# Patient Record
Sex: Female | Born: 1945
Health system: Southern US, Community
[De-identification: ages and names within clinical notes are randomized; demographics above are authoritative.]

## PROBLEM LIST (undated history)

## (undated) DIAGNOSIS — R1310 Dysphagia, unspecified: Secondary | ICD-10-CM

## (undated) DIAGNOSIS — F32A Depression, unspecified: Secondary | ICD-10-CM

## (undated) DIAGNOSIS — F0390 Unspecified dementia without behavioral disturbance: Secondary | ICD-10-CM

## (undated) DIAGNOSIS — E119 Type 2 diabetes mellitus without complications: Secondary | ICD-10-CM

## (undated) DIAGNOSIS — I639 Cerebral infarction, unspecified: Secondary | ICD-10-CM

## (undated) DIAGNOSIS — G2401 Drug induced subacute dyskinesia: Secondary | ICD-10-CM

## (undated) DIAGNOSIS — D649 Anemia, unspecified: Secondary | ICD-10-CM

## (undated) DIAGNOSIS — F419 Anxiety disorder, unspecified: Secondary | ICD-10-CM

## (undated) DIAGNOSIS — R0989 Other specified symptoms and signs involving the circulatory and respiratory systems: Secondary | ICD-10-CM

## (undated) DIAGNOSIS — G912 (Idiopathic) normal pressure hydrocephalus: Secondary | ICD-10-CM

## (undated) DIAGNOSIS — F329 Major depressive disorder, single episode, unspecified: Secondary | ICD-10-CM

## (undated) DIAGNOSIS — W19XXXA Unspecified fall, initial encounter: Secondary | ICD-10-CM

## (undated) DIAGNOSIS — R413 Other amnesia: Secondary | ICD-10-CM

## (undated) DIAGNOSIS — I8290 Acute embolism and thrombosis of unspecified vein: Secondary | ICD-10-CM

## (undated) DIAGNOSIS — R4702 Dysphasia: Secondary | ICD-10-CM

## (undated) DIAGNOSIS — H3320 Serous retinal detachment, unspecified eye: Secondary | ICD-10-CM

## (undated) DIAGNOSIS — R296 Repeated falls: Secondary | ICD-10-CM

## (undated) DIAGNOSIS — I10 Essential (primary) hypertension: Secondary | ICD-10-CM

## (undated) DIAGNOSIS — K219 Gastro-esophageal reflux disease without esophagitis: Secondary | ICD-10-CM

## (undated) DIAGNOSIS — E785 Hyperlipidemia, unspecified: Secondary | ICD-10-CM

## (undated) DIAGNOSIS — Z85118 Personal history of other malignant neoplasm of bronchus and lung: Secondary | ICD-10-CM

## (undated) DIAGNOSIS — J449 Chronic obstructive pulmonary disease, unspecified: Secondary | ICD-10-CM

## (undated) HISTORY — PX: VENTRICULOPERITONEAL SHUNT: SHX204

## (undated) HISTORY — DX: Acute embolism and thrombosis of unspecified vein: I82.90

## (undated) HISTORY — PX: LUNG REMOVAL, PARTIAL: SHX233

## (undated) HISTORY — DX: Depression, unspecified: F32.A

## (undated) HISTORY — DX: Anxiety disorder, unspecified: F41.9

## (undated) HISTORY — DX: Type 2 diabetes mellitus without complications: E11.9

## (undated) HISTORY — DX: Serous retinal detachment, unspecified eye: H33.20

## (undated) HISTORY — DX: Major depressive disorder, single episode, unspecified: F32.9

## (undated) HISTORY — DX: Other amnesia: R41.3

## (undated) HISTORY — DX: Unspecified dementia, unspecified severity, without behavioral disturbance, psychotic disturbance, mood disturbance, and anxiety: F03.90

## (undated) HISTORY — DX: Personal history of other malignant neoplasm of bronchus and lung: Z85.118

## (undated) HISTORY — DX: Cerebral infarction, unspecified: I63.9

## (undated) HISTORY — DX: Dysphasia: R47.02

## (undated) HISTORY — DX: Hyperlipidemia, unspecified: E78.5

## (undated) HISTORY — DX: Chronic obstructive pulmonary disease, unspecified: J44.9

## (undated) HISTORY — DX: Other specified symptoms and signs involving the circulatory and respiratory systems: R09.89

## (undated) HISTORY — DX: (Idiopathic) normal pressure hydrocephalus: G91.2

---

## 2012-12-01 LAB — HM PAP SMEAR: HM Pap smear: NEGATIVE

## 2016-12-01 DIAGNOSIS — G912 (Idiopathic) normal pressure hydrocephalus: Secondary | ICD-10-CM

## 2016-12-01 HISTORY — DX: (Idiopathic) normal pressure hydrocephalus: G91.2

## 2016-12-01 LAB — HM MAMMOGRAPHY: HM MAMMO: NORMAL (ref 0–4)

## 2016-12-01 LAB — HM DIABETES EYE EXAM

## 2016-12-09 HISTORY — PX: VENTRICULOPERITONEAL SHUNT: SHX204

## 2016-12-10 DIAGNOSIS — F17200 Nicotine dependence, unspecified, uncomplicated: Secondary | ICD-10-CM | POA: Insufficient documentation

## 2017-06-01 LAB — HM COLONOSCOPY

## 2017-09-07 ENCOUNTER — Telehealth: Payer: Self-pay | Admitting: Primary Care

## 2017-09-07 NOTE — Telephone Encounter (Signed)
Pt daughter called about wanting to speak about pt dementia and go over somethings and concerns. If it's spoken about in front of pt things might not go well.   Laporsche Hoeger is daughter @ [phone]. Thank you!

## 2017-09-07 NOTE — Telephone Encounter (Signed)
Please notify patient's daughter [name] that I am very happy to speak with her and will be in touch with her before the end of this week.

## 2017-09-07 NOTE — Telephone Encounter (Signed)
Patient's new patient appointment on 09/14/2017

## 2017-09-08 NOTE — Telephone Encounter (Signed)
Spoken and notified patient's daughter of [name]'s comments. Patient's daughter verbalized understanding.

## 2017-09-08 NOTE — Telephone Encounter (Signed)
Tried to call patient's daughter but went to voicemail. No DPR yet so could not leave message.

## 2017-09-10 ENCOUNTER — Telehealth: Payer: Self-pay | Admitting: Primary Care

## 2017-09-10 ENCOUNTER — Ambulatory Visit: Payer: Self-pay | Admitting: Primary Care

## 2017-09-10 NOTE — Telephone Encounter (Signed)
Called patient's daughter and spoke with her regarding her concerns.

## 2017-09-14 ENCOUNTER — Encounter: Payer: Self-pay | Admitting: Primary Care

## 2017-09-14 ENCOUNTER — Ambulatory Visit (INDEPENDENT_AMBULATORY_CARE_PROVIDER_SITE_OTHER): Payer: Medicare Other | Admitting: Primary Care

## 2017-09-14 ENCOUNTER — Encounter (INDEPENDENT_AMBULATORY_CARE_PROVIDER_SITE_OTHER): Payer: Self-pay

## 2017-09-14 VITALS — BP 120/66 | HR 80 | Temp 98.8°F | Ht 61.0 in | Wt 107.1 lb

## 2017-09-14 DIAGNOSIS — G912 (Idiopathic) normal pressure hydrocephalus: Secondary | ICD-10-CM | POA: Diagnosis not present

## 2017-09-14 DIAGNOSIS — J449 Chronic obstructive pulmonary disease, unspecified: Secondary | ICD-10-CM

## 2017-09-14 DIAGNOSIS — Z8673 Personal history of transient ischemic attack (TIA), and cerebral infarction without residual deficits: Secondary | ICD-10-CM | POA: Insufficient documentation

## 2017-09-14 DIAGNOSIS — F329 Major depressive disorder, single episode, unspecified: Secondary | ICD-10-CM | POA: Diagnosis not present

## 2017-09-14 DIAGNOSIS — E785 Hyperlipidemia, unspecified: Secondary | ICD-10-CM | POA: Insufficient documentation

## 2017-09-14 DIAGNOSIS — R1310 Dysphagia, unspecified: Secondary | ICD-10-CM | POA: Insufficient documentation

## 2017-09-14 DIAGNOSIS — F0390 Unspecified dementia without behavioral disturbance: Secondary | ICD-10-CM

## 2017-09-14 DIAGNOSIS — I639 Cerebral infarction, unspecified: Secondary | ICD-10-CM | POA: Diagnosis not present

## 2017-09-14 DIAGNOSIS — I10 Essential (primary) hypertension: Secondary | ICD-10-CM | POA: Insufficient documentation

## 2017-09-14 DIAGNOSIS — F419 Anxiety disorder, unspecified: Secondary | ICD-10-CM | POA: Diagnosis not present

## 2017-09-14 NOTE — Assessment & Plan Note (Signed)
Surgical intervention in January 2018. Referral placed to neurology for maintenance and evaluation. Continue BP control.

## 2017-09-14 NOTE — Assessment & Plan Note (Signed)
Underwent endoscopy in May which showed dilation and otherwise normal exam. Did not get to discuss this today given numerous other medical conditions. Not currently on PPI, patient had no complaints of dysphagia today.

## 2017-09-14 NOTE — Patient Instructions (Addendum)
Stop by the front desk and speak with either Rosaria Ferries or [name] regarding your referral to Pulmonology and Neurology.  Continue losartan and amlodipine for high blood pressure.  Continue simvastatin 40 mg for cholesterol for now. I'll be in touch with you once I receive your lab results.   Wean off of the Trintillex. Start by taking 1 tablet every other day for 2 weeks, then 1 tablet every 3 days for 2 weeks then stop.  Continue mirtazapine and buspirone for now.     Schedule a lab only appointment to return fasting for cholesterol.   Schedule a follow up visit in 2 months for re-evaluation.  It was a pleasure to meet you today! Please don't hesitate to call me with any questions. Welcome to [facility]!

## 2017-09-14 NOTE — Assessment & Plan Note (Signed)
CVA in 2000. Managed on Plavix for ?arterial vs venous thrombosis to lower extremity.  Continue lipid, BP control and Plavix.

## 2017-09-14 NOTE — Assessment & Plan Note (Signed)
Lipid panel pending today. Will consider switching from Simvastatin to Crestor given history of myalgias and potential medication interactions.

## 2017-09-14 NOTE — Assessment & Plan Note (Signed)
Alert and oriented x 3 today, does seem mixed up regarding her medical conditions. She does have numerous medical conditions so this isn't too alarming. Referral placed to neurology for further evaluation.

## 2017-09-14 NOTE — Assessment & Plan Note (Signed)
Stable in the office today, continue amlodipine and losartan. CMP pending.

## 2017-09-14 NOTE — Assessment & Plan Note (Signed)
Managed on home oxygen for which she uses PRN. Room air saturation of 94% at rest. Patient and daughter seem confused on when to wear oxygen. Failed numerous inhalers for COPD treatment, using albuterol inhaler 3-5 times weekly. Referral placed to pulmonology for further management and evaluation.

## 2017-09-14 NOTE — Assessment & Plan Note (Signed)
Seems more anxious than depressed. Do agree that she is over medicated with antidepressant/antianxiety treatment. Will slowly wean off of Trintellix as she's not noticed improvement. Continue Buspar and Remeron for now.

## 2017-09-14 NOTE — Progress Notes (Signed)
Subjective:    Patient ID: Amanda Porter, female    DOB: Apr 05, 1946, 71 y.o.   MRN: [id number]  HPI  Amanda Porter is a 71 year old female who presents today to establish care and discuss the problems mentioned below. Will obtain old records.  1) Hyperlipidemia: Currently managed on Simvastatin 40 mg. Amanda last cholesterol was check was "a little high". Not sure what this was. She's compliant to Simvastatin but doesn't like taking due to muscle aches to Amanda lower extremities.   2) Anxiety and Depression: Currently managed on mirtazapine 7.5 mg, Trintellix 10 mg, and buspirone 7.5 mg BID. She was originally prescribed buspirone four times daily but has weaned down to twice daily dosing. Initiated on Trintellix three months ago.   She has no problem falling asleep but will wake back up most every night, sometimes has trouble falling back asleep. She does watch TV before bed and will typically keep the TV on. Over the last three weeks Amanda TV has been on throughout the evening. She was previously managed on Valium for which she's not had since Summer 2018.  Amanda Porter is worried that Amanda mother is over medicated as she's seen cognitive  changes over the last year. They've already weaned down Amanda buspirone from QID to BID and are interested in removing other antidepressants. She hasn't noticed any difference in stress and anxiety since starting Trintellix 3 months ago.   3) Essential Hypertension: Currently managed on losartan 100 mg and amlodipine 5 mg. Amanda BP in the office today is 120/66. She's checking Amanda BP at home and is getting readings of 118-120's/60's. She does experience intermittent dizziness with positional changes, also doesn't wear Amanda home oxygen much.   4) CVA/Thrombosis: Also with history of TIA's and what seems to be an arterial blood clot. Stroke was in 2000. Currently managed on Plavix and Simvastatin. She endorses muscle aches and stiffness to Amanda lower extremities for  years. She denies residual weakness, paraesthesias. Amanda Porter has noted a decrease in short term memory and inability to manage Amanda own health care.   5) COPD: Currently prescribed oxygen for which she started in January 2018. She wears Amanda oxygen intermittently when "going out" to visit family. She very seldom wears this oxygen at home. Patient denies shortness of breath, but Amanda Porter notices wheezing and shortness of breath when she's not wearing Amanda oxygen. She was following with pulmonology annually in [place], [place]. Previously managed on Advair without improvement. Also prescribed other inhalers for which either she could not afford or did not help.   6) Dementia: Evaluated in May 2017 per Neurology. History of hydrocephalus with shunt placement in January 2018. She was once managed on Aricept but this caused Amanda to feel dizzy and nauseated. She took this for less than three months.   7) Normal Pressure Hydrocephalus: Symptoms of altered mental status urinary incontinence, gait trouble. MRI reveled normal pressure hydrocephalus. History of VP shunt placement on 12/09/16 and has a Medtronic Sophysa Valve set to 70 mmHg. Last follow up visit was in March 2018 with Amanda neurosurgeon in [place], [place]. She has not since followed up with Neurology as recommended.   Review of Systems  HENT: Positive for trouble swallowing.   Eyes: Negative for visual disturbance.  Respiratory: Negative for shortness of breath.   Cardiovascular: Negative for chest pain.  Gastrointestinal: Negative for abdominal pain.       Dysphagia  Genitourinary:       Incontinence  Musculoskeletal: Positive for myalgias.  Skin: Negative for color change.  Neurological: Positive for dizziness.  Psychiatric/Behavioral:       See HPI       Past Medical History:  Diagnosis Date  . Anxiety and depression   . COPD (chronic obstructive pulmonary disease) ([place])   . CVA (cerebral vascular accident) ([place])   . Dementia   . Dysphasia    . Hyperlipidemia   . Normal pressure hydrocephalus 2018  . Thrombosis    Arterial to lower extremity?     Social History   Social History  . Marital status: Divorced    Spouse name: N/A  . Number of children: N/A  . Years of education: N/A   Occupational History  . Not on file.   Social History Main Topics  . Smoking status: Not on file  . Smokeless tobacco: Not on file  . Alcohol use Not on file  . Drug use: Unknown  . Sexual activity: Not on file   Other Topics Concern  . Not on file   Social History Narrative   Moved from [place].   Lives with Porter.       Past Surgical History:  Procedure Laterality Date  . LUNG REMOVAL, PARTIAL     left upper lobe  . VENTRICULOPERITONEAL SHUNT  12/09/2016    No family history on file.  Allergies  Allergen Reactions  . Neosporin  [Neomycin-Polymyxin-Gramicidin] Rash    (Neosporin)    No current outpatient prescriptions on file prior to visit.   No current facility-administered medications on file prior to visit.     BP 120/66   Pulse 80   Temp 98.8 F (37.1 C) (Oral)   Ht 5\' 1"  (1.549 m)   Wt 107 lb 1.9 oz (48.6 kg)   SpO2 98%   BMI 20.24 kg/m    Objective:   Physical Exam  Constitutional: She is oriented to person, place, and time. She appears well-nourished.  Neck: Neck supple.  Cardiovascular: Normal rate and regular rhythm.   Pulmonary/Chest: Effort normal and breath sounds normal.  Abdominal: Soft. Bowel sounds are normal.  Neurological: She is alert and oriented to person, place, and time.  Skin: Skin is warm and dry.  Psychiatric: She has a normal mood and affect.          Assessment & Plan:

## 2017-09-15 ENCOUNTER — Other Ambulatory Visit: Payer: Self-pay | Admitting: Primary Care

## 2017-09-15 ENCOUNTER — Other Ambulatory Visit (INDEPENDENT_AMBULATORY_CARE_PROVIDER_SITE_OTHER): Payer: Medicare Other

## 2017-09-15 DIAGNOSIS — E785 Hyperlipidemia, unspecified: Secondary | ICD-10-CM

## 2017-09-15 LAB — COMPREHENSIVE METABOLIC PANEL
ALBUMIN: 4 g/dL (ref 3.5–5.2)
ALK PHOS: 61 U/L (ref 39–117)
ALT: 12 U/L (ref 0–35)
AST: 15 U/L (ref 0–37)
BUN: 18 mg/dL (ref 6–23)
CALCIUM: 9.6 mg/dL (ref 8.4–10.5)
CHLORIDE: 105 meq/L (ref 96–112)
CO2: 29 mEq/L (ref 19–32)
Creatinine, Ser: 0.79 mg/dL (ref 0.40–1.20)
GFR: 91.86 mL/min (ref >60.00)
Glucose, Bld: 151 mg/dL — ABNORMAL HIGH (ref 70–99)
POTASSIUM: 3.6 meq/L (ref 3.5–5.1)
SODIUM: 143 meq/L (ref 135–145)
TOTAL PROTEIN: 7.3 g/dL (ref 6.0–8.3)
Total Bilirubin: 0.2 mg/dL (ref 0.2–1.2)

## 2017-09-15 LAB — LIPID PANEL
CHOLESTEROL: 157 mg/dL (ref 0–200)
HDL: 58.8 mg/dL (ref >39.00)
LDL CALC: 77 mg/dL (ref 0–99)
NonHDL: 97.97
Total CHOL/HDL Ratio: 3
Triglycerides: 105 mg/dL (ref 0.0–149.0)
VLDL: 21 mg/dL (ref 0.0–40.0)

## 2017-09-15 MED ORDER — ROSUVASTATIN CALCIUM 5 MG PO TABS: 5.0000 mg | ORAL_TABLET | Freq: Every evening | ORAL | 2 refills | Status: DC

## 2017-09-17 ENCOUNTER — Encounter: Payer: Self-pay | Admitting: Primary Care

## 2017-09-17 ENCOUNTER — Telehealth: Payer: Self-pay

## 2017-09-17 DIAGNOSIS — J449 Chronic obstructive pulmonary disease, unspecified: Secondary | ICD-10-CM

## 2017-09-17 MED ORDER — BUDESONIDE-FORMOTEROL FUMARATE 160-4.5 MCG/ACT IN AERO: 2.0000 | INHALATION_SPRAY | Freq: Two times a day (BID) | RESPIRATORY_TRACT | 1 refills | Status: DC

## 2017-09-17 NOTE — Telephone Encounter (Signed)
[name] called back and pt was seen 07/15/17; pt is waiting on pulmonology referral; pt breathing is worse and [name] said discussed with Anda Kraft about starting Advair or similar med or long acting inhaler until can see pulmonologist. Even after using the rescue inhaler pt has wheezing lt lower lobe. [name] request cb. [facility].

## 2017-09-17 NOTE — Telephone Encounter (Signed)
Spoken to patient's daughter. Patient have tried Advair before they realize patient have dimetria that she was using as a rescue inhaler.  Patient's daughter stated that they have not tried any other inhalers. Would like to try another inhaler for patient. If Anda Kraft can send Symbicort or something to the pharmacy.

## 2017-09-17 NOTE — Telephone Encounter (Signed)
Noted, Rx for Symbicort sent to pharmacy. Inhale 2 puffs BID everyday. Make sure she rinses her mouth after each use.

## 2017-09-17 NOTE — Telephone Encounter (Signed)
Message left for patient to return my call.  

## 2017-09-17 NOTE — Telephone Encounter (Signed)
I have in my notes from her visit that she's not done well on Advair and several other inhalers.  Has she tried any other inhalers in the past? Symbicort? Dulera? Breo?

## 2017-09-17 NOTE — Telephone Encounter (Signed)
Spoken and notified patient's daughter of [name]'s comments. Patient's daughter verbalized understanding.

## 2017-09-17 NOTE — Telephone Encounter (Signed)
Amanda Porter pts daughter (DPR signed) left v/m requesting cb about med question and also question about pts health. Left v/m requesting cb.

## 2017-09-23 ENCOUNTER — Encounter: Payer: Self-pay | Admitting: Primary Care

## 2017-09-23 NOTE — Progress Notes (Deleted)
Patient is a 71 year old female with history including hypertension, TIA, COPD, normal pressure hydrocephalus.  The patient currently wears oxygen trouble falling and staying asleep.  She was previously seen by a pulmonologist in [place], and maintained on [place].

## 2017-09-25 ENCOUNTER — Institutional Professional Consult (permissible substitution): Payer: Medicare Other | Admitting: Internal Medicine

## 2017-09-30 ENCOUNTER — Ambulatory Visit
Admission: RE | Admit: 2017-09-30 | Discharge: 2017-09-30 | Disposition: A | Discharge status: Home / Self Care | Payer: Medicare Other | Source: Ambulatory Visit | Attending: Internal Medicine | Admitting: Internal Medicine

## 2017-09-30 ENCOUNTER — Ambulatory Visit (INDEPENDENT_AMBULATORY_CARE_PROVIDER_SITE_OTHER): Payer: Medicare Other | Admitting: Internal Medicine

## 2017-09-30 ENCOUNTER — Encounter: Payer: Self-pay | Admitting: Internal Medicine

## 2017-09-30 VITALS — BP 112/56 | HR 104 | Resp 16 | Ht 61.0 in | Wt 110.0 lb

## 2017-09-30 DIAGNOSIS — J9811 Atelectasis: Secondary | ICD-10-CM | POA: Insufficient documentation

## 2017-09-30 DIAGNOSIS — J441 Chronic obstructive pulmonary disease with (acute) exacerbation: Secondary | ICD-10-CM

## 2017-09-30 DIAGNOSIS — I700 Atherosclerosis of aorta: Secondary | ICD-10-CM | POA: Diagnosis not present

## 2017-09-30 MED ORDER — FLUTICASONE-UMECLIDIN-VILANT 100-62.5-25 MCG/INH IN AEPB: 1.0000 | INHALATION_SPRAY | Freq: Every day | RESPIRATORY_TRACT | 5 refills | Status: DC

## 2017-09-30 MED ORDER — FLUTICASONE-UMECLIDIN-VILANT 100-62.5-25 MCG/INH IN AEPB: 1.0000 | INHALATION_SPRAY | Freq: Every day | RESPIRATORY_TRACT | 0 refills | Status: AC

## 2017-09-30 MED ORDER — AZITHROMYCIN 250 MG PO TABS: ORAL_TABLET | ORAL | 0 refills | Status: DC

## 2017-09-30 NOTE — Patient Instructions (Addendum)
--  Continue walking 20 minutes every day, SLOW DOWN your pace so that you can walk further! --Take antibiotics as prescribed.  --I prescribed Trelegy inhaler to be used once daily, if it is too expensive go back to using Symbicort 2 puffs twice daily. --Use ventolin as needed.  --Call back if not feeling better in 2-3 weeks.   --Will need to obtain records and PFT from your previous pulmonary doctor.

## 2017-09-30 NOTE — Progress Notes (Addendum)
[facility] Consultation      Assessment and Plan:  71 year old female with history of lung cancer and lobectomy approximately 20 years ago, now with emphysema and progressive dyspnea on exertion.  COPD with acute bronchitis and exacerbation. -Severe oxygen dependent emphysema. -We will try and Trilogy inhaler, if not affordable then continue Symbicort. - Prescribed a course of azithromycin for acute bronchitis/COPD exacerbation. -We will check a baseline chest x-ray today. -We will obtain records from her previous pulmonologist including previous pulmonary function testing.  Dyspnea on exertion with deconditioning. -Dyspnea on exertion is likely multifactorial from severe COPD, deconditioning, previous lobectomy. -Discussed the importance of increasing her activity level and keeping her energy level up. -Asked her to do 20 minutes of walking at a slow pace daily.  Nicotine abuse. - Discussed the importance of smoking cessation, spent greater than 3 minutes in discussion.  Dementia. -She has some difficulty remembering inhalers and and her inhaler regimen.  She also has some difficulty in coordinating with HFA inhalers. -Spent time and training the patient to use Symbicort inhaler.  I did advise her to switch to Trilogy inhaler which I think would be easier for her to use, but it may not be affordable.  A prescription was sent for Trilogy inhaler if it is too expensive I asked her to switch back to Symbicort and use Ventolin as needed.  Orders Placed This Encounter  Procedures  . DG Chest 2 View   Meds ordered this encounter  Medications  . azithromycin (ZITHROMAX) 250 MG tablet    Sig: Take 2 tabs first day, then once daily until gone.    Dispense:  6 each    Refill:  0  . Fluticasone-Umeclidin-Vilant (TRELEGY ELLIPTA) 100-62.5-25 MCG/INH AEPB    Sig: Inhale 1 Inhaler into the lungs daily.    Dispense:  1 each    Refill:  5  .  Fluticasone-Umeclidin-Vilant (TRELEGY ELLIPTA) 100-62.5-25 MCG/INH AEPB    Sig: Inhale 1 puff into the lungs daily.    Dispense:  1 each    Refill:  0    Order Specific Question:   Lot Number?    Answer:   [id number]    Order Specific Question:   Manufacturer?    Answer:   [employer] [[id number]]    Return in about 3 months (around 12/31/2017).   Date: 09/30/2017  MRN# [id number] Amanda Porter [birth year]    Amanda Porter is a 70 y.o. old female seen in consultation for chief complaint of:    Chief Complaint  Patient presents with  . Advice Only    Referred by Amanda Porter for COPD eval:  . Cough    with green-yellow sputum  . Shortness of Breath    pt on 2L 02 thru APS  . Wheezing    several weeks    HPI:   [name] is a 71 y.o. year old female with  history of dysphagia with solid & liqud. She reports two separate choking episodes. She has a history of normal pressure hydrocephalus s/p shunt placement on 12/09/16. She also has history of multiple TIAs. She moved here recently from [place] to be with her daughter due to a diagnosis of dementia and decline in her function over the past year.   She is here today with her daughter who provides much of the history. SHe would like to establish with a local pulmonary doc. She is on 2L at all times at home, but she does wear it all  the time. She does not currently drive currently, she does ok in a grocery store but takes her time, her daughter notes that she gets winded when wakling around the house. She rarely the house, and has been having a steady decline over the past year.  She has been diagnosed with COPD, she was just started on symbicort but is using it as needed because she gets confused. She is using ventolin rarely.  She notes that she gets out of breath with walking over the past 3 months.  No occupational exposures. She smokes intermittently, last about 1 month ago.  Today she is here because she has been coughing  up green sputum and feeling more congested. She has flareups of coughing about twice per year.   She had part of her left lung in 2000, for lung cancer, no chemo and had no recurrence.   Desat walk 09/30/17; baseline sat was 95% and HR 83. After walking 360 feet sat was 86% and HR 120, added oxygen at 2L and sat increased to 92%  Addendum 10/27/17; Outside records reviewed Spirometry 01/12/17 FVC equals 65% of predicted, 10% improvement with bronchodilator.  FEV1 equals 45% of predicted, 6% improvement with bronchodilator.  FEV FVC ratio is 53%.  Overall consistent with severe emphysema without significant improvement with bronchodilator. Office visit 01/12/17; notes made of previous lobectomy June of the year 2000.  Left femoral embolectomy 1998 noted that PFTs have been stable since approximately 2011 when her FEV1 was 50%.  She was given a trial of Trilogy inhaler with thought given to possible de-escalation in the future.  PMHX:   Past Medical History:  Diagnosis Date  . Anxiety and depression   . COPD (chronic obstructive pulmonary disease) ([place])   . CVA (cerebral vascular accident) ([place])   . Dementia   . Dysphasia   . History of lung cancer   . Hyperlipidemia   . Normal pressure hydrocephalus 2018  . Retinal detachment    Right  . Thrombosis    Arterial to lower extremity?   Surgical Hx:  Past Surgical History:  Procedure Laterality Date  . LUNG REMOVAL, PARTIAL     left upper lobe  . VENTRICULOPERITONEAL SHUNT  12/09/2016   Family Hx:  History reviewed. No pertinent family history. Social Hx:   Social History  Substance Use Topics  . Smoking status: Former Smoker    Packs/day: 0.50    Quit date: 55  . Smokeless tobacco: Never Used  . Alcohol use No   Medication:    Current Outpatient Prescriptions:  .  albuterol (VENTOLIN HFA) 108 (90 Base) MCG/ACT inhaler, Inhale 2 puffs into the lungs every 4 (four) hours as needed for wheezing or shortness of breath. , Disp:  , Rfl:  .  amLODipine (NORVASC) 5 MG tablet, Take 5 mg by mouth daily. , Disp: , Rfl:  .  budesonide-formoterol (SYMBICORT) 160-4.5 MCG/ACT inhaler, Inhale 2 puffs into the lungs 2 (two) times daily., Disp: 1 Inhaler, Rfl: 1 .  busPIRone (BUSPAR) 7.5 MG tablet, TAKE 1 ORAL TABLET TWICE DAILY FOR ANXIETY., Disp: , Rfl:  .  clopidogrel (PLAVIX) 75 MG tablet, Take 75 mg by mouth., Disp: , Rfl:  .  losartan (COZAAR) 100 MG tablet, Take 100 mg by mouth daily. , Disp: , Rfl:  .  Melatonin 5 MG TABS, Take 5 mg by mouth at bedtime. , Disp: , Rfl:  .  mirtazapine (REMERON) 7.5 MG tablet, Take 1 tablet 30 mintues before daily at bedtime,  Disp: , Rfl:  .  Multiple Vitamin (MULTIVITAMIN) capsule, Take 1 capsule by mouth daily., Disp: , Rfl:  .  Omega-3 Fatty Acids (FISH OIL PO), Take 520 mg by mouth., Disp: , Rfl:  .  OXYGEN, 2 L continuous. , Disp: , Rfl:  .  rosuvastatin (CRESTOR) 5 MG tablet, Take 1 tablet (5 mg total) by mouth every evening., Disp: 90 tablet, Rfl: 2   Allergies:  Lexapro [escitalopram oxalate] and Neosporin  [neomycin-polymyxin-gramicidin]  Review of Systems: Gen:  Denies  fever, sweats, chills HEENT: Denies blurred vision, double vision. bleeds, sore throat Cvc:  No dizziness, chest pain. Resp:   Denies cough or sputum production, shortness of breath Gi: Denies swallowing difficulty, stomach pain. Gu:  Denies bladder incontinence, burning urine Ext:   No Joint pain, stiffness. Skin: No skin rash,  hives  Endoc:  No polyuria, polydipsia. Psych: No depression, insomnia. Other:  All other systems were reviewed with the patient and were negative other that what is mentioned in the HPI.   Physical Examination:   VS: BP (!) 112/56 (BP Location: Left Arm, Cuff Size: Normal)   Pulse (!) 104   Resp 16   Ht 5\' 1"  (1.549 m)   Wt 110 lb (49.9 kg)   SpO2 93%   BMI 20.78 kg/m   General Appearance: No distress  Neuro:without focal findings,  speech normal,  HEENT: PERRLA, EOM  intact.   Pulmonary: normal breath sounds, No wheezing.  CardiovascularNormal S1,S2.  No m/r/g.   Abdomen: Benign, Soft, non-tender. Renal:  No costovertebral tenderness  GU:  No performed at this time. Endoc: No evident thyromegaly, no signs of acromegaly. Skin:   warm, no rashes, no ecchymosis  Extremities: normal, no cyanosis, clubbing.  Other findings:    LABORATORY PANEL:   CBC No results for input(s): WBC, HGB, HCT, PLT in the last 168 hours. ------------------------------------------------------------------------------------------------------------------  Chemistries  No results for input(s): NA, K, CL, CO2, GLUCOSE, BUN, CREATININE, CALCIUM, MG, AST, ALT, ALKPHOS, BILITOT in the last 168 hours.  Invalid input(s): GFRCGP ------------------------------------------------------------------------------------------------------------------  Cardiac Enzymes No results for input(s): TROPONINI in the last 168 hours. ------------------------------------------------------------  RADIOLOGY:  No results found.     Thank  you for the consultation and for allowing [facility] to assist in the care of your patient. Our recommendations are noted above.  Please contact us if we can be of further service.   Marda Stalker, MD.  Board Certified in Internal Medicine, Pulmonary Medicine, [place], and Sleep Medicine.  [facility] Office Number: [phone]  Patricia Pesa, M.D.  Merton Border, M.D  09/30/2017

## 2017-10-04 ENCOUNTER — Encounter: Payer: Self-pay | Admitting: Primary Care

## 2017-10-05 ENCOUNTER — Encounter: Payer: Self-pay | Admitting: Primary Care

## 2017-10-05 DIAGNOSIS — F419 Anxiety disorder, unspecified: Principal | ICD-10-CM

## 2017-10-05 DIAGNOSIS — F329 Major depressive disorder, single episode, unspecified: Secondary | ICD-10-CM

## 2017-10-05 NOTE — Telephone Encounter (Addendum)
Ok to refill? Medication have not been prescribe by Amanda Porter. Patient was first seen on 09/14/2017

## 2017-10-06 MED ORDER — MIRTAZAPINE 7.5 MG PO TABS: 7.5000 mg | ORAL_TABLET | Freq: Every day | ORAL | 2 refills | Status: DC

## 2017-11-11 ENCOUNTER — Telehealth: Payer: Self-pay | Admitting: Primary Care

## 2017-11-11 NOTE — Telephone Encounter (Signed)
Copied from [place]. Topic: Quick Communication - See Telephone Encounter >> Nov 11, 2017  4:57 PM Boyd Kerbs wrote: CRM for notification. See Telephone encounter for:  Patient is needing prescription [place] Amanda Porter)  Pharmacy [facility], [place] is trying to get authorization with no response. Patient is out and this is needed today. 11/11/17.

## 2017-11-12 ENCOUNTER — Other Ambulatory Visit: Payer: Self-pay | Admitting: *Deleted

## 2017-11-12 ENCOUNTER — Telehealth: Payer: Self-pay | Admitting: Primary Care

## 2017-11-12 MED ORDER — LOSARTAN POTASSIUM 100 MG PO TABS: 100.0000 mg | ORAL_TABLET | Freq: Every day | ORAL | 0 refills | Status: DC

## 2017-11-12 NOTE — Telephone Encounter (Signed)
Called pt to reschedule her 12/17 appointment to 12/27.

## 2017-11-12 NOTE — Progress Notes (Signed)
Pt appt for 11/16/17 with [name]; cozaar 100 mg by mouth daily refill sent

## 2017-11-12 NOTE — Telephone Encounter (Signed)
[facility], and can be picked up at [place] across from [employer]; left message on voice mail [phone].

## 2017-11-12 NOTE — Telephone Encounter (Signed)
Pt of Amanda Bossier, NP; sent refill x 1 for losarton; pt has appt next week; see CRM [phone]

## 2017-11-16 ENCOUNTER — Ambulatory Visit: Payer: Medicare Other | Admitting: Primary Care

## 2017-11-20 ENCOUNTER — Ambulatory Visit: Payer: Medicare Other | Admitting: Neurology

## 2017-11-20 ENCOUNTER — Encounter: Payer: Self-pay | Admitting: Neurology

## 2017-11-20 VITALS — BP 142/60 | HR 89 | Ht 62.0 in | Wt 114.0 lb

## 2017-11-20 DIAGNOSIS — R0989 Other specified symptoms and signs involving the circulatory and respiratory systems: Secondary | ICD-10-CM | POA: Diagnosis not present

## 2017-11-20 DIAGNOSIS — G912 (Idiopathic) normal pressure hydrocephalus: Secondary | ICD-10-CM | POA: Diagnosis not present

## 2017-11-20 DIAGNOSIS — R413 Other amnesia: Secondary | ICD-10-CM | POA: Insufficient documentation

## 2017-11-20 DIAGNOSIS — G919 Hydrocephalus, unspecified: Secondary | ICD-10-CM

## 2017-11-20 HISTORY — DX: Other amnesia: R41.3

## 2017-11-20 HISTORY — DX: Other specified symptoms and signs involving the circulatory and respiratory systems: R09.89

## 2017-11-20 NOTE — Patient Instructions (Signed)
   We will get CT of the brain.

## 2017-11-20 NOTE — Progress Notes (Signed)
Reason for visit: NPH, memory disturbance  Referring physician: Dr. Moishe Spice is a 71 y.o. female  History of present illness:  Ms. Peto is a 71 year old right-handed black female with a history of a memory problem that developed around May 2017.  The patient was initially tried on Aricept, but she could not tolerate that medication.  Eventually she was found to have normal pressure hydrocephalus although her gait instability was never significant.  The patient did have some difficulty with urinary urgency and incontinence.  The patient began wearing adult diapers.  The patient had a VP shunt placement in January 2018 by Dr. Alycia Patten at [facility].  The patient appears to have gained some benefit with bladder control, she continues to have some memory problems, her daughter who accompanies her today believes that there may have been some stabilization of the memory since the VP shunt was placed.  The patient has minimal gait instability.  The patient does not use a cane for ambulation.  She does have a history of a stroke in 2000, she has some left-sided weakness as a residual from this, she has some tightness and stiffness involving the left leg at times.  The patient also reports some problems with chronic insomnia, she will wake up in the middle the night and cannot get back to sleep.  She has a prior history of lung cancer, status post resection, and a history of DVT in the past.  The patient denies any headache, visual field changes, she may have some occasional dizziness.  She has had some problems with choking at times and has been evaluated for this.  She reports some short-term memory issues, difficulty with memory names for people.  She currently does not operate a motor vehicle.  She needs some assistance with keeping up with medications, she can keep up with her own appointments and her finances.  She has moved to this area recently, she comes in for  establishment with a neurologist.  Past Medical History:  Diagnosis Date  . Anxiety   . Anxiety and depression   . COPD (chronic obstructive pulmonary disease) ([place])   . CVA (cerebral vascular accident) ([place])   . Dementia   . Dysphasia   . History of lung cancer   . Hyperlipidemia   . Normal pressure hydrocephalus 2018  . Retinal detachment    Right  . Thrombosis    Arterial to lower extremity?    Past Surgical History:  Procedure Laterality Date  . LUNG REMOVAL, PARTIAL     left upper lobe  . VENTRICULOPERITONEAL SHUNT  12/09/2016    Family History  Problem Relation Age of Onset  . Pneumonia Mother   . Cancer Father     Social history:  reports that she quit smoking about 64 years ago. She smoked 0.50 packs per day. she has never used smokeless tobacco. She reports that she does not drink alcohol or use drugs.  Medications:  Prior to Admission medications   Medication Sig Start Date End Date Taking? Authorizing Provider  albuterol (VENTOLIN HFA) 108 (90 Base) MCG/ACT inhaler Inhale 2 puffs into the lungs every 4 (four) hours as needed for wheezing or shortness of breath.  10/30/16  Yes [provider]  amLODipine (NORVASC) 5 MG tablet Take 5 mg by mouth daily.  10/14/16  Yes [provider]  busPIRone (BUSPAR) 7.5 MG tablet TAKE 1 ORAL TABLET TWICE DAILY FOR ANXIETY. 04/06/17  Yes [provider]  clopidogrel (  PLAVIX) 75 MG tablet Take 75 mg by mouth. 01/27/17  Yes [provider]  Fluticasone-Umeclidin-Vilant (TRELEGY ELLIPTA) 100-62.5-25 MCG/INH AEPB Inhale 1 Inhaler into the lungs daily. 09/30/17  Yes Laverle Hobby, MD  losartan (COZAAR) 100 MG tablet Take 1 tablet (100 mg total) by mouth daily. 11/12/17  Yes Pleas Koch, NP  Melatonin 5 MG TABS Take 5 mg by mouth at bedtime.    Yes [provider]  mirtazapine (REMERON) 7.5 MG tablet Take 1 tablet (7.5 mg total) at bedtime by mouth. Take 1 tablet 30 mintues  before daily at bedtime 10/06/17  Yes Baity, Coralie Keens, NP  Multiple Vitamin (MULTIVITAMIN) capsule Take 1 capsule by mouth daily.   Yes [provider]  Omega-3 Fatty Acids (FISH OIL PO) Take 520 mg by mouth.   Yes [provider]  OXYGEN 2 L continuous.    Yes [provider]  rosuvastatin (CRESTOR) 5 MG tablet Take 1 tablet (5 mg total) by mouth every evening. 09/15/17  Yes Pleas Koch, NP      Allergies  Allergen Reactions  . Lexapro [Escitalopram Oxalate] Other (See Comments)    Dizziness, nausea, fatigue  . Neosporin  [Neomycin-Polymyxin-Gramicidin] Rash    (Neosporin)    ROS:  Out of a complete 14 system review of symptoms, the patient complains only of the following symptoms, and all other reviewed systems are negative.  Fatigue Shortness of breath, wheezing Joint pain, joint swelling, muscle cramps, aching muscles Memory loss, numbness, weakness, dizziness Depression, anxiety, none of sleep, decreased energy, disinterest in activities Insomnia  Blood pressure (!) 142/60, pulse 89, height 5\' 2"  (1.575 m), weight 114 lb (51.7 kg), SpO2 93 %.  Physical Exam  General: The patient is alert and cooperative at the time of the examination.  Eyes: Pupils are equal, round, and reactive to light. Discs are flat bilaterally.  Neck: The neck is supple, a right carotid bruit is noted.  Respiratory: The respiratory examination is clear.  Cardiovascular: The cardiovascular examination reveals a regular rate and rhythm, no obvious murmurs or rubs are noted.  Skin: Extremities are without significant edema.  Neurologic Exam  Mental status: The patient is alert and oriented x 3 at the time of the examination. The patient has apparent normal recent and remote memory, with an apparently normal attention span and concentration ability.  The Mini-Mental status examination done today shows a total score 27/30.  Cranial nerves: Facial symmetry is  present. There is good sensation of the face to pinprick and soft touch bilaterally. The strength of the facial muscles and the muscles to head turning and shoulder shrug are normal bilaterally. Speech is well enunciated, no aphasia or dysarthria is noted. Extraocular movements are full. Visual fields are full. The tongue is midline, and the patient has symmetric elevation of the soft palate. No obvious hearing deficits are noted.  Motor: The motor testing reveals 5 over 5 strength of all 4 extremities, with exception of slight weakness with hip flexion on the left. Good symmetric motor tone is noted throughout.  Sensory: Sensory testing is intact to pinprick, soft touch, vibration sensation, and position sense on all 4 extremities. No evidence of extinction is noted.  Coordination: Cerebellar testing reveals good finger-nose-finger and heel-to-shin bilaterally.  Gait and station: Gait is normal. Tandem gait is very minimally unsteady. Romberg is negative. No drift is seen.  Reflexes: Deep tendon reflexes are symmetric and normal bilaterally, with exception of depression of the ankle jerk reflexes bilaterally.  Toes are downgoing bilaterally.   MRI brain 06/18/16:  IMPRESSION:   1. No acute intracranial pathology. 2. Moderate volume loss and moderate chronic small vessel ischemic disease. 3. There appears to be disproportionate atrophy of the ventricular system relative to the degree of sulcal effacement, suggesting possible NPH. Please correlate for clinical symptoms of NPH. 4. On the CSF flow study, there is biphasic flow in the cerebral aqueduct. However, this is indeterminate for NPH.   Assessment/Plan:  1.  History of memory disturbance  2.  Normal pressure hydrocephalus, status post VP shunt on right  3.  Right carotid bruit  The patient will undergo a CT scan of the brain as a baseline, she will have a carotid Doppler study to evaluate the right carotid bruit.  The patient does  have a prior history of a right brain stroke.  The patient has minimal gait instability, it appears that the symptom complex with normal pressure hydrocephalus seems to be out of sequence, usually the memory disturbance is a late finding with this.  The patient likely has a memory disturbance that is independent of the normal pressure hydrocephalus, she does have some moderate small vessel disease by prior MRI.  The memory issues will be followed over time, Namenda will be added in the future if the memory continues to progress.  Jill Alexanders MD 11/20/2017 9:45 AM  [facility] [address] [place], [place] [address]  Phone [phone] Fax [phone]

## 2017-11-26 ENCOUNTER — Encounter: Payer: Self-pay | Admitting: Primary Care

## 2017-11-26 ENCOUNTER — Ambulatory Visit: Payer: Medicare Other | Admitting: Primary Care

## 2017-11-26 VITALS — BP 118/58 | HR 82 | Temp 97.8°F | Ht 62.0 in | Wt 116.0 lb

## 2017-11-26 DIAGNOSIS — M79605 Pain in left leg: Secondary | ICD-10-CM | POA: Diagnosis not present

## 2017-11-26 DIAGNOSIS — J449 Chronic obstructive pulmonary disease, unspecified: Secondary | ICD-10-CM | POA: Diagnosis not present

## 2017-11-26 DIAGNOSIS — F419 Anxiety disorder, unspecified: Secondary | ICD-10-CM | POA: Diagnosis not present

## 2017-11-26 DIAGNOSIS — R739 Hyperglycemia, unspecified: Secondary | ICD-10-CM | POA: Diagnosis not present

## 2017-11-26 DIAGNOSIS — F329 Major depressive disorder, single episode, unspecified: Secondary | ICD-10-CM | POA: Diagnosis not present

## 2017-11-26 DIAGNOSIS — R29898 Other symptoms and signs involving the musculoskeletal system: Secondary | ICD-10-CM | POA: Diagnosis not present

## 2017-11-26 DIAGNOSIS — E785 Hyperlipidemia, unspecified: Secondary | ICD-10-CM | POA: Diagnosis not present

## 2017-11-26 DIAGNOSIS — E119 Type 2 diabetes mellitus without complications: Secondary | ICD-10-CM

## 2017-11-26 LAB — HEMOGLOBIN A1C: Hgb A1c MFr Bld: 7 % — ABNORMAL HIGH (ref 4.6–6.5)

## 2017-11-26 MED ORDER — ALBUTEROL SULFATE HFA 108 (90 BASE) MCG/ACT IN AERS: 2.0000 | INHALATION_SPRAY | RESPIRATORY_TRACT | 1 refills | Status: DC | PRN

## 2017-11-26 MED ORDER — CITALOPRAM HYDROBROMIDE 10 MG PO TABS: 10.0000 mg | ORAL_TABLET | Freq: Every day | ORAL | 0 refills | Status: DC

## 2017-11-26 NOTE — Patient Instructions (Signed)
Wean off of mirtazapine. Start by taking 7.5 mg every other day for 10 days then stop.  Start citalopram once daily after you wean off of the mirtazapine. We will call you for an update in 4 weeks.  You will be contacted regarding your referral to physical therapy.  Please let us know if you have not been contacted within one week.   Stop by the lab prior to leaving today. I will notify you of your results once received.   It was a pleasure to see you today!

## 2017-11-26 NOTE — Assessment & Plan Note (Signed)
Continue Crestor. Lipids stable.

## 2017-11-26 NOTE — Assessment & Plan Note (Addendum)
Since stroke in 2000. Crestor could very well be contributing, but patient is a poor historian and symptoms are unilateral (same side that was affected by CVA).   Referral placed for PT. Also recommended she get up from her chair and start walking. Continue Crestor for now, consider switching to Pravastatin if symptoms persist despite PT.

## 2017-11-26 NOTE — Assessment & Plan Note (Signed)
Will wean off Remeron as this causes drowsiness. Do believe anxiety is uncontrolled. Will try low dose Citalopram, continue Buspar. Will call for an update in 4 weeks.

## 2017-11-26 NOTE — Progress Notes (Signed)
Subjective:    Patient ID: Amanda Porter, female    DOB: Aug 31, 1946, 71 y.o.   MRN: [id number]  HPI  [name] is a 71 year old female who presents today for follow up of anxiety and depression. During her last visit her daughter was concerned that the patient was over medicated with antidepressants as she was taking Trintillex, mirtazapine, Valium,  and buspirone. The decision was made last time to remain off of Valuim and to slowly wean off Trintillex.   Since her last visit she still believes she's still over medicated and would like to come off of the mirtazapine. She's not noticed any change in symptoms since removal of Trintillex. She endorses that her appetite has returned and has put on some weight. GAD 7 score of 15 today. She's tried Zoloft and Lexapro in the past and didn't do well as it caused drowsiness. She is willing to try something else as she continues to feel that her anxiety is not well managed.   2) Hyperlipidemia/Left Lower Extremity Pain: Currently managed on Crestor 5 mg which was switched from Simvastatin 40 mg. She was switched from Simvastatin last visit due to myalgias of the left lower extremity. She continues to notice left upper thigh pain, she admits that she's not getting up and moving much as it's cold outside. She did feel better when exercising and moving. Her daughter isn't convinced that the medication is causing her left lower extremity pain as this has been problematic since her stroke. Her family would like for her to try physical therapy as her left side was affected from her CVA.    Review of Systems  Respiratory: Negative for shortness of breath.   Cardiovascular: Negative for chest pain.  Musculoskeletal: Positive for arthralgias and myalgias.  Neurological: Negative for numbness.  Psychiatric/Behavioral:       See HPI       Past Medical History:  Diagnosis Date  . Anxiety   . Anxiety and depression   . COPD (chronic obstructive  pulmonary disease) ([place])   . CVA (cerebral vascular accident) ([place])   . Dementia   . Dysphasia   . History of lung cancer   . Hyperlipidemia   . Memory disturbance 11/20/2017  . Normal pressure hydrocephalus 2018  . Retinal detachment    Right  . Right carotid bruit 11/20/2017  . Thrombosis    Arterial to lower extremity?     Social History   Socioeconomic History  . Marital status: Divorced    Spouse name: Not on file  . Number of children: 1  . Years of education: Not on file  . Highest education level: Not on file  Social Needs  . Financial resource strain: Not on file  . Food insecurity - worry: Not on file  . Food insecurity - inability: Not on file  . Transportation needs - medical: Not on file  . Transportation needs - non-medical: Not on file  Occupational History  . Occupation: Retired  Tobacco Use  . Smoking status: Former Smoker    Packs/day: 0.50    Last attempt to quit: 1955    Years since quitting: 64.0  . Smokeless tobacco: Never Used  Substance and Sexual Activity  . Alcohol use: No  . Drug use: No  . Sexual activity: Not on file  Other Topics Concern  . Not on file  Social History Narrative   Moved from [place].   Lives with daughter.   Right handed  Past Surgical History:  Procedure Laterality Date  . LUNG REMOVAL, PARTIAL     left upper lobe  . VENTRICULOPERITONEAL SHUNT  12/09/2016    Family History  Problem Relation Age of Onset  . Pneumonia Mother   . Cancer Father     Allergies  Allergen Reactions  . Lexapro [Escitalopram Oxalate] Other (See Comments)    Dizziness, nausea, fatigue  . Neosporin  [Neomycin-Polymyxin-Gramicidin] Rash    (Neosporin)    Current Outpatient Medications on File Prior to Visit  Medication Sig Dispense Refill  . amLODipine (NORVASC) 5 MG tablet Take 5 mg by mouth daily.     . busPIRone (BUSPAR) 7.5 MG tablet TAKE 1 ORAL TABLET TWICE DAILY FOR ANXIETY.    . clopidogrel (PLAVIX) 75 MG tablet Take 75  mg by mouth.    . Fluticasone-Umeclidin-Vilant (TRELEGY ELLIPTA) 100-62.5-25 MCG/INH AEPB Inhale 1 Inhaler into the lungs daily. 1 each 5  . losartan (COZAAR) 100 MG tablet Take 1 tablet (100 mg total) by mouth daily. 30 tablet 0  . Melatonin 5 MG TABS Take 5 mg by mouth at bedtime.     . Multiple Vitamin (MULTIVITAMIN) capsule Take 1 capsule by mouth daily.    . Omega-3 Fatty Acids (FISH OIL PO) Take 520 mg by mouth.    . OXYGEN 2 L continuous.     . rosuvastatin (CRESTOR) 5 MG tablet Take 1 tablet (5 mg total) by mouth every evening. 90 tablet 2   No current facility-administered medications on file prior to visit.     BP (!) 118/58   Pulse 82   Temp 97.8 F (36.6 C) (Oral)   Ht 5\' 2"  (1.575 m)   Wt 116 lb (52.6 kg)   SpO2 94%   BMI 21.22 kg/m    Objective:   Physical Exam  Constitutional: She appears well-nourished.  Neck: Neck supple.  Cardiovascular: Normal rate and regular rhythm.  Pulmonary/Chest: Effort normal and breath sounds normal.  Skin: Skin is warm and dry.  Psychiatric: She has a normal mood and affect.          Assessment & Plan:

## 2017-11-27 MED ORDER — ONETOUCH LANCETS MISC: 1.0000 | Freq: Two times a day (BID) | 1 refills | Status: DC

## 2017-11-27 MED ORDER — GLUCOSE BLOOD VI STRP: 1.0000 | ORAL_STRIP | Freq: Two times a day (BID) | 5 refills | Status: DC

## 2017-11-27 MED ORDER — METFORMIN HCL 500 MG PO TABS: 500.0000 mg | ORAL_TABLET | Freq: Two times a day (BID) | ORAL | 1 refills | Status: DC

## 2017-12-02 MED ORDER — GLIPIZIDE ER 2.5 MG PO TB24: 2.5000 mg | ORAL_TABLET | Freq: Every day | ORAL | 0 refills | Status: DC

## 2017-12-03 ENCOUNTER — Other Ambulatory Visit: Payer: Self-pay | Admitting: Primary Care

## 2017-12-03 ENCOUNTER — Encounter: Payer: Self-pay | Admitting: Primary Care

## 2017-12-03 DIAGNOSIS — J449 Chronic obstructive pulmonary disease, unspecified: Secondary | ICD-10-CM

## 2017-12-03 DIAGNOSIS — I10 Essential (primary) hypertension: Secondary | ICD-10-CM

## 2017-12-03 MED ORDER — ALBUTEROL SULFATE HFA 108 (90 BASE) MCG/ACT IN AERS: 2.0000 | INHALATION_SPRAY | RESPIRATORY_TRACT | 0 refills | Status: DC | PRN

## 2017-12-03 MED ORDER — LOSARTAN POTASSIUM 100 MG PO TABS: 100.0000 mg | ORAL_TABLET | Freq: Every day | ORAL | 3 refills | Status: DC

## 2017-12-04 ENCOUNTER — Telehealth: Payer: Self-pay | Admitting: Primary Care

## 2017-12-04 NOTE — Telephone Encounter (Signed)
Pt brought FMLA forms for [name] to complete. Please call pt [phone] when ready for pick up.

## 2017-12-07 ENCOUNTER — Other Ambulatory Visit: Payer: Self-pay | Admitting: Primary Care

## 2017-12-07 ENCOUNTER — Telehealth: Payer: Self-pay | Admitting: Primary Care

## 2017-12-07 DIAGNOSIS — I639 Cerebral infarction, unspecified: Secondary | ICD-10-CM

## 2017-12-07 NOTE — Telephone Encounter (Signed)
Completed form and placed on Robin's desk.

## 2017-12-07 NOTE — Telephone Encounter (Signed)
fmla paperwork In kate[employer] in box for review and signature

## 2017-12-08 NOTE — Telephone Encounter (Signed)
Med on Hx list. Last OV 10/2017

## 2017-12-08 NOTE — Telephone Encounter (Signed)
Refill sent to pharmacy.   

## 2017-12-09 ENCOUNTER — Ambulatory Visit: Payer: Medicare Other | Admitting: Psychology

## 2017-12-09 DIAGNOSIS — F411 Generalized anxiety disorder: Secondary | ICD-10-CM | POA: Diagnosis not present

## 2017-12-09 NOTE — Telephone Encounter (Signed)
Paperwork faxed °Pt aware °Copy for file  °Copy for scan °Copy for pt °

## 2017-12-14 ENCOUNTER — Encounter: Payer: Self-pay | Admitting: Primary Care

## 2017-12-17 ENCOUNTER — Telehealth: Payer: Self-pay | Admitting: Primary Care

## 2017-12-17 DIAGNOSIS — K219 Gastro-esophageal reflux disease without esophagitis: Secondary | ICD-10-CM

## 2017-12-17 DIAGNOSIS — F329 Major depressive disorder, single episode, unspecified: Secondary | ICD-10-CM

## 2017-12-17 DIAGNOSIS — F419 Anxiety disorder, unspecified: Secondary | ICD-10-CM

## 2017-12-17 MED ORDER — OMEPRAZOLE 20 MG PO CPDR: 20.0000 mg | DELAYED_RELEASE_CAPSULE | Freq: Every day | ORAL | 0 refills | Status: DC

## 2017-12-17 MED ORDER — HYDROXYZINE HCL 10 MG PO TABS: 10.0000 mg | ORAL_TABLET | Freq: Two times a day (BID) | ORAL | 0 refills | Status: DC | PRN

## 2017-12-17 NOTE — Telephone Encounter (Signed)
Spoke with patient's daughter. Patient started therapy which is going well.  Will restart Citalopram once daily. Also start low dose hydroxyzine 10 mg to use BID PRN for acute anxiety, drowsiness precautions provided. Continue buspar BID. Start omeprazole 20 mg for GI upset as Zantac isn't working well. Daughter will update in a few weeks. Continue therapy.

## 2017-12-21 ENCOUNTER — Encounter (HOSPITAL_COMMUNITY): Payer: Self-pay

## 2017-12-22 ENCOUNTER — Ambulatory Visit
Admission: RE | Admit: 2017-12-22 | Discharge: 2017-12-22 | Disposition: A | Discharge status: Home / Self Care | Payer: Medicare Other | Source: Ambulatory Visit | Attending: Neurology | Admitting: Neurology

## 2017-12-22 ENCOUNTER — Ambulatory Visit (HOSPITAL_COMMUNITY): Payer: Medicare Other

## 2017-12-22 ENCOUNTER — Ambulatory Visit (HOSPITAL_COMMUNITY)
Admission: RE | Admit: 2017-12-22 | Discharge: 2017-12-22 | Disposition: A | Discharge status: Home / Self Care | Payer: Medicare Other | Source: Ambulatory Visit | Attending: Neurology | Admitting: Neurology

## 2017-12-22 ENCOUNTER — Telehealth: Payer: Self-pay | Admitting: Neurology

## 2017-12-22 DIAGNOSIS — R0989 Other specified symptoms and signs involving the circulatory and respiratory systems: Secondary | ICD-10-CM

## 2017-12-22 DIAGNOSIS — G919 Hydrocephalus, unspecified: Secondary | ICD-10-CM | POA: Diagnosis not present

## 2017-12-22 DIAGNOSIS — I6523 Occlusion and stenosis of bilateral carotid arteries: Secondary | ICD-10-CM | POA: Diagnosis not present

## 2017-12-22 NOTE — Telephone Encounter (Signed)
  I called the patient.  The patient had a carotid Doppler study for a right carotid bruit, no evidence of carotid stenosis.   Carotid doppler 12/22/17:  Final Interpretation: Right Carotid: Velocities in the right ICA are consistent with a 1-39% stenosis.  Left Carotid: Velocities in the left ICA are consistent with a 1-39% stenosis.  Vertebrals: Both vertebral arteries were patent with antegrade flow.

## 2017-12-22 NOTE — Progress Notes (Signed)
Carotid artery duplex has been completed. 1-39% ICA stenosis bilaterally.  12/22/17 1:26 PM Carlos Levering RVT

## 2017-12-23 ENCOUNTER — Telehealth: Payer: Self-pay | Admitting: Neurology

## 2017-12-23 NOTE — Telephone Encounter (Signed)
  CT of the head was done as a baseline study.  The patient does appear to have at least a moderate level of small vessel ischemic changes, this could potentially mimic symptoms of normal pressure hydrocephalus.  If the patient has a change in her clinical condition, this study can be used as a comparison.  CT brain 12/23/17:  IMPRESSION:   Abnormal CT head (without) demonstrating: 1. Intraventricular shunt catheter with right frontal approach, with tip of catheter near septum pellucidum. Mild ventriculomegaly.  2. Chronic right thalamic ischemic infarction (46mm). 3. Moderate periventricular and subcortical chronic small vessel ischemic disease. 4. No acute findings.

## 2017-12-24 ENCOUNTER — Ambulatory Visit: Payer: Medicare Other | Admitting: Psychology

## 2017-12-24 DIAGNOSIS — F411 Generalized anxiety disorder: Secondary | ICD-10-CM

## 2017-12-29 NOTE — Progress Notes (Signed)
[facility] Consultation      Assessment and Plan:  72 year old female with history of lung cancer and lobectomy approximately 20 years ago, now with emphysema and progressive dyspnea on exertion.  COPD with acute bronchitis and exacerbation. -Severe oxygen dependent emphysema. -Trilogy inhaler. -We will obtain records from her previous pulmonologist including previous pulmonary function testing. --History or LUL lobectomy for lung ca in 2000.   Dyspnea on exertion with deconditioning. -Dyspnea on exertion is likely multifactorial from severe COPD, deconditioning, previous lobectomy. -Discussed the importance of increasing her activity level and keeping her energy level up.  -Referred to pulm rehab.   Nicotine abuse. -She has recently stopped smoking and is encouraged to continue.   Dementia. -She has some difficulty remembering inhalers and and her inhaler regimen.  She also has some difficulty in coordinating with HFA inhalers. -Spent time and training the patient to use Symbicort inhaler.  I did advise her to switch to Trilogy inhaler which I think would be easier for her to use, but it may not be affordable.  A prescription was sent for Trilogy inhaler if it is too expensive I asked her to switch back to Symbicort and use Ventolin as needed.  Orders Placed This Encounter  Procedures  . AMB REFERRAL FOR DME  . AMB referral to pulmonary rehabilitation     Return in about 6 months (around 06/29/2018).   Date: 12/29/2017  MRN# [id number] Sallyanne Birkhead [birth year]    Albana Saperstein is a 72 y.o. old female seen in consultation for chief complaint of:    Chief Complaint  Patient presents with  . COPD    Pt states breathing worse.    HPI:   Ms.Kasiya A Granados is a 72 y.o. year old female with  history of dysphagia with solid & liqud, history of previous lobectomy,  normal pressure hydrocephalus s/p shunt placement on 12/09/16. She also has history of  multiple TIAs and choking episodes when eating. She moved here recently from [place] to be with her daughter due to a diagnosis of dementia and decline in her function over the past year.  At last visit she was switched to Trelegy inhaler as she was forgetting to use the second dose of symbicort, and recommended smoking cessation, increased physical activity. She was also given a course and azithro for acute bronchitis.  Since the weather got cold, her breathing is worse, she is doing less and her daughter notes that she is getting dyspnea with walking from one room to the nex. She is using Trelegy inhaler every morning.  She is using her rescue inhaler once daily. She does not use a nebulizer.  She is on 2L oxygen all times. She stopped smoking 08/2017.   Imaging personally reviewed chest x-ray 09/30/17; bibasilar atelectasis, possible pleural thickening and changes of chronic bronchitis.  Desat walk 09/30/17; baseline sat was 95% and HR 83. After walking 360 feet sat was 86% and HR 120, added oxygen at 2L and sat increased to 92%  Addendum 10/27/17; Outside records reviewed Spirometry 01/12/17 FVC equals 65% of predicted, 10% improvement with bronchodilator.  FEV1 equals 45% of predicted, 6% improvement with bronchodilator.  FEV FVC ratio is 53%.  Overall consistent with severe emphysema without significant improvement with bronchodilator. Office visit 01/12/17; notes made of previous lobectomy June of the year 2000.  Left femoral embolectomy 1998 noted that PFTs have been stable since approximately 2011 when her FEV1 was 50%.  She was given a trial of Trilogy  inhaler with thought given to possible de-escalation in the future.  Medication:    Current Outpatient Medications:  .  albuterol (PROVENTIL HFA;VENTOLIN HFA) 108 (90 Base) MCG/ACT inhaler, Inhale 2 puffs into the lungs every 4 (four) hours as needed., Disp: 1 Inhaler, Rfl: 0 .  amLODipine (NORVASC) 5 MG tablet, Take 5 mg by mouth daily. ,  Disp: , Rfl:  .  busPIRone (BUSPAR) 7.5 MG tablet, TAKE 1 ORAL TABLET TWICE DAILY FOR ANXIETY., Disp: , Rfl:  .  citalopram (CELEXA) 10 MG tablet, Take 1 tablet (10 mg total) by mouth daily., Disp: 90 tablet, Rfl: 0 .  clopidogrel (PLAVIX) 75 MG tablet, TAKE 1 TABLET BY MOUTH EVERY DAY, Disp: 90 tablet, Rfl: 2 .  Fluticasone-Umeclidin-Vilant (TRELEGY ELLIPTA) 100-62.5-25 MCG/INH AEPB, Inhale 1 Inhaler into the lungs daily., Disp: 1 each, Rfl: 5 .  glipiZIDE (GLIPIZIDE XL) 2.5 MG 24 hr tablet, Take 1 tablet (2.5 mg total) by mouth daily with breakfast. For diabetes, Disp: 90 tablet, Rfl: 0 .  glucose blood test strip, 1 each by Other route 2 (two) times daily. Use as instructed, Disp: 100 each, Rfl: 5 .  hydrOXYzine (ATARAX/VISTARIL) 10 MG tablet, Take 1 tablet (10 mg total) by mouth 2 (two) times daily as needed for anxiety., Disp: 30 tablet, Rfl: 0 .  losartan (COZAAR) 100 MG tablet, Take 1 tablet (100 mg total) by mouth daily., Disp: 90 tablet, Rfl: 3 .  Melatonin 5 MG TABS, Take 5 mg by mouth at bedtime. , Disp: , Rfl:  .  Multiple Vitamin (MULTIVITAMIN) capsule, Take 1 capsule by mouth daily., Disp: , Rfl:  .  Omega-3 Fatty Acids (FISH OIL PO), Take 520 mg by mouth., Disp: , Rfl:  .  omeprazole (PRILOSEC) 20 MG capsule, Take 1 capsule (20 mg total) by mouth daily., Disp: 90 capsule, Rfl: 0 .  ONE TOUCH LANCETS MISC, 1 each by Other route 2 (two) times daily., Disp: 200 each, Rfl: 1 .  OXYGEN, 2 L continuous. , Disp: , Rfl:  .  rosuvastatin (CRESTOR) 5 MG tablet, Take 1 tablet (5 mg total) by mouth every evening., Disp: 90 tablet, Rfl: 2   Allergies:  Lexapro [escitalopram oxalate] and Neosporin  [neomycin-polymyxin-gramicidin]  Review of Systems: Gen:  Denies  fever, sweats, chills HEENT: Denies blurred vision, double vision. bleeds, sore throat Cvc:  No dizziness, chest pain. Resp:   Denies cough or sputum production, shortness of breath Gi: Denies swallowing difficulty, stomach  pain. Gu:  Denies bladder incontinence, burning urine Ext:   No Joint pain, stiffness. Skin: No skin rash,  hives  Endoc:  No polyuria, polydipsia. Psych: No depression, insomnia. Other:  All other systems were reviewed with the patient and were negative other that what is mentioned in the HPI.   Physical Examination:   VS: BP 130/66 (BP Location: Left Arm, Cuff Size: Normal)   Pulse (!) 107   Resp 16   Ht 5\' 2"  (1.575 m)   Wt 115 lb (52.2 kg)   SpO2 94%   BMI 21.03 kg/m   General Appearance: No distress  Neuro:without focal findings,  speech normal,  HEENT: PERRLA, EOM intact.   Pulmonary: normal breath sounds, No wheezing.  CardiovascularNormal S1,S2.  No m/r/g.   Abdomen: Benign, Soft, non-tender. Renal:  No costovertebral tenderness  GU:  No performed at this time. Endoc: No evident thyromegaly, no signs of acromegaly. Skin:   warm, no rashes, no ecchymosis  Extremities: normal, no cyanosis, clubbing.  Other findings:  LABORATORY PANEL:   CBC No results for input(s): WBC, HGB, HCT, PLT in the last 168 hours. ------------------------------------------------------------------------------------------------------------------  Chemistries  No results for input(s): NA, K, CL, CO2, GLUCOSE, BUN, CREATININE, CALCIUM, MG, AST, ALT, ALKPHOS, BILITOT in the last 168 hours.  Invalid input(s): GFRCGP ------------------------------------------------------------------------------------------------------------------  Cardiac Enzymes No results for input(s): TROPONINI in the last 168 hours. ------------------------------------------------------------  RADIOLOGY:  No results found.     Thank  you for the consultation and for allowing [facility] to assist in the care of your patient. Our recommendations are noted above.  Please contact us if we can be of further service.   Marda Stalker, MD.  Board Certified in Internal Medicine, Pulmonary  Medicine, [place], and Sleep Medicine.  [facility] Office Number: [phone]  Patricia Pesa, M.D.  Merton Border, M.D  12/29/2017

## 2017-12-30 ENCOUNTER — Encounter: Payer: Self-pay | Admitting: Internal Medicine

## 2017-12-30 ENCOUNTER — Ambulatory Visit (INDEPENDENT_AMBULATORY_CARE_PROVIDER_SITE_OTHER): Payer: Medicare Other | Admitting: Internal Medicine

## 2017-12-30 VITALS — BP 130/66 | HR 107 | Resp 16 | Ht 62.0 in | Wt 115.0 lb

## 2017-12-30 DIAGNOSIS — J449 Chronic obstructive pulmonary disease, unspecified: Secondary | ICD-10-CM | POA: Diagnosis not present

## 2017-12-30 MED ORDER — IPRATROPIUM-ALBUTEROL 0.5-2.5 (3) MG/3ML IN SOLN: 3.0000 mL | Freq: Four times a day (QID) | RESPIRATORY_TRACT | 5 refills | Status: DC | PRN

## 2017-12-30 NOTE — Patient Instructions (Signed)
--  Would recommend you go to pulmonary rehab.   --Will start duonebs in a nebulizer 2 to 3 times per day.

## 2017-12-31 ENCOUNTER — Other Ambulatory Visit: Payer: Self-pay | Admitting: Primary Care

## 2017-12-31 DIAGNOSIS — J449 Chronic obstructive pulmonary disease, unspecified: Secondary | ICD-10-CM

## 2018-01-01 ENCOUNTER — Other Ambulatory Visit: Payer: Self-pay | Admitting: *Deleted

## 2018-01-01 ENCOUNTER — Ambulatory Visit: Payer: Medicare Other | Admitting: Psychology

## 2018-01-01 MED ORDER — IPRATROPIUM-ALBUTEROL 0.5-2.5 (3) MG/3ML IN SOLN: RESPIRATORY_TRACT | 5 refills | Status: DC

## 2018-01-07 ENCOUNTER — Ambulatory Visit (INDEPENDENT_AMBULATORY_CARE_PROVIDER_SITE_OTHER): Payer: Medicare Other | Admitting: Psychology

## 2018-01-07 DIAGNOSIS — F411 Generalized anxiety disorder: Secondary | ICD-10-CM

## 2018-01-21 ENCOUNTER — Ambulatory Visit: Payer: Medicare Other | Admitting: Psychology

## 2018-01-25 ENCOUNTER — Encounter: Payer: Self-pay | Admitting: Internal Medicine

## 2018-01-25 ENCOUNTER — Encounter (INDEPENDENT_AMBULATORY_CARE_PROVIDER_SITE_OTHER): Payer: Self-pay

## 2018-01-25 ENCOUNTER — Ambulatory Visit
Admission: RE | Admit: 2018-01-25 | Discharge: 2018-01-25 | Disposition: A | Discharge status: Home / Self Care | Payer: Medicare Other | Source: Ambulatory Visit | Attending: Internal Medicine | Admitting: Internal Medicine

## 2018-01-25 ENCOUNTER — Ambulatory Visit: Payer: Medicare Other | Admitting: Internal Medicine

## 2018-01-25 VITALS — BP 122/66 | HR 97 | Resp 16 | Ht 62.0 in | Wt 116.0 lb

## 2018-01-25 DIAGNOSIS — J441 Chronic obstructive pulmonary disease with (acute) exacerbation: Secondary | ICD-10-CM

## 2018-01-25 DIAGNOSIS — R0602 Shortness of breath: Secondary | ICD-10-CM | POA: Insufficient documentation

## 2018-01-25 DIAGNOSIS — J449 Chronic obstructive pulmonary disease, unspecified: Secondary | ICD-10-CM | POA: Diagnosis not present

## 2018-01-25 DIAGNOSIS — R918 Other nonspecific abnormal finding of lung field: Secondary | ICD-10-CM | POA: Insufficient documentation

## 2018-01-25 MED ORDER — PREDNISONE 10 MG (21) PO TBPK: ORAL_TABLET | ORAL | 0 refills | Status: DC

## 2018-01-25 MED ORDER — AMOXICILLIN-POT CLAVULANATE 875-125 MG PO TABS: 1.0000 | ORAL_TABLET | Freq: Two times a day (BID) | ORAL | 0 refills | Status: AC

## 2018-01-25 NOTE — Progress Notes (Signed)
[facility] Consultation      Assessment and Plan:  72 year old female with history of lung cancer and lobectomy approximately 20 years ago, now with emphysema and progressive dyspnea on exertion.  COPD with acute bronchitis and exacerbation versus pneumonia. -Sent in prescription for 6-day prednisone taper, 1 week course of Augmentin, will check chest x-ray to look for evidence of pneumonia. -Severe oxygen dependent emphysema, FEV1 equals 45% -Trilogy inhaler. --History or LUL lobectomy for lung ca in 2000.   Dyspnea on exertion with deconditioning. -Dyspnea on exertion is likely multifactorial from severe COPD, deconditioning, previous lobectomy. -Discussed the importance of increasing her activity level and keeping her energy level up.  -Referreded to pulm rehab at previous visit, but she has not yet started. Asked to start once she is through this acute problem.   Nicotine abuse. -She has recently stopped smoking and is encouraged to continue.   Dementia. -Continue Trilogy inhaler and use Ventolin as needed.  Orders Placed This Encounter  Procedures  . DG Chest 2 View   Meds ordered this encounter  Medications  . predniSONE (STERAPRED UNI-PAK 21 TAB) 10 MG (21) TBPK tablet    Sig: Take as directed.    Dispense:  21 tablet    Refill:  0  . amoxicillin-clavulanate (AUGMENTIN) 875-125 MG tablet    Sig: Take 1 tablet by mouth 2 (two) times daily for 7 days.    Dispense:  14 tablet    Refill:  0     Return in about 3 months (around 04/24/2018).   Date: 01/25/2018  MRN# [id number] Amanda Porter 1946/04/29    Amanda Porter is a 72 y.o. old female seen in consultation for chief complaint of:    Chief Complaint  Patient presents with  . COPD    pt has had  increased sob for 2 weeks and now has increased fatigue/non productive cough.     Synopsis: Amanda Porter is a 72 y.o. year old female with  history of dysphagia with solid & liqud,  history of previous lobectomy,  normal pressure hydrocephalus s/p shunt placement on 12/09/16. She also has history of multiple TIAs and choking episodes when eating. She moved here recently from [place] to be with her daughter due to a diagnosis of dementia and decline in her function over the past year.  Patient has severe emphysema with FEV1 of 45%.  Subjective: At last visit she was continued on Trelegy inhaler every morning.  She is using her rescue inhaler once daily. She does not use a nebulizer.  She is on 2L oxygen all times. She stopped smoking 08/2017.  Over the past 2-3 weeks she has been having more weakness, trouble breathing, headache, body aches. She has trouble sleeping laying flat, trouble bringing up anything.   **chest x-ray 09/30/17; bibasilar atelectasis, possible pleural thickening and changes of chronic bronchitis. **Desat walk 09/30/17; baseline sat was 95% and HR 83. After walking 360 feet sat was 86% and HR 120, added oxygen at 2L and sat increased to 92%  Addendum 10/27/17; Outside records reviewed Spirometry 01/12/17 FVC equals 65% of predicted, 10% improvement with bronchodilator.  FEV1 equals 45% of predicted, 6% improvement with bronchodilator.  FEV FVC ratio is 53%.  Overall consistent with severe emphysema without significant improvement with bronchodilator. Office visit 01/12/17; notes made of previous lobectomy June of the year 2000.  Left femoral embolectomy 1998 noted that PFTs have been stable since approximately 2011 when her FEV1 was 50%.  She was  given a trial of Trilogy inhaler with thought given to possible de-escalation in the future.  Medication:    Current Outpatient Medications:  .  albuterol (PROVENTIL HFA;VENTOLIN HFA) 108 (90 Base) MCG/ACT inhaler, TAKE 2 PUFFS BY MOUTH EVERY 4 HOURS AS NEEDED, Disp: 8.5 Inhaler, Rfl: 2 .  amLODipine (NORVASC) 5 MG tablet, Take 5 mg by mouth daily. , Disp: , Rfl:  .  busPIRone (BUSPAR) 7.5 MG tablet, TAKE 1 ORAL TABLET  TWICE DAILY FOR ANXIETY., Disp: , Rfl:  .  clopidogrel (PLAVIX) 75 MG tablet, TAKE 1 TABLET BY MOUTH EVERY DAY, Disp: 90 tablet, Rfl: 2 .  Fluticasone-Umeclidin-Vilant (TRELEGY ELLIPTA) 100-62.5-25 MCG/INH AEPB, Inhale 1 Inhaler into the lungs daily., Disp: 1 each, Rfl: 5 .  glipiZIDE (GLIPIZIDE XL) 2.5 MG 24 hr tablet, Take 1 tablet (2.5 mg total) by mouth daily with breakfast. For diabetes, Disp: 90 tablet, Rfl: 0 .  glucose blood test strip, 1 each by Other route 2 (two) times daily. Use as instructed, Disp: 100 each, Rfl: 5 .  hydrOXYzine (ATARAX/VISTARIL) 10 MG tablet, Take 1 tablet (10 mg total) by mouth 2 (two) times daily as needed for anxiety., Disp: 30 tablet, Rfl: 0 .  ipratropium-albuterol (DUONEB) 0.5-2.5 (3) MG/3ML SOLN, Take 37mLs by nebulization every 6 (six) hours and prn. Dx:J44.9, Disp: 360 mL, Rfl: 5 .  losartan (COZAAR) 100 MG tablet, Take 1 tablet (100 mg total) by mouth daily., Disp: 90 tablet, Rfl: 3 .  Melatonin 5 MG TABS, Take 5 mg by mouth at bedtime. , Disp: , Rfl:  .  Multiple Vitamin (MULTIVITAMIN) capsule, Take 1 capsule by mouth daily., Disp: , Rfl:  .  Omega-3 Fatty Acids (FISH OIL PO), Take 520 mg by mouth., Disp: , Rfl:  .  omeprazole (PRILOSEC) 20 MG capsule, Take 1 capsule (20 mg total) by mouth daily., Disp: 90 capsule, Rfl: 0 .  ONE TOUCH LANCETS MISC, 1 each by Other route 2 (two) times daily., Disp: 200 each, Rfl: 1 .  OXYGEN, 2 L continuous. , Disp: , Rfl:  .  rosuvastatin (CRESTOR) 5 MG tablet, Take 1 tablet (5 mg total) by mouth every evening., Disp: 90 tablet, Rfl: 2   Allergies:  Lexapro [escitalopram oxalate] and Neosporin  [neomycin-polymyxin-gramicidin]  Review of Systems: Gen:  Denies  fever, sweats, chills HEENT: Denies blurred vision, double vision. bleeds, sore throat Cvc:  No dizziness, chest pain. Resp:   Denies cough or sputum production, shortness of breath Gi: Denies swallowing difficulty, stomach pain. Gu:  Denies bladder  incontinence, burning urine Ext:   No Joint pain, stiffness. Skin: No skin rash,  hives  Endoc:  No polyuria, polydipsia. Psych: No depression, insomnia. Other:  All other systems were reviewed with the patient and were negative other that what is mentioned in the HPI.   Physical Examination:   VS: BP 122/66 (BP Location: Left Arm, Cuff Size: Normal)   Pulse 97   Resp 16   Ht 5\' 2"  (1.575 m)   Wt 116 lb (52.6 kg)   SpO2 93%   BMI 21.22 kg/m   General Appearance: No distress  Neuro:without focal findings,  speech normal,  HEENT: PERRLA, EOM intact.   Pulmonary: normal breath sounds, No wheezing.  Decreased air entry bilaterally.  Basal crackles. CardiovascularNormal S1,S2.  No m/r/g.   Abdomen: Benign, Soft, non-tender. Renal:  No costovertebral tenderness  GU:  No performed at this time. Endoc: No evident thyromegaly, no signs of acromegaly. Skin:   warm, no  rashes, no ecchymosis  Extremities: normal, no cyanosis, clubbing.  Other findings:    LABORATORY PANEL:   CBC No results for input(s): WBC, HGB, HCT, PLT in the last 168 hours. ------------------------------------------------------------------------------------------------------------------  Chemistries  No results for input(s): NA, K, CL, CO2, GLUCOSE, BUN, CREATININE, CALCIUM, MG, AST, ALT, ALKPHOS, BILITOT in the last 168 hours.  Invalid input(s): GFRCGP ------------------------------------------------------------------------------------------------------------------  Cardiac Enzymes No results for input(s): TROPONINI in the last 168 hours. ------------------------------------------------------------  RADIOLOGY:  No results found.     Thank  you for the consultation and for allowing [facility] to assist in the care of your patient. Our recommendations are noted above.  Please contact us if we can be of further service.   Marda Stalker, MD.  Board Certified in Internal  Medicine, Pulmonary Medicine, [place], and Sleep Medicine.  [facility] Office Number: [phone]  Patricia Pesa, M.D.  Merton Border, M.D  01/25/2018

## 2018-01-25 NOTE — Patient Instructions (Signed)
We will prescribe a prednisone taper, and a one week course of antibiotics.  We will also check a chest x-ray.  Sleep see improvement with the next 1-2 days, if not we may need to refer you to the emergency room.

## 2018-01-28 ENCOUNTER — Ambulatory Visit: Payer: Medicare Other | Admitting: Psychology

## 2018-01-28 ENCOUNTER — Telehealth: Payer: Self-pay | Admitting: Internal Medicine

## 2018-01-28 NOTE — Telephone Encounter (Signed)
Patient's daughter advised as stated below.

## 2018-01-28 NOTE — Telephone Encounter (Signed)
Patient daughter calling for results of cxr. She states there has not been any change in her mother's condition since Monday's visit. She is having GI upset on abx given (Augmentin). If she has to go to hospital can she be direct admit and not have to go thru emergency dept due to possible sick contacts.  Please advise.  01/25/18 office visit discharge summary:  We will prescribe a prednisone taper, and a one week course of antibiotics.  We will also check a chest x-ray. Sleep see improvement with the next 1-2 days, if not we may need to refer you to the emergency room.

## 2018-01-28 NOTE — Telephone Encounter (Signed)
I do not see anything new on her chest xray, but her previous surgery makes it hard to see. If she is not better by tomorrow, I would take her to the hospital. Unfortunately we do not do direct admissions.

## 2018-01-28 NOTE — Telephone Encounter (Signed)
Please call POA , Saory Carriero, states she has some questions, she would not give any details.

## 2018-01-29 ENCOUNTER — Ambulatory Visit: Payer: Medicare Other | Admitting: Psychology

## 2018-02-03 ENCOUNTER — Encounter: Payer: Self-pay | Admitting: Primary Care

## 2018-02-07 ENCOUNTER — Other Ambulatory Visit: Payer: Self-pay | Admitting: Primary Care

## 2018-02-07 DIAGNOSIS — E119 Type 2 diabetes mellitus without complications: Secondary | ICD-10-CM

## 2018-02-16 ENCOUNTER — Encounter: Payer: Self-pay | Admitting: Primary Care

## 2018-02-16 DIAGNOSIS — J449 Chronic obstructive pulmonary disease, unspecified: Secondary | ICD-10-CM

## 2018-02-18 ENCOUNTER — Ambulatory Visit: Payer: Medicare Other | Admitting: Primary Care

## 2018-02-18 ENCOUNTER — Encounter: Payer: Self-pay | Admitting: Primary Care

## 2018-02-18 VITALS — BP 116/58 | HR 101 | Temp 97.9°F | Ht 62.0 in | Wt 114.5 lb

## 2018-02-18 DIAGNOSIS — F329 Major depressive disorder, single episode, unspecified: Secondary | ICD-10-CM

## 2018-02-18 DIAGNOSIS — F419 Anxiety disorder, unspecified: Secondary | ICD-10-CM

## 2018-02-18 DIAGNOSIS — G4700 Insomnia, unspecified: Secondary | ICD-10-CM | POA: Diagnosis not present

## 2018-02-18 DIAGNOSIS — E1165 Type 2 diabetes mellitus with hyperglycemia: Secondary | ICD-10-CM | POA: Insufficient documentation

## 2018-02-18 DIAGNOSIS — E119 Type 2 diabetes mellitus without complications: Secondary | ICD-10-CM

## 2018-02-18 DIAGNOSIS — F32A Depression, unspecified: Secondary | ICD-10-CM

## 2018-02-18 LAB — HEMOGLOBIN A1C: HEMOGLOBIN A1C: 7.5 % — AB (ref 4.6–6.5)

## 2018-02-18 MED ORDER — TRAZODONE HCL 50 MG PO TABS: ORAL_TABLET | ORAL | 0 refills | Status: DC

## 2018-02-18 MED ORDER — ALBUTEROL SULFATE HFA 108 (90 BASE) MCG/ACT IN AERS
INHALATION_SPRAY | RESPIRATORY_TRACT | 0 refills | Status: DC
Start: 2018-02-18 — End: 2018-03-23

## 2018-02-18 NOTE — Progress Notes (Signed)
Subjective:    Patient ID: Amanda Porter, female    DOB: [birth year], 72 y.o.   MRN: [id number]  HPI  Amanda Porter is a 72 year old female who presents today for follow up.  1) Type 2 Diabetes:  Current medications include: Glipize XL 2.5 mg. She was once on Metformin in the past which caused GI upset.   She is checking her blood glucose several times daily at random times and is getting readings of:  180-low 200's.    Last A1C: 7.0 in December 2018 Pneumonia Vaccination: Completed Prevnar in 2014, Pneumovax in 2009 ACE/ARB: ARB Statin: Crestor  Diet currently consists of:  Breakfast: Cereal, Boost, Applesauce Lunch: Sandwich (peanut butter and jelly, ham), biscuits Dinner: Meat, vegetable, beans, starch Snacks: Boost, crackers, chips, graham crackers, raisin cakes Desserts: Ice cream, raisin cakes, graham crackers; daily Beverages: Milk, regular soda, water  2) Anxiety and Insomnia: Currently managed on Buspar 7.5 mg twice daily. She was previoulsy managed on mirtazapine and Trintillex when she first established. She endorsed feeling over medicated and no improvement with anxiety so she requested to wean off of those medications.   Her main concern today is insomnia. She will doze in and out on the couch around 7 pm, goes to bed around 9 pm. She watches TV in bed until she feels tired which is around 11 pm. She'll wake back up around 2:30 am and stay up. She will sometimes doze off in the morning. She is taking Melatonin 10 mg just prior to bedtime.   GAD 7 score of 15 today. She's seen her therapist twice since referred.      Review of Systems  Cardiovascular: Negative for chest pain.  Neurological: Negative for dizziness and headaches.  Psychiatric/Behavioral: Positive for sleep disturbance. The patient is nervous/anxious.        See HPI       Past Medical History:  Diagnosis Date  . Anxiety   . Anxiety and depression   . COPD (chronic obstructive  pulmonary disease) ([place])   . CVA (cerebral vascular accident) ([place])   . Dementia   . Dysphasia   . History of lung cancer   . Hyperlipidemia   . Memory disturbance 11/20/2017  . Normal pressure hydrocephalus 2018  . Retinal detachment    Right  . Right carotid bruit 11/20/2017  . Thrombosis    Arterial to lower extremity?     Social History   Socioeconomic History  . Marital status: Divorced    Spouse name: Not on file  . Number of children: 1  . Years of education: Not on file  . Highest education level: Not on file  Occupational History  . Occupation: Retired  Scientific laboratory technician  . Financial resource strain: Not on file  . Food insecurity:    Worry: Not on file    Inability: Not on file  . Transportation needs:    Medical: Not on file    Non-medical: Not on file  Tobacco Use  . Smoking status: Former Smoker    Packs/day: 0.50    Last attempt to quit: 1955    Years since quitting: 64.2  . Smokeless tobacco: Never Used  Substance and Sexual Activity  . Alcohol use: No  . Drug use: No  . Sexual activity: Not on file  Lifestyle  . Physical activity:    Days per week: Not on file    Minutes per session: Not on file  . Stress: Not on file  Relationships  .  Social connections:    Talks on phone: Not on file    Gets together: Not on file    Attends religious service: Not on file    Active member of club or organization: Not on file    Attends meetings of clubs or organizations: Not on file    Relationship status: Not on file  . Intimate partner violence:    Fear of current or ex partner: Not on file    Emotionally abused: Not on file    Physically abused: Not on file    Forced sexual activity: Not on file  Other Topics Concern  . Not on file  Social History Narrative   Moved from [place].   Lives with daughter.   Right handed    Past Surgical History:  Procedure Laterality Date  . LUNG REMOVAL, PARTIAL     left upper lobe  . VENTRICULOPERITONEAL SHUNT   12/09/2016    Family History  Problem Relation Age of Onset  . Pneumonia Mother   . Cancer Father     Allergies  Allergen Reactions  . Lexapro [Escitalopram Oxalate] Other (See Comments)    Dizziness, nausea, fatigue  . Neosporin  [Neomycin-Polymyxin-Gramicidin] Rash    (Neosporin)    Current Outpatient Medications on File Prior to Visit  Medication Sig Dispense Refill  . albuterol (PROVENTIL HFA;VENTOLIN HFA) 108 (90 Base) MCG/ACT inhaler TAKE 2 PUFFS BY MOUTH EVERY 4 HOURS AS NEEDED 8.5 Inhaler 2  . amLODipine (NORVASC) 5 MG tablet Take 5 mg by mouth daily.     . busPIRone (BUSPAR) 7.5 MG tablet TAKE 1 ORAL TABLET TWICE DAILY FOR ANXIETY.    . clopidogrel (PLAVIX) 75 MG tablet TAKE 1 TABLET BY MOUTH EVERY DAY 90 tablet 2  . Fluticasone-Umeclidin-Vilant (TRELEGY ELLIPTA) 100-62.5-25 MCG/INH AEPB Inhale 1 Inhaler into the lungs daily. 1 each 5  . glipiZIDE (GLIPIZIDE XL) 2.5 MG 24 hr tablet Take 1 tablet (2.5 mg total) by mouth daily with breakfast. For diabetes 90 tablet 0  . ipratropium-albuterol (DUONEB) 0.5-2.5 (3) MG/3ML SOLN Take 21mLs by nebulization every 6 (six) hours and prn. Dx:J44.9 360 mL 5  . losartan (COZAAR) 100 MG tablet Take 1 tablet (100 mg total) by mouth daily. 90 tablet 3  . Melatonin 5 MG TABS Take 5 mg by mouth at bedtime.     . Multiple Vitamin (MULTIVITAMIN) capsule Take 1 capsule by mouth daily.    . Omega-3 Fatty Acids (FISH OIL PO) Take 520 mg by mouth.    Marland Kitchen omeprazole (PRILOSEC) 20 MG capsule Take 1 capsule (20 mg total) by mouth daily. 90 capsule 0  . ONE TOUCH LANCETS MISC 1 each by Other route 2 (two) times daily. 200 each 1  . ONE TOUCH ULTRA TEST test strip USE 1 TWICE A DAY AS INSTRUCTED 100 each 5  . OXYGEN 2 L continuous.     . rosuvastatin (CRESTOR) 5 MG tablet Take 1 tablet (5 mg total) by mouth every evening. 90 tablet 2  . hydrOXYzine (ATARAX/VISTARIL) 10 MG tablet Take 1 tablet (10 mg total) by mouth 2 (two) times daily as needed for  anxiety. (Patient not taking: Reported on 02/18/2018) 30 tablet 0   No current facility-administered medications on file prior to visit.     BP (!) 116/58   Pulse (!) 101   Temp 97.9 F (36.6 C) (Oral)   Ht 5\' 2"  (1.575 m)   Wt 114 lb 8 oz (51.9 kg)   SpO2 97%  BMI 20.94 kg/m    Objective:   Physical Exam  Constitutional: She appears well-nourished.  Neck: Neck supple.  Cardiovascular: Normal rate and regular rhythm.  Pulmonary/Chest: Effort normal and breath sounds normal.  Skin: Skin is warm and dry.  Psychiatric: She has a normal mood and affect.          Assessment & Plan:

## 2018-02-18 NOTE — Patient Instructions (Addendum)
Stop by the lab prior to leaving today. I will notify you of your results once received.   Stop by the front desk and speak with either Rosaria Ferries or Helyn App regarding your referral to the nutritionist.  Start trazodone 50 mg tablets for difficulty sleeping. Take 1/2 to 1 tablet at bedtime. Please update me in a few days, we can titrate the dose up if needed.  Please schedule a follow up appointment in 6 months.   It was a pleasure to see you today!

## 2018-02-18 NOTE — Assessment & Plan Note (Signed)
Continues to experience anxiety and insomnia, seems more bothered by insomnia. Will trial low dose Trazodone HS. She and her daughter will update in a few days. Continue Buspar BID. Continue therapy appointments.

## 2018-02-18 NOTE — Assessment & Plan Note (Signed)
Glucose readings above goal, repeat A1C today. Nutrition referral placed today. Consider increasing Glipizide as she cannot tolerate Metformin.

## 2018-02-19 ENCOUNTER — Other Ambulatory Visit: Payer: Self-pay | Admitting: Primary Care

## 2018-02-19 DIAGNOSIS — E119 Type 2 diabetes mellitus without complications: Secondary | ICD-10-CM

## 2018-02-19 MED ORDER — GLIPIZIDE ER 5 MG PO TB24: ORAL_TABLET | ORAL | 1 refills | Status: DC

## 2018-02-22 ENCOUNTER — Encounter: Payer: Self-pay | Admitting: Primary Care

## 2018-02-22 ENCOUNTER — Other Ambulatory Visit: Payer: Self-pay | Admitting: Primary Care

## 2018-02-22 DIAGNOSIS — F329 Major depressive disorder, single episode, unspecified: Secondary | ICD-10-CM

## 2018-02-22 DIAGNOSIS — F32A Depression, unspecified: Secondary | ICD-10-CM

## 2018-02-22 DIAGNOSIS — F419 Anxiety disorder, unspecified: Principal | ICD-10-CM

## 2018-02-23 MED ORDER — BUSPIRONE HCL 7.5 MG PO TABS
ORAL_TABLET | ORAL | 3 refills | Status: DC
Start: 2018-02-23 — End: 2018-08-11

## 2018-02-27 ENCOUNTER — Other Ambulatory Visit: Payer: Self-pay | Admitting: Primary Care

## 2018-02-27 DIAGNOSIS — E119 Type 2 diabetes mellitus without complications: Secondary | ICD-10-CM

## 2018-03-02 ENCOUNTER — Encounter: Payer: Medicare Other | Attending: Internal Medicine

## 2018-03-02 ENCOUNTER — Other Ambulatory Visit: Payer: Self-pay

## 2018-03-02 VITALS — Ht 62.5 in | Wt 115.4 lb

## 2018-03-02 DIAGNOSIS — Z85118 Personal history of other malignant neoplasm of bronchus and lung: Secondary | ICD-10-CM | POA: Insufficient documentation

## 2018-03-02 DIAGNOSIS — Z8673 Personal history of transient ischemic attack (TIA), and cerebral infarction without residual deficits: Secondary | ICD-10-CM | POA: Insufficient documentation

## 2018-03-02 DIAGNOSIS — Z87891 Personal history of nicotine dependence: Secondary | ICD-10-CM | POA: Diagnosis not present

## 2018-03-02 DIAGNOSIS — J449 Chronic obstructive pulmonary disease, unspecified: Secondary | ICD-10-CM | POA: Diagnosis not present

## 2018-03-02 DIAGNOSIS — Z7902 Long term (current) use of antithrombotics/antiplatelets: Secondary | ICD-10-CM | POA: Insufficient documentation

## 2018-03-02 DIAGNOSIS — F418 Other specified anxiety disorders: Secondary | ICD-10-CM | POA: Insufficient documentation

## 2018-03-02 DIAGNOSIS — Z79899 Other long term (current) drug therapy: Secondary | ICD-10-CM | POA: Diagnosis not present

## 2018-03-02 DIAGNOSIS — Z7984 Long term (current) use of oral hypoglycemic drugs: Secondary | ICD-10-CM | POA: Diagnosis not present

## 2018-03-02 NOTE — Progress Notes (Signed)
Pulmonary Individual Treatment Plan  Patient Details  Name: Amanda Porter MRN: [id number] Date of Birth: 1946-01-19 Referring Provider:     Pulmonary Rehab from 03/02/2018 in [facility] Cardiac and Pulmonary Rehab  Referring Provider  Ramachandran      Initial Encounter Date:    Pulmonary Rehab from 03/02/2018 in [facility] Cardiac and Pulmonary Rehab  Date  03/02/18  Referring Provider  Ashby Dawes      Visit Diagnosis: COPD with chronic bronchitis and emphysema ([facility] M. Cintron)  Patient's Home Medications on Admission:  Current Outpatient Medications:  .  albuterol (PROVENTIL HFA;VENTOLIN HFA) 108 (90 Base) MCG/ACT inhaler, TAKE 2 PUFFS BY MOUTH EVERY 4 HOURS AS NEEDED, Disp: 8.5 Inhaler, Rfl: 0 .  amLODipine (NORVASC) 5 MG tablet, Take 5 mg by mouth daily. , Disp: , Rfl:  .  busPIRone (BUSPAR) 7.5 MG tablet, Take 1 tablet by mouth twice daily for anxiety., Disp: 180 tablet, Rfl: 3 .  clopidogrel (PLAVIX) 75 MG tablet, TAKE 1 TABLET BY MOUTH EVERY DAY, Disp: 90 tablet, Rfl: 2 .  Fluticasone-Umeclidin-Vilant (TRELEGY ELLIPTA) 100-62.5-25 MCG/INH AEPB, Inhale 1 Inhaler into the lungs daily., Disp: 1 each, Rfl: 5 .  glipiZIDE (GLIPIZIDE XL) 5 MG 24 hr tablet, Take 1 tablet by mouth once daily with breakfast for diabetes., Disp: 90 tablet, Rfl: 1 .  ipratropium-albuterol (DUONEB) 0.5-2.5 (3) MG/3ML SOLN, Take 77ms by nebulization every 6 (six) hours and prn. Dx:J44.9, Disp: 360 mL, Rfl: 5 .  losartan (COZAAR) 100 MG tablet, Take 1 tablet (100 mg total) by mouth daily., Disp: 90 tablet, Rfl: 3 .  Melatonin 5 MG TABS, Take 5 mg by mouth at bedtime. , Disp: , Rfl:  .  Multiple Vitamin (MULTIVITAMIN) capsule, Take 1 capsule by mouth daily., Disp: , Rfl:  .  Omega-3 Fatty Acids (FISH OIL PO), Take 520 mg by mouth., Disp: , Rfl:  .  omeprazole (PRILOSEC) 20 MG capsule, Take 1 capsule (20 mg total) by mouth daily., Disp: 90 capsule, Rfl: 0 .  ONE TOUCH LANCETS MISC, 1 each by Other route 2 (two) times daily.,  Disp: 200 each, Rfl: 1 .  ONE TOUCH ULTRA TEST test strip, USE 1 TWICE A DAY AS INSTRUCTED, Disp: 100 each, Rfl: 5 .  OXYGEN, 2 L continuous. , Disp: , Rfl:  .  rosuvastatin (CRESTOR) 5 MG tablet, Take 1 tablet (5 mg total) by mouth every evening., Disp: 90 tablet, Rfl: 2 .  traZODone (DESYREL) 50 MG tablet, Take 1/2 to 1 tablet at bedtime for sleep., Disp: 30 tablet, Rfl: 0 .  hydrOXYzine (ATARAX/VISTARIL) 10 MG tablet, Take 1 tablet (10 mg total) by mouth 2 (two) times daily as needed for anxiety. (Patient not taking: Reported on 02/18/2018), Disp: 30 tablet, Rfl: 0  Past Medical History: Past Medical History:  Diagnosis Date  . Anxiety   . Anxiety and depression   . COPD (chronic obstructive pulmonary disease) ([place]   . CV[place] (cerebral vascular [place]) ([place]   . Dementia   . Dysphasia   . History of lung cancer   . Hyperlipidemia   . Memory disturbance 11/20/2017  . Normal pressure hydrocephalus 2018  . Retinal detachment    Right  . Right carotid bruit 11/20/2017  . Thrombosis    Arterial to lower extremity?    Tobacco Use: Social History   Tobacco Use  Smoking Status Former Smoker  . Packs/day: 0.50  . Years: 50.00  . Pack years: 25.00  . Last attempt to quit: 08/01/2017  . Years since  quitting: 0.5  Smokeless Tobacco Never Used    Labs: Recent Review Flowsheet Data    Labs for ITP Cardiac and Pulmonary Rehab Latest Ref Rng & Units 09/15/2017 11/26/2017 02/18/2018   Cholestrol 0 - 200 mg/dL 157 - -   LDLCALC 0 - 99 mg/dL 77 - -   HDL >39.00 mg/dL 58.80 - -   Trlycerides 0.0 - 149.0 mg/dL 105.0 - -   Hemoglobin A1c 4.6 - 6.5 % - 7.0(H) 7.5(H)       Pulmonary Assessment Scores: Pulmonary Assessment Scores    Row Name 03/02/18 1125         ADL UCSD   ADL Phase  Entry     SOB Score total  99     Rest  3     Walk  5     Stairs  5     Bath  3     Dress  4     Shop  4       CAT Score   CAT Score  34       mMRC Score   mMRC Score  1         Pulmonary Function Assessment: Pulmonary Function Assessment - 03/02/18 1150      Initial Spirometry Results   FVC%  62 %    FEV1%  56 %    FEV1/FVC Ratio  69.04    Comments  good patient effort      Post Bronchodilator Spirometry Results   FVC%  60.76 %    FEV1%  53.2 %    FEV1/FVC Ratio  66.65    Comments  good patient effort      Breath   Bilateral Breath Sounds  Clear    Shortness of Breath  Yes;Limiting activity;Panic with Shortness of Breath;Fear of Shortness of Breath       Exercise Target Goals: Date: 03/02/18  Exercise Program Goal: Individual exercise prescription set using results from initial 6 min walk test and THRR while considering  patient's activity barriers and safety.    Exercise Prescription Goal: Initial exercise prescription builds to 30-45 minutes a day of aerobic activity, 2-3 days per week.  Home exercise guidelines will be given to patient during program as part of exercise prescription that the participant will acknowledge.  Activity Barriers & Risk Stratification:   6 Minute Walk: 6 Minute Walk    Row Name 03/02/18 1256         6 Minute Walk   Distance  667 feet     Walk Time  5.75 minutes     # of Rest Breaks  2     MPH  1.31     METS  2.33     RPE  14     Perceived Dyspnea   2.5     VO2 Peak  8.16     Resting HR  102 bpm     Resting BP  132/64     Resting Oxygen Saturation   93 %     Exercise Oxygen Saturation  during 6 min walk  88 %     Max Ex. HR  133 bpm     Max Ex. BP  144/50     2 Minute Post BP  130/58       Interval HR   1 Minute HR  129     3 Minute HR  130     4 Minute HR  131  5 Minute HR  133     6 Minute HR  132     2 Minute Post HR  105     Interval Heart Rate?  Yes       Interval Oxygen   Interval Oxygen?  Yes     Baseline Oxygen Saturation %  93 %     1 Minute Oxygen Saturation %  89 %     1 Minute Liters of Oxygen  2 L     2 Minute Liters of Oxygen  2 L     3 Minute Oxygen Saturation %  90  %     3 Minute Liters of Oxygen  2 L     4 Minute Oxygen Saturation %  89 %     4 Minute Liters of Oxygen  2 L     5 Minute Oxygen Saturation %  88 %     5 Minute Liters of Oxygen  2 L     6 Minute Oxygen Saturation %  91 %     6 Minute Liters of Oxygen  2 L     2 Minute Post Oxygen Saturation %  99 %     2 Minute Post Liters of Oxygen  2 L       Oxygen Initial Assessment: Oxygen Initial Assessment - 03/02/18 1151      Home Oxygen   Home Oxygen Device  Portable Concentrator;Home Concentrator;E-Tanks    Sleep Oxygen Prescription  Continuous    Liters per minute  2    Home Exercise Oxygen Prescription  Continuous    Liters per minute  2    Home at Rest Exercise Oxygen Prescription  Continuous    Liters per minute  2    Compliance with Home Oxygen Use  Yes      Initial 6 min Walk   Oxygen Used  Continuous    Liters per minute  2      Program Oxygen Prescription   Program Oxygen Prescription  Continuous    Liters per minute  2      Intervention   Short Term Goals  To learn and exhibit compliance with exercise, home and travel O2 prescription;To learn and understand importance of maintaining oxygen saturations>88%;To learn and demonstrate proper use of respiratory medications;To learn and demonstrate proper pursed lip breathing techniques or other breathing techniques.;To learn and understand importance of monitoring SPO2 with pulse oximeter and demonstrate accurate use of the pulse oximeter.    Long  Term Goals  Exhibits compliance with exercise, home and travel O2 prescription;Verbalizes importance of monitoring SPO2 with pulse oximeter and return demonstration;Maintenance of O2 saturations>88%;Exhibits proper breathing techniques, such as pursed lip breathing or other method taught during program session;Compliance with respiratory medication;Demonstrates proper use of MDI's       Oxygen Re-Evaluation:   Oxygen Discharge (Final Oxygen Re-Evaluation):   Initial Exercise  Prescription: Initial Exercise Prescription - 03/02/18 1200      Date of Initial Exercise RX and Referring Provider   Date  03/02/18    Referring Provider  Ashby Dawes      Oxygen   Oxygen  Continuous    Liters  2      Treadmill   MPH  1    Grade  0.5    Minutes  15      NuStep   Level  1    SPM  80    Minutes  15    METs  2  Biostep-RELP   Level  2    SPM  50    Minutes  15    METs  2      Prescription Details   Frequency (times per week)  3    Duration  Progress to 45 minutes of aerobic exercise without signs/symptoms of physical distress      Intensity   THRR 40-80% of Max Heartrate  120-138    Ratings of Perceived Exertion  11-15    Perceived Dyspnea  0-4      Resistance Training   Training Prescription  Yes    Weight  3 lb    Reps  10-15       Perform Capillary Blood Glucose checks as needed.  Exercise Prescription Changes:   Exercise Comments:   Exercise Goals and Review: Exercise Goals    Row Name 03/02/18 1255             Exercise Goals   Increase Physical Activity  Yes       Intervention  Provide advice, education, support and counseling about physical activity/exercise needs.;Develop an individualized exercise prescription for aerobic and resistive training based on initial evaluation findings, risk stratification, comorbidities and participant's personal goals.       Expected Outcomes  Short Term: Attend rehab on a regular basis to increase amount of physical activity.;Long Term: Add in home exercise to make exercise part of routine and to increase amount of physical activity.;Long Term: Exercising regularly at least 3-5 days a week.       Increase Strength and Stamina  Yes       Intervention  Provide advice, education, support and counseling about physical activity/exercise needs.;Develop an individualized exercise prescription for aerobic and resistive training based on initial evaluation findings, risk stratification, comorbidities  and participant's personal goals.       Expected Outcomes  Short Term: Increase workloads from initial exercise prescription for resistance, speed, and METs.;Short Term: Perform resistance training exercises routinely during rehab and add in resistance training at home;Long Term: Improve cardiorespiratory fitness, muscular endurance and strength as measured by increased METs and functional capacity (6MWT)       Able to understand and use rate of perceived exertion (RPE) scale  Yes       Intervention  Provide education and explanation on how to use RPE scale       Expected Outcomes  Short Term: Able to use RPE daily in rehab to express subjective intensity level;Long Term:  Able to use RPE to guide intensity level when exercising independently       Able to understand and use Dyspnea scale  Yes       Intervention  Provide education and explanation on how to use Dyspnea scale       Expected Outcomes  Short Term: Able to use Dyspnea scale daily in rehab to express subjective sense of shortness of breath during exertion;Long Term: Able to use Dyspnea scale to guide intensity level when exercising independently       Knowledge and understanding of Target Heart Rate Range (THRR)  Yes       Intervention  Provide education and explanation of THRR including how the numbers were predicted and where they are located for reference       Expected Outcomes  Short Term: Able to state/look up THRR;Long Term: Able to use THRR to govern intensity when exercising independently;Short Term: Able to use daily as guideline for intensity in rehab  Able to check pulse independently  Yes       Intervention  Provide education and demonstration on how to check pulse in carotid and radial arteries.;Review the importance of being able to check your own pulse for safety during independent exercise       Expected Outcomes  Short Term: Able to explain why pulse checking is important during independent exercise;Long Term: Able to  check pulse independently and accurately       Understanding of Exercise Prescription  Yes       Intervention  Provide education, explanation, and written materials on patient's individual exercise prescription       Expected Outcomes  Short Term: Able to explain program exercise prescription;Long Term: Able to explain home exercise prescription to exercise independently          Exercise Goals Re-Evaluation :   Discharge Exercise Prescription (Final Exercise Prescription Changes):   Nutrition:  Target Goals: Understanding of nutrition guidelines, daily intake of sodium <1543m, cholesterol <2021m calories 30% from fat and 7% or less from saturated fats, daily to have 5 or more servings of fruits and vegetables.  Biometrics: Pre Biometrics - 03/02/18 1254      Pre Biometrics   Height  5' 2.5" (1.588 m)    Weight  115 lb 6.4 oz (52.3 kg)    Waist Circumference  31.5 inches    Hip Circumference  34.5 inches    Waist to Hip Ratio  0.91 %    BMI (Calculated)  20.76        Nutrition Therapy Plan and Nutrition Goals: Nutrition Therapy & Goals - 03/02/18 1152      Intervention Plan   Intervention  Prescribe, educate and counsel regarding individualized specific dietary modifications aiming towards targeted core components such as weight, hypertension, lipid management, diabetes, heart failure and other comorbidities.    Expected Outcomes  Short Term Goal: Understand basic principles of dietary content, such as calories, fat, sodium, cholesterol and nutrients.;Short Term Goal: A plan has been developed with personal nutrition goals set during dietitian appointment.;Long Term Goal: Adherence to prescribed nutrition plan.       Nutrition Assessments: Nutrition Assessments - 03/02/18 1142      MEDFICTS Scores   Pre Score  34       Nutrition Goals Re-Evaluation:   Nutrition Goals Discharge (Final Nutrition Goals Re-Evaluation):   Psychosocial: Target Goals: Acknowledge  presence or absence of significant depression and/or stress, maximize coping skills, provide positive support system. Participant is able to verbalize types and ability to use techniques and skills needed for reducing stress and depression.   Initial Review & Psychosocial Screening: Initial Psych Review & Screening - 03/02/18 1154      Initial Review   Current issues with  Current Depression;Current Anxiety/Panic;Current Psychotropic Meds;Current Sleep Concerns;Current Stress Concerns    Source of Stress Concerns  Chronic Illness;Unable to perform yard/household activities;Unable to participate in former interests or hobbies    Comments  Wants to be able to move and do what she wants without her shortness of breath      [place] Lives with Daughter As[place]aryl [place] family support too      Barriers   Psychosocial barriers to participate in program  There are no identifiable barriers or psychosocial needs.;The patient should benefit from training in stress management and relaxation.      Screening Interventions   Interventions  Encouraged to exercise;Program counselor consult;To provide support and  resources with identified psychosocial needs;Provide feedback about the scores to participant    Expected Outcomes  Short Term goal: Utilizing psychosocial counselor, staff and physician to assist with identification of specific Stressors or current issues interfering with healing process. Setting desired goal for each stressor or current issue identified.;Long Term Goal: Stressors or current issues are controlled or eliminated.;Short Term goal: Identification and review with participant of any Quality of Life or Depression concerns found by scoring the questionnaire.;Long Term goal: The participant improves quality of Life and PHQ9 Scores as seen by post scores and/or verbalization of changes       Quality of Life Scores:  Scores of 19 and below usually indicate a  poorer quality of life in these areas.  A difference of  2-3 points is a clinically meaningful difference.  A difference of 2-3 points in the total score of the Quality of Life Index has been associated with significant improvement in overall quality of life, self-image, physical symptoms, and general health in studies assessing change in quality of life.  PHQ-9: Recent Review Flowsheet Data    Depression screen [facility] 2/9 03/02/2018   Decreased Interest 3   Down, Depressed, Hopeless 3   PHQ - 2 Score 6   Altered sleeping 3   Tired, decreased energy 3   Change in appetite 3   Feeling bad or failure about yourself  1   Trouble concentrating 1   Moving slowly or fidgety/restless 1   Suicidal thoughts 0   PHQ-9 Score 18   Difficult doing work/chores Extremely dIfficult     Interpretation of Total Score  Total Score Depression Severity:  1-4 = Minimal depression, 5-9 = Mild depression, 10-14 = Moderate depression, 15-19 = Moderately severe depression, 20-27 = Severe depression   Psychosocial Evaluation and Intervention:   Psychosocial Re-Evaluation:   Psychosocial Discharge (Final Psychosocial Re-Evaluation):   Education: Education Goals: Education classes will be provided on a weekly basis, covering required topics. Participant will state understanding/return demonstration of topics presented.  Learning Barriers/Preferences: Learning Barriers/Preferences - 03/02/18 1127      Learning Barriers/Preferences   Learning Barriers  Hearing bad hearing out of left ear    Learning Preferences  None;Verbal Instruction       Education Topics:  Initial Evaluation Education: - Verbal, written and demonstration of respiratory meds, oximetry and breathing techniques. Instruction on use of nebulizers and MDIs and importance of monitoring MDI activations.   Pulmonary Rehab from 03/02/2018 in [facility] Cardiac and Pulmonary Rehab  Date  03/02/18  Educator  [facility]  Instruction Review Code  1- Verbalizes  Understanding      General Nutrition Guidelines/Fats and Fiber: -Group instruction provided by verbal, written material, models and posters to present the general guidelines for heart healthy nutrition. Gives an explanation and review of dietary fats and fiber.   Controlling Sodium/Reading Food Labels: -Group verbal and written material supporting the discussion of sodium use in heart healthy nutrition. Review and explanation with models, verbal and written materials for utilization of the food label.   Exercise Physiology & General Exercise Guidelines: - Group verbal and written instruction with models to review the exercise physiology of the cardiovascular system and associated critical values. Provides general exercise guidelines with specific guidelines to those with heart or lung disease.    Aerobic Exercise & Resistance Training: - Gives group verbal and written instruction on the various components of exercise. Focuses on aerobic and resistive training programs and the benefits of this training and how  to safely progress through these programs.   Flexibility, Balance, Mind/Body Relaxation: Provides group verbal/written instruction on the benefits of flexibility and balance training, including mind/body exercise modes such as yoga, pilates and tai chi.  Demonstration and skill practice provided.   Stress and Anxiety: - Provides group verbal and written instruction about the health risks of elevated stress and causes of high stress.  Discuss the correlation between heart/lung disease and anxiety and treatment options. Review healthy ways to manage with stress and anxiety.   Depression: - Provides group verbal and written instruction on the correlation between heart/lung disease and depressed mood, treatment options, and the stigmas associated with seeking treatment.   Exercise & Equipment Safety: - Individual verbal instruction and demonstration of equipment use and safety with  use of the equipment.   Pulmonary Rehab from 03/02/2018 in [facility] Cardiac and Pulmonary Rehab  Date  03/02/18  Educator  [facility]  Instruction Review Code  1- Verbalizes Understanding      Infection Prevention: - Provides verbal and written material to individual with discussion of infection control including proper hand washing and proper equipment cleaning during exercise session.   Pulmonary Rehab from 03/02/2018 in [facility] Cardiac and Pulmonary Rehab  Date  03/02/18  Educator  [facility]  Instruction Review Code  1- Verbalizes Understanding      Falls Prevention: - Provides verbal and written material to individual with discussion of falls prevention and safety.   Pulmonary Rehab from 03/02/2018 in [facility] Cardiac and Pulmonary Rehab  Date  03/02/18  Educator  [facility]  Instruction Review Code  1- Verbalizes Understanding      Diabetes: - Individual verbal and written instruction to review signs/symptoms of diabetes, desired ranges of glucose level fasting, after meals and with exercise. Advice that pre and post exercise glucose checks will be done for 3 sessions at entry of program.   Pulmonary Rehab from 03/02/2018 in [facility] Cardiac and Pulmonary Rehab  Date  03/02/18  Educator  [facility]  Instruction Review Code  1- Verbalizes Understanding      Chronic Lung Diseases: - Group verbal and written instruction to review updates, respiratory medications, advancements in procedures and treatments. Discuss use of supplemental oxygen including available portable oxygen systems, continuous and intermittent flow rates, concentrators, personal use and safety guidelines. Review proper use of inhaler and spacers. Provide informative websites for self-education.    Energy Conservation: - Provide group verbal and written instruction for methods to conserve energy, plan and organize activities. Instruct on pacing techniques, use of adaptive equipment and posture/positioning to relieve shortness of breath.   Triggers and  Exacerbations: - Group verbal and written instruction to review types of environmental triggers and ways to prevent exacerbations. Discuss weather changes, air quality and the benefits of nasal washing. Review warning signs and symptoms to help prevent infections. Discuss techniques for effective airway clearance, coughing, and vibrations.   AED/CPR: - Group verbal and written instruction with the use of models to demonstrate the basic use of the AED with the basic ABC's of resuscitation.   Anatomy and Physiology of the Lungs: - Group verbal and written instruction with the use of models to provide basic lung anatomy and physiology related to function, structure and complications of lung disease.   Anatomy & Physiology of the Heart: - Group verbal and written instruction and models provide basic cardiac anatomy and physiology, with the coronary electrical and arterial systems. Review of Valvular disease and Heart Failure   Cardiac Medications: - Group verbal and  written instruction to review commonly prescribed medications for heart disease. Reviews the medication, class of the drug, and side effects.   Know Your Numbers and Risk Factors: -Group verbal and written instruction about important numbers in your health.  Discussion of what are risk factors and how they play a role in the disease process.  Review of Cholesterol, Blood Pressure, Diabetes, and BMI and the role they play in your overall health.   Sleep Hygiene: -Provides group verbal and written instruction about how sleep can affect your health.  Define sleep hygiene, discuss sleep cycles and impact of sleep habits. Review good sleep hygiene tips.    Other: -Provides group and verbal instruction on various topics (see comments)    Knowledge Questionnaire Score: Knowledge Questionnaire Score - 03/02/18 1130      Knowledge Questionnaire Score   Pre Score  16/18 reviewed with patient        Core Components/Risk  Factors/Patient Goals at Admission: Personal Goals and Risk Factors at Admission - 03/02/18 1153      Core Components/Risk Factors/Patient Goals on Admission    Weight Management  Yes;Weight Maintenance    Intervention  Weight Management: Develop a combined nutrition and exercise program designed to reach desired caloric intake, while maintaining appropriate intake of nutrient and fiber, sodium and fats, and appropriate energy expenditure required for the weight goal.;Weight Management: Provide education and appropriate resources to help participant work on and attain dietary goals.;Weight Management/Obesity: Establish reasonable short term and long term weight goals.    Admit Weight  115 lb 6.4 oz (52.3 kg)    Goal Weight: Short Term  115 lb (52.2 kg)    Goal Weight: Long Term  115 lb (52.2 kg)    Expected Outcomes  Short Term: Continue to assess and modify interventions until short term weight is achieved;Weight Maintenance: Understanding of the daily nutrition guidelines, which includes 25-35% calories from fat, 7% or less cal from saturated fats, less than 267m cholesterol, less than 1.5gm of sodium, & 5 or more servings of fruits and vegetables daily;Understanding recommendations for meals to include 15-35% energy as protein, 25-35% energy from fat, 35-60% energy from carbohydrates, less than 2023mof dietary cholesterol, 20-35 gm of total fiber daily;Understanding of distribution of calorie intake throughout the day with the consumption of 4-5 meals/snacks    Tobacco Cessation  Yes    Number of packs per day  0 Quit Sept 2018  History pack a day 50 years    Expected Outcomes  Short Term: Will quit all tobacco product use, adhering to prevention of relapse plan.;Long Term: Complete abstinence from all tobacco products for at least 12 months from quit date.    Improve shortness of breath with ADL's  Yes    Intervention  Provide education, individualized exercise plan and daily activity instruction  to help decrease symptoms of SOB with activities of daily living.    Expected Outcomes  Short Term: Improve cardiorespiratory fitness to achieve a reduction of symptoms when performing ADLs;Long Term: Be able to perform more ADLs without symptoms or delay the onset of symptoms    Diabetes  Yes    Intervention  Provide education about signs/symptoms and action to take for hypo/hyperglycemia.;Provide education about proper nutrition, including hydration, and aerobic/resistive exercise prescription along with prescribed medications to achieve blood glucose in normal ranges: Fasting glucose 65-99 mg/dL    Expected Outcomes  Short Term: Participant verbalizes understanding of the signs/symptoms and immediate care of hyper/hypoglycemia, proper foot care and  importance of medication, aerobic/resistive exercise and nutrition plan for blood glucose control.;Long Term: Attainment of HbA1C < 7%.    Lipids  Yes    Intervention  Provide education and support for participant on nutrition & aerobic/resistive exercise along with prescribed medications to achieve LDL '70mg'$ , HDL >'40mg'$ .    Expected Outcomes  Short Term: Participant states understanding of desired cholesterol values and is compliant with medications prescribed. Participant is following exercise prescription and nutrition guidelines.;Long Term: Cholesterol controlled with medications as prescribed, with individualized exercise RX and with personalized nutrition plan. Value goals: LDL < '70mg'$ , HDL > 40 mg.    Stress  Yes Stress dealing with inability to move as fast as she wants,being limited by shortness of breath    Intervention  Offer individual and/or small group education and counseling on adjustment to heart disease, stress management and health-related lifestyle change. Teach and support self-help strategies.;Refer participants experiencing significant psychosocial distress to appropriate mental health specialists for further evaluation and treatment. When  possible, include family members and significant others in education/counseling sessions.    Expected Outcomes  Short Term: Participant demonstrates changes in health-related behavior, relaxation and other stress management skills, ability to obtain effective social support, and compliance with psychotropic medications if prescribed.;Long Term: Emotional wellbeing is indicated by absence of clinically significant psychosocial distress or social isolation.       Core Components/Risk Factors/Patient Goals Review:    Core Components/Risk Factors/Patient Goals at Discharge (Final Review):    ITP Comments: ITP Comments    Row Name 03/02/18 1123           ITP Comments  Medical Evaluation completed. Chart sent for review and changes to Dr. Emily Filbert Director of [facility]. Diagnosis can be found in [facility] encounter 12/30/17          Comments: Initial ITP

## 2018-03-02 NOTE — Patient Instructions (Signed)
Patient Instructions  Patient Details  Name: Amanda Porter MRN: [id number] Date of Birth: 07-28-1946 Referring Provider:  Laverle Hobby, *  Below are your personal goals for exercise, nutrition, and risk factors. Our goal is to help you stay on track towards obtaining and maintaining these goals. We will be discussing your progress on these goals with you throughout the program.  Initial Exercise Prescription: Initial Exercise Prescription - 03/02/18 1200      Date of Initial Exercise RX and Referring Provider   Date  03/02/18    Referring Provider  Ashby Dawes      Oxygen   Oxygen  Continuous    Liters  2      Treadmill   MPH  1    Grade  0.5    Minutes  15      NuStep   Level  1    SPM  80    Minutes  15    METs  2      Biostep-RELP   Level  2    SPM  50    Minutes  15    METs  2      Prescription Details   Frequency (times per week)  3    Duration  Progress to 45 minutes of aerobic exercise without signs/symptoms of physical distress      Intensity   THRR 40-80% of Max Heartrate  120-138    Ratings of Perceived Exertion  11-15    Perceived Dyspnea  0-4      Resistance Training   Training Prescription  Yes    Weight  3 lb    Reps  10-15       Exercise Goals: Frequency: Be able to perform aerobic exercise two to three times per week in program working toward 2-5 days per week of home exercise.  Intensity: Work with a perceived exertion of 11 (fairly light) - 15 (hard) while following your exercise prescription.  We will make changes to your prescription with you as you progress through the program.   Duration: Be able to do 30 to 45 minutes of continuous aerobic exercise in addition to a 5 minute warm-up and a 5 minute cool-down routine.   Nutrition Goals: Your personal nutrition goals will be established when you do your nutrition analysis with the dietician.  The following are general nutrition guidelines to follow: Cholesterol <  200mg /day Sodium < 1500mg /day Fiber: Women over 50 yrs - 21 grams per day  Personal Goals: Personal Goals and Risk Factors at Admission - 03/02/18 1153      Core Components/Risk Factors/Patient Goals on Admission    Weight Management  Yes;Weight Maintenance    Intervention  Weight Management: Develop a combined nutrition and exercise program designed to reach desired caloric intake, while maintaining appropriate intake of nutrient and fiber, sodium and fats, and appropriate energy expenditure required for the weight goal.;Weight Management: Provide education and appropriate resources to help participant work on and attain dietary goals.;Weight Management/Obesity: Establish reasonable short term and long term weight goals.    Admit Weight  115 lb 6.4 oz (52.3 kg)    Goal Weight: Short Term  115 lb (52.2 kg)    Goal Weight: Long Term  115 lb (52.2 kg)    Expected Outcomes  Short Term: Continue to assess and modify interventions until short term weight is achieved;Weight Maintenance: Understanding of the daily nutrition guidelines, which includes 25-35% calories from fat, 7% or less cal from saturated fats, less than  200mg  cholesterol, less than 1.5gm of sodium, & 5 or more servings of fruits and vegetables daily;Understanding recommendations for meals to include 15-35% energy as protein, 25-35% energy from fat, 35-60% energy from carbohydrates, less than 200mg  of dietary cholesterol, 20-35 gm of total fiber daily;Understanding of distribution of calorie intake throughout the day with the consumption of 4-5 meals/snacks    Tobacco Cessation  Yes    Number of packs per day  0 Quit Sept 2018  History pack a day 50 years    Expected Outcomes  Short Term: Will quit all tobacco product use, adhering to prevention of relapse plan.;Long Term: Complete abstinence from all tobacco products for at least 12 months from quit date.    Improve shortness of breath with ADL's  Yes    Intervention  Provide education,  individualized exercise plan and daily activity instruction to help decrease symptoms of SOB with activities of daily living.    Expected Outcomes  Short Term: Improve cardiorespiratory fitness to achieve a reduction of symptoms when performing ADLs;Long Term: Be able to perform more ADLs without symptoms or delay the onset of symptoms    Diabetes  Yes    Intervention  Provide education about signs/symptoms and action to take for hypo/hyperglycemia.;Provide education about proper nutrition, including hydration, and aerobic/resistive exercise prescription along with prescribed medications to achieve blood glucose in normal ranges: Fasting glucose 65-99 mg/dL    Expected Outcomes  Short Term: Participant verbalizes understanding of the signs/symptoms and immediate care of hyper/hypoglycemia, proper foot care and importance of medication, aerobic/resistive exercise and nutrition plan for blood glucose control.;Long Term: Attainment of HbA1C < 7%.    Lipids  Yes    Intervention  Provide education and support for participant on nutrition & aerobic/resistive exercise along with prescribed medications to achieve LDL 70mg , HDL >40mg .    Expected Outcomes  Short Term: Participant states understanding of desired cholesterol values and is compliant with medications prescribed. Participant is following exercise prescription and nutrition guidelines.;Long Term: Cholesterol controlled with medications as prescribed, with individualized exercise RX and with personalized nutrition plan. Value goals: LDL < 70mg , HDL > 40 mg.    Stress  Yes Stress dealing with inability to move as fast as she wants,being limited by shortness of breath    Intervention  Offer individual and/or small group education and counseling on adjustment to heart disease, stress management and health-related lifestyle change. Teach and support self-help strategies.;Refer participants experiencing significant psychosocial distress to appropriate mental  health specialists for further evaluation and treatment. When possible, include family members and significant others in education/counseling sessions.    Expected Outcomes  Short Term: Participant demonstrates changes in health-related behavior, relaxation and other stress management skills, ability to obtain effective social support, and compliance with psychotropic medications if prescribed.;Long Term: Emotional wellbeing is indicated by absence of clinically significant psychosocial distress or social isolation.       Tobacco Use Initial Evaluation: Social History   Tobacco Use  Smoking Status Former Smoker  . Packs/day: 0.50  . Years: 50.00  . Pack years: 25.00  . Last attempt to quit: 08/01/2017  . Years since quitting: 0.5  Smokeless Tobacco Never Used    Exercise Goals and Review: Exercise Goals    Row Name 03/02/18 1255             Exercise Goals   Increase Physical Activity  Yes       Intervention  Provide advice, education, support and counseling about physical activity/exercise needs.;Develop  an individualized exercise prescription for aerobic and resistive training based on initial evaluation findings, risk stratification, comorbidities and participant's personal goals.       Expected Outcomes  Short Term: Attend rehab on a regular basis to increase amount of physical activity.;Long Term: Add in home exercise to make exercise part of routine and to increase amount of physical activity.;Long Term: Exercising regularly at least 3-5 days a week.       Increase Strength and Stamina  Yes       Intervention  Provide advice, education, support and counseling about physical activity/exercise needs.;Develop an individualized exercise prescription for aerobic and resistive training based on initial evaluation findings, risk stratification, comorbidities and participant's personal goals.       Expected Outcomes  Short Term: Increase workloads from initial exercise prescription for  resistance, speed, and METs.;Short Term: Perform resistance training exercises routinely during rehab and add in resistance training at home;Long Term: Improve cardiorespiratory fitness, muscular endurance and strength as measured by increased METs and functional capacity (6MWT)       Able to understand and use rate of perceived exertion (RPE) scale  Yes       Intervention  Provide education and explanation on how to use RPE scale       Expected Outcomes  Short Term: Able to use RPE daily in rehab to express subjective intensity level;Long Term:  Able to use RPE to guide intensity level when exercising independently       Able to understand and use Dyspnea scale  Yes       Intervention  Provide education and explanation on how to use Dyspnea scale       Expected Outcomes  Short Term: Able to use Dyspnea scale daily in rehab to express subjective sense of shortness of breath during exertion;Long Term: Able to use Dyspnea scale to guide intensity level when exercising independently       Knowledge and understanding of Target Heart Rate Range (THRR)  Yes       Intervention  Provide education and explanation of THRR including how the numbers were predicted and where they are located for reference       Expected Outcomes  Short Term: Able to state/look up THRR;Long Term: Able to use THRR to govern intensity when exercising independently;Short Term: Able to use daily as guideline for intensity in rehab       Able to check pulse independently  Yes       Intervention  Provide education and demonstration on how to check pulse in carotid and radial arteries.;Review the importance of being able to check your own pulse for safety during independent exercise       Expected Outcomes  Short Term: Able to explain why pulse checking is important during independent exercise;Long Term: Able to check pulse independently and accurately       Understanding of Exercise Prescription  Yes       Intervention  Provide education,  explanation, and written materials on patient's individual exercise prescription       Expected Outcomes  Short Term: Able to explain program exercise prescription;Long Term: Able to explain home exercise prescription to exercise independently          Copy of goals given to participant.

## 2018-03-08 DIAGNOSIS — J449 Chronic obstructive pulmonary disease, unspecified: Secondary | ICD-10-CM

## 2018-03-08 NOTE — Progress Notes (Signed)
Pulmonary Individual Treatment Plan  Patient Details  Name: Amanda Porter MRN: [id number] Date of Birth: Feb 17, 1946 Referring Provider:     Pulmonary Rehab from 03/02/2018 in [facility] Cardiac and Pulmonary Rehab  Referring Provider  Ramachandran      Initial Encounter Date:    Pulmonary Rehab from 03/02/2018 in [facility] Cardiac and Pulmonary Rehab  Date  03/02/18  Referring Provider  Ashby Dawes      Visit Diagnosis: COPD with chronic bronchitis and emphysema ([place])  Patient's Home Medications on Admission:  Current Outpatient Medications:  .  albuterol (PROVENTIL HFA;VENTOLIN HFA) 108 (90 Base) MCG/ACT inhaler, TAKE 2 PUFFS BY MOUTH EVERY 4 HOURS AS NEEDED, Disp: 8.5 Inhaler, Rfl: 0 .  amLODipine (NORVASC) 5 MG tablet, Take 5 mg by mouth daily. , Disp: , Rfl:  .  busPIRone (BUSPAR) 7.5 MG tablet, Take 1 tablet by mouth twice daily for anxiety., Disp: 180 tablet, Rfl: 3 .  clopidogrel (PLAVIX) 75 MG tablet, TAKE 1 TABLET BY MOUTH EVERY DAY, Disp: 90 tablet, Rfl: 2 .  Fluticasone-Umeclidin-Vilant (TRELEGY ELLIPTA) 100-62.5-25 MCG/INH AEPB, Inhale 1 Inhaler into the lungs daily., Disp: 1 each, Rfl: 5 .  glipiZIDE (GLIPIZIDE XL) 5 MG 24 hr tablet, Take 1 tablet by mouth once daily with breakfast for diabetes., Disp: 90 tablet, Rfl: 1 .  hydrOXYzine (ATARAX/VISTARIL) 10 MG tablet, Take 1 tablet (10 mg total) by mouth 2 (two) times daily as needed for anxiety. (Patient not taking: Reported on 02/18/2018), Disp: 30 tablet, Rfl: 0 .  ipratropium-albuterol (DUONEB) 0.5-2.5 (3) MG/3ML SOLN, Take 61ms by nebulization every 6 (six) hours and prn. Dx:J44.9, Disp: 360 mL, Rfl: 5 .  losartan (COZAAR) 100 MG tablet, Take 1 tablet (100 mg total) by mouth daily., Disp: 90 tablet, Rfl: 3 .  Melatonin 5 MG TABS, Take 5 mg by mouth at bedtime. , Disp: , Rfl:  .  Multiple Vitamin (MULTIVITAMIN) capsule, Take 1 capsule by mouth daily., Disp: , Rfl:  .  Omega-3 Fatty Acids (FISH OIL PO), Take 520 mg by mouth.,  Disp: , Rfl:  .  omeprazole (PRILOSEC) 20 MG capsule, Take 1 capsule (20 mg total) by mouth daily., Disp: 90 capsule, Rfl: 0 .  ONE TOUCH LANCETS MISC, 1 each by Other route 2 (two) times daily., Disp: 200 each, Rfl: 1 .  ONE TOUCH ULTRA TEST test strip, USE 1 TWICE A DAY AS INSTRUCTED, Disp: 100 each, Rfl: 5 .  OXYGEN, 2 L continuous. , Disp: , Rfl:  .  rosuvastatin (CRESTOR) 5 MG tablet, Take 1 tablet (5 mg total) by mouth every evening., Disp: 90 tablet, Rfl: 2 .  traZODone (DESYREL) 50 MG tablet, Take 1/2 to 1 tablet at bedtime for sleep., Disp: 30 tablet, Rfl: 0  Past Medical History: Past Medical History:  Diagnosis Date  . Anxiety   . Anxiety and depression   . COPD (chronic obstructive pulmonary disease) ([place]   . CVA (cerebral vascular accident) ([place]   . Dementia   . Dysphasia   . History of lung cancer   . Hyperlipidemia   . Memory disturbance 11/20/2017  . Normal pressure hydrocephalus 2018  . Retinal detachment    Right  . Right carotid bruit 11/20/2017  . Thrombosis    Arterial to lower extremity?    Tobacco Use: Social History   Tobacco Use  Smoking Status Former Smoker  . Packs/day: 0.50  . Years: 50.00  . Pack years: 25.00  . Last attempt to quit: 08/01/2017  . Years since  quitting: 0.6  Smokeless Tobacco Never Used    Labs: Recent Review Flowsheet Data    Labs for ITP Cardiac and Pulmonary Rehab Latest Ref Rng & Units 09/15/2017 11/26/2017 02/18/2018   Cholestrol 0 - 200 mg/dL 157 - -   LDLCALC 0 - 99 mg/dL 77 - -   HDL >39.00 mg/dL 58.80 - -   Trlycerides 0.0 - 149.0 mg/dL 105.0 - -   Hemoglobin A1c 4.6 - 6.5 % - 7.0(H) 7.5(H)       Pulmonary Assessment Scores: Pulmonary Assessment Scores    Row Name 03/02/18 1125         ADL UCSD   ADL Phase  Entry     SOB Score total  99     Rest  3     Walk  5     Stairs  5     Bath  3     Dress  4     Shop  4       CAT Score   CAT Score  34       mMRC Score   mMRC Score  1         Pulmonary Function Assessment: Pulmonary Function Assessment - 03/02/18 1150      Initial Spirometry Results   FVC%  62 %    FEV1%  56 %    FEV1/FVC Ratio  69.04    Comments  good patient effort      Post Bronchodilator Spirometry Results   FVC%  60.76 %    FEV1%  53.2 %    FEV1/FVC Ratio  66.65    Comments  good patient effort      Breath   Bilateral Breath Sounds  Clear    Shortness of Breath  Yes;Limiting activity;Panic with Shortness of Breath;Fear of Shortness of Breath       Exercise Target Goals:    Exercise Program Goal: Individual exercise prescription set using results from initial 6 min walk test and THRR while considering  patient's activity barriers and safety.    Exercise Prescription Goal: Initial exercise prescription builds to 30-45 minutes a day of aerobic activity, 2-3 days per week.  Home exercise guidelines will be given to patient during program as part of exercise prescription that the participant will acknowledge.  Activity Barriers & Risk Stratification:   6 Minute Walk: 6 Minute Walk    Row Name 03/02/18 1256         6 Minute Walk   Distance  667 feet     Walk Time  5.75 minutes     # of Rest Breaks  2     MPH  1.31     METS  2.33     RPE  14     Perceived Dyspnea   2.5     VO2 Peak  8.16     Resting HR  102 bpm     Resting BP  132/64     Resting Oxygen Saturation   93 %     Exercise Oxygen Saturation  during 6 min walk  88 %     Max Ex. HR  133 bpm     Max Ex. BP  144/50     2 Minute Post BP  130/58       Interval HR   1 Minute HR  129     3 Minute HR  130     4 Minute HR  131  5 Minute HR  133     6 Minute HR  132     2 Minute Post HR  105     Interval Heart Rate?  Yes       Interval Oxygen   Interval Oxygen?  Yes     Baseline Oxygen Saturation %  93 %     1 Minute Oxygen Saturation %  89 %     1 Minute Liters of Oxygen  2 L     2 Minute Liters of Oxygen  2 L     3 Minute Oxygen Saturation %  90 %     3  Minute Liters of Oxygen  2 L     4 Minute Oxygen Saturation %  89 %     4 Minute Liters of Oxygen  2 L     5 Minute Oxygen Saturation %  88 %     5 Minute Liters of Oxygen  2 L     6 Minute Oxygen Saturation %  91 %     6 Minute Liters of Oxygen  2 L     2 Minute Post Oxygen Saturation %  99 %     2 Minute Post Liters of Oxygen  2 L       Oxygen Initial Assessment: Oxygen Initial Assessment - 03/02/18 1151      Home Oxygen   Home Oxygen Device  Portable Concentrator;Home Concentrator;E-Tanks    Sleep Oxygen Prescription  Continuous    Liters per minute  2    Home Exercise Oxygen Prescription  Continuous    Liters per minute  2    Home at Rest Exercise Oxygen Prescription  Continuous    Liters per minute  2    Compliance with Home Oxygen Use  Yes      Initial 6 min Walk   Oxygen Used  Continuous    Liters per minute  2      Program Oxygen Prescription   Program Oxygen Prescription  Continuous    Liters per minute  2      Intervention   Short Term Goals  To learn and exhibit compliance with exercise, home and travel O2 prescription;To learn and understand importance of maintaining oxygen saturations>88%;To learn and demonstrate proper use of respiratory medications;To learn and demonstrate proper pursed lip breathing techniques or other breathing techniques.;To learn and understand importance of monitoring SPO2 with pulse oximeter and demonstrate accurate use of the pulse oximeter.    Long  Term Goals  Exhibits compliance with exercise, home and travel O2 prescription;Verbalizes importance of monitoring SPO2 with pulse oximeter and return demonstration;Maintenance of O2 saturations>88%;Exhibits proper breathing techniques, such as pursed lip breathing or other method taught during program session;Compliance with respiratory medication;Demonstrates proper use of MDI's       Oxygen Re-Evaluation:   Oxygen Discharge (Final Oxygen Re-Evaluation):   Initial Exercise  Prescription: Initial Exercise Prescription - 03/02/18 1200      Date of Initial Exercise RX and Referring Provider   Date  03/02/18    Referring Provider  Ashby Dawes      Oxygen   Oxygen  Continuous    Liters  2      Treadmill   MPH  1    Grade  0.5    Minutes  15      NuStep   Level  1    SPM  80    Minutes  15    METs  2  Biostep-RELP   Level  2    SPM  50    Minutes  15    METs  2      Prescription Details   Frequency (times per week)  3    Duration  Progress to 45 minutes of aerobic exercise without signs/symptoms of physical distress      Intensity   THRR 40-80% of Max Heartrate  120-138    Ratings of Perceived Exertion  11-15    Perceived Dyspnea  0-4      Resistance Training   Training Prescription  Yes    Weight  3 lb    Reps  10-15       Perform Capillary Blood Glucose checks as needed.  Exercise Prescription Changes:   Exercise Comments:   Exercise Goals and Review: Exercise Goals    Row Name 03/02/18 1255             Exercise Goals   Increase Physical Activity  Yes       Intervention  Provide advice, education, support and counseling about physical activity/exercise needs.;Develop an individualized exercise prescription for aerobic and resistive training based on initial evaluation findings, risk stratification, comorbidities and participant's personal goals.       Expected Outcomes  Short Term: Attend rehab on a regular basis to increase amount of physical activity.;Long Term: Add in home exercise to make exercise part of routine and to increase amount of physical activity.;Long Term: Exercising regularly at least 3-5 days a week.       Increase Strength and Stamina  Yes       Intervention  Provide advice, education, support and counseling about physical activity/exercise needs.;Develop an individualized exercise prescription for aerobic and resistive training based on initial evaluation findings, risk stratification, comorbidities  and participant's personal goals.       Expected Outcomes  Short Term: Increase workloads from initial exercise prescription for resistance, speed, and METs.;Short Term: Perform resistance training exercises routinely during rehab and add in resistance training at home;Long Term: Improve cardiorespiratory fitness, muscular endurance and strength as measured by increased METs and functional capacity (6MWT)       Able to understand and use rate of perceived exertion (RPE) scale  Yes       Intervention  Provide education and explanation on how to use RPE scale       Expected Outcomes  Short Term: Able to use RPE daily in rehab to express subjective intensity level;Long Term:  Able to use RPE to guide intensity level when exercising independently       Able to understand and use Dyspnea scale  Yes       Intervention  Provide education and explanation on how to use Dyspnea scale       Expected Outcomes  Short Term: Able to use Dyspnea scale daily in rehab to express subjective sense of shortness of breath during exertion;Long Term: Able to use Dyspnea scale to guide intensity level when exercising independently       Knowledge and understanding of Target Heart Rate Range (THRR)  Yes       Intervention  Provide education and explanation of THRR including how the numbers were predicted and where they are located for reference       Expected Outcomes  Short Term: Able to state/look up THRR;Long Term: Able to use THRR to govern intensity when exercising independently;Short Term: Able to use daily as guideline for intensity in rehab  Able to check pulse independently  Yes       Intervention  Provide education and demonstration on how to check pulse in carotid and radial arteries.;Review the importance of being able to check your own pulse for safety during independent exercise       Expected Outcomes  Short Term: Able to explain why pulse checking is important during independent exercise;Long Term: Able to  check pulse independently and accurately       Understanding of Exercise Prescription  Yes       Intervention  Provide education, explanation, and written materials on patient's individual exercise prescription       Expected Outcomes  Short Term: Able to explain program exercise prescription;Long Term: Able to explain home exercise prescription to exercise independently          Exercise Goals Re-Evaluation :   Discharge Exercise Prescription (Final Exercise Prescription Changes):   Nutrition:  Target Goals: Understanding of nutrition guidelines, daily intake of sodium <1543m, cholesterol <2021m calories 30% from fat and 7% or less from saturated fats, daily to have 5 or more servings of fruits and vegetables.  Biometrics: Pre Biometrics - 03/02/18 1254      Pre Biometrics   Height  5' 2.5" (1.588 m)    Weight  115 lb 6.4 oz (52.3 kg)    Waist Circumference  31.5 inches    Hip Circumference  34.5 inches    Waist to Hip Ratio  0.91 %    BMI (Calculated)  20.76        Nutrition Therapy Plan and Nutrition Goals: Nutrition Therapy & Goals - 03/02/18 1152      Intervention Plan   Intervention  Prescribe, educate and counsel regarding individualized specific dietary modifications aiming towards targeted core components such as weight, hypertension, lipid management, diabetes, heart failure and other comorbidities.    Expected Outcomes  Short Term Goal: Understand basic principles of dietary content, such as calories, fat, sodium, cholesterol and nutrients.;Short Term Goal: A plan has been developed with personal nutrition goals set during dietitian appointment.;Long Term Goal: Adherence to prescribed nutrition plan.       Nutrition Assessments: Nutrition Assessments - 03/02/18 1142      MEDFICTS Scores   Pre Score  34       Nutrition Goals Re-Evaluation:   Nutrition Goals Discharge (Final Nutrition Goals Re-Evaluation):   Psychosocial: Target Goals: Acknowledge  presence or absence of significant depression and/or stress, maximize coping skills, provide positive support system. Participant is able to verbalize types and ability to use techniques and skills needed for reducing stress and depression.   Initial Review & Psychosocial Screening: Initial Psych Review & Screening - 03/02/18 1154      Initial Review   Current issues with  Current Depression;Current Anxiety/Panic;Current Psychotropic Meds;Current Sleep Concerns;Current Stress Concerns    Source of Stress Concerns  Chronic Illness;Unable to perform yard/household activities;Unable to participate in former interests or hobbies    Comments  Wants to be able to move and do what she wants without her shortness of breath      [place] Lives with Daughter As[place]aryl [place] family support too      Barriers   Psychosocial barriers to participate in program  There are no identifiable barriers or psychosocial needs.;The patient should benefit from training in stress management and relaxation.      Screening Interventions   Interventions  Encouraged to exercise;Program counselor consult;To provide support and  resources with identified psychosocial needs;Provide feedback about the scores to participant    Expected Outcomes  Short Term goal: Utilizing psychosocial counselor, staff and physician to assist with identification of specific Stressors or current issues interfering with healing process. Setting desired goal for each stressor or current issue identified.;Long Term Goal: Stressors or current issues are controlled or eliminated.;Short Term goal: Identification and review with participant of any Quality of Life or Depression concerns found by scoring the questionnaire.;Long Term goal: The participant improves quality of Life and PHQ9 Scores as seen by post scores and/or verbalization of changes       Quality of Life Scores:  Scores of 19 and below usually indicate a  poorer quality of life in these areas.  A difference of  2-3 points is a clinically meaningful difference.  A difference of 2-3 points in the total score of the Quality of Life Index has been associated with significant improvement in overall quality of life, self-image, physical symptoms, and general health in studies assessing change in quality of life.  PHQ-9: Recent Review Flowsheet Data    Depression screen [facility] 2/9 03/02/2018   Decreased Interest 3   Down, Depressed, Hopeless 3   PHQ - 2 Score 6   Altered sleeping 3   Tired, decreased energy 3   Change in appetite 3   Feeling bad or failure about yourself  1   Trouble concentrating 1   Moving slowly or fidgety/restless 1   Suicidal thoughts 0   PHQ-9 Score 18   Difficult doing work/chores Extremely dIfficult     Interpretation of Total Score  Total Score Depression Severity:  1-4 = Minimal depression, 5-9 = Mild depression, 10-14 = Moderate depression, 15-19 = Moderately severe depression, 20-27 = Severe depression   Psychosocial Evaluation and Intervention:   Psychosocial Re-Evaluation:   Psychosocial Discharge (Final Psychosocial Re-Evaluation):   Education: Education Goals: Education classes will be provided on a weekly basis, covering required topics. Participant will state understanding/return demonstration of topics presented.  Learning Barriers/Preferences: Learning Barriers/Preferences - 03/02/18 1127      Learning Barriers/Preferences   Learning Barriers  Hearing bad hearing out of left ear    Learning Preferences  None;Verbal Instruction       Education Topics:  Initial Evaluation Education: - Verbal, written and demonstration of respiratory meds, oximetry and breathing techniques. Instruction on use of nebulizers and MDIs and importance of monitoring MDI activations.   Pulmonary Rehab from 03/02/2018 in [facility] Cardiac and Pulmonary Rehab  Date  03/02/18  Educator  [facility]  Instruction Review Code  1- Verbalizes  Understanding      General Nutrition Guidelines/Fats and Fiber: -Group instruction provided by verbal, written material, models and posters to present the general guidelines for heart healthy nutrition. Gives an explanation and review of dietary fats and fiber.   Controlling Sodium/Reading Food Labels: -Group verbal and written material supporting the discussion of sodium use in heart healthy nutrition. Review and explanation with models, verbal and written materials for utilization of the food label.   Exercise Physiology & General Exercise Guidelines: - Group verbal and written instruction with models to review the exercise physiology of the cardiovascular system and associated critical values. Provides general exercise guidelines with specific guidelines to those with heart or lung disease.    Aerobic Exercise & Resistance Training: - Gives group verbal and written instruction on the various components of exercise. Focuses on aerobic and resistive training programs and the benefits of this training and how  to safely progress through these programs.   Flexibility, Balance, Mind/Body Relaxation: Provides group verbal/written instruction on the benefits of flexibility and balance training, including mind/body exercise modes such as yoga, pilates and tai chi.  Demonstration and skill practice provided.   Stress and Anxiety: - Provides group verbal and written instruction about the health risks of elevated stress and causes of high stress.  Discuss the correlation between heart/lung disease and anxiety and treatment options. Review healthy ways to manage with stress and anxiety.   Depression: - Provides group verbal and written instruction on the correlation between heart/lung disease and depressed mood, treatment options, and the stigmas associated with seeking treatment.   Exercise & Equipment Safety: - Individual verbal instruction and demonstration of equipment use and safety with  use of the equipment.   Pulmonary Rehab from 03/02/2018 in [facility] Cardiac and Pulmonary Rehab  Date  03/02/18  Educator  [facility]  Instruction Review Code  1- Verbalizes Understanding      Infection Prevention: - Provides verbal and written material to individual with discussion of infection control including proper hand washing and proper equipment cleaning during exercise session.   Pulmonary Rehab from 03/02/2018 in [facility] Cardiac and Pulmonary Rehab  Date  03/02/18  Educator  [facility]  Instruction Review Code  1- Verbalizes Understanding      Falls Prevention: - Provides verbal and written material to individual with discussion of falls prevention and safety.   Pulmonary Rehab from 03/02/2018 in [facility] Cardiac and Pulmonary Rehab  Date  03/02/18  Educator  [facility]  Instruction Review Code  1- Verbalizes Understanding      Diabetes: - Individual verbal and written instruction to review signs/symptoms of diabetes, desired ranges of glucose level fasting, after meals and with exercise. Advice that pre and post exercise glucose checks will be done for 3 sessions at entry of program.   Pulmonary Rehab from 03/02/2018 in [facility] Cardiac and Pulmonary Rehab  Date  03/02/18  Educator  [facility]  Instruction Review Code  1- Verbalizes Understanding      Chronic Lung Diseases: - Group verbal and written instruction to review updates, respiratory medications, advancements in procedures and treatments. Discuss use of supplemental oxygen including available portable oxygen systems, continuous and intermittent flow rates, concentrators, personal use and safety guidelines. Review proper use of inhaler and spacers. Provide informative websites for self-education.    Energy Conservation: - Provide group verbal and written instruction for methods to conserve energy, plan and organize activities. Instruct on pacing techniques, use of adaptive equipment and posture/positioning to relieve shortness of breath.   Triggers and  Exacerbations: - Group verbal and written instruction to review types of environmental triggers and ways to prevent exacerbations. Discuss weather changes, air quality and the benefits of nasal washing. Review warning signs and symptoms to help prevent infections. Discuss techniques for effective airway clearance, coughing, and vibrations.   AED/CPR: - Group verbal and written instruction with the use of models to demonstrate the basic use of the AED with the basic ABC's of resuscitation.   Anatomy and Physiology of the Lungs: - Group verbal and written instruction with the use of models to provide basic lung anatomy and physiology related to function, structure and complications of lung disease.   Anatomy & Physiology of the Heart: - Group verbal and written instruction and models provide basic cardiac anatomy and physiology, with the coronary electrical and arterial systems. Review of Valvular disease and Heart Failure   Cardiac Medications: - Group verbal and  written instruction to review commonly prescribed medications for heart disease. Reviews the medication, class of the drug, and side effects.   Know Your Numbers and Risk Factors: -Group verbal and written instruction about important numbers in your health.  Discussion of what are risk factors and how they play a role in the disease process.  Review of Cholesterol, Blood Pressure, Diabetes, and BMI and the role they play in your overall health.   Sleep Hygiene: -Provides group verbal and written instruction about how sleep can affect your health.  Define sleep hygiene, discuss sleep cycles and impact of sleep habits. Review good sleep hygiene tips.    Other: -Provides group and verbal instruction on various topics (see comments)    Knowledge Questionnaire Score: Knowledge Questionnaire Score - 03/02/18 1130      Knowledge Questionnaire Score   Pre Score  16/18 reviewed with patient        Core Components/Risk  Factors/Patient Goals at Admission: Personal Goals and Risk Factors at Admission - 03/02/18 1153      Core Components/Risk Factors/Patient Goals on Admission    Weight Management  Yes;Weight Maintenance    Intervention  Weight Management: Develop a combined nutrition and exercise program designed to reach desired caloric intake, while maintaining appropriate intake of nutrient and fiber, sodium and fats, and appropriate energy expenditure required for the weight goal.;Weight Management: Provide education and appropriate resources to help participant work on and attain dietary goals.;Weight Management/Obesity: Establish reasonable short term and long term weight goals.    Admit Weight  115 lb 6.4 oz (52.3 kg)    Goal Weight: Short Term  115 lb (52.2 kg)    Goal Weight: Long Term  115 lb (52.2 kg)    Expected Outcomes  Short Term: Continue to assess and modify interventions until short term weight is achieved;Weight Maintenance: Understanding of the daily nutrition guidelines, which includes 25-35% calories from fat, 7% or less cal from saturated fats, less than 267m cholesterol, less than 1.5gm of sodium, & 5 or more servings of fruits and vegetables daily;Understanding recommendations for meals to include 15-35% energy as protein, 25-35% energy from fat, 35-60% energy from carbohydrates, less than 2023mof dietary cholesterol, 20-35 gm of total fiber daily;Understanding of distribution of calorie intake throughout the day with the consumption of 4-5 meals/snacks    Tobacco Cessation  Yes    Number of packs per day  0 Quit Sept 2018  History pack a day 50 years    Expected Outcomes  Short Term: Will quit all tobacco product use, adhering to prevention of relapse plan.;Long Term: Complete abstinence from all tobacco products for at least 12 months from quit date.    Improve shortness of breath with ADL's  Yes    Intervention  Provide education, individualized exercise plan and daily activity instruction  to help decrease symptoms of SOB with activities of daily living.    Expected Outcomes  Short Term: Improve cardiorespiratory fitness to achieve a reduction of symptoms when performing ADLs;Long Term: Be able to perform more ADLs without symptoms or delay the onset of symptoms    Diabetes  Yes    Intervention  Provide education about signs/symptoms and action to take for hypo/hyperglycemia.;Provide education about proper nutrition, including hydration, and aerobic/resistive exercise prescription along with prescribed medications to achieve blood glucose in normal ranges: Fasting glucose 65-99 mg/dL    Expected Outcomes  Short Term: Participant verbalizes understanding of the signs/symptoms and immediate care of hyper/hypoglycemia, proper foot care and  importance of medication, aerobic/resistive exercise and nutrition plan for blood glucose control.;Long Term: Attainment of HbA1C < 7%.    Lipids  Yes    Intervention  Provide education and support for participant on nutrition & aerobic/resistive exercise along with prescribed medications to achieve LDL <29m, HDL >429m    Expected Outcomes  Short Term: Participant states understanding of desired cholesterol values and is compliant with medications prescribed. Participant is following exercise prescription and nutrition guidelines.;Long Term: Cholesterol controlled with medications as prescribed, with individualized exercise RX and with personalized nutrition plan. Value goals: LDL < 7020mHDL > 40 mg.    Stress  Yes Stress dealing with inability to move as fast as she wants,being limited by shortness of breath    Intervention  Offer individual and/or small group education and counseling on adjustment to heart disease, stress management and health-related lifestyle change. Teach and support self-help strategies.;Refer participants experiencing significant psychosocial distress to appropriate mental health specialists for further evaluation and treatment. When  possible, include family members and significant others in education/counseling sessions.    Expected Outcomes  Short Term: Participant demonstrates changes in health-related behavior, relaxation and other stress management skills, ability to obtain effective social support, and compliance with psychotropic medications if prescribed.;Long Term: Emotional wellbeing is indicated by absence of clinically significant psychosocial distress or social isolation.       Core Components/Risk Factors/Patient Goals Review:    Core Components/Risk Factors/Patient Goals at Discharge (Final Review):    ITP Comments: ITP Comments    Row Name 03/02/18 1123 03/08/18 0817         ITP Comments  Medical Evaluation completed. Chart sent for review and changes to Dr. [place]ily [place]ctor [place] be found in [facility] encounter 12/30/17   30 day review completed. ITP sent to Dr. MarEmily Filbertrector of [place] with ITP unless changes are made by physician         Comments: 30 day review

## 2018-03-10 DIAGNOSIS — J449 Chronic obstructive pulmonary disease, unspecified: Secondary | ICD-10-CM | POA: Diagnosis not present

## 2018-03-10 LAB — GLUCOSE, CAPILLARY
GLUCOSE-CAPILLARY: 113 mg/dL — AB (ref 65–99)
Glucose-Capillary: 130 mg/dL — ABNORMAL HIGH (ref 65–99)

## 2018-03-10 NOTE — Progress Notes (Signed)
Daily Session Note  Patient Details  Name: Amanda Porter MRN: [id number] Date of Birth: [birth year] Referring Provider:     Pulmonary Rehab from 03/02/2018 in [facility] Cardiac and Pulmonary Rehab  Referring Provider  Ramachandran      Encounter Date: 03/10/2018  Check In: Session Check In - 03/10/18 1025      Check-In   Location  [facility]-Cardiac & Pulmonary Rehab    Staff Present  Alberteen Sam, MA, RCEP, CCRP, Exercise Physiologist;Josiephine Simao Oletta Darter, [place], ACSM CEP, Exercise Physiologist;Joseph Flavia Shipper    Supervising physician immediately available to respond to emergencies  [facility] immediately available ER MD    Physician(s)  Jimmye Norman and Clearnce Hasten    Medication changes reported      No    Fall or balance concerns reported     No    Warm-up and Cool-down  Performed as group-led Higher education careers adviser Performed  Yes    VAD Patient?  No      Pain Assessment   Currently in Pain?  No/denies    Multiple Pain Sites  No          Social History   Tobacco Use  Smoking Status Former Smoker  . Packs/day: 0.50  . Years: 50.00  . Pack years: 25.00  . Last attempt to quit: 08/01/2017  . Years since quitting: 0.6  Smokeless Tobacco Never Used    Goals Met:  Proper associated with RPD/PD & O2 Sat Exercise tolerated well Personal goals reviewed Strength training completed today  Goals Unmet:  Not Applicable  Comments: First full day of exercise!  Patient was oriented to gym and equipment including functions, settings, policies, and procedures.  Patient's individual exercise prescription and treatment plan were reviewed.  All starting workloads were established based on the results of the 6 minute walk test done at initial orientation visit.  The plan for exercise progression was also introduced and progression will be customized based on patient's performance and goals.    Dr. Emily Filbert is Medical Director for [place] and [facility]  Pulmonary Rehabilitation.

## 2018-03-15 ENCOUNTER — Telehealth: Payer: Self-pay

## 2018-03-15 NOTE — Telephone Encounter (Signed)
Amanda Porter called out of [facility] today due to being sick.

## 2018-03-17 ENCOUNTER — Other Ambulatory Visit: Payer: Self-pay | Admitting: Primary Care

## 2018-03-17 DIAGNOSIS — G4700 Insomnia, unspecified: Secondary | ICD-10-CM

## 2018-03-17 DIAGNOSIS — J449 Chronic obstructive pulmonary disease, unspecified: Secondary | ICD-10-CM

## 2018-03-17 LAB — GLUCOSE, CAPILLARY
GLUCOSE-CAPILLARY: 117 mg/dL — AB (ref 65–99)
Glucose-Capillary: 107 mg/dL — ABNORMAL HIGH (ref 65–99)

## 2018-03-17 NOTE — Telephone Encounter (Signed)
Ok to refill? Electronically refill request for traZODone (DESYREL) 50 MG tablet  Last prescribed on 02/18/2018 and last seen on 02/19/2018

## 2018-03-17 NOTE — Progress Notes (Signed)
Daily Session Note  Patient Details  Name: Amanda Porter MRN: [id number] Date of Birth: Mar 26, 1946 Referring Provider:     Pulmonary Rehab from 03/02/2018 in [facility] Cardiac and Pulmonary Rehab  Referring Provider  Ramachandran      Encounter Date: 03/17/2018  Check In: Session Check In - 03/17/18 0959      Check-In   Location  [facility]-Cardiac & Pulmonary Rehab    Staff Present  Justin Mend RCP,RRT,BSRT;Amanda Oletta Darter, BA, ACSM CEP, Exercise Physiologist;Jessica Luan Pull[place], MA, RCEP, CCRP, Exercise Physiologist    Supervising physician immediately available to respond to emergencies  [facility] immediately available ER MD    Physician(s)  Dr. Owens Shark and Jimmye Norman    Medication changes reported      No    Fall or balance concerns reported     No    Tobacco Cessation  No Change    Warm-up and Cool-down  Performed as group-led instruction    Resistance Training Performed  Yes    VAD Patient?  No      Pain Assessment   Currently in Pain?  No/denies          Social History   Tobacco Use  Smoking Status Former Smoker  . Packs/day: 0.50  . Years: 50.00  . Pack years: 25.00  . Last attempt to quit: 08/01/2017  . Years since quitting: 0.6  Smokeless Tobacco Never Used    Goals Met:  Independence with exercise equipment Exercise tolerated well No report of cardiac concerns or symptoms Strength training completed today  Goals Unmet:  Not Applicable  Comments: Pt able to follow exercise prescription today without complaint.  Will continue to monitor for progression.   Dr. Emily Filbert is Medical Director for [place] and [facility] Pulmonary Rehabilitation.

## 2018-03-18 ENCOUNTER — Encounter (INDEPENDENT_AMBULATORY_CARE_PROVIDER_SITE_OTHER): Payer: Self-pay

## 2018-03-18 NOTE — Telephone Encounter (Signed)
Will send patient my chart message to see how she's doing on Trazodone.

## 2018-03-19 ENCOUNTER — Telehealth: Payer: Self-pay | Admitting: *Deleted

## 2018-03-19 ENCOUNTER — Ambulatory Visit: Payer: Self-pay | Admitting: *Deleted

## 2018-03-19 NOTE — Telephone Encounter (Signed)
Amanda Porter called in to let us know that she would be out today due to the weather.

## 2018-03-20 ENCOUNTER — Other Ambulatory Visit: Payer: Self-pay | Admitting: Primary Care

## 2018-03-20 DIAGNOSIS — E119 Type 2 diabetes mellitus without complications: Secondary | ICD-10-CM

## 2018-03-22 DIAGNOSIS — J449 Chronic obstructive pulmonary disease, unspecified: Secondary | ICD-10-CM | POA: Diagnosis not present

## 2018-03-22 LAB — GLUCOSE, CAPILLARY
GLUCOSE-CAPILLARY: 185 mg/dL — AB (ref 65–99)
GLUCOSE-CAPILLARY: 139 mg/dL — AB (ref 65–99)

## 2018-03-22 NOTE — Progress Notes (Signed)
Daily Session Note  Patient Details  Name: Amanda Porter MRN: [id number] Date of Birth: 24-Nov-1946 Referring Provider:     Pulmonary Rehab from 03/02/2018 in [facility] Cardiac and Pulmonary Rehab  Referring Provider  Ramachandran      Encounter Date: 03/22/2018  Check In: Session Check In - 03/22/18 1010      Check-In   Location  [facility]-Cardiac & Pulmonary Rehab    Staff Present  Earlean Shawl, BS, ACSM CEP, Exercise Physiologist;Mandi [place], BS, PEC;Mylea Roarty [employer] physician immediately available to respond to emergencies  [facility] immediately available ER MD    Physician(s)  Dr. Reita Cliche and Cinda Quest    Medication changes reported      No    Fall or balance concerns reported     No    Tobacco Cessation  No Change    Warm-up and Cool-down  Performed as group-led instruction    Resistance Training Performed  Yes    VAD Patient?  No      Pain Assessment   Currently in Pain?  No/denies          Social History   Tobacco Use  Smoking Status Former Smoker  . Packs/day: 0.50  . Years: 50.00  . Pack years: 25.00  . Last attempt to quit: 08/01/2017  . Years since quitting: 0.6  Smokeless Tobacco Never Used    Goals Met:  Independence with exercise equipment Exercise tolerated well No report of cardiac concerns or symptoms Strength training completed today  Goals Unmet:  Not Applicable  Comments: Pt able to follow exercise prescription today without complaint.  Will continue to monitor for progression.   Dr. Emily Filbert is Medical Director for [place] and [facility] Pulmonary Rehabilitation.

## 2018-03-23 ENCOUNTER — Other Ambulatory Visit: Payer: Self-pay | Admitting: Primary Care

## 2018-03-23 DIAGNOSIS — J449 Chronic obstructive pulmonary disease, unspecified: Secondary | ICD-10-CM

## 2018-03-23 MED ORDER — ALBUTEROL SULFATE HFA 108 (90 BASE) MCG/ACT IN AERS: INHALATION_SPRAY | RESPIRATORY_TRACT | 2 refills | Status: DC

## 2018-03-24 ENCOUNTER — Encounter: Payer: Self-pay | Admitting: *Deleted

## 2018-03-24 ENCOUNTER — Emergency Department: Payer: Medicare Other

## 2018-03-24 ENCOUNTER — Other Ambulatory Visit: Payer: Self-pay

## 2018-03-24 ENCOUNTER — Emergency Department
Admission: EM | Admit: 2018-03-24 | Discharge: 2018-03-24 | Disposition: A | Discharge status: Home / Self Care | Payer: Medicare Other | Attending: Emergency Medicine | Admitting: Emergency Medicine

## 2018-03-24 ENCOUNTER — Telehealth: Payer: Self-pay | Admitting: *Deleted

## 2018-03-24 DIAGNOSIS — E119 Type 2 diabetes mellitus without complications: Secondary | ICD-10-CM | POA: Insufficient documentation

## 2018-03-24 DIAGNOSIS — I10 Essential (primary) hypertension: Secondary | ICD-10-CM | POA: Diagnosis not present

## 2018-03-24 DIAGNOSIS — Z85118 Personal history of other malignant neoplasm of bronchus and lung: Secondary | ICD-10-CM | POA: Insufficient documentation

## 2018-03-24 DIAGNOSIS — Z7984 Long term (current) use of oral hypoglycemic drugs: Secondary | ICD-10-CM | POA: Diagnosis not present

## 2018-03-24 DIAGNOSIS — F0390 Unspecified dementia without behavioral disturbance: Secondary | ICD-10-CM | POA: Diagnosis not present

## 2018-03-24 DIAGNOSIS — J441 Chronic obstructive pulmonary disease with (acute) exacerbation: Secondary | ICD-10-CM | POA: Insufficient documentation

## 2018-03-24 DIAGNOSIS — R0602 Shortness of breath: Secondary | ICD-10-CM

## 2018-03-24 DIAGNOSIS — Z902 Acquired absence of lung [part of]: Secondary | ICD-10-CM | POA: Diagnosis not present

## 2018-03-24 DIAGNOSIS — E860 Dehydration: Secondary | ICD-10-CM | POA: Diagnosis not present

## 2018-03-24 DIAGNOSIS — Z7902 Long term (current) use of antithrombotics/antiplatelets: Secondary | ICD-10-CM | POA: Diagnosis not present

## 2018-03-24 DIAGNOSIS — Z79899 Other long term (current) drug therapy: Secondary | ICD-10-CM | POA: Insufficient documentation

## 2018-03-24 DIAGNOSIS — J449 Chronic obstructive pulmonary disease, unspecified: Secondary | ICD-10-CM

## 2018-03-24 DIAGNOSIS — Z87891 Personal history of nicotine dependence: Secondary | ICD-10-CM | POA: Diagnosis not present

## 2018-03-24 LAB — CBC
HCT: 28.9 % — ABNORMAL LOW (ref 35.0–47.0)
HEMOGLOBIN: 9.8 g/dL — AB (ref 12.0–16.0)
MCH: 29.9 pg (ref 26.0–34.0)
MCHC: 33.8 g/dL (ref 32.0–36.0)
MCV: 88.4 fL (ref 80.0–100.0)
Platelets: 392 10*3/uL (ref 150–440)
RBC: 3.27 MIL/uL — AB (ref 3.80–5.20)
RDW: 14.3 % (ref 11.5–14.5)
WBC: 9.6 10*3/uL (ref 3.6–11.0)

## 2018-03-24 LAB — BASIC METABOLIC PANEL
ANION GAP: 8 (ref 5–15)
BUN: 21 mg/dL — ABNORMAL HIGH (ref 6–20)
CHLORIDE: 104 mmol/L (ref 101–111)
CO2: 27 mmol/L (ref 22–32)
Calcium: 9.3 mg/dL (ref 8.9–10.3)
Creatinine, Ser: 0.77 mg/dL (ref 0.44–1.00)
GFR calc non Af Amer: >60 mL/min (ref >60)
Glucose, Bld: 150 mg/dL — ABNORMAL HIGH (ref 65–99)
Potassium: 4 mmol/L (ref 3.5–5.1)
Sodium: 139 mmol/L (ref 135–145)

## 2018-03-24 MED ORDER — AMOXICILLIN-POT CLAVULANATE 875-125 MG PO TABS: 1.0000 | ORAL_TABLET | Freq: Two times a day (BID) | ORAL | 0 refills | Status: DC

## 2018-03-24 MED ORDER — PREDNISONE 20 MG PO TABS: 40.0000 mg | ORAL_TABLET | Freq: Every day | ORAL | 0 refills | Status: DC

## 2018-03-24 MED ORDER — PREDNISONE 20 MG PO TABS
40.0000 mg | ORAL_TABLET | ORAL | Status: AC
  Administered 2018-03-24: 40 mg via ORAL
  Filled 2018-03-24: qty 2

## 2018-03-24 MED ORDER — SODIUM CHLORIDE 0.9 % IV BOLUS
500.0000 mL | Freq: Once | INTRAVENOUS | Status: AC
  Administered 2018-03-24: 500 mL via INTRAVENOUS

## 2018-03-24 NOTE — ED Triage Notes (Signed)
Pt c/o increased SOB with generalized weakness for the past week. Pt is on continuous O2 at home 2L [place]. Pt is a/ox4 on arrival , in NAD on arrival..

## 2018-03-24 NOTE — Telephone Encounter (Signed)
Rieley's daughter called to let us know that her mom would be out today as they were on their way to the emergency room.

## 2018-03-24 NOTE — ED Notes (Signed)
Patient states she is unable to void at this  Time.

## 2018-03-24 NOTE — ED Notes (Signed)
Pt states that she has COPD and for the last week she has become increasingly weak - c/o "dizzy spells" but denies any falls - reports shortness of breath with minor activity such as walking across the room - at this time on 2L O2 and appears in no acute distress - able to speak in complete sentences and denies pain

## 2018-03-24 NOTE — ED Provider Notes (Signed)
[facility] Emergency Department Provider Note  ____________________________________________  Time seen: Approximately 11:22 AM  I have reviewed the triage vital signs and the nursing notes.   HISTORY  Chief Complaint Shortness of Breath and Weakness    HPI Amanda Porter is a 72 y.o. female with a history of COPD, dementia, who complains of feeling generalized weakness and lightheaded on standing over the past week. Over the past 3 days she's also had gradually worsening shortness of breath that feels like her usual COPD. Denies any chest pain. Denies cough fevers chills or sweats. Shortness of breath is constant, worse with walking, better with rest. Mild to moderate severity. Denies recent travel trauma hospitalization or surgery. she used her nebulizer at home as well as an inhaled corticosteroid, and called her outpatient doctors to be seen today but since they couldn't work her in, she came to the ED for evaluation.     Past Medical History:  Diagnosis Date  . Anxiety   . Anxiety and depression   . COPD (chronic obstructive pulmonary disease) ([place])   . CVA (cerebral vascular accident) ([place])   . Dementia   . Dysphasia   . History of lung cancer   . Hyperlipidemia   . Memory disturbance 11/20/2017  . Normal pressure hydrocephalus 2018  . Retinal detachment    Right  . Right carotid bruit 11/20/2017  . Thrombosis    Arterial to lower extremity?     Patient Active Problem List   Diagnosis Date Noted  . Type 2 diabetes mellitus without complication, without long-term current use of insulin ([place]) 02/18/2018  . Pain of left lower extremity 11/26/2017  . Memory disturbance 11/20/2017  . Right carotid bruit 11/20/2017  . Hyperlipidemia 09/14/2017  . Chronic obstructive pulmonary disease ([place]) 09/14/2017  . Dementia without behavioral disturbance 09/14/2017  . Normal pressure hydrocephalus 09/14/2017  . Dysphagia 09/14/2017  . Essential  hypertension 09/14/2017  . CVA (cerebral vascular accident) ([place]) 09/14/2017  . Anxiety and depression 09/14/2017  . Tobacco use disorder 12/10/2016     Past Surgical History:  Procedure Laterality Date  . LUNG REMOVAL, PARTIAL     left upper lobe  . VENTRICULOPERITONEAL SHUNT  12/09/2016     Prior to Admission medications   Medication Sig Start Date End Date Taking? Authorizing Provider  albuterol (PROVENTIL HFA;VENTOLIN HFA) 108 (90 Base) MCG/ACT inhaler TAKE 2 PUFFS BY MOUTH EVERY 4 HOURS AS NEEDED 03/23/18   Pleas Koch, NP  amLODipine (NORVASC) 5 MG tablet Take 5 mg by mouth daily.  10/14/16   [provider]  amoxicillin-clavulanate (AUGMENTIN) 875-125 MG tablet Take 1 tablet by mouth 2 (two) times daily. 03/24/18   Carrie Mew, MD  busPIRone (BUSPAR) 7.5 MG tablet Take 1 tablet by mouth twice daily for anxiety. 02/23/18   Pleas Koch, NP  clopidogrel (PLAVIX) 75 MG tablet TAKE 1 TABLET BY MOUTH EVERY DAY 12/08/17   Pleas Koch, NP  Fluticasone-Umeclidin-Vilant (TRELEGY ELLIPTA) 100-62.5-25 MCG/INH AEPB Inhale 1 Inhaler into the lungs daily. 09/30/17   Laverle Hobby, MD  glipiZIDE (GLIPIZIDE XL) 5 MG 24 hr tablet Take 1 tablet by mouth once daily with breakfast for diabetes. 02/19/18   Pleas Koch, NP  hydrOXYzine (ATARAX/VISTARIL) 10 MG tablet Take 1 tablet (10 mg total) by mouth 2 (two) times daily as needed for anxiety. Patient not taking: Reported on 02/18/2018 12/17/17   Pleas Koch, NP  ipratropium-albuterol (DUONEB) 0.5-2.5 (3) MG/3ML SOLN Take 7mLs by nebulization  every 6 (six) hours and prn. Dx:J44.9 01/01/18   Laverle Hobby, MD  losartan (COZAAR) 100 MG tablet Take 1 tablet (100 mg total) by mouth daily. 12/03/17   Pleas Koch, NP  Melatonin 5 MG TABS Take 5 mg by mouth at bedtime.     [provider]  Multiple Vitamin (MULTIVITAMIN) capsule Take 1 capsule by mouth daily.    [provider]   Omega-3 Fatty Acids (FISH OIL PO) Take 520 mg by mouth.    [provider]  omeprazole (PRILOSEC) 20 MG capsule Take 1 capsule (20 mg total) by mouth daily. 12/17/17   Pleas Koch, NP  ONE TOUCH LANCETS MISC 1 each by Other route 2 (two) times daily. 11/27/17   Pleas Koch, NP  ONE TOUCH ULTRA TEST test strip USE 1 TWICE A DAY AS INSTRUCTED 02/08/18   Pleas Koch, NP  OXYGEN 2 L continuous.     [provider]  predniSONE (DELTASONE) 20 MG tablet Take 2 tablets (40 mg total) by mouth daily. 03/24/18   Carrie Mew, MD  rosuvastatin (CRESTOR) 5 MG tablet Take 1 tablet (5 mg total) by mouth every evening. 09/15/17   Pleas Koch, NP  traZODone (DESYREL) 50 MG tablet Take 1/2 to 1 tablet at bedtime for sleep. 02/18/18   Pleas Koch, NP     Allergies Lexapro [escitalopram oxalate] and Neosporin  [neomycin-polymyxin-gramicidin]   Family History  Problem Relation Age of Onset  . Pneumonia Mother   . Cancer Father     Social History Social History   Tobacco Use  . Smoking status: Former Smoker    Packs/day: 0.50    Years: 50.00    Pack years: 25.00    Last attempt to quit: 08/01/2017    Years since quitting: 0.6  . Smokeless tobacco: Never Used  Substance Use Topics  . Alcohol use: No  . Drug use: No    Review of Systems  Constitutional:   No fever or chills.  ENT:   No sore throat. No rhinorrhea. Cardiovascular:   No chest pain or syncope. Respiratory:   positive shortness of breath as above without cough. Gastrointestinal:   Negative for abdominal pain, vomiting and diarrhea.  Musculoskeletal:   Negative for focal pain or swelling All other systems reviewed and are negative except as documented above in ROS and HPI.  ____________________________________________   PHYSICAL EXAM:  VITAL SIGNS: ED Triage Vitals  Enc Vitals Group     BP 03/24/18 0933 (!) 101/50     Pulse Rate 03/24/18 0931 (!) 102     Resp 03/24/18  0931 18     Temp 03/24/18 0931 98.1 F (36.7 C)     Temp Source 03/24/18 0931 Oral     SpO2 03/24/18 0931 98 %     Weight 03/24/18 0933 115 lb (52.2 kg)     Height 03/24/18 0933 5\' 2"  (1.575 m)     Head Circumference --      Peak Flow --      Pain Score 03/24/18 0933 0     Pain Loc --      Pain Edu? --      Excl. in [place]? --     Vital signs reviewed, nursing assessments reviewed.   Constitutional:   Alert and oriented. Well appearing and in no distress. Eyes:   Conjunctivae are normal. EOMI. PERRL. ENT      Head:   Normocephalic and atraumatic.  Nose:   No congestion/rhinnorhea.       Mouth/Throat:   MMM, no pharyngeal erythema. No peritonsillar mass.       Neck:   No meningismus. Full ROM. Hematological/Lymphatic/Immunilogical:   No cervical lymphadenopathy. Cardiovascular:   RRR. Symmetric bilateral radial and DP pulses.  No murmurs.  Respiratory:   Normal respiratory effort without tachypnea/retractions. Breath sounds are clear and equal bilaterally. No wheezes/rales/rhonchi.FEV1 maneuver induces end-expiratory wheezing. Gastrointestinal:   Soft and nontender. Non distended. There is no CVA tenderness.  No rebound, rigidity, or guarding. Genitourinary:   deferred Musculoskeletal:   Normal range of motion in all extremities. No joint effusions.  No lower extremity tenderness.  No edema. Neurologic:   Normal speech and language.  Motor grossly intact. No acute focal neurologic deficits are appreciated.  Skin:    Skin is warm, dry and intact. No rash noted.  No petechiae, purpura, or bullae.  ____________________________________________    LABS (pertinent positives/negatives) (all labs ordered are listed, but only abnormal results are displayed) Labs Reviewed  BASIC METABOLIC PANEL - Abnormal; Notable for the following components:      Result Value   Glucose, Bld 150 (*)    BUN 21 (*)    All other components within normal limits  CBC - Abnormal; Notable for the  following components:   RBC 3.27 (*)    Hemoglobin 9.8 (*)    HCT 28.9 (*)    All other components within normal limits  URINALYSIS, COMPLETE (UACMP) WITH MICROSCOPIC  CBG MONITORING, ED   ____________________________________________   EKG  interpreted by me  Date: 03/24/2018  Rate: 90  Rhythm: normal sinus rhythm  QRS Axis: normal  Intervals: normal  ST/T Wave abnormalities: normal  Conduction Disutrbances: none  Narrative Interpretation: unremarkable      ____________________________________________    RADIOLOGY  Dg Chest 2 View  Result Date: 03/24/2018 CLINICAL DATA:  Shortness of breath and dizziness EXAM: CHEST - 2 VIEW COMPARISON:  January 25, 2018. FINDINGS: There is postoperative change on the left with stable scarring and volume loss on the left. There is no edema or consolidation. The heart size is normal. Pulmonary vascularity on the right is within normal limits. There is distortion of pulmonary vascularity on the left due to postoperative scarring. No adenopathy. There is aortic atherosclerosis. No bone lesions. IMPRESSION: Postoperative change on the left with scarring and volume loss. No edema or consolidation. Stable cardiac silhouette. There is aortic atherosclerosis. Aortic Atherosclerosis (ICD10-I70.0). Electronically Signed   By: Lowella Grip III M.D.   On: 03/24/2018 10:15    ____________________________________________   PROCEDURES Procedures  ____________________________________________    CLINICAL IMPRESSION / ASSESSMENT AND PLAN / ED COURSE  Pertinent labs & imaging results that were available during my care of the patient were reviewed by me and considered in my medical decision making (see chart for details).      Clinical Course as of Mar 24 1121  Wed Mar 24, 2018  1026 Well appearing, nad. Minimal wheeze on fev1 maneuver. Much improved. Prednisone, abx. Small ivf bolus for mild dehydration. Suitable for dc home and f/u with  pcp / pul m.   [PS]    Clinical Course User Index [PS] Carrie Mew, MD     ----------------------------------------- 11:27 AM on 03/24/2018 ----------------------------------------- Still well appearing and comfortable. Low suspicion of ACS PE dissection myocarditis, occult pneumonia or pneumothorax   ____________________________________________   FINAL CLINICAL IMPRESSION(S) / ED DIAGNOSES    Final diagnoses:  COPD exacerbation (  [place])  Shortness of breath  Dehydration     ED Discharge Orders        Ordered    amoxicillin-clavulanate (AUGMENTIN) 875-125 MG tablet  2 times daily     03/24/18 1122    predniSONE (DELTASONE) 20 MG tablet  Daily     03/24/18 1122      Portions of this note were generated with dragon dictation software. Dictation errors may occur despite best attempts at proofreading.    Carrie Mew, MD 03/24/18 1128

## 2018-03-26 ENCOUNTER — Encounter: Payer: Self-pay | Admitting: *Deleted

## 2018-03-26 ENCOUNTER — Encounter: Payer: Medicare Other | Admitting: *Deleted

## 2018-03-26 VITALS — BP 110/60 | Ht 62.0 in | Wt 116.8 lb

## 2018-03-26 DIAGNOSIS — J449 Chronic obstructive pulmonary disease, unspecified: Secondary | ICD-10-CM | POA: Diagnosis not present

## 2018-03-26 DIAGNOSIS — E119 Type 2 diabetes mellitus without complications: Secondary | ICD-10-CM

## 2018-03-26 NOTE — Patient Instructions (Addendum)
Check blood sugars 2 x day before breakfast and 2 hrs after one meal 3-4 x week Bring blood sugar records to the next appointment  Exercise: Continue pulmonary rehab  for 30 minutes 3 days a week  Eat 3 meals day, 1-2 snacks a day Space meals 4-6 hours apart Allow 2-3 hours between meals and snacks Limit desserts/sweets Continue to avoid sugar sweetened drinks (soda) Complete 3 Day Food Record and bring to next appt  Return for appointment on:  Friday Apr 30, 2018 at 2:00 pm with [facility] (dietitian)

## 2018-03-26 NOTE — Progress Notes (Signed)
Daily Session Note  Patient Details  Name: Amanda Porter MRN: [id number] Date of Birth: December 22, 1945 Referring Provider:     Pulmonary Rehab from 03/02/2018 in [facility] Cardiac and Pulmonary Rehab  Referring Provider  Ramachandran      Encounter Date: 03/26/2018  Check In: Session Check In - 03/26/18 1010      Check-In   Location  [facility]-Cardiac & Pulmonary Rehab    Staff Present  Renita Papa, RN Vickki Hearing, BA, ACSM CEP, Exercise Physiologist;Brandin Stetzer Flavia Shipper    Supervising physician immediately available to respond to emergencies  [facility] immediately available ER MD    Physician(s)  Dr. Jimmye Norman and Corky Downs    Medication changes reported      No    Fall or balance concerns reported     No    Tobacco Cessation  No Change    Warm-up and Cool-down  Performed as group-led instruction    Resistance Training Performed  Yes    VAD Patient?  No      Pain Assessment   Currently in Pain?  No/denies          Social History   Tobacco Use  Smoking Status Former Smoker  . Packs/day: 0.50  . Years: 50.00  . Pack years: 25.00  . Last attempt to quit: 08/01/2017  . Years since quitting: 0.6  Smokeless Tobacco Never Used    Goals Met:  Proper associated with RPD/PD & O2 Sat Independence with exercise equipment Using PLB without cueing & demonstrates good technique Exercise tolerated well No report of cardiac concerns or symptoms Strength training completed today  Goals Unmet:  Not Applicable  Comments: Pt able to follow exercise prescription today without complaint.  Will continue to monitor for progression.    Dr. Emily Filbert is Medical Director for [place] and [facility].

## 2018-03-26 NOTE — Progress Notes (Signed)
Diabetes Self-Management Education  Visit Type: First/Initial  Appt. Start Time: 1320 Appt. End Time: 1430  03/26/2018  Amanda Porter, identified by name and date of birth, is a 72 y.o. female with a diagnosis of Diabetes: Type 2.   ASSESSMENT  Blood pressure 110/60, height 5\' 2"  (1.575 m), weight 116 lb 12.8 oz (53 kg). Body mass index is 21.36 kg/m.  Diabetes Self-Management Education - 03/26/18 1456      Visit Information   Visit Type  First/Initial      Initial Visit   Diabetes Type  Type 2    Are you currently following a meal plan?  No    Are you taking your medications as prescribed?  Yes    Date Diagnosed  6 months      Health Coping   How would you rate your overall health?  Poor      Psychosocial Assessment   Patient Belief/Attitude about Diabetes  Other (comment) "depressed"    Self-care barriers  Debilitated state due to current medical condition    Self-management support  Doctor's office;Family    Other persons present  Family Member Daughter    Patient Concerns  Nutrition/Meal planning;Medication;Glycemic Control;Healthy Lifestyle    Special Needs  None    Preferred Learning Style  Auditory;Visual;Hands on;No preference indicated    Learning Readiness  Contemplating    How often do you need to have someone help you when you read instructions, pamphlets, or other written materials from your doctor or pharmacy?  4 - Often    What is the last grade level you completed in school?  Pt's daughter fills out paper work due to fatigue from pulmonary diagnosis - but patient is educated.       Pre-Education Assessment   Patient understands the diabetes disease and treatment process.  Needs Instruction    Patient understands incorporating nutritional management into lifestyle.  Needs Instruction    Patient undertands incorporating physical activity into lifestyle.  Needs Review    Patient understands using medications safely.  Needs Instruction    Patient  understands monitoring blood glucose, interpreting and using results  Needs Review    Patient understands prevention, detection, and treatment of acute complications.  Needs Instruction    Patient understands prevention, detection, and treatment of chronic complications.  Needs Instruction    Patient understands how to develop strategies to address psychosocial issues.  Needs Instruction    Patient understands how to develop strategies to promote health/change behavior.  Needs Instruction      Complications   Last HgB A1C per patient/outside source  7.5 % 02/18/18    How often do you check your blood sugar?  3-4 times / week Daughter requested blood sugar check during appointment as patient was feeling "shaky". BG was 282 mg/dL at 2:20 pm - 2 hrs pp. Pt is also taking steroids for recent COPD exacerbation.      Fasting Blood glucose range (mg/dL)  70-129 Pt reports fasting blood sugars 95-111 mg/dL.     Have you had a dilated eye exam in the past 12 months?  Yes    Have you had a dental exam in the past 12 months?  No dentures    Are you checking your feet?  Yes    How many days per week are you checking your feet?  7      Dietary Intake   Breakfast  cereal, sausage, toast, applesauce    Snack (morning)  4:00 am -  crackers    Lunch  fruit, bologna sandwich or peanut butter and jelly, left overs    Snack (afternoon)  peanut butter crackers    Dinner  chicken, beef, pork, occasional potatoes, peals, beans, corn, green beans, greens    Snack (evening)  chips, cookie    Beverage(s)  water, diet soda, recently stopped regular sodas      Exercise   Exercise Type  Light (walking / raking leaves)    How many days per week to you exercise?  3    How many minutes per day do you exercise?  30    Total minutes per week of exercise  90      Patient Education   Previous Diabetes Education  No    Disease state   Definition of diabetes, type 1 and 2, and the diagnosis of diabetes    Nutrition  management   Role of diet in the treatment of diabetes and the relationship between the three main macronutrients and blood glucose level;Reviewed blood glucose goals for pre and post meals and how to evaluate the patients' food intake on their blood glucose level.;Meal timing in regards to the patients' current diabetes medication.    Physical activity and exercise   Role of exercise on diabetes management, blood pressure control and cardiac health.    Medications  Reviewed patients medication for diabetes, action, purpose, timing of dose and side effects.    Monitoring  Purpose and frequency of SMBG.;Taught/discussed recording of test results and interpretation of SMBG.;Identified appropriate SMBG and/or A1C goals.    Chronic complications  Relationship between chronic complications and blood glucose control    Psychosocial adjustment  Role of stress on diabetes;Identified and addressed patients feelings and concerns about diabetes Pt in process of moving to bigger place with her daughter.       Individualized Goals (developed by patient)   Reducing Risk Improve blood sugars Decrease medications Lead a healthier lifestyle     Outcomes   Expected Outcomes  Demonstrated interest in learning. Expect positive outcomes    Future DMSE  4-6 wks       Individualized Plan for Diabetes Self-Management Training:   Learning Objective:  Patient will have a greater understanding of diabetes self-management. Patient education plan is to attend individual and/or group sessions per assessed needs and concerns.   Plan:   Patient Instructions  Check blood sugars 2 x day before breakfast and 2 hrs after one meal 3-4 x week Bring blood sugar records to the next appointment Exercise: Continue pulmonary rehab  for 30 minutes 3 days a week Eat 3 meals day, 1-2 snacks a day Space meals 4-6 hours apart Allow 2-3 hours between meals and snacks Limit desserts/sweets Continue to avoid sugar sweetened drinks  (soda) Complete 3 Day Food Record and bring to next appt Return for appointment on:  Friday Apr 30, 2018 at 2:00 pm with Pam (dietitian)   Expected Outcomes:  Demonstrated interest in learning. Expect positive outcomes  Education material provided:  General Meal Planning Guidelines Simple Meal Plan 3 Day Food Record  If problems or questions, patient to contact team via: Johny Drilling, RN, CCM, CDE [phone]  Future DSME appointment: 4-6 wks  Pt is not able to sit for Diabetes classes. She will attend the 2 Hour Refresher Program. Her last appointment is scheduled for Apr 30, 2018 with the dietitian.

## 2018-03-29 DIAGNOSIS — J449 Chronic obstructive pulmonary disease, unspecified: Secondary | ICD-10-CM | POA: Diagnosis not present

## 2018-03-29 NOTE — Progress Notes (Signed)
Daily Session Note  Patient Details  Name: Amanda Porter MRN: [id number] Date of Birth: 1946-06-18 Referring Provider:     Pulmonary Rehab from 03/02/2018 in [facility] Cardiac and Pulmonary Rehab  Referring Provider  Ramachandran      Encounter Date: 03/29/2018  Check In: Session Check In - 03/29/18 0942      Check-In   Location  [facility]-Cardiac & Pulmonary Rehab    Staff Present  Nada Maclachlan, BA, ACSM CEP, Exercise Physiologist;Kelly Amedeo Plenty, BS, ACSM CEP, Exercise Physiologist;Shonnie Poudrier Flavia Shipper    Supervising physician immediately available to respond to emergencies  [facility] immediately available ER MD    Physician(s)  Dr. Alfred Levins and Corky Downs    Medication changes reported      No    Fall or balance concerns reported     No    Tobacco Cessation  No Change    Warm-up and Cool-down  Performed as group-led instruction    Resistance Training Performed  Yes    VAD Patient?  No      Pain Assessment   Currently in Pain?  No/denies          Social History   Tobacco Use  Smoking Status Former Smoker  . Packs/day: 0.50  . Years: 50.00  . Pack years: 25.00  . Last attempt to quit: 08/01/2017  . Years since quitting: 0.6  Smokeless Tobacco Never Used    Goals Met:  Independence with exercise equipment Exercise tolerated well No report of cardiac concerns or symptoms Strength training completed today  Personal goals reviewed  Goals Unmet:  Not Applicable  Comments: Pt able to follow exercise prescription today without complaint.  Will continue to monitor for progression.   Dr. Emily Filbert is Medical Director for [facility] and [facility].

## 2018-03-31 ENCOUNTER — Encounter: Payer: Medicare Other | Attending: Internal Medicine

## 2018-03-31 DIAGNOSIS — F418 Other specified anxiety disorders: Secondary | ICD-10-CM | POA: Diagnosis not present

## 2018-03-31 DIAGNOSIS — Z85118 Personal history of other malignant neoplasm of bronchus and lung: Secondary | ICD-10-CM | POA: Diagnosis not present

## 2018-03-31 DIAGNOSIS — Z7902 Long term (current) use of antithrombotics/antiplatelets: Secondary | ICD-10-CM | POA: Insufficient documentation

## 2018-03-31 DIAGNOSIS — Z79899 Other long term (current) drug therapy: Secondary | ICD-10-CM | POA: Insufficient documentation

## 2018-03-31 DIAGNOSIS — Z87891 Personal history of nicotine dependence: Secondary | ICD-10-CM | POA: Insufficient documentation

## 2018-03-31 DIAGNOSIS — Z8673 Personal history of transient ischemic attack (TIA), and cerebral infarction without residual deficits: Secondary | ICD-10-CM | POA: Insufficient documentation

## 2018-03-31 DIAGNOSIS — J449 Chronic obstructive pulmonary disease, unspecified: Secondary | ICD-10-CM | POA: Diagnosis present

## 2018-03-31 DIAGNOSIS — Z7984 Long term (current) use of oral hypoglycemic drugs: Secondary | ICD-10-CM | POA: Diagnosis not present

## 2018-03-31 NOTE — Progress Notes (Signed)
Daily Session Note  Patient Details  Name: Amanda Porter MRN: [id number] Date of Birth: Jan 26, 1946 Referring Provider:     Pulmonary Rehab from 03/02/2018 in [facility] Card[facility] and Pulmonary Rehab  Referring Provider  Ramachandran      Encounter Date: 03/31/2018  Check In: Session Check In - 03/31/18 0950      Check-In   Location  [facility]-Cardiac & Pulmonary Rehab    Staff Present  Nada Maclachlan, BA, ACSM CEP, Exercise Physiologist;Mikeila Burgen Darrin Nipper, [place], RCEP, [place], Exercise Physiologist    Supervising physician immediately available to respond to emergencies  [facility] immediately available ER MD    Physician(s)  Dr. Alfred Levins and Burlene Arnt    Medication changes reported      No    Fall or balance concerns reported     No    Tobacco Cessation  No Change    Warm-up and Cool-down  Performed as group-led instruction    Resistance Training Performed  Yes    VAD Patient?  No      Pain Assessment   Currently in Pain?  No/denies          Social History   Tobacco Use  Smoking Status Former Smoker  . Packs/day: 0.50  . Years: 50.00  . Pack years: 25.00  . Last attempt to quit: 08/01/2017  . Years since quitting: 0.6  Smokeless Tobacco Never Used    Goals Met:  Independence with exercise equipment Exercise tolerated well No report of cardiac concerns or symptoms Strength training completed today  Goals Unmet:  Not Applicable  Comments: Pt able to follow exercise prescription today without complaint.  Will continue to monitor for progression.   Dr. Emily Filbert is Medical Director for [facility] and [facility].

## 2018-04-01 ENCOUNTER — Other Ambulatory Visit: Payer: Self-pay | Admitting: Primary Care

## 2018-04-01 ENCOUNTER — Telehealth: Payer: Self-pay

## 2018-04-01 DIAGNOSIS — G4700 Insomnia, unspecified: Secondary | ICD-10-CM

## 2018-04-01 DIAGNOSIS — J449 Chronic obstructive pulmonary disease, unspecified: Secondary | ICD-10-CM

## 2018-04-01 NOTE — Telephone Encounter (Signed)
Received faxed refill request for traZODone (DESYREL) 50 MG tablet  Last prescribed on seen on 02/18/2018.

## 2018-04-01 NOTE — Progress Notes (Signed)
Discharge Progress Report  Patient Details  Name: Amanda Porter MRN: [id number] Date of Birth: 11/03/1946 Referring Provider:     Pulmonary Rehab from 03/02/2018 in [facility] Cardiac and Pulmonary Rehab  Referring Provider  Ramachandran       Number of Visits: 7/36  Reason for Discharge:  Early Exit:  Personal  Smoking History:  Social History   Tobacco Use  Smoking Status Former Smoker  . Packs/day: 0.50  . Years: 50.00  . Pack years: 25.00  . Last attempt to quit: 08/01/2017  . Years since quitting: 0.6  Smokeless Tobacco Never Used    Diagnosis:  COPD with chronic bronchitis and emphysema ([place])  ADL [facility]SD:   Initial Exercise Prescription:   Discharge Exercise Prescription (Final Exercise Prescription Changes): Exercise Prescription Changes - 03/29/18 1100      Response to Exercise   Blood Pressure (Admit)  --    Blood Pressure (Exercise)  --    Blood Pressure (Exit)  --    Heart Rate (Admit)  --    Heart Rate (Exercise)  --    Heart Rate (Exit)  --    Oxygen Saturation (Admit)  --    Oxygen Saturation (Exercise)  --    Oxygen Saturation (Exit)  --    Rating of Perceived Exertion (Exercise)  --    Perceived Dyspnea (Exercise)  --    Symptoms  --    Duration  Continue with 45 min of aerobic exercise without signs/symptoms of physical distress.    Intensity  THRR unchanged      Progression   Progression  Continue to progress workloads to maintain intensity without signs/symptoms of physical distress.    Average METs  2.1      Resistance Training   Training Prescription  Yes    Weight  3 lb    Reps  10-15      Oxygen   Oxygen  Continuous    Liters  2      NuStep   Level  1    SPM  69    Minutes  15    METs  2.5      Biostep-RELP   Level  2    SPM  30    Minutes  15    METs  2      Track   Laps  22    Minutes  15    METs  2      Home Exercise Plan   Plans to continue exercise at  Home (comment) Plans to walk and do hand weight exercises      Frequency  Add 2 additional days to program exercise sessions.    Initial Home Exercises Provided  03/29/18       Functional Capacity:   Psychological, QOL, Others - Outcomes: PHQ 2/9: Depression screen [facility] 2/9 03/26/2018 03/02/2018  Decreased Interest 3 3  Down, Depressed, Hopeless 3 3  PHQ - 2 Score 6 6  Altered sleeping 3 3  Tired, decreased energy 3 3  Change in appetite 3 3  Feeling bad or failure about yourself  1 1  Trouble concentrating 0 1  Moving slowly or fidgety/restless 3 1  Suicidal thoughts 0 0  PHQ-9 Score 19 18  Difficult doing work/chores Extremely dIfficult Extremely dIfficult    Quality of Life:   Personal Goals: Goals established at orientation with interventions provided to work toward goal.    Personal Goals Discharge:   Exercise Goals and Review:  Nutrition & Weight - Outcomes:    Nutrition:   Nutrition Discharge:   Education Questionnaire Score:   Goals reviewed with patient; copy given to patient.

## 2018-04-01 NOTE — Progress Notes (Signed)
Pulmonary Individual Treatment Plan  Patient Details  Name: Amanda Porter MRN: [id number] Date of Birth: [birth year] Referring Provider:     Pulmonary Rehab from 03/02/2018 in [facility] Cardiac and Pulmonary Rehab  Referring Provider  Ramachandran      Initial Encounter Date:    Pulmonary Rehab from 03/02/2018 in [facility] Cardiac and Pulmonary Rehab  Date  03/02/18  Referring Provider  Ashby Dawes      Visit Diagnosis: COPD with chronic bronchitis and emphysema ([place])  Patient's Home Medications on Admission:  Current Outpatient Medications:  .  albuterol (PROVENTIL HFA;VENTOLIN HFA) 108 (90 Base) MCG/ACT inhaler, TAKE 2 PUFFS BY MOUTH EVERY 4 HOURS AS NEEDED, Disp: 8.5 Inhaler, Rfl: 2 .  amLODipine (NORVASC) 5 MG tablet, Take 5 mg by mouth daily. , Disp: , Rfl:  .  amoxicillin-clavulanate (AUGMENTIN) 875-125 MG tablet, Take 1 tablet by mouth 2 (two) times daily., Disp: 20 tablet, Rfl: 0 .  busPIRone (BUSPAR) 7.5 MG tablet, Take 1 tablet by mouth twice daily for anxiety., Disp: 180 tablet, Rfl: 3 .  clopidogrel (PLAVIX) 75 MG tablet, TAKE 1 TABLET BY MOUTH EVERY DAY, Disp: 90 tablet, Rfl: 2 .  Fluticasone-Umeclidin-Vilant (TRELEGY ELLIPTA) 100-62.5-25 MCG/INH AEPB, Inhale 1 Inhaler into the lungs daily., Disp: 1 each, Rfl: 5 .  glipiZIDE (GLIPIZIDE XL) 5 MG 24 hr tablet, Take 1 tablet by mouth once daily with breakfast for diabetes., Disp: 90 tablet, Rfl: 1 .  ipratropium-albuterol (DUONEB) 0.5-2.5 (3) MG/3ML SOLN, Take 33ms by nebulization every 6 (six) hours and prn. Dx:J44.9, Disp: 360 mL, Rfl: 5 .  losartan (COZAAR) 100 MG tablet, Take 1 tablet (100 mg total) by mouth daily., Disp: 90 tablet, Rfl: 3 .  Melatonin 5 MG TABS, Take 5 mg by mouth at bedtime. , Disp: , Rfl:  .  Multiple Vitamin (MULTIVITAMIN) capsule, Take 1 capsule by mouth daily., Disp: , Rfl:  .  Omega-3 Fatty Acids (FISH OIL PO), Take 520 mg by mouth daily. , Disp: , Rfl:  .  omeprazole (PRILOSEC) 20 MG capsule, Take 1  capsule (20 mg total) by mouth daily., Disp: 90 capsule, Rfl: 0 .  ONE TOUCH LANCETS MISC, 1 each by Other route 2 (two) times daily., Disp: 200 each, Rfl: 1 .  ONE TOUCH ULTRA TEST test strip, USE 1 TWICE A DAY AS INSTRUCTED, Disp: 100 each, Rfl: 5 .  OXYGEN, 2 L continuous. , Disp: , Rfl:  .  predniSONE (DELTASONE) 20 MG tablet, Take 2 tablets (40 mg total) by mouth daily., Disp: 8 tablet, Rfl: 0 .  rosuvastatin (CRESTOR) 5 MG tablet, Take 1 tablet (5 mg total) by mouth every evening., Disp: 90 tablet, Rfl: 2 .  traZODone (DESYREL) 50 MG tablet, Take 1/2 to 1 tablet at bedtime for sleep., Disp: 30 tablet, Rfl: 0  Past Medical History: Past Medical History:  Diagnosis Date  . Anxiety   . Anxiety and depression   . COPD (chronic obstructive pulmonary disease) ([place]   . [place]cerebral vascular [place]) [place]   . Dementia   . Depression   . Diabetes mellitus without complication ([place]   . Dysphasia   . History of lung cancer   . Hyperlipidemia   . Memory disturbance 11/20/2017  . Normal pressure hydrocephalus 2018  . Retinal detachment    Right  . Right carotid bruit 11/20/2017  . Thrombosis    Arterial to lower extremity?    Tobacco Use: Social History   Tobacco Use  Smoking Status Former Smoker  .  Packs/day: 0.50  . Years: 50.00  . Pack years: 25.00  . Last attempt to quit: 08/01/2017  . Years since quitting: 0.6  Smokeless Tobacco Never Used    Labs: Recent Review Scientist, physiological    Labs for ITP Cardiac and Pulmonary Rehab Latest Ref Rng & Units 09/15/2017 11/26/2017 02/18/2018   Cholestrol 0 - 200 mg/dL 157 - -   LDLCALC 0 - 99 mg/dL 77 - -   HDL >39.00 mg/dL 58.80 - -   Trlycerides 0.0 - 149.0 mg/dL 105.0 - -   Hemoglobin A1c 4.6 - 6.5 % - 7.0(H) 7.5(H)       Pulmonary Assessment Scores:   Pulmonary Function Assessment:   Exercise Target Goals:    Exercise Program Goal: Individual exercise prescription set using results from initial 6 min walk test and  THRR while considering  patient's activity barriers and safety.    Exercise Prescription Goal: Initial exercise prescription builds to 30-45 minutes a day of aerobic activity, 2-3 days per week.  Home exercise guidelines will be given to patient during program as part of exercise prescription that the participant will acknowledge.  Activity Barriers & Risk Stratification:   6 Minute Walk:  Oxygen Initial Assessment:   Oxygen Re-Evaluation:   Oxygen Discharge (Final Oxygen Re-Evaluation):   Initial Exercise Prescription:   Perform Capillary Blood Glucose checks as needed.  Exercise Prescription Changes: Exercise Prescription Changes    Row Name 03/24/18 1100 03/29/18 1100           Response to Exercise   Blood Pressure (Admit)  112/60  -      Blood Pressure (Exercise)  122/70  -      Blood Pressure (Exit)  108/56  -      Heart Rate (Admit)  118 bpm  -      Heart Rate (Exercise)  127 bpm  -      Heart Rate (Exit)  102 bpm  -      Oxygen Saturation (Admit)  96 %  -      Oxygen Saturation (Exercise)  94 %  -      Oxygen Saturation (Exit)  99 %  -      Rating of Perceived Exertion (Exercise)  13  -      Perceived Dyspnea (Exercise)  2  -      Symptoms  none  -      Duration  Continue with 45 min of aerobic exercise without signs/symptoms of physical distress.  Continue with 45 min of aerobic exercise without signs/symptoms of physical distress.      Intensity  THRR unchanged  THRR unchanged        Progression   Progression  Continue to progress workloads to maintain intensity without signs/symptoms of physical distress.  Continue to progress workloads to maintain intensity without signs/symptoms of physical distress.      Average METs  2.1  2.1        Resistance Training   Training Prescription  Yes  Yes      Weight  3 lb  3 lb      Reps  10-15  10-15        Oxygen   Oxygen  Continuous  Continuous      Liters  2  2        NuStep   Level  1  1      SPM  69  69       Minutes  15  15      METs  2.5  2.5        Biostep-RELP   Level  2  2      SPM  30  30      Minutes  15  15      METs  2  2        Track   Laps  22  22      Minutes  15  15      METs  2  2        Home Exercise Plan   Plans to continue exercise at  -  Home (comment) Plans to walk and do hand weight exercises       Frequency  -  Add 2 additional days to program exercise sessions.      Initial Home Exercises Provided  -  03/29/18         Exercise Comments:   Exercise Goals and Review:   Exercise Goals Re-Evaluation : Exercise Goals Re-Evaluation    Row Name 03/24/18 1142 03/29/18 1146           Exercise Goal Re-Evaluation   Exercise Goals Review  Increase Physical Activity;Able to understand and use rate of perceived exertion (RPE) scale;Increase Strength and Stamina  Increase Physical Activity;Increase Strength and Stamina;Able to understand and use rate of perceived exertion (RPE) scale;Able to check pulse independently;Knowledge and understanding of Target Heart Rate Range (THRR);Able to understand and use Dyspnea scale;Understanding of Exercise Prescription      Comments  Avayah tolerated exercise well.  Staff will continue to monitor  Home exercise guidelines were reviewed with patient today. She demonstrated understanding of these guidelines and plans to add 1-2 days of walking and hand weight exercises at home once she finishes moving in the next 2-3 weeks.       Expected Outcomes  Short - Arddean will continue to attend class Long - Pt will increase fitness level  Short - Navaya will add 1-2 days of walking and hand weight exercises at home on off days of class. Long - Pt will increase fitness level and develop independent exercise routine.          Discharge Exercise Prescription (Final Exercise Prescription Changes): Exercise Prescription Changes - 03/29/18 1100      Response to Exercise   Blood Pressure (Admit)  --    Blood Pressure (Exercise)  --    Blood  Pressure (Exit)  --    Heart Rate (Admit)  --    Heart Rate (Exercise)  --    Heart Rate (Exit)  --    Oxygen Saturation (Admit)  --    Oxygen Saturation (Exercise)  --    Oxygen Saturation (Exit)  --    Rating of Perceived Exertion (Exercise)  --    Perceived Dyspnea (Exercise)  --    Symptoms  --    Duration  Continue with 45 min of aerobic exercise without signs/symptoms of physical distress.    Intensity  THRR unchanged      Progression   Progression  Continue to progress workloads to maintain intensity without signs/symptoms of physical distress.    Average METs  2.1      Resistance Training   Training Prescription  Yes    Weight  3 lb    Reps  10-15      Oxygen   Oxygen  Continuous    Liters  2  NuStep   Level  1    SPM  69    Minutes  15    METs  2.5      Biostep-RELP   Level  2    SPM  30    Minutes  15    METs  2      Track   Laps  22    Minutes  15    METs  2      Home Exercise Plan   Plans to continue exercise at  Home (comment) Plans to walk and do hand weight exercises     Frequency  Add 2 additional days to program exercise sessions.    Initial Home Exercises Provided  03/29/18       Nutrition:  Target Goals: Understanding of nutrition guidelines, daily intake of sodium <1568m, cholesterol <2052m calories 30% from fat and 7% or less from saturated fats, daily to have 5 or more servings of fruits and vegetables.  Biometrics:    Nutrition Therapy Plan and Nutrition Goals:   Nutrition Assessments:   Nutrition Goals Re-Evaluation:   Nutrition Goals Discharge (Final Nutrition Goals Re-Evaluation):   Psychosocial: Target Goals: Acknowledge presence or absence of significant depression and/or stress, maximize coping skills, provide positive support system. Participant is able to verbalize types and ability to use techniques and skills needed for reducing stress and depression.   Initial Review & Psychosocial Screening:   Quality  of Life Scores:  Scores of 19 and below usually indicate a poorer quality of life in these areas.  A difference of  2-3 points is a clinically meaningful difference.  A difference of 2-3 points in the total score of the Quality of Life Index has been associated with significant improvement in overall quality of life, self-image, physical symptoms, and general health in studies assessing change in quality of life.  PHQ-9: Recent Review Flowsheet Data    [facility] 03/26/2018 03/02/2018   Decreased Interest 3 3   Down, Depressed, Hopeless 3 3   PHQ - 2 Score 6 6   Altered sleeping 3 3   Tired, decreased energy 3 3   Change in appetite 3 3   Feeling bad or failure about yourself  1 1   Trouble concentrating 0 1   Moving slowly or fidgety/restless 3 1   Suicidal thoughts 0 0   PHQ-9 Score 19 18   Difficult doing work/chores Extremely dIfficult Extremely dIfficult     Interpretation of Total Score  Total Score Depression Severity:  1-4 = Minimal depression, 5-9 = Mild depression, 10-14 = Moderate depression, 15-19 = Moderately severe depression, 20-27 = Severe depression   Psychosocial Evaluation and Intervention: Psychosocial Evaluation - 03/22/18 1055      Psychosocial Evaluation & Interventions   Interventions  Stress management education;Relaxation education;Encouraged to exercise with the program and follow exercise prescription    Comments  Counselor met with Ms. Alston-Brashier (AJosefina Dotoday for initial psychosocial evaluation.  She is a [age]ear old who has COPD.  She has a strong support system as she just moved in with her daughter recently and has some friends and a church community locally.  Nakenya had a stroke in 2002 limiting her ability to walk - especially on her left leg.  She was also recently diagnosed with dementia; but this counselor saw no signs of that during this evaluation.  Phelan reports not sleeping well with waking up around 2:30 in the morning after  maybe 5  hours of sleep and then she has difficulty getting back to sleep.  She reports some use of a natural sleep aid and counselor suggested speaking with her Dr. or pharmacist about a controlled release OTC sleep aid - since Rayla is reluctant to take "more medication."  She states her appetite is "bad" currently with no taste to the foods she eats and wonders if that is medication related.  She reports a history of anxiety and is currently on a low dose of medication to help with this.  Laurna states the stress in her life has increased recently with the move in September to her daughter's and they are moving again into a new townhouse in a few weeks.  Also her health is stressful for her at this time.  She has goals to walk better and be able to live more independently again.  Naesha's PHQ-9 scores were "18" indicating moderately severe for symptoms of depression.  She suggested this is due to the stress of moving this past September and then again in a few weeks.  Also, her lack of sleep impacts this as well.  Counselor encouraged exercise and speaking with her Dr. about sleep to help cope better during this transition.  Counselor will follow.     Expected Outcomes  Short - Ellianna will speak with her Dr/pharmacist about her sleep concerns.   Long -   Niomi will exercise for stress and her health overall.     Continue Psychosocial Services   Follow up required by counselor       Psychosocial Re-Evaluation:   Psychosocial Discharge (Final Psychosocial Re-Evaluation):   Education: Education Goals: Education classes will be provided on a weekly basis, covering required topics. Participant will state understanding/return demonstration of topics presented.  Learning Barriers/Preferences:   Education Topics:  Initial Evaluation Education: - Verbal, written and demonstration of respiratory meds, oximetry and breathing techniques. Instruction on use of nebulizers and MDIs and importance of  monitoring MDI activations.   Pulmonary Rehab from 03/31/2018 in [facility] Cardiac and Pulmonary Rehab  Date  03/02/18  Educator  [facility]  Instruction Review Code  1- Verbalizes Understanding      General Nutrition Guidelines/Fats and Fiber: -Group instruction provided by verbal, written material, models and posters to present the general guidelines for heart healthy nutrition. Gives an explanation and review of dietary fats and fiber.   Controlling Sodium/Reading Food Labels: -Group verbal and written material supporting the discussion of sodium use in heart healthy nutrition. Review and explanation with models, verbal and written materials for utilization of the food label.   Exercise Physiology & General Exercise Guidelines: - Group verbal and written instruction with models to review the exercise physiology of the cardiovascular system and associated critical values. Provides general exercise guidelines with specific guidelines to those with heart or lung disease.    Pulmonary Rehab from 03/31/2018 in [facility] Cardiac and Pulmonary Rehab  Date  03/10/18  Educator  jh      Aerobic Exercise & Resistance Training: - Gives group verbal and written instruction on the various components of exercise. Focuses on aerobic and resistive training programs and the benefits of this training and how to safely progress through these programs.   Pulmonary Rehab from 03/31/2018 in [facility] Cardiac and Pulmonary Rehab  Date  03/26/18  Educator  [facility]  Instruction Review Code  1- Verbalizes Understanding      Flexibility, Balance, Mind/Body Relaxation: Provides group verbal/written instruction on the benefits of flexibility and balance training, including  mind/body exercise modes such as yoga, pilates and tai chi.  Demonstration and skill practice provided.   Stress and Anxiety: - Provides group verbal and written instruction about the health risks of elevated stress and causes of high stress.  Discuss the correlation  between heart/lung disease and anxiety and treatment options. Review healthy ways to manage with stress and anxiety.   Depression: - Provides group verbal and written instruction on the correlation between heart/lung disease and depressed mood, treatment options, and the stigmas associated with seeking treatment.   Exercise & Equipment Safety: - Individual verbal instruction and demonstration of equipment use and safety with use of the equipment.   Pulmonary Rehab from 03/31/2018 in [facility] Cardiac and Pulmonary Rehab  Date  03/02/18  Educator  [facility]  Instruction Review Code  1- Verbalizes Understanding      Infection Prevention: - Provides verbal and written material to individual with discussion of infection control including proper hand washing and proper equipment cleaning during exercise session.   Pulmonary Rehab from 03/31/2018 in [facility] Cardiac and Pulmonary Rehab  Date  03/02/18  Educator  [facility]  Instruction Review Code  1- Verbalizes Understanding      Falls Prevention: - Provides verbal and written material to individual with discussion of falls prevention and safety.   Pulmonary Rehab from 03/31/2018 in [facility] Cardiac and Pulmonary Rehab  Date  03/02/18  Educator  [facility]  Instruction Review Code  1- Verbalizes Understanding      Diabetes: - Individual verbal and written instruction to review signs/symptoms of diabetes, desired ranges of glucose level fasting, after meals and with exercise. Advice that pre and post exercise glucose checks will be done for 3 sessions at entry of program.   Pulmonary Rehab from 03/31/2018 in [facility] Cardiac and Pulmonary Rehab  Date  03/02/18  Educator  [facility]  Instruction Review Code  1- Verbalizes Understanding      Chronic Lung Diseases: - Group verbal and written instruction to review updates, respiratory medications, advancements in procedures and treatments. Discuss use of supplemental oxygen including available portable oxygen systems, continuous and  intermittent flow rates, concentrators, personal use and safety guidelines. Review proper use of inhaler and spacers. Provide informative websites for self-education.    Energy Conservation: - Provide group verbal and written instruction for methods to conserve energy, plan and organize activities. Instruct on pacing techniques, use of adaptive equipment and posture/positioning to relieve shortness of breath.   Triggers and Exacerbations: - Group verbal and written instruction to review types of environmental triggers and ways to prevent exacerbations. Discuss weather changes, air quality and the benefits of nasal washing. Review warning signs and symptoms to help prevent infections. Discuss techniques for effective airway clearance, coughing, and vibrations.   AED/CPR: - Group verbal and written instruction with the use of models to demonstrate the basic use of the AED with the basic ABC's of resuscitation.   Anatomy and Physiology of the Lungs: - Group verbal and written instruction with the use of models to provide basic lung anatomy and physiology related to function, structure and complications of lung disease.   Anatomy & Physiology of the Heart: - Group verbal and written instruction and models provide basic cardiac anatomy and physiology, with the coronary electrical and arterial systems. Review of Valvular disease and Heart Failure   Pulmonary Rehab from 03/31/2018 in [facility] Cardiac and Pulmonary Rehab  Date  03/31/18  Educator  [facility]  Instruction Review Code  1- Verbalizes Understanding  Cardiac Medications: - Group verbal and written instruction to review commonly prescribed medications for heart disease. Reviews the medication, class of the drug, and side effects.   Know Your Numbers and Risk Factors: -Group verbal and written instruction about important numbers in your health.  Discussion of what are risk factors and how they play a role in the disease process.  Review of  Cholesterol, Blood Pressure, Diabetes, and BMI and the role they play in your overall health.   Sleep Hygiene: -Provides group verbal and written instruction about how sleep can affect your health.  Define sleep hygiene, discuss sleep cycles and impact of sleep habits. Review good sleep hygiene tips.    Pulmonary Rehab from 03/31/2018 in [facility] Cardiac and Pulmonary Rehab  Date  03/17/18  Educator  [facility]  Instruction Review Code  1- Verbalizes Understanding      Other: -Provides group and verbal instruction on various topics (see comments)    Knowledge Questionnaire Score:    Core Components/Risk Factors/Patient Goals at Admission:   Core Components/Risk Factors/Patient Goals Review:    Core Components/Risk Factors/Patient Goals at Discharge (Final Review):    ITP Comments: ITP Comments    Row Name 03/24/18 0956 04/01/18 1548 04/01/18 1551       ITP Comments  Brad's daughter called to let us know that her mom would be out today as they were on their way to the emergency room.   Patients daughter called to say that they are moving and Ryna is not feeling up to attending [place]. Informed patients daughter that when she is ready to return she will need a new referral from her physician. Patients daughter verbalizes understanding. Patient discharged.  Discharge ITP sent and signed by Dr. Sabra Heck.  Discharge Summary routed to PCP and pulmonologist        Comments: Discharge ITP

## 2018-04-01 NOTE — Telephone Encounter (Signed)
Patients daughter called to say that they are moving and Amanda Porter is not feeling up to attending [place]. Informed patients daughter that when she is ready to return she will need a new referral from her physician. Patients daughter verbalizes understanding. Patient discharged.

## 2018-04-02 ENCOUNTER — Encounter: Payer: Self-pay | Admitting: Primary Care

## 2018-04-02 ENCOUNTER — Telehealth: Payer: Self-pay | Admitting: Primary Care

## 2018-04-02 ENCOUNTER — Ambulatory Visit: Payer: Medicare Other | Admitting: Primary Care

## 2018-04-02 VITALS — BP 110/54 | HR 93 | Temp 98.0°F | Ht 62.0 in | Wt 115.8 lb

## 2018-04-02 DIAGNOSIS — F329 Major depressive disorder, single episode, unspecified: Secondary | ICD-10-CM | POA: Diagnosis not present

## 2018-04-02 DIAGNOSIS — G4700 Insomnia, unspecified: Secondary | ICD-10-CM

## 2018-04-02 DIAGNOSIS — F419 Anxiety disorder, unspecified: Secondary | ICD-10-CM | POA: Diagnosis not present

## 2018-04-02 DIAGNOSIS — D649 Anemia, unspecified: Secondary | ICD-10-CM | POA: Diagnosis not present

## 2018-04-02 DIAGNOSIS — J449 Chronic obstructive pulmonary disease, unspecified: Secondary | ICD-10-CM | POA: Diagnosis not present

## 2018-04-02 DIAGNOSIS — F32A Depression, unspecified: Secondary | ICD-10-CM

## 2018-04-02 LAB — IBC PANEL
Iron: 49 ug/dL (ref 42–145)
Saturation Ratios: 9.8 % — ABNORMAL LOW (ref 20.0–50.0)
Transferrin: 357 mg/dL (ref 212.0–360.0)

## 2018-04-02 LAB — BASIC METABOLIC PANEL
BUN: 17 mg/dL (ref 6–23)
CALCIUM: 9.6 mg/dL (ref 8.4–10.5)
CO2: 27 mEq/L (ref 19–32)
CREATININE: 0.78 mg/dL (ref 0.40–1.20)
Chloride: 105 mEq/L (ref 96–112)
GFR: 93.08 mL/min (ref >60.00)
GLUCOSE: 153 mg/dL — AB (ref 70–99)
Potassium: 4.3 mEq/L (ref 3.5–5.1)
Sodium: 141 mEq/L (ref 135–145)

## 2018-04-02 LAB — CBC
HEMATOCRIT: 28.1 % — AB (ref 36.0–46.0)
Hemoglobin: 9.1 g/dL — ABNORMAL LOW (ref 12.0–15.0)
MCHC: 32.4 g/dL (ref 30.0–36.0)
MCV: 88 fl (ref 78.0–100.0)
PLATELETS: 368 10*3/uL (ref 150.0–400.0)
RBC: 3.19 Mil/uL — AB (ref 3.87–5.11)
RDW: 14.1 % (ref 11.5–15.5)
WBC: 11.1 10*3/uL — ABNORMAL HIGH (ref 4.0–10.5)

## 2018-04-02 LAB — VITAMIN B12: Vitamin B-12: 1184 pg/mL — ABNORMAL HIGH (ref 211–911)

## 2018-04-02 MED ORDER — TRAZODONE HCL 50 MG PO TABS: ORAL_TABLET | ORAL | 0 refills | Status: DC

## 2018-04-02 MED ORDER — VENLAFAXINE HCL ER 37.5 MG PO CP24: ORAL_CAPSULE | ORAL | 1 refills | Status: DC

## 2018-04-02 NOTE — Assessment & Plan Note (Signed)
Recommended to resume pulmonary rehabilitation.  Continue continuous home oxygen and inhaler regimen. Repeat CBC today, add on B 12 given continued fatigue and weakness.   All hospital labs, notes reviewed.

## 2018-04-02 NOTE — Telephone Encounter (Signed)
Copied from [place] [phone]. Topic: General - Other >> Apr 02, 2018  2:47 PM Yvette Rack wrote: Reason for CRM: patient daughter [name] calling for lab results she can't see any messages in [facility] and results please call her at [phone] pt need answer to results before provider leave today

## 2018-04-02 NOTE — Progress Notes (Signed)
Subjective:    Patient ID: Amanda Porter, female    DOB: 07/09/1946, 72 y.o.   MRN: [id number]  HPI  Amanda Porter is a 73 year old female who presents today for emergency department follow up.  She presented to [facility] ED on 03/24/18 with a three day history of progressing shortness of breath and weakness. She denied cough, fevers, chest pain. Symptoms improved with rest. She's been using her nebulizer and ICS medications at home.  During her stay in the ED she was treated with prednisone, antibiotics, IV fluids with improvement in symptoms. Labs without leukocytosis and mild dehydration. She was released later that day given improvement in symptoms.  Since her visit in the ED she's continued to feel shortness of breath upon exertion such as walking, cleaning her home, etc. She continues to feel weak. She is scheduled to see her pulmonologist in late May. She is wearing her oxygen continuously. She participated in pulmonary rehabilitation for several sessions and requested to stop as she will be moving into a new house.   Her daughter thinks she has anxiety about going to pulmonary rehabilitation. She feels nervous, anxious, feels "jumpy" most every day, throughout the day. She is compliant to Buspar twice daily as prescribed. She didn't like the way she felt on Celexa, Lexapro, Zoloft.   Review of Systems  Respiratory: Positive for shortness of breath.   Cardiovascular: Negative for chest pain.  Neurological: Positive for weakness. Negative for dizziness, light-headedness and headaches.  Psychiatric/Behavioral: The patient is nervous/anxious.        Past Medical History:  Diagnosis Date  . Anxiety   . Anxiety and depression   . COPD (chronic obstructive pulmonary disease) ([place])   . CVA (cerebral vascular accident) ([place])   . Dementia   . Depression   . Diabetes mellitus without complication ([place])   . Dysphasia   . History of lung cancer   . Hyperlipidemia   . Memory  disturbance 11/20/2017  . Normal pressure hydrocephalus 2018  . Retinal detachment    Right  . Right carotid bruit 11/20/2017  . Thrombosis    Arterial to lower extremity?     Social History   Socioeconomic History  . Marital status: Divorced    Spouse name: Not on file  . Number of children: 1  . Years of education: Not on file  . Highest education level: Not on file  Occupational History  . Occupation: Retired  Scientific laboratory technician  . Financial resource strain: Not on file  . Food insecurity:    Worry: Not on file    Inability: Not on file  . Transportation needs:    Medical: Not on file    Non-medical: Not on file  Tobacco Use  . Smoking status: Former Smoker    Packs/day: 0.50    Years: 50.00    Pack years: 25.00    Last attempt to quit: 08/01/2017    Years since quitting: 0.6  . Smokeless tobacco: Never Used  Substance and Sexual Activity  . Alcohol use: No  . Drug use: No  . Sexual activity: Not on file  Lifestyle  . Physical activity:    Days per week: Not on file    Minutes per session: Not on file  . Stress: Not on file  Relationships  . Social connections:    Talks on phone: Not on file    Gets together: Not on file    Attends religious service: Not on file  Active member of club or organization: Not on file    Attends meetings of clubs or organizations: Not on file    Relationship status: Not on file  . Intimate partner violence:    Fear of current or ex partner: Not on file    Emotionally abused: Not on file    Physically abused: Not on file    Forced sexual activity: Not on file  Other Topics Concern  . Not on file  Social History Narrative   Moved from [place].   Lives with daughter.   Right handed    Past Surgical History:  Procedure Laterality Date  . LUNG REMOVAL, PARTIAL     left upper lobe  . VENTRICULOPERITONEAL SHUNT  12/09/2016    Family History  Problem Relation Age of Onset  . Pneumonia Mother   . Cancer Father   . Diabetes  Sister   . Diabetes Sister     Allergies  Allergen Reactions  . Lexapro [Escitalopram Oxalate] Other (See Comments)    Dizziness, nausea, fatigue  . Neosporin  [Neomycin-Polymyxin-Gramicidin] Rash    (Neosporin)    Current Outpatient Medications on File Prior to Visit  Medication Sig Dispense Refill  . albuterol (PROVENTIL HFA;VENTOLIN HFA) 108 (90 Base) MCG/ACT inhaler TAKE 2 PUFFS BY MOUTH EVERY 4 HOURS AS NEEDED 8.5 Inhaler 2  . amLODipine (NORVASC) 5 MG tablet Take 5 mg by mouth daily.     Marland Kitchen amoxicillin-clavulanate (AUGMENTIN) 875-125 MG tablet Take 1 tablet by mouth 2 (two) times daily. 20 tablet 0  . busPIRone (BUSPAR) 7.5 MG tablet Take 1 tablet by mouth twice daily for anxiety. 180 tablet 3  . clopidogrel (PLAVIX) 75 MG tablet TAKE 1 TABLET BY MOUTH EVERY DAY 90 tablet 2  . Fluticasone-Umeclidin-Vilant (TRELEGY ELLIPTA) 100-62.5-25 MCG/INH AEPB Inhale 1 Inhaler into the lungs daily. 1 each 5  . glipiZIDE (GLIPIZIDE XL) 5 MG 24 hr tablet Take 1 tablet by mouth once daily with breakfast for diabetes. 90 tablet 1  . ipratropium-albuterol (DUONEB) 0.5-2.5 (3) MG/3ML SOLN Take 18mLs by nebulization every 6 (six) hours and prn. Dx:J44.9 360 mL 5  . losartan (COZAAR) 100 MG tablet Take 1 tablet (100 mg total) by mouth daily. 90 tablet 3  . Melatonin 5 MG TABS Take 5 mg by mouth at bedtime.     . Multiple Vitamin (MULTIVITAMIN) capsule Take 1 capsule by mouth daily.    . Omega-3 Fatty Acids (FISH OIL PO) Take 520 mg by mouth daily.     Marland Kitchen omeprazole (PRILOSEC) 20 MG capsule Take 1 capsule (20 mg total) by mouth daily. 90 capsule 0  . ONE TOUCH LANCETS MISC 1 each by Other route 2 (two) times daily. 200 each 1  . ONE TOUCH ULTRA TEST test strip USE 1 TWICE A DAY AS INSTRUCTED 100 each 5  . OXYGEN 2 L continuous.     . rosuvastatin (CRESTOR) 5 MG tablet Take 1 tablet (5 mg total) by mouth every evening. 90 tablet 2  . traZODone (DESYREL) 50 MG tablet Take 1/2 to 1 tablet at bedtime for  sleep. 30 tablet 0   No current facility-administered medications on file prior to visit.     BP (!) 110/54   Pulse 93   Temp 98 F (36.7 C) (Oral)   Ht 5\' 2"  (1.575 m)   Wt 115 lb 12 oz (52.5 kg)   SpO2 96%   BMI 21.17 kg/m    Objective:   Physical Exam  Constitutional: She  is oriented to person, place, and time. She appears well-nourished.  Cardiovascular: Normal rate and regular rhythm.  Pulmonary/Chest: Effort normal and breath sounds normal. No respiratory distress. She has no wheezes.  Neurological: She is alert and oriented to person, place, and time.  Psychiatric: She has a normal mood and affect.          Assessment & Plan:

## 2018-04-02 NOTE — Patient Instructions (Addendum)
Start venlafaxine ER (Effexor) 37.5 mg capsules for anxiety. Take 1 capsule by mouth every morning.   Resume pulmonary rehabilitation as discussed.  Stop by the lab prior to leaving today. I will notify you of your results once received.   Schedule a follow up visit in 6 weeks for re-evaluation.  It was a pleasure to see you today!

## 2018-04-02 NOTE — Assessment & Plan Note (Addendum)
No improvement in anxiety with Buspar, couldn't tolerate Zoloft, Celexa, Lexapro. Discussed various options and will trial low dose Effexor.  Discussed for her to trial the medication for at least 4-6 weeks before stopping. Will see her at that time for follow up.  Refilled Trazodone.

## 2018-04-05 NOTE — Telephone Encounter (Signed)
[name] is aware of this on 04/02/2018.

## 2018-04-12 ENCOUNTER — Encounter: Payer: Self-pay | Admitting: Primary Care

## 2018-04-12 ENCOUNTER — Ambulatory Visit: Payer: Medicare Other | Admitting: Family Medicine

## 2018-04-12 ENCOUNTER — Ambulatory Visit: Payer: Medicare Other | Admitting: Primary Care

## 2018-04-12 VITALS — BP 112/50 | HR 90 | Temp 98.5°F | Ht 62.0 in | Wt 115.2 lb

## 2018-04-12 DIAGNOSIS — D649 Anemia, unspecified: Secondary | ICD-10-CM

## 2018-04-12 DIAGNOSIS — K1379 Other lesions of oral mucosa: Secondary | ICD-10-CM | POA: Diagnosis not present

## 2018-04-12 MED ORDER — MAGIC MOUTHWASH W/LIDOCAINE: 5.0000 mL | Freq: Four times a day (QID) | ORAL | 0 refills | Status: DC | PRN

## 2018-04-12 NOTE — Progress Notes (Signed)
Subjective:    Patient ID: Amanda Porter, female    DOB: February 28, 1946, 72 y.o.   MRN: [id number]  HPI  Ms. Zalenski is a 72 year old female with a history of anxiety, dementia, type 2 diabetes, COPD, hypertension who presents today with multiple complaints.  1) Oral Bleeding: Underwent endoscopy on 06/01/2017, normal esophagus and duodenum, non erosive gastritis in antrum. It was recommended she continue her PPI indefinitely.   She wears dentures, has noticed bright red bleeding to the roof of her mouth after removing her dentures. She's noticed this for a few weeks when brushing her teeth. This is intermittent and only occurs when removing her dentures and rinsing her mouth, does not continue throughout the day. She has noticed irritation to her bottom dentures. Her daughter thinks she saw irritation to the roof her her mouth this morning.   She doesn't feel as though food gets caught in her mouth in her upper dentures. She does not remove her dentures at night, wears them 24 hours daily, every day of the week.  2) Dizziness: Continues since before her last hospital stay. During her last visit her CBC showed hemoglobin of 9.8 so she was initiated on oral iron. She's been compliant to her iron daily without constipation or GI upset. She's checking her BP at home which is running 110/50's-60's. She's managed on losartan 100 mg. She denies chest pain, changes in dyspnea, changes in vision.   BP Readings from Last 3 Encounters:  04/12/18 (!) 112/50  04/02/18 (!) 110/54  03/26/18 110/60     Review of Systems  HENT:       Oral bleeding  Eyes: Negative for visual disturbance.  Cardiovascular: Negative for chest pain.  Neurological: Positive for dizziness.       Past Medical History:  Diagnosis Date  . Anxiety   . Anxiety and depression   . COPD (chronic obstructive pulmonary disease) ([place])   . CVA (cerebral vascular accident) ([place])   . Dementia   . Depression   . Diabetes  mellitus without complication ([place])   . Dysphasia   . History of lung cancer   . Hyperlipidemia   . Memory disturbance 11/20/2017  . Normal pressure hydrocephalus 2018  . Retinal detachment    Right  . Right carotid bruit 11/20/2017  . Thrombosis    Arterial to lower extremity?     Social History   Socioeconomic History  . Marital status: Divorced    Spouse name: Not on file  . Number of children: 1  . Years of education: Not on file  . Highest education level: Not on file  Occupational History  . Occupation: Retired  Scientific laboratory technician  . Financial resource strain: Not on file  . Food insecurity:    Worry: Not on file    Inability: Not on file  . Transportation needs:    Medical: Not on file    Non-medical: Not on file  Tobacco Use  . Smoking status: Former Smoker    Packs/day: 0.50    Years: 50.00    Pack years: 25.00    Last attempt to quit: 08/01/2017    Years since quitting: 0.6  . Smokeless tobacco: Never Used  Substance and Sexual Activity  . Alcohol use: No  . Drug use: No  . Sexual activity: Not on file  Lifestyle  . Physical activity:    Days per week: Not on file    Minutes per session: Not on file  . Stress:  Not on file  Relationships  . Social connections:    Talks on phone: Not on file    Gets together: Not on file    Attends religious service: Not on file    Active member of club or organization: Not on file    Attends meetings of clubs or organizations: Not on file    Relationship status: Not on file  . Intimate partner violence:    Fear of current or ex partner: Not on file    Emotionally abused: Not on file    Physically abused: Not on file    Forced sexual activity: Not on file  Other Topics Concern  . Not on file  Social History Narrative   Moved from [place].   Lives with daughter.   Right handed    Past Surgical History:  Procedure Laterality Date  . LUNG REMOVAL, PARTIAL     left upper lobe  . VENTRICULOPERITONEAL SHUNT  12/09/2016      Family History  Problem Relation Age of Onset  . Pneumonia Mother   . Cancer Father   . Diabetes Sister   . Diabetes Sister     Allergies  Allergen Reactions  . Lexapro [Escitalopram Oxalate] Other (See Comments)    Dizziness, nausea, fatigue  . Neosporin  [Neomycin-Polymyxin-Gramicidin] Rash    (Neosporin)    Current Outpatient Medications on File Prior to Visit  Medication Sig Dispense Refill  . albuterol (PROVENTIL HFA;VENTOLIN HFA) 108 (90 Base) MCG/ACT inhaler TAKE 2 PUFFS BY MOUTH EVERY 4 HOURS AS NEEDED 8.5 Inhaler 2  . amLODipine (NORVASC) 5 MG tablet Take 5 mg by mouth daily.     Marland Kitchen amoxicillin-clavulanate (AUGMENTIN) 875-125 MG tablet Take 1 tablet by mouth 2 (two) times daily. 20 tablet 0  . busPIRone (BUSPAR) 7.5 MG tablet Take 1 tablet by mouth twice daily for anxiety. 180 tablet 3  . clopidogrel (PLAVIX) 75 MG tablet TAKE 1 TABLET BY MOUTH EVERY DAY 90 tablet 2  . Fluticasone-Umeclidin-Vilant (TRELEGY ELLIPTA) 100-62.5-25 MCG/INH AEPB Inhale 1 Inhaler into the lungs daily. 1 each 5  . glipiZIDE (GLIPIZIDE XL) 5 MG 24 hr tablet Take 1 tablet by mouth once daily with breakfast for diabetes. 90 tablet 1  . ipratropium-albuterol (DUONEB) 0.5-2.5 (3) MG/3ML SOLN Take 27mLs by nebulization every 6 (six) hours and prn. Dx:J44.9 360 mL 5  . losartan (COZAAR) 100 MG tablet Take 1 tablet (100 mg total) by mouth daily. 90 tablet 3  . Melatonin 5 MG TABS Take 5 mg by mouth at bedtime.     . Multiple Vitamin (MULTIVITAMIN) capsule Take 1 capsule by mouth daily.    . Omega-3 Fatty Acids (FISH OIL PO) Take 520 mg by mouth daily.     Marland Kitchen omeprazole (PRILOSEC) 20 MG capsule Take 1 capsule (20 mg total) by mouth daily. 90 capsule 0  . ONE TOUCH LANCETS MISC 1 each by Other route 2 (two) times daily. 200 each 1  . ONE TOUCH ULTRA TEST test strip USE 1 TWICE A DAY AS INSTRUCTED 100 each 5  . OXYGEN 2 L continuous.     . rosuvastatin (CRESTOR) 5 MG tablet Take 1 tablet (5 mg total) by  mouth every evening. 90 tablet 2  . traZODone (DESYREL) 50 MG tablet Take 1/2 to 1 tablet at bedtime for sleep. 90 tablet 0  . venlafaxine XR (EFFEXOR XR) 37.5 MG 24 hr capsule Take 1 capsule by mouth once daily with breakfast for anxiety. 30 capsule 1  No current facility-administered medications on file prior to visit.     BP (!) 112/50   Pulse 90   Temp 98.5 F (36.9 C) (Oral)   Ht 5\' 2"  (1.575 m)   Wt 115 lb 4 oz (52.3 kg)   SpO2 98%   BMI 21.08 kg/m    Objective:   Physical Exam  Constitutional: She is oriented to person, place, and time. She appears well-nourished. No distress.  HENT:  Slight bleeding to upper soft palate of oral cavity once dentures were removed. Bleeding subsided within a few minutes.   Cardiovascular: Normal rate.  Pulmonary/Chest: Effort normal.  Neurological: She is alert and oriented to person, place, and time.          Assessment & Plan:  Oral Bleeding:  To oral cavity for several days. Exam today with obvious irritation to oral cavity secondary to dentures.  Discussed to start removing dentures at night, every night. Rx for magic mouth wash provided to use PRN.  Discussed to establish with dentist for denture evaluation if symptoms persisted.   Pleas Koch, NP

## 2018-04-12 NOTE — Patient Instructions (Addendum)
We've reduced your losartan to 50 mg, please pick up the new prescription at the pharmacy.  Start taking out your dentures nightly as this is causing irritation to your mouth.   Continue taking your oral iron as discussed.   It was a pleasure to see you today!

## 2018-04-12 NOTE — Assessment & Plan Note (Signed)
Recent hemoglobin of 9.8. Compliant to oral iron daily, continue same. Repeat CBC at next office visit.

## 2018-04-13 ENCOUNTER — Ambulatory Visit: Payer: Medicare Other | Admitting: Internal Medicine

## 2018-04-15 ENCOUNTER — Encounter: Payer: Self-pay | Admitting: Primary Care

## 2018-04-19 ENCOUNTER — Encounter: Payer: Self-pay | Admitting: Primary Care

## 2018-04-24 ENCOUNTER — Other Ambulatory Visit: Payer: Self-pay | Admitting: Primary Care

## 2018-04-24 DIAGNOSIS — F32A Depression, unspecified: Secondary | ICD-10-CM

## 2018-04-24 DIAGNOSIS — F419 Anxiety disorder, unspecified: Principal | ICD-10-CM

## 2018-04-24 DIAGNOSIS — F329 Major depressive disorder, single episode, unspecified: Secondary | ICD-10-CM

## 2018-04-27 ENCOUNTER — Ambulatory Visit: Payer: Self-pay | Admitting: Internal Medicine

## 2018-04-28 ENCOUNTER — Encounter: Payer: Self-pay | Admitting: Internal Medicine

## 2018-04-28 ENCOUNTER — Ambulatory Visit: Payer: Medicare Other | Admitting: Internal Medicine

## 2018-04-28 VITALS — BP 118/48 | HR 90 | Resp 16 | Ht 62.0 in | Wt 114.0 lb

## 2018-04-28 DIAGNOSIS — J449 Chronic obstructive pulmonary disease, unspecified: Secondary | ICD-10-CM | POA: Diagnosis not present

## 2018-04-28 DIAGNOSIS — G4719 Other hypersomnia: Secondary | ICD-10-CM | POA: Diagnosis not present

## 2018-04-28 NOTE — Patient Instructions (Signed)
Will send for sleep study.  Restart pulmonary rehab.

## 2018-04-28 NOTE — Progress Notes (Signed)
[facility] Consultation     Assessment and Plan:  72 year old female with history of lung cancer and lobectomy approximately 20 years ago, now with emphysema and progressive dyspnea on exertion.  COPD with chronic bronchitis. -Severe oxygen dependent emphysema, FEV1 equals 45% -Trilogy inhaler. --History or LUL lobectomy for lung ca in 2000.   Dyspnea on exertion with deconditioning. -Dyspnea on exertion is likely multifactorial from severe COPD, deconditioning, previous lobectomy. -Discussed the importance of increasing her activity level and keeping her energy level up.  -Referreded to pulm rehab at previous visit, she started but then stopped.  She is considering restarting, and encouraged her to do so.  Nicotine abuse. -She has recently stopped smoking and is encouraged to continue smoking cessation.  Dementia. -Continue Trilogy inhaler and use Ventolin as needed.  Orders Placed This Encounter  Procedures  . AMB referral to pulmonary rehabilitation  . Split night study    Return in about 3 months (around 07/29/2018).   Date: 04/28/2018  MRN# [id number] Amanda Porter 1946/03/08    Amanda Porter is a 72 y.o. old female seen in consultation for chief complaint of:    Chief Complaint  Patient presents with  . COPD    sob unchanged pt is on Trelegy and duo neb 2-3 times daily as well as Ventolin prn.  . sleep disturbance    pts daughter thinks she needs to be evaluated for sleep apnea. She has heard her wake up  struggling to breath.    Synopsis: [name] is a 72 y.o. year old female with  history of dysphagia with solid & liqud, history of previous lobectomy,  normal pressure hydrocephalus s/p shunt placement on 12/09/16. She also has history of multiple TIAs and choking episodes when eating. She moved here recently from [place] to be with her daughter due to a diagnosis of dementia and decline in her function over the past year.  Patient  has severe emphysema with FEV1 of 45%.  Subjective: At last visit she was noted to be having an exacerbation, she was given a prescription for antibiotic and prednisone.  She was continued on Trelegy inhaler every morning she does duoneb about twice per day, and albuterol MDI 3 times per day. Breathing has been rough since the weather got hot. Her daughter is present and notes that her oxygen sats are low and she gasps for breath while sleeping.   She is on 2.5 L oxygen all times. She stopped smoking 08/2017. She went to pulm rehab for a little bit, stopped 2 months ago as she was getting read for a move. She is now moved and is considering going back to restart.    **chest x-ray 09/30/17; bibasilar atelectasis, possible pleural thickening and changes of chronic bronchitis. **Desat walk 09/30/17; baseline sat was 95% and HR 83. After walking 360 feet sat was 86% and HR 120, added oxygen at 2L and sat increased to 92%  Addendum 10/27/17; Outside records reviewed Spirometry 01/12/17 FVC equals 65% of predicted, 10% improvement with bronchodilator.  FEV1 equals 45% of predicted, 6% improvement with bronchodilator.  FEV FVC ratio is 53%.  Overall consistent with severe emphysema without significant improvement with bronchodilator. Office visit 01/12/17; notes made of previous lobectomy June of the year 2000.  Left femoral embolectomy 1998 noted that PFTs have been stable since approximately 2011 when her FEV1 was 50%.  She was given a trial of Trilogy inhaler with thought given to possible de-escalation in the future.  Medication:  Current Outpatient Medications:  .  albuterol (PROVENTIL HFA;VENTOLIN HFA) 108 (90 Base) MCG/ACT inhaler, TAKE 2 PUFFS BY MOUTH EVERY 4 HOURS AS NEEDED, Disp: 8.5 Inhaler, Rfl: 2 .  amLODipine (NORVASC) 5 MG tablet, Take 5 mg by mouth daily. , Disp: , Rfl:  .  amoxicillin-clavulanate (AUGMENTIN) 875-125 MG tablet, Take 1 tablet by mouth 2 (two) times daily., Disp: 20  tablet, Rfl: 0 .  busPIRone (BUSPAR) 7.5 MG tablet, Take 1 tablet by mouth twice daily for anxiety., Disp: 180 tablet, Rfl: 3 .  clopidogrel (PLAVIX) 75 MG tablet, TAKE 1 TABLET BY MOUTH EVERY DAY, Disp: 90 tablet, Rfl: 2 .  Fluticasone-Umeclidin-Vilant (TRELEGY ELLIPTA) 100-62.5-25 MCG/INH AEPB, Inhale 1 Inhaler into the lungs daily., Disp: 1 each, Rfl: 5 .  glipiZIDE (GLIPIZIDE XL) 5 MG 24 hr tablet, Take 1 tablet by mouth once daily with breakfast for diabetes., Disp: 90 tablet, Rfl: 1 .  ipratropium-albuterol (DUONEB) 0.5-2.5 (3) MG/3ML SOLN, Take 31mLs by nebulization every 6 (six) hours and prn. Dx:J44.9, Disp: 360 mL, Rfl: 5 .  losartan (COZAAR) 100 MG tablet, Take 1 tablet (100 mg total) by mouth daily., Disp: 90 tablet, Rfl: 3 .  magic mouthwash w/lidocaine SOLN, Take 5 mLs by mouth 4 (four) times daily as needed for mouth pain., Disp: 240 mL, Rfl: 0 .  Melatonin 5 MG TABS, Take 5 mg by mouth at bedtime. , Disp: , Rfl:  .  Multiple Vitamin (MULTIVITAMIN) capsule, Take 1 capsule by mouth daily., Disp: , Rfl:  .  Omega-3 Fatty Acids (FISH OIL PO), Take 520 mg by mouth daily. , Disp: , Rfl:  .  omeprazole (PRILOSEC) 20 MG capsule, Take 1 capsule (20 mg total) by mouth daily., Disp: 90 capsule, Rfl: 0 .  ONE TOUCH LANCETS MISC, 1 each by Other route 2 (two) times daily., Disp: 200 each, Rfl: 1 .  ONE TOUCH ULTRA TEST test strip, USE 1 TWICE A DAY AS INSTRUCTED, Disp: 100 each, Rfl: 5 .  OXYGEN, 2 L continuous. , Disp: , Rfl:  .  rosuvastatin (CRESTOR) 5 MG tablet, Take 1 tablet (5 mg total) by mouth every evening., Disp: 90 tablet, Rfl: 2 .  traZODone (DESYREL) 50 MG tablet, Take 1/2 to 1 tablet at bedtime for sleep., Disp: 90 tablet, Rfl: 0 .  venlafaxine XR (EFFEXOR-XR) 37.5 MG 24 hr capsule, TAKE 1 CAPSULE BY MOUTH ONCE DAILY WITH BREAKFAST FOR ANXIETY., Disp: 90 capsule, Rfl: 0   Allergies:  Lexapro [escitalopram oxalate] and Neosporin  [neomycin-polymyxin-gramicidin]  Review of  Systems: Gen:  Denies  fever, sweats, chills HEENT: Denies blurred vision, double vision. bleeds, sore throat Cvc:  No dizziness, chest pain. Resp:   Denies cough or sputum production, shortness of breath Gi: Denies swallowing difficulty, stomach pain. Gu:  Denies bladder incontinence, burning urine Ext:   No Joint pain, stiffness. Skin: No skin rash,  hives  Endoc:  No polyuria, polydipsia. Psych: No depression, insomnia. Other:  All other systems were reviewed with the patient and were negative other that what is mentioned in the HPI.   Physical Examination:   VS: BP (!) 118/48 (BP Location: Left Arm, Cuff Size: Normal)   Pulse 90   Resp 16   Ht 5\' 2"  (1.575 m)   Wt 114 lb (51.7 kg)   SpO2 94%   BMI 20.85 kg/m   General Appearance: No distress  Neuro:without focal findings,  speech normal,  HEENT: PERRLA, EOM intact.   Pulmonary: normal breath sounds, No wheezing.  Decreased air entry bilaterally.  CardiovascularNormal S1,S2.  No m/r/g.   Abdomen: Benign, Soft, non-tender. Renal:  No costovertebral tenderness  GU:  No performed at this time. Endoc: No evident thyromegaly, no signs of acromegaly. Skin:   warm, no rashes, no ecchymosis  Extremities: normal, no cyanosis, clubbing.  Other findings:    LABORATORY PANEL:   CBC No results for input(s): WBC, HGB, HCT, PLT in the last 168 hours. ------------------------------------------------------------------------------------------------------------------  Chemistries  No results for input(s): NA, K, CL, CO2, GLUCOSE, BUN, CREATININE, CALCIUM, MG, AST, ALT, ALKPHOS, BILITOT in the last 168 hours.  Invalid input(s): GFRCGP ------------------------------------------------------------------------------------------------------------------  Cardiac Enzymes No results for input(s): TROPONINI in the last 168 hours. ------------------------------------------------------------  RADIOLOGY:  No results found.     Thank   you for the consultation and for allowing [facility] to assist in the care of your patient. Our recommendations are noted above.  Please contact us if we can be of further service.   Marda Stalker, MD.  Board Certified in Internal Medicine, Pulmonary Medicine, [place], and Sleep Medicine.  [facility] Office Number: [phone]  Patricia Pesa, M.D.  Merton Border, M.D  04/28/2018

## 2018-04-30 ENCOUNTER — Encounter: Payer: Self-pay | Admitting: Dietician

## 2018-04-30 ENCOUNTER — Ambulatory Visit: Payer: Self-pay | Admitting: Dietician

## 2018-04-30 NOTE — Progress Notes (Signed)
Patient rescheduled her RD appointment from 04/30/18 to 05/21/18.

## 2018-05-10 ENCOUNTER — Telehealth: Payer: Self-pay | Admitting: Neurology

## 2018-05-10 NOTE — Telephone Encounter (Signed)
Patient's daughter has requested for Dr. Jannifer [place]' nurse to give her a call to discuss if her mother needs to return to see him yearly or not for a check-up. She states her mother has no new symptoms and doesn't need to follow-up with [facility]iv[facility]. She  would like to discuss maybe a yearly follow-up with Dr. Jannifer [facility] if her mother continues to stay the same.

## 2018-05-10 NOTE — Telephone Encounter (Signed)
If the patient is stable, okay for annual visit.

## 2018-05-11 NOTE — Telephone Encounter (Signed)
Called and LVM for daughter letting her know Dr. Jannifer [place] is okay with her mother f/u yearly if she is doing ok. Gave [facility] phone number for her to call back and make appt.   Can offer appt 11/19/18 if she calls

## 2018-05-12 ENCOUNTER — Ambulatory Visit: Payer: Medicare Other | Admitting: Adult Health

## 2018-05-12 NOTE — Telephone Encounter (Signed)
I called daughter again. She received my message but had not called back yet. Made appt for 11/19/18 at 12pm with Dr. Jannifer Franklin for her yearly f/u.

## 2018-05-13 ENCOUNTER — Telehealth: Payer: Self-pay | Admitting: Internal Medicine

## 2018-05-13 NOTE — Telephone Encounter (Signed)
[facility] is requesting notes from 10/18 to present to be able to recert pt oxygen. Fax [phone]

## 2018-05-13 NOTE — Telephone Encounter (Signed)
Documentation has been faxed.

## 2018-05-14 ENCOUNTER — Encounter: Payer: Self-pay | Admitting: Primary Care

## 2018-05-14 ENCOUNTER — Ambulatory Visit: Payer: Medicare Other | Admitting: Primary Care

## 2018-05-14 ENCOUNTER — Other Ambulatory Visit: Payer: Self-pay | Admitting: Primary Care

## 2018-05-14 VITALS — BP 128/66 | HR 88 | Temp 98.0°F | Ht 62.0 in | Wt 114.2 lb

## 2018-05-14 DIAGNOSIS — D649 Anemia, unspecified: Secondary | ICD-10-CM

## 2018-05-14 DIAGNOSIS — J449 Chronic obstructive pulmonary disease, unspecified: Secondary | ICD-10-CM

## 2018-05-14 DIAGNOSIS — F329 Major depressive disorder, single episode, unspecified: Secondary | ICD-10-CM

## 2018-05-14 DIAGNOSIS — F419 Anxiety disorder, unspecified: Secondary | ICD-10-CM

## 2018-05-14 MED ORDER — FLUTICASONE-UMECLIDIN-VILANT 100-62.5-25 MCG/INH IN AEPB: 1.0000 | INHALATION_SPRAY | Freq: Every day | RESPIRATORY_TRACT | 5 refills | Status: DC

## 2018-05-14 MED ORDER — ALBUTEROL SULFATE HFA 108 (90 BASE) MCG/ACT IN AERS: INHALATION_SPRAY | RESPIRATORY_TRACT | 2 refills | Status: DC

## 2018-05-14 NOTE — Assessment & Plan Note (Signed)
Repeat iron, ferritin, CBC today.

## 2018-05-14 NOTE — Patient Instructions (Addendum)
Increase your Trazodone to 1 full tablet at bedtime.  Follow up with pulmonology regarding the sleep study as scheduled.   Continue venlafaxine (Effexor) XR 37.5 mg capsules daily as discussed.   Call [phone] for an appointment with Bambi in our office.   It was a pleasure to see you today!

## 2018-05-14 NOTE — Progress Notes (Signed)
Subjective:    Patient ID: Amanda Porter, female    DOB: November 18, 1946, 72 y.o.   MRN: [id number]  HPI  Amanda Porter is a 72 year old female who presents today for follow up of anxiety.   She was last evaluated on 04/02/18 with reports of continued anxiety and depression despite treatment with Buspar. She couldn't tolerate numerous other medications that were offered in the past so she was initiated on low dose Effexor.   Since her last visit she's not noticed any improvement in her "nerves". Her daughter is getting her set up for sleep study as she's witnessed periods of apnea with her mother. She thinks this may be contributing to her morning anxiety and inability to sleep during the night.   Her daughter has noticed slight improvement in anxiety as her mother hasn't "complained about her nerves". Her daughter has noticed some increase in anxiety over the last week as she's having some financial difficulties, but overall thinks she's doing better. She was meeting with a therapist but her therapist moved to another location. She is sleeping better during the night with trazodone.  Review of Systems  Constitutional: Negative for fatigue.  Skin: Negative for color change.  Psychiatric/Behavioral: The patient is nervous/anxious.        See HPI       Past Medical History:  Diagnosis Date  . Anxiety   . Anxiety and depression   . COPD (chronic obstructive pulmonary disease) ([place])   . CVA (cerebral vascular accident) ([place])   . Dementia   . Depression   . Diabetes mellitus without complication ([place])   . Dysphasia   . History of lung cancer   . Hyperlipidemia   . Memory disturbance 11/20/2017  . Normal pressure hydrocephalus 2018  . Retinal detachment    Right  . Right carotid bruit 11/20/2017  . Thrombosis    Arterial to lower extremity?     Social History   Socioeconomic History  . Marital status: Divorced    Spouse name: Not on file  . Number of children: 1  . Years  of education: Not on file  . Highest education level: Not on file  Occupational History  . Occupation: Retired  Scientific laboratory technician  . Financial resource strain: Not on file  . Food insecurity:    Worry: Not on file    Inability: Not on file  . Transportation needs:    Medical: Not on file    Non-medical: Not on file  Tobacco Use  . Smoking status: Former Smoker    Packs/day: 0.50    Years: 50.00    Pack years: 25.00    Last attempt to quit: 08/01/2017    Years since quitting: 0.7  . Smokeless tobacco: Never Used  Substance and Sexual Activity  . Alcohol use: No  . Drug use: No  . Sexual activity: Not on file  Lifestyle  . Physical activity:    Days per week: Not on file    Minutes per session: Not on file  . Stress: Not on file  Relationships  . Social connections:    Talks on phone: Not on file    Gets together: Not on file    Attends religious service: Not on file    Active member of club or organization: Not on file    Attends meetings of clubs or organizations: Not on file    Relationship status: Not on file  . Intimate partner violence:    Fear of  current or ex partner: Not on file    Emotionally abused: Not on file    Physically abused: Not on file    Forced sexual activity: Not on file  Other Topics Concern  . Not on file  Social History Narrative   Moved from [place].   Lives with daughter.   Right handed    Past Surgical History:  Procedure Laterality Date  . LUNG REMOVAL, PARTIAL     left upper lobe  . VENTRICULOPERITONEAL SHUNT  12/09/2016    Family History  Problem Relation Age of Onset  . Pneumonia Mother   . Cancer Father   . Diabetes Sister   . Diabetes Sister     Allergies  Allergen Reactions  . Lexapro [Escitalopram Oxalate] Other (See Comments)    Dizziness, nausea, fatigue  . Neosporin  [Neomycin-Polymyxin-Gramicidin] Rash    (Neosporin)    Current Outpatient Medications on File Prior to Visit  Medication Sig Dispense Refill  .  busPIRone (BUSPAR) 7.5 MG tablet Take 1 tablet by mouth twice daily for anxiety. 180 tablet 3  . clopidogrel (PLAVIX) 75 MG tablet TAKE 1 TABLET BY MOUTH EVERY DAY 90 tablet 2  . Ferrous Sulfate (IRON) 325 (65 Fe) MG TABS Take 325 mg by mouth daily.    Marland Kitchen glipiZIDE (GLIPIZIDE XL) 5 MG 24 hr tablet Take 1 tablet by mouth once daily with breakfast for diabetes. 90 tablet 1  . ipratropium-albuterol (DUONEB) 0.5-2.5 (3) MG/3ML SOLN Take 60mLs by nebulization every 6 (six) hours and prn. Dx:J44.9 360 mL 5  . losartan (COZAAR) 100 MG tablet Take 1 tablet (100 mg total) by mouth daily. 90 tablet 3  . magic mouthwash w/lidocaine SOLN Take 5 mLs by mouth 4 (four) times daily as needed for mouth pain. 240 mL 0  . Melatonin 5 MG TABS Take 5 mg by mouth at bedtime.     . Multiple Vitamin (MULTIVITAMIN) capsule Take 1 capsule by mouth daily.    . ONE TOUCH LANCETS MISC 1 each by Other route 2 (two) times daily. 200 each 1  . ONE TOUCH ULTRA TEST test strip USE 1 TWICE A DAY AS INSTRUCTED 100 each 5  . OXYGEN 2 L continuous.     . rosuvastatin (CRESTOR) 5 MG tablet Take 1 tablet (5 mg total) by mouth every evening. 90 tablet 2  . traZODone (DESYREL) 50 MG tablet Take 1/2 to 1 tablet at bedtime for sleep. 90 tablet 0  . venlafaxine XR (EFFEXOR-XR) 37.5 MG 24 hr capsule TAKE 1 CAPSULE BY MOUTH ONCE DAILY WITH BREAKFAST FOR ANXIETY. 90 capsule 0   No current facility-administered medications on file prior to visit.     BP 128/66   Pulse 88   Temp 98 F (36.7 C) (Oral)   Ht 5\' 2"  (1.575 m)   Wt 114 lb 4 oz (51.8 kg)   SpO2 97%   BMI 20.90 kg/m    Objective:   Physical Exam  Constitutional: She appears well-nourished.  Cardiovascular: Normal rate and regular rhythm.  Respiratory: Effort normal and breath sounds normal. She has no wheezes.  Skin: Skin is warm and dry.  Psychiatric: She has a normal mood and affect.           Assessment & Plan:

## 2018-05-14 NOTE — Telephone Encounter (Signed)
Patient needed a refill albuterol inhaler and Trelegy Ellipta. Amanda Porter recommend this request to be sent to Pulmonology.

## 2018-05-14 NOTE — Assessment & Plan Note (Signed)
Seems slightly improved with Effexor. Agree for sleep study as this could be contributing to night time insomnia. Continue Trazodone as needed for difficulty falling back asleep.  Continue Effexor at current dose as she does experience some dizziness in the morning. Discussed that her therapist is still in our office on Tuesdays, she will call to schedule another appointment.   Her daughter will update [place] on the status of her sleep study. Continue current regimen for now.

## 2018-05-15 LAB — CBC
HCT: 32.2 % — ABNORMAL LOW (ref 35.0–45.0)
Hemoglobin: 10.9 g/dL — ABNORMAL LOW (ref 11.7–15.5)
MCH: 29.5 pg (ref 27.0–33.0)
MCHC: 33.9 g/dL (ref 32.0–36.0)
MCV: 87.3 fL (ref 80.0–100.0)
MPV: 10.1 fL (ref 7.5–12.5)
PLATELETS: 328 10*3/uL (ref 140–400)
RBC: 3.69 10*6/uL — AB (ref 3.80–5.10)
RDW: 15.6 % — ABNORMAL HIGH (ref 11.0–15.0)
WBC: 7.3 10*3/uL (ref 3.8–10.8)

## 2018-05-15 LAB — FERRITIN: FERRITIN: 24 ng/mL (ref 20–288)

## 2018-05-15 LAB — IRON: Iron: 27 ug/dL — ABNORMAL LOW (ref 45–160)

## 2018-05-16 ENCOUNTER — Encounter: Payer: Self-pay | Admitting: Primary Care

## 2018-05-16 DIAGNOSIS — D649 Anemia, unspecified: Secondary | ICD-10-CM

## 2018-05-21 ENCOUNTER — Encounter: Payer: Medicare Other | Attending: Primary Care | Admitting: Dietician

## 2018-05-21 ENCOUNTER — Encounter: Payer: Self-pay | Admitting: Dietician

## 2018-05-21 VITALS — BP 118/58 | Ht 62.0 in | Wt 111.8 lb

## 2018-05-21 DIAGNOSIS — J449 Chronic obstructive pulmonary disease, unspecified: Secondary | ICD-10-CM | POA: Insufficient documentation

## 2018-05-21 DIAGNOSIS — E119 Type 2 diabetes mellitus without complications: Secondary | ICD-10-CM

## 2018-05-21 DIAGNOSIS — Z79899 Other long term (current) drug therapy: Secondary | ICD-10-CM | POA: Diagnosis not present

## 2018-05-21 DIAGNOSIS — F418 Other specified anxiety disorders: Secondary | ICD-10-CM | POA: Insufficient documentation

## 2018-05-21 DIAGNOSIS — Z87891 Personal history of nicotine dependence: Secondary | ICD-10-CM | POA: Insufficient documentation

## 2018-05-21 DIAGNOSIS — Z8673 Personal history of transient ischemic attack (TIA), and cerebral infarction without residual deficits: Secondary | ICD-10-CM | POA: Insufficient documentation

## 2018-05-21 DIAGNOSIS — Z7902 Long term (current) use of antithrombotics/antiplatelets: Secondary | ICD-10-CM | POA: Diagnosis not present

## 2018-05-21 DIAGNOSIS — Z7984 Long term (current) use of oral hypoglycemic drugs: Secondary | ICD-10-CM | POA: Insufficient documentation

## 2018-05-21 DIAGNOSIS — Z85118 Personal history of other malignant neoplasm of bronchus and lung: Secondary | ICD-10-CM | POA: Insufficient documentation

## 2018-05-21 NOTE — Patient Instructions (Signed)
   Eat some protein along with carbs with each meal. Eat as much as able, then wait 2-3 hours before eating a snack.   For a bedtime snack include some carb such as fruit along with protein such as yogurt, or Atkins shake.   Include good sources of iron such as sirloin or round steak, other meats, blackstrap molasses, or others on the provided list.

## 2018-05-21 NOTE — Progress Notes (Signed)
Diabetes Self-Management Education  Visit Type:  Follow-up  Appt. Start Time: 0900 Appt. End Time: 1000  05/21/2018  [name], identified by name and date of birth, is a 72 y.o. female with a diagnosis of Diabetes:  .   ASSESSMENT  Blood pressure (!) 118/58, height 5\' 2"  (1.575 m), weight 111 lb 12.8 oz (50.7 kg). Body mass index is 20.45 kg/m.   Diabetes Self-Management Education - 70/35/00 9381      Complications   How often do you check your blood sugar?  1-2 times/day    Fasting Blood glucose range (mg/dL)  70-129 110-130    Postprandial Blood glucose range (mg/dL)  70-129;130-179 110-141    Have you had a dilated eye exam in the past 12 months?  Yes    Have you had a dental exam in the past 12 months?  Yes dentures    Are you checking your feet?  Yes    How many days per week are you checking your feet?  7      Dietary Intake   Breakfast  3 small meals and 2 snacks daily    Beverage(s)  water, stopped sodas, some Atkins and high protein Ensure drinks (for snacks)      Exercise   Exercise Type  ADL's will resume pulmonary rehab 05/2018      Patient Education   Nutrition management   Role of diet in the treatment of diabetes and the relationship between the three main macronutrients and blood glucose level;Meal timing in regards to the patients' current diabetes medication.;Meal options for control of blood glucose level and chronic complications.;Other (comment) basic meal planning using menus, per patient request    Monitoring  Taught/discussed recording of test results and interpretation of SMBG.      Post-Education Assessment   Patient understands the diabetes disease and treatment process.  Demonstrates understanding / competency    Patient understands incorporating nutritional management into lifestyle.  Demonstrates understanding / competency    Patient undertands incorporating physical activity into lifestyle.  Demonstrates understanding / competency    Patient understands using medications safely.  Demonstrates understanding / competency    Patient understands monitoring blood glucose, interpreting and using results  Demonstrates understanding / competency    Patient understands prevention, detection, and treatment of acute complications.  Needs Review    Patient understands prevention, detection, and treatment of chronic complications.  Demonstrates understanding / competency    Patient understands how to develop strategies to address psychosocial issues.  Needs Review    Patient understands how to develop strategies to promote health/change behavior.  Demonstrates understanding / competency      Outcomes   Program Status  Completed       Learning Objective:  Patient will have a greater understanding of diabetes self-management. Patient education plan is to attend individual and/or group sessions per assessed needs and concerns.  Patient has lost 5lbs since 03/26/18; she and her daughter feel it is due to avoiding sodas, and grazing pattern of eating rather than structured meal and snack times. Instructed on basic meal planning based on patient's food preferences. Also discussed sources of iron as daughter stated that patient's hemoglobin is low.    Plan:   Patient Instructions   Eat some protein along with carbs with each meal. Eat as much as able, then wait 2-3 hours before eating a snack.   For a bedtime snack include some carb such as fruit along with protein such as yogurt, or  Atkins shake.   Include good sources of iron such as sirloin or round steak, other meats, blackstrap molasses, or others on the provided list.     Expected Outcomes:  Demonstrated interest in learning. Expect positive outcomes  Education material provided: Quick and Easy Meal Ideas; Iron Deficiency (AND); personalized menus  If problems or questions, patient to contact team via:  Phone and Email  Future DSME appointment: -  prn

## 2018-05-24 ENCOUNTER — Other Ambulatory Visit: Payer: Self-pay | Admitting: Primary Care

## 2018-05-24 DIAGNOSIS — E785 Hyperlipidemia, unspecified: Secondary | ICD-10-CM

## 2018-05-25 ENCOUNTER — Ambulatory Visit: Payer: Medicare Other | Admitting: Adult Health

## 2018-05-26 ENCOUNTER — Ambulatory Visit: Payer: Medicare Other | Attending: Internal Medicine

## 2018-05-26 DIAGNOSIS — J439 Emphysema, unspecified: Secondary | ICD-10-CM | POA: Diagnosis present

## 2018-05-26 DIAGNOSIS — G4710 Hypersomnia, unspecified: Secondary | ICD-10-CM | POA: Diagnosis not present

## 2018-05-26 DIAGNOSIS — G4730 Sleep apnea, unspecified: Secondary | ICD-10-CM | POA: Diagnosis not present

## 2018-05-28 DIAGNOSIS — G4733 Obstructive sleep apnea (adult) (pediatric): Secondary | ICD-10-CM | POA: Diagnosis not present

## 2018-05-31 ENCOUNTER — Encounter: Payer: Self-pay | Admitting: Primary Care

## 2018-06-01 ENCOUNTER — Telehealth: Payer: Self-pay | Admitting: *Deleted

## 2018-06-01 DIAGNOSIS — G4719 Other hypersomnia: Secondary | ICD-10-CM

## 2018-06-01 NOTE — Telephone Encounter (Signed)
Could consider an anti-snoring device such as (Zyppah or PureSleep), should also avoid sleeping in the supine position.

## 2018-06-01 NOTE — Telephone Encounter (Signed)
Called patient's daughter and went over results of sleep study. osa negative Mild postional sleep apnea seen in supine positon AHI 6  Pt daughter wants to know what the next step is?

## 2018-06-02 ENCOUNTER — Encounter: Payer: Self-pay | Admitting: *Deleted

## 2018-06-02 NOTE — Telephone Encounter (Signed)
Ok to do HST if covered by insurance.

## 2018-06-02 NOTE — Telephone Encounter (Signed)
Called patient's daughter and made aware. Inquired re: Pulm Rehab in which was scheduled for orientation that was cancelled.  Pts is anemic at time and working with pcp to get iron back up. She is being treated for anxiety by pcp and started on Effexor a month ago. Pts daughter was wanting to know if patient could do a HST?

## 2018-06-02 NOTE — Addendum Note (Signed)
Addended by: Stephanie Coup on: 06/02/2018 02:17 PM   Modules accepted: Orders

## 2018-06-21 ENCOUNTER — Other Ambulatory Visit: Payer: Self-pay | Admitting: *Deleted

## 2018-06-21 ENCOUNTER — Ambulatory Visit: Payer: Self-pay

## 2018-06-21 DIAGNOSIS — J449 Chronic obstructive pulmonary disease, unspecified: Secondary | ICD-10-CM

## 2018-06-22 ENCOUNTER — Encounter: Payer: Self-pay | Admitting: Primary Care

## 2018-06-22 ENCOUNTER — Encounter: Payer: Self-pay | Admitting: Internal Medicine

## 2018-06-22 ENCOUNTER — Other Ambulatory Visit (INDEPENDENT_AMBULATORY_CARE_PROVIDER_SITE_OTHER): Payer: Medicare Other

## 2018-06-22 DIAGNOSIS — D649 Anemia, unspecified: Secondary | ICD-10-CM | POA: Diagnosis not present

## 2018-06-22 DIAGNOSIS — G4733 Obstructive sleep apnea (adult) (pediatric): Secondary | ICD-10-CM

## 2018-06-22 DIAGNOSIS — D509 Iron deficiency anemia, unspecified: Secondary | ICD-10-CM

## 2018-06-22 DIAGNOSIS — G4719 Other hypersomnia: Secondary | ICD-10-CM

## 2018-06-22 LAB — CBC
HCT: 34 % — ABNORMAL LOW (ref 36.0–46.0)
Hemoglobin: 11 g/dL — ABNORMAL LOW (ref 12.0–15.0)
MCHC: 32.2 g/dL (ref 30.0–36.0)
MCV: 91.8 fl (ref 78.0–100.0)
Platelets: 311 10*3/uL (ref 150.0–400.0)
RBC: 3.71 Mil/uL — ABNORMAL LOW (ref 3.87–5.11)
RDW: 17 % — AB (ref 11.5–15.5)
WBC: 8.1 10*3/uL (ref 4.0–10.5)

## 2018-06-22 LAB — IBC PANEL
Iron: 19 ug/dL — ABNORMAL LOW (ref 42–145)
SATURATION RATIOS: 4.8 % — AB (ref 20.0–50.0)
Transferrin: 283 mg/dL (ref 212.0–360.0)

## 2018-06-23 ENCOUNTER — Encounter: Payer: Self-pay | Admitting: Primary Care

## 2018-06-23 DIAGNOSIS — D649 Anemia, unspecified: Secondary | ICD-10-CM

## 2018-06-24 ENCOUNTER — Telehealth: Payer: Self-pay | Admitting: *Deleted

## 2018-06-24 DIAGNOSIS — J449 Chronic obstructive pulmonary disease, unspecified: Secondary | ICD-10-CM

## 2018-06-24 NOTE — Telephone Encounter (Signed)
-----   Message from Laverle Hobby, MD sent at 06/23/2018  9:03 AM EDT ----- Regarding: RE: Pulmonary Rehab Contact: [phone] [name], can we send this patient for a PFT in order to qualify her for pulm rehab? ----- Message ----- From: Wyatt Portela Sent: 06/22/2018   2:19 PM To: Rowe Pavy, RN, Stephanie Coup, CMA, # Subject: Pulmonary Rehab                                Good Afternoon,  There is no PFT attached in patients chart. We cannot proceed with scheduling with Dx being COPD at this time. Is there a different Dx you can use to place a new order? If you have any questions, please let me know.   Thank you, Wyatt Portela [facility] Support Rep II

## 2018-06-25 ENCOUNTER — Telehealth: Payer: Self-pay | Admitting: *Deleted

## 2018-06-25 DIAGNOSIS — G478 Other sleep disorders: Secondary | ICD-10-CM

## 2018-06-25 NOTE — Telephone Encounter (Signed)
PFT ordered patient's daughter aware

## 2018-06-25 NOTE — Telephone Encounter (Signed)
Daughter informed pt was negative for sleep apnea but that she does need to continue her oxygen at 2.5L QHS. Nothing further needed.

## 2018-06-25 NOTE — Telephone Encounter (Signed)
Amanda Porter, will you take a look? Thanks!

## 2018-06-28 ENCOUNTER — Telehealth: Payer: Self-pay | Admitting: Oncology

## 2018-06-28 NOTE — Telephone Encounter (Signed)
New hematology referral received from [place] for anemia. Pt has been scheduled to see Dr. Alen Blew on 8/2 at 11am. Appt date and time has been given to the pt's daughter. Aware to arrive 30 minutes early.

## 2018-07-02 ENCOUNTER — Telehealth: Payer: Self-pay | Admitting: Oncology

## 2018-07-02 ENCOUNTER — Telehealth: Payer: Medicare Other | Admitting: Family

## 2018-07-02 ENCOUNTER — Encounter: Payer: Self-pay | Admitting: Primary Care

## 2018-07-02 ENCOUNTER — Inpatient Hospital Stay: Payer: Medicare Other | Attending: Oncology | Admitting: Oncology

## 2018-07-02 VITALS — BP 141/56 | HR 96 | Temp 98.4°F | Resp 17 | Ht 62.0 in | Wt 113.3 lb

## 2018-07-02 DIAGNOSIS — R710 Precipitous drop in hematocrit: Secondary | ICD-10-CM | POA: Insufficient documentation

## 2018-07-02 DIAGNOSIS — R5383 Other fatigue: Secondary | ICD-10-CM

## 2018-07-02 DIAGNOSIS — R42 Dizziness and giddiness: Secondary | ICD-10-CM | POA: Insufficient documentation

## 2018-07-02 DIAGNOSIS — D509 Iron deficiency anemia, unspecified: Secondary | ICD-10-CM | POA: Diagnosis not present

## 2018-07-02 DIAGNOSIS — J029 Acute pharyngitis, unspecified: Secondary | ICD-10-CM

## 2018-07-02 MED ORDER — PREDNISONE 5 MG PO TABS: 5.0000 mg | ORAL_TABLET | ORAL | 0 refills | Status: DC

## 2018-07-02 MED ORDER — BENZONATATE 100 MG PO CAPS: 100.0000 mg | ORAL_CAPSULE | Freq: Three times a day (TID) | ORAL | 0 refills | Status: DC | PRN

## 2018-07-02 NOTE — Telephone Encounter (Signed)
Appointments scheduled AVS/Calendar printed per 8/2 los

## 2018-07-02 NOTE — Progress Notes (Signed)
Thank you for the details you included in the comment boxes. Those details are very helpful in determining the best course of treatment for you and help [place] to provide the best care.  We are sorry that you are not feeling well.  Here is how we plan to help!  Based on your presentation I believe you most likely have A cough due to a virus.  This is called viral bronchitis and is best treated by rest, plenty of fluids and control of the cough.  You may use Ibuprofen or Tylenol as directed to help your symptoms.     In addition you may use A non-prescription cough medication called Mucinex DM: take 2 tablets every 12 hours. and A prescription cough medication called Tessalon Perles 100mg . You may take 1-2 capsules every 8 hours as needed for your cough.  Prednisone 5 mg daily for 6 days (see taper instructions below)  Directions for 6 day taper: Day 1: 2 tablets before breakfast, 1 after both lunch & dinner and 2 at bedtime Day 2: 1 tab before breakfast, 1 after both lunch & dinner and 2 at bedtime Day 3: 1 tab at each meal & 1 at bedtime Day 4: 1 tab at breakfast, 1 at lunch, 1 at bedtime Day 5: 1 tab at breakfast & 1 tab at bedtime Day 6: 1 tab at breakfast   From your responses in the eVisit questionnaire you describe inflammation in the upper respiratory tract which is causing a significant cough.  This is commonly called Bronchitis and has four common causes:    Allergies  Viral Infections  Acid Reflux  Bacterial Infection Allergies, viruses and acid reflux are treated by controlling symptoms or eliminating the cause. An example might be a cough caused by taking certain blood pressure medications. You stop the cough by changing the medication. Another example might be a cough caused by acid reflux. Controlling the reflux helps control the cough.  USE OF BRONCHODILATOR ("RESCUE") INHALERS: There is a risk from using your bronchodilator too frequently.  The risk is that over-reliance on  a medication which only relaxes the muscles surrounding the breathing tubes can reduce the effectiveness of medications prescribed to reduce swelling and congestion of the tubes themselves.  Although you feel brief relief from the bronchodilator inhaler, your asthma may actually be worsening with the tubes becoming more swollen and filled with mucus.  This can delay other crucial treatments, such as oral steroid medications. If you need to use a bronchodilator inhaler daily, several times per day, you should discuss this with your provider.  There are probably better treatments that could be used to keep your asthma under control.     HOME CARE . Only take medications as instructed by your medical team. . Complete the entire course of an antibiotic. . Drink plenty of fluids and get plenty of rest. . Avoid close contacts especially the very young and the elderly . Cover your mouth if you cough or cough into your sleeve. . Always remember to wash your hands . A steam or ultrasonic humidifier can help congestion.   GET HELP RIGHT AWAY IF: . You develop worsening fever. . You become short of breath . You cough up blood. . Your symptoms persist after you have completed your treatment plan MAKE SURE YOU   Understand these instructions.  Will watch your condition.  Will get help right away if you are not doing well or get worse.  Your e-visit answers were reviewed  by a board certified advanced clinical practitioner to complete your personal care plan.  Depending on the condition, your plan could have included both over the counter or prescription medications. If there is a problem please reply  once you have received a response from your provider. Your safety is important to [place].  If you have drug allergies check your prescription carefully.    You can use MyChart to ask questions about today's visit, request a non-urgent call back, or ask for a work or school excuse for 24 hours related to this  e-Visit. If it has been greater than 24 hours you will need to follow up with your provider, or enter a new e-Visit to address those concerns. You will get an e-mail in the next two days asking about your experience.  I hope that your e-visit has been valuable and will speed your recovery. Thank you for using e-visits.

## 2018-07-02 NOTE — Progress Notes (Signed)
Reason for the request:    Anemia  HPI: I was asked by Loma Boston, NP  to evaluate Amanda Porter for iron deficiency anemia.  She is a 72 year old woman with history of diabetes, hyperlipidemia as well as dementia with recent diagnosis of anemia.  April 2019 her hemoglobin was 9.8 and it was down to 9.1 in May 2019.  Her MCV was normal at 88 with a white cell count of 11 and platelet count of 368.  Iron studies in May 2019 showed showed a total iron level of 49, saturation of 9.8.  Repeat iron studies in June 2019 showed iron declined to 27 and ferritin of 24.  On June 22, 2018 her iron level was 19 with saturation of 4.8%.  She has been on oral iron replacement prescribed by her primary care provider during that time.  Her last colonoscopy was done in 2018 and was unremarkable.   Clinically, she does report fatigue and dizziness.  She denies any hematochezia or melena.  She does report dyspnea on exertion and she is oxygen dependent at this time.  She has tolerated oral iron although she does report constipation and occasional dyspepsia.  Her iron studies have drifted lower despite being on oral iron supplements in the last month.  She does not report any headaches, blurry vision, syncope or seizures. Does not report any fevers, chills or sweats.  Does not report any cough, wheezing or hemoptysis.  Does not report any chest pain, palpitation, orthopnea or leg edema.  Does not report any nausea, vomiting or abdominal pain.  Does not report any constipation or diarrhea.  Does not report any skeletal complaints.    Does not report frequency, urgency or hematuria.  Does not report any skin rashes or lesions. Does not report any heat or cold intolerance.  Does not report any lymphadenopathy or petechiae.  Does not report any anxiety or depression.  Remaining review of systems is negative.    Past Medical History:  Diagnosis Date  . Anxiety   . Anxiety and depression   . COPD (chronic obstructive  pulmonary disease) ([place])   . CVA (cerebral vascular accident) ([place])   . Dementia   . Depression   . Diabetes mellitus without complication ([place])   . Dysphasia   . History of lung cancer   . Hyperlipidemia   . Memory disturbance 11/20/2017  . Normal pressure hydrocephalus 2018  . Retinal detachment    Right  . Right carotid bruit 11/20/2017  . Thrombosis    Arterial to lower extremity?  :  Past Surgical History:  Procedure Laterality Date  . LUNG REMOVAL, PARTIAL     left upper lobe  . VENTRICULOPERITONEAL SHUNT  12/09/2016  :   Current Outpatient Medications:  .  albuterol (PROVENTIL HFA;VENTOLIN HFA) 108 (90 Base) MCG/ACT inhaler, TAKE 2 PUFFS BY MOUTH EVERY 4 HOURS AS NEEDED, Disp: 8.5 Inhaler, Rfl: 2 .  busPIRone (BUSPAR) 7.5 MG tablet, Take 1 tablet by mouth twice daily for anxiety., Disp: 180 tablet, Rfl: 3 .  clopidogrel (PLAVIX) 75 MG tablet, TAKE 1 TABLET BY MOUTH EVERY DAY, Disp: 90 tablet, Rfl: 2 .  Ferrous Sulfate (IRON) 325 (65 Fe) MG TABS, Take 325 mg by mouth daily., Disp: , Rfl:  .  Fluticasone-Umeclidin-Vilant (TRELEGY ELLIPTA) 100-62.5-25 MCG/INH AEPB, Inhale 1 Inhaler into the lungs daily., Disp: 1 each, Rfl: 5 .  glipiZIDE (GLIPIZIDE XL) 5 MG 24 hr tablet, Take 1 tablet by mouth once daily with breakfast for diabetes.,  Disp: 90 tablet, Rfl: 1 .  ipratropium-albuterol (DUONEB) 0.5-2.5 (3) MG/3ML SOLN, Take 29mLs by nebulization every 6 (six) hours and prn. Dx:J44.9, Disp: 360 mL, Rfl: 5 .  losartan (COZAAR) 100 MG tablet, Take 1 tablet (100 mg total) by mouth daily., Disp: 90 tablet, Rfl: 3 .  magic mouthwash w/lidocaine SOLN, Take 5 mLs by mouth 4 (four) times daily as needed for mouth pain., Disp: 240 mL, Rfl: 0 .  Melatonin 5 MG TABS, Take 5 mg by mouth at bedtime. , Disp: , Rfl:  .  Multiple Vitamin (MULTIVITAMIN) capsule, Take 1 capsule by mouth daily., Disp: , Rfl:  .  ONE TOUCH LANCETS MISC, 1 each by Other route 2 (two) times daily., Disp: 200 each, Rfl:  1 .  ONE TOUCH ULTRA TEST test strip, USE 1 TWICE A DAY AS INSTRUCTED, Disp: 100 each, Rfl: 5 .  OXYGEN, 2 L continuous. , Disp: , Rfl:  .  rosuvastatin (CRESTOR) 5 MG tablet, TAKE 1 TABLET BY MOUTH EVERY DAY IN THE EVENING, Disp: 90 tablet, Rfl: 1 .  traZODone (DESYREL) 50 MG tablet, Take 1/2 to 1 tablet at bedtime for sleep., Disp: 90 tablet, Rfl: 0 .  venlafaxine XR (EFFEXOR-XR) 37.5 MG 24 hr capsule, TAKE 1 CAPSULE BY MOUTH ONCE DAILY WITH BREAKFAST FOR ANXIETY., Disp: 90 capsule, Rfl: 0:  Allergies  Allergen Reactions  . Lexapro [Escitalopram Oxalate] Other (See Comments)    Dizziness, nausea, fatigue  . Neosporin  [Neomycin-Polymyxin-Gramicidin] Rash    (Neosporin)  :  Family History  Problem Relation Age of Onset  . Pneumonia Mother   . Cancer Father   . Diabetes Sister   . Diabetes Sister   :  Social History   Socioeconomic History  . Marital status: Divorced    Spouse name: Not on file  . Number of children: 1  . Years of education: Not on file  . Highest education level: Not on file  Occupational History  . Occupation: Retired  Scientific laboratory technician  . Financial resource strain: Not on file  . Food insecurity:    Worry: Not on file    Inability: Not on file  . Transportation needs:    Medical: Not on file    Non-medical: Not on file  Tobacco Use  . Smoking status: Former Smoker    Packs/day: 0.50    Years: 50.00    Pack years: 25.00    Last attempt to quit: 08/01/2017    Years since quitting: 0.9  . Smokeless tobacco: Never Used  Substance and Sexual Activity  . Alcohol use: No  . Drug use: No  . Sexual activity: Not on file  Lifestyle  . Physical activity:    Days per week: Not on file    Minutes per session: Not on file  . Stress: Not on file  Relationships  . Social connections:    Talks on phone: Not on file    Gets together: Not on file    Attends religious service: Not on file    Active member of club or organization: Not on file    Attends  meetings of clubs or organizations: Not on file    Relationship status: Not on file  . Intimate partner violence:    Fear of current or ex partner: Not on file    Emotionally abused: Not on file    Physically abused: Not on file    Forced sexual activity: Not on file  Other Topics Concern  . Not  on file  Social History Narrative   Moved from [place].   Lives with daughter.   Right handed  :  Pertinent items are noted in HPI.  Exam: Blood pressure (!) 141/56, pulse 96, temperature 98.4 F (36.9 C), temperature source Oral, resp. rate 17, height 5\' 2"  (1.575 m), weight 113 lb 4.8 oz (51.4 kg), SpO2 93 %.   ECOG 1 General appearance: alert and cooperative appeared without distress. Head: atraumatic without any abnormalities. Eyes: conjunctivae/corneas clear. PERRL.  Sclera anicteric. Throat: lips, mucosa, and tongue normal; without oral thrush or ulcers. Resp: clear to auscultation bilaterally without rhonchi, wheezes or dullness to percussion. Cardio: regular rate and rhythm, S1, S2 normal, no murmur, click, rub or gallop GI: soft, non-tender; bowel sounds normal; no masses,  no organomegaly Skin: Skin color, texture, turgor normal. No rashes or lesions Lymph nodes: Cervical, supraclavicular, and axillary nodes normal. Neurologic: Grossly normal without any motor, sensory or deep tendon reflexes. Musculoskeletal: No joint deformity or effusion.  CBC    Component Value Date/Time   WBC 8.1 06/22/2018 1459   RBC 3.71 (L) 06/22/2018 1459   HGB 11.0 (L) 06/22/2018 1459   HCT 34.0 (L) 06/22/2018 1459   PLT 311.0 06/22/2018 1459   MCV 91.8 06/22/2018 1459   MCH 29.5 05/14/2018 1607   MCHC 32.2 06/22/2018 1459   RDW 17.0 (H) 06/22/2018 1459     Chemistry      Component Value Date/Time   NA 141 04/02/2018 0928   K 4.3 04/02/2018 0928   CL 105 04/02/2018 0928   CO2 27 04/02/2018 0928   BUN 17 04/02/2018 0928   CREATININE 0.78 04/02/2018 0928      Component Value Date/Time    CALCIUM 9.6 04/02/2018 0928   ALKPHOS 61 09/15/2017 0815   AST 15 09/15/2017 0815   ALT 12 09/15/2017 0815   BILITOT 0.2 09/15/2017 0815         Assessment and Plan:   72 year old woman with the following:  1.  Iron deficiency anemia documented in May 2019.  At that time her hemoglobin was 9.1 with iron saturation of 9.8.  And over the last 2 months her iron drifted down to 19 with a ferritin of 24.  Her hemoglobin did improve up to 11.0 with iron supplements orally.  The differential diagnosis and the natural course of these findings were reviewed today with the patient.  Etiology of her anemia is predominantly related to iron deficiency is likely related to poor absorption of oral iron.  Her vitamin B12 levels are within normal range is her electrolytes and kidney function.  Other causes of anemia considered less likely.  From a management standpoint, options of therapy were reviewed.  These would include continuing oral iron therapy at this time and periodic monitoring of her iron studies moving forward.  Alternatively, intravenous iron replacement would be another option to adequately replace her iron without encountering absorptive issues of her oral iron.  Complication associated with Feraheme infusion were reviewed today.  These include arthralgias, myalgias and rarely infusion related reaction anaphylaxis.  After discussion today, she elected to proceed with intravenous iron.  She will receive 510 mg intravenously next week and will be repeated 2 weeks after that.  The schedule was determined by her daughter's work schedule and she prefers to be with her during those times.  We will repeat iron studies in 4 months to ensure adequacy of deficiency resolution.   2.  Age-appropriate cancer screening: She is up-to-date  and her last colonoscopy was within the last year without any issues to suggest malignancy.   3.  Dizziness and fatigue: Anemia is certainly contributing to these  complaints but I believe they are mostly multifactorial.  Correcting her anemia might improve the symptoms but I do not anticipate complete resolution.  4.  Follow-up: We will be in 4 months to follow her progress.  30  minutes was spent with the patient face-to-face today.  More than 50% of time was dedicated to discussing the natural course of the disease and counseling her about treatment options and complication associated with treatment.   Thank you for the referral.   A copy of this consult has been forwarded to the requesting physician.

## 2018-07-05 ENCOUNTER — Inpatient Hospital Stay: Payer: Medicare Other

## 2018-07-05 VITALS — BP 117/76 | HR 78 | Temp 98.1°F | Resp 18

## 2018-07-05 DIAGNOSIS — D509 Iron deficiency anemia, unspecified: Secondary | ICD-10-CM | POA: Diagnosis not present

## 2018-07-05 MED ORDER — FERUMOXYTOL INJECTION 510 MG/17 ML
510.0000 mg | Freq: Once | INTRAVENOUS | Status: AC
  Administered 2018-07-05: 510 mg via INTRAVENOUS
  Filled 2018-07-05: qty 17

## 2018-07-05 MED ORDER — SODIUM CHLORIDE 0.9 % IV SOLN
Freq: Once | INTRAVENOUS | Status: AC
  Administered 2018-07-05: 08:00:00 via INTRAVENOUS
  Filled 2018-07-05: qty 250

## 2018-07-05 NOTE — Patient Instructions (Signed)

## 2018-07-15 ENCOUNTER — Other Ambulatory Visit: Payer: Self-pay | Admitting: Primary Care

## 2018-07-15 DIAGNOSIS — G4700 Insomnia, unspecified: Secondary | ICD-10-CM

## 2018-07-21 ENCOUNTER — Other Ambulatory Visit: Payer: Self-pay | Admitting: Primary Care

## 2018-07-21 DIAGNOSIS — F329 Major depressive disorder, single episode, unspecified: Secondary | ICD-10-CM

## 2018-07-21 DIAGNOSIS — F419 Anxiety disorder, unspecified: Principal | ICD-10-CM

## 2018-07-22 ENCOUNTER — Inpatient Hospital Stay: Payer: Medicare Other

## 2018-07-22 VITALS — BP 139/64 | HR 85 | Temp 97.8°F | Resp 18

## 2018-07-22 DIAGNOSIS — D509 Iron deficiency anemia, unspecified: Secondary | ICD-10-CM

## 2018-07-22 MED ORDER — SODIUM CHLORIDE 0.9 % IV SOLN
510.0000 mg | Freq: Once | INTRAVENOUS | Status: AC
  Administered 2018-07-22: 510 mg via INTRAVENOUS
  Filled 2018-07-22: qty 17

## 2018-07-22 MED ORDER — SODIUM CHLORIDE 0.9 % IV SOLN
Freq: Once | INTRAVENOUS | Status: AC
  Administered 2018-07-22: 09:00:00 via INTRAVENOUS
  Filled 2018-07-22: qty 250

## 2018-07-22 NOTE — Patient Instructions (Signed)

## 2018-07-23 ENCOUNTER — Ambulatory Visit: Payer: Self-pay | Admitting: Internal Medicine

## 2018-07-26 ENCOUNTER — Telehealth: Payer: Self-pay | Admitting: Neurology

## 2018-07-26 NOTE — Telephone Encounter (Signed)
I called the daughter.  The patient has had headaches over the last month or 2.  It seemed to correlate with when she started the Effexor.  The Effexor has helped her anxiety issues, however.  I will get a visit to reevaluate her, we may need to check another CT scan of the brain.  The patient has been on iron infusions for a mild anemia.  I have recommended stopping the Effexor for 2 or 3 weeks to see if the headache goes away.

## 2018-07-26 NOTE — Telephone Encounter (Signed)
[name] returned Dr Viona Gilmore call she said

## 2018-07-26 NOTE — Telephone Encounter (Signed)
Patient's daughter [name] (on [place]) states patient has had a consistent headache for a month. She wonders if shunt is working. Please call and discuss.

## 2018-07-27 NOTE — Telephone Encounter (Signed)
Called, LVM for pt daughter, [name] to call and schedule appt for her mother to see Dr. Jannifer Franklin.  Ok to offer 07/29/18 at 9am. Appt on hold

## 2018-07-27 NOTE — Telephone Encounter (Signed)
Daughter called and spoke with phone staff. 07/29/18 appt would not work. She accepted appt on 8/30 at 12pm, check in 1130am with Dr. Jannifer Franklin. I scheduled her.

## 2018-07-30 ENCOUNTER — Encounter: Payer: Self-pay | Admitting: Neurology

## 2018-07-30 ENCOUNTER — Ambulatory Visit: Payer: Medicare Other | Admitting: Neurology

## 2018-07-30 VITALS — BP 120/50 | HR 81 | Ht 62.0 in | Wt 114.5 lb

## 2018-07-30 DIAGNOSIS — G912 (Idiopathic) normal pressure hydrocephalus: Secondary | ICD-10-CM

## 2018-07-30 DIAGNOSIS — F329 Major depressive disorder, single episode, unspecified: Secondary | ICD-10-CM

## 2018-07-30 DIAGNOSIS — R413 Other amnesia: Secondary | ICD-10-CM | POA: Diagnosis not present

## 2018-07-30 DIAGNOSIS — G4489 Other headache syndrome: Secondary | ICD-10-CM | POA: Diagnosis not present

## 2018-07-30 DIAGNOSIS — F419 Anxiety disorder, unspecified: Principal | ICD-10-CM

## 2018-07-30 NOTE — Patient Instructions (Signed)
Stop the Effexor.  We will get CT of the brain.

## 2018-07-30 NOTE — Progress Notes (Signed)
Reason for visit: Memory disturbance, normal pressure hydrocephalus  Amanda Porter is an 72 y.o. female  History of present illness:  Amanda Porter is a 72 year old right-handed black female with a history of normal pressure hydrocephalus, status post VP shunt placement.  The patient has a pre-existing memory problem that has gradually progressed slightly since last seen, the patient remains on Aricept.  The patient began having a headache 2 or 3 months ago, shortly after starting low-dose Effexor taking 37.5 mg daily.  The patient has not had any falls associated with head trauma.  Her balance has been stable.  The patient denies any difficulty controlling the bladder at this point.  The patient has daily headaches, they are worse in the morning, sometimes associated with dizziness.  The patient is on oxygen throughout the day and night.  She is not on CPAP.  She comes to this office for an evaluation.  Past Medical History:  Diagnosis Date  . Anxiety   . Anxiety and depression   . COPD (chronic obstructive pulmonary disease) ([place])   . CVA (cerebral vascular accident) ([place])   . Dementia   . Depression   . Diabetes mellitus without complication ([place])   . Dysphasia   . History of lung cancer   . Hyperlipidemia   . Memory disturbance 11/20/2017  . Normal pressure hydrocephalus 2018  . Retinal detachment    Right  . Right carotid bruit 11/20/2017  . Thrombosis    Arterial to lower extremity?    Past Surgical History:  Procedure Laterality Date  . LUNG REMOVAL, PARTIAL     left upper lobe  . VENTRICULOPERITONEAL SHUNT  12/09/2016    Family History  Problem Relation Age of Onset  . Pneumonia Mother   . Cancer Father   . Diabetes Sister   . Diabetes Sister     Social history:  reports that she quit smoking about a year ago. She has a 25.00 pack-year smoking history. She has never used smokeless tobacco. She reports that she does not drink alcohol or use drugs.      Allergies  Allergen Reactions  . Lexapro [Escitalopram Oxalate] Other (See Comments)    Dizziness, nausea, fatigue  . Neosporin  [Neomycin-Polymyxin-Gramicidin] Rash    (Neosporin)    Medications:  Prior to Admission medications   Medication Sig Start Date End Date Taking? Authorizing Provider  albuterol (PROVENTIL HFA;VENTOLIN HFA) 108 (90 Base) MCG/ACT inhaler TAKE 2 PUFFS BY MOUTH EVERY 4 HOURS AS NEEDED 05/14/18  Yes Laverle Hobby, MD  busPIRone (BUSPAR) 7.5 MG tablet Take 1 tablet by mouth twice daily for anxiety. 02/23/18  Yes Pleas Koch, NP  clopidogrel (PLAVIX) 75 MG tablet TAKE 1 TABLET BY MOUTH EVERY DAY 12/08/17  Yes Pleas Koch, NP  Fluticasone-Umeclidin-Vilant (TRELEGY ELLIPTA) 100-62.5-25 MCG/INH AEPB Inhale 1 Inhaler into the lungs daily. 05/14/18  Yes Laverle Hobby, MD  glipiZIDE (GLIPIZIDE XL) 5 MG 24 hr tablet Take 1 tablet by mouth once daily with breakfast for diabetes. 02/19/18  Yes Pleas Koch, NP  ipratropium-albuterol (DUONEB) 0.5-2.5 (3) MG/3ML SOLN Take 41mLs by nebulization every 6 (six) hours and prn. Dx:J44.9 01/01/18  Yes Laverle Hobby, MD  losartan (COZAAR) 100 MG tablet Take 1 tablet (100 mg total) by mouth daily. 12/03/17  Yes Pleas Koch, NP  Melatonin 5 MG TABS Take 5 mg by mouth at bedtime.    Yes [provider]  Multiple Vitamin (MULTIVITAMIN) capsule Take 1 capsule by mouth daily.  Yes [provider]  ONE TOUCH LANCETS MISC 1 each by Other route 2 (two) times daily. 11/27/17  Yes Pleas Koch, NP  ONE TOUCH ULTRA TEST test strip USE 1 TWICE A DAY AS INSTRUCTED 02/08/18  Yes Pleas Koch, NP  OXYGEN 2 L continuous.    Yes [provider]  rosuvastatin (CRESTOR) 5 MG tablet TAKE 1 TABLET BY MOUTH EVERY DAY IN THE EVENING 05/25/18  Yes Pleas Koch, NP  traZODone (DESYREL) 50 MG tablet TAKE 1/2 TO 1 TABLET AT BEDTIME FOR SLEEP. 07/15/18  Yes Pleas Koch, NP   venlafaxine XR (EFFEXOR-XR) 37.5 MG 24 hr capsule TAKE 1 CAPSULE BY MOUTH ONCE DAILY WITH BREAKFAST FOR ANXIETY. 07/22/18  Yes Pleas Koch, NP    ROS:  Out of a complete 14 system review of symptoms, the patient complains only of the following symptoms, and all other reviewed systems are negative.  Decreased activity, fatigue Cough Frequent waking Anemia Headache  Blood pressure (!) 120/50, pulse 81, height 5\' 2"  (1.575 m), weight 114 lb 8 oz (51.9 kg), SpO2 95 %.  Physical Exam  General: The patient is alert and cooperative at the time of the examination.  Skin: No significant peripheral edema is noted.   Neurologic Exam  Mental status: The patient is alert and oriented x 3 at the time of the examination. The Mini-Mental status examination done today shows a total score 25/30.   Cranial nerves: Facial symmetry is present. Speech is normal, no aphasia or dysarthria is noted. Extraocular movements are full. Visual fields are full.  Motor: The patient has good strength in all 4 extremities.  Sensory examination: Soft touch sensation is symmetric on the face, arms, and legs.  Coordination: The patient has good finger-nose-finger and heel-to-shin bilaterally.  Gait and station: The patient has a normal gait. Tandem gait is unsteady. Romberg is negative. No drift is seen.  Reflexes: Deep tendon reflexes are symmetric.   Assessment/Plan:  1.  Memory disturbance  2.  Normal pressure hydrocephalus  3.  New onset headache  The patient will go off of Effexor for the next 2 weeks to see if this improves the headache.  The patient will undergo CT scan of the brain.  She will have blood work done today to include a sedimentation rate and C-reactive protein.  She will follow-up for her next scheduled revisit in December 2019.  The headaches are worse early in the morning, it is possible they could potentially be associated with some issues with CO2 retention.   Jill Alexanders MD 07/30/2018 12:34 PM  [facility] [address] [place], [place] [address]  Phone [phone] Fax [phone]

## 2018-07-31 LAB — SEDIMENTATION RATE: SED RATE: 26 mm/h (ref 0–40)

## 2018-07-31 LAB — C-REACTIVE PROTEIN: CRP: 4 mg/L (ref 0–10)

## 2018-08-03 ENCOUNTER — Ambulatory Visit: Payer: Medicare Other

## 2018-08-03 ENCOUNTER — Telehealth: Payer: Self-pay | Admitting: Neurology

## 2018-08-03 ENCOUNTER — Ambulatory Visit: Payer: Self-pay | Admitting: Primary Care

## 2018-08-03 MED ORDER — VENLAFAXINE HCL ER 37.5 MG PO CP24: ORAL_CAPSULE | ORAL | 0 refills | Status: DC

## 2018-08-03 NOTE — Telephone Encounter (Signed)
[employer] Medicare order sent to GI. No auth they will reach out to the pt to schedule.  °

## 2018-08-03 NOTE — Telephone Encounter (Signed)
Patient is experiencing dizziness- for 2-3 months- not improved. Daughter is calling to report.  Daughter thinks that her mother has vertigo- they have tried different things and nothing has helped.mother has had symptoms for some times and they thought her symptoms were related to medications. They are stopping the Effexor- today is the last pill. They wanted to know if [name] could send Rx for meclizine- but I told them that may interfer with testing- will send message and she can decide. Appointment is scheduled for 9/11 for evaluation. Mother has dementia so information is hard to retrieve at times. Reason for Disposition . [1] MILD dizziness (e.g., vertigo; walking normally) AND [2] has NOT been evaluated by physician for this  Answer Assessment - Initial Assessment Questions 1. DESCRIPTION: "Describe your dizziness."     She describes it has dizzy and tight- patient states she has headache she has dementia 2. VERTIGO: "Do you feel like either you or the room is spinning or tilting?"      Some spinning 3. LIGHTHEADED: "Do you feel lightheaded?" (e.g., somewhat faint, woozy, weak upon standing)     Feels like she is going to fall if she does not sit down 4. SEVERITY: "How bad is it?"  "Can you walk?"   - MILD - Feels unsteady but walking normally.   - MODERATE - Feels very unsteady when walking, but not falling; interferes with normal activities (e.g., school, work) .   - SEVERE - Unable to walk without falling (requires assistance).     mild 5. ONSET:  "When did the dizziness begin?"     2-3 months 6. AGGRAVATING FACTORS: "Does anything make it worse?" (e.g., standing, change in head position)     Changing positions, moving head up/down 7. CAUSE: "What do you think is causing the dizziness?"     Medication- Effexor-made it worse- they have stopped it  8. RECURRENT SYMPTOM: "Have you had dizziness before?" If so, ask: "When was the last time?" "What happened that time?"     no 9.  OTHER SYMPTOMS: "Do you have any other symptoms?" (e.g., headache, weakness, numbness, vomiting, earache)     Headache, nausea- 2-3 days 10. PREGNANCY: "Is there any chance you are pregnant?" "When was your last menstrual period?"       n/a  Protocols used: DIZZINESS - VERTIGO-A-AH

## 2018-08-03 NOTE — Telephone Encounter (Signed)
Noted, will respond to daughter via My Chart.

## 2018-08-05 ENCOUNTER — Ambulatory Visit: Payer: Medicare Other

## 2018-08-10 ENCOUNTER — Ambulatory Visit: Payer: Medicare Other | Admitting: Internal Medicine

## 2018-08-11 ENCOUNTER — Ambulatory Visit: Payer: Medicare Other | Admitting: Primary Care

## 2018-08-11 ENCOUNTER — Encounter: Payer: Self-pay | Admitting: Primary Care

## 2018-08-11 VITALS — BP 132/64 | HR 66 | Temp 98.3°F | Resp 14 | Ht 62.0 in | Wt 113.0 lb

## 2018-08-11 DIAGNOSIS — F419 Anxiety disorder, unspecified: Secondary | ICD-10-CM | POA: Diagnosis not present

## 2018-08-11 DIAGNOSIS — Z1239 Encounter for other screening for malignant neoplasm of breast: Secondary | ICD-10-CM

## 2018-08-11 DIAGNOSIS — E2839 Other primary ovarian failure: Secondary | ICD-10-CM

## 2018-08-11 DIAGNOSIS — R05 Cough: Secondary | ICD-10-CM

## 2018-08-11 DIAGNOSIS — R059 Cough, unspecified: Secondary | ICD-10-CM

## 2018-08-11 DIAGNOSIS — Z1231 Encounter for screening mammogram for malignant neoplasm of breast: Secondary | ICD-10-CM

## 2018-08-11 DIAGNOSIS — R42 Dizziness and giddiness: Secondary | ICD-10-CM | POA: Insufficient documentation

## 2018-08-11 DIAGNOSIS — F329 Major depressive disorder, single episode, unspecified: Secondary | ICD-10-CM

## 2018-08-11 MED ORDER — VORTIOXETINE HBR 5 MG PO TABS: 5.0000 mg | ORAL_TABLET | Freq: Every day | ORAL | 0 refills | Status: DC

## 2018-08-11 MED ORDER — BENZONATATE 200 MG PO CAPS: 200.0000 mg | ORAL_CAPSULE | Freq: Three times a day (TID) | ORAL | 0 refills | Status: DC | PRN

## 2018-08-11 NOTE — Patient Instructions (Addendum)
Stop venlafaxine ER 37.5 mg tablets (Effexor) for anxiety.  Stop buspirone (Buspar) for anxiety.  Start the Trintillex in 2 weeks as dicussed. Take 1 tablet by mouth once daily.  Proceed with the CT scan as scheduled.  You can take the benzonatate capsules three times daily as needed for cough. Please update me if the cough persists.   We will see you in a few weeks for follow up.  It was a pleasure to see you today!

## 2018-08-11 NOTE — Assessment & Plan Note (Signed)
Improved on Effexor but Effexor caused dizziness that is now improving since weaning off. Will stop Effexor today. Stop Buspar as this hasn't helped with symptoms and to reduce medication load.   Will have her start Trintellix in 2 weeks at next visit if symptoms of dizziness have resolved. She was successful on this in the past. Discussed that her anxiety may increase over the next two weeks and to be mindful.

## 2018-08-11 NOTE — Assessment & Plan Note (Signed)
Agree that Effexor may be the culprit.  Will have her stop now and follow up in 2 weeks for re-evaluation. She will undergo CT scan as scheduled. Continue neurology follow up.  Neuro exam today normal. No signs of acute CVA.

## 2018-08-11 NOTE — Progress Notes (Signed)
Subjective:    Patient ID: Amanda Porter, female    DOB: 07-07-1946, 72 y.o.   MRN: [id number]  HPI  Ms. Heidt is a 72 year old female with a history of CVA, Type 2 Diabetes, Dementia, normal pressure hydrocephalus, anxiety and depression who presents today with multiple complaints.  1) Dizziness: Chronic since after her hospital stay in April 2019. Her dizziness increased after starting low dose Effexor in early May for uncontrolled anxiety. She saw her neurologist in late August 2019 who recommended she abruptly stop her Effexor. We decided to wean her off slowly over two weeks. She has noticed the dizziness has improved on days she's not taking Effexor. She is now down to on capsule every 2 days.   She experiences dizziness in the mornings when waking, with position changes, also when doing activities at home such as household chores. She had an episode of what her daughter believes to be vertigo 2-3 months ago which lasted 2-3 days with nausea and some vomiting.   She will undergo CT scan of her head next week.   2) Anxiety and Depression: Currently managed on Buspar BID for anxiety, weaning off of Effexor XR 37.5 mg now. She doesn't feel the Buspar is helping. She started noticing an increase in anxiety since weaning off of her Effexor. She's failed treatment on Zoloft, Celexa, Lexapro. Her daughter confirms positive results with Effexor. She was once on Trintellix in the past and believes it was helpful.  3) Cough: Diagnosed and treated for viral URI in early August this year. Overall feeling better but has lingering cough with clear sputum. She is compliant to her COPD regimen. She denies fevers.   Review of Systems  Eyes: Negative for visual disturbance.  Respiratory: Positive for cough. Negative for shortness of breath.   Cardiovascular: Negative for chest pain.  Neurological: Positive for dizziness. Negative for headaches.  Psychiatric/Behavioral:       See HPI         Past Medical History:  Diagnosis Date  . Anxiety   . Anxiety and depression   . COPD (chronic obstructive pulmonary disease) ([place])   . CVA (cerebral vascular accident) ([place])   . Dementia   . Depression   . Diabetes mellitus without complication ([place])   . Dysphasia   . History of lung cancer   . Hyperlipidemia   . Memory disturbance 11/20/2017  . Normal pressure hydrocephalus 2018  . Retinal detachment    Right  . Right carotid bruit 11/20/2017  . Thrombosis    Arterial to lower extremity?     Social History   Socioeconomic History  . Marital status: Divorced    Spouse name: Not on file  . Number of children: 1  . Years of education: Not on file  . Highest education level: Not on file  Occupational History  . Occupation: Retired  Scientific laboratory technician  . Financial resource strain: Not on file  . Food insecurity:    Worry: Not on file    Inability: Not on file  . Transportation needs:    Medical: Not on file    Non-medical: Not on file  Tobacco Use  . Smoking status: Former Smoker    Packs/day: 0.50    Years: 50.00    Pack years: 25.00    Last attempt to quit: 08/01/2017    Years since quitting: 1.0  . Smokeless tobacco: Never Used  Substance and Sexual Activity  . Alcohol use: No  . Drug use:  No  . Sexual activity: Not on file  Lifestyle  . Physical activity:    Days per week: Not on file    Minutes per session: Not on file  . Stress: Not on file  Relationships  . Social connections:    Talks on phone: Not on file    Gets together: Not on file    Attends religious service: Not on file    Active member of club or organization: Not on file    Attends meetings of clubs or organizations: Not on file    Relationship status: Not on file  . Intimate partner violence:    Fear of current or ex partner: Not on file    Emotionally abused: Not on file    Physically abused: Not on file    Forced sexual activity: Not on file  Other Topics Concern  . Not on file  Social  History Narrative   Moved from [place].   Lives with daughter.   Right handed    Past Surgical History:  Procedure Laterality Date  . LUNG REMOVAL, PARTIAL     left upper lobe  . VENTRICULOPERITONEAL SHUNT  12/09/2016    Family History  Problem Relation Age of Onset  . Pneumonia Mother   . Cancer Father   . Diabetes Sister   . Diabetes Sister     Allergies  Allergen Reactions  . Lexapro [Escitalopram Oxalate] Other (See Comments)    Dizziness, nausea, fatigue  . Neosporin  [Neomycin-Polymyxin-Gramicidin] Rash    (Neosporin)    Current Outpatient Medications on File Prior to Visit  Medication Sig Dispense Refill  . albuterol (PROVENTIL HFA;VENTOLIN HFA) 108 (90 Base) MCG/ACT inhaler TAKE 2 PUFFS BY MOUTH EVERY 4 HOURS AS NEEDED 8.5 Inhaler 2  . clopidogrel (PLAVIX) 75 MG tablet TAKE 1 TABLET BY MOUTH EVERY DAY 90 tablet 2  . Fluticasone-Umeclidin-Vilant (TRELEGY ELLIPTA) 100-62.5-25 MCG/INH AEPB Inhale 1 Inhaler into the lungs daily. 1 each 5  . glipiZIDE (GLIPIZIDE XL) 5 MG 24 hr tablet Take 1 tablet by mouth once daily with breakfast for diabetes. 90 tablet 1  . ipratropium-albuterol (DUONEB) 0.5-2.5 (3) MG/3ML SOLN Take 83mLs by nebulization every 6 (six) hours and prn. Dx:J44.9 360 mL 5  . losartan (COZAAR) 100 MG tablet Take 1 tablet (100 mg total) by mouth daily. 90 tablet 3  . Melatonin 5 MG TABS Take 5 mg by mouth at bedtime.     . Multiple Vitamin (MULTIVITAMIN) capsule Take 1 capsule by mouth daily.    . ONE TOUCH LANCETS MISC 1 each by Other route 2 (two) times daily. 200 each 1  . ONE TOUCH ULTRA TEST test strip USE 1 TWICE A DAY AS INSTRUCTED 100 each 5  . OXYGEN 2 L continuous.     . rosuvastatin (CRESTOR) 5 MG tablet TAKE 1 TABLET BY MOUTH EVERY DAY IN THE EVENING 90 tablet 1  . traZODone (DESYREL) 50 MG tablet TAKE 1/2 TO 1 TABLET AT BEDTIME FOR SLEEP. 90 tablet 0   No current facility-administered medications on file prior to visit.     BP 132/64 (BP  Location: Right Arm, Patient Position: Sitting, Cuff Size: Normal)   Pulse 66   Temp 98.3 F (36.8 C) (Oral)   Resp 14   Ht 5\' 2"  (1.575 m)   Wt 113 lb 0.1 oz (51.3 kg)   SpO2 98% Comment: on 2 liters of O2  BMI 20.67 kg/m    Objective:   Physical Exam  Constitutional:  She is oriented to person, place, and time. She appears well-nourished.  HENT:  Mouth/Throat: Oropharynx is clear and moist.  Eyes: EOM are normal.  Neck: Neck supple.  Cardiovascular: Normal rate and regular rhythm.  Respiratory: Effort normal and breath sounds normal. She has no wheezes.  Neurological: She is alert and oriented to person, place, and time. No cranial nerve deficit. Coordination normal.  Skin: Skin is warm and dry.  Psychiatric: She has a normal mood and affect.           Assessment & Plan:  Post Infectious Cough:  Lingering but improved cough since viral URI in early August. Lungs clear on exam today. She doesn't appear sickly/ill. Suspect post infectious cough and will treat with a round of tessalon perles. She will update if symptoms persist.   Pleas Koch, NP

## 2018-08-13 ENCOUNTER — Other Ambulatory Visit: Payer: Self-pay | Admitting: Primary Care

## 2018-08-13 DIAGNOSIS — E119 Type 2 diabetes mellitus without complications: Secondary | ICD-10-CM

## 2018-08-16 ENCOUNTER — Other Ambulatory Visit: Payer: Medicare Other

## 2018-08-16 ENCOUNTER — Ambulatory Visit
Admission: RE | Admit: 2018-08-16 | Discharge: 2018-08-16 | Disposition: A | Discharge status: Home / Self Care | Payer: Medicare Other | Source: Ambulatory Visit | Attending: Neurology | Admitting: Neurology

## 2018-08-16 DIAGNOSIS — G4489 Other headache syndrome: Secondary | ICD-10-CM

## 2018-08-17 ENCOUNTER — Telehealth: Payer: Self-pay | Admitting: Neurology

## 2018-08-17 DIAGNOSIS — R42 Dizziness and giddiness: Secondary | ICD-10-CM

## 2018-08-17 MED ORDER — GABAPENTIN 100 MG PO CAPS: 100.0000 mg | ORAL_CAPSULE | Freq: Three times a day (TID) | ORAL | 3 refills | Status: DC

## 2018-08-17 NOTE — Telephone Encounter (Signed)
Rosaria Ferries, I put in a referral to ENT for this patient and forgot to mention Dr. Thornell Mule as preferred ENT. Thanks!

## 2018-08-17 NOTE — Telephone Encounter (Signed)
  I called the patient, talk with the daughter.  The patient has had CT scan of the brain that is stable from January 2019.  The patient still having either headaches or dizziness or both, it is hard to get a history from her.  The patient is off of Effexor at this point, we may try low-dose gabapentin to see if this helps.  I will send in a prescription.   CT brain 08/17/18:  IMPRESSION: This CT scan of the head without contrast shows the following: 1.     Intraventricular shunt entering the right frontal lobe with the tip near the septum pellucidum.  There is mild ventriculomegaly. 2.     Chronic lacunar infarction of the right thalamus 3.     Moderate chronic microvascular ischemic changes. 4.     There are no acute findings.  Compared to the CT scan dated 12/22/2017, there is no significant change.

## 2018-08-18 ENCOUNTER — Other Ambulatory Visit: Payer: Self-pay | Admitting: Primary Care

## 2018-08-18 ENCOUNTER — Encounter: Payer: Self-pay | Admitting: Primary Care

## 2018-08-18 DIAGNOSIS — Z1231 Encounter for screening mammogram for malignant neoplasm of breast: Secondary | ICD-10-CM

## 2018-08-22 ENCOUNTER — Other Ambulatory Visit: Payer: Self-pay | Admitting: Primary Care

## 2018-08-22 DIAGNOSIS — I639 Cerebral infarction, unspecified: Secondary | ICD-10-CM

## 2018-08-27 ENCOUNTER — Encounter: Payer: Self-pay | Admitting: Primary Care

## 2018-08-27 ENCOUNTER — Ambulatory Visit: Payer: Medicare Other | Admitting: Primary Care

## 2018-08-27 ENCOUNTER — Ambulatory Visit: Payer: Self-pay | Admitting: Primary Care

## 2018-08-27 VITALS — BP 134/64 | HR 82 | Temp 98.1°F | Ht 62.0 in | Wt 112.8 lb

## 2018-08-27 DIAGNOSIS — F329 Major depressive disorder, single episode, unspecified: Secondary | ICD-10-CM

## 2018-08-27 DIAGNOSIS — D649 Anemia, unspecified: Secondary | ICD-10-CM | POA: Diagnosis not present

## 2018-08-27 DIAGNOSIS — I10 Essential (primary) hypertension: Secondary | ICD-10-CM

## 2018-08-27 DIAGNOSIS — E119 Type 2 diabetes mellitus without complications: Secondary | ICD-10-CM | POA: Diagnosis not present

## 2018-08-27 DIAGNOSIS — G912 (Idiopathic) normal pressure hydrocephalus: Secondary | ICD-10-CM | POA: Diagnosis not present

## 2018-08-27 DIAGNOSIS — R42 Dizziness and giddiness: Secondary | ICD-10-CM

## 2018-08-27 DIAGNOSIS — D509 Iron deficiency anemia, unspecified: Secondary | ICD-10-CM

## 2018-08-27 DIAGNOSIS — F419 Anxiety disorder, unspecified: Secondary | ICD-10-CM

## 2018-08-27 DIAGNOSIS — F32A Depression, unspecified: Secondary | ICD-10-CM

## 2018-08-27 LAB — IBC PANEL
IRON: 55 ug/dL (ref 42–145)
Saturation Ratios: 17.2 % — ABNORMAL LOW (ref 20.0–50.0)
Transferrin: 228 mg/dL (ref 212.0–360.0)

## 2018-08-27 LAB — CBC
HCT: 36 % (ref 36.0–46.0)
Hemoglobin: 12.1 g/dL (ref 12.0–15.0)
MCHC: 33.7 g/dL (ref 30.0–36.0)
MCV: 95.4 fl (ref 78.0–100.0)
Platelets: 320 10*3/uL (ref 150.0–400.0)
RBC: 3.78 Mil/uL — ABNORMAL LOW (ref 3.87–5.11)
RDW: 16.2 % — ABNORMAL HIGH (ref 11.5–15.5)
WBC: 7.4 10*3/uL (ref 4.0–10.5)

## 2018-08-27 LAB — BASIC METABOLIC PANEL
BUN: 19 mg/dL (ref 6–23)
CO2: 31 meq/L (ref 19–32)
CREATININE: 0.77 mg/dL (ref 0.40–1.20)
Calcium: 10 mg/dL (ref 8.4–10.5)
Chloride: 105 mEq/L (ref 96–112)
GFR: 94.37 mL/min (ref >60.00)
GLUCOSE: 104 mg/dL — AB (ref 70–99)
Potassium: 3.9 mEq/L (ref 3.5–5.1)
Sodium: 143 mEq/L (ref 135–145)

## 2018-08-27 LAB — HEMOGLOBIN A1C: Hgb A1c MFr Bld: 6.5 % (ref 4.6–6.5)

## 2018-08-27 NOTE — Assessment & Plan Note (Signed)
Stable in the office today, continue current regimen. 

## 2018-08-27 NOTE — Assessment & Plan Note (Signed)
Increased anxiety since off all anxiety medications. Start Trintellix 5 mg daily. Consider adding Remeron if needed but will need to exercise caution. Her daughter will update in 4 weeks.

## 2018-08-27 NOTE — Patient Instructions (Addendum)
Stop by the lab prior to leaving today. I will notify you of your results once received.   Start the Trintellex once daily. Update me in 4 weeks. We can always increase the dose at that point.   Follow up with the ENT and neurosurgeon as scheduled.  Please schedule a physical with me and Medicare Wellness Visit with our nurse in 6 months. You may also schedule a lab only appointment 3-4 days prior. We will discuss your lab results in detail during your physical.   It was a pleasure to see you today!

## 2018-08-27 NOTE — Assessment & Plan Note (Signed)
Repeat IBC and CBC pending.  Compliant to oral iron.

## 2018-08-27 NOTE — Assessment & Plan Note (Signed)
Improved since off of Effexor but no resolve.  She will be seeing her neurosurgeon soon. Recent CT head reviewed and is without acute changes. Consider changing glipizide if dizziness persists.  She will update.

## 2018-08-27 NOTE — Progress Notes (Signed)
Subjective:    Patient ID: Amanda Porter, female    DOB: 1946-06-04, 72 y.o.   MRN: [id number]  HPI  Ms. Nesler is a 72 year old female who presents today for follow up.  1) Dizziness: Chronic since hospital stay in April 2019, worse after initiation of Effexor in early May for GAD. She underwent CT head last 11 days ago which was without acute findings and no significant change since January 2019. She is following with neurology.  Since her last visit her dizziness has improved but is without resolve. She has an appointment with her neurosurgeon soon for follow up of her VP shunt.   2) GAD: Chronic and uncontrolled. She did well on Effexor in terms of anxiety but this caused side effects of dizziness so she was weaned off. She didn't believe buspar was helping so this was discontinued. She has tried and failed numerous medications for anxiety as she doesn't like the way they make her feel. She was once on managed Trintellix and thinks she did well. This was discontinued during her initial visit in Fall 2018.  Since her last visit her anxiety has increased, she has not taken either Effexor or Buspar. She has not started the Trintellix.   3) Essential Hypertension: Currently managed on losartan 100 mg. She denies chest pain.   BP Readings from Last 3 Encounters:  08/27/18 134/64  08/11/18 132/64  07/30/18 (!) 120/50   4) Type 2 Diabetes:  Current medications include: Glipizide ER 5 mg.  She is checking her blood glucose one time daily and is getting readings of: Before breakfast: 80-120  Last A1C: 7.5 in March 2019 Last Eye Exam: Due this year. Last Foot Exam: Due this year Pneumonia Vaccination: completed in 2014 ACE/ARB: Losartan  Statin: Crestor   Review of Systems  Eyes: Negative for visual disturbance.  Respiratory: Negative for shortness of breath.   Cardiovascular: Negative for chest pain.  Neurological: Positive for dizziness.  Psychiatric/Behavioral:     See HPI       Past Medical History:  Diagnosis Date  . Anxiety   . Anxiety and depression   . COPD (chronic obstructive pulmonary disease) ([place])   . CVA (cerebral vascular accident) ([place])   . Dementia   . Depression   . Diabetes mellitus without complication ([place])   . Dysphasia   . History of lung cancer   . Hyperlipidemia   . Memory disturbance 11/20/2017  . Normal pressure hydrocephalus 2018  . Retinal detachment    Right  . Right carotid bruit 11/20/2017  . Thrombosis    Arterial to lower extremity?     Social History   Socioeconomic History  . Marital status: Divorced    Spouse name: Not on file  . Number of children: 1  . Years of education: Not on file  . Highest education level: Not on file  Occupational History  . Occupation: Retired  Scientific laboratory technician  . Financial resource strain: Not on file  . Food insecurity:    Worry: Not on file    Inability: Not on file  . Transportation needs:    Medical: Not on file    Non-medical: Not on file  Tobacco Use  . Smoking status: Former Smoker    Packs/day: 0.50    Years: 50.00    Pack years: 25.00    Last attempt to quit: 08/01/2017    Years since quitting: 1.0  . Smokeless tobacco: Never Used  Substance and Sexual Activity  .  Alcohol use: No  . Drug use: No  . Sexual activity: Not on file  Lifestyle  . Physical activity:    Days per week: Not on file    Minutes per session: Not on file  . Stress: Not on file  Relationships  . Social connections:    Talks on phone: Not on file    Gets together: Not on file    Attends religious service: Not on file    Active member of club or organization: Not on file    Attends meetings of clubs or organizations: Not on file    Relationship status: Not on file  . Intimate partner violence:    Fear of current or ex partner: Not on file    Emotionally abused: Not on file    Physically abused: Not on file    Forced sexual activity: Not on file  Other Topics Concern  . Not  on file  Social History Narrative   Moved from [place].   Lives with daughter.   Right handed    Past Surgical History:  Procedure Laterality Date  . LUNG REMOVAL, PARTIAL     left upper lobe  . VENTRICULOPERITONEAL SHUNT  12/09/2016    Family History  Problem Relation Age of Onset  . Pneumonia Mother   . Cancer Father   . Diabetes Sister   . Diabetes Sister     Allergies  Allergen Reactions  . Lexapro [Escitalopram Oxalate] Other (See Comments)    Dizziness, nausea, fatigue  . Neosporin  [Neomycin-Polymyxin-Gramicidin] Rash    (Neosporin)    Current Outpatient Medications on File Prior to Visit  Medication Sig Dispense Refill  . albuterol (PROVENTIL HFA;VENTOLIN HFA) 108 (90 Base) MCG/ACT inhaler TAKE 2 PUFFS BY MOUTH EVERY 4 HOURS AS NEEDED 8.5 Inhaler 2  . clopidogrel (PLAVIX) 75 MG tablet TAKE 1 TABLET BY MOUTH EVERY DAY 90 tablet 1  . Fluticasone-Umeclidin-Vilant (TRELEGY ELLIPTA) 100-62.5-25 MCG/INH AEPB Inhale 1 Inhaler into the lungs daily. 1 each 5  . glipiZIDE (GLUCOTROL XL) 5 MG 24 hr tablet TAKE 1 TABLET BY MOUTH ONCE DAILY WITH BREAKFAST FOR DIABETES. 90 tablet 1  . ipratropium-albuterol (DUONEB) 0.5-2.5 (3) MG/3ML SOLN Take 74mLs by nebulization every 6 (six) hours and prn. Dx:J44.9 360 mL 5  . losartan (COZAAR) 100 MG tablet Take 1 tablet (100 mg total) by mouth daily. 90 tablet 3  . Melatonin 5 MG TABS Take 5 mg by mouth at bedtime.     . Multiple Vitamin (MULTIVITAMIN) capsule Take 1 capsule by mouth daily.    . ONE TOUCH LANCETS MISC 1 each by Other route 2 (two) times daily. 200 each 1  . ONE TOUCH ULTRA TEST test strip USE 1 TWICE A DAY AS INSTRUCTED 100 each 5  . OXYGEN 2 L continuous.     . rosuvastatin (CRESTOR) 5 MG tablet TAKE 1 TABLET BY MOUTH EVERY DAY IN THE EVENING 90 tablet 1  . traZODone (DESYREL) 50 MG tablet TAKE 1/2 TO 1 TABLET AT BEDTIME FOR SLEEP. 90 tablet 0  . gabapentin (NEURONTIN) 100 MG capsule Take 1 capsule (100 mg total) by mouth 3  (three) times daily. (Patient not taking: Reported on 08/27/2018) 90 capsule 3  . vortioxetine HBr (TRINTELLIX) 5 MG TABS tablet Take 1 tablet (5 mg total) by mouth daily. (Patient not taking: Reported on 08/27/2018) 90 tablet 0   No current facility-administered medications on file prior to visit.     BP 134/64   Pulse 82  Temp 98.1 F (36.7 C) (Oral)   Ht 5\' 2"  (1.575 m)   Wt 112 lb 12 oz (51.1 kg)   SpO2 95%   BMI 20.62 kg/m    Objective:   Physical Exam  Constitutional: She appears well-nourished.  Neck: Neck supple.  Cardiovascular: Normal rate and regular rhythm.  Respiratory: Effort normal and breath sounds normal.  Skin: Skin is warm and dry.  Psychiatric: She has a normal mood and affect.           Assessment & Plan:

## 2018-08-27 NOTE — Assessment & Plan Note (Addendum)
A1C of 7.5 in March 2019, repeat A1C pending. Pneumonia vaccination UTD. Managed on statin and ARB. Foot exam today. She will schedule an eye exam this year. Continue Glipizide ER 5 mg, consider changing if dizziness persists.   Follow up in 6 months.

## 2018-08-27 NOTE — Assessment & Plan Note (Signed)
Due for follow up with neurosurgeon soon. Recent CT head without acute changes.

## 2018-09-03 ENCOUNTER — Other Ambulatory Visit: Payer: Self-pay

## 2018-09-03 ENCOUNTER — Other Ambulatory Visit: Payer: Self-pay | Admitting: Internal Medicine

## 2018-09-03 DIAGNOSIS — J449 Chronic obstructive pulmonary disease, unspecified: Secondary | ICD-10-CM

## 2018-09-03 MED ORDER — ALBUTEROL SULFATE HFA 108 (90 BASE) MCG/ACT IN AERS: INHALATION_SPRAY | RESPIRATORY_TRACT | 2 refills | Status: DC

## 2018-09-06 ENCOUNTER — Telehealth: Payer: Self-pay | Admitting: Primary Care

## 2018-09-06 DIAGNOSIS — R110 Nausea: Secondary | ICD-10-CM

## 2018-09-06 MED ORDER — ONDANSETRON 4 MG PO TBDP: 4.0000 mg | ORAL_TABLET | Freq: Three times a day (TID) | ORAL | 0 refills | Status: DC | PRN

## 2018-09-06 NOTE — Telephone Encounter (Signed)
Patient's daughter [name] on [place]) notified as instructed by telephone and verbalized understanding.

## 2018-09-06 NOTE — Telephone Encounter (Signed)
Copied from [place] [phone]. Topic: Quick Communication - See Telephone Encounter >> Sep 06, 2018  9:51 AM Blase Mess A wrote: CRM for notification. See Telephone encounter for: 09/06/18.  Patient is experiencing [place] on vortioxetine HBr (TRINTELLIX) 5 MG TABS tablet [[id number]]  pat  has been on zofran in the past.  She is requesting a new script for zofran.  Patients daughter [name] is requesting a call back [phone]

## 2018-09-06 NOTE — Telephone Encounter (Signed)
Noted, will send a small amount of ondansetron (Zofran) to her pharmacy as she adjusts to the Trintellix. If her nausea persists then we will need to discuss different treatment for anxiety.

## 2018-09-09 DIAGNOSIS — R42 Dizziness and giddiness: Secondary | ICD-10-CM

## 2018-09-10 NOTE — Telephone Encounter (Signed)
Amanda Porter, how do I put in a referral for balance therapy? Is this through ENT? PT? It's for dizziness.

## 2018-09-10 NOTE — Telephone Encounter (Signed)
Referral placed. Thank you

## 2018-09-11 ENCOUNTER — Other Ambulatory Visit: Payer: Self-pay | Admitting: Neurology

## 2018-09-12 ENCOUNTER — Other Ambulatory Visit: Payer: Self-pay | Admitting: Primary Care

## 2018-09-12 DIAGNOSIS — I10 Essential (primary) hypertension: Secondary | ICD-10-CM

## 2018-09-26 DIAGNOSIS — F329 Major depressive disorder, single episode, unspecified: Secondary | ICD-10-CM

## 2018-09-26 DIAGNOSIS — F419 Anxiety disorder, unspecified: Principal | ICD-10-CM

## 2018-09-27 MED ORDER — BUSPIRONE HCL 7.5 MG PO TABS: 7.5000 mg | ORAL_TABLET | Freq: Two times a day (BID) | ORAL | 1 refills | Status: DC

## 2018-09-27 MED ORDER — VORTIOXETINE HBR 5 MG PO TABS: ORAL_TABLET | ORAL | 0 refills | Status: DC

## 2018-10-07 ENCOUNTER — Telehealth: Payer: Self-pay | Admitting: Primary Care

## 2018-10-07 NOTE — Telephone Encounter (Signed)
Completed and placed on [place] desk. 

## 2018-10-07 NOTE — Telephone Encounter (Signed)
[name], see FMLA paperwork attached for Amanda Porter, Amanda Porter's daughter.

## 2018-10-07 NOTE — Telephone Encounter (Signed)
FMLA paperwork in Amanda Porter's in box For review and signature

## 2018-10-07 NOTE — Telephone Encounter (Signed)
Paperwork faxed Copy for pt Copy for scan  Sent my chart message letting pt know paperwork has been faxed

## 2018-10-10 ENCOUNTER — Other Ambulatory Visit: Payer: Self-pay | Admitting: Primary Care

## 2018-10-10 DIAGNOSIS — G4700 Insomnia, unspecified: Secondary | ICD-10-CM

## 2018-10-11 ENCOUNTER — Ambulatory Visit
Admission: RE | Admit: 2018-10-11 | Discharge: 2018-10-11 | Disposition: A | Discharge status: Home / Self Care | Payer: Medicare Other | Source: Ambulatory Visit | Attending: Primary Care | Admitting: Primary Care

## 2018-10-11 DIAGNOSIS — E2839 Other primary ovarian failure: Secondary | ICD-10-CM

## 2018-10-11 DIAGNOSIS — Z1231 Encounter for screening mammogram for malignant neoplasm of breast: Secondary | ICD-10-CM

## 2018-10-11 DIAGNOSIS — M8580 Other specified disorders of bone density and structure, unspecified site: Secondary | ICD-10-CM

## 2018-10-12 ENCOUNTER — Other Ambulatory Visit: Payer: Self-pay | Admitting: Primary Care

## 2018-10-12 DIAGNOSIS — R928 Other abnormal and inconclusive findings on diagnostic imaging of breast: Secondary | ICD-10-CM

## 2018-10-12 MED ORDER — ALENDRONATE SODIUM 70 MG PO TABS: ORAL_TABLET | ORAL | 3 refills | Status: DC

## 2018-10-13 NOTE — Telephone Encounter (Signed)
Completed and placed on [place] desk. 

## 2018-10-13 NOTE — Telephone Encounter (Signed)
FMLA paperwork in Amanda Porter's in box  Please initial and date changes

## 2018-10-13 NOTE — Telephone Encounter (Signed)
[name], can you fix?

## 2018-10-14 NOTE — Telephone Encounter (Signed)
Paperwork faxed Sent my chart message letting pt know  Copy for scan Copy for pt

## 2018-10-18 ENCOUNTER — Other Ambulatory Visit: Payer: Self-pay

## 2018-10-25 ENCOUNTER — Ambulatory Visit: Payer: Self-pay

## 2018-10-25 ENCOUNTER — Ambulatory Visit
Admission: RE | Admit: 2018-10-25 | Discharge: 2018-10-25 | Disposition: A | Discharge status: Home / Self Care | Payer: Medicare Other | Source: Ambulatory Visit | Attending: Primary Care | Admitting: Primary Care

## 2018-10-25 DIAGNOSIS — R928 Other abnormal and inconclusive findings on diagnostic imaging of breast: Secondary | ICD-10-CM

## 2018-10-26 ENCOUNTER — Telehealth: Payer: Self-pay | Admitting: Oncology

## 2018-10-26 NOTE — Telephone Encounter (Signed)
Patients daughter called to cancel

## 2018-10-31 HISTORY — PX: VENTRICULOPERITONEAL SHUNT: SHX204

## 2018-11-05 ENCOUNTER — Other Ambulatory Visit: Payer: Medicare Other

## 2018-11-05 ENCOUNTER — Telehealth: Payer: Self-pay

## 2018-11-05 ENCOUNTER — Ambulatory Visit: Payer: Medicare Other | Admitting: Oncology

## 2018-11-05 MED ORDER — ATORVASTATIN CALCIUM 10 MG PO TABS
10.00 | ORAL_TABLET | ORAL | Status: DC
Start: 2018-11-05 — End: 2018-11-05

## 2018-11-05 MED ORDER — TRAZODONE HCL 50 MG PO TABS
50.00 | ORAL_TABLET | ORAL | Status: DC
Start: ? — End: 2018-11-05

## 2018-11-05 MED ORDER — LOSARTAN POTASSIUM 100 MG PO TABS
100.00 | ORAL_TABLET | ORAL | Status: DC
Start: 2018-11-06 — End: 2018-11-05

## 2018-11-05 MED ORDER — MORPHINE SULFATE 4 MG/ML IJ SOLN
2.00 | INTRAMUSCULAR | Status: DC
Start: ? — End: 2018-11-05

## 2018-11-05 MED ORDER — IPRATROPIUM-ALBUTEROL 0.5-2.5 (3) MG/3ML IN SOLN
3.00 | RESPIRATORY_TRACT | Status: DC
Start: ? — End: 2018-11-05

## 2018-11-05 MED ORDER — ONDANSETRON HCL 4 MG/2ML IJ SOLN
4.00 | INTRAMUSCULAR | Status: DC
Start: ? — End: 2018-11-05

## 2018-11-05 MED ORDER — MELATONIN 3 MG PO TABS
3.00 | ORAL_TABLET | ORAL | Status: DC
Start: ? — End: 2018-11-05

## 2018-11-05 MED ORDER — ACETAMINOPHEN 325 MG PO TABS
650.00 | ORAL_TABLET | ORAL | Status: DC
Start: ? — End: 2018-11-05

## 2018-11-05 MED ORDER — DOCUSATE SODIUM 100 MG PO CAPS
100.00 | ORAL_CAPSULE | ORAL | Status: DC
Start: 2018-11-05 — End: 2018-11-05

## 2018-11-05 MED ORDER — PANTOPRAZOLE SODIUM 20 MG PO TBEC
20.00 | DELAYED_RELEASE_TABLET | ORAL | Status: DC
Start: 2018-11-06 — End: 2018-11-05

## 2018-11-05 MED ORDER — BUSPIRONE HCL 7.5 MG PO TABS
7.50 | ORAL_TABLET | ORAL | Status: DC
Start: 2018-11-05 — End: 2018-11-05

## 2018-11-05 MED ORDER — PREDNISONE 20 MG PO TABS
40.00 | ORAL_TABLET | ORAL | Status: DC
Start: 2018-11-06 — End: 2018-11-05

## 2018-11-05 MED ORDER — ALBUTEROL SULFATE HFA 108 (90 BASE) MCG/ACT IN AERS
2.00 | INHALATION_SPRAY | RESPIRATORY_TRACT | Status: DC
Start: ? — End: 2018-11-05

## 2018-11-05 MED ORDER — OXYCODONE-ACETAMINOPHEN 5-325 MG PO TABS
1.00 | ORAL_TABLET | ORAL | Status: DC
Start: ? — End: 2018-11-05

## 2018-11-05 NOTE — Telephone Encounter (Signed)
[facility] said pt is presently in hospital at [facility] with hydrocephalus. Pt will be d/c on PT and angela wants to know if pt has Home health needs with PCP take care of that. Pt will be followed at [facility] and pt will be staying in [facility] with family during her recovery. Gentry Fitz NP said yes she would take care of [place] orders. Levada Dy said [facility] will be sending a care plan for approval. FYI to Gentry Fitz NP.

## 2018-11-05 NOTE — Telephone Encounter (Signed)
Noted  

## 2018-11-06 DIAGNOSIS — F419 Anxiety disorder, unspecified: Principal | ICD-10-CM

## 2018-11-06 DIAGNOSIS — F32A Depression, unspecified: Secondary | ICD-10-CM

## 2018-11-06 DIAGNOSIS — F329 Major depressive disorder, single episode, unspecified: Secondary | ICD-10-CM

## 2018-11-19 ENCOUNTER — Ambulatory Visit: Payer: Self-pay | Admitting: Neurology

## 2018-11-22 DIAGNOSIS — G912 (Idiopathic) normal pressure hydrocephalus: Secondary | ICD-10-CM

## 2018-11-29 NOTE — Telephone Encounter (Signed)
Amanda Ferries, do you mind providing either me or the patient with the phone number for [facility]? I placed a referral and I'm sure they have been backed up with the holidays.

## 2018-11-30 ENCOUNTER — Telehealth: Payer: Self-pay

## 2018-11-30 DIAGNOSIS — J449 Chronic obstructive pulmonary disease, unspecified: Secondary | ICD-10-CM

## 2018-11-30 NOTE — Telephone Encounter (Signed)
Mickel Baas with [facility] request verbal orders for [facility] PT 2 x a wk for 4 wks.

## 2018-12-01 NOTE — Telephone Encounter (Signed)
Approved.  

## 2018-12-02 NOTE — Telephone Encounter (Signed)
Gave the approval for the verbal orders 

## 2018-12-09 ENCOUNTER — Other Ambulatory Visit: Payer: Self-pay | Admitting: Primary Care

## 2018-12-09 DIAGNOSIS — E785 Hyperlipidemia, unspecified: Secondary | ICD-10-CM

## 2018-12-13 ENCOUNTER — Telehealth: Payer: Self-pay | Admitting: Primary Care

## 2018-12-13 ENCOUNTER — Ambulatory Visit: Payer: Self-pay | Admitting: Internal Medicine

## 2018-12-13 NOTE — Telephone Encounter (Signed)
[name] called back to schedule appt for pt for Tues 11/14 @2pm . She is unable to come so she is requesting a call to discuss pt's medications. Another family member will bring pt to appt.

## 2018-12-13 NOTE — Telephone Encounter (Signed)
Pt was scheduled for 15 min appt for severe dizziness with [facility]. I called pt's daughter to r/s but I was unable to find a time that worked for her since she has a procedure scheduled this afternoon. I tried other [place] locations but I was unable to find anything that worked for her. I advised her to go to urgent care but she asked if you could order labs for pt so she can have labs to evaluate since this is ongoing. [name] did say pt's condition has got worse. I did offer appts tomorrow but [name] said she is working.

## 2018-12-14 ENCOUNTER — Ambulatory Visit (INDEPENDENT_AMBULATORY_CARE_PROVIDER_SITE_OTHER): Payer: Medicare Other | Admitting: Primary Care

## 2018-12-14 ENCOUNTER — Encounter: Payer: Self-pay | Admitting: Primary Care

## 2018-12-14 VITALS — BP 134/62 | HR 102 | Temp 97.9°F | Wt 116.0 lb

## 2018-12-14 DIAGNOSIS — G912 (Idiopathic) normal pressure hydrocephalus: Secondary | ICD-10-CM | POA: Diagnosis not present

## 2018-12-14 DIAGNOSIS — R42 Dizziness and giddiness: Secondary | ICD-10-CM

## 2018-12-14 DIAGNOSIS — H814 Vertigo of central origin: Secondary | ICD-10-CM

## 2018-12-14 DIAGNOSIS — F329 Major depressive disorder, single episode, unspecified: Secondary | ICD-10-CM

## 2018-12-14 DIAGNOSIS — F419 Anxiety disorder, unspecified: Principal | ICD-10-CM

## 2018-12-14 LAB — CBC WITH DIFFERENTIAL/PLATELET
Basophils Absolute: 0.1 10*3/uL (ref 0.0–0.1)
Basophils Relative: 0.7 % (ref 0.0–3.0)
Eosinophils Absolute: 0.1 10*3/uL (ref 0.0–0.7)
Eosinophils Relative: 1.3 % (ref 0.0–5.0)
HCT: 34.6 % — ABNORMAL LOW (ref 36.0–46.0)
Hemoglobin: 11.6 g/dL — ABNORMAL LOW (ref 12.0–15.0)
Lymphocytes Relative: 30.1 % (ref 12.0–46.0)
Lymphs Abs: 2.5 10*3/uL (ref 0.7–4.0)
MCHC: 33.6 g/dL (ref 30.0–36.0)
MCV: 98.3 fl (ref 78.0–100.0)
Monocytes Absolute: 0.8 10*3/uL (ref 0.1–1.0)
Monocytes Relative: 10.4 % (ref 3.0–12.0)
NEUTROS ABS: 4.7 10*3/uL (ref 1.4–7.7)
Neutrophils Relative %: 57.5 % (ref 43.0–77.0)
Platelets: 294 10*3/uL (ref 150.0–400.0)
RBC: 3.52 Mil/uL — ABNORMAL LOW (ref 3.87–5.11)
RDW: 13 % (ref 11.5–15.5)
WBC: 8.2 10*3/uL (ref 4.0–10.5)

## 2018-12-14 LAB — BASIC METABOLIC PANEL
BUN: 19 mg/dL (ref 6–23)
CO2: 31 mEq/L (ref 19–32)
Calcium: 10.2 mg/dL (ref 8.4–10.5)
Chloride: 103 mEq/L (ref 96–112)
Creatinine, Ser: 0.59 mg/dL (ref 0.40–1.20)
GFR: 128.22 mL/min (ref >60.00)
Glucose, Bld: 113 mg/dL — ABNORMAL HIGH (ref 70–99)
Potassium: 3.9 mEq/L (ref 3.5–5.1)
SODIUM: 141 meq/L (ref 135–145)

## 2018-12-14 LAB — HEMOGLOBIN A1C: HEMOGLOBIN A1C: 6.9 % — AB (ref 4.6–6.5)

## 2018-12-14 MED ORDER — BUSPIRONE HCL 15 MG PO TABS: 15.0000 mg | ORAL_TABLET | Freq: Two times a day (BID) | ORAL | 0 refills | Status: DC

## 2018-12-14 NOTE — Telephone Encounter (Signed)
Spoke with patient's daughter during her visit today. She will be taking patient to [facility] for further evaluation of her shunt.

## 2018-12-14 NOTE — Progress Notes (Signed)
Subjective:    Patient ID: Amanda Porter, female    DOB: 09/04/1946, 73 y.o.   MRN: [id number]  HPI  Amanda Porter is a 73 year old female with a history of normal pressure hydrocephalus with distal malfunction (vp shunt revision for distal occlusion on 11/04/18), chronic dizziness, uncontrolled anxiety, CVA, hypertension who presents today with a chief complaint of dizziness and anxiety.  She was last evaluated by her surgeon on 11/17/2018, endorsed that dizziness and headaches had improved. Her daughter endorsed that memory had improved.  About three-four days ago she got out of bed and felt "drunk as a fish". Her dizziness as been present since. She is visited by home health physical therapy who is working on overall strength and balance. She feels as though the room is moving around her, also feels off balance. She will sit on the side of the bed before rising, then will have to hold onto objects when walking in the home. This feels slightly different than her prior dizziness. She doesn't use a walker or cane.  She denies falls, numbness/tingling, changes in left sided unilateral weakness, chest pain, increased shortness of breath, changes in speech, changes in vision. She does have some parietal lobe headache, intermittent.   She's checking her glucose twice daily: AM fasting: 103-110, more recently high 100's-low 200's.  Before dinner: 103-110, more recently in the high 100's-low 200's.   She's been managed on numerous medications for chronic anxiety including buspirone, mirtazapine, venlafaxine, Trintellix, Zoloft, Lexapro, Celexa. She did note improvement on venlafaxine but thought this caused dizziness. Given numerous attempts to control her anxiety without success she was referred to psychiatry.   She continues to struggle with chronic anxiety. She is compliant to her buspirone twice daily and Trazodone.   BP Readings from Last 3 Encounters:  12/14/18 134/62  08/27/18 134/64    08/11/18 132/64     Review of Systems  Constitutional: Negative for fever.  Eyes: Negative for visual disturbance.  Respiratory: Negative for shortness of breath.   Cardiovascular: Negative for chest pain.  Neurological: Positive for dizziness and headaches.       Past Medical History:  Diagnosis Date  . Anxiety   . Anxiety and depression   . COPD (chronic obstructive pulmonary disease) ([place])   . CVA (cerebral vascular accident) ([place])   . Dementia ([place])   . Depression   . Diabetes mellitus without complication ([place])   . Dysphasia   . History of lung cancer   . Hyperlipidemia   . Memory disturbance 11/20/2017  . Normal pressure hydrocephalus ([place]) 2018  . Retinal detachment    Right  . Right carotid bruit 11/20/2017  . Thrombosis    Arterial to lower extremity?     Social History   Socioeconomic History  . Marital status: Divorced    Spouse name: Not on file  . Number of children: 1  . Years of education: Not on file  . Highest education level: Not on file  Occupational History  . Occupation: Retired  Scientific laboratory technician  . Financial resource strain: Not on file  . Food insecurity:    Worry: Not on file    Inability: Not on file  . Transportation needs:    Medical: Not on file    Non-medical: Not on file  Tobacco Use  . Smoking status: Former Smoker    Packs/day: 0.50    Years: 50.00    Pack years: 25.00    Last attempt to quit: 08/01/2017  Years since quitting: 1.3  . Smokeless tobacco: Never Used  Substance and Sexual Activity  . Alcohol use: No  . Drug use: No  . Sexual activity: Not on file  Lifestyle  . Physical activity:    Days per week: Not on file    Minutes per session: Not on file  . Stress: Not on file  Relationships  . Social connections:    Talks on phone: Not on file    Gets together: Not on file    Attends religious service: Not on file    Active member of club or organization: Not on file    Attends meetings of clubs or  organizations: Not on file    Relationship status: Not on file  . Intimate partner violence:    Fear of current or ex partner: Not on file    Emotionally abused: Not on file    Physically abused: Not on file    Forced sexual activity: Not on file  Other Topics Concern  . Not on file  Social History Narrative   Moved from [place].   Lives with daughter.   Right handed    Past Surgical History:  Procedure Laterality Date  . LUNG REMOVAL, PARTIAL     left upper lobe  . VENTRICULOPERITONEAL SHUNT  12/09/2016    Family History  Problem Relation Age of Onset  . Pneumonia Mother   . Cancer Father   . Diabetes Sister   . Diabetes Sister   . Breast cancer Neg Hx     Allergies  Allergen Reactions  . Lexapro [Escitalopram Oxalate] Other (See Comments)    Dizziness, nausea, fatigue  . Neosporin  [Neomycin-Polymyxin-Gramicidin] Rash    (Neosporin)    Current Outpatient Medications on File Prior to Visit  Medication Sig Dispense Refill  . albuterol (PROVENTIL HFA;VENTOLIN HFA) 108 (90 Base) MCG/ACT inhaler INHALE 2 PUFFS BY MOUTH EVERY 4 HOURS AS NEEDED DX: J44.9 8.5 Inhaler 2  . alendronate (FOSAMAX) 70 MG tablet Take 1 tablet by mouth once weekly on an empty stomach with water only. Do not lay flat for one hour. 12 tablet 3  . busPIRone (BUSPAR) 7.5 MG tablet Take 1 tablet (7.5 mg total) by mouth 2 (two) times daily. For anxiety. 180 tablet 1  . clopidogrel (PLAVIX) 75 MG tablet TAKE 1 TABLET BY MOUTH EVERY DAY 90 tablet 1  . Fluticasone-Umeclidin-Vilant (TRELEGY ELLIPTA) 100-62.5-25 MCG/INH AEPB Inhale 1 Inhaler into the lungs daily. 1 each 5  . gabapentin (NEURONTIN) 100 MG capsule Take 1 capsule (100 mg total) by mouth 3 (three) times daily. 90 capsule 3  . glipiZIDE (GLUCOTROL XL) 5 MG 24 hr tablet TAKE 1 TABLET BY MOUTH ONCE DAILY WITH BREAKFAST FOR DIABETES. 90 tablet 1  . ipratropium-albuterol (DUONEB) 0.5-2.5 (3) MG/3ML SOLN Take 7mLs by nebulization every 6 (six) hours and  prn. Dx:J44.9 360 mL 5  . losartan (COZAAR) 100 MG tablet TAKE 1 TABLET BY MOUTH EVERY DAY 90 tablet 1  . Melatonin 5 MG TABS Take 5 mg by mouth at bedtime.     . Multiple Vitamin (MULTIVITAMIN) capsule Take 1 capsule by mouth daily.    . ondansetron (ZOFRAN ODT) 4 MG disintegrating tablet Take 1 tablet (4 mg total) by mouth every 8 (eight) hours as needed for nausea or vomiting. 20 tablet 0  . ONE TOUCH LANCETS MISC 1 each by Other route 2 (two) times daily. 200 each 1  . ONE TOUCH ULTRA TEST test strip USE 1 TWICE  A DAY AS INSTRUCTED 100 each 5  . OXYGEN 2 L continuous.     . rosuvastatin (CRESTOR) 5 MG tablet TAKE 1 TABLET BY MOUTH EVERY DAY IN THE EVENING 90 tablet 0  . traZODone (DESYREL) 50 MG tablet TAKE 1/2 TO 1 TABLET BY MOUTH AT BEDTIME FOR SLEEP. 90 tablet 0   No current facility-administered medications on file prior to visit.     BP 134/62 (BP Location: Right Arm, Patient Position: Sitting, Cuff Size: Normal)   Pulse (!) 102   Temp 97.9 F (36.6 C) (Oral)   Wt 116 lb (52.6 kg)   SpO2 91% Comment: with O2  BMI 21.22 kg/m    Objective:   Physical Exam  Constitutional: She is oriented to person, place, and time. She appears well-nourished.  Neck: Neck supple.  Cardiovascular: Normal rate and regular rhythm.  Respiratory: Effort normal and breath sounds normal.  Neurological: She is alert and oriented to person, place, and time. No cranial nerve deficit.  Residual right upper extremity weakness from prior stroke. Clear speech. Positive Romberg. Unsteady gait when rising from seated position, ambulatory down hallway without difficulty mostly.   Skin: Skin is warm and dry.           Assessment & Plan:

## 2018-12-14 NOTE — Patient Instructions (Signed)
Stop by the front desk and speak with either Rosaria Ferries or Anastasiya regarding your CT scan.   Please go to the hospital if your symptoms progress.   It was a pleasure to see you today!

## 2018-12-14 NOTE — Assessment & Plan Note (Addendum)
Chronic. Improved initially after surgery to VP shunt in early December 2019, returned 3-4 days ago. Exam today is concerning given recent surgical intervention and history of stroke. Neurosurgery called, they are unable to take our call, we left a message with requests to call us back with recommendations. She needs imaging of her head, stat CT head ordered. She does appear stable for outpatient treatment.  Check labs today including A1C, BMP, CBC. Strict ED precautions provided.

## 2018-12-24 ENCOUNTER — Telehealth: Payer: Self-pay

## 2018-12-24 NOTE — Telephone Encounter (Signed)
Donalynn Furlong PT with [facility] request verbal orders for [facility] PT 2 x a wk for 4 wks.

## 2018-12-24 NOTE — Telephone Encounter (Signed)
Approved.  

## 2018-12-25 IMAGING — MG DIGITAL SCREENING BILATERAL MAMMOGRAM WITH CAD
4 series · 4 of 4 positions shown · non-contrast
Comparison: None.

Addendum:
CLINICAL DATA: Screening.

EXAM:
DIGITAL SCREENING BILATERAL MAMMOGRAM WITH CAD

[L CC]
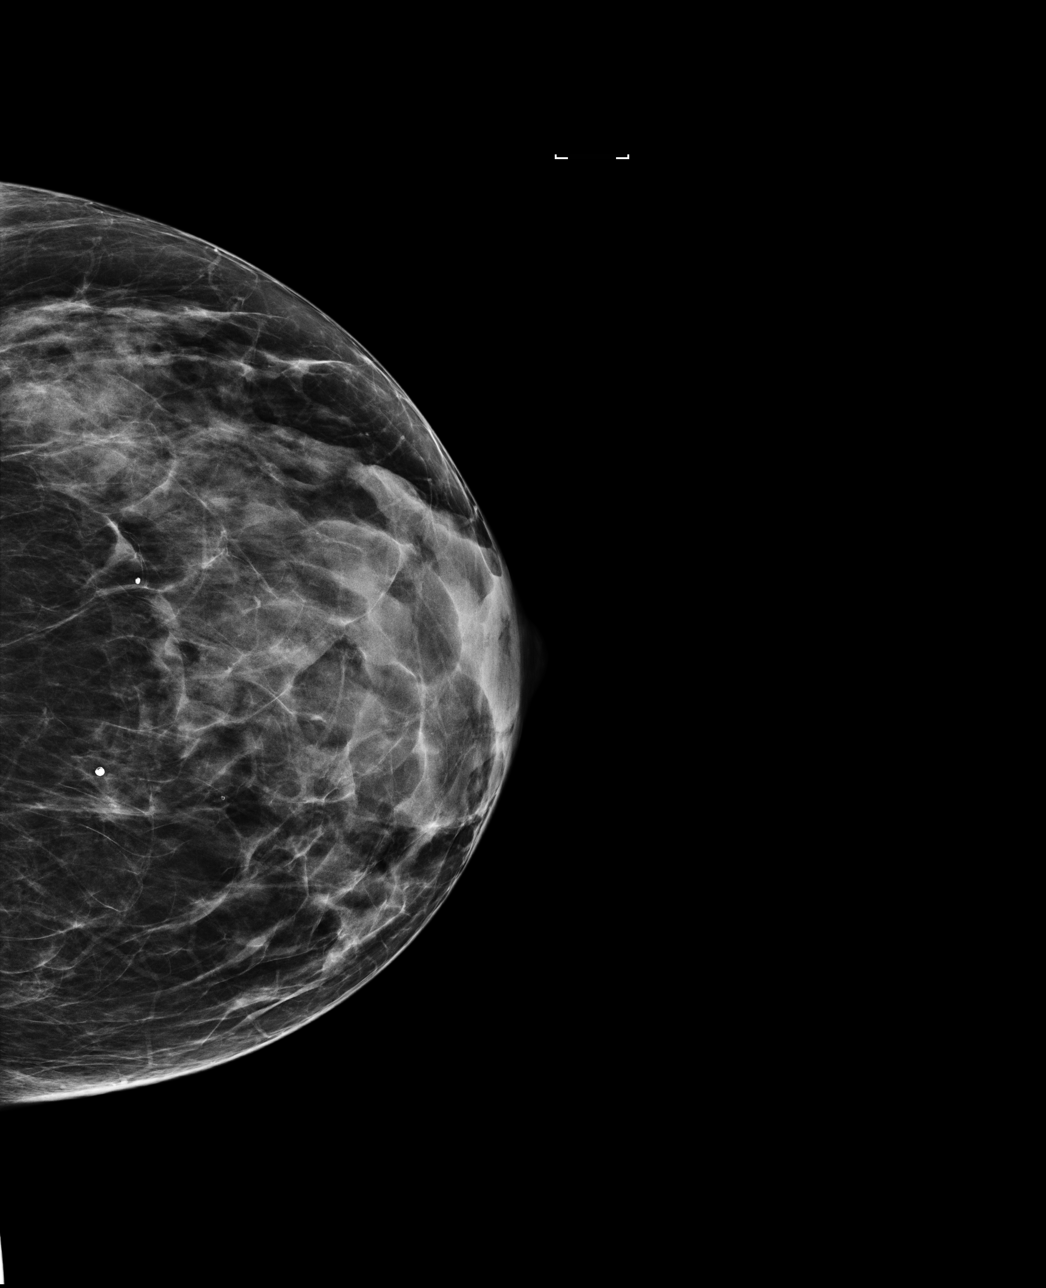

[L MLO]
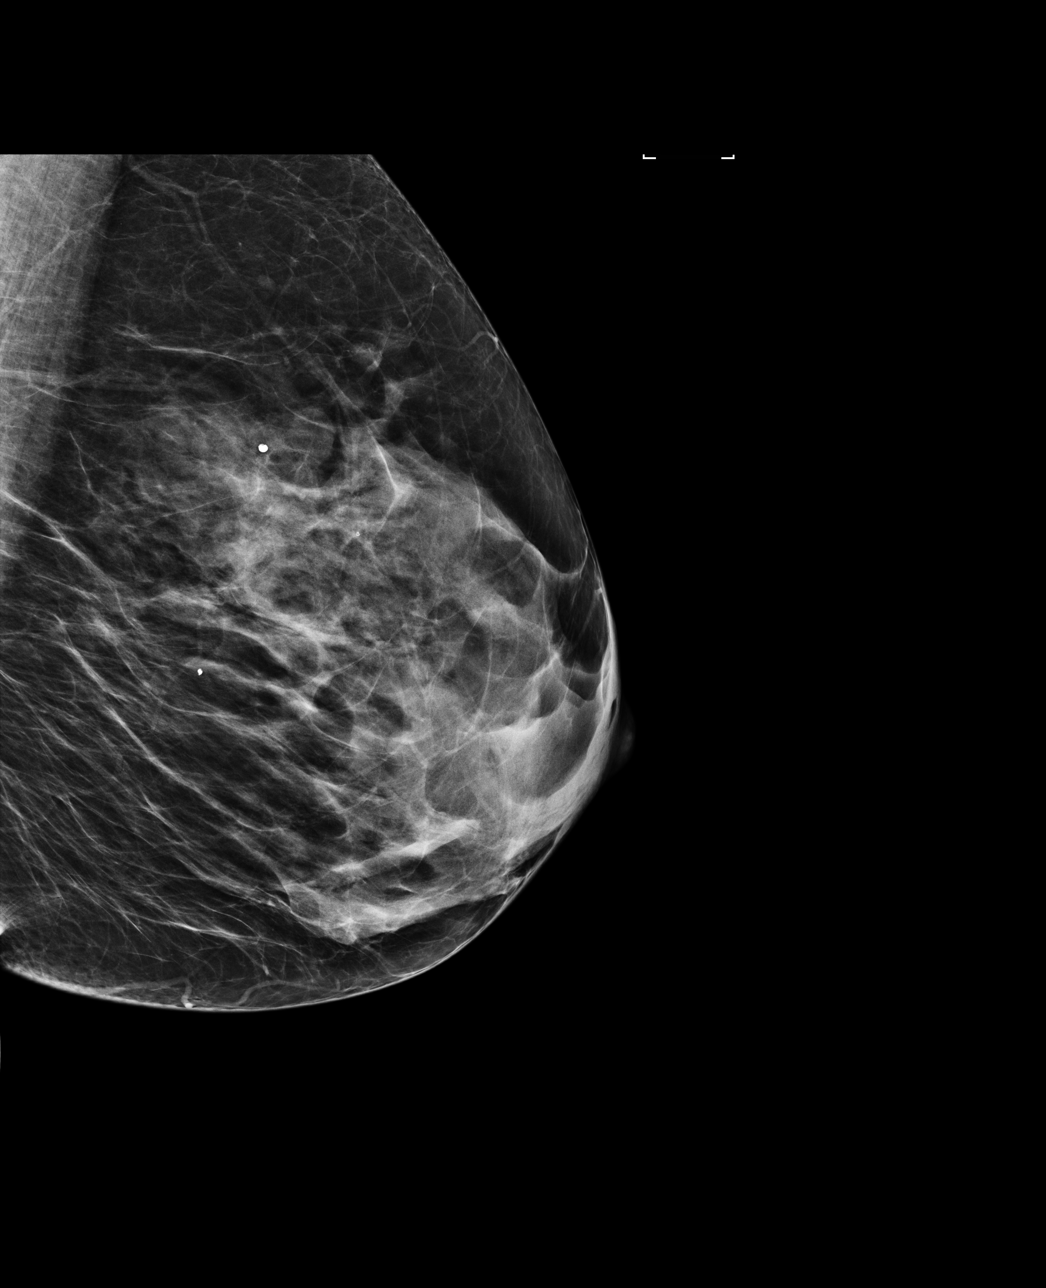

[R CC]
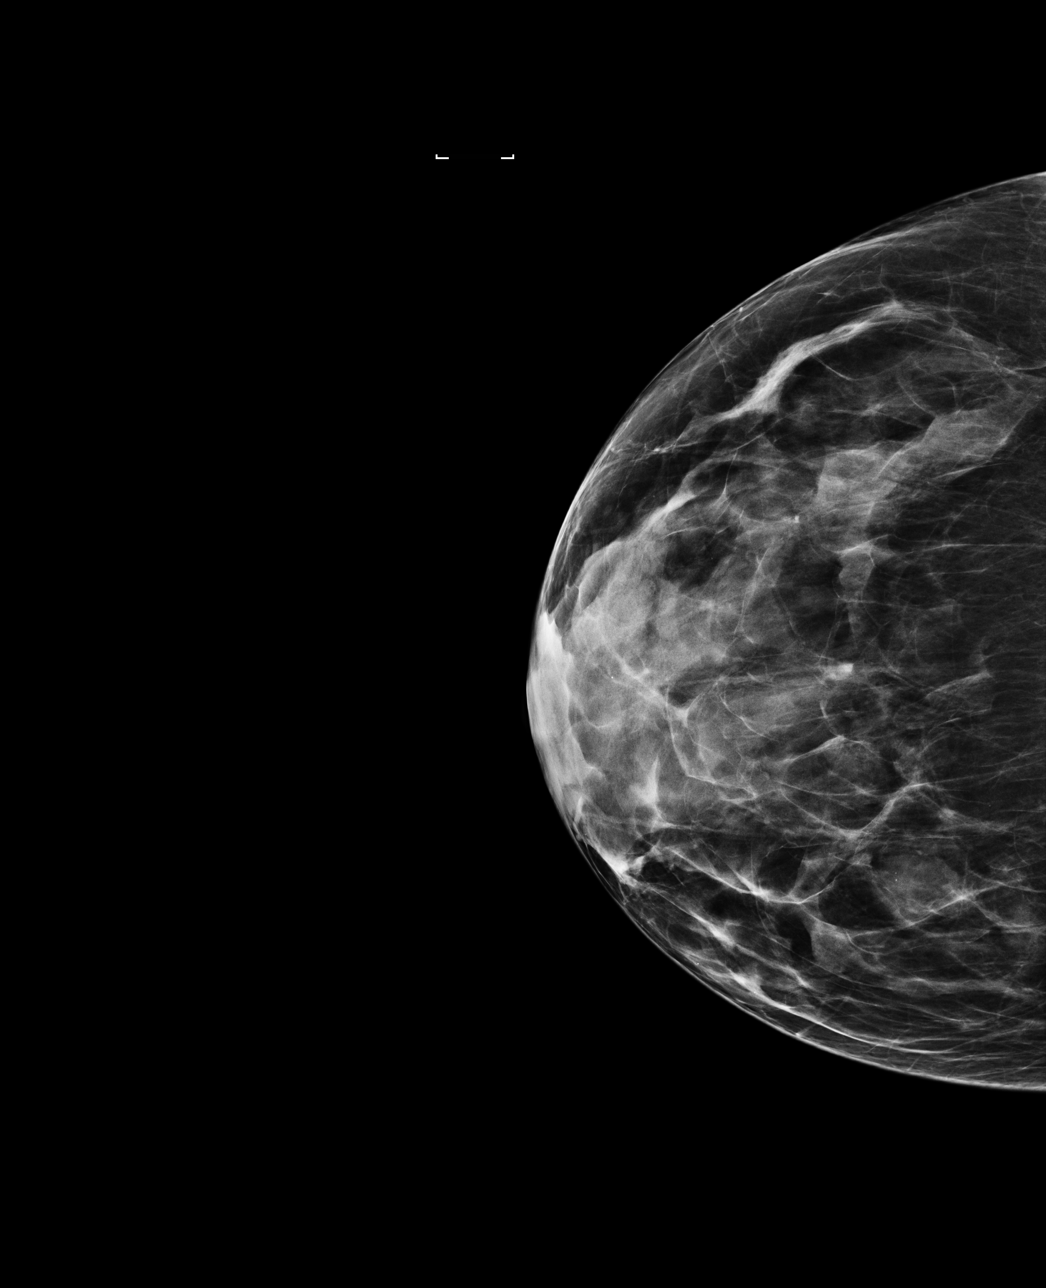

[R MLO]
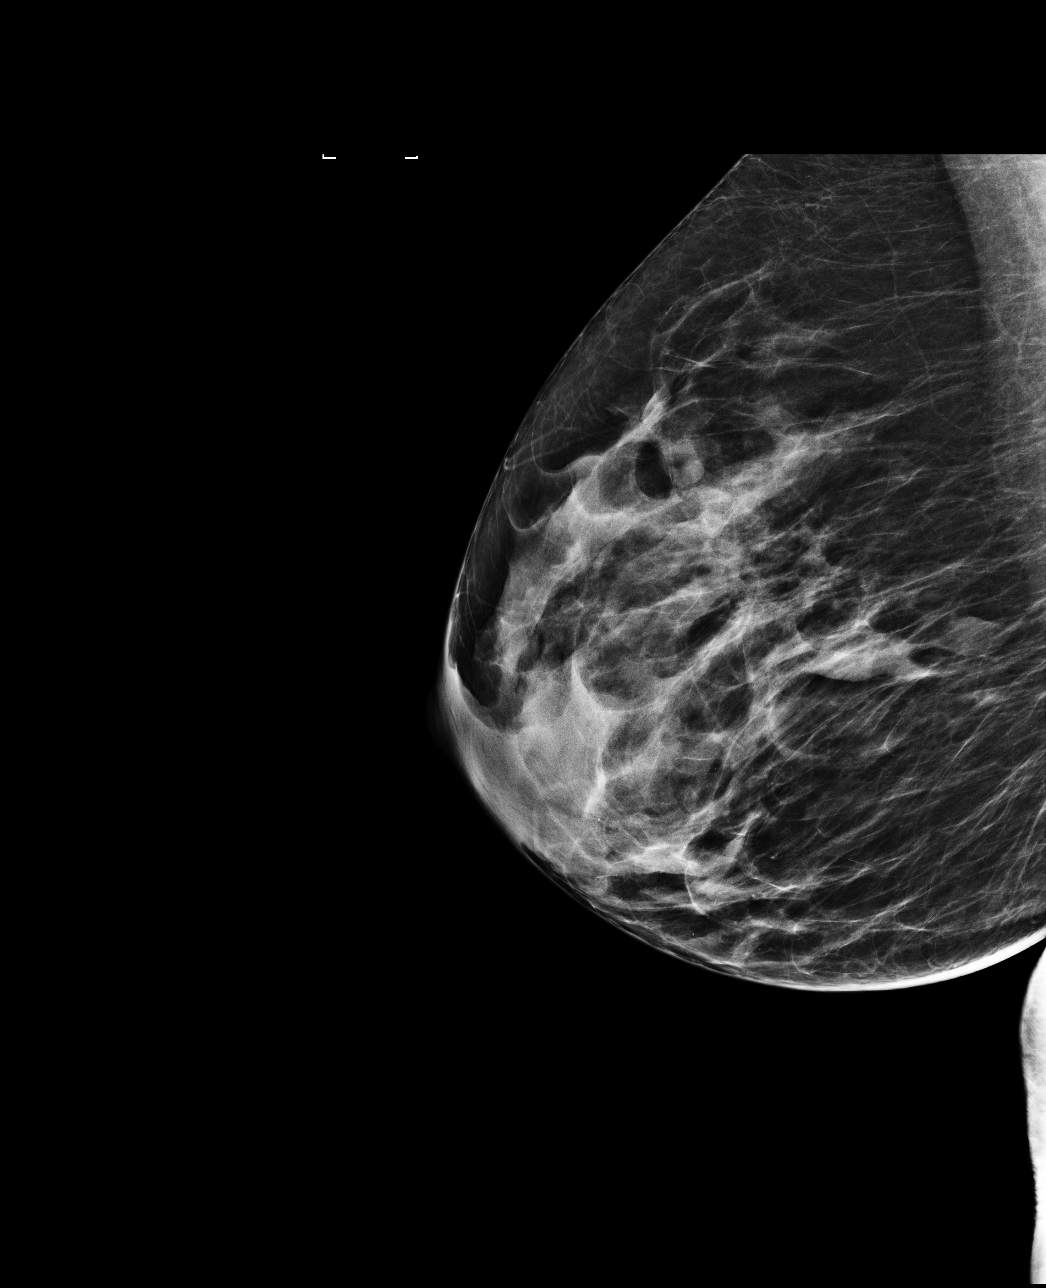

[4 of 4 positions shown; findings below may reference images not displayed]

ACR Breast Density Category c: The breast tissue is heterogeneously
dense, which may obscure small masses.
FINDINGS: In the right breast possible asymmetry requires further evaluation.

In the left breast possible asymmetry requires further evaluation.

Images were processed with CAD.
IMPRESSION: Further evaluation is suggested for possible asymmetry in the right
breast.

Further evaluation is suggested for possible asymmetry in the left
breast.

RECOMMENDATION:
Diagnostic mammogram and possibly ultrasound of both breasts.
(Code:[id number])

The patient will be contacted regarding the findings, and additional
imaging will be scheduled.

BI-RADS CATEGORY  0: Incomplete. Need additional imaging evaluation
and/or prior mammograms for comparison.

ADDENDUM:
Prior films dated 03/23/2017, 12/13/2015 and 12/06/2014 have become
available for comparison. Further evaluation for possible
asymmetries in both breast is recommended.

Recommendation: Diagnostic mammogram and possible ultrasound of both
breast is recommended.

BI-RADS category: 0: Incomplete. Need additional imaging evaluation
and/or prior mammograms for comparison.

*** End of Addendum ***

## 2018-12-27 ENCOUNTER — Telehealth: Payer: Self-pay | Admitting: Oncology

## 2018-12-27 NOTE — Telephone Encounter (Signed)
Rescheduled 12/06 appointment per patient. Spoke with patients daughter. Rescheduled for 03/03

## 2018-12-27 NOTE — Telephone Encounter (Signed)
Opened in error

## 2018-12-27 NOTE — Telephone Encounter (Signed)
Gave the approval for the verbal orders 

## 2019-01-20 ENCOUNTER — Telehealth: Payer: Self-pay

## 2019-01-20 NOTE — Telephone Encounter (Signed)
Noted and approved

## 2019-01-20 NOTE — Telephone Encounter (Signed)
Donalynn Furlong PT with [facility] request verbal orders to continue PT 2 x a wk for 1 wk and then plans to discharge pt next wk.

## 2019-01-21 NOTE — Telephone Encounter (Signed)
Gave the approval for the veberal orders.

## 2019-01-26 DIAGNOSIS — J449 Chronic obstructive pulmonary disease, unspecified: Secondary | ICD-10-CM

## 2019-01-26 MED ORDER — TIOTROPIUM BROMIDE MONOHYDRATE 18 MCG IN CAPS: 18.0000 ug | ORAL_CAPSULE | Freq: Every day | RESPIRATORY_TRACT | 0 refills | Status: DC

## 2019-02-01 ENCOUNTER — Inpatient Hospital Stay: Payer: Medicare Other

## 2019-02-01 ENCOUNTER — Telehealth: Payer: Self-pay | Admitting: Oncology

## 2019-02-01 ENCOUNTER — Inpatient Hospital Stay: Payer: Medicare Other | Attending: Oncology | Admitting: Oncology

## 2019-02-01 VITALS — BP 142/68 | HR 66 | Temp 98.4°F | Resp 17 | Ht 62.0 in | Wt 117.7 lb

## 2019-02-01 DIAGNOSIS — D509 Iron deficiency anemia, unspecified: Secondary | ICD-10-CM | POA: Diagnosis not present

## 2019-02-01 LAB — CBC WITH DIFFERENTIAL (CANCER CENTER ONLY)
Abs Immature Granulocytes: 0.03 10*3/uL (ref 0.00–0.07)
Basophils Absolute: 0.1 10*3/uL (ref 0.0–0.1)
Basophils Relative: 1 %
Eosinophils Absolute: 0.2 10*3/uL (ref 0.0–0.5)
Eosinophils Relative: 3 %
HCT: 36.2 % (ref 36.0–46.0)
Hemoglobin: 11.5 g/dL — ABNORMAL LOW (ref 12.0–15.0)
Immature Granulocytes: 0 %
Lymphocytes Relative: 37 %
Lymphs Abs: 3.2 10*3/uL (ref 0.7–4.0)
MCH: 32 pg (ref 26.0–34.0)
MCHC: 31.8 g/dL (ref 30.0–36.0)
MCV: 100.8 fL — ABNORMAL HIGH (ref 80.0–100.0)
Monocytes Absolute: 0.9 10*3/uL (ref 0.1–1.0)
Monocytes Relative: 10 %
Neutro Abs: 4.2 10*3/uL (ref 1.7–7.7)
Neutrophils Relative %: 49 %
PLATELETS: 309 10*3/uL (ref 150–400)
RBC: 3.59 MIL/uL — AB (ref 3.87–5.11)
RDW: 12.2 % (ref 11.5–15.5)
WBC: 8.6 10*3/uL (ref 4.0–10.5)
nRBC: 0 % (ref 0.0–0.2)

## 2019-02-01 LAB — IRON AND TIBC
Iron: 56 ug/dL (ref 41–142)
Saturation Ratios: 19 % — ABNORMAL LOW (ref 21–57)
TIBC: 293 ug/dL (ref 236–444)
UIBC: 237 ug/dL (ref 120–384)

## 2019-02-01 NOTE — Progress Notes (Signed)
Hematology and Oncology Follow Up Visit  Amanda Porter [id number] 1946/05/24 73 y.o. 02/01/2019 12:14 PM Amanda Porter, Amanda Penna, NP   Principle Diagnosis: 73 year old woman with iron deficiency anemia diagnosed in May 2019.  Presented with a hemoglobin of 9 and iron of 19.  Iron deficiency is related to chronic blood losses with GI work-up did not reveal any colon sources.   Prior Therapy: Status post Feraheme infusion given in August 2019.  She received a total of 1000 mg at that time.  Current therapy: Oral iron replacement on a daily basis.  Interim History: Amanda Porter returns today for repeat evaluation.  She is a pleasant woman I saw in consultation last year for iron deficiency anemia.  She received intravenous iron at the time with improvement in her hemoglobin up to 12.  She underwent shunt revision surgery and missed her last appointment because of it.  She has reported some mild fatigue as of late and was started on oral iron therapy which he takes orally on a regular basis.  She denies any hematochezia, melena or epistaxis.  Her performance status quality of life remains unchanged.  She is oxygen dependent which has affected her exercise tolerance.  Patient denied any alteration mental status, neuropathy, confusion or dizziness.  Denies any headaches or lethargy.  Denies any night sweats, weight loss or changes in appetite.  Denied orthopnea, dyspnea on exertion or chest discomfort.  Denies shortness of breath, difficulty breathing hemoptysis or cough.  Denies any abdominal distention, nausea, early satiety or dyspepsia.  Denies any hematuria, frequency, dysuria or nocturia.  Denies any skin irritation, dryness or rash.  Denies any ecchymosis or petechiae.  Denies any lymphadenopathy or clotting.  Denies any heat or cold intolerance.  Denies any anxiety or depression.  Remaining review of system is negative.    Medications: I have reviewed the patient's current  medications.  Current Outpatient Medications  Medication Sig Dispense Refill  . albuterol (PROVENTIL HFA;VENTOLIN HFA) 108 (90 Base) MCG/ACT inhaler INHALE 2 PUFFS BY MOUTH EVERY 4 HOURS AS NEEDED DX: J44.9 8.5 Inhaler 2  . alendronate (FOSAMAX) 70 MG tablet Take 1 tablet by mouth once weekly on an empty stomach with water only. Do not lay flat for one hour. 12 tablet 3  . busPIRone (BUSPAR) 15 MG tablet Take 1 tablet (15 mg total) by mouth 2 (two) times daily. 180 tablet 0  . clopidogrel (PLAVIX) 75 MG tablet TAKE 1 TABLET BY MOUTH EVERY DAY 90 tablet 1  . gabapentin (NEURONTIN) 100 MG capsule Take 1 capsule (100 mg total) by mouth 3 (three) times daily. 90 capsule 3  . glipiZIDE (GLUCOTROL XL) 5 MG 24 hr tablet TAKE 1 TABLET BY MOUTH ONCE DAILY WITH BREAKFAST FOR DIABETES. 90 tablet 1  . ipratropium-albuterol (DUONEB) 0.5-2.5 (3) MG/3ML SOLN Take 81mLs by nebulization every 6 (six) hours and prn. Dx:J44.9 360 mL 5  . losartan (COZAAR) 100 MG tablet TAKE 1 TABLET BY MOUTH EVERY DAY 90 tablet 1  . Melatonin 5 MG TABS Take 5 mg by mouth at bedtime.     . Multiple Vitamin (MULTIVITAMIN) capsule Take 1 capsule by mouth daily.    . ondansetron (ZOFRAN ODT) 4 MG disintegrating tablet Take 1 tablet (4 mg total) by mouth every 8 (eight) hours as needed for nausea or vomiting. 20 tablet 0  . ONE TOUCH LANCETS MISC 1 each by Other route 2 (two) times daily. 200 each 1  . ONE TOUCH ULTRA TEST test  strip USE 1 TWICE A DAY AS INSTRUCTED 100 each 5  . OXYGEN 2 L continuous.     . rosuvastatin (CRESTOR) 5 MG tablet TAKE 1 TABLET BY MOUTH EVERY DAY IN THE EVENING 90 tablet 0  . tiotropium (SPIRIVA HANDIHALER) 18 MCG inhalation capsule Place 1 capsule (18 mcg total) into inhaler and inhale daily. 30 capsule 0  . traZODone (DESYREL) 50 MG tablet TAKE 1/2 TO 1 TABLET BY MOUTH AT BEDTIME FOR SLEEP. 90 tablet 0   No current facility-administered medications for this visit.      Allergies:  Allergies  Allergen  Reactions  . Lexapro [Escitalopram Oxalate] Other (See Comments)    Dizziness, nausea, fatigue  . Neosporin  [Neomycin-Polymyxin-Gramicidin] Rash    (Neosporin)    Past Medical History, Surgical history, Social history, and Family History were reviewed and updated.   Physical Exam: Blood pressure (!) 142/68, pulse 66, temperature 98.4 F (36.9 C), temperature source Oral, resp. rate 17, height 5\' 2"  (1.575 m), weight 117 lb 11.2 oz (53.4 kg), SpO2 95 %. ECOG: 1 General appearance: alert and cooperative appeared without distress. Head: Normocephalic, without obvious abnormality Oropharynx: No oral thrush or ulcers. Eyes: No scleral icterus.  Pupils are equal and round reactive to light. Lymph nodes: Cervical, supraclavicular, and axillary nodes normal. Heart:regular rate and rhythm, S1, S2 normal, no murmur, click, rub or gallop Lung:chest clear, no wheezing, rales, normal symmetric air entry Abdomin: soft, non-tender, without masses or organomegaly. Neurological: No motor, sensory deficits.  Intact deep tendon reflexes. Skin: No rashes or lesions.  No ecchymosis or petechiae. Musculoskeletal: No joint deformity or effusion. Psychiatric: Mood and affect are appropriate.    Lab Results: Lab Results  Component Value Date   WBC 8.6 02/01/2019   HGB 11.5 (L) 02/01/2019   HCT 36.2 02/01/2019   MCV 100.8 (H) 02/01/2019   PLT 309 02/01/2019     Chemistry      Component Value Date/Time   NA 141 12/14/2018 1455   K 3.9 12/14/2018 1455   CL 103 12/14/2018 1455   CO2 31 12/14/2018 1455   BUN 19 12/14/2018 1455   CREATININE 0.59 12/14/2018 1455      Component Value Date/Time   CALCIUM 10.2 12/14/2018 1455   ALKPHOS 61 09/15/2017 0815   AST 15 09/15/2017 0815   ALT 12 09/15/2017 0815   BILITOT 0.2 09/15/2017 0815          Impression and Plan:  73 year old woman with:  1.  Anemia diagnosed in May 2019 after presenting with Iron deficiency and a hemoglobin of 9 and  iron level of 19 spite oral iron therapy.  She is status post intravenous iron completed in August 2019 with improvement in her iron stores with iron level of 55 and normalization of her hemoglobin.  Her CBC was reviewed today and showed a hemoglobin of 11.5 and iron studies are currently pending.  Risks and benefits of repeat IV iron was discussed today.  These complications occluding fusion related complications including arthralgias or myalgias were reiterated.  She had no issues with the last iron infusion and she would be willing to receive it again if her iron levels are drifting.  For the time being I recommended continuing oral iron replacement and use IV iron if this maneuver is not successful.   2.  Age-appropriate cancer screening: She is up-to-date on her colonoscopy.  No signs symptoms to suggest any malignancy.   3.  Follow-up: We will be in 04 June 2019 for repeat evaluation.  15 minutes was spent with the patient face-to-face today.  More than 50% of time was spent on reviewing laboratory data, answering questions regarding plan of care and reviewing treatment options.     Zola Button, MD 3/3/202012:14 PM

## 2019-02-01 NOTE — Telephone Encounter (Signed)
Gave avs and calendar ° °

## 2019-02-01 NOTE — Addendum Note (Signed)
Addended by: Scot Dock on: 02/01/2019 12:40 PM   Modules accepted: Orders

## 2019-02-02 ENCOUNTER — Encounter (HOSPITAL_COMMUNITY): Payer: Self-pay | Admitting: Psychiatry

## 2019-02-02 ENCOUNTER — Encounter

## 2019-02-02 ENCOUNTER — Ambulatory Visit (INDEPENDENT_AMBULATORY_CARE_PROVIDER_SITE_OTHER): Payer: Medicare Other | Admitting: Psychiatry

## 2019-02-02 VITALS — BP 126/64 | HR 98 | Ht 61.75 in | Wt 117.8 lb

## 2019-02-02 DIAGNOSIS — G912 (Idiopathic) normal pressure hydrocephalus: Secondary | ICD-10-CM

## 2019-02-02 DIAGNOSIS — J449 Chronic obstructive pulmonary disease, unspecified: Secondary | ICD-10-CM | POA: Diagnosis not present

## 2019-02-02 DIAGNOSIS — Z9119 Patient's noncompliance with other medical treatment and regimen: Secondary | ICD-10-CM

## 2019-02-02 DIAGNOSIS — Z9981 Dependence on supplemental oxygen: Secondary | ICD-10-CM | POA: Diagnosis not present

## 2019-02-02 DIAGNOSIS — F4323 Adjustment disorder with mixed anxiety and depressed mood: Secondary | ICD-10-CM

## 2019-02-02 MED ORDER — HYDROXYZINE PAMOATE 25 MG PO CAPS: ORAL_CAPSULE | ORAL | 3 refills | Status: DC

## 2019-02-02 NOTE — Progress Notes (Signed)
Psychiatric Initial Adult Assessment   Patient Identification: Amanda Porter MRN:  [id number] Date of Evaluation:  02/02/2019 Referral Source: [facility] primary care Chief Complaint:   Visit Diagnosis: Adjustment disorder with anxiety  History of Present Illness:   This patient is a 73 year old African-American female who is seen today with her daughter [name].  The patient is diagnosed with normal pressure hydrocephalus and in December had her shunt replaced.  The initial shunt was placed in 2018.  At that time she moved from [place] to [place].  She said since she says since 2018 she has felt depressed every day.  It is no better no worse.  She has interrupted sleep and that she wakes early in the morning.  She does not take naps she is not sleepy.  Therefore she shows no evidence of dysfunction from her sleep problem which is been chronic.  She is eating fairly well has good energy and has no problems thinking and concentrating.  She is not suicidal now and never has been.  She denies the use of alcohol or drugs.  She has never had psychosis.  There is no evidence of clear episode of major depression despite the fact that she is tried over half a dozen antidepressants without any success.  The patient has no evidence of mania.  In close evaluation she denies symptoms consistent with generalized anxiety disorder panic disorder or obsessive-compulsive disorder.  Over the years she has been on Xanax and only in the last year or so has her primary care doctor gotten her off of it.  Apparently she tried multiple different antidepressants but never really took the Neurontin that was offered to her.  The patient lives with her daughter [name].  Patient is been divorced since 43.  The patient unfortunately had multiple deaths in the last 10 years.  She recently had the death of her nephew.  The patient has been retired since 2010.  She apparently had neuropsych testing that demonstrated dementia.  This is  in fact probably what are the symptoms of normal pressure hydrocephalus.  The patient denies ever really having incontinence.  She is never really had trouble with balance.  Her significant medical illnesses COPD and she is oxygen dependent.  She declines describes chronic mild anxiety.  She is never had a psychiatric hospitalization.  She has been in therapy in the past but it is unclear if it was helpful. Associated Signs/Symptoms: Depression Symptoms:  anxiety, (Hypo) Manic Symptoms:   Anxiety Symptoms:  Excessive Worry, Psychotic Symptoms:   PTSD Symptoms:   Past Psychiatric History: Multiple antidepressants, SSRIs Remeron  Previous Psychotropic Medications:   Substance Abuse History in the last 12 months:    Consequences of Substance Abuse:   Past Medical History:  Past Medical History:  Diagnosis Date  . Anxiety   . Anxiety and depression   . COPD (chronic obstructive pulmonary disease) ([place])   . CVA (cerebral vascular accident) ([place])   . Dementia ([place])   . Depression   . Diabetes mellitus without complication ([place])   . Dysphasia   . History of lung cancer   . Hyperlipidemia   . Memory disturbance 11/20/2017  . Normal pressure hydrocephalus ([place]) 2018  . Retinal detachment    Right  . Right carotid bruit 11/20/2017  . Thrombosis    Arterial to lower extremity?    Past Surgical History:  Procedure Laterality Date  . LUNG REMOVAL, PARTIAL     left upper lobe  . VENTRICULOPERITONEAL SHUNT  12/09/2016    Family Psychiatric History:   Family History:  Family History  Problem Relation Age of Onset  . Pneumonia Mother   . Cancer Father   . Diabetes Sister   . Diabetes Sister   . Breast cancer Neg Hx     Social History:   Social History   Socioeconomic History  . Marital status: Divorced    Spouse name: Not on file  . Number of children: 1  . Years of education: Not on file  . Highest education level: Not on file  Occupational History  . Occupation:  Retired  Scientific laboratory technician  . Financial resource strain: Not on file  . Food insecurity:    Worry: Not on file    Inability: Not on file  . Transportation needs:    Medical: Not on file    Non-medical: Not on file  Tobacco Use  . Smoking status: Former Smoker    Packs/day: 0.50    Years: 50.00    Pack years: 25.00    Last attempt to quit: 08/01/2017    Years since quitting: 1.5  . Smokeless tobacco: Never Used  Substance and Sexual Activity  . Alcohol use: No  . Drug use: No  . Sexual activity: Not Currently  Lifestyle  . Physical activity:    Days per week: Not on file    Minutes per session: Not on file  . Stress: Not on file  Relationships  . Social connections:    Talks on phone: Not on file    Gets together: Not on file    Attends religious service: Not on file    Active member of club or organization: Not on file    Attends meetings of clubs or organizations: Not on file    Relationship status: Not on file  Other Topics Concern  . Not on file  Social History Narrative   Moved from [place].   Lives with daughter.   Right handed    Additional Social History:   Allergies:   Allergies  Allergen Reactions  . Lexapro [Escitalopram Oxalate] Other (See Comments)    Dizziness, nausea, fatigue  . Neosporin  [Neomycin-Polymyxin-Gramicidin] Rash    (Neosporin)    Metabolic Disorder Labs: Lab Results  Component Value Date   HGBA1C 6.9 (H) 12/14/2018   No results found for: PROLACTIN Lab Results  Component Value Date   CHOL 157 09/15/2017   TRIG 105.0 09/15/2017   HDL 58.80 09/15/2017   CHOLHDL 3 09/15/2017   VLDL 21.0 09/15/2017   LDLCALC 77 09/15/2017   No results found for: TSH  Therapeutic Level Labs: No results found for: LITHIUM No results found for: CBMZ No results found for: VALPROATE  Current Medications: Current Outpatient Medications  Medication Sig Dispense Refill  . albuterol (PROVENTIL HFA;VENTOLIN HFA) 108 (90 Base) MCG/ACT inhaler INHALE 2  PUFFS BY MOUTH EVERY 4 HOURS AS NEEDED DX: J44.9 8.5 Inhaler 2  . albuterol (PROVENTIL) (2.5 MG/3ML) 0.083% nebulizer solution Take 3 mLs by nebulization as needed.    Marland Kitchen alendronate (FOSAMAX) 70 MG tablet Take 1 tablet by mouth once weekly on an empty stomach with water only. Do not lay flat for one hour. 12 tablet 3  . busPIRone (BUSPAR) 15 MG tablet Take 1 tablet (15 mg total) by mouth 2 (two) times daily. 180 tablet 0  . clopidogrel (PLAVIX) 75 MG tablet TAKE 1 TABLET BY MOUTH EVERY DAY 90 tablet 1  . losartan (COZAAR) 100 MG tablet TAKE 1 TABLET  BY MOUTH EVERY DAY 90 tablet 1  . Melatonin 5 MG TABS Take 5 mg by mouth at bedtime.     . Multiple Vitamin (MULTIVITAMIN) capsule Take 1 capsule by mouth daily.    . ONE TOUCH LANCETS MISC 1 each by Other route 2 (two) times daily. 200 each 1  . ONE TOUCH ULTRA TEST test strip USE 1 TWICE A DAY AS INSTRUCTED 100 each 5  . OXYGEN 2 L continuous.     . rosuvastatin (CRESTOR) 5 MG tablet TAKE 1 TABLET BY MOUTH EVERY DAY IN THE EVENING 90 tablet 0  . tiotropium (SPIRIVA HANDIHALER) 18 MCG inhalation capsule Place 1 capsule (18 mcg total) into inhaler and inhale daily. 30 capsule 0  . hydrOXYzine (VISTARIL) 25 MG capsule 1  Bid  1  Or 2  q day prn  For anxiety 120 capsule 3  . ondansetron (ZOFRAN ODT) 4 MG disintegrating tablet Take 1 tablet (4 mg total) by mouth every 8 (eight) hours as needed for nausea or vomiting. (Patient not taking: Reported on 02/01/2019) 20 tablet 0   No current facility-administered medications for this visit.     Musculoskeletal: Strength & Muscle Tone: decreased Gait & Station: normal Patient leans: N/A  Psychiatric Specialty Exam: ROS  Blood pressure 126/64, pulse 98, height 5' 1.75" (1.568 m), weight 117 lb 12.8 oz (53.4 kg), SpO2 92 %.Body mass index is 21.72 kg/m.  General Appearance: Casual  Eye Contact:  Good  Speech:  Clear and Coherent  Volume:  Normal  Mood:  Anxious  Affect:  Appropriate  Thought Process:   Coherent  Orientation:  NA  Thought Content:  Logical  Suicidal Thoughts:  No  Homicidal Thoughts:  No  Memory:  Negative  Judgement:  Good  Insight:  Fair  Psychomotor Activity:  Normal  Concentration:    Recall:  [place] of Knowledge:Fair  Language: Fair  Akathisia:  No  Handed:  Right  AIMS (if indicated):  not done  Assets:  Desire for Improvement  ADL's:  Intact  Cognition: Impaired,  Mild  Sleep:  Good   Screenings: GAD-7     Office Visit from 02/18/2018 in [place] at [facility] Visit from 11/26/2017 in [place] at [facility]  Total GAD-7 Score  15  15    Mini-Mental     Office Visit from 07/30/2018 in [facility] Office Visit from 11/20/2017 in [facility]  Total Score (max 30 points )  25  27    PHQ2-9     Nutrition from 03/26/2018 in [facility] from 03/02/2018 in [facility]  PHQ-2 Total Score  6  6  PHQ-9 Total Score  19  18      Assessment and Plan: \  At this time the patient's major problem seems to be complaints of anxiety.  I think she has had trouble adjusting to the community.  Her source of anxiety is not clear.  There is no new significant psychosocial stressor.  The only exception is that this is the idea that she just recently had her shunt replaced in December.  Patient is tried multiple antidepressants including Zoloft Effexor Lexapro and Cymbalta.  She is never been on Vistaril.  At this time our interventions is to refer her back to the therapist that she saw in the past Mrs. Cottle.  The patient also will begin on Vistaril taking 25 mg twice daily and 1  or 2 extra as needed for anxiety.  The daughter [name] will in fact get her neuropsych testing that was done for her a year ago.  The possibility of using higher doses of Vistaril will certainly be considered.  Patient apparently is very noncompliant.  The possibility of adding  Neurontin is not out of the question.  She is never really given a trial.  Patient will return to see me in 2 months.  Patient certainly is not suicidal and she is functioning fairly well with assistance of her daughter [name]. Jerral Ralph, MD 3/4/20204:36 PM

## 2019-02-14 ENCOUNTER — Telehealth: Payer: Self-pay

## 2019-02-14 ENCOUNTER — Institutional Professional Consult (permissible substitution): Payer: Self-pay | Admitting: Emergency Medicine

## 2019-02-14 NOTE — Telephone Encounter (Signed)
Pt was to establish with [facility] today but due to pt being exposed to flu cancelled appt and rescheduled 02/24/19. Today pts granddaughter (all live together) dx with flu and strep. For 2 days pt has had body aches; productive cough with yellow green phlegm; increased SOB due to COPD worsening; pt using albuterol neb treatments 3 - 4 x a day instead of once daily as usual.no fever. pt has not traveled and has not come in contact with person with + covid. pts daughter scheduled appt with Gentry Fitz NP 02/15/19 at 2:20.Marland Kitchen if pt condition changes or worsens prior to appt [name] will cb during the day and if at night will take to [facility] or ED. FYI to Gentry Fitz NP.

## 2019-02-14 NOTE — Telephone Encounter (Signed)
Noted  

## 2019-02-15 ENCOUNTER — Encounter: Payer: Self-pay | Admitting: Primary Care

## 2019-02-15 ENCOUNTER — Other Ambulatory Visit: Payer: Self-pay

## 2019-02-15 ENCOUNTER — Ambulatory Visit: Payer: Medicare Other | Admitting: Primary Care

## 2019-02-15 VITALS — BP 140/76 | HR 85 | Temp 98.1°F | Ht 62.0 in | Wt 117.5 lb

## 2019-02-15 DIAGNOSIS — J441 Chronic obstructive pulmonary disease with (acute) exacerbation: Secondary | ICD-10-CM | POA: Diagnosis not present

## 2019-02-15 DIAGNOSIS — Z20828 Contact with and (suspected) exposure to other viral communicable diseases: Secondary | ICD-10-CM | POA: Diagnosis not present

## 2019-02-15 LAB — POC INFLUENZA A&B (BINAX/QUICKVUE)
Influenza A, POC: NEGATIVE
Influenza B, POC: NEGATIVE

## 2019-02-15 MED ORDER — AZITHROMYCIN 250 MG PO TABS: ORAL_TABLET | ORAL | 0 refills | Status: DC

## 2019-02-15 MED ORDER — PREDNISONE 20 MG PO TABS: ORAL_TABLET | ORAL | 0 refills | Status: DC

## 2019-02-15 MED ORDER — OSELTAMIVIR PHOSPHATE 75 MG PO CAPS: 75.0000 mg | ORAL_CAPSULE | Freq: Every day | ORAL | 0 refills | Status: DC

## 2019-02-15 NOTE — Progress Notes (Signed)
Subjective:    Patient ID: Amanda Porter, female    DOB: May 01, 1946, 73 y.o.   MRN: [id number]  HPI  Amanda Porter is a 73 year old female with a history of COPD, Type 2 diabetes, anxiety, dementia who presents today with a chief complaint of cough.  She also reports body aches, increased shortness of breath, increased sputum production, productive cough with brownish sputum. Her symptoms began three days ago. She denies fevers.   She's been exposed to influenza and strep pharyngitis by her granddaughter that was diagnosed with both yesterday.   She's taking Mucinex cough syrup, albuterol, and Tylenol. She had her flu shot this year.   Review of Systems  Constitutional: Negative for chills and fever.  HENT: Positive for congestion and postnasal drip. Negative for sore throat.   Respiratory: Positive for cough and shortness of breath.   Cardiovascular: Negative for chest pain.       Past Medical History:  Diagnosis Date  . Anxiety   . Anxiety and depression   . COPD (chronic obstructive pulmonary disease) ([place])   . CVA (cerebral vascular accident) ([place])   . Dementia ([place])   . Depression   . Diabetes mellitus without complication ([place])   . Dysphasia   . History of lung cancer   . Hyperlipidemia   . Memory disturbance 11/20/2017  . Normal pressure hydrocephalus ([place]) 2018  . Retinal detachment    Right  . Right carotid bruit 11/20/2017  . Thrombosis    Arterial to lower extremity?     Social History   Socioeconomic History  . Marital status: Divorced    Spouse name: Not on file  . Number of children: 1  . Years of education: Not on file  . Highest education level: Not on file  Occupational History  . Occupation: Retired  Scientific laboratory technician  . Financial resource strain: Not on file  . Food insecurity:    Worry: Not on file    Inability: Not on file  . Transportation needs:    Medical: Not on file    Non-medical: Not on file  Tobacco Use  . Smoking status:  Former Smoker    Packs/day: 0.50    Years: 50.00    Pack years: 25.00    Last attempt to quit: 08/01/2017    Years since quitting: 1.5  . Smokeless tobacco: Never Used  Substance and Sexual Activity  . Alcohol use: No  . Drug use: No  . Sexual activity: Not Currently  Lifestyle  . Physical activity:    Days per week: Not on file    Minutes per session: Not on file  . Stress: Not on file  Relationships  . Social connections:    Talks on phone: Not on file    Gets together: Not on file    Attends religious service: Not on file    Active member of club or organization: Not on file    Attends meetings of clubs or organizations: Not on file    Relationship status: Not on file  . Intimate partner violence:    Fear of current or ex partner: Not on file    Emotionally abused: Not on file    Physically abused: Not on file    Forced sexual activity: Not on file  Other Topics Concern  . Not on file  Social History Narrative   Moved from [place].   Lives with daughter.   Right handed    Past Surgical History:  Procedure  Laterality Date  . LUNG REMOVAL, PARTIAL     left upper lobe  . VENTRICULOPERITONEAL SHUNT  12/09/2016    Family History  Problem Relation Age of Onset  . Pneumonia Mother   . Cancer Father   . Diabetes Sister   . Diabetes Sister   . Breast cancer Neg Hx     Allergies  Allergen Reactions  . Lexapro [Escitalopram Oxalate] Other (See Comments)    Dizziness, nausea, fatigue  . Neosporin  [Neomycin-Polymyxin-Gramicidin] Rash    (Neosporin)    Current Outpatient Medications on File Prior to Visit  Medication Sig Dispense Refill  . albuterol (PROVENTIL HFA;VENTOLIN HFA) 108 (90 Base) MCG/ACT inhaler INHALE 2 PUFFS BY MOUTH EVERY 4 HOURS AS NEEDED DX: J44.9 8.5 Inhaler 2  . albuterol (PROVENTIL) (2.5 MG/3ML) 0.083% nebulizer solution Take 3 mLs by nebulization as needed.    Marland Kitchen alendronate (FOSAMAX) 70 MG tablet Take 1 tablet by mouth once weekly on an empty  stomach with water only. Do not lay flat for one hour. 12 tablet 3  . busPIRone (BUSPAR) 15 MG tablet Take 1 tablet (15 mg total) by mouth 2 (two) times daily. 180 tablet 0  . clopidogrel (PLAVIX) 75 MG tablet TAKE 1 TABLET BY MOUTH EVERY DAY 90 tablet 1  . hydrOXYzine (VISTARIL) 25 MG capsule 1  Bid  1  Or 2  q day prn  For anxiety 120 capsule 3  . losartan (COZAAR) 100 MG tablet TAKE 1 TABLET BY MOUTH EVERY DAY 90 tablet 1  . Melatonin 5 MG TABS Take 5 mg by mouth at bedtime.     . Multiple Vitamin (MULTIVITAMIN) capsule Take 1 capsule by mouth daily.    . ondansetron (ZOFRAN ODT) 4 MG disintegrating tablet Take 1 tablet (4 mg total) by mouth every 8 (eight) hours as needed for nausea or vomiting. 20 tablet 0  . ONE TOUCH LANCETS MISC 1 each by Other route 2 (two) times daily. 200 each 1  . ONE TOUCH ULTRA TEST test strip USE 1 TWICE A DAY AS INSTRUCTED 100 each 5  . OXYGEN 2 L continuous.     . rosuvastatin (CRESTOR) 5 MG tablet TAKE 1 TABLET BY MOUTH EVERY DAY IN THE EVENING 90 tablet 0  . tiotropium (SPIRIVA HANDIHALER) 18 MCG inhalation capsule Place 1 capsule (18 mcg total) into inhaler and inhale daily. 30 capsule 0   No current facility-administered medications on file prior to visit.     BP 140/76   Pulse 85   Temp 98.1 F (36.7 C) (Oral)   Ht 5\' 2"  (1.575 m)   Wt 117 lb 8 oz (53.3 kg)   SpO2 97%   BMI 21.49 kg/m    Objective:   Physical Exam  Constitutional: She appears well-nourished. She appears ill.  HENT:  Right Ear: Tympanic membrane and ear canal normal.  Left Ear: Tympanic membrane and ear canal normal.  Nose: No mucosal edema. Right sinus exhibits no maxillary sinus tenderness and no frontal sinus tenderness. Left sinus exhibits no maxillary sinus tenderness and no frontal sinus tenderness.  Mouth/Throat: Oropharynx is clear and moist.  Neck: Neck supple.  Cardiovascular: Normal rate and regular rhythm.  Respiratory: Effort normal and breath sounds normal. She  has no wheezes.  Skin: Skin is warm and dry.           Assessment & Plan:  COPD Exacerbation:  Increased sputum production, purulent sputum, increased SOB. Also exposed to influenza and strep pharyngitis.  Exam  today consistent for COPD exacerbation, also at risk for influenza. Negative for influenza today. Treat with Zpak, prednisone burst, Tamiflu. Return precautions provided.  Pleas Koch, NP

## 2019-02-15 NOTE — Patient Instructions (Signed)
Start Azithromycin antibiotics for infection. Take 2 tablets by mouth today, then 1 tablet daily for 4 additional days.  Start prednisone 20 mg tablets. Take 2 tablets daily for 5 days.  Start Tamiflu. Take 1 capsule once daily for 10 days for flu exposure.  Please notify us if you develop fevers, do not notice a decrease in shortness of breath, and/or feel worse.   It was a pleasure to see you today!

## 2019-02-24 ENCOUNTER — Other Ambulatory Visit (HOSPITAL_COMMUNITY): Payer: Self-pay | Admitting: Psychiatry

## 2019-02-24 ENCOUNTER — Institutional Professional Consult (permissible substitution): Payer: Self-pay | Admitting: Pulmonary Disease

## 2019-02-24 ENCOUNTER — Other Ambulatory Visit: Payer: Self-pay

## 2019-02-24 ENCOUNTER — Encounter: Payer: Self-pay | Admitting: Internal Medicine

## 2019-02-24 ENCOUNTER — Ambulatory Visit (INDEPENDENT_AMBULATORY_CARE_PROVIDER_SITE_OTHER): Payer: Medicare Other | Admitting: Internal Medicine

## 2019-02-24 VITALS — BP 128/72 | HR 82 | Ht 62.0 in | Wt 117.2 lb

## 2019-02-24 DIAGNOSIS — F17200 Nicotine dependence, unspecified, uncomplicated: Secondary | ICD-10-CM | POA: Diagnosis not present

## 2019-02-24 DIAGNOSIS — G912 (Idiopathic) normal pressure hydrocephalus: Secondary | ICD-10-CM

## 2019-02-24 DIAGNOSIS — J9611 Chronic respiratory failure with hypoxia: Secondary | ICD-10-CM | POA: Diagnosis not present

## 2019-02-24 MED ORDER — ALBUTEROL SULFATE (2.5 MG/3ML) 0.083% IN NEBU: 3.0000 mL | INHALATION_SOLUTION | Freq: Four times a day (QID) | RESPIRATORY_TRACT | 12 refills | Status: DC | PRN

## 2019-02-24 NOTE — Progress Notes (Signed)
MRN# [id number] Amanda Porter 1946/11/28 HPI- Amanda Porter - F former smoker followed for COPD, Chronic hypoxic respiratory failure, complicated by dementia/ TIAs/ Normal Pressure Hydrocephalus/ VP shunt 12/09/16,  LULobectomy for lung Ca 2000, Dysphagia solids/ liquids, Hx DVT/PE/ L femoral embolectomy 1998, DM2 **Desat walk 09/30/17; baseline sat was 95% and HR 83. After walking 360 feet sat was 86% and HR 120, added oxygen at 2L and sat increased to 92%   --------------------------------------------------------------------------------- 02/24/2019- 72 yoF former smoker followed for COPD, Chronic hypoxic respiratory failure, complicated by dementia/ TIAs/ Normal Pressure Hydrocephalus/ VP shunt 12/09/16,  LULobectomy for lung Ca 2000, Dysphagia solids/ liquids, Hx DVT/PE/ L femoral embolectomy 1998, DM2 O2 2L continuous and POC. Spiriva handihaler, neb albuterol, albuterol hfa,   -----referred by PCP Amanda Friendly NP; on 2 L O2 continuous; SOB on extertion, cough w/ yellow phlegm; on Spiriva & notes it helps; needs prescription for just albuterol neb solution She had seen Dr Ashby Dawes in [place] last year as she transitioned moving from [place] to now live here with daughter who is an EMT, because of dementia.  She had started [facility] but dropped because of her move.  Daughter reports labored breathing at night and asks about sleep study when Covid restrictions are relaxed. Frequently wakes SOB. She now seeks to establish here, indicating she feels stable. She minimizes symptoms but coughs some on many days of each week. Sputum has been intermittently yellow-brown, no blood, no fever. Compliant with O2.. The hose connection is coming loose on her POC.and needs service.  CXR 03/24/18- IMPRESSION: Postoperative change on the left with scarring and volume loss. No edema or consolidation. Stable cardiac silhouette. There is aortic atherosclerosis. Aortic Atherosclerosis  (ICD10-I70.0).  Prior to Admission medications   Medication Sig Start Date End Date Taking? Authorizing Provider  albuterol (PROVENTIL HFA;VENTOLIN HFA) 108 (90 Base) MCG/ACT inhaler INHALE 2 PUFFS BY MOUTH EVERY 4 HOURS AS NEEDED DX: J44.9 09/03/18  Yes Laverle Hobby, MD  albuterol (PROVENTIL) (2.5 MG/3ML) 0.083% nebulizer solution Take 3 mLs by nebulization every 6 (six) hours as needed. 02/24/19  Yes Layken Doenges, Tarri Fuller D, MD  alendronate (FOSAMAX) 70 MG tablet Take 1 tablet by mouth once weekly on an empty stomach with water only. Do not lay flat for one hour. 10/12/18  Yes Pleas Koch, NP  busPIRone (BUSPAR) 15 MG tablet Take 1 tablet (15 mg total) by mouth 2 (two) times daily. 12/14/18  Yes Pleas Koch, NP  clopidogrel (PLAVIX) 75 MG tablet TAKE 1 TABLET BY MOUTH EVERY DAY 08/24/18  Yes Pleas Koch, NP  hydrOXYzine (VISTARIL) 25 MG capsule 1  Bid  1  Or 2  q day prn  For anxiety 02/02/19  Yes Plovsky, Berneta Sages, MD  losartan (COZAAR) 100 MG tablet TAKE 1 TABLET BY MOUTH EVERY DAY 09/13/18  Yes Pleas Koch, NP  Melatonin 5 MG TABS Take 5 mg by mouth at bedtime.    Yes [provider]  Multiple Vitamin (MULTIVITAMIN) capsule Take 1 capsule by mouth daily.   Yes [provider]  ondansetron (ZOFRAN ODT) 4 MG disintegrating tablet Take 1 tablet (4 mg total) by mouth every 8 (eight) hours as needed for nausea or vomiting. 09/06/18  Yes Pleas Koch, NP  OXYGEN 2 L continuous.    Yes [provider]  rosuvastatin (CRESTOR) 5 MG tablet TAKE 1 TABLET BY MOUTH EVERY DAY IN THE EVENING 12/09/18  Yes Pleas Koch, NP  tiotropium (SPIRIVA HANDIHALER) 18 MCG  inhalation capsule Place 1 capsule (18 mcg total) into inhaler and inhale daily. 01/26/19  Yes Pleas Koch, NP  ONE TOUCH LANCETS MISC 1 each by Other route 2 (two) times daily. Patient not taking: Reported on 02/24/2019 11/27/17   Pleas Koch, NP  ONE [facility] ULTRA TEST test strip  USE 1 TWICE A DAY AS INSTRUCTED Patient not taking: Reported on 02/24/2019 02/08/18   Pleas Koch, NP   Past Surgical History:  Procedure Laterality Date  . LUNG REMOVAL, PARTIAL     left upper lobe  . VENTRICULOPERITONEAL SHUNT  12/09/2016   Past Medical History:  Diagnosis Date  . Anxiety   . Anxiety and depression   . COPD (chronic obstructive pulmonary disease) ([place])   . CVA (cerebral vascular accident) ([place])   . Dementia ([place])   . Depression   . Diabetes mellitus without complication ([place])   . Dysphasia   . History of lung cancer   . Hyperlipidemia   . Memory disturbance 11/20/2017  . Normal pressure hydrocephalus ([place]) 2018  . Retinal detachment    Right  . Right carotid bruit 11/20/2017  . Thrombosis    Arterial to lower extremity?   Family History  Problem Relation Age of Onset  . Pneumonia Mother   . Cancer Father   . Diabetes Sister   . Diabetes Sister   . Breast cancer Neg Hx    Social History   Socioeconomic History  . Marital status: Divorced    Spouse name: Not on file  . Number of children: 1  . Years of education: Not on file  . Highest education level: Not on file  Occupational History  . Occupation: Retired  Scientific laboratory technician  . Financial resource strain: Not on file  . Food insecurity:    Worry: Not on file    Inability: Not on file  . Transportation needs:    Medical: Not on file    Non-medical: Not on file  Tobacco Use  . Smoking status: Former Smoker    Packs/day: 0.50    Years: 50.00    Pack years: 25.00    Last attempt to quit: 08/01/2017    Years since quitting: 1.5  . Smokeless tobacco: Never Used  Substance and Sexual Activity  . Alcohol use: No  . Drug use: No  . Sexual activity: Not Currently  Lifestyle  . Physical activity:    Days per week: Not on file    Minutes per session: Not on file  . Stress: Not on file  Relationships  . Social connections:    Talks on phone: Not on file    Gets together: Not on file     Attends religious service: Not on file    Active member of club or organization: Not on file    Attends meetings of clubs or organizations: Not on file    Relationship status: Not on file  . Intimate partner violence:    Fear of current or ex partner: Not on file    Emotionally abused: Not on file    Physically abused: Not on file    Forced sexual activity: Not on file  Other Topics Concern  . Not on file  Social History Narrative   Moved from [place].   Lives with daughter.   Right handed   ROS-see HPI   + = positive Constitutional:    weight loss, night sweats, fevers, chills, fatigue, lassitude. HEENT:    headaches, difficulty swallowing, tooth/dental problems,  sore throat,       sneezing, itching, ear ache, nasal congestion, post nasal drip, snoring CV:    chest pain, orthopnea, PND, swelling in lower extremities, anasarca,                                  dizziness, palpitations Resp:   +shortness of breath with exertion or at rest.                +productive cough,   non-productive cough, coughing up of blood.              +change in color of mucus.  wheezing.   Skin:    rash or lesions. GI:  No-   heartburn, +indigestion, abdominal pain, nausea, vomiting, diarrhea,                 change in bowel habits, loss of appetite GU: dysuria, change in color of urine, no urgency or frequency.   flank pain. MS:  + joint pain, stiffness, decreased range of motion, back pain. Neuro-     + HPI Psych:  change in mood or affect.  +depression or +anxiety.   memory loss.  OBJ- Physical Exam General- Alert, Oriented, Affect-appropriate, Distress- none acute, O2 2L pulse Skin- rash-none, lesions- none, excoriation- none Lymphadenopathy- none Head- atraumatic            Eyes- Gross vision intact, PERRLA, conjunctivae and secretions clear            Ears- Hearing, canals-normal            Nose- Clear, no-Septal dev, mucus, polyps, erosion, perforation             Throat- Mallampati II , mucosa  clear , drainage- none, tonsils- atrophic Neck- flexible , trachea midline, no stridor , thyroid nl, carotid no bruit Chest - symmetrical excursion , unlabored           Heart/CV- RRR , no murmur , no gallop  , no rub, nl s1 s2                           - JVD- none , edema- none, stasis changes- none, varices- none           Lung- clear to P&A, wheeze- none, cough- none , dullness-none, rub- none           Chest wall-  Abd-  Br/ Gen/ Rectal- Not done, not indicated Extrem- cyanosis- none, clubbing, none, atrophy- none, strength- nl Neuro- grossly intact to observation

## 2019-02-24 NOTE — Patient Instructions (Signed)
Order- need to change DME office to [facility] for both O2 2L continuous and portable, and also nebulizer machine/ supplies. Previously has been with [facility] with [facility].  Order- DME [facility] please service or replace POC 2 L pulse- Hose connection is failing           Dx chronic respiratory failure with hypoxia  Order- - DME [facility] change neb solution to albuterol - script printed  Please call if we can help

## 2019-02-25 DIAGNOSIS — J9611 Chronic respiratory failure with hypoxia: Secondary | ICD-10-CM | POA: Insufficient documentation

## 2019-02-25 NOTE — Assessment & Plan Note (Signed)
She will continue O2 2L continuous and portable, through [facility]. Consider if needs to reassess coverage at night.

## 2019-02-25 NOTE — Assessment & Plan Note (Signed)
In long term remission after quitting 2o18

## 2019-02-25 NOTE — Assessment & Plan Note (Signed)
VP shunt, followed by Neurology

## 2019-02-25 NOTE — Assessment & Plan Note (Signed)
Reported FEV1 45%.  Now near baseline. Plan- update PFT when able. Rx albuterol neb solution through [facility] branch.

## 2019-03-06 ENCOUNTER — Other Ambulatory Visit: Payer: Self-pay | Admitting: Primary Care

## 2019-03-06 DIAGNOSIS — F32A Depression, unspecified: Secondary | ICD-10-CM

## 2019-03-06 DIAGNOSIS — F329 Major depressive disorder, single episode, unspecified: Secondary | ICD-10-CM

## 2019-03-06 DIAGNOSIS — F419 Anxiety disorder, unspecified: Principal | ICD-10-CM

## 2019-03-08 ENCOUNTER — Other Ambulatory Visit: Payer: Self-pay

## 2019-03-08 DIAGNOSIS — F329 Major depressive disorder, single episode, unspecified: Secondary | ICD-10-CM

## 2019-03-08 DIAGNOSIS — F419 Anxiety disorder, unspecified: Principal | ICD-10-CM

## 2019-03-08 DIAGNOSIS — E785 Hyperlipidemia, unspecified: Secondary | ICD-10-CM

## 2019-03-09 MED ORDER — ROSUVASTATIN CALCIUM 5 MG PO TABS: 5.0000 mg | ORAL_TABLET | Freq: Every evening | ORAL | 0 refills | Status: DC

## 2019-03-10 ENCOUNTER — Other Ambulatory Visit: Payer: Self-pay | Admitting: Primary Care

## 2019-03-10 DIAGNOSIS — I639 Cerebral infarction, unspecified: Secondary | ICD-10-CM

## 2019-03-14 ENCOUNTER — Ambulatory Visit: Payer: Self-pay | Admitting: Nurse Practitioner

## 2019-04-06 ENCOUNTER — Ambulatory Visit (INDEPENDENT_AMBULATORY_CARE_PROVIDER_SITE_OTHER): Payer: Medicare Other | Admitting: Psychiatry

## 2019-04-06 ENCOUNTER — Other Ambulatory Visit: Payer: Self-pay

## 2019-04-06 DIAGNOSIS — F4323 Adjustment disorder with mixed anxiety and depressed mood: Secondary | ICD-10-CM

## 2019-04-06 DIAGNOSIS — F329 Major depressive disorder, single episode, unspecified: Secondary | ICD-10-CM | POA: Diagnosis not present

## 2019-04-06 DIAGNOSIS — F419 Anxiety disorder, unspecified: Secondary | ICD-10-CM | POA: Diagnosis not present

## 2019-04-06 MED ORDER — BUSPIRONE HCL 15 MG PO TABS: ORAL_TABLET | ORAL | 4 refills | Status: DC

## 2019-04-06 MED ORDER — HYDROXYZINE PAMOATE 25 MG PO CAPS: ORAL_CAPSULE | ORAL | 2 refills | Status: DC

## 2019-04-06 NOTE — Progress Notes (Signed)
Psychiatric Initial Adult Assessment   Patient Identification: Amanda Porter MRN:  [id number] Date of Evaluation:  04/06/2019 Referral Source: [facility] primary care Chief Complaint:   Visit Diagnosis: Adjustment disorder with anxiety  History of Present Illness: Anxiety  Today the patient is doing fairly well.  She believes the Vistaril has been helpful.  She is sleeping better.  Overall her anxiety is better.  She still has episodes of anxiety and she takes of Vistaril and sometimes to open.  When she takes these extra to Vistaril she gets a little dizzy.  Note is also the patient is taking BuSpar 15 mg twice a day.  Patient denies being depressed.  She is eating well has good energy and enjoys life.  She watches a lot of TV.  Her daughter [name] tries to get her to walk all the time.  The patient is worried that she walks too slowly for her daughter.  The patient is oxygen dependent.  Overall the patient denies chest pain or any other physical problems.  She has no signs of the risk of infection.  No fevers or chills.  She does not drink any alcohol uses no drugs.  Overall she is positive and optimistic.  Her situational anxiety does seem to be a little bit better.  We have not needed to consider Neurontin at this time.  (Hypo) Manic Symptoms:   Anxiety Symptoms:  Excessive Worry, Psychotic Symptoms:   PTSD Symptoms:   Past Psychiatric History: Multiple antidepressants, SSRIs Remeron  Previous Psychotropic Medications:   Substance Abuse History in the last 12 months:    Consequences of Substance Abuse:   Past Medical History:  Past Medical History:  Diagnosis Date  . Anxiety   . Anxiety and depression   . COPD (chronic obstructive pulmonary disease) ([place])   . CVA (cerebral vascular accident) ([place])   . Dementia ([place])   . Depression   . Diabetes mellitus without complication ([place])   . Dysphasia   . History of lung cancer   . Hyperlipidemia   . Memory disturbance 11/20/2017   . Normal pressure hydrocephalus ([place]) 2018  . Retinal detachment    Right  . Right carotid bruit 11/20/2017  . Thrombosis    Arterial to lower extremity?    Past Surgical History:  Procedure Laterality Date  . LUNG REMOVAL, PARTIAL     left upper lobe  . VENTRICULOPERITONEAL SHUNT  12/09/2016    Family Psychiatric History:   Family History:  Family History  Problem Relation Age of Onset  . Pneumonia Mother   . Cancer Father   . Diabetes Sister   . Diabetes Sister   . Breast cancer Neg Hx     Social History:   Social History   Socioeconomic History  . Marital status: Divorced    Spouse name: Not on file  . Number of children: 1  . Years of education: Not on file  . Highest education level: Not on file  Occupational History  . Occupation: Retired  Scientific laboratory technician  . Financial resource strain: Not on file  . Food insecurity:    Worry: Not on file    Inability: Not on file  . Transportation needs:    Medical: Not on file    Non-medical: Not on file  Tobacco Use  . Smoking status: Former Smoker    Packs/day: 0.50    Years: 50.00    Pack years: 25.00    Last attempt to quit: 08/01/2017    Years since  quitting: 1.6  . Smokeless tobacco: Never Used  Substance and Sexual Activity  . Alcohol use: No  . Drug use: No  . Sexual activity: Not Currently  Lifestyle  . Physical activity:    Days per week: Not on file    Minutes per session: Not on file  . Stress: Not on file  Relationships  . Social connections:    Talks on phone: Not on file    Gets together: Not on file    Attends religious service: Not on file    Active member of club or organization: Not on file    Attends meetings of clubs or organizations: Not on file    Relationship status: Not on file  Other Topics Concern  . Not on file  Social History Narrative   Moved from [place].   Lives with daughter.   Right handed    Additional Social History:   Allergies:   Allergies  Allergen Reactions  .  Lexapro [Escitalopram Oxalate] Other (See Comments)    Dizziness, nausea, fatigue  . Neosporin  [Neomycin-Polymyxin-Gramicidin] Rash    (Neosporin)    Metabolic Disorder Labs: Lab Results  Component Value Date   HGBA1C 6.9 (H) 12/14/2018   No results found for: PROLACTIN Lab Results  Component Value Date   CHOL 157 09/15/2017   TRIG 105.0 09/15/2017   HDL 58.80 09/15/2017   CHOLHDL 3 09/15/2017   VLDL 21.0 09/15/2017   LDLCALC 77 09/15/2017   No results found for: TSH  Therapeutic Level Labs: No results found for: LITHIUM No results found for: CBMZ No results found for: VALPROATE  Current Medications: Current Outpatient Medications  Medication Sig Dispense Refill  . albuterol (PROVENTIL HFA;VENTOLIN HFA) 108 (90 Base) MCG/ACT inhaler INHALE 2 PUFFS BY MOUTH EVERY 4 HOURS AS NEEDED DX: J44.9 8.5 Inhaler 2  . albuterol (PROVENTIL) (2.5 MG/3ML) 0.083% nebulizer solution Take 3 mLs by nebulization every 6 (six) hours as needed. 150 mL 12  . alendronate (FOSAMAX) 70 MG tablet Take 1 tablet by mouth once weekly on an empty stomach with water only. Do not lay flat for one hour. 12 tablet 3  . busPIRone (BUSPAR) 15 MG tablet 1  qam   2   qpm 90 tablet 4  . clopidogrel (PLAVIX) 75 MG tablet TAKE 1 TABLET BY MOUTH EVERY DAY 90 tablet 1  . hydrOXYzine (VISTARIL) 25 MG capsule TAKE 1 CAPSULE BY MOUTH TWICE A DAY OR 1-2 CAPSULES ONCE A DAY AS NEEDED 360 capsule 2  . losartan (COZAAR) 100 MG tablet TAKE 1 TABLET BY MOUTH EVERY DAY 90 tablet 1  . Melatonin 5 MG TABS Take 5 mg by mouth at bedtime.     . Multiple Vitamin (MULTIVITAMIN) capsule Take 1 capsule by mouth daily.    . ondansetron (ZOFRAN ODT) 4 MG disintegrating tablet Take 1 tablet (4 mg total) by mouth every 8 (eight) hours as needed for nausea or vomiting. 20 tablet 0  . ONE TOUCH LANCETS MISC 1 each by Other route 2 (two) times daily. (Patient not taking: Reported on 02/24/2019) 200 each 1  . ONE TOUCH ULTRA TEST test strip  USE 1 TWICE A DAY AS INSTRUCTED (Patient not taking: Reported on 02/24/2019) 100 each 5  . OXYGEN 2 L continuous.     . rosuvastatin (CRESTOR) 5 MG tablet Take 1 tablet (5 mg total) by mouth every evening. 90 tablet 0  . tiotropium (SPIRIVA HANDIHALER) 18 MCG inhalation capsule Place 1 capsule (18 mcg total)  into inhaler and inhale daily. 30 capsule 0   No current facility-administered medications for this visit.     Musculoskeletal: Strength & Muscle Tone: decreased Gait & Station: normal Patient leans: N/A  Psychiatric Specialty Exam: ROS  There were no vitals taken for this visit.There is no height or weight on file to calculate BMI.  General Appearance: Casual  Eye Contact:  Good  Speech:  Clear and Coherent  Volume:  Normal  Mood:  Anxious  Affect:  Appropriate  Thought Process:  Coherent  Orientation:  NA  Thought Content:  Logical  Suicidal Thoughts:  No  Homicidal Thoughts:  No  Memory:  Negative  Judgement:  Good  Insight:  Fair  Psychomotor Activity:  Normal  Concentration:    Recall:  [place] of Knowledge:Fair  Language: Fair  Akathisia:  No  Handed:  Right  AIMS (if indicated):  not done  Assets:  Desire for Improvement  ADL's:  Intact  Cognition: Impaired,  Mild  Sleep:  Good   Screenings: GAD-7     Office Visit from 02/18/2018 in [place] at [facility] Visit from 11/26/2017 in [place] at [facility]  Total GAD-7 Score  15  15    Mini-Mental     Office Visit from 07/30/2018 in [facility] Office Visit from 11/20/2017 in [facility]  Total Score (max 30 points )  25  27    PHQ2-9     Nutrition from 03/26/2018 in [facility] from 03/02/2018 in [facility]  PHQ-2 Total Score  6  6  PHQ-9 Total Score  19  18      Assessment and Plan: \  At this time the patient will continue taking Vistaril 25 mg twice daily.  I have asked  him to take 1 extra if she gets anxious but not to open.  Today we will go ahead and increase her BuSpar to a dose of 15 mg 1 in the morning and 2 at night.  We again recommended that they make contact with the therapist Caroline Sauger with the patient is seen in the past.  Patient is willing to do that.  Her daughter is very supportive of her.  Overall patient is functioning well.  Her anxiety is distinctly reduced.  As the virus becomes less and less evident to our community I suspect this patient will start to feel better.  She will return to see me in 2 months for her second visit. Jerral Ralph, MD 5/6/20204:33 PM

## 2019-05-03 ENCOUNTER — Other Ambulatory Visit (HOSPITAL_COMMUNITY): Payer: Self-pay | Admitting: Psychiatry

## 2019-05-03 DIAGNOSIS — F329 Major depressive disorder, single episode, unspecified: Secondary | ICD-10-CM

## 2019-05-03 DIAGNOSIS — F32A Depression, unspecified: Secondary | ICD-10-CM

## 2019-05-04 ENCOUNTER — Telehealth: Payer: Self-pay | Admitting: Primary Care

## 2019-05-04 NOTE — Telephone Encounter (Signed)
Best number [phone] Pt returning your call pt stating she is doing better

## 2019-05-04 NOTE — Telephone Encounter (Signed)
I didn't speak with her but I'm glad to know she's better.

## 2019-05-18 ENCOUNTER — Telehealth: Payer: Self-pay

## 2019-05-18 NOTE — Telephone Encounter (Signed)
I reached out to the pt's daughter [name] ok per dpr)  and discussed appt.   Due to current COVID 19 pandemic, our office is severely reducing in office visits until further notice, in order to minimize the risk to our patients and healthcare providers.   Daughter was agreeable to keeping this appt for a face to face on 05/23/19 at 4 pm.  Check in process and covid 19 precautions reviewed.   Advised to check in at 330 pm.

## 2019-05-19 NOTE — Telephone Encounter (Signed)
I called pt's daughter, [name], per [facility]. She needs to r/s Monday's appt. She is asking for first appt in the morning. Pt's daughter is agreeable to an appt on 05/25/2019 at 7:30am. Pt's daughter verbalized understanding of new appt date and time and COVID policies.

## 2019-05-20 ENCOUNTER — Other Ambulatory Visit: Payer: Self-pay

## 2019-05-20 DIAGNOSIS — J449 Chronic obstructive pulmonary disease, unspecified: Secondary | ICD-10-CM

## 2019-05-20 MED ORDER — SPIRIVA HANDIHALER 18 MCG IN CAPS: 18.0000 ug | ORAL_CAPSULE | Freq: Every day | RESPIRATORY_TRACT | 0 refills | Status: DC

## 2019-05-23 ENCOUNTER — Ambulatory Visit: Payer: Self-pay | Admitting: Neurology

## 2019-05-24 ENCOUNTER — Encounter: Payer: Self-pay | Admitting: Neurology

## 2019-05-24 ENCOUNTER — Other Ambulatory Visit: Payer: Self-pay

## 2019-05-24 ENCOUNTER — Ambulatory Visit (INDEPENDENT_AMBULATORY_CARE_PROVIDER_SITE_OTHER): Payer: Medicare Other | Admitting: Neurology

## 2019-05-24 VITALS — BP 149/73 | HR 80 | Temp 97.7°F | Ht 62.0 in | Wt 118.0 lb

## 2019-05-24 DIAGNOSIS — R350 Frequency of micturition: Secondary | ICD-10-CM | POA: Diagnosis not present

## 2019-05-24 DIAGNOSIS — G912 (Idiopathic) normal pressure hydrocephalus: Secondary | ICD-10-CM

## 2019-05-24 DIAGNOSIS — M4302 Spondylolysis, cervical region: Secondary | ICD-10-CM | POA: Diagnosis not present

## 2019-05-24 DIAGNOSIS — F0390 Unspecified dementia without behavioral disturbance: Secondary | ICD-10-CM

## 2019-05-24 MED ORDER — GABAPENTIN 100 MG PO CAPS: 100.0000 mg | ORAL_CAPSULE | Freq: Three times a day (TID) | ORAL | 2 refills | Status: DC

## 2019-05-24 NOTE — Patient Instructions (Signed)
We will start gabapentin for the neck pain and dizziness.  Neurontin (gabapentin) may result in drowsiness, ankle swelling, gait instability, or possibly dizziness. Please contact our office if significant side effects occur with this medication.

## 2019-05-24 NOTE — Progress Notes (Signed)
Reason for visit: Normal pressure hydrocephalus, dementia  Amanda Porter is an 73 y.o. female  History of present illness:  Amanda Porter is a 73 year old right-handed black female with a history of normal pressure hydrocephalus.  The patient has had a VP shunt placement.  When she was seen last in August 2019, she was complaining of headaches but the daughter indicates that she really meant that she was having dizziness.  She did go for a neurosurgical evaluation and had a shunt revision which seemed to help the dizziness some but the dizziness has continued to some degree.  She is generally worse in the morning.  She reports dizziness when she goes from lying to sitting or from sitting to standing, with brief transient dizziness.  Once the dizziness passes, she feels better.  The patient is not having blackouts and she has not had any falls.  The patient does have some gait instability.  She has recently had some increased frequency of urination over the last several days.  She is on an oxygen concentrator.  She continues to have memory issues.  She has reported some neck discomfort and some discomfort into the back of the head, it is not clear that this correlates with her dizziness.  She has some crepitus when she turns her head.  She comes back to the office today for an evaluation.  Past Medical History:  Diagnosis Date  . Anxiety   . Anxiety and depression   . COPD (chronic obstructive pulmonary disease) ([place])   . CVA (cerebral vascular accident) ([place])   . Dementia ([place])   . Depression   . Diabetes mellitus without complication ([place])   . Dysphasia   . History of lung cancer   . Hyperlipidemia   . Memory disturbance 11/20/2017  . Normal pressure hydrocephalus ([place]) 2018  . Retinal detachment    Right  . Right carotid bruit 11/20/2017  . Thrombosis    Arterial to lower extremity?    Past Surgical History:  Procedure Laterality Date  . LUNG REMOVAL, PARTIAL     left  upper lobe  . VENTRICULOPERITONEAL SHUNT  12/09/2016    Family History  Problem Relation Age of Onset  . Pneumonia Mother   . Cancer Father   . Diabetes Sister   . Diabetes Sister   . Breast cancer Neg Hx     Social history:  reports that she quit smoking about 21 months ago. She has a 25.00 pack-year smoking history. She has never used smokeless tobacco. She reports that she does not drink alcohol or use drugs.    Allergies  Allergen Reactions  . Lexapro [Escitalopram Oxalate] Other (See Comments)    Dizziness, nausea, fatigue  . Neosporin  [Neomycin-Polymyxin-Gramicidin] Rash    (Neosporin)    Medications:  Prior to Admission medications   Medication Sig Start Date End Date Taking? Authorizing Provider  albuterol (PROVENTIL HFA;VENTOLIN HFA) 108 (90 Base) MCG/ACT inhaler INHALE 2 PUFFS BY MOUTH EVERY 4 HOURS AS NEEDED DX: J44.9 09/03/18  Yes Laverle Hobby, MD  albuterol (PROVENTIL) (2.5 MG/3ML) 0.083% nebulizer solution Take 3 mLs by nebulization every 6 (six) hours as needed. 02/24/19  Yes Young, Tarri Fuller D, MD  alendronate (FOSAMAX) 70 MG tablet Take 1 tablet by mouth once weekly on an empty stomach with water only. Do not lay flat for one hour. 10/12/18  Yes Pleas Koch, NP  busPIRone (BUSPAR) 15 MG tablet 1  qam   2   qpm 04/06/19  Yes Plovsky, Berneta Sages, MD  clopidogrel (PLAVIX) 75 MG tablet TAKE 1 TABLET BY MOUTH EVERY DAY 03/10/19  Yes Pleas Koch, NP  Ferrous Sulfate (IRON PO) Take by mouth.   Yes [provider]  hydrOXYzine (VISTARIL) 25 MG capsule TAKE 1 CAPSULE BY MOUTH TWICE A DAY OR 1-2 CAPSULES ONCE A DAY AS NEEDED 04/06/19  Yes Plovsky, Berneta Sages, MD  losartan (COZAAR) 100 MG tablet TAKE 1 TABLET BY MOUTH EVERY DAY 09/13/18  Yes Pleas Koch, NP  Melatonin 5 MG TABS Take 5 mg by mouth at bedtime.    Yes [provider]  Multiple Vitamin (MULTIVITAMIN) capsule Take 1 capsule by mouth daily.   Yes [provider]   ondansetron (ZOFRAN ODT) 4 MG disintegrating tablet Take 1 tablet (4 mg total) by mouth every 8 (eight) hours as needed for nausea or vomiting. 09/06/18  Yes Pleas Koch, NP  OXYGEN 2 L continuous.    Yes [provider]  rosuvastatin (CRESTOR) 5 MG tablet Take 1 tablet (5 mg total) by mouth every evening. 03/09/19  Yes Pleas Koch, NP  tiotropium (SPIRIVA HANDIHALER) 18 MCG inhalation capsule Place 1 capsule (18 mcg total) into inhaler and inhale daily. 05/20/19  Yes Pleas Koch, NP    ROS:  Out of a complete 14 system review of symptoms, the patient complains only of the following symptoms, and all other reviewed systems are negative.  Neck pain, crepitus Walking problems Memory disturbance Dizziness  Blood pressure (!) 149/73, pulse 80, temperature 97.7 F (36.5 C), temperature source Oral, height 5\' 2"  (1.575 m), weight 118 lb (53.5 kg).   Blood pressure, right arm, sitting is 158/70.  Blood pressure, right arm, standing is 148/70.  Physical Exam  General: The patient is alert and cooperative at the time of the examination.   Neuromuscular: Range move the cervical spine lacks about 15 degrees of full lateral rotation bilaterally.  Skin: No significant peripheral edema is noted.   Neurologic Exam  Mental status: The patient is alert and oriented x 3 at the time of the examination. The Mini-Mental status examination done today shows a total score 23/30.   Cranial nerves: Facial symmetry is present. Speech is normal, no aphasia or dysarthria is noted. Extraocular movements are full. Visual fields are full.  Motor: The patient has good strength in all 4 extremities.  Sensory examination: Soft touch sensation is symmetric on the face, arms, and legs.  Coordination: The patient has good finger-nose-finger and heel-to-shin bilaterally.  Gait and station: The patient has a slightly wide-based gait, the patient can walk independently.  Tandem gait is  unsteady.  Romberg is negative.  Reflexes: Deep tendon reflexes are symmetric.   Assessment/Plan:  1.  Normal pressure hydrocephalus, status post VP shunt  2.  Gait instability  3.  Postural dizziness  4.  Cervical spondylosis, cervicogenic headache  The patient appears to have dizziness with changes in position, she does not appear to be significantly orthostatic today.  She claims that she is getting in plenty of fluids.  It is possible that the neck issues may be also causing some dizziness.  We will treat with gabapentin taking 100 mg 3 times daily, if she is not getting benefit from this after 1 month she is to stop the medication.  They will call for any dose adjustments.  She otherwise will follow-up in 6 months.  We will do urinalysis today.  Jill Alexanders MD 05/24/2019 7:53 AM  [facility] [address] [place], [place] [address]  Phone [phone] Fax [phone]

## 2019-05-25 LAB — URINALYSIS, ROUTINE W REFLEX MICROSCOPIC
Bilirubin, UA: NEGATIVE
Glucose, UA: NEGATIVE
Ketones, UA: NEGATIVE
Leukocytes,UA: NEGATIVE
Nitrite, UA: NEGATIVE
Protein,UA: NEGATIVE
RBC, UA: NEGATIVE
Specific Gravity, UA: 1.015 (ref 1.005–1.030)
Urobilinogen, Ur: 0.2 mg/dL (ref 0.2–1.0)
pH, UA: 6 (ref 5.0–7.5)

## 2019-05-30 ENCOUNTER — Telehealth: Payer: Self-pay | Admitting: Oncology

## 2019-05-30 NOTE — Telephone Encounter (Signed)
FS PAL moved 7/7 appointments to 8/11. Confirmed with dtr.

## 2019-05-31 ENCOUNTER — Other Ambulatory Visit: Payer: Self-pay | Admitting: Primary Care

## 2019-05-31 DIAGNOSIS — E785 Hyperlipidemia, unspecified: Secondary | ICD-10-CM

## 2019-06-01 ENCOUNTER — Ambulatory Visit (INDEPENDENT_AMBULATORY_CARE_PROVIDER_SITE_OTHER): Payer: Medicare Other | Admitting: Psychiatry

## 2019-06-01 ENCOUNTER — Other Ambulatory Visit: Payer: Self-pay | Admitting: Primary Care

## 2019-06-01 ENCOUNTER — Other Ambulatory Visit: Payer: Self-pay

## 2019-06-01 DIAGNOSIS — F329 Major depressive disorder, single episode, unspecified: Secondary | ICD-10-CM | POA: Diagnosis not present

## 2019-06-01 DIAGNOSIS — F4323 Adjustment disorder with mixed anxiety and depressed mood: Secondary | ICD-10-CM

## 2019-06-01 DIAGNOSIS — F419 Anxiety disorder, unspecified: Secondary | ICD-10-CM | POA: Diagnosis not present

## 2019-06-01 DIAGNOSIS — F32A Depression, unspecified: Secondary | ICD-10-CM

## 2019-06-01 DIAGNOSIS — I10 Essential (primary) hypertension: Secondary | ICD-10-CM

## 2019-06-01 MED ORDER — HYDROXYZINE PAMOATE 25 MG PO CAPS: ORAL_CAPSULE | ORAL | 2 refills | Status: DC

## 2019-06-01 MED ORDER — BUSPIRONE HCL 15 MG PO TABS: ORAL_TABLET | ORAL | 5 refills | Status: DC

## 2019-06-01 NOTE — Telephone Encounter (Signed)
Patient has appointment on 06/02/2019 and in the notes, it stated that BP med change

## 2019-06-01 NOTE — Progress Notes (Signed)
Psychiatric Initial Adult Assessment   Patient Identification: Amanda Porter MRN:  [id number] Date of Evaluation:  06/01/2019 Referral Source: [facility] primary care Chief Complaint:   Visit Diagnosis: Adjustment disorder with anxiety  History of Present Illness: Anxiety  Today the patient was interviewed with her daughter [name].  Initially the patient said that she was actually doing quite well.  In fact she was doing better.  Actually came on the phone since she has bouts of being very anxious now.  Patient also had a fall did not hit her head but lost her balance.  This happened weeks ago.  Overall though the patient does not seem to have much of a problem.  She really denies daily depression or persistent anxiety.  I think she has ups and downs.  Patient really is walking fairly well.  She is eating very well.  She is got good energy.  She has no evidence of psychosis.  The increase of BuSpar at both of them say is been helpful.  The patient had difficulty getting to the therapist Bambi Cottle.  Today we gave her their number to call directly to Mrs. Cottle.  Nonetheless the patient seems to be functioning quite well has good energy and is no signs of a respiratory infection.  She denies focal neurological complaints.  (Hypo) Manic Symptoms:   Anxiety Symptoms:  Excessive Worry, Psychotic Symptoms:   PTSD Symptoms:   Past Psychiatric History: Multiple antidepressants, SSRIs Remeron  Previous Psychotropic Medications:   Substance Abuse History in the last 12 months:    Consequences of Substance Abuse:   Past Medical History:  Past Medical History:  Diagnosis Date  . Anxiety   . Anxiety and depression   . COPD (chronic obstructive pulmonary disease) ([place])   . CVA (cerebral vascular accident) ([place])   . Dementia ([place])   . Depression   . Diabetes mellitus without complication ([place])   . Dysphasia   . History of lung cancer   . Hyperlipidemia   . Memory disturbance 11/20/2017   . Normal pressure hydrocephalus ([place]) 2018  . Retinal detachment    Right  . Right carotid bruit 11/20/2017  . Thrombosis    Arterial to lower extremity?    Past Surgical History:  Procedure Laterality Date  . LUNG REMOVAL, PARTIAL     left upper lobe  . VENTRICULOPERITONEAL SHUNT  12/09/2016    Family Psychiatric History:   Family History:  Family History  Problem Relation Age of Onset  . Pneumonia Mother   . Cancer Father   . Diabetes Sister   . Diabetes Sister   . Breast cancer Neg Hx     Social History:   Social History   Socioeconomic History  . Marital status: Divorced    Spouse name: Not on file  . Number of children: 1  . Years of education: Not on file  . Highest education level: Not on file  Occupational History  . Occupation: Retired  Scientific laboratory technician  . Financial resource strain: Not on file  . Food insecurity    Worry: Not on file    Inability: Not on file  . Transportation needs    Medical: Not on file    Non-medical: Not on file  Tobacco Use  . Smoking status: Former Smoker    Packs/day: 0.50    Years: 50.00    Pack years: 25.00    Quit date: 08/01/2017    Years since quitting: 1.8  . Smokeless tobacco: Never Used  Substance and Sexual Activity  . Alcohol use: No  . Drug use: No  . Sexual activity: Not Currently  Lifestyle  . Physical activity    Days per week: Not on file    Minutes per session: Not on file  . Stress: Not on file  Relationships  . Social Herbalist on phone: Not on file    Gets together: Not on file    Attends religious service: Not on file    Active member of club or organization: Not on file    Attends meetings of clubs or organizations: Not on file    Relationship status: Not on file  Other Topics Concern  . Not on file  Social History Narrative   Moved from [place].   Lives with daughter.   Right handed    Additional Social History:   Allergies:   Allergies  Allergen Reactions  . Lexapro  [Escitalopram Oxalate] Other (See Comments)    Dizziness, nausea, fatigue  . Neosporin  [Neomycin-Polymyxin-Gramicidin] Rash    (Neosporin)    Metabolic Disorder Labs: Lab Results  Component Value Date   HGBA1C 6.9 (H) 12/14/2018   No results found for: PROLACTIN Lab Results  Component Value Date   CHOL 157 09/15/2017   TRIG 105.0 09/15/2017   HDL 58.80 09/15/2017   CHOLHDL 3 09/15/2017   VLDL 21.0 09/15/2017   LDLCALC 77 09/15/2017   No results found for: TSH  Therapeutic Level Labs: No results found for: LITHIUM No results found for: CBMZ No results found for: VALPROATE  Current Medications: Current Outpatient Medications  Medication Sig Dispense Refill  . albuterol (PROVENTIL HFA;VENTOLIN HFA) 108 (90 Base) MCG/ACT inhaler INHALE 2 PUFFS BY MOUTH EVERY 4 HOURS AS NEEDED DX: J44.9 8.5 Inhaler 2  . albuterol (PROVENTIL) (2.5 MG/3ML) 0.083% nebulizer solution Take 3 mLs by nebulization every 6 (six) hours as needed. 150 mL 12  . alendronate (FOSAMAX) 70 MG tablet Take 1 tablet by mouth once weekly on an empty stomach with water only. Do not lay flat for one hour. 12 tablet 3  . busPIRone (BUSPAR) 15 MG tablet 1  qam   2   qpm 90 tablet 5  . clopidogrel (PLAVIX) 75 MG tablet TAKE 1 TABLET BY MOUTH EVERY DAY 90 tablet 1  . Ferrous Sulfate (IRON PO) Take by mouth.    . gabapentin (NEURONTIN) 100 MG capsule Take 1 capsule (100 mg total) by mouth 3 (three) times daily. 90 capsule 2  . hydrOXYzine (VISTARIL) 25 MG capsule TAKE 1 CAPSULE BY MOUTH TWICE A DAY OR 1-2 CAPSULES ONCE A DAY AS NEEDED 360 capsule 2  . losartan (COZAAR) 100 MG tablet TAKE 1 TABLET BY MOUTH EVERY DAY 90 tablet 1  . Melatonin 5 MG TABS Take 5 mg by mouth at bedtime.     . Multiple Vitamin (MULTIVITAMIN) capsule Take 1 capsule by mouth daily.    . ondansetron (ZOFRAN ODT) 4 MG disintegrating tablet Take 1 tablet (4 mg total) by mouth every 8 (eight) hours as needed for nausea or vomiting. 20 tablet 0  .  OXYGEN 2 L continuous.     . rosuvastatin (CRESTOR) 5 MG tablet TAKE 1 TABLET BY MOUTH EVERY DAY IN THE EVENING 90 tablet 0  . tiotropium (SPIRIVA HANDIHALER) 18 MCG inhalation capsule Place 1 capsule (18 mcg total) into inhaler and inhale daily. 30 capsule 0   No current facility-administered medications for this visit.     Musculoskeletal: Strength &  Muscle Tone: decreased Gait & Station: normal Patient leans: N/A  Psychiatric Specialty Exam: ROS  There were no vitals taken for this visit.There is no height or weight on file to calculate BMI.  General Appearance: Casual  Eye Contact:  Good  Speech:  Clear and Coherent  Volume:  Normal  Mood:  Anxious  Affect:  Appropriate  Thought Process:  Coherent  Orientation:  NA  Thought Content:  Logical  Suicidal Thoughts:  No  Homicidal Thoughts:  No  Memory:  Negative  Judgement:  Good  Insight:  Fair  Psychomotor Activity:  Normal  Concentration:    Recall:  [place] of Knowledge:Fair  Language: Fair  Akathisia:  No  Handed:  Right  AIMS (if indicated):  not done  Assets:  Desire for Improvement  ADL's:  Intact  Cognition: Impaired,  Mild  Sleep:  Good   Screenings: GAD-7     Office Visit from 02/18/2018 in [place] at [facility] Visit from 11/26/2017 in [place] at [facility]  Total GAD-7 Score  15  15    Mini-Mental     Office Visit from 05/24/2019 in [facility] Office Visit from 07/30/2018 in [facility] Office Visit from 11/20/2017 in [facility]  Total Score (max 30 points )  23  25  27     PHQ2-9     Nutrition from 03/26/2018 in [facility] from 03/02/2018 in [facility]  PHQ-2 Total Score  6  6  PHQ-9 Total Score  19  18      Assessment and Plan: \ 7/1/20203:53 PM   Today the patient is doing fairly well.  Her major problem is adjustment disorder with an anxious  mood state.  At this time she successfully takes BuSpar 15 mg 1 in the morning and 2 at night and Vistaril 25 mg twice daily.  I think the combination of these 2 medicines are very effective in this patient.  The patient will return to see me in 4 months.  She is not suicidal and has no evidence of being depressed at this time.

## 2019-06-02 ENCOUNTER — Ambulatory Visit: Payer: Medicare Other | Admitting: Primary Care

## 2019-06-02 ENCOUNTER — Other Ambulatory Visit: Payer: Self-pay

## 2019-06-02 VITALS — BP 146/70 | HR 104 | Temp 97.7°F | Ht 62.0 in | Wt 118.5 lb

## 2019-06-02 DIAGNOSIS — R42 Dizziness and giddiness: Secondary | ICD-10-CM

## 2019-06-02 DIAGNOSIS — I10 Essential (primary) hypertension: Secondary | ICD-10-CM | POA: Diagnosis not present

## 2019-06-02 MED ORDER — LOSARTAN POTASSIUM 50 MG PO TABS: 50.0000 mg | ORAL_TABLET | Freq: Every day | ORAL | 0 refills | Status: DC

## 2019-06-02 NOTE — Assessment & Plan Note (Signed)
Continued, now with a recent fall. I was able to see the fall via video that was captured by her daughter. She did seem to raise up very quickly from squatting position when picking up the pizza box.   Will start with reduction in losartan to 50 mg as home readings are lower. I recommended cardiology evaluation given postural dizziness, she declines at this time. It would be reasonable to at least put her on a Holter Monitor to capture any potential arrhythmia, but she declines as well.   She will continue to monitor BP at home now with reducing in losartan to 50 mg. She and her daughter will update in 2 weeks. If dizziness persists then we will place a referral to cardiology at that point.

## 2019-06-02 NOTE — Progress Notes (Signed)
Subjective:    Patient ID: Amanda Porter, female    DOB: May 07, 1946, 73 y.o.   MRN: [id number]  HPI  Amanda Porter is a 73 year old female with a history of CVA, COPD, Type 2 Diabetes, Dementia, normal pressure hydrocephalus, anxiety and depression, chronic dizziness, hypertension who presents today with a chief complaint of fall.   She was bending over to pick up a pizza box from the ground, raised up, walked back inside the house and fell. She onto her left side into the front door which made an indention into the door. She denies hitting her head. She did have some left shoulder pain but this has improved. Denies decrease in ROM to left shoulder.   She continues to experience chronic dizziness and is following with neurology. The neurologist ruled out orthostatic changes during their visit in the office but thinks her dizziness may be from her blood pressure regimen. She's been evaluated by ENT who ruled out vertigo.   Her dizziness mostly occurs during movement, is worse with postural changes (leaning forward to standing, lying to standing). Improved with rest and watching TV. She's not seen cardiology.   She's checking her BP at home which is running 115-130's/50's-60's. Other office visits with other providers BP is running 130's/60's. She denies chest pain. She was once on Amlodipine but has not taken this in a while.   BP Readings from Last 3 Encounters:  06/02/19 (!) 146/70  05/24/19 (!) 149/73  02/24/19 128/72     Review of Systems  Eyes: Negative for visual disturbance.  Musculoskeletal:       Denies left shoulder pain  Skin: Negative for color change.  Neurological: Positive for dizziness. Negative for headaches.       Past Medical History:  Diagnosis Date  . Anxiety   . Anxiety and depression   . COPD (chronic obstructive pulmonary disease) ([place])   . CVA (cerebral vascular accident) ([place])   . Dementia ([place])   . Depression   . Diabetes mellitus without  complication ([place])   . Dysphasia   . History of lung cancer   . Hyperlipidemia   . Memory disturbance 11/20/2017  . Normal pressure hydrocephalus ([place]) 2018  . Retinal detachment    Right  . Right carotid bruit 11/20/2017  . Thrombosis    Arterial to lower extremity?     Social History   Socioeconomic History  . Marital status: Divorced    Spouse name: Not on file  . Number of children: 1  . Years of education: Not on file  . Highest education level: Not on file  Occupational History  . Occupation: Retired  Scientific laboratory technician  . Financial resource strain: Not on file  . Food insecurity    Worry: Not on file    Inability: Not on file  . Transportation needs    Medical: Not on file    Non-medical: Not on file  Tobacco Use  . Smoking status: Former Smoker    Packs/day: 0.50    Years: 50.00    Pack years: 25.00    Quit date: 08/01/2017    Years since quitting: 1.8  . Smokeless tobacco: Never Used  Substance and Sexual Activity  . Alcohol use: No  . Drug use: No  . Sexual activity: Not Currently  Lifestyle  . Physical activity    Days per week: Not on file    Minutes per session: Not on file  . Stress: Not on file  Relationships  .  Social Herbalist on phone: Not on file    Gets together: Not on file    Attends religious service: Not on file    Active member of club or organization: Not on file    Attends meetings of clubs or organizations: Not on file    Relationship status: Not on file  . Intimate partner violence    Fear of current or ex partner: Not on file    Emotionally abused: Not on file    Physically abused: Not on file    Forced sexual activity: Not on file  Other Topics Concern  . Not on file  Social History Narrative   Moved from [place].   Lives with daughter.   Right handed    Past Surgical History:  Procedure Laterality Date  . LUNG REMOVAL, PARTIAL     left upper lobe  . VENTRICULOPERITONEAL SHUNT  12/09/2016    Family History   Problem Relation Age of Onset  . Pneumonia Mother   . Cancer Father   . Diabetes Sister   . Diabetes Sister   . Breast cancer Neg Hx     Allergies  Allergen Reactions  . Lexapro [Escitalopram Oxalate] Other (See Comments)    Dizziness, nausea, fatigue  . Neosporin  [Neomycin-Polymyxin-Gramicidin] Rash    (Neosporin)    Current Outpatient Medications on File Prior to Visit  Medication Sig Dispense Refill  . albuterol (PROVENTIL HFA;VENTOLIN HFA) 108 (90 Base) MCG/ACT inhaler INHALE 2 PUFFS BY MOUTH EVERY 4 HOURS AS NEEDED DX: J44.9 8.5 Inhaler 2  . albuterol (PROVENTIL) (2.5 MG/3ML) 0.083% nebulizer solution Take 3 mLs by nebulization every 6 (six) hours as needed. 150 mL 12  . alendronate (FOSAMAX) 70 MG tablet Take 1 tablet by mouth once weekly on an empty stomach with water only. Do not lay flat for one hour. 12 tablet 3  . busPIRone (BUSPAR) 15 MG tablet 1  qam   2   qpm 90 tablet 5  . clopidogrel (PLAVIX) 75 MG tablet TAKE 1 TABLET BY MOUTH EVERY DAY 90 tablet 1  . Ferrous Sulfate (IRON PO) Take by mouth.    . gabapentin (NEURONTIN) 100 MG capsule Take 1 capsule (100 mg total) by mouth 3 (three) times daily. 90 capsule 2  . hydrOXYzine (VISTARIL) 25 MG capsule TAKE 1 CAPSULE BY MOUTH TWICE A DAY OR 1-2 CAPSULES ONCE A DAY AS NEEDED 360 capsule 2  . Melatonin 5 MG TABS Take 5 mg by mouth at bedtime.     . Multiple Vitamin (MULTIVITAMIN) capsule Take 1 capsule by mouth daily.    . ondansetron (ZOFRAN ODT) 4 MG disintegrating tablet Take 1 tablet (4 mg total) by mouth every 8 (eight) hours as needed for nausea or vomiting. 20 tablet 0  . OXYGEN 2 L continuous.     . rosuvastatin (CRESTOR) 5 MG tablet TAKE 1 TABLET BY MOUTH EVERY DAY IN THE EVENING 90 tablet 0  . tiotropium (SPIRIVA HANDIHALER) 18 MCG inhalation capsule Place 1 capsule (18 mcg total) into inhaler and inhale daily. 30 capsule 0   No current facility-administered medications on file prior to visit.     BP (!)  158/70   Pulse (!) 104   Temp 97.7 F (36.5 C) (Tympanic)   Ht 5\' 2"  (1.575 m)   Wt 118 lb 8 oz (53.8 kg)   SpO2 94%   BMI 21.67 kg/m    Objective:   Physical Exam  Constitutional: She appears well-nourished.  Neck: Neck supple.  Cardiovascular: Normal rate and regular rhythm.  Respiratory: Effort normal and breath sounds normal.  Musculoskeletal:     Left shoulder: She exhibits normal range of motion, no tenderness and no pain.  Skin: Skin is warm and dry.  No bruising  Psychiatric: She has a normal mood and affect.           Assessment & Plan:

## 2019-06-02 NOTE — Patient Instructions (Signed)
We've reduced the dose of your losartan to 50 mg. Take this once daily for blood pressure.  Please send me some blood pressure readings and update me in regards to your dizziness in 2 weeks.  It was a pleasure to see you today!

## 2019-06-02 NOTE — Assessment & Plan Note (Signed)
Borderline high today, home readings are better. Improved on second BP check today.  Will reduce losartan to 50 mg in at attempt to decrease postural dizziness. She will monitor BP and update in 2 weeks. Consider cardiology evaluation.

## 2019-06-07 ENCOUNTER — Ambulatory Visit: Payer: Self-pay | Admitting: Oncology

## 2019-06-07 ENCOUNTER — Other Ambulatory Visit: Payer: Self-pay

## 2019-06-15 ENCOUNTER — Other Ambulatory Visit: Payer: Self-pay | Admitting: Neurology

## 2019-06-21 DIAGNOSIS — I10 Essential (primary) hypertension: Secondary | ICD-10-CM

## 2019-06-21 DIAGNOSIS — R42 Dizziness and giddiness: Secondary | ICD-10-CM

## 2019-06-28 ENCOUNTER — Other Ambulatory Visit: Payer: Self-pay

## 2019-06-28 ENCOUNTER — Ambulatory Visit: Payer: Medicare Other | Admitting: Internal Medicine

## 2019-06-28 ENCOUNTER — Encounter: Payer: Self-pay | Admitting: Internal Medicine

## 2019-06-28 ENCOUNTER — Ambulatory Visit (INDEPENDENT_AMBULATORY_CARE_PROVIDER_SITE_OTHER): Payer: Medicare Other

## 2019-06-28 VITALS — BP 138/70 | HR 82 | Temp 97.8°F | Ht 62.0 in | Wt 119.2 lb

## 2019-06-28 DIAGNOSIS — J9611 Chronic respiratory failure with hypoxia: Secondary | ICD-10-CM

## 2019-06-28 DIAGNOSIS — J449 Chronic obstructive pulmonary disease, unspecified: Secondary | ICD-10-CM

## 2019-06-28 NOTE — Assessment & Plan Note (Signed)
Inhaled meds seem to be working well. Plan- CXR for update

## 2019-06-28 NOTE — Assessment & Plan Note (Addendum)
HST showed desaturation on room air for that study. She may have occasional night with more apnea events, as explained to dtr, but she does not have OSA. Does sleep on sides.  Plan- increase O2 to 2.5L esp during sleep. See if that helps the occasionally noted morning waking with anxiety and tachycardia.

## 2019-06-28 NOTE — Progress Notes (Signed)
MRN# [id number] Amanda Porter 25-Feb-1946 HPI- Amanda Porter - F former smoker followed for COPD, Chronic hypoxic respiratory failure, complicated by dementia/ TIAs/ Normal Pressure Hydrocephalus/ VP shunt 12/09/16,  LULobectomy for lung Ca 2000, Dysphagia solids/ liquids, Hx DVT/PE/ L femoral embolectomy 1998, DM2 **Desat walk 09/30/17; baseline sat was 95% and HR 83. After walking 360 feet sat was 86% and HR 120, added oxygen at 2L and sat increased to 92%   --------------------------------------------------------------------------------- 02/24/2019- 72 yoF former smoker followed for COPD, Chronic hypoxic respiratory failure, complicated by dementia/ TIAs/ Normal Pressure Hydrocephalus/ VP shunt 12/09/16,  LULobectomy for lung Ca 2000, Dysphagia solids/ liquids, Hx DVT/PE/ L femoral embolectomy 1998, DM2 O2 2L continuous and POC. Spiriva handihaler, neb albuterol, albuterol hfa,   -----referred by PCP [name]; on 2 L O2 continuous; SOB on extertion, cough w/ yellow phlegm; on Spiriva & notes it helps; needs prescription for just albuterol neb solution She had seen Dr Ashby Dawes in [place] last year as she transitioned moving from [place] to now live here with daughter who is an EMT, because of dementia.  She had started [facility] but dropped because of her move.  Daughter reports labored breathing at night and asks about sleep study when Covid restrictions are relaxed. Frequently wakes SOB. She now seeks to establish here, indicating she feels stable. She minimizes symptoms but coughs some on many days of each week. Sputum has been intermittently yellow-brown, no blood, no fever. Compliant with O2.. The hose connection is coming loose on her POC.and needs service.  CXR 03/24/18- IMPRESSION: Postoperative change on the left with scarring and volume loss. No edema or consolidation. Stable cardiac silhouette. There is aortic atherosclerosis. Aortic Atherosclerosis  (ICD10-I70.0).  06/28/2019-  72 yoF former smoker followed for COPD, Chronic hypoxic respiratory failure, complicated by dementia/ TIAs/ Normal Pressure Hydrocephalus/ VP shunt 12/09/16,  LULobectomy for lung Ca 2000, Dysphagia solids/ liquids, Hx DVT/PE/ L femoral embolectomy 1998, DM2 O2 2L continuous and POC/ APS Spiriva handihaler, neb albuterol, albuterol hfa, Daughter here today. They do want to continue care through this office. HST 06/23/2019- AHI 3.9/ hr, desat on room air to 83% with mean sat 89%, body weight 114 lbs.   Recommended continue current sleep O2 2.5L. Followed by Neuro and Psychiatry.  -----breathing at baseline, pt on O2 2L POC & continuous, DME: [facility] POC wasn't working on arrival- placed on our tank for visit. Daughter has already contacted [facility]PS for service. Occasionally noted to wake in AM with rapid pulse and anxious. Has been using O2 at 2L. Feels a "little woozy" - says BP meds being adjusted.  Little cough or wheeze. Spiriva works ok.   ROS-see HPI   + = positive Constitutional:    weight loss, night sweats, fevers, chills, fatigue, lassitude. HEENT:    headaches, difficulty swallowing, tooth/dental problems, sore throat,       sneezing, itching, ear ache, nasal congestion, post nasal drip, snoring CV:    chest pain, orthopnea, PND, swelling in lower extremities, anasarca,                                  dizziness, palpitations Resp:   +shortness of breath with exertion or at rest.                productive cough,   non-productive cough, coughing up of blood.  change in color of mucus.  +wheezing.   Skin:    rash or lesions. GI:  No-   heartburn, +indigestion, abdominal pain, nausea, vomiting, diarrhea,                 change in bowel habits, loss of appetite GU: dysuria, change in color of urine, no urgency or frequency.   flank pain. MS:  + joint pain, stiffness, decreased range of motion, back pain. Neuro-     + HPI Psych:  change in  mood or affect.  +depression or +anxiety.   memory loss.  OBJ- Physical Exam General- Alert, Oriented, Affect-appropriate, Distress- none acute, O2 2L pulse Skin- rash-none, lesions- none, excoriation- none Lymphadenopathy- none Head- atraumatic            Eyes- Gross vision intact, PERRLA, conjunctivae and secretions clear            Ears- Hearing, canals-normal            Nose- Clear, no-Septal dev, mucus, polyps, erosion, perforation             Throat- Mallampati II , mucosa clear , drainage- none, tonsils- atrophic Neck- flexible , trachea midline, no stridor , thyroid nl, carotid no bruit Chest - symmetrical excursion , unlabored           Heart/CV- RRR , no murmur , no gallop  , no rub, nl s1 s2                           - JVD- none , edema- none, stasis changes- none, varices- none           Lung- clear to P&A, wheeze- none, cough- none , dullness-none, rub- none           Chest wall-  Abd-  Br/ Gen/ Rectal- Not done, not indicated Extrem- cyanosis- none, clubbing, none, atrophy- none, strength- nl Neuro- grossly intact to observation

## 2019-06-28 NOTE — Patient Instructions (Addendum)
Order- CXR  Dx COPD mixed type, remote LUL lobectomy for Ca  Order- schedule PFT   Dx COPD mixed type  Suggest you leave the oxygen set at 2.5 L. See if you feel a little better in the mornings. Please get APS to fix your POC as discussed.

## 2019-07-12 ENCOUNTER — Other Ambulatory Visit: Payer: Self-pay

## 2019-07-12 ENCOUNTER — Inpatient Hospital Stay: Payer: Medicare Other | Attending: Oncology

## 2019-07-12 ENCOUNTER — Inpatient Hospital Stay (HOSPITAL_BASED_OUTPATIENT_CLINIC_OR_DEPARTMENT_OTHER): Payer: Medicare Other | Admitting: Oncology

## 2019-07-12 VITALS — BP 155/69 | HR 98 | Temp 98.5°F | Resp 18 | Ht 62.0 in | Wt 121.1 lb

## 2019-07-12 DIAGNOSIS — D509 Iron deficiency anemia, unspecified: Secondary | ICD-10-CM | POA: Insufficient documentation

## 2019-07-12 LAB — CBC WITH DIFFERENTIAL (CANCER CENTER ONLY)
Abs Immature Granulocytes: 0.01 10*3/uL (ref 0.00–0.07)
Basophils Absolute: 0 10*3/uL (ref 0.0–0.1)
Basophils Relative: 1 %
Eosinophils Absolute: 0.2 10*3/uL (ref 0.0–0.5)
Eosinophils Relative: 3 %
HCT: 31.9 % — ABNORMAL LOW (ref 36.0–46.0)
Hemoglobin: 10.4 g/dL — ABNORMAL LOW (ref 12.0–15.0)
Immature Granulocytes: 0 %
Lymphocytes Relative: 43 %
Lymphs Abs: 3.5 10*3/uL (ref 0.7–4.0)
MCH: 30.7 pg (ref 26.0–34.0)
MCHC: 32.6 g/dL (ref 30.0–36.0)
MCV: 94.1 fL (ref 80.0–100.0)
Monocytes Absolute: 0.8 10*3/uL (ref 0.1–1.0)
Monocytes Relative: 10 %
Neutro Abs: 3.4 10*3/uL (ref 1.7–7.7)
Neutrophils Relative %: 43 %
Platelet Count: 284 10*3/uL (ref 150–400)
RBC: 3.39 MIL/uL — ABNORMAL LOW (ref 3.87–5.11)
RDW: 13.1 % (ref 11.5–15.5)
WBC Count: 8 10*3/uL (ref 4.0–10.5)
nRBC: 0 % (ref 0.0–0.2)

## 2019-07-12 NOTE — Progress Notes (Signed)
Hematology and Oncology Follow Up Visit  Amanda Porter [id number] 1946/08/19 73 y.o. 07/12/2019 3:43 PM Amanda Porter, NPClark, Amanda Penna, NP   Principle Diagnosis: 73 year old woman with iron deficiency anemia related to chronic blood losses without any GI source noted in 2019.     Prior Therapy: Status post Feraheme infusion given in August 2019.  She received a total of 1000 mg at that time.  Current therapy: Oral iron replacement once a day.  Interim History: Amanda Porter is here for a follow-up.  Since her last visit, she reports no major changes in her health.  She does report periodic dyspnea especially associated with humid weather continues to be oxygen dependent.  She tolerates oral iron without any major complications.  She denies any abdominal pain, constipation or dyspepsia.  She denies any hematochezia or melena.  She is limited in her mobility related to her respiratory status.  She denied headaches, blurry vision, syncope or seizures.  Denies any fevers, chills or sweats.  Denied chest pain, palpitation, orthopnea or leg edema.  Denied cough, wheezing or hemoptysis.  Denied nausea, vomiting or abdominal pain.  Denies any constipation or diarrhea.  Denies any frequency urgency or hesitancy.  Denies any arthralgias or myalgias.  Denies any skin rashes or lesions.  Denies any bleeding or clotting tendency.  Denies any easy bruising.  Denies any hair or nail changes.  Denies any anxiety or depression.  Remaining review of system is negative.     Medications: Updated on review. Current Outpatient Medications  Medication Sig Dispense Refill  . albuterol (PROVENTIL HFA;VENTOLIN HFA) 108 (90 Base) MCG/ACT inhaler INHALE 2 PUFFS BY MOUTH EVERY 4 HOURS AS NEEDED DX: J44.9 8.5 Inhaler 2  . albuterol (PROVENTIL) (2.5 MG/3ML) 0.083% nebulizer solution Take 3 mLs by nebulization every 6 (six) hours as needed. 150 mL 12  . alendronate (FOSAMAX) 70 MG tablet Take 1 tablet by  mouth once weekly on an empty stomach with water only. Do not lay flat for one hour. 12 tablet 3  . busPIRone (BUSPAR) 15 MG tablet 1  qam   2   qpm 90 tablet 5  . clopidogrel (PLAVIX) 75 MG tablet TAKE 1 TABLET BY MOUTH EVERY DAY 90 tablet 1  . Ferrous Sulfate (IRON PO) Take by mouth.    . gabapentin (NEURONTIN) 100 MG capsule TAKE 1 CAPSULE BY MOUTH THREE TIMES A DAY 270 capsule 1  . hydrOXYzine (VISTARIL) 25 MG capsule TAKE 1 CAPSULE BY MOUTH TWICE A DAY OR 1-2 CAPSULES ONCE A DAY AS NEEDED 360 capsule 2  . losartan (COZAAR) 50 MG tablet Take 1 tablet (50 mg total) by mouth daily. For blood pressure. (Patient taking differently: Take 25 mg by mouth daily. For blood pressure.) 90 tablet 0  . Melatonin 5 MG TABS Take 5 mg by mouth at bedtime.     . Multiple Vitamin (MULTIVITAMIN) capsule Take 1 capsule by mouth daily.    . ondansetron (ZOFRAN ODT) 4 MG disintegrating tablet Take 1 tablet (4 mg total) by mouth every 8 (eight) hours as needed for nausea or vomiting. 20 tablet 0  . OXYGEN 2 L continuous.     . rosuvastatin (CRESTOR) 5 MG tablet TAKE 1 TABLET BY MOUTH EVERY DAY IN THE EVENING 90 tablet 0  . tiotropium (SPIRIVA HANDIHALER) 18 MCG inhalation capsule Place 1 capsule (18 mcg total) into inhaler and inhale daily. 30 capsule 0   No current facility-administered medications for this visit.  Allergies:  Allergies  Allergen Reactions  . Lexapro [Escitalopram Oxalate] Other (See Comments)    Dizziness, nausea, fatigue  . Neosporin  [Neomycin-Polymyxin-Gramicidin] Rash    (Neosporin)    Past Medical History, Surgical history, Social history, and Family History without any changes on review.   Physical Exam: Blood pressure (!) 155/69, pulse 98, temperature 98.5 F (36.9 C), temperature source Oral, resp. rate 18, height 5\' 2"  (1.575 m), weight 121 lb 1.6 oz (54.9 kg), SpO2 95 %.   ECOG: 1   General appearance: Comfortable appearing without any discomfort Head:  Normocephalic without any trauma Oropharynx: Mucous membranes are moist and pink without any thrush or ulcers. Eyes: Pupils are equal and round reactive to light. Lymph nodes: No cervical, supraclavicular, inguinal or axillary lymphadenopathy.   Heart:regular rate and rhythm.  S1 and S2 without leg edema. Lung: Clear without any rhonchi or wheezes.  No dullness to percussion. Abdomin: Soft, nontender, nondistended with good bowel sounds.  No hepatosplenomegaly. Musculoskeletal: No joint deformity or effusion.  Full range of motion noted. Neurological: No deficits noted on motor, sensory and deep tendon reflex exam. Skin: No petechial rash or dryness.  Appeared moist.     Lab Results: Lab Results  Component Value Date   WBC 8.0 07/12/2019   HGB 10.4 (L) 07/12/2019   HCT 31.9 (L) 07/12/2019   MCV 94.1 07/12/2019   PLT 284 07/12/2019     Chemistry      Component Value Date/Time   NA 141 12/14/2018 1455   K 3.9 12/14/2018 1455   CL 103 12/14/2018 1455   CO2 31 12/14/2018 1455   BUN 19 12/14/2018 1455   CREATININE 0.59 12/14/2018 1455      Component Value Date/Time   CALCIUM 10.2 12/14/2018 1455   ALKPHOS 61 09/15/2017 0815   AST 15 09/15/2017 0815   ALT 12 09/15/2017 0815   BILITOT 0.2 09/15/2017 0815      Results for [name], [name] (MRN [id number]) as of 07/12/2019 15:30  Ref. Range 02/01/2019 11:41  Iron Latest Ref Range: 41 - 142 ug/dL 56  UIBC Latest Ref Range: 120 - 384 ug/dL 237  TIBC Latest Ref Range: 236 - 444 ug/dL 293  Saturation Ratios Latest Ref Range: 21 - 57 % 19 (L)      Impression and Plan:  73 year old woman with:  1.  Iron deficiency anemia related to chronic GI blood losses diagnosed in 2019.  She has received intravenous iron in the past and currently on oral iron replacement.  Iron levels in March 2020 showed stabilization although slightly drifting since that time.  Her hemoglobin today currently at 10.4 which showed a slight decline  from 11.56 months ago.  Iron studies are currently pending.  Risks and benefits of continuing oral iron versus intravenous iron replacement was reviewed.  Complications associated with Feraheme infusion chronic includes arthralgias, myalgias and infusion related complications and rarely anaphylaxis.  After discussion today, she opted to continue with oral iron replacement which we will consider increasing of her iron studies showed decline on iron levels.   2.  Age-appropriate cancer screening: She is up-to-date without any signs or symptoms to suggest malignancy.  She will have colonoscopy in the past.   3.  Follow-up: In 6 months for repeat evaluation.  15 minutes was spent with the patient face-to-face today.  More than 50% of time was dedicated to reviewing her laboratory data, treatment options and complications related therapy.     Zola Button, MD  8/11/20203:43 PM

## 2019-07-13 LAB — IRON AND TIBC
Iron: 40 ug/dL — ABNORMAL LOW (ref 41–142)
Saturation Ratios: 14 % — ABNORMAL LOW (ref 21–57)
TIBC: 283 ug/dL (ref 236–444)
UIBC: 242 ug/dL (ref 120–384)

## 2019-07-13 LAB — FERRITIN: Ferritin: 140 ng/mL (ref 11–307)

## 2019-07-18 ENCOUNTER — Encounter: Payer: Self-pay | Admitting: Oncology

## 2019-07-25 ENCOUNTER — Telehealth (HOSPITAL_COMMUNITY): Payer: Self-pay

## 2019-07-25 NOTE — Telephone Encounter (Signed)
Patients daughter is calling. She is concerned the since increasing the Hydroxyzine her mothers TD has gotten progressively worse. She said the medication does help, but the mouth movements are causing sores and her mom can not seem to stop, or even notice that she is doing it... Please review and advise, thank you

## 2019-08-01 ENCOUNTER — Telehealth (HOSPITAL_COMMUNITY): Payer: Self-pay

## 2019-08-01 NOTE — Telephone Encounter (Signed)
Patients daughter called, she wants you to know that her mom is feeling better since stopping the Vistaril and increasing the Buspar. She states the mouth movements have gotten better, however, now her anxiety has increased. Patients daughter states that the Buspar helps some, but wants to know if there is anything else her mother can try. Please review and advise, thank you

## 2019-08-02 NOTE — Telephone Encounter (Signed)
This is Dr. Karen Chafe patient and he just increased dose of Buspar a week ago?

## 2019-08-02 NOTE — Telephone Encounter (Signed)
Sorry, sent to the wrong doctor. Could you please review the messages below and advise? Thank you

## 2019-08-04 NOTE — Telephone Encounter (Signed)
I called patients daughter and let her know that at this time Dr. Mamie Nick is not going to make any changes. I let her know that the Buspar could take a week or two to get into her system and really work properly.

## 2019-08-09 ENCOUNTER — Ambulatory Visit: Payer: Medicare Other | Admitting: Cardiology

## 2019-08-17 ENCOUNTER — Ambulatory Visit (INDEPENDENT_AMBULATORY_CARE_PROVIDER_SITE_OTHER): Payer: Medicare Other | Admitting: Cardiology

## 2019-08-17 ENCOUNTER — Other Ambulatory Visit: Payer: Self-pay

## 2019-08-17 VITALS — BP 146/62 | HR 87 | Temp 98.4°F | Ht 62.0 in | Wt 121.6 lb

## 2019-08-17 DIAGNOSIS — R42 Dizziness and giddiness: Secondary | ICD-10-CM | POA: Diagnosis not present

## 2019-08-17 DIAGNOSIS — R0989 Other specified symptoms and signs involving the circulatory and respiratory systems: Secondary | ICD-10-CM | POA: Diagnosis not present

## 2019-08-17 DIAGNOSIS — Z8673 Personal history of transient ischemic attack (TIA), and cerebral infarction without residual deficits: Secondary | ICD-10-CM | POA: Diagnosis not present

## 2019-08-17 DIAGNOSIS — Z7189 Other specified counseling: Secondary | ICD-10-CM | POA: Diagnosis not present

## 2019-08-17 NOTE — Progress Notes (Signed)
Cardiology Office Note:    Date:  08/17/2019   ID:  Amanda Porter, DOB Jul 17, 1946, MRN [id number]  PCP:  Pleas Koch, NP  Cardiologist:  Buford Dresser, MD PhD  Referring MD: Pleas Koch, NP   CC: new patient consult for dizziness  History of Present Illness:    Amanda Porter is a 73 y.o. female with a hx of CVA, COPD, type II diabetes, NPH, dementia, anxiety, depression, dizziness, hypertension who is seen as a new consult at the request of Pleas Koch, NP for the evaluation and management of dizziness.  Concerns today: Two years ago, dizziness started with positional changes. Has been to vision, ENT, neurology. Had shunt placed in 2018, revised in 2019, with no change in dizziness. Has been told it is not vertigo, neurology did not feel it is orthostasis.  Daughter here today, is EMT. Has been checking home BP, diastolic numbers have been in the 40s/50s. BP meds have been weaned down to 1/4 of what they were. Manual Bps were 140s/50s before taking AM meds. None higher than [id number] systolic. No syncope--has dizziness resulting in a fall (event noted in 06/02/19 notes).  Has had two sleep studies, negative for sleep apnea. Did have O2 at night bumped from 3L to 2 L without change.   Only taking 25 mg losartan as only BP med. No chest pain. Breathing is at her baseline. No LE edema. No PND, steady 2 pillow/wedge sleeping.   Dizziness is 24/7, but no more recent falls.   Denies chest pain, shortness of breath at rest or with normal exertion. No PND, orthopnea, LE edema or unexpected weight gain. No syncope or palpitations.  Past Medical History:  Diagnosis Date  . Anxiety   . Anxiety and depression   . COPD (chronic obstructive pulmonary disease) ([place])   . CVA (cerebral vascular accident) ([place])   . Dementia ([place])   . Depression   . Diabetes mellitus without complication ([place])   . Dysphasia   . History of lung cancer   . Hyperlipidemia   .  Memory disturbance 11/20/2017  . Normal pressure hydrocephalus ([place]) 2018  . Retinal detachment    Right  . Right carotid bruit 11/20/2017  . Thrombosis    Arterial to lower extremity?    Past Surgical History:  Procedure Laterality Date  . LUNG REMOVAL, PARTIAL     left upper lobe  . VENTRICULOPERITONEAL SHUNT  12/09/2016    Current Medications: Current Outpatient Medications on File Prior to Visit  Medication Sig  . albuterol (PROVENTIL HFA;VENTOLIN HFA) 108 (90 Base) MCG/ACT inhaler INHALE 2 PUFFS BY MOUTH EVERY 4 HOURS AS NEEDED DX: J44.9  . albuterol (PROVENTIL) (2.5 MG/3ML) 0.083% nebulizer solution Take 3 mLs by nebulization every 6 (six) hours as needed.  Marland Kitchen alendronate (FOSAMAX) 70 MG tablet Take 1 tablet by mouth once weekly on an empty stomach with water only. Do not lay flat for one hour.  . busPIRone (BUSPAR) 15 MG tablet 1  qam   2   qpm  . clopidogrel (PLAVIX) 75 MG tablet TAKE 1 TABLET BY MOUTH EVERY DAY  . Ferrous Sulfate (IRON PO) Take by mouth.  . gabapentin (NEURONTIN) 100 MG capsule TAKE 1 CAPSULE BY MOUTH THREE TIMES A DAY  . hydrOXYzine (VISTARIL) 25 MG capsule TAKE 1 CAPSULE BY MOUTH TWICE A DAY OR 1-2 CAPSULES ONCE A DAY AS NEEDED  . losartan (COZAAR) 50 MG tablet Take 1 tablet (50 mg total) by mouth daily. For  blood pressure. (Patient taking differently: Take 25 mg by mouth daily. For blood pressure.)  . Melatonin 5 MG TABS Take 5 mg by mouth at bedtime.   . Multiple Vitamin (MULTIVITAMIN) capsule Take 1 capsule by mouth daily.  . ondansetron (ZOFRAN ODT) 4 MG disintegrating tablet Take 1 tablet (4 mg total) by mouth every 8 (eight) hours as needed for nausea or vomiting.  . OXYGEN 2 L continuous.   . rosuvastatin (CRESTOR) 5 MG tablet TAKE 1 TABLET BY MOUTH EVERY DAY IN THE EVENING  . tiotropium (SPIRIVA HANDIHALER) 18 MCG inhalation capsule Place 1 capsule (18 mcg total) into inhaler and inhale daily.   No current facility-administered medications on  file prior to visit.      Allergies:   Lexapro [escitalopram oxalate] and Neosporin  [neomycin-polymyxin-gramicidin]   Social History   Socioeconomic History  . Marital status: Divorced    Spouse name: Not on file  . Number of children: 1  . Years of education: Not on file  . Highest education level: Not on file  Occupational History  . Occupation: Retired  Scientific laboratory technician  . Financial resource strain: Not on file  . Food insecurity    Worry: Not on file    Inability: Not on file  . Transportation needs    Medical: Not on file    Non-medical: Not on file  Tobacco Use  . Smoking status: Former Smoker    Packs/day: 0.50    Years: 50.00    Pack years: 25.00    Quit date: 08/01/2017    Years since quitting: 2.0  . Smokeless tobacco: Never Used  Substance and Sexual Activity  . Alcohol use: No  . Drug use: No  . Sexual activity: Not Currently  Lifestyle  . Physical activity    Days per week: Not on file    Minutes per session: Not on file  . Stress: Not on file  Relationships  . Social Herbalist on phone: Not on file    Gets together: Not on file    Attends religious service: Not on file    Active member of club or organization: Not on file    Attends meetings of clubs or organizations: Not on file    Relationship status: Not on file  Other Topics Concern  . Not on file  Social History Narrative   Moved from [place].   Lives with daughter.   Right handed     Family History: The patient's family history includes Cancer in her father; Diabetes in her sister and sister; Pneumonia in her mother. There is no history of Breast cancer.  ROS:   Please see the history of present illness.  Additional pertinent ROS: Constitutional: Negative for chills, fever, night sweats, unintentional weight loss  HENT: Negative for ear pain and hearing loss.   Eyes: Negative for loss of vision and eye pain.  Respiratory: Negative for cough, sputum, wheezing.   Cardiovascular:  See HPI. Gastrointestinal: Negative for abdominal pain, melena, and hematochezia.  Genitourinary: Negative for dysuria and hematuria.  Musculoskeletal: Negative for falls and myalgias.  Skin: Negative for itching and rash.  Neurological: Negative for focal weakness, focal sensory changes and loss of consciousness. Positive for dizziness Endo/Heme/Allergies: Does not bruise/bleed easily.     EKGs/Labs/Other Studies Reviewed:    The following studies were reviewed today: Carotid duplex 12/22/2017 Final Interpretation: Right Carotid: Velocities in the right ICA are consistent with a 1-39% stenosis.  Left Carotid: Velocities in  the left ICA are consistent with a 1-39% stenosis.  Vertebrals: Both vertebral arteries were patent with antegrade flow.  EKG:  EKG is personally reviewed.  The ekg ordered today demonstrates SR, iRBBB  Recent Labs: 12/14/2018: BUN 19; Creatinine, Ser 0.59; Potassium 3.9; Sodium 141 07/12/2019: Hemoglobin 10.4; Platelet Count 284  Recent Lipid Panel    Component Value Date/Time   CHOL 157 09/15/2017 0815   TRIG 105.0 09/15/2017 0815   HDL 58.80 09/15/2017 0815   CHOLHDL 3 09/15/2017 0815   VLDL 21.0 09/15/2017 0815   LDLCALC 77 09/15/2017 0815    Physical Exam:    VS:  BP (!) 146/62   Pulse 87   Temp 98.4 F (36.9 C) (Temporal)   Ht 5\' 2"  (1.575 m)   Wt 121 lb 9.6 oz (55.2 kg)   SpO2 99%   BMI 22.24 kg/m     Wt Readings from Last 3 Encounters:  07/12/19 121 lb 1.6 oz (54.9 kg)  06/28/19 119 lb 3.2 oz (54.1 kg)  06/02/19 118 lb 8 oz (53.8 kg)    GEN: Well nourished, well developed in no acute distress HEENT: Normal, moist mucous membranes NECK: No JVD CARDIAC: regular rhythm, normal S1 and S2, no rubs, gallops. 1/6 HSM, no diastolic murmur appreciated VASCULAR: Radial and DP pulses 2+ bilaterally. No carotid bruits RESPIRATORY:  Clear to auscultation without rales, wheezing or rhonchi  ABDOMEN: Soft, non-tender, non-distended  MUSCULOSKELETAL:  Ambulates independently SKIN: Warm and dry, no edema NEUROLOGIC:  Alert and oriented x 3. No focal neuro deficits noted. PSYCHIATRIC:  Normal affect    ASSESSMENT:    1. Lightheadedness   2. Widened pulse pressure   3. Cardiac risk counseling   4. Counseling on health promotion and disease prevention   5. History of CVA (cerebrovascular accident)    PLAN:    Dizziness/lightheadedness: feeling poorly today, orthostatics not done -has seen neurology and ENT. Not thought to be vertigo, concern that it might be orthostatic -unclear etiology. Reports low diastolic BP (below)  -has resulted in injury or falls  Reported widened pulse pressures/low diastolic blood pressure: -low diastolic numbers may be leading to hypoperfusion and symptoms -will check echo today, rule out valvular abnormalities -will stop losartan and see if symptoms improve  Cardiac risk counseling and prevention recommendations: -recommend heart healthy/Mediterranean diet, with whole grains, fruits, vegetable, fish, lean meats, nuts, and olive oil. Limit salt. -recommend moderate walking, 3-5 times/week for 30-50 minutes each session. Aim for at least 150 minutes.week. Goal should be pace of 3 miles/hours, or walking 1.5 miles in 30 minutes -recommend avoidance of tobacco products. Avoid excess alcohol. -reported history of CVA, on clopidogrel and rosuvastatin for secondary prevention  Plan for follow up: 4 weeks to monitor symptoms with holding BP meds  Medication Adjustments/Labs and Tests Ordered: Current medicines are reviewed at length with the patient today.  Concerns regarding medicines are outlined above.  Orders Placed This Encounter  Procedures  . EKG 12-Lead  . ECHOCARDIOGRAM COMPLETE   No orders of the defined types were placed in this encounter.   Patient Instructions  Medication Instructions:  Stop: Losartan 25 mg daily  If you need a refill on your cardiac medications before  your next appointment, please call your pharmacy.   Lab work: None  Testing/Procedures: Your physician has requested that you have an echocardiogram. Echocardiography is a painless test that uses sound waves to create images of your heart. It provides your doctor with information about the size and  shape of your heart and how well your heart's chambers and valves are working. This procedure takes approximately one hour. There are no restrictions for this procedure. [place] [address]   Follow-Up: At [facility], you and your health needs are our priority.  As part of our continuing mission to provide you with exceptional heart care, we have created designated Provider Care Teams.  These Care Teams include your primary Cardiologist (physician) and Advanced Practice Providers (APPs -  Physician Assistants and Nurse Practitioners) who all work together to provide you with the care you need, when you need it. You will need a follow up appointment in 4 weeks.  Please call our office 2 months in advance to schedule this appointment.  You may see Dr. Harrell Gave or one of the following Advanced Practice Providers on your designated Care Team:   Rosaria Ferries, PA-C . Jory Sims, DNP, ANP      Signed, Buford Dresser, MD PhD 08/17/2019  [place]

## 2019-08-17 NOTE — Patient Instructions (Addendum)
Medication Instructions:  Stop: Losartan 25 mg daily  If you need a refill on your cardiac medications before your next appointment, please call your pharmacy.   Lab work: None  Testing/Procedures: Your physician has requested that you have an echocardiogram. Echocardiography is a painless test that uses sound waves to create images of your heart. It provides your doctor with information about the size and shape of your heart and how well your heart's chambers and valves are working. This procedure takes approximately one hour. There are no restrictions for this procedure. [place] [address]   Follow-Up: At [facility], you and your health needs are our priority.  As part of our continuing mission to provide you with exceptional heart care, we have created designated Provider Care Teams.  These Care Teams include your primary Cardiologist (physician) and Advanced Practice Providers (APPs -  Physician Assistants and Nurse Practitioners) who all work together to provide you with the care you need, when you need it. You will need a follow up appointment in 4 weeks.  Please call our office 2 months in advance to schedule this appointment.  You may see Dr. Harrell Gave or one of the following Advanced Practice Providers on your designated Care Team:   Rosaria Ferries, PA-C . Jory Sims, DNP, ANP

## 2019-08-25 ENCOUNTER — Ambulatory Visit (HOSPITAL_COMMUNITY): Payer: Medicare Other | Attending: Internal Medicine

## 2019-08-25 ENCOUNTER — Other Ambulatory Visit: Payer: Self-pay

## 2019-08-25 DIAGNOSIS — E785 Hyperlipidemia, unspecified: Secondary | ICD-10-CM | POA: Diagnosis not present

## 2019-08-25 DIAGNOSIS — C3412 Malignant neoplasm of upper lobe, left bronchus or lung: Secondary | ICD-10-CM | POA: Insufficient documentation

## 2019-08-25 DIAGNOSIS — R0989 Other specified symptoms and signs involving the circulatory and respiratory systems: Secondary | ICD-10-CM | POA: Diagnosis present

## 2019-08-25 DIAGNOSIS — E119 Type 2 diabetes mellitus without complications: Secondary | ICD-10-CM | POA: Insufficient documentation

## 2019-08-25 DIAGNOSIS — I348 Other nonrheumatic mitral valve disorders: Secondary | ICD-10-CM | POA: Insufficient documentation

## 2019-08-25 DIAGNOSIS — Z87891 Personal history of nicotine dependence: Secondary | ICD-10-CM | POA: Diagnosis not present

## 2019-08-25 DIAGNOSIS — R42 Dizziness and giddiness: Secondary | ICD-10-CM | POA: Insufficient documentation

## 2019-08-28 ENCOUNTER — Other Ambulatory Visit: Payer: Self-pay | Admitting: Primary Care

## 2019-08-28 DIAGNOSIS — I10 Essential (primary) hypertension: Secondary | ICD-10-CM

## 2019-08-28 DIAGNOSIS — E785 Hyperlipidemia, unspecified: Secondary | ICD-10-CM

## 2019-08-29 NOTE — Telephone Encounter (Signed)
Last prescribed on 05/31/2019 . Last appointment on 06/02/2019. No future appointment  It shows that Losartan is no longer on current medication list

## 2019-08-29 NOTE — Telephone Encounter (Signed)
Noted, refill provided for Crestor.

## 2019-08-31 ENCOUNTER — Telehealth: Payer: Self-pay

## 2019-08-31 ENCOUNTER — Other Ambulatory Visit: Payer: Self-pay | Admitting: Primary Care

## 2019-08-31 DIAGNOSIS — E785 Hyperlipidemia, unspecified: Secondary | ICD-10-CM

## 2019-08-31 DIAGNOSIS — I10 Essential (primary) hypertension: Secondary | ICD-10-CM

## 2019-08-31 NOTE — Telephone Encounter (Signed)
Letter has been mailed to the pt asking for her to call and reschedule her 12/06/2019 appt due to provider schedule change.

## 2019-09-05 ENCOUNTER — Encounter: Payer: Self-pay | Admitting: Cardiology

## 2019-09-05 DIAGNOSIS — Z8673 Personal history of transient ischemic attack (TIA), and cerebral infarction without residual deficits: Secondary | ICD-10-CM | POA: Insufficient documentation

## 2019-09-05 DIAGNOSIS — R42 Dizziness and giddiness: Secondary | ICD-10-CM | POA: Insufficient documentation

## 2019-09-11 ENCOUNTER — Other Ambulatory Visit: Payer: Self-pay

## 2019-09-11 DIAGNOSIS — E785 Hyperlipidemia, unspecified: Secondary | ICD-10-CM

## 2019-09-13 NOTE — Telephone Encounter (Addendum)
Amanda Porter (DPR signed) left v/m wanting to know why Crestor refill was denied and Amanda Porter request cb from [place]. Pt took last Crestor today.

## 2019-09-14 MED ORDER — ROSUVASTATIN CALCIUM 5 MG PO TABS: 5.0000 mg | ORAL_TABLET | Freq: Every evening | ORAL | 0 refills | Status: DC

## 2019-09-14 NOTE — Addendum Note (Signed)
Addended by: Jacqualin Combes on: 09/14/2019 12:32 PM   Modules accepted: Orders

## 2019-09-19 ENCOUNTER — Ambulatory Visit: Payer: Medicare Other | Admitting: Cardiology

## 2019-09-19 NOTE — Telephone Encounter (Signed)
Spoken to [place] on 09/16/2019. Not sure what happen to refill that was sent earlier but resent Rx for patient.

## 2019-09-20 ENCOUNTER — Other Ambulatory Visit: Payer: Self-pay | Admitting: Primary Care

## 2019-09-20 DIAGNOSIS — I639 Cerebral infarction, unspecified: Secondary | ICD-10-CM

## 2019-09-20 NOTE — Telephone Encounter (Signed)
Last prescribed on 03/10/2019 . Last appointment on 06/02/2019. No future appointment but patient is schedule to see cardiologist on 10/302020

## 2019-09-21 NOTE — Telephone Encounter (Signed)
Noted, refill sent to pharmacy. 

## 2019-09-30 ENCOUNTER — Ambulatory Visit: Payer: Medicare Other | Admitting: Cardiology

## 2019-09-30 ENCOUNTER — Other Ambulatory Visit: Payer: Self-pay

## 2019-09-30 ENCOUNTER — Encounter: Payer: Self-pay | Admitting: Cardiology

## 2019-09-30 VITALS — BP 130/62 | HR 89 | Temp 97.0°F | Ht 62.0 in | Wt 121.0 lb

## 2019-09-30 DIAGNOSIS — Z712 Person consulting for explanation of examination or test findings: Secondary | ICD-10-CM

## 2019-09-30 DIAGNOSIS — Z7182 Exercise counseling: Secondary | ICD-10-CM | POA: Diagnosis not present

## 2019-09-30 DIAGNOSIS — R42 Dizziness and giddiness: Secondary | ICD-10-CM

## 2019-09-30 DIAGNOSIS — Z7189 Other specified counseling: Secondary | ICD-10-CM

## 2019-09-30 NOTE — Progress Notes (Signed)
Cardiology Office Note:    Date:  09/30/2019   ID:  Amanda Porter, DOB 1945/12/06, MRN [id number]  PCP:  Pleas Koch, NP  Cardiologist:  Buford Dresser, MD PhD  Referring MD: Pleas Koch, NP   CC: follow up  History of Present Illness:    Amanda Porter is a 73 y.o. female with a hx of CVA, COPD, type II diabetes, NPH, dementia, anxiety, depression, dizziness, hypertension who is seen for follow up today. She was initially seen on 08/17/19 as a new consult at the request of Pleas Koch, NP for the evaluation and management of dizziness. Please see initial consult note for full history.  Today: Daughter (EMT) here with her today. BP has varied, sometimes 160s/60s in the AM, but usually 130-140/60s. Better later in the day. Feels improvement in dizziness with stopping losartan. Trying to drink more water as well. Doesn't have to sit on the side of the bed as long before she gets up and starts moving. No falls/no syncope.  We reviewed results of the echo at length. We also discussed strengthening exercises. Discussed PT, recumbent bike as exercise.  Denies chest pain, shortness of breath at rest or with normal exertion. No PND, orthopnea, LE edema or unexpected weight gain. No syncope or palpitations.  Past Medical History:  Diagnosis Date  . Anxiety   . Anxiety and depression   . COPD (chronic obstructive pulmonary disease) ([place])   . CVA (cerebral vascular accident) ([place])   . Dementia ([place])   . Depression   . Diabetes mellitus without complication ([place])   . Dysphasia   . History of lung cancer   . Hyperlipidemia   . Memory disturbance 11/20/2017  . Normal pressure hydrocephalus ([place]) 2018  . Retinal detachment    Right  . Right carotid bruit 11/20/2017  . Thrombosis    Arterial to lower extremity?    Past Surgical History:  Procedure Laterality Date  . LUNG REMOVAL, PARTIAL     left upper lobe  . VENTRICULOPERITONEAL SHUNT  12/09/2016     Current Medications: Current Outpatient Medications on File Prior to Visit  Medication Sig  . albuterol (PROVENTIL HFA;VENTOLIN HFA) 108 (90 Base) MCG/ACT inhaler INHALE 2 PUFFS BY MOUTH EVERY 4 HOURS AS NEEDED DX: J44.9  . albuterol (PROVENTIL) (2.5 MG/3ML) 0.083% nebulizer solution Take 3 mLs by nebulization every 6 (six) hours as needed.  Marland Kitchen alendronate (FOSAMAX) 70 MG tablet Take 1 tablet by mouth once weekly on an empty stomach with water only. Do not lay flat for one hour.  . busPIRone (BUSPAR) 15 MG tablet 1  qam   2   qpm  . clopidogrel (PLAVIX) 75 MG tablet TAKE 1 TABLET BY MOUTH EVERY DAY  . Ferrous Sulfate (IRON PO) Take by mouth.  . gabapentin (NEURONTIN) 100 MG capsule TAKE 1 CAPSULE BY MOUTH THREE TIMES A DAY  . hydrOXYzine (VISTARIL) 25 MG capsule TAKE 1 CAPSULE BY MOUTH TWICE A DAY OR 1-2 CAPSULES ONCE A DAY AS NEEDED  . Multiple Vitamin (MULTIVITAMIN) capsule Take 1 capsule by mouth daily.  . OXYGEN 2 L continuous.   . rosuvastatin (CRESTOR) 5 MG tablet Take 1 tablet (5 mg total) by mouth every evening. For cholesterol  . tiotropium (SPIRIVA HANDIHALER) 18 MCG inhalation capsule Place 1 capsule (18 mcg total) into inhaler and inhale daily.   No current facility-administered medications on file prior to visit.      Allergies:   Lexapro [escitalopram oxalate] and Neosporin  [  neomycin-polymyxin-gramicidin]   Social History   Tobacco Use  . Smoking status: Former Smoker    Packs/day: 0.50    Years: 50.00    Pack years: 25.00    Quit date: 08/01/2017    Years since quitting: 2.1  . Smokeless tobacco: Never Used  Substance Use Topics  . Alcohol use: No  . Drug use: No    Family History: The patient's family history includes Cancer in her father; Diabetes in her sister and sister; Pneumonia in her mother. There is no history of Breast cancer.  ROS:   Please see the history of present illness.  Additional pertinent ROS: Constitutional: Negative for chills, fever,  night sweats, unintentional weight loss  HENT: Negative for ear pain and hearing loss.   Eyes: Negative for loss of vision and eye pain.  Respiratory: Negative for cough, sputum, wheezing.   Cardiovascular: See HPI. Gastrointestinal: Negative for abdominal pain, melena, and hematochezia.  Genitourinary: Negative for dysuria and hematuria.  Musculoskeletal: Negative for falls and myalgias.  Skin: Negative for itching and rash.  Neurological: Negative for focal weakness, focal sensory changes and loss of consciousness. Positive for longstanding lightheadedness/dizziness Endo/Heme/Allergies: Does not bruise/bleed easily.   EKGs/Labs/Other Studies Reviewed:    The following studies were reviewed today: Echo 08/25/19  1. Left ventricular ejection fraction, by visual estimation, is 65 to 70%. The left ventricle has normal function. Normal left ventricular size. There is no left ventricular hypertrophy.  2. Global right ventricle has normal systolic function.The right ventricular size is normal. No increase in right ventricular wall thickness.  3. Moderate calcification of the mitral valve leaflet(s).  4. Moderate mitral annular calcification.  5. Moderate thickening of the mitral valve leaflet(s).  6. The mitral valve is abnormal. No evidence of mitral valve regurgitation. No evidence of mitral stenosis.  Aortic Valve: The aortic valve is tricuspid. Aortic valve regurgitation is trivial by color flow Doppler. Aortic regurgitation PHT measures 512 msec.  Carotid duplex 12/22/2017 Final Interpretation: Right Carotid: Velocities in the right ICA are consistent with a 1-39% stenosis.  Left Carotid: Velocities in the left ICA are consistent with a 1-39% stenosis.  Vertebrals: Both vertebral arteries were patent with antegrade flow.  EKG:  EKG is personally reviewed.  The ekg ordered today demonstrates SR, iRBBB  Recent Labs: 12/14/2018: BUN 19; Creatinine, Ser 0.59; Potassium 3.9; Sodium  141 07/12/2019: Hemoglobin 10.4; Platelet Count 284  Recent Lipid Panel    Component Value Date/Time   CHOL 157 09/15/2017 0815   TRIG 105.0 09/15/2017 0815   HDL 58.80 09/15/2017 0815   CHOLHDL 3 09/15/2017 0815   VLDL 21.0 09/15/2017 0815   LDLCALC 77 09/15/2017 0815    Physical Exam:    VS:  BP 130/62 (BP Location: Left Arm, Patient Position: Sitting, Cuff Size: Normal)   Pulse 89   Temp (!) 97 F (36.1 C)   Ht 5\' 2"  (1.575 m)   Wt 121 lb (54.9 kg)   BMI 22.13 kg/m     Wt Readings from Last 3 Encounters:  09/30/19 121 lb (54.9 kg)  08/17/19 121 lb 9.6 oz (55.2 kg)  07/12/19 121 lb 1.6 oz (54.9 kg)    GEN: Well nourished, well developed in no acute distress. On chronic O2 by nasal cannula HEENT: Normal, moist mucous membranes NECK: No JVD CARDIAC: regular rhythm, normal S1 and S2, no rubs or gallops. 1/6 HSM VASCULAR: Radial and DP pulses 2+ bilaterally. No carotid bruits RESPIRATORY:  Clear to auscultation without rales,  wheezing or rhonchi  ABDOMEN: Soft, non-tender, non-distended MUSCULOSKELETAL:  Ambulates independently SKIN: Warm and dry, no edema NEUROLOGIC:  Alert and oriented x 3. No focal neuro deficits noted. PSYCHIATRIC:  Normal affect   ASSESSMENT:    1. Encounter to discuss test results   2. Lightheadedness   3. Cardiac risk counseling   4. Counseling on health promotion and disease prevention   5. Exercise counseling    PLAN:    Dizziness/lightheadedness: reports mild improvement with stopping losartan. Diastolic pressure number improved from 40s/50s to 6os -has seen neurology and ENT. Not thought to be vertigo -has resulted in injury or falls -echo without clear etiology -given her symptoms and history, I would aim for a higher BP target for her as opposed to <130/80. I think if she feels better in the 140s-150s/60s, that is reasonable for her. Given this, will hold all antihypertensives at this time -encouraged to hydrate -counseled  extensively on exercise/activity goals. I would recommend something like a recumbent exercise bike that would give stability of posture with excellent cardiovascular exercise potential. Even if she begins at 2 or 3 minutes at a time, gradually increasing her stamina will help her. I would avoid standing exercises like elliptical or treadmill given her symptoms and history of injuries with falls.  Cardiac risk counseling and prevention recommendations: -recommend heart healthy/Mediterranean diet, with whole grains, fruits, vegetable, fish, lean meats, nuts, and olive oil. Limit salt. -recommend moderate walking, 3-5 times/week for 30-50 minutes each session. Aim for at least 150 minutes.week. Goal should be pace of 3 miles/hours, or walking 1.5 miles in 30 minutes -recommend avoidance of tobacco products. Avoid excess alcohol. -reported history of CVA, on clopidogrel and rosuvastatin for secondary prevention  Plan for follow up: 12 mos or sooner PRN  TIME SPENT WITH PATIENT: 28 minutes of direct patient care. More than 50% of that time was spent on coordination of care and counseling regarding review of test results, counseling on management.  Buford Dresser, MD, PhD [place]  [facility]   Medication Adjustments/Labs and Tests Ordered: Current medicines are reviewed at length with the patient today.  Concerns regarding medicines are outlined above.  No orders of the defined types were placed in this encounter.  No orders of the defined types were placed in this encounter.   Patient Instructions  Medication Instructions:  Your Physician recommend you continue on your current medication as directed.    *If you need a refill on your cardiac medications before your next appointment, please call your pharmacy*  Lab Work: None  Testing/Procedures: None  Follow-Up: At [facility], you and your health needs are our priority.  As part of our continuing mission to provide  you with exceptional heart care, we have created designated Provider Care Teams.  These Care Teams include your primary Cardiologist (physician) and Advanced Practice Providers (APPs -  Physician Assistants and Nurse Practitioners) who all work together to provide you with the care you need, when you need it.  Your next appointment:   12 months  The format for your next appointment:   In Person  Provider:   Buford Dresser, MD      Signed, Buford Dresser, MD PhD 09/30/2019  [place]

## 2019-09-30 NOTE — Patient Instructions (Signed)
Medication Instructions:  Your Physician recommend you continue on your current medication as directed.    *If you need a refill on your cardiac medications before your next appointment, please call your pharmacy*  Lab Work: None  Testing/Procedures: None  Follow-Up: At [facility], you and your health needs are our priority.  As part of our continuing mission to provide you with exceptional heart care, we have created designated Provider Care Teams.  These Care Teams include your primary Cardiologist (physician) and Advanced Practice Providers (APPs -  Physician Assistants and Nurse Practitioners) who all work together to provide you with the care you need, when you need it.  Your next appointment:   12 months  The format for your next appointment:   In Person  Provider:   Buford Dresser, MD

## 2019-10-05 ENCOUNTER — Ambulatory Visit (INDEPENDENT_AMBULATORY_CARE_PROVIDER_SITE_OTHER): Payer: Medicare Other | Admitting: Psychiatry

## 2019-10-05 ENCOUNTER — Other Ambulatory Visit: Payer: Self-pay

## 2019-10-05 DIAGNOSIS — F3341 Major depressive disorder, recurrent, in partial remission: Secondary | ICD-10-CM

## 2019-10-05 DIAGNOSIS — F329 Major depressive disorder, single episode, unspecified: Secondary | ICD-10-CM

## 2019-10-05 DIAGNOSIS — F419 Anxiety disorder, unspecified: Secondary | ICD-10-CM | POA: Diagnosis not present

## 2019-10-05 MED ORDER — BUSPIRONE HCL 15 MG PO TABS: ORAL_TABLET | ORAL | 2 refills | Status: DC

## 2019-10-05 MED ORDER — BUPROPION HCL ER (XL) 150 MG PO TB24: ORAL_TABLET | ORAL | 3 refills | Status: DC

## 2019-10-05 MED ORDER — HYDROXYZINE PAMOATE 25 MG PO CAPS: ORAL_CAPSULE | ORAL | 2 refills | Status: DC

## 2019-10-05 NOTE — Progress Notes (Signed)
Psychiatric Initial Adult Assessment   Patient Identification: Amanda Porter MRN:  [id number] Date of Evaluation:  10/05/2019 Referral Source: Devonne Doughty primary care Chief Complaint:   Visit Diagnosis: Adjustment disorder with anxiety  History of Present Illness: Anxiety  Today we had a long discussion with the patient and her daughter [name].  Her daughter says the patient is depressed and eventually the patient agrees.  She feels depressed every day about half the time.  She also feels particularly anxious in the morning and late in the afternoon.  She clearly is distressed.  The Vistaril helps somewhat.  The patient says that when she was on Zoloft in the past she hallucinated.  She said Lexapro did work either.  The patient does not seem to be enjoying things as much.  At this time it is reasonable since she has been on these antidepressant before to try another antidepressant that of Wellbutrin.  The patient has never had a seizure.  Patient is not psychotic at all.  Generally she is eating okay and seems to have good energy.  Patient really needs to be seen face-to-face.  She will return to see me in 2 months and hopefully it will be face-to-face.  (Hypo) Manic Symptoms:   Anxiety Symptoms:  Excessive Worry, Psychotic Symptoms:   PTSD Symptoms:   Past Psychiatric History: Multiple antidepressants, SSRIs Remeron  Previous Psychotropic Medications:   Substance Abuse History in the last 12 months:    Consequences of Substance Abuse:   Past Medical History:  Past Medical History:  Diagnosis Date  . Anxiety   . Anxiety and depression   . COPD (chronic obstructive pulmonary disease) ([place])   . CVA (cerebral vascular accident) ([place])   . Dementia ([place])   . Depression   . Diabetes mellitus without complication ([place])   . Dysphasia   . History of lung cancer   . Hyperlipidemia   . Memory disturbance 11/20/2017  . Normal pressure hydrocephalus ([place]) 2018  . Retinal detachment     Right  . Right carotid bruit 11/20/2017  . Thrombosis    Arterial to lower extremity?    Past Surgical History:  Procedure Laterality Date  . LUNG REMOVAL, PARTIAL     left upper lobe  . VENTRICULOPERITONEAL SHUNT  12/09/2016    Family Psychiatric History:   Family History:  Family History  Problem Relation Age of Onset  . Pneumonia Mother   . Cancer Father   . Diabetes Sister   . Diabetes Sister   . Breast cancer Neg Hx     Social History:   Social History   Socioeconomic History  . Marital status: Divorced    Spouse name: Not on file  . Number of children: 1  . Years of education: Not on file  . Highest education level: Not on file  Occupational History  . Occupation: Retired  Scientific laboratory technician  . Financial resource strain: Not on file  . Food insecurity    Worry: Not on file    Inability: Not on file  . Transportation needs    Medical: Not on file    Non-medical: Not on file  Tobacco Use  . Smoking status: Former Smoker    Packs/day: 0.50    Years: 50.00    Pack years: 25.00    Quit date: 08/01/2017    Years since quitting: 2.1  . Smokeless tobacco: Never Used  Substance and Sexual Activity  . Alcohol use: No  . Drug use: No  . Sexual  activity: Not Currently  Lifestyle  . Physical activity    Days per week: Not on file    Minutes per session: Not on file  . Stress: Not on file  Relationships  . Social Herbalist on phone: Not on file    Gets together: Not on file    Attends religious service: Not on file    Active member of club or organization: Not on file    Attends meetings of clubs or organizations: Not on file    Relationship status: Not on file  Other Topics Concern  . Not on file  Social History Narrative   Moved from [place].   Lives with daughter.   Right handed    Additional Social History:   Allergies:   Allergies  Allergen Reactions  . Lexapro [Escitalopram Oxalate] Other (See Comments)    Dizziness, nausea, fatigue   . Neosporin  [Neomycin-Polymyxin-Gramicidin] Rash    (Neosporin)    Metabolic Disorder Labs: Lab Results  Component Value Date   HGBA1C 6.9 (H) 12/14/2018   No results found for: PROLACTIN Lab Results  Component Value Date   CHOL 157 09/15/2017   TRIG 105.0 09/15/2017   HDL 58.80 09/15/2017   CHOLHDL 3 09/15/2017   VLDL 21.0 09/15/2017   LDLCALC 77 09/15/2017   No results found for: TSH  Therapeutic Level Labs: No results found for: LITHIUM No results found for: CBMZ No results found for: VALPROATE  Current Medications: Current Outpatient Medications  Medication Sig Dispense Refill  . albuterol (PROVENTIL HFA;VENTOLIN HFA) 108 (90 Base) MCG/ACT inhaler INHALE 2 PUFFS BY MOUTH EVERY 4 HOURS AS NEEDED DX: J44.9 8.5 Inhaler 2  . albuterol (PROVENTIL) (2.5 MG/3ML) 0.083% nebulizer solution Take 3 mLs by nebulization every 6 (six) hours as needed. 150 mL 12  . alendronate (FOSAMAX) 70 MG tablet Take 1 tablet by mouth once weekly on an empty stomach with water only. Do not lay flat for one hour. 12 tablet 3  . buPROPion (WELLBUTRIN XL) 150 MG 24 hr tablet 1  qam  For 1 week then 2  qam 60 tablet 3  . busPIRone (BUSPAR) 15 MG tablet 2  qam   2   qpm 120 tablet 2  . clopidogrel (PLAVIX) 75 MG tablet TAKE 1 TABLET BY MOUTH EVERY DAY 90 tablet 1  . Ferrous Sulfate (IRON PO) Take by mouth.    . gabapentin (NEURONTIN) 100 MG capsule TAKE 1 CAPSULE BY MOUTH THREE TIMES A DAY 270 capsule 1  . hydrOXYzine (VISTARIL) 25 MG capsule 2  qam   2  After 1;00 then 1  qprn for anxiety 150 capsule 2  . Multiple Vitamin (MULTIVITAMIN) capsule Take 1 capsule by mouth daily.    . OXYGEN 2 L continuous.     . rosuvastatin (CRESTOR) 5 MG tablet Take 1 tablet (5 mg total) by mouth every evening. For cholesterol 90 tablet 0  . tiotropium (SPIRIVA HANDIHALER) 18 MCG inhalation capsule Place 1 capsule (18 mcg total) into inhaler and inhale daily. 30 capsule 0   No current facility-administered  medications for this visit.     Musculoskeletal: Strength & Muscle Tone: decreased Gait & Station: normal Patient leans: N/A  Psychiatric Specialty Exam: ROS  There were no vitals taken for this visit.There is no height or weight on file to calculate BMI.  General Appearance: Casual  Eye Contact:  Good  Speech:  Clear and Coherent  Volume:  Normal  Mood:  Anxious  Affect:  Appropriate  Thought Process:  Coherent  Orientation:  NA  Thought Content:  Logical  Suicidal Thoughts:  No  Homicidal Thoughts:  No  Memory:  Negative  Judgement:  Good  Insight:  Fair  Psychomotor Activity:  Normal  Concentration:    Recall:  [place] of Knowledge:Fair  Language: Fair  Akathisia:  No  Handed:  Right  AIMS (if indicated):  not done  Assets:  Desire for Improvement  ADL's:  Intact  Cognition: Impaired,  Mild  Sleep:  Good   Screenings: GAD-7     Office Visit from 02/18/2018 in [place] at [facility] Visit from 11/26/2017 in [place] at [facility]  Total GAD-7 Score  15  15    Mini-Mental     Office Visit from 05/24/2019 in [facility] Office Visit from 07/30/2018 in [facility] Office Visit from 11/20/2017 in [facility]  Total Score (max 30 points )  23  25  27     PHQ2-9     Nutrition from 03/26/2018 in [facility] from 03/02/2018 in [facility]  PHQ-2 Total Score  6  6  PHQ-9 Total Score  19  18      Assessment and Plan: \ 11/4/20204:07 PM   At this time the patient is #1 problem seems to be major depression.  We will begin her on Wellbutrin 150 mg in the morning for a week and then double it.  Today we will go ahead and also increase her Vistaril to taking 2 in the morning 2 in the afternoon and 1 as needed.  Should continue taking the BuSpar and we will clarify the dose at 15 mg 2 in the morning and 2 at night.  The patient is  interested in psychotherapy.  It is hard to imagine that this patient would benefit from significant psychotherapy but probably from supportive psychotherapy.  Efforts to get her previous therapist have been unfortunate.  They do not seem to be able to reach anyone.  Today we will go ahead and let them know that we will try to have somebody call him to see if they can get a supportive therapist here.  The patient is not suicidal.  She denies chest pain or shortness of breath or any neurological symptoms.

## 2019-10-27 ENCOUNTER — Other Ambulatory Visit (HOSPITAL_COMMUNITY): Payer: Self-pay | Admitting: Psychiatry

## 2019-10-27 DIAGNOSIS — F419 Anxiety disorder, unspecified: Secondary | ICD-10-CM

## 2019-10-27 DIAGNOSIS — F329 Major depressive disorder, single episode, unspecified: Secondary | ICD-10-CM

## 2019-11-10 ENCOUNTER — Telehealth: Payer: Self-pay | Admitting: Primary Care

## 2019-11-10 NOTE — Telephone Encounter (Signed)
Completed and placed on [place] desk. 

## 2019-11-10 NOTE — Telephone Encounter (Signed)
fmla source paperwork in [facility] in box For review and signature

## 2019-11-15 NOTE — Telephone Encounter (Signed)
Paperwork faxed °

## 2019-11-16 ENCOUNTER — Other Ambulatory Visit: Payer: Self-pay | Admitting: Primary Care

## 2019-11-16 DIAGNOSIS — J449 Chronic obstructive pulmonary disease, unspecified: Secondary | ICD-10-CM

## 2019-11-16 NOTE — Telephone Encounter (Signed)
Per PCP to forward request to pulmonary

## 2019-11-16 NOTE — Telephone Encounter (Signed)
[facility]pir[facility] refill e-sent

## 2019-11-18 NOTE — Telephone Encounter (Signed)
Emily/Chan, can you help the patient's daughter with this?

## 2019-11-18 NOTE — Telephone Encounter (Signed)
Lvm asking patient to call office to discuss this. We were unable to find the records from previous PCP. Also, we will need a signed ROI in order for her to get a copy of the screenings.

## 2019-11-18 NOTE — Telephone Encounter (Signed)
[place], Do you know how we would go about finding this information?

## 2019-11-21 NOTE — Telephone Encounter (Signed)
Patient's Daughter returned call Advised of messages below and she stated she understood.

## 2019-11-23 NOTE — Telephone Encounter (Signed)
Left message asking [name] (daughter) to call office Please let her know her paperwork was faxed on 12/15  Copy for pt Copy for scan

## 2019-12-06 ENCOUNTER — Telehealth: Payer: Self-pay | Admitting: *Deleted

## 2019-12-06 ENCOUNTER — Other Ambulatory Visit: Payer: Self-pay | Admitting: Primary Care

## 2019-12-06 ENCOUNTER — Ambulatory Visit: Payer: Medicare Other | Admitting: Neurology

## 2019-12-06 DIAGNOSIS — M8580 Other specified disorders of bone density and structure, unspecified site: Secondary | ICD-10-CM

## 2019-12-06 NOTE — Telephone Encounter (Signed)
Electronic refill request. Alendronate Last office visit:   06/02/2019 Last Filled:    12 tablet 3 10/12/2018   Please advise.

## 2019-12-06 NOTE — Telephone Encounter (Signed)
Approved and sent to pharmacy. Repeat bone density scan due in Fall 2021.

## 2019-12-06 NOTE — Telephone Encounter (Signed)
Patient's daughter called stating that she has been on her mom's mychart and notices that there was a refill today and back in December. [name] stated that on mychart it shows an After visit summary attached to the refill. [name] wanted to make sure that patient's insurance is not being billed for refills. Riccardo Dubin that I am not aware of patient's being charged for refills. Spoke to [place] up front and was advised that she can call and talk with someone in [place] Department at [phone] and discuss her concerns with them. [name] stated that she is going to call her [employer] and make sure she is not being charged for refills. [name] stated that she will call back if she has any further questions.

## 2019-12-07 ENCOUNTER — Telehealth (HOSPITAL_COMMUNITY): Payer: Self-pay | Admitting: Psychiatry

## 2019-12-07 ENCOUNTER — Ambulatory Visit (INDEPENDENT_AMBULATORY_CARE_PROVIDER_SITE_OTHER): Payer: Medicare PPO | Admitting: Psychiatry

## 2019-12-07 ENCOUNTER — Other Ambulatory Visit: Payer: Self-pay

## 2019-12-07 DIAGNOSIS — F419 Anxiety disorder, unspecified: Secondary | ICD-10-CM

## 2019-12-07 DIAGNOSIS — F329 Major depressive disorder, single episode, unspecified: Secondary | ICD-10-CM

## 2019-12-07 DIAGNOSIS — F320 Major depressive disorder, single episode, mild: Secondary | ICD-10-CM

## 2019-12-07 MED ORDER — BUSPIRONE HCL 15 MG PO TABS: ORAL_TABLET | ORAL | 1 refills | Status: DC

## 2019-12-07 MED ORDER — BUPROPION HCL ER (XL) 150 MG PO TB24: ORAL_TABLET | ORAL | 2 refills | Status: DC

## 2019-12-07 MED ORDER — HYDROXYZINE PAMOATE 25 MG PO CAPS: ORAL_CAPSULE | ORAL | 1 refills | Status: DC

## 2019-12-07 NOTE — Telephone Encounter (Signed)
12/07/19 10:52am Called and s/w patient's daughter informed that current insurance is inactive - the daughter stated that her mother was switched to [facility] but had not received the card.  Informed the daughter to email or call with new insurance information ASAP until then the visits will be Self-pay.  I gave the daughter my email address to send a copy of card once receive./sh

## 2019-12-07 NOTE — Progress Notes (Signed)
Psychiatric Initial Adult Assessment   Patient Identification: Amanda Porter MRN:  [id number] Date of Evaluation:  12/07/2019 Referral Source: Amanda Porter primary care Chief Complaint:   Visit Diagnosis: Adjustment disorder with anxiety  History of Present Illness: Anxiety   Time the patient does seem to be better. Her mood is improved. Today we talked her together and her daughter Amanda Porter. Her daughter agrees that she is somewhat better. She also recently got an exercise bike which is help. Patient is interested in some form of psychotherapy. And her son have been supportive psychotherapy to increase her activity. The patient generally is sleeping and eating well. She's got reasonably good amount of energy. Her anxiety is well controlled when she takes BuSpar 2 b.i.d. and some Vistaril. Continue taking Wellbutrin as prescribed. She is not psychotic. She does not drink or use any drugs. She's got to functioning fairly well he cystis a devoted daughter Amanda Porter.  (Hypo) Manic Symptoms:   Anxiety Symptoms:  Excessive Worry, Psychotic Symptoms:   PTSD Symptoms:   Past Psychiatric History: Multiple antidepressants, SSRIs Remeron  Previous Psychotropic Medications:   Substance Abuse History in the last 12 months:    Consequences of Substance Abuse:   Past Medical History:  Past Medical History:  Diagnosis Date  . Anxiety   . Anxiety and depression   . COPD (chronic obstructive pulmonary disease) ([place])   . CVA (cerebral vascular accident) ([place])   . Dementia ([place])   . Depression   . Diabetes mellitus without complication ([place])   . Dysphasia   . History of lung cancer   . Hyperlipidemia   . Memory disturbance 11/20/2017  . Normal pressure hydrocephalus ([place]) 2018  . Retinal detachment    Right  . Right carotid bruit 11/20/2017  . Thrombosis    Arterial to lower extremity?    Past Surgical History:  Procedure Laterality Date  . LUNG REMOVAL, PARTIAL     left upper lobe  .  VENTRICULOPERITONEAL SHUNT  12/09/2016    Family Psychiatric History:   Family History:  Family History  Problem Relation Age of Onset  . Pneumonia Mother   . Cancer Father   . Diabetes Sister   . Diabetes Sister   . Breast cancer Neg Hx     Social History:   Social History   Socioeconomic History  . Marital status: Divorced    Spouse name: Not on file  . Number of children: 1  . Years of education: Not on file  . Highest education level: Not on file  Occupational History  . Occupation: Retired  Tobacco Use  . Smoking status: Former Smoker    Packs/day: 0.50    Years: 50.00    Pack years: 25.00    Quit date: 08/01/2017    Years since quitting: 2.3  . Smokeless tobacco: Never Used  Substance and Sexual Activity  . Alcohol use: No  . Drug use: No  . Sexual activity: Not Currently  Other Topics Concern  . Not on file  Social History Narrative   Moved from [place].   Lives with daughter.   Right handed   Social Determinants of Health   Financial Resource Strain:   . Difficulty of Paying Living Expenses: Not on file  Food Insecurity:   . Worried About Charity fundraiser in the Last Year: Not on file  . Ran Out of Food in the Last Year: Not on file  Transportation Needs:   . Lack of Transportation (Medical): Not on file  .  Lack of Transportation (Non-Medical): Not on file  Physical Activity:   . Days of Exercise per Week: Not on file  . Minutes of Exercise per Session: Not on file  Stress:   . Feeling of Stress : Not on file  Social Connections:   . Frequency of Communication with Friends and Family: Not on file  . Frequency of Social Gatherings with Friends and Family: Not on file  . Attends Religious Services: Not on file  . Active Member of Clubs or Organizations: Not on file  . Attends Archivist Meetings: Not on file  . Marital Status: Not on file    Additional Social History:   Allergies:   Allergies  Allergen Reactions  . Lexapro  [Escitalopram Oxalate] Other (See Comments)    Dizziness, nausea, fatigue  . Neosporin  [Neomycin-Polymyxin-Gramicidin] Rash    (Neosporin)    Metabolic Disorder Labs: Lab Results  Component Value Date   HGBA1C 6.9 (H) 12/14/2018   No results found for: PROLACTIN Lab Results  Component Value Date   CHOL 157 09/15/2017   TRIG 105.0 09/15/2017   HDL 58.80 09/15/2017   CHOLHDL 3 09/15/2017   VLDL 21.0 09/15/2017   LDLCALC 77 09/15/2017   No results found for: TSH  Therapeutic Level Labs: No results found for: LITHIUM No results found for: CBMZ No results found for: VALPROATE  Current Medications: Current Outpatient Medications  Medication Sig Dispense Refill  . albuterol (PROVENTIL HFA;VENTOLIN HFA) 108 (90 Base) MCG/ACT inhaler INHALE 2 PUFFS BY MOUTH EVERY 4 HOURS AS NEEDED DX: J44.9 8.5 Inhaler 2  . albuterol (PROVENTIL) (2.5 MG/3ML) 0.083% nebulizer solution Take 3 mLs by nebulization every 6 (six) hours as needed. 150 mL 12  . alendronate (FOSAMAX) 70 MG tablet TAKE 1 TABLET BY MOUTH ONCE WEEKLY ON AN EMPTY STOMACH WITH WATER ONLY. DO NOT LAY FLAT FOR ONE HOUR 12 tablet 3  . buPROPion (WELLBUTRIN XL) 150 MG 24 hr tablet 2  qam 180 tablet 2  . busPIRone (BUSPAR) 15 MG tablet TAKE 2 TABLETS BY MOUTH IN THE MORNING AND TAKE 2 TABLETS BY MOUTH IN THE EVENING 360 tablet 1  . clopidogrel (PLAVIX) 75 MG tablet TAKE 1 TABLET BY MOUTH EVERY DAY 90 tablet 1  . Ferrous Sulfate (IRON PO) Take by mouth.    . gabapentin (NEURONTIN) 100 MG capsule TAKE 1 CAPSULE BY MOUTH THREE TIMES A DAY 270 capsule 1  . hydrOXYzine (VISTARIL) 25 MG capsule TAKE 2 TABLETS BY MOUTH IN THE MORNING, 2 TABLETS AFTER 1PM, THEN 1 TABLET AS NEEDED FOR ANXIETY 450 capsule 1  . Multiple Vitamin (MULTIVITAMIN) capsule Take 1 capsule by mouth daily.    . OXYGEN 2 L continuous.     . rosuvastatin (CRESTOR) 5 MG tablet Take 1 tablet (5 mg total) by mouth every evening. For cholesterol 90 tablet 0  . SPIRIVA  HANDIHALER 18 MCG inhalation capsule INHALE 1 CAPSULE VIA HANDIHALER ONCE DAILY AT THE SAME TIME EVERY DAY 30 capsule 12   No current facility-administered medications for this visit.    Musculoskeletal: Strength & Muscle Tone: decreased Gait & Station: normal Patient leans: N/A  Psychiatric Specialty Exam: ROS  There were no vitals taken for this visit.There is no height or weight on file to calculate BMI.  General Appearance: Casual  Eye Contact:  Good  Speech:  Clear and Coherent  Volume:  Normal  Mood:  Anxious  Affect:  Appropriate  Thought Process:  Coherent  Orientation:  NA  Thought Content:  Logical  Suicidal Thoughts:  No  Homicidal Thoughts:  No  Memory:  Negative  Judgement:  Good  Insight:  Fair  Psychomotor Activity:  Normal  Concentration:    Recall:  [place] of Knowledge:Fair  Language: Fair  Akathisia:  No  Handed:  Right  AIMS (if indicated):  not done  Assets:  Desire for Improvement  ADL's:  Intact  Cognition: Impaired,  Mild  Sleep:  Good   Screenings: GAD-7     Office Visit from 02/18/2018 in [place] at [facility] Visit from 11/26/2017 in [place] at [facility]  Total GAD-7 Score  15  15    Mini-Mental     Office Visit from 05/24/2019 in [facility] Office Visit from 07/30/2018 in [facility] Office Visit from 11/20/2017 in [facility]  Total Score (max 30 points )  23  25  27     PHQ2-9     Nutrition from 03/26/2018 in [facility] from 03/02/2018 in [facility]  PHQ-2 Total Score  6  6  PHQ-9 Total Score  19  18      Assessment and Plan: \ 1/6/20214:32 PM   This patient is #1 problem is that of major depression. She's been on multiple antidepressants in the past with side effects. At this time she is doing better taking Wellbutrin 300 mg each morning. Her anxiety is better controlled with  Vistaril and BuSpar. Earlier some issues in the past with being on antipsychotic medications and a question of whether not she is tardive dyskinesia. Proportionally she's not been able to be observed in the office. Return appointment to see me in 2-1/2 months and hopefully at that time she'll be seen in person. At that time we'll do an aims scale. Patient is not suicidal. I believe she is stable but somewhat improved.

## 2019-12-08 ENCOUNTER — Other Ambulatory Visit: Payer: Self-pay | Admitting: Primary Care

## 2019-12-08 DIAGNOSIS — E785 Hyperlipidemia, unspecified: Secondary | ICD-10-CM

## 2019-12-09 NOTE — Telephone Encounter (Signed)
Please review note and sign and close encounter if no action needed.

## 2019-12-26 ENCOUNTER — Other Ambulatory Visit: Payer: Self-pay | Admitting: Internal Medicine

## 2019-12-29 ENCOUNTER — Ambulatory Visit: Payer: Medicare Other | Admitting: Internal Medicine

## 2020-01-05 ENCOUNTER — Other Ambulatory Visit: Payer: Self-pay | Admitting: Primary Care

## 2020-01-05 DIAGNOSIS — E785 Hyperlipidemia, unspecified: Secondary | ICD-10-CM

## 2020-01-09 ENCOUNTER — Telehealth: Payer: Self-pay | Admitting: Neurology

## 2020-01-09 NOTE — Telephone Encounter (Signed)
If daughter calls back please schedule with Dr. Jannifer Franklin next available.I will call her back before 500pm to discuss her symptoms. If her mom is getting worse I recommend the emergency room to be evaluated.  Vm was left for daughter [name].

## 2020-01-09 NOTE — Telephone Encounter (Addendum)
I called pts daughter [name] about her moms concerns. She is not really concern about the dizzy spells because her mom has had that chronically but the tongue thrusting that's repetitive throughout the day. She has call the psychiatrist and he does not think the medications he prescribed is causing that. She is wearing dentures and they have tried to adjust. The daughter thinks its tardive dyskinsia. Her mom is eating and taking pills daily. I schedule her for February 01, 2020 at 0730am. I stated message will be sent to Dr. Hollace Hayward to review..She verbalized understanding

## 2020-01-09 NOTE — Telephone Encounter (Signed)
Patients daughter [name] called in and stated that she needs to schedule an appt with Dr Jannifer Franklin for patient for new and worsening symptoms. Patient is having increased dizziness and repetitive movent of her tongue. Please advise.  CB# [phone]

## 2020-01-09 NOTE — Telephone Encounter (Signed)
Daughter returned call , next available appt is in May , [name] is requesting a call back

## 2020-01-10 ENCOUNTER — Other Ambulatory Visit: Payer: Self-pay | Admitting: Primary Care

## 2020-01-10 DIAGNOSIS — Z1159 Encounter for screening for other viral diseases: Secondary | ICD-10-CM

## 2020-01-10 DIAGNOSIS — D649 Anemia, unspecified: Secondary | ICD-10-CM

## 2020-01-10 DIAGNOSIS — E119 Type 2 diabetes mellitus without complications: Secondary | ICD-10-CM

## 2020-01-10 DIAGNOSIS — E785 Hyperlipidemia, unspecified: Secondary | ICD-10-CM

## 2020-01-10 DIAGNOSIS — D509 Iron deficiency anemia, unspecified: Secondary | ICD-10-CM

## 2020-01-10 DIAGNOSIS — I10 Essential (primary) hypertension: Secondary | ICD-10-CM

## 2020-01-11 NOTE — Telephone Encounter (Signed)
I do not see that the patient is on medications that would cause tardive dyskinesia, the patient has an appointment on 01 February 2020. I will see her then.

## 2020-01-17 ENCOUNTER — Other Ambulatory Visit: Payer: Self-pay

## 2020-01-17 ENCOUNTER — Other Ambulatory Visit (INDEPENDENT_AMBULATORY_CARE_PROVIDER_SITE_OTHER): Payer: Medicare PPO

## 2020-01-17 DIAGNOSIS — Z1159 Encounter for screening for other viral diseases: Secondary | ICD-10-CM

## 2020-01-17 DIAGNOSIS — I10 Essential (primary) hypertension: Secondary | ICD-10-CM | POA: Diagnosis not present

## 2020-01-17 DIAGNOSIS — E785 Hyperlipidemia, unspecified: Secondary | ICD-10-CM

## 2020-01-17 DIAGNOSIS — D509 Iron deficiency anemia, unspecified: Secondary | ICD-10-CM

## 2020-01-17 DIAGNOSIS — E119 Type 2 diabetes mellitus without complications: Secondary | ICD-10-CM

## 2020-01-17 LAB — IBC + FERRITIN
Ferritin: 102.6 ng/mL (ref 10.0–291.0)
Iron: 54 ug/dL (ref 42–145)
Saturation Ratios: 15.7 % — ABNORMAL LOW (ref 20.0–50.0)
Transferrin: 246 mg/dL (ref 212.0–360.0)

## 2020-01-17 LAB — COMPREHENSIVE METABOLIC PANEL
ALT: 24 U/L (ref 0–35)
AST: 23 U/L (ref 0–37)
Albumin: 4.2 g/dL (ref 3.5–5.2)
Alkaline Phosphatase: 81 U/L (ref 39–117)
BUN: 13 mg/dL (ref 6–23)
CO2: 31 mEq/L (ref 19–32)
Calcium: 9.6 mg/dL (ref 8.4–10.5)
Chloride: 99 mEq/L (ref 96–112)
Creatinine, Ser: 0.86 mg/dL (ref 0.40–1.20)
GFR: 77.86 mL/min (ref >60.00)
Glucose, Bld: 184 mg/dL — ABNORMAL HIGH (ref 70–99)
Potassium: 4.3 mEq/L (ref 3.5–5.1)
Sodium: 139 mEq/L (ref 135–145)
Total Bilirubin: 0.3 mg/dL (ref 0.2–1.2)
Total Protein: 7.2 g/dL (ref 6.0–8.3)

## 2020-01-17 LAB — LIPID PANEL
Cholesterol: 156 mg/dL (ref 0–200)
HDL: 55.8 mg/dL (ref >39.00)
LDL Cholesterol: 82 mg/dL (ref 0–99)
NonHDL: 100.6
Total CHOL/HDL Ratio: 3
Triglycerides: 94 mg/dL (ref 0.0–149.0)
VLDL: 18.8 mg/dL (ref 0.0–40.0)

## 2020-01-17 LAB — CBC
HCT: 35.6 % — ABNORMAL LOW (ref 36.0–46.0)
Hemoglobin: 11.7 g/dL — ABNORMAL LOW (ref 12.0–15.0)
MCHC: 32.9 g/dL (ref 30.0–36.0)
MCV: 91 fl (ref 78.0–100.0)
Platelets: 383 10*3/uL (ref 150.0–400.0)
RBC: 3.92 Mil/uL (ref 3.87–5.11)
RDW: 13.1 % (ref 11.5–15.5)
WBC: 8.9 10*3/uL (ref 4.0–10.5)

## 2020-01-17 LAB — HEMOGLOBIN A1C: Hgb A1c MFr Bld: 7.8 % — ABNORMAL HIGH (ref 4.6–6.5)

## 2020-01-17 NOTE — Addendum Note (Signed)
Addended by: Ellamae Sia on: 01/17/2020 02:54 PM   Modules accepted: Orders

## 2020-01-18 LAB — MICROALBUMIN / CREATININE URINE RATIO
Creatinine,U: 74.7 mg/dL
Microalb Creat Ratio: 2 mg/g (ref 0.0–30.0)
Microalb, Ur: 1.5 mg/dL (ref 0.0–1.9)

## 2020-01-18 LAB — HEPATITIS C ANTIBODY
Hepatitis C Ab: NONREACTIVE
SIGNAL TO CUT-OFF: 0.02 (ref <1.00)

## 2020-01-24 ENCOUNTER — Other Ambulatory Visit: Payer: Self-pay

## 2020-01-24 ENCOUNTER — Encounter: Payer: Self-pay | Admitting: Primary Care

## 2020-01-24 ENCOUNTER — Ambulatory Visit (INDEPENDENT_AMBULATORY_CARE_PROVIDER_SITE_OTHER): Payer: Medicare PPO | Admitting: Primary Care

## 2020-01-24 VITALS — BP 130/76 | HR 110 | Temp 97.4°F | Ht 62.0 in | Wt 117.2 lb

## 2020-01-24 DIAGNOSIS — I10 Essential (primary) hypertension: Secondary | ICD-10-CM

## 2020-01-24 DIAGNOSIS — Z0000 Encounter for general adult medical examination without abnormal findings: Secondary | ICD-10-CM

## 2020-01-24 DIAGNOSIS — J9611 Chronic respiratory failure with hypoxia: Secondary | ICD-10-CM

## 2020-01-24 DIAGNOSIS — Z8673 Personal history of transient ischemic attack (TIA), and cerebral infarction without residual deficits: Secondary | ICD-10-CM

## 2020-01-24 DIAGNOSIS — R42 Dizziness and giddiness: Secondary | ICD-10-CM

## 2020-01-24 DIAGNOSIS — E119 Type 2 diabetes mellitus without complications: Secondary | ICD-10-CM | POA: Diagnosis not present

## 2020-01-24 DIAGNOSIS — G912 (Idiopathic) normal pressure hydrocephalus: Secondary | ICD-10-CM

## 2020-01-24 DIAGNOSIS — J449 Chronic obstructive pulmonary disease, unspecified: Secondary | ICD-10-CM | POA: Diagnosis not present

## 2020-01-24 DIAGNOSIS — E785 Hyperlipidemia, unspecified: Secondary | ICD-10-CM | POA: Diagnosis not present

## 2020-01-24 DIAGNOSIS — F329 Major depressive disorder, single episode, unspecified: Secondary | ICD-10-CM

## 2020-01-24 DIAGNOSIS — D649 Anemia, unspecified: Secondary | ICD-10-CM

## 2020-01-24 DIAGNOSIS — F419 Anxiety disorder, unspecified: Secondary | ICD-10-CM

## 2020-01-24 DIAGNOSIS — F0390 Unspecified dementia without behavioral disturbance: Secondary | ICD-10-CM

## 2020-01-24 MED ORDER — ROSUVASTATIN CALCIUM 10 MG PO TABS: 10.0000 mg | ORAL_TABLET | Freq: Every evening | ORAL | 3 refills | Status: DC

## 2020-01-24 MED ORDER — GLIPIZIDE ER 2.5 MG PO TB24: 2.5000 mg | ORAL_TABLET | Freq: Every day | ORAL | 3 refills | Status: DC

## 2020-01-24 MED ORDER — ALBUTEROL SULFATE HFA 108 (90 BASE) MCG/ACT IN AERS: INHALATION_SPRAY | RESPIRATORY_TRACT | 0 refills | Status: DC

## 2020-01-24 MED ORDER — ONETOUCH ULTRA VI STRP: ORAL_STRIP | 5 refills | Status: DC

## 2020-01-24 MED ORDER — ONETOUCH DELICA LANCETS 33G MISC: 5 refills | Status: DC

## 2020-01-24 NOTE — Assessment & Plan Note (Signed)
A1C of 7.8 which is above goal given her history of CVA. Suspect this is secondary to her lack of physical activity and poor dietary choices.  Encouraged both physical activity and improved diet. She does have a recumbent bicycle so we discussed daily use.  Add back Glipizide but given her age and frailty we will start at 2.5 mg XL daily. She could not tolerate Metformin regular or enteric coated. Did well on Glipizide in the past.  Repeat A1C in 3 months, follow up in 6 months.

## 2020-01-24 NOTE — Assessment & Plan Note (Signed)
Immunizations UTD.  Mammogram and Bone density tests due in November 2021. Colonoscopy due this Summer. Encouraged to increase activity, work on a healthier diet. Exam today stable. Obvious physical deconditioning. Labs reviewed.

## 2020-01-24 NOTE — Patient Instructions (Addendum)
We've increased the dose of your rosuvastatin (Crestor) to 10 mg. This is to reduce your LDL (bad cholesterol) to <70.   Start glipizide XL 2.5 mg once daily with breakfast for diabetes.  It is important that you improve your diet. Please limit carbohydrates in the form of white bread, rice, pasta, sweets, fast food, fried food, sugary drinks, etc. Increase your consumption of fresh fruits and vegetables, whole grains, lean protein.  Ensure you are consuming 64 ounces of water daily.  You are due for your colonoscopy in July 2021. You are due for bone density scan and mammogram in November 2021.  Follow up with neurology and pulmonology.  You will be contacted regarding your referral to ENT for dizziness.  Please let us know if you have not been contacted within two weeks.   Schedule a lab appointment for 3 months for diabetes check. Schedule a follow up visit for 6 months for diabetes check.  It was a pleasure to see you today!   Preventive Care 14 Years and Older, Female Preventive care refers to lifestyle choices and visits with your health care provider that can promote health and wellness. This includes:  A yearly physical exam. This is also called an annual well check.  Regular dental and eye exams.  Immunizations.  Screening for certain conditions.  Healthy lifestyle choices, such as diet and exercise. What can I expect for my preventive care visit? Physical exam Your health care provider will check:  Height and weight. These may be used to calculate body mass index (BMI), which is a measurement that tells if you are at a healthy weight.  Heart rate and blood pressure.  Your skin for abnormal spots. Counseling Your health care provider may ask you questions about:  Alcohol, tobacco, and drug use.  Emotional well-being.  Home and relationship well-being.  Sexual activity.  Eating habits.  History of falls.  Memory and ability to understand  (cognition).  Work and work Statistician.  Pregnancy and menstrual history. What immunizations do I need?  Influenza (flu) vaccine  This is recommended every year. Tetanus, diphtheria, and pertussis (Tdap) vaccine  You may need a Td booster every 10 years. Varicella (chickenpox) vaccine  You may need this vaccine if you have not already been vaccinated. Zoster (shingles) vaccine  You may need this after age 37. Pneumococcal conjugate (PCV13) vaccine  One dose is recommended after age 23. Pneumococcal polysaccharide (PPSV23) vaccine  One dose is recommended after age 30. Measles, mumps, and rubella (MMR) vaccine  You may need at least one dose of MMR if you were born in 1957 or later. You may also need a second dose. Meningococcal conjugate (MenACWY) vaccine  You may need this if you have certain conditions. Hepatitis A vaccine  You may need this if you have certain conditions or if you travel or work in places where you may be exposed to hepatitis A. Hepatitis B vaccine  You may need this if you have certain conditions or if you travel or work in places where you may be exposed to hepatitis B. Haemophilus influenzae type b (Hib) vaccine  You may need this if you have certain conditions. You may receive vaccines as individual doses or as more than one vaccine together in one shot (combination vaccines). Talk with your health care provider about the risks and benefits of combination vaccines. What tests do I need? Blood tests  Lipid and cholesterol levels. These may be checked every 5 years, or more frequently  depending on your overall health.  Hepatitis C test.  Hepatitis B test. Screening  Lung cancer screening. You may have this screening every year starting at age 49 if you have a 30-pack-year history of smoking and currently smoke or have quit within the past 15 years.  Colorectal cancer screening. All adults should have this screening starting at age 48 and  continuing until age 67. Your health care provider may recommend screening at age 20 if you are at increased risk. You will have tests every 1-10 years, depending on your results and the type of screening test.  Diabetes screening. This is done by checking your blood sugar (glucose) after you have not eaten for a while (fasting). You may have this done every 1-3 years.  Mammogram. This may be done every 1-2 years. Talk with your health care provider about how often you should have regular mammograms.  BRCA-related cancer screening. This may be done if you have a family history of breast, ovarian, tubal, or peritoneal cancers. Other tests  Sexually transmitted disease (STD) testing.  Bone density scan. This is done to screen for osteoporosis. You may have this done starting at age 71. Follow these instructions at home: Eating and drinking  Eat a diet that includes fresh fruits and vegetables, whole grains, lean protein, and low-fat dairy products. Limit your intake of foods with high amounts of sugar, saturated fats, and salt.  Take vitamin and mineral supplements as recommended by your health care provider.  Do not drink alcohol if your health care provider tells you not to drink.  If you drink alcohol: ? Limit how much you have to 0-1 drink a day. ? Be aware of how much alcohol is in your drink. In the [place] one drink equals one 12 oz bottle of beer (355 mL), one 5 oz glass of wine (148 mL), or one 1 oz glass of hard liquor (44 mL). Lifestyle  Take daily care of your teeth and gums.  Stay active. Exercise for at least 30 minutes on 5 or more days each week.  Do not use any products that contain nicotine or tobacco, such as cigarettes, e-cigarettes, and chewing tobacco. If you need help quitting, ask your health care provider.  If you are sexually active, practice safe sex. Use a condom or other form of protection in order to prevent STIs (sexually transmitted infections).  Talk  with your health care provider about taking a low-dose aspirin or statin. What's next?  Go to your health care provider once a year for a well check visit.  Ask your health care provider how often you should have your eyes and teeth checked.  Stay up to date on all vaccines. This information is not intended to replace advice given to you by your health care provider. Make sure you discuss any questions you have with your health care provider. Document Revised: 11/11/2018 Document Reviewed: 11/11/2018 Elsevier Patient Education  2020 [employer].

## 2020-01-24 NOTE — Assessment & Plan Note (Signed)
Continued and daily. Following with ENT, patient would like a second opinion.  Has appointment with neurology soon.  Referral placed to ENT for second opinion.

## 2020-01-24 NOTE — Assessment & Plan Note (Signed)
No new acute symptoms. Appears deconditioned due to sedentary lifestyle. Referral placed to home health PT.

## 2020-01-24 NOTE — Assessment & Plan Note (Signed)
Stable in the office today, continue to monitor.  

## 2020-01-24 NOTE — Assessment & Plan Note (Signed)
Overall feels well managed per psychiatry. Continue current regimen.

## 2020-01-24 NOTE — Assessment & Plan Note (Signed)
Overall stable, no significant decline. Following with neurology.

## 2020-01-24 NOTE — Assessment & Plan Note (Signed)
Followed by nephrology.  VP shunt in place.

## 2020-01-24 NOTE — Progress Notes (Signed)
Subjective:    Patient ID: Amanda Porter, female    DOB: Jul 21, 1946, 74 y.o.   MRN: [id number]  HPI  This visit occurred during the SARS-CoV-2 public health emergency.  Safety protocols were in place, including screening questions prior to the visit, additional usage of staff PPE, and extensive cleaning of exam room while observing appropriate contact time as indicated for disinfecting solutions.   [name] is a 74 year old female who presents today for complete physical. Her daughter is with her today providing information for the HPI.   Overall decline in respiratory status, increased exertional dyspnea. Her daughter realized that she wasn't getting her dose of Spiriva for about one month. She has been compliant to her albuterol nebulized treatments and completes those three times daily. Her daughter also is concerned about her physical decline, shuffling gait, and little physical activity. The patient is sedentary for most of the day, either sits on the sofa or recliner, little activity. She has a recumbent bicycle for which she doesn't use. Her daughter suspects that her muscles have atrophied which has caused her poor gait. She is fearful that her mother will fall.   Immunizations: -Tetanus: Completed in 2020 -Influenza: Completed this season  -Shingles: Never had chicken pox -Pneumonia: Completed last in 2020.  Diet: She endorses a poor diet, more snacking, daily soda drinking.  Exercise: She is not exercising.   Eye exam: Due and will schedule.  Dental exam: No recent exam.  Mammogram: Completed in November 2019 Dexa: Completed in November 2019, managed on alendronate since 2019 with breaks in between. More off than on.  Colonoscopy: Completed in 2018, due in Summer of 2021. Hep C Screen: Negative in 2021  BP Readings from Last 3 Encounters:  01/24/20 130/76  09/30/19 130/62  08/17/19 (!) 146/62      Review of Systems  Constitutional: Negative for unexpected  weight change.  HENT: Negative for rhinorrhea.   Respiratory: Positive for shortness of breath.        Increased exertional dyspnea   Cardiovascular: Negative for chest pain.  Gastrointestinal: Negative for constipation and diarrhea.  Genitourinary: Negative for difficulty urinating.  Musculoskeletal: Negative for arthralgias.       Deconditioned, shuffling gait  Skin: Negative for rash.  Allergic/Immunologic: Negative for environmental allergies.  Neurological: Positive for dizziness. Negative for headaches.  Psychiatric/Behavioral:       Overall feels well managed per psychiatry        Past Medical History:  Diagnosis Date  . Anxiety   . Anxiety and depression   . COPD (chronic obstructive pulmonary disease) ([place])   . CVA (cerebral vascular accident) ([place])   . Dementia ([place])   . Depression   . Diabetes mellitus without complication ([place])   . Dysphasia   . History of lung cancer   . Hyperlipidemia   . Memory disturbance 11/20/2017  . Normal pressure hydrocephalus ([place]) 2018  . Retinal detachment    Right  . Right carotid bruit 11/20/2017  . Thrombosis    Arterial to lower extremity?     Social History   Socioeconomic History  . Marital status: Divorced    Spouse name: Not on file  . Number of children: 1  . Years of education: Not on file  . Highest education level: Not on file  Occupational History  . Occupation: Retired  Tobacco Use  . Smoking status: Former Smoker    Packs/day: 0.50    Years: 50.00    Pack years:  25.00    Quit date: 08/01/2017    Years since quitting: 2.4  . Smokeless tobacco: Never Used  Substance and Sexual Activity  . Alcohol use: No  . Drug use: No  . Sexual activity: Not Currently  Other Topics Concern  . Not on file  Social History Narrative   Moved from [place].   Lives with daughter.   Right handed   Social Determinants of Health   Financial Resource Strain:   . Difficulty of Paying Living Expenses: Not on file  Food  Insecurity:   . Worried About Charity fundraiser in the Last Year: Not on file  . Ran Out of Food in the Last Year: Not on file  Transportation Needs:   . Lack of Transportation (Medical): Not on file  . Lack of Transportation (Non-Medical): Not on file  Physical Activity:   . Days of Exercise per Week: Not on file  . Minutes of Exercise per Session: Not on file  Stress:   . Feeling of Stress : Not on file  Social Connections:   . Frequency of Communication with Friends and Family: Not on file  . Frequency of Social Gatherings with Friends and Family: Not on file  . Attends Religious Services: Not on file  . Active Member of Clubs or Organizations: Not on file  . Attends Archivist Meetings: Not on file  . Marital Status: Not on file  Intimate Partner Violence:   . Fear of Current or Ex-Partner: Not on file  . Emotionally Abused: Not on file  . Physically Abused: Not on file  . Sexually Abused: Not on file    Past Surgical History:  Procedure Laterality Date  . LUNG REMOVAL, PARTIAL     left upper lobe  . VENTRICULOPERITONEAL SHUNT  12/09/2016    Family History  Problem Relation Age of Onset  . Pneumonia Mother   . Cancer Father   . Diabetes Sister   . Diabetes Sister   . Breast cancer Neg Hx     Allergies  Allergen Reactions  . Lexapro [Escitalopram Oxalate] Other (See Comments)    Dizziness, nausea, fatigue  . Neosporin  [Neomycin-Polymyxin-Gramicidin] Rash    (Neosporin)    Current Outpatient Medications on File Prior to Visit  Medication Sig Dispense Refill  . albuterol (PROVENTIL) (2.5 MG/3ML) 0.083% nebulizer solution USE 1 VIAL IN NEBULIZER EVERY 6 HOURS - and as needed 75 mL 11  . alendronate (FOSAMAX) 70 MG tablet TAKE 1 TABLET BY MOUTH ONCE WEEKLY ON AN EMPTY STOMACH WITH WATER ONLY. DO NOT LAY FLAT FOR ONE HOUR 12 tablet 3  . buPROPion (WELLBUTRIN XL) 150 MG 24 hr tablet 2  qam 180 tablet 2  . busPIRone (BUSPAR) 15 MG tablet TAKE 2 TABLETS  BY MOUTH IN THE MORNING AND TAKE 2 TABLETS BY MOUTH IN THE EVENING 360 tablet 1  . clopidogrel (PLAVIX) 75 MG tablet TAKE 1 TABLET BY MOUTH EVERY DAY 90 tablet 1  . Ferrous Sulfate (IRON PO) Take by mouth.    . hydrOXYzine (VISTARIL) 25 MG capsule TAKE 2 TABLETS BY MOUTH IN THE MORNING, 2 TABLETS AFTER 1PM, THEN 1 TABLET AS NEEDED FOR ANXIETY 450 capsule 1  . Multiple Vitamin (MULTIVITAMIN) capsule Take 1 capsule by mouth daily.    . OXYGEN 2 L continuous.     Marland Kitchen SPIRIVA HANDIHALER 18 MCG inhalation capsule INHALE 1 CAPSULE VIA HANDIHALER ONCE DAILY AT THE SAME TIME EVERY DAY 30 capsule 12  No current facility-administered medications on file prior to visit.    BP 130/76   Pulse (!) 110   Temp (!) 97.4 F (36.3 C) (Temporal)   Ht 5\' 2"  (1.575 m)   Wt 117 lb 4 oz (53.2 kg)   SpO2 92%   BMI 21.45 kg/m    Objective:   Physical Exam  Constitutional: She is oriented to person, place, and time. She appears well-nourished.  HENT:  Right Ear: Tympanic membrane and ear canal normal.  Left Ear: Tympanic membrane and ear canal normal.  Mouth/Throat: Oropharynx is clear and moist.  Eyes: Pupils are equal, round, and reactive to light. EOM are normal.  Cardiovascular: Normal rate and regular rhythm.  Respiratory: Effort normal and breath sounds normal.  Supplemental oxygen in place  GI: Soft. Bowel sounds are normal. There is no abdominal tenderness.  Musculoskeletal:     Cervical back: Neck supple.     Comments: She does seem to shuffle her feet when walking in the clinic.  Neurological: She is alert and oriented to person, place, and time. No cranial nerve deficit.  Reflex Scores:      Patellar reflexes are 2+ on the right side and 2+ on the left side. Skin: Skin is warm and dry.  Psychiatric: She has a normal mood and affect.           Assessment & Plan:

## 2020-01-24 NOTE — Assessment & Plan Note (Signed)
Suspect increased exertional dyspnea to be secondary to deconditioning from sedentary lifestyle.   Exam today benign. Strongly encouraged daily physical activity to increase endurance.  Referral placed for home health physical therapy.

## 2020-01-24 NOTE — Assessment & Plan Note (Signed)
Recent CBC stable.  Continue to monitor.

## 2020-01-24 NOTE — Assessment & Plan Note (Signed)
LDL above goal of 70. Increase Crestor to 10 mg. Repeat lipids next visit.

## 2020-01-24 NOTE — Assessment & Plan Note (Signed)
Compliant to supplemental oxygen. Overall stable in the office today. She has obvious physical deconditioning.

## 2020-01-30 ENCOUNTER — Encounter: Payer: Self-pay | Admitting: Internal Medicine

## 2020-01-30 ENCOUNTER — Ambulatory Visit: Payer: Medicare PPO | Admitting: Internal Medicine

## 2020-01-30 ENCOUNTER — Other Ambulatory Visit: Payer: Self-pay

## 2020-01-30 VITALS — BP 116/60 | HR 94 | Temp 97.1°F | Ht 62.0 in | Wt 116.0 lb

## 2020-01-30 DIAGNOSIS — J449 Chronic obstructive pulmonary disease, unspecified: Secondary | ICD-10-CM | POA: Diagnosis not present

## 2020-01-30 DIAGNOSIS — R0600 Dyspnea, unspecified: Secondary | ICD-10-CM

## 2020-01-30 DIAGNOSIS — F17200 Nicotine dependence, unspecified, uncomplicated: Secondary | ICD-10-CM | POA: Diagnosis not present

## 2020-01-30 DIAGNOSIS — J9611 Chronic respiratory failure with hypoxia: Secondary | ICD-10-CM

## 2020-01-30 DIAGNOSIS — R0609 Other forms of dyspnea: Secondary | ICD-10-CM

## 2020-01-30 MED ORDER — ANORO ELLIPTA 62.5-25 MCG/INH IN AEPB: 1.0000 | INHALATION_SPRAY | Freq: Every day | RESPIRATORY_TRACT | 0 refills | Status: DC

## 2020-01-30 NOTE — Patient Instructions (Addendum)
Order- CXR   Dx Dypsnea on exertion  Order- Schedule Spirometry   Dx Dyspnea on exertion  Sample x 2  Anoro maintenance inhaler    Inhale  1 puff ,  Once daily everyday.   Do this instead of Spiriva.  You can still use the nebulizer machine or the albuterol rescue inhaler every 6 hours, if needed for shortness of breath.  Skip if not needed   When you run out of the samples, go back to Spiriva the way you were doing.   If you really liked the Anoro, let us know and we can send a prescription.  Please call if we can help

## 2020-01-30 NOTE — Progress Notes (Signed)
MRN# [id number] Amanda Porter [birth year] HPI- Amanda Porter - F former smoker followed for COPD, Chronic hypoxic respiratory failure, complicated by dementia/ TIAs/ Normal Pressure Hydrocephalus/ VP shunt 12/09/16,  LULobectomy for lung Ca 2000, Dysphagia solids/ liquids, Hx DVT/PE/ L femoral embolectomy 1998, DM2 **Desat walk 09/30/17; baseline sat was 95% and HR 83. After walking 360 feet sat was 86% and HR 120, added oxygen at 2L and sat increased to 92%   ---------------------------------------------------------------------------------   06/28/2019-  72 yoF former smoker followed for COPD, Chronic hypoxic respiratory failure, complicated by dementia/ TIAs/ Normal Pressure Hydrocephalus/ VP shunt 12/09/16,  LULobectomy for lung Ca 2000, Dysphagia solids/ liquids, Hx DVT/PE/ L femoral embolectomy 1998, DM2 O2 2L continuous and POC/ APS Spiriva handihaler, neb albuterol, albuterol hfa, Daughter here today. They do want to continue care through this office. HST 06/23/2019- AHI 3.9/ hr, desat on room air to 83% with mean sat 89%, body weight 114 lbs.   Recommended continue current sleep O2 2.5L. Followed by Neuro and Psychiatry.  -----breathing at baseline, pt on O2 2L POC & continuous, DME: [place] POC wasn't working on arrival- placed on our tank for visit. Daughter has already contacted APS for service. Occasionally noted to wake in AM with rapid pulse and anxious. Has been using O2 at 2L. Feels a "little woozy" - says BP meds being adjusted.  Little cough or wheeze. Spiriva works ok.   01/30/20- 73 yoF former smoker followed for COPD, Chronic hypoxic respiratory failure, complicated by dementia/ TIAs/ Normal Pressure Hydrocephalus/ VP shunt 12/09/16, Balance Disorder, LULobectomy for lung Ca 2000, Dysphagia solids/ liquids, Hx DVT/PE/ L femoral embolectomy 1998, DM2 O2 3L continuous and POC/ APS Spiriva handihaler, neb albuterol, albuterol hfa -----f/u COPD. O2 3l/min Body  weight today 116 lbs Had flu vax and 1 Covid Phizer                   Daughter here( EMT) Nebulizer and O2 are helpful. Has wheeze but no cough.  Sleeps head elevated. Breathing doesn't wake her.  Being evaluated for chronic dizziness/ risk of falls. CXR 06/28/19- IMPRESSION: 1. Stable surgical changes and probable radiation changes involving the left hilum related to a prior left upper lobe lobectomy. 2. No findings suspicious for recurrent tumor or metastatic pulmonary lesions.   ROS-see HPI   + = positive Constitutional:    weight loss, night sweats, fevers, chills, fatigue, lassitude. HEENT:    headaches, difficulty swallowing, tooth/dental problems, sore throat,       sneezing, itching, ear ache, nasal congestion, post nasal drip, snoring CV:    chest pain, orthopnea, PND, swelling in lower extremities, anasarca,                                  dizziness, palpitations Resp:   +shortness of breath with exertion or at rest.                productive cough,   non-productive cough, coughing up of blood.              change in color of mucus.  +wheezing.   Skin:    rash or lesions. GI:  No-   heartburn, +indigestion, abdominal pain, nausea, vomiting, diarrhea,                 change in bowel habits, loss of appetite GU: dysuria, change in color of urine, no urgency  or frequency.   flank pain. MS:  + joint pain, stiffness, decreased range of motion, back pain. Neuro-     + HPI   Dizzy/ balance problem Psych:  change in mood or affect.  +depression or +anxiety.   memory loss.  OBJ- Physical Exam General- Alert, Oriented, Affect-appropriate, Distress- none acute, O2 2L pulse Skin- rash-none, lesions- none, excoriation- none Lymphadenopathy- none Head- atraumatic            Eyes- Gross vision intact, PERRLA, conjunctivae and secretions clear            Ears- Hearing, canals-normal            Nose- Clear, no-Septal dev, mucus, polyps, erosion, perforation             Throat-  Mallampati II , mucosa clear , drainage- none, tonsils- atrophic Neck- flexible , trachea midline, no stridor , thyroid nl, carotid no bruit Chest - symmetrical excursion , unlabored           Heart/CV- RRR , no murmur , no gallop  , no rub, nl s1 s2                           - JVD- none , edema- none, stasis changes- none, varices- none           Lung- clear to P&A, wheeze- none, cough- none , dullness-none, rub- none           Chest wall-  Abd-  Br/ Gen/ Rectal- Not done, not indicated Extrem- cyanosis- none, clubbing, none, atrophy- none, strength- nl. + wheelchair Neuro- grossly intact to observation

## 2020-02-01 ENCOUNTER — Encounter: Payer: Self-pay | Admitting: Neurology

## 2020-02-01 ENCOUNTER — Ambulatory Visit: Payer: Medicare PPO | Admitting: Neurology

## 2020-02-01 ENCOUNTER — Other Ambulatory Visit: Payer: Self-pay

## 2020-02-01 VITALS — BP 124/66 | HR 106 | Temp 97.5°F | Ht 62.0 in | Wt 115.0 lb

## 2020-02-01 DIAGNOSIS — F0390 Unspecified dementia without behavioral disturbance: Secondary | ICD-10-CM | POA: Diagnosis not present

## 2020-02-01 DIAGNOSIS — G912 (Idiopathic) normal pressure hydrocephalus: Secondary | ICD-10-CM

## 2020-02-01 DIAGNOSIS — R269 Unspecified abnormalities of gait and mobility: Secondary | ICD-10-CM

## 2020-02-01 DIAGNOSIS — R42 Dizziness and giddiness: Secondary | ICD-10-CM | POA: Diagnosis not present

## 2020-02-01 DIAGNOSIS — H814 Vertigo of central origin: Secondary | ICD-10-CM | POA: Diagnosis not present

## 2020-02-01 DIAGNOSIS — G2401 Drug induced subacute dyskinesia: Secondary | ICD-10-CM | POA: Insufficient documentation

## 2020-02-01 HISTORY — DX: Drug induced subacute dyskinesia: G24.01

## 2020-02-01 NOTE — Patient Instructions (Signed)
Stop the vistaril. Use a walker for the dizziness.

## 2020-02-01 NOTE — Progress Notes (Signed)
Reason for visit: Dizziness, tardive dyskinesia, normal pressure hydrocephalus  Amanda Porter is an 74 y.o. female  History of present illness:  Amanda Porter is a 74 year old right-handed black female with a history of normal pressure hydrocephalus and a VP shunt placement.  The patient has a history of cerebrovascular disease, she has had a right thalamic stroke in the past, she has evidence of small vessel disease by CT of the brain.  The patient has had a longstanding history of dizziness.  The patient reports dizziness with standing, sometimes with stooping over.  She does not have any dizziness with sitting or lying down.  The patient in the past has had some cervical spondylosis problems and neck pain, this has improved over time.  The patient has been to an ENT physician and a cardiologist, the etiology of the dizziness has not been discerned.  The patient more recently has developed some tardive dyskinesia movements with the tongue and mouth and this started after initiation of hydroxyzine therapy, and worsened when this medication was increased.  The patient has not had any falls, she does not use a walker or cane for ambulation.  She reports no new numbness or weakness of the extremities.  She does have a history of some memory issues.  Past Medical History:  Diagnosis Date  . Anxiety   . Anxiety and depression   . COPD (chronic obstructive pulmonary disease) ([place])   . CVA (cerebral vascular accident) ([place])   . Dementia ([place])   . Depression   . Diabetes mellitus without complication ([place])   . Dysphasia   . History of lung cancer   . Hyperlipidemia   . Memory disturbance 11/20/2017  . Normal pressure hydrocephalus ([place] ) 2018  . Retinal detachment    Right  . Right carotid bruit 11/20/2017  . Thrombosis    Arterial to lower extremity?    Past Surgical History:  Procedure Laterality Date  . LUNG REMOVAL, PARTIAL     left upper lobe  . VENTRICULOPERITONEAL SHUNT   12/09/2016    Family History  Problem Relation Age of Onset  . Pneumonia Mother   . Cancer Father   . Diabetes Sister   . Diabetes Sister   . Breast cancer Neg Hx     Social history:  reports that she quit smoking about 2 years ago. She has a 25.00 pack-year smoking history. She has never used smokeless tobacco. She reports that she does not drink alcohol or use drugs.    Allergies  Allergen Reactions  . Lexapro [Escitalopram Oxalate] Other (See Comments)    Dizziness, nausea, fatigue  . Neosporin  [Neomycin-Polymyxin-Gramicidin] Rash    (Neosporin)    Medications:  Prior to Admission medications   Medication Sig Start Date End Date Taking? Authorizing Provider  albuterol (PROVENTIL) (2.5 MG/3ML) 0.083% nebulizer solution USE 1 VIAL IN NEBULIZER EVERY 6 HOURS - and as needed 12/26/19  Yes Young, Clinton D, MD  albuterol (VENTOLIN HFA) 108 (90 Base) MCG/ACT inhaler INHALE 2 PUFFS BY MOUTH EVERY 4 HOURS AS NEEDED DX: J44.9 01/24/20  Yes Pleas Koch, NP  alendronate (FOSAMAX) 70 MG tablet TAKE 1 TABLET BY MOUTH ONCE WEEKLY ON AN EMPTY STOMACH WITH WATER ONLY. DO NOT LAY FLAT FOR ONE HOUR 12/06/19  Yes Pleas Koch, NP  buPROPion (WELLBUTRIN XL) 150 MG 24 hr tablet 2  qam 12/07/19  Yes Plovsky, Berneta Sages, MD  busPIRone (BUSPAR) 15 MG tablet TAKE 2 TABLETS BY MOUTH IN THE MORNING  AND TAKE 2 TABLETS BY MOUTH IN THE EVENING 12/07/19  Yes Plovsky, Berneta Sages, MD  clopidogrel (PLAVIX) 75 MG tablet TAKE 1 TABLET BY MOUTH EVERY DAY 09/21/19  Yes Pleas Koch, NP  Ferrous Sulfate (IRON PO) Take by mouth.   Yes [provider]  glipiZIDE (GLUCOTROL XL) 2.5 MG 24 hr tablet Take 1 tablet (2.5 mg total) by mouth daily with breakfast. For diabetes. 01/24/20  Yes Pleas Koch, NP  glucose blood (ONETOUCH ULTRA) test strip USE TO CHECK BLOOD SUGAR UP TO 4 TIMES A DAILY Dx is E11.9 01/24/20  Yes Pleas Koch, NP  hydrOXYzine (VISTARIL) 25 MG capsule TAKE 2 TABLETS BY MOUTH IN  THE MORNING, 2 TABLETS AFTER 1PM, THEN 1 TABLET AS NEEDED FOR ANXIETY 12/07/19  Yes Plovsky, Berneta Sages, MD  Multiple Vitamin (MULTIVITAMIN) capsule Take 1 capsule by mouth daily.   Yes [provider]  OneTouch Delica Lancets 67H MISC USE AS INSTRUCTED TO BLOOD SUGAR UP TO 4 TIMES DAILY Dx is E11.9 01/24/20  Yes Pleas Koch, NP  OXYGEN 3 L continuous.    Yes [provider]  rosuvastatin (CRESTOR) 10 MG tablet Take 1 tablet (10 mg total) by mouth every evening. For cholesterol. 01/24/20  Yes Pleas Koch, NP  SPIRIVA HANDIHALER 18 MCG inhalation capsule INHALE 1 CAPSULE VIA HANDIHALER ONCE DAILY AT THE SAME TIME EVERY DAY 11/16/19  Yes Young, Clinton D, MD  umeclidinium-vilanterol (ANORO ELLIPTA) 62.5-25 MCG/INH AEPB Inhale 1 puff into the lungs daily. 01/30/20  Yes Young, Tarri Fuller D, MD    ROS:  Out of a complete 14 system review of symptoms, the patient complains only of the following symptoms, and all other reviewed systems are negative.  Walking problem, dizziness Memory problems Mouth movements  Blood pressure 124/66, pulse (!) 106, temperature (!) 97.5 F (36.4 C), temperature source Temporal, height 5\' 2"  (1.575 m), weight 115 lb (52.2 kg).  Physical Exam  General: The patient is alert and cooperative at the time of the examination.  Skin: No significant peripheral edema is noted.   Neurologic Exam  Mental status: The patient is alert and oriented x 3 at the time of the examination. The patient has apparent normal recent and remote memory, with an apparently normal attention span and concentration ability.   Cranial nerves: Facial symmetry is present. Speech is normal, no aphasia or dysarthria is noted. Extraocular movements are full. Visual fields are full.  No significant tardive movements were seen.  With extraocular movements, the patient had coarse nystagmus when looking to the left, not to the right.  Motor: The patient has good strength in all 4  extremities.  Sensory examination: Soft touch sensation is symmetric on the face, arms, and legs.  Coordination: The patient has good finger-nose-finger and heel-to-shin bilaterally.  Gait and station: The patient is able to walk independently, gait is slightly wide-based.  Romberg is unsteady, but the patient does not fall.  Tandem gait was not attempted.  Reflexes: Deep tendon reflexes are symmetric, but appear to be somewhat brisk throughout.   Assessment/Plan:  1.  Normal pressure hydrocephalus, status post VP shunt  2.  Cerebrovascular disease, small vessel disease  3.  Tardive dyskinesia  4.  Postural dizziness  The patient reports that when she feels dizzy, if she touches a table or chair, the dizziness markedly improves.  The patient likely is describing a sensation of imbalance that may be in part related to her cerebrovascular disease.  We will recheck  a CT of the brain, the patient will stop the hydroxyzine.  Hydroxyzine usually does not cause tardive dyskinesia, but anecdotal reports are available online.  The patient will be given a prescription for a walker which should help her dizziness issues, she will follow-up here in 4 months.  Jill Alexanders MD 02/01/2020 7:52 AM  [facility] [address] [place] [address] [place], [place] [address]  Phone [phone] Fax [phone]

## 2020-02-02 ENCOUNTER — Other Ambulatory Visit: Payer: Self-pay | Admitting: Primary Care

## 2020-02-02 ENCOUNTER — Telehealth: Payer: Self-pay | Admitting: Neurology

## 2020-02-02 DIAGNOSIS — E785 Hyperlipidemia, unspecified: Secondary | ICD-10-CM

## 2020-02-02 NOTE — Telephone Encounter (Signed)
I called and LVM with daughter because we received a walker today from [place]. I am not sure why it was delivered to our office. The billing address is listed as patient's home address. I called daughter to let her know to come pick it up. FYI

## 2020-02-03 ENCOUNTER — Other Ambulatory Visit: Payer: Self-pay | Admitting: Primary Care

## 2020-02-03 NOTE — Telephone Encounter (Signed)
error 

## 2020-02-04 NOTE — Assessment & Plan Note (Signed)
In remission.

## 2020-02-04 NOTE — Assessment & Plan Note (Signed)
Remains dependent on O2 2-3L

## 2020-02-04 NOTE — Assessment & Plan Note (Addendum)
Clinically stable Plan update CXR and reschedule PFT delayed by covid. Sample trial Anoro for better control.

## 2020-02-06 NOTE — Telephone Encounter (Signed)
Pt daughter has called ane message from [employer] was relayed to her, she stated pt already has a walker at home.  Daughter states she will call the company to make arrangements to come and pick up the walker here at the office.  This is FYI no call back requested.

## 2020-02-06 NOTE — Telephone Encounter (Signed)
Called patient's daughter again and LVM asking her to call us back regarding the walker that was delivered to our office on Thursday.

## 2020-02-07 ENCOUNTER — Telehealth: Payer: Self-pay | Admitting: Internal Medicine

## 2020-02-07 MED ORDER — ANORO ELLIPTA 62.5-25 MCG/INH IN AEPB: 1.0000 | INHALATION_SPRAY | Freq: Every day | RESPIRATORY_TRACT | 0 refills | Status: DC

## 2020-02-07 MED ORDER — ANORO ELLIPTA 62.5-25 MCG/INH IN AEPB: 1.0000 | INHALATION_SPRAY | Freq: Every day | RESPIRATORY_TRACT | 6 refills | Status: DC

## 2020-02-07 NOTE — Telephone Encounter (Signed)
Spoke with pt's daughter, [name]. She states that pt has used the samples of Anoro that were given to her at [facility]. PT messed up on a couple of the doses but does feel it makes a difference an would like to stay on it. Advised that I would leave 1 sample at front and call in rx to pharmacy. She verbalized understanding. Nothing further needed.

## 2020-02-15 ENCOUNTER — Other Ambulatory Visit: Payer: Self-pay

## 2020-02-15 ENCOUNTER — Ambulatory Visit
Admission: RE | Admit: 2020-02-15 | Discharge: 2020-02-15 | Disposition: A | Discharge status: Home / Self Care | Payer: Medicare PPO | Source: Ambulatory Visit | Attending: Neurology | Admitting: Neurology

## 2020-02-15 DIAGNOSIS — H814 Vertigo of central origin: Secondary | ICD-10-CM

## 2020-02-15 MED ORDER — STIOLTO RESPIMAT 2.5-2.5 MCG/ACT IN AERS: 2.0000 | INHALATION_SPRAY | Freq: Every day | RESPIRATORY_TRACT | 12 refills | Status: DC

## 2020-02-15 NOTE — Addendum Note (Signed)
Addended by: Elton Sin on: 02/15/2020 10:18 AM   Modules accepted: Orders

## 2020-02-15 NOTE — Telephone Encounter (Signed)
Please D/C Anoro and replace with Stiolto Respimat,  Inhale 2 puffs daily     # 1, ref x 12

## 2020-02-15 NOTE — Telephone Encounter (Signed)
Dr Annamaria Boots,   This message was received this morning for your Patient.  My insurance is not covering the inhaler you recently prescribed however the insurance suggested for [place] to use Stiolto Respimat  Solution instead... what are your thoughts about prescribing this substitution for Anoro?    Message routed to Dr. Annamaria Boots   Allergies  Allergen Reactions  . Lexapro [Escitalopram Oxalate] Other (See Comments)    Dizziness, nausea, fatigue  . Neosporin  [Neomycin-Polymyxin-Gramicidin] Rash    (Neosporin)   Current Outpatient Medications on File Prior to Visit  Medication Sig Dispense Refill  . albuterol (PROVENTIL) (2.5 MG/3ML) 0.083% nebulizer solution USE 1 VIAL IN NEBULIZER EVERY 6 HOURS - and as needed 75 mL 11  . albuterol (VENTOLIN HFA) 108 (90 Base) MCG/ACT inhaler INHALE 2 PUFFS BY MOUTH EVERY 4 HOURS AS NEEDED DX: J44.9 18 g 0  . alendronate (FOSAMAX) 70 MG tablet TAKE 1 TABLET BY MOUTH ONCE WEEKLY ON AN EMPTY STOMACH WITH WATER ONLY. DO NOT LAY FLAT FOR ONE HOUR 12 tablet 3  . buPROPion (WELLBUTRIN XL) 150 MG 24 hr tablet 2  qam 180 tablet 2  . busPIRone (BUSPAR) 15 MG tablet TAKE 2 TABLETS BY MOUTH IN THE MORNING AND TAKE 2 TABLETS BY MOUTH IN THE EVENING 360 tablet 1  . clopidogrel (PLAVIX) 75 MG tablet TAKE 1 TABLET BY MOUTH EVERY DAY 90 tablet 1  . Ferrous Sulfate (IRON PO) Take by mouth.    Marland Kitchen glipiZIDE (GLUCOTROL XL) 2.5 MG 24 hr tablet Take 1 tablet (2.5 mg total) by mouth daily with breakfast. For diabetes. 90 tablet 3  . glucose blood (ONETOUCH ULTRA) test strip USE TO CHECK BLOOD SUGAR UP TO 4 TIMES A DAILY Dx is E11.9 100 each 5  . hydrOXYzine (VISTARIL) 25 MG capsule TAKE 2 TABLETS BY MOUTH IN THE MORNING, 2 TABLETS AFTER 1PM, THEN 1 TABLET AS NEEDED FOR ANXIETY 450 capsule 1  . Multiple Vitamin (MULTIVITAMIN) capsule Take 1 capsule by mouth daily.    Glory Rosebush Delica Lancets 14E MISC USE AS INSTRUCTED TO BLOOD SUGAR UP TO 4 TIMES DAILY Dx is E11.9 100 each 5  . OXYGEN 3  L continuous.     . rosuvastatin (CRESTOR) 10 MG tablet Take 1 tablet (10 mg total) by mouth every evening. For cholesterol. 90 tablet 3  . SPIRIVA HANDIHALER 18 MCG inhalation capsule INHALE 1 CAPSULE VIA HANDIHALER ONCE DAILY AT THE SAME TIME EVERY DAY 30 capsule 12  . umeclidinium-vilanterol (ANORO ELLIPTA) 62.5-25 MCG/INH AEPB Inhale 1 puff into the lungs daily. 1 each 6   No current facility-administered medications on file prior to visit.

## 2020-02-17 ENCOUNTER — Telehealth: Payer: Self-pay | Admitting: Neurology

## 2020-02-17 NOTE — Telephone Encounter (Signed)
  I called the patient, the CT of the brain appears to be stable from 2019, the patient has fairly extensive small vessel disease, I suspect that this is in part related to her "dizziness" or sensation of imbalance.  She has gotten a walker which seems to have helped already.  The patient is on Plavix, she has a history of hypertension and diabetes.  CT head 02/16/20:  IMPRESSION:   CT head without demonstrating: -Stable right frontal intraventricular shunt catheter. -Moderate ventriculomegaly and moderate chronic small vessel ischemic disease. -Chronic right thalamic lacunar ischemic infarction. -Overall no change from CT on 08/16/2018.

## 2020-02-20 NOTE — Telephone Encounter (Signed)
I called and LVM for patient's daughter again because as of today, the walker still has not been picked up and is sitting in the front office. Requested that the patient have someone from the company come pick it up if they do not wish to have it.

## 2020-02-22 ENCOUNTER — Ambulatory Visit (INDEPENDENT_AMBULATORY_CARE_PROVIDER_SITE_OTHER): Payer: Medicare PPO | Admitting: Psychiatry

## 2020-02-22 ENCOUNTER — Other Ambulatory Visit: Payer: Self-pay

## 2020-02-22 DIAGNOSIS — F329 Major depressive disorder, single episode, unspecified: Secondary | ICD-10-CM | POA: Diagnosis not present

## 2020-02-22 DIAGNOSIS — F419 Anxiety disorder, unspecified: Secondary | ICD-10-CM | POA: Diagnosis not present

## 2020-02-22 DIAGNOSIS — F32A Depression, unspecified: Secondary | ICD-10-CM

## 2020-02-22 DIAGNOSIS — F325 Major depressive disorder, single episode, in full remission: Secondary | ICD-10-CM

## 2020-02-22 MED ORDER — BUPROPION HCL ER (XL) 150 MG PO TB24: ORAL_TABLET | ORAL | 2 refills | Status: DC

## 2020-02-22 MED ORDER — LORAZEPAM 0.5 MG PO TABS: ORAL_TABLET | ORAL | 3 refills | Status: DC

## 2020-02-22 MED ORDER — BUSPIRONE HCL 15 MG PO TABS: ORAL_TABLET | ORAL | 1 refills | Status: DC

## 2020-02-22 NOTE — Progress Notes (Signed)
Psychiatric Initial Adult Assessment   Patient Identification: Amanda Porter MRN:  [id number] Date of Evaluation:  02/22/2020 Referral Source: [facility] primary care Chief Complaint:   Visit Diagnosis: Adjustment disorder with anxiety  History of Present Illness: Anxiety   Today we had a session with Amanda Porter and the patient.  Amanda Porter is her daughter.  Patient says she is very anxious in the morning.  It bothers her a great deal.  Noted is that she has been taken off Vistaril by her neurologist with concerns about tardive dyskinesia.  I am not sure they are related.  She takes Wellbutrin 300 mg and says her mood is fairly good.  She is on what I considered to be the maximum dose of BuSpar 30 mg twice daily.  The patient is eating fairly well.  She is not psychotic.  She drinks no alcohol uses no drugs.  Is been on and on issues about psychotherapy for this patient.  Has had a hard time getting a therapist at the time of the pandemic.  She is not gotten phone calls back from different therapy centers that we have sent her to.  Actually her daughter is frustrated.  Patient is walking well.  Physically she is okay.  She is not had any falls.  She is alert and lucid.  She is engaging. (Hypo) Manic Symptoms:   Anxiety Symptoms:  Excessive Worry, Psychotic Symptoms:   PTSD Symptoms:   Past Psychiatric History: Multiple antidepressants, SSRIs Remeron  Previous Psychotropic Medications:   Substance Abuse History in the last 12 months:    Consequences of Substance Abuse:   Past Medical History:  Past Medical History:  Diagnosis Date  . Anxiety   . Anxiety and depression   . COPD (chronic obstructive pulmonary disease) ([place])   . CVA (cerebral vascular accident) ([place])   . Dementia ([place])   . Depression   . Diabetes mellitus without complication ([place])   . Dysphasia   . History of lung cancer   . Hyperlipidemia   . Memory disturbance 11/20/2017  . Normal pressure hydrocephalus ([place]) 2018   . Retinal detachment    Right  . Right carotid bruit 11/20/2017  . Tardive dyskinesia 02/01/2020  . Thrombosis    Arterial to lower extremity?    Past Surgical History:  Procedure Laterality Date  . LUNG REMOVAL, PARTIAL     left upper lobe  . VENTRICULOPERITONEAL SHUNT  12/09/2016    Family Psychiatric History:   Family History:  Family History  Problem Relation Age of Onset  . Pneumonia Mother   . Cancer Father   . Diabetes Sister   . Diabetes Sister   . Breast cancer Neg Hx     Social History:   Social History   Socioeconomic History  . Marital status: Divorced    Spouse name: Not on file  . Number of children: 1  . Years of education: Not on file  . Highest education level: Not on file  Occupational History  . Occupation: Retired  Tobacco Use  . Smoking status: Former Smoker    Packs/day: 0.50    Years: 50.00    Pack years: 25.00    Quit date: 08/01/2017    Years since quitting: 2.5  . Smokeless tobacco: Never Used  Substance and Sexual Activity  . Alcohol use: No  . Drug use: No  . Sexual activity: Not Currently  Other Topics Concern  . Not on file  Social History Narrative   Moved from [place].  Lives with daughter.   Right handed   Social Determinants of Health   Financial Resource Strain:   . Difficulty of Paying Living Expenses:   Food Insecurity:   . Worried About Charity fundraiser in the Last Year:   . Arboriculturist in the Last Year:   Transportation Needs:   . Film/video editor (Medical):   Marland Kitchen Lack of Transportation (Non-Medical):   Physical Activity:   . Days of Exercise per Week:   . Minutes of Exercise per Session:   Stress:   . Feeling of Stress :   Social Connections:   . Frequency of Communication with Friends and Family:   . Frequency of Social Gatherings with Friends and Family:   . Attends Religious Services:   . Active Member of Clubs or Organizations:   . Attends Archivist Meetings:   Marland Kitchen Marital Status:      Additional Social History:   Allergies:   Allergies  Allergen Reactions  . Lexapro [Escitalopram Oxalate] Other (See Comments)    Dizziness, nausea, fatigue  . Neosporin  [Neomycin-Polymyxin-Gramicidin] Rash    (Neosporin)    Metabolic Disorder Labs: Lab Results  Component Value Date   HGBA1C 7.8 (H) 01/17/2020   No results found for: PROLACTIN Lab Results  Component Value Date   CHOL 156 01/17/2020   TRIG 94.0 01/17/2020   HDL 55.80 01/17/2020   CHOLHDL 3 01/17/2020   VLDL 18.8 01/17/2020   LDLCALC 82 01/17/2020   LDLCALC 77 09/15/2017   No results found for: TSH  Therapeutic Level Labs: No results found for: LITHIUM No results found for: CBMZ No results found for: VALPROATE  Current Medications: Current Outpatient Medications  Medication Sig Dispense Refill  . albuterol (PROVENTIL) (2.5 MG/3ML) 0.083% nebulizer solution USE 1 VIAL IN NEBULIZER EVERY 6 HOURS - and as needed 75 mL 11  . albuterol (VENTOLIN HFA) 108 (90 Base) MCG/ACT inhaler INHALE 2 PUFFS BY MOUTH EVERY 4 HOURS AS NEEDED DX: J44.9 18 g 0  . alendronate (FOSAMAX) 70 MG tablet TAKE 1 TABLET BY MOUTH ONCE WEEKLY ON AN EMPTY STOMACH WITH WATER ONLY. DO NOT LAY FLAT FOR ONE HOUR 12 tablet 3  . buPROPion (WELLBUTRIN XL) 150 MG 24 hr tablet 2  qam 180 tablet 2  . busPIRone (BUSPAR) 15 MG tablet TAKE 2 TABLETS BY MOUTH IN THE MORNING AND TAKE 2 TABLETS BY MOUTH IN THE EVENING 360 tablet 1  . clopidogrel (PLAVIX) 75 MG tablet TAKE 1 TABLET BY MOUTH EVERY DAY 90 tablet 1  . Ferrous Sulfate (IRON PO) Take by mouth.    Marland Kitchen glipiZIDE (GLUCOTROL XL) 2.5 MG 24 hr tablet Take 1 tablet (2.5 mg total) by mouth daily with breakfast. For diabetes. 90 tablet 3  . glucose blood (ONETOUCH ULTRA) test strip USE TO CHECK BLOOD SUGAR UP TO 4 TIMES A DAILY Dx is E11.9 100 each 5  . LORazepam (ATIVAN) 0.5 MG tablet 1  q am 30 tablet 3  . Multiple Vitamin (MULTIVITAMIN) capsule Take 1 capsule by mouth daily.    Glory Rosebush  Delica Lancets 55D MISC USE AS INSTRUCTED TO BLOOD SUGAR UP TO 4 TIMES DAILY Dx is E11.9 100 each 5  . OXYGEN 3 L continuous.     . rosuvastatin (CRESTOR) 10 MG tablet Take 1 tablet (10 mg total) by mouth every evening. For cholesterol. 90 tablet 3  . SPIRIVA HANDIHALER 18 MCG inhalation capsule INHALE 1 CAPSULE VIA HANDIHALER ONCE DAILY  AT THE SAME TIME EVERY DAY 30 capsule 12  . Tiotropium Bromide-Olodaterol (STIOLTO RESPIMAT) 2.5-2.5 MCG/ACT AERS Inhale 2 puffs into the lungs daily. 4 g 12  . umeclidinium-vilanterol (ANORO ELLIPTA) 62.5-25 MCG/INH AEPB Inhale 1 puff into the lungs daily. 1 each 6   No current facility-administered medications for this visit.    Musculoskeletal: Strength & Muscle Tone: decreased Gait & Station: normal Patient leans: N/A  Psychiatric Specialty Exam: ROS  There were no vitals taken for this visit.There is no height or weight on file to calculate BMI.  General Appearance: Casual  Eye Contact:  Good  Speech:  Clear and Coherent  Volume:  Normal  Mood:  Anxious  Affect:  Appropriate  Thought Process:  Coherent  Orientation:  NA  Thought Content:  Logical  Suicidal Thoughts:  No  Homicidal Thoughts:  No  Memory:  Negative  Judgement:  Good  Insight:  Fair  Psychomotor Activity:  Normal  Concentration:    Recall:  [place] of Knowledge:Fair  Language: Fair  Akathisia:  No  Handed:  Right  AIMS (if indicated):  not done  Assets:  Desire for Improvement  ADL's:  Intact  Cognition: Impaired,  Mild  Sleep:  Good   Screenings: GAD-7     Office Visit from 02/18/2018 in [place] at [facility] Visit from 11/26/2017 in [place] at [facility]  Total GAD-7 Score  15  15    Mini-Mental     Office Visit from 05/24/2019 in [facility] Office Visit from 07/30/2018 in [facility] Office Visit from 11/20/2017 in [facility]  Total Score (max 30 points )  23  25  27      PHQ2-9     Nutrition from 03/26/2018 in [facility] from 03/02/2018 in [facility]  PHQ-2 Total Score  6  6  PHQ-9 Total Score  19  18      Assessment and Plan: \ 3/24/20215:03 PM   This patient's first problem is that of clinical depression.  She will continue taking Wellbutrin 300 mg as ordered.  The issue of tardive dyskinesia is not clear.  I will look forward to seeing her in the office in 2 months we will examine her.  She is taking the maximum dose of BuSpar 30 mg twice daily.  Her second problem therefore could be considered to be adjustment disorder with an anxious mood state.  At this time we will go ahead and begin on a very low-dose of Ativan with audiograms every morning.  Vistaril, Neurontin have been used in the past and apparently are not effective.  She is on the maximum dose of BuSpar.  I think the issue of psychotherapy is still an issue.  I will make an attempt to see if somebody can reach out to her to get her a therapist.  Not 100% sure that the patient is really interested but it is really a family issue more than anything.  Patient will be seen again in 2 months.  She is actually functioning fairly well.  She denies chest pain shortness of breath or any neurological symptoms at this time.

## 2020-03-07 NOTE — Telephone Encounter (Signed)
Amanda Porter, see message from patient. Are we able to do this? I wasn't sure.

## 2020-03-07 NOTE — Telephone Encounter (Signed)
Not sure if we have who know how.

## 2020-03-08 NOTE — Telephone Encounter (Signed)
We do not have anyone in the office that can notarize but the postage store in the [place] up the road can notarize that last time I checked.

## 2020-03-24 ENCOUNTER — Other Ambulatory Visit: Payer: Self-pay | Admitting: Primary Care

## 2020-03-24 DIAGNOSIS — M8580 Other specified disorders of bone density and structure, unspecified site: Secondary | ICD-10-CM

## 2020-03-26 ENCOUNTER — Other Ambulatory Visit: Payer: Self-pay | Admitting: Primary Care

## 2020-03-26 DIAGNOSIS — I639 Cerebral infarction, unspecified: Secondary | ICD-10-CM

## 2020-04-05 DIAGNOSIS — Z7951 Long term (current) use of inhaled steroids: Secondary | ICD-10-CM

## 2020-04-05 DIAGNOSIS — Z9981 Dependence on supplemental oxygen: Secondary | ICD-10-CM

## 2020-04-05 DIAGNOSIS — Z9181 History of falling: Secondary | ICD-10-CM

## 2020-04-05 DIAGNOSIS — Z85118 Personal history of other malignant neoplasm of bronchus and lung: Secondary | ICD-10-CM

## 2020-04-05 DIAGNOSIS — D509 Iron deficiency anemia, unspecified: Secondary | ICD-10-CM

## 2020-04-05 DIAGNOSIS — J449 Chronic obstructive pulmonary disease, unspecified: Secondary | ICD-10-CM | POA: Diagnosis not present

## 2020-04-05 DIAGNOSIS — I69354 Hemiplegia and hemiparesis following cerebral infarction affecting left non-dominant side: Secondary | ICD-10-CM | POA: Diagnosis not present

## 2020-04-05 DIAGNOSIS — M4302 Spondylolysis, cervical region: Secondary | ICD-10-CM

## 2020-04-05 DIAGNOSIS — E782 Mixed hyperlipidemia: Secondary | ICD-10-CM

## 2020-04-05 DIAGNOSIS — F419 Anxiety disorder, unspecified: Secondary | ICD-10-CM

## 2020-04-05 DIAGNOSIS — R1310 Dysphagia, unspecified: Secondary | ICD-10-CM

## 2020-04-05 DIAGNOSIS — G912 (Idiopathic) normal pressure hydrocephalus: Secondary | ICD-10-CM

## 2020-04-05 DIAGNOSIS — J9611 Chronic respiratory failure with hypoxia: Secondary | ICD-10-CM

## 2020-04-05 DIAGNOSIS — F329 Major depressive disorder, single episode, unspecified: Secondary | ICD-10-CM

## 2020-04-05 DIAGNOSIS — E785 Hyperlipidemia, unspecified: Secondary | ICD-10-CM

## 2020-04-05 DIAGNOSIS — E119 Type 2 diabetes mellitus without complications: Secondary | ICD-10-CM | POA: Diagnosis not present

## 2020-04-05 DIAGNOSIS — M21372 Foot drop, left foot: Secondary | ICD-10-CM

## 2020-04-05 DIAGNOSIS — I10 Essential (primary) hypertension: Secondary | ICD-10-CM

## 2020-04-05 DIAGNOSIS — F0390 Unspecified dementia without behavioral disturbance: Secondary | ICD-10-CM

## 2020-04-05 DIAGNOSIS — Z7902 Long term (current) use of antithrombotics/antiplatelets: Secondary | ICD-10-CM

## 2020-04-05 DIAGNOSIS — Z87891 Personal history of nicotine dependence: Secondary | ICD-10-CM

## 2020-04-10 ENCOUNTER — Other Ambulatory Visit: Payer: Self-pay | Admitting: Primary Care

## 2020-04-10 DIAGNOSIS — E785 Hyperlipidemia, unspecified: Secondary | ICD-10-CM

## 2020-04-10 DIAGNOSIS — E119 Type 2 diabetes mellitus without complications: Secondary | ICD-10-CM

## 2020-04-12 ENCOUNTER — Other Ambulatory Visit (HOSPITAL_COMMUNITY)
Admission: RE | Admit: 2020-04-12 | Discharge: 2020-04-12 | Disposition: A | Discharge status: Home / Self Care | Payer: Medicare PPO | Source: Ambulatory Visit | Attending: Internal Medicine | Admitting: Internal Medicine

## 2020-04-12 DIAGNOSIS — Z01812 Encounter for preprocedural laboratory examination: Secondary | ICD-10-CM | POA: Insufficient documentation

## 2020-04-12 DIAGNOSIS — Z20822 Contact with and (suspected) exposure to covid-19: Secondary | ICD-10-CM | POA: Insufficient documentation

## 2020-04-12 LAB — SARS CORONAVIRUS 2 (TAT 6-24 HRS): SARS Coronavirus 2: NEGATIVE

## 2020-04-16 ENCOUNTER — Other Ambulatory Visit: Payer: Self-pay

## 2020-04-16 ENCOUNTER — Ambulatory Visit (INDEPENDENT_AMBULATORY_CARE_PROVIDER_SITE_OTHER): Payer: Medicare PPO

## 2020-04-16 ENCOUNTER — Ambulatory Visit: Payer: Medicare PPO | Admitting: Internal Medicine

## 2020-04-16 DIAGNOSIS — R0600 Dyspnea, unspecified: Secondary | ICD-10-CM

## 2020-04-16 DIAGNOSIS — J449 Chronic obstructive pulmonary disease, unspecified: Secondary | ICD-10-CM | POA: Diagnosis not present

## 2020-04-16 DIAGNOSIS — R0609 Other forms of dyspnea: Secondary | ICD-10-CM

## 2020-04-16 LAB — PULMONARY FUNCTION TEST
DL/VA % pred: 47 %
DL/VA: 1.98 ml/min/mmHg/L
DLCO cor % pred: 33 %
DLCO cor: 6.17 ml/min/mmHg
DLCO unc % pred: 33 %
DLCO unc: 6.17 ml/min/mmHg
FEF 25-75 Post: 0.34 L/sec
FEF 25-75 Pre: 0.28 L/sec
FEF2575-%Change-Post: 19 %
FEF2575-%Pred-Post: 23 %
FEF2575-%Pred-Pre: 19 %
FEV1-%Change-Post: 13 %
FEV1-%Pred-Post: 43 %
FEV1-%Pred-Pre: 38 %
FEV1-Post: 0.71 L
FEV1-Pre: 0.63 L
FEV1FVC-%Change-Post: 11 %
FEV1FVC-%Pred-Pre: 62 %
FEV6-%Change-Post: 2 %
FEV6-%Pred-Post: 66 %
FEV6-%Pred-Pre: 65 %
FEV6-Post: 1.34 L
FEV6-Pre: 1.31 L
FEV6FVC-%Change-Post: 0 %
FEV6FVC-%Pred-Post: 104 %
FEV6FVC-%Pred-Pre: 104 %
FVC-%Change-Post: 2 %
FVC-%Pred-Post: 63 %
FVC-%Pred-Pre: 62 %
FVC-Post: 1.34 L
FVC-Pre: 1.31 L
Post FEV1/FVC ratio: 53 %
Post FEV6/FVC ratio: 100 %
Pre FEV1/FVC ratio: 48 %
Pre FEV6/FVC Ratio: 100 %

## 2020-04-16 NOTE — Progress Notes (Signed)
Full PFT performed today. °

## 2020-04-23 ENCOUNTER — Other Ambulatory Visit (INDEPENDENT_AMBULATORY_CARE_PROVIDER_SITE_OTHER): Payer: Medicare PPO

## 2020-04-23 DIAGNOSIS — E785 Hyperlipidemia, unspecified: Secondary | ICD-10-CM

## 2020-04-23 DIAGNOSIS — E119 Type 2 diabetes mellitus without complications: Secondary | ICD-10-CM

## 2020-04-23 LAB — LIPID PANEL
Cholesterol: 144 mg/dL (ref 0–200)
HDL: 47.6 mg/dL (ref >39.00)
LDL Cholesterol: 80 mg/dL (ref 0–99)
NonHDL: 96.77
Total CHOL/HDL Ratio: 3
Triglycerides: 86 mg/dL (ref 0.0–149.0)
VLDL: 17.2 mg/dL (ref 0.0–40.0)

## 2020-04-23 LAB — HEMOGLOBIN A1C: Hgb A1c MFr Bld: 6.7 % — ABNORMAL HIGH (ref 4.6–6.5)

## 2020-04-23 MED ORDER — ROSUVASTATIN CALCIUM 20 MG PO TABS: 20.0000 mg | ORAL_TABLET | Freq: Every evening | ORAL | 3 refills | Status: DC

## 2020-04-25 ENCOUNTER — Ambulatory Visit (HOSPITAL_COMMUNITY): Payer: Self-pay | Admitting: Psychiatry

## 2020-04-25 ENCOUNTER — Other Ambulatory Visit: Payer: Self-pay

## 2020-05-22 ENCOUNTER — Telehealth (INDEPENDENT_AMBULATORY_CARE_PROVIDER_SITE_OTHER): Payer: Medicare PPO | Admitting: Psychiatry

## 2020-05-22 DIAGNOSIS — F32A Depression, unspecified: Secondary | ICD-10-CM

## 2020-05-22 DIAGNOSIS — F419 Anxiety disorder, unspecified: Secondary | ICD-10-CM | POA: Diagnosis not present

## 2020-05-22 DIAGNOSIS — F329 Major depressive disorder, single episode, unspecified: Secondary | ICD-10-CM

## 2020-05-22 DIAGNOSIS — F325 Major depressive disorder, single episode, in full remission: Secondary | ICD-10-CM

## 2020-05-22 MED ORDER — BUSPIRONE HCL 15 MG PO TABS: ORAL_TABLET | ORAL | 1 refills | Status: DC

## 2020-05-22 MED ORDER — BUPROPION HCL ER (XL) 150 MG PO TB24: ORAL_TABLET | ORAL | 5 refills | Status: DC

## 2020-05-22 MED ORDER — LORAZEPAM 0.5 MG PO TABS: ORAL_TABLET | ORAL | 3 refills | Status: DC

## 2020-05-22 NOTE — Progress Notes (Signed)
Psychiatric Initial Adult Assessment   Patient Identification: Amanda Porter MRN:  [id number] Date of Evaluation:  05/22/2020 Referral Source: [facility] primary care Chief Complaint:   Visit Diagnosis: Adjustment disorder with anxiety  History of Present Illness: Anxiety   Today the patient is doing better.  She is seen with actually her daughter.  She is also staying with her grandchild.  Patient says she is distinctly better.  She feels much less anxious during the day.  She denies being depressed.  She is actually sleeping and eating quite well.  She watches a lot of social problems.  The patient takes her medicines just as prescribed.  Patient stays active.  She lives with her daughter and her grandchild who are with [place] today.  The patient is enjoying life.  She is functioning reasonably well.  She does all her basic ADLs.  She is on a walker and she wears oxygen.  The patient is functioning fairly well.  She does all her basic ADLs.  The patient drinks no alcohol uses no drugs.  She shows no evidence of psychosis.  The patient actually found a therapist that for about 2 or 3 sessions it worked.  However apparently has issues with insurance once again.  The patient has [employer] in the center that is giving her therapy is having issues with getting a contract with them.  Nonetheless the patient seems to be actually stable.  She needs to be talked into being in therapy.  Again I think it is my for the family and her devoted daughter [name] that for the patient.  Patient seems calm and content. (Hypo) Manic Symptoms:   Anxiety Symptoms:  Excessive Worry, Psychotic Symptoms:   PTSD Symptoms:   Past Psychiatric History: Multiple antidepressants, SSRIs Remeron  Previous Psychotropic Medications:   Substance Abuse History in the last 12 months:    Consequences of Substance Abuse:   Past Medical History:  Past Medical History:  Diagnosis Date  . Anxiety   . Anxiety and depression   .  COPD (chronic obstructive pulmonary disease) ([place])   . CVA (cerebral vascular accident) ([place])   . Dementia ([place])   . Depression   . Diabetes mellitus without complication ([place])   . Dysphasia   . History of lung cancer   . Hyperlipidemia   . Memory disturbance 11/20/2017  . Normal pressure hydrocephalus ([place]) 2018  . Retinal detachment    Right  . Right carotid bruit 11/20/2017  . Tardive dyskinesia 02/01/2020  . Thrombosis    Arterial to lower extremity?    Past Surgical History:  Procedure Laterality Date  . LUNG REMOVAL, PARTIAL     left upper lobe  . VENTRICULOPERITONEAL SHUNT  12/09/2016    Family Psychiatric History:   Family History:  Family History  Problem Relation Age of Onset  . Pneumonia Mother   . Cancer Father   . Diabetes Sister   . Diabetes Sister   . Breast cancer Neg Hx     Social History:   Social History   Socioeconomic History  . Marital status: Divorced    Spouse name: Not on file  . Number of children: 1  . Years of education: Not on file  . Highest education level: Not on file  Occupational History  . Occupation: Retired  Tobacco Use  . Smoking status: Former Smoker    Packs/day: 0.50    Years: 50.00    Pack years: 25.00    Quit date: 08/01/2017  Years since quitting: 2.8  . Smokeless tobacco: Never Used  Vaping Use  . Vaping Use: Never used  Substance and Sexual Activity  . Alcohol use: No  . Drug use: No  . Sexual activity: Not Currently  Other Topics Concern  . Not on file  Social History Narrative   Moved from [place].   Lives with daughter.   Right handed   Social Determinants of Health   Financial Resource Strain:   . Difficulty of Paying Living Expenses:   Food Insecurity:   . Worried About Charity fundraiser in the Last Year:   . Arboriculturist in the Last Year:   Transportation Needs:   . Film/video editor (Medical):   Marland Kitchen Lack of Transportation (Non-Medical):   Physical Activity:   . Days of Exercise per  Week:   . Minutes of Exercise per Session:   Stress:   . Feeling of Stress :   Social Connections:   . Frequency of Communication with Friends and Family:   . Frequency of Social Gatherings with Friends and Family:   . Attends Religious Services:   . Active Member of Clubs or Organizations:   . Attends Archivist Meetings:   Marland Kitchen Marital Status:     Additional Social History:   Allergies:   Allergies  Allergen Reactions  . Lexapro [Escitalopram Oxalate] Other (See Comments)    Dizziness, nausea, fatigue  . Neosporin  [Neomycin-Polymyxin-Gramicidin] Rash    (Neosporin)    Metabolic Disorder Labs: Lab Results  Component Value Date   HGBA1C 6.7 (H) 04/23/2020   No results found for: PROLACTIN Lab Results  Component Value Date   CHOL 144 04/23/2020   TRIG 86.0 04/23/2020   HDL 47.60 04/23/2020   CHOLHDL 3 04/23/2020   VLDL 17.2 04/23/2020   [place] 80 04/23/2020   LDLCALC 82 01/17/2020   No results found for: TSH  Therapeutic Level Labs: No results found for: LITHIUM No results found for: CBMZ No results found for: VALPROATE  Current Medications: Current Outpatient Medications  Medication Sig Dispense Refill  . albuterol (PROVENTIL) (2.5 MG/3ML) 0.083% nebulizer solution USE 1 VIAL IN NEBULIZER EVERY 6 HOURS - and as needed 75 mL 11  . albuterol (VENTOLIN HFA) 108 (90 Base) MCG/ACT inhaler INHALE 2 PUFFS BY MOUTH EVERY 4 HOURS AS NEEDED DX: J44.9 18 g 0  . alendronate (FOSAMAX) 70 MG tablet TAKE 1 TABLET BY MOUTH ONCE WEEKLY ON AN EMPTY STOMACH WITH WATER ONLY. DO NOT LAY FLAT FOR ONE HOUR 12 tablet 3  . buPROPion (WELLBUTRIN XL) 150 MG 24 hr tablet 2  qam 180 tablet 5  . busPIRone (BUSPAR) 15 MG tablet TAKE 2 TABLETS BY MOUTH IN THE MORNING AND TAKE 2 TABLETS BY MOUTH IN THE EVENING 360 tablet 1  . clopidogrel (PLAVIX) 75 MG tablet TAKE 1 TABLET BY MOUTH EVERY DAY 90 tablet 1  . Ferrous Sulfate (IRON PO) Take by mouth.    Marland Kitchen glipiZIDE (GLUCOTROL XL) 2.5 MG  24 hr tablet Take 1 tablet (2.5 mg total) by mouth daily with breakfast. For diabetes. 90 tablet 3  . glucose blood (ONETOUCH ULTRA) test strip USE TO CHECK BLOOD SUGAR UP TO 4 TIMES A DAILY Dx is E11.9 100 each 5  . LORazepam (ATIVAN) 0.5 MG tablet 1  q am 30 tablet 3  . Multiple Vitamin (MULTIVITAMIN) capsule Take 1 capsule by mouth daily.    Glory Rosebush Delica Lancets 71I MISC USE AS INSTRUCTED  TO BLOOD SUGAR UP TO 4 TIMES DAILY Dx is E11.9 100 each 5  . OXYGEN 3 L continuous.     . rosuvastatin (CRESTOR) 20 MG tablet Take 1 tablet (20 mg total) by mouth every evening. For cholesterol. 90 tablet 3  . SPIRIVA HANDIHALER 18 MCG inhalation capsule INHALE 1 CAPSULE VIA HANDIHALER ONCE DAILY AT THE SAME TIME EVERY DAY 30 capsule 12  . Tiotropium Bromide-Olodaterol (STIOLTO RESPIMAT) 2.5-2.5 MCG/ACT AERS Inhale 2 puffs into the lungs daily. 4 g 12  . umeclidinium-vilanterol (ANORO ELLIPTA) 62.5-25 MCG/INH AEPB Inhale 1 puff into the lungs daily. 1 each 6   No current facility-administered medications for this visit.    Musculoskeletal: Strength & Muscle Tone: decreased Gait & Station: normal Patient leans: N/A  Psychiatric Specialty Exam: ROS  There were no vitals taken for this visit.There is no height or weight on file to calculate BMI.  General Appearance: Casual  Eye Contact:  Good  Speech:  Clear and Coherent  Volume:  Normal  Mood:  Anxious  Affect:  Appropriate  Thought Process:  Coherent  Orientation:  NA  Thought Content:  Logical  Suicidal Thoughts:  No  Homicidal Thoughts:  No  Memory:  Negative  Judgement:  Good  Insight:  Fair  Psychomotor Activity:  Normal  Concentration:    Recall:  [place] of Knowledge:Fair  Language: Fair  Akathisia:  No  Handed:  Right  AIMS (if indicated):  not done  Assets:  Desire for Improvement  ADL's:  Intact  Cognition: Impaired,  Mild  Sleep:  Good   Screenings: GAD-7     Office Visit from 02/18/2018 in [place] at  [facility] Visit from 11/26/2017 in [place] at [facility]  Total GAD-7 Score 15 15    Mini-Mental     Office Visit from 05/24/2019 in [facility] Office Visit from 07/30/2018 in [facility] Office Visit from 11/20/2017 in [facility]  Total Score (max 30 points ) 23 25 27     PHQ2-9     Nutrition from 03/26/2018 in [facility] from 03/02/2018 in [facility]  PHQ-2 Total Score 6 6  PHQ-9 Total Score 19 18      Assessment and Plan: \ 6/22/20214:07 PM   This patient's first problem is that of major clinical depression.  She takes Wellbutrin 300 mg and does very well.  Her second problem is an adjustment disorder with an anxious mood state.  She takes BuSpar 30 mg twice daily.  She also takes Ativan 1.5 mg once a day.  The patient failed taking Vistaril and Neurontin.  I think the patient still is going to look into having psychotherapy.  Patient will return to see [place] in 3 to 4 months.  I think she is very stable at this time.

## 2020-06-07 ENCOUNTER — Ambulatory Visit: Payer: Medicare PPO | Admitting: Adult Health

## 2020-06-14 ENCOUNTER — Other Ambulatory Visit: Payer: Self-pay | Admitting: Primary Care

## 2020-06-14 DIAGNOSIS — E785 Hyperlipidemia, unspecified: Secondary | ICD-10-CM

## 2020-06-14 DIAGNOSIS — J449 Chronic obstructive pulmonary disease, unspecified: Secondary | ICD-10-CM

## 2020-06-15 NOTE — Telephone Encounter (Signed)
Last prescribed on 01/24/2020 Last OV (cpe) with [name] on 01/24/2020 Future OV scheduled on 07/23/2020

## 2020-06-17 NOTE — Telephone Encounter (Signed)
Patient no longer on rosuvastatin 5 mg, now taking 20 mg.  Refill provided for albuterol inhaler.

## 2020-06-25 DIAGNOSIS — J449 Chronic obstructive pulmonary disease, unspecified: Secondary | ICD-10-CM | POA: Diagnosis not present

## 2020-06-28 DIAGNOSIS — J449 Chronic obstructive pulmonary disease, unspecified: Secondary | ICD-10-CM | POA: Diagnosis not present

## 2020-07-02 ENCOUNTER — Ambulatory Visit: Payer: Medicare PPO | Admitting: Adult Health

## 2020-07-02 ENCOUNTER — Encounter: Payer: Self-pay | Admitting: Adult Health

## 2020-07-02 VITALS — BP 131/76 | HR 70 | Ht 62.0 in | Wt 115.0 lb

## 2020-07-02 DIAGNOSIS — G912 (Idiopathic) normal pressure hydrocephalus: Secondary | ICD-10-CM

## 2020-07-02 DIAGNOSIS — R42 Dizziness and giddiness: Secondary | ICD-10-CM

## 2020-07-02 DIAGNOSIS — R413 Other amnesia: Secondary | ICD-10-CM

## 2020-07-02 NOTE — Progress Notes (Signed)
I have read the note, and I agree with the clinical assessment and plan.  Brantleigh Mifflin K Manessa Buley   

## 2020-07-02 NOTE — Patient Instructions (Signed)
Your Plan:  Continue  To monitor symptoms  If your symptoms worsen or you develop new symptoms please let us know.    Thank you for coming to see [place] at [facility]. I hope we have been able to provide you high quality care today.  You may receive a patient satisfaction survey over the next few weeks. We would appreciate your feedback and comments so that we may continue to improve ourselves and the health of our patients.

## 2020-07-02 NOTE — Progress Notes (Signed)
PATIENT: Amanda Porter DOB: 1946/05/29  REASON FOR VISIT: follow up HISTORY FROM: patient  HISTORY OF PRESENT ILLNESS: Today 07/02/20:  [name] is a 74 year old female with a history of normal pressure Hydrocephalus with VP shunt, right thalamic stroke, evidence of small vessel disease in the brain and memory disturbance.  She reports the dizziness has been ongoing.  She states that if she touches some the dizziness improves.  She does use a Rollator when ambulating.  Denies any falls.  She notices trouble with her short-term memory.  She lives at home alone but her daughter stays with her at night.  She does require assistance with some ADLs.  She is able to cook.  Daughter helps her with chores.  Denies any trouble sleeping.  She returns today for an evaluation.  HISTORY [name] is a 74 year old right-handed black female with a history of normal pressure hydrocephalus and a VP shunt placement.  The patient has a history of cerebrovascular disease, she has had a right thalamic stroke in the past, she has evidence of small vessel disease by CT of the brain.  The patient has had a longstanding history of dizziness.  The patient reports dizziness with standing, sometimes with stooping over.  She does not have any dizziness with sitting or lying down.  The patient in the past has had some cervical spondylosis problems and neck pain, this has improved over time.  The patient has been to an ENT physician and a cardiologist, the etiology of the dizziness has not been discerned.  The patient more recently has developed some tardive dyskinesia movements with the tongue and mouth and this started after initiation of hydroxyzine therapy, and worsened when this medication was increased.  The patient has not had any falls, she does not use a walker or cane for ambulation.  She reports no new numbness or weakness of the extremities.  She does have a history of some memory issues.  REVIEW OF  SYSTEMS: Out of a complete 14 system review of symptoms, the patient complains only of the following symptoms, and all other reviewed systems are negative.  See HPI  ALLERGIES: Allergies  Allergen Reactions  . Lexapro [Escitalopram Oxalate] Other (See Comments)    Dizziness, nausea, fatigue  . Neosporin  [Neomycin-Polymyxin-Gramicidin] Rash    (Neosporin)    HOME MEDICATIONS: Outpatient Medications Prior to Visit  Medication Sig Dispense Refill  . albuterol (PROVENTIL) (2.5 MG/3ML) 0.083% nebulizer solution USE 1 VIAL IN NEBULIZER EVERY 6 HOURS - and as needed 75 mL 11  . albuterol (VENTOLIN HFA) 108 (90 Base) MCG/ACT inhaler INHALE 2 PUFFS BY MOUTH EVERY 4 HOURS AS NEEDED DX: J44.9 18 g 0  . alendronate (FOSAMAX) 70 MG tablet TAKE 1 TABLET BY MOUTH ONCE WEEKLY ON AN EMPTY STOMACH WITH WATER ONLY. DO NOT LAY FLAT FOR ONE HOUR 12 tablet 3  . buPROPion (WELLBUTRIN XL) 150 MG 24 hr tablet 2  qam 180 tablet 5  . busPIRone (BUSPAR) 15 MG tablet TAKE 2 TABLETS BY MOUTH IN THE MORNING AND TAKE 2 TABLETS BY MOUTH IN THE EVENING 360 tablet 1  . clopidogrel (PLAVIX) 75 MG tablet TAKE 1 TABLET BY MOUTH EVERY DAY 90 tablet 1  . Ferrous Sulfate (IRON PO) Take by mouth.    Marland Kitchen glipiZIDE (GLUCOTROL XL) 2.5 MG 24 hr tablet Take 1 tablet (2.5 mg total) by mouth daily with breakfast. For diabetes. 90 tablet 3  . LORazepam (ATIVAN) 0.5 MG tablet 1  q am 30 tablet 3  . Multiple Vitamin (MULTIVITAMIN) capsule Take 1 capsule by mouth daily.    . OXYGEN 3 L continuous.     . rosuvastatin (CRESTOR) 20 MG tablet Take 1 tablet (20 mg total) by mouth every evening. For cholesterol. 90 tablet 3  . Tiotropium Bromide-Olodaterol (STIOLTO RESPIMAT) 2.5-2.5 MCG/ACT AERS Inhale 2 puffs into the lungs daily. 4 g 12  . glucose blood (ONETOUCH ULTRA) test strip USE TO CHECK BLOOD SUGAR UP TO 4 TIMES A DAILY Dx is E11.9 100 each 5  . OneTouch Delica Lancets 01U MISC USE AS INSTRUCTED TO BLOOD SUGAR UP TO 4 TIMES DAILY Dx is  E11.9 100 each 5  . SPIRIVA HANDIHALER 18 MCG inhalation capsule INHALE 1 CAPSULE VIA HANDIHALER ONCE DAILY AT THE SAME TIME EVERY DAY 30 capsule 12  . umeclidinium-vilanterol (ANORO ELLIPTA) 62.5-25 MCG/INH AEPB Inhale 1 puff into the lungs daily. 1 each 6   No facility-administered medications prior to visit.    PAST MEDICAL HISTORY: Past Medical History:  Diagnosis Date  . Anxiety   . Anxiety and depression   . COPD (chronic obstructive pulmonary disease) ([place])   . CVA (cerebral vascular accident) ([place])   . Dementia ([place])   . Depression   . Diabetes mellitus without complication ([place])   . Dysphasia   . History of lung cancer   . Hyperlipidemia   . Memory disturbance 11/20/2017  . Normal pressure hydrocephalus ([place]) 2018  . Retinal detachment    Right  . Right carotid bruit 11/20/2017  . Tardive dyskinesia 02/01/2020  . Thrombosis    Arterial to lower extremity?    PAST SURGICAL HISTORY: Past Surgical History:  Procedure Laterality Date  . LUNG REMOVAL, PARTIAL     left upper lobe  . VENTRICULOPERITONEAL SHUNT  12/09/2016    FAMILY HISTORY: Family History  Problem Relation Age of Onset  . Pneumonia Mother   . Cancer Father   . Diabetes Sister   . Diabetes Sister   . Breast cancer Neg Hx     SOCIAL HISTORY: Social History   Socioeconomic History  . Marital status: Divorced    Spouse name: Not on file  . Number of children: 1  . Years of education: Not on file  . Highest education level: Not on file  Occupational History  . Occupation: Retired  Tobacco Use  . Smoking status: Former Smoker    Packs/day: 0.50    Years: 50.00    Pack years: 25.00    Quit date: 08/01/2017    Years since quitting: 2.9  . Smokeless tobacco: Never Used  Vaping Use  . Vaping Use: Never used  Substance and Sexual Activity  . Alcohol use: No  . Drug use: No  . Sexual activity: Not Currently  Other Topics Concern  . Not on file  Social History Narrative   Moved from [place].     Lives with daughter.   Right handed   Social Determinants of Health   Financial Resource Strain:   . Difficulty of Paying Living Expenses:   Food Insecurity:   . Worried About Charity fundraiser in the Last Year:   . Arboriculturist in the Last Year:   Transportation Needs:   . Film/video editor (Medical):   Marland Kitchen Lack of Transportation (Non-Medical):   Physical Activity:   . Days of Exercise per Week:   . Minutes of Exercise per Session:   Stress:   . Feeling  of Stress :   Social Connections:   . Frequency of Communication with Friends and Family:   . Frequency of Social Gatherings with Friends and Family:   . Attends Religious Services:   . Active Member of Clubs or Organizations:   . Attends Archivist Meetings:   Marland Kitchen Marital Status:   Intimate Partner Violence:   . Fear of Current or Ex-Partner:   . Emotionally Abused:   Marland Kitchen Physically Abused:   . Sexually Abused:       PHYSICAL EXAM  Vitals:   07/02/20 1356  BP: 131/76  Pulse: 70  Weight: 115 lb (52.2 kg)  Height: 5\' 2"  (1.575 m)   Body mass index is 21.03 kg/m.   MMSE - Mini Mental State Exam 07/02/2020 05/24/2019 07/30/2018  Orientation to time 4 4 5   Orientation to Place 3 4 4   Registration 3 3 3   Attention/ Calculation 5 2 2   Recall 2 2 2   Language- name 2 objects 2 2 2   Language- repeat 1 1 1   Language- follow 3 step command 3 3 3   Language- read & follow direction 1 1 1   Write a sentence 1 1 1   Copy design 0 0 1  Total score 25 23 25      Generalized: Well developed, in no acute distress   Neurological examination  Mentation: Alert oriented to time, place, history taking. Follows all commands speech and language fluent Cranial nerve II-XII: Pupils were equal round reactive to light. Extraocular movements were full, visual field were full on confrontational test. Facial sensation and strength were normal. Uvula tongue midline. Head turning and shoulder shrug  were normal and  symmetric. Motor: The motor testing reveals 5 over 5 strength of all 4 extremities. Good symmetric motor tone is noted throughout.  Sensory: Sensory testing is intact to soft touch on all 4 extremities. No evidence of extinction is noted.  Coordination: Cerebellar testing reveals good finger-nose-finger and heel-to-shin bilaterally.  Gait and station: Uses a Rollator when ambulating Reflexes: Deep tendon reflexes are symmetric and normal bilaterally.   DIAGNOSTIC DATA (LABS, IMAGING, TESTING) - I reviewed patient records, labs, notes, testing and imaging myself where available.  Lab Results  Component Value Date   WBC 8.9 01/17/2020   HGB 11.7 (L) 01/17/2020   HCT 35.6 (L) 01/17/2020   MCV 91.0 01/17/2020   PLT 383.0 01/17/2020      Component Value Date/Time   NA 139 01/17/2020 0805   K 4.3 01/17/2020 0805   CL 99 01/17/2020 0805   CO2 31 01/17/2020 0805   GLUCOSE 184 (H) 01/17/2020 0805   BUN 13 01/17/2020 0805   CREATININE 0.86 01/17/2020 0805   CALCIUM 9.6 01/17/2020 0805   PROT 7.2 01/17/2020 0805   ALBUMIN 4.2 01/17/2020 0805   AST 23 01/17/2020 0805   ALT 24 01/17/2020 0805   ALKPHOS 81 01/17/2020 0805   BILITOT 0.3 01/17/2020 0805   GFRNONAA >60 03/24/2018 1004   GFRAA >60 03/24/2018 1004   Lab Results  Component Value Date   CHOL 144 04/23/2020   HDL 47.60 04/23/2020   LDLCALC 80 04/23/2020   TRIG 86.0 04/23/2020   CHOLHDL 3 04/23/2020   Lab Results  Component Value Date   HGBA1C 6.7 (H) 04/23/2020   Lab Results  Component Value Date   VITAMINB12 1,184 (H) 04/02/2018   No results found for: TSH    ASSESSMENT AND PLAN 74 y.o. year old female  has a past medical history of  Anxiety, Anxiety and depression, COPD (chronic obstructive pulmonary disease) ([place]), CVA (cerebral vascular accident) ([place]), Dementia ([place]), Depression, Diabetes mellitus without complication ([place]), Dysphasia, History of lung cancer, Hyperlipidemia, Memory disturbance (11/20/2017),  Normal pressure hydrocephalus ([place]) (2018), Retinal detachment, Right carotid bruit (11/20/2017), Tardive dyskinesia (02/01/2020), and Thrombosis. here with:  Normal pressure hydrocephalus   Stable  Dizziness   Stable but ongoing  Memory disturbance   MMSE 25/30- Stable  Continue to monitor  Advised to continue monitoring symptoms.  Follow-up in 1 year or sooner if needed   I spent 20 minutes of face-to-face and non-face-to-face time with patient.  This included previsit chart review, lab review, study review, order entry, electronic health record documentation, patient education.  Ward Givens, MSN, NP-C 07/02/2020, 2:07 PM [facility] [address] [place], [place] [address] [phone]

## 2020-07-05 DIAGNOSIS — E119 Type 2 diabetes mellitus without complications: Secondary | ICD-10-CM | POA: Diagnosis not present

## 2020-07-11 ENCOUNTER — Other Ambulatory Visit: Payer: Self-pay | Admitting: Primary Care

## 2020-07-11 DIAGNOSIS — J449 Chronic obstructive pulmonary disease, unspecified: Secondary | ICD-10-CM

## 2020-07-23 ENCOUNTER — Ambulatory Visit: Payer: Medicare PPO | Admitting: Primary Care

## 2020-07-23 DIAGNOSIS — J449 Chronic obstructive pulmonary disease, unspecified: Secondary | ICD-10-CM | POA: Diagnosis not present

## 2020-07-26 DIAGNOSIS — J449 Chronic obstructive pulmonary disease, unspecified: Secondary | ICD-10-CM | POA: Diagnosis not present

## 2020-08-02 ENCOUNTER — Ambulatory Visit: Payer: Medicare PPO | Admitting: Internal Medicine

## 2020-08-03 ENCOUNTER — Ambulatory Visit: Payer: Medicare PPO | Admitting: Primary Care

## 2020-08-23 ENCOUNTER — Telehealth (INDEPENDENT_AMBULATORY_CARE_PROVIDER_SITE_OTHER): Payer: Medicare PPO | Admitting: Psychiatry

## 2020-08-23 ENCOUNTER — Other Ambulatory Visit: Payer: Self-pay

## 2020-08-23 DIAGNOSIS — F329 Major depressive disorder, single episode, unspecified: Secondary | ICD-10-CM

## 2020-08-23 DIAGNOSIS — F419 Anxiety disorder, unspecified: Secondary | ICD-10-CM

## 2020-08-23 DIAGNOSIS — F324 Major depressive disorder, single episode, in partial remission: Secondary | ICD-10-CM

## 2020-08-23 MED ORDER — BUSPIRONE HCL 15 MG PO TABS: ORAL_TABLET | ORAL | 1 refills | Status: DC

## 2020-08-23 MED ORDER — LORAZEPAM 0.5 MG PO TABS: ORAL_TABLET | ORAL | 3 refills | Status: DC

## 2020-08-23 MED ORDER — BUPROPION HCL ER (XL) 150 MG PO TB24: ORAL_TABLET | ORAL | 5 refills | Status: DC

## 2020-08-23 NOTE — Progress Notes (Signed)
Psychiatric Initial Adult Assessment   Patient Identification: Amanda Porter MRN:  [id number] Date of Evaluation:  08/23/2020 Referral Source: [facility] primary care Chief Complaint:   Visit Diagnosis: Adjustment disorder with anxiety  History of Present Illness: Anxiety   Today the patient is doing fairly well.  She is at her baseline.  She is interviewed with her daughter Amanda Porter who lives with him.  The patient says she is not depressed.  She is sleeping most of the time and she is eating well.  She watches a lot of television and unfortunately she watches a lot of news which is anxiety provoking.  Overall her thoughts are clear and organized.  She shows no evidence of psychosis.  She drinks no alcohol uses no drugs.  Patient is functioning fairly well.  She does all her basic ADLs.  She is oxygen dependent and she uses a walker.  She is had a number of cerebrovascular events.  She takes her medicines just as prescribed.  I do believe her anxiety and depression are fairly well controlled.  Her daughter actually however thinks he is somewhat worse because she watches too much news.  My recommendation was to keep away from the news.  Patient's had both her vaccines. (Hypo) Manic Symptoms:   Anxiety Symptoms:  Excessive Worry, Psychotic Symptoms:   PTSD Symptoms:   Past Psychiatric History: Multiple antidepressants, SSRIs Remeron  Previous Psychotropic Medications:   Substance Abuse History in the last 12 months:    Consequences of Substance Abuse:   Past Medical History:  Past Medical History:  Diagnosis Date  . Anxiety   . Anxiety and depression   . COPD (chronic obstructive pulmonary disease) ([place])   . CVA (cerebral vascular accident) ([place])   . Dementia ([place])   . Depression   . Diabetes mellitus without complication ([place])   . Dysphasia   . History of lung cancer   . Hyperlipidemia   . Memory disturbance 11/20/2017  . Normal pressure hydrocephalus ([place]) 2018  . Retinal  detachment    Right  . Right carotid bruit 11/20/2017  . Tardive dyskinesia 02/01/2020  . Thrombosis    Arterial to lower extremity?    Past Surgical History:  Procedure Laterality Date  . LUNG REMOVAL, PARTIAL     left upper lobe  . VENTRICULOPERITONEAL SHUNT  12/09/2016    Family Psychiatric History:   Family History:  Family History  Problem Relation Age of Onset  . Pneumonia Mother   . Cancer Father   . Diabetes Sister   . Diabetes Sister   . Breast cancer Neg Hx     Social History:   Social History   Socioeconomic History  . Marital status: Divorced    Spouse name: Not on file  . Number of children: 1  . Years of education: Not on file  . Highest education level: Not on file  Occupational History  . Occupation: Retired  Tobacco Use  . Smoking status: Former Smoker    Packs/day: 0.50    Years: 50.00    Pack years: 25.00    Quit date: 08/01/2017    Years since quitting: 3.0  . Smokeless tobacco: Never Used  Vaping Use  . Vaping Use: Never used  Substance and Sexual Activity  . Alcohol use: No  . Drug use: No  . Sexual activity: Not Currently  Other Topics Concern  . Not on file  Social History Narrative   Moved from [place].   Lives with daughter.  Right handed   Social Determinants of Health   Financial Resource Strain:   . Difficulty of Paying Living Expenses: Not on file  Food Insecurity:   . Worried About Charity fundraiser in the Last Year: Not on file  . Ran Out of Food in the Last Year: Not on file  Transportation Needs:   . Lack of Transportation (Medical): Not on file  . Lack of Transportation (Non-Medical): Not on file  Physical Activity:   . Days of Exercise per Week: Not on file  . Minutes of Exercise per Session: Not on file  Stress:   . Feeling of Stress : Not on file  Social Connections:   . Frequency of Communication with Friends and Family: Not on file  . Frequency of Social Gatherings with Friends and Family: Not on file  .  Attends Religious Services: Not on file  . Active Member of Clubs or Organizations: Not on file  . Attends Archivist Meetings: Not on file  . Marital Status: Not on file    Additional Social History:   Allergies:   Allergies  Allergen Reactions  . Lexapro [Escitalopram Oxalate] Other (See Comments)    Dizziness, nausea, fatigue  . Neosporin  [Neomycin-Polymyxin-Gramicidin] Rash    (Neosporin)    Metabolic Disorder Labs: Lab Results  Component Value Date   HGBA1C 6.7 (H) 04/23/2020   No results found for: PROLACTIN Lab Results  Component Value Date   CHOL 144 04/23/2020   TRIG 86.0 04/23/2020   HDL 47.60 04/23/2020   CHOLHDL 3 04/23/2020   VLDL 17.2 04/23/2020   [place] 80 04/23/2020   LDLCALC 82 01/17/2020   No results found for: TSH  Therapeutic Level Labs: No results found for: LITHIUM No results found for: CBMZ No results found for: VALPROATE  Current Medications: Current Outpatient Medications  Medication Sig Dispense Refill  . albuterol (PROVENTIL) (2.5 MG/3ML) 0.083% nebulizer solution USE 1 VIAL IN NEBULIZER EVERY 6 HOURS - and as needed 75 mL 11  . albuterol (VENTOLIN HFA) 108 (90 Base) MCG/ACT inhaler INHALE 2 PUFFS BY MOUTH EVERY 4 HOURS AS NEEDED 18 g 0  . alendronate (FOSAMAX) 70 MG tablet TAKE 1 TABLET BY MOUTH ONCE WEEKLY ON AN EMPTY STOMACH WITH WATER ONLY. DO NOT LAY FLAT FOR ONE HOUR 12 tablet 3  . buPROPion (WELLBUTRIN XL) 150 MG 24 hr tablet 2  qam 180 tablet 5  . busPIRone (BUSPAR) 15 MG tablet TAKE 2 TABLETS BY MOUTH IN THE MORNING AND TAKE 2 TABLETS BY MOUTH IN THE EVENING 360 tablet 1  . clopidogrel (PLAVIX) 75 MG tablet TAKE 1 TABLET BY MOUTH EVERY DAY 90 tablet 1  . Ferrous Sulfate (IRON PO) Take by mouth.    Marland Kitchen glipiZIDE (GLUCOTROL XL) 2.5 MG 24 hr tablet Take 1 tablet (2.5 mg total) by mouth daily with breakfast. For diabetes. 90 tablet 3  . LORazepam (ATIVAN) 0.5 MG tablet 1  q am 30 tablet 3  . Multiple Vitamin (MULTIVITAMIN)  capsule Take 1 capsule by mouth daily.    . OXYGEN 3 L continuous.     . rosuvastatin (CRESTOR) 20 MG tablet Take 1 tablet (20 mg total) by mouth every evening. For cholesterol. 90 tablet 3  . Tiotropium Bromide-Olodaterol (STIOLTO RESPIMAT) 2.5-2.5 MCG/ACT AERS Inhale 2 puffs into the lungs daily. 4 g 12   No current facility-administered medications for this visit.    Musculoskeletal: Strength & Muscle Tone: decreased Gait & Station: normal Patient leans:  N/A  Psychiatric Specialty Exam: ROS  There were no vitals taken for this visit.There is no height or weight on file to calculate BMI.  General Appearance: Casual  Eye Contact:  Good  Speech:  Clear and Coherent  Volume:  Normal  Mood:  Anxious  Affect:  Appropriate  Thought Process:  Coherent  Orientation:  NA  Thought Content:  Logical  Suicidal Thoughts:  No  Homicidal Thoughts:  No  Memory:  Negative  Judgement:  Good  Insight:  Fair  Psychomotor Activity:  Normal  Concentration:    Recall:  Reading of Knowledge:Fair  Language: Fair  Akathisia:  No  Handed:  Right  AIMS (if indicated):  not done  Assets:  Desire for Improvement  ADL's:  Intact  Cognition: Impaired,  Mild  Sleep:  Good   Screenings: GAD-7     Office Visit from 02/18/2018 in [place] at [facility] Visit from 11/26/2017 in [place] at [facility]  Total GAD-7 Score 15 15    Mini-Mental     Office Visit from 07/02/2020 in [facility] Office Visit from 05/24/2019 in [facility] Office Visit from 07/30/2018 in [facility] Office Visit from 11/20/2017 in [facility]  Total Score (max 30 points ) 25 23 25 27     PHQ2-9     Nutrition from 03/26/2018 in [facility] from 03/02/2018 in [facility]  PHQ-2 Total Score 6 6  PHQ-9 Total Score 19 18      Assessment and Plan: \ 9/23/20214:06 PM    Patient has 2 problems.  First 1 is major depression.  She will continue taking Wellbutrin as prescribed.  Her second problem is that of an adjustment disorder with an anxious mood state.  For this she will continue taking BuSpar and a very small dose of Ativan.  The patient is positive and optimistic.  We will plan for her to return to see me hopefully in person in 3 months.

## 2020-08-26 DIAGNOSIS — J449 Chronic obstructive pulmonary disease, unspecified: Secondary | ICD-10-CM | POA: Diagnosis not present

## 2020-08-31 DIAGNOSIS — J449 Chronic obstructive pulmonary disease, unspecified: Secondary | ICD-10-CM | POA: Diagnosis not present

## 2020-09-05 ENCOUNTER — Ambulatory Visit: Payer: Medicare PPO | Admitting: Internal Medicine

## 2020-09-17 ENCOUNTER — Other Ambulatory Visit: Payer: Self-pay | Admitting: Primary Care

## 2020-09-17 DIAGNOSIS — I639 Cerebral infarction, unspecified: Secondary | ICD-10-CM

## 2020-09-18 ENCOUNTER — Encounter: Payer: Self-pay | Admitting: Cardiology

## 2020-09-18 ENCOUNTER — Ambulatory Visit (INDEPENDENT_AMBULATORY_CARE_PROVIDER_SITE_OTHER): Payer: Medicare PPO | Admitting: Cardiology

## 2020-09-18 ENCOUNTER — Other Ambulatory Visit: Payer: Self-pay

## 2020-09-18 VITALS — BP 134/80 | HR 93 | Ht 62.0 in | Wt 116.0 lb

## 2020-09-18 DIAGNOSIS — M79604 Pain in right leg: Secondary | ICD-10-CM

## 2020-09-18 DIAGNOSIS — Z7189 Other specified counseling: Secondary | ICD-10-CM

## 2020-09-18 DIAGNOSIS — I10 Essential (primary) hypertension: Secondary | ICD-10-CM | POA: Diagnosis not present

## 2020-09-18 DIAGNOSIS — R42 Dizziness and giddiness: Secondary | ICD-10-CM

## 2020-09-18 NOTE — Patient Instructions (Signed)
Medication Instructions:  No change *If you need a refill on your cardiac medications before your next appointment, please call your pharmacy*   Lab Work: None If you have labs (blood work) drawn today and your tests are completely normal, you will receive your results only by: Marland Kitchen MyChart Message (if you have MyChart) OR . A paper copy in the mail If you have any lab test that is abnormal or we need to change your treatment, we will call you to review the results.   Testing/Procedures: None   Follow-Up: At [facility], you and your health needs are our priority.  As part of our continuing mission to provide you with exceptional heart care, we have created designated Provider Care Teams.  These Care Teams include your primary Cardiologist (physician) and Advanced Practice Providers (APPs -  Physician Assistants and Nurse Practitioners) who all work together to provide you with the care you need, when you need it.  We recommend signing up for the patient portal called "MyChart".  Sign up information is provided on this After Visit Summary.  MyChart is used to connect with patients for Virtual Visits (Telemedicine).  Patients are able to view lab/test results, encounter notes, upcoming appointments, etc.  Non-urgent messages can be sent to your provider as well.   To learn more about what you can do with MyChart, go to [email or web address].    Your next appointment:   1 year  The format for your next appointment:   In Person  Provider:   Buford Dresser, MD

## 2020-09-18 NOTE — Progress Notes (Signed)
Cardiology Office Note:    Date:  09/18/2020   ID:  Amanda Porter, DOB 04/23/1946, MRN [id number]  PCP:  Pleas Koch, NP  Cardiologist:  Buford Dresser, MD PhD  Referring MD: Pleas Koch, NP   CC: follow up  History of Present Illness:    Amanda Porter is a 74 y.o. female with a hx of CVA, COPD on home O2, type II diabetes, NPH, dementia, anxiety, depression, dizziness, hypertension who is seen for follow up today. She was initially seen on 08/17/19 as a new consult at the request of Pleas Koch, NP for the evaluation and management of dizziness. Please see initial consult note for full history.  Today: Here with daughter (EMT) and granddaughter today  Still having dizziness. Saw neurologist, thought to be small vessel disease. I reviewed the notes from Dr. Jannifer Franklin and his team. Daughter is asking if there is something besides clopidogrel that I would recommend. She is on rosuvastatin and clopidogrel, would continue both at this time.  Having intermittent right leg pain. Started a few weeks ago, none recently. Daughter is concerned since she did not have anything but pain with her prior DVT on the left leg.  BP well controlled. No lows.   Denies chest pain, shortness of breath at rest or with normal exertion. No PND, orthopnea, LE edema or unexpected weight gain. No syncope or palpitations.  Past Medical History:  Diagnosis Date  . Anxiety   . Anxiety and depression   . COPD (chronic obstructive pulmonary disease) ([place])   . CVA (cerebral vascular accident) ([place])   . Dementia ([place])   . Depression   . Diabetes mellitus without complication ([place])   . Dysphasia   . History of lung cancer   . Hyperlipidemia   . Memory disturbance 11/20/2017  . Normal pressure hydrocephalus ([place]) 2018  . Retinal detachment    Right  . Right carotid bruit 11/20/2017  . Tardive dyskinesia 02/01/2020  . Thrombosis    Arterial to lower extremity?    Past  Surgical History:  Procedure Laterality Date  . LUNG REMOVAL, PARTIAL     left upper lobe  . VENTRICULOPERITONEAL SHUNT  12/09/2016    Current Medications: Current Outpatient Medications on File Prior to Visit  Medication Sig  . albuterol (PROVENTIL) (2.5 MG/3ML) 0.083% nebulizer solution USE 1 VIAL IN NEBULIZER EVERY 6 HOURS - and as needed  . albuterol (VENTOLIN HFA) 108 (90 Base) MCG/ACT inhaler INHALE 2 PUFFS BY MOUTH EVERY 4 HOURS AS NEEDED  . alendronate (FOSAMAX) 70 MG tablet TAKE 1 TABLET BY MOUTH ONCE WEEKLY ON AN EMPTY STOMACH WITH WATER ONLY. DO NOT LAY FLAT FOR ONE HOUR  . buPROPion (WELLBUTRIN XL) 150 MG 24 hr tablet 2  qam  . busPIRone (BUSPAR) 15 MG tablet TAKE 2 TABLETS BY MOUTH IN THE MORNING AND TAKE 2 TABLETS BY MOUTH IN THE EVENING  . clopidogrel (PLAVIX) 75 MG tablet TAKE 1 TABLET BY MOUTH EVERY DAY  . Ferrous Sulfate (IRON PO) Take by mouth.  Marland Kitchen glipiZIDE (GLUCOTROL XL) 2.5 MG 24 hr tablet Take 1 tablet (2.5 mg total) by mouth daily with breakfast. For diabetes.  Marland Kitchen LORazepam (ATIVAN) 0.5 MG tablet 1  q am  . Multiple Vitamin (MULTIVITAMIN) capsule Take 1 capsule by mouth daily.  . OXYGEN 3 L continuous.   . rosuvastatin (CRESTOR) 20 MG tablet Take 1 tablet (20 mg total) by mouth every evening. For cholesterol.  . Tiotropium Bromide-Olodaterol (STIOLTO RESPIMAT) 2.5-2.5 MCG/ACT  AERS Inhale 2 puffs into the lungs daily.   No current facility-administered medications on file prior to visit.     Allergies:   Lexapro [escitalopram oxalate] and Neosporin  [neomycin-polymyxin-gramicidin]   Social History   Tobacco Use  . Smoking status: Former Smoker    Packs/day: 0.50    Years: 50.00    Pack years: 25.00    Quit date: 08/01/2017    Years since quitting: 3.1  . Smokeless tobacco: Never Used  Vaping Use  . Vaping Use: Never used  Substance Use Topics  . Alcohol use: No  . Drug use: No    Family History: The patient's family history includes Cancer in her  father; Diabetes in her sister and sister; Pneumonia in her mother. There is no history of Breast cancer.  ROS:   Please see the history of present illness.  Additional pertinent ROS otherwise unremarkable.   EKGs/Labs/Other Studies Reviewed:    The following studies were reviewed today: Echo 08/25/19  1. Left ventricular ejection fraction, by visual estimation, is 65 to 70%. The left ventricle has normal function. Normal left ventricular size. There is no left ventricular hypertrophy.  2. Global right ventricle has normal systolic function.The right ventricular size is normal. No increase in right ventricular wall thickness.  3. Moderate calcification of the mitral valve leaflet(s).  4. Moderate mitral annular calcification.  5. Moderate thickening of the mitral valve leaflet(s).  6. The mitral valve is abnormal. No evidence of mitral valve regurgitation. No evidence of mitral stenosis.  Aortic Valve: The aortic valve is tricuspid. Aortic valve regurgitation is trivial by color flow Doppler. Aortic regurgitation PHT measures 512 msec.  Carotid duplex 12/22/2017 Final Interpretation: Right Carotid: Velocities in the right ICA are consistent with a 1-39% stenosis.  Left Carotid: Velocities in the left ICA are consistent with a 1-39% stenosis.  Vertebrals: Both vertebral arteries were patent with antegrade flow.  EKG:  EKG is personally reviewed.  The ekg ordered today demonstrates NSR at 93 bpm  Recent Labs: 01/17/2020: ALT 24; BUN 13; Creatinine, Ser 0.86; Hemoglobin 11.7; Platelets 383.0; Potassium 4.3; Sodium 139  Recent Lipid Panel    Component Value Date/Time   CHOL 144 04/23/2020 0803   TRIG 86.0 04/23/2020 0803   HDL 47.60 04/23/2020 0803   CHOLHDL 3 04/23/2020 0803   VLDL 17.2 04/23/2020 0803   LDLCALC 80 04/23/2020 0803    Physical Exam:    VS:  BP 134/80   Pulse 93   Ht 5\' 2"  (1.575 m)   Wt 116 lb (52.6 kg)   SpO2 95%   BMI 21.22 kg/m     Wt Readings from  Last 3 Encounters:  09/18/20 116 lb (52.6 kg)  07/02/20 115 lb (52.2 kg)  02/01/20 115 lb (52.2 kg)  GEN: Well nourished, well developed in no acute distress. On chronic O2 by nasal cannula HEENT: Normal, moist mucous membranes NECK: No JVD CARDIAC: regular rhythm, normal S1 and S2, no rubs or gallops. 1/6 systolic murmur. VASCULAR: Radial and DP pulses 2+ bilaterally. No carotid bruits RESPIRATORY:  Clear to auscultation without rales, wheezing or rhonchi  ABDOMEN: Soft, non-tender, non-distended MUSCULOSKELETAL:  Ambulates independently with a rollator. I examined both left and right legs. No tenderness, swelling, or cords noted. Strong distal pulses, no edema. SKIN: Warm and dry, no edema NEUROLOGIC:  Alert and oriented x 3. No focal neuro deficits noted. PSYCHIATRIC:  Normal affect   ASSESSMENT:    1. Lightheadedness   2. Essential  hypertension   3. Leg pain, right   4. Cardiac risk counseling    PLAN:    Dizziness/lightheadedness:  -continues, though no recent injuries -per neurology notes, suspect that chronic small vessel disease in the brain may be playing a tole  Right leg pain, intermittent: -normal exam today -counseled that if pain returns and stays, would consider ultrasound.  Hypertension: given previously low diastolic numbers, would be lenient with blood pressure target but with history of small vessel disease, do not want to allow numbers to be too elevated either. Would aim for 130-140/80.   Cardiac risk counseling and prevention recommendations: -recommend heart healthy/Mediterranean diet, with whole grains, fruits, vegetable, fish, lean meats, nuts, and olive oil. Limit salt. -recommend moderate walking, 3-5 times/week for 30-50 minutes each session. Aim for at least 150 minutes.week. Goal should be pace of 3 miles/hours, or walking 1.5 miles in 30 minutes -recommend avoidance of tobacco products. Avoid excess alcohol. -reported history of CVA, on  clopidogrel and rosuvastatin for secondary prevention  Plan for follow up: 12 mos or sooner PRN  Buford Dresser, MD, PhD [place]  [facility]   Medication Adjustments/Labs and Tests Ordered: Current medicines are reviewed at length with the patient today.  Concerns regarding medicines are outlined above.  No orders of the defined types were placed in this encounter.  No orders of the defined types were placed in this encounter.   Patient Instructions  Medication Instructions:  No change *If you need a refill on your cardiac medications before your next appointment, please call your pharmacy*   Lab Work: None If you have labs (blood work) drawn today and your tests are completely normal, you will receive your results only by: Marland Kitchen MyChart Message (if you have MyChart) OR . A paper copy in the mail If you have any lab test that is abnormal or we need to change your treatment, we will call you to review the results.   Testing/Procedures: None   Follow-Up: At [facility], you and your health needs are our priority.  As part of our continuing mission to provide you with exceptional heart care, we have created designated Provider Care Teams.  These Care Teams include your primary Cardiologist (physician) and Advanced Practice Providers (APPs -  Physician Assistants and Nurse Practitioners) who all work together to provide you with the care you need, when you need it.  We recommend signing up for the patient portal called "MyChart".  Sign up information is provided on this After Visit Summary.  MyChart is used to connect with patients for Virtual Visits (Telemedicine).  Patients are able to view lab/test results, encounter notes, upcoming appointments, etc.  Non-urgent messages can be sent to your provider as well.   To learn more about what you can do with MyChart, go to [email or web address].    Your next appointment:   1 year  The format for your next  appointment:   In Person  Provider:   Buford Dresser, MD    Signed, Buford Dresser, MD PhD 09/18/2020  [place]

## 2020-09-21 NOTE — Addendum Note (Signed)
Addended by: Merri Ray A on: 09/21/2020 03:20 PM   Modules accepted: Orders

## 2020-09-25 DIAGNOSIS — J449 Chronic obstructive pulmonary disease, unspecified: Secondary | ICD-10-CM | POA: Diagnosis not present

## 2020-09-26 DIAGNOSIS — J449 Chronic obstructive pulmonary disease, unspecified: Secondary | ICD-10-CM | POA: Diagnosis not present

## 2020-10-01 DIAGNOSIS — E2839 Other primary ovarian failure: Secondary | ICD-10-CM

## 2020-10-01 DIAGNOSIS — Z1231 Encounter for screening mammogram for malignant neoplasm of breast: Secondary | ICD-10-CM

## 2020-10-11 ENCOUNTER — Ambulatory Visit: Payer: Medicare PPO | Admitting: Internal Medicine

## 2020-10-26 DIAGNOSIS — J449 Chronic obstructive pulmonary disease, unspecified: Secondary | ICD-10-CM | POA: Diagnosis not present

## 2020-11-25 DIAGNOSIS — J449 Chronic obstructive pulmonary disease, unspecified: Secondary | ICD-10-CM | POA: Diagnosis not present

## 2020-11-28 ENCOUNTER — Other Ambulatory Visit: Payer: Self-pay

## 2020-11-28 ENCOUNTER — Telehealth (INDEPENDENT_AMBULATORY_CARE_PROVIDER_SITE_OTHER): Payer: Medicare PPO | Admitting: Psychiatry

## 2020-11-28 DIAGNOSIS — F32A Depression, unspecified: Secondary | ICD-10-CM | POA: Diagnosis not present

## 2020-11-28 DIAGNOSIS — F324 Major depressive disorder, single episode, in partial remission: Secondary | ICD-10-CM

## 2020-11-28 DIAGNOSIS — F419 Anxiety disorder, unspecified: Secondary | ICD-10-CM | POA: Diagnosis not present

## 2020-11-28 MED ORDER — BUPROPION HCL ER (XL) 150 MG PO TB24: ORAL_TABLET | ORAL | 1 refills | Status: DC

## 2020-11-28 MED ORDER — LORAZEPAM 0.5 MG PO TABS: ORAL_TABLET | ORAL | 3 refills | Status: DC

## 2020-11-28 MED ORDER — BUSPIRONE HCL 15 MG PO TABS
ORAL_TABLET | ORAL | 1 refills | Status: DC
Start: 2020-11-28 — End: 2021-01-30

## 2020-11-28 NOTE — Progress Notes (Signed)
Psychiatric Initial Adult Assessment   Patient Identification: Amanda Porter MRN:  [id number] Date of Evaluation:  11/28/2020 Referral Source: Devonne Doughty primary care Chief Complaint:   Visit Diagnosis: Adjustment disorder with anxiety  History of Present Illness: Anxiety    Today the patient is only doing fair.  The patient says everything is well by her daughter who is on the phone actually since things are not well.  The patient is not functioning nearly as well.  She seems according to her daughter to be more depressed.  She does not cry the patient denies being depressed but apparently she is not doing as many things that she enjoys.  She is choosing television shows that her toxic is negative.  The different symptoms are subtle.  Actually says she has more anxiety.  From actually supportive human mood is different.  She is sleeping and eating okay has reasonable amount of energy.  The patient is oxygen dependent.  She does exercise on her bike.  The patient is not psychotic.  She is not suicidal. nic Symptoms:   Anxiety Symptoms:  Excessive Worry, Psychotic Symptoms:   PTSD Symptoms:   Past Psychiatric History: Multiple antidepressants, SSRIs Remeron  Previous Psychotropic Medications:   Substance Abuse History in the last 12 months:    Consequences of Substance Abuse:   Past Medical History:  Past Medical History:  Diagnosis Date   Anxiety    Anxiety and depression    COPD (chronic obstructive pulmonary disease) (HCC)    CVA (cerebral vascular accident) ([place])    Dementia ([place])    Depression    Diabetes mellitus without complication ([place])    Dysphasia    History of lung cancer    Hyperlipidemia    Memory disturbance 11/20/2017   Normal pressure hydrocephalus ([place]) 2018   Retinal detachment    Right   Right carotid bruit 11/20/2017   Tardive dyskinesia 02/01/2020   Thrombosis    Arterial to lower extremity?    Past Surgical History:  Procedure  Laterality Date   LUNG REMOVAL, PARTIAL     left upper lobe   VENTRICULOPERITONEAL SHUNT  12/09/2016    Family Psychiatric History:   Family History:  Family History  Problem Relation Age of Onset   Pneumonia Mother    Cancer Father    Diabetes Sister    Diabetes Sister    Breast cancer Neg Hx     Social History:   Social History   Socioeconomic History   Marital status: Divorced    Spouse name: Not on file   Number of children: 1   Years of education: Not on file   Highest education level: Not on file  Occupational History   Occupation: Retired  Tobacco Use   Smoking status: Former Smoker    Packs/day: 0.50    Years: 50.00    Pack years: 25.00    Quit date: 08/01/2017    Years since quitting: 3.3   Smokeless tobacco: Never Used  Vaping Use   Vaping Use: Never used  Substance and Sexual Activity   Alcohol use: No   Drug use: No   Sexual activity: Not Currently  Other Topics Concern   Not on file  Social History Narrative   Moved from [place].   Lives with daughter.   Right handed   Social Determinants of Health   Financial Resource Strain: Not on file  Food Insecurity: Not on file  Transportation Needs: Not on file  Physical Activity: Not on file  Stress: Not on file  Social Connections: Not on file    Additional Social History:   Allergies:   Allergies  Allergen Reactions   Lexapro [Escitalopram Oxalate] Other (See Comments)    Dizziness, nausea, fatigue   Neosporin  [Neomycin-Polymyxin-Gramicidin] Rash    (Neosporin)    Metabolic Disorder Labs: Lab Results  Component Value Date   HGBA1C 6.7 (H) 04/23/2020   No results found for: PROLACTIN Lab Results  Component Value Date   CHOL 144 04/23/2020   TRIG 86.0 04/23/2020   HDL 47.60 04/23/2020   CHOLHDL 3 04/23/2020   VLDL 17.2 04/23/2020   LDLCALC 80 04/23/2020   LDLCALC 82 01/17/2020   No results found for: TSH  Therapeutic Level Labs: No results found for:  LITHIUM No results found for: CBMZ No results found for: VALPROATE  Current Medications: Current Outpatient Medications  Medication Sig Dispense Refill   albuterol (PROVENTIL) (2.5 MG/3ML) 0.083% nebulizer solution USE 1 VIAL IN NEBULIZER EVERY 6 HOURS - and as needed 75 mL 11   albuterol (VENTOLIN HFA) 108 (90 Base) MCG/ACT inhaler INHALE 2 PUFFS BY MOUTH EVERY 4 HOURS AS NEEDED 18 g 0   alendronate (FOSAMAX) 70 MG tablet TAKE 1 TABLET BY MOUTH ONCE WEEKLY ON AN EMPTY STOMACH WITH WATER ONLY. DO NOT LAY FLAT FOR ONE HOUR 12 tablet 3   buPROPion (WELLBUTRIN XL) 150 MG 24 hr tablet 3  qam 270 tablet 1   busPIRone (BUSPAR) 15 MG tablet TAKE 2 TABLETS BY MOUTH IN THE MORNING AND TAKE 2 TABLETS BY MOUTH IN THE EVENING 360 tablet 1   clopidogrel (PLAVIX) 75 MG tablet TAKE 1 TABLET BY MOUTH EVERY DAY 90 tablet 1   Ferrous Sulfate (IRON PO) Take by mouth.     glipiZIDE (GLUCOTROL XL) 2.5 MG 24 hr tablet Take 1 tablet (2.5 mg total) by mouth daily with breakfast. For diabetes. 90 tablet 3   LORazepam (ATIVAN) 0.5 MG tablet 1  q am 30 tablet 3   Multiple Vitamin (MULTIVITAMIN) capsule Take 1 capsule by mouth daily.     OXYGEN 3 L continuous.      rosuvastatin (CRESTOR) 20 MG tablet Take 1 tablet (20 mg total) by mouth every evening. For cholesterol. 90 tablet 3   Tiotropium Bromide-Olodaterol (STIOLTO RESPIMAT) 2.5-2.5 MCG/ACT AERS Inhale 2 puffs into the lungs daily. 4 g 12   No current facility-administered medications for this visit.    Musculoskeletal: Strength & Muscle Tone: decreased Gait & Station: normal Patient leans: N/A  Psychiatric Specialty Exam: ROS  There were no vitals taken for this visit.There is no height or weight on file to calculate BMI.  General Appearance: Casual  Eye Contact:  Good  Speech:  Clear and Coherent  Volume:  Normal  Mood:  Anxious  Affect:  Appropriate  Thought Process:  Coherent  Orientation:  NA  Thought Content:  Logical  Suicidal  Thoughts:  No  Homicidal Thoughts:  No  Memory:  Negative  Judgement:  Good  Insight:  Fair  Psychomotor Activity:  Normal  Concentration:    Recall:  [place] of Knowledge:Fair  Language: Fair  Akathisia:  No  Handed:  Right  AIMS (if indicated):  not done  Assets:  Desire for Improvement  ADL's:  Intact  Cognition: Impaired,  Mild  Sleep:  Good   Screenings: GAD-7   Flowsheet Row Office Visit from 02/18/2018 in [place] at [facility] Visit from 11/26/2017 in [place] at [facility]  Total GAD-7 Score 15 [place] Office Visit from 07/02/2020 in [facility] Office Visit from 05/24/2019 in [facility] Office Visit from 07/30/2018 in [facility] Office Visit from 11/20/2017 in [facility]  Total Score (max 30 points ) 25 23 25 27     PHQ2-9   Flowsheet Row Nutrition from 03/26/2018 in [facility] from 03/02/2018 in [facility]  PHQ-2 Total Score 6 6  PHQ-9 Total Score 19 18      Assessment and Plan: \ 12/29/20212:33 PM   The first problems patient seems to have is major depression.  According to her daughter she is worse.  According to the patient she does not feel all that much different.  Today we will go ahead and increase her Wellbutrin to the maximum dose of 450 mg.  Her second problem is adjustment disorder with an anxious mood state.  The patient will continue taking BuSpar.  Her next visit if her anxiety is as bad as it was today we will go ahead and increase the BuSpar further.  She also takes a small dose of Ativan 0.5 mg at night.  The patient generally seems to be fairly stable but we will respect the daughter's observations that the patient seems different.  That she does not enjoy things as much.

## 2020-12-03 NOTE — Telephone Encounter (Signed)
Please verify that information on POA is accurate and that patient does not want to make changes. Reasonable to send her a copy for her personal use.

## 2020-12-04 NOTE — Telephone Encounter (Signed)
Agree with Dr. Einar [place].

## 2020-12-12 NOTE — Progress Notes (Signed)
MRN# [id number] Eddith Mentor August 10, 1946 HPI- [name] - F former smoker followed for COPD, Chronic hypoxic respiratory failure, complicated by dementia/ TIAs/ Normal Pressure Hydrocephalus/ VP shunt 12/09/16,  LULobectomy for lung Ca 2000, Dysphagia solids/ liquids, Hx DVT/PE/ L femoral embolectomy 1998, DM2 **Desat walk 09/30/17; baseline sat was 95% and HR 83. After walking 360 feet sat was 86% and HR 120, added oxygen at 2L and sat increased to 92%  HST 06/23/2019- AHI 3.9/ hr, desat on room air to 83% with mean sat 89%, body weight 114 lbs.   Recommended continue current sleep O2 2.5L. PFT 04/16/20-Severe obstruction (F/F 0.53, FEV1 43%), Severe restriction (TLC 40%), Severe DLCO deficit (33%) ---------------------------------------------------------------------------------  01/30/20- 73 yoF former smoker followed for COPD, Chronic hypoxic respiratory failure, complicated by dementia/ TIAs/ Normal Pressure Hydrocephalus/ VP shunt 12/09/16, Balance Disorder, LULobectomy for lung Ca 2000, Dysphagia solids/ liquids, Hx DVT/PE/ L femoral embolectomy 1998, DM2 O2 3L continuous and POC/ APS Spiriva handihaler, neb albuterol, albuterol hfa -----f/u COPD. O2 3l/min Body weight today 116 lbs Had flu vax and 1 Covid Phizer                   Daughter here( EMT) Nebulizer and O2 are helpful. Has wheeze but no cough.  Sleeps head elevated. Breathing doesn't wake her.  Being evaluated for chronic dizziness/ risk of falls. CXR 06/28/19- IMPRESSION: 1. Stable surgical changes and probable radiation changes involving the left hilum related to a prior left upper lobe lobectomy. 2. No findings suspicious for recurrent tumor or metastatic pulmonary lesions.  12/13/20- 73 yoF former smoker followed for COPD, Chronic hypoxic respiratory failure, complicated by dementia/ TIAs/ Normal Pressure Hydrocephalus/ VP shunt 12/09/16, Balance Disorder, LULobectomy for lung Ca 2000, Dysphagia solids/ liquids, Hx  DVT/PE/ L femoral embolectomy 1998, DM2 O2 3L continuous and POC/ APS/ [employer] Stiolto, neb albuterol, albuterol hfa Followed by Neurology and Psychiatry                       Daughter here Covid vax-3 Phizer Flu vax- today Feels fairly stable, using nebulizer 2-3x/ day, rescue hfa only if away from house.  No acute episodes. PFT 04/16/20-Severe obstruction (F/F 0.53, FEV1 43%), Severe restriction (TLC 40%), Severe DLCO deficit (33%) CXR 04/16/20-  IMPRESSION: 1. No acute cardiopulmonary disease. 2. Stable changes of COPD and prior left upper lobectomy.   ROS-see HPI   + = positive Constitutional:    weight loss, night sweats, fevers, chills, fatigue, lassitude. HEENT:    headaches, difficulty swallowing, tooth/dental problems, sore throat,       sneezing, itching, ear ache, nasal congestion, post nasal drip, snoring CV:    chest pain, orthopnea, PND, swelling in lower extremities, anasarca,                                   dizziness, palpitations Resp:   +shortness of breath with exertion or at rest.                productive cough,   non-productive cough, coughing up of blood.              change in color of mucus.  +wheezing.   Skin:    rash or lesions. GI:  No-   heartburn, +indigestion, abdominal pain, nausea, vomiting, diarrhea,                 change  in bowel habits, loss of appetite GU: dysuria, change in color of urine, no urgency or frequency.   flank pain. MS:  + joint pain, stiffness, decreased range of motion, back pain. Neuro-     + HPI   Dizzy/ balance problem Psych:  change in mood or affect.  +depression or +anxiety.   memory loss.  OBJ- Physical Exam General- Alert, Oriented, Affect-appropriate, Distress- none acute, O2 2L pulse Skin- rash-none, lesions- none, excoriation- none Lymphadenopathy- none Head- atraumatic            Eyes- Gross vision intact, PERRLA, conjunctivae and secretions clear            Ears- Hearing, canals-normal            Nose- Clear,  no-Septal dev, mucus, polyps, erosion, perforation             Throat- Mallampati II , mucosa clear , drainage- none, tonsils- atrophic Neck- flexible , trachea midline, no stridor , thyroid nl, carotid no bruit Chest - symmetrical excursion , unlabored           Heart/CV- RRR , no murmur , no gallop  , no rub, nl s1 s2                           - JVD- none , edema- none, stasis changes- none, varices- none           Lung- clear to P&A, wheeze- none, cough- none , dullness-none, rub- none           Chest wall-  Abd-  Br/ Gen/ Rectal- Not done, not indicated Extrem- cyanosis- none, clubbing, none, atrophy- none, strength- nl. + wheelchair Neuro- grossly intact to observation

## 2020-12-13 ENCOUNTER — Ambulatory Visit (INDEPENDENT_AMBULATORY_CARE_PROVIDER_SITE_OTHER): Payer: Medicare PPO | Admitting: Internal Medicine

## 2020-12-13 ENCOUNTER — Other Ambulatory Visit: Payer: Self-pay

## 2020-12-13 ENCOUNTER — Encounter: Payer: Self-pay | Admitting: Internal Medicine

## 2020-12-13 VITALS — BP 124/60 | HR 90 | Temp 97.4°F | Ht 62.0 in | Wt 116.8 lb

## 2020-12-13 DIAGNOSIS — Z23 Encounter for immunization: Secondary | ICD-10-CM

## 2020-12-13 DIAGNOSIS — J449 Chronic obstructive pulmonary disease, unspecified: Secondary | ICD-10-CM

## 2020-12-13 DIAGNOSIS — J9611 Chronic respiratory failure with hypoxia: Secondary | ICD-10-CM | POA: Diagnosis not present

## 2020-12-13 MED ORDER — STIOLTO RESPIMAT 2.5-2.5 MCG/ACT IN AERS: 2.0000 | INHALATION_SPRAY | Freq: Every day | RESPIRATORY_TRACT | 12 refills | Status: DC

## 2020-12-13 MED ORDER — ALBUTEROL SULFATE HFA 108 (90 BASE) MCG/ACT IN AERS: INHALATION_SPRAY | RESPIRATORY_TRACT | 12 refills | Status: DC

## 2020-12-13 NOTE — Patient Instructions (Signed)
Continue oxygen  We refilled Stiolto and Ventolin (albuterol) rescue inhaler  Order- Flu vax- senior

## 2020-12-15 ENCOUNTER — Other Ambulatory Visit: Payer: Self-pay | Admitting: Primary Care

## 2020-12-15 DIAGNOSIS — E119 Type 2 diabetes mellitus without complications: Secondary | ICD-10-CM

## 2020-12-18 NOTE — Telephone Encounter (Signed)
Did we complete this paperwork? Do I need to sign?

## 2020-12-24 NOTE — Telephone Encounter (Signed)
I have not received any paperwork for this patient.

## 2020-12-26 DIAGNOSIS — J449 Chronic obstructive pulmonary disease, unspecified: Secondary | ICD-10-CM | POA: Diagnosis not present

## 2020-12-27 NOTE — Telephone Encounter (Signed)
Called daughter in regards to [facility] paperwork. LVM to either refax or call us with any questions/ concerns.

## 2020-12-28 NOTE — Telephone Encounter (Signed)
Called patient and notified daughter to refax FMLA paperwork as we did not receive. Will refax. Provided fax number

## 2020-12-31 NOTE — Telephone Encounter (Signed)
Received paperwork. Filled and placed in PCP's inbox for review and signature.

## 2020-12-31 NOTE — Telephone Encounter (Signed)
FMLA paperwork signed, placed on Alice's desk for faxing.

## 2021-01-01 NOTE — Telephone Encounter (Signed)
Paperwork faxed. Pt notified.   Copy for scan Copy for patient

## 2021-01-02 ENCOUNTER — Other Ambulatory Visit: Payer: Self-pay | Admitting: Primary Care

## 2021-01-02 DIAGNOSIS — E119 Type 2 diabetes mellitus without complications: Secondary | ICD-10-CM

## 2021-01-03 NOTE — Telephone Encounter (Signed)
Pharmacy requests refill on: Glipizide ER 2.5 mg   LAST REFILL: 12/17/2020 (Q-30, R-0) LAST OV: 01/24/2020 NEXT OV: Not Scheduled  PHARMACY: [facility] [phone] in [place], [place]

## 2021-01-04 NOTE — Telephone Encounter (Signed)
Called patient to schedule cpe. LVM to call back.

## 2021-01-07 ENCOUNTER — Encounter: Payer: Self-pay | Admitting: Primary Care

## 2021-01-07 NOTE — Telephone Encounter (Signed)
Called patient to schedule CPE. LVM to call back. Letter sent.

## 2021-01-24 ENCOUNTER — Encounter: Payer: Self-pay | Admitting: Internal Medicine

## 2021-01-24 NOTE — Assessment & Plan Note (Signed)
Continues to need O2 3L sleep and exertion

## 2021-01-24 NOTE — Assessment & Plan Note (Signed)
Severe without acute exacerbation.  Plan- continue present meds, Flu vax

## 2021-01-26 DIAGNOSIS — J449 Chronic obstructive pulmonary disease, unspecified: Secondary | ICD-10-CM | POA: Diagnosis not present

## 2021-01-29 ENCOUNTER — Other Ambulatory Visit: Payer: Self-pay | Admitting: Primary Care

## 2021-01-29 DIAGNOSIS — E119 Type 2 diabetes mellitus without complications: Secondary | ICD-10-CM

## 2021-01-29 NOTE — Telephone Encounter (Signed)
Patient overdue for follow up, needs to be seen for further refills. Please schedule.

## 2021-01-30 ENCOUNTER — Telehealth (INDEPENDENT_AMBULATORY_CARE_PROVIDER_SITE_OTHER): Payer: Medicare PPO | Admitting: Psychiatry

## 2021-01-30 ENCOUNTER — Other Ambulatory Visit: Payer: Self-pay

## 2021-01-30 DIAGNOSIS — F32A Depression, unspecified: Secondary | ICD-10-CM

## 2021-01-30 DIAGNOSIS — F419 Anxiety disorder, unspecified: Secondary | ICD-10-CM | POA: Diagnosis not present

## 2021-01-30 DIAGNOSIS — F324 Major depressive disorder, single episode, in partial remission: Secondary | ICD-10-CM | POA: Diagnosis not present

## 2021-01-30 MED ORDER — LORAZEPAM 0.5 MG PO TABS: ORAL_TABLET | ORAL | 3 refills | Status: DC

## 2021-01-30 MED ORDER — BUPROPION HCL ER (XL) 150 MG PO TB24: ORAL_TABLET | ORAL | 1 refills | Status: DC

## 2021-01-30 MED ORDER — BUSPIRONE HCL 15 MG PO TABS: ORAL_TABLET | ORAL | 1 refills | Status: DC

## 2021-01-30 NOTE — Progress Notes (Signed)
Psychiatric Initial Adult Assessment   Patient Identification: Amanda Porter MRN:  [id number] Date of Evaluation:  01/30/2021 Referral Source: Devonne Doughty primary care Chief Complaint:   Visit Diagnosis: Adjustment disorder with anxiety  History of Present Illness: Anxiety   Today the patient is doing well.  She is interviewed with her daughter.  Generally the patient is happy.  Her daughter thinks she is not doing so well but the patient disagrees.  Daughter thinks she is just wasting around with a sitting of doing nothing.  She says she is in the middle being anxious and depressed but the patient denies this.  The issue is the patient's not being activated.  Today we give him the telephone number to [facility].  Patient takes her Wellbutrin and BuSpar as ordered.  Generally she is sleeping and eating well.  She is oxygen dependent and with a walker.  She denies chest pain shortness of breath or any new neurological symptoms.  She has no psychosis.  Patient is functioning only fairly well.  She is very inactive.  She watches a lot of TV.  Her anxiety level overall seems to be better.  This is a difference to what her daughter thinks.  I strongly recommended that they contact [facility] to do it on site visit. nic Symptoms:   Anxiety Symptoms:  Excessive Worry, Psychotic Symptoms:   PTSD Symptoms:   Past Psychiatric History: Multiple antidepressants, SSRIs Remeron  Previous Psychotropic Medications:   Substance Abuse History in the last 12 months:    Consequences of Substance Abuse:   Past Medical History:  Past Medical History:  Diagnosis Date  . Anxiety   . Anxiety and depression   . COPD (chronic obstructive pulmonary disease) ([place])   . CVA (cerebral vascular accident) ([place])   . Dementia ([place])   . Depression   . Diabetes mellitus without complication ([place])   . Dysphasia   . History of lung cancer   . Hyperlipidemia   . Memory disturbance 11/20/2017  .  Normal pressure hydrocephalus ([place]) 2018  . Retinal detachment    Right  . Right carotid bruit 11/20/2017  . Tardive dyskinesia 02/01/2020  . Thrombosis    Arterial to lower extremity?    Past Surgical History:  Procedure Laterality Date  . LUNG REMOVAL, PARTIAL     left upper lobe  . VENTRICULOPERITONEAL SHUNT  12/09/2016    Family Psychiatric History:   Family History:  Family History  Problem Relation Age of Onset  . Pneumonia Mother   . Cancer Father   . Diabetes Sister   . Diabetes Sister   . Breast cancer Neg Hx     Social History:   Social History   Socioeconomic History  . Marital status: Divorced    Spouse name: Not on file  . Number of children: 1  . Years of education: Not on file  . Highest education level: Not on file  Occupational History  . Occupation: Retired  Tobacco Use  . Smoking status: Former Smoker    Packs/day: 0.50    Years: 50.00    Pack years: 25.00    Quit date: 08/01/2017    Years since quitting: 3.5  . Smokeless tobacco: Never Used  Vaping Use  . Vaping Use: Never used  Substance and Sexual Activity  . Alcohol use: No  . Drug use: No  . Sexual activity: Not Currently  Other Topics Concern  . Not on file  Social History Narrative   Moved from  [place].   Lives with daughter.   Right handed   Social Determinants of Health   Financial Resource Strain: Not on file  Food Insecurity: Not on file  Transportation Needs: Not on file  Physical Activity: Not on file  Stress: Not on file  Social Connections: Not on file    Additional Social History:   Allergies:   Allergies  Allergen Reactions  . Lexapro [Escitalopram Oxalate] Other (See Comments)    Dizziness, nausea, fatigue  . Neosporin  [Neomycin-Polymyxin-Gramicidin] Rash    (Neosporin)    Metabolic Disorder Labs: Lab Results  Component Value Date   HGBA1C 6.7 (H) 04/23/2020   No results found for: PROLACTIN Lab Results  Component Value Date   CHOL 144 04/23/2020    TRIG 86.0 04/23/2020   HDL 47.60 04/23/2020   CHOLHDL 3 04/23/2020   VLDL 17.2 04/23/2020   [place] 80 04/23/2020   LDLCALC 82 01/17/2020   No results found for: TSH  Therapeutic Level Labs: No results found for: LITHIUM No results found for: CBMZ No results found for: VALPROATE  Current Medications: Current Outpatient Medications  Medication Sig Dispense Refill  . albuterol (PROVENTIL) (2.5 MG/3ML) 0.083% nebulizer solution USE 1 VIAL IN NEBULIZER EVERY 6 HOURS - and as needed 75 mL 11  . albuterol (VENTOLIN HFA) 108 (90 Base) MCG/ACT inhaler INHALE 2 PUFFS BY MOUTH EVERY 4 HOURS AS NEEDED 18 g 12  . alendronate (FOSAMAX) 70 MG tablet TAKE 1 TABLET BY MOUTH ONCE WEEKLY ON AN EMPTY STOMACH WITH WATER ONLY. DO NOT LAY FLAT FOR ONE HOUR 12 tablet 3  . buPROPion (WELLBUTRIN XL) 150 MG 24 hr tablet 3  qam 270 tablet 1  . busPIRone (BUSPAR) 15 MG tablet TAKE 2 TABLETS BY MOUTH IN THE MORNING AND TAKE 2 TABLETS BY MOUTH IN THE EVENING 360 tablet 1  . clopidogrel (PLAVIX) 75 MG tablet TAKE 1 TABLET BY MOUTH EVERY DAY 90 tablet 1  . Ferrous Sulfate (IRON PO) Take by mouth.    [place] glipiZIDE (GLUCOTROL XL) 2.5 MG 24 hr tablet TAKE 1 TABLET (2.5 MG TOTAL) BY MOUTH DAILY WITH BREAKFAST. FOR DIABETES. 30 tablet 0  . LORazepam (ATIVAN) 0.5 MG tablet 1  q am 30 tablet 3  . Multiple Vitamin (MULTIVITAMIN) capsule Take 1 capsule by mouth daily.    . OXYGEN 3 L continuous.     . rosuvastatin (CRESTOR) 20 MG tablet Take 1 tablet (20 mg total) by mouth every evening. For cholesterol. 90 tablet 3  . Tiotropium Bromide-Olodaterol (STIOLTO RESPIMAT) 2.5-2.5 MCG/ACT AERS Inhale 2 puffs into the lungs daily. 4 g 12   No current facility-administered medications for this visit.    Musculoskeletal: Strength & Muscle Tone: decreased Gait & Station: normal Patient leans: N/A  Psychiatric Specialty Exam: ROS  There were no vitals taken for this visit.There is no height or weight on file to calculate BMI.   General Appearance: Casual  Eye Contact:  Good  Speech:  Clear and Coherent  Volume:  Normal  Mood:  Anxious  Affect:  Appropriate  Thought Process:  Coherent  Orientation:  NA  Thought Content:  Logical  Suicidal Thoughts:  No  Homicidal Thoughts:  No  Memory:  Negative  Judgement:  Good  Insight:  Fair  Psychomotor Activity:  Normal  Concentration:    Recall:  [place] of Knowledge:Fair  Language: Fair  Akathisia:  No  Handed:  Right  AIMS (if indicated):  not done  Assets:  Desire for Improvement  ADL's:  Intact  Cognition: Impaired,  Mild  Sleep:  Good   Screenings: GAD-7   Flowsheet Row Office Visit from 02/18/2018 in [place] at [facility] Visit from 11/26/2017 in [place] at [facility]  Total GAD-7 Score 15 [place] Office Visit from 07/02/2020 in [facility] Office Visit from 05/24/2019 in [facility] Office Visit from 07/30/2018 in [facility] Office Visit from 11/20/2017 in [facility]  Total Score (max 30 points ) 25 23 25 27     PHQ2-9   Flowsheet Row Nutrition from 03/26/2018 in [facility] from 03/02/2018 in [facility]  PHQ-2 Total Score 6 6  PHQ-9 Total Score 19 18      Assessment and Plan: \   This patient has 2 problems.  First is that of major depression.  She takes the maximum dose of Wellbutrin and does well.  She is not anhedonic and she denies being depressed despite the fact that her daughter observes her to be doing very little and being an active.  Her second problem is that of an anxiety disorder specifically adjustment disorder with an anxious mood state.  She does very well with BuSpar and with a small dose of Ativan.  This patient be seen ideally in 3 months in person.  I believe she is stable at this time.

## 2021-01-30 NOTE — Telephone Encounter (Signed)
Called patient's daughter to schedule follow up. Patient's daughter stated she will call back to schedule.

## 2021-02-12 ENCOUNTER — Other Ambulatory Visit: Payer: Self-pay | Admitting: Primary Care

## 2021-02-12 DIAGNOSIS — E785 Hyperlipidemia, unspecified: Secondary | ICD-10-CM

## 2021-02-23 DIAGNOSIS — J449 Chronic obstructive pulmonary disease, unspecified: Secondary | ICD-10-CM | POA: Diagnosis not present

## 2021-03-06 ENCOUNTER — Other Ambulatory Visit: Payer: Self-pay | Admitting: Primary Care

## 2021-03-06 DIAGNOSIS — E119 Type 2 diabetes mellitus without complications: Secondary | ICD-10-CM

## 2021-03-08 ENCOUNTER — Other Ambulatory Visit: Payer: Self-pay | Admitting: Primary Care

## 2021-03-08 DIAGNOSIS — I639 Cerebral infarction, unspecified: Secondary | ICD-10-CM

## 2021-03-14 ENCOUNTER — Ambulatory Visit (INDEPENDENT_AMBULATORY_CARE_PROVIDER_SITE_OTHER): Payer: Medicare PPO | Admitting: Primary Care

## 2021-03-14 ENCOUNTER — Encounter: Payer: Self-pay | Admitting: Primary Care

## 2021-03-14 ENCOUNTER — Other Ambulatory Visit: Payer: Self-pay

## 2021-03-14 VITALS — BP 140/62 | HR 100 | Temp 97.3°F | Ht 62.0 in | Wt 113.0 lb

## 2021-03-14 DIAGNOSIS — F32A Depression, unspecified: Secondary | ICD-10-CM

## 2021-03-14 DIAGNOSIS — Z8673 Personal history of transient ischemic attack (TIA), and cerebral infarction without residual deficits: Secondary | ICD-10-CM

## 2021-03-14 DIAGNOSIS — I10 Essential (primary) hypertension: Secondary | ICD-10-CM

## 2021-03-14 DIAGNOSIS — E785 Hyperlipidemia, unspecified: Secondary | ICD-10-CM

## 2021-03-14 DIAGNOSIS — M8580 Other specified disorders of bone density and structure, unspecified site: Secondary | ICD-10-CM

## 2021-03-14 DIAGNOSIS — D509 Iron deficiency anemia, unspecified: Secondary | ICD-10-CM

## 2021-03-14 DIAGNOSIS — J449 Chronic obstructive pulmonary disease, unspecified: Secondary | ICD-10-CM

## 2021-03-14 DIAGNOSIS — E119 Type 2 diabetes mellitus without complications: Secondary | ICD-10-CM | POA: Diagnosis not present

## 2021-03-14 DIAGNOSIS — Z1211 Encounter for screening for malignant neoplasm of colon: Secondary | ICD-10-CM

## 2021-03-14 DIAGNOSIS — Z0000 Encounter for general adult medical examination without abnormal findings: Secondary | ICD-10-CM

## 2021-03-14 DIAGNOSIS — F419 Anxiety disorder, unspecified: Secondary | ICD-10-CM | POA: Diagnosis not present

## 2021-03-14 DIAGNOSIS — D649 Anemia, unspecified: Secondary | ICD-10-CM | POA: Diagnosis not present

## 2021-03-14 DIAGNOSIS — G912 (Idiopathic) normal pressure hydrocephalus: Secondary | ICD-10-CM | POA: Diagnosis not present

## 2021-03-14 DIAGNOSIS — J9611 Chronic respiratory failure with hypoxia: Secondary | ICD-10-CM

## 2021-03-14 LAB — CBC
HCT: 34.8 % — ABNORMAL LOW (ref 36.0–46.0)
Hemoglobin: 11.1 g/dL — ABNORMAL LOW (ref 12.0–15.0)
MCHC: 31.8 g/dL (ref 30.0–36.0)
MCV: 88.7 fl (ref 78.0–100.0)
Platelets: 279 10*3/uL (ref 150.0–400.0)
RBC: 3.93 Mil/uL (ref 3.87–5.11)
RDW: 15.7 % — ABNORMAL HIGH (ref 11.5–15.5)
WBC: 8.2 10*3/uL (ref 4.0–10.5)

## 2021-03-14 LAB — COMPREHENSIVE METABOLIC PANEL
ALT: 8 U/L (ref 0–35)
AST: 11 U/L (ref 0–37)
Albumin: 3.8 g/dL (ref 3.5–5.2)
Alkaline Phosphatase: 71 U/L (ref 39–117)
BUN: 18 mg/dL (ref 6–23)
CO2: 30 mEq/L (ref 19–32)
Calcium: 9.6 mg/dL (ref 8.4–10.5)
Chloride: 103 mEq/L (ref 96–112)
Creatinine, Ser: 0.79 mg/dL (ref 0.40–1.20)
GFR: 72.86 mL/min (ref >60.00)
Glucose, Bld: 129 mg/dL — ABNORMAL HIGH (ref 70–99)
Potassium: 4.3 mEq/L (ref 3.5–5.1)
Sodium: 142 mEq/L (ref 135–145)
Total Bilirubin: 0.3 mg/dL (ref 0.2–1.2)
Total Protein: 7.1 g/dL (ref 6.0–8.3)

## 2021-03-14 LAB — IBC + FERRITIN
Ferritin: 27.7 ng/mL (ref 10.0–291.0)
Iron: 57 ug/dL (ref 42–145)
Saturation Ratios: 14.3 % — ABNORMAL LOW (ref 20.0–50.0)
Transferrin: 284 mg/dL (ref 212.0–360.0)

## 2021-03-14 LAB — HEMOGLOBIN A1C: Hgb A1c MFr Bld: 7.4 % — ABNORMAL HIGH (ref 4.6–6.5)

## 2021-03-14 LAB — LIPID PANEL
Cholesterol: 130 mg/dL (ref 0–200)
HDL: 53.4 mg/dL (ref >39.00)
LDL Cholesterol: 63 mg/dL (ref 0–99)
NonHDL: 76.73
Total CHOL/HDL Ratio: 2
Triglycerides: 67 mg/dL (ref 0.0–149.0)
VLDL: 13.4 mg/dL (ref 0.0–40.0)

## 2021-03-14 MED ORDER — GLIPIZIDE ER 2.5 MG PO TB24: 2.5000 mg | ORAL_TABLET | Freq: Every day | ORAL | 3 refills | Status: DC

## 2021-03-14 MED ORDER — ALENDRONATE SODIUM 70 MG PO TABS
ORAL_TABLET | ORAL | 3 refills | Status: DC
Start: 2021-03-14 — End: 2022-04-23

## 2021-03-14 NOTE — Assessment & Plan Note (Signed)
No follow up since May 2021, discussed the importance of regular follow up at least every 6 months.  Compliant to Glipizide XL 2.5 mg, continue same. Repeat A1C pending.  Urine microalbumin pending. Managed on statin. Eye exam UTD per patient.   Follow up in 6 months.

## 2021-03-14 NOTE — Assessment & Plan Note (Signed)
Repeat labs pending

## 2021-03-14 NOTE — Assessment & Plan Note (Signed)
Follows with pulmonology, compliant to [employer] daily. Occasional use of nebulizer during seasonal changes.

## 2021-03-14 NOTE — Assessment & Plan Note (Signed)
Compliant to clopidogrel 75 mg and rosuvastatin 20 mg, continue same.   Exam today stable. No new symptoms.

## 2021-03-14 NOTE — Assessment & Plan Note (Signed)
Following with pulmonology, compliant to continuous oxygen. Compliant to Stiolto daily, occasional use of nebulizer during seasonal changes.   Continue to monitor.

## 2021-03-14 NOTE — Progress Notes (Signed)
Subjective:    Patient ID: Amanda Porter, female    DOB: 1946/11/24, 75 y.o.   MRN: [id number]  HPI  Amanda Porter is a very pleasant 75 y.o. female who presents today for complete physical and follow up of chronic medical conditions.  Immunizations: -Tetanus: 2020 -Influenza: Completed this season  -Covid-19: Three vaccines -Shingles: Declines, never had chicken pox as a child  -Pneumonia: Prevnar in 2020, pneumovax in 2009   Diet:  She endorses a fair diet. Exercise: No regular exercise  Eye exam: Completed last year Dental exam: No recent exam  Mammogram: Scheduled Dexa: Scheduled  Colonoscopy: 2018, due in 2021?  BP Readings from Last 3 Encounters:  03/14/21 140/62  12/13/20 124/60  09/18/20 134/80      Review of Systems  Constitutional: Negative for unexpected weight change.  HENT: Negative for rhinorrhea.   Respiratory: Positive for shortness of breath.   Cardiovascular: Negative for chest pain.  Gastrointestinal: Negative for constipation and diarrhea.  Genitourinary: Negative for difficulty urinating.  Musculoskeletal: Negative for arthralgias.  Skin: Negative for rash.  Allergic/Immunologic: Negative for environmental allergies.  Neurological: Positive for headaches. Negative for dizziness and numbness.  Psychiatric/Behavioral: The patient is nervous/anxious.          Past Medical History:  Diagnosis Date  . Anxiety   . Anxiety and depression   . COPD (chronic obstructive pulmonary disease) ([place])   . CVA (cerebral vascular accident) ([place])   . Dementia ([place])   . Depression   . Diabetes mellitus without complication ([place])   . Dysphasia   . History of lung cancer   . Hyperlipidemia   . Memory disturbance 11/20/2017  . Normal pressure hydrocephalus ([place]) 2018  . Retinal detachment    Right  . Right carotid bruit 11/20/2017  . Tardive dyskinesia 02/01/2020  . Thrombosis    Arterial to lower extremity?    Social History    Socioeconomic History  . Marital status: Divorced    Spouse name: Not on file  . Number of children: 1  . Years of education: Not on file  . Highest education level: Not on file  Occupational History  . Occupation: Retired  Tobacco Use  . Smoking status: Former Smoker    Packs/day: 0.50    Years: 50.00    Pack years: 25.00    Quit date: 08/01/2017    Years since quitting: 3.6  . Smokeless tobacco: Never Used  Vaping Use  . Vaping Use: Never used  Substance and Sexual Activity  . Alcohol use: No  . Drug use: No  . Sexual activity: Not Currently  Other Topics Concern  . Not on file  Social History Narrative   Moved from [place].   Lives with daughter.   Right handed   Social Determinants of Health   Financial Resource Strain: Not on file  Food Insecurity: Not on file  Transportation Needs: Not on file  Physical Activity: Not on file  Stress: Not on file  Social Connections: Not on file  Intimate Partner Violence: Not on file    Past Surgical History:  Procedure Laterality Date  . LUNG REMOVAL, PARTIAL     left upper lobe  . VENTRICULOPERITONEAL SHUNT  12/09/2016    Family History  Problem Relation Age of Onset  . Pneumonia Mother   . Cancer Father   . Diabetes Sister   . Diabetes Sister   . Breast cancer Neg Hx     Allergies  Allergen Reactions  . Lexapro [  Escitalopram Oxalate] Other (See Comments)    Dizziness, nausea, fatigue  . Neosporin  [Neomycin-Polymyxin-Gramicidin] Rash    (Neosporin)    Current Outpatient Medications on File Prior to Visit  Medication Sig Dispense Refill  . albuterol (PROVENTIL) (2.5 MG/3ML) 0.083% nebulizer solution USE 1 VIAL IN NEBULIZER EVERY 6 HOURS - and as needed 75 mL 11  . albuterol (VENTOLIN HFA) 108 (90 Base) MCG/ACT inhaler INHALE 2 PUFFS BY MOUTH EVERY 4 HOURS AS NEEDED 18 g 12  . buPROPion (WELLBUTRIN XL) 150 MG 24 hr tablet 3  qam 270 tablet 1  . busPIRone (BUSPAR) 15 MG tablet TAKE 2 TABLETS BY MOUTH IN THE  MORNING AND TAKE 2 TABLETS BY MOUTH IN THE EVENING 360 tablet 1  . clopidogrel (PLAVIX) 75 MG tablet TAKE 1 TABLET BY MOUTH EVERY DAY 30 tablet 0  . Ferrous Sulfate (IRON PO) Take by mouth.    Marland Kitchen glipiZIDE (GLUCOTROL XL) 2.5 MG 24 hr tablet TAKE 1 TABLET (2.5 MG TOTAL) BY MOUTH DAILY WITH BREAKFAST. FOR DIABETES. 30 tablet 0  . LORazepam (ATIVAN) 0.5 MG tablet 1  q am 30 tablet 3  . Multiple Vitamin (MULTIVITAMIN) capsule Take 1 capsule by mouth daily.    . OXYGEN 3 L continuous.     . rosuvastatin (CRESTOR) 20 MG tablet TAKE 1 TABLET (20 MG TOTAL) BY MOUTH EVERY EVENING. FOR CHOLESTEROL. 30 tablet 0  . Tiotropium Bromide-Olodaterol (STIOLTO RESPIMAT) 2.5-2.5 MCG/ACT AERS Inhale 2 puffs into the lungs daily. 4 g 12  . alendronate (FOSAMAX) 70 MG tablet TAKE 1 TABLET BY MOUTH ONCE WEEKLY ON AN EMPTY STOMACH WITH WATER ONLY. DO NOT LAY FLAT FOR ONE HOUR (Patient not taking: Reported on 03/14/2021) 12 tablet 3   No current facility-administered medications on file prior to visit.    BP 140/62   Pulse 100   Temp (!) 97.3 F (36.3 C) (Temporal)   Ht 5\' 2"  (1.575 m)   Wt 113 lb (51.3 kg)   SpO2 94% Comment: 3 L of o2  BMI 20.67 kg/m  Objective:   Physical Exam HENT:     Right Ear: Tympanic membrane and ear canal normal.     Left Ear: Tympanic membrane and ear canal normal.     Nose: Nose normal.  Eyes:     Conjunctiva/sclera: Conjunctivae normal.     Pupils: Pupils are equal, round, and reactive to light.  Neck:     Thyroid: No thyromegaly.  Cardiovascular:     Rate and Rhythm: Normal rate and regular rhythm.     Heart sounds: No murmur heard.   Pulmonary:     Effort: Pulmonary effort is normal.     Breath sounds: Normal breath sounds. No rales.  Abdominal:     General: Bowel sounds are normal.     Palpations: Abdomen is soft.     Tenderness: There is no abdominal tenderness.  Musculoskeletal:        General: Normal range of motion.     Cervical back: Neck supple.   Lymphadenopathy:     Cervical: No cervical adenopathy.  Skin:    General: Skin is warm and dry.     Findings: No rash.  Neurological:     Mental Status: She is alert and oriented to person, place, and time.     Cranial Nerves: No cranial nerve deficit.     Deep Tendon Reflexes: Reflexes are normal and symmetric.  Psychiatric:        Mood and Affect:  Mood normal.           Assessment & Plan:      This visit occurred during the SARS-CoV-2 public health emergency.  Safety protocols were in place, including screening questions prior to the visit, additional usage of staff PPE, and extensive cleaning of exam room while observing appropriate contact time as indicated for disinfecting solutions.

## 2021-03-14 NOTE — Assessment & Plan Note (Signed)
Overall stable given age, normal reading in January 2022. Continue to monitor.   Labs pending.

## 2021-03-14 NOTE — Assessment & Plan Note (Signed)
Compliant to Crestor 20 mg daily, continue same. Repeat lipid panel pending.

## 2021-03-14 NOTE — Assessment & Plan Note (Signed)
Waxes and wanes, follows with psychiatry, refuses to see therapy despite recommendations.  Continue Buspar 30 mg BID and Wellbutrin XL 450 mg daily, PRN lorazepam.

## 2021-03-14 NOTE — Assessment & Plan Note (Signed)
Immunizations UTD. Mammogram and bone density scan scheduled. Colonoscopy appears to be due according to care everywhere, referral placed to GI.   Discussed the importance of a healthy diet and regular exercise in order for weight loss, and to reduce the risk of any potential medical problems.  Exam today stable Labs pending.

## 2021-03-14 NOTE — Patient Instructions (Signed)
Stop by the lab prior to leaving today. I will notify you of your results once received.   You will be contacted regarding your referral to GI for the colonoscopy.  Please let us know if you have not been contacted within two weeks.   Please schedule a follow up appointment in 6 months for diabetes check.  It was a pleasure to see you today!   Preventive Care 78 Years and Older, Female Preventive care refers to lifestyle choices and visits with your health care provider that can promote health and wellness. This includes:  A yearly physical exam. This is also called an annual wellness visit.  Regular dental and eye exams.  Immunizations.  Screening for certain conditions.  Healthy lifestyle choices, such as: ? Eating a healthy diet. ? Getting regular exercise. ? Not using drugs or products that contain nicotine and tobacco. ? Limiting alcohol use. What can I expect for my preventive care visit? Physical exam Your health care provider will check your:  Height and weight. These may be used to calculate your BMI (body mass index). BMI is a measurement that tells if you are at a healthy weight.  Heart rate and blood pressure.  Body temperature.  Skin for abnormal spots. Counseling Your health care provider may ask you questions about your:  Past medical problems.  Family's medical history.  Alcohol, tobacco, and drug use.  Emotional well-being.  Home life and relationship well-being.  Sexual activity.  Diet, exercise, and sleep habits.  History of falls.  Memory and ability to understand (cognition).  Work and work Statistician.  Pregnancy and menstrual history.  Access to firearms. What immunizations do I need? Vaccines are usually given at various ages, according to a schedule. Your health care provider will recommend vaccines for you based on your age, medical history, and lifestyle or other factors, such as travel or where you work.   What tests do I  need? Blood tests  Lipid and cholesterol levels. These may be checked every 5 years, or more often depending on your overall health.  Hepatitis C test.  Hepatitis B test. Screening  Lung cancer screening. You may have this screening every year starting at age 65 if you have a 30-pack-year history of smoking and currently smoke or have quit within the past 15 years.  Colorectal cancer screening. ? All adults should have this screening starting at age 4 and continuing until age 24. ? Your health care provider may recommend screening at age 63 if you are at increased risk. ? You will have tests every 1-10 years, depending on your results and the type of screening test.  Diabetes screening. ? This is done by checking your blood sugar (glucose) after you have not eaten for a while (fasting). ? You may have this done every 1-3 years.  Mammogram. ? This may be done every 1-2 years. ? Talk with your health care provider about how often you should have regular mammograms.  Abdominal aortic aneurysm (AAA) screening. You may need this if you are a current or former smoker.  BRCA-related cancer screening. This may be done if you have a family history of breast, ovarian, tubal, or peritoneal cancers. Other tests  STD (sexually transmitted disease) testing, if you are at risk.  Bone density scan. This is done to screen for osteoporosis. You may have this done starting at age 24. Talk with your health care provider about your test results, treatment options, and if necessary, the need for more  tests. Follow these instructions at home: Eating and drinking  Eat a diet that includes fresh fruits and vegetables, whole grains, lean protein, and low-fat dairy products. Limit your intake of foods with high amounts of sugar, saturated fats, and salt.  Take vitamin and mineral supplements as recommended by your health care provider.  Do not drink alcohol if your health care provider tells you not  to drink.  If you drink alcohol: ? Limit how much you have to 0-1 drink a day. ? Be aware of how much alcohol is in your drink. In the [place] one drink equals one 12 oz bottle of beer (355 mL), one 5 oz glass of wine (148 mL), or one 1 oz glass of hard liquor (44 mL).   Lifestyle  Take daily care of your teeth and gums. Brush your teeth every morning and night with fluoride toothpaste. Floss one time each day.  Stay active. Exercise for at least 30 minutes 5 or more days each week.  Do not use any products that contain nicotine or tobacco, such as cigarettes, e-cigarettes, and chewing tobacco. If you need help quitting, ask your health care provider.  Do not use drugs.  If you are sexually active, practice safe sex. Use a condom or other form of protection in order to prevent STIs (sexually transmitted infections).  Talk with your health care provider about taking a low-dose aspirin or statin.  Find healthy ways to cope with stress, such as: ? Meditation, yoga, or listening to music. ? Journaling. ? Talking to a trusted person. ? Spending time with friends and family. Safety  Always wear your seat belt while driving or riding in a vehicle.  Do not drive: ? If you have been drinking alcohol. Do not ride with someone who has been drinking. ? When you are tired or distracted. ? While texting.  Wear a helmet and other protective equipment during sports activities.  If you have firearms in your house, make sure you follow all gun safety procedures. What's next?  Visit your health care provider once a year for an annual wellness visit.  Ask your health care provider how often you should have your eyes and teeth checked.  Stay up to date on all vaccines. This information is not intended to replace advice given to you by your health care provider. Make sure you discuss any questions you have with your health care provider. Document Revised: 11/07/2020 Document Reviewed:  11/11/2018 Elsevier Patient Education  2021 [employer].

## 2021-03-14 NOTE — Assessment & Plan Note (Signed)
Refusing to follow up with neurology, overall appears stable. Continue to monitor.

## 2021-03-14 NOTE — Addendum Note (Signed)
Addended by: Ellamae Sia on: 03/14/2021 08:47 AM   Modules accepted: Orders

## 2021-03-14 NOTE — Assessment & Plan Note (Signed)
Compliant to ferrous sulfate 325 mg daily. Repeat iron labs pending.

## 2021-03-26 DIAGNOSIS — J449 Chronic obstructive pulmonary disease, unspecified: Secondary | ICD-10-CM | POA: Diagnosis not present

## 2021-04-08 ENCOUNTER — Other Ambulatory Visit: Payer: Self-pay | Admitting: Primary Care

## 2021-04-08 DIAGNOSIS — E785 Hyperlipidemia, unspecified: Secondary | ICD-10-CM

## 2021-04-08 DIAGNOSIS — I639 Cerebral infarction, unspecified: Secondary | ICD-10-CM

## 2021-04-25 DIAGNOSIS — J449 Chronic obstructive pulmonary disease, unspecified: Secondary | ICD-10-CM | POA: Diagnosis not present

## 2021-04-26 ENCOUNTER — Other Ambulatory Visit: Payer: Medicare PPO

## 2021-04-26 ENCOUNTER — Other Ambulatory Visit: Payer: Self-pay | Admitting: Primary Care

## 2021-04-26 ENCOUNTER — Ambulatory Visit: Payer: Medicare PPO

## 2021-04-26 DIAGNOSIS — E2839 Other primary ovarian failure: Secondary | ICD-10-CM

## 2021-05-01 ENCOUNTER — Other Ambulatory Visit: Payer: Self-pay

## 2021-05-01 ENCOUNTER — Telehealth (INDEPENDENT_AMBULATORY_CARE_PROVIDER_SITE_OTHER): Payer: Medicare PPO | Admitting: Psychiatry

## 2021-05-01 DIAGNOSIS — F324 Major depressive disorder, single episode, in partial remission: Secondary | ICD-10-CM

## 2021-05-01 DIAGNOSIS — F32A Depression, unspecified: Secondary | ICD-10-CM

## 2021-05-01 DIAGNOSIS — F419 Anxiety disorder, unspecified: Secondary | ICD-10-CM

## 2021-05-01 MED ORDER — BUSPIRONE HCL 15 MG PO TABS: ORAL_TABLET | ORAL | 4 refills | Status: DC

## 2021-05-01 MED ORDER — LORAZEPAM 0.5 MG PO TABS: ORAL_TABLET | ORAL | 4 refills | Status: DC

## 2021-05-01 MED ORDER — BUPROPION HCL ER (XL) 150 MG PO TB24: ORAL_TABLET | ORAL | 1 refills | Status: DC

## 2021-05-01 NOTE — Progress Notes (Signed)
Psychiatric Initial Adult Assessment   Patient Identification: Amanda Porter MRN:  [id number] Date of Evaluation:  05/01/2021 Referral Source: [facility] primary care Chief Complaint:   Visit Diagnosis: Adjustment disorder with anxiety  History of Present Illness: Anxiety   Today the patient is interviewed with her daughter [name].  [name] lives with her.  Today for the first time the patient acknowledges that maybe she is not so great.  She is in fact anxious.  She is not sure what is making her anxious.  She is vague about whether her anxiety gets in the way.  Her daughter is quick to jump and say it does get in the way.  Unfortunately the daughter and the patient did not make an attempt to call [facility] about evaluating what could be done for this patient in terms of a day treatment program or other services.  They will [facility] and talk to them about these options.  The patient takes her medicines just as prescribed.  She is eating okay.  The patient is oxygen dependent.  She is wanting water.  I am not clear if she minimizes things but she does not appear in all that much distress.  I think her daughter actually is more concerned than the patient seems to be.  Unfortunately the patient has not been seen in person in well over a year.  Her next visit will be in person.  Today her interventions will be to increase her BuSpar taking 3 in the morning and 2 at night.  We will move her Ativan from the nighttime to the morning or earlier in the day major oversedated and sleepy.  Therefore we will continue the Ativan just at night.  She will continue taking her antidepressant as prescribed patient is not psychotic.  She drinks no alcohol and uses no drugs. nic Symptoms:   Anxiety Symptoms:  Excessive Worry, Psychotic Symptoms:   PTSD Symptoms:   Past Psychiatric History: Multiple antidepressants, SSRIs Remeron  Previous Psychotropic Medications:   Substance Abuse History in  the last 12 months:    Consequences of Substance Abuse:   Past Medical History:  Past Medical History:  Diagnosis Date  . Anxiety   . Anxiety and depression   . COPD (chronic obstructive pulmonary disease) ([place])   . CVA (cerebral vascular accident) ([place])   . Dementia ([place])   . Depression   . Diabetes mellitus without complication ([place])   . Dysphasia   . History of lung cancer   . Hyperlipidemia   . Memory disturbance 11/20/2017  . Normal pressure hydrocephalus ([place]) 2018  . Retinal detachment    Right  . Right carotid bruit 11/20/2017  . Tardive dyskinesia 02/01/2020  . Thrombosis    Arterial to lower extremity?    Past Surgical History:  Procedure Laterality Date  . LUNG REMOVAL, PARTIAL     left upper lobe  . VENTRICULOPERITONEAL SHUNT  12/09/2016    Family Psychiatric History:   Family History:  Family History  Problem Relation Age of Onset  . Pneumonia Mother   . Cancer Father   . Diabetes Sister   . Diabetes Sister   . Breast cancer Neg Hx     Social History:   Social History   Socioeconomic History  . Marital status: Divorced    Spouse name: Not on file  . Number of children: 1  . Years of education: Not on file  . Highest education level: Not on file  Occupational History  .  Occupation: Retired  Tobacco Use  . Smoking status: Former Smoker    Packs/day: 0.50    Years: 50.00    Pack years: 25.00    Quit date: 08/01/2017    Years since quitting: 3.7  . Smokeless tobacco: Never Used  Vaping Use  . Vaping Use: Never used  Substance and Sexual Activity  . Alcohol use: No  . Drug use: No  . Sexual activity: Not Currently  Other Topics Concern  . Not on file  Social History Narrative   Moved from [place].   Lives with daughter.   Right handed   Social Determinants of Health   Financial Resource Strain: Not on file  Food Insecurity: Not on file  Transportation Needs: Not on file  Physical Activity: Not on file  Stress: Not on file  Social  Connections: Not on file    Additional Social History:   Allergies:   Allergies  Allergen Reactions  . Lexapro [Escitalopram Oxalate] Other (See Comments)    Dizziness, nausea, fatigue  . Neosporin  [Neomycin-Polymyxin-Gramicidin] Rash    (Neosporin)    Metabolic Disorder Labs: Lab Results  Component Value Date   HGBA1C 7.4 (H) 03/14/2021   No results found for: PROLACTIN Lab Results  Component Value Date   CHOL 130 03/14/2021   TRIG 67.0 03/14/2021   HDL 53.40 03/14/2021   CHOLHDL 2 03/14/2021   VLDL 13.4 03/14/2021   LDLCALC 63 03/14/2021   [place] 80 04/23/2020   No results found for: TSH  Therapeutic Level Labs: No results found for: LITHIUM No results found for: CBMZ No results found for: VALPROATE  Current Medications: Current Outpatient Medications  Medication Sig Dispense Refill  . albuterol (PROVENTIL) (2.5 MG/3ML) 0.083% nebulizer solution USE 1 VIAL IN NEBULIZER EVERY 6 HOURS - and as needed 75 mL 11  . albuterol (VENTOLIN HFA) 108 (90 Base) MCG/ACT inhaler INHALE 2 PUFFS BY MOUTH EVERY 4 HOURS AS NEEDED 18 g 12  . alendronate (FOSAMAX) 70 MG tablet Take with a full glass of water on an empty stomach for bone density. 12 tablet 3  . buPROPion (WELLBUTRIN XL) 150 MG 24 hr tablet 3  qam 270 tablet 1  . busPIRone (BUSPAR) 15 MG tablet 3  qam   2 qpm 150 tablet 4  . clopidogrel (PLAVIX) 75 MG tablet TAKE 1 TABLET BY MOUTH EVERY DAY 90 tablet 3  . Ferrous Sulfate (IRON PO) Take by mouth.    Marland Kitchen glipiZIDE (GLUCOTROL XL) 2.5 MG 24 hr tablet Take 1 tablet (2.5 mg total) by mouth daily with breakfast. For diabetes. 90 tablet 3  . LORazepam (ATIVAN) 0.5 MG tablet 1  q am 30 tablet 4  . Multiple Vitamin (MULTIVITAMIN) capsule Take 1 capsule by mouth daily.    . OXYGEN 3 L continuous.     . rosuvastatin (CRESTOR) 20 MG tablet TAKE 1 TABLET (20 MG TOTAL) BY MOUTH EVERY EVENING. FOR CHOLESTEROL. 90 tablet 3  . Tiotropium Bromide-Olodaterol (STIOLTO RESPIMAT) 2.5-2.5  MCG/ACT AERS Inhale 2 puffs into the lungs daily. 4 g 12   No current facility-administered medications for this visit.    Musculoskeletal: Strength & Muscle Tone: decreased Gait & Station: normal Patient leans: N/A  Psychiatric Specialty Exam: ROS  There were no vitals taken for this visit.There is no height or weight on file to calculate BMI.  General Appearance: Casual  Eye Contact:  Good  Speech:  Clear and Coherent  Volume:  Normal  Mood:  Anxious  Affect:  Appropriate  Thought Process:  Coherent  Orientation:  NA  Thought Content:  Logical  Suicidal Thoughts:  No  Homicidal Thoughts:  No  Memory:  Negative  Judgement:  Good  Insight:  Fair  Psychomotor Activity:  Normal  Concentration:    Recall:  [place] of Knowledge:Fair  Language: Fair  Akathisia:  No  Handed:  Right  AIMS (if indicated):  not done  Assets:  Desire for Improvement  ADL's:  Intact  Cognition: Impaired,  Mild  Sleep:  Good   Screenings: GAD-7   Flowsheet Row Office Visit from 02/18/2018 in [place] at [facility] Visit from 11/26/2017 in [place] at [facility]  Total GAD-7 Score 15 [place] Office Visit from 07/02/2020 in [facility] Office Visit from 05/24/2019 in [facility] Office Visit from 07/30/2018 in [facility] Office Visit from 11/20/2017 in [facility]  Total Score (max 30 points ) 25 23 25 27     PHQ2-9   [place] Visit from 03/14/2021 in [place] [facility] from 03/26/2018 in [facility] from 03/02/2018 in [facility]  PHQ-2 Total Score 6 6 6   PHQ-9 Total Score 9 19 18       Assessment and Plan: \   This patient has 2 problems.  First is major depression.  She will continue taking Wellbutrin as prescribed.  Her second problem is that of an adjustment disorder  with an anxious mood state.  Today we will increase her BuSpar taking a 15 mg pill 3 in the morning and 2 at night which will be the maximum dose will go to.  Patient continue taking a low-dose of Ativan at night.  Importantly the family will contact [facility] to doing one site visit and consider other possible therapy opportunities.  This patient will be seen in this office in 3 months.

## 2021-05-26 DIAGNOSIS — J449 Chronic obstructive pulmonary disease, unspecified: Secondary | ICD-10-CM | POA: Diagnosis not present

## 2021-05-31 ENCOUNTER — Other Ambulatory Visit: Payer: Self-pay | Admitting: Neurology

## 2021-05-31 DIAGNOSIS — G912 (Idiopathic) normal pressure hydrocephalus: Secondary | ICD-10-CM

## 2021-06-02 ENCOUNTER — Other Ambulatory Visit (HOSPITAL_COMMUNITY): Payer: Self-pay | Admitting: Psychiatry

## 2021-06-02 DIAGNOSIS — F32A Depression, unspecified: Secondary | ICD-10-CM

## 2021-06-03 ENCOUNTER — Other Ambulatory Visit: Payer: Self-pay | Admitting: Neurology

## 2021-06-03 DIAGNOSIS — G912 (Idiopathic) normal pressure hydrocephalus: Secondary | ICD-10-CM

## 2021-06-04 NOTE — Telephone Encounter (Signed)
Faxed referral to [facility]. Phone: [phone]. Fax: [phone].

## 2021-06-05 NOTE — Telephone Encounter (Signed)
Received approval from [facility] for CT head wo contrast. PA #[id number] (06/05/21- 07/05/21). Sent order to GI, they will call patient to schedule.

## 2021-06-11 NOTE — Progress Notes (Signed)
MRN# [id number] Amanda Porter [birth year] HPI- Amanda Porter - F former smoker followed for COPD, Chronic hypoxic respiratory failure, complicated by dementia/ TIAs/ Normal Pressure Hydrocephalus/ VP shunt 12/09/16,  LULobectomy for lung Ca 2000, Dysphagia solids/ liquids, Hx DVT/PE/ L femoral embolectomy 1998, DM2 **Desat walk 09/30/17; baseline sat was 95% and HR 83. After walking 360 feet sat was 86% and HR 120, added oxygen at 2L and sat increased to 92%  HST 06/23/2019- AHI 3.9/ hr, desat on room air to 83% with mean sat 89%, body weight 114 lbs.   Recommended continue current sleep O2 2.5L. PFT 04/16/20-Severe obstruction (F/F 0.53, FEV1 43%), Severe restriction (TLC 40%), Severe DLCO deficit (33%) ---------------------------------------------------------------------------------   12/13/20- 73 yoF former smoker followed for COPD, Chronic hypoxic respiratory failure, complicated by dementia/ TIAs/ Normal Pressure Hydrocephalus/ VP shunt 12/09/16, Balance Disorder, LULobectomy for lung Ca 2000, Dysphagia solids/ liquids, Hx DVT/PE/ L femoral embolectomy 1998, DM2 O2 3L continuous and POC/ APS/ [employer] Stiolto, neb albuterol, albuterol hfa Followed by Neurology and Psychiatry                       Daughter here Covid vax-3 Phizer Flu vax- today Feels fairly stable, using nebulizer 2-3x/ day, rescue hfa only if away from house.  No acute episodes. PFT 04/16/20-Severe obstruction (F/F 0.53, FEV1 43%), Severe restriction (TLC 40%), Severe DLCO deficit (33%) CXR 04/16/20-  IMPRESSION: 1. No acute cardiopulmonary disease. 2. Stable changes of COPD and prior left upper lobectomy.  06/12/21- 75 yoF former smoker (25 pk yrs) followed for COPD, Chronic hypoxic respiratory failure, complicated by dementia/ TIAs/ Normal Pressure Hydrocephalus/ VP shunt 12/09/16, Balance Disorder, Depression, LULobectomy for lung Ca 2000, Dysphagia solids/ liquids, Hx DVT/PE/ L femoral embolectomy 1998, DM2 O2 3L  continuous and POC/ APS/ [employer] -Stiolto 2.5, neb albuterol, albuterol hfa Followed by Neurology and Psychiatry                       Daughter here Covid vax- ------Patient feels good overall, feels like breathing is the same since last visit. Wears 3 liters all the time. She has trouble coordinating to operate [employer]. Discussed alternatives.  ROS-see HPI   + = positive Constitutional:    weight loss, night sweats, fevers, chills, fatigue, lassitude. HEENT:    headaches, difficulty swallowing, tooth/dental problems, sore throat,       sneezing, itching, ear ache, nasal congestion, post nasal drip, snoring CV:    chest pain, orthopnea, PND, swelling in lower extremities, anasarca,                                   dizziness, palpitations Resp:   +shortness of breath with exertion or at rest.                productive cough,   non-productive cough, coughing up of blood.              change in color of mucus.  +wheezing.   Skin:    rash or lesions. GI:  No-   heartburn, +indigestion, abdominal pain, nausea, vomiting, diarrhea,                 change in bowel habits, loss of appetite GU: dysuria, change in color of urine, no urgency or frequency.   flank pain. MS:  + joint pain, stiffness, decreased range of motion, back pain. Neuro-     +  HPI   Dizzy/ balance problem Psych:  change in mood or affect.  +depression or +anxiety.   memory loss.  OBJ- Physical Exam General- Alert, Oriented, Affect-appropriate, Distress- none acute, O2 3L pulse Skin- rash-none, lesions- none, excoriation- none Lymphadenopathy- none Head- atraumatic            Eyes- Gross vision intact, PERRLA, conjunctivae and secretions clear            Ears- Hearing, canals-normal            Nose- Clear, no-Septal dev, mucus, polyps, erosion, perforation             Throat- Mallampati II , mucosa clear , drainage- none, tonsils- atrophic Neck- flexible , trachea midline, no stridor , thyroid nl, carotid no bruit Chest -  symmetrical excursion , unlabored           Heart/CV- RRR , no murmur , no gallop  , no rub, nl s1 s2                           - JVD- none , edema- none, stasis changes- none, varices- none           Lung- clear to P&A, wheeze- none, cough- none , dullness-none, rub- none           Chest wall-  Abd-  Br/ Gen/ Rectal- Not done, not indicated Extrem- cyanosis- none, clubbing, none, atrophy- none, strength- nl. + wheelchair Neuro- grossly intact to observation

## 2021-06-12 ENCOUNTER — Encounter: Payer: Self-pay | Admitting: Internal Medicine

## 2021-06-12 ENCOUNTER — Ambulatory Visit: Payer: Medicare PPO | Admitting: Internal Medicine

## 2021-06-12 ENCOUNTER — Other Ambulatory Visit: Payer: Self-pay

## 2021-06-12 ENCOUNTER — Ambulatory Visit (INDEPENDENT_AMBULATORY_CARE_PROVIDER_SITE_OTHER): Payer: Medicare PPO

## 2021-06-12 DIAGNOSIS — J449 Chronic obstructive pulmonary disease, unspecified: Secondary | ICD-10-CM | POA: Diagnosis not present

## 2021-06-12 DIAGNOSIS — F0390 Unspecified dementia without behavioral disturbance: Secondary | ICD-10-CM

## 2021-06-12 MED ORDER — BEVESPI AEROSPHERE 9-4.8 MCG/ACT IN AERO: INHALATION_SPRAY | RESPIRATORY_TRACT | 12 refills | Status: DC

## 2021-06-12 MED ORDER — ALBUTEROL SULFATE HFA 108 (90 BASE) MCG/ACT IN AERS
INHALATION_SPRAY | RESPIRATORY_TRACT | 12 refills | Status: DC
Start: 2021-06-12 — End: 2022-07-11

## 2021-06-12 MED ORDER — STIOLTO RESPIMAT 2.5-2.5 MCG/ACT IN AERS: 2.0000 | INHALATION_SPRAY | Freq: Every day | RESPIRATORY_TRACT | 0 refills | Status: DC

## 2021-06-12 NOTE — Patient Instructions (Signed)
Order- CXR  dx COPD mixed type  Script sent refilling Ventolin rescue inhaler to inhale 2 puffs, up to 4 times daily, if needed  Sample Stiolto 2.5 mg  Script sent to try Bevespi inhaler  inhale 2 puffs twice daily, every day.   This would be instead of Stiolto if you like it better. Do not usee both Stiolto and Bevespi- one or the other.  If you decide to stay with Stiolto, please let us know so we can reorder it.

## 2021-06-24 ENCOUNTER — Other Ambulatory Visit: Payer: Self-pay

## 2021-06-24 ENCOUNTER — Ambulatory Visit
Admission: RE | Admit: 2021-06-24 | Discharge: 2021-06-24 | Disposition: A | Discharge status: Home / Self Care | Payer: Medicare PPO | Source: Ambulatory Visit | Attending: Primary Care | Admitting: Primary Care

## 2021-06-24 DIAGNOSIS — Z1231 Encounter for screening mammogram for malignant neoplasm of breast: Secondary | ICD-10-CM | POA: Diagnosis not present

## 2021-06-25 DIAGNOSIS — J449 Chronic obstructive pulmonary disease, unspecified: Secondary | ICD-10-CM | POA: Diagnosis not present

## 2021-07-03 ENCOUNTER — Ambulatory Visit: Payer: Medicare PPO | Admitting: Neurology

## 2021-07-03 ENCOUNTER — Encounter: Payer: Self-pay | Admitting: Neurology

## 2021-07-03 ENCOUNTER — Other Ambulatory Visit: Payer: Self-pay

## 2021-07-03 VITALS — BP 152/80 | HR 88 | Ht 62.0 in | Wt 115.8 lb

## 2021-07-03 DIAGNOSIS — R413 Other amnesia: Secondary | ICD-10-CM | POA: Diagnosis not present

## 2021-07-03 DIAGNOSIS — G912 (Idiopathic) normal pressure hydrocephalus: Secondary | ICD-10-CM | POA: Diagnosis not present

## 2021-07-03 NOTE — Progress Notes (Signed)
Reason for visit: Normal pressure hydrocephalus, memory disorder, gait disorder  Amanda Porter is an 75 y.o. female  History of present illness:  Amanda Porter is a 75 year old right-handed black female with a history of normal pressure sulcus, status post VP shunt.  The patient has a memory disturbance, in the past she has not tolerated Aricept or Namenda.  She has chronic sensations of dizziness and pressure sensations in the head.  The pressure sensation seems to be worse in the morning and better as the day goes on.  It is not clear whether the sensations of dizziness parallel this or not.  She comes in with a family member who believes that her walking has changed over the last several months, she is shuffling more.  The patient walks with a walker.  She is on an oxygen concentrator.  She does have some shortness of breath with minimal activity.  She has been noted to have some worsening urinary incontinence as well.  A CT scan of the brain has been set up but has not yet been done.  The patient claims that she is sleeping fairly well.  In the past, physical therapy has helped her walking.  She is not always good about using the walker at all times.  Past Medical History:  Diagnosis Date   Anxiety    Anxiety and depression    COPD (chronic obstructive pulmonary disease) ([place])    CVA (cerebral vascular accident) ([place])    Dementia ([place])    Depression    Diabetes mellitus without complication ([place])    Dysphasia    History of lung cancer    Hyperlipidemia    Memory disturbance 11/20/2017   Normal pressure hydrocephalus ([place]) 2018   Retinal detachment    Right   Right carotid bruit 11/20/2017   Tardive dyskinesia 02/01/2020   Thrombosis    Arterial to lower extremity?    Past Surgical History:  Procedure Laterality Date   LUNG REMOVAL, PARTIAL     left upper lobe   VENTRICULOPERITONEAL SHUNT  12/09/2016    Family History  Problem Relation Age of Onset   Pneumonia Mother     Cancer Father    Diabetes Sister    Diabetes Sister    Breast cancer Neg Hx     Social history:  reports that she quit smoking about 3 years ago. Her smoking use included cigarettes. She has a 25.00 pack-year smoking history. She has never used smokeless tobacco. She reports that she does not drink alcohol and does not use drugs.    Allergies  Allergen Reactions   Lexapro [Escitalopram Oxalate] Other (See Comments)    Dizziness, nausea, fatigue   Neosporin  [Neomycin-Polymyxin-Gramicidin] Rash    (Neosporin)    Medications:  Prior to Admission medications   Medication Sig Start Date End Date Taking? Authorizing Provider  albuterol (PROVENTIL) (2.5 MG/3ML) 0.083% nebulizer solution USE 1 VIAL IN NEBULIZER EVERY 6 HOURS - and as needed 12/26/19  Yes Young, Clinton D, MD  albuterol (VENTOLIN HFA) 108 (90 Base) MCG/ACT inhaler INHALE 2 PUFFS BY MOUTH EVERY 4 HOURS AS NEEDED 06/12/21  Yes Young, Tarri Fuller D, MD  alendronate (FOSAMAX) 70 MG tablet Take with a full glass of water on an empty stomach for bone density. 03/14/21  Yes Pleas Koch, NP  buPROPion (WELLBUTRIN XL) 150 MG 24 hr tablet 3  qam 05/01/21  Yes Plovsky, Berneta Sages, MD  busPIRone (BUSPAR) 15 MG tablet 3  qam  2 qpm 05/01/21  Yes Plovsky, Berneta Sages, MD  clopidogrel (PLAVIX) 75 MG tablet TAKE 1 TABLET BY MOUTH EVERY DAY 04/09/21  Yes Pleas Koch, NP  Ferrous Sulfate (IRON PO) Take by mouth.   Yes [provider]  glipiZIDE (GLUCOTROL XL) 2.5 MG 24 hr tablet Take 1 tablet (2.5 mg total) by mouth daily with breakfast. For diabetes. 03/14/21  Yes Pleas Koch, NP  LORazepam (ATIVAN) 0.5 MG tablet 1  q am 05/01/21  Yes Plovsky, Berneta Sages, MD  Multiple Vitamin (MULTIVITAMIN) capsule Take 1 capsule by mouth daily.   Yes [provider]  OXYGEN 3 L continuous.    Yes [provider]  rosuvastatin (CRESTOR) 20 MG tablet TAKE 1 TABLET (20 MG TOTAL) BY MOUTH EVERY EVENING. FOR CHOLESTEROL. 04/09/21  Yes Pleas Koch, NP  Tiotropium Bromide-Olodaterol (STIOLTO RESPIMAT) 2.5-2.5 MCG/ACT AERS Inhale 2 puffs into the lungs daily. 06/12/21  Yes Young, Tarri Fuller D, MD  Glycopyrrolate-Formoterol (BEVESPI AEROSPHERE) 9-4.8 MCG/ACT AERO Inhale 2 puffs, twice daily Patient not taking: Reported on 07/03/2021 06/12/21   Deneise Lever, MD    ROS:  Out of a complete 14 system review of symptoms, the patient complains only of the following symptoms, and all other reviewed systems are negative.  Walking difficulty Memory problems Urinary incontinence   Blood pressure (!) 152/80, pulse 88, height 5\' 2"  (1.575 m), weight 115 lb 12.8 oz (52.5 kg).  Physical Exam  General: The patient is alert and cooperative at the time of the examination.  Skin: No significant peripheral edema is noted.   Neurologic Exam  Mental status: The patient is alert and oriented x 3 at the time of the examination. The Mini-Mental status examination done today shows a total score of 20/30.  The patient is able to name 9 four-legged animals in 60 seconds.   Cranial nerves: Facial symmetry is present. Speech is normal, no aphasia or dysarthria is noted. Extraocular movements are full. Visual fields are full.  Motor: The patient has good strength in all 4 extremities.  Sensory examination: Soft touch sensation is symmetric on the face, arms, and legs.  Coordination: The patient has good finger-nose-finger and heel-to-shin bilaterally.  Gait and station: The patient is able to ambulate with a walker.  She appears to have good stride and good turns with a walker, good stability.  Romberg is positive, she has tendency to fall backwards.  Tandem gait was not attempted.  Reflexes: Deep tendon reflexes are symmetric.   Assessment/Plan:  1.  Normal pressure hydrocephalus  2.  Gait disorder  3.  Memory disorder  The patient will be set up for CT scan of the brain, it appears she has an appointment to see neurosurgery in the  next 2 weeks.  The patient will follow up here in about 6 months.  In the future, she can be seen through Dr. Brett Fairy.  The caretaker indicates that the walking is changed, currently the clinical examination today shows good stability in her ability to ambulate.  Jill Alexanders MD 07/03/2021 3:34 PM  [facility] [address] [place], [place] [address]  Phone [phone] Fax [phone]

## 2021-07-06 ENCOUNTER — Ambulatory Visit
Admission: RE | Admit: 2021-07-06 | Discharge: 2021-07-06 | Disposition: A | Discharge status: Home / Self Care | Payer: Medicare PPO | Source: Ambulatory Visit | Attending: Neurology | Admitting: Neurology

## 2021-07-06 ENCOUNTER — Other Ambulatory Visit: Payer: Self-pay

## 2021-07-06 DIAGNOSIS — G912 (Idiopathic) normal pressure hydrocephalus: Secondary | ICD-10-CM | POA: Diagnosis not present

## 2021-07-08 ENCOUNTER — Telehealth: Payer: Self-pay | Admitting: Neurology

## 2021-07-08 NOTE — Telephone Encounter (Signed)
I called the caretaker.  The CT of the brain shows no change from over a year ago.  The patient will be seen by neurosurgery in the near future for evaluation of some alteration in her walking pattern.   CT head 07/07/21:  IMPRESSION: This CT scan of the head without contrast shows the following: 1.   Right frontal intraventricular shunt. 2.   Moderate chronic ventriculomegaly 3.   Hypodense changes in the hemispheres consistent with moderate chronic microvascular ischemic change. 4.   Chronic lacunar infarctions in the thalamus, right greater than left 5.   No changes compared to the CT scan from 02/15/2020

## 2021-07-19 ENCOUNTER — Other Ambulatory Visit: Payer: Self-pay | Admitting: Internal Medicine

## 2021-07-22 DIAGNOSIS — J449 Chronic obstructive pulmonary disease, unspecified: Secondary | ICD-10-CM | POA: Diagnosis not present

## 2021-07-26 DIAGNOSIS — J449 Chronic obstructive pulmonary disease, unspecified: Secondary | ICD-10-CM | POA: Diagnosis not present

## 2021-07-29 ENCOUNTER — Other Ambulatory Visit: Payer: Medicare PPO

## 2021-08-07 ENCOUNTER — Ambulatory Visit (HOSPITAL_COMMUNITY): Payer: Medicare PPO | Admitting: Psychiatry

## 2021-08-14 DIAGNOSIS — E119 Type 2 diabetes mellitus without complications: Secondary | ICD-10-CM

## 2021-08-14 DIAGNOSIS — J449 Chronic obstructive pulmonary disease, unspecified: Secondary | ICD-10-CM | POA: Diagnosis not present

## 2021-08-14 MED ORDER — BLOOD GLUCOSE MONITOR KIT: PACK | 0 refills | Status: DC

## 2021-08-14 NOTE — Telephone Encounter (Signed)
Printed and placed on your desk.

## 2021-08-14 NOTE — Telephone Encounter (Signed)
Ok to send

## 2021-08-15 ENCOUNTER — Other Ambulatory Visit: Payer: Self-pay

## 2021-08-15 DIAGNOSIS — E119 Type 2 diabetes mellitus without complications: Secondary | ICD-10-CM

## 2021-08-15 MED ORDER — BLOOD GLUCOSE MONITOR KIT: PACK | 0 refills | Status: DC

## 2021-08-15 NOTE — Telephone Encounter (Signed)
Faxed to pharmacy listed in chart.

## 2021-08-20 DIAGNOSIS — G912 (Idiopathic) normal pressure hydrocephalus: Secondary | ICD-10-CM | POA: Diagnosis not present

## 2021-08-20 DIAGNOSIS — Z881 Allergy status to other antibiotic agents status: Secondary | ICD-10-CM | POA: Diagnosis not present

## 2021-08-20 DIAGNOSIS — Z888 Allergy status to other drugs, medicaments and biological substances status: Secondary | ICD-10-CM | POA: Diagnosis not present

## 2021-08-26 DIAGNOSIS — J449 Chronic obstructive pulmonary disease, unspecified: Secondary | ICD-10-CM | POA: Diagnosis not present

## 2021-08-29 ENCOUNTER — Other Ambulatory Visit: Payer: Self-pay | Admitting: Primary Care

## 2021-09-09 DIAGNOSIS — J449 Chronic obstructive pulmonary disease, unspecified: Secondary | ICD-10-CM | POA: Diagnosis not present

## 2021-09-17 ENCOUNTER — Ambulatory Visit: Payer: Medicare PPO | Admitting: Internal Medicine

## 2021-09-18 ENCOUNTER — Ambulatory Visit (HOSPITAL_COMMUNITY): Payer: Medicare PPO | Admitting: Psychiatry

## 2021-09-24 DIAGNOSIS — E119 Type 2 diabetes mellitus without complications: Secondary | ICD-10-CM | POA: Diagnosis not present

## 2021-09-24 LAB — HM DIABETES EYE EXAM

## 2021-09-25 DIAGNOSIS — J449 Chronic obstructive pulmonary disease, unspecified: Secondary | ICD-10-CM | POA: Diagnosis not present

## 2021-10-02 ENCOUNTER — Other Ambulatory Visit: Payer: Self-pay

## 2021-10-02 ENCOUNTER — Encounter: Payer: Self-pay | Admitting: Oncology

## 2021-10-02 ENCOUNTER — Telehealth (HOSPITAL_BASED_OUTPATIENT_CLINIC_OR_DEPARTMENT_OTHER): Payer: Medicare PPO | Admitting: Psychiatry

## 2021-10-02 DIAGNOSIS — F325 Major depressive disorder, single episode, in full remission: Secondary | ICD-10-CM | POA: Diagnosis not present

## 2021-10-02 DIAGNOSIS — F419 Anxiety disorder, unspecified: Secondary | ICD-10-CM

## 2021-10-02 DIAGNOSIS — F32A Depression, unspecified: Secondary | ICD-10-CM | POA: Diagnosis not present

## 2021-10-02 MED ORDER — BUPROPION HCL ER (XL) 150 MG PO TB24: ORAL_TABLET | ORAL | 1 refills | Status: DC

## 2021-10-02 MED ORDER — BUSPIRONE HCL 15 MG PO TABS: ORAL_TABLET | ORAL | 4 refills | Status: DC

## 2021-10-02 MED ORDER — LORAZEPAM 0.5 MG PO TABS: ORAL_TABLET | ORAL | 4 refills | Status: DC

## 2021-10-02 NOTE — Progress Notes (Signed)
Psychiatric Initial Adult Assessment   Patient Identification: Amanda Porter MRN:  [id number] Date of Evaluation:  10/02/2021 Referral Source: [facility] primary care Chief Complaint:   Visit Diagnosis: Adjustment disorder with anxiety  History of Present Illness: Anxiety   Today the patient is interviewed with her daughter Amanda Porter.  Amanda Porter lives with her.  Today for the first time the patient acknowledges that maybe she is not so great.  She is in fact anxious.  She is not sure what is making her anxious.  She is vague about whether her anxiety gets in the way.  Her daughter is quick to jump and say it does get in the way.  Unfortunately the daughter and the patient did not make an attempt to call [facility] about evaluating what could be done for this patient in terms of a day treatment program or other services.  They will [facility] and talk to them about these options.  The patient takes her medicines just as prescribed.  She is eating okay.  The patient is oxygen dependent.  She is wanting water.  I am not clear if she minimizes things but she does not appear in all that much distress.  I think her daughter actually is more concerned than the patient seems to be.  Unfortunately the patient has not been seen in person in well over a year.  Her next visit will be in person.  Today her interventions will be to increase her BuSpar taking 3 in the morning and 2 at night.  We will move her Ativan from the nighttime to the morning or earlier in the day major oversedated and sleepy.  Therefore we will continue the Ativan just at night.  She will continue taking her antidepressant as prescribed patient is not psychotic.  She drinks no alcohol and uses no drugs. nic Symptoms:   Anxiety Symptoms:  Excessive Worry, Psychotic Symptoms:   PTSD Symptoms:   Past Psychiatric History: Multiple antidepressants, SSRIs Remeron  Previous Psychotropic Medications:   Substance Abuse History in the  last 12 months:    Consequences of Substance Abuse:   Past Medical History:  Past Medical History:  Diagnosis Date   Anxiety    Anxiety and depression    COPD (chronic obstructive pulmonary disease) ([place])    CVA (cerebral vascular accident) ([place])    Dementia ([place])    Depression    Diabetes mellitus without complication ([place])    Dysphasia    History of lung cancer    Hyperlipidemia    Memory disturbance 11/20/2017   Normal pressure hydrocephalus ([place]) 2018   Retinal detachment    Right   Right carotid bruit 11/20/2017   Tardive dyskinesia 02/01/2020   Thrombosis    Arterial to lower extremity?    Past Surgical History:  Procedure Laterality Date   LUNG REMOVAL, PARTIAL     left upper lobe   VENTRICULOPERITONEAL SHUNT  12/09/2016    Family Psychiatric History:   Family History:  Family History  Problem Relation Age of Onset   Pneumonia Mother    Cancer Father    Diabetes Sister    Diabetes Sister    Breast cancer Neg Hx     Social History:   Social History   Socioeconomic History   Marital status: Divorced    Spouse name: Not on file   Number of children: 1   Years of education: trade school   Highest education level: Not on file  Occupational History  Occupation: Retired  Tobacco Use   Smoking status: Former    Packs/day: 0.50    Years: 50.00    Pack years: 25.00    Types: Cigarettes    Quit date: 08/01/2017    Years since quitting: 4.1   Smokeless tobacco: Never  Vaping Use   Vaping Use: Never used  Substance and Sexual Activity   Alcohol use: No   Drug use: No   Sexual activity: Not Currently  Other Topics Concern   Not on file  Social History Narrative   Moved from [place].   Lives with daughter.   Right handed   Social Determinants of Health   Financial Resource Strain: Not on file  Food Insecurity: Not on file  Transportation Needs: Not on file  Physical Activity: Not on file  Stress: Not on file  Social Connections: Not on file     Additional Social History:   Allergies:   Allergies  Allergen Reactions   Lexapro [Escitalopram Oxalate] Other (See Comments)    Dizziness, nausea, fatigue   Neosporin  [Neomycin-Polymyxin-Gramicidin] Rash    (Neosporin)    Metabolic Disorder Labs: Lab Results  Component Value Date   HGBA1C 7.4 (H) 03/14/2021   No results found for: PROLACTIN Lab Results  Component Value Date   CHOL 130 03/14/2021   TRIG 67.0 03/14/2021   HDL 53.40 03/14/2021   CHOLHDL 2 03/14/2021   VLDL 13.4 03/14/2021   LDLCALC 63 03/14/2021   LDLCALC 80 04/23/2020   No results found for: TSH  Therapeutic Level Labs: No results found for: LITHIUM No results found for: CBMZ No results found for: VALPROATE  Current Medications: Current Outpatient Medications  Medication Sig Dispense Refill   albuterol (PROVENTIL) (2.5 MG/3ML) 0.083% nebulizer solution USE 1 VIAL IN NEBULIZER EVERY 6 HOURS 360 mL 11   albuterol (VENTOLIN HFA) 108 (90 Base) MCG/ACT inhaler INHALE 2 PUFFS BY MOUTH EVERY 4 HOURS AS NEEDED 18 g 12   alendronate (FOSAMAX) 70 MG tablet Take with a full glass of water on an empty stomach for bone density. 12 tablet 3   blood glucose meter kit and supplies KIT Dispense based on patient and insurance preference. Use up to four times daily as directed. (FOR ICD-9 250.00, 250.01). 1 each 0   buPROPion (WELLBUTRIN XL) 150 MG 24 hr tablet 3  qam 270 tablet 1   busPIRone (BUSPAR) 15 MG tablet 3  qam   2 qpm 150 tablet 4   clopidogrel (PLAVIX) 75 MG tablet TAKE 1 TABLET BY MOUTH EVERY DAY 90 tablet 3   Ferrous Sulfate (IRON PO) Take by mouth.     glipiZIDE (GLUCOTROL XL) 2.5 MG 24 hr tablet Take 1 tablet (2.5 mg total) by mouth daily with breakfast. For diabetes. 90 tablet 3   Glycopyrrolate-Formoterol (BEVESPI AEROSPHERE) 9-4.8 MCG/ACT AERO Inhale 2 puffs, twice daily (Patient not taking: Reported on 07/03/2021) 10.7 g 12   LORazepam (ATIVAN) 0.5 MG tablet 1  q am 30 tablet 4   Multiple Vitamin  (MULTIVITAMIN) capsule Take 1 capsule by mouth daily.     OXYGEN 3 L continuous.      rosuvastatin (CRESTOR) 20 MG tablet TAKE 1 TABLET (20 MG TOTAL) BY MOUTH EVERY EVENING. FOR CHOLESTEROL. 90 tablet 3   Tiotropium Bromide-Olodaterol (STIOLTO RESPIMAT) 2.5-2.5 MCG/ACT AERS Inhale 2 puffs into the lungs daily. 4 g 0   No current facility-administered medications for this visit.    Musculoskeletal: Strength & Muscle Tone: decreased Gait & Station: normal  Patient leans: N/A  Psychiatric Specialty Exam: ROS  There were no vitals taken for this visit.There is no height or weight on file to calculate BMI.  General Appearance: Casual  Eye Contact:  Good  Speech:  Clear and Coherent  Volume:  Normal  Mood:  Anxious  Affect:  Appropriate  Thought Process:  Coherent  Orientation:  NA  Thought Content:  Logical  Suicidal Thoughts:  No  Homicidal Thoughts:  No  Memory:  Negative  Judgement:  Good  Insight:  Fair  Psychomotor Activity:  Normal  Concentration:    Recall:  [place] of Knowledge:Fair  Language: Fair  Akathisia:  No  Handed:  Right  AIMS (if indicated):  not done  Assets:  Desire for Improvement  ADL's:  Intact  Cognition: Impaired,  Mild  Sleep:  Good   Screenings: GAD-7    Flowsheet Row Office Visit from 02/18/2018 in [place] at [facility] Visit from 11/26/2017 in [place] at [facility]  Total GAD-7 Score 15 [place] Office Visit from 07/03/2021 in [facility] Office Visit from 07/02/2020 in [facility] Office Visit from 05/24/2019 in [facility] Office Visit from 07/30/2018 in [facility] Office Visit from 11/20/2017 in [facility]  Total Score (max 30 points ) '20 25 23 25 27      ' PHQ2-9    [place] Visit from 03/14/2021 in [place] [facility] from 03/26/2018 in [facility] from 03/02/2018 in [facility]  PHQ-2 Total Score '6 6 6  ' PHQ-9 Total Score '9 19 18       ' Assessment and Plan: \   This patient has 2 problems.  First is major depression.  She will continue taking Wellbutrin as prescribed.  Her second problem is that of an adjustment disorder with an anxious mood state.  Today we will increase her BuSpar taking a 15 mg pill 3 in the morning and 2 at night which will be the maximum dose will go to.  Patient continue taking a low-dose of Ativan at night.  Importantly the family will contact [facility] to doing one site visit and consider other possible therapy opportunities.  This patient will be seen in this office in 3 months.

## 2021-10-02 NOTE — Progress Notes (Addendum)
Psychiatric Initial Adult Assessment   Patient Identification: Amanda Porter MRN:  [id number] Date of Evaluation:  10/02/2021 Referral Source: [facility] primary care Chief Complaint:   Visit Diagnosis: Adjustment disorder with anxiety  History of Present Illness: Anxiety  Today the patient is doing well.  He spent over the phone with her daughter [name].  Patient is living fairly independently.  She takes care of all her basic ADLs.  She says she is better than her last visit.  Her anxiety is lessened.  The patient still takes an Ativan pill at night.  She takes BuSpar 3 in the morning and 2 at night.  Her mood is good.  She is enjoying life.  She stays active.  She sleeps and eats well and has good energy and is functioning actually quite well.  She denies any chest pain shortness of breath or any neurological symptoms.  She denies any use of alcohol or drugs.  She has never been psychotic.  She is very compliant with her medications.  Unfortunately we missed her last appointment with me in 6 months as we had contact.  This interview occurred over the phone.  Virtual Visit via Telephone Note  I connected with Amanda Porter 10/02/2021 at  3:30 PM EDT by telephone and verified that I am speaking with the correct person using two identifiers.  Location: Patient: home Provider: office   I discussed the limitations, risks, security and privacy concerns of performing an evaluation and management service by telephone and the availability of in person appointments. I also discussed with the patient that there may be a patient responsible charge related to this service. The patient expressed understanding and agreed to proceed.     I discussed the assessment and treatment plan with the patient. The patient was provided an opportunity to ask questions and all were answered. The patient agreed with the plan and demonstrated an understanding of the instructions.   The patient was advised to call back or  seek an in-person evaluation if the symptoms worsen or if the condition fails to improve as anticipated.  I provided 30 minutes of non-face-to-face time during this encounter.   Jerral Ralph, MD  nic Symptoms:   Anxiety Symptoms:  Excessive Worry, Psychotic Symptoms:   PTSD Symptoms:   Past Psychiatric History: Multiple antidepressants, SSRIs Remeron  Previous Psychotropic Medications:   Substance Abuse History in the last 12 months:    Consequences of Substance Abuse:   Past Medical History:  Past Medical History:  Diagnosis Date   Anxiety    Anxiety and depression    COPD (chronic obstructive pulmonary disease) ([place])    CVA (cerebral vascular accident) ([place])    Dementia ([place])    Depression    Diabetes mellitus without complication ([place])    Dysphasia    History of lung cancer    Hyperlipidemia    Memory disturbance 11/20/2017   Normal pressure hydrocephalus ([place]) 2018   Retinal detachment    Right   Right carotid bruit 11/20/2017   Tardive dyskinesia 02/01/2020   Thrombosis    Arterial to lower extremity?    Past Surgical History:  Procedure Laterality Date   LUNG REMOVAL, PARTIAL     left upper lobe   VENTRICULOPERITONEAL SHUNT  12/09/2016    Family Psychiatric History:   Family History:  Family History  Problem Relation Age of Onset   Pneumonia Mother    Cancer Father    Diabetes Sister    Diabetes Sister  Breast cancer Neg Hx     Social History:   Social History   Socioeconomic History   Marital status: Divorced    Spouse name: Not on file   Number of children: 1   Years of education: trade school   Highest education level: Not on file  Occupational History   Occupation: Retired  Tobacco Use   Smoking status: Former    Packs/day: 0.50    Years: 50.00    Pack years: 25.00    Types: Cigarettes    Quit date: 08/01/2017    Years since quitting: 4.1   Smokeless tobacco: Never  Vaping Use   Vaping Use: Never used  Substance and  Sexual Activity   Alcohol use: No   Drug use: No   Sexual activity: Not Currently  Other Topics Concern   Not on file  Social History Narrative   Moved from [place].   Lives with daughter.   Right handed   Social Determinants of Health   Financial Resource Strain: Not on file  Food Insecurity: Not on file  Transportation Needs: Not on file  Physical Activity: Not on file  Stress: Not on file  Social Connections: Not on file    Additional Social History:   Allergies:   Allergies  Allergen Reactions   Lexapro [Escitalopram Oxalate] Other (See Comments)    Dizziness, nausea, fatigue   Neosporin  [Neomycin-Polymyxin-Gramicidin] Rash    (Neosporin)    Metabolic Disorder Labs: Lab Results  Component Value Date   HGBA1C 7.4 (H) 03/14/2021   No results found for: PROLACTIN Lab Results  Component Value Date   CHOL 130 03/14/2021   TRIG 67.0 03/14/2021   HDL 53.40 03/14/2021   CHOLHDL 2 03/14/2021   VLDL 13.4 03/14/2021   LDLCALC 63 03/14/2021   LDLCALC 80 04/23/2020   No results found for: TSH  Therapeutic Level Labs: No results found for: LITHIUM No results found for: CBMZ No results found for: VALPROATE  Current Medications: Current Outpatient Medications  Medication Sig Dispense Refill   albuterol (PROVENTIL) (2.5 MG/3ML) 0.083% nebulizer solution USE 1 VIAL IN NEBULIZER EVERY 6 HOURS 360 mL 11   albuterol (VENTOLIN HFA) 108 (90 Base) MCG/ACT inhaler INHALE 2 PUFFS BY MOUTH EVERY 4 HOURS AS NEEDED 18 g 12   alendronate (FOSAMAX) 70 MG tablet Take with a full glass of water on an empty stomach for bone density. 12 tablet 3   blood glucose meter kit and supplies KIT Dispense based on patient and insurance preference. Use up to four times daily as directed. (FOR ICD-9 250.00, 250.01). 1 each 0   buPROPion (WELLBUTRIN XL) 150 MG 24 hr tablet 3  qam 270 tablet 1   busPIRone (BUSPAR) 15 MG tablet 3  qam   2 qpm 150 tablet 4   clopidogrel (PLAVIX) 75 MG tablet TAKE 1  TABLET BY MOUTH EVERY DAY 90 tablet 3   Ferrous Sulfate (IRON PO) Take by mouth.     glipiZIDE (GLUCOTROL XL) 2.5 MG 24 hr tablet Take 1 tablet (2.5 mg total) by mouth daily with breakfast. For diabetes. 90 tablet 3   Glycopyrrolate-Formoterol (BEVESPI AEROSPHERE) 9-4.8 MCG/ACT AERO Inhale 2 puffs, twice daily (Patient not taking: Reported on 07/03/2021) 10.7 g 12   LORazepam (ATIVAN) 0.5 MG tablet 1  q am 30 tablet 4   Multiple Vitamin (MULTIVITAMIN) capsule Take 1 capsule by mouth daily.     OXYGEN 3 L continuous.      rosuvastatin (CRESTOR) 20 MG  tablet TAKE 1 TABLET (20 MG TOTAL) BY MOUTH EVERY EVENING. FOR CHOLESTEROL. 90 tablet 3   Tiotropium Bromide-Olodaterol (STIOLTO RESPIMAT) 2.5-2.5 MCG/ACT AERS Inhale 2 puffs into the lungs daily. 4 g 0   No current facility-administered medications for this visit.    Musculoskeletal: Strength & Muscle Tone: decreased Gait & Station: normal Patient leans: N/A  Psychiatric Specialty Exam: ROS  There were no vitals taken for this visit.There is no height or weight on file to calculate BMI.  General Appearance: Casual  Eye Contact:  Good  Speech:  Clear and Coherent  Volume:  Normal  Mood:  Anxious  Affect:  Appropriate  Thought Process:  Coherent  Orientation:  NA  Thought Content:  Logical  Suicidal Thoughts:  No  Homicidal Thoughts:  No  Memory:  Negative  Judgement:  Good  Insight:  Fair  Psychomotor Activity:  Normal  Concentration:    Recall:  [place] of Knowledge:Fair  Language: Fair  Akathisia:  No  Handed:  Right  AIMS (if indicated):  not done  Assets:  Desire for Improvement  ADL's:  Intact  Cognition: Impaired,  Mild  Sleep:  Good   Screenings: GAD-7    Flowsheet Row Office Visit from 02/18/2018 in [place] at [facility] Visit from 11/26/2017 in [place] at [facility]  Total GAD-7 Score 15 [place] Office Visit from 07/03/2021 in [facility] Office Visit from 07/02/2020 in [facility] Office Visit from 05/24/2019 in [facility] Office Visit from 07/30/2018 in [facility] Office Visit from 11/20/2017 in [facility]  Total Score (max 30 points ) _0 PHQ2-9    [place] Visit from 03/14/2021 in [place] at [facility] from 03/26/2018 in [facility] from 03/02/2018 in [facility]  PHQ-2 Total Score _1 PHQ-9 Total Score _2 Assessment and Plan: \   This patient has 2 problems.  First is that of major depression.  She continues taking Wellbutrin and is in remission at this time.  Her second problem is that of insomnia.  She continues taking Ativan 1 mg at night.  Her third problem is actually adjustment disorder with an anxious mood state.  She continues taking BuSpar 3 in the morning and 2 at night.  She is not in therapy.  According to her daughter who was with her she is doing very well.  She will return to see me in 5 months.

## 2021-10-10 ENCOUNTER — Other Ambulatory Visit: Payer: Self-pay

## 2021-10-10 ENCOUNTER — Ambulatory Visit
Admission: RE | Admit: 2021-10-10 | Discharge: 2021-10-10 | Disposition: A | Discharge status: Home / Self Care | Payer: Medicare PPO | Source: Ambulatory Visit | Attending: Primary Care | Admitting: Primary Care

## 2021-10-10 DIAGNOSIS — E2839 Other primary ovarian failure: Secondary | ICD-10-CM

## 2021-10-10 DIAGNOSIS — J449 Chronic obstructive pulmonary disease, unspecified: Secondary | ICD-10-CM | POA: Diagnosis not present

## 2021-10-10 DIAGNOSIS — M810 Age-related osteoporosis without current pathological fracture: Secondary | ICD-10-CM | POA: Diagnosis not present

## 2021-10-10 DIAGNOSIS — M85832 Other specified disorders of bone density and structure, left forearm: Secondary | ICD-10-CM | POA: Diagnosis not present

## 2021-10-10 DIAGNOSIS — Z780 Asymptomatic menopausal state: Secondary | ICD-10-CM | POA: Diagnosis not present

## 2021-10-17 ENCOUNTER — Other Ambulatory Visit: Payer: Self-pay | Admitting: Primary Care

## 2021-10-17 DIAGNOSIS — E119 Type 2 diabetes mellitus without complications: Secondary | ICD-10-CM

## 2021-10-17 MED ORDER — BLOOD GLUCOSE MONITOR KIT: PACK | 0 refills | Status: DC

## 2021-10-20 ENCOUNTER — Encounter: Payer: Self-pay | Admitting: Internal Medicine

## 2021-10-20 NOTE — Assessment & Plan Note (Signed)
We will try to help her compare Breztri with [employer] over [name] with daughter here so she can coach at home. Rx albuterol hfa CXR

## 2021-10-20 NOTE — Assessment & Plan Note (Signed)
We note some difficulty learning inhaler technique. May need to depend more on nebulizer, and/o spacer tube.

## 2021-10-23 ENCOUNTER — Ambulatory Visit (HOSPITAL_BASED_OUTPATIENT_CLINIC_OR_DEPARTMENT_OTHER): Payer: Medicare PPO | Admitting: Cardiology

## 2021-10-23 ENCOUNTER — Telehealth: Payer: Self-pay

## 2021-10-23 NOTE — Telephone Encounter (Signed)
Paperwork: FMLA    Paperwork received by Trecia Rogers requesting form]: [name]    Individual made aware of 3-5 business day turn around (Y/N): received via fax    Office form(s) completed and placed with paperwork (Y/N): no   Form location: Placed in [place] box from review.

## 2021-10-26 DIAGNOSIS — J449 Chronic obstructive pulmonary disease, unspecified: Secondary | ICD-10-CM | POA: Diagnosis not present

## 2021-10-28 NOTE — Telephone Encounter (Signed)
Pt daughter called stating that they need the FMLA paper work in by 11/07/21.

## 2021-10-29 NOTE — Telephone Encounter (Signed)
Signed and placed in Joellen's inbox.

## 2021-10-29 NOTE — Telephone Encounter (Signed)
Ppw faxed and left detailed message to let daughter know from has been faxed and to let our office know if she has any questions.

## 2021-11-04 ENCOUNTER — Emergency Department (HOSPITAL_COMMUNITY): Payer: Medicare PPO

## 2021-11-04 ENCOUNTER — Encounter (HOSPITAL_COMMUNITY): Payer: Self-pay

## 2021-11-04 ENCOUNTER — Inpatient Hospital Stay (HOSPITAL_COMMUNITY)
Admission: EM | Admit: 2021-11-04 | Discharge: 2021-11-12 | DRG: 100 | Disposition: A | Discharge status: Skilled Nursing Facility | Payer: Medicare PPO | Attending: Internal Medicine | Admitting: Internal Medicine

## 2021-11-04 ENCOUNTER — Other Ambulatory Visit: Payer: Self-pay

## 2021-11-04 DIAGNOSIS — T1490XA Injury, unspecified, initial encounter: Secondary | ICD-10-CM

## 2021-11-04 DIAGNOSIS — M25521 Pain in right elbow: Secondary | ICD-10-CM

## 2021-11-04 DIAGNOSIS — E43 Unspecified severe protein-calorie malnutrition: Secondary | ICD-10-CM | POA: Insufficient documentation

## 2021-11-04 DIAGNOSIS — R52 Pain, unspecified: Secondary | ICD-10-CM

## 2021-11-04 DIAGNOSIS — J9600 Acute respiratory failure, unspecified whether with hypoxia or hypercapnia: Principal | ICD-10-CM | POA: Diagnosis present

## 2021-11-04 DIAGNOSIS — N319 Neuromuscular dysfunction of bladder, unspecified: Secondary | ICD-10-CM | POA: Diagnosis present

## 2021-11-04 DIAGNOSIS — F32A Depression, unspecified: Secondary | ICD-10-CM | POA: Diagnosis present

## 2021-11-04 DIAGNOSIS — Z20822 Contact with and (suspected) exposure to covid-19: Secondary | ICD-10-CM | POA: Diagnosis present

## 2021-11-04 DIAGNOSIS — Z9981 Dependence on supplemental oxygen: Secondary | ICD-10-CM | POA: Diagnosis not present

## 2021-11-04 DIAGNOSIS — S52615A Nondisplaced fracture of left ulna styloid process, initial encounter for closed fracture: Secondary | ICD-10-CM | POA: Diagnosis present

## 2021-11-04 DIAGNOSIS — M7989 Other specified soft tissue disorders: Secondary | ICD-10-CM | POA: Diagnosis not present

## 2021-11-04 DIAGNOSIS — S59901A Unspecified injury of right elbow, initial encounter: Secondary | ICD-10-CM | POA: Diagnosis not present

## 2021-11-04 DIAGNOSIS — Z7984 Long term (current) use of oral hypoglycemic drugs: Secondary | ICD-10-CM

## 2021-11-04 DIAGNOSIS — G40409 Other generalized epilepsy and epileptic syndromes, not intractable, without status epilepticus: Principal | ICD-10-CM | POA: Diagnosis present

## 2021-11-04 DIAGNOSIS — J9690 Respiratory failure, unspecified, unspecified whether with hypoxia or hypercapnia: Secondary | ICD-10-CM | POA: Diagnosis not present

## 2021-11-04 DIAGNOSIS — M6281 Muscle weakness (generalized): Secondary | ICD-10-CM | POA: Diagnosis not present

## 2021-11-04 DIAGNOSIS — J9620 Acute and chronic respiratory failure, unspecified whether with hypoxia or hypercapnia: Secondary | ICD-10-CM

## 2021-11-04 DIAGNOSIS — J9621 Acute and chronic respiratory failure with hypoxia: Secondary | ICD-10-CM | POA: Diagnosis present

## 2021-11-04 DIAGNOSIS — Z043 Encounter for examination and observation following other accident: Secondary | ICD-10-CM | POA: Diagnosis not present

## 2021-11-04 DIAGNOSIS — E876 Hypokalemia: Secondary | ICD-10-CM | POA: Diagnosis present

## 2021-11-04 DIAGNOSIS — S52615D Nondisplaced fracture of left ulna styloid process, subsequent encounter for closed fracture with routine healing: Secondary | ICD-10-CM | POA: Diagnosis not present

## 2021-11-04 DIAGNOSIS — D509 Iron deficiency anemia, unspecified: Secondary | ICD-10-CM | POA: Diagnosis present

## 2021-11-04 DIAGNOSIS — R9431 Abnormal electrocardiogram [ECG] [EKG]: Secondary | ICD-10-CM | POA: Diagnosis not present

## 2021-11-04 DIAGNOSIS — I69354 Hemiplegia and hemiparesis following cerebral infarction affecting left non-dominant side: Secondary | ICD-10-CM

## 2021-11-04 DIAGNOSIS — F03A4 Unspecified dementia, mild, with anxiety: Secondary | ICD-10-CM | POA: Diagnosis present

## 2021-11-04 DIAGNOSIS — W1830XA Fall on same level, unspecified, initial encounter: Secondary | ICD-10-CM | POA: Diagnosis present

## 2021-11-04 DIAGNOSIS — N390 Urinary tract infection, site not specified: Secondary | ICD-10-CM | POA: Diagnosis present

## 2021-11-04 DIAGNOSIS — E119 Type 2 diabetes mellitus without complications: Secondary | ICD-10-CM | POA: Diagnosis present

## 2021-11-04 DIAGNOSIS — R627 Adult failure to thrive: Secondary | ICD-10-CM | POA: Diagnosis present

## 2021-11-04 DIAGNOSIS — Z982 Presence of cerebrospinal fluid drainage device: Secondary | ICD-10-CM | POA: Diagnosis not present

## 2021-11-04 DIAGNOSIS — S0993XA Unspecified injury of face, initial encounter: Secondary | ICD-10-CM | POA: Diagnosis not present

## 2021-11-04 DIAGNOSIS — I10 Essential (primary) hypertension: Secondary | ICD-10-CM | POA: Diagnosis present

## 2021-11-04 DIAGNOSIS — G2401 Drug induced subacute dyskinesia: Secondary | ICD-10-CM | POA: Diagnosis present

## 2021-11-04 DIAGNOSIS — I959 Hypotension, unspecified: Secondary | ICD-10-CM | POA: Diagnosis present

## 2021-11-04 DIAGNOSIS — R1310 Dysphagia, unspecified: Secondary | ICD-10-CM | POA: Diagnosis present

## 2021-11-04 DIAGNOSIS — R0689 Other abnormalities of breathing: Secondary | ICD-10-CM | POA: Diagnosis not present

## 2021-11-04 DIAGNOSIS — R410 Disorientation, unspecified: Secondary | ICD-10-CM | POA: Diagnosis not present

## 2021-11-04 DIAGNOSIS — E785 Hyperlipidemia, unspecified: Secondary | ICD-10-CM | POA: Diagnosis present

## 2021-11-04 DIAGNOSIS — B952 Enterococcus as the cause of diseases classified elsewhere: Secondary | ICD-10-CM | POA: Diagnosis present

## 2021-11-04 DIAGNOSIS — Z85118 Personal history of other malignant neoplasm of bronchus and lung: Secondary | ICD-10-CM | POA: Diagnosis not present

## 2021-11-04 DIAGNOSIS — J439 Emphysema, unspecified: Secondary | ICD-10-CM | POA: Diagnosis present

## 2021-11-04 DIAGNOSIS — Z681 Body mass index (BMI) 19 or less, adult: Secondary | ICD-10-CM

## 2021-11-04 DIAGNOSIS — Y92009 Unspecified place in unspecified non-institutional (private) residence as the place of occurrence of the external cause: Secondary | ICD-10-CM | POA: Diagnosis not present

## 2021-11-04 DIAGNOSIS — R0603 Acute respiratory distress: Secondary | ICD-10-CM | POA: Diagnosis not present

## 2021-11-04 DIAGNOSIS — R4020 Unspecified coma: Secondary | ICD-10-CM | POA: Diagnosis not present

## 2021-11-04 DIAGNOSIS — Z79899 Other long term (current) drug therapy: Secondary | ICD-10-CM

## 2021-11-04 DIAGNOSIS — Z87891 Personal history of nicotine dependence: Secondary | ICD-10-CM

## 2021-11-04 DIAGNOSIS — Z7902 Long term (current) use of antithrombotics/antiplatelets: Secondary | ICD-10-CM

## 2021-11-04 DIAGNOSIS — Z7401 Bed confinement status: Secondary | ICD-10-CM | POA: Diagnosis not present

## 2021-11-04 DIAGNOSIS — Z86718 Personal history of other venous thrombosis and embolism: Secondary | ICD-10-CM

## 2021-11-04 DIAGNOSIS — Z86711 Personal history of pulmonary embolism: Secondary | ICD-10-CM

## 2021-11-04 DIAGNOSIS — Z888 Allergy status to other drugs, medicaments and biological substances status: Secondary | ICD-10-CM

## 2021-11-04 DIAGNOSIS — S4992XA Unspecified injury of left shoulder and upper arm, initial encounter: Secondary | ICD-10-CM | POA: Diagnosis not present

## 2021-11-04 DIAGNOSIS — R278 Other lack of coordination: Secondary | ICD-10-CM | POA: Diagnosis not present

## 2021-11-04 DIAGNOSIS — G40909 Epilepsy, unspecified, not intractable, without status epilepticus: Secondary | ICD-10-CM | POA: Diagnosis present

## 2021-11-04 DIAGNOSIS — S8002XA Contusion of left knee, initial encounter: Secondary | ICD-10-CM | POA: Diagnosis present

## 2021-11-04 DIAGNOSIS — M25512 Pain in left shoulder: Secondary | ICD-10-CM | POA: Diagnosis present

## 2021-11-04 DIAGNOSIS — S299XXA Unspecified injury of thorax, initial encounter: Secondary | ICD-10-CM | POA: Diagnosis not present

## 2021-11-04 DIAGNOSIS — R4182 Altered mental status, unspecified: Secondary | ICD-10-CM | POA: Diagnosis not present

## 2021-11-04 DIAGNOSIS — R531 Weakness: Secondary | ICD-10-CM | POA: Diagnosis not present

## 2021-11-04 DIAGNOSIS — S199XXA Unspecified injury of neck, initial encounter: Secondary | ICD-10-CM | POA: Diagnosis not present

## 2021-11-04 DIAGNOSIS — R569 Unspecified convulsions: Secondary | ICD-10-CM | POA: Diagnosis not present

## 2021-11-04 DIAGNOSIS — G4089 Other seizures: Secondary | ICD-10-CM | POA: Diagnosis not present

## 2021-11-04 DIAGNOSIS — H1132 Conjunctival hemorrhage, left eye: Secondary | ICD-10-CM | POA: Diagnosis present

## 2021-11-04 DIAGNOSIS — R2689 Other abnormalities of gait and mobility: Secondary | ICD-10-CM | POA: Diagnosis not present

## 2021-11-04 DIAGNOSIS — G9340 Encephalopathy, unspecified: Secondary | ICD-10-CM | POA: Diagnosis not present

## 2021-11-04 DIAGNOSIS — T43291A Poisoning by other antidepressants, accidental (unintentional), initial encounter: Secondary | ICD-10-CM | POA: Diagnosis present

## 2021-11-04 DIAGNOSIS — R195 Other fecal abnormalities: Secondary | ICD-10-CM | POA: Diagnosis present

## 2021-11-04 HISTORY — DX: Depression, unspecified: F32.A

## 2021-11-04 HISTORY — DX: Unspecified dementia, unspecified severity, without behavioral disturbance, psychotic disturbance, mood disturbance, and anxiety: F03.90

## 2021-11-04 HISTORY — DX: Cerebral infarction, unspecified: I63.9

## 2021-11-04 HISTORY — DX: Dysphagia, unspecified: R13.10

## 2021-11-04 HISTORY — DX: Drug induced subacute dyskinesia: G24.01

## 2021-11-04 HISTORY — DX: Type 2 diabetes mellitus without complications: E11.9

## 2021-11-04 LAB — COMPREHENSIVE METABOLIC PANEL
ALT: 23 U/L (ref 0–44)
AST: 36 U/L (ref 15–41)
Albumin: 3.6 g/dL (ref 3.5–5.0)
Alkaline Phosphatase: 62 U/L (ref 38–126)
Anion gap: 16 — ABNORMAL HIGH (ref 5–15)
BUN: 15 mg/dL (ref 8–23)
CO2: 20 mmol/L — ABNORMAL LOW (ref 22–32)
Calcium: 9.8 mg/dL (ref 8.9–10.3)
Chloride: 100 mmol/L (ref 98–111)
Creatinine, Ser: 1.13 mg/dL — ABNORMAL HIGH (ref 0.44–1.00)
GFR, Estimated: 50 mL/min — ABNORMAL LOW (ref >60)
Glucose, Bld: 161 mg/dL — ABNORMAL HIGH (ref 70–99)
Potassium: 4 mmol/L (ref 3.5–5.1)
Sodium: 136 mmol/L (ref 135–145)
Total Bilirubin: 0.2 mg/dL — ABNORMAL LOW (ref 0.3–1.2)
Total Protein: 7.2 g/dL (ref 6.5–8.1)

## 2021-11-04 LAB — CBC
HCT: 34.2 % — ABNORMAL LOW (ref 36.0–46.0)
Hemoglobin: 9.9 g/dL — ABNORMAL LOW (ref 12.0–15.0)
MCH: 28 pg (ref 26.0–34.0)
MCHC: 28.9 g/dL — ABNORMAL LOW (ref 30.0–36.0)
MCV: 96.6 fL (ref 80.0–100.0)
Platelets: 361 10*3/uL (ref 150–400)
RBC: 3.54 MIL/uL — ABNORMAL LOW (ref 3.87–5.11)
RDW: 12.8 % (ref 11.5–15.5)
WBC: 16.2 10*3/uL — ABNORMAL HIGH (ref 4.0–10.5)
nRBC: 0 % (ref 0.0–0.2)

## 2021-11-04 LAB — I-STAT CHEM 8, ED
BUN: 15 mg/dL (ref 8–23)
Calcium, Ion: 1.13 mmol/L — ABNORMAL LOW (ref 1.15–1.40)
Chloride: 101 mmol/L (ref 98–111)
Creatinine, Ser: 0.8 mg/dL (ref 0.44–1.00)
Glucose, Bld: 158 mg/dL — ABNORMAL HIGH (ref 70–99)
HCT: 34 % — ABNORMAL LOW (ref 36.0–46.0)
Hemoglobin: 11.6 g/dL — ABNORMAL LOW (ref 12.0–15.0)
Potassium: 4 mmol/L (ref 3.5–5.1)
Sodium: 141 mmol/L (ref 135–145)
TCO2: 22 mmol/L (ref 22–32)

## 2021-11-04 LAB — I-STAT ARTERIAL BLOOD GAS, ED
Acid-Base Excess: 1 mmol/L (ref 0.0–2.0)
Bicarbonate: 27.5 mmol/L (ref 20.0–28.0)
Calcium, Ion: 1.18 mmol/L (ref 1.15–1.40)
HCT: 27 % — ABNORMAL LOW (ref 36.0–46.0)
Hemoglobin: 9.2 g/dL — ABNORMAL LOW (ref 12.0–15.0)
O2 Saturation: 100 %
Patient temperature: 98.6
Potassium: 3.3 mmol/L — ABNORMAL LOW (ref 3.5–5.1)
Sodium: 143 mmol/L (ref 135–145)
TCO2: 29 mmol/L (ref 22–32)
pCO2 arterial: 55.7 mmHg — ABNORMAL HIGH (ref 32.0–48.0)
pH, Arterial: 7.302 — ABNORMAL LOW (ref 7.350–7.450)
pO2, Arterial: 530 mmHg — ABNORMAL HIGH (ref 83.0–108.0)

## 2021-11-04 LAB — RESP PANEL BY RT-PCR (FLU A&B, COVID) ARPGX2
Influenza A by PCR: NEGATIVE
Influenza B by PCR: NEGATIVE
SARS Coronavirus 2 by RT PCR: NEGATIVE

## 2021-11-04 LAB — CBG MONITORING, ED: Glucose-Capillary: 138 mg/dL — ABNORMAL HIGH (ref 70–99)

## 2021-11-04 LAB — PROTIME-INR
INR: 1.1 (ref 0.8–1.2)
Prothrombin Time: 14.7 seconds (ref 11.4–15.2)

## 2021-11-04 LAB — URINALYSIS, ROUTINE W REFLEX MICROSCOPIC
Bilirubin Urine: NEGATIVE
Glucose, UA: 100 mg/dL — AB
Hgb urine dipstick: NEGATIVE
Ketones, ur: NEGATIVE mg/dL
Leukocytes,Ua: NEGATIVE
Nitrite: NEGATIVE
Protein, ur: NEGATIVE mg/dL
Specific Gravity, Urine: 1.025 (ref 1.005–1.030)
pH: 6.5 (ref 5.0–8.0)

## 2021-11-04 LAB — ACETAMINOPHEN LEVEL: Acetaminophen (Tylenol), Serum: <10 ug/mL — ABNORMAL LOW (ref 10–30)

## 2021-11-04 LAB — LACTIC ACID, PLASMA: Lactic Acid, Venous: >9 mmol/L (ref 0.5–1.9)

## 2021-11-04 LAB — SAMPLE TO BLOOD BANK

## 2021-11-04 LAB — ABO/RH: ABO/RH(D): B POS

## 2021-11-04 LAB — SALICYLATE LEVEL: Salicylate Lvl: <7 mg/dL — ABNORMAL LOW (ref 7.0–30.0)

## 2021-11-04 LAB — ETHANOL: Alcohol, Ethyl (B): <10 mg/dL (ref <10)

## 2021-11-04 MED ORDER — LEVETIRACETAM IN NACL 500 MG/100ML IV SOLN
500.0000 mg | Freq: Once | INTRAVENOUS | Status: AC
  Administered 2021-11-04: 500 mg via INTRAVENOUS
  Filled 2021-11-04: qty 100

## 2021-11-04 MED ORDER — CHARCOAL ACTIVATED PO LIQD
50.0000 g | Freq: Once | ORAL | Status: AC
  Administered 2021-11-05: 50 g via ORAL
  Filled 2021-11-04: qty 240

## 2021-11-04 MED ORDER — ETOMIDATE 2 MG/ML IV SOLN
INTRAVENOUS | Status: AC | PRN
  Administered 2021-11-04: 20 mg via INTRAVENOUS

## 2021-11-04 MED ORDER — PROPOFOL 1000 MG/100ML IV EMUL
INTRAVENOUS | Status: AC
  Filled 2021-11-04: qty 100

## 2021-11-04 MED ORDER — PROPOFOL 1000 MG/100ML IV EMUL
INTRAVENOUS | Status: AC
  Administered 2021-11-04: 20 ug/kg/min via INTRAVENOUS
  Filled 2021-11-04: qty 100

## 2021-11-04 MED ORDER — ROCURONIUM BROMIDE 50 MG/5ML IV SOLN
INTRAVENOUS | Status: AC | PRN
  Administered 2021-11-04: 50 mg via INTRAVENOUS

## 2021-11-04 MED ORDER — SODIUM CHLORIDE 0.9 % IV BOLUS
1000.0000 mL | Freq: Once | INTRAVENOUS | Status: AC
  Administered 2021-11-04: 1000 mL via INTRAVENOUS

## 2021-11-04 MED ORDER — SUCCINYLCHOLINE CHLORIDE 20 MG/ML IJ SOLN
INTRAMUSCULAR | Status: AC | PRN
  Administered 2021-11-04: 150 mg via INTRAVENOUS

## 2021-11-04 MED ORDER — LEVETIRACETAM IN NACL 1000 MG/100ML IV SOLN
1000.0000 mg | Freq: Once | INTRAVENOUS | Status: AC
  Administered 2021-11-04: 1000 mg via INTRAVENOUS

## 2021-11-04 MED ORDER — PROPOFOL 1000 MG/100ML IV EMUL
5.0000 ug/kg/min | INTRAVENOUS | Status: DC
Start: 2021-11-04 — End: 2021-11-06

## 2021-11-04 NOTE — Progress Notes (Signed)
Orthopedic Tech Progress Note Patient Details:  Amanda Porter 08-Jun-1946 [id number] Level 1 trauma Patient ID: Amanda Porter, female   DOB: Mar 10, 1946, 75 y.o.   MRN: [id number]  Amanda Porter 11/04/2021, 10:00 PM

## 2021-11-04 NOTE — Progress Notes (Signed)
Patient transported from Trauma B to CT and back with no complications noted.

## 2021-11-04 NOTE — Consult Note (Signed)
CC: unable to obtain  Requesting provider: dr Reather Converse  HPI: Amanda Porter is an 75 y.o. female who is here for evaluation as a level 1 trauma alert.  Patient has a past medical history of stroke with some residual deficits, dementia, diabetes mellitus type 2, tardive dyskinesia and a VP shunt who suffered a fall in late afternoon today.  He was ground-level fall.  The patient has a indoor video cam that is monitored by one of her family members remotely.  A caregiver arrived at the house at around 8 PM.  The patient was noted to be more confused than typical.  Because of the recent fall and the concern for worsening confusion than her baseline EMS was contacted and the patient was transported to the emergency room.  In route the patient suffered a seizure.  Reportedly on arrival to the ER the patient was a GCS of 3 and requiring bag mask ventilation.  She was shortly thereafter intubated.  Pupils were equal and round and reactive prior to receiving medication for intubation.  Patient is on Plavix.  Patient was reportedly following commands at the house prior to transport but was found on the floor when family member arrived at the house.  She was awake and alert at the house but did suffered the seizure in route.  A family mother did look at her pillbox and informed us that the patient's medications which are normally laid out by days are missing medications for Monday Tuesday and Wednesday of this week.  The missing medications include Wellbutrin 150 mg XL, BuSpar, and glipizide XL.  Past Medical History:  Diagnosis Date   CVA (cerebral vascular accident) ([place])    L sided deficits   Dementia ([place])    Depression    Diabetes mellitus ([place])    Type 2   Dysphagia    Tardive dyskinesia     Past Surgical History:  Procedure Laterality Date   VENTRICULOPERITONEAL SHUNT      History reviewed. No pertinent family history.  Social:  reports that she has quit smoking. Her smoking use included  cigarettes. She has never used smokeless tobacco. She reports that she does not currently use alcohol. She reports that she does not currently use drugs.  Allergies:  Allergies  Allergen Reactions   Lexapro [Escitalopram]    Neosporin [Bacitracin-Polymyxin B] Rash    Medications: I have reviewed the patient's current medications.   ROS - unable to obtain - pt intubated  PE Blood pressure 125/65, pulse 84, temperature 97.7 F (36.5 C), resp. rate (!) 26, height 5\' 3"  (1.6 m), weight 52.2 kg, SpO2 100 %. Constitutional: NAD; intubated, sedated; no deformities Eyes: Moist conjunctiva; no lid lag; anicteric; PERRL Neck: Trachea midline; no thyromegaly Lungs: Normal respiratory effort; no tactile fremitus CV: RRR; no palpable thrills; no pitting edema GI: Abd soft, nondistended, old scars; no palpable hepatosplenomegaly MSK: no clubbing/cyanosis; mild bruising/swelling L wrist, o/w no palpable deformity Psychiatric: unable to access Lymphatic: No palpable cervical or axillary lymphadenopathy Skin:bruising L wrist, no rash Neuro: GCS 3 (sedated for intubated)  Results for orders placed or performed during the hospital encounter of 11/04/21 (from the past 48 hour(s))  Sample to Blood Bank     Status: None   Collection Time: 11/04/21  9:40 PM  Result Value Ref Range   Blood Bank Specimen SAMPLE AVAILABLE FOR TESTING    Sample Expiration      11/07/2021,2359 Performed at [facility], [place] [address],  [place], [place] [address]   Comprehensive metabolic panel     Status: Abnormal   Collection Time: 11/04/21  9:44 PM  Result Value Ref Range   Sodium 136 135 - 145 mmol/L   Potassium 4.0 3.5 - 5.1 mmol/L   Chloride 100 98 - 111 mmol/L   CO2 20 (L) 22 - 32 mmol/L   Glucose, Bld 161 (H) 70 - 99 mg/dL    Comment: Glucose reference range applies only to samples taken after fasting for at least 8 hours.   BUN 15 8 - 23 mg/dL   Creatinine, Ser 1.13 (H) 0.44 - 1.00 mg/dL   Calcium  9.8 8.9 - 10.3 mg/dL   Total Protein 7.2 6.5 - 8.1 g/dL   Albumin 3.6 3.5 - 5.0 g/dL   AST 36 15 - 41 U/L   ALT 23 0 - 44 U/L   Alkaline Phosphatase 62 38 - 126 U/L   Total Bilirubin 0.2 (L) 0.3 - 1.2 mg/dL   GFR, Estimated 50 (L) >60 mL/min    Comment: (NOTE) Calculated using the CKD-EPI Creatinine Equation (2021)    Anion gap 16 (H) 5 - 15    Comment: Performed at [facility] [address], [place], [place] [address]  CBC     Status: Abnormal   Collection Time: 11/04/21  9:44 PM  Result Value Ref Range   WBC 16.2 (H) 4.0 - 10.5 K/uL   RBC 3.54 (L) 3.87 - 5.11 MIL/uL   Hemoglobin 9.9 (L) 12.0 - 15.0 g/dL   HCT 34.2 (L) 36.0 - 46.0 %   MCV 96.6 80.0 - 100.0 fL   MCH 28.0 26.0 - 34.0 pg   MCHC 28.9 (L) 30.0 - 36.0 g/dL   RDW 12.8 11.5 - 15.5 %   Platelets 361 150 - 400 K/uL   nRBC 0.0 0.0 - 0.2 %    Comment: Performed at [facility], [place] [address] [place], [place] [address]  Ethanol     Status: None   Collection Time: 11/04/21  9:44 PM  Result Value Ref Range   Alcohol, Ethyl (B) <10 <10 mg/dL    Comment: (NOTE) Lowest detectable limit for serum alcohol is 10 mg/dL.  For medical purposes only. Performed at [facility], [place] [address] [place], [place] [address]   Lactic acid, plasma     Status: Abnormal   Collection Time: 11/04/21  9:44 PM  Result Value Ref Range   Lactic Acid, Venous >9.0 (HH) 0.5 - 1.9 mmol/L    Comment: CRITICAL RESULT CALLED TO, READ BACK BY AND VERIFIED WITH: BENTON B,RN 11/04/21 2230 WAYK Performed at [facility], [place] [address], [place], [place] [address]   Protime-INR     Status: None   Collection Time: 11/04/21  9:44 PM  Result Value Ref Range   Prothrombin Time 14.7 11.4 - 15.2 seconds   INR 1.1 0.8 - 1.2    Comment: (NOTE) INR goal varies based on device and disease states. Performed at [facility], [place] [address], [place], [place] [address]   I-Stat Chem 8, ED     Status: Abnormal   Collection  Time: 11/04/21  9:49 PM  Result Value Ref Range   Sodium 141 135 - 145 mmol/L   Potassium 4.0 3.5 - 5.1 mmol/L   Chloride 101 98 - 111 mmol/L   BUN 15 8 - 23 mg/dL   Creatinine, Ser 0.80 0.44 - 1.00 mg/dL  Glucose, Bld 158 (H) 70 - 99 mg/dL    Comment: Glucose reference range applies only to samples taken after fasting for at least 8 hours.   Calcium, Ion 1.13 (L) 1.15 - 1.40 mmol/L   TCO2 22 22 - 32 mmol/L   Hemoglobin 11.6 (L) 12.0 - 15.0 g/dL   HCT 34.0 (L) 36.0 - 46.0 %  ABO/Rh     Status: None   Collection Time: 11/04/21  9:50 PM  Result Value Ref Range   ABO/RH(D)      B POS Performed at [facility] [address], [place], [place] [address]   Resp Panel by RT-PCR (Flu A&B, Covid) Nasopharyngeal Swab     Status: None   Collection Time: 11/04/21 10:05 PM   Specimen: Nasopharyngeal Swab; Nasopharyngeal(NP) swabs in vial transport medium  Result Value Ref Range   SARS Coronavirus 2 by RT PCR NEGATIVE NEGATIVE    Comment: (NOTE) SARS-CoV-2 target nucleic acids are NOT DETECTED.  The SARS-CoV-2 RNA is generally detectable in upper respiratory specimens during the acute phase of infection. The lowest concentration of SARS-CoV-2 viral copies this assay can detect is 138 copies/mL. A negative result does not preclude SARS-Cov-2 infection and should not be used as the sole basis for treatment or other patient management decisions. A negative result may occur with  improper specimen collection/handling, submission of specimen other than nasopharyngeal swab, presence of viral mutation(s) within the areas targeted by this assay, and inadequate number of viral copies(<138 copies/mL). A negative result must be combined with clinical observations, patient history, and epidemiological information. The expected result is Negative.  Fact Sheet for Patients:  EntrepreneurPulse.com.au  Fact Sheet for Healthcare Providers:   IncredibleEmployment.be  This test is no t yet approved or cleared by the [place] FDA and  has been authorized for detection and/or diagnosis of SARS-CoV-2 by FDA under an Emergency Use Authorization (EUA). This EUA will remain  in effect (meaning this test can be used) for the duration of the COVID-19 declaration under Section 564(b)(1) of the Act, 21 U.S.C.section 360bbb-3(b)(1), unless the authorization is terminated  or revoked sooner.       Influenza A by PCR NEGATIVE NEGATIVE   Influenza B by PCR NEGATIVE NEGATIVE    Comment: (NOTE) The Xpert Xpress SARS-CoV-2/FLU/RSV plus assay is intended as an aid in the diagnosis of influenza from Nasopharyngeal swab specimens and should not be used as a sole basis for treatment. Nasal washings and aspirates are unacceptable for Xpert Xpress SARS-CoV-2/FLU/RSV testing.  Fact Sheet for Patients: EntrepreneurPulse.com.au  Fact Sheet for Healthcare Providers: IncredibleEmployment.be  This test is not yet approved or cleared by the [place] FDA and has been authorized for detection and/or diagnosis of SARS-CoV-2 by FDA under an Emergency Use Authorization (EUA). This EUA will remain in effect (meaning this test can be used) for the duration of the COVID-19 declaration under Section 564(b)(1) of the Act, 21 U.S.C. section 360bbb-3(b)(1), unless the authorization is terminated or revoked.  Performed at [facility], [place] [address] [place], [place] [address]   I-Stat arterial blood gas, ED     Status: Abnormal   Collection Time: 11/04/21 10:21 PM  Result Value Ref Range   pH, Arterial 7.302 (L) 7.350 - 7.450   pCO2 arterial 55.7 (H) 32.0 - 48.0 mmHg   pO2, Arterial 530 (H) 83.0 - 108.0 mmHg   Bicarbonate 27.5 20.0 - 28.0 mmol/L   TCO2 29 22 - 32 mmol/L  O2 Saturation 100.0 %   Acid-Base Excess 1.0 0.0 - 2.0 mmol/L   Sodium 143 135 - 145 mmol/L   Potassium 3.3  (L) 3.5 - 5.1 mmol/L   Calcium, Ion 1.18 1.15 - 1.40 mmol/L   HCT 27.0 (L) 36.0 - 46.0 %   Hemoglobin 9.2 (L) 12.0 - 15.0 g/dL   Patient temperature 98.6 F    Collection site RADIAL, ALLEN'S TEST ACCEPTABLE    Drawn by RT    Sample type ARTERIAL   Urinalysis, Routine w reflex microscopic Urine, Catheterized     Status: Abnormal   Collection Time: 11/04/21 10:27 PM  Result Value Ref Range   Color, Urine YELLOW YELLOW   APPearance HAZY (A) CLEAR   Specific Gravity, Urine 1.025 1.005 - 1.030   pH 6.5 5.0 - 8.0   Glucose, UA 100 (A) NEGATIVE mg/dL   Hgb urine dipstick NEGATIVE NEGATIVE   Bilirubin Urine NEGATIVE NEGATIVE   Ketones, ur NEGATIVE NEGATIVE mg/dL   Protein, ur NEGATIVE NEGATIVE mg/dL   Nitrite NEGATIVE NEGATIVE   Leukocytes,Ua NEGATIVE NEGATIVE    Comment: Microscopic not done on urines with negative protein, blood, leukocytes, nitrite, or glucose < 500 mg/dL. Performed at [facility], [place] [address], [place], [place] [address]     CT HEAD WO CONTRAST  Result Date: 11/04/2021 CLINICAL DATA:  Head trauma, mod-severe; Neck trauma (Age >= 65y); Facial trauma. Seizure, fall, respiratory failure EXAM: CT HEAD WITHOUT CONTRAST CT MAXILLOFACIAL WITHOUT CONTRAST CT CERVICAL SPINE WITHOUT CONTRAST TECHNIQUE: Multidetector CT imaging of the head, cervical spine, and maxillofacial structures were performed using the standard protocol without intravenous contrast. Multiplanar CT image reconstructions of the cervical spine and maxillofacial structures were also generated. COMPARISON:  CT head 07/06/2021 FINDINGS: CT HEAD FINDINGS Brain: Right frontal ventricular shunt catheter abutting the septum pellucidum is unchanged. Extensive periventricular white matter changes are present, stable since prior examination, nonspecific. This may reflect the sequela of small vessel ischemia or demyelinating disease. Mild ventriculomegaly is unchanged possibly representing the sequela of central  atrophy. Remote lacunar infarct noted within the right thalamus. No evidence of acute intracranial hemorrhage or infarct. No abnormal mass effect or midline shift. No abnormal intra or extra-axial mass lesion. Cerebellum is unremarkable. Vascular: No asymmetric hyperdense vasculature at the skull base. Skull: No acute fracture Other: Mastoid air cells and middle ear cavities are clear. CT MAXILLOFACIAL FINDINGS Osseous: No fracture or mandibular dislocation. No destructive process. Orbits: Negative. No traumatic or inflammatory finding. Sinuses: Clear. Soft tissues: Facial soft tissues are unremarkable. Endotracheal tube and nasogastric tube are visualized. CT CERVICAL SPINE FINDINGS Alignment: Normal. Skull base and vertebrae: Craniocervical alignment is normal. There is prominent enthesophyte formation at the level of the apical ligament at the craniocervical junction. Atlantal dental interval is not widened. There is no acute fracture of the cervical spine. Soft tissues and spinal canal: The spinal canal is widely patent. No canal hematoma. No prevertebral soft tissue swelling or fluid collections identified. No pathologic adenopathy within the cervical soft tissues. Visualized thyroid is unremarkable. Disc levels: There is intervertebral disc space narrowing at C5-C7 with associated endplate remodeling in keeping with changes of moderate degenerative disc disease. The prevertebral soft tissues are not thickened on sagittal reformats. Review of the axial images demonstrates mild to moderate bilateral neuroforaminal narrowing at C5-6 and C6-7 secondary to uncovertebral arthrosis mild bilateral neuroforaminal narrowing also noted at C7-T1. Upper chest: Emphysema noted. Other: None IMPRESSION: No acute intracranial injury.  No calvarial fracture.  No acute facial fracture or mandibular dislocation. No acute fracture or listhesis of the cervical spine. Emphysema (ICD10-J43.9). These results were called by telephone at  the time of interpretation on 11/04/2021 at 10:42 pm to provider Redmond Pulling, MD, who verbally acknowledged these results. Electronically Signed   By: Fidela Salisbury M.D.   On: 11/04/2021 22:45   CT CERVICAL SPINE WO CONTRAST  Result Date: 11/04/2021 CLINICAL DATA:  Head trauma, mod-severe; Neck trauma (Age >= 65y); Facial trauma. Seizure, fall, respiratory failure EXAM: CT HEAD WITHOUT CONTRAST CT MAXILLOFACIAL WITHOUT CONTRAST CT CERVICAL SPINE WITHOUT CONTRAST TECHNIQUE: Multidetector CT imaging of the head, cervical spine, and maxillofacial structures were performed using the standard protocol without intravenous contrast. Multiplanar CT image reconstructions of the cervical spine and maxillofacial structures were also generated. COMPARISON:  CT head 07/06/2021 FINDINGS: CT HEAD FINDINGS Brain: Right frontal ventricular shunt catheter abutting the septum pellucidum is unchanged. Extensive periventricular white matter changes are present, stable since prior examination, nonspecific. This may reflect the sequela of small vessel ischemia or demyelinating disease. Mild ventriculomegaly is unchanged possibly representing the sequela of central atrophy. Remote lacunar infarct noted within the right thalamus. No evidence of acute intracranial hemorrhage or infarct. No abnormal mass effect or midline shift. No abnormal intra or extra-axial mass lesion. Cerebellum is unremarkable. Vascular: No asymmetric hyperdense vasculature at the skull base. Skull: No acute fracture Other: Mastoid air cells and middle ear cavities are clear. CT MAXILLOFACIAL FINDINGS Osseous: No fracture or mandibular dislocation. No destructive process. Orbits: Negative. No traumatic or inflammatory finding. Sinuses: Clear. Soft tissues: Facial soft tissues are unremarkable. Endotracheal tube and nasogastric tube are visualized. CT CERVICAL SPINE FINDINGS Alignment: Normal. Skull base and vertebrae: Craniocervical alignment is normal. There is  prominent enthesophyte formation at the level of the apical ligament at the craniocervical junction. Atlantal dental interval is not widened. There is no acute fracture of the cervical spine. Soft tissues and spinal canal: The spinal canal is widely patent. No canal hematoma. No prevertebral soft tissue swelling or fluid collections identified. No pathologic adenopathy within the cervical soft tissues. Visualized thyroid is unremarkable. Disc levels: There is intervertebral disc space narrowing at C5-C7 with associated endplate remodeling in keeping with changes of moderate degenerative disc disease. The prevertebral soft tissues are not thickened on sagittal reformats. Review of the axial images demonstrates mild to moderate bilateral neuroforaminal narrowing at C5-6 and C6-7 secondary to uncovertebral arthrosis mild bilateral neuroforaminal narrowing also noted at C7-T1. Upper chest: Emphysema noted. Other: None IMPRESSION: No acute intracranial injury.  No calvarial fracture. No acute facial fracture or mandibular dislocation. No acute fracture or listhesis of the cervical spine. Emphysema (ICD10-J43.9). These results were called by telephone at the time of interpretation on 11/04/2021 at 10:42 pm to provider Redmond Pulling, MD, who verbally acknowledged these results. Electronically Signed   By: Fidela Salisbury M.D.   On: 11/04/2021 22:45   DG Pelvis Portable  Result Date: 11/04/2021 CLINICAL DATA:  Fall. EXAM: PORTABLE PELVIS 1-2 VIEWS COMPARISON:  None. FINDINGS: No acute fracture or dislocation. Mild lower lumbar degenerative changes. A catheter is partially visualized over the pelvis. The soft tissues are unremarkable. IMPRESSION: No acute findings. Electronically Signed   By: Anner Crete M.D.   On: 11/04/2021 22:00   DG Chest Port 1 View  Addendum Date: 11/04/2021   ADDENDUM REPORT: 11/04/2021 22:20 ADDENDUM: These results were called by telephone at the time of interpretation on 11/04/2021 at 10:07 pm to  provider JOSHUA ZAVITZ , who  verbally acknowledged these results. Electronically Signed   By: Ronney Asters M.D.   On: 11/04/2021 22:20   Result Date: 11/04/2021 CLINICAL DATA:  Trauma, fall. EXAM: PORTABLE CHEST 1 VIEW COMPARISON:  Chest x-ray 06/12/2021. FINDINGS: There is right mainstem intubation 7 mm. Enteric tube extends below the diaphragm. Right-sided central venous catheter tip projects over the mid SVC. The cardiomediastinal silhouette appears stable. Left suprahilar density and surgical clips are again noted. Cardiac silhouette within normal limits. There is no pleural effusion or pneumothorax identified. There is some strandy opacities in the left lung base. No acute fractures are seen. IMPRESSION: 1. Right mainstem intubation.  Recommend retraction 4 cm. 2. Enteric tube extends below the diaphragm. 3. Minimal left basilar atelectasis/airspace disease. 4. Stable chronic and postsurgical changes in the left suprahilar region. Electronically Signed: By: Ronney Asters M.D. On: 11/04/2021 22:02   CT MAXILLOFACIAL WO CONTRAST  Result Date: 11/04/2021 CLINICAL DATA:  Head trauma, mod-severe; Neck trauma (Age >= 65y); Facial trauma. Seizure, fall, respiratory failure EXAM: CT HEAD WITHOUT CONTRAST CT MAXILLOFACIAL WITHOUT CONTRAST CT CERVICAL SPINE WITHOUT CONTRAST TECHNIQUE: Multidetector CT imaging of the head, cervical spine, and maxillofacial structures were performed using the standard protocol without intravenous contrast. Multiplanar CT image reconstructions of the cervical spine and maxillofacial structures were also generated. COMPARISON:  CT head 07/06/2021 FINDINGS: CT HEAD FINDINGS Brain: Right frontal ventricular shunt catheter abutting the septum pellucidum is unchanged. Extensive periventricular white matter changes are present, stable since prior examination, nonspecific. This may reflect the sequela of small vessel ischemia or demyelinating disease. Mild ventriculomegaly is unchanged  possibly representing the sequela of central atrophy. Remote lacunar infarct noted within the right thalamus. No evidence of acute intracranial hemorrhage or infarct. No abnormal mass effect or midline shift. No abnormal intra or extra-axial mass lesion. Cerebellum is unremarkable. Vascular: No asymmetric hyperdense vasculature at the skull base. Skull: No acute fracture Other: Mastoid air cells and middle ear cavities are clear. CT MAXILLOFACIAL FINDINGS Osseous: No fracture or mandibular dislocation. No destructive process. Orbits: Negative. No traumatic or inflammatory finding. Sinuses: Clear. Soft tissues: Facial soft tissues are unremarkable. Endotracheal tube and nasogastric tube are visualized. CT CERVICAL SPINE FINDINGS Alignment: Normal. Skull base and vertebrae: Craniocervical alignment is normal. There is prominent enthesophyte formation at the level of the apical ligament at the craniocervical junction. Atlantal dental interval is not widened. There is no acute fracture of the cervical spine. Soft tissues and spinal canal: The spinal canal is widely patent. No canal hematoma. No prevertebral soft tissue swelling or fluid collections identified. No pathologic adenopathy within the cervical soft tissues. Visualized thyroid is unremarkable. Disc levels: There is intervertebral disc space narrowing at C5-C7 with associated endplate remodeling in keeping with changes of moderate degenerative disc disease. The prevertebral soft tissues are not thickened on sagittal reformats. Review of the axial images demonstrates mild to moderate bilateral neuroforaminal narrowing at C5-6 and C6-7 secondary to uncovertebral arthrosis mild bilateral neuroforaminal narrowing also noted at C7-T1. Upper chest: Emphysema noted. Other: None IMPRESSION: No acute intracranial injury.  No calvarial fracture. No acute facial fracture or mandibular dislocation. No acute fracture or listhesis of the cervical spine. Emphysema  (ICD10-J43.9). These results were called by telephone at the time of interpretation on 11/04/2021 at 10:42 pm to provider Redmond Pulling, MD, who verbally acknowledged these results. Electronically Signed   By: Fidela Salisbury M.D.   On: 11/04/2021 22:45    Imaging: reviewed  A/P: Amanda Porter is an 75 y.o.  female  S/p fall Seizure event Dementia VP shunt/ normal pressure hydrocephalus H/o CVA with deficits (per family) Tardive dyskinesia On plavix Possible inadvertent medication overdose  There is no traumatic injury on her head C-spine or face. It is unclear if the seizure event was due to the fall or perhaps medication overdose-probably Wellbutrin  Wrist x-rays pending  Recommended medicine admission-ICU admission Trauma response nurse contacted poison control and will begin treatment for overdosage of Wellbutrin At risk for torsades, hypoglycemia and hypocalcemia, check fingersticks hourly to monitor blood glucose level-please see additional documentation in nursing notes regarding overdose protocol for the Wellbutrin    Leighton Ruff. Redmond Pulling, MD, FACS General, Bariatric, & Minimally Invasive Surgery [facility], [place]

## 2021-11-04 NOTE — ED Provider Notes (Signed)
[facility] EMERGENCY DEPARTMENT Provider Note   CSN: [id number] Arrival date & time: 11/04/21  2137     History Chief Complaint  Patient presents with   Trauma    Amanda Porter is a 75 y.o. female.  The history is provided by the patient and medical records.  Altered Mental Status Presenting symptoms: unresponsiveness   Severity:  Severe Most recent episode:  Today Episode history:  Single Duration: Last known normal 5:30 PM. Timing:  Constant Progression:  Unchanged Context comment:  Found down with ecchymosis surrounding left eye.  She is on Plavix and has a VP shunt Associated symptoms: seizures   Associated symptoms: no fever       Past Medical History:  Diagnosis Date   CVA (cerebral vascular accident) ([place])    L sided deficits   Dementia ([place])    Depression    Diabetes mellitus ([place])    Type 2   Dysphagia    Tardive dyskinesia     There are no problems to display for this patient.   OB History   No obstetric history on file.     History reviewed. No pertinent family history.  Social History   Tobacco Use   Smoking status: Former    Types: Cigarettes   Smokeless tobacco: Never  Substance Use Topics   Alcohol use: Not Currently   Drug use: Not Currently    Home Medications Prior to Admission medications   Not on File    Allergies    Lexapro [escitalopram] and Neosporin [bacitracin-polymyxin b]  Review of Systems   Review of Systems  Unable to perform ROS: Patient unresponsive  Constitutional:  Negative for fever.  Skin:  Positive for wound.  Neurological:  Positive for seizures.   Physical Exam Updated Vital Signs BP 125/65   Pulse 84   Temp 97.7 F (36.5 C)   Resp (!) 26   Ht _0  (1.6 m)   Wt 52.2 kg   SpO2 100%   BMI 20.37 kg/m   Physical Exam Vitals and nursing note reviewed.  Constitutional:      General: She is in acute distress (moderate distress, intermittently agitated and combatitive).      Appearance: She is well-developed.  HENT:     Head: Normocephalic.     Comments: Ecchymosis surrounding left eye with a subconjunctival hemorrhage.  No proptosis.  No pupil irregularities.    Right Ear: External ear normal.     Left Ear: External ear normal.     Nose: Nose normal. No congestion or rhinorrhea.     Mouth/Throat:     Mouth: Mucous membranes are moist.  Eyes:     Extraocular Movements: Extraocular movements intact.     Pupils: Pupils are equal, round, and reactive to light.  Neck:     Comments: C-collar placed Cardiovascular:     Rate and Rhythm: Normal rate and regular rhythm.     Pulses: Normal pulses.     Heart sounds: No murmur heard. Pulmonary:     Effort: Respiratory distress (Moderate respiratory distress, receiving assisted ventilations with BVM, spontaneous respirations noted) present.     Breath sounds: No wheezing, rhonchi or rales.  Abdominal:     General: Abdomen is flat. Bowel sounds are normal.     Palpations: Abdomen is soft.     Tenderness: There is no guarding or rebound.  Musculoskeletal:        General: Signs of injury (Signs of injury to the left  wrist with ecchymosis to the dorsum.  No obvious deformity) present.     Cervical back: Normal range of motion and neck supple. No rigidity.  Skin:    General: Skin is warm and dry.     Capillary Refill: Capillary refill takes less than 2 seconds.  Neurological:     Mental Status: She is unresponsive.     GCS: GCS eye subscore is 1. GCS verbal subscore is 1. GCS motor subscore is 3.     Motor: No tremor, abnormal muscle tone or seizure activity.     Comments: Limited neuro assessment secondary to mental status changes  Psychiatric:        Mood and Affect: Mood normal.    ED Results / Procedures / Treatments   Labs (all labs ordered are listed, but only abnormal results are displayed) Labs Reviewed  COMPREHENSIVE METABOLIC PANEL - Abnormal; Notable for the following components:      Result Value    CO2 20 (*)    Glucose, Bld 161 (*)    Creatinine, Ser 1.13 (*)    Total Bilirubin 0.2 (*)    GFR, Estimated 50 (*)    Anion gap 16 (*)    All other components within normal limits  CBC - Abnormal; Notable for the following components:   WBC 16.2 (*)    RBC 3.54 (*)    Hemoglobin 9.9 (*)    HCT 34.2 (*)    MCHC 28.9 (*)    All other components within normal limits  URINALYSIS, ROUTINE W REFLEX MICROSCOPIC - Abnormal; Notable for the following components:   APPearance HAZY (*)    Glucose, UA 100 (*)    All other components within normal limits  LACTIC ACID, PLASMA - Abnormal; Notable for the following components:   Lactic Acid, Venous >9.0 (*)    All other components within normal limits  SALICYLATE LEVEL - Abnormal; Notable for the following components:   Salicylate Lvl <5.9 (*)    All other components within normal limits  ACETAMINOPHEN LEVEL - Abnormal; Notable for the following components:   Acetaminophen (Tylenol), Serum <10 (*)    All other components within normal limits  I-STAT CHEM 8, ED - Abnormal; Notable for the following components:   Glucose, Bld 158 (*)    Calcium, Ion 1.13 (*)    Hemoglobin 11.6 (*)    HCT 34.0 (*)    All other components within normal limits  I-STAT ARTERIAL BLOOD GAS, ED - Abnormal; Notable for the following components:   pH, Arterial 7.302 (*)    pCO2 arterial 55.7 (*)    pO2, Arterial 530 (*)    Potassium 3.3 (*)    HCT 27.0 (*)    Hemoglobin 9.2 (*)    All other components within normal limits  CBG MONITORING, ED - Abnormal; Notable for the following components:   Glucose-Capillary 138 (*)    All other components within normal limits  RESP PANEL BY RT-PCR (FLU A&B, COVID) ARPGX2  ETHANOL  PROTIME-INR  BLOOD GAS, ARTERIAL  CBG MONITORING, ED  SAMPLE TO BLOOD BANK  ABO/RH    EKG None  Radiology DG Wrist Complete Left  Result Date: 11/04/2021 CLINICAL DATA:  Fall, seizures, left ulnar styloid bruising. EXAM: LEFT WRIST -  COMPLETE 3+ VIEW COMPARISON:  None. FINDINGS: There is diffusely decreased mineralization of the bones. A cortical regularity is seen at the ulnar styloid suggesting nondisplaced fracture. The remaining bony structures are intact and there is no dislocation. Mild  degenerative changes are present at the first carpometacarpal joint. Soft tissue swelling is present about the wrist. IMPRESSION: Nondisplaced ulnar styloid fracture. Electronically Signed   By: Brett Fairy M.D.   On: 11/04/2021 22:50   CT HEAD WO CONTRAST  Result Date: 11/04/2021 CLINICAL DATA:  Head trauma, mod-severe; Neck trauma (Age >= 65y); Facial trauma. Seizure, fall, respiratory failure EXAM: CT HEAD WITHOUT CONTRAST CT MAXILLOFACIAL WITHOUT CONTRAST CT CERVICAL SPINE WITHOUT CONTRAST TECHNIQUE: Multidetector CT imaging of the head, cervical spine, and maxillofacial structures were performed using the standard protocol without intravenous contrast. Multiplanar CT image reconstructions of the cervical spine and maxillofacial structures were also generated. COMPARISON:  CT head 07/06/2021 FINDINGS: CT HEAD FINDINGS Brain: Right frontal ventricular shunt catheter abutting the septum pellucidum is unchanged. Extensive periventricular white matter changes are present, stable since prior examination, nonspecific. This may reflect the sequela of small vessel ischemia or demyelinating disease. Mild ventriculomegaly is unchanged possibly representing the sequela of central atrophy. Remote lacunar infarct noted within the right thalamus. No evidence of acute intracranial hemorrhage or infarct. No abnormal mass effect or midline shift. No abnormal intra or extra-axial mass lesion. Cerebellum is unremarkable. Vascular: No asymmetric hyperdense vasculature at the skull base. Skull: No acute fracture Other: Mastoid air cells and middle ear cavities are clear. CT MAXILLOFACIAL FINDINGS Osseous: No fracture or mandibular dislocation. No destructive process.  Orbits: Negative. No traumatic or inflammatory finding. Sinuses: Clear. Soft tissues: Facial soft tissues are unremarkable. Endotracheal tube and nasogastric tube are visualized. CT CERVICAL SPINE FINDINGS Alignment: Normal. Skull base and vertebrae: Craniocervical alignment is normal. There is prominent enthesophyte formation at the level of the apical ligament at the craniocervical junction. Atlantal dental interval is not widened. There is no acute fracture of the cervical spine. Soft tissues and spinal canal: The spinal canal is widely patent. No canal hematoma. No prevertebral soft tissue swelling or fluid collections identified. No pathologic adenopathy within the cervical soft tissues. Visualized thyroid is unremarkable. Disc levels: There is intervertebral disc space narrowing at C5-C7 with associated endplate remodeling in keeping with changes of moderate degenerative disc disease. The prevertebral soft tissues are not thickened on sagittal reformats. Review of the axial images demonstrates mild to moderate bilateral neuroforaminal narrowing at C5-6 and C6-7 secondary to uncovertebral arthrosis mild bilateral neuroforaminal narrowing also noted at C7-T1. Upper chest: Emphysema noted. Other: None IMPRESSION: No acute intracranial injury.  No calvarial fracture. No acute facial fracture or mandibular dislocation. No acute fracture or listhesis of the cervical spine. Emphysema (ICD10-J43.9). These results were called by telephone at the time of interpretation on 11/04/2021 at 10:42 pm to provider Redmond Pulling, MD, who verbally acknowledged these results. Electronically Signed   By: Fidela Salisbury M.D.   On: 11/04/2021 22:45   CT CERVICAL SPINE WO CONTRAST  Result Date: 11/04/2021 CLINICAL DATA:  Head trauma, mod-severe; Neck trauma (Age >= 65y); Facial trauma. Seizure, fall, respiratory failure EXAM: CT HEAD WITHOUT CONTRAST CT MAXILLOFACIAL WITHOUT CONTRAST CT CERVICAL SPINE WITHOUT CONTRAST TECHNIQUE:  Multidetector CT imaging of the head, cervical spine, and maxillofacial structures were performed using the standard protocol without intravenous contrast. Multiplanar CT image reconstructions of the cervical spine and maxillofacial structures were also generated. COMPARISON:  CT head 07/06/2021 FINDINGS: CT HEAD FINDINGS Brain: Right frontal ventricular shunt catheter abutting the septum pellucidum is unchanged. Extensive periventricular white matter changes are present, stable since prior examination, nonspecific. This may reflect the sequela of small vessel ischemia or demyelinating disease. Mild ventriculomegaly is unchanged  possibly representing the sequela of central atrophy. Remote lacunar infarct noted within the right thalamus. No evidence of acute intracranial hemorrhage or infarct. No abnormal mass effect or midline shift. No abnormal intra or extra-axial mass lesion. Cerebellum is unremarkable. Vascular: No asymmetric hyperdense vasculature at the skull base. Skull: No acute fracture Other: Mastoid air cells and middle ear cavities are clear. CT MAXILLOFACIAL FINDINGS Osseous: No fracture or mandibular dislocation. No destructive process. Orbits: Negative. No traumatic or inflammatory finding. Sinuses: Clear. Soft tissues: Facial soft tissues are unremarkable. Endotracheal tube and nasogastric tube are visualized. CT CERVICAL SPINE FINDINGS Alignment: Normal. Skull base and vertebrae: Craniocervical alignment is normal. There is prominent enthesophyte formation at the level of the apical ligament at the craniocervical junction. Atlantal dental interval is not widened. There is no acute fracture of the cervical spine. Soft tissues and spinal canal: The spinal canal is widely patent. No canal hematoma. No prevertebral soft tissue swelling or fluid collections identified. No pathologic adenopathy within the cervical soft tissues. Visualized thyroid is unremarkable. Disc levels: There is intervertebral disc  space narrowing at C5-C7 with associated endplate remodeling in keeping with changes of moderate degenerative disc disease. The prevertebral soft tissues are not thickened on sagittal reformats. Review of the axial images demonstrates mild to moderate bilateral neuroforaminal narrowing at C5-6 and C6-7 secondary to uncovertebral arthrosis mild bilateral neuroforaminal narrowing also noted at C7-T1. Upper chest: Emphysema noted. Other: None IMPRESSION: No acute intracranial injury.  No calvarial fracture. No acute facial fracture or mandibular dislocation. No acute fracture or listhesis of the cervical spine. Emphysema (ICD10-J43.9). These results were called by telephone at the time of interpretation on 11/04/2021 at 10:42 pm to provider Redmond Pulling, MD, who verbally acknowledged these results. Electronically Signed   By: Fidela Salisbury M.D.   On: 11/04/2021 22:45   DG Pelvis Portable  Result Date: 11/04/2021 CLINICAL DATA:  Fall. EXAM: PORTABLE PELVIS 1-2 VIEWS COMPARISON:  None. FINDINGS: No acute fracture or dislocation. Mild lower lumbar degenerative changes. A catheter is partially visualized over the pelvis. The soft tissues are unremarkable. IMPRESSION: No acute findings. Electronically Signed   By: Anner Crete M.D.   On: 11/04/2021 22:00   DG Chest Port 1 View  Addendum Date: 11/04/2021   ADDENDUM REPORT: 11/04/2021 22:20 ADDENDUM: These results were called by telephone at the time of interpretation on 11/04/2021 at 10:07 pm to provider [facility] , who verbally acknowledged these results. Electronically Signed   By: Ronney Asters M.D.   On: 11/04/2021 22:20   Result Date: 11/04/2021 CLINICAL DATA:  Trauma, fall. EXAM: PORTABLE CHEST 1 VIEW COMPARISON:  Chest x-ray 06/12/2021. FINDINGS: There is right mainstem intubation 7 mm. Enteric tube extends below the diaphragm. Right-sided central venous catheter tip projects over the mid SVC. The cardiomediastinal silhouette appears stable. Left suprahilar  density and surgical clips are again noted. Cardiac silhouette within normal limits. There is no pleural effusion or pneumothorax identified. There is some strandy opacities in the left lung base. No acute fractures are seen. IMPRESSION: 1. Right mainstem intubation.  Recommend retraction 4 cm. 2. Enteric tube extends below the diaphragm. 3. Minimal left basilar atelectasis/airspace disease. 4. Stable chronic and postsurgical changes in the left suprahilar region. Electronically Signed: By: Ronney Asters M.D. On: 11/04/2021 22:02   CT MAXILLOFACIAL WO CONTRAST  Result Date: 11/04/2021 CLINICAL DATA:  Head trauma, mod-severe; Neck trauma (Age >= 65y); Facial trauma. Seizure, fall, respiratory failure EXAM: CT HEAD WITHOUT CONTRAST CT MAXILLOFACIAL WITHOUT CONTRAST  CT CERVICAL SPINE WITHOUT CONTRAST TECHNIQUE: Multidetector CT imaging of the head, cervical spine, and maxillofacial structures were performed using the standard protocol without intravenous contrast. Multiplanar CT image reconstructions of the cervical spine and maxillofacial structures were also generated. COMPARISON:  CT head 07/06/2021 FINDINGS: CT HEAD FINDINGS Brain: Right frontal ventricular shunt catheter abutting the septum pellucidum is unchanged. Extensive periventricular white matter changes are present, stable since prior examination, nonspecific. This may reflect the sequela of small vessel ischemia or demyelinating disease. Mild ventriculomegaly is unchanged possibly representing the sequela of central atrophy. Remote lacunar infarct noted within the right thalamus. No evidence of acute intracranial hemorrhage or infarct. No abnormal mass effect or midline shift. No abnormal intra or extra-axial mass lesion. Cerebellum is unremarkable. Vascular: No asymmetric hyperdense vasculature at the skull base. Skull: No acute fracture Other: Mastoid air cells and middle ear cavities are clear. CT MAXILLOFACIAL FINDINGS Osseous: No fracture or  mandibular dislocation. No destructive process. Orbits: Negative. No traumatic or inflammatory finding. Sinuses: Clear. Soft tissues: Facial soft tissues are unremarkable. Endotracheal tube and nasogastric tube are visualized. CT CERVICAL SPINE FINDINGS Alignment: Normal. Skull base and vertebrae: Craniocervical alignment is normal. There is prominent enthesophyte formation at the level of the apical ligament at the craniocervical junction. Atlantal dental interval is not widened. There is no acute fracture of the cervical spine. Soft tissues and spinal canal: The spinal canal is widely patent. No canal hematoma. No prevertebral soft tissue swelling or fluid collections identified. No pathologic adenopathy within the cervical soft tissues. Visualized thyroid is unremarkable. Disc levels: There is intervertebral disc space narrowing at C5-C7 with associated endplate remodeling in keeping with changes of moderate degenerative disc disease. The prevertebral soft tissues are not thickened on sagittal reformats. Review of the axial images demonstrates mild to moderate bilateral neuroforaminal narrowing at C5-6 and C6-7 secondary to uncovertebral arthrosis mild bilateral neuroforaminal narrowing also noted at C7-T1. Upper chest: Emphysema noted. Other: None IMPRESSION: No acute intracranial injury.  No calvarial fracture. No acute facial fracture or mandibular dislocation. No acute fracture or listhesis of the cervical spine. Emphysema (ICD10-J43.9). These results were called by telephone at the time of interpretation on 11/04/2021 at 10:42 pm to provider Redmond Pulling, MD, who verbally acknowledged these results. Electronically Signed   By: Fidela Salisbury M.D.   On: 11/04/2021 22:45    Procedures Date/Time: 11/04/2021 10:46 PM Performed by: Idamae Lusher, MD Pre-anesthesia Checklist: Patient identified, Emergency Drugs available, Suction available, Timeout performed and Patient being monitored Oxygen Delivery Method:  Ambu bag Preoxygenation: Pre-oxygenation with 100% oxygen Induction Type: Rapid sequence Ventilation: Mask ventilation without difficulty Laryngoscope Size: Mac and 3 Tube size: 7.5 mm Number of attempts: 1 Airway Equipment and Method: Rigid stylet Placement Confirmation: ETT inserted through vocal cords under direct vision Secured at: 25 cm Tube secured with: ETT holder Dental Injury: Teeth and Oropharynx as per pre-operative assessment  Difficulty Due To: Difficulty was anticipated      Medications Ordered in ED Medications  etomidate (AMIDATE) injection (20 mg Intravenous Given 11/04/21 2139)  succinylcholine (ANECTINE) injection (150 mg Intravenous Given 11/04/21 2139)  propofol (DIPRIVAN) 1000 MG/100ML infusion (30 mcg/kg/min  52.2 kg Intravenous Rate/Dose Change 11/04/21 2159)  charcoal activated (NO SORBITOL) (ACTIDOSE-AQUA) suspension 50 g (has no administration in time range)  rocuronium (ZEMURON) injection (50 mg Intravenous Given 11/04/21 2204)  midazolam (VERSED) 100 mg/100 mL (1 mg/mL) premix infusion (has no administration in time range)  levETIRAcetam (KEPPRA) IVPB 1000 mg/100 mL premix (0 mg  Intravenous Stopped 11/04/21 2205)    Followed by  levETIRAcetam (KEPPRA) IVPB 500 mg/100 mL premix (0 mg Intravenous Stopped 11/04/21 2220)  sodium chloride 0.9 % bolus 1,000 mL (1,000 mLs Intravenous New Bag/Given 11/04/21 2319)    ED Course  I have reviewed the triage vital signs and the nursing notes.  Pertinent labs & imaging results that were available during my care of the patient were reviewed by me and considered in my medical decision making (see chart for details).    MDM Rules/Calculators/A&P                          75 year old female with history of dementia presenting with altered mental status.  Found down by family with ecchymosis around the left eye.  She is on Plavix and has history of VP shunt.  She is normally alert and oriented to self, but interacts.  Did  have a witnessed seizure by EMS.  She is unresponsive and combative on arrival.  Receiving assisted ventilations with BVM.  Patient was intubated for airway protection in the setting of suspected head injury.  Trauma imaging obtained.  CT head showed no acute intracranial abnormality.  CT C-spine negative.  Labs remarkable for lactic acid of 9.  Likely elevated in the setting of seizure.  She is afebrile.  Low concern for sepsis.  She was given IV fluids.  Talk screen negative.  Electrolytes reassuring.  Creatinine normal.  White count mildly elevated, but likely nonspecific.  May be reactive in the setting of seizure.  Collateral obtained from family.  There is doses of Wellbutrin missing from the patient's follow-up.  They estimate 1500 mg.  Concerns for Wellbutrin toxicity, especially in the setting of new seizures.  EKG obtained.  Patient given ns bolus.  Poison control contacted.  Recommend charcoal and monitoring for further seizures, EKG changes.  If so recommend bicarb boluses.  Patient will require every hour glucose checks.  Neurology consulted.  Recommend EEG in house.  Will continue to follow.  Critical care team contacted for admission.  Hand off given.  Final Clinical Impression(s) / ED Diagnoses Final diagnoses:  Trauma    Rx / DC Orders ED Discharge Orders     None        Idamae Lusher, MD 11/05/21 [place]    Elnora Morrison, MD 11/06/21 [phone]

## 2021-11-04 NOTE — ED Triage Notes (Signed)
Pt BIB [facility] as a Level 1 Trauma activation for evaluation after a fall. Pt was found by a friend who typically comes to stay with her at night. Friend found her on the floor between bed and nightstand. Friend and son assisted pt back into bed, and called a paramedic friend who came to the house to assess the pt. Family felt pt had slurred speech and worsening L sided deficits (hx of L sided deficits from prev CVA), and activated 911. On EMS arrival, pt remained w/ slurred speech and L sided deficits. Once placed on cot, pt w/ witnessed 90 second self limiting seizure, tonic clonic in nature. Pt w/ GCS of 3 after seizure. EMS then placed NPA and assisted ventilations. Pt remained w/ GCS of 3 en route to ED. On arrival to ED, pt was intubated for airway protection.

## 2021-11-04 NOTE — Progress Notes (Signed)
Chaplain responded to page;met family outside pt's room.  Chaplain learned pt's daughter is on a cruise, woman who found pt down is one of her caretakers, 2 women present with pt are friends of the daughter who were asked to look after the mother while daughter away.  Escorted family to entrance for wrist bands and back to [employer].  Chaplain notified medical team family members present.  Escorted family members back to pt's  room.  Chaplain standing by for additional support as needed.  [place]

## 2021-11-05 ENCOUNTER — Inpatient Hospital Stay (HOSPITAL_COMMUNITY): Payer: Medicare PPO

## 2021-11-05 DIAGNOSIS — J9600 Acute respiratory failure, unspecified whether with hypoxia or hypercapnia: Principal | ICD-10-CM | POA: Diagnosis present

## 2021-11-05 DIAGNOSIS — R4182 Altered mental status, unspecified: Secondary | ICD-10-CM

## 2021-11-05 DIAGNOSIS — B952 Enterococcus as the cause of diseases classified elsewhere: Secondary | ICD-10-CM | POA: Diagnosis present

## 2021-11-05 DIAGNOSIS — R627 Adult failure to thrive: Secondary | ICD-10-CM | POA: Diagnosis present

## 2021-11-05 DIAGNOSIS — R569 Unspecified convulsions: Secondary | ICD-10-CM | POA: Diagnosis not present

## 2021-11-05 DIAGNOSIS — Z85118 Personal history of other malignant neoplasm of bronchus and lung: Secondary | ICD-10-CM | POA: Diagnosis not present

## 2021-11-05 DIAGNOSIS — J439 Emphysema, unspecified: Secondary | ICD-10-CM | POA: Diagnosis present

## 2021-11-05 DIAGNOSIS — M25512 Pain in left shoulder: Secondary | ICD-10-CM | POA: Diagnosis present

## 2021-11-05 DIAGNOSIS — G40909 Epilepsy, unspecified, not intractable, without status epilepticus: Secondary | ICD-10-CM | POA: Diagnosis present

## 2021-11-05 DIAGNOSIS — Z9981 Dependence on supplemental oxygen: Secondary | ICD-10-CM | POA: Diagnosis not present

## 2021-11-05 DIAGNOSIS — Z20822 Contact with and (suspected) exposure to covid-19: Secondary | ICD-10-CM | POA: Diagnosis present

## 2021-11-05 DIAGNOSIS — Z982 Presence of cerebrospinal fluid drainage device: Secondary | ICD-10-CM | POA: Diagnosis not present

## 2021-11-05 DIAGNOSIS — I69354 Hemiplegia and hemiparesis following cerebral infarction affecting left non-dominant side: Secondary | ICD-10-CM | POA: Diagnosis not present

## 2021-11-05 DIAGNOSIS — Z86711 Personal history of pulmonary embolism: Secondary | ICD-10-CM | POA: Diagnosis not present

## 2021-11-05 DIAGNOSIS — I959 Hypotension, unspecified: Secondary | ICD-10-CM | POA: Diagnosis present

## 2021-11-05 DIAGNOSIS — Y92009 Unspecified place in unspecified non-institutional (private) residence as the place of occurrence of the external cause: Secondary | ICD-10-CM | POA: Diagnosis not present

## 2021-11-05 DIAGNOSIS — N390 Urinary tract infection, site not specified: Secondary | ICD-10-CM | POA: Diagnosis present

## 2021-11-05 DIAGNOSIS — E119 Type 2 diabetes mellitus without complications: Secondary | ICD-10-CM | POA: Diagnosis present

## 2021-11-05 DIAGNOSIS — W1830XA Fall on same level, unspecified, initial encounter: Secondary | ICD-10-CM | POA: Diagnosis present

## 2021-11-05 DIAGNOSIS — F32A Depression, unspecified: Secondary | ICD-10-CM | POA: Diagnosis present

## 2021-11-05 DIAGNOSIS — Z86718 Personal history of other venous thrombosis and embolism: Secondary | ICD-10-CM | POA: Diagnosis not present

## 2021-11-05 DIAGNOSIS — N319 Neuromuscular dysfunction of bladder, unspecified: Secondary | ICD-10-CM | POA: Diagnosis present

## 2021-11-05 DIAGNOSIS — Z681 Body mass index (BMI) 19 or less, adult: Secondary | ICD-10-CM | POA: Diagnosis not present

## 2021-11-05 DIAGNOSIS — S52615A Nondisplaced fracture of left ulna styloid process, initial encounter for closed fracture: Secondary | ICD-10-CM | POA: Diagnosis present

## 2021-11-05 DIAGNOSIS — E43 Unspecified severe protein-calorie malnutrition: Secondary | ICD-10-CM | POA: Diagnosis present

## 2021-11-05 DIAGNOSIS — J9621 Acute and chronic respiratory failure with hypoxia: Secondary | ICD-10-CM | POA: Diagnosis present

## 2021-11-05 DIAGNOSIS — G40409 Other generalized epilepsy and epileptic syndromes, not intractable, without status epilepticus: Secondary | ICD-10-CM | POA: Diagnosis present

## 2021-11-05 DIAGNOSIS — F03A4 Unspecified dementia, mild, with anxiety: Secondary | ICD-10-CM | POA: Diagnosis present

## 2021-11-05 DIAGNOSIS — T1490XA Injury, unspecified, initial encounter: Secondary | ICD-10-CM | POA: Diagnosis not present

## 2021-11-05 DIAGNOSIS — D509 Iron deficiency anemia, unspecified: Secondary | ICD-10-CM | POA: Diagnosis present

## 2021-11-05 DIAGNOSIS — I10 Essential (primary) hypertension: Secondary | ICD-10-CM | POA: Diagnosis present

## 2021-11-05 HISTORY — DX: Acute respiratory failure, unspecified whether with hypoxia or hypercapnia: J96.00

## 2021-11-05 LAB — CBC
HCT: 24.9 % — ABNORMAL LOW (ref 36.0–46.0)
Hemoglobin: 7.5 g/dL — ABNORMAL LOW (ref 12.0–15.0)
MCH: 28.2 pg (ref 26.0–34.0)
MCHC: 30.1 g/dL (ref 30.0–36.0)
MCV: 93.6 fL (ref 80.0–100.0)
Platelets: 174 10*3/uL (ref 150–400)
RBC: 2.66 MIL/uL — ABNORMAL LOW (ref 3.87–5.11)
RDW: 13.2 % (ref 11.5–15.5)
WBC: 10.4 10*3/uL (ref 4.0–10.5)
nRBC: 0 % (ref 0.0–0.2)

## 2021-11-05 LAB — MAGNESIUM: Magnesium: 1.9 mg/dL (ref 1.7–2.4)

## 2021-11-05 LAB — GLUCOSE, CAPILLARY
Glucose-Capillary: 102 mg/dL — ABNORMAL HIGH (ref 70–99)
Glucose-Capillary: 112 mg/dL — ABNORMAL HIGH (ref 70–99)
Glucose-Capillary: 114 mg/dL — ABNORMAL HIGH (ref 70–99)
Glucose-Capillary: 117 mg/dL — ABNORMAL HIGH (ref 70–99)
Glucose-Capillary: 117 mg/dL — ABNORMAL HIGH (ref 70–99)
Glucose-Capillary: 131 mg/dL — ABNORMAL HIGH (ref 70–99)
Glucose-Capillary: 157 mg/dL — ABNORMAL HIGH (ref 70–99)
Glucose-Capillary: 174 mg/dL — ABNORMAL HIGH (ref 70–99)
Glucose-Capillary: 181 mg/dL — ABNORMAL HIGH (ref 70–99)
Glucose-Capillary: 95 mg/dL (ref 70–99)

## 2021-11-05 LAB — LACTIC ACID, PLASMA: Lactic Acid, Venous: 2.3 mmol/L (ref 0.5–1.9)

## 2021-11-05 LAB — BASIC METABOLIC PANEL
Anion gap: 8 (ref 5–15)
BUN: 11 mg/dL (ref 8–23)
CO2: 24 mmol/L (ref 22–32)
Calcium: 8 mg/dL — ABNORMAL LOW (ref 8.9–10.3)
Chloride: 103 mmol/L (ref 98–111)
Creatinine, Ser: 0.82 mg/dL (ref 0.44–1.00)
GFR, Estimated: >60 mL/min (ref >60)
Glucose, Bld: 167 mg/dL — ABNORMAL HIGH (ref 70–99)
Potassium: 3.4 mmol/L — ABNORMAL LOW (ref 3.5–5.1)
Sodium: 135 mmol/L (ref 135–145)

## 2021-11-05 LAB — CBG MONITORING, ED: Glucose-Capillary: 162 mg/dL — ABNORMAL HIGH (ref 70–99)

## 2021-11-05 LAB — MRSA NEXT GEN BY PCR, NASAL: MRSA by PCR Next Gen: NOT DETECTED

## 2021-11-05 MED ORDER — ONDANSETRON HCL 4 MG/2ML IJ SOLN: 4.0000 mg | Freq: Four times a day (QID) | INTRAMUSCULAR | Status: DC | PRN

## 2021-11-05 MED ORDER — LEVETIRACETAM IN NACL 500 MG/100ML IV SOLN
500.0000 mg | Freq: Two times a day (BID) | INTRAVENOUS | Status: DC
  Administered 2021-11-05 – 2021-11-06 (×4): 500 mg via INTRAVENOUS
  Filled 2021-11-05 (×4): qty 100

## 2021-11-05 MED ORDER — DOCUSATE SODIUM 50 MG/5ML PO LIQD: 100.0000 mg | Freq: Two times a day (BID) | ORAL | Status: DC

## 2021-11-05 MED ORDER — FENTANYL 2500MCG IN NS 250ML (10MCG/ML) PREMIX INFUSION
25.0000 ug/h | INTRAVENOUS | Status: DC
  Administered 2021-11-05: 25 ug/h via INTRAVENOUS
  Filled 2021-11-05: qty 250

## 2021-11-05 MED ORDER — POLYETHYLENE GLYCOL 3350 17 G PO PACK
17.0000 g | PACK | Freq: Every day | ORAL | Status: DC
  Administered 2021-11-06 – 2021-11-12 (×7): 17 g via ORAL
  Filled 2021-11-05 (×7): qty 1

## 2021-11-05 MED ORDER — DOCUSATE SODIUM 50 MG/5ML PO LIQD: 100.0000 mg | Freq: Two times a day (BID) | ORAL | Status: DC | PRN

## 2021-11-05 MED ORDER — POTASSIUM CHLORIDE 20 MEQ PO PACK
PACK | ORAL | Status: AC
  Filled 2021-11-05: qty 2

## 2021-11-05 MED ORDER — ACETAMINOPHEN 160 MG/5ML PO SOLN
ORAL | Status: AC
  Filled 2021-11-05: qty 20.3

## 2021-11-05 MED ORDER — NOREPINEPHRINE 4 MG/250ML-% IV SOLN: 0.0000 ug/min | INTRAVENOUS | Status: DC

## 2021-11-05 MED ORDER — DOCUSATE SODIUM 100 MG PO CAPS
100.0000 mg | ORAL_CAPSULE | Freq: Two times a day (BID) | ORAL | Status: DC | PRN
  Administered 2021-11-07: 100 mg via ORAL

## 2021-11-05 MED ORDER — ACETAMINOPHEN 325 MG PO TABS
650.0000 mg | ORAL_TABLET | ORAL | Status: DC | PRN
  Administered 2021-11-06 – 2021-11-12 (×7): 650 mg via ORAL
  Filled 2021-11-05 (×7): qty 2

## 2021-11-05 MED ORDER — DOCUSATE SODIUM 100 MG PO CAPS
100.0000 mg | ORAL_CAPSULE | Freq: Two times a day (BID) | ORAL | Status: DC
  Administered 2021-11-06 – 2021-11-08 (×4): 100 mg via ORAL
  Filled 2021-11-05 (×5): qty 1

## 2021-11-05 MED ORDER — ORAL CARE MOUTH RINSE
15.0000 mL | OROMUCOSAL | Status: DC
  Administered 2021-11-05 – 2021-11-06 (×6): 15 mL via OROMUCOSAL

## 2021-11-05 MED ORDER — POLYETHYLENE GLYCOL 3350 17 G PO PACK: 17.0000 g | PACK | Freq: Every day | ORAL | Status: DC | PRN

## 2021-11-05 MED ORDER — PANTOPRAZOLE SODIUM 40 MG IV SOLR: 40.0000 mg | Freq: Every day | INTRAVENOUS | Status: DC

## 2021-11-05 MED ORDER — ACETAMINOPHEN 160 MG/5ML PO SOLN
650.0000 mg | ORAL | Status: DC | PRN
  Administered 2021-11-05: 650 mg

## 2021-11-05 MED ORDER — POLYETHYLENE GLYCOL 3350 17 G PO PACK: 17.0000 g | PACK | Freq: Every day | ORAL | Status: DC

## 2021-11-05 MED ORDER — CHLORHEXIDINE GLUCONATE 0.12% ORAL RINSE (MEDLINE KIT)
15.0000 mL | Freq: Two times a day (BID) | OROMUCOSAL | Status: DC
  Administered 2021-11-05 (×2): 15 mL via OROMUCOSAL

## 2021-11-05 MED ORDER — SODIUM CHLORIDE 0.9 % IV BOLUS
1000.0000 mL | Freq: Once | INTRAVENOUS | Status: AC
  Administered 2021-11-05: 1000 mL via INTRAVENOUS

## 2021-11-05 MED ORDER — CHLORHEXIDINE GLUCONATE CLOTH 2 % EX PADS
6.0000 | MEDICATED_PAD | Freq: Every day | CUTANEOUS | Status: DC
Start: 2021-11-05 — End: 2021-11-09
  Administered 2021-11-06 – 2021-11-08 (×2): 6 via TOPICAL

## 2021-11-05 MED ORDER — IPRATROPIUM-ALBUTEROL 0.5-2.5 (3) MG/3ML IN SOLN
3.0000 mL | Freq: Four times a day (QID) | RESPIRATORY_TRACT | Status: DC | PRN
  Administered 2021-11-05 – 2021-11-08 (×2): 3 mL via RESPIRATORY_TRACT
  Filled 2021-11-05 (×2): qty 3

## 2021-11-05 MED ORDER — POTASSIUM CHLORIDE 20 MEQ PO PACK
40.0000 meq | PACK | Freq: Once | ORAL | Status: AC
  Administered 2021-11-05: 40 meq

## 2021-11-05 MED ORDER — MIDAZOLAM-SODIUM CHLORIDE 100-0.9 MG/100ML-% IV SOLN
0.5000 mg/h | INTRAVENOUS | Status: DC
  Administered 2021-11-05: 2 mg/h via INTRAVENOUS
  Filled 2021-11-05: qty 100

## 2021-11-05 MED ORDER — PANTOPRAZOLE SODIUM 40 MG IV SOLR
40.0000 mg | Freq: Every day | INTRAVENOUS | Status: DC
  Administered 2021-11-05 (×2): 40 mg via INTRAVENOUS
  Filled 2021-11-05 (×2): qty 40

## 2021-11-05 MED ORDER — SODIUM CHLORIDE 0.9 % IV SOLN
250.0000 mL | INTRAVENOUS | Status: DC
  Administered 2021-11-05 – 2021-11-06 (×2): 250 mL via INTRAVENOUS

## 2021-11-05 MED ORDER — FENTANYL BOLUS VIA INFUSION
25.0000 ug | INTRAVENOUS | Status: DC | PRN
  Administered 2021-11-05: 25 ug via INTRAVENOUS
  Filled 2021-11-05: qty 100

## 2021-11-05 MED ORDER — FENTANYL CITRATE PF 50 MCG/ML IJ SOSY: 25.0000 ug | PREFILLED_SYRINGE | Freq: Once | INTRAMUSCULAR | Status: DC

## 2021-11-05 NOTE — Progress Notes (Signed)
Attempted bilateral forearm USG PIV insertion without success. Pt. agitated and moving around and infiltrated both sites while trying to thread catheters. RN at bedside and aware. Will consult if additional access is needed. Will continue to monitor.

## 2021-11-05 NOTE — TOC CAGE-AID Note (Signed)
Transition of Care [facility]) - CAGE-AID Screening   Patient Details  Name: LESA VANDALL MRN: [id number] Date of Birth: [birth year]  Transition of Care [facility] CM/SW Contact:    Wynonia Medero C Tarpley-Carter, [place] Phone Number: 11/05/2021, 9:36 AM   Clinical Narrative: Pt is unable to participate in Cage Aid.  Cristiano Capri Tarpley-Carter, MSW, LCSW-A Pronouns:  She/Her/Hers [facility] Clinical Social Worker Direct Number:  [phone] [email or web address]  CAGE-AID Screening: Substance Abuse Screening unable to be completed due to: : Patient unable to participate             Substance Abuse Education Offered: No

## 2021-11-05 NOTE — Procedures (Signed)
Patient Name: Amanda Porter  MRN: [id number]  Epilepsy Attending: Lora Havens  Referring Physician/Provider: Dr Rosalin Hawking Duration: 11/05/2021 0221 to 11/06/2021 0221   Patient history: 75 year old female presented with generalized tonic-clonic seizure-like episode.  EEG to evaluate for seizure.   Level of alertness: lethargic    AEDs during EEG study: LEV, Propofol, versed   Technical aspects: This EEG study was done with scalp electrodes positioned according to the 10-20 International system of electrode placement. Electrical activity was acquired at a sampling rate of 500Hz  and reviewed with a high frequency filter of 70Hz  and a low frequency filter of 1Hz . EEG data were recorded continuously and digitally stored.    Description: No clear posterior dominant rhythm was seen. Sleep was characterized by sleep spindles (12 to 14 Hz), maximum frontocentral region.  EEG showed continuous generalized 3 to 6 Hz theta-delta slowing admixed with 15 to 18 Hz generalized beta activity intermittently. Hyperventilation and photic stimulation were not performed.     Part of the study was technically difficult due to significant electrode artifact.   ABNORMALITY - Continuous slow, generalized   IMPRESSION: This study is suggestive of moderate to severe diffuse encephalopathy, nonspecific etiology. No seizures or definite epileptiform discharges were seen throughout the recording.   Carsynn Bethune Barbra Sarks

## 2021-11-05 NOTE — Procedures (Signed)
Patient Name: MAGDELINE PRANGE  MRN: [id number]  Epilepsy Attending: Lora Havens  Referring Physician/Provider: Dr Rosalin Hawking Date: 11/04/2021  Duration: 34.47 mins  Patient history: 75 year old female presented with generalized tonic-clonic seizure-like episode.  EEG to evaluate for seizure.  Level of alertness: lethargic   AEDs during EEG study: LEV  Technical aspects: This EEG study was done with scalp electrodes positioned according to the 10-20 International system of electrode placement. Electrical activity was acquired at a sampling rate of 500Hz  and reviewed with a high frequency filter of 70Hz  and a low frequency filter of 1Hz . EEG data were recorded continuously and digitally stored.   Description: No clear posterior dominant rhythm was seen.  EEG showed continuous generalized 3 to 6 Hz theta-delta slowing.  Hyperventilation and photic stimulation were not performed.     ABNORMALITY - Continuous slow, generalized  IMPRESSION: This study is suggestive of moderate to severe diffuse encephalopathy, nonspecific etiology. No seizures or definite epileptiform discharges were seen throughout the recording.  Kasondra Junod Barbra Sarks

## 2021-11-05 NOTE — Consult Note (Signed)
Neurology Consultation Note  Consult Requested by: Dr. Reather Converse  Reason for Consult: seizure  Consult Date: 11/05/21   The history was obtained from the family member.  During history and examination, all items were able to obtain unless otherwise noted.  History of Present Illness:  Amanda Porter is a 75 y.o. African American female with PMH of hypertension, diabetes, hyperlipidemia, dementia, NPH status post VP shunt in 12/2016, stroke with mild left-sided residual, COPD on home oxygen, anxiety, tardive dyskinesia presented to ED after fall and witnessed seizure.  Per family, her daughter out of town and she was seen from [place] at baseline walking around with walker around 4:30 PM.  Then around 8 PM she was found down by her caregiver between bed and recliner.  She was put into chair, able to open eyes briefly, slurring words, intangible words, left eye with bleeding from the corner of the eye as well as subconjunctival hemorrhage. Left knee contusion.  Glucose 176.  BP was 170s with EMS.  Later, she was found to have a tonic-clonic seizure with facial and arm and leg twitching lasting about 1 minute, resolved by herself, no medication was administrated.  Post seizure, patient was postictal, somnolent, and she was intubated for airway protection.  Trauma alerted, work-up was CT head, C-spine and maxillofacial all negative.  She was loaded with Keppra 1.5 g, and put on propofol and Versed.  Neurology was consulted for seizure management.  At baseline, per family, patient able to walk with walker slowly, no frequent falls.  Her daughter put all of her medication into the pillbox and she takes medication by herself.  However, on checking her pillbox today, all her medications from Friday to Wednesday gone, there was concerning that she took all her Monday Tuesday and Wednesday pills today.  The medication including BuSpar, Wellbutrin, Crestor, Plavix and glipizide.   Past Medical History:  Diagnosis  Date   CVA (cerebral vascular accident) ([place])    L sided deficits   Dementia ([place])    Depression    Diabetes mellitus ([place])    Type 2   Dysphagia    Tardive dyskinesia     Past Surgical History:  Procedure Laterality Date   VENTRICULOPERITONEAL SHUNT      History reviewed. No pertinent family history.  Social History:  reports that she has quit smoking. Her smoking use included cigarettes. She has never used smokeless tobacco. She reports that she does not currently use alcohol. She reports that she does not currently use drugs.  Allergies:  Allergies  Allergen Reactions   Lexapro [Escitalopram]    Neosporin [Bacitracin-Polymyxin B] Rash    No current facility-administered medications on file prior to encounter.   No current outpatient medications on file prior to encounter.    Review of Systems: A full ROS was attempted today and was able to be performed.  Systems assessed include - Constitutional, Eyes, HENT, Respiratory, Cardiovascular, Gastrointestinal, Genitourinary, Integument/breast, Hematologic/lymphatic, Musculoskeletal, Neurological, Behavioral/Psych, Endocrine, Allergic/Immunologic - with pertinent responses as per HPI.  Physical Examination:  Temp:  [97.1 F (36.2 C)-97.7 F (36.5 C)] 97.7 F (36.5 C) (12/05 2230) Pulse Rate:  [20-107] 81 (12/06 0014) Resp:  [20-26] 26 (12/06 0014) BP: (115-148)/(59-104) 126/59 (12/06 0014) SpO2:  [100 %] 100 % (12/06 0014) FiO2 (%):  [30 %-100 %] 30 % (12/06 0014) Weight:  [52.2 kg] 52.2 kg (12/05 2220)  General - Well nourished, well developed, intubated on sedation.  Ophthalmologic - fundi not visualized due to noncooperation.  Cardiovascular - Regular rate and rhythm.  Neuro - intubated on sedation, eyes briefly open on voice, not following commands. With forced eye opening, eyes in mid position, not blinking to visual threat, doll's eyes not able to perform due to on hard c-collar, not tracking, pupils 3 mm  bilaterally, right ptosis likely due to the ET tube fixation pad. Corneal reflex absent bilaterally, gag weakly present and cough absent. Breathing over the vent.  Facial symmetry not able to test due to ET tube.  Tongue protrusion not cooperative. On pain stimulation, mildly withdrawal in all extremities, seems symmetrical. DTR diminished and no babinski. Sensation, coordination and gait not tested.   Data Reviewed: DG Wrist Complete Left  Result Date: 11/04/2021 CLINICAL DATA:  Fall, seizures, left ulnar styloid bruising. EXAM: LEFT WRIST - COMPLETE 3+ VIEW COMPARISON:  None. FINDINGS: There is diffusely decreased mineralization of the bones. A cortical regularity is seen at the ulnar styloid suggesting nondisplaced fracture. The remaining bony structures are intact and there is no dislocation. Mild degenerative changes are present at the first carpometacarpal joint. Soft tissue swelling is present about the wrist. IMPRESSION: Nondisplaced ulnar styloid fracture. Electronically Signed   By: Brett Fairy M.D.   On: 11/04/2021 22:50   CT HEAD WO CONTRAST  Result Date: 11/04/2021 CLINICAL DATA:  Head trauma, mod-severe; Neck trauma (Age >= 65y); Facial trauma. Seizure, fall, respiratory failure EXAM: CT HEAD WITHOUT CONTRAST CT MAXILLOFACIAL WITHOUT CONTRAST CT CERVICAL SPINE WITHOUT CONTRAST TECHNIQUE: Multidetector CT imaging of the head, cervical spine, and maxillofacial structures were performed using the standard protocol without intravenous contrast. Multiplanar CT image reconstructions of the cervical spine and maxillofacial structures were also generated. COMPARISON:  CT head 07/06/2021 FINDINGS: CT HEAD FINDINGS Brain: Right frontal ventricular shunt catheter abutting the septum pellucidum is unchanged. Extensive periventricular white matter changes are present, stable since prior examination, nonspecific. This may reflect the sequela of small vessel ischemia or demyelinating disease. Mild  ventriculomegaly is unchanged possibly representing the sequela of central atrophy. Remote lacunar infarct noted within the right thalamus. No evidence of acute intracranial hemorrhage or infarct. No abnormal mass effect or midline shift. No abnormal intra or extra-axial mass lesion. Cerebellum is unremarkable. Vascular: No asymmetric hyperdense vasculature at the skull base. Skull: No acute fracture Other: Mastoid air cells and middle ear cavities are clear. CT MAXILLOFACIAL FINDINGS Osseous: No fracture or mandibular dislocation. No destructive process. Orbits: Negative. No traumatic or inflammatory finding. Sinuses: Clear. Soft tissues: Facial soft tissues are unremarkable. Endotracheal tube and nasogastric tube are visualized. CT CERVICAL SPINE FINDINGS Alignment: Normal. Skull base and vertebrae: Craniocervical alignment is normal. There is prominent enthesophyte formation at the level of the apical ligament at the craniocervical junction. Atlantal dental interval is not widened. There is no acute fracture of the cervical spine. Soft tissues and spinal canal: The spinal canal is widely patent. No canal hematoma. No prevertebral soft tissue swelling or fluid collections identified. No pathologic adenopathy within the cervical soft tissues. Visualized thyroid is unremarkable. Disc levels: There is intervertebral disc space narrowing at C5-C7 with associated endplate remodeling in keeping with changes of moderate degenerative disc disease. The prevertebral soft tissues are not thickened on sagittal reformats. Review of the axial images demonstrates mild to moderate bilateral neuroforaminal narrowing at C5-6 and C6-7 secondary to uncovertebral arthrosis mild bilateral neuroforaminal narrowing also noted at C7-T1. Upper chest: Emphysema noted. Other: None IMPRESSION: No acute intracranial injury.  No calvarial fracture. No acute facial fracture or mandibular dislocation. No  acute fracture or listhesis of the  cervical spine. Emphysema (ICD10-J43.9). These results were called by telephone at the time of interpretation on 11/04/2021 at 10:42 pm to provider Redmond Pulling, MD, who verbally acknowledged these results. Electronically Signed   By: Fidela Salisbury M.D.   On: 11/04/2021 22:45   CT CERVICAL SPINE WO CONTRAST  Result Date: 11/04/2021 CLINICAL DATA:  Head trauma, mod-severe; Neck trauma (Age >= 65y); Facial trauma. Seizure, fall, respiratory failure EXAM: CT HEAD WITHOUT CONTRAST CT MAXILLOFACIAL WITHOUT CONTRAST CT CERVICAL SPINE WITHOUT CONTRAST TECHNIQUE: Multidetector CT imaging of the head, cervical spine, and maxillofacial structures were performed using the standard protocol without intravenous contrast. Multiplanar CT image reconstructions of the cervical spine and maxillofacial structures were also generated. COMPARISON:  CT head 07/06/2021 FINDINGS: CT HEAD FINDINGS Brain: Right frontal ventricular shunt catheter abutting the septum pellucidum is unchanged. Extensive periventricular white matter changes are present, stable since prior examination, nonspecific. This may reflect the sequela of small vessel ischemia or demyelinating disease. Mild ventriculomegaly is unchanged possibly representing the sequela of central atrophy. Remote lacunar infarct noted within the right thalamus. No evidence of acute intracranial hemorrhage or infarct. No abnormal mass effect or midline shift. No abnormal intra or extra-axial mass lesion. Cerebellum is unremarkable. Vascular: No asymmetric hyperdense vasculature at the skull base. Skull: No acute fracture Other: Mastoid air cells and middle ear cavities are clear. CT MAXILLOFACIAL FINDINGS Osseous: No fracture or mandibular dislocation. No destructive process. Orbits: Negative. No traumatic or inflammatory finding. Sinuses: Clear. Soft tissues: Facial soft tissues are unremarkable. Endotracheal tube and nasogastric tube are visualized. CT CERVICAL SPINE FINDINGS Alignment:  Normal. Skull base and vertebrae: Craniocervical alignment is normal. There is prominent enthesophyte formation at the level of the apical ligament at the craniocervical junction. Atlantal dental interval is not widened. There is no acute fracture of the cervical spine. Soft tissues and spinal canal: The spinal canal is widely patent. No canal hematoma. No prevertebral soft tissue swelling or fluid collections identified. No pathologic adenopathy within the cervical soft tissues. Visualized thyroid is unremarkable. Disc levels: There is intervertebral disc space narrowing at C5-C7 with associated endplate remodeling in keeping with changes of moderate degenerative disc disease. The prevertebral soft tissues are not thickened on sagittal reformats. Review of the axial images demonstrates mild to moderate bilateral neuroforaminal narrowing at C5-6 and C6-7 secondary to uncovertebral arthrosis mild bilateral neuroforaminal narrowing also noted at C7-T1. Upper chest: Emphysema noted. Other: None IMPRESSION: No acute intracranial injury.  No calvarial fracture. No acute facial fracture or mandibular dislocation. No acute fracture or listhesis of the cervical spine. Emphysema (ICD10-J43.9). These results were called by telephone at the time of interpretation on 11/04/2021 at 10:42 pm to provider Redmond Pulling, MD, who verbally acknowledged these results. Electronically Signed   By: Fidela Salisbury M.D.   On: 11/04/2021 22:45   DG Pelvis Portable  Result Date: 11/04/2021 CLINICAL DATA:  Fall. EXAM: PORTABLE PELVIS 1-2 VIEWS COMPARISON:  None. FINDINGS: No acute fracture or dislocation. Mild lower lumbar degenerative changes. A catheter is partially visualized over the pelvis. The soft tissues are unremarkable. IMPRESSION: No acute findings. Electronically Signed   By: Anner Crete M.D.   On: 11/04/2021 22:00   DG Chest Port 1 View  Addendum Date: 11/04/2021   ADDENDUM REPORT: 11/04/2021 22:20 ADDENDUM: These results  were called by telephone at the time of interpretation on 11/04/2021 at 10:07 pm to provider [facility] , who verbally acknowledged these results. Electronically Signed  By: Ronney Asters M.D.   On: 11/04/2021 22:20   Result Date: 11/04/2021 CLINICAL DATA:  Trauma, fall. EXAM: PORTABLE CHEST 1 VIEW COMPARISON:  Chest x-ray 06/12/2021. FINDINGS: There is right mainstem intubation 7 mm. Enteric tube extends below the diaphragm. Right-sided central venous catheter tip projects over the mid SVC. The cardiomediastinal silhouette appears stable. Left suprahilar density and surgical clips are again noted. Cardiac silhouette within normal limits. There is no pleural effusion or pneumothorax identified. There is some strandy opacities in the left lung base. No acute fractures are seen. IMPRESSION: 1. Right mainstem intubation.  Recommend retraction 4 cm. 2. Enteric tube extends below the diaphragm. 3. Minimal left basilar atelectasis/airspace disease. 4. Stable chronic and postsurgical changes in the left suprahilar region. Electronically Signed: By: Ronney Asters M.D. On: 11/04/2021 22:02   CT MAXILLOFACIAL WO CONTRAST  Result Date: 11/04/2021 CLINICAL DATA:  Head trauma, mod-severe; Neck trauma (Age >= 65y); Facial trauma. Seizure, fall, respiratory failure EXAM: CT HEAD WITHOUT CONTRAST CT MAXILLOFACIAL WITHOUT CONTRAST CT CERVICAL SPINE WITHOUT CONTRAST TECHNIQUE: Multidetector CT imaging of the head, cervical spine, and maxillofacial structures were performed using the standard protocol without intravenous contrast. Multiplanar CT image reconstructions of the cervical spine and maxillofacial structures were also generated. COMPARISON:  CT head 07/06/2021 FINDINGS: CT HEAD FINDINGS Brain: Right frontal ventricular shunt catheter abutting the septum pellucidum is unchanged. Extensive periventricular white matter changes are present, stable since prior examination, nonspecific. This may reflect the sequela of  small vessel ischemia or demyelinating disease. Mild ventriculomegaly is unchanged possibly representing the sequela of central atrophy. Remote lacunar infarct noted within the right thalamus. No evidence of acute intracranial hemorrhage or infarct. No abnormal mass effect or midline shift. No abnormal intra or extra-axial mass lesion. Cerebellum is unremarkable. Vascular: No asymmetric hyperdense vasculature at the skull base. Skull: No acute fracture Other: Mastoid air cells and middle ear cavities are clear. CT MAXILLOFACIAL FINDINGS Osseous: No fracture or mandibular dislocation. No destructive process. Orbits: Negative. No traumatic or inflammatory finding. Sinuses: Clear. Soft tissues: Facial soft tissues are unremarkable. Endotracheal tube and nasogastric tube are visualized. CT CERVICAL SPINE FINDINGS Alignment: Normal. Skull base and vertebrae: Craniocervical alignment is normal. There is prominent enthesophyte formation at the level of the apical ligament at the craniocervical junction. Atlantal dental interval is not widened. There is no acute fracture of the cervical spine. Soft tissues and spinal canal: The spinal canal is widely patent. No canal hematoma. No prevertebral soft tissue swelling or fluid collections identified. No pathologic adenopathy within the cervical soft tissues. Visualized thyroid is unremarkable. Disc levels: There is intervertebral disc space narrowing at C5-C7 with associated endplate remodeling in keeping with changes of moderate degenerative disc disease. The prevertebral soft tissues are not thickened on sagittal reformats. Review of the axial images demonstrates mild to moderate bilateral neuroforaminal narrowing at C5-6 and C6-7 secondary to uncovertebral arthrosis mild bilateral neuroforaminal narrowing also noted at C7-T1. Upper chest: Emphysema noted. Other: None IMPRESSION: No acute intracranial injury.  No calvarial fracture. No acute facial fracture or mandibular  dislocation. No acute fracture or listhesis of the cervical spine. Emphysema (ICD10-J43.9). These results were called by telephone at the time of interpretation on 11/04/2021 at 10:42 pm to provider Redmond Pulling, MD, who verbally acknowledged these results. Electronically Signed   By: Fidela Salisbury M.D.   On: 11/04/2021 22:45    Assessment: 75 y.o. female PMH of hypertension, diabetes, hyperlipidemia, dementia, NPH status post VP shunt in 12/2016, stroke with  mild left-sided residual, COPD on home oxygen, anxiety, tardive dyskinesia presented to ED after fall and witnessed GTC seizure, lasting 1 minute and self-limiting. Post seizure, patient was postictal, somnolent, and she was intubated for airway protection.  Trauma alerted, work-up was CT head, C-spine and maxillofacial all negative.  She was loaded with Keppra 1.5 g, and put on propofol and Versed.  Neurology was consulted for seizure management. There was concerning that she was overdosed by taking all her Monday Tuesday and Wednesday pills today.  The medication including BuSpar, Wellbutrin, Crestor, Plavix and glipizide.  Etiology for her seizure not certain, however could be due to overdose with Wellbutrin.  Other etiology including VP shunt, history of stroke, fall with possible concussion.  Loaded with Keppra, recommend to continue Keppra 500 twice daily.  We will do LTM EEG to rule out NCSE. Will do MRI compatible EEG leads, so that MRI can be done for further seizure work up.  Plan: ICU admission by CCM Long-term EEG to rule out NCSE MRI for further seizure work up Continue Keppra 500 mg twice daily Overdose management per primary team Appreciate trauma on board Vent management per CCM Will follow   Thank you for this consultation and allowing [place] to participate in the care of this patient.  Rosalin Hawking, MD PhD Stroke Neurology 11/05/2021 12:31 AM

## 2021-11-05 NOTE — Progress Notes (Signed)
[facility] Progress Note Patient Name: Amanda Porter DOB: [birth year] MRN: [id number]   Date of Service  11/05/2021  HPI/Events of Note  Patient admitted with altered mental status and witnessed seizure in the context of suspected inadvertent overdose of her home medications. She is s/p fall but imaging does not suggest traumatic brain injury, baseline conditions include prior history of CVA, and VP shunt for normal pressure hydrocephalus.  eICU Interventions  New Patient Evaluation.         Kerry Kass Letina Luckett 11/05/2021, 2:31 AM

## 2021-11-05 NOTE — Progress Notes (Signed)
Patient transported from ED Trauma B to 5T97 with no complications noted.

## 2021-11-05 NOTE — Progress Notes (Signed)
Wasted 250 cc of Fentanyl and 90 cc of Versed with Abby Bailiff RN.

## 2021-11-05 NOTE — Progress Notes (Signed)
EEG complete - results pending 

## 2021-11-05 NOTE — H&P (Signed)
Please note that this is the same note that was mislabeled as a consult completed at 01: 31.  It has been copied in the appropriate format of an H&P.  NAME:  Amanda Porter, MRN:  [id number], DOB:  [birth year], LOS: 0 ADMISSION DATE:  11/04/2021, CONSULTATION DATE: 11/05/2021 REFERRING MD: Emergency room, CHIEF COMPLAINT: Altered level of consciousness  History of Present Illness:  This is a 75 year old black female that presented to the emergency room from home.  Patient's family is currently away on vacation in the patient's best friend is checking on her daily.  She did not get a response from a phone call immediately went to the patient's house.  Her best friend found her laying next to the bed.  It was noted that the patient did not have her oxygen on which she is dependent on 24 hours a day.  The patient's friend is not aware of any recent illness.  The patient does have mild to moderate dementia and intermittently takes too many of her medications.  She may have taken up to 3 days extra of her medications.  Initially the patient was minimally responsive at house and able to verbalize very basic answers to questions.  In route to the hospital patient had a witnessed seizure.  She does have a history of VP shunt. The patient was intubated in the emergency room after further seizure activity. Pertinent  Medical History  Hypertension Hydrocephaly-VP shunt COPD-oxygen dependent Anxiety Tardive dyskinesia Diabetes mellitus type 2 Dementia  Significant Hospital Events: Including procedures, antibiotic start and stop dates in addition to other pertinent events       Objective   Blood pressure (!) 93/49, pulse 74, temperature (!) 100.4 F (38 C), resp. rate (!) 26, height 5\' 3"  (1.6 m), weight 52.2 kg, SpO2 97 %.    Vent Mode: PRVC FiO2 (%):  [30 %-100 %] 30 % Set Rate:  [20 bmp-26 bmp] 26 bmp Vt Set:  [420 mL] 420 mL PEEP:  [5 cmH20] 5 cmH20 Plateau Pressure:  [14 cmH20-20 cmH20] 20 cmH20    Intake/Output Summary (Last 24 hours) at 11/05/2021 0643 Last data filed at 11/05/2021 0134 Gross per 24 hour  Intake 1200 ml  Output 1300 ml  Net -100 ml    Filed Weights   11/04/21 2220  Weight: 52.2 kg    Examination: General: No acute distress HENT: Orally intubated with endotracheal tube.  Mucous membranes are moist.  Left conjunctival hemorrhage with small periorbital contusion Lungs: Diminished breath sounds bilaterally but otherwise clear.  No significant wheezing rales or rhonchi noted. Cardiovascular: Regular rate no murmur rub or gallop Abdomen: Soft, nondistended, positive bowel sounds, no rebound/rigidity/guarding the limited by neurologic status. Extremities: Distal pulses intact x4.  Positive clubbing.  No cyanosis or edema.  She has a contusion of the left lateral wrist with no bony deformity that was able be palpated.  She also has a contusion of the left knee. Neuro: Unconscious/unresponsive not following commands.  Pupils are equal and reactive GU: Foley catheter in place   Assessment & Plan:  Acute respiratory failure Altered level of consciousness Recurrent seizures History of hydrocephaly with VP shunt placement COPD   Plan: Patient was admitted to the intensive care unit for further work-up. Standard ventilator protocol was initiated. We will use propofol and fentanyl for sedation and analgesia. The patient is currently being set up for continuous EEG. Continue Keppra. Appreciate neurology consultation.  We will follow their recommendations. MRI is pending. Neurochecks every  hour. N.p.o. Monitor I's/O's. Electrolyte protocol was initiated. Hold low molecular weight heparin today.  Patient is on oral anticoagulation.  Best Practice (right click and "Reselect all SmartList Selections" daily)   Diet/type: NPO DVT prophylaxis: not indicated GI prophylaxis: PPI Lines: N/A Foley:  Yes, and it is still needed Code Status:  full code Last date of  multidisciplinary goals of care discussion []   Labs   CBC: Recent Labs  Lab 11/04/21 2144 11/04/21 2149 11/04/21 2221 11/05/21 0325  WBC 16.2*  --   --  10.4  HGB 9.9* 11.6* 9.2* 7.5*  HCT 34.2* 34.0* 27.0* 24.9*  MCV 96.6  --   --  93.6  PLT 361  --   --  174     Basic Metabolic Panel: Recent Labs  Lab 11/04/21 2144 11/04/21 2149 11/04/21 2221 11/05/21 0325  NA 136 141 143 135  K 4.0 4.0 3.3* 3.4*  CL 100 101  --  103  CO2 20*  --   --  24  GLUCOSE 161* 158*  --  167*  BUN 15 15  --  11  CREATININE 1.13* 0.80  --  0.82  CALCIUM 9.8  --   --  8.0*  MG  --   --   --  1.9    GFR: Estimated Creatinine Clearance: 48.1 mL/min (by C-G formula based on SCr of 0.82 mg/dL). Recent Labs  Lab 11/04/21 2144 11/05/21 0325  WBC 16.2* 10.4  LATICACIDVEN >9.0* 2.3*     Liver Function Tests: Recent Labs  Lab 11/04/21 2144  AST 36  ALT 23  ALKPHOS 62  BILITOT 0.2*  PROT 7.2  ALBUMIN 3.6    No results for input(s): LIPASE, AMYLASE in the last 168 hours. No results for input(s): AMMONIA in the last 168 hours.  ABG    Component Value Date/Time   PHART 7.302 (L) 11/04/2021 2221   PCO2ART 55.7 (H) 11/04/2021 2221   PO2ART 530 (H) 11/04/2021 2221   HCO3 27.5 11/04/2021 2221   TCO2 29 11/04/2021 2221   O2SAT 100.0 11/04/2021 2221      Coagulation Profile: Recent Labs  Lab 11/04/21 2144  INR 1.1     Cardiac Enzymes: No results for input(s): CKTOTAL, CKMB, CKMBINDEX, TROPONINI in the last 168 hours.  HbA1C: No results found for: HGBA1C  CBG: Recent Labs  Lab 11/05/21 0216 11/05/21 0317 11/05/21 0404 11/05/21 0525 11/05/21 0622  GLUCAP 174* 157* 181* 131* 117*     Review of Systems:   Unable to obtain secondary to patient's neurologic status  Past Medical History:  She,  has a past medical history of CVA (cerebral vascular accident) ([place]), Dementia ([place]), Depression, Diabetes mellitus ([place]), Dysphagia, and Tardive dyskinesia.   Surgical  History:   Past Surgical History:  Procedure Laterality Date   VENTRICULOPERITONEAL SHUNT       Social History:   reports that she has quit smoking. Her smoking use included cigarettes. She has never used smokeless tobacco. She reports that she does not currently use alcohol. She reports that she does not currently use drugs.   Family History:  Her family history is not on file.   Allergies Allergies  Allergen Reactions   Lexapro [Escitalopram]    Neosporin [Bacitracin-Polymyxin B] Rash     Home Medications  Prior to Admission medications   Not on File     Critical care time: 55 mins

## 2021-11-05 NOTE — Progress Notes (Signed)
LTM EEG hooked up and running MR comp leads- no initial skin breakdown - push button tested - neuro notified. [facility] monitoring.

## 2021-11-05 NOTE — Consult Note (Signed)
NAME:  Amanda Porter, MRN:  [id number], DOB:  [birth year], LOS: 0 ADMISSION DATE:  11/04/2021, CONSULTATION DATE: 11/05/2021 REFERRING MD: Emergency room, CHIEF COMPLAINT: Altered level of consciousness  History of Present Illness:  This is a 75 year old black female that presented to the emergency room from home.  Patient's family is currently away on vacation in the patient's best friend is checking on her daily.  She did not get a response from a phone call immediately went to the patient's house.  Her best friend found her laying next to the bed.  It was noted that the patient did not have her oxygen on which she is dependent on 24 hours a day.  The patient's friend is not aware of any recent illness.  The patient does have mild to moderate dementia and intermittently takes too many of her medications.  She may have taken up to 3 days extra of her medications.  Initially the patient was minimally responsive at house and able to verbalize very basic answers to questions.  In route to the hospital patient had a witnessed seizure.  She does have a history of VP shunt. The patient was intubated in the emergency room after further seizure activity. Pertinent  Medical History  Hypertension Hydrocephaly-VP shunt COPD-oxygen dependent Anxiety Tardive dyskinesia Diabetes mellitus type 2 Dementia  Significant Hospital Events: Including procedures, antibiotic start and stop dates in addition to other pertinent events       Objective   Blood pressure (!) 88/66, pulse 77, temperature 99 F (37.2 C), resp. rate (!) 26, height 5\' 3"  (1.6 m), weight 52.2 kg, SpO2 100 %.    Vent Mode: PRVC FiO2 (%):  [30 %-100 %] 30 % Set Rate:  [20 bmp-26 bmp] 26 bmp Vt Set:  [420 mL] 420 mL PEEP:  [5 cmH20] 5 cmH20 Plateau Pressure:  [14 cmH20-18 cmH20] 18 cmH20   Intake/Output Summary (Last 24 hours) at 11/05/2021 0132 Last data filed at 11/05/2021 0100 Gross per 24 hour  Intake 1200 ml  Output 300 ml  Net  900 ml   Filed Weights   11/04/21 2220  Weight: 52.2 kg    Examination: General: No acute distress HENT: Orally intubated with endotracheal tube.  Mucous membranes are moist.  Left conjunctival hemorrhage with small periorbital contusion Lungs: Diminished breath sounds bilaterally but otherwise clear.  No significant wheezing rales or rhonchi noted. Cardiovascular: Regular rate no murmur rub or gallop Abdomen: Soft, nondistended, positive bowel sounds, no rebound/rigidity/guarding the limited by neurologic status. Extremities: Distal pulses intact x4.  Positive clubbing.  No cyanosis or edema.  She has a contusion of the left lateral wrist with no bony deformity that was able be palpated.  She also has a contusion of the left knee. Neuro: Unconscious/unresponsive not following commands.  Pupils are equal and reactive GU: Foley catheter in place   Assessment & Plan:  Acute respiratory failure Altered level of consciousness Recurrent seizures History of hydrocephaly with VP shunt placement COPD   Plan: Patient was admitted to the intensive care unit for further work-up. Standard ventilator protocol was initiated. We will use propofol and fentanyl for sedation and analgesia. The patient is currently being set up for continuous EEG. Continue Keppra. Appreciate neurology consultation.  We will follow their recommendations. MRI is pending. Neurochecks every hour. N.p.o. Monitor I's/O's. Electrolyte protocol was initiated. Hold low molecular weight heparin today.  Patient is on oral anticoagulation.  Best Practice (right click and "Reselect all SmartList Selections" daily)  Diet/type: NPO DVT prophylaxis: not indicated GI prophylaxis: PPI Lines: N/A Foley:  Yes, and it is still needed Code Status:  full code Last date of multidisciplinary goals of care discussion []   Labs   CBC: Recent Labs  Lab 11/04/21 2144 11/04/21 2149 11/04/21 2221  WBC 16.2*  --   --   HGB  9.9* 11.6* 9.2*  HCT 34.2* 34.0* 27.0*  MCV 96.6  --   --   PLT 361  --   --     Basic Metabolic Panel: Recent Labs  Lab 11/04/21 2144 11/04/21 2149 11/04/21 2221  NA 136 141 143  K 4.0 4.0 3.3*  CL 100 101  --   CO2 20*  --   --   GLUCOSE 161* 158*  --   BUN 15 15  --   CREATININE 1.13* 0.80  --   CALCIUM 9.8  --   --    GFR: Estimated Creatinine Clearance: 49.3 mL/min (by C-G formula based on SCr of 0.8 mg/dL). Recent Labs  Lab 11/04/21 2144  WBC 16.2*  LATICACIDVEN >9.0*    Liver Function Tests: Recent Labs  Lab 11/04/21 2144  AST 36  ALT 23  ALKPHOS 62  BILITOT 0.2*  PROT 7.2  ALBUMIN 3.6   No results for input(s): LIPASE, AMYLASE in the last 168 hours. No results for input(s): AMMONIA in the last 168 hours.  ABG    Component Value Date/Time   PHART 7.302 (L) 11/04/2021 2221   PCO2ART 55.7 (H) 11/04/2021 2221   PO2ART 530 (H) 11/04/2021 2221   HCO3 27.5 11/04/2021 2221   TCO2 29 11/04/2021 2221   O2SAT 100.0 11/04/2021 2221     Coagulation Profile: Recent Labs  Lab 11/04/21 2144  INR 1.1    Cardiac Enzymes: No results for input(s): CKTOTAL, CKMB, CKMBINDEX, TROPONINI in the last 168 hours.  HbA1C: No results found for: HGBA1C  CBG: Recent Labs  Lab 11/04/21 2307 11/05/21 0118  GLUCAP 138* 162*    Review of Systems:   Unable to obtain secondary to patient's neurologic status  Past Medical History:  She,  has a past medical history of CVA (cerebral vascular accident) ([place]), Dementia ([place]), Depression, Diabetes mellitus ([place]), Dysphagia, and Tardive dyskinesia.   Surgical History:   Past Surgical History:  Procedure Laterality Date   VENTRICULOPERITONEAL SHUNT       Social History:   reports that she has quit smoking. Her smoking use included cigarettes. She has never used smokeless tobacco. She reports that she does not currently use alcohol. She reports that she does not currently use drugs.   Family History:  Her family  history is not on file.   Allergies Allergies  Allergen Reactions   Lexapro [Escitalopram]    Neosporin [Bacitracin-Polymyxin B] Rash     Home Medications  Prior to Admission medications   Not on File     Critical care time: 55 mins

## 2021-11-05 NOTE — Progress Notes (Signed)
[facility] ADULT ICU REPLACEMENT PROTOCOL   The patient does apply for the [facility] Adult ICU Electrolyte Replacment Protocol based on the criteria listed below:   1.Exclusion criteria: TCTS patients, ECMO patients, and Dialysis patients 2. Is GFR >/= 30 ml/min? Yes.    Patient's GFR today is >60 3. Is SCr </= 2? Yes.   Patient's SCr is 0.82 mg/dL 4. Did SCr increase >/= 0.5 in 24 hours? No. 5.Pt's weight >40kg  Yes.   6. Abnormal electrolyte(s): K 3.4  7. Electrolytes replaced per protocol 8.  Call MD STAT for K+ </= 2.5, Phos </= 1, or Mag </= 1 Physician:    Ronda Fairly A 11/05/2021 6:04 AM

## 2021-11-05 NOTE — Progress Notes (Signed)
Maint complete. 

## 2021-11-05 NOTE — Progress Notes (Signed)
[facility] Progress Note Patient Name: Amanda Porter DOB: 04/11/1946 MRN: [id number]   Date of Service  11/05/2021  HPI/Events of Note  BP 99/50, MAP 65.  eICU Interventions  Peripheral Norepinephrine gtt ordered PRN MAP < 65 mmHg.        Kerry Kass Zaul Hubers 11/05/2021, 6:51 AM

## 2021-11-05 NOTE — Progress Notes (Signed)
Contacted Elink due to pt's hypotension.  Decreased Fentanyl from 100 mcg to 50 mcg.  Waited a few BP cycles to see if pt's BP increased.  Pt still hypotensive.  Elink suggested cutting cutting Versed in half.  Versed decreased to 1 mg/hr.  1,000 mL NS bolus ordered.  Will start bolus and continue to monitor.

## 2021-11-05 NOTE — Procedures (Addendum)
Extubation Procedure Note  Patient Details:   Name: Amanda Porter DOB: 21-Aug-1946 MRN: [id number]   Airway Documentation:    Vent end date: 11/05/21 Vent end time: 1126   Evaluation  O2 sats: stable throughout Complications: No apparent complications Patient did tolerate procedure well.    Pt extubated to 4L [place] per MD order. Pt had positive cuff leak prior to extubation. No stridor noted. Pt able to voice her name.   [name] 11/05/2021, 11:27 AM

## 2021-11-05 NOTE — Consult Note (Signed)
NAME:  Amanda Porter, MRN:  [id number], DOB:  November 20, 1946, LOS: 0 ADMISSION DATE:  11/04/2021, CONSULTATION DATE: 11/05/2021 REFERRING MD: Emergency room, CHIEF COMPLAINT: Altered level of consciousness  History of Present Illness:  This is a 74 year old black female that presented to the emergency room from home.  Patient's family is currently away on vacation in the patient's best friend is checking on her daily.  She did not get a response from a phone call immediately went to the patient's house.  Her best friend found her laying next to the bed.  It was noted that the patient did not have her oxygen on which she is dependent on 24 hours a day.  The patient's friend is not aware of any recent illness.  The patient does have mild to moderate dementia and intermittently takes too many of her medications.  She may have taken up to 3 days extra of her medications.  Initially the patient was minimally responsive at house and able to verbalize very basic answers to questions.  In route to the hospital patient had a witnessed seizure.  She does have a history of VP shunt. The patient was intubated in the emergency room after further seizure activity. Pertinent  Medical History  Hypertension Hydrocephaly-VP shunt COPD-oxygen dependent Anxiety Tardive dyskinesia Diabetes mellitus type 2 Dementia  Significant Hospital Events: Including procedures, antibiotic start and stop dates in addition to other pertinent events   12/5 admitted to ICU  Objective   Blood pressure (!) 111/55, pulse 75, temperature 100 F (37.8 C), temperature source Bladder, resp. rate (!) 26, height 5\' 3"  (1.6 m), weight 53.7 kg, SpO2 98 %.    Vent Mode: PRVC FiO2 (%):  [30 %-100 %] 30 % Set Rate:  [20 bmp-26 bmp] 26 bmp Vt Set:  [420 mL] 420 mL PEEP:  [5 cmH20] 5 cmH20 Plateau Pressure:  [14 cmH20-20 cmH20] 20 cmH20   Intake/Output Summary (Last 24 hours) at 11/05/2021 0816 Last data filed at 11/05/2021 0700 Gross per 24  hour  Intake 2242.98 ml  Output 1380 ml  Net 862.98 ml    Filed Weights   11/04/21 2220 11/05/21 0500  Weight: 52.2 kg 53.7 kg    Examination: General: No acute distress HENT: Orally intubated with endotracheal tube.  Mucous membranes are moist.  Left conjunctival hemorrhage with small periorbital contusion Lungs: Diminished breath sounds bilaterally but otherwise clear.  No significant wheezing rales or rhonchi noted. Cardiovascular: Regular rate no murmur rub or gallop Abdomen: Soft, nondistended, positive bowel sounds, no rebound/rigidity/guarding the limited by neurologic status. Extremities: Distal pulses intact x4.  Positive clubbing.  No cyanosis or edema.  She has a contusion of the left lateral wrist with no bony deformity that was able be palpated.  She also has a contusion of the left knee. Neuro: sedation off. Will awake and move all limbs but not following commands GU: Foley catheter in place   Ancillary tests personally reviewed:  HB: 7.5 CT head 12/5: shunt in place ventriculomegaly.  No seizures on cEEG Assessment & Plan:  Acute respiratory failure Altered level of consciousness Recurrent seizures History of hydrocephaly with VP shunt placement COPD   Plan: - Stop sedation and allow to wake up and extubate if possible.   Best Practice (right click and "Reselect all SmartList Selections" daily)   Diet/type: NPO DVT prophylaxis: not indicated GI prophylaxis: PPI Lines: N/A Foley:  Yes, and it is still needed Code Status:  full code Last date of multidisciplinary  goals of care discussion []   CRITICAL CARE Performed by: Kipp Brood   Total critical care time: 40 minutes  Critical care time was exclusive of separately billable procedures and treating other patients.  Critical care was necessary to treat or prevent imminent or life-threatening deterioration.  Critical care was time spent personally by me on the following activities: development of  treatment plan with patient and/or surrogate as well as nursing, discussions with consultants, evaluation of patient's response to treatment, examination of patient, obtaining history from patient or surrogate, ordering and performing treatments and interventions, ordering and review of laboratory studies, ordering and review of radiographic studies, pulse oximetry, re-evaluation of patient's condition and participation in multidisciplinary rounds.  Kipp Brood, MD [facility] ICU Physician [place]  Pager: [phone] Mobile: [phone] After hours: [phone].

## 2021-11-05 NOTE — Progress Notes (Signed)
[facility] Progress Note Patient Name: MAKENLEE MCKEAG DOB: 09/10/1946 MRN: [id number]   Date of Service  11/05/2021  HPI/Events of Note  Patient hypotensive  (79/46) on Propofol and Fentanyl gtt.  eICU Interventions  Normal Saline iv fluid bolus 1000 cc at 500 cc / hour ordered x 1.        Kerry Kass Tashea Othman 11/05/2021, 3:49 AM

## 2021-11-05 NOTE — Progress Notes (Signed)
LTM maint complete - no skin breakdown under: Fp1 Fp2

## 2021-11-06 ENCOUNTER — Inpatient Hospital Stay (HOSPITAL_COMMUNITY): Payer: Medicare PPO

## 2021-11-06 ENCOUNTER — Telehealth: Payer: Self-pay | Admitting: Primary Care

## 2021-11-06 DIAGNOSIS — T1490XA Injury, unspecified, initial encounter: Secondary | ICD-10-CM

## 2021-11-06 LAB — BASIC METABOLIC PANEL
Anion gap: 6 (ref 5–15)
Anion gap: 8 (ref 5–15)
BUN: 6 mg/dL — ABNORMAL LOW (ref 8–23)
BUN: 6 mg/dL — ABNORMAL LOW (ref 8–23)
CO2: 28 mmol/L (ref 22–32)
CO2: 30 mmol/L (ref 22–32)
Calcium: 7.9 mg/dL — ABNORMAL LOW (ref 8.9–10.3)
Calcium: 8.5 mg/dL — ABNORMAL LOW (ref 8.9–10.3)
Chloride: 103 mmol/L (ref 98–111)
Chloride: 102 mmol/L (ref 98–111)
Creatinine, Ser: 0.59 mg/dL (ref 0.44–1.00)
Creatinine, Ser: 0.71 mg/dL (ref 0.44–1.00)
GFR, Estimated: >60 mL/min (ref >60)
GFR, Estimated: >60 mL/min (ref >60)
Glucose, Bld: 99 mg/dL (ref 70–99)
Glucose, Bld: 95 mg/dL (ref 70–99)
Potassium: 3.6 mmol/L (ref 3.5–5.1)
Potassium: 3.9 mmol/L (ref 3.5–5.1)
Sodium: 137 mmol/L (ref 135–145)
Sodium: 140 mmol/L (ref 135–145)

## 2021-11-06 LAB — GLUCOSE, CAPILLARY
Glucose-Capillary: 86 mg/dL (ref 70–99)
Glucose-Capillary: 92 mg/dL (ref 70–99)
Glucose-Capillary: 106 mg/dL — ABNORMAL HIGH (ref 70–99)
Glucose-Capillary: 140 mg/dL — ABNORMAL HIGH (ref 70–99)
Glucose-Capillary: 174 mg/dL — ABNORMAL HIGH (ref 70–99)

## 2021-11-06 LAB — CBC
HCT: 26.9 % — ABNORMAL LOW (ref 36.0–46.0)
Hemoglobin: 8.4 g/dL — ABNORMAL LOW (ref 12.0–15.0)
MCH: 28.9 pg (ref 26.0–34.0)
MCHC: 31.2 g/dL (ref 30.0–36.0)
MCV: 92.4 fL (ref 80.0–100.0)
Platelets: 249 10*3/uL (ref 150–400)
RBC: 2.91 MIL/uL — ABNORMAL LOW (ref 3.87–5.11)
RDW: 13.2 % (ref 11.5–15.5)
WBC: 12.9 10*3/uL — ABNORMAL HIGH (ref 4.0–10.5)
nRBC: 0 % (ref 0.0–0.2)

## 2021-11-06 LAB — URINALYSIS, COMPLETE (UACMP) WITH MICROSCOPIC
Bilirubin Urine: NEGATIVE
Glucose, UA: NEGATIVE mg/dL
Ketones, ur: NEGATIVE mg/dL
Nitrite: NEGATIVE
Protein, ur: NEGATIVE mg/dL
Specific Gravity, Urine: 1.01 (ref 1.005–1.030)
pH: 6 (ref 5.0–8.0)

## 2021-11-06 LAB — MAGNESIUM: Magnesium: 1.8 mg/dL (ref 1.7–2.4)

## 2021-11-06 MED ORDER — POTASSIUM CHLORIDE CRYS ER 20 MEQ PO TBCR
40.0000 meq | EXTENDED_RELEASE_TABLET | Freq: Once | ORAL | Status: AC
  Administered 2021-11-06: 40 meq via ORAL
  Filled 2021-11-06: qty 2

## 2021-11-06 MED ORDER — MAGNESIUM SULFATE 2 GM/50ML IV SOLN
2.0000 g | Freq: Once | INTRAVENOUS | Status: AC
  Administered 2021-11-06: 2 g via INTRAVENOUS
  Filled 2021-11-06: qty 50

## 2021-11-06 MED ORDER — ORAL CARE MOUTH RINSE
15.0000 mL | Freq: Two times a day (BID) | OROMUCOSAL | Status: DC
  Administered 2021-11-06 – 2021-11-12 (×12): 15 mL via OROMUCOSAL

## 2021-11-06 MED ORDER — ROSUVASTATIN CALCIUM 20 MG PO TABS
20.0000 mg | ORAL_TABLET | Freq: Every day | ORAL | Status: DC
  Administered 2021-11-06 – 2021-11-12 (×7): 20 mg via ORAL
  Filled 2021-11-06 (×7): qty 1

## 2021-11-06 MED ORDER — ENOXAPARIN SODIUM 40 MG/0.4ML IJ SOSY
40.0000 mg | PREFILLED_SYRINGE | Freq: Every day | INTRAMUSCULAR | Status: DC
  Administered 2021-11-06 – 2021-11-12 (×7): 40 mg via SUBCUTANEOUS
  Filled 2021-11-06 (×7): qty 0.4

## 2021-11-06 MED ORDER — POTASSIUM CHLORIDE 20 MEQ PO PACK
20.0000 meq | PACK | Freq: Once | ORAL | Status: AC
  Administered 2021-11-06: 20 meq via ORAL
  Filled 2021-11-06: qty 1

## 2021-11-06 MED ORDER — UMECLIDINIUM BROMIDE 62.5 MCG/ACT IN AEPB
1.0000 | INHALATION_SPRAY | Freq: Every day | RESPIRATORY_TRACT | Status: DC
  Administered 2021-11-06 – 2021-11-12 (×7): 1 via RESPIRATORY_TRACT
  Filled 2021-11-06: qty 7

## 2021-11-06 MED ORDER — BUSPIRONE HCL 15 MG PO TABS
30.0000 mg | ORAL_TABLET | Freq: Two times a day (BID) | ORAL | Status: DC
  Administered 2021-11-06 – 2021-11-08 (×5): 30 mg via ORAL
  Filled 2021-11-06 (×5): qty 2

## 2021-11-06 MED ORDER — FLUTICASONE FUROATE-VILANTEROL 200-25 MCG/ACT IN AEPB
1.0000 | INHALATION_SPRAY | Freq: Every day | RESPIRATORY_TRACT | Status: DC
  Administered 2021-11-06 – 2021-11-12 (×7): 1 via RESPIRATORY_TRACT
  Filled 2021-11-06: qty 28

## 2021-11-06 NOTE — Progress Notes (Signed)
Left wrist x-ray from 12/5 shows Nondisplaced ulnar styloid fracture. Spoke with ortho. Recommend to order removable wrist splint and follow up with ortho outpatient.

## 2021-11-06 NOTE — Telephone Encounter (Signed)
Called daughter gave message she will call if anything is needed.

## 2021-11-06 NOTE — Telephone Encounter (Signed)
[name] STATED THAT SHE WANTED TO LET [name] KNOW THAT HER MOM Amanda Porter -Job  SHE HAD A FALL AND IT TURNED INTO A SEIZURE AND IS IN ICU.

## 2021-11-06 NOTE — Telephone Encounter (Signed)
Please thank her daughter for the update. I had already been following along as the hospital doctors have been sending me notes. She and her mother are in our thoughts!! Have her reach out if she needs something from our end.

## 2021-11-06 NOTE — Plan of Care (Signed)

## 2021-11-06 NOTE — Progress Notes (Signed)
NAME:  Amanda Porter, MRN:  [id number], DOB:  02-28-1946, LOS: 1 ADMISSION DATE:  11/04/2021, CONSULTATION DATE: 11/05/2021 REFERRING MD: Emergency room, CHIEF COMPLAINT: Altered level of consciousness  History of Present Illness:  This is a 75 year old black female that presented to the emergency room from home.  Patient's family is currently away on vacation in the patient's best friend is checking on her daily.  She did not get a response from a phone call immediately went to the patient's house.  Her best friend found her laying next to the bed.  It was noted that the patient did not have her oxygen on which she is dependent on 24 hours a day.  The patient's friend is not aware of any recent illness.  The patient does have mild to moderate dementia and intermittently takes too many of her medications.  She may have taken up to 3 days extra of her medications.  Initially the patient was minimally responsive at house and able to verbalize very basic answers to questions.  In route to the hospital patient had a witnessed seizure.  She does have a history of VP shunt. The patient was intubated in the emergency room after further seizure activity. Pertinent  Medical History  Hypertension Hydrocephaly-VP shunt COPD-oxygen dependent Anxiety Tardive dyskinesia Diabetes mellitus type 2 Dementia  Significant Hospital Events: Including procedures, antibiotic start and stop dates in addition to other pertinent events   12/5 admitted to ICU 12/6 extubated  Interim Hx Patient on continuous EEG On 3 L [place] with sats 99% Following commands; PERRL; MAE Temp 100 F   Objective   Blood pressure 120/87, pulse (!) 104, temperature (!) 100.4 F (38 C), temperature source Bladder, resp. rate (!) 21, height 5\' 3"  (1.6 m), weight 52.1 kg, SpO2 94 %.    Vent Mode: PSV;CPAP FiO2 (%):  [40 %] 40 % PEEP:  [5 cmH20] 5 cmH20 Pressure Support:  [5 cmH20] 5 cmH20   Intake/Output Summary (Last 24 hours) at  11/06/2021 0849 Last data filed at 11/06/2021 0600 Gross per 24 hour  Intake 273.38 ml  Output 1340 ml  Net -1066.62 ml    Filed Weights   11/04/21 2220 11/05/21 0500 11/06/21 0447  Weight: 52.2 kg 53.7 kg 52.1 kg    Examination: General:   NAD HEENT: MM pink/moist; [place] in place Neuro: Aox3; MAE; PERRL CV: s1s2, RRR, no m/r/g PULM:  dim clear BS bilaterally; [place] 3 L GI: soft, bsx4 active  Extremities: warm/dry, no edema  Skin: no rashes or lesions   Ancillary tests personally reviewed:   Mag 1.8; K 3.6= both being repleted WBC 12.9 Hgb 8.4  Assessment & Plan:  Acute respiratory failure: improved COPD: wears 3 L [place] at home; takes stiolto respimat  P: -extubated yesterday -on home O2 requirements -breo and incruse ordered -prn duoneb for wheezing  Altered level of consciousness Recurrent seizures History of hydrocephaly with VP shunt placement P: -Neuro following -continuous EEG in place -continue keppra -avoid fevers; prn tylenol  Hypokalemia Hypomagnesemia P: -repleted this am -repeat later this afternoon -trend and replete as needed  HLD P: -continue home crestor  Hx of anxiety P: -continue home buspar -will hold wellbutrin due to low seizure threshold  DMT2 P: -will hold on ssi; cbg stable -continue to monitor glucose  PCCM will consider sending out of ICU when continuous EEG is removed. PCCM will be available PRN  Best Practice (right click and "Reselect all SmartList Selections" daily)   Diet/type:  NPO DVT prophylaxis: LMWH GI prophylaxis: N/A Lines: N/A Foley:  Yes, and it is still needed Code Status:  full code Last date of multidisciplinary goals of care discussion [12/7 patient and friend updated at bedside; daughter is flying home from cruise from [place] Performed by: Mick Sell   Total critical care time: 35 minutes  JD Rexene Agent [facility] 11/06/2021, 9:17 AM  Please see  [email or web address] for pager details.  From 7A-7P if no response, please call [phone]. After hours, please call ELink [phone].

## 2021-11-06 NOTE — Progress Notes (Addendum)
Patient moved to different unit. Now at [facility]. The 4N  Nurse informed tech of issue after unplugging unit from outlet. MC#12 Computer switched out with MC#4. The, headbox remain the same.

## 2021-11-06 NOTE — Progress Notes (Signed)
K+ 3.6, Mg 1.8 Replaced per protocol  

## 2021-11-06 NOTE — Procedures (Addendum)
Patient Name: Amanda Porter  MRN: [id number]  Epilepsy Attending: Lora Havens  Referring Physician/Provider: Dr Rosalin Hawking Duration: 11/06/2021 0221 to 11/06/2021 1046   Patient history: 75 year old female presented with generalized tonic-clonic seizure-like episode.  EEG to evaluate for seizure.   Level of alertness: awake, asleep   AEDs during EEG study: LEV   Technical aspects: This EEG study was done with scalp electrodes positioned according to the 10-20 International system of electrode placement. Electrical activity was acquired at a sampling rate of 500Hz  and reviewed with a high frequency filter of 70Hz  and a low frequency filter of 1Hz . EEG data were recorded continuously and digitally stored.    Description: No clear posterior dominant rhythm was seen. Sleep was characterized by sleep spindles (12 to 14 Hz), maximum frontocentral region.  EEG showed continuous generalized 6-9 Hz theta-alpha activity as well as intermittent generalized 2-3Hz  delta slowing. Hyperventilation and photic stimulation were not performed.     ABNORMALITY - Continuous slow, generalized   IMPRESSION: This study is suggestive of mild to moderate diffuse encephalopathy, nonspecific etiology. No seizures or definite epileptiform discharges were seen throughout the recording.   Nandana Krolikowski Barbra Sarks

## 2021-11-06 NOTE — Progress Notes (Signed)
D/C patient. No skin break down noted. [facility] notified.

## 2021-11-06 NOTE — Progress Notes (Signed)
Orthopedic Tech Progress Note Patient Details:  Amanda Porter 05/28/1946 [id number]  Ortho Devices Type of Ortho Device: Velcro wrist splint Ortho Device/Splint Location: Left Wrist Ortho Device/Splint Interventions: Application   Post Interventions Patient Tolerated: Well  Linus Salmons Ozias Dicenzo 11/06/2021, 12:28 PM

## 2021-11-07 ENCOUNTER — Inpatient Hospital Stay (HOSPITAL_COMMUNITY): Payer: Medicare PPO

## 2021-11-07 DIAGNOSIS — E43 Unspecified severe protein-calorie malnutrition: Secondary | ICD-10-CM | POA: Insufficient documentation

## 2021-11-07 LAB — CBC
HCT: 31.4 % — ABNORMAL LOW (ref 36.0–46.0)
Hemoglobin: 9.6 g/dL — ABNORMAL LOW (ref 12.0–15.0)
MCH: 28.3 pg (ref 26.0–34.0)
MCHC: 30.6 g/dL (ref 30.0–36.0)
MCV: 92.6 fL (ref 80.0–100.0)
Platelets: 285 10*3/uL (ref 150–400)
RBC: 3.39 MIL/uL — ABNORMAL LOW (ref 3.87–5.11)
RDW: 13.1 % (ref 11.5–15.5)
WBC: 12 10*3/uL — ABNORMAL HIGH (ref 4.0–10.5)
nRBC: 0 % (ref 0.0–0.2)

## 2021-11-07 LAB — GLUCOSE, CAPILLARY
Glucose-Capillary: 103 mg/dL — ABNORMAL HIGH (ref 70–99)
Glucose-Capillary: 93 mg/dL (ref 70–99)
Glucose-Capillary: 189 mg/dL — ABNORMAL HIGH (ref 70–99)
Glucose-Capillary: 199 mg/dL — ABNORMAL HIGH (ref 70–99)
Glucose-Capillary: 208 mg/dL — ABNORMAL HIGH (ref 70–99)
Glucose-Capillary: 145 mg/dL — ABNORMAL HIGH (ref 70–99)

## 2021-11-07 LAB — MAGNESIUM: Magnesium: 2.1 mg/dL (ref 1.7–2.4)

## 2021-11-07 LAB — BASIC METABOLIC PANEL
Anion gap: 8 (ref 5–15)
BUN: 7 mg/dL — ABNORMAL LOW (ref 8–23)
CO2: 27 mmol/L (ref 22–32)
Calcium: 8.3 mg/dL — ABNORMAL LOW (ref 8.9–10.3)
Chloride: 103 mmol/L (ref 98–111)
Creatinine, Ser: 0.49 mg/dL (ref 0.44–1.00)
GFR, Estimated: >60 mL/min (ref >60)
Glucose, Bld: 101 mg/dL — ABNORMAL HIGH (ref 70–99)
Potassium: 3.7 mmol/L (ref 3.5–5.1)
Sodium: 138 mmol/L (ref 135–145)

## 2021-11-07 MED ORDER — LEVETIRACETAM 500 MG PO TABS
500.0000 mg | ORAL_TABLET | Freq: Two times a day (BID) | ORAL | Status: DC
  Administered 2021-11-07 – 2021-11-08 (×3): 500 mg via ORAL
  Filled 2021-11-07 (×3): qty 1

## 2021-11-07 MED ORDER — ENSURE ENLIVE PO LIQD
237.0000 mL | Freq: Two times a day (BID) | ORAL | Status: DC
  Administered 2021-11-07 – 2021-11-12 (×8): 237 mL via ORAL

## 2021-11-07 MED ORDER — GUAIFENESIN ER 600 MG PO TB12
600.0000 mg | ORAL_TABLET | Freq: Two times a day (BID) | ORAL | Status: DC
  Administered 2021-11-07 – 2021-11-12 (×10): 600 mg via ORAL
  Filled 2021-11-07 (×10): qty 1

## 2021-11-07 NOTE — Plan of Care (Signed)

## 2021-11-07 NOTE — Evaluation (Signed)
Occupational Therapy Evaluation Patient Details Name: Amanda Porter MRN: [id number] DOB: [birth year] Today's Date: 11/07/2021   History of Present Illness 75 yo admitted 12/5 after found down at home without O2 on. Pt with seizure enroute and intubated. Extubated 12/6. 12/7 EEG with encephalopathy. Pt with left ulnar fx with splint. PMhx: hydrocephaly with VP shunt, dementia, HTN, anxiety, COPD on home 3L, DM   Clinical Impression   Pt admitted with the above diagnoses and presents with below problem list. Pt will benefit from continued acute OT to address the below listed deficits and maximize independence with basic ADLs prior to d/c to venue below. At baseline, pt is mod I with basic ADLs, needs rollator for safe mobility, supervision for tub shower transfer. Pt's daughter present and provided home setup/PLOF data as pt is not a reliable historian. Daughter reports cognition is not back to baseline today. Pt currently needs up to mod A with UB/LB ADLs, up to min A with functional transfers. Pt lives with daughter who works and is unable to provide 24 hour supervision/assistance. Recommend ST SNF for further rehab.       Recommendations for follow up therapy are one component of a multi-disciplinary discharge planning process, led by the attending physician.  Recommendations may be updated based on patient status, additional functional criteria and insurance authorization.   Follow Up Recommendations  Skilled nursing-short term rehab (<3 hours/day)    Assistance Recommended at Discharge Frequent or constant Supervision/Assistance  Functional Status Assessment  Patient has had a recent decline in their functional status and demonstrates the ability to make significant improvements in function in a reasonable and predictable amount of time.  Equipment Recommendations  Other (comment) (defer to next venue)    Recommendations for Other Services       Precautions / Restrictions  Precautions Precautions: Fall;Other (comment) Required Braces or Orthoses: Splint/Cast Splint/Cast: left wrist Restrictions Weight Bearing Restrictions: Yes LUE Weight Bearing: Weight bear through elbow only      Mobility Bed Mobility Overal bed mobility: Needs Assistance Bed Mobility: Supine to Sit     Supine to sit: Supervision     General bed mobility comments: up in recliner    Transfers Overall transfer level: Needs assistance Equipment used: 1 person hand held assist Transfers: Sit to/from Stand Sit to Stand: Min assist;Min guard           General transfer comment: Pt with sudden return to sitting on initital stand, endorsed dizziness. Seeks external support. Min guard for subsequent sit<>stand. Utilized HHA in lieu of walker.      Balance Overall balance assessment: Needs assistance Sitting-balance support: No upper extremity supported Sitting balance-Leahy Scale: Good       Standing balance-Leahy Scale: Poor Standing balance comment: external support for dynamic standing                           ADL either performed or assessed with clinical judgement   ADL Overall ADL's : Needs assistance/impaired Eating/Feeding: Set up;Sitting   Grooming: Sitting;Moderate assistance   Upper Body Bathing: Moderate assistance;Sitting   Lower Body Bathing: Moderate assistance;Sit to/from stand   Upper Body Dressing : Moderate assistance;Sitting   Lower Body Dressing: Moderate assistance;Sit to/from stand   Toilet Transfer: Minimal assistance;Ambulation;Comfort height toilet;Grab bars   Toileting- Clothing Manipulation and Hygiene: Moderate assistance;Sitting/lateral lean;Sit to/from stand   Tub/ Shower Transfer: Tub transfer;Moderate assistance;Ambulation;Shower seat   Functional mobility during ADLs: Min guard;Minimal assistance (HHA)  General ADL Comments: Pt completed functional mobility in the room navigating tight spaces and turns, up to min  steadying assist. Pt currently unable to use rw d/t WB status of LUE. Acute on chronic cognitive deficits. Daughter reports pt not at cognitive baseline, impacting safety with basic ADLs.     Vision         Perception     Praxis      Pertinent Vitals/Pain Pain Assessment: Faces Faces Pain Scale: Hurts a little bit Pain Location: L wrist/forearm Pain Descriptors / Indicators: Discomfort Pain Intervention(s): Monitored during session;Repositioned;Limited activity within patient's tolerance     Hand Dominance     Extremity/Trunk Assessment Upper Extremity Assessment Upper Extremity Assessment: Generalized weakness;LUE deficits/detail LUE Deficits / Details: shoulder pain limiting flexion AROM to about 45*. Pt in prefab, velcro wrist splint. LUE: Unable to fully assess due to immobilization;Unable to fully assess due to pain;Shoulder pain with ROM   Lower Extremity Assessment Lower Extremity Assessment: Defer to PT evaluation;Generalized weakness   Cervical / Trunk Assessment Cervical / Trunk Assessment: Normal   Communication Communication Communication: No difficulties   Cognition Arousal/Alertness: Awake/alert Behavior During Therapy: Flat affect Overall Cognitive Status: Impaired/Different from baseline Area of Impairment: Orientation;Memory;Following commands;Awareness;Problem solving;Attention;Safety/judgement                 Orientation Level: Time;Situation Current Attention Level: Focused Memory: Decreased short-term memory;Decreased recall of precautions Following Commands: Follows one step commands consistently Safety/Judgement: Decreased awareness of deficits Awareness: Intellectual Problem Solving: Slow processing;Difficulty sequencing;Requires verbal cues General Comments: situation "I fell." Reoriented by MD to seizure activity PTA. Benefits from environments with decreased external distractions. Daughter reports cognition is not currently at  baseline.     General Comments  BP sitting: 121/67, sitting: 147/82, after walking in room: 155/70.    Exercises     Shoulder Instructions      Home Living Family/patient expects to be discharged to:: Private residence Living Arrangements: Children;Other (Comment) (adult daughter) Available Help at Discharge: Family;Available PRN/intermittently Type of Home: House Home Access: Level entry     Home Layout: One level     Bathroom Shower/Tub: Teacher, early years/pre: Standard     Home Equipment: Rollator (4 wheels);Shower seat   Additional Comments: lives with daughter, daughter works      Prior Functioning/Environment Prior Level of Function : Independent/Modified Independent             Mobility Comments: daughter reports pt is suppose to use her rollator for all mobility ADLs Comments: Daughter reports pt is mod I with basic ADLs. sponge bathes unless daughter is there to supervise shower transfer.        OT Problem List: Decreased strength;Decreased range of motion;Decreased activity tolerance;Impaired balance (sitting and/or standing);Decreased cognition;Decreased safety awareness;Decreased knowledge of use of DME or AE;Decreased knowledge of precautions;Pain;Impaired UE functional use      OT Treatment/Interventions: Self-care/ADL training;DME and/or AE instruction;Therapeutic activities;Cognitive remediation/compensation;Patient/family education;Balance training    OT Goals(Current goals can be found in the care plan section) Acute Rehab OT Goals Patient Stated Goal: daughter: ST SNF for rehab prior to returning home. Daughter works and unable to provide 24 hr supervision/assistance. OT Goal Formulation: With patient/family Time For Goal Achievement: 11/21/21 Potential to Achieve Goals: Good ADL Goals Pt Will Perform Grooming: with modified independence;sitting;standing Pt Will Perform Upper Body Dressing: with modified independence;sitting Pt  Will Perform Lower Body Dressing: with modified independence;sit to/from stand;sitting/lateral leans Pt Will Transfer to Toilet: with modified independence;ambulating  Pt Will Perform Toileting - Clothing Manipulation and hygiene: with modified independence;sitting/lateral leans;sit to/from stand Pt Will Perform Tub/Shower Transfer: Tub transfer;with supervision;ambulating;shower seat  OT Frequency: Min 2X/week   Barriers to D/C:            Co-evaluation              AM-PAC OT "6 Clicks" Daily Activity     Outcome Measure Help from another person eating meals?: A Little Help from another person taking care of personal grooming?: A Little Help from another person toileting, which includes using toliet, bedpan, or urinal?: A Little Help from another person bathing (including washing, rinsing, drying)?: A Little Help from another person to put on and taking off regular upper body clothing?: A Little Help from another person to put on and taking off regular lower body clothing?: A Little 6 Click Score: 18   End of Session Equipment Utilized During Treatment: Gait belt;Other (comment) (+1 HHA) Nurse Communication: Precautions;Mobility status;Weight bearing status  Activity Tolerance: Patient limited by fatigue Patient left: in chair;with call bell/phone within reach;with nursing/sitter in room;with family/visitor present;with chair alarm set;Other (comment) (MD in room)  OT Visit Diagnosis: Unsteadiness on feet (R26.81);Muscle weakness (generalized) (M62.81);Other symptoms and signs involving cognitive function;History of falling (Z91.81);Pain Pain - Right/Left: Left Pain - part of body: Arm;Shoulder                Time: 0174-9449 OT Time Calculation (min): 27 min Charges:  OT General Charges $OT Visit: 1 Visit OT Evaluation $OT Eval Moderate Complexity: 1 Mod OT Treatments $Self Care/Home Management : 8-22 mins  Tyrone Schimke, OT Acute Rehabilitation Services Office:  [phone]   Amanda Porter 11/07/2021, 10:24 AM

## 2021-11-07 NOTE — Progress Notes (Signed)
Neurology Progress Note  Brief HPI: 75 y.o. female with a PMHx of HTN, DM2, HLD, dementia, NPH s/p VP shunt 2018, stroke with mild left-sided residual, COPD on home oxygen, anxiety, and tardive dyskinesia who presented to the ED 12/5 for evaluation of a fall at home with subsequent witnessed seizure activity. She was found by her caregiver at 8 PM on the floor between her bed and recliner with slurred speech, left eye bleeding, and left eye subconjunctival hemorrhage. During EMS transport, she was seen to have a tonic-clonic seizure with facial, arm, and leg twitching lasting approximately one minute with post-ictal somnolence. She was intubated for airway protection and admitted for further evaluation. Of note, patient was on Wellbutrin 450 mg PO daily prior to hospital admission and possibly took 3 days worth of medication on accident on the day of arrival.   Subjective: No acute overnight events Granddaughter at bedside shares concern that the patient is not quite back to her baseline status with some increased confusion.   Exam: Vitals:   11/07/21 0600 11/07/21 0800  BP: (!) 164/70 128/72  Pulse: 81 (!) 105  Resp: 16 14  Temp:  98.4 F (36.9 C)  SpO2: 100% 95%   Gen: Sitting up on the bedside recliner, eating breakfast, in no acute distress Resp: non-labored breathing on room air. No respiratory distress noted.  Abd: soft, non-distended, non-tender  Neuro: Mental Status: Alert and oriented x2, able to converse and able to state why she came to the hospital.  Naming, repetition and comprehension intact. Speech is fluent Cranial Nerves:PERRL, EOMI, facial sensation intact and symmetrical, hearing intact, phonation normal, shoulder shrug equal with full strength Motor: Able to move all extremities in response to commands, 5/5 strength in all extremities Sensory:Sensation intact to light touch to all extremities Gait: Deferred  Pertinent Labs: CBC    Component Value Date/Time   WBC  12.0 (H) 11/07/2021 0422   RBC 3.39 (L) 11/07/2021 0422   HGB 9.6 (L) 11/07/2021 0422   HCT 31.4 (L) 11/07/2021 0422   PLT 285 11/07/2021 0422   MCV 92.6 11/07/2021 0422   MCH 28.3 11/07/2021 0422   MCHC 30.6 11/07/2021 0422   RDW 13.1 11/07/2021 0422   CMP     Component Value Date/Time   NA 138 11/07/2021 0422   K 3.7 11/07/2021 0422   CL 103 11/07/2021 0422   CO2 27 11/07/2021 0422   GLUCOSE 101 (H) 11/07/2021 0422   BUN 7 (L) 11/07/2021 0422   CREATININE 0.49 11/07/2021 0422   CALCIUM 8.3 (L) 11/07/2021 0422   PROT 7.2 11/04/2021 2144   ALBUMIN 3.6 11/04/2021 2144   AST 36 11/04/2021 2144   ALT 23 11/04/2021 2144   ALKPHOS 62 11/04/2021 2144   BILITOT 0.2 (L) 11/04/2021 2144   GFRNONAA >60 11/07/2021 0422   Imaging Reviewed:  CT head 12/5: No acute intracranial injury.  No calvarial fracture. No acute facial fracture or mandibular dislocation. No acute fracture or listhesis of the cervical spine. Emphysema (ICD10-J43.9).  EEG 12/6 - 12/7: "This study is suggestive of moderate to severe diffuse encephalopathy, nonspecific etiology. No seizures or definite epileptiform discharges were seen throughout the recording."  Assessment: 75 y.o. female PMH as above who presented to ED 12/5 after fall and witnessed GTC seizure, lasting 1 minute and self-limiting. Post seizure, patient was postictal, somnolent, and she was intubated for airway protection. Trauma alerted, work-up was CT head, C-spine and maxillofacial all negative. She was loaded with Keppra 1.5 g,  and put on propofol and Versed. Neurology was consulted for seizure management. There was concerning that she was overdosed by taking all her Monday Tuesday and Wednesday pills at once. These medications include BuSpar, Wellbutrin, Crestor, Plavix and glipizide.   Etiology for her seizure not certain, however it is felt most likely to be due to overdose with Wellbutrin. Other etiology including VP shunt, history of stroke,  fall with possible concussion.  EEG overnight 12/6 - 12/7 with NCSE and without evidence of further seizure activity. EEG revealed slowing suggestive of diffuse encephalopathy.   Impression:  Witnessed seizures were likely caused by Wellbutrin use or inadvertent overdose   Recommendations: 1)Discontinue Wellbutrin, discussed discontinuation with patient and family  2) Continue Keppra at 250 mg BID for three days, then discontinue as seizures were likely provoked in the setting of Wellbutrin use 3) No further neurology recommendations at this time, please call with further questions or concerns    Discharge instructions include: [facility] statutes, patients with seizures are not allowed to drive until they have been seizure-free for six months Use caution when using heavy equipment or power tools. Avoid working on ladders or at heights. Take showers instead of baths. Ensure the water temperature is not too high on the home water heater. Do not go swimming alone. Do not lock yourself in a room alone (i.e. bathroom). When caring for infants or small children, sit down when holding, feeding, or changing them to minimize risk of injury to the child in the event you have a seizure. Maintain good sleep hygiene. Avoid alcohol.   Anibal Henderson, AGACNP-BC [facility] [phone]   NEUROHOSPITALIST ADDENDUM Performed a face to face diagnostic evaluation.   I have reviewed the contents of history and physical exam as documented by PA/ARNP/Resident and agree with above documentation.  I have discussed and formulated the above plan as documented. Edits to the note have been made as needed.  Impression/Key exam findings/Plan: Took several pills of wellbutrin and admitted after fall and seizure. Seizure was likely provoked. She has shown consistent improvement in her mentation over the last couple days. She is oriented to self, place, month but tells me its 2023. She is walking with  therapy, engages in conversation appropriately. Spoke to daughter at bedside. I think this was a provoked seizure from Wellbutrin. cEEG negative. CTH w/o contrast with no acute intracranial abnormality. I do not think an MRI Brain would be helpful given that this was a provoked first time seizure in the setting of wellbutrin overdose. Recommend judicious use of Wellbutrin going forward but it should be fine to slow resume at lower doses. I would recommend doing low dose Keppra 250mg  BID x 3 days, then stop. I would not advise continuing AEDs longterm given this was a provoked event. We will signoff. Please feel free to contact us with any questions or concerns.  Donnetta Simpers, MD [facility] [phone]   If 7pm to 7am, please call on call as listed on AMION.

## 2021-11-07 NOTE — Evaluation (Signed)
Physical Therapy Evaluation Patient Details Name: Amanda Porter MRN: [id number] DOB: 12/20/1945 Today's Date: 11/07/2021  History of Present Illness  75 yo admitted 12/5 after found down at home without O2 on. Pt with seizure enroute and intubated. Extubated 12/6. 12/7 EEG with encephalopathy. Pt with left ulnar fx with splint. PMhx: hydrocephaly with VP shunt, dementia, HTN, anxiety, COPD on home 3L, DM  Clinical Impression  Pt very pleasant and eager to get up to the bathroom. Pt on 3L throughout with SPo2 88-96% with cues for breathing technique, sequence and safety. Pt with decreased balance, cognition, and function who lives alone and does not have 24hr support. Pt has been driving and caring for herself with reports of medication mismanagement and fall. Pt not safe to return home by herself and needs long term ALF. Pt appropriate for SNF based on current function and lack of support as well as cognitive and balance deficits making her a high fall risk and at high risk for continued lack of ability to manage medicine. Will continue to follow to maximize balance, safety and decrease fall risk.        Recommendations for follow up therapy are one component of a multi-disciplinary discharge planning process, led by the attending physician.  Recommendations may be updated based on patient status, additional functional criteria and insurance authorization.  Follow Up Recommendations Skilled nursing-short term rehab (<3 hours/day)    Assistance Recommended at Discharge Frequent or constant Supervision/Assistance  Functional Status Assessment Patient has had a recent decline in their functional status and/or demonstrates limited ability to make significant improvements in function in a reasonable and predictable amount of time  Equipment Recommendations  BSC/3in1    Recommendations for Other Services       Precautions / Restrictions Precautions Precautions: Fall;Other (comment) Required Braces  or Orthoses: Splint/Cast Splint/Cast: left wrist Restrictions Weight Bearing Restrictions: Yes LUE Weight Bearing: Weight bear through elbow only      Mobility  Bed Mobility Overal bed mobility: Needs Assistance Bed Mobility: Supine to Sit     Supine to sit: Supervision     General bed mobility comments: supervision for safety and lines    Transfers Overall transfer level: Needs assistance   Transfers: Sit to/from Stand Sit to Stand: Min guard           General transfer comment: cues for hand placement and guarding for lines and balance    Ambulation/Gait Ambulation/Gait assistance: Min assist Gait Distance (Feet): 150 Feet Assistive device: None Gait Pattern/deviations: Step-through pattern;Decreased stride length;Drifts right/left   Gait velocity interpretation: 1.31 - 2.62 ft/sec, indicative of limited community ambulator   General Gait Details: pt with unsteady gait with min assist for balance with grossly 5 partial LOB during gait with cues for room number and direction  Stairs            Wheelchair Mobility    Modified Rankin (Stroke Patients Only)       Balance Overall balance assessment: Needs assistance Sitting-balance support: No upper extremity supported Sitting balance-Leahy Scale: Good       Standing balance-Leahy Scale: Fair                               Pertinent Vitals/Pain Pain Assessment: No/denies pain    Home Living Family/patient expects to be discharged to:: Private residence Living Arrangements: Alone Available Help at Discharge: Family;Available PRN/intermittently Type of Home: House Home Access: Level entry  Home Layout: One level Home Equipment: Conservation officer, nature (2 wheels);Shower seat      Prior Function Prior Level of Function : Independent/Modified Independent             Mobility Comments: pt reports she walks independently in home and RW in community ADLs Comments: pt states she  does all ADLs, IADLs, drives and cooks     Hand Dominance        Extremity/Trunk Assessment   Upper Extremity Assessment Upper Extremity Assessment: Generalized weakness    Lower Extremity Assessment Lower Extremity Assessment: Generalized weakness    Cervical / Trunk Assessment Cervical / Trunk Assessment: Normal  Communication   Communication: No difficulties  Cognition Arousal/Alertness: Awake/alert Behavior During Therapy: WFL for tasks assessed/performed Overall Cognitive Status: Impaired/Different from baseline Area of Impairment: Orientation;Memory;Following commands                 Orientation Level: Time   Memory: Decreased short-term memory Following Commands: Follows one step commands consistently       General Comments: pt pleasant with decreased awareness of safety and deficits        General Comments      Exercises     Assessment/Plan    PT Assessment Patient needs continued PT services  PT Problem List Decreased strength;Decreased mobility;Decreased safety awareness;Decreased cognition;Decreased balance;Decreased knowledge of use of DME;Decreased activity tolerance;Cardiopulmonary status limiting activity       PT Treatment Interventions Gait training;Functional mobility training;Therapeutic activities;Patient/family education;Cognitive remediation;DME instruction;Therapeutic exercise;Balance training    PT Goals (Current goals can be found in the Care Plan section)  Acute Rehab PT Goals Patient Stated Goal: return home PT Goal Formulation: With patient Time For Goal Achievement: 11/21/21 Potential to Achieve Goals: Fair    Frequency Min 3X/week   Barriers to discharge Decreased caregiver support      Co-evaluation               AM-PAC PT "6 Clicks" Mobility  Outcome Measure Help needed turning from your back to your side while in a flat bed without using bedrails?: A Little Help needed moving from lying on your back to  sitting on the side of a flat bed without using bedrails?: A Little Help needed moving to and from a bed to a chair (including a wheelchair)?: A Little Help needed standing up from a chair using your arms (e.g., wheelchair or bedside chair)?: A Little Help needed to walk in hospital room?: A Lot Help needed climbing 3-5 steps with a railing? : A Lot 6 Click Score: 16    End of Session Equipment Utilized During Treatment: Gait belt;Oxygen Activity Tolerance: Patient tolerated treatment well Patient left: in chair;with call bell/phone within reach;with chair alarm set;with nursing/sitter in room Nurse Communication: Mobility status PT Visit Diagnosis: Other abnormalities of gait and mobility (R26.89);Difficulty in walking, not elsewhere classified (R26.2)    Time: 6294-7654 PT Time Calculation (min) (ACUTE ONLY): 20 min   Charges:   PT Evaluation $PT Eval Moderate Complexity: 1 Mod          Robey Massmann P, PT Acute Rehabilitation Services Pager: [phone] Office: [phone]   Sandy Salaam Ishmel Acevedo 11/07/2021, 9:17 AM

## 2021-11-07 NOTE — Progress Notes (Signed)
PROGRESS NOTE    Amanda Porter  HWE:[id number] DOB: 1946-08-18 DOA: 11/04/2021 PCP: Pleas Koch, NP    Chief Complaint  Patient presents with   Trauma    Brief Narrative:  This is a 75 year old black female that presented to the emergency room from home.  Patient's family is currently away on vacation in the patient's best friend is checking on her daily.  She did not get a response from a phone call immediately went to the patient's house.  Her best friend found her laying next to the bed.  It was noted that the patient did not have her oxygen on which she is dependent on 24 hours a day.  The patient's friend is not aware of any recent illness.  The patient does have mild to moderate dementia and intermittently takes too many of her medications.  She may have taken up to 3 days extra of her medications.  Initially the patient was minimally responsive at house and able to verbalize very basic answers to questions.  In route to the hospital patient had a witnessed seizure.  She does have a history of VP shunt. The patient was intubated in the emergency room after further seizure activity.  Patient has a separate medical record entered [id number]  Subjective:  She is sitting up in bed, NAd, daughter at bedside  Daughter is concerned about her mother's low grade temp, she wants to make sure there is no infection, daughter reports patient has been coughing since after extubated  Daughter is concerned about her right arm /elbow pain, wants to have a x ray of right elbow,  Patient does appear to have limited range of motion left shoulder on exam  Assessment & Plan:   Principal Problem:   Acute respiratory failure requiring reintubation ([place]eder)  Tonic colonic seizure/witnessed -Prompt intubation for airway protection in the ED -Trauma alerted, work-up was CT head, C-spine and maxillofacial all negative. -She was loaded with Keppra 1.5 g, and put on propofol and Versed -Seen by  neurology, seizure etiology uncertain, but likely due to unintentional overdose on Wellbutrin due to memory deficit -Neurology recommended:Discontinue Wellbutrin,  -Neurology recommended: Continue Keppra 250 mg twice daily for 3 days, then discontinue as seizure were likely provoked in the setting of Wellbutrin use -Seizure precautions  Fevers Tmax 101 on 12/6 4pm She does has leukocytosis Will get ua, urine culture, blood culture Cxr obtained on 12/7 , left basilar atelectasis, daughter reports patient has been coughing since after extubation, I did not hear coughing during encounter  Incentive spirometer ordered, out of bed  Acute on chronic hypoxic respiratory failure due to seizure activity -She is now extubated back on 3 L which is her baseline  COPD on home O2 3 L -Not in exacerbation, continue home medication   Nondisplaced ulnar styloid fracture/left wrist.  Pccm Spoke with ortho who Recommend to order removable wrist splint and follow up with ortho outpatient.  Left shoulder pain, limited range of motion Right elbow pain Will get x ray left shoulder and right elbow  Hypokalemia/hypomagnesemia Replaced and improved  Non-insulin-dependent type 2 diabetes, on glipizide at home Hyperlipidemia; continue statin  Anemia of chronic disease and iron deficiency anemia followed by Dr Alen Blew in the past, per Dr Alen Blew, iron deficiency is related to chronic blood losses with GI work-up did not reveal any colon sources Hgb 9.6 today No overt bleeding Takes  iron at home  H/o LULobectomy for lung Ca 2000 Hx DVT/PE/ L femoral embolectomy  1998  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, h/o Dysphagia , currently has no issues with solids/ liquids,  History of CVA with mild left-sided residual weakness /dementia/memory disturbance H/o depression History of tardive dyskinesia Is followed by neurology Dr Jannifer Franklin and psychiatry Dr Casimiro Needle Discontinue  Wellbutrin Continue Plavix/statin  FTT; daughter agreed to SNF placement  Nutritional Assessment: The patient's BMI is: Body mass index is 19.33 kg/m.Marland Kitchen Seen by dietician.  I agree with the assessment and plan as outlined below:  Nutrition Status: Nutrition Problem: Severe Malnutrition Etiology: chronic illness (COPD, dementia, CVA) Signs/Symptoms: severe fat depletion, severe muscle depletion Interventions: Ensure Enlive (each supplement provides 350kcal and 20 grams of protein), MVI  .      DVT prophylaxis: enoxaparin (LOVENOX) injection 40 mg Start: 11/06/21 1015 SCDs Start: 11/05/21 0036   Code Status:full Family Communication: daughter at bedside  Disposition:   Status is: Inpatient  Dispo: The patient is from: home              Anticipated d/c is to: SNF              Anticipated d/c date is: TBD                Consultants:  Critical care neurology  Procedures:  Intubation/extubation EEG  Antimicrobials:   Anti-infectives (From admission, onward)    None           Objective: Vitals:   11/07/21 0800 11/07/21 1000 11/07/21 1100 11/07/21 1200  BP: 128/72 137/65 130/87 (!) 144/62  Pulse: (!) 105 98 94 91  Resp: 14 17 (!) 22 14  Temp: 98.4 F (36.9 C)   98.4 F (36.9 C)  TempSrc: Oral   Oral  SpO2: 95% 99% 96% 100%  Weight:      Height:        Intake/Output Summary (Last 24 hours) at 11/07/2021 1353 Last data filed at 11/07/2021 0930 Gross per 24 hour  Intake 611.58 ml  Output 1180 ml  Net -568.42 ml   Filed Weights   11/05/21 0500 11/06/21 0447 11/07/21 0500  Weight: 53.7 kg 52.1 kg 49.5 kg    Examination:  General exam: alert, awake, communicative,calm, NAD, she is slightly confused about the year/month, she is oriented to place and person, daughter reports patient is not at her baseline Respiratory system: diminished, but no rales, no rhonchi, no wheezing, Respiratory effort normal. Cardiovascular system:  RRR.  Gastrointestinal  system: Abdomen is nondistended, soft and nontender.  Normal bowel sounds heard. Central nervous system: Alert and oriented. No focal neurological deficits. Extremities:  no edema, left wrist in splint Skin: No rashes, lesions or ulcers Psychiatry: impaired memory, pleasant and cooperative .     Data Reviewed: I have personally reviewed following labs and imaging studies  CBC: Recent Labs  Lab 11/04/21 2144 11/04/21 2149 11/04/21 2221 11/05/21 0325 11/06/21 0237 11/07/21 0422  WBC 16.2*  --   --  10.4 12.9* 12.0*  HGB 9.9* 11.6* 9.2* 7.5* 8.4* 9.6*  HCT 34.2* 34.0* 27.0* 24.9* 26.9* 31.4*  MCV 96.6  --   --  93.6 92.4 92.6  PLT 361  --   --  174 249 505    Basic Metabolic Panel: Recent Labs  Lab 11/04/21 2144 11/04/21 2149 11/04/21 2221 11/05/21 0325 11/06/21 0237 11/06/21 1351 11/07/21 0422  NA 136 141 143 135 137 140 138  K 4.0 4.0 3.3* 3.4* 3.6 3.9 3.7  CL 100 101  --  103 103 102 103  CO2 20*  --   --  24 28 30 27   GLUCOSE 161* 158*  --  167* 99 95 101*  BUN 15 15  --  11 6* 6* 7*  CREATININE 1.13* 0.80  --  0.82 0.59 0.71 0.49  CALCIUM 9.8  --   --  8.0* 7.9* 8.5* 8.3*  MG  --   --   --  1.9 1.8  --  2.1    GFR: Estimated Creatinine Clearance: 46.8 mL/min (by C-G formula based on SCr of 0.49 mg/dL).  Liver Function Tests: Recent Labs  Lab 11/04/21 2144  AST 36  ALT 23  ALKPHOS 62  BILITOT 0.2*  PROT 7.2  ALBUMIN 3.6    CBG: Recent Labs  Lab 11/06/21 1600 11/06/21 2032 11/07/21 0337 11/07/21 0736 11/07/21 1229  GLUCAP 140* 174* 103* 93 189*     Recent Results (from the past 240 hour(s))  Resp Panel by RT-PCR (Flu A&B, Covid) Nasopharyngeal Swab     Status: None   Collection Time: 11/04/21 10:05 PM   Specimen: Nasopharyngeal Swab; Nasopharyngeal(NP) swabs in vial transport medium  Result Value Ref Range Status   SARS Coronavirus 2 by RT PCR NEGATIVE NEGATIVE Final    Comment: (NOTE) SARS-CoV-2 target nucleic acids are NOT  DETECTED.  The SARS-CoV-2 RNA is generally detectable in upper respiratory specimens during the acute phase of infection. The lowest concentration of SARS-CoV-2 viral copies this assay can detect is 138 copies/mL. A negative result does not preclude SARS-Cov-2 infection and should not be used as the sole basis for treatment or other patient management decisions. A negative result may occur with  improper specimen collection/handling, submission of specimen other than nasopharyngeal swab, presence of viral mutation(s) within the areas targeted by this assay, and inadequate number of viral copies(<138 copies/mL). A negative result must be combined with clinical observations, patient history, and epidemiological information. The expected result is Negative.  Fact Sheet for Patients:  EntrepreneurPulse.com.au  Fact Sheet for Healthcare Providers:  IncredibleEmployment.be  This test is no t yet approved or cleared by the [place] FDA and  has been authorized for detection and/or diagnosis of SARS-CoV-2 by FDA under an Emergency Use Authorization (EUA). This EUA will remain  in effect (meaning this test can be used) for the duration of the COVID-19 declaration under Section 564(b)(1) of the Act, 21 U.S.C.section 360bbb-3(b)(1), unless the authorization is terminated  or revoked sooner.       Influenza A by PCR NEGATIVE NEGATIVE Final   Influenza B by PCR NEGATIVE NEGATIVE Final    Comment: (NOTE) The Xpert Xpress SARS-CoV-2/FLU/RSV plus assay is intended as an aid in the diagnosis of influenza from Nasopharyngeal swab specimens and should not be used as a sole basis for treatment. Nasal washings and aspirates are unacceptable for Xpert Xpress SARS-CoV-2/FLU/RSV testing.  Fact Sheet for Patients: EntrepreneurPulse.com.au  Fact Sheet for Healthcare Providers: IncredibleEmployment.be  This test is not yet  approved or cleared by the [place] FDA and has been authorized for detection and/or diagnosis of SARS-CoV-2 by FDA under an Emergency Use Authorization (EUA). This EUA will remain in effect (meaning this test can be used) for the duration of the COVID-19 declaration under Section 564(b)(1) of the Act, 21 U.S.C. section 360bbb-3(b)(1), unless the authorization is terminated or revoked.  Performed at [facility], [place] [address] [place], [place] [address]   MRSA Next Gen by PCR, Nasal     Status:  None   Collection Time: 11/05/21  2:23 AM   Specimen: Nasal Mucosa; Nasal Swab  Result Value Ref Range Status   MRSA by PCR Next Gen NOT DETECTED NOT DETECTED Final    Comment: (NOTE) The GeneXpert MRSA Assay (FDA approved for NASAL specimens only), is one component of a comprehensive MRSA colonization surveillance program. It is not intended to diagnose MRSA infection nor to guide or monitor treatment for MRSA infections. Test performance is not FDA approved in patients less than 3 years old. Performed at [facility], [place] [address], [place], [place] [address]          Radiology Studies: DG Chest Port 1 View  Result Date: 11/06/2021 CLINICAL DATA:  Acute respiratory failure EXAM: PORTABLE CHEST 1 VIEW COMPARISON:  11/04/2021 FINDINGS: Endotracheal and enteric tubes are no longer present. Left perihilar operative changes. Stable mild interstitial prominence. Left basilar atelectasis/scarring. No pleural effusion or pneumothorax. IMPRESSION: Left basilar atelectasis/scarring. Electronically Signed   By: Macy Mis M.D.   On: 11/06/2021 08:32        Scheduled Meds:  busPIRone  30 mg Oral BID   Chlorhexidine Gluconate Cloth  6 each Topical Q0600   docusate sodium  100 mg Oral BID   enoxaparin (LOVENOX) injection  40 mg Subcutaneous Daily   feeding supplement  237 mL Oral BID BM   fluticasone furoate-vilanterol  1 puff Inhalation Daily   levETIRAcetam  500 mg  Oral BID   mouth rinse  15 mL Mouth Rinse BID   polyethylene glycol  17 g Oral Daily   rosuvastatin  20 mg Oral Daily   umeclidinium bromide  1 puff Inhalation Daily   Continuous Infusions:  sodium chloride Stopped (11/06/21 1609)     LOS: 2 days   Time spent: 4mins Greater than 50% of this time was spent in counseling, explanation of diagnosis, planning of further management, and coordination of care.   Voice Recognition Viviann Spare dictation system was used to create this note, attempts have been made to correct errors. Please contact the author with questions and/or clarifications.   Florencia Reasons, MD PhD FACP [facility]  Available via Epic secure chat 7am-7pm for nonurgent issues Please page for urgent issues To page the attending provider between 7A-7P or the covering provider during after hours 7P-7A, please log into the web site [email or web address] and access using universal [place] password for that web site. If you do not have the password, please call the hospital operator.    11/07/2021, 1:53 PM

## 2021-11-07 NOTE — Progress Notes (Signed)
Initial Nutrition Assessment  DOCUMENTATION CODES:   Severe malnutrition in context of chronic illness  INTERVENTION:   - Ensure Enlive po BID, each supplement provides 350 kcal and 20 grams of protein  - MVI with minerals daily  - Encourage PO intake  NUTRITION DIAGNOSIS:   Severe Malnutrition related to chronic illness (COPD, dementia, CVA) as evidenced by severe fat depletion, severe muscle depletion.  GOAL:   Patient will meet greater than or equal to 90% of their needs  MONITOR:   PO intake, Supplement acceptance, Weight trends, Labs  REASON FOR ASSESSMENT:   Rounds    ASSESSMENT:   75 year old female who presented to the ED on 12/05 after a fall. PMH of CVA, dementia, depression, T2DM, dysphagia, tardive dyskinesia, VP shunt, HTN, HLD, COPD. En route to the ED, pt experienced a seizure. Pt required intubation in the ED.  12/06 - extubated 12/07 - clear liquid diet, later advanced to Carb Modified  Discussed pt with RN and during ICU rounds. RN reports pt with variable PO intake and may benefit from an oral nutrition supplement. RN reports pt eats better when family is present because they encourage her.  Spoke with pt and daughter at bedside. Pt's daughter reports that pt was eating well at breakfast until they arrived this morning then began complaining about the food. Pt's daughter reports that pt is a very picky eater at baseline. Pt does not usually eat full meals. For breakfast, pt may have fruit or a hard boiled egg. For lunch, pt may have a sandwich. For dinner, pt will eat a full meal with a protein, starch, and sides. Pt typically drinks 1-2 Boost supplements daily at home.  Pt's daughter reports that pt's UBW is 115-116 lbs. Sometimes pt will weigh up to 119 lbs. Pt's daughter reports that recently pt's weight dropped down to 112 lbs. Current weight documented as 109.13 lbs.  Pt amenable to consuming oral nutrition supplements during admission. RD to order  Ensure Enlive BID. RD will also order daily MVI with minerals.  Admit weight: 53.7 kg Current weight: 49.5 kg  Meal Completion: 15-90%  Medications reviewed and include: colace, miralax  Labs reviewed: BUN 7, hemoglobin 9.6, WBC 12.0 CBG's: 93-174 x 24 hours  UOP: 1180 ml x 24 hours I/O's: -506 ml since admit  NUTRITION - FOCUSED PHYSICAL EXAM:  Flowsheet Row Most Recent Value  Orbital Region Moderate depletion  Upper Arm Region Severe depletion  Thoracic and Lumbar Region Severe depletion  Buccal Region Moderate depletion  Temple Region Moderate depletion  Clavicle Bone Region Severe depletion  Clavicle and Acromion Bone Region Severe depletion  Scapular Bone Region Moderate depletion  Dorsal Hand Moderate depletion  Patellar Region Severe depletion  Anterior Thigh Region Severe depletion  Posterior Calf Region Severe depletion  Edema (RD Assessment) None  Hair Reviewed  Eyes Reviewed  Mouth Reviewed  Skin Reviewed  Nails Reviewed       Diet Order:   Diet Order             Diet Carb Modified Fluid consistency: Thin; Room service appropriate? Yes  Diet effective now                   EDUCATION NEEDS:   Education needs have been addressed  Skin:  Skin Assessment: Reviewed RN Assessment  Last BM:  11/06/21  Height:   Ht Readings from Last 1 Encounters:  11/04/21 5\' 3"  (1.6 m)    Weight:   Wt Readings  from Last 1 Encounters:  11/07/21 49.5 kg    BMI:  Body mass index is 19.33 kg/m.  Estimated Nutritional Needs:   Kcal:  1500-1700  Protein:  70-80 grams  Fluid:  1.5-1.7 L    Gustavus Bryant, MS, RD, LDN Inpatient Clinical Dietitian Please see AMiON for contact information.

## 2021-11-08 LAB — CBC WITH DIFFERENTIAL/PLATELET
Abs Immature Granulocytes: 0.02 10*3/uL (ref 0.00–0.07)
Basophils Absolute: 0.1 10*3/uL (ref 0.0–0.1)
Basophils Relative: 1 %
Eosinophils Absolute: 0.3 10*3/uL (ref 0.0–0.5)
Eosinophils Relative: 3 %
HCT: 31.3 % — ABNORMAL LOW (ref 36.0–46.0)
Hemoglobin: 9.7 g/dL — ABNORMAL LOW (ref 12.0–15.0)
Immature Granulocytes: 0 %
Lymphocytes Relative: 20 %
Lymphs Abs: 1.9 10*3/uL (ref 0.7–4.0)
MCH: 28.4 pg (ref 26.0–34.0)
MCHC: 31 g/dL (ref 30.0–36.0)
MCV: 91.8 fL (ref 80.0–100.0)
Monocytes Absolute: 1.1 10*3/uL — ABNORMAL HIGH (ref 0.1–1.0)
Monocytes Relative: 11 %
Neutro Abs: 6.3 10*3/uL (ref 1.7–7.7)
Neutrophils Relative %: 65 %
Platelets: 298 10*3/uL (ref 150–400)
RBC: 3.41 MIL/uL — ABNORMAL LOW (ref 3.87–5.11)
RDW: 13 % (ref 11.5–15.5)
WBC: 9.5 10*3/uL (ref 4.0–10.5)
nRBC: 0 % (ref 0.0–0.2)

## 2021-11-08 LAB — GLUCOSE, CAPILLARY
Glucose-Capillary: 116 mg/dL — ABNORMAL HIGH (ref 70–99)
Glucose-Capillary: 177 mg/dL — ABNORMAL HIGH (ref 70–99)
Glucose-Capillary: 135 mg/dL — ABNORMAL HIGH (ref 70–99)
Glucose-Capillary: 197 mg/dL — ABNORMAL HIGH (ref 70–99)

## 2021-11-08 LAB — URINALYSIS, ROUTINE W REFLEX MICROSCOPIC
Bilirubin Urine: NEGATIVE
Glucose, UA: 50 mg/dL — AB
Hgb urine dipstick: NEGATIVE
Ketones, ur: NEGATIVE mg/dL
Leukocytes,Ua: NEGATIVE
Nitrite: NEGATIVE
Protein, ur: NEGATIVE mg/dL
Specific Gravity, Urine: 1.016 (ref 1.005–1.030)
pH: 7 (ref 5.0–8.0)

## 2021-11-08 LAB — BASIC METABOLIC PANEL
Anion gap: 10 (ref 5–15)
BUN: 12 mg/dL (ref 8–23)
CO2: 31 mmol/L (ref 22–32)
Calcium: 8.4 mg/dL — ABNORMAL LOW (ref 8.9–10.3)
Chloride: 100 mmol/L (ref 98–111)
Creatinine, Ser: 0.6 mg/dL (ref 0.44–1.00)
GFR, Estimated: >60 mL/min (ref >60)
Glucose, Bld: 137 mg/dL — ABNORMAL HIGH (ref 70–99)
Potassium: 3.6 mmol/L (ref 3.5–5.1)
Sodium: 141 mmol/L (ref 135–145)

## 2021-11-08 LAB — EXPECTORATED SPUTUM ASSESSMENT W GRAM STAIN, RFLX TO RESP C

## 2021-11-08 LAB — TSH: TSH: 2.463 u[IU]/mL (ref 0.350–4.500)

## 2021-11-08 LAB — HEMOGLOBIN A1C
Hgb A1c MFr Bld: 7.2 % — ABNORMAL HIGH (ref 4.8–5.6)
Mean Plasma Glucose: 159.94 mg/dL

## 2021-11-08 MED ORDER — INSULIN ASPART 100 UNIT/ML IJ SOLN
0.0000 [IU] | Freq: Three times a day (TID) | INTRAMUSCULAR | Status: DC
  Administered 2021-11-08: 1 [IU] via SUBCUTANEOUS
  Administered 2021-11-09 (×2): 2 [IU] via SUBCUTANEOUS
  Administered 2021-11-09: 1 [IU] via SUBCUTANEOUS
  Administered 2021-11-10: 3 [IU] via SUBCUTANEOUS
  Administered 2021-11-10: 1 [IU] via SUBCUTANEOUS
  Administered 2021-11-11: 3 [IU] via SUBCUTANEOUS
  Administered 2021-11-11 (×2): 2 [IU] via SUBCUTANEOUS
  Administered 2021-11-12: 1 [IU] via SUBCUTANEOUS

## 2021-11-08 MED ORDER — HYDROCORTISONE 0.5 % EX CREA
TOPICAL_CREAM | Freq: Two times a day (BID) | CUTANEOUS | Status: DC | PRN
  Filled 2021-11-08: qty 28.35

## 2021-11-08 MED ORDER — BUSPIRONE HCL 15 MG PO TABS
45.0000 mg | ORAL_TABLET | Freq: Every day | ORAL | Status: DC
  Administered 2021-11-09 – 2021-11-12 (×4): 45 mg via ORAL
  Filled 2021-11-08 (×4): qty 3

## 2021-11-08 MED ORDER — SENNOSIDES-DOCUSATE SODIUM 8.6-50 MG PO TABS
1.0000 | ORAL_TABLET | Freq: Two times a day (BID) | ORAL | Status: DC
  Administered 2021-11-08 – 2021-11-12 (×8): 1 via ORAL
  Filled 2021-11-08 (×8): qty 1

## 2021-11-08 MED ORDER — SODIUM CHLORIDE 0.9 % IV SOLN: 250.0000 mL | INTRAVENOUS | Status: DC | PRN

## 2021-11-08 MED ORDER — LORAZEPAM 0.5 MG PO TABS: 0.5000 mg | ORAL_TABLET | Freq: Two times a day (BID) | ORAL | Status: DC | PRN

## 2021-11-08 MED ORDER — BUSPIRONE HCL 15 MG PO TABS
30.0000 mg | ORAL_TABLET | Freq: Every day | ORAL | Status: DC
  Administered 2021-11-08 – 2021-11-11 (×4): 30 mg via ORAL
  Filled 2021-11-08 (×5): qty 2

## 2021-11-08 MED ORDER — IPRATROPIUM-ALBUTEROL 0.5-2.5 (3) MG/3ML IN SOLN
3.0000 mL | Freq: Every morning | RESPIRATORY_TRACT | Status: DC
  Administered 2021-11-09 – 2021-11-12 (×4): 3 mL via RESPIRATORY_TRACT
  Filled 2021-11-08 (×4): qty 3

## 2021-11-08 MED ORDER — LEVETIRACETAM 250 MG PO TABS
250.0000 mg | ORAL_TABLET | Freq: Two times a day (BID) | ORAL | Status: AC
  Administered 2021-11-08 – 2021-11-10 (×5): 250 mg via ORAL
  Filled 2021-11-08 (×6): qty 1

## 2021-11-08 NOTE — Progress Notes (Signed)
Mobility Specialist Progress Note:   11/08/21 1100  Mobility  Activity Ambulated in hall  Level of Assistance Standby assist, set-up cues, supervision of patient - no hands on  Assistive Device Front wheel walker  Distance Ambulated (ft) 220 ft  Mobility Ambulated independently in hallway  Mobility Response Tolerated well  Mobility performed by Mobility specialist  $Mobility charge 1 Mobility   Session completed on 4LO2. Pt asx during ambulation. Uses rollator at home. Pt back in bed with bed alarm on.   Nelta Numbers Mobility Specialist  Phone [phone]

## 2021-11-08 NOTE — Progress Notes (Addendum)
PROGRESS NOTE    Amanda Porter  GQQ:[id number] DOB: 1946/06/02 DOA: 11/04/2021 PCP: Pleas Koch, NP    Chief Complaint  Patient presents with   Trauma    Brief Narrative:  This is a 75 year old black female that presented to the emergency room from home.  Patient's family is currently away on vacation in the patient's best friend is checking on her daily.  She did not get a response from a phone call immediately went to the patient's house.  Her best friend found her laying next to the bed.  It was noted that the patient did not have her oxygen on which she is dependent on 24 hours a day.  The patient's friend is not aware of any recent illness.  The patient does have mild to moderate dementia and intermittently takes too many of her medications.  She may have taken up to 3 days extra of her medications.  Initially the patient was minimally responsive at house and able to verbalize very basic answers to questions.  In route to the hospital patient had a witnessed seizure.  She does have a history of VP shunt. The patient was intubated in the emergency room after further seizure activity.  Patient has a separate medical record entered [id number]  Subjective:  She was napping  up in bed, NAD, daughter at bedside   Daughter is concerned about her mother's low grade temp in the last few days, daughter reports her mother felt warm yesterday evening but oral temp was 72, daughter thinks her mother is a mouth breather, she does not  think oral temp is accurate, she prefers rectal temp to be checked   -she is concerned that her mother is still more confused than her baseline, she is concerned that mother has infection daughter reports patient has been coughing intermittently since after extubated , patient has exertional wheezing, which is at baseline  RN reports patient has  1 occurrence of small amount black tarry stool this am  She is slightly confused but able to self correct, she  remembered what she had for lunch, daughter states patient is not at baseline   Assessment & Plan:   Principal Problem:   Acute respiratory failure requiring reintubation ([place]) Active Problems:   Protein-calorie malnutrition, severe  Tonic colonic seizure/witnessed - intubated for airway protection in the ED -Trauma alerted, work-up was CT head, C-spine and maxillofacial all negative. -She was loaded with Keppra 1.5 g, and put on propofol and Versed -Seen by neurology, seizure etiology uncertain, but likely due to unintentional overdose on Wellbutrin due to memory deficit -Neurology recommended:Discontinue Wellbutrin,  -Neurology recommended: Continue Keppra 250 mg twice daily for 3 days, then discontinue as seizure were likely provoked in the setting of Wellbutrin use -Seizure precautions  Fevers Tmax 101 on 12/6 4pm She does has leukocytosis, wbc normalized on 12/9 Will get ua, urine culture, blood culture, sputum culture  Cxr obtained on 12/7 , left basilar atelectasis, daughter reports patient has been coughing since after extubation, I did not hear coughing during encounter  Incentive spirometer ordered, out of bed, f/u on culture   Acute on chronic hypoxic respiratory failure due to seizure activity -She is now extubated back on 3 L which is her baseline  COPD on home O2 3 L -Not in exacerbation, continue home medication Daughter reports patient has exertional wheezing at baseline, daughter prefers patient get inhalor in am then one time nebs scheduled at 10 am in addition to prn nebs  Nondisplaced ulnar styloid fracture/left wrist.  Pccm Spoke with ortho who Recommend to order removable wrist splint and follow up with ortho outpatient.  Left shoulder pain, limited range of motion Right elbow pain x ray left shoulder and right elbow no acute findings   Hypokalemia/hypomagnesemia Replaced and improved  Non-insulin-dependent type 2 diabetes, on glipizide at home, on ssi  here Hyperlipidemia; continue statin  Anemia of chronic disease and iron deficiency anemia followed by Dr Alen Blew in the past, per Dr Alen Blew, iron deficiency is related to chronic blood losses with GI work-up did not reveal any colon sources Hgb 9.6-9.7 RN reports "1 occurrence of small amount black tarry stool" on 12/9, will check FOBT, anemia work up [employer]  iron at home  H/o LULobectomy for lung Ca 2000 Hx DVT/PE/ L femoral embolectomy 1998  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, h/o Dysphagia , currently has no issues with solids/ liquids,  History of CVA with mild left-sided residual weakness /dementia/memory disturbance H/o depression History of tardive dyskinesia Is followed by neurology Dr Jannifer Franklin and psychiatry Dr Casimiro Needle Discontinue Wellbutrin, daughter reports patient takes buspar 45mg  qam and 30mg  qpm, she also takes ativan 0.5 qhs at home , this has been held since admission, daughter agrees to change ativan 0.5mg  qhs Continue statin Hold plavix for now until rule out GI bleed  FTT; daughter agreed to SNF placement  Nutritional Assessment: The patient's BMI is: Body mass index is 19.33 kg/m.Marland Kitchen Seen by dietician.  I agree with the assessment and plan as outlined below:  Nutrition Status: Nutrition Problem: Severe Malnutrition Etiology: chronic illness (COPD, dementia, CVA) Signs/Symptoms: severe fat depletion, severe muscle depletion Interventions: Ensure Enlive (each supplement provides 350kcal and 20 grams of protein), MVI  .      DVT prophylaxis: enoxaparin (LOVENOX) injection 40 mg Start: 11/06/21 1015 SCDs Start: 11/05/21 0036   Code Status:full Family Communication: daughter at bedside  Disposition:   Status is: Inpatient  Dispo: The patient is from: home              Anticipated d/c is to: SNF              Anticipated d/c date is: TBD                Consultants:  Critical care neurology  Procedures:   Intubation/extubation EEG  Antimicrobials:   Anti-infectives (From admission, onward)    None           Objective: Vitals:   11/08/21 0000 11/08/21 0327 11/08/21 0725 11/08/21 0859  BP: (!) 155/72 117/60 (!) 150/68   Pulse: 87 95 86   Resp:  18 16   Temp:  98 F (36.7 C) (!) 97.5 F (36.4 C)   TempSrc:   Oral   SpO2: 100% 99% 99% 98%  Weight:      Height:        Intake/Output Summary (Last 24 hours) at 11/08/2021 1332 Last data filed at 11/07/2021 1400 Gross per 24 hour  Intake 200 ml  Output --  Net 200 ml   Filed Weights   11/05/21 0500 11/06/21 0447 11/07/21 0500  Weight: 53.7 kg 52.1 kg 49.5 kg    Examination:  General exam: alert, awake, communicative,calm, NAD, she is slightly confused but able to self correct, daughter reports patient is not at her baseline Respiratory system: diminished, but no rales, no rhonchi, ? Mild wheezes at basis ?, Respiratory effort normal. Cardiovascular system:  RRR.  Gastrointestinal system: Abdomen is nondistended, soft and nontender.  Normal bowel sounds heard. Central nervous system: Alert and oriented. No focal neurological deficits. Extremities:  no edema, left wrist in splint Skin: No rashes, lesions or ulcers Psychiatry: impaired memory, pleasant and cooperative .     Data Reviewed: I have personally reviewed following labs and imaging studies  CBC: Recent Labs  Lab 11/04/21 2144 11/04/21 2149 11/04/21 2221 11/05/21 0325 11/06/21 0237 11/07/21 0422 11/08/21 0508  WBC 16.2*  --   --  10.4 12.9* 12.0* 9.5  NEUTROABS  --   --   --   --   --   --  6.3  HGB 9.9*   < > 9.2* 7.5* 8.4* 9.6* 9.7*  HCT 34.2*   < > 27.0* 24.9* 26.9* 31.4* 31.3*  MCV 96.6  --   --  93.6 92.4 92.6 91.8  PLT 361  --   --  174 249 285 298   < > = values in this interval not displayed.    Basic Metabolic Panel: Recent Labs  Lab 11/05/21 0325 11/06/21 0237 11/06/21 1351 11/07/21 0422 11/08/21 0508  NA 135 137 140 138 141   K 3.4* 3.6 3.9 3.7 3.6  CL 103 103 102 103 100  CO2 24 28 30 27 31   GLUCOSE 167* 99 95 101* 137*  BUN 11 6* 6* 7* 12  CREATININE 0.82 0.59 0.71 0.49 0.60  CALCIUM 8.0* 7.9* 8.5* 8.3* 8.4*  MG 1.9 1.8  --  2.1  --     GFR: Estimated Creatinine Clearance: 46.8 mL/min (by C-G formula based on SCr of 0.6 mg/dL).  Liver Function Tests: Recent Labs  Lab 11/04/21 2144  AST 36  ALT 23  ALKPHOS 62  BILITOT 0.2*  PROT 7.2  ALBUMIN 3.6    CBG: Recent Labs  Lab 11/07/21 1612 11/07/21 1927 11/07/21 2320 11/08/21 0755 11/08/21 1114  GLUCAP 199* 208* 145* 116* 177*     Recent Results (from the past 240 hour(s))  Resp Panel by RT-PCR (Flu A&B, Covid) Nasopharyngeal Swab     Status: None   Collection Time: 11/04/21 10:05 PM   Specimen: Nasopharyngeal Swab; Nasopharyngeal(NP) swabs in vial transport medium  Result Value Ref Range Status   SARS Coronavirus 2 by RT PCR NEGATIVE NEGATIVE Final    Comment: (NOTE) SARS-CoV-2 target nucleic acids are NOT DETECTED.  The SARS-CoV-2 RNA is generally detectable in upper respiratory specimens during the acute phase of infection. The lowest concentration of SARS-CoV-2 viral copies this assay can detect is 138 copies/mL. A negative result does not preclude SARS-Cov-2 infection and should not be used as the sole basis for treatment or other patient management decisions. A negative result may occur with  improper specimen collection/handling, submission of specimen other than nasopharyngeal swab, presence of viral mutation(s) within the areas targeted by this assay, and inadequate number of viral copies(<138 copies/mL). A negative result must be combined with clinical observations, patient history, and epidemiological information. The expected result is Negative.  Fact Sheet for Patients:  EntrepreneurPulse.com.au  Fact Sheet for Healthcare Providers:  IncredibleEmployment.be  This test is no t yet  approved or cleared by the [place] FDA and  has been authorized for detection and/or diagnosis of SARS-CoV-2 by FDA under an Emergency Use Authorization (EUA). This EUA will remain  in effect (meaning this test can be used) for the duration of the COVID-19 declaration under Section 564(b)(1) of the Act, 21 U.S.C.section 360bbb-3(b)(1), unless the authorization is terminated  or revoked sooner.       Influenza A by PCR NEGATIVE NEGATIVE Final   Influenza B by PCR NEGATIVE NEGATIVE Final    Comment: (NOTE) The Xpert Xpress SARS-CoV-2/FLU/RSV plus assay is intended as an aid in the diagnosis of influenza from Nasopharyngeal swab specimens and should not be used as a sole basis for treatment. Nasal washings and aspirates are unacceptable for Xpert Xpress SARS-CoV-2/FLU/RSV testing.  Fact Sheet for Patients: EntrepreneurPulse.com.au  Fact Sheet for Healthcare Providers: IncredibleEmployment.be  This test is not yet approved or cleared by the [place] FDA and has been authorized for detection and/or diagnosis of SARS-CoV-2 by FDA under an Emergency Use Authorization (EUA). This EUA will remain in effect (meaning this test can be used) for the duration of the COVID-19 declaration under Section 564(b)(1) of the Act, 21 U.S.C. section 360bbb-3(b)(1), unless the authorization is terminated or revoked.  Performed at [facility], [place] [address] [place], [place] [address]   MRSA Next Gen by PCR, Nasal     Status: None   Collection Time: 11/05/21  2:23 AM   Specimen: Nasal Mucosa; Nasal Swab  Result Value Ref Range Status   MRSA by PCR Next Gen NOT DETECTED NOT DETECTED Final    Comment: (NOTE) The GeneXpert MRSA Assay (FDA approved for NASAL specimens only), is one component of a comprehensive MRSA colonization surveillance program. It is not intended to diagnose MRSA infection nor to guide or monitor treatment for MRSA  infections. Test performance is not FDA approved in patients less than 41 years old. Performed at [facility], [place] [address] [place], [place] [address]   Culture, blood (Routine X 2) w Reflex to ID Panel     Status: None (Preliminary result)   Collection Time: 11/08/21  5:08 AM   Specimen: BLOOD  Result Value Ref Range Status   Specimen Description BLOOD SITE NOT SPECIFIED  Final   Special Requests   Final    BOTTLES DRAWN AEROBIC AND ANAEROBIC Blood Culture adequate volume   Culture   Final    NO GROWTH < 12 HOURS Performed at [facility], [place] [address], [place], [place] [address]    Report Status PENDING  Incomplete  Culture, blood (Routine X 2) w Reflex to ID Panel     Status: None (Preliminary result)   Collection Time: 11/08/21  5:08 AM   Specimen: BLOOD  Result Value Ref Range Status   Specimen Description BLOOD SITE NOT SPECIFIED  Final   Special Requests   Final    BOTTLES DRAWN AEROBIC AND ANAEROBIC Blood Culture adequate volume   Culture   Final    NO GROWTH < 12 HOURS Performed at [facility], [place] [address], [place], [place] [address]    Report Status PENDING  Incomplete  Expectorated Sputum Assessment w Gram Stain, Rflx to Resp Cult     Status: None   Collection Time: 11/08/21  5:39 AM   Specimen: Expectorated Sputum  Result Value Ref Range Status   Specimen Description EXPECTORATED SPUTUM  Final   Special Requests NONE  Final   Sputum evaluation   Final    THIS SPECIMEN IS ACCEPTABLE FOR SPUTUM CULTURE Performed at [facility], [place] [address] [place], [place] [address]    Report Status 11/08/2021 FINAL  Final  Culture, Respiratory w Gram Stain     Status: None (Preliminary result)   Collection Time: 11/08/21  5:39 AM  Result  Value Ref Range Status   Specimen Description EXPECTORATED SPUTUM  Final   Special Requests NONE Reflexed from [id number]  Final   Gram Stain   Final    RARE WBC PRESENT,BOTH PMN AND MONONUCLEAR ABUNDANT  GRAM POSITIVE COCCI IN PAIRS IN CLUSTERS RARE GRAM NEGATIVE RODS Performed at [facility], [place] [address], [place], [place] [address]    Culture PENDING  Incomplete   Report Status PENDING  Incomplete         Radiology Studies: DG Elbow 2 Views Right  Result Date: 11/07/2021 CLINICAL DATA:  Recent trauma, pain EXAM: RIGHT ELBOW - 2 VIEW COMPARISON:  None. FINDINGS: There is no evidence of fracture, dislocation, or joint effusion. There is no evidence of arthropathy or other focal bone abnormality. Soft tissues are unremarkable. IMPRESSION: No radiographic abnormality is seen in the right elbow. Electronically Signed   By: Elmer Picker M.D.   On: 11/07/2021 16:38   DG Shoulder Left  Result Date: 11/07/2021 CLINICAL DATA:  Recent trauma, pain EXAM: LEFT SHOULDER - 2+ VIEW COMPARISON:  None. FINDINGS: No fracture or dislocation is seen. Osteopenia is seen in bony structures. There are surgical clips in the mediastinum. IMPRESSION: No significant radiographic abnormality is seen in the left shoulder. Electronically Signed   By: Elmer Picker M.D.   On: 11/07/2021 16:38        Scheduled Meds:  busPIRone  30 mg Oral BID   Chlorhexidine Gluconate Cloth  6 each Topical Q0600   docusate sodium  100 mg Oral BID   enoxaparin (LOVENOX) injection  40 mg Subcutaneous Daily   feeding supplement  237 mL Oral BID BM   fluticasone furoate-vilanterol  1 puff Inhalation Daily   guaiFENesin  600 mg Oral BID   levETIRAcetam  500 mg Oral BID   mouth rinse  15 mL Mouth Rinse BID   polyethylene glycol  17 g Oral Daily   rosuvastatin  20 mg Oral Daily   umeclidinium bromide  1 puff Inhalation Daily   Continuous Infusions:  sodium chloride Stopped (11/06/21 1609)     LOS: 3 days   Time spent: 28mins Greater than 50% of this time was spent in counseling, explanation of diagnosis, planning of further management, and coordination of care.   Voice Recognition Viviann Spare dictation  system was used to create this note, attempts have been made to correct errors. Please contact the author with questions and/or clarifications.   Florencia Reasons, MD PhD FACP [facility]  Available via Epic secure chat 7am-7pm for nonurgent issues Please page for urgent issues To page the attending provider between 7A-7P or the covering provider during after hours 7P-7A, please log into the web site [email or web address] and access using universal [place] password for that web site. If you do not have the password, please call the hospital operator.    11/08/2021, 1:32 PM

## 2021-11-08 NOTE — Plan of Care (Signed)

## 2021-11-08 NOTE — [place] FL2 (Signed)
[place] MEDICAID FL2 LEVEL OF CARE SCREENING TOOL     IDENTIFICATION  Patient Name: Amanda Porter Birthdate: [birth year] Sex: female Admission Date (Current Location): 11/04/2021  [facility] and [place] Number:  Herbalist and Address:  The . [facility], [place] [address], [place], [place] [address]      Provider Number: [id number]  Attending Physician Name and Address:  Florencia Reasons, MD  Relative Name and Phone Number:       Current Level of Care: Hospital Recommended Level of Care: [place] Prior Approval Number:    Date Approved/Denied:   PASRR Number: [id number]  Discharge Plan: SNF    Current Diagnoses: Patient Active Problem List   Diagnosis Date Noted   Protein-calorie malnutrition, severe 11/07/2021   Acute respiratory failure requiring reintubation ([place]) 11/05/2021    Orientation RESPIRATION BLADDER Height & Weight     Self, Time, Situation  Normal Continent Weight: 109 lb 2 oz (49.5 kg) Height:  5\' 3"  (160 cm)  BEHAVIORAL SYMPTOMS/MOOD NEUROLOGICAL BOWEL NUTRITION STATUS      Continent Diet  AMBULATORY STATUS COMMUNICATION OF NEEDS Skin   Limited Assist Verbally Normal                       Personal Care Assistance Level of Assistance  Bathing, Feeding, Dressing Bathing Assistance: Limited assistance Feeding assistance: Independent Dressing Assistance: Limited assistance     Functional Limitations Info  Sight, Hearing, Speech Sight Info: Adequate Hearing Info: Adequate Speech Info: Adequate    SPECIAL CARE FACTORS FREQUENCY  OT (By licensed OT), PT (By licensed PT)     PT Frequency: 5x a week OT Frequency: 5x a week            Contractures Contractures Info: Not present    Additional Factors Info  Code Status, Allergies Code Status Info: Full Allergies Info: Lexapro (Escitalopram)   Neosporin (Bacitracin-polymyxin B)           Current Medications (11/08/2021):  This is the current  hospital active medication list Current Facility-Administered Medications  Medication Dose Route Frequency Provider Last Rate Last Admin   0.9 %  sodium chloride infusion  250 mL Intravenous Continuous Frederik Pear, MD   Stopped at 11/06/21 1609   acetaminophen (TYLENOL) tablet 650 mg  650 mg Oral Q4H PRN Donnamae Jude, RPH   650 mg at 11/06/21 2024   busPIRone (BUSPAR) tablet 30 mg  30 mg Oral Vonzell Schlatter, MD       [START ON 11/09/2021] busPIRone (BUSPAR) tablet 45 mg  45 mg Oral Daily Florencia Reasons, MD       Chlorhexidine Gluconate Cloth 2 % PADS 6 each  6 each Topical Q0600 Rosalin Hawking, MD   6 each at 11/06/21 0839   docusate sodium (COLACE) capsule 100 mg  100 mg Oral BID Donnamae Jude, RPH   100 mg at 11/08/21 1057   docusate sodium (COLACE) capsule 100 mg  100 mg Oral BID PRN Donnamae Jude, RPH   100 mg at 11/07/21 1012   enoxaparin (LOVENOX) injection 40 mg  40 mg Subcutaneous Daily Mick Sell, PA-C   40 mg at 11/08/21 1059   feeding supplement (ENSURE ENLIVE / ENSURE PLUS) liquid 237 mL  237 mL Oral BID BM Florencia Reasons, MD   237 mL at 11/08/21 1102   fluticasone furoate-vilanterol (BREO ELLIPTA) 200-25 MCG/ACT 1 puff  1 puff Inhalation Daily Andres Labrum  D, PA-C   1 puff at 11/08/21 0859   guaiFENesin (MUCINEX) 12 hr tablet 600 mg  600 mg Oral BID Florencia Reasons, MD   600 mg at 11/08/21 1058   insulin aspart (novoLOG) injection 0-9 Units  0-9 Units Subcutaneous TID WC Florencia Reasons, MD       ipratropium-albuterol (DUONEB) 0.5-2.5 (3) MG/3ML nebulizer solution 3 mL  3 mL Nebulization Q6H PRN Agarwala, Einar Grad, MD   3 mL at 11/08/21 0143   [START ON 11/09/2021] ipratropium-albuterol (DUONEB) 0.5-2.5 (3) MG/3ML nebulizer solution 3 mL  3 mL Nebulization q morning Florencia Reasons, MD       levETIRAcetam (KEPPRA) tablet 250 mg  250 mg Oral BID Florencia Reasons, MD       LORazepam (ATIVAN) tablet 0.5 mg  0.5 mg Oral BID BM & HS PRN Florencia Reasons, MD       MEDLINE mouth rinse  15 mL Mouth Rinse BID Agarwala, Ravi, MD   15 mL at  11/08/21 1103   ondansetron (ZOFRAN) injection 4 mg  4 mg Intravenous Q6H PRN Deland Pretty, MD       polyethylene glycol (MIRALAX / GLYCOLAX) packet 17 g  17 g Oral Daily Donnamae Jude, RPH   17 g at 11/08/21 1058   polyethylene glycol (MIRALAX / GLYCOLAX) packet 17 g  17 g Oral Daily PRN Benetta Spar D, RPH       rosuvastatin (CRESTOR) tablet 20 mg  20 mg Oral Daily Andres Labrum D, PA-C   20 mg at 11/08/21 1058   umeclidinium bromide (INCRUSE ELLIPTA) 62.5 MCG/ACT 1 puff  1 puff Inhalation Daily Andres Labrum D, PA-C   1 puff at 11/08/21 2774     Discharge Medications: Please see discharge summary for a list of discharge medications.  Relevant Imaging Results:  Relevant Lab Results:   Additional Information JOI:[id number]  Emeterio Reeve, LCSW

## 2021-11-08 NOTE — TOC Initial Note (Signed)
Transition of Care [facility]) - Initial/Assessment Note    Patient Details  Name: Amanda Porter MRN: [id number] Date of Birth: [birth year]  [facility] of [facility]) CM/SW Contact:    Emeterio Reeve, LCSW Phone Number: 11/08/2021, 3:24 PM  Clinical Narrative:                  CSW received SNF consult. CSW met with pt and daughter Amanda Porter at bedside. CSW introduced self and explained role at the hospital. Pt reports that PTA she lived at home with daughter. Pt used rollator at home. Pt was able to complete most ADL's independently. Amanda Porter offered assistance when needed.   CSW reviewed PT/OT recommendations for SNF. Pt reports she is fine with going to SNF.  Pt gave CSW permission to fax out to facilities in the area. Pt has no preference of facility at this time. CSW gave pt medicare.gov rating list to review. CSW explained insurance auth process. Pt reports they are covid vaccinated with one booster.   CSW will continue to follow.   Expected Discharge Plan: Skilled Nursing Facility Barriers to Discharge: Continued Medical Work up, Ship broker   Patient Goals and CMS Choice Patient states their goals for this hospitalization and ongoing recovery are:: to get stronger CMS Medicare.gov Compare Post Acute Care list provided to:: Patient Choice offered to / list presented to : Patient, Adult Children  Expected Discharge Plan and Services Expected Discharge Plan: [place]       Living arrangements for the past 2 months: Single Family Home                                      Prior Living Arrangements/Services Living arrangements for the past 2 months: Single Family Home Lives with:: Adult Children Patient language and need for interpreter reviewed:: Yes        Need for Family Participation in Patient Care: Yes (Comment) Care giver support system in place?: Yes (comment)   Criminal Activity/Legal Involvement Pertinent to Current  Situation/Hospitalization: No - Comment as needed  Activities of Daily Living Home Assistive Devices/Equipment: Walker (specify type), Eyeglasses, Dentures (specify type) ADL Screening (condition at time of admission) Patient's cognitive ability adequate to safely complete daily activities?: Yes Is the patient deaf or have difficulty hearing?: No Does the patient have difficulty seeing, even when wearing glasses/contacts?: No Does the patient have difficulty concentrating, remembering, or making decisions?: Yes Patient able to express need for assistance with ADLs?: Yes Does the patient have difficulty dressing or bathing?: No Independently performs ADLs?: No Communication: Independent Dressing (OT): Independent Grooming: Independent Feeding: Independent Bathing: Independent Toileting: Independent In/Out Bed: Independent Walks in Home: Independent Does the patient have difficulty walking or climbing stairs?: No Weakness of Legs: None Weakness of Arms/Hands: None  Permission Sought/Granted   Permission granted to share information with : Yes, Verbal Permission Granted     Permission granted to share info w AGENCY: SNF        Emotional Assessment Appearance:: Appears stated age Attitude/Demeanor/Rapport: Engaged Affect (typically observed): Appropriate Orientation: : Oriented to Situation, Oriented to Self, Oriented to Place, Oriented to  Time Alcohol / Substance Use: Not Applicable Psych Involvement: No (comment)  Admission diagnosis:  Trauma [T14.90XA] Acute respiratory failure requiring reintubation ([place]) [J96.00] Patient Active Problem List   Diagnosis Date Noted   Protein-calorie malnutrition, severe 11/07/2021   Acute respiratory failure requiring reintubation ([place])  11/05/2021   PCP:  Pleas Koch, NP Pharmacy:  No Pharmacies Listed    Social Determinants of Health (SDOH) Interventions    Readmission Risk Interventions No flowsheet data  found.  Emeterio Reeve, LCSW Clinical Social Worker

## 2021-11-08 NOTE — Care Management Important Message (Signed)
Important Message  Patient Details  Name: Amanda Porter MRN: [id number] Date of Birth: 1945-12-14   Medicare Important Message Given:  Yes     Hannah Beat 11/08/2021, 1:51 PM

## 2021-11-09 LAB — MAGNESIUM: Magnesium: 2.1 mg/dL (ref 1.7–2.4)

## 2021-11-09 LAB — CBC WITH DIFFERENTIAL/PLATELET
Abs Immature Granulocytes: 0.02 10*3/uL (ref 0.00–0.07)
Basophils Absolute: 0.1 10*3/uL (ref 0.0–0.1)
Basophils Relative: 1 %
Eosinophils Absolute: 0.4 10*3/uL (ref 0.0–0.5)
Eosinophils Relative: 6 %
HCT: 29.8 % — ABNORMAL LOW (ref 36.0–46.0)
Hemoglobin: 9.1 g/dL — ABNORMAL LOW (ref 12.0–15.0)
Immature Granulocytes: 0 %
Lymphocytes Relative: 29 %
Lymphs Abs: 2.1 10*3/uL (ref 0.7–4.0)
MCH: 28.2 pg (ref 26.0–34.0)
MCHC: 30.5 g/dL (ref 30.0–36.0)
MCV: 92.3 fL (ref 80.0–100.0)
Monocytes Absolute: 1.2 10*3/uL — ABNORMAL HIGH (ref 0.1–1.0)
Monocytes Relative: 17 %
Neutro Abs: 3.4 10*3/uL (ref 1.7–7.7)
Neutrophils Relative %: 47 %
Platelets: 323 10*3/uL (ref 150–400)
RBC: 3.23 MIL/uL — ABNORMAL LOW (ref 3.87–5.11)
RDW: 12.9 % (ref 11.5–15.5)
WBC: 7.1 10*3/uL (ref 4.0–10.5)
nRBC: 0 % (ref 0.0–0.2)

## 2021-11-09 LAB — BLOOD CULTURE ID PANEL (REFLEXED) - BCID2

## 2021-11-09 LAB — COMPREHENSIVE METABOLIC PANEL
ALT: 16 U/L (ref 0–44)
AST: 17 U/L (ref 15–41)
Albumin: 2.6 g/dL — ABNORMAL LOW (ref 3.5–5.0)
Alkaline Phosphatase: 47 U/L (ref 38–126)
Anion gap: 10 (ref 5–15)
BUN: 13 mg/dL (ref 8–23)
CO2: 30 mmol/L (ref 22–32)
Calcium: 8.8 mg/dL — ABNORMAL LOW (ref 8.9–10.3)
Chloride: 100 mmol/L (ref 98–111)
Creatinine, Ser: 0.59 mg/dL (ref 0.44–1.00)
GFR, Estimated: >60 mL/min (ref >60)
Glucose, Bld: 134 mg/dL — ABNORMAL HIGH (ref 70–99)
Potassium: 3.9 mmol/L (ref 3.5–5.1)
Sodium: 140 mmol/L (ref 135–145)
Total Bilirubin: 0.4 mg/dL (ref 0.3–1.2)
Total Protein: 6.1 g/dL — ABNORMAL LOW (ref 6.5–8.1)

## 2021-11-09 LAB — IRON AND TIBC
Iron: 32 ug/dL (ref 28–170)
Saturation Ratios: 9 % — ABNORMAL LOW (ref 10.4–31.8)
TIBC: 346 ug/dL (ref 250–450)
UIBC: 314 ug/dL

## 2021-11-09 LAB — GLUCOSE, CAPILLARY
Glucose-Capillary: 132 mg/dL — ABNORMAL HIGH (ref 70–99)
Glucose-Capillary: 152 mg/dL — ABNORMAL HIGH (ref 70–99)
Glucose-Capillary: 199 mg/dL — ABNORMAL HIGH (ref 70–99)
Glucose-Capillary: 125 mg/dL — ABNORMAL HIGH (ref 70–99)

## 2021-11-09 LAB — RETICULOCYTES
Immature Retic Fract: 26.5 % — ABNORMAL HIGH (ref 2.3–15.9)
RBC.: 3.31 MIL/uL — ABNORMAL LOW (ref 3.87–5.11)
Retic Count, Absolute: 47.3 10*3/uL (ref 19.0–186.0)
Retic Ct Pct: 1.4 % (ref 0.4–3.1)

## 2021-11-09 LAB — HEMOGLOBIN A1C
Hgb A1c MFr Bld: 7.1 % — ABNORMAL HIGH (ref 4.8–5.6)
Mean Plasma Glucose: 157.07 mg/dL

## 2021-11-09 LAB — VITAMIN B12: Vitamin B-12: 503 pg/mL (ref 180–914)

## 2021-11-09 MED ORDER — FOLIC ACID 1 MG PO TABS
1.0000 mg | ORAL_TABLET | Freq: Every day | ORAL | Status: DC
  Administered 2021-11-10 – 2021-11-12 (×3): 1 mg via ORAL
  Filled 2021-11-09 (×3): qty 1

## 2021-11-09 MED ORDER — FOSFOMYCIN TROMETHAMINE 3 G PO PACK: 3.0000 g | PACK | Freq: Once | ORAL | Status: DC

## 2021-11-09 MED ORDER — SODIUM CHLORIDE 0.9 % IV SOLN
510.0000 mg | Freq: Once | INTRAVENOUS | Status: AC
  Administered 2021-11-09: 510 mg via INTRAVENOUS
  Filled 2021-11-09: qty 17

## 2021-11-09 MED ORDER — VITAMIN B-12 1000 MCG PO TABS
1000.0000 ug | ORAL_TABLET | Freq: Every day | ORAL | Status: DC
  Administered 2021-11-10 – 2021-11-12 (×3): 1000 ug via ORAL
  Filled 2021-11-09 (×3): qty 1

## 2021-11-09 MED ORDER — AMOXICILLIN 500 MG PO CAPS
500.0000 mg | ORAL_CAPSULE | Freq: Two times a day (BID) | ORAL | Status: DC
  Administered 2021-11-09 – 2021-11-10 (×2): 500 mg via ORAL
  Filled 2021-11-09 (×3): qty 1

## 2021-11-09 NOTE — Progress Notes (Signed)
PHARMACY - PHYSICIAN COMMUNICATION CRITICAL VALUE ALERT - BLOOD CULTURE IDENTIFICATION (BCID)  Amanda Porter is an 75 y.o. female who presented to [facility] on 11/04/2021 with a chief complaint of begin minimally responsive  Assessment:  Pt with 1/3 bottles growing staph epi (no resistance). Likely contaminant.  Name of physician (or Provider) Contacted: Dr. Erlinda Hong  Current antibiotics: None  Changes to prescribed antibiotics recommended:  No additional abx needed  No results found for this or any previous visit.  Sherlon Handing, PharmD, BCPS Please see amion for complete clinical pharmacist phone list 11/09/2021  7:04 AM

## 2021-11-09 NOTE — Progress Notes (Signed)
Mobility Specialist Progress Note:   11/09/21 1330  Mobility  Activity Ambulated in hall  Level of Assistance Standby assist, set-up cues, supervision of patient - no hands on  Assistive Device Front wheel walker  Distance Ambulated (ft) 220 ft  Mobility Ambulated with assistance in hallway  Mobility performed by Mobility specialist  Bed Position Chair  $Mobility charge 1 Mobility   Pt asx during ambulation. Slow, steady gait throughout session. Session performed on 3LO2. Pt up n chair with all needs met.   Nelta Numbers Mobility Specialist  Phone [phone]

## 2021-11-09 NOTE — TOC Progression Note (Signed)
Transition of Care [facility]) - Progression Note    Patient Details  Name: Amanda Porter MRN: [id number] Date of Birth: 1946/05/19  Transition of Care [facility]) CM/SW Contact  Emeterio Reeve, [place] Phone Number: 11/09/2021, 11:47 AM  Clinical Narrative:     CSW gave bed offers by phone to pts daughter [name]. [name] chose [facility], it is close to there home. CSW reached out to admissions coordinator. Pt has [facility], that will need to be started before discharge. Pt will need covid test before discharge.   Expected Discharge Plan: [place] Barriers to Discharge: Continued Medical Work up, Ship broker  Expected Discharge Plan and Services Expected Discharge Plan: [place] arrangements for the past 2 months: Single Family Home                                       Social Determinants of Health (SDOH) Interventions    Readmission Risk Interventions No flowsheet data found.  Emeterio Reeve, LCSW Clinical Social Worker

## 2021-11-09 NOTE — Progress Notes (Addendum)
PROGRESS NOTE    Amanda Porter  PJA:[id number] DOB: [birth year] DOA: 11/04/2021 PCP: Pleas Koch, NP    Chief Complaint  Patient presents with   Trauma    Brief Narrative:  This is a 75 year old black female that presented to the emergency room from home.  Patient's family is currently away on vacation in the patient's best friend is checking on her daily.  She did not get a response from a phone call immediately went to the patient's house.  Her best friend found her laying next to the bed.  It was noted that the patient did not have her oxygen on which she is dependent on 24 hours a day.  The patient's friend is not aware of any recent illness.  The patient does have mild to moderate dementia and intermittently takes too many of her medications.  She may have taken up to 3 days extra of her medications.  Initially the patient was minimally responsive at house and able to verbalize very basic answers to questions.  In route to the hospital patient had a witnessed seizure.  She does have a history of VP shunt. The patient was intubated in the emergency room after further seizure activity.  Patient has a separate medical record entered [id number]  Subjective:  No acute interval changes, she denies pain, daughter reports she still has intermittent confusion which is not her baseline Daughter reports patient continue to have intermittent productive cough with yellow green sputum, patient denies chest pain, no sob, there is no wheezing    Patient is calm and pleasant, she is slightly confused but able to self correct, she remembered what she had for breakfast,   Assessment & Plan:   Principal Problem:   Acute respiratory failure requiring reintubation ([place]) Active Problems:   Protein-calorie malnutrition, severe  Tonic colonic seizure/witnessed - intubated for airway protection in the ED -Trauma alerted, work-up was CT head, C-spine and maxillofacial all negative. -She was loaded  with Keppra 1.5 g, and put on propofol and Versed -Seen by neurology, seizure etiology uncertain, but likely due to unintentional overdose on Wellbutrin due to memory deficit -Neurology recommended:Discontinue Wellbutrin,  -Neurology recommended: Continue Keppra 250 mg twice daily for 3 days, then discontinue as seizure were likely provoked in the setting of Wellbutrin use -Seizure precautions  Fevers Tmax 101 on 12/6 4pm She did have  leukocytosis, wbc normalized since 12/9 Blood culture 1 out of 4 with staph epi dermis likely contamination Urine culture/sputum culture in process MRSA screening negative Cxr obtained on 12/7 , left basilar atelectasis, daughter reports patient has been coughing since after extubation, I did not hear coughing during encounter  Incentive spirometer ordered, out of bed, f/u on culture   12/10 pm Addendum urine culture positive for Enterococcus faecalis, will give fosfomycin x1  Acute on chronic hypoxic respiratory failure due to seizure activity -She is now extubated back on 3 L which is her baseline  COPD on home O2 3 L -Not in exacerbation, continue home medication Daughter reports patient has exertional wheezing at baseline, daughter prefers patient get inhalor in am then one time nebs scheduled at 10 am in addition to prn nebs -no wheezing on exam today   Nondisplaced ulnar styloid fracture/left wrist.  Pccm Spoke with ortho who Recommend to order removable wrist splint and follow up with ortho outpatient.  Left shoulder pain, limited range of motion Right elbow pain x ray left shoulder and right elbow no acute findings   Hypokalemia/hypomagnesemia  Replaced and improved  Non-insulin-dependent type 2 diabetes, on glipizide at home, on ssi here Hyperlipidemia; continue statin  Anemia of chronic disease and iron deficiency anemia followed by Dr Alen Blew in the past, per Dr Alen Blew, iron deficiency is related to chronic blood losses with GI work-up  did not reveal any colon sources Hgb 9.6-9.7 RN reports "1 occurrence of small amount black tarry stool" on 12/9, will check FOBT, anemia work up iron deficiency, will give iv iron x1   H/o LULobectomy for lung Ca 2000 Hx DVT/PE/ L femoral embolectomy 1998  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, h/o Dysphagia , currently has no issues with solids/ liquids,  History of CVA with mild left-sided residual weakness /dementia/memory disturbance H/o depression History of tardive dyskinesia Is followed by neurology Dr Jannifer Franklin and psychiatry Dr Casimiro Needle Discontinue Wellbutrin, daughter reports patient takes buspar 45mg  qam and 30mg  qpm, she also takes ativan 0.5 qhs at home , this has been held since admission, daughter agrees to change ativan 0.5mg  qhs Continue statin Hold plavix for now until rule out GI bleed  FTT; daughter agreed to SNF placement  Nutritional Assessment: The patient's BMI is: Body mass index is 19.33 kg/m.Marland Kitchen Seen by dietician.  I agree with the assessment and plan as outlined below:  Nutrition Status: Nutrition Problem: Severe Malnutrition Etiology: chronic illness (COPD, dementia, CVA) Signs/Symptoms: severe fat depletion, severe muscle depletion Interventions: Ensure Enlive (each supplement provides 350kcal and 20 grams of protein), MVI  .      DVT prophylaxis: enoxaparin (LOVENOX) injection 40 mg Start: 11/06/21 1015 SCDs Start: 11/05/21 0036   Code Status:full Family Communication: daughter at bedside daily  Disposition:   Status is: Inpatient  Dispo: The patient is from: home              Anticipated d/c is to: SNF              Anticipated d/c date is: TBD                Consultants:  Critical care neurology  Procedures:  Intubation/extubation EEG  Antimicrobials:   Anti-infectives (From admission, onward)    None           Objective: Vitals:   11/08/21 2021 11/09/21 0433 11/09/21 0655 11/09/21 0803   BP: (!) 142/65 127/67 (!) 122/59   Pulse: 93 87 (!) 102   Resp: 19  17   Temp: 98.7 F (37.1 C) 98.3 F (36.8 C) 98 F (36.7 C)   TempSrc: Oral Oral Oral   SpO2: 99% 100% (!) 85% 98%  Weight:      Height:        Intake/Output Summary (Last 24 hours) at 11/09/2021 1256 Last data filed at 11/08/2021 1807 Gross per 24 hour  Intake 0 ml  Output 300 ml  Net -300 ml   Filed Weights   11/05/21 0500 11/06/21 0447 11/07/21 0500  Weight: 53.7 kg 52.1 kg 49.5 kg    Examination:  General exam: alert, awake, communicative,calm, NAD, she is slightly confused but able to self correct, daughter reports patient is not at her baseline Respiratory system: diminished, but no rales, no rhonchi, ? Mild wheezes at basis ?, Respiratory effort normal. Cardiovascular system:  RRR.  Gastrointestinal system: Abdomen is nondistended, soft and nontender.  Normal bowel sounds heard. Central nervous system: Alert and oriented. No focal neurological deficits. Extremities:  no edema, left wrist in splint Skin: No rashes, lesions or ulcers Psychiatry:  impaired memory, pleasant and cooperative .     Data Reviewed: I have personally reviewed following labs and imaging studies  CBC: Recent Labs  Lab 11/05/21 0325 11/06/21 0237 11/07/21 0422 11/08/21 0508 11/09/21 0150  WBC 10.4 12.9* 12.0* 9.5 7.1  NEUTROABS  --   --   --  6.3 3.4  HGB 7.5* 8.4* 9.6* 9.7* 9.1*  HCT 24.9* 26.9* 31.4* 31.3* 29.8*  MCV 93.6 92.4 92.6 91.8 92.3  PLT 174 249 285 298 578    Basic Metabolic Panel: Recent Labs  Lab 11/05/21 0325 11/06/21 0237 11/06/21 1351 11/07/21 0422 11/08/21 0508 11/09/21 0150  NA 135 137 140 138 141 140  K 3.4* 3.6 3.9 3.7 3.6 3.9  CL 103 103 102 103 100 100  CO2 24 28 30 27 31 30   GLUCOSE 167* 99 95 101* 137* 134*  BUN 11 6* 6* 7* 12 13  CREATININE 0.82 0.59 0.71 0.49 0.60 0.59  CALCIUM 8.0* 7.9* 8.5* 8.3* 8.4* 8.8*  MG 1.9 1.8  --  2.1  --  2.1    GFR: Estimated Creatinine  Clearance: 46.8 mL/min (by C-G formula based on SCr of 0.59 mg/dL).  Liver Function Tests: Recent Labs  Lab 11/04/21 2144 11/09/21 0150  AST 36 17  ALT 23 16  ALKPHOS 62 47  BILITOT 0.2* 0.4  PROT 7.2 6.1*  ALBUMIN 3.6 2.6*    CBG: Recent Labs  Lab 11/08/21 1114 11/08/21 1710 11/08/21 2022 11/09/21 0653 11/09/21 1111  GLUCAP 177* 135* 197* 132* 152*     Recent Results (from the past 240 hour(s))  Resp Panel by RT-PCR (Flu A&B, Covid) Nasopharyngeal Swab     Status: None   Collection Time: 11/04/21 10:05 PM   Specimen: Nasopharyngeal Swab; Nasopharyngeal(NP) swabs in vial transport medium  Result Value Ref Range Status   SARS Coronavirus 2 by RT PCR NEGATIVE NEGATIVE Final    Comment: (NOTE) SARS-CoV-2 target nucleic acids are NOT DETECTED.  The SARS-CoV-2 RNA is generally detectable in upper respiratory specimens during the acute phase of infection. The lowest concentration of SARS-CoV-2 viral copies this assay can detect is 138 copies/mL. A negative result does not preclude SARS-Cov-2 infection and should not be used as the sole basis for treatment or other patient management decisions. A negative result may occur with  improper specimen collection/handling, submission of specimen other than nasopharyngeal swab, presence of viral mutation(s) within the areas targeted by this assay, and inadequate number of viral copies(<138 copies/mL). A negative result must be combined with clinical observations, patient history, and epidemiological information. The expected result is Negative.  Fact Sheet for Patients:  EntrepreneurPulse.com.au  Fact Sheet for Healthcare Providers:  IncredibleEmployment.be  This test is no t yet approved or cleared by the [place] FDA and  has been authorized for detection and/or diagnosis of SARS-CoV-2 by FDA under an Emergency Use Authorization (EUA). This EUA will remain  in effect (meaning this  test can be used) for the duration of the COVID-19 declaration under Section 564(b)(1) of the Act, 21 U.S.C.section 360bbb-3(b)(1), unless the authorization is terminated  or revoked sooner.       Influenza A by PCR NEGATIVE NEGATIVE Final   Influenza B by PCR NEGATIVE NEGATIVE Final    Comment: (NOTE) The Xpert Xpress SARS-CoV-2/FLU/RSV plus assay is intended as an aid in the diagnosis of influenza from Nasopharyngeal swab specimens and should not be used as a sole basis for treatment. Nasal washings and aspirates are unacceptable for Xpert  Xpress SARS-CoV-2/FLU/RSV testing.  Fact Sheet for Patients: EntrepreneurPulse.com.au  Fact Sheet for Healthcare Providers: IncredibleEmployment.be  This test is not yet approved or cleared by the [place] FDA and has been authorized for detection and/or diagnosis of SARS-CoV-2 by FDA under an Emergency Use Authorization (EUA). This EUA will remain in effect (meaning this test can be used) for the duration of the COVID-19 declaration under Section 564(b)(1) of the Act, 21 U.S.C. section 360bbb-3(b)(1), unless the authorization is terminated or revoked.  Performed at [facility], [place] [address], [place], [place] [address]   MRSA Next Gen by PCR, Nasal     Status: None   Collection Time: 11/05/21  2:23 AM   Specimen: Nasal Mucosa; Nasal Swab  Result Value Ref Range Status   MRSA by PCR Next Gen NOT DETECTED NOT DETECTED Final    Comment: (NOTE) The GeneXpert MRSA Assay (FDA approved for NASAL specimens only), is one component of a comprehensive MRSA colonization surveillance program. It is not intended to diagnose MRSA infection nor to guide or monitor treatment for MRSA infections. Test performance is not FDA approved in patients less than 44 years old. Performed at [facility], [place] [address], [place], [place] [address]   Culture, blood (Routine X 2) w Reflex to ID Panel      Status: None (Preliminary result)   Collection Time: 11/08/21  5:08 AM   Specimen: BLOOD  Result Value Ref Range Status   Specimen Description BLOOD SITE NOT SPECIFIED  Final   Special Requests   Final    BOTTLES DRAWN AEROBIC AND ANAEROBIC Blood Culture adequate volume   Culture  Setup Time   Final    GRAM POSITIVE COCCI AEROBIC BOTTLE ONLY CRITICAL RESULT CALLED TO, READ BACK BY AND VERIFIED WITH: C AMEND,PHARMD@0702  11/09/21 [place] Performed at [facility], [place] [address] [place], [place] [address]    Culture GRAM POSITIVE COCCI  Final   Report Status PENDING  Incomplete  Culture, blood (Routine X 2) w Reflex to ID Panel     Status: None (Preliminary result)   Collection Time: 11/08/21  5:08 AM   Specimen: BLOOD  Result Value Ref Range Status   Specimen Description BLOOD SITE NOT SPECIFIED  Final   Special Requests   Final    BOTTLES DRAWN AEROBIC AND ANAEROBIC Blood Culture adequate volume   Culture   Final    NO GROWTH 1 DAY Performed at [facility], [place] [address] [place], [place] [address]    Report Status PENDING  Incomplete  Blood Culture ID Panel (Reflexed)     Status: Abnormal   Collection Time: 11/08/21  5:08 AM  Result Value Ref Range Status   Enterococcus faecalis NOT DETECTED NOT DETECTED Final   Enterococcus Faecium NOT DETECTED NOT DETECTED Final   Listeria monocytogenes NOT DETECTED NOT DETECTED Final   Staphylococcus species DETECTED (A) NOT DETECTED Final    Comment: CRITICAL RESULT CALLED TO, READ BACK BY AND VERIFIED WITH: C AMEND,PHARMD@0700  11/09/21 [place]    Staphylococcus aureus (BCID) NOT DETECTED NOT DETECTED Final   Staphylococcus epidermidis DETECTED (A) NOT DETECTED Final    Comment: CRITICAL RESULT CALLED TO, READ BACK BY AND VERIFIED WITH: C AMEND,PHARMD@0700  11/09/21 [place]    Staphylococcus lugdunensis NOT DETECTED NOT DETECTED Final   Streptococcus species NOT DETECTED NOT DETECTED Final   Streptococcus agalactiae NOT DETECTED NOT  DETECTED Final   Streptococcus pneumoniae NOT DETECTED NOT DETECTED Final  Streptococcus pyogenes NOT DETECTED NOT DETECTED Final   A.calcoaceticus-baumannii NOT DETECTED NOT DETECTED Final   Bacteroides fragilis NOT DETECTED NOT DETECTED Final   Enterobacterales NOT DETECTED NOT DETECTED Final   Enterobacter cloacae complex NOT DETECTED NOT DETECTED Final   Escherichia coli NOT DETECTED NOT DETECTED Final   Klebsiella aerogenes NOT DETECTED NOT DETECTED Final   Klebsiella oxytoca NOT DETECTED NOT DETECTED Final   Klebsiella pneumoniae NOT DETECTED NOT DETECTED Final   Proteus species NOT DETECTED NOT DETECTED Final   Salmonella species NOT DETECTED NOT DETECTED Final   Serratia marcescens NOT DETECTED NOT DETECTED Final   Haemophilus influenzae NOT DETECTED NOT DETECTED Final   Neisseria meningitidis NOT DETECTED NOT DETECTED Final   Pseudomonas aeruginosa NOT DETECTED NOT DETECTED Final   Stenotrophomonas maltophilia NOT DETECTED NOT DETECTED Final   Candida albicans NOT DETECTED NOT DETECTED Final   Candida auris NOT DETECTED NOT DETECTED Final   Candida glabrata NOT DETECTED NOT DETECTED Final   Candida krusei NOT DETECTED NOT DETECTED Final   Candida parapsilosis NOT DETECTED NOT DETECTED Final   Candida tropicalis NOT DETECTED NOT DETECTED Final   Cryptococcus neoformans/gattii NOT DETECTED NOT DETECTED Final   Methicillin resistance mecA/C NOT DETECTED NOT DETECTED Final    Comment: Performed at [facility], [address], [place], [place] [address]  Expectorated Sputum Assessment w Gram Stain, Rflx to Resp Cult     Status: None   Collection Time: 11/08/21  5:39 AM   Specimen: Expectorated Sputum  Result Value Ref Range Status   Specimen Description EXPECTORATED SPUTUM  Final   Special Requests NONE  Final   Sputum evaluation   Final    THIS SPECIMEN IS ACCEPTABLE FOR SPUTUM CULTURE Performed at [facility], [place] [address], [place], [place] [address]     Report Status 11/08/2021 FINAL  Final  Culture, Respiratory w Gram Stain     Status: None (Preliminary result)   Collection Time: 11/08/21  5:39 AM  Result Value Ref Range Status   Specimen Description EXPECTORATED SPUTUM  Final   Special Requests NONE Reflexed from [id number]  Final   Gram Stain   Final    RARE WBC PRESENT,BOTH PMN AND MONONUCLEAR ABUNDANT GRAM POSITIVE COCCI IN PAIRS IN CLUSTERS RARE GRAM NEGATIVE RODS    Culture   Final    CULTURE REINCUBATED FOR BETTER GROWTH Performed at [facility], [place] [address], [place], [place] [address]    Report Status PENDING  Incomplete  Urine Culture     Status: None (Preliminary result)   Collection Time: 11/08/21  3:51 PM   Specimen: Urine, Clean Catch  Result Value Ref Range Status   Specimen Description URINE, CLEAN CATCH  Final   Special Requests NONE  Final   Culture   Final    CULTURE REINCUBATED FOR BETTER GROWTH Performed at [facility], [place] [address], [place], [place] [address]    Report Status PENDING  Incomplete         Radiology Studies: DG Elbow 2 Views Right  Result Date: 11/07/2021 CLINICAL DATA:  Recent trauma, pain EXAM: RIGHT ELBOW - 2 VIEW COMPARISON:  None. FINDINGS: There is no evidence of fracture, dislocation, or joint effusion. There is no evidence of arthropathy or other focal bone abnormality. Soft tissues are unremarkable. IMPRESSION: No radiographic abnormality is seen in the right elbow. Electronically Signed   By: Elmer Picker M.D.   On: 11/07/2021 16:38  DG Shoulder Left  Result Date: 11/07/2021 CLINICAL DATA:  Recent trauma, pain EXAM: LEFT SHOULDER - 2+ VIEW COMPARISON:  None. FINDINGS: No fracture or dislocation is seen. Osteopenia is seen in bony structures. There are surgical clips in the mediastinum. IMPRESSION: No significant radiographic abnormality is seen in the left shoulder. Electronically Signed   By: Elmer Picker M.D.   On: 11/07/2021 16:38         Scheduled Meds:  busPIRone  30 mg Oral QHS   busPIRone  45 mg Oral Daily   enoxaparin (LOVENOX) injection  40 mg Subcutaneous Daily   feeding supplement  237 mL Oral BID BM   fluticasone furoate-vilanterol  1 puff Inhalation Daily   guaiFENesin  600 mg Oral BID   insulin aspart  0-9 Units Subcutaneous TID WC   ipratropium-albuterol  3 mL Nebulization q morning   levETIRAcetam  250 mg Oral BID   mouth rinse  15 mL Mouth Rinse BID   polyethylene glycol  17 g Oral Daily   rosuvastatin  20 mg Oral Daily   senna-docusate  1 tablet Oral BID   umeclidinium bromide  1 puff Inhalation Daily   Continuous Infusions:  sodium chloride       LOS: 4 days   Time spent: 69mins Greater than 50% of this time was spent in counseling, explanation of diagnosis, planning of further management, and coordination of care.   Voice Recognition Viviann Spare dictation system was used to create this note, attempts have been made to correct errors. Please contact the author with questions and/or clarifications.   Florencia Reasons, MD PhD FACP [facility]  Available via Epic secure chat 7am-7pm for nonurgent issues Please page for urgent issues To page the attending provider between 7A-7P or the covering provider during after hours 7P-7A, please log into the web site [email or web address] and access using universal [place] password for that web site. If you do not have the password, please call the hospital operator.    11/09/2021, 12:56 PM

## 2021-11-10 ENCOUNTER — Inpatient Hospital Stay (HOSPITAL_COMMUNITY): Payer: Medicare PPO

## 2021-11-10 LAB — URINE CULTURE: Culture: >=100000 — AB

## 2021-11-10 LAB — BASIC METABOLIC PANEL
Anion gap: 9 (ref 5–15)
BUN: 12 mg/dL (ref 8–23)
CO2: 29 mmol/L (ref 22–32)
Calcium: 9.2 mg/dL (ref 8.9–10.3)
Chloride: 101 mmol/L (ref 98–111)
Creatinine, Ser: 0.6 mg/dL (ref 0.44–1.00)
GFR, Estimated: >60 mL/min (ref >60)
Glucose, Bld: 126 mg/dL — ABNORMAL HIGH (ref 70–99)
Potassium: 3.6 mmol/L (ref 3.5–5.1)
Sodium: 139 mmol/L (ref 135–145)

## 2021-11-10 LAB — CBC
HCT: 29.8 % — ABNORMAL LOW (ref 36.0–46.0)
Hemoglobin: 9.3 g/dL — ABNORMAL LOW (ref 12.0–15.0)
MCH: 28.4 pg (ref 26.0–34.0)
MCHC: 31.2 g/dL (ref 30.0–36.0)
MCV: 91.1 fL (ref 80.0–100.0)
Platelets: 330 10*3/uL (ref 150–400)
RBC: 3.27 MIL/uL — ABNORMAL LOW (ref 3.87–5.11)
RDW: 12.9 % (ref 11.5–15.5)
WBC: 7.9 10*3/uL (ref 4.0–10.5)
nRBC: 0 % (ref 0.0–0.2)

## 2021-11-10 LAB — OCCULT BLOOD X 1 CARD TO LAB, STOOL: Fecal Occult Bld: NEGATIVE

## 2021-11-10 LAB — GLUCOSE, CAPILLARY
Glucose-Capillary: 143 mg/dL — ABNORMAL HIGH (ref 70–99)
Glucose-Capillary: 237 mg/dL — ABNORMAL HIGH (ref 70–99)
Glucose-Capillary: 110 mg/dL — ABNORMAL HIGH (ref 70–99)
Glucose-Capillary: 147 mg/dL — ABNORMAL HIGH (ref 70–99)

## 2021-11-10 LAB — CULTURE, RESPIRATORY W GRAM STAIN: Culture: NORMAL

## 2021-11-10 MED ORDER — CLOPIDOGREL BISULFATE 75 MG PO TABS
75.0000 mg | ORAL_TABLET | Freq: Every day | ORAL | Status: DC
  Administered 2021-11-11 – 2021-11-12 (×2): 75 mg via ORAL
  Filled 2021-11-10 (×2): qty 1

## 2021-11-10 MED ORDER — TAMSULOSIN HCL 0.4 MG PO CAPS
0.4000 mg | ORAL_CAPSULE | Freq: Every day | ORAL | Status: DC
  Administered 2021-11-11 – 2021-11-12 (×2): 0.4 mg via ORAL
  Filled 2021-11-10 (×2): qty 1

## 2021-11-10 MED ORDER — CLOPIDOGREL BISULFATE 75 MG PO TABS: 75.0000 mg | ORAL_TABLET | Freq: Every day | ORAL | Status: DC

## 2021-11-10 MED ORDER — AMOXICILLIN 500 MG PO CAPS
500.0000 mg | ORAL_CAPSULE | Freq: Three times a day (TID) | ORAL | Status: DC
  Administered 2021-11-10 – 2021-11-12 (×5): 500 mg via ORAL
  Filled 2021-11-10 (×7): qty 1

## 2021-11-10 MED ORDER — FERROUS SULFATE 325 (65 FE) MG PO TABS
325.0000 mg | ORAL_TABLET | Freq: Every day | ORAL | Status: DC
  Administered 2021-11-11 – 2021-11-12 (×2): 325 mg via ORAL
  Filled 2021-11-10 (×3): qty 1

## 2021-11-10 NOTE — Progress Notes (Addendum)
PROGRESS NOTE    Amanda Porter  SEG:[id number] DOB: 30-Jul-1946 DOA: 11/04/2021 PCP: Pleas Koch, NP    Chief Complaint  Patient presents with   Trauma    Brief Narrative:  This is a 75 year old black female that presented to the emergency room from home.  Patient's family is currently away on vacation in the patient's best friend is checking on her daily.  She did not get a response from a phone call immediately went to the patient's house.  Her best friend found her laying next to the bed.  It was noted that the patient did not have her oxygen on which she is dependent on 24 hours a day.  The patient's friend is not aware of any recent illness.  The patient does have mild to moderate dementia and intermittently takes too many of her medications.  She may have taken up to 3 days extra of her medications.  Initially the patient was minimally responsive at house and able to verbalize very basic answers to questions.  In route to the hospital patient had a witnessed seizure.  She does have a history of VP shunt. The patient was intubated in the emergency room after further seizure activity.  Patient has a separate medical record entered [id number]  Subjective:  Daughter reports patient has persistent headache, patient reports she had morning headache, daughter requested to repeat head CT ,  Daughter reports patient continue to have intermittent confusion , slightly improved, but not at her baseline   Daughter reports patient continues to have intermittent productive cough , she is worried about ventilator associated pneumonia, patient denies chest pain, no sob, there is no wheezing , sputum culture with normal flora, Patient reports chronic nasal drainage but does not want flonase   Patient is calm and pleasant, she is slightly confused but able to self correct  There is no fever. No leukocytosis  in the last 48hrs,   Assessment & Plan:   Principal Problem:   Acute respiratory  failure requiring reintubation (HCC) Active Problems:   Protein-calorie malnutrition, severe  Tonic colonic seizure/witnessed - intubated for airway protection in the ED -Trauma alerted, work-up was CT head, C-spine and maxillofacial all negative. -She was loaded with Keppra 1.5 g, and put on propofol and Versed -Seen by neurology, seizure etiology uncertain, but likely due to unintentional overdose on Wellbutrin due to memory deficit -Neurology recommended:Discontinue Wellbutrin,  -Neurology recommended: Continue Keppra 250 mg twice daily for 3 days, then discontinue as seizure were likely provoked in the setting of Wellbutrin use -Seizure precautions -daughter is wondering patient's headache is related to her seizures, she wants to repeat Ct head which is ordered   P.m. addendum: CT head no acute findings  Fevers possible from UTI, bronchitis -Tmax 101 on 12/6 4pm while she was in the ICU -She did have  leukocytosis, wbc normalized since 12/9 -Blood culture 1 out of 4 with staph epi dermis likely contamination -Urine culture + Enterococcus faecalis sensitive to ampicillin/nitrofurantoin and vancomycin,  -sputum culture with normal oropharyngeal flora -MRSA screening negative -Cxr obtained on 12/7 , left basilar atelectasis -Started on amoxicillin 3 times a day plan for total 5 days treatment -Incentive spirometer ordered, out of bed   Suspect underline neurogenic bladder in the setting of NPH, bladder scan showed 300cc urine yesterday, but she was able to void after bladder scan, and PVR was 0. Advised patient to do timed voiding to avoid urinary retention to decreased risk of uti, daughter and  patient expressed understanding  Daughter prefers to continue bladder scan qshit Daughter agreed to start Flomax  Acute on chronic hypoxic respiratory failure due to seizure activity -She is now extubated back on 3 L which is her baseline  COPD on home O2 3 L -Not in exacerbation, continue  home medication Daughter reports patient has exertional wheezing at baseline, daughter prefers patient get inhalor in am then one time nebs scheduled at 10 am in addition to prn nebs -no wheezing on exam today   Nondisplaced ulnar styloid fracture/left wrist.  Pccm Spoke with ortho who Recommend to order removable wrist splint and follow up with ortho outpatient.  Left shoulder pain, limited range of motion Right elbow pain x ray left shoulder and right elbow no acute findings   Hypokalemia/hypomagnesemia Replaced and improved  Non-insulin-dependent type 2 diabetes, on glipizide at home, on ssi here Hyperlipidemia; continue statin  Anemia of chronic disease and iron deficiency anemia followed by Dr Alen Blew in the past, per Dr Alen Blew, iron deficiency is related to chronic blood losses with GI work-up did not reveal any colon sources in 2018 Hgb 9.6-9.7-9.1-9.3  FOBT negative ,  anemia work up iron deficiency, s/p iv iron x1 Resume plavix    H/o LULobectomy for lung Ca 2000 Hx DVT/PE/ L femoral embolectomy 1998, not on anticoagulation at home   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, h/o Dysphagia , currently has no issues with solids/ liquids,  History of CVA with mild left-sided residual weakness /dementia/memory disturbance H/o depression History of tardive dyskinesia Is followed by neurology Dr Jannifer Franklin and psychiatry Dr Casimiro Needle Discontinue Wellbutrin,  daughter reports patient takes buspar 45mg  qam and 30mg  qpm which is ordered  Daughter reprots patient  takes ativan 0.5 qhs at home , this has been held since admission, daughter agrees to change ativan to 0.5mg  qhs prn Continue statin Plavix held due to anemia concern of GI bleed, stool guaiac negative, resume Plavix   FTT; daughter agreed to SNF placement  Nutritional Assessment: The patient's BMI is: Body mass index is 19.45 kg/m.Marland Kitchen Seen by dietician.  I agree with the assessment and plan as  outlined below:  Nutrition Status: Nutrition Problem: Severe Malnutrition Etiology: chronic illness (COPD, dementia, CVA) Signs/Symptoms: severe fat depletion, severe muscle depletion Interventions: Ensure Enlive (each supplement provides 350kcal and 20 grams of protein), MVI  .      DVT prophylaxis: enoxaparin (LOVENOX) injection 40 mg Start: 11/06/21 1015 SCDs Start: 11/05/21 0036   Code Status:full Family Communication: daughter at bedside daily  Disposition:   Status is: Inpatient  Dispo: The patient is from: home              Anticipated d/c is to: SNF              Anticipated d/c date is: Medically stable to discharge to skilled nursing facility on Monday, covid screening ordered on 12/11                Consultants:  Critical care neurology  Procedures:  Intubation/extubation EEG  Antimicrobials:   Anti-infectives (From admission, onward)    Start     Dose/Rate Route Frequency Ordered Stop   11/09/21 1745  amoxicillin (AMOXIL) capsule 500 mg        500 mg Oral Every 12 hours 11/09/21 1659     11/09/21 1730  fosfomycin (MONUROL) packet 3 g  Status:  Discontinued        3 g Oral  Once 11/09/21 1632 11/09/21 1659           Objective: Vitals:   11/09/21 1939 11/10/21 0334 11/10/21 0457 11/10/21 0730  BP: (!) 152/61 (!) 146/58  137/61  Pulse: 88 92  91  Resp: 18 18  18   Temp: 98.8 F (37.1 C) 98.6 F (37 C)  98.3 F (36.8 C)  TempSrc: Oral Oral  Oral  SpO2: 100% 99%  100%  Weight:   49.8 kg   Height:        Intake/Output Summary (Last 24 hours) at 11/10/2021 1435 Last data filed at 11/10/2021 0700 Gross per 24 hour  Intake 300 ml  Output --  Net 300 ml   Filed Weights   11/06/21 0447 11/07/21 0500 11/10/21 0457  Weight: 52.1 kg 49.5 kg 49.8 kg    Examination:  General exam: alert, awake, communicative,calm, NAD, she is slightly confused but able to self correct, daughter reports patient is not at her baseline Respiratory system:  diminished, but no rales, no rhonchi, no wheezing, Respiratory effort normal. Cardiovascular system:  RRR.  Gastrointestinal system: Abdomen is nondistended, soft and nontender.  Normal bowel sounds heard. Central nervous system: Alert and oriented. No focal neurological deficits. Extremities:  no edema, left wrist in splint Skin: No rashes, lesions or ulcers Psychiatry: impaired memory, pleasant and cooperative .     Data Reviewed: I have personally reviewed following labs and imaging studies  CBC: Recent Labs  Lab 11/06/21 0237 11/07/21 0422 11/08/21 0508 11/09/21 0150 11/10/21 0125  WBC 12.9* 12.0* 9.5 7.1 7.9  NEUTROABS  --   --  6.3 3.4  --   HGB 8.4* 9.6* 9.7* 9.1* 9.3*  HCT 26.9* 31.4* 31.3* 29.8* 29.8*  MCV 92.4 92.6 91.8 92.3 91.1  PLT 249 285 298 323 696    Basic Metabolic Panel: Recent Labs  Lab 11/05/21 0325 11/06/21 0237 11/06/21 1351 11/07/21 0422 11/08/21 0508 11/09/21 0150 11/10/21 0125  NA 135 137 140 138 141 140 139  K 3.4* 3.6 3.9 3.7 3.6 3.9 3.6  CL 103 103 102 103 100 100 101  CO2 24 28 30 27 31 30 29   GLUCOSE 167* 99 95 101* 137* 134* 126*  BUN 11 6* 6* 7* 12 13 12   CREATININE 0.82 0.59 0.71 0.49 0.60 0.59 0.60  CALCIUM 8.0* 7.9* 8.5* 8.3* 8.4* 8.8* 9.2  MG 1.9 1.8  --  2.1  --  2.1  --     GFR: Estimated Creatinine Clearance: 47 mL/min (by C-G formula based on SCr of 0.6 mg/dL).  Liver Function Tests: Recent Labs  Lab 11/04/21 2144 11/09/21 0150  AST 36 17  ALT 23 16  ALKPHOS 62 47  BILITOT 0.2* 0.4  PROT 7.2 6.1*  ALBUMIN 3.6 2.6*    CBG: Recent Labs  Lab 11/09/21 1111 11/09/21 1558 11/09/21 2154 11/10/21 0758 11/10/21 1312  GLUCAP 152* 199* 125* 143* 237*     Recent Results (from the past 240 hour(s))  Resp Panel by RT-PCR (Flu A&B, Covid) Nasopharyngeal Swab     Status: None   Collection Time: 11/04/21 10:05 PM   Specimen: Nasopharyngeal Swab; Nasopharyngeal(NP) swabs in vial transport medium  Result Value Ref  Range Status   SARS Coronavirus 2 by RT PCR NEGATIVE NEGATIVE Final    Comment: (NOTE) SARS-CoV-2 target nucleic acids are NOT DETECTED.  The SARS-CoV-2 RNA is generally detectable in upper respiratory specimens during the acute phase of infection. The lowest concentration of SARS-CoV-2 viral copies this assay  can detect is 138 copies/mL. A negative result does not preclude SARS-Cov-2 infection and should not be used as the sole basis for treatment or other patient management decisions. A negative result may occur with  improper specimen collection/handling, submission of specimen other than nasopharyngeal swab, presence of viral mutation(s) within the areas targeted by this assay, and inadequate number of viral copies(<138 copies/mL). A negative result must be combined with clinical observations, patient history, and epidemiological information. The expected result is Negative.  Fact Sheet for Patients:  EntrepreneurPulse.com.au  Fact Sheet for Healthcare Providers:  IncredibleEmployment.be  This test is no t yet approved or cleared by the [place] FDA and  has been authorized for detection and/or diagnosis of SARS-CoV-2 by FDA under an Emergency Use Authorization (EUA). This EUA will remain  in effect (meaning this test can be used) for the duration of the COVID-19 declaration under Section 564(b)(1) of the Act, 21 U.S.C.section 360bbb-3(b)(1), unless the authorization is terminated  or revoked sooner.       Influenza A by PCR NEGATIVE NEGATIVE Final   Influenza B by PCR NEGATIVE NEGATIVE Final    Comment: (NOTE) The Xpert Xpress SARS-CoV-2/FLU/RSV plus assay is intended as an aid in the diagnosis of influenza from Nasopharyngeal swab specimens and should not be used as a sole basis for treatment. Nasal washings and aspirates are unacceptable for Xpert Xpress SARS-CoV-2/FLU/RSV testing.  Fact Sheet for  Patients: EntrepreneurPulse.com.au  Fact Sheet for Healthcare Providers: IncredibleEmployment.be  This test is not yet approved or cleared by the [place] FDA and has been authorized for detection and/or diagnosis of SARS-CoV-2 by FDA under an Emergency Use Authorization (EUA). This EUA will remain in effect (meaning this test can be used) for the duration of the COVID-19 declaration under Section 564(b)(1) of the Act, 21 U.S.C. section 360bbb-3(b)(1), unless the authorization is terminated or revoked.  Performed at [facility], [place] [address], [place], [place] [address]   MRSA Next Gen by PCR, Nasal     Status: None   Collection Time: 11/05/21  2:23 AM   Specimen: Nasal Mucosa; Nasal Swab  Result Value Ref Range Status   MRSA by PCR Next Gen NOT DETECTED NOT DETECTED Final    Comment: (NOTE) The GeneXpert MRSA Assay (FDA approved for NASAL specimens only), is one component of a comprehensive MRSA colonization surveillance program. It is not intended to diagnose MRSA infection nor to guide or monitor treatment for MRSA infections. Test performance is not FDA approved in patients less than 55 years old. Performed at [facility], [place] [address] [place],  [address]   Culture, blood (Routine X 2) w Reflex to ID Panel     Status: Abnormal (Preliminary result)   Collection Time: 11/08/21  5:08 AM   Specimen: BLOOD  Result Value Ref Range Status   Specimen Description BLOOD SITE NOT SPECIFIED  Final   Special Requests   Final    BOTTLES DRAWN AEROBIC AND ANAEROBIC Blood Culture adequate volume   Culture  Setup Time   Final    GRAM POSITIVE COCCI AEROBIC BOTTLE ONLY CRITICAL RESULT CALLED TO, READ BACK BY AND VERIFIED WITH: C AMEND,PHARMD@0702  11/09/21 [place]    Culture (A)  Final    STAPHYLOCOCCUS EPIDERMIDIS THE SIGNIFICANCE OF ISOLATING THIS ORGANISM FROM A SINGLE SET OF BLOOD CULTURES WHEN MULTIPLE SETS ARE DRAWN IS  UNCERTAIN. PLEASE NOTIFY THE MICROBIOLOGY DEPARTMENT WITHIN ONE WEEK IF SPECIATION AND SENSITIVITIES ARE REQUIRED. Performed at [facility], [place] [address] [place], [place] [address]    Report Status PENDING  Incomplete  Culture, blood (Routine X 2) w Reflex to ID Panel     Status: None (Preliminary result)   Collection Time: 11/08/21  5:08 AM   Specimen: BLOOD  Result Value Ref Range Status   Specimen Description BLOOD SITE NOT SPECIFIED  Final   Special Requests   Final    BOTTLES DRAWN AEROBIC AND ANAEROBIC Blood Culture adequate volume   Culture   Final    NO GROWTH 2 DAYS Performed at [facility], [address] [place], [place] [address]    Report Status PENDING  Incomplete  Blood Culture ID Panel (Reflexed)     Status: Abnormal   Collection Time: 11/08/21  5:08 AM  Result Value Ref Range Status   Enterococcus faecalis NOT DETECTED NOT DETECTED Final   Enterococcus Faecium NOT DETECTED NOT DETECTED Final   Listeria monocytogenes NOT DETECTED NOT DETECTED Final   Staphylococcus species DETECTED (A) NOT DETECTED Final    Comment: CRITICAL RESULT CALLED TO, READ BACK BY AND VERIFIED WITH: C AMEND,PHARMD@0700  11/09/21 [place]    Staphylococcus aureus (BCID) NOT DETECTED NOT DETECTED Final   Staphylococcus epidermidis DETECTED (A) NOT DETECTED Final    Comment: CRITICAL RESULT CALLED TO, READ BACK BY AND VERIFIED WITH: C AMEND,PHARMD@0700  11/09/21 [place]    Staphylococcus lugdunensis NOT DETECTED NOT DETECTED Final   Streptococcus species NOT DETECTED NOT DETECTED Final   Streptococcus agalactiae NOT DETECTED NOT DETECTED Final   Streptococcus pneumoniae NOT DETECTED NOT DETECTED Final   Streptococcus pyogenes NOT DETECTED NOT DETECTED Final   A.calcoaceticus-baumannii NOT DETECTED NOT DETECTED Final   Bacteroides fragilis NOT DETECTED NOT DETECTED Final   Enterobacterales NOT DETECTED NOT DETECTED Final   Enterobacter cloacae complex NOT DETECTED NOT DETECTED Final    Escherichia coli NOT DETECTED NOT DETECTED Final   Klebsiella aerogenes NOT DETECTED NOT DETECTED Final   Klebsiella oxytoca NOT DETECTED NOT DETECTED Final   Klebsiella pneumoniae NOT DETECTED NOT DETECTED Final   Proteus species NOT DETECTED NOT DETECTED Final   Salmonella species NOT DETECTED NOT DETECTED Final   Serratia marcescens NOT DETECTED NOT DETECTED Final   Haemophilus influenzae NOT DETECTED NOT DETECTED Final   Neisseria meningitidis NOT DETECTED NOT DETECTED Final   Pseudomonas aeruginosa NOT DETECTED NOT DETECTED Final   Stenotrophomonas maltophilia NOT DETECTED NOT DETECTED Final   Candida albicans NOT DETECTED NOT DETECTED Final   Candida auris NOT DETECTED NOT DETECTED Final   Candida glabrata NOT DETECTED NOT DETECTED Final   Candida krusei NOT DETECTED NOT DETECTED Final   Candida parapsilosis NOT DETECTED NOT DETECTED Final   Candida tropicalis NOT DETECTED NOT DETECTED Final   Cryptococcus neoformans/gattii NOT DETECTED NOT DETECTED Final   Methicillin resistance mecA/C NOT DETECTED NOT DETECTED Final    Comment: Performed at [facility], [address] [place], [place] [address]  Expectorated Sputum Assessment w Gram Stain, Rflx to Resp Cult     Status: None   Collection Time: 11/08/21  5:39 AM   Specimen: Expectorated Sputum  Result Value Ref Range Status   Specimen Description EXPECTORATED SPUTUM  Final   Special Requests NONE  Final   Sputum evaluation   Final    THIS SPECIMEN IS ACCEPTABLE FOR SPUTUM CULTURE Performed at [facility], [place] [address] [place],  [address]    Report Status 11/08/2021 FINAL  Final  Culture, Respiratory w  Gram Stain     Status: None   Collection Time: 11/08/21  5:39 AM  Result Value Ref Range Status   Specimen Description EXPECTORATED SPUTUM  Final   Special Requests NONE Reflexed from [id number]  Final   Gram Stain   Final    RARE WBC PRESENT,BOTH PMN AND MONONUCLEAR ABUNDANT GRAM POSITIVE COCCI IN PAIRS  IN CLUSTERS RARE GRAM NEGATIVE RODS    Culture   Final    ABUNDANT Normal respiratory flora-no Staph aureus or Pseudomonas seen Performed at [facility], [address], [place], [place] [address]    Report Status 11/10/2021 FINAL  Final  Urine Culture     Status: Abnormal   Collection Time: 11/08/21  3:51 PM   Specimen: Urine, Clean Catch  Result Value Ref Range Status   Specimen Description URINE, CLEAN CATCH  Final   Special Requests   Final    NONE Performed at [facility], [place] [address], [place], [place] [address]    Culture >=100,000 COLONIES/mL ENTEROCOCCUS FAECALIS (A)  Final   Report Status 11/10/2021 FINAL  Final   Organism ID, Bacteria ENTEROCOCCUS FAECALIS (A)  Final      Susceptibility   Enterococcus faecalis - MIC*    AMPICILLIN <=2 SENSITIVE Sensitive     NITROFURANTOIN <=16 SENSITIVE Sensitive     VANCOMYCIN 1 SENSITIVE Sensitive     * >=100,000 COLONIES/mL ENTEROCOCCUS FAECALIS         Radiology Studies: No results found.      Scheduled Meds:  amoxicillin  500 mg Oral Q12H   busPIRone  30 mg Oral QHS   busPIRone  45 mg Oral Daily   enoxaparin (LOVENOX) injection  40 mg Subcutaneous Daily   feeding supplement  237 mL Oral BID BM   fluticasone furoate-vilanterol  1 puff Inhalation Daily   folic acid  1 mg Oral Daily   guaiFENesin  600 mg Oral BID   insulin aspart  0-9 Units Subcutaneous TID WC   ipratropium-albuterol  3 mL Nebulization q morning   levETIRAcetam  250 mg Oral BID   mouth rinse  15 mL Mouth Rinse BID   polyethylene glycol  17 g Oral Daily   rosuvastatin  20 mg Oral Daily   senna-docusate  1 tablet Oral BID   umeclidinium bromide  1 puff Inhalation Daily   vitamin B-12  1,000 mcg Oral Daily   Continuous Infusions:  sodium chloride       LOS: 5 days   Time spent: 5mins Greater than 50% of this time was spent in counseling, explanation of diagnosis, planning of further management, and coordination of  care.   Voice Recognition Viviann Spare dictation system was used to create this note, attempts have been made to correct errors. Please contact the author with questions and/or clarifications.   Florencia Reasons, MD PhD FACP [facility]  Available via Epic secure chat 7am-7pm for nonurgent issues Please page for urgent issues To page the attending provider between 7A-7P or the covering provider during after hours 7P-7A, please log into the web site [email or web address] and access using universal [place] password for that web site. If you do not have the password, please call the hospital operator.    11/10/2021, 2:35 PM

## 2021-11-10 NOTE — Progress Notes (Signed)
Mobility Specialist Progress Note:   11/10/21 1200  Mobility  Activity Ambulated in hall  Level of Assistance Standby assist, set-up cues, supervision of patient - no hands on  Assistive Device Front wheel walker  Distance Ambulated (ft) 300 ft  Mobility Ambulated independently in hallway;Out of bed for toileting  Mobility Response Tolerated well  Mobility performed by Mobility specialist  $Mobility charge 1 Mobility   Session preformed on 3LO2. Pt to BR prior to mobility, BM successful. Pt displayed SOB during ambulation, quickly recovered with pursed lip breathing. Assisted pt with ordering lunch. Back in bed with bed alarm on.    Nelta Numbers Mobility Specialist  Phone [phone]

## 2021-11-11 ENCOUNTER — Ambulatory Visit: Payer: Medicare PPO | Admitting: Internal Medicine

## 2021-11-11 DIAGNOSIS — J9600 Acute respiratory failure, unspecified whether with hypoxia or hypercapnia: Secondary | ICD-10-CM

## 2021-11-11 LAB — CBC
HCT: 30.2 % — ABNORMAL LOW (ref 36.0–46.0)
Hemoglobin: 9.4 g/dL — ABNORMAL LOW (ref 12.0–15.0)
MCH: 28.2 pg (ref 26.0–34.0)
MCHC: 31.1 g/dL (ref 30.0–36.0)
MCV: 90.7 fL (ref 80.0–100.0)
Platelets: 366 10*3/uL (ref 150–400)
RBC: 3.33 MIL/uL — ABNORMAL LOW (ref 3.87–5.11)
RDW: 13 % (ref 11.5–15.5)
WBC: 9.7 10*3/uL (ref 4.0–10.5)
nRBC: 0 % (ref 0.0–0.2)

## 2021-11-11 LAB — GLUCOSE, CAPILLARY
Glucose-Capillary: 133 mg/dL — ABNORMAL HIGH (ref 70–99)
Glucose-Capillary: 140 mg/dL — ABNORMAL HIGH (ref 70–99)
Glucose-Capillary: 145 mg/dL — ABNORMAL HIGH (ref 70–99)
Glucose-Capillary: 154 mg/dL — ABNORMAL HIGH (ref 70–99)
Glucose-Capillary: 179 mg/dL — ABNORMAL HIGH (ref 70–99)
Glucose-Capillary: 191 mg/dL — ABNORMAL HIGH (ref 70–99)
Glucose-Capillary: 207 mg/dL — ABNORMAL HIGH (ref 70–99)

## 2021-11-11 LAB — CULTURE, BLOOD (ROUTINE X 2): Special Requests: ADEQUATE

## 2021-11-11 LAB — BASIC METABOLIC PANEL
Anion gap: 7 (ref 5–15)
BUN: 15 mg/dL (ref 8–23)
CO2: 33 mmol/L — ABNORMAL HIGH (ref 22–32)
Calcium: 9.1 mg/dL (ref 8.9–10.3)
Chloride: 100 mmol/L (ref 98–111)
Creatinine, Ser: 0.64 mg/dL (ref 0.44–1.00)
GFR, Estimated: >60 mL/min (ref >60)
Glucose, Bld: 140 mg/dL — ABNORMAL HIGH (ref 70–99)
Potassium: 3.7 mmol/L (ref 3.5–5.1)
Sodium: 140 mmol/L (ref 135–145)

## 2021-11-11 LAB — SARS CORONAVIRUS 2 (TAT 6-24 HRS): SARS Coronavirus 2: NEGATIVE

## 2021-11-11 MED ORDER — TAMSULOSIN HCL 0.4 MG PO CAPS: 0.4000 mg | ORAL_CAPSULE | Freq: Every day | ORAL | Status: DC

## 2021-11-11 MED ORDER — AMOXICILLIN 500 MG PO CAPS: 500.0000 mg | ORAL_CAPSULE | Freq: Three times a day (TID) | ORAL | 0 refills | Status: AC

## 2021-11-11 MED ORDER — FOLIC ACID 1 MG PO TABS: 1.0000 mg | ORAL_TABLET | Freq: Every day | ORAL | Status: DC

## 2021-11-11 MED ORDER — SENNOSIDES-DOCUSATE SODIUM 8.6-50 MG PO TABS: 1.0000 | ORAL_TABLET | Freq: Two times a day (BID) | ORAL | Status: DC

## 2021-11-11 MED ORDER — FLUTICASONE FUROATE-VILANTEROL 200-25 MCG/ACT IN AEPB: 1.0000 | INHALATION_SPRAY | Freq: Every day | RESPIRATORY_TRACT | Status: DC

## 2021-11-11 MED ORDER — GUAIFENESIN ER 600 MG PO TB12: 600.0000 mg | ORAL_TABLET | Freq: Two times a day (BID) | ORAL | Status: DC

## 2021-11-11 MED ORDER — LORAZEPAM 0.5 MG PO TABS: 0.5000 mg | ORAL_TABLET | Freq: Two times a day (BID) | ORAL | 0 refills | Status: DC | PRN

## 2021-11-11 MED ORDER — BUSPIRONE HCL 15 MG PO TABS: 45.0000 mg | ORAL_TABLET | Freq: Every day | ORAL | Status: DC

## 2021-11-11 MED ORDER — BUSPIRONE HCL 30 MG PO TABS: 30.0000 mg | ORAL_TABLET | Freq: Every day | ORAL | Status: DC

## 2021-11-11 MED ORDER — POLYETHYLENE GLYCOL 3350 17 G PO PACK: 17.0000 g | PACK | Freq: Every day | ORAL | 0 refills | Status: DC | PRN

## 2021-11-11 MED ORDER — CYANOCOBALAMIN 1000 MCG PO TABS: 1000.0000 ug | ORAL_TABLET | Freq: Every day | ORAL | Status: DC

## 2021-11-11 MED ORDER — UMECLIDINIUM BROMIDE 62.5 MCG/ACT IN AEPB: 1.0000 | INHALATION_SPRAY | Freq: Every day | RESPIRATORY_TRACT | Status: DC

## 2021-11-11 NOTE — Progress Notes (Signed)
Physical Therapy Treatment Patient Details Name: Amanda Porter MRN: [id number] DOB: 11/14/1946 Today's Date: 11/11/2021   History of Present Illness 75 yo admitted 12/5 after found down at home without O2 on. Pt with seizure enroute and intubated. Extubated 12/6. 12/7 EEG with encephalopathy. Pt with left ulnar fx with splint. PMhx: hydrocephaly with VP shunt, dementia, HTN, anxiety, COPD on home 3L, DM    PT Comments    Continuing work on functional mobility and activity tolerance;  Session focused on progressive amb with Rollator RW, paying particular attention to safety with managing moving parts of rollator and O2 connections/cords; Min assist to manage these, and pt required cues to self-monitor for activity tolerance, as she had DOE; She is pleasant, and participates well; seems focused on pleasing staff, almost to the point of getting ahead of herself; Pt with decreased balance, cognition, and function who lives alone and does not have 24hr support. Pt has been driving and caring for herself with reports of medication mismanagement and fall. Pt not safe to return home by herself and needs long term ALF. Pt appropriate for SNF based on current function and lack of support as well as cognitive and balance deficits making her a high fall risk and at high risk for continued lack of ability to manage medicine. Noting pt's daughter is actively involved in finding a more sustainable and safe long-term living situation for her mother.  Recommendations for follow up therapy are one component of a multi-disciplinary discharge planning process, led by the attending physician.  Recommendations may be updated based on patient status, additional functional criteria and insurance authorization.  Follow Up Recommendations  Skilled nursing-short term rehab (<3 hours/day)     Assistance Recommended at Discharge Frequent or constant Supervision/Assistance  Equipment Recommendations  BSC/3in1     Recommendations for Other Services       Precautions / Restrictions Precautions Precautions: Fall;Other (comment)     Mobility  Bed Mobility Overal bed mobility: Needs Assistance Bed Mobility: Supine to Sit     Supine to sit: Min assist     General bed mobility comments: Min handheld assist to pull to sit    Transfers Overall transfer level: Needs assistance Equipment used: Rollator (4 wheels) Transfers: Sit to/from Stand Sit to Stand: Min assist;Min guard           General transfer comment: Seeks external support. Min guard for subsequent sit<>stand. Noting difficulty managing O2 tubing while practiciing turning and sitting to Rollator RW, including turning such that she sat on cord x2    Ambulation/Gait Ambulation/Gait assistance: Min assist Gait Distance (Feet): 150 Feet Assistive device: Rollator (4 wheels) Gait Pattern/deviations: Step-through pattern;Decreased stride length;Drifts right/left       General Gait Details: Cues for pathfinding and min assist for cord management; cues aslo to self-monitor for activity tolerance   Stairs             Wheelchair Mobility    Modified Rankin (Stroke Patients Only)       Balance     Sitting balance-Leahy Scale: Good       Standing balance-Leahy Scale: Poor (approaching fair)                              Cognition Arousal/Alertness: Awake/alert Behavior During Therapy: Flat affect;WFL for tasks assessed/performed Overall Cognitive Status: Impaired/Different from baseline Area of Impairment: Orientation;Memory;Following commands;Awareness;Problem solving;Attention;Safety/judgement  Current Attention Level: Sustained Memory: Decreased short-term memory;Decreased recall of precautions Following Commands: Follows one step commands consistently Safety/Judgement: Decreased awareness of safety;Decreased awareness of deficits Awareness: Intellectual Problem  Solving: Difficulty sequencing;Requires verbal cues;Requires tactile cues General Comments: Noting difficulty managing O2 tubing while practiciing turning and sitting to Rollator RW, including turning such that she sat on cord x2; Daughter reports pt would at times get tangled in O2 cords at home, and then move her rollator impulsively, "fighting" teh cords -- leading to incr anxiety, impulsivity , and fatigue        Exercises      General Comments General comments (skin integrity, edema, etc.): Walked on 3 liters supplemental O2 and sats remained greater than or equal to 92%; HR max 134 bpm      Pertinent Vitals/Pain Pain Assessment: No/denies pain Pain Intervention(s): Monitored during session    Home Living                          Prior Function            PT Goals (current goals can now be found in the care plan section) Acute Rehab PT Goals Patient Stated Goal: return home PT Goal Formulation: With patient Time For Goal Achievement: 11/21/21 Potential to Achieve Goals: Fair Progress towards PT goals: Progressing toward goals    Frequency    Min 3X/week      PT Plan Current plan remains appropriate    Co-evaluation              AM-PAC PT "6 Clicks" Mobility   Outcome Measure  Help needed turning from your back to your side while in a flat bed without using bedrails?: A Little Help needed moving from lying on your back to sitting on the side of a flat bed without using bedrails?: A Little Help needed moving to and from a bed to a chair (including a wheelchair)?: A Little Help needed standing up from a chair using your arms (e.g., wheelchair or bedside chair)?: A Little Help needed to walk in hospital room?: A Little Help needed climbing 3-5 steps with a railing? : A Lot 6 Click Score: 17    End of Session Equipment Utilized During Treatment: Oxygen Activity Tolerance: Patient tolerated treatment well Patient left: in chair;with call  bell/phone within reach;with chair alarm set Nurse Communication: Mobility status PT Visit Diagnosis: Other abnormalities of gait and mobility (R26.89);Difficulty in walking, not elsewhere classified (R26.2)     Time: 9528-4132 PT Time Calculation (min) (ACUTE ONLY): 31 min  Charges:  $Gait Training: 23-37 mins                     Roney Marion, [place] Pager [phone] Office [phone]    [place] 11/11/2021, 11:51 AM

## 2021-11-11 NOTE — Progress Notes (Signed)
Mobility Specialist Progress Note:   11/11/21 1000  Mobility  Activity Ambulated in hall  Level of Assistance Standby assist, set-up cues, supervision of patient - no hands on  Assistive Device Front wheel walker  Distance Ambulated (ft) 290 ft  Mobility Ambulated independently in hallway  Mobility Response Tolerated well  Mobility performed by Mobility specialist  Bed Position Chair  $Mobility charge 1 Mobility   Pt with minor SOB during ambulation on 2LO2. Quickly recovered with a standing rest break. Pt otherwise asx, sitting up in chair with all needs met.   Nelta Numbers Mobility Specialist  Phone [phone]

## 2021-11-11 NOTE — Discharge Summary (Addendum)
Physician Discharge Summary  Amanda Porter MGQ:[id number] DOB: 1946/11/09 DOA: 11/04/2021  PCP: Pleas Koch, NP  Admit date: 11/04/2021 Discharge date: 11/12/2021  Admitted From: home Disposition:  SNF  Recommendations for Outpatient Follow-up:  Follow up with PCP in 1-2 weeks Please obtain BMP/CBC in one week  Home Health:NO  Equipment/Devices: NONE  Discharge Condition: Stable Code Status:   Code Status: Full Code Diet recommendation:  Diet Order             Diet Carb Modified Fluid consistency: Thin; Room service appropriate? Yes  Diet effective now                    Brief/Interim Summary:  75 year old black female that presented to the emergency room from home.  Patient's familywas away on vacation in the patient's best friend was checking on her daily.  She did not get a response from a phone call immediately went to the patient's house.  Her best friend found her laying next to the bed.  It was noted that the patient did not have her oxygen on which she is dependent on 24 hours a day.  The patient's friend is not aware of any recent illness.  The patient does have mild to moderate dementia and intermittently takes too many of her medications.  She may have taken up to 3 days extra of her medications.  Initially the patient was minimally responsive at house and able to verbalize very basic answers to questions.  In route to the hospital patient had a witnessed seizure.  She does have a history of VP shunt.The patient was intubated in the emergency room after further seizure activity Patient was treated for GTCS needed intubation for airway protection, seen by neurology Wellbutrin discontinued seizure likely provoked in the setting of Wellbutrin [facility] kept on seizure precaution, patient successfully extubated, treated with Keppra, she had a fever on 12/6 while in the ICU with leukocytosis blood culture with staph epidermidis urine culture positive with Enterococcus faecalis  likely the cause of fever, afebrile since 12/9 and being managed amoxicillin x5 days total. Other issues include suspect underlying neurogenic bladder, acute on chronic hypoxic aspiratory failure on 3 to nasal cannula at baseline, nondisplaced ulnar styloid fracture left wrist which needs outpatient follow-up, electrolyte imbalance and well managed, anemia of chronic disease. Patient has been weak and deconditioned and skilled nursing facility has been advised.  She was waiting for insurance authorization, did not get on 12/12 It has been approved 12/13 and being discharged to SNF today in stable condition.  Discharge Diagnoses:   GTCS needed intubation for airway protection-seen by neurology Wellbutrin discontinued seizure likely provoked in the setting of Wellbutrin ,kept on seizure precaution, treated with Keppra.  Repeat CT head negative.  Outpatient neurology follow-up seizure precaution continue home meds  Fever in ICU with leukocytosis blood culture with staph epidermidis -suspect contamination,  UTI with Enterococcus faecalis likely the cause of fever: afebrile since 12/9 and being managed amoxicillin x5 days total.  Acute on chronic hypoxic respiratory failure on 3 to nasal cannula at baseline COPD on home oxygen: Needing intubation and admission for airway protection.  Weaned off to home setting.  Continue home bronchodilators.  Suspected underlying neurogenic bladder in the setting of NPH, PVR 0 encourage timed voiding to avoid urinary retention, manipulation managed with bladder scan to sit while here started on Flomax  Anemia of chronic disease Iron-deficiency anemia: per Dr Alen Blew, iron deficiency is related to chronic blood  losses with GI work-up did not reveal any colon sources in 2018.Hgb 9.6-9.7-9.1-9.3.FOBT negative Recent Labs  Lab 11/07/21 0422 11/08/21 0508 11/09/21 0150 11/10/21 0125 11/11/21 0116  HGB 9.6* 9.7* 9.1* 9.3* 9.4*  HCT 31.4* 31.3* 29.8* 29.8* 30.2*    H/o LULobectomy for lung Ca 2000 Hx DVT/PE/ L femoral embolectomy 1998, not on anticoagulation at home   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 Juleen China and Dr. Casimiro Needle.  Continue home BuSpar, Ativan  Severe protein calorie moderation in the setting of chronic illness augment diet as below Nutrition Problem: Severe Malnutrition Etiology: chronic illness (COPD, dementia, CVA) Signs/Symptoms: severe fat depletion, severe muscle depletion Interventions: Ensure Enlive (each supplement provides 350kcal and 20 grams of protein), MVI   Nondisplaced ulnar styloid fracture/left wrist: To be followed up with orthopedic as outpatient Left shoulder pain with limited range of motion and right elbow pain-x-ray unremarkable  Deconditioning/debility: PT OT to be continued and waiting for placement  Hypokalemia/hypomagnesemia Replaced and improved   Non-insulin-dependent type 2 diabetes, on glipizide at home, on ssi here Hyperlipidemia; continue statin Recent Labs  Lab 11/11/21 1704 11/11/21 2158 11/11/21 2353 11/12/21 0437 11/12/21 0816  GLUCAP 207* 140* 145* 156* 147*     Consults: PCCM  Subjective: Alert oriented resting comfortably no complaints. Feels ready for dsicharge No new complaints overnight  Discharge Exam: Vitals:   11/12/21 0814 11/12/21 0815  BP:  (!) 107/47  Pulse: 96 93  Resp: 16   Temp: 98.1 F (36.7 C)   SpO2: 99% 100%   General: Pt is alert, awake, not in acute distress Cardiovascular: RRR, S1/S2 +, no rubs, no gallops Respiratory: CTA bilaterally, no wheezing, no rhonchi Abdominal: Soft, NT, ND, bowel sounds + Extremities: no edema, no cyanosis  Discharge Instructions  Discharge Instructions     Discharge instructions    Complete by: As directed    Check CBC and BMP and chest x-ray in 1 week.  Please call call MD or return to ER for similar or worsening recurring problem that brought you to hospital or if any fever,nausea/vomiting,abdominal pain, uncontrolled pain, chest pain,  shortness of breath or any other alarming symptoms.  Please follow-up your doctor as instructed in a week time and call the office for appointment.  Please avoid alcohol, smoking, or any other illicit substance and maintain healthy habits including taking your regular medications as prescribed.  You were cared for by a hospitalist during your hospital stay. If you have any questions about your discharge medications or the care you received while you were in the hospital after you are discharged, you can call the unit and ask to speak with the hospitalist on call if the hospitalist that took care of you is not available.  Once you are discharged, your primary care physician will handle any further medical issues. Please note that NO REFILLS for any discharge medications will be authorized once you are discharged, as it is imperative that you return to your primary care physician (or establish a relationship with a primary care physician if you do not have one) for your aftercare needs so that they can reassess your need for medications and monitor your lab values   Increase activity slowly   Complete by: As directed       Allergies as of 11/12/2021  Reactions   Lexapro [escitalopram]    Neosporin [bacitracin-polymyxin B] Rash        Medication List     STOP taking these medications    buPROPion 150 MG 24 hr tablet Commonly known as: WELLBUTRIN XL   Stiolto Respimat 2.5-2.5 MCG/ACT Aers Generic drug: Tiotropium Bromide-Olodaterol       TAKE these medications    albuterol 108 (90 Base) MCG/ACT inhaler Commonly known as: VENTOLIN HFA 2 puffs every 4 (four) hours as needed for wheezing or shortness of breath.    amoxicillin 500 MG capsule Commonly known as: AMOXIL Take 1 capsule (500 mg total) by mouth every 8 (eight) hours for 4 days.   ASHWAGANDHA PO Take 1 capsule by mouth daily as needed (anxiety).   busPIRone 15 MG tablet Commonly known as: BUSPAR Take 3 tablets (45 mg total) by mouth daily. What changed:  how much to take when to take this   busPIRone 30 MG tablet Commonly known as: BUSPAR Take 1 tablet (30 mg total) by mouth at bedtime. What changed: You were already taking a medication with the same name, and this prescription was added. Make sure you understand how and when to take each.   CALCIUM PO Take 1 capsule by mouth daily.   clopidogrel 75 MG tablet Commonly known as: PLAVIX Take 75 mg by mouth daily.   cyanocobalamin 1000 MCG tablet Take 1 tablet (1,000 mcg total) by mouth daily.   fluticasone furoate-vilanterol 200-25 MCG/ACT Aepb Commonly known as: BREO ELLIPTA Inhale 1 puff into the lungs daily.   folic acid 1 MG tablet Commonly known as: FOLVITE Take 1 tablet (1 mg total) by mouth daily.   glipiZIDE 2.5 MG 24 hr tablet Commonly known as: GLUCOTROL XL Take 2.5 mg by mouth every morning.   guaiFENesin 600 MG 12 hr tablet Commonly known as: MUCINEX Take 1 tablet (600 mg total) by mouth 2 (two) times daily.   IRON PO Take 1 capsule by mouth daily.   LORazepam 0.5 MG tablet Commonly known as: ATIVAN Take 1 tablet (0.5 mg total) by mouth 3 times/day as needed-between meals & bedtime for up to 4 doses for anxiety, seizure or sleep. What changed:  when to take this reasons to take this   polyethylene glycol 17 g packet Commonly known as: MIRALAX / GLYCOLAX Take 17 g by mouth daily as needed for moderate constipation.   rosuvastatin 20 MG tablet Commonly known as: CRESTOR Take 20 mg by mouth daily.   senna-docusate 8.6-50 MG tablet Commonly known as: Senokot-S Take 1 tablet by mouth 2 (two) times daily.   tamsulosin 0.4 MG Caps  capsule Commonly known as: FLOMAX Take 1 capsule (0.4 mg total) by mouth daily after breakfast.   umeclidinium bromide 62.5 MCG/ACT Aepb Commonly known as: INCRUSE ELLIPTA Inhale 1 puff into the lungs daily.        Contact information for follow-up providers     Marlou Sa, Tonna Corner, MD Follow up in 2 week(s).   Specialty: Orthopedic Surgery Why: Nondisplaced ulnar styloid fracture/left wrist. Contact information: [place] [address] [phone]         Wyatt Portela, MD Follow up.   Specialty: Oncology Why: for iron deficiency anemia Contact information: [place] [address] [phone]         Pleas Koch, NP Follow up.   Specialty: Internal Medicine Contact information: [address] [place] [address] [phone]  f/u with GI for iron deficiency anemia Follow up.               Contact information for after-discharge care     Destination     [facility] .   Service: Skilled Nursing Contact information: [address] [place] [address] [phone]                    Allergies  Allergen Reactions   Lexapro [Escitalopram]    Neosporin [Bacitracin-Polymyxin B] Rash    The results of significant diagnostics from this hospitalization (including imaging, microbiology, ancillary and laboratory) are listed below for reference.    Microbiology: Recent Results (from the past 240 hour(s))  Resp Panel by RT-PCR (Flu A&B, Covid) Nasopharyngeal Swab     Status: None   Collection Time: 11/04/21 10:05 PM   Specimen: Nasopharyngeal Swab; Nasopharyngeal(NP) swabs in vial transport medium  Result Value Ref Range Status   SARS Coronavirus 2 by RT PCR NEGATIVE NEGATIVE Final    Comment: (NOTE) SARS-CoV-2 target nucleic acids are NOT DETECTED.  The SARS-CoV-2 RNA is generally detectable in upper respiratory specimens during the acute  phase of infection. The lowest concentration of SARS-CoV-2 viral copies this assay can detect is 138 copies/mL. A negative result does not preclude SARS-Cov-2 infection and should not be used as the sole basis for treatment or other patient management decisions. A negative result may occur with  improper specimen collection/handling, submission of specimen other than nasopharyngeal swab, presence of viral mutation(s) within the areas targeted by this assay, and inadequate number of viral copies(<138 copies/mL). A negative result must be combined with clinical observations, patient history, and epidemiological information. The expected result is Negative.  Fact Sheet for Patients:  EntrepreneurPulse.com.au  Fact Sheet for Healthcare Providers:  IncredibleEmployment.be  This test is no t yet approved or cleared by the [place] FDA and  has been authorized for detection and/or diagnosis of SARS-CoV-2 by FDA under an Emergency Use Authorization (EUA). This EUA will remain  in effect (meaning this test can be used) for the duration of the COVID-19 declaration under Section 564(b)(1) of the Act, 21 U.S.C.section 360bbb-3(b)(1), unless the authorization is terminated  or revoked sooner.       Influenza A by PCR NEGATIVE NEGATIVE Final   Influenza B by PCR NEGATIVE NEGATIVE Final    Comment: (NOTE) The Xpert Xpress SARS-CoV-2/FLU/RSV plus assay is intended as an aid in the diagnosis of influenza from Nasopharyngeal swab specimens and should not be used as a sole basis for treatment. Nasal washings and aspirates are unacceptable for Xpert Xpress SARS-CoV-2/FLU/RSV testing.  Fact Sheet for Patients: EntrepreneurPulse.com.au  Fact Sheet for Healthcare Providers: IncredibleEmployment.be  This test is not yet approved or cleared by the [place] FDA and has been authorized for detection and/or diagnosis of  SARS-CoV-2 by FDA under an Emergency Use Authorization (EUA). This EUA will remain in effect (meaning this test can be used) for the duration of the COVID-19 declaration under Section 564(b)(1) of the Act, 21 U.S.C. section 360bbb-3(b)(1), unless the authorization is terminated or revoked.  Performed at [facility], [place] [address], [place], [place] [address]   MRSA Next Gen by PCR, Nasal     Status: None   Collection Time: 11/05/21  2:23 AM   Specimen: Nasal Mucosa; Nasal Swab  Result Value Ref Range Status   MRSA by PCR Next Gen NOT DETECTED NOT DETECTED Final  Comment: (NOTE) The GeneXpert MRSA Assay (FDA approved for NASAL specimens only), is one component of a comprehensive MRSA colonization surveillance program. It is not intended to diagnose MRSA infection nor to guide or monitor treatment for MRSA infections. Test performance is not FDA approved in patients less than 72 years old. Performed at [facility], [place] [address], [place], [place] [address]   Culture, blood (Routine X 2) w Reflex to ID Panel     Status: Abnormal   Collection Time: 11/08/21  5:08 AM   Specimen: BLOOD  Result Value Ref Range Status   Specimen Description BLOOD SITE NOT SPECIFIED  Final   Special Requests   Final    BOTTLES DRAWN AEROBIC AND ANAEROBIC Blood Culture adequate volume   Culture  Setup Time   Final    GRAM POSITIVE COCCI AEROBIC BOTTLE ONLY CRITICAL RESULT CALLED TO, READ BACK BY AND VERIFIED WITH: C AMEND,PHARMD@0702  11/09/21 [place]    Culture (A)  Final    STAPHYLOCOCCUS EPIDERMIDIS THE SIGNIFICANCE OF ISOLATING THIS ORGANISM FROM A SINGLE SET OF BLOOD CULTURES WHEN MULTIPLE SETS ARE DRAWN IS UNCERTAIN. PLEASE NOTIFY THE MICROBIOLOGY DEPARTMENT WITHIN ONE WEEK IF SPECIATION AND SENSITIVITIES ARE REQUIRED. Performed at [facility], [place] [address] [place], [place] [address]    Report Status 11/11/2021 FINAL  Final  Culture, blood (Routine X 2) w Reflex to ID Panel      Status: None (Preliminary result)   Collection Time: 11/08/21  5:08 AM   Specimen: BLOOD  Result Value Ref Range Status   Specimen Description BLOOD SITE NOT SPECIFIED  Final   Special Requests   Final    BOTTLES DRAWN AEROBIC AND ANAEROBIC Blood Culture adequate volume   Culture   Final    NO GROWTH 4 DAYS Performed at [facility], [place] [address] [place],  [address]    Report Status PENDING  Incomplete  Blood Culture ID Panel (Reflexed)     Status: Abnormal   Collection Time: 11/08/21  5:08 AM  Result Value Ref Range Status   Enterococcus faecalis NOT DETECTED NOT DETECTED Final   Enterococcus Faecium NOT DETECTED NOT DETECTED Final   Listeria monocytogenes NOT DETECTED NOT DETECTED Final   Staphylococcus species DETECTED (A) NOT DETECTED Final    Comment: CRITICAL RESULT CALLED TO, READ BACK BY AND VERIFIED WITH: C AMEND,PHARMD@0700  11/09/21 [place]    Staphylococcus aureus (BCID) NOT DETECTED NOT DETECTED Final   Staphylococcus epidermidis DETECTED (A) NOT DETECTED Final    Comment: CRITICAL RESULT CALLED TO, READ BACK BY AND VERIFIED WITH: C AMEND,PHARMD@0700  11/09/21 [place]    Staphylococcus lugdunensis NOT DETECTED NOT DETECTED Final   Streptococcus species NOT DETECTED NOT DETECTED Final   Streptococcus agalactiae NOT DETECTED NOT DETECTED Final   Streptococcus pneumoniae NOT DETECTED NOT DETECTED Final   Streptococcus pyogenes NOT DETECTED NOT DETECTED Final   A.calcoaceticus-baumannii NOT DETECTED NOT DETECTED Final   Bacteroides fragilis NOT DETECTED NOT DETECTED Final   Enterobacterales NOT DETECTED NOT DETECTED Final   Enterobacter cloacae complex NOT DETECTED NOT DETECTED Final   Escherichia coli NOT DETECTED NOT DETECTED Final   Klebsiella aerogenes NOT DETECTED NOT DETECTED Final   Klebsiella oxytoca NOT DETECTED NOT DETECTED Final   Klebsiella pneumoniae NOT DETECTED NOT DETECTED Final   Proteus species NOT DETECTED NOT DETECTED Final   Salmonella  species NOT DETECTED NOT DETECTED Final   Serratia marcescens NOT DETECTED NOT DETECTED Final   Haemophilus influenzae NOT DETECTED  NOT DETECTED Final   Neisseria meningitidis NOT DETECTED NOT DETECTED Final   Pseudomonas aeruginosa NOT DETECTED NOT DETECTED Final   Stenotrophomonas maltophilia NOT DETECTED NOT DETECTED Final   Candida albicans NOT DETECTED NOT DETECTED Final   Candida auris NOT DETECTED NOT DETECTED Final   Candida glabrata NOT DETECTED NOT DETECTED Final   Candida krusei NOT DETECTED NOT DETECTED Final   Candida parapsilosis NOT DETECTED NOT DETECTED Final   Candida tropicalis NOT DETECTED NOT DETECTED Final   Cryptococcus neoformans/gattii NOT DETECTED NOT DETECTED Final   Methicillin resistance mecA/C NOT DETECTED NOT DETECTED Final    Comment: Performed at [facility], [place] [address], [place], [place] [address]  Expectorated Sputum Assessment w Gram Stain, Rflx to Resp Cult     Status: None   Collection Time: 11/08/21  5:39 AM   Specimen: Expectorated Sputum  Result Value Ref Range Status   Specimen Description EXPECTORATED SPUTUM  Final   Special Requests NONE  Final   Sputum evaluation   Final    THIS SPECIMEN IS ACCEPTABLE FOR SPUTUM CULTURE Performed at [facility], [place] [address] [place], [place] [address]    Report Status 11/08/2021 FINAL  Final  Culture, Respiratory w Gram Stain     Status: None   Collection Time: 11/08/21  5:39 AM  Result Value Ref Range Status   Specimen Description EXPECTORATED SPUTUM  Final   Special Requests NONE Reflexed from [id number]  Final   Gram Stain   Final    RARE WBC PRESENT,BOTH PMN AND MONONUCLEAR ABUNDANT GRAM POSITIVE COCCI IN PAIRS IN CLUSTERS RARE GRAM NEGATIVE RODS    Culture   Final    ABUNDANT Normal respiratory flora-no Staph aureus or Pseudomonas seen Performed at [facility], [place] [address], [place], [place] [address]    Report Status 11/10/2021 FINAL  Final  Urine Culture     Status:  Abnormal   Collection Time: 11/08/21  3:51 PM   Specimen: Urine, Clean Catch  Result Value Ref Range Status   Specimen Description URINE, CLEAN CATCH  Final   Special Requests   Final    NONE Performed at [facility], [place] [address], [place], [place] [address]    Culture >=100,000 COLONIES/mL ENTEROCOCCUS FAECALIS (A)  Final   Report Status 11/10/2021 FINAL  Final   Organism ID, Bacteria ENTEROCOCCUS FAECALIS (A)  Final      Susceptibility   Enterococcus faecalis - MIC*    AMPICILLIN <=2 SENSITIVE Sensitive     NITROFURANTOIN <=16 SENSITIVE Sensitive     VANCOMYCIN 1 SENSITIVE Sensitive     * >=100,000 COLONIES/mL ENTEROCOCCUS FAECALIS  SARS CORONAVIRUS 2 (TAT 6-24 HRS) Nasopharyngeal Nasopharyngeal Swab     Status: None   Collection Time: 11/10/21  2:26 PM   Specimen: Nasopharyngeal Swab  Result Value Ref Range Status   SARS Coronavirus 2 NEGATIVE NEGATIVE Final    Comment: (NOTE) SARS-CoV-2 target nucleic acids are NOT DETECTED.  The SARS-CoV-2 RNA is generally detectable in upper and lower respiratory specimens during the acute phase of infection. Negative results do not preclude SARS-CoV-2 infection, do not rule out co-infections with other pathogens, and should not be used as the sole basis for treatment or other patient management decisions. Negative results must be combined with clinical observations, patient history, and epidemiological information. The expected result is Negative.  Fact Sheet for Patients: SugarRoll.be  Fact Sheet for Healthcare Providers: https://www.woods-mathews.com/  This test is not  yet approved or cleared by the [place] and  has been authorized for detection and/or diagnosis of SARS-CoV-2 by FDA under an Emergency Use Authorization (EUA). This EUA will remain  in effect (meaning this test can be used) for the duration of the COVID-19 declaration under Se ction 564(b)(1) of the Act, 21  U.S.C. section 360bbb-3(b)(1), unless the authorization is terminated or revoked sooner.  Performed at [facility], [place] [address], [place], [place] [address]     Procedures/Studies: DG Elbow 2 Views Right  Result Date: 11/07/2021 CLINICAL DATA:  Recent trauma, pain EXAM: RIGHT ELBOW - 2 VIEW COMPARISON:  None. FINDINGS: There is no evidence of fracture, dislocation, or joint effusion. There is no evidence of arthropathy or other focal bone abnormality. Soft tissues are unremarkable. IMPRESSION: No radiographic abnormality is seen in the right elbow. Electronically Signed   By: Elmer Picker M.D.   On: 11/07/2021 16:38   DG Wrist Complete Left  Result Date: 11/04/2021 CLINICAL DATA:  Fall, seizures, left ulnar styloid bruising. EXAM: LEFT WRIST - COMPLETE 3+ VIEW COMPARISON:  None. FINDINGS: There is diffusely decreased mineralization of the bones. A cortical regularity is seen at the ulnar styloid suggesting nondisplaced fracture. The remaining bony structures are intact and there is no dislocation. Mild degenerative changes are present at the first carpometacarpal joint. Soft tissue swelling is present about the wrist. IMPRESSION: Nondisplaced ulnar styloid fracture. Electronically Signed   By: Brett Fairy M.D.   On: 11/04/2021 22:50   CT HEAD WO CONTRAST (5MM)  Result Date: 11/10/2021 CLINICAL DATA:  Mental status change. Confusion and possible seizure EXAM: CT HEAD WITHOUT CONTRAST TECHNIQUE: Contiguous axial images were obtained from the base of the skull through the vertex without intravenous contrast. COMPARISON:  CT head 11/04/2021 FINDINGS: Brain: Generalized atrophy. Right frontal ventricular shunt catheter in the right frontal horn unchanged. Mild diffuse ventricular enlargement is unchanged. Moderate white matter hypodensity diffusely. Chronic infarct right thalamus. Negative for acute infarct, hemorrhage, mass Vascular: Negative for hyperdense vessel Skull: Right  frontal burr hole for shunt catheter. No acute skeletal abnormality Sinuses/Orbits: Paranasal sinuses clear.  Negative orbit Other: None IMPRESSION: No acute abnormality no interval change Atrophy and chronic ischemic change Right frontal shunt catheter.  Mild ventricular enlargement, stable. Electronically Signed   By: Franchot Gallo M.D.   On: 11/10/2021 16:17   CT HEAD WO CONTRAST  Result Date: 11/04/2021 CLINICAL DATA:  Head trauma, mod-severe; Neck trauma (Age >= 65y); Facial trauma. Seizure, fall, respiratory failure EXAM: CT HEAD WITHOUT CONTRAST CT MAXILLOFACIAL WITHOUT CONTRAST CT CERVICAL SPINE WITHOUT CONTRAST TECHNIQUE: Multidetector CT imaging of the head, cervical spine, and maxillofacial structures were performed using the standard protocol without intravenous contrast. Multiplanar CT image reconstructions of the cervical spine and maxillofacial structures were also generated. COMPARISON:  CT head 07/06/2021 FINDINGS: CT HEAD FINDINGS Brain: Right frontal ventricular shunt catheter abutting the septum pellucidum is unchanged. Extensive periventricular white matter changes are present, stable since prior examination, nonspecific. This may reflect the sequela of small vessel ischemia or demyelinating disease. Mild ventriculomegaly is unchanged possibly representing the sequela of central atrophy. Remote lacunar infarct noted within the right thalamus. No evidence of acute intracranial hemorrhage or infarct. No abnormal mass effect or midline shift. No abnormal intra or extra-axial mass lesion. Cerebellum is unremarkable. Vascular: No asymmetric hyperdense vasculature at the skull base. Skull: No acute fracture Other: Mastoid air cells and middle ear cavities are clear. CT MAXILLOFACIAL FINDINGS Osseous: No fracture  or mandibular dislocation. No destructive process. Orbits: Negative. No traumatic or inflammatory finding. Sinuses: Clear. Soft tissues: Facial soft tissues are unremarkable. Endotracheal  tube and nasogastric tube are visualized. CT CERVICAL SPINE FINDINGS Alignment: Normal. Skull base and vertebrae: Craniocervical alignment is normal. There is prominent enthesophyte formation at the level of the apical ligament at the craniocervical junction. Atlantal dental interval is not widened. There is no acute fracture of the cervical spine. Soft tissues and spinal canal: The spinal canal is widely patent. No canal hematoma. No prevertebral soft tissue swelling or fluid collections identified. No pathologic adenopathy within the cervical soft tissues. Visualized thyroid is unremarkable. Disc levels: There is intervertebral disc space narrowing at C5-C7 with associated endplate remodeling in keeping with changes of moderate degenerative disc disease. The prevertebral soft tissues are not thickened on sagittal reformats. Review of the axial images demonstrates mild to moderate bilateral neuroforaminal narrowing at C5-6 and C6-7 secondary to uncovertebral arthrosis mild bilateral neuroforaminal narrowing also noted at C7-T1. Upper chest: Emphysema noted. Other: None IMPRESSION: No acute intracranial injury.  No calvarial fracture. No acute facial fracture or mandibular dislocation. No acute fracture or listhesis of the cervical spine. Emphysema (ICD10-J43.9). These results were called by telephone at the time of interpretation on 11/04/2021 at 10:42 pm to provider Redmond Pulling, MD, who verbally acknowledged these results. Electronically Signed   By: Fidela Salisbury M.D.   On: 11/04/2021 22:45   CT CERVICAL SPINE WO CONTRAST  Result Date: 11/04/2021 CLINICAL DATA:  Head trauma, mod-severe; Neck trauma (Age >= 65y); Facial trauma. Seizure, fall, respiratory failure EXAM: CT HEAD WITHOUT CONTRAST CT MAXILLOFACIAL WITHOUT CONTRAST CT CERVICAL SPINE WITHOUT CONTRAST TECHNIQUE: Multidetector CT imaging of the head, cervical spine, and maxillofacial structures were performed using the standard protocol without intravenous  contrast. Multiplanar CT image reconstructions of the cervical spine and maxillofacial structures were also generated. COMPARISON:  CT head 07/06/2021 FINDINGS: CT HEAD FINDINGS Brain: Right frontal ventricular shunt catheter abutting the septum pellucidum is unchanged. Extensive periventricular white matter changes are present, stable since prior examination, nonspecific. This may reflect the sequela of small vessel ischemia or demyelinating disease. Mild ventriculomegaly is unchanged possibly representing the sequela of central atrophy. Remote lacunar infarct noted within the right thalamus. No evidence of acute intracranial hemorrhage or infarct. No abnormal mass effect or midline shift. No abnormal intra or extra-axial mass lesion. Cerebellum is unremarkable. Vascular: No asymmetric hyperdense vasculature at the skull base. Skull: No acute fracture Other: Mastoid air cells and middle ear cavities are clear. CT MAXILLOFACIAL FINDINGS Osseous: No fracture or mandibular dislocation. No destructive process. Orbits: Negative. No traumatic or inflammatory finding. Sinuses: Clear. Soft tissues: Facial soft tissues are unremarkable. Endotracheal tube and nasogastric tube are visualized. CT CERVICAL SPINE FINDINGS Alignment: Normal. Skull base and vertebrae: Craniocervical alignment is normal. There is prominent enthesophyte formation at the level of the apical ligament at the craniocervical junction. Atlantal dental interval is not widened. There is no acute fracture of the cervical spine. Soft tissues and spinal canal: The spinal canal is widely patent. No canal hematoma. No prevertebral soft tissue swelling or fluid collections identified. No pathologic adenopathy within the cervical soft tissues. Visualized thyroid is unremarkable. Disc levels: There is intervertebral disc space narrowing at C5-C7 with associated endplate remodeling in keeping with changes of moderate degenerative disc disease. The prevertebral soft  tissues are not thickened on sagittal reformats. Review of the axial images demonstrates mild to moderate bilateral neuroforaminal narrowing at C5-6 and C6-7 secondary to  uncovertebral arthrosis mild bilateral neuroforaminal narrowing also noted at C7-T1. Upper chest: Emphysema noted. Other: None IMPRESSION: No acute intracranial injury.  No calvarial fracture. No acute facial fracture or mandibular dislocation. No acute fracture or listhesis of the cervical spine. Emphysema (ICD10-J43.9). These results were called by telephone at the time of interpretation on 11/04/2021 at 10:42 pm to provider Redmond Pulling, MD, who verbally acknowledged these results. Electronically Signed   By: Fidela Salisbury M.D.   On: 11/04/2021 22:45   DG Pelvis Portable  Result Date: 11/04/2021 CLINICAL DATA:  Fall. EXAM: PORTABLE PELVIS 1-2 VIEWS COMPARISON:  None. FINDINGS: No acute fracture or dislocation. Mild lower lumbar degenerative changes. A catheter is partially visualized over the pelvis. The soft tissues are unremarkable. IMPRESSION: No acute findings. Electronically Signed   By: Anner Crete M.D.   On: 11/04/2021 22:00   DG Chest Port 1 View  Result Date: 11/06/2021 CLINICAL DATA:  Acute respiratory failure EXAM: PORTABLE CHEST 1 VIEW COMPARISON:  11/04/2021 FINDINGS: Endotracheal and enteric tubes are no longer present. Left perihilar operative changes. Stable mild interstitial prominence. Left basilar atelectasis/scarring. No pleural effusion or pneumothorax. IMPRESSION: Left basilar atelectasis/scarring. Electronically Signed   By: Macy Mis M.D.   On: 11/06/2021 08:32   DG Chest Port 1 View  Addendum Date: 11/04/2021   ADDENDUM REPORT: 11/04/2021 22:20 ADDENDUM: These results were called by telephone at the time of interpretation on 11/04/2021 at 10:07 pm to provider [facility] , who verbally acknowledged these results. Electronically Signed   By: Ronney Asters M.D.   On: 11/04/2021 22:20   Result Date:  11/04/2021 CLINICAL DATA:  Trauma, fall. EXAM: PORTABLE CHEST 1 VIEW COMPARISON:  Chest x-ray 06/12/2021. FINDINGS: There is right mainstem intubation 7 mm. Enteric tube extends below the diaphragm. Right-sided central venous catheter tip projects over the mid SVC. The cardiomediastinal silhouette appears stable. Left suprahilar density and surgical clips are again noted. Cardiac silhouette within normal limits. There is no pleural effusion or pneumothorax identified. There is some strandy opacities in the left lung base. No acute fractures are seen. IMPRESSION: 1. Right mainstem intubation.  Recommend retraction 4 cm. 2. Enteric tube extends below the diaphragm. 3. Minimal left basilar atelectasis/airspace disease. 4. Stable chronic and postsurgical changes in the left suprahilar region. Electronically Signed: By: Ronney Asters M.D. On: 11/04/2021 22:02   DG Shoulder Left  Result Date: 11/07/2021 CLINICAL DATA:  Recent trauma, pain EXAM: LEFT SHOULDER - 2+ VIEW COMPARISON:  None. FINDINGS: No fracture or dislocation is seen. Osteopenia is seen in bony structures. There are surgical clips in the mediastinum. IMPRESSION: No significant radiographic abnormality is seen in the left shoulder. Electronically Signed   By: Elmer Picker M.D.   On: 11/07/2021 16:38   EEG adult  Result Date: 11/05/2021 Lora Havens, MD     11/05/2021  8:42 AM Patient Name: JESSIECA RHEM MRN: [id number] Epilepsy Attending: Lora Havens Referring Physician/Provider: Dr Rosalin Hawking Date: 11/04/2021 Duration: 34.47 mins Patient history: 75 year old female presented with generalized tonic-clonic seizure-like episode.  EEG to evaluate for seizure. Level of alertness: lethargic AEDs during EEG study: LEV Technical aspects: This EEG study was done with scalp electrodes positioned according to the 10-20 International system of electrode placement. Electrical activity was acquired at a sampling rate of 500Hz  and reviewed with a high  frequency filter of 70Hz  and a low frequency filter of 1Hz . EEG data were recorded continuously and digitally stored. Description: No clear posterior dominant rhythm was  seen.  EEG showed continuous generalized 3 to 6 Hz theta-delta slowing.  Hyperventilation and photic stimulation were not performed.   ABNORMALITY - Continuous slow, generalized IMPRESSION: This study is suggestive of moderate to severe diffuse encephalopathy, nonspecific etiology. No seizures or definite epileptiform discharges were seen throughout the recording. Priyanka Barbra Sarks   Overnight EEG with video  Result Date: 11/05/2021 Lora Havens, MD     11/06/2021  9:14 AM Patient Name: KIMBERLY NIELAND MRN: [id number] Epilepsy Attending: Lora Havens Referring Physician/Provider: Dr Rosalin Hawking Duration: 11/05/2021 0221 to 11/06/2021 0221  Patient history: 75 year old female presented with generalized tonic-clonic seizure-like episode.  EEG to evaluate for seizure.  Level of alertness: lethargic  AEDs during EEG study: LEV, Propofol, versed  Technical aspects: This EEG study was done with scalp electrodes positioned according to the 10-20 International system of electrode placement. Electrical activity was acquired at a sampling rate of 500Hz  and reviewed with a high frequency filter of 70Hz  and a low frequency filter of 1Hz . EEG data were recorded continuously and digitally stored.  Description: No clear posterior dominant rhythm was seen. Sleep was characterized by sleep spindles (12 to 14 Hz), maximum frontocentral region.  EEG showed continuous generalized 3 to 6 Hz theta-delta slowing admixed with 15 to 18 Hz generalized beta activity intermittently. Hyperventilation and photic stimulation were not performed.   Part of the study was technically difficult due to significant electrode artifact.  ABNORMALITY - Continuous slow, generalized  IMPRESSION: This study is suggestive of moderate to severe diffuse encephalopathy, nonspecific etiology.  No seizures or definite epileptiform discharges were seen throughout the recording.  Priyanka Barbra Sarks   CT MAXILLOFACIAL WO CONTRAST  Result Date: 11/04/2021 CLINICAL DATA:  Head trauma, mod-severe; Neck trauma (Age >= 65y); Facial trauma. Seizure, fall, respiratory failure EXAM: CT HEAD WITHOUT CONTRAST CT MAXILLOFACIAL WITHOUT CONTRAST CT CERVICAL SPINE WITHOUT CONTRAST TECHNIQUE: Multidetector CT imaging of the head, cervical spine, and maxillofacial structures were performed using the standard protocol without intravenous contrast. Multiplanar CT image reconstructions of the cervical spine and maxillofacial structures were also generated. COMPARISON:  CT head 07/06/2021 FINDINGS: CT HEAD FINDINGS Brain: Right frontal ventricular shunt catheter abutting the septum pellucidum is unchanged. Extensive periventricular white matter changes are present, stable since prior examination, nonspecific. This may reflect the sequela of small vessel ischemia or demyelinating disease. Mild ventriculomegaly is unchanged possibly representing the sequela of central atrophy. Remote lacunar infarct noted within the right thalamus. No evidence of acute intracranial hemorrhage or infarct. No abnormal mass effect or midline shift. No abnormal intra or extra-axial mass lesion. Cerebellum is unremarkable. Vascular: No asymmetric hyperdense vasculature at the skull base. Skull: No acute fracture Other: Mastoid air cells and middle ear cavities are clear. CT MAXILLOFACIAL FINDINGS Osseous: No fracture or mandibular dislocation. No destructive process. Orbits: Negative. No traumatic or inflammatory finding. Sinuses: Clear. Soft tissues: Facial soft tissues are unremarkable. Endotracheal tube and nasogastric tube are visualized. CT CERVICAL SPINE FINDINGS Alignment: Normal. Skull base and vertebrae: Craniocervical alignment is normal. There is prominent enthesophyte formation at the level of the apical ligament at the craniocervical  junction. Atlantal dental interval is not widened. There is no acute fracture of the cervical spine. Soft tissues and spinal canal: The spinal canal is widely patent. No canal hematoma. No prevertebral soft tissue swelling or fluid collections identified. No pathologic adenopathy within the cervical soft tissues. Visualized thyroid is unremarkable. Disc levels: There is intervertebral disc space narrowing at C5-C7 with associated  endplate remodeling in keeping with changes of moderate degenerative disc disease. The prevertebral soft tissues are not thickened on sagittal reformats. Review of the axial images demonstrates mild to moderate bilateral neuroforaminal narrowing at C5-6 and C6-7 secondary to uncovertebral arthrosis mild bilateral neuroforaminal narrowing also noted at C7-T1. Upper chest: Emphysema noted. Other: None IMPRESSION: No acute intracranial injury.  No calvarial fracture. No acute facial fracture or mandibular dislocation. No acute fracture or listhesis of the cervical spine. Emphysema (ICD10-J43.9). These results were called by telephone at the time of interpretation on 11/04/2021 at 10:42 pm to provider Redmond Pulling, MD, who verbally acknowledged these results. Electronically Signed   By: Fidela Salisbury M.D.   On: 11/04/2021 22:45    Labs: BNP (last 3 results) No results for input(s): BNP in the last 8760 hours. Basic Metabolic Panel: Recent Labs  Lab 11/06/21 0237 11/06/21 1351 11/07/21 0422 11/08/21 0508 11/09/21 0150 11/10/21 0125 11/11/21 0116  NA 137   < > 138 141 140 139 140  K 3.6   < > 3.7 3.6 3.9 3.6 3.7  CL 103   < > 103 100 100 101 100  CO2 28   < > 27 31 30 29  33*  GLUCOSE 99   < > 101* 137* 134* 126* 140*  BUN 6*   < > 7* 12 13 12 15   CREATININE 0.59   < > 0.49 0.60 0.59 0.60 0.64  CALCIUM 7.9*   < > 8.3* 8.4* 8.8* 9.2 9.1  MG 1.8  --  2.1  --  2.1  --   --    < > = values in this interval not displayed.   Liver Function Tests: Recent Labs  Lab 11/09/21 0150   AST 17  ALT 16  ALKPHOS 47  BILITOT 0.4  PROT 6.1*  ALBUMIN 2.6*   No results for input(s): LIPASE, AMYLASE in the last 168 hours. No results for input(s): AMMONIA in the last 168 hours. CBC: Recent Labs  Lab 11/07/21 0422 11/08/21 0508 11/09/21 0150 11/10/21 0125 11/11/21 0116  WBC 12.0* 9.5 7.1 7.9 9.7  NEUTROABS  --  6.3 3.4  --   --   HGB 9.6* 9.7* 9.1* 9.3* 9.4*  HCT 31.4* 31.3* 29.8* 29.8* 30.2*  MCV 92.6 91.8 92.3 91.1 90.7  PLT 285 298 323 330 366   Cardiac Enzymes: No results for input(s): CKTOTAL, CKMB, CKMBINDEX, TROPONINI in the last 168 hours. BNP: Invalid input(s): POCBNP CBG: Recent Labs  Lab 11/11/21 1704 11/11/21 2158 11/11/21 2353 11/12/21 0437 11/12/21 0816  GLUCAP 207* 140* 145* 156* 147*   D-Dimer No results for input(s): DDIMER in the last 72 hours. Hgb A1c No results for input(s): HGBA1C in the last 72 hours.  Lipid Profile No results for input(s): CHOL, HDL, LDLCALC, TRIG, CHOLHDL, LDLDIRECT in the last 72 hours. Thyroid function studies No results for input(s): TSH, T4TOTAL, T3FREE, THYROIDAB in the last 72 hours.  Invalid input(s): FREET3  Anemia work up No results for input(s): VITAMINB12, FOLATE, FERRITIN, TIBC, IRON, RETICCTPCT in the last 72 hours.  Urinalysis    Component Value Date/Time   COLORURINE YELLOW 11/08/2021 1547   APPEARANCEUR HAZY (A) 11/08/2021 1547   LABSPEC 1.016 11/08/2021 1547   PHURINE 7.0 11/08/2021 1547   GLUCOSEU 50 (A) 11/08/2021 1547   HGBUR NEGATIVE 11/08/2021 1547   BILIRUBINUR NEGATIVE 11/08/2021 1547   KETONESUR NEGATIVE 11/08/2021 1547   PROTEINUR NEGATIVE 11/08/2021 1547   NITRITE NEGATIVE 11/08/2021 1547   LEUKOCYTESUR NEGATIVE 11/08/2021  [place] input(s): PROCALCITONIN,  WBC,  LACTICIDVEN Microbiology Recent Results (from the past 240 hour(s))  Resp Panel by RT-PCR (Flu A&B, Covid) Nasopharyngeal Swab     Status: None   Collection Time: 11/04/21 10:05 PM    Specimen: Nasopharyngeal Swab; Nasopharyngeal(NP) swabs in vial transport medium  Result Value Ref Range Status   SARS Coronavirus 2 by RT PCR NEGATIVE NEGATIVE Final    Comment: (NOTE) SARS-CoV-2 target nucleic acids are NOT DETECTED.  The SARS-CoV-2 RNA is generally detectable in upper respiratory specimens during the acute phase of infection. The lowest concentration of SARS-CoV-2 viral copies this assay can detect is 138 copies/mL. A negative result does not preclude SARS-Cov-2 infection and should not be used as the sole basis for treatment or other patient management decisions. A negative result may occur with  improper specimen collection/handling, submission of specimen other than nasopharyngeal swab, presence of viral mutation(s) within the areas targeted by this assay, and inadequate number of viral copies(<138 copies/mL). A negative result must be combined with clinical observations, patient history, and epidemiological information. The expected result is Negative.  Fact Sheet for Patients:  EntrepreneurPulse.com.au  Fact Sheet for Healthcare Providers:  IncredibleEmployment.be  This test is no t yet approved or cleared by the [place] FDA and  has been authorized for detection and/or diagnosis of SARS-CoV-2 by FDA under an Emergency Use Authorization (EUA). This EUA will remain  in effect (meaning this test can be used) for the duration of the COVID-19 declaration under Section 564(b)(1) of the Act, 21 U.S.C.section 360bbb-3(b)(1), unless the authorization is terminated  or revoked sooner.       Influenza A by PCR NEGATIVE NEGATIVE Final   Influenza B by PCR NEGATIVE NEGATIVE Final    Comment: (NOTE) The Xpert Xpress SARS-CoV-2/FLU/RSV plus assay is intended as an aid in the diagnosis of influenza from Nasopharyngeal swab specimens and should not be used as a sole basis for treatment. Nasal washings and aspirates are  unacceptable for Xpert Xpress SARS-CoV-2/FLU/RSV testing.  Fact Sheet for Patients: EntrepreneurPulse.com.au  Fact Sheet for Healthcare Providers: IncredibleEmployment.be  This test is not yet approved or cleared by the [place] FDA and has been authorized for detection and/or diagnosis of SARS-CoV-2 by FDA under an Emergency Use Authorization (EUA). This EUA will remain in effect (meaning this test can be used) for the duration of the COVID-19 declaration under Section 564(b)(1) of the Act, 21 U.S.C. section 360bbb-3(b)(1), unless the authorization is terminated or revoked.  Performed at [facility], [place] [address], [place], [place] [address]   MRSA Next Gen by PCR, Nasal     Status: None   Collection Time: 11/05/21  2:23 AM   Specimen: Nasal Mucosa; Nasal Swab  Result Value Ref Range Status   MRSA by PCR Next Gen NOT DETECTED NOT DETECTED Final    Comment: (NOTE) The GeneXpert MRSA Assay (FDA approved for NASAL specimens only), is one component of a comprehensive MRSA colonization surveillance program. It is not intended to diagnose MRSA infection nor to guide or monitor treatment for MRSA infections. Test performance is not FDA approved in patients less than 27 years old. Performed at [facility], [place] [address], [place], [place] [address]   Culture, blood (Routine X 2) w Reflex to ID Panel     Status: Abnormal   Collection Time: 11/08/21  5:08 AM   Specimen: BLOOD  Result Value Ref Range Status   Specimen Description  BLOOD SITE NOT SPECIFIED  Final   Special Requests   Final    BOTTLES DRAWN AEROBIC AND ANAEROBIC Blood Culture adequate volume   Culture  Setup Time   Final    GRAM POSITIVE COCCI AEROBIC BOTTLE ONLY CRITICAL RESULT CALLED TO, READ BACK BY AND VERIFIED WITH: C AMEND,PHARMD@0702  11/09/21 [place]    Culture (A)  Final    STAPHYLOCOCCUS EPIDERMIDIS THE SIGNIFICANCE OF ISOLATING THIS ORGANISM FROM A SINGLE  SET OF BLOOD CULTURES WHEN MULTIPLE SETS ARE DRAWN IS UNCERTAIN. PLEASE NOTIFY THE MICROBIOLOGY DEPARTMENT WITHIN ONE WEEK IF SPECIATION AND SENSITIVITIES ARE REQUIRED. Performed at [facility], [place] [address], [place], [place] [address]    Report Status 11/11/2021 FINAL  Final  Culture, blood (Routine X 2) w Reflex to ID Panel     Status: None (Preliminary result)   Collection Time: 11/08/21  5:08 AM   Specimen: BLOOD  Result Value Ref Range Status   Specimen Description BLOOD SITE NOT SPECIFIED  Final   Special Requests   Final    BOTTLES DRAWN AEROBIC AND ANAEROBIC Blood Culture adequate volume   Culture   Final    NO GROWTH 4 DAYS Performed at [facility], [place] [address], [place], [place] [address]    Report Status PENDING  Incomplete  Blood Culture ID Panel (Reflexed)     Status: Abnormal   Collection Time: 11/08/21  5:08 AM  Result Value Ref Range Status   Enterococcus faecalis NOT DETECTED NOT DETECTED Final   Enterococcus Faecium NOT DETECTED NOT DETECTED Final   Listeria monocytogenes NOT DETECTED NOT DETECTED Final   Staphylococcus species DETECTED (A) NOT DETECTED Final    Comment: CRITICAL RESULT CALLED TO, READ BACK BY AND VERIFIED WITH: C AMEND,PHARMD@0700  11/09/21 [place]    Staphylococcus aureus (BCID) NOT DETECTED NOT DETECTED Final   Staphylococcus epidermidis DETECTED (A) NOT DETECTED Final    Comment: CRITICAL RESULT CALLED TO, READ BACK BY AND VERIFIED WITH: C AMEND,PHARMD@0700  11/09/21 [place]    Staphylococcus lugdunensis NOT DETECTED NOT DETECTED Final   Streptococcus species NOT DETECTED NOT DETECTED Final   Streptococcus agalactiae NOT DETECTED NOT DETECTED Final   Streptococcus pneumoniae NOT DETECTED NOT DETECTED Final   Streptococcus pyogenes NOT DETECTED NOT DETECTED Final   A.calcoaceticus-baumannii NOT DETECTED NOT DETECTED Final   Bacteroides fragilis NOT DETECTED NOT DETECTED Final   Enterobacterales NOT DETECTED NOT DETECTED Final    Enterobacter cloacae complex NOT DETECTED NOT DETECTED Final   Escherichia coli NOT DETECTED NOT DETECTED Final   Klebsiella aerogenes NOT DETECTED NOT DETECTED Final   Klebsiella oxytoca NOT DETECTED NOT DETECTED Final   Klebsiella pneumoniae NOT DETECTED NOT DETECTED Final   Proteus species NOT DETECTED NOT DETECTED Final   Salmonella species NOT DETECTED NOT DETECTED Final   Serratia marcescens NOT DETECTED NOT DETECTED Final   Haemophilus influenzae NOT DETECTED NOT DETECTED Final   Neisseria meningitidis NOT DETECTED NOT DETECTED Final   Pseudomonas aeruginosa NOT DETECTED NOT DETECTED Final   Stenotrophomonas maltophilia NOT DETECTED NOT DETECTED Final   Candida albicans NOT DETECTED NOT DETECTED Final   Candida auris NOT DETECTED NOT DETECTED Final   Candida glabrata NOT DETECTED NOT DETECTED Final   Candida krusei NOT DETECTED NOT DETECTED Final   Candida parapsilosis NOT DETECTED NOT DETECTED Final   Candida tropicalis NOT DETECTED NOT DETECTED Final   Cryptococcus neoformans/gattii NOT DETECTED NOT DETECTED Final   Methicillin resistance mecA/C NOT DETECTED NOT DETECTED Final  Comment: Performed at [facility], [place] [address] [place], [place] [address]  Expectorated Sputum Assessment w Gram Stain, Rflx to Resp Cult     Status: None   Collection Time: 11/08/21  5:39 AM   Specimen: Expectorated Sputum  Result Value Ref Range Status   Specimen Description EXPECTORATED SPUTUM  Final   Special Requests NONE  Final   Sputum evaluation   Final    THIS SPECIMEN IS ACCEPTABLE FOR SPUTUM CULTURE Performed at [facility], [place] [address], [place], [place] [address]    Report Status 11/08/2021 FINAL  Final  Culture, Respiratory w Gram Stain     Status: None   Collection Time: 11/08/21  5:39 AM  Result Value Ref Range Status   Specimen Description EXPECTORATED SPUTUM  Final   Special Requests NONE Reflexed from [id number]  Final   Gram Stain   Final    RARE WBC  PRESENT,BOTH PMN AND MONONUCLEAR ABUNDANT GRAM POSITIVE COCCI IN PAIRS IN CLUSTERS RARE GRAM NEGATIVE RODS    Culture   Final    ABUNDANT Normal respiratory flora-no Staph aureus or Pseudomonas seen Performed at [facility], [place] [address] [place], [place] [address]    Report Status 11/10/2021 FINAL  Final  Urine Culture     Status: Abnormal   Collection Time: 11/08/21  3:51 PM   Specimen: Urine, Clean Catch  Result Value Ref Range Status   Specimen Description URINE, CLEAN CATCH  Final   Special Requests   Final    NONE Performed at [facility], [place] [address], [place], [place] [address]    Culture >=100,000 COLONIES/mL ENTEROCOCCUS FAECALIS (A)  Final   Report Status 11/10/2021 FINAL  Final   Organism ID, Bacteria ENTEROCOCCUS FAECALIS (A)  Final      Susceptibility   Enterococcus faecalis - MIC*    AMPICILLIN <=2 SENSITIVE Sensitive     NITROFURANTOIN <=16 SENSITIVE Sensitive     VANCOMYCIN 1 SENSITIVE Sensitive     * >=100,000 COLONIES/mL ENTEROCOCCUS FAECALIS  SARS CORONAVIRUS 2 (TAT 6-24 HRS) Nasopharyngeal Nasopharyngeal Swab     Status: None   Collection Time: 11/10/21  2:26 PM   Specimen: Nasopharyngeal Swab  Result Value Ref Range Status   SARS Coronavirus 2 NEGATIVE NEGATIVE Final    Comment: (NOTE) SARS-CoV-2 target nucleic acids are NOT DETECTED.  The SARS-CoV-2 RNA is generally detectable in upper and lower respiratory specimens during the acute phase of infection. Negative results do not preclude SARS-CoV-2 infection, do not rule out co-infections with other pathogens, and should not be used as the sole basis for treatment or other patient management decisions. Negative results must be combined with clinical observations, patient history, and epidemiological information. The expected result is Negative.  Fact Sheet for Patients: SugarRoll.be  Fact Sheet for Healthcare  Providers: https://www.woods-mathews.com/  This test is not yet approved or cleared by the [place] FDA and  has been authorized for detection and/or diagnosis of SARS-CoV-2 by FDA under an Emergency Use Authorization (EUA). This EUA will remain  in effect (meaning this test can be used) for the duration of the COVID-19 declaration under Se ction 564(b)(1) of the Act, 21 U.S.C. section 360bbb-3(b)(1), unless the authorization is terminated or revoked sooner.  Performed at [facility], [place] [address], [place], [place] [address]      Time coordinating discharge: 35 minutes  SIGNED: Antonieta Pert, MD  [facility] 11/12/2021, 9:39 AM  If 7PM-7AM,  please contact night-coverage [email or web address]

## 2021-11-11 NOTE — Progress Notes (Signed)
Occupational Therapy Treatment Patient Details Name: Amanda Porter MRN: [id number] DOB: Sep 01, 1946 Today's Date: 11/11/2021   History of present illness 75 yo admitted 12/5 after found down at home without O2 on. Pt with seizure enroute and intubated. Extubated 12/6. 12/7 EEG with encephalopathy. Pt with left ulnar fx with splint. PMhx: hydrocephaly with VP shunt, dementia, HTN, anxiety, COPD on home 3L, DM   OT comments  Pt making good progress with functional goals. Pt family present this session. Pt very pleasant, motivated and eager to work with therapy to return home when ready after ST SNF rehab. OT will continue to follow acutely to maximize level of function and safety   Recommendations for follow up therapy are one component of a multi-disciplinary discharge planning process, led by the attending physician.  Recommendations may be updated based on patient status, additional functional criteria and insurance authorization.    Follow Up Recommendations  Skilled nursing-short term rehab (<3 hours/day)    Assistance Recommended at Discharge Frequent or constant Supervision/Assistance  Equipment Recommendations  Other (comment) (TBD at SNF)    Recommendations for Other Services      Precautions / Restrictions Precautions Precautions: Fall       Mobility Bed Mobility Overal bed mobility: Needs Assistance Bed Mobility: Supine to Sit     Supine to sit: Min assist     General bed mobility comments: pt in recliner upon arrival    Transfers Overall transfer level: Needs assistance Equipment used: Rollator (4 wheels) Transfers: Sit to/from Stand Sit to Stand: Min assist;Min guard           General transfer comment: Seeks external support. Min guard for subsequent sit<>stand. Noting difficulty managing O2 tubing while practiciing turning and sitting to Rollator RW, including turning such that she sat on cord x2     Balance Overall balance assessment: Needs  assistance Sitting-balance support: No upper extremity supported Sitting balance-Leahy Scale: Good       Standing balance-Leahy Scale: Poor (approaching fair)                             ADL either performed or assessed with clinical judgement   ADL Overall ADL's : Needs assistance/impaired     Grooming: Wash/dry hands;Wash/dry face;Minimal assistance;Standing           Upper Body Dressing : Minimal assistance;Sitting       Toilet Transfer: Minimal assistance;Min guard;Ambulation;Rollator (4 wheels);Cueing for safety   Toileting- Clothing Manipulation and Hygiene: Minimal assistance;Sit to/from stand       Functional mobility during ADLs: Min guard;Minimal assistance;Rolling walker (2 wheels);Cueing for safety      Extremity/Trunk Assessment Upper Extremity Assessment Upper Extremity Assessment: Generalized weakness   Lower Extremity Assessment Lower Extremity Assessment: Defer to PT evaluation   Cervical / Trunk Assessment Cervical / Trunk Assessment: Normal    Vision Baseline Vision/History: 1 Wears glasses Ability to See in Adequate Light: 0 Adequate Patient Visual Report: No change from baseline     Perception     Praxis      Cognition Arousal/Alertness: Awake/alert Behavior During Therapy: WFL for tasks assessed/performed Overall Cognitive Status: Impaired/Different from baseline Area of Impairment: Orientation;Memory;Following commands;Awareness;Problem solving;Attention;Safety/judgement                   Current Attention Level: Sustained Memory: Decreased short-term memory;Decreased recall of precautions Following Commands: Follows one step commands consistently Safety/Judgement: Decreased awareness of safety;Decreased awareness of deficits Awareness:  Intellectual Problem Solving: Difficulty sequencing;Requires verbal cues;Requires tactile cues General Comments: Noting difficulty managing O2 tubing while practiciing turning  and sitting to Rollator RW, including turning such that she sat on cord x2; Daughter reports pt would at times get tangled in O2 cords at home, and then move her rollator impulsively, "fighting" teh cords -- leading to incr anxiety, impulsivity , and fatigue          Exercises     Shoulder Instructions       General Comments Walked on 3 liters supplemental O2 and sats remained greater than or equal to 92%; HR max 134 bpm    Pertinent Vitals/ Pain       Pain Assessment: No/denies pain Faces Pain Scale: No hurt Facial Expression: Relaxed, neutral Body Movements: Absence of movements Muscle Tension: Relaxed Compliance with ventilator (intubated pts.): N/A Vocalization (extubated pts.): N/A CPOT Total: 0 Pain Intervention(s): Monitored during session;Repositioned  Home Living                                          Prior Functioning/Environment              Frequency  Min 2X/week        Progress Toward Goals  OT Goals(current goals can now be found in the care plan section)  Progress towards OT goals: Progressing toward goals     Plan Discharge plan remains appropriate    Co-evaluation                 AM-PAC OT "6 Clicks" Daily Activity     Outcome Measure   Help from another person eating meals?: None Help from another person taking care of personal grooming?: A Little Help from another person toileting, which includes using toliet, bedpan, or urinal?: A Little Help from another person bathing (including washing, rinsing, drying)?: A Little   Help from another person to put on and taking off regular lower body clothing?: A Little 6 Click Score: 16    End of Session Equipment Utilized During Treatment: Gait belt  OT Visit Diagnosis: Unsteadiness on feet (R26.81);Muscle weakness (generalized) (M62.81);Other symptoms and signs involving cognitive function;History of falling (Z91.81)   Activity Tolerance Patient tolerated treatment  well   Patient Left in chair;with call bell/phone within reach;with family/visitor present;with chair alarm set   Nurse Communication          Time: 7939-0300 OT Time Calculation (min): 27 min  Charges: OT General Charges $OT Visit: 1 Visit OT Treatments $Self Care/Home Management : 8-22 mins $Therapeutic Activity: 8-22 mins    Britt Bottom 11/11/2021, 1:36 PM

## 2021-11-11 NOTE — Social Work (Signed)
Pts insurance [name] is pending. CSW will monitor.   Amanda Reeve, LCSW Clinical Social Worker

## 2021-11-12 ENCOUNTER — Encounter: Payer: Self-pay | Admitting: Internal Medicine

## 2021-11-12 ENCOUNTER — Encounter: Payer: Self-pay | Admitting: Oncology

## 2021-11-12 DIAGNOSIS — N390 Urinary tract infection, site not specified: Secondary | ICD-10-CM | POA: Diagnosis not present

## 2021-11-12 DIAGNOSIS — J9621 Acute and chronic respiratory failure with hypoxia: Secondary | ICD-10-CM | POA: Diagnosis not present

## 2021-11-12 DIAGNOSIS — E119 Type 2 diabetes mellitus without complications: Secondary | ICD-10-CM | POA: Diagnosis not present

## 2021-11-12 DIAGNOSIS — Z7401 Bed confinement status: Secondary | ICD-10-CM | POA: Diagnosis not present

## 2021-11-12 DIAGNOSIS — R0602 Shortness of breath: Secondary | ICD-10-CM | POA: Diagnosis not present

## 2021-11-12 DIAGNOSIS — E43 Unspecified severe protein-calorie malnutrition: Secondary | ICD-10-CM | POA: Diagnosis not present

## 2021-11-12 DIAGNOSIS — M6281 Muscle weakness (generalized): Secondary | ICD-10-CM | POA: Diagnosis not present

## 2021-11-12 DIAGNOSIS — R0989 Other specified symptoms and signs involving the circulatory and respiratory systems: Secondary | ICD-10-CM | POA: Diagnosis not present

## 2021-11-12 DIAGNOSIS — S6292XA Unspecified fracture of left wrist and hand, initial encounter for closed fracture: Secondary | ICD-10-CM | POA: Diagnosis not present

## 2021-11-12 DIAGNOSIS — R2689 Other abnormalities of gait and mobility: Secondary | ICD-10-CM | POA: Diagnosis not present

## 2021-11-12 DIAGNOSIS — R531 Weakness: Secondary | ICD-10-CM | POA: Diagnosis not present

## 2021-11-12 DIAGNOSIS — F4321 Adjustment disorder with depressed mood: Secondary | ICD-10-CM | POA: Diagnosis not present

## 2021-11-12 DIAGNOSIS — I69354 Hemiplegia and hemiparesis following cerebral infarction affecting left non-dominant side: Secondary | ICD-10-CM | POA: Diagnosis not present

## 2021-11-12 DIAGNOSIS — J9600 Acute respiratory failure, unspecified whether with hypoxia or hypercapnia: Secondary | ICD-10-CM | POA: Diagnosis not present

## 2021-11-12 DIAGNOSIS — S52615D Nondisplaced fracture of left ulna styloid process, subsequent encounter for closed fracture with routine healing: Secondary | ICD-10-CM | POA: Diagnosis not present

## 2021-11-12 DIAGNOSIS — G4089 Other seizures: Secondary | ICD-10-CM | POA: Diagnosis not present

## 2021-11-12 DIAGNOSIS — F0390 Unspecified dementia without behavioral disturbance: Secondary | ICD-10-CM | POA: Diagnosis not present

## 2021-11-12 DIAGNOSIS — I10 Essential (primary) hypertension: Secondary | ICD-10-CM | POA: Diagnosis not present

## 2021-11-12 DIAGNOSIS — R569 Unspecified convulsions: Secondary | ICD-10-CM | POA: Diagnosis not present

## 2021-11-12 DIAGNOSIS — R059 Cough, unspecified: Secondary | ICD-10-CM | POA: Diagnosis not present

## 2021-11-12 DIAGNOSIS — J449 Chronic obstructive pulmonary disease, unspecified: Secondary | ICD-10-CM | POA: Diagnosis not present

## 2021-11-12 DIAGNOSIS — J441 Chronic obstructive pulmonary disease with (acute) exacerbation: Secondary | ICD-10-CM | POA: Diagnosis not present

## 2021-11-12 DIAGNOSIS — G919 Hydrocephalus, unspecified: Secondary | ICD-10-CM | POA: Diagnosis not present

## 2021-11-12 DIAGNOSIS — R278 Other lack of coordination: Secondary | ICD-10-CM | POA: Diagnosis not present

## 2021-11-12 DIAGNOSIS — R296 Repeated falls: Secondary | ICD-10-CM | POA: Diagnosis not present

## 2021-11-12 DIAGNOSIS — N183 Chronic kidney disease, stage 3 unspecified: Secondary | ICD-10-CM | POA: Diagnosis not present

## 2021-11-12 DIAGNOSIS — G9340 Encephalopathy, unspecified: Secondary | ICD-10-CM | POA: Diagnosis not present

## 2021-11-12 DIAGNOSIS — F411 Generalized anxiety disorder: Secondary | ICD-10-CM | POA: Diagnosis not present

## 2021-11-12 DIAGNOSIS — Z515 Encounter for palliative care: Secondary | ICD-10-CM | POA: Diagnosis not present

## 2021-11-12 DIAGNOSIS — J189 Pneumonia, unspecified organism: Secondary | ICD-10-CM | POA: Diagnosis not present

## 2021-11-12 LAB — GLUCOSE, CAPILLARY
Glucose-Capillary: 156 mg/dL — ABNORMAL HIGH (ref 70–99)
Glucose-Capillary: 147 mg/dL — ABNORMAL HIGH (ref 70–99)

## 2021-11-12 NOTE — Progress Notes (Signed)
seen examined this am. Aaox3. No acute events overnight She was waiting for insurance authorization, did not get on 12/12 It has been approved 12/13 and being discharged to SNF today in stable condition.

## 2021-11-12 NOTE — Care Management Important Message (Signed)
Important Message  Patient Details  Name: Amanda Porter MRN: [id number] Date of Birth: [birth year]   Medicare Important Message Given:  Yes     Hannah Beat 11/12/2021, 2:06 PM

## 2021-11-12 NOTE — Progress Notes (Signed)
Mobility Specialist Progress Note:   11/12/21 0945  Mobility  Activity Ambulated in hall  Level of Assistance Modified independent, requires aide device or extra time  [place] wheel walker  Distance Ambulated (ft) 200 ft  Mobility Ambulated independently in hallway  Mobility Response Tolerated well  Mobility performed by Mobility specialist  $Mobility charge 1 Mobility   Session preformed on 3LO2. Pt asx during ambulation. Back in bed with all needs met.   Nelta Numbers Mobility Specialist  Phone [phone]

## 2021-11-12 NOTE — TOC Transition Note (Signed)
Transition of Care [facility]) - CM/SW Discharge Note   Patient Details  Name: Amanda Porter MRN: [id number] Date of Birth: [birth year]  Transition of Care [facility]) CM/SW Contact:  Emeterio Reeve, LCSW Phone Number: 11/12/2021, 10:12 AM   Clinical Narrative:     Per MD patient ready for DC to [facility]. RN, patient, patient's family, and facility notified of DC. Discharge Summary and FL2 sent to facility. DC packet on chart. Insurance [name] has been received and pt is covid negative. Ambulance transport requested for patient.    RN to call report to [phone]  CSW will sign off for now as social work intervention is no longer needed. Please consult [place] again if new needs arise.   Final next level of care: Skilled Nursing Facility Barriers to Discharge: Barriers Resolved   Patient Goals and CMS Choice Patient states their goals for this hospitalization and ongoing recovery are:: to get stronger CMS Medicare.gov Compare Post Acute Care list provided to:: Patient Choice offered to / list presented to : Patient, Adult Children  Discharge Placement              Patient chooses bed at: [facility] Patient to be transferred to facility by: [facility] Name of family member notified: Daughter- [name] Patient and family notified of of transfer: 11/12/21  Discharge Plan and Services                                     Social Determinants of Health (SDOH) Interventions     Readmission Risk Interventions No flowsheet data found.   Emeterio Reeve, LCSW Clinical Social Worker

## 2021-11-13 LAB — CULTURE, BLOOD (ROUTINE X 2)
Culture: NO GROWTH
Special Requests: ADEQUATE

## 2021-11-15 ENCOUNTER — Ambulatory Visit (HOSPITAL_BASED_OUTPATIENT_CLINIC_OR_DEPARTMENT_OTHER): Payer: Medicare PPO | Admitting: Cardiology

## 2021-11-18 DIAGNOSIS — R296 Repeated falls: Secondary | ICD-10-CM | POA: Diagnosis not present

## 2021-11-18 DIAGNOSIS — I10 Essential (primary) hypertension: Secondary | ICD-10-CM | POA: Diagnosis not present

## 2021-11-18 DIAGNOSIS — J189 Pneumonia, unspecified organism: Secondary | ICD-10-CM | POA: Diagnosis not present

## 2021-11-18 DIAGNOSIS — G4089 Other seizures: Secondary | ICD-10-CM | POA: Diagnosis not present

## 2021-11-18 DIAGNOSIS — J441 Chronic obstructive pulmonary disease with (acute) exacerbation: Secondary | ICD-10-CM | POA: Diagnosis not present

## 2021-11-18 DIAGNOSIS — N390 Urinary tract infection, site not specified: Secondary | ICD-10-CM | POA: Diagnosis not present

## 2021-11-18 DIAGNOSIS — S6292XA Unspecified fracture of left wrist and hand, initial encounter for closed fracture: Secondary | ICD-10-CM | POA: Diagnosis not present

## 2021-11-18 DIAGNOSIS — G919 Hydrocephalus, unspecified: Secondary | ICD-10-CM | POA: Diagnosis not present

## 2021-11-18 DIAGNOSIS — J9621 Acute and chronic respiratory failure with hypoxia: Secondary | ICD-10-CM | POA: Diagnosis not present

## 2021-11-20 DIAGNOSIS — S6292XA Unspecified fracture of left wrist and hand, initial encounter for closed fracture: Secondary | ICD-10-CM | POA: Diagnosis not present

## 2021-11-20 DIAGNOSIS — G4089 Other seizures: Secondary | ICD-10-CM | POA: Diagnosis not present

## 2021-11-20 DIAGNOSIS — J9621 Acute and chronic respiratory failure with hypoxia: Secondary | ICD-10-CM | POA: Diagnosis not present

## 2021-11-20 DIAGNOSIS — J189 Pneumonia, unspecified organism: Secondary | ICD-10-CM | POA: Diagnosis not present

## 2021-11-20 DIAGNOSIS — G919 Hydrocephalus, unspecified: Secondary | ICD-10-CM | POA: Diagnosis not present

## 2021-11-20 DIAGNOSIS — J441 Chronic obstructive pulmonary disease with (acute) exacerbation: Secondary | ICD-10-CM | POA: Diagnosis not present

## 2021-11-20 DIAGNOSIS — E119 Type 2 diabetes mellitus without complications: Secondary | ICD-10-CM | POA: Diagnosis not present

## 2021-11-20 DIAGNOSIS — N390 Urinary tract infection, site not specified: Secondary | ICD-10-CM | POA: Diagnosis not present

## 2021-11-20 DIAGNOSIS — R296 Repeated falls: Secondary | ICD-10-CM | POA: Diagnosis not present

## 2021-11-22 ENCOUNTER — Non-Acute Institutional Stay: Payer: Medicare PPO | Admitting: Student

## 2021-11-22 ENCOUNTER — Other Ambulatory Visit: Payer: Self-pay

## 2021-11-22 DIAGNOSIS — E119 Type 2 diabetes mellitus without complications: Secondary | ICD-10-CM | POA: Diagnosis not present

## 2021-11-22 DIAGNOSIS — J441 Chronic obstructive pulmonary disease with (acute) exacerbation: Secondary | ICD-10-CM | POA: Diagnosis not present

## 2021-11-22 DIAGNOSIS — R296 Repeated falls: Secondary | ICD-10-CM | POA: Diagnosis not present

## 2021-11-22 DIAGNOSIS — G4089 Other seizures: Secondary | ICD-10-CM | POA: Diagnosis not present

## 2021-11-22 DIAGNOSIS — R569 Unspecified convulsions: Secondary | ICD-10-CM

## 2021-11-22 DIAGNOSIS — N390 Urinary tract infection, site not specified: Secondary | ICD-10-CM | POA: Diagnosis not present

## 2021-11-22 DIAGNOSIS — Z515 Encounter for palliative care: Secondary | ICD-10-CM | POA: Diagnosis not present

## 2021-11-22 DIAGNOSIS — R531 Weakness: Secondary | ICD-10-CM

## 2021-11-22 DIAGNOSIS — R0602 Shortness of breath: Secondary | ICD-10-CM

## 2021-11-22 DIAGNOSIS — G919 Hydrocephalus, unspecified: Secondary | ICD-10-CM | POA: Diagnosis not present

## 2021-11-22 DIAGNOSIS — E43 Unspecified severe protein-calorie malnutrition: Secondary | ICD-10-CM

## 2021-11-22 DIAGNOSIS — J9621 Acute and chronic respiratory failure with hypoxia: Secondary | ICD-10-CM | POA: Diagnosis not present

## 2021-11-22 DIAGNOSIS — F0390 Unspecified dementia without behavioral disturbance: Secondary | ICD-10-CM | POA: Diagnosis not present

## 2021-11-22 DIAGNOSIS — J189 Pneumonia, unspecified organism: Secondary | ICD-10-CM | POA: Diagnosis not present

## 2021-11-22 DIAGNOSIS — S6292XA Unspecified fracture of left wrist and hand, initial encounter for closed fracture: Secondary | ICD-10-CM | POA: Diagnosis not present

## 2021-11-25 NOTE — Progress Notes (Signed)
[facility] Palliative Care Consult Note Telephone: [phone]  Fax: [phone]   Date of encounter: 11/22/2021 9:24 AM PATIENT NAME: Amanda Porter [address] [place] [address]   [phone] (home)  DOB: 03/18/1946 MRN: [id number] PRIMARY CARE PROVIDER:    Pleas Koch, NP,  [address] [place] [address] [phone]  REFERRING PROVIDER:   Vilinda Boehringer, NP  RESPONSIBLE PARTY:    Contact Information     Name Relation Home Work Mobile   Amanda Porter Daughter [phone]     [facility] Daughter [phone]  [phone]   Amanda Porter [phone]  [phone]   Amanda Porter [phone]  [phone]        I met face to face with patient and family in the facility. Palliative Care was asked to follow this patient by consultation request of  Amanda Boehringer, NP to address advance care planning and complex medical decision making. This is the initial visit.                                     ASSESSMENT AND PLAN / RECOMMENDATIONS:   Advance Care Planning/Goals of Care: Goals include to maximize quality of life and symptom management. Patient/health care surrogate gave his/her permission to discuss.Our advance care planning conversation included a discussion about:    The value and importance of advance care planning  Experiences with loved ones who have been seriously ill or have died  Exploration of personal, cultural or spiritual beliefs that might influence medical decisions  Exploration of goals of care in the event of a sudden injury or illness  MOST form reviewed.  CODE STATUS: Full Code  Education provided on Palliative Medicine vs. Hospice services. Patient to discharge home on Monday. Plans for [facility] therapy. She is to follow up with orthopedics regarding wrist fracture. Patient and family would like for palliative medicine to continue following patient in the community.    Symptom Management/Plan:  Dementia-family to continue providing supportive care. Discussed adding additional support in the home when she returns home. Recommend Lifeline device or similar. Reorient/redirect as needed.   Shortness of breath-secondary to COPD, respiratory failure. Patient currently being treated for CAP. Continue wearing oxygen continuously at 3 LPM. Continue inhalers, Augmentin and doxycycline for CAP as directed through 11/23/21.   Protein calorie malnutrition-recommend foods patient enjoys. Continue Glucerna BID as ordered. Monitor for ongoing weight loss.   Weakness-continue PT/OT as directed. Patient with tentative discharge home on Monday. Assistive device recommended for ambulation. Monitor for falls/safety.  Seizures-no seizure activity reported since hospitalization. Continue Keppra as directed. Follow up with neurology as scheduled.   Follow up Palliative Care Visit: Palliative care will continue to follow for complex medical decision making, advance care planning, and clarification of goals. Return in 6-8 weeks or prn.  I spent 45 minutes providing this consultation. More than 50% of the time in this consultation was spent in counseling and care coordination.   PPS: 50%  HOSPICE ELIGIBILITY/DIAGNOSIS: TBD  Chief Complaint: Palliative Medicine initial consult.   HISTORY OF PRESENT ILLNESS:  Amanda Porter is a 75 y.o. year old female  with diagnosis of acute respiratory failure, protein calorie malnutrition, dementia, GTC seizures, COPD, type 2 diabetes, hyperlipidemia, anemia of chronic disease, iron deficiency anemia, non displaced ulnar styloid fracture, left wrist. History of VP shunt in 2018, history of left upper lobe lobectomy for  lung cancer in 2000. Patient recently hospitalized 12/5 through 11/12/2021.  Patient is currently at [facility]. She is currently receiving skilled therapy. She was started on amoxicillin and doxycycline due to  new dx of pneumonia. She reports feeling better. She denies pain; does endorse dyspnea with exertion. She is wearing oxygen at 3 lpm. She endorses weakness improving. She does report her weight being down to 110 pounds; baseline weight is 115 pounds. Her appetite has been poor to fair. She is receiving glucerna BID.     History obtained from review of EMR, discussion with primary team, and interview with family, facility staff/caregiver and/or Amanda Porter.  I reviewed available labs, medications, imaging, studies and related documents from the EMR.  Records reviewed and summarized above.   ROS  General: NAD EYES: denies vision changes ENMT: denies dysphagia Cardiovascular: denies chest pain Pulmonary: occasional cough, SOB with exertion Abdomen: denies constipation, endorses continence of bowel GU: denies dysuria, endorses continence of urine MSK: + weakness,  no falls reported since admission Skin: denies rashes or wounds Neurological: denies pain, denies insomnia Psych: Endorses stable mood Heme/lymph/immuno: denies bruises, abnormal bleeding  Physical Exam: Weight: 110 pounds Pulse 92, resp 18, sats 98% at  3lpm Constitutional: NAD General: frail appearing, thin EYES: anicteric sclera, lids intact, no discharge  ENMT: intact hearing, oral mucous membranes moist, dentition intact CV: S1S2, RRR, no LE edema Pulmonary: LCTA, diminished, no increased work of breathing, occasional cough Abdomen: normo-active BS + 4 quadrants, soft and non tender, no ascites GU: deferred MSK: sarcopenia, moves all extremities, ambulatory Skin: warm and dry, no rashes or wounds on visible skin Neuro: generalized weakness, A & O x 3, forgetful Psych: non-anxious affect, pleasant Hem/lymph/immuno: no widespread bruising CURRENT PROBLEM LIST:  Patient Active Problem List   Diagnosis Date Noted   Protein-calorie malnutrition, severe 11/07/2021   Acute respiratory failure requiring reintubation ([place])  11/05/2021   Tardive dyskinesia 02/01/2020   Preventative health care 01/24/2020   Lightheadedness 09/05/2019   Cervical spondylolysis 05/24/2019   Chronic respiratory failure with hypoxia (HCC) 02/25/2019   Postural dizziness 08/11/2018   Iron deficiency anemia 07/02/2018   Anemia 04/12/2018   Type 2 diabetes mellitus without complication, without long-term current use of insulin ([place]) 02/18/2018   Pain of left lower extremity 11/26/2017   Memory disturbance 11/20/2017   Right carotid bruit 11/20/2017   Hyperlipidemia 09/14/2017   COPD mixed type ([place]) 09/14/2017   Dementia without behavioral disturbance ([place]) 09/14/2017   Normal pressure hydrocephalus ([place]) 09/14/2017   Dysphagia 09/14/2017   Essential hypertension 09/14/2017   History of CVA (cerebrovascular accident) 09/14/2017   Anxiety and depression 09/14/2017   Tobacco use disorder 12/10/2016   PAST MEDICAL HISTORY:  Active Ambulatory Problems    Diagnosis Date Noted   Hyperlipidemia 09/14/2017   COPD mixed type ([place]) 09/14/2017   Dementia without behavioral disturbance ([place]) 09/14/2017   Normal pressure hydrocephalus ([place]) 09/14/2017   Dysphagia 09/14/2017   Essential hypertension 09/14/2017   History of CVA (cerebrovascular accident) 09/14/2017   Anxiety and depression 09/14/2017   Memory disturbance 11/20/2017   Right carotid bruit 11/20/2017   Pain of left lower extremity 11/26/2017   Tobacco use disorder 12/10/2016   Type 2 diabetes mellitus without complication, without long-term current use of insulin ([place]) 02/18/2018   Anemia 04/12/2018   Iron deficiency anemia 07/02/2018   Postural dizziness 08/11/2018   Chronic respiratory failure with hypoxia ([place]) 02/25/2019   Cervical spondylolysis 05/24/2019   Lightheadedness  09/05/2019   Preventative health care 01/24/2020   Tardive dyskinesia 02/01/2020   Acute respiratory failure requiring reintubation ([place]) 11/05/2021   Protein-calorie malnutrition, severe  11/07/2021   Resolved Ambulatory Problems    Diagnosis Date Noted   History of CVA (cerebrovascular accident) 09/05/2019   Past Medical History:  Diagnosis Date   Anxiety    COPD (chronic obstructive pulmonary disease) (HCC)    CVA (cerebral vascular accident) ([place])    CVA (cerebral vascular accident) ([place])    Dementia ([place])    Depression    Diabetes mellitus ([place])    Diabetes mellitus without complication ([place])    Dysphasia    History of lung cancer    Retinal detachment    Thrombosis    SOCIAL HX:  Social History   Tobacco Use   Smoking status: Former    Packs/day: 0.50    Years: 50.00    Pack years: 25.00    Types: Cigarettes    Quit date: 08/01/2017    Years since quitting: 4.3   Smokeless tobacco: Never  Substance Use Topics   Alcohol use: Not Currently   FAMILY HX:  Family History  Problem Relation Age of Onset   Pneumonia Mother    Cancer Father    Diabetes Sister    Diabetes Sister    Breast cancer Neg Hx      ALLERGIES:  Allergies  Allergen Reactions   Lexapro [Escitalopram Oxalate] Other (See Comments)    Dizziness, nausea, fatigue   Lexapro [Escitalopram]    Neosporin  [Neomycin-Polymyxin-Gramicidin] Rash    (Neosporin)   Neosporin [Bacitracin-Polymyxin B] Rash     PERTINENT MEDICATIONS:  Outpatient Encounter Medications as of 11/22/2021  Medication Sig   albuterol (PROVENTIL) (2.5 MG/3ML) 0.083% nebulizer solution USE 1 VIAL IN NEBULIZER EVERY 6 HOURS   albuterol (VENTOLIN HFA) 108 (90 Base) MCG/ACT inhaler INHALE 2 PUFFS BY MOUTH EVERY 4 HOURS AS NEEDED   albuterol (VENTOLIN HFA) 108 (90 Base) MCG/ACT inhaler 2 puffs every 4 (four) hours as needed for wheezing or shortness of breath.   alendronate (FOSAMAX) 70 MG tablet Take with a full glass of water on an empty stomach for bone density.   ASHWAGANDHA PO Take 1 capsule by mouth daily as needed (anxiety).   blood glucose meter kit and supplies KIT Dispense based on patient and insurance  preference. Use up to four times daily as directed. (FOR ICD-9 250.00, 250.01).   buPROPion (WELLBUTRIN XL) 150 MG 24 hr tablet 3  qam   busPIRone (BUSPAR) 15 MG tablet 3  qam   2 qpm   busPIRone (BUSPAR) 15 MG tablet Take 3 tablets (45 mg total) by mouth daily.   busPIRone (BUSPAR) 30 MG tablet Take 1 tablet (30 mg total) by mouth at bedtime.   CALCIUM PO Take 1 capsule by mouth daily.   clopidogrel (PLAVIX) 75 MG tablet TAKE 1 TABLET BY MOUTH EVERY DAY   clopidogrel (PLAVIX) 75 MG tablet Take 75 mg by mouth daily.   Ferrous Sulfate (IRON PO) Take by mouth.   Ferrous Sulfate (IRON PO) Take 1 capsule by mouth daily.   fluticasone furoate-vilanterol (BREO ELLIPTA) 200-25 MCG/ACT AEPB Inhale 1 puff into the lungs daily.   folic acid (FOLVITE) 1 MG tablet Take 1 tablet (1 mg total) by mouth daily.   glipiZIDE (GLUCOTROL XL) 2.5 MG 24 hr tablet Take 1 tablet (2.5 mg total) by mouth daily with breakfast. For diabetes.   glipiZIDE (GLUCOTROL XL) 2.5 MG 24 hr  tablet Take 2.5 mg by mouth every morning.   Glycopyrrolate-Formoterol (BEVESPI AEROSPHERE) 9-4.8 MCG/ACT AERO Inhale 2 puffs, twice daily (Patient not taking: Reported on 07/03/2021)   guaiFENesin (MUCINEX) 600 MG 12 hr tablet Take 1 tablet (600 mg total) by mouth 2 (two) times daily.   LORazepam (ATIVAN) 0.5 MG tablet 1  q am   LORazepam (ATIVAN) 0.5 MG tablet Take 1 tablet (0.5 mg total) by mouth 3 times/day as needed-between meals & bedtime for up to 4 doses for anxiety, seizure or sleep.   Multiple Vitamin (MULTIVITAMIN) capsule Take 1 capsule by mouth daily.   OXYGEN 3 L continuous.    polyethylene glycol (MIRALAX / GLYCOLAX) 17 g packet Take 17 g by mouth daily as needed for moderate constipation.   rosuvastatin (CRESTOR) 20 MG tablet TAKE 1 TABLET (20 MG TOTAL) BY MOUTH EVERY EVENING. FOR CHOLESTEROL.   rosuvastatin (CRESTOR) 20 MG tablet Take 20 mg by mouth daily.   senna-docusate (SENOKOT-S) 8.6-50 MG tablet Take 1 tablet by mouth 2  (two) times daily.   tamsulosin (FLOMAX) 0.4 MG CAPS capsule Take 1 capsule (0.4 mg total) by mouth daily after breakfast.   Tiotropium Bromide-Olodaterol (STIOLTO RESPIMAT) 2.5-2.5 MCG/ACT AERS Inhale 2 puffs into the lungs daily.   umeclidinium bromide (INCRUSE ELLIPTA) 62.5 MCG/ACT AEPB Inhale 1 puff into the lungs daily.   vitamin B-12 1000 MCG tablet Take 1 tablet (1,000 mcg total) by mouth daily.   No facility-administered encounter medications on file as of 11/22/2021.   Thank you for the opportunity to participate in the care of Ms. Scheirer.  The palliative care team will continue to follow. Please call our office at [phone] if we can be of additional assistance.   Ezekiel Slocumb, NP ,   COVID-19 PATIENT SCREENING TOOL Asked and negative response unless otherwise noted:  Have you had symptoms of covid, tested positive or been in contact with someone with symptoms/positive test in the past 5-10 days? No

## 2021-11-29 DIAGNOSIS — J449 Chronic obstructive pulmonary disease, unspecified: Secondary | ICD-10-CM | POA: Diagnosis not present

## 2021-12-03 ENCOUNTER — Telehealth: Payer: Self-pay | Admitting: Primary Care

## 2021-12-03 NOTE — Telephone Encounter (Signed)
Home Health verbal orders [place] Name: [place] number: [phone]  Requesting OT/PT/Skilled nursing/Social Work/Speech:  Reason:Skilled Nursing  Frequency:1 wk for 3 wks and once every other week for 6 weeks  Please forward to [facility] pool or providers CMA

## 2021-12-03 NOTE — Telephone Encounter (Signed)
Number will not go through as completed. Will close message and give information when they call back.

## 2021-12-03 NOTE — Telephone Encounter (Signed)
Approved.  

## 2021-12-04 ENCOUNTER — Ambulatory Visit: Payer: Medicare PPO | Admitting: Primary Care

## 2021-12-06 ENCOUNTER — Telehealth: Payer: Self-pay | Admitting: Primary Care

## 2021-12-06 NOTE — Telephone Encounter (Signed)
Amanda Porter with [facility] called stating that the family would like for the pt to continue Palliative Care. Please advise.

## 2021-12-06 NOTE — Telephone Encounter (Signed)
Called number given went to voice mail that was not person listed on message will call back later.

## 2021-12-06 NOTE — Telephone Encounter (Signed)
Approved.  

## 2021-12-10 NOTE — Telephone Encounter (Signed)
Langley Gauss called in related the message

## 2021-12-11 ENCOUNTER — Telehealth: Payer: Self-pay

## 2021-12-11 NOTE — Telephone Encounter (Signed)
Attempted to contact patient's daughter [name] to schedule a Palliative Care consult appointment. No answer left a message to return call.

## 2021-12-13 ENCOUNTER — Other Ambulatory Visit: Payer: Self-pay

## 2021-12-13 ENCOUNTER — Ambulatory Visit (INDEPENDENT_AMBULATORY_CARE_PROVIDER_SITE_OTHER)
Admission: RE | Admit: 2021-12-13 | Discharge: 2021-12-13 | Disposition: A | Discharge status: Home / Self Care | Payer: Medicare PPO | Source: Ambulatory Visit | Attending: Primary Care | Admitting: Primary Care

## 2021-12-13 ENCOUNTER — Ambulatory Visit: Payer: Medicare PPO | Admitting: Primary Care

## 2021-12-13 ENCOUNTER — Telehealth: Payer: Self-pay | Admitting: *Deleted

## 2021-12-13 ENCOUNTER — Encounter: Payer: Self-pay | Admitting: Primary Care

## 2021-12-13 ENCOUNTER — Other Ambulatory Visit: Payer: Self-pay | Admitting: Primary Care

## 2021-12-13 VITALS — BP 108/60 | HR 109 | Temp 98.3°F | Ht 63.0 in | Wt 108.0 lb

## 2021-12-13 DIAGNOSIS — J101 Influenza due to other identified influenza virus with other respiratory manifestations: Secondary | ICD-10-CM | POA: Diagnosis not present

## 2021-12-13 DIAGNOSIS — R051 Acute cough: Secondary | ICD-10-CM

## 2021-12-13 DIAGNOSIS — J9611 Chronic respiratory failure with hypoxia: Secondary | ICD-10-CM | POA: Diagnosis not present

## 2021-12-13 DIAGNOSIS — G912 (Idiopathic) normal pressure hydrocephalus: Secondary | ICD-10-CM

## 2021-12-13 DIAGNOSIS — F32A Depression, unspecified: Secondary | ICD-10-CM

## 2021-12-13 DIAGNOSIS — R0600 Dyspnea, unspecified: Secondary | ICD-10-CM | POA: Diagnosis not present

## 2021-12-13 DIAGNOSIS — S62102S Fracture of unspecified carpal bone, left wrist, sequela: Secondary | ICD-10-CM | POA: Diagnosis not present

## 2021-12-13 DIAGNOSIS — F419 Anxiety disorder, unspecified: Secondary | ICD-10-CM | POA: Diagnosis not present

## 2021-12-13 DIAGNOSIS — E876 Hypokalemia: Secondary | ICD-10-CM

## 2021-12-13 DIAGNOSIS — R339 Retention of urine, unspecified: Secondary | ICD-10-CM | POA: Diagnosis not present

## 2021-12-13 DIAGNOSIS — R918 Other nonspecific abnormal finding of lung field: Secondary | ICD-10-CM

## 2021-12-13 HISTORY — DX: Influenza due to other identified influenza virus with other respiratory manifestations: J10.1

## 2021-12-13 LAB — CBC WITH DIFFERENTIAL/PLATELET
Basophils Absolute: 0 10*3/uL (ref 0.0–0.1)
Basophils Relative: 0.5 % (ref 0.0–3.0)
Eosinophils Absolute: 0.1 10*3/uL (ref 0.0–0.7)
Eosinophils Relative: 1 % (ref 0.0–5.0)
HCT: 32.7 % — ABNORMAL LOW (ref 36.0–46.0)
Hemoglobin: 10.2 g/dL — ABNORMAL LOW (ref 12.0–15.0)
Lymphocytes Relative: 8.3 % — ABNORMAL LOW (ref 12.0–46.0)
Lymphs Abs: 0.6 10*3/uL — ABNORMAL LOW (ref 0.7–4.0)
MCHC: 31.1 g/dL (ref 30.0–36.0)
MCV: 92.4 fl (ref 78.0–100.0)
Monocytes Absolute: 1 10*3/uL (ref 0.1–1.0)
Monocytes Relative: 12.8 % — ABNORMAL HIGH (ref 3.0–12.0)
Neutro Abs: 5.8 10*3/uL (ref 1.4–7.7)
Neutrophils Relative %: 77.4 % — ABNORMAL HIGH (ref 43.0–77.0)
Platelets: 235 10*3/uL (ref 150.0–400.0)
RBC: 3.54 Mil/uL — ABNORMAL LOW (ref 3.87–5.11)
RDW: 17.3 % — ABNORMAL HIGH (ref 11.5–15.5)
WBC: 7.4 10*3/uL (ref 4.0–10.5)

## 2021-12-13 LAB — BASIC METABOLIC PANEL
BUN: 16 mg/dL (ref 6–23)
CO2: 33 mEq/L — ABNORMAL HIGH (ref 19–32)
Calcium: 9.1 mg/dL (ref 8.4–10.5)
Chloride: 98 mEq/L (ref 96–112)
Creatinine, Ser: 0.69 mg/dL (ref 0.40–1.20)
GFR: 84.09 mL/min (ref >60.00)
Glucose, Bld: 191 mg/dL — ABNORMAL HIGH (ref 70–99)
Potassium: 3.4 mEq/L — ABNORMAL LOW (ref 3.5–5.1)
Sodium: 140 mEq/L (ref 135–145)

## 2021-12-13 LAB — POCT INFLUENZA A/B
Influenza A, POC: POSITIVE — AB
Influenza B, POC: NEGATIVE

## 2021-12-13 LAB — POC COVID19 BINAXNOW: SARS Coronavirus 2 Ag: NEGATIVE

## 2021-12-13 MED ORDER — POTASSIUM CHLORIDE CRYS ER 20 MEQ PO TBCR
20.0000 meq | EXTENDED_RELEASE_TABLET | Freq: Two times a day (BID) | ORAL | 0 refills | Status: DC
Start: 2021-12-13 — End: 2022-01-10

## 2021-12-13 MED ORDER — PREDNISONE 20 MG PO TABS: ORAL_TABLET | ORAL | 0 refills | Status: DC

## 2021-12-13 MED ORDER — FOLIC ACID 1 MG PO TABS: 1.0000 mg | ORAL_TABLET | Freq: Every day | ORAL | 3 refills | Status: DC

## 2021-12-13 MED ORDER — AZITHROMYCIN 250 MG PO TABS
ORAL_TABLET | ORAL | 0 refills | Status: DC
Start: 2021-12-13 — End: 2022-01-10

## 2021-12-13 MED ORDER — TAMSULOSIN HCL 0.4 MG PO CAPS: 0.4000 mg | ORAL_CAPSULE | Freq: Every day | ORAL | 1 refills | Status: DC

## 2021-12-13 MED ORDER — OSELTAMIVIR PHOSPHATE 75 MG PO CAPS
75.0000 mg | ORAL_CAPSULE | Freq: Two times a day (BID) | ORAL | 0 refills | Status: DC
Start: 2021-12-13 — End: 2022-01-10

## 2021-12-13 NOTE — Assessment & Plan Note (Signed)
Seems to be doing well on Flomax 0.5 mg daily, continue same.

## 2021-12-13 NOTE — Assessment & Plan Note (Signed)
Evaluated by neurology during hospital visit, and NPH is stable.

## 2021-12-13 NOTE — Assessment & Plan Note (Signed)
Diagnosed today given acute cough symptoms.  Treat with Tamiflu 75 mg twice daily x5 days. Checking chest x-ray and labs today.  She does appear stable and fortunately oxygen saturation improved with increased supplemental oxygen.  Strict ED precautions provided to both patient and her daughter.  Her daughter works as a Audiological scientist and monitors her closely.

## 2021-12-13 NOTE — Assessment & Plan Note (Addendum)
Chronic and dependent on supplemental oxygen  Oxygen saturation improved to 96%-98% on 4 L of submental oxygen.  Patient tested positive for influenza A today in our clinic.  Negative COVID test.  We will treat for influenza.  Continue nebulizers and prescribed inhalers.  Recommended she follow-up with pulmonology.  Checking chest x-ray today.  Strict ED precautions provided.  Hospital labs, imaging, notes reviewed.

## 2021-12-13 NOTE — Assessment & Plan Note (Signed)
Obtained after fall 1 month ago.  Continue wrist brace. Follow-up with orthopedics

## 2021-12-13 NOTE — Chronic Care Management (AMB) (Signed)
Chronic Care Management   Note  12/13/2021 Name: [name] MRN: [id number] DOB: 1946/07/22  Amanda Porter is a 76 y.o. year old female who is a primary care patient of Pleas Koch, NP. I reached out to [place] by phone today in response to a referral sent by [name]'s PCP.  [name] was given information about Chronic Care Management services today including:  CCM service includes personalized support from designated clinical staff supervised by her physician, including individualized plan of care and coordination with other care providers 24/7 contact phone numbers for assistance for urgent and routine care needs. Service will only be billed when office clinical staff spend 20 minutes or more in a month to coordinate care. Only one practitioner may furnish and bill the service in a calendar month. The patient may stop CCM services at any time (effective at the end of the month) by phone call to the office staff. The patient is responsible for co-pay (up to 20% after annual deductible is met) if co-pay is required by the individual health plan.   Patient agreed to services and verbal consent obtained.   Follow up plan: Telephone appointment with care management team member scheduled for: 12/18/2021  Julian Hy, [place] Management  Direct Dial: [phone]

## 2021-12-13 NOTE — Telephone Encounter (Signed)
Called and let them know verbal ok has been received. No further action needed.

## 2021-12-13 NOTE — Patient Instructions (Signed)
Stop by the lab and xray prior to leaving today. I will notify you of your results once received.   Start Tamiflu 75 mg. Take 1 capsule by mouth twice daily for 5 days.  Start prednisone 20 mg tablets. Take 2 tablets by mouth once daily for 5 days.  Follow up with your lung doctor.   Monitor your oxygen levels.   Go to the hospital if you get worse.  Schedule a follow up visit with me for 3 months.  It was a pleasure to see you today!

## 2021-12-13 NOTE — Progress Notes (Signed)
Subjective:    Patient ID: Amanda Porter, female    DOB: Oct 17, 1946, 76 y.o.   MRN: [id number]  HPI  Amanda Porter is a very pleasant 76 y.o. female with a significant medical history including COPD, chronic respiratory failure with hypoxia, dementia, GAD, tobacco abuse, type 2 diabetes, hypertension, CVA, hyperlipidemia, normal pressure hydrocephalus who presents today for hospital follow up. Her daughter accompanies her who is providing most of the information for HPI.  Presented to [facility] on 11/04/[facility] via EMS with unresponsiveness, fall at home, ecchymosis to left eye, and seizure activity en route per EMS. Patient's family endorsed missing medications from her medication box, thinks patient took 1500 mg of Wellbutrin.  CT and x-rays obtained in the ED were negative for cervical spine fracture/head bleed.  No traumatic brain injury.  She was intubated and admitted for further evaluation.  During her hospital stay neurology consulted, EEG completed which was negative.  Neurology suspects that the extremely high levels of Wellbutrin caused her seizures.  She was initiated on Keppra.  Pulmonology consulted.  Fortunately patient was able to extubate on 11/06/2021, managed at 3 L for duration of hospital visit.  X-ray of the left wrist showed nondisplaced ulnar styloid fracture.  Splint was placed.  She is to follow-up with orthopedics in the outpatient setting.  Patient also treated for acute cystitis, culture positive for Enterococcus for faecalis, treated with fosfomycin for 1 dose.  She was discharged to SNF on 11/11/2021, [name], with recommendations for outpatient neurology follow-up, discontinuation of Wellbutrin, prescription for Flomax for neurogenic bladder as she had some urinary retention.  Her daughter states that she was at [facility] for 12 days, has been home for a few days now.  Since her discharge home she is not much better. She developed a cough three days ago, increased  dyspnea. Her daughter was recently ill with the same symptoms. Her daughter has noticed increased exertional dyspnea. This morning her daughter endorses that she was confused during their drive over to our office.   She is compliant to her home oxygen at 3 liters. She is compliant to her Stiolto Respimat inhaler, is using albuterol nebulizer treatments 3-4 times daily but doesn't finish the treatment.   After sitting in our lobby for quite some time today, oxygen saturation was 85% on 3 liters of oxygen so oxygen had to be increased to 4 liters with improvement to 96%.   She is not taking Wellbutrin. She lives with her daughter who is essentially taking care of her solely. She is looking for help. The patient cannot cook, meal prep, medication management. She can eat and bathe herself.   She is compliant to her Flomax 0.4 mg daily. Is needing refills.   Review of Systems  Constitutional:  Positive for fatigue.  HENT:  Negative for congestion and sore throat.   Respiratory:  Positive for cough and shortness of breath.   Genitourinary:  Negative for difficulty urinating.  Neurological:  Negative for seizures.  Psychiatric/Behavioral:  Positive for confusion.         Past Medical History:  Diagnosis Date   Anxiety    Anxiety and depression    COPD (chronic obstructive pulmonary disease) ([place])    CVA (cerebral vascular accident) ([place])    CVA (cerebral vascular accident) ([place])    L sided deficits   Dementia ([place])    Depression    Diabetes mellitus ([place])    Type 2   Diabetes mellitus without complication (  [place])    Dysphagia    Dysphasia    History of lung cancer    Hyperlipidemia    Memory disturbance 11/20/2017   Normal pressure hydrocephalus ([place]) 2018   Retinal detachment    Right   Right carotid bruit 11/20/2017   Tardive dyskinesia 02/01/2020   Tardive dyskinesia    Thrombosis    Arterial to lower extremity?    Social History   Socioeconomic History   Marital status:  Divorced    Spouse name: Not on file   Number of children: 1   Years of education: trade school   Highest education level: Not on file  Occupational History   Occupation: Retired  Tobacco Use   Smoking status: Former    Packs/day: 0.50    Years: 50.00    Pack years: 25.00    Types: Cigarettes    Quit date: 08/01/2017    Years since quitting: 4.3   Smokeless tobacco: Never  Vaping Use   Vaping Use: Never used  Substance and Sexual Activity   Alcohol use: Not Currently   Drug use: Not Currently   Sexual activity: Not Currently  Other Topics Concern   Not on file  Social History Narrative   ** Merged History Encounter **       Moved from [place]. Lives with daughter. Right handed    Social Determinants of Health   Financial Resource Strain: Not on file  Food Insecurity: Not on file  Transportation Needs: Not on file  Physical Activity: Not on file  Stress: Not on file  Social Connections: Not on file  Intimate Partner Violence: Not on file    Past Surgical History:  Procedure Laterality Date   LUNG REMOVAL, PARTIAL     left upper lobe   VENTRICULOPERITONEAL SHUNT  12/09/2016   VENTRICULOPERITONEAL SHUNT      Family History  Problem Relation Age of Onset   Pneumonia Mother    Cancer Father    Diabetes Sister    Diabetes Sister    Breast cancer Neg Hx     Allergies  Allergen Reactions   Lexapro [Escitalopram Oxalate] Other (See Comments)    Dizziness, nausea, fatigue   Lexapro [Escitalopram]    Neosporin  [Neomycin-Polymyxin-Gramicidin] Rash    (Neosporin)   Neosporin [Bacitracin-Polymyxin B] Rash    Current Outpatient Medications on File Prior to Visit  Medication Sig Dispense Refill   albuterol (PROVENTIL) (2.5 MG/3ML) 0.083% nebulizer solution USE 1 VIAL IN NEBULIZER EVERY 6 HOURS 360 mL 11   albuterol (VENTOLIN HFA) 108 (90 Base) MCG/ACT inhaler INHALE 2 PUFFS BY MOUTH EVERY 4 HOURS AS NEEDED 18 g 12   albuterol (VENTOLIN HFA) 108 (90 Base) MCG/ACT  inhaler 2 puffs every 4 (four) hours as needed for wheezing or shortness of breath.     alendronate (FOSAMAX) 70 MG tablet Take with a full glass of water on an empty stomach for bone density. 12 tablet 3   ASHWAGANDHA PO Take 1 capsule by mouth daily as needed (anxiety).     blood glucose meter kit and supplies KIT Dispense based on patient and insurance preference. Use up to four times daily as directed. (FOR ICD-9 250.00, 250.01). 1 each 0   busPIRone (BUSPAR) 15 MG tablet 3  qam   2 qpm 150 tablet 4   CALCIUM PO Take 1 capsule by mouth daily.     clopidogrel (PLAVIX) 75 MG tablet TAKE 1 TABLET BY MOUTH EVERY DAY 90 tablet 3  Ferrous Sulfate (IRON PO) Take by mouth.     folic acid (FOLVITE) 1 MG tablet Take 1 tablet (1 mg total) by mouth daily.     glipiZIDE (GLUCOTROL XL) 2.5 MG 24 hr tablet Take 1 tablet (2.5 mg total) by mouth daily with breakfast. For diabetes. 90 tablet 3   guaiFENesin (MUCINEX) 600 MG 12 hr tablet Take 1 tablet (600 mg total) by mouth 2 (two) times daily.     LORazepam (ATIVAN) 0.5 MG tablet 1  q am 30 tablet 4   Multiple Vitamin (MULTIVITAMIN) capsule Take 1 capsule by mouth daily.     OXYGEN 3 L continuous.      rosuvastatin (CRESTOR) 20 MG tablet TAKE 1 TABLET (20 MG TOTAL) BY MOUTH EVERY EVENING. FOR CHOLESTEROL. 90 tablet 3   tamsulosin (FLOMAX) 0.4 MG CAPS capsule Take 1 capsule (0.4 mg total) by mouth daily after breakfast. 30 capsule    Tiotropium Bromide-Olodaterol (STIOLTO RESPIMAT) 2.5-2.5 MCG/ACT AERS Inhale 2 puffs into the lungs daily. 4 g 0   vitamin B-12 1000 MCG tablet Take 1 tablet (1,000 mcg total) by mouth daily.     No current facility-administered medications on file prior to visit.    BP 108/60    Pulse (!) 109    Temp 98.3 F (36.8 C) (Temporal)    Ht '5\' 3"'  (1.6 m)    Wt 108 lb (49 kg)    SpO2 96%    BMI 19.13 kg/m  Objective:   Physical Exam Constitutional:      General: She is not in acute distress.    Appearance: She is not  ill-appearing.  Cardiovascular:     Rate and Rhythm: Normal rate and regular rhythm.  Pulmonary:     Effort: Pulmonary effort is normal.     Breath sounds: Normal breath sounds.     Comments: Deep congested cough noted a few times during visit. Musculoskeletal:     Cervical back: Neck supple.  Skin:    General: Skin is warm and dry.  Neurological:     Mental Status: She is oriented to person, place, and time.  Psychiatric:        Mood and Affect: Mood normal.          Assessment & Plan:      This visit occurred during the SARS-CoV-2 public health emergency.  Safety protocols were in place, including screening questions prior to the visit, additional usage of staff PPE, and extensive cleaning of exam room while observing appropriate contact time as indicated for disinfecting solutions.

## 2021-12-13 NOTE — Assessment & Plan Note (Signed)
Wellbutrin discontinued after overdose causing seizure-like activity.  Recommended that both she and her daughter update her psychiatrist.

## 2021-12-17 DIAGNOSIS — J9600 Acute respiratory failure, unspecified whether with hypoxia or hypercapnia: Secondary | ICD-10-CM | POA: Diagnosis not present

## 2021-12-17 DIAGNOSIS — Z7984 Long term (current) use of oral hypoglycemic drugs: Secondary | ICD-10-CM

## 2021-12-17 DIAGNOSIS — S62102D Fracture of unspecified carpal bone, left wrist, subsequent encounter for fracture with routine healing: Secondary | ICD-10-CM | POA: Diagnosis not present

## 2021-12-17 DIAGNOSIS — Z87891 Personal history of nicotine dependence: Secondary | ICD-10-CM

## 2021-12-17 DIAGNOSIS — J449 Chronic obstructive pulmonary disease, unspecified: Secondary | ICD-10-CM | POA: Diagnosis not present

## 2021-12-17 DIAGNOSIS — R1310 Dysphagia, unspecified: Secondary | ICD-10-CM

## 2021-12-17 DIAGNOSIS — R569 Unspecified convulsions: Secondary | ICD-10-CM

## 2021-12-17 DIAGNOSIS — G919 Hydrocephalus, unspecified: Secondary | ICD-10-CM

## 2021-12-17 DIAGNOSIS — Z9181 History of falling: Secondary | ICD-10-CM

## 2021-12-17 DIAGNOSIS — Z9981 Dependence on supplemental oxygen: Secondary | ICD-10-CM

## 2021-12-17 DIAGNOSIS — I10 Essential (primary) hypertension: Secondary | ICD-10-CM | POA: Diagnosis not present

## 2021-12-17 DIAGNOSIS — Z7951 Long term (current) use of inhaled steroids: Secondary | ICD-10-CM

## 2021-12-17 DIAGNOSIS — Z7902 Long term (current) use of antithrombotics/antiplatelets: Secondary | ICD-10-CM

## 2021-12-17 DIAGNOSIS — F32A Depression, unspecified: Secondary | ICD-10-CM | POA: Diagnosis not present

## 2021-12-17 DIAGNOSIS — Z982 Presence of cerebrospinal fluid drainage device: Secondary | ICD-10-CM

## 2021-12-17 DIAGNOSIS — N390 Urinary tract infection, site not specified: Secondary | ICD-10-CM | POA: Diagnosis not present

## 2021-12-17 DIAGNOSIS — E119 Type 2 diabetes mellitus without complications: Secondary | ICD-10-CM | POA: Diagnosis not present

## 2021-12-17 DIAGNOSIS — F03B3 Unspecified dementia, moderate, with mood disturbance: Secondary | ICD-10-CM | POA: Diagnosis not present

## 2021-12-17 DIAGNOSIS — Z8673 Personal history of transient ischemic attack (TIA), and cerebral infarction without residual deficits: Secondary | ICD-10-CM

## 2021-12-17 DIAGNOSIS — F03B4 Unspecified dementia, moderate, with anxiety: Secondary | ICD-10-CM | POA: Diagnosis not present

## 2021-12-17 DIAGNOSIS — G2401 Drug induced subacute dyskinesia: Secondary | ICD-10-CM

## 2021-12-18 ENCOUNTER — Telehealth: Payer: Medicare PPO

## 2021-12-18 ENCOUNTER — Telehealth: Payer: Self-pay | Admitting: *Deleted

## 2021-12-18 NOTE — Telephone Encounter (Signed)
°  Care Management   Follow Up Note   12/18/2021 Name: Amanda Porter MRN: [id number] DOB: 1946-08-31   Referred by: Pleas Koch, NP Reason for referral : No chief complaint on file.   An unsuccessful telephone outreach was attempted today. The patient was referred to the case management team for assistance with care management and care coordination.   Follow Up Plan: The care management team will reach out to the patient again over the next 10 days.    Eduard Clos MSW, LCSW Licensed Clinical Social Worker [place]   [phone]

## 2021-12-20 ENCOUNTER — Telehealth: Payer: Self-pay | Admitting: Primary Care

## 2021-12-20 ENCOUNTER — Telehealth: Payer: Self-pay

## 2021-12-20 ENCOUNTER — Telehealth: Payer: Self-pay | Admitting: *Deleted

## 2021-12-20 NOTE — Telephone Encounter (Signed)
Attempted to contact patient's daughter [name] to schedule a Palliative Care consult appointment. No answer left a message to return call.

## 2021-12-20 NOTE — Telephone Encounter (Signed)
[facility] [phone] (secure line) Please lvm   Requesting verbal orders to proceed with PT for this patient  1 wk 7 wks  COPD , MD requested PT at home to help with walking, balance, pt also short of breath

## 2021-12-20 NOTE — Telephone Encounter (Signed)
Approved.  

## 2021-12-20 NOTE — Telephone Encounter (Signed)
Left VM giving the VO

## 2021-12-20 NOTE — Chronic Care Management (AMB) (Signed)
°  Care Management   Note  12/20/2021 Name: Amanda Porter MRN: [id number] DOB: May 24, 1946  Amanda Porter is a 76 y.o. year old female who is a primary care patient of Pleas Koch, NP and is actively engaged with the care management team. I reached out to [place] by phone today to assist with re-scheduling an initial visit with the Licensed Clinical Social Worker  Follow up plan: Unsuccessful telephone outreach attempt made. A HIPAA compliant phone message was left for the patient providing contact information and requesting a return call.   Julian Hy, [place] Management  Direct Dial: [phone]

## 2021-12-20 NOTE — Chronic Care Management (AMB) (Signed)
°  Care Management   Note  12/20/2021 Name: PURVI RUEHL MRN: [id number] DOB: 27-Aug-1946  [name] is a 76 y.o. year old female who is a primary care patient of Pleas Koch, NP and is actively engaged with the care management team. I reached out to [place] by phone today to assist with re-scheduling an initial visit with the Licensed Clinical Social Worker  Follow up plan: Telephone appointment with care management team member scheduled for: 12/27/2021  Julian Hy, [place], [place] Management  Direct Dial: [phone]

## 2021-12-20 NOTE — Telephone Encounter (Signed)
Spoke with patient's daughter [name] and scheduled a Televisit Palliative Consult for 12/23/21 @ 4PM.  Consent obtained; updated Outlook/Netsmart/Team List and Epic.

## 2021-12-23 ENCOUNTER — Other Ambulatory Visit: Payer: Medicare PPO | Admitting: Hospice

## 2021-12-23 ENCOUNTER — Other Ambulatory Visit: Payer: Self-pay

## 2021-12-23 DIAGNOSIS — F0390 Unspecified dementia without behavioral disturbance: Secondary | ICD-10-CM

## 2021-12-23 DIAGNOSIS — Z515 Encounter for palliative care: Secondary | ICD-10-CM

## 2021-12-23 DIAGNOSIS — R531 Weakness: Secondary | ICD-10-CM

## 2021-12-23 DIAGNOSIS — E43 Unspecified severe protein-calorie malnutrition: Secondary | ICD-10-CM

## 2021-12-23 DIAGNOSIS — R0602 Shortness of breath: Secondary | ICD-10-CM

## 2021-12-23 NOTE — Progress Notes (Addendum)
[facility] Consult Note Telephone: [phone]  Fax: [phone]  PATIENT NAME: Amanda Porter DOB: [birth year] MRN: [id number]  PRIMARY CARE PROVIDER:   Pleas Koch, NP Pleas Koch, NP [place],  [place] [address]  REFERRING PROVIDER: Pleas Koch, NP Pleas Koch, NP [place],  [place] [address]  RESPONSIBLE PARTYCaryl Pina [phone] - paramedic Key pad code from [place] from 12pm Contact Information     Name Relation Home Work Mobile   [place] Daughter [phone]     [name] [phone]  [phone]   [name] [phone]  [phone]      TELEHEALTH VISIT STATEMENT Due to the COVID-19 crisis, this visit was done via telemedicine from my office and it was initiated and consent by this patient and or family.  I connected with patient OR PROXY by a telephone/video  and verified that I am speaking with the correct person. I discussed the limitations of evaluation and management by telemedicine. The patient expressed understanding and agreed to proceed. Palliative Care was asked to follow this patient to address advance care planning, complex medical decision making and goals of care clarification. This is the initial visit  Visit is to build trust and highlight Palliative Medicine as specialized medical care for people living with serious illness, aimed at facilitating better quality of life through symptoms relief, assisting with advance care planning and complex medical decision making. This is a follow up visit.  RECOMMENDATIONS/PLAN:   Advance Care Planning/Code Status: Patient is a Full code.   Goals of Care: Goals of care include to maximize quality of life and symptom management.  Visit consisted of counseling and education dealing with the complex and emotionally intense issues of symptom management and palliative care in the setting of  serious and potentially life-threatening illness. Palliative care team will continue to support patient, patient's family, and medical team.  Symptom Management/Plan:   Dementia- Ongoing memory loss/confusion, incontinent of urine, FAST 6C. Impoverished thoughts, impaired judgement, word-finding difficulty. Encourage reminiscence, word search/puzzles, cueing for recollection.  Promote calm approach and engaging environment; redirection as needed.    Shortness of breath-secondary to COPD, recent pneumonia and flu. Completed treatments. Continue wearing oxygen continuously at 3 LPM. Continue inhalers/breathing treatments as ordered. Encouraged slow deep breathing. Patient is followed by Pulmonologist.   Protein calorie malnutrition- Offer 4-6 small meals. Glucerna BID as ordered. Monitor for ongoing weight loss. Current weight is 108 Ibs Height 5 feet 2 inches   Weakness-continue PT/OT as ordered. Monitor for falls/safety.   Seizures-no seizure activity reported since hospitalization. Continue Keppra as directed. Follow up with neurology as scheduled.    Follow up Palliative Care Visit: Palliative care will continue to follow for complex medical decision making, advance care planning, and clarification of goals. Return in 6-8 weeks or prn.    PPS: 50%   HOSPICE ELIGIBILITY/DIAGNOSIS: TBD   Chief Complaint: Palliative Medicine follow up visit    HISTORY OF PRESENT ILLNESS:  Amanda Porter a 76 y.o. female with multiple medical problems including protein calorie malnutrition, dementia, GTC seizures, COPD, type 2 diabetes, hyperlipidemia, anemia of chronic disease, iron deficiency anemia, non displaced ulnar styloid fracture, left wrist,  left upper lobe lobectomy for lung cancer in 2000. History of Arthritis, Left shoulder pain, hydrocephalus with shunt placed initially 2018. History  obtained from review of EMR, discussion with primary team, family and/or patient. Records reviewed and summarized  above. All 10 point systems reviewed and are negative except as documented in history of present illness above  Review and summarization of Epic records shows history from other than patient.   Palliative Care was asked to follow this patient o help address complex decision making in the context of advance care planning and goals of care clarification. I reviewed, as needed, available labs, patient records, imaging, studies and related documents from the EMR.    PERTINENT MEDICATIONS:  Outpatient Encounter Medications as of 12/23/2021  Medication Sig   albuterol (PROVENTIL) (2.5 MG/3ML) 0.083% nebulizer solution USE 1 VIAL IN NEBULIZER EVERY 6 HOURS   albuterol (VENTOLIN HFA) 108 (90 Base) MCG/ACT inhaler INHALE 2 PUFFS BY MOUTH EVERY 4 HOURS AS NEEDED   alendronate (FOSAMAX) 70 MG tablet Take with a full glass of water on an empty stomach for bone density.   ASHWAGANDHA PO Take 1 capsule by mouth daily as needed (anxiety).   azithromycin (ZITHROMAX) 250 MG tablet Take 2 tablets by mouth today, then 1 tablet daily for 4 additional days.   blood glucose meter kit and supplies KIT Dispense based on patient and insurance preference. Use up to four times daily as directed. (FOR ICD-9 250.00, 250.01).   busPIRone (BUSPAR) 15 MG tablet 3  qam   2 qpm   CALCIUM PO Take 1 capsule by mouth daily.   clopidogrel (PLAVIX) 75 MG tablet TAKE 1 TABLET BY MOUTH EVERY DAY   Ferrous Sulfate (IRON PO) Take by mouth.   folic acid (FOLVITE) 1 MG tablet Take 1 tablet (1 mg total) by mouth daily.   glipiZIDE (GLUCOTROL XL) 2.5 MG 24 hr tablet Take 1 tablet (2.5 mg total) by mouth daily with breakfast. For diabetes.   guaiFENesin (MUCINEX) 600 MG 12 hr tablet Take 1 tablet (600 mg total) by mouth 2 (two) times daily.   LORazepam (ATIVAN) 0.5 MG tablet 1  q am   Multiple Vitamin (MULTIVITAMIN) capsule Take 1 capsule by mouth daily.   oseltamivir (TAMIFLU) 75 MG capsule Take 1 capsule (75 mg total) by mouth 2 (two)  times daily.   OXYGEN 3 L continuous.    potassium chloride SA (KLOR-CON M) 20 MEQ tablet Take 1 tablet (20 mEq total) by mouth 2 (two) times daily. For low potassium.   predniSONE (DELTASONE) 20 MG tablet Take 2 tablets by mouth once daily for 5 days.   rosuvastatin (CRESTOR) 20 MG tablet TAKE 1 TABLET (20 MG TOTAL) BY MOUTH EVERY EVENING. FOR CHOLESTEROL.   tamsulosin (FLOMAX) 0.4 MG CAPS capsule Take 1 capsule (0.4 mg total) by mouth daily. For urine retention   Tiotropium Bromide-Olodaterol (STIOLTO RESPIMAT) 2.5-2.5 MCG/ACT AERS Inhale 2 puffs into the lungs daily.   vitamin B-12 1000 MCG tablet Take 1 tablet (1,000 mcg total) by mouth daily.   No facility-administered encounter medications on file as of 12/23/2021.    HOSPICE ELIGIBILITY/DIAGNOSIS: TBD  PAST MEDICAL HISTORY:  Past Medical History:  Diagnosis Date   Acute respiratory failure requiring reintubation ([place]) 11/05/2021   Anxiety    Anxiety and depression    COPD (chronic obstructive pulmonary disease) ([facility])    CVA (cerebral vascular accident) ([place])    CVA (cerebral vascular accident) ([place])    L sided deficits   Dementia ([place])    Depression    Diabetes mellitus ([place])    Type 2   Diabetes mellitus without complication ([place])    Dysphagia    Dysphasia    History of lung cancer  Hyperlipidemia    Memory disturbance 11/20/2017   Normal pressure hydrocephalus ([place]) 2018   Retinal detachment    Right   Right carotid bruit 11/20/2017   Tardive dyskinesia 02/01/2020   Tardive dyskinesia    Thrombosis    Arterial to lower extremity?      ALLERGIES:  Allergies  Allergen Reactions   Lexapro [Escitalopram Oxalate] Other (See Comments)    Dizziness, nausea, fatigue   Lexapro [Escitalopram]    Neosporin  [Neomycin-Polymyxin-Gramicidin] Rash    (Neosporin)   Neosporin [Bacitracin-Polymyxin B] Rash      I spent  40 minutes providing this consultation; this includes time spent with patient/family, chart review and  documentation. More than 50% of the time in this consultation was spent on counseling and coordinating communication   Thank you for the opportunity to participate in the care of [place] Please call our office at [phone] if we can be of additional assistance.  Note: Portions of this note were generated with [employer]. Dictation errors may occur despite best attempts at proofreading.  Teodoro Spray, NP

## 2021-12-25 ENCOUNTER — Other Ambulatory Visit: Payer: Self-pay | Admitting: Internal Medicine

## 2021-12-26 DIAGNOSIS — J449 Chronic obstructive pulmonary disease, unspecified: Secondary | ICD-10-CM | POA: Diagnosis not present

## 2021-12-26 NOTE — Telephone Encounter (Signed)
Stiolto refilled

## 2021-12-27 ENCOUNTER — Ambulatory Visit (INDEPENDENT_AMBULATORY_CARE_PROVIDER_SITE_OTHER): Payer: Medicare PPO | Admitting: *Deleted

## 2021-12-27 ENCOUNTER — Telehealth: Payer: Self-pay

## 2021-12-27 DIAGNOSIS — F0390 Unspecified dementia without behavioral disturbance: Secondary | ICD-10-CM

## 2021-12-27 DIAGNOSIS — J449 Chronic obstructive pulmonary disease, unspecified: Secondary | ICD-10-CM

## 2021-12-27 DIAGNOSIS — Z8673 Personal history of transient ischemic attack (TIA), and cerebral infarction without residual deficits: Secondary | ICD-10-CM

## 2021-12-27 NOTE — Chronic Care Management (AMB) (Signed)
Chronic Care Management    Clinical Social Work Note  12/27/2021 Name: Amanda Porter MRN: [id number] DOB: 01/20/1946  Amanda Porter is a 76 y.o. year old female who is a primary care patient of Pleas Koch, NP. The CCM team was consulted to assist the patient with chronic disease management and/or care coordination needs related to: [employer] , Level of Care Concerns, and Caregiver Stress.   Engaged with patient by telephone for initial visit in response to provider referral for social work chronic care management and care coordination services.   Consent to Services:  The patient was given information about Chronic Care Management services, agreed to services, and gave verbal consent prior to initiation of services.  Please see initial visit note for detailed documentation.   Patient agreed to services and consent obtained.   Assessment: Review of patient past medical history, allergies, medications, and health status, including review of relevant consultants reports was performed today as part of a comprehensive evaluation and provision of chronic care management and care coordination services.     SDOH (Social Determinants of Health) assessments and interventions performed:  SDOH Interventions    Flowsheet Row Most Recent Value  SDOH Interventions   Food Insecurity Interventions Intervention Not Indicated  Transportation Interventions Intervention Not Indicated        Advanced Directives Status: See Care Plan for related entries.  CCM Care Plan  Allergies  Allergen Reactions   Lexapro [Escitalopram Oxalate] Other (See Comments)    Dizziness, nausea, fatigue   Lexapro [Escitalopram]    Neosporin  [Neomycin-Polymyxin-Gramicidin] Rash    (Neosporin)   Neosporin [Bacitracin-Polymyxin B] Rash    Outpatient Encounter Medications as of 12/27/2021  Medication Sig   albuterol (PROVENTIL) (2.5 MG/3ML) 0.083% nebulizer solution USE 1 VIAL IN NEBULIZER EVERY 6 HOURS    albuterol (VENTOLIN HFA) 108 (90 Base) MCG/ACT inhaler INHALE 2 PUFFS BY MOUTH EVERY 4 HOURS AS NEEDED   alendronate (FOSAMAX) 70 MG tablet Take with a full glass of water on an empty stomach for bone density.   ASHWAGANDHA PO Take 1 capsule by mouth daily as needed (anxiety).   azithromycin (ZITHROMAX) 250 MG tablet Take 2 tablets by mouth today, then 1 tablet daily for 4 additional days.   blood glucose meter kit and supplies KIT Dispense based on patient and insurance preference. Use up to four times daily as directed. (FOR ICD-9 250.00, 250.01).   busPIRone (BUSPAR) 15 MG tablet 3  qam   2 qpm   CALCIUM PO Take 1 capsule by mouth daily.   clopidogrel (PLAVIX) 75 MG tablet TAKE 1 TABLET BY MOUTH EVERY DAY   Ferrous Sulfate (IRON PO) Take by mouth.   folic acid (FOLVITE) 1 MG tablet Take 1 tablet (1 mg total) by mouth daily.   glipiZIDE (GLUCOTROL XL) 2.5 MG 24 hr tablet Take 1 tablet (2.5 mg total) by mouth daily with breakfast. For diabetes.   guaiFENesin (MUCINEX) 600 MG 12 hr tablet Take 1 tablet (600 mg total) by mouth 2 (two) times daily.   LORazepam (ATIVAN) 0.5 MG tablet 1  q am   Multiple Vitamin (MULTIVITAMIN) capsule Take 1 capsule by mouth daily.   oseltamivir (TAMIFLU) 75 MG capsule Take 1 capsule (75 mg total) by mouth 2 (two) times daily.   OXYGEN 3 L continuous.    potassium chloride SA (KLOR-CON M) 20 MEQ tablet Take 1 tablet (20 mEq total) by mouth 2 (two) times daily. For low potassium.   predniSONE (  DELTASONE) 20 MG tablet Take 2 tablets by mouth once daily for 5 days.   rosuvastatin (CRESTOR) 20 MG tablet TAKE 1 TABLET (20 MG TOTAL) BY MOUTH EVERY EVENING. FOR CHOLESTEROL.   tamsulosin (FLOMAX) 0.4 MG CAPS capsule Take 1 capsule (0.4 mg total) by mouth daily. For urine retention   Tiotropium Bromide-Olodaterol (STIOLTO RESPIMAT) 2.5-2.5 MCG/ACT AERS INHALE 2 PUFFS BY MOUTH INTO THE LUNGS DAILY   vitamin B-12 1000 MCG tablet Take 1 tablet (1,000 mcg total) by mouth  daily.   No facility-administered encounter medications on file as of 12/27/2021.    Patient Active Problem List   Diagnosis Date Noted   Influenza A 12/13/2021   Urinary retention 12/13/2021   Wrist fracture, closed, left, sequela 12/13/2021   Protein-calorie malnutrition, severe 11/07/2021   Tardive dyskinesia 02/01/2020   Preventative health care 01/24/2020   Lightheadedness 09/05/2019   Cervical spondylolysis 05/24/2019   Chronic respiratory failure with hypoxia ([place]) 02/25/2019   Postural dizziness 08/11/2018   Iron deficiency anemia 07/02/2018   Anemia 04/12/2018   Type 2 diabetes mellitus without complication, without long-term current use of insulin ([place]) 02/18/2018   Pain of left lower extremity 11/26/2017   Memory disturbance 11/20/2017   Right carotid bruit 11/20/2017   Hyperlipidemia 09/14/2017   COPD mixed type ([place]) 09/14/2017   Dementia without behavioral disturbance ([place]) 09/14/2017   Normal pressure hydrocephalus ([place]) 09/14/2017   Dysphagia 09/14/2017   Essential hypertension 09/14/2017   History of CVA (cerebrovascular accident) 09/14/2017   Anxiety and depression 09/14/2017   Tobacco use disorder 12/10/2016    Conditions to be addressed/monitored: Dementia; Financial constraints related to  , Level of care concerns, Social Isolation, Limited access to caregiver, and Cognitive Deficits  Care Plan : LCSW Plan of Care  Updates made by Deirdre Peer, LCSW since 12/27/2021 12:00 AM     Problem: Social and Functional Symptoms with Dementia and Physical impairments   Priority: High     Long-Range Goal: Provide resources, support and guidance to optimize social and functional skills   Start Date: 12/27/2021  Expected End Date: 03/15/2022  This Visit's Progress: On track  Priority: High  Note:   Current Barriers:  Financial constraints related to limited income and care needs, Level of care concerns, ADL IADL limitations, Limited access to caregiver,  Cognitive Deficits, and Lacks knowledge of community resource:    Care Coordination needs related to Dementia: per daughter, mild dementia and s/p CVA Level of Care Concerns: in-home support/care, ALF, etc Cognitive Deficits Lacks knowledge of how to connect   CSW Clinical Goal(s):  Caregiver  and POA  explore community resource options discussed during encounter for unmet needs related [id number] constraints related to limited income and care needs/cost, Level of care concerns, ADL IADL limitations, Social Isolation, Limited access to caregiver, Cognitive Deficits, Inability to perform ADL's independently, and Lacks knowledge of community resource:    will work with Education officer, museum to address concerns related to current and long term care needs  through collaboration with [employer] representative, provider, and care team.   Interventions:  CSW spoke with pt's daughter, CARINA, [place]. Pt resides with her and she works outside of the home; up to 10-12 hours day.  Pt has mild dementia and is s/p CVA with mobility issues-uses a rolator. Per daughter, pt was in hospital in December going to [facility] for 13 days and then home. She currently has [facility] providing RN, PT and OT visits once weekly.  Daughter is concerned about her being home alone so much and is interested in seeking help. Pt does not have and is not eligible for Medicaid, LTC Insurance, etc. Advised daughter that in home care would have to be paid out of pocket and this may be challenging due to pt's limited income. Daughter planning to speak with [employer] Navigator for [employer]. CSW has made her aware of Adult Day Care programs, Meals on Wheels, life alert devices, etc and will have Care Guide follow up as well.   1:1 collaboration with primary care provider regarding development and update of comprehensive plan of care as evidenced by provider attestation and co-signature Inter-disciplinary care team  collaboration (see longitudinal plan of care) Evaluation of current treatment plan related to  self management and patient's adherence to plan as established by provider Review resources, discussed options and provided patient information about  Health insurance /pay options ([employer] plan benefits ) Private pay options for personal care needs   DSS in-home aide program:  Services provided by [facility]   Assisted Living options       Patient Self-Care Activities:  -expect call from [employer] regarding resources - begin a notebook of services in my neighborhood or community - call 211 when I need some help - follow-up on any referrals for help I am given - think ahead to make sure my need does not become an emergency - make a note about what I need to have by the phone or take with me, like an identification card or social security number have a back-up plan - make a list of family or friends that I can call      Follow Up Plan: Appointment scheduled for SW follow up with client by phone on: 01/24/22      Eduard Clos MSW, [place] Licensed Clinical Social Worker [place]   [phone]

## 2021-12-27 NOTE — Patient Instructions (Signed)
Visit Information   Thank you for taking time to visit with me today. Please don't hesitate to contact me if I can be of assistance to you before our next scheduled telephone appointment.  Following are the goals we discussed today:   -expect call from [place] regarding resources - begin a notebook of services in my neighborhood or community - call 211 when I need some help - follow-up on any referrals for help I am given - think ahead to make sure my need does not become an emergency - make a note about what I need to have by the phone or take with me, like an identification card or social security number have a back-up plan - make a list of family or friends that I can call     Our next appointment is by telephone on 01/24/22 at 10:30  Please call the care guide team at [phone] if you need to cancel or reschedule your appointment.   If you are experiencing a Mental Health or [place] or need someone to talk to, please call the [place] National Suicide Prevention Lifeline: 9096859199 or TTY: [phone] TTY [phone]) to talk to a trained counselor call 911   Following is a copy of your full care plan:  Care Plan : LCSW Plan of Care  Updates made by Deirdre Peer, LCSW since 12/27/2021 12:00 AM     Problem: Social and Functional Symptoms with Dementia and Physical impairments   Priority: High     Long-Range Goal: Provide resources, support and guidance to optimize social and functional skills   Start Date: 12/27/2021  Expected End Date: 03/15/2022  This Visit's Progress: On track  Priority: High  Note:   Current Barriers:  Financial constraints related to limited income and care needs, Level of care concerns, ADL IADL limitations, Limited access to caregiver, Cognitive Deficits, and Lacks knowledge of community resource:    Care Coordination needs related to Dementia: per daughter, mild dementia and s/p CVA Level of Care  Concerns: in-home support/care, ALF, etc Cognitive Deficits Lacks knowledge of how to connect   CSW Clinical Goal(s):  Caregiver  and POA  explore community resource options discussed during encounter for unmet needs related FS:ELTRVUYEB constraints related to limited income and care needs/cost, Level of care concerns, ADL IADL limitations, Social Isolation, Limited access to caregiver, Cognitive Deficits, Inability to perform ADL's independently, and Lacks knowledge of community resource:  will work with Education officer, museum to address concerns related to current and long term care needs  through collaboration with [employer] representative, provider, and care team.   Interventions:  CSW spoke with pt's daughter, HAYLE, [place]. Pt resides with her and she works outside of the home; up to 10-12 hours day.  Pt has mild dementia and is s/p CVA with mobility issues-uses a rolator. Per daughter, pt was in hospital in December going to [facility] for 13 days and then home. She currently has [facility] providing RN, PT and OT visits once weekly.  Daughter is concerned about her being home alone so much and is interested in seeking help. Pt does not have and is not eligible for Medicaid, LTC Insurance, etc. Advised daughter that in home care would have to be paid out of pocket and this may be challenging due to pt's limited income. Daughter planning to speak with [employer] Navigator for [employer]. CSW has made her aware of Adult Day Care programs, Meals on Wheels,  life alert devices, etc and will have Care Guide follow up as well.   1:1 collaboration with primary care provider regarding development and update of comprehensive plan of care as evidenced by provider attestation and co-signature Inter-disciplinary care team collaboration (see longitudinal plan of care) Evaluation of current treatment plan related to  self management and patient's adherence to plan as established by  provider Review resources, discussed options and provided patient information about  Health insurance /pay options ([employer] plan benefits ) Private pay options for personal care needs   DSS in-home aide program:  Services provided by [facility]   Assisted Living options       Patient Self-Care Activities:  -expect call from [employer] regarding resources - begin a notebook of services in my neighborhood or community - call 211 when I need some help - follow-up on any referrals for help I am given - think ahead to make sure my need does not become an emergency - make a note about what I need to have by the phone or take with me, like an identification card or social security number have a back-up plan - make a list of family or friends that I can call          Consent to CCM Services: Ms. Halle was given information about Chronic Care Management services including:  CCM service includes personalized support from designated clinical staff supervised by her physician, including individualized plan of care and coordination with other care providers 24/7 contact phone numbers for assistance for urgent and routine care needs. Service will only be billed when office clinical staff spend 20 minutes or more in a month to coordinate care. Only one practitioner may furnish and bill the service in a calendar month. The patient may stop CCM services at any time (effective at the end of the month) by phone call to the office staff. The patient will be responsible for cost sharing (co-pay) of up to 20% of the service fee (after annual deductible is met).  Patient agreed to services and verbal consent obtained.   The patient verbalized understanding of instructions, educational materials, and care plan provided today and declined offer to receive copy of patient instructions, educational materials, and care plan.   Telephone follow up appointment with care management team  member scheduled for: 01/24/22

## 2021-12-27 NOTE — Telephone Encounter (Signed)
° °  Telephone encounter was:  Unsuccessful.  12/27/2021 Name: Amanda Porter MRN: [id number] DOB: 08/01/1946  Unsuccessful outbound call made today to assist with:  Senior resources and Meals on [employer] Attempt:  1st Attempt  A HIPAA compliant voice message was left requesting a return call.  Instructed patient to call back. I called the Patient and daughter, no answer I left a message to call back at earliest convenience.    [facility], Care Management  [phone] [address], [place], [place] [address] Phone: [phone] Email: [email or web address]

## 2021-12-31 ENCOUNTER — Telehealth: Payer: Self-pay

## 2021-12-31 DIAGNOSIS — F0390 Unspecified dementia without behavioral disturbance: Secondary | ICD-10-CM

## 2021-12-31 DIAGNOSIS — J449 Chronic obstructive pulmonary disease, unspecified: Secondary | ICD-10-CM

## 2021-12-31 NOTE — Telephone Encounter (Signed)
° °  Telephone encounter was:  Unsuccessful.  12/31/2021 Name: Amanda Porter MRN: [id number] DOB: [birth year]  Unsuccessful outbound call made today to assist with:  Community resources , MEALS ON WHEELS, adult daycare,senior programs  Outreach Attempt:  2nd Attempt  A HIPAA compliant voice message was left requesting a return call.  Instructed patient to call back at  earliest convenience.    [facility], Care Management  [phone] [address], [place],  [address] Phone: [phone] Email: [email or web address]

## 2022-01-01 ENCOUNTER — Telehealth: Payer: Self-pay

## 2022-01-01 NOTE — Telephone Encounter (Signed)
° °  Telephone encounter was:  Unsuccessful.  01/01/2022 Name: Amanda Porter MRN: [id number] DOB: 1946/06/01  Unsuccessful outbound call made today to assist with:  [employer] ,MOW ,adult day care, private care and senior center    [employer] Attempt:  3rd Attempt.  Referral closed unable to contact patient.  A HIPAA compliant voice message was left requesting a return call.  Instructed patient to call back at  earliest convenience.    [facility], Care Management  [phone] [address], [place],  [address] Phone: [phone] Email: [email or web address]

## 2022-01-03 DIAGNOSIS — Z982 Presence of cerebrospinal fluid drainage device: Secondary | ICD-10-CM | POA: Diagnosis not present

## 2022-01-10 ENCOUNTER — Encounter: Payer: Self-pay | Admitting: Nurse Practitioner

## 2022-01-10 ENCOUNTER — Other Ambulatory Visit: Payer: Self-pay

## 2022-01-10 ENCOUNTER — Telehealth (INDEPENDENT_AMBULATORY_CARE_PROVIDER_SITE_OTHER): Payer: Medicare PPO | Admitting: Nurse Practitioner

## 2022-01-10 DIAGNOSIS — U071 COVID-19: Secondary | ICD-10-CM | POA: Insufficient documentation

## 2022-01-10 MED ORDER — MOLNUPIRAVIR EUA 200MG CAPSULE
4.0000 | ORAL_CAPSULE | Freq: Two times a day (BID) | ORAL | 0 refills | Status: AC
Start: 2022-01-10 — End: 2022-01-15

## 2022-01-10 NOTE — Assessment & Plan Note (Signed)
Patient's age comorbidities and lung disease patient needs to be treated with antiviral.  Did discuss that antivirals are EUA only.  Given patient's medication list she is eligible to use molnupiravir.  Did discuss signs and symptoms when to seek urgent or emergent health care.  Also discussed CDC guidelines in regards to quarantine.  Currently do not have a pulse ox machine at home but they plan on getting 1.  Patient currently wears 3 L of oxygen all the time.  Start molnupiravir soon as possible.  Patient will require close watch

## 2022-01-10 NOTE — Telephone Encounter (Signed)
Spoke with patient's daughter. See office visit note.

## 2022-01-10 NOTE — Progress Notes (Signed)
Patient ID: Amanda Porter, female    DOB: [birth year], 76 y.o.   MRN: [id number]  Virtual visit completed through [facility], a video enabled telemedicine application. Due to national recommendations of social distancing due to COVID-19, a virtual visit is felt to be most appropriate for this patient at this time. Reviewed limitations, risks, security and privacy concerns of performing a virtual visit and the availability of in person appointments. I also reviewed that there may be a patient responsible charge related to this service. The patient agreed to proceed.   Patient location: home Provider location: [place] [facility], office Persons participating in this virtual visit: patient, provider, daughter   If any vitals were documented, they were collected by patient at home unless specified below.    There were no vitals taken for this visit.   CC: Covid 19 Subjective:   HPI: Amanda Porter is a 76 y.o. female presenting on 01/10/2022 for Covid Positive (On 01/09/22, sx started on 01/09/22, headache, fatigue, congestion, SOB. No fever. Has been taking Mucinex and Tylenol.)   Symptoms started yesterday Covid test yesterday, that was positive   Pfizer x2 and one booster Daughter has covid , dxTuesday. They live together Mucinex and tylenol and helped some Oxygen 3 L all the time   Relevant past medical, surgical, family and social history reviewed and updated as indicated. Interim medical history since our last visit reviewed. Allergies and medications reviewed and updated. Outpatient Medications Prior to Visit  Medication Sig Dispense Refill   albuterol (PROVENTIL) (2.5 MG/3ML) 0.083% nebulizer solution USE 1 VIAL IN NEBULIZER EVERY 6 HOURS 360 mL 11   albuterol (VENTOLIN HFA) 108 (90 Base) MCG/ACT inhaler INHALE 2 PUFFS BY MOUTH EVERY 4 HOURS AS NEEDED 18 g 12   alendronate (FOSAMAX) 70 MG tablet Take with a full glass of water on an empty stomach for bone density. 12 tablet 3    ASHWAGANDHA PO Take 1 capsule by mouth daily as needed (anxiety).     blood glucose meter kit and supplies KIT Dispense based on patient and insurance preference. Use up to four times daily as directed. (FOR ICD-9 250.00, 250.01). 1 each 0   busPIRone (BUSPAR) 15 MG tablet 3  qam   2 qpm 150 tablet 4   CALCIUM PO Take 1 capsule by mouth daily.     clopidogrel (PLAVIX) 75 MG tablet TAKE 1 TABLET BY MOUTH EVERY DAY 90 tablet 3   Ferrous Sulfate (IRON PO) Take by mouth.     folic acid (FOLVITE) 1 MG tablet Take 1 tablet (1 mg total) by mouth daily. 90 tablet 3   glipiZIDE (GLUCOTROL XL) 2.5 MG 24 hr tablet Take 1 tablet (2.5 mg total) by mouth daily with breakfast. For diabetes. 90 tablet 3   guaiFENesin (MUCINEX) 600 MG 12 hr tablet Take 1 tablet (600 mg total) by mouth 2 (two) times daily.     LORazepam (ATIVAN) 0.5 MG tablet 1  q am 30 tablet 4   Multiple Vitamin (MULTIVITAMIN) capsule Take 1 capsule by mouth daily.     OXYGEN 3 L continuous.      rosuvastatin (CRESTOR) 20 MG tablet TAKE 1 TABLET (20 MG TOTAL) BY MOUTH EVERY EVENING. FOR CHOLESTEROL. 90 tablet 3   tamsulosin (FLOMAX) 0.4 MG CAPS capsule Take 1 capsule (0.4 mg total) by mouth daily. For urine retention 90 capsule 1   Tiotropium Bromide-Olodaterol (STIOLTO RESPIMAT) 2.5-2.5 MCG/ACT AERS INHALE 2 PUFFS BY MOUTH INTO THE LUNGS  DAILY 12 g 4   vitamin B-12 1000 MCG tablet Take 1 tablet (1,000 mcg total) by mouth daily.     azithromycin (ZITHROMAX) 250 MG tablet Take 2 tablets by mouth today, then 1 tablet daily for 4 additional days. 6 tablet 0   oseltamivir (TAMIFLU) 75 MG capsule Take 1 capsule (75 mg total) by mouth 2 (two) times daily. 10 capsule 0   potassium chloride SA (KLOR-CON M) 20 MEQ tablet Take 1 tablet (20 mEq total) by mouth 2 (two) times daily. For low potassium. 10 tablet 0   predniSONE (DELTASONE) 20 MG tablet Take 2 tablets by mouth once daily for 5 days. 10 tablet 0   No facility-administered medications prior to  visit.     Per HPI unless specifically indicated in ROS section below Review of Systems  Constitutional:  Positive for fatigue. Negative for appetite change, chills and fever.  HENT:  Positive for congestion and sinus pressure. Negative for ear discharge, ear pain, sinus pain and sore throat.   Respiratory:  Positive for cough (yellow/brown) and shortness of breath.   Cardiovascular:  Negative for chest pain.  Gastrointestinal:  Negative for abdominal pain, diarrhea, nausea and vomiting.  Musculoskeletal:  Negative for arthralgias and joint swelling.  Neurological:  Positive for headaches. Negative for dizziness and light-headedness.  Objective:  There were no vitals taken for this visit.  Wt Readings from Last 3 Encounters:  12/13/21 108 lb (49 kg)  11/12/21 107 lb 9.4 oz (48.8 kg)  07/03/21 115 lb 12.8 oz (52.5 kg)       Physical exam: Gen: alert, NAD, not ill appearing Pulm: speaks in complete sentences without increased work of breathing Psych: normal mood, normal thought content      Results for orders placed or performed in visit on 95/18/84  Basic metabolic panel  Result Value Ref Range   Sodium 140 135 - 145 mEq/L   Potassium 3.4 (L) 3.5 - 5.1 mEq/L   Chloride 98 96 - 112 mEq/L   CO2 33 (H) 19 - 32 mEq/L   Glucose, Bld 191 (H) 70 - 99 mg/dL   BUN 16 6 - 23 mg/dL   Creatinine, Ser 0.69 0.40 - 1.20 mg/dL   GFR 84.09 >60.00 mL/min   Calcium 9.1 8.4 - 10.5 mg/dL  CBC with Differential/Platelet  Result Value Ref Range   WBC 7.4 4.0 - 10.5 K/uL   RBC 3.54 (L) 3.87 - 5.11 Mil/uL   Hemoglobin 10.2 (L) 12.0 - 15.0 g/dL   HCT 32.7 (L) 36.0 - 46.0 %   MCV 92.4 78.0 - 100.0 fl   MCHC 31.1 30.0 - 36.0 g/dL   RDW 17.3 (H) 11.5 - 15.5 %   Platelets 235.0 150.0 - 400.0 K/uL   Neutrophils Relative % 77.4 (H) 43.0 - 77.0 %   Lymphocytes Relative 8.3 (L) 12.0 - 46.0 %   Monocytes Relative 12.8 (H) 3.0 - 12.0 %   Eosinophils Relative 1.0 0.0 - 5.0 %   Basophils Relative 0.5  0.0 - 3.0 %   Neutro Abs 5.8 1.4 - 7.7 K/uL   Lymphs Abs 0.6 (L) 0.7 - 4.0 K/uL   Monocytes Absolute 1.0 0.1 - 1.0 K/uL   Eosinophils Absolute 0.1 0.0 - 0.7 K/uL   Basophils Absolute 0.0 0.0 - 0.1 K/uL  Influenza A/B  Result Value Ref Range   Influenza A, POC Positive (A) Negative   Influenza B, POC Negative Negative  POC COVID-19  Result Value Ref Range  SARS Coronavirus 2 Ag Negative Negative   Assessment & Plan:   Problem List Items Addressed This Visit       Other   COVID-19 - Primary    Patient's age comorbidities and lung disease patient needs to be treated with antiviral.  Did discuss that antivirals are EUA only.  Given patient's medication list she is eligible to use molnupiravir.  Did discuss signs and symptoms when to seek urgent or emergent health care.  Also discussed CDC guidelines in regards to quarantine.  Currently do not have a pulse ox machine at home but they plan on getting 1.  Patient currently wears 3 L of oxygen all the time.  Start molnupiravir soon as possible.  Patient will require close watch      Relevant Medications   molnupiravir EUA (LAGEVRIO) 200 mg CAPS capsule     Meds ordered this encounter  Medications   molnupiravir EUA (LAGEVRIO) 200 mg CAPS capsule    Sig: Take 4 capsules (800 mg total) by mouth 2 (two) times daily for 5 days.    Dispense:  40 capsule    Refill:  0    Order Specific Question:   Supervising Provider    Answer:   TOWER, MARNE A [[id number]]   No orders of the defined types were placed in this encounter.   I discussed the assessment and treatment plan with the patient. The patient was provided an opportunity to ask questions and all were answered. The patient agreed with the plan and demonstrated an understanding of the instructions. The patient was advised to call back or seek an in-person evaluation if the symptoms worsen or if the condition fails to improve as anticipated.  Follow up plan: No follow-ups on file.  Romilda Garret, NP

## 2022-01-15 ENCOUNTER — Other Ambulatory Visit: Payer: Self-pay

## 2022-01-15 ENCOUNTER — Other Ambulatory Visit: Payer: Medicare PPO | Admitting: Hospice

## 2022-01-15 DIAGNOSIS — Z515 Encounter for palliative care: Secondary | ICD-10-CM

## 2022-01-15 DIAGNOSIS — R0602 Shortness of breath: Secondary | ICD-10-CM

## 2022-01-15 DIAGNOSIS — F0390 Unspecified dementia without behavioral disturbance: Secondary | ICD-10-CM

## 2022-01-15 DIAGNOSIS — E43 Unspecified severe protein-calorie malnutrition: Secondary | ICD-10-CM

## 2022-01-15 NOTE — Progress Notes (Signed)
[facility] Consult Note Telephone: [phone]  Fax: [phone]  PATIENT NAME: Amanda Porter DOB: [birth year] MRN: [id number]  PRIMARY CARE PROVIDER:   Pleas Koch, NP Pleas Koch, NP [place],  [place] [address]  REFERRING PROVIDER: Pleas Koch, NP Pleas Koch, NP [place],  [place] [address]  RESPONSIBLE PARTYCaryl Pina [phone] - paramedic Key pad code from [place] from 12pm [phone] home  for patient at home for visit   My pCode  [address]     Name Relation Home Work Mobile   Madelia Daughter [phone]     [name] [phone]  [phone]   [name] [phone]  [phone]       Visit is to build trust and highlight Palliative Medicine as specialized medical care for people living with serious illness, aimed at facilitating better quality of life through symptoms relief, assisting with advance care planning and complex medical decision making. NP called [name] and updated her on visit. This is a follow up visit.  RECOMMENDATIONS/PLAN:   Advance Care Planning/Code Status: Patient is a Full code.   Goals of Care: Goals of care include to maximize quality of life and symptom management.  Visit consisted of counseling and education dealing with the complex and emotionally intense issues of symptom management and palliative care in the setting of serious and potentially life-threatening illness. Palliative care team will continue to support patient, patient's family, and medical team.  Symptom Management/Plan: Dementia- Ongoing memory loss/confusion in line with dementia disease trajectory; incontinent of urine, FAST 6C. Encourage reminiscence, word search/puzzles, cueing for recollection.  Promote calm approach and engaging environment; redirection as needed.    Shortness of breath-: secondary to COPD, continue  oxygen supplementation at 3 LPM. Continue inhalers/breathing treatments as ordered. Encouraged slow deep breathing. Patient is followed by Pulmonologist.   Protein calorie malnutrition- Offer 4-6 small meals. Glucerna BID as ordered. Monitor for ongoing weight loss. Current weight is 108 Ibs Height 5 feet 2 inches   Follow up Palliative Care Visit: Palliative care will continue to follow for complex medical decision making, advance care planning, and clarification of goals. Return in 6-8 weeks or prn.    PPS: 50%   HOSPICE ELIGIBILITY/DIAGNOSIS: TBD   Chief Complaint: Palliative Medicine follow up visit    HISTORY OF PRESENT ILLNESS:  Amanda Porter a 76 y.o. female with multiple medical problems including protein calorie malnutrition, dementia, COPD, type 2 diabetes, hyperlipidemia, anemia of chronic disease, iron deficiency anemia, non displaced ulnar styloid fracture, left wrist,  left upper lobe lobectomy for lung cancer in 2000. History of Arthritis, Left shoulder pain, hydrocephalus with shunt placed initially 2018.  Patient in no acute distress, denied pain/discomfort at this time.  History  obtained from review of EMR, discussion with primary team, family and/or patient. Records reviewed and summarized above. All 10 point systems reviewed and are negative except as documented in history of present illness above  Review and summarization of Epic records shows history from other than patient.   Palliative Care was asked to follow this patient o help address complex decision making in the context of advance care planning and goals of care clarification. I reviewed, as needed, available labs, patient records, imaging, studies and related documents from the EMR.    Physical Exam: BP 128/70 P92 R 18 02 97% at 3L/Min Height/Weight: 5 feet 2 inches/108 Ibs  Constitutional: NAD General: Well groomed, cooperative EYES: anicteric sclera, lids intact, no discharge  ENMT: Moist mucous  membrane CV: S1 S2, RRR, no LE edema Pulmonary: LCTA, no increased work of breathing, no cough, oxygen supplementation at 3 L/min Abdomen: active BS + 4 quadrants, soft and non-tender GU: no suprapubic tenderness MSK: weakness, sarcopenia, limited ROM Skin: warm and dry, no rashes or wounds on visible skin Neuro:  weakness, otherwise non focal, memory loss Psych: non-anxious affect Hem/lymph/immuno: no widespread bruising PERTINENT MEDICATIONS:  Outpatient Encounter Medications as of 01/15/2022  Medication Sig   albuterol (PROVENTIL) (2.5 MG/3ML) 0.083% nebulizer solution USE 1 VIAL IN NEBULIZER EVERY 6 HOURS   albuterol (VENTOLIN HFA) 108 (90 Base) MCG/ACT inhaler INHALE 2 PUFFS BY MOUTH EVERY 4 HOURS AS NEEDED   alendronate (FOSAMAX) 70 MG tablet Take with a full glass of water on an empty stomach for bone density.   ASHWAGANDHA PO Take 1 capsule by mouth daily as needed (anxiety).   blood glucose meter kit and supplies KIT Dispense based on patient and insurance preference. Use up to four times daily as directed. (FOR ICD-9 250.00, 250.01).   busPIRone (BUSPAR) 15 MG tablet 3  qam   2 qpm   CALCIUM PO Take 1 capsule by mouth daily.   clopidogrel (PLAVIX) 75 MG tablet TAKE 1 TABLET BY MOUTH EVERY DAY   Ferrous Sulfate (IRON PO) Take by mouth.   folic acid (FOLVITE) 1 MG tablet Take 1 tablet (1 mg total) by mouth daily.   glipiZIDE (GLUCOTROL XL) 2.5 MG 24 hr tablet Take 1 tablet (2.5 mg total) by mouth daily with breakfast. For diabetes.   guaiFENesin (MUCINEX) 600 MG 12 hr tablet Take 1 tablet (600 mg total) by mouth 2 (two) times daily.   LORazepam (ATIVAN) 0.5 MG tablet 1  q am   molnupiravir EUA (LAGEVRIO) 200 mg CAPS capsule Take 4 capsules (800 mg total) by mouth 2 (two) times daily for 5 days.   Multiple Vitamin (MULTIVITAMIN) capsule Take 1 capsule by mouth daily.   OXYGEN 3 L continuous.    rosuvastatin (CRESTOR) 20 MG tablet TAKE 1 TABLET (20 MG TOTAL) BY MOUTH EVERY EVENING.  FOR CHOLESTEROL.   tamsulosin (FLOMAX) 0.4 MG CAPS capsule Take 1 capsule (0.4 mg total) by mouth daily. For urine retention   Tiotropium Bromide-Olodaterol (STIOLTO RESPIMAT) 2.5-2.5 MCG/ACT AERS INHALE 2 PUFFS BY MOUTH INTO THE LUNGS DAILY   vitamin B-12 1000 MCG tablet Take 1 tablet (1,000 mcg total) by mouth daily.   No facility-administered encounter medications on file as of 01/15/2022.    HOSPICE ELIGIBILITY/DIAGNOSIS: TBD  PAST MEDICAL HISTORY:  Past Medical History:  Diagnosis Date   Acute respiratory failure requiring reintubation ([place]) 11/05/2021   Anxiety    Anxiety and depression    COPD (chronic obstructive pulmonary disease) (H[facility])    CVA (cerebral vascular accident) ([facility])    CVA (cerebral vascular accident) ([facility])    L sided deficits   Dementia ([place])    Depression    Diabetes mellitus ([facility])    Type 2   Diabetes mellitus without complication ([facility])    Dysphagia    Dysphasia    History of lung cancer    Hyperlipidemia    Memory disturbance 11/20/2017   Normal pressure hydrocephalus ([facility]) 2018   Retinal detachment    Right   Right carotid bruit 11/20/2017   Tardive dyskinesia 02/01/2020   Tardive dyskinesia    Thrombosis    Arterial to lower extremity?  ALLERGIES:  Allergies  Allergen Reactions   Lexapro [Escitalopram Oxalate] Other (See Comments)    Dizziness, nausea, fatigue   Lexapro [Escitalopram]    Neosporin  [Neomycin-Polymyxin-Gramicidin] Rash    (Neosporin)   Neosporin [Bacitracin-Polymyxin B] Rash      I spent  60 minutes providing this consultation; this includes time spent with patient/family, chart review and documentation. More than 50% of the time in this consultation was spent on counseling and coordinating communication   Thank you for the opportunity to participate in the care of [place] Please call our office at [phone] if we can be of additional assistance.  Note: Portions of this note were generated with Geographical information systems officer. Dictation errors may occur despite best attempts at proofreading.  Teodoro Spray, NP

## 2022-01-17 DIAGNOSIS — J449 Chronic obstructive pulmonary disease, unspecified: Secondary | ICD-10-CM | POA: Diagnosis not present

## 2022-01-23 DIAGNOSIS — R2689 Other abnormalities of gait and mobility: Secondary | ICD-10-CM | POA: Diagnosis not present

## 2022-01-23 DIAGNOSIS — Z982 Presence of cerebrospinal fluid drainage device: Secondary | ICD-10-CM | POA: Diagnosis not present

## 2022-01-23 DIAGNOSIS — G912 (Idiopathic) normal pressure hydrocephalus: Secondary | ICD-10-CM | POA: Diagnosis not present

## 2022-01-23 DIAGNOSIS — R42 Dizziness and giddiness: Secondary | ICD-10-CM | POA: Diagnosis not present

## 2022-01-24 ENCOUNTER — Telehealth: Payer: Medicare PPO

## 2022-01-26 DIAGNOSIS — J449 Chronic obstructive pulmonary disease, unspecified: Secondary | ICD-10-CM | POA: Diagnosis not present

## 2022-01-27 ENCOUNTER — Telehealth: Payer: Self-pay | Admitting: Primary Care

## 2022-01-27 ENCOUNTER — Telehealth: Payer: Self-pay | Admitting: *Deleted

## 2022-01-27 NOTE — Telephone Encounter (Signed)
°  Care Management   Follow Up Note   01/27/2022 Name: LILYROSE TANNEY MRN: [id number] DOB: [birth year]   Referred by: Pleas Koch, NP Reason for referral : No chief complaint on file.   An unsuccessful telephone outreach was attempted today. The patient was referred to the case management team for assistance with care management and care coordination.   Follow Up Plan: The care management team will reach out to the patient again over the next 10 days.   Eduard Clos MSW, LCSW Licensed Clinical Social Worker [place]   [phone]

## 2022-01-27 NOTE — Telephone Encounter (Signed)
[facility] called needing Verbal Orders for Nursing   1x 4wk 1x every other week for 4wks  Tonya at [phone]

## 2022-01-28 ENCOUNTER — Telehealth: Payer: Self-pay | Admitting: *Deleted

## 2022-01-28 ENCOUNTER — Telehealth: Payer: Self-pay | Admitting: Primary Care

## 2022-01-28 NOTE — Telephone Encounter (Signed)
Home Health verbal orders Caller [facility] Agency Name: Moreen Fowler number: [phone]  Requesting OT/PT/Skilled nursing/Social Work/Speech: PT  Reason: TO RECERT  Frequency: 1W9  Please forward to [facility] pool or providers CMA

## 2022-01-28 NOTE — Telephone Encounter (Signed)
Called number left message to call office. Number did not verify what number that I was calling

## 2022-01-28 NOTE — Telephone Encounter (Signed)
Approved.  

## 2022-01-28 NOTE — Chronic Care Management (AMB) (Unsigned)
°  Care Management   Note  01/28/2022 Name: LENETTE RAU MRN: [id number] DOB: 1946/09/30  [name] is a 76 y.o. year old female who is a primary care patient of Pleas Koch, NP and is actively engaged with the care management team. I reached out to [place] by phone today to assist with re-scheduling a follow up visit with the Licensed Clinical Social Worker  Follow up plan: Unsuccessful telephone outreach attempt made. A HIPAA compliant phone message was left for the patient providing contact information and requesting a return call.   Julian Hy, [place] Management  Direct Dial: [phone]

## 2022-01-28 NOTE — Telephone Encounter (Signed)
Left message to return call to our office.  

## 2022-01-31 NOTE — Telephone Encounter (Signed)
Spoke to [place] @ [phone] confirmed verbal orders approved by Anda Kraft on 2.28.23 at 6:30am ?

## 2022-01-31 NOTE — Telephone Encounter (Signed)
Daria Pastures called the office back and stated that he had spoken to someone at the office the other day and was already given the approval. ?

## 2022-01-31 NOTE — Telephone Encounter (Signed)
Left another message on voicemail for Amanda Porter to call the office back. ?

## 2022-02-07 DIAGNOSIS — J449 Chronic obstructive pulmonary disease, unspecified: Secondary | ICD-10-CM | POA: Diagnosis not present

## 2022-02-13 ENCOUNTER — Other Ambulatory Visit: Payer: Self-pay

## 2022-02-13 ENCOUNTER — Other Ambulatory Visit: Payer: Medicare PPO | Admitting: Hospice

## 2022-02-13 DIAGNOSIS — Z87440 Personal history of urinary (tract) infections: Secondary | ICD-10-CM

## 2022-02-13 DIAGNOSIS — R569 Unspecified convulsions: Secondary | ICD-10-CM

## 2022-02-13 DIAGNOSIS — Z7902 Long term (current) use of antithrombotics/antiplatelets: Secondary | ICD-10-CM

## 2022-02-13 DIAGNOSIS — Z87891 Personal history of nicotine dependence: Secondary | ICD-10-CM

## 2022-02-13 DIAGNOSIS — G2401 Drug induced subacute dyskinesia: Secondary | ICD-10-CM | POA: Diagnosis not present

## 2022-02-13 DIAGNOSIS — F03B4 Unspecified dementia, moderate, with anxiety: Secondary | ICD-10-CM | POA: Diagnosis not present

## 2022-02-13 DIAGNOSIS — F32A Depression, unspecified: Secondary | ICD-10-CM | POA: Diagnosis not present

## 2022-02-13 DIAGNOSIS — J449 Chronic obstructive pulmonary disease, unspecified: Secondary | ICD-10-CM | POA: Diagnosis not present

## 2022-02-13 DIAGNOSIS — G919 Hydrocephalus, unspecified: Secondary | ICD-10-CM | POA: Diagnosis not present

## 2022-02-13 DIAGNOSIS — Z9181 History of falling: Secondary | ICD-10-CM

## 2022-02-13 DIAGNOSIS — I10 Essential (primary) hypertension: Secondary | ICD-10-CM | POA: Diagnosis not present

## 2022-02-13 DIAGNOSIS — R1310 Dysphagia, unspecified: Secondary | ICD-10-CM

## 2022-02-13 DIAGNOSIS — R32 Unspecified urinary incontinence: Secondary | ICD-10-CM

## 2022-02-13 DIAGNOSIS — Z8673 Personal history of transient ischemic attack (TIA), and cerebral infarction without residual deficits: Secondary | ICD-10-CM

## 2022-02-13 DIAGNOSIS — Z982 Presence of cerebrospinal fluid drainage device: Secondary | ICD-10-CM

## 2022-02-13 DIAGNOSIS — Z7984 Long term (current) use of oral hypoglycemic drugs: Secondary | ICD-10-CM

## 2022-02-13 DIAGNOSIS — F03B3 Unspecified dementia, moderate, with mood disturbance: Secondary | ICD-10-CM | POA: Diagnosis not present

## 2022-02-13 DIAGNOSIS — S62102D Fracture of unspecified carpal bone, left wrist, subsequent encounter for fracture with routine healing: Secondary | ICD-10-CM | POA: Diagnosis not present

## 2022-02-13 DIAGNOSIS — Z9981 Dependence on supplemental oxygen: Secondary | ICD-10-CM

## 2022-02-13 DIAGNOSIS — E119 Type 2 diabetes mellitus without complications: Secondary | ICD-10-CM | POA: Diagnosis not present

## 2022-02-23 DIAGNOSIS — J449 Chronic obstructive pulmonary disease, unspecified: Secondary | ICD-10-CM | POA: Diagnosis not present

## 2022-02-25 NOTE — Chronic Care Management (AMB) (Signed)
?  Care Management  ? ?Note ? ?02/25/2022 ?Name: Amanda Porter MRN: [id number] DOB: [birth year] ? ?Amanda Porter is a 76 y.o. year old female who is a primary care patient of Pleas Koch, NP and is actively engaged with the care management team. I reached out to [name] by phone today to assist with re-scheduling a follow up visit with the Licensed Clinical Social Worker ? ?Follow up plan: ?2nd Unsuccessful telephone outreach attempt made. A HIPAA compliant phone message was left for the patient providing contact information and requesting a return call.  ? ?Caycee Wanat, CCMA ?Care Guide, Embedded Care Coordination ?[facility]  Care Management  ?Direct Dial: [phone] ? ? ?

## 2022-02-26 IMAGING — DX DG CHEST 2V
2 series · 2 of 2 positions shown · non-contrast
Comparison: November 06, 2021.

CLINICAL DATA: Dyspnea.

EXAM:
CHEST - 2 VIEW

[chest pa]
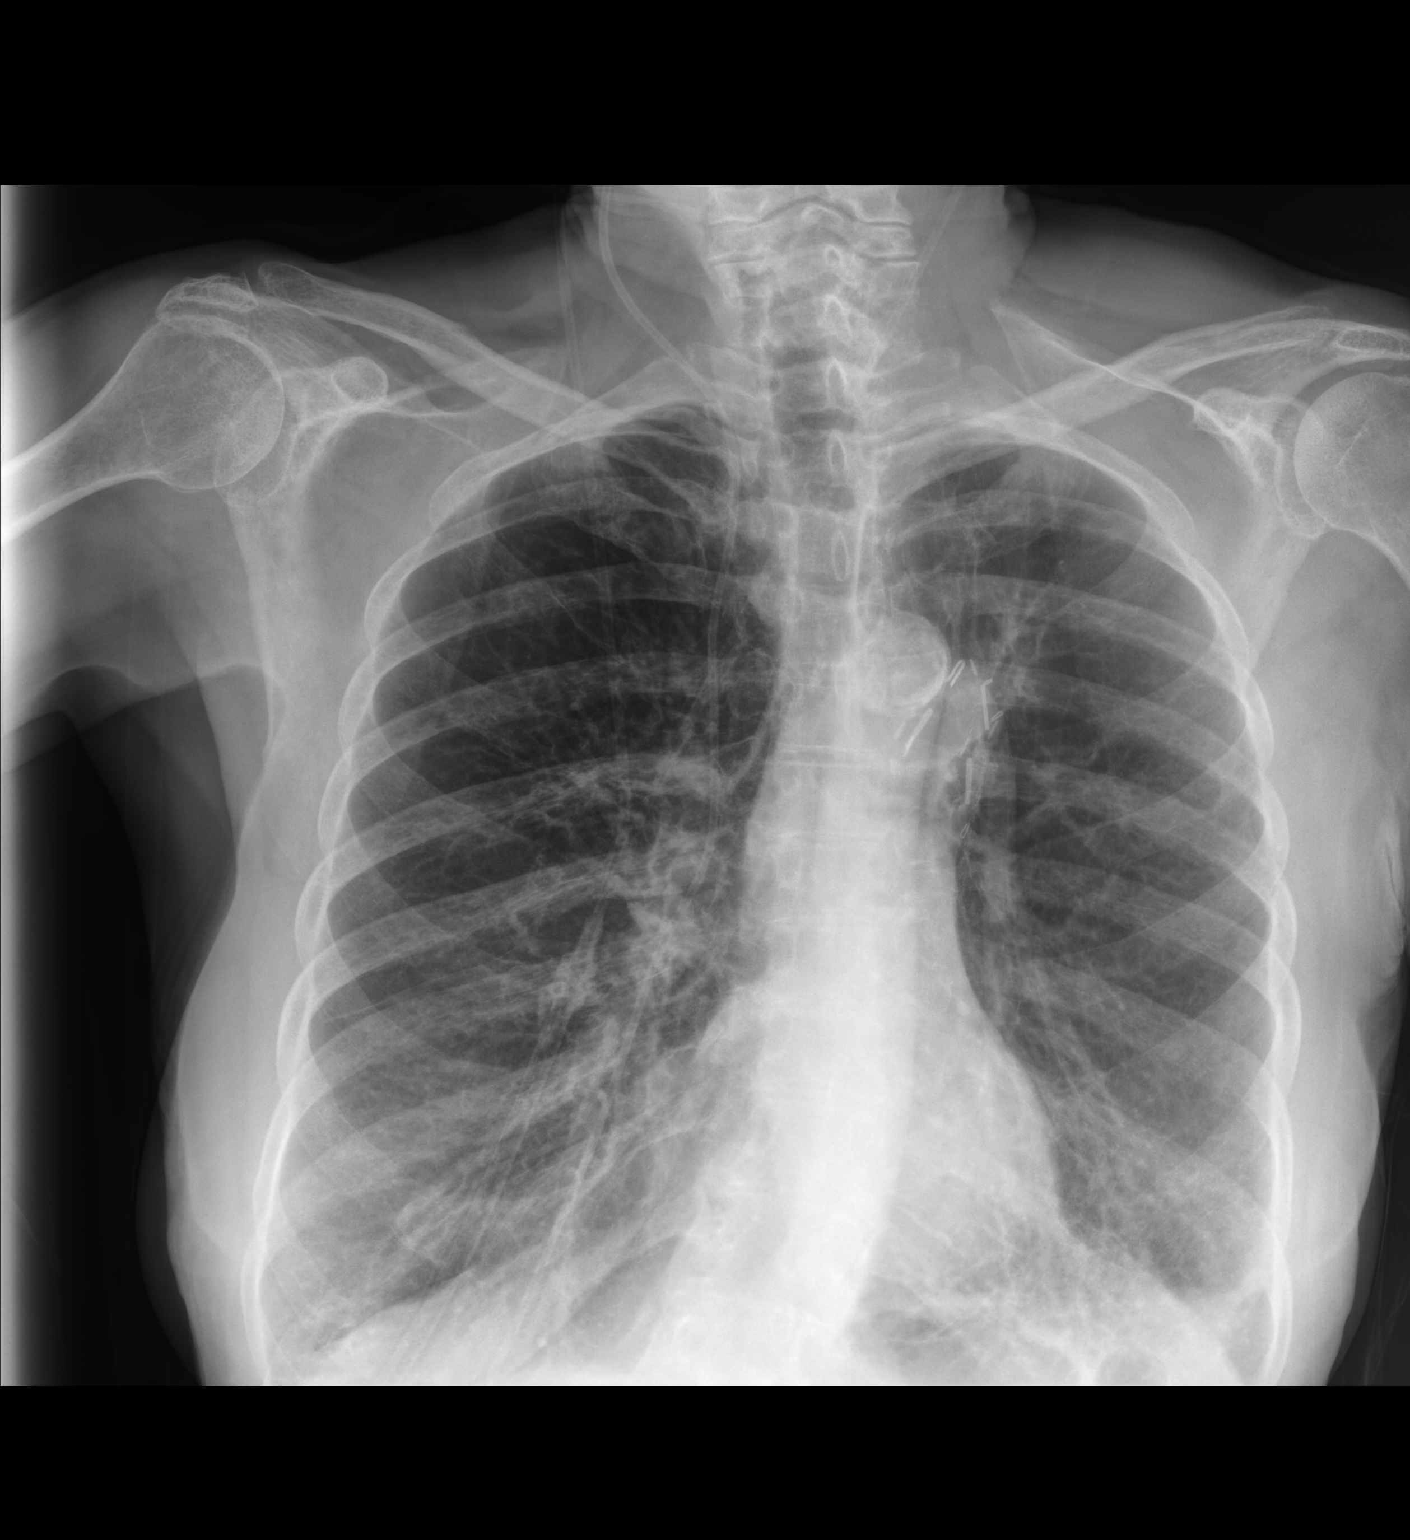

[chest lat]
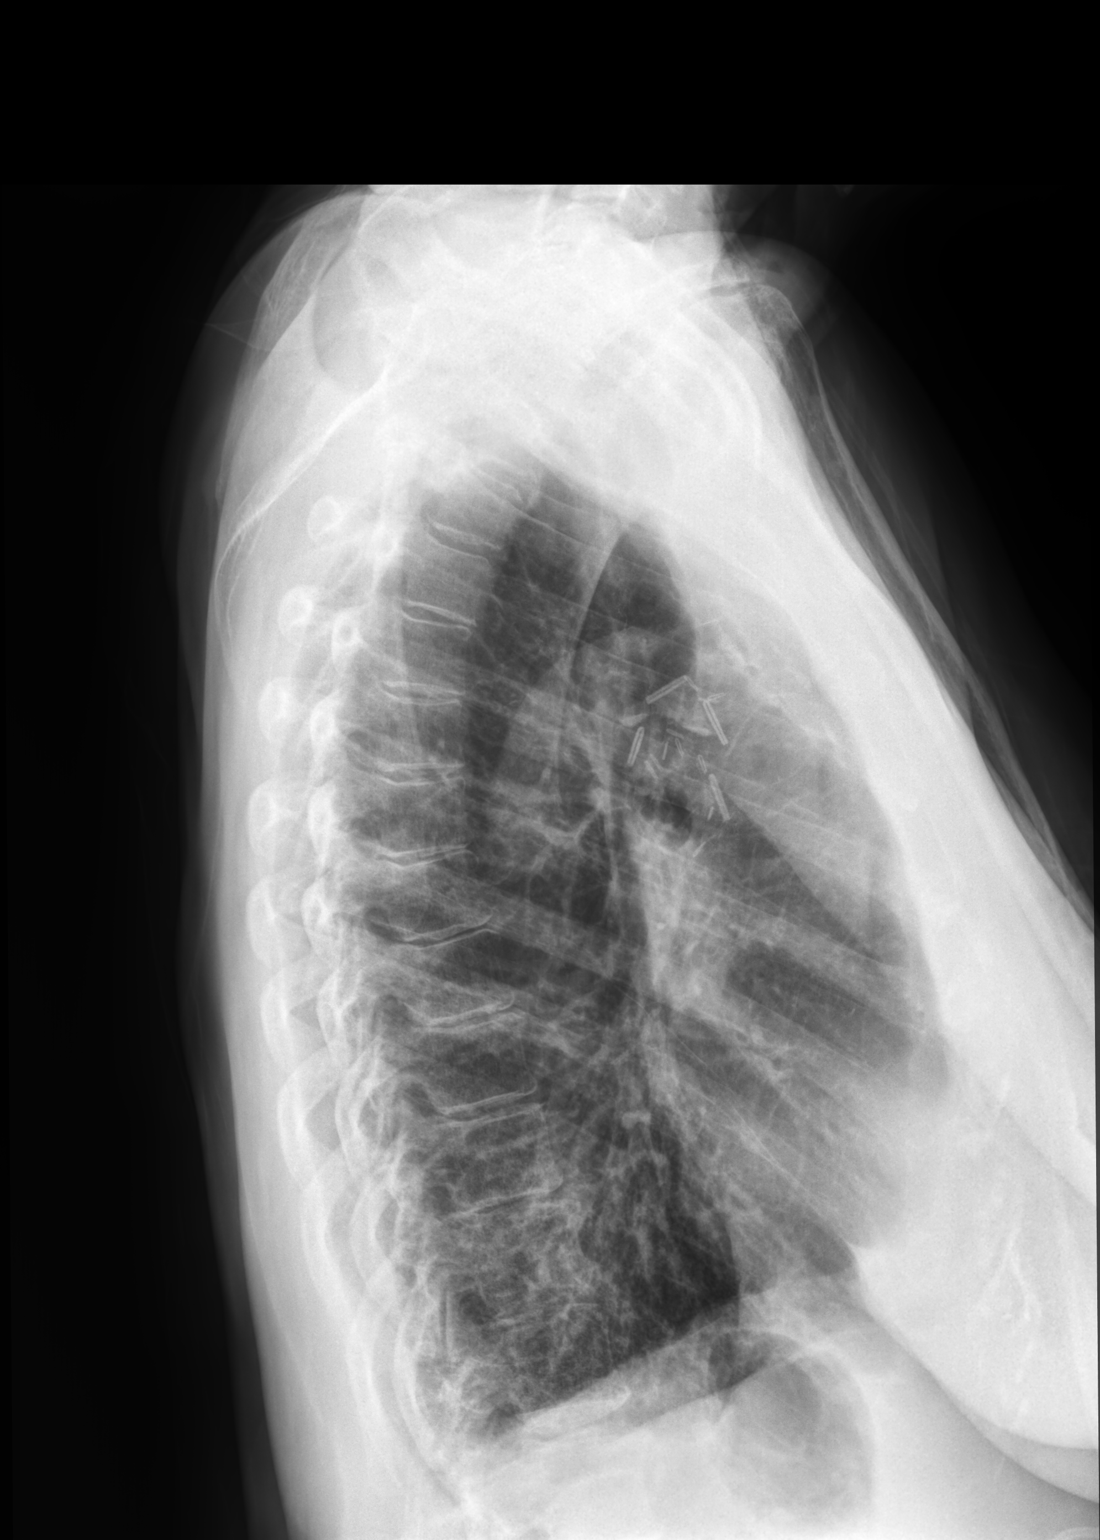

[2 of 2 positions shown; findings below may reference images not displayed]

FINDINGS: The heart size and mediastinal contours are within normal limits.
Left perihilar postoperative changes are again noted. Increased
bibasilar opacities are noted, left greater than right, concerning
for atelectasis or possibly infiltrate. The visualized skeletal
structures are unremarkable.
IMPRESSION: Increased bibasilar opacities are noted, left greater than right,
concerning for subsegmental atelectasis or possibly infiltrates.

## 2022-02-27 ENCOUNTER — Telehealth: Payer: Self-pay

## 2022-02-27 DIAGNOSIS — I6389 Other cerebral infarction: Secondary | ICD-10-CM | POA: Diagnosis not present

## 2022-02-27 DIAGNOSIS — Z982 Presence of cerebrospinal fluid drainage device: Secondary | ICD-10-CM | POA: Diagnosis not present

## 2022-02-27 DIAGNOSIS — I517 Cardiomegaly: Secondary | ICD-10-CM | POA: Diagnosis not present

## 2022-02-27 DIAGNOSIS — R42 Dizziness and giddiness: Secondary | ICD-10-CM | POA: Diagnosis not present

## 2022-02-27 DIAGNOSIS — Z9889 Other specified postprocedural states: Secondary | ICD-10-CM | POA: Diagnosis not present

## 2022-02-27 DIAGNOSIS — R9082 White matter disease, unspecified: Secondary | ICD-10-CM | POA: Diagnosis not present

## 2022-02-27 DIAGNOSIS — Z4541 Encounter for adjustment and management of cerebrospinal fluid drainage device: Secondary | ICD-10-CM | POA: Diagnosis not present

## 2022-02-27 DIAGNOSIS — G912 (Idiopathic) normal pressure hydrocephalus: Secondary | ICD-10-CM | POA: Diagnosis not present

## 2022-02-27 NOTE — Telephone Encounter (Signed)
Amanda Porter with [facility] requesting verbal orders for an OT evaluation.  ? ?Call back [phone] ?

## 2022-02-27 NOTE — Telephone Encounter (Signed)
Approved.  

## 2022-02-28 NOTE — Telephone Encounter (Signed)
Araceli Bouche notified by telephone that verbal order has been approved. ?

## 2022-03-04 ENCOUNTER — Telehealth (HOSPITAL_COMMUNITY): Payer: Medicare PPO | Admitting: Psychiatry

## 2022-03-07 DIAGNOSIS — J449 Chronic obstructive pulmonary disease, unspecified: Secondary | ICD-10-CM | POA: Diagnosis not present

## 2022-03-10 ENCOUNTER — Telehealth: Payer: Self-pay

## 2022-03-10 NOTE — Telephone Encounter (Signed)
Approved.  

## 2022-03-10 NOTE — Telephone Encounter (Signed)
Amanda Porter from [facility] asking for verbal orders for OT 1 x week x 3 weeks ?

## 2022-03-11 NOTE — Telephone Encounter (Signed)
Left message on voicemail to call the office back. 

## 2022-03-11 NOTE — Telephone Encounter (Signed)
Verbal given to [facility]  ?

## 2022-03-12 ENCOUNTER — Ambulatory Visit (HOSPITAL_BASED_OUTPATIENT_CLINIC_OR_DEPARTMENT_OTHER): Payer: Medicare PPO | Admitting: Psychiatry

## 2022-03-12 DIAGNOSIS — F32A Depression, unspecified: Secondary | ICD-10-CM

## 2022-03-12 DIAGNOSIS — F419 Anxiety disorder, unspecified: Secondary | ICD-10-CM

## 2022-03-12 DIAGNOSIS — F339 Major depressive disorder, recurrent, unspecified: Secondary | ICD-10-CM | POA: Diagnosis not present

## 2022-03-12 MED ORDER — CLONAZEPAM 1 MG PO TABS: ORAL_TABLET | ORAL | 4 refills | Status: DC

## 2022-03-12 MED ORDER — BUPROPION HCL ER (XL) 150 MG PO TB24: ORAL_TABLET | ORAL | 3 refills | Status: DC

## 2022-03-12 MED ORDER — BUSPIRONE HCL 15 MG PO TABS: ORAL_TABLET | ORAL | 4 refills | Status: DC

## 2022-03-12 NOTE — Progress Notes (Signed)
Psychiatric Initial Adult Assessment  ? ?Patient Identification: Amanda Porter ?MRN:  [id number] ?Date of Evaluation:  03/12/2022 ?Referral Source: [facility] ?Chief Complaint:   ?Visit Diagnosis: Adjustment disorder with anxiety ? ?History of Present Illness: Anxiety ? ?Today the patient is seen with her daughter [name].  The patient says she is doing fine and does not think much has happened since I saw her.  But actually she is wrong.  Apparently the daughter actually went on vacation and left her mother with some people to take care of her.  There is a lot of confusion and the patient adjusted her medicine as she felt fit.  She ended up overdosing on Wellbutrin.  She took 3 times more than she should have consistently she became confused had a seizure was hospitalized and was intubated.  She spent a certain amount of time in the hospital but she cannot have it well.  Now she is home.  She is no longer confused.  It is now evident that she overused overtook her Wellbutrin.  This was not a suicide attempt.  She does not have an underlying seizure disorder at all.  The patient says that she is anxious and her daughter says she is very depressed and irritable.  She is not getting along with anybody around her.  This is distinctly different than her baseline.  She sleeps fairly well she is eating okay but she is irritable and enjoying things much less.  She clearly is not herself.  Note is that she is not on the Wellbutrin at this time.  When she tried to take the Ativan in the morning instead of at night she felt over intoxicated. ? ?Virtual Visit via Telephone Note ? ?I connected with Mystie A Menchaca 10/02/2021 at  2:00 PM EDT by telephone and verified that I am speaking with the correct person using two identifiers. ? ?Location: ?Patient: home ?Provider: office ?  ?I discussed the limitations, risks, security and privacy concerns of performing an evaluation and management service by telephone and the  availability of in person appointments. I also discussed with the patient that there may be a patient responsible charge related to this service. The patient expressed understanding and agreed to proceed. ? ? ?  ?I discussed the assessment and treatment plan with the patient. The patient was provided an opportunity to ask questions and all were answered. The patient agreed with the plan and demonstrated an understanding of the instructions. ?  ?The patient was advised to call back or seek an in-person evaluation if the symptoms worsen or if the condition fails to improve as anticipated. ? ?I provided 30 minutes of non-face-to-face time during this encounter. ? ? ?Jerral Ralph, MD  ?nic Symptoms:   ?Anxiety Symptoms:  Excessive Worry, ?Psychotic Symptoms:   ?PTSD Symptoms: ? ? ?Past Psychiatric History: Multiple antidepressants, SSRIs Remeron ? ?Previous Psychotropic Medications:  ? ?Substance Abuse History in the last 12 months:   ? ?Consequences of Substance Abuse: ? ? ?Past Medical History:  ?Past Medical History:  ?Diagnosis Date  ? Acute respiratory failure requiring reintubation ([place]) 11/05/2021  ? Anxiety   ? Anxiety and depression   ? COPD (chronic obstructive pulmonary disease) ([place])   ? CVA (cerebral vascular accident) [facility])   ? CVA (cerebral vascular accident) [facility])   ? L sided deficits  ? Dementia ([place])   ? Depression   ? Diabetes mellitus ([place])   ? Type 2  ? Diabetes mellitus without complication (  [place])   ? Dysphagia   ? Dysphasia   ? History of lung cancer   ? Hyperlipidemia   ? Memory disturbance 11/20/2017  ? Normal pressure hydrocephalus ([place]) 2018  ? Retinal detachment   ? Right  ? Right carotid bruit 11/20/2017  ? Tardive dyskinesia 02/01/2020  ? Tardive dyskinesia   ? Thrombosis   ? Arterial to lower extremity?  ?  ?Past Surgical History:  ?Procedure Laterality Date  ? LUNG REMOVAL, PARTIAL    ? left upper lobe  ? VENTRICULOPERITONEAL SHUNT  12/09/2016  ? VENTRICULOPERITONEAL SHUNT    ? ? ?Family  Psychiatric History:  ? ?Family History:  ?Family History  ?Problem Relation Age of Onset  ? Pneumonia Mother   ? Cancer Father   ? Diabetes Sister   ? Diabetes Sister   ? Breast cancer Neg Hx   ? ? ?Social History:   ?Social History  ? ?Socioeconomic History  ? Marital status: Divorced  ?  Spouse name: Not on file  ? Number of children: 1  ? Years of education: trade school  ? Highest education level: Not on file  ?Occupational History  ? Occupation: Retired  ?Tobacco Use  ? Smoking status: Former  ?  Packs/day: 0.50  ?  Years: 50.00  ?  Pack years: 25.00  ?  Types: Cigarettes  ?  Quit date: 08/01/2017  ?  Years since quitting: 4.6  ? Smokeless tobacco: Never  ?Vaping Use  ? Vaping Use: Never used  ?Substance and Sexual Activity  ? Alcohol use: Not Currently  ? Drug use: Not Currently  ? Sexual activity: Not Currently  ?Other Topics Concern  ? Not on file  ?Social History Narrative  ? ** Merged History Encounter **  ?    ? Moved from [place]. ?Lives with daughter. ?Right handed ?  ? ?Social Determinants of Health  ? ?Financial Resource Strain: Not on file  ?Food Insecurity: No Food Insecurity  ? Worried About Charity fundraiser in the Last Year: Never true  ? Ran Out of Food in the Last Year: Never true  ?Transportation Needs: No Transportation Needs  ? Lack of Transportation (Medical): No  ? Lack of Transportation (Non-Medical): No  ?Physical Activity: Not on file  ?Stress: Not on file  ?Social Connections: Not on file  ? ? ?Additional Social History:  ? ?Allergies:   ?Allergies  ?Allergen Reactions  ? Lexapro [Escitalopram Oxalate] Other (See Comments)  ?  Dizziness, nausea, fatigue  ? Lexapro [Escitalopram]   ? Neosporin  [Neomycin-Polymyxin-Gramicidin] Rash  ?  (Neosporin)  ? Neosporin [Bacitracin-Polymyxin B] Rash  ? ? ?Metabolic Disorder Labs: ?Lab Results  ?Component Value Date  ? HGBA1C 7.1 (H) 11/09/2021  ? MPG 157.07 11/09/2021  ? MPG 159.94 11/08/2021  ? ?No results found for: PROLACTIN ?Lab Results   ?Component Value Date  ? CHOL 130 03/14/2021  ? TRIG 67.0 03/14/2021  ? HDL 53.40 03/14/2021  ? CHOLHDL 2 03/14/2021  ? VLDL 13.4 03/14/2021  ? [place] 63 03/14/2021  ? [place] 80 04/23/2020  ? ?Lab Results  ?Component Value Date  ? TSH 2.463 11/08/2021  ? ? ?Therapeutic Level Labs: ?No results found for: LITHIUM ?No results found for: CBMZ ?No results found for: VALPROATE ? ?Current Medications: ?Current Outpatient Medications  ?Medication Sig Dispense Refill  ? buPROPion (WELLBUTRIN XL) 150 MG 24 hr tablet 1 qam for 5 days then 2  qam 60 tablet 3  ? clonazePAM (KLONOPIN) 1 MG tablet  1 qhs 30 tablet 4  ? albuterol (PROVENTIL) (2.5 MG/3ML) 0.083% nebulizer solution USE 1 VIAL IN NEBULIZER EVERY 6 HOURS 360 mL 11  ? albuterol (VENTOLIN HFA) 108 (90 Base) MCG/ACT inhaler INHALE 2 PUFFS BY MOUTH EVERY 4 HOURS AS NEEDED 18 g 12  ? alendronate (FOSAMAX) 70 MG tablet Take with a full glass of water on an empty stomach for bone density. 12 tablet 3  ? ASHWAGANDHA PO Take 1 capsule by mouth daily as needed (anxiety).    ? blood glucose meter kit and supplies KIT Dispense based on patient and insurance preference. Use up to four times daily as directed. (FOR ICD-9 250.00, 250.01). 1 each 0  ? busPIRone (BUSPAR) 15 MG tablet 3  qam   2 qpm 150 tablet 4  ? CALCIUM PO Take 1 capsule by mouth daily.    ? clopidogrel (PLAVIX) 75 MG tablet TAKE 1 TABLET BY MOUTH EVERY DAY 90 tablet 3  ? Ferrous Sulfate (IRON PO) Take by mouth.    ? folic acid (FOLVITE) 1 MG tablet Take 1 tablet (1 mg total) by mouth daily. 90 tablet 3  ? glipiZIDE (GLUCOTROL XL) 2.5 MG 24 hr tablet Take 1 tablet (2.5 mg total) by mouth daily with breakfast. For diabetes. 90 tablet 3  ? guaiFENesin (MUCINEX) 600 MG 12 hr tablet Take 1 tablet (600 mg total) by mouth 2 (two) times daily.    ? LORazepam (ATIVAN) 0.5 MG tablet 1  q am 30 tablet 4  ? Multiple Vitamin (MULTIVITAMIN) capsule Take 1 capsule by mouth daily.    ? OXYGEN 3 L continuous.     ? rosuvastatin  (CRESTOR) 20 MG tablet TAKE 1 TABLET (20 MG TOTAL) BY MOUTH EVERY EVENING. FOR CHOLESTEROL. 90 tablet 3  ? tamsulosin (FLOMAX) 0.4 MG CAPS capsule Take 1 capsule (0.4 mg total) by mouth daily. For urine retention 90 c

## 2022-03-18 NOTE — Chronic Care Management (AMB) (Signed)
?  Care Management  ? ?Note ? ?03/18/2022 ?Name: Amanda Porter MRN: [id number] DOB: September 28, 1946 ? ?Amanda Porter is a 76 y.o. year old female who is a primary care patient of Pleas Koch, NP and is actively engaged with the care management team. I reached out to Ione by phone today to assist with re-scheduling a follow up visit with the Licensed Clinical Social Worker ? ?Follow up plan: ?We have been unable to make contact with the patient for follow up. The care management team is available to follow up with the patient after provider conversation with the patient regarding recommendation for care management engagement and subsequent re-referral to the care management team.  ? ?Inri Sobieski, CCMA ?Care Guide, Embedded Care Coordination ?[facility]  Care Management  ?Direct Dial: [phone] ? ? ?

## 2022-03-26 DIAGNOSIS — J449 Chronic obstructive pulmonary disease, unspecified: Secondary | ICD-10-CM | POA: Diagnosis not present

## 2022-03-27 ENCOUNTER — Other Ambulatory Visit: Payer: Self-pay | Admitting: Primary Care

## 2022-03-27 DIAGNOSIS — R339 Retention of urine, unspecified: Secondary | ICD-10-CM

## 2022-04-03 ENCOUNTER — Other Ambulatory Visit (HOSPITAL_COMMUNITY): Payer: Self-pay | Admitting: Psychiatry

## 2022-04-04 ENCOUNTER — Encounter: Payer: Self-pay | Admitting: Family

## 2022-04-04 ENCOUNTER — Ambulatory Visit: Payer: Medicare PPO | Admitting: Family

## 2022-04-04 ENCOUNTER — Ambulatory Visit (INDEPENDENT_AMBULATORY_CARE_PROVIDER_SITE_OTHER)
Admission: RE | Admit: 2022-04-04 | Discharge: 2022-04-04 | Disposition: A | Discharge status: Home / Self Care | Payer: Medicare PPO | Source: Ambulatory Visit | Attending: Family | Admitting: Family

## 2022-04-04 VITALS — BP 108/70 | HR 63 | Temp 97.7°F | Resp 16 | Ht 63.0 in | Wt 113.0 lb

## 2022-04-04 DIAGNOSIS — J90 Pleural effusion, not elsewhere classified: Secondary | ICD-10-CM | POA: Diagnosis not present

## 2022-04-04 DIAGNOSIS — R5383 Other fatigue: Secondary | ICD-10-CM | POA: Insufficient documentation

## 2022-04-04 DIAGNOSIS — E119 Type 2 diabetes mellitus without complications: Secondary | ICD-10-CM

## 2022-04-04 DIAGNOSIS — E876 Hypokalemia: Secondary | ICD-10-CM | POA: Insufficient documentation

## 2022-04-04 DIAGNOSIS — R34 Anuria and oliguria: Secondary | ICD-10-CM | POA: Diagnosis not present

## 2022-04-04 DIAGNOSIS — E559 Vitamin D deficiency, unspecified: Secondary | ICD-10-CM

## 2022-04-04 DIAGNOSIS — R062 Wheezing: Secondary | ICD-10-CM

## 2022-04-04 DIAGNOSIS — D649 Anemia, unspecified: Secondary | ICD-10-CM

## 2022-04-04 DIAGNOSIS — R339 Retention of urine, unspecified: Secondary | ICD-10-CM

## 2022-04-04 LAB — POC URINALSYSI DIPSTICK (AUTOMATED)
Bilirubin, UA: NEGATIVE
Blood, UA: NEGATIVE
Glucose, UA: NEGATIVE
Ketones, UA: NEGATIVE
Nitrite, UA: NEGATIVE
Protein, UA: NEGATIVE
Spec Grav, UA: 1.01 (ref 1.010–1.025)
Urobilinogen, UA: 0.2 E.U./dL
pH, UA: 6.5 (ref 5.0–8.0)

## 2022-04-04 LAB — IBC + FERRITIN
Ferritin: 28.9 ng/mL (ref 10.0–291.0)
Iron: 51 ug/dL (ref 42–145)
Saturation Ratios: 14.5 % — ABNORMAL LOW (ref 20.0–50.0)
TIBC: 352.8 ug/dL (ref 250.0–450.0)
Transferrin: 252 mg/dL (ref 212.0–360.0)

## 2022-04-04 LAB — CBC WITH DIFFERENTIAL/PLATELET
Basophils Absolute: 0 10*3/uL (ref 0.0–0.1)
Basophils Relative: 0.3 % (ref 0.0–3.0)
Eosinophils Absolute: 0.2 10*3/uL (ref 0.0–0.7)
Eosinophils Relative: 3.3 % (ref 0.0–5.0)
HCT: 34 % — ABNORMAL LOW (ref 36.0–46.0)
Hemoglobin: 11.1 g/dL — ABNORMAL LOW (ref 12.0–15.0)
Lymphocytes Relative: 37.6 % (ref 12.0–46.0)
Lymphs Abs: 2.5 10*3/uL (ref 0.7–4.0)
MCHC: 32.6 g/dL (ref 30.0–36.0)
MCV: 90.5 fl (ref 78.0–100.0)
Monocytes Absolute: 0.7 10*3/uL (ref 0.1–1.0)
Monocytes Relative: 9.7 % (ref 3.0–12.0)
Neutro Abs: 3.3 10*3/uL (ref 1.4–7.7)
Neutrophils Relative %: 49.1 % (ref 43.0–77.0)
Platelets: 293 10*3/uL (ref 150.0–400.0)
RBC: 3.76 Mil/uL — ABNORMAL LOW (ref 3.87–5.11)
RDW: 14.7 % (ref 11.5–15.5)
WBC: 6.8 10*3/uL (ref 4.0–10.5)

## 2022-04-04 LAB — BRAIN NATRIURETIC PEPTIDE: Pro B Natriuretic peptide (BNP): 13 pg/mL (ref 0.0–100.0)

## 2022-04-04 LAB — VITAMIN D 25 HYDROXY (VIT D DEFICIENCY, FRACTURES): VITD: 28.01 ng/mL — ABNORMAL LOW (ref 30.00–100.00)

## 2022-04-04 LAB — COMPREHENSIVE METABOLIC PANEL
ALT: 9 U/L (ref 0–35)
AST: 13 U/L (ref 0–37)
Albumin: 3.9 g/dL (ref 3.5–5.2)
Alkaline Phosphatase: 59 U/L (ref 39–117)
BUN: 15 mg/dL (ref 6–23)
CO2: 33 mEq/L — ABNORMAL HIGH (ref 19–32)
Calcium: 9.2 mg/dL (ref 8.4–10.5)
Chloride: 100 mEq/L (ref 96–112)
Creatinine, Ser: 0.59 mg/dL (ref 0.40–1.20)
GFR: 87.13 mL/min (ref >60.00)
Glucose, Bld: 155 mg/dL — ABNORMAL HIGH (ref 70–99)
Potassium: 3.6 mEq/L (ref 3.5–5.1)
Sodium: 140 mEq/L (ref 135–145)
Total Bilirubin: 0.3 mg/dL (ref 0.2–1.2)
Total Protein: 7.3 g/dL (ref 6.0–8.3)

## 2022-04-04 LAB — B12 AND FOLATE PANEL
Folate: >23.5 ng/mL (ref >5.9)
Vitamin B-12: 833 pg/mL (ref 211–911)

## 2022-04-04 LAB — HEMOGLOBIN A1C: Hgb A1c MFr Bld: 7.6 % — ABNORMAL HIGH (ref 4.6–6.5)

## 2022-04-04 MED ORDER — ACCU-CHEK SOFTCLIX LANCETS MISC: 12 refills | Status: DC

## 2022-04-04 MED ORDER — GLUCOSE BLOOD VI STRP: ORAL_STRIP | 12 refills | Status: DC

## 2022-04-04 NOTE — Assessment & Plan Note (Signed)
Order cbc ibc ferritin  ?Pending results ?

## 2022-04-04 NOTE — Progress Notes (Signed)
? ?Established Patient Office Visit ? ?Subjective:  ?Patient ID: Amanda Porter, female    DOB: 1946/04/13  Age: 76 y.o. MRN: [id number] ? ?CC:  ?Chief Complaint  ?Patient presents with  ? Fatigue  ? Urinary Frequency  ? ? ?HPI ?Amanda Porter is here today with concerns.  ? ?Last 1-2 weeks not acting as much like herself, fatigue increasing, chills,  ?Daughter seems to think that she might not be peeing as often as she is not changing her depends as often. On continuous oxygen, no increased sob.  ?Does have increased weakness and fatigue. ?No fever.  ? ?Dizzy, but this is chronic.  ? ?Lab Results  ?Component Value Date  ? WBC 7.4 12/13/2021  ? HGB 10.2 (L) 12/13/2021  ? HCT 32.7 (L) 12/13/2021  ? MCV 92.4 12/13/2021  ? PLT 235.0 12/13/2021  ?Last potassium 3.4  ? ? ?Past Medical History:  ?Diagnosis Date  ? Acute respiratory failure requiring reintubation ([place]) 11/05/2021  ? Anxiety   ? Anxiety and depression   ? COPD (chronic obstructive pulmonary disease) ([place])   ? CVA (cerebral vascular accident) [facility])   ? CVA (cerebral vascular accident) [facility])   ? L sided deficits  ? Dementia ([place])   ? Depression   ? Diabetes mellitus ([place])   ? Type 2  ? Diabetes mellitus without complication ([place])   ? Dysphagia   ? Dysphasia   ? History of lung cancer   ? Hyperlipidemia   ? Memory disturbance 11/20/2017  ? Normal pressure hydrocephalus ([place]) 2018  ? Retinal detachment   ? Right  ? Right carotid bruit 11/20/2017  ? Tardive dyskinesia 02/01/2020  ? Tardive dyskinesia   ? Thrombosis   ? Arterial to lower extremity?  ? ? ?Past Surgical History:  ?Procedure Laterality Date  ? LUNG REMOVAL, PARTIAL    ? left upper lobe  ? VENTRICULOPERITONEAL SHUNT  12/09/2016  ? VENTRICULOPERITONEAL SHUNT    ? ? ?Family History  ?Problem Relation Age of Onset  ? Pneumonia Mother   ? Cancer Father   ? Diabetes Sister   ? Diabetes Sister   ? Breast cancer Neg Hx   ? ? ?Social History  ? ?Socioeconomic History  ? Marital status: Divorced  ?  Spouse name: Not  on file  ? Number of children: 1  ? Years of education: trade school  ? Highest education level: Not on file  ?Occupational History  ? Occupation: Retired  ?Tobacco Use  ? Smoking status: Former  ?  Packs/day: 0.50  ?  Years: 50.00  ?  Pack years: 25.00  ?  Types: Cigarettes  ?  Quit date: 08/01/2017  ?  Years since quitting: 4.6  ? Smokeless tobacco: Never  ?Vaping Use  ? Vaping Use: Never used  ?Substance and Sexual Activity  ? Alcohol use: Not Currently  ? Drug use: Not Currently  ? Sexual activity: Not Currently  ?Other Topics Concern  ? Not on file  ?Social History Narrative  ? ** Merged History Encounter **  ?    ? Moved from [place]. ?Lives with daughter. ?Right handed ?  ? ?Social Determinants of Health  ? ?Financial Resource Strain: Not on file  ?Food Insecurity: No Food Insecurity  ? Worried About Charity fundraiser in the Last Year: Never true  ? Ran Out of Food in the Last Year: Never true  ?Transportation Needs: No Transportation Needs  ? Lack of Transportation (Medical): No  ? Lack of  Transportation (Non-Medical): No  ?Physical Activity: Not on file  ?Stress: Not on file  ?Social Connections: Not on file  ?Intimate Partner Violence: Not on file  ? ? ?Outpatient Medications Prior to Visit  ?Medication Sig Dispense Refill  ? albuterol (PROVENTIL) (2.5 MG/3ML) 0.083% nebulizer solution USE 1 VIAL IN NEBULIZER EVERY 6 HOURS 360 mL 11  ? albuterol (VENTOLIN HFA) 108 (90 Base) MCG/ACT inhaler INHALE 2 PUFFS BY MOUTH EVERY 4 HOURS AS NEEDED 18 g 12  ? alendronate (FOSAMAX) 70 MG tablet Take with a full glass of water on an empty stomach for bone density. 12 tablet 3  ? ASHWAGANDHA PO Take 1 capsule by mouth daily as needed (anxiety).    ? blood glucose meter kit and supplies KIT Dispense based on patient and insurance preference. Use up to four times daily as directed. (FOR ICD-9 250.00, 250.01). 1 each 0  ? buPROPion (WELLBUTRIN XL) 150 MG 24 hr tablet 1 qam for 5 days then 2  qam 60 tablet 3  ? busPIRone  (BUSPAR) 15 MG tablet 3  qam   2 qpm 150 tablet 4  ? CALCIUM PO Take 1 capsule by mouth daily.    ? clonazePAM (KLONOPIN) 1 MG tablet 1 qhs 30 tablet 4  ? clopidogrel (PLAVIX) 75 MG tablet TAKE 1 TABLET BY MOUTH EVERY DAY 90 tablet 3  ? Ferrous Sulfate (IRON PO) Take by mouth.    ? folic acid (FOLVITE) 1 MG tablet Take 1 tablet (1 mg total) by mouth daily. 90 tablet 3  ? glipiZIDE (GLUCOTROL XL) 2.5 MG 24 hr tablet Take 1 tablet (2.5 mg total) by mouth daily with breakfast. For diabetes. 90 tablet 3  ? guaiFENesin (MUCINEX) 600 MG 12 hr tablet Take 1 tablet (600 mg total) by mouth 2 (two) times daily.    ? Multiple Vitamin (MULTIVITAMIN) capsule Take 1 capsule by mouth daily.    ? OXYGEN 3 L continuous.     ? rosuvastatin (CRESTOR) 20 MG tablet TAKE 1 TABLET (20 MG TOTAL) BY MOUTH EVERY EVENING. FOR CHOLESTEROL. 90 tablet 3  ? tamsulosin (FLOMAX) 0.4 MG CAPS capsule Take 1 capsule (0.4 mg total) by mouth daily. For urine retention 90 capsule 1  ? Tiotropium Bromide-Olodaterol (STIOLTO RESPIMAT) 2.5-2.5 MCG/ACT AERS INHALE 2 PUFFS BY MOUTH INTO THE LUNGS DAILY 12 g 4  ? LORazepam (ATIVAN) 0.5 MG tablet 1  q am (Patient not taking: Reported on 04/04/2022) 30 tablet 4  ? vitamin B-12 1000 MCG tablet Take 1 tablet (1,000 mcg total) by mouth daily. (Patient not taking: Reported on 04/04/2022)    ? ?No facility-administered medications prior to visit.  ? ? ?Allergies  ?Allergen Reactions  ? Lexapro [Escitalopram Oxalate] Other (See Comments)  ?  Dizziness, nausea, fatigue  ? Lexapro [Escitalopram]   ? Neosporin  [Neomycin-Polymyxin-Gramicidin] Rash  ?  (Neosporin)  ? Neosporin [Bacitracin-Polymyxin B] Rash  ? ? ?ROS ?Review of Systems  ?Constitutional:  Positive for chills and fatigue. Negative for fever.  ?HENT:  Negative for congestion, ear pain, postnasal drip, rhinorrhea, sinus pressure, sinus pain, sneezing and sore throat.   ?Eyes:  Negative for discharge.  ?Respiratory:  Negative for cough, chest tightness, shortness  of breath and wheezing.   ?Cardiovascular:  Negative for chest pain and palpitations.  ?Gastrointestinal:  Negative for abdominal pain, constipation, diarrhea, nausea and vomiting.  ?Genitourinary:  Positive for decreased urine volume. Negative for difficulty urinating and urgency.  ?Musculoskeletal:  Negative for back pain.  ?Neurological:  Positive for dizziness and weakness.  ?Psychiatric/Behavioral:  Positive for confusion (increased confusion, does have h/o dementia lower than baseline) and sleep disturbance (sleeping more as of the last few weeks).   ? ?  ?Objective:  ?  ?Physical Exam ?Constitutional:   ?   General: She is not in acute distress. ?   Appearance: Normal appearance. She is not ill-appearing.  ?HENT:  ?   Right Ear: Tympanic membrane normal.  ?   Left Ear: Tympanic membrane normal.  ?   Nose: Nose normal. No congestion or rhinorrhea.  ?   Right Turbinates: Not enlarged or swollen.  ?   Left Turbinates: Not enlarged or swollen.  ?   Right Sinus: No maxillary sinus tenderness or frontal sinus tenderness.  ?   Left Sinus: No maxillary sinus tenderness or frontal sinus tenderness.  ?   Mouth/Throat:  ?   Mouth: Mucous membranes are moist.  ?   Pharynx: No pharyngeal swelling, oropharyngeal exudate or posterior oropharyngeal erythema.  ?   Tonsils: No tonsillar exudate.  ?Eyes:  ?   Extraocular Movements: Extraocular movements intact.  ?   Conjunctiva/sclera: Conjunctivae normal.  ?   Pupils: Pupils are equal, round, and reactive to light.  ?Neck:  ?   Thyroid: No thyroid mass.  ?Cardiovascular:  ?   Rate and Rhythm: Normal rate and regular rhythm.  ?Pulmonary:  ?   Effort: Pulmonary effort is normal. No accessory muscle usage, prolonged expiration or respiratory distress.  ?   Breath sounds: Examination of the right-middle field reveals wheezing. Examination of the left-middle field reveals wheezing. Examination of the right-lower field reveals decreased breath sounds, wheezing and rhonchi.  Examination of the left-lower field reveals decreased breath sounds, wheezing and rhonchi. Decreased breath sounds, wheezing and rhonchi present. No rales.  ?   Comments: On continuous oxygen ?Abdominal:  ?   [facility]

## 2022-04-04 NOTE — Progress Notes (Signed)
Recommend taking vitamin D3 2000 IU once daily  ?Anemia stable, actually improved some ?Iron on lower end, but still normal range, storage of iron (ferretin) also on lower end but normal ?Recommend MVI  with iron  ?HGA1C has increased some, follow up with PCP Anda Kraft for this.  ?B12 is good.  ?BNP negative, no CHF.  ? ?Pending results of chest xray.

## 2022-04-04 NOTE — Patient Instructions (Signed)
Stop by the lab prior to leaving today. I will notify you of your results once received.  ? ?Due to recent changes in healthcare laws, you may see results of your imaging and/or laboratory studies on MyChart before I have had a chance to review them.  I understand that in some cases there may be results that are confusing or concerning to you. Please understand that not all results are received at the same time and often I may need to interpret multiple results in order to provide you with the best plan of care or course of treatment. Therefore, I ask that you please give me 2 business days to thoroughly review all your results before contacting my office for clarification. Should we see a critical lab result, you will be contacted sooner.  ? ?It was a pleasure seeing you today! Please do not hesitate to reach out with any questions and or concerns. ? ?Regards,  ? ?Victorino Fatzinger ?FNP-C ? ?

## 2022-04-04 NOTE — Telephone Encounter (Signed)
Thank you :)

## 2022-04-04 NOTE — Assessment & Plan Note (Signed)
Ordering cbc cmp b12 folate  ?

## 2022-04-04 NOTE — Assessment & Plan Note (Signed)
Ordering cxr to r/o pneumonia ?Reviewed most recent cxr 1/23 interstitial markings vs fluid  ?bnp cbc ordered pending results ?

## 2022-04-04 NOTE — Assessment & Plan Note (Signed)
poct urine dip in office ?Urine culture ordered pending results ? ?

## 2022-04-04 NOTE — Assessment & Plan Note (Signed)
Ordered hga1c today pending results. Work on diabetic diet and exercise as tolerated. Yearly foot exam, and annual eye exam.  ?Refill for accu chek lancets and glucose test strips  ?

## 2022-04-04 NOTE — Assessment & Plan Note (Signed)
Ordering cmp pending results ?

## 2022-04-05 LAB — URINE CULTURE
MICRO NUMBER:: 13357926
Result:: NO GROWTH
SPECIMEN QUALITY:: ADEQUATE

## 2022-04-07 ENCOUNTER — Telehealth: Payer: Self-pay | Admitting: Primary Care

## 2022-04-07 NOTE — Progress Notes (Signed)
Amanda Porter, I recently saw Marlou Sa, daughter stated pt with increased fatigue and slight increase in confusion(dx of dementia, mild). CXR showed small left pleural effusion. pt does have h/o left upper lobe lung cancer. Curious if you had any recommendations for next steps? Labs unremarkable. pt with chronic dry cough, states no increased SOB DOE just increased fatigue. (CXR 04/04/22-Impression: Minimal bibasilar atelectasis/airspace disease with small left ?pleural effusion.) I also did reach out with Dr. Baird Lyons, pulmonary via staff message, pending response.  ?

## 2022-04-07 NOTE — Telephone Encounter (Signed)
Pts daughter called back and had some questions about lab results and asked for a call back at [phone] ?

## 2022-04-08 ENCOUNTER — Other Ambulatory Visit: Payer: Self-pay | Admitting: Family

## 2022-04-08 DIAGNOSIS — J90 Pleural effusion, not elsewhere classified: Secondary | ICD-10-CM | POA: Insufficient documentation

## 2022-04-08 DIAGNOSIS — R9389 Abnormal findings on diagnostic imaging of other specified body structures: Secondary | ICD-10-CM | POA: Insufficient documentation

## 2022-04-08 NOTE — Progress Notes (Signed)
FYI: ?Ordered CT chest.  ?Pending insurance.

## 2022-04-09 ENCOUNTER — Other Ambulatory Visit: Payer: Self-pay | Admitting: Primary Care

## 2022-04-09 DIAGNOSIS — E119 Type 2 diabetes mellitus without complications: Secondary | ICD-10-CM

## 2022-04-10 NOTE — Telephone Encounter (Signed)
Left message to return call to our office.  

## 2022-04-10 NOTE — Telephone Encounter (Signed)
Patient is overdue for follow-up with me.  Please schedule for late May or early June. ?

## 2022-04-16 ENCOUNTER — Emergency Department (HOSPITAL_COMMUNITY): Payer: Medicare PPO

## 2022-04-16 ENCOUNTER — Encounter (HOSPITAL_COMMUNITY): Payer: Self-pay | Admitting: Student

## 2022-04-16 ENCOUNTER — Other Ambulatory Visit: Payer: Self-pay

## 2022-04-16 ENCOUNTER — Inpatient Hospital Stay (HOSPITAL_COMMUNITY)
Admission: EM | Admit: 2022-04-16 | Discharge: 2022-04-23 | DRG: 083 | Disposition: A | Discharge status: Skilled Nursing Facility | Payer: Medicare PPO | Attending: Neurological Surgery | Admitting: Neurological Surgery

## 2022-04-16 DIAGNOSIS — J432 Centrilobular emphysema: Secondary | ICD-10-CM | POA: Diagnosis not present

## 2022-04-16 DIAGNOSIS — S0181XA Laceration without foreign body of other part of head, initial encounter: Secondary | ICD-10-CM | POA: Diagnosis present

## 2022-04-16 DIAGNOSIS — R410 Disorientation, unspecified: Secondary | ICD-10-CM | POA: Diagnosis present

## 2022-04-16 DIAGNOSIS — F0781 Postconcussional syndrome: Secondary | ICD-10-CM | POA: Diagnosis present

## 2022-04-16 DIAGNOSIS — Z751 Person awaiting admission to adequate facility elsewhere: Secondary | ICD-10-CM

## 2022-04-16 DIAGNOSIS — R8290 Unspecified abnormal findings in urine: Secondary | ICD-10-CM | POA: Diagnosis present

## 2022-04-16 DIAGNOSIS — W19XXXA Unspecified fall, initial encounter: Secondary | ICD-10-CM | POA: Diagnosis not present

## 2022-04-16 DIAGNOSIS — R2689 Other abnormalities of gait and mobility: Secondary | ICD-10-CM | POA: Diagnosis not present

## 2022-04-16 DIAGNOSIS — R5381 Other malaise: Secondary | ICD-10-CM | POA: Diagnosis present

## 2022-04-16 DIAGNOSIS — Z7401 Bed confinement status: Secondary | ICD-10-CM | POA: Diagnosis not present

## 2022-04-16 DIAGNOSIS — Z9181 History of falling: Secondary | ICD-10-CM | POA: Diagnosis not present

## 2022-04-16 DIAGNOSIS — S065X9A Traumatic subdural hemorrhage with loss of consciousness of unspecified duration, initial encounter: Secondary | ICD-10-CM | POA: Diagnosis present

## 2022-04-16 DIAGNOSIS — Z902 Acquired absence of lung [part of]: Secondary | ICD-10-CM

## 2022-04-16 DIAGNOSIS — Z7951 Long term (current) use of inhaled steroids: Secondary | ICD-10-CM | POA: Diagnosis not present

## 2022-04-16 DIAGNOSIS — E1165 Type 2 diabetes mellitus with hyperglycemia: Secondary | ICD-10-CM | POA: Diagnosis present

## 2022-04-16 DIAGNOSIS — M19012 Primary osteoarthritis, left shoulder: Secondary | ICD-10-CM | POA: Diagnosis not present

## 2022-04-16 DIAGNOSIS — G912 (Idiopathic) normal pressure hydrocephalus: Secondary | ICD-10-CM | POA: Diagnosis present

## 2022-04-16 DIAGNOSIS — Z8673 Personal history of transient ischemic attack (TIA), and cerebral infarction without residual deficits: Secondary | ICD-10-CM

## 2022-04-16 DIAGNOSIS — S0101XA Laceration without foreign body of scalp, initial encounter: Secondary | ICD-10-CM | POA: Diagnosis not present

## 2022-04-16 DIAGNOSIS — J9611 Chronic respiratory failure with hypoxia: Secondary | ICD-10-CM | POA: Diagnosis present

## 2022-04-16 DIAGNOSIS — D649 Anemia, unspecified: Secondary | ICD-10-CM | POA: Diagnosis present

## 2022-04-16 DIAGNOSIS — I6529 Occlusion and stenosis of unspecified carotid artery: Secondary | ICD-10-CM | POA: Diagnosis not present

## 2022-04-16 DIAGNOSIS — F0393 Unspecified dementia, unspecified severity, with mood disturbance: Secondary | ICD-10-CM | POA: Diagnosis present

## 2022-04-16 DIAGNOSIS — S199XXA Unspecified injury of neck, initial encounter: Secondary | ICD-10-CM | POA: Diagnosis not present

## 2022-04-16 DIAGNOSIS — Z982 Presence of cerebrospinal fluid drainage device: Secondary | ICD-10-CM | POA: Diagnosis not present

## 2022-04-16 DIAGNOSIS — S06361A Traumatic hemorrhage of cerebrum, unspecified, with loss of consciousness of 30 minutes or less, initial encounter: Principal | ICD-10-CM

## 2022-04-16 DIAGNOSIS — Z833 Family history of diabetes mellitus: Secondary | ICD-10-CM

## 2022-04-16 DIAGNOSIS — M4312 Spondylolisthesis, cervical region: Secondary | ICD-10-CM | POA: Diagnosis not present

## 2022-04-16 DIAGNOSIS — Z7902 Long term (current) use of antithrombotics/antiplatelets: Secondary | ICD-10-CM

## 2022-04-16 DIAGNOSIS — Z7983 Long term (current) use of bisphosphonates: Secondary | ICD-10-CM | POA: Diagnosis not present

## 2022-04-16 DIAGNOSIS — W1839XA Other fall on same level, initial encounter: Secondary | ICD-10-CM | POA: Diagnosis present

## 2022-04-16 DIAGNOSIS — J449 Chronic obstructive pulmonary disease, unspecified: Secondary | ICD-10-CM | POA: Diagnosis not present

## 2022-04-16 DIAGNOSIS — E119 Type 2 diabetes mellitus without complications: Secondary | ICD-10-CM | POA: Diagnosis not present

## 2022-04-16 DIAGNOSIS — F32A Depression, unspecified: Secondary | ICD-10-CM | POA: Diagnosis present

## 2022-04-16 DIAGNOSIS — F0390 Unspecified dementia without behavioral disturbance: Secondary | ICD-10-CM | POA: Diagnosis present

## 2022-04-16 DIAGNOSIS — Y92003 Bedroom of unspecified non-institutional (private) residence as the place of occurrence of the external cause: Secondary | ICD-10-CM | POA: Diagnosis not present

## 2022-04-16 DIAGNOSIS — S0993XA Unspecified injury of face, initial encounter: Secondary | ICD-10-CM | POA: Diagnosis not present

## 2022-04-16 DIAGNOSIS — S065X0A Traumatic subdural hemorrhage without loss of consciousness, initial encounter: Secondary | ICD-10-CM | POA: Diagnosis not present

## 2022-04-16 DIAGNOSIS — R41841 Cognitive communication deficit: Secondary | ICD-10-CM | POA: Diagnosis not present

## 2022-04-16 DIAGNOSIS — R1312 Dysphagia, oropharyngeal phase: Secondary | ICD-10-CM | POA: Diagnosis not present

## 2022-04-16 DIAGNOSIS — Z7984 Long term (current) use of oral hypoglycemic drugs: Secondary | ICD-10-CM | POA: Diagnosis not present

## 2022-04-16 DIAGNOSIS — R42 Dizziness and giddiness: Secondary | ICD-10-CM | POA: Diagnosis not present

## 2022-04-16 DIAGNOSIS — R0902 Hypoxemia: Secondary | ICD-10-CM | POA: Diagnosis not present

## 2022-04-16 DIAGNOSIS — F0394 Unspecified dementia, unspecified severity, with anxiety: Secondary | ICD-10-CM | POA: Diagnosis present

## 2022-04-16 DIAGNOSIS — F419 Anxiety disorder, unspecified: Secondary | ICD-10-CM | POA: Diagnosis not present

## 2022-04-16 DIAGNOSIS — S065XAD Traumatic subdural hemorrhage with loss of consciousness status unknown, subsequent encounter: Secondary | ICD-10-CM | POA: Diagnosis not present

## 2022-04-16 DIAGNOSIS — Z809 Family history of malignant neoplasm, unspecified: Secondary | ICD-10-CM

## 2022-04-16 DIAGNOSIS — Z85118 Personal history of other malignant neoplasm of bronchus and lung: Secondary | ICD-10-CM | POA: Diagnosis not present

## 2022-04-16 DIAGNOSIS — S065XAA Traumatic subdural hemorrhage with loss of consciousness status unknown, initial encounter: Secondary | ICD-10-CM | POA: Diagnosis present

## 2022-04-16 DIAGNOSIS — G319 Degenerative disease of nervous system, unspecified: Secondary | ICD-10-CM | POA: Diagnosis not present

## 2022-04-16 DIAGNOSIS — M6281 Muscle weakness (generalized): Secondary | ICD-10-CM | POA: Diagnosis not present

## 2022-04-16 DIAGNOSIS — Z9981 Dependence on supplemental oxygen: Secondary | ICD-10-CM

## 2022-04-16 DIAGNOSIS — F29 Unspecified psychosis not due to a substance or known physiological condition: Secondary | ICD-10-CM | POA: Diagnosis not present

## 2022-04-16 DIAGNOSIS — R4020 Unspecified coma: Secondary | ICD-10-CM | POA: Diagnosis not present

## 2022-04-16 DIAGNOSIS — Z79899 Other long term (current) drug therapy: Secondary | ICD-10-CM

## 2022-04-16 DIAGNOSIS — I700 Atherosclerosis of aorta: Secondary | ICD-10-CM | POA: Diagnosis not present

## 2022-04-16 DIAGNOSIS — Z87891 Personal history of nicotine dependence: Secondary | ICD-10-CM | POA: Diagnosis not present

## 2022-04-16 DIAGNOSIS — R918 Other nonspecific abnormal finding of lung field: Secondary | ICD-10-CM | POA: Diagnosis not present

## 2022-04-16 DIAGNOSIS — R000 Tachycardia, unspecified: Secondary | ICD-10-CM | POA: Diagnosis not present

## 2022-04-16 DIAGNOSIS — E785 Hyperlipidemia, unspecified: Secondary | ICD-10-CM | POA: Diagnosis present

## 2022-04-16 DIAGNOSIS — S0003XA Contusion of scalp, initial encounter: Secondary | ICD-10-CM | POA: Diagnosis not present

## 2022-04-16 DIAGNOSIS — Z9889 Other specified postprocedural states: Secondary | ICD-10-CM | POA: Diagnosis not present

## 2022-04-16 DIAGNOSIS — R262 Difficulty in walking, not elsewhere classified: Secondary | ICD-10-CM | POA: Diagnosis not present

## 2022-04-16 LAB — COMPREHENSIVE METABOLIC PANEL
ALT: 11 U/L (ref 0–44)
AST: 15 U/L (ref 15–41)
Albumin: 3.3 g/dL — ABNORMAL LOW (ref 3.5–5.0)
Alkaline Phosphatase: 58 U/L (ref 38–126)
Anion gap: 10 (ref 5–15)
BUN: 16 mg/dL (ref 8–23)
CO2: 30 mmol/L (ref 22–32)
Calcium: 9 mg/dL (ref 8.9–10.3)
Chloride: 101 mmol/L (ref 98–111)
Creatinine, Ser: 0.82 mg/dL (ref 0.44–1.00)
GFR, Estimated: >60 mL/min (ref >60)
Glucose, Bld: 194 mg/dL — ABNORMAL HIGH (ref 70–99)
Potassium: 3.7 mmol/L (ref 3.5–5.1)
Sodium: 141 mmol/L (ref 135–145)
Total Bilirubin: 0.4 mg/dL (ref 0.3–1.2)
Total Protein: 6 g/dL — ABNORMAL LOW (ref 6.5–8.1)

## 2022-04-16 LAB — CBC WITH DIFFERENTIAL/PLATELET
Abs Immature Granulocytes: 0.03 10*3/uL (ref 0.00–0.07)
Basophils Absolute: 0.1 10*3/uL (ref 0.0–0.1)
Basophils Relative: 0 %
Eosinophils Absolute: 0.1 10*3/uL (ref 0.0–0.5)
Eosinophils Relative: 1 %
HCT: 34.3 % — ABNORMAL LOW (ref 36.0–46.0)
Hemoglobin: 10.6 g/dL — ABNORMAL LOW (ref 12.0–15.0)
Immature Granulocytes: 0 %
Lymphocytes Relative: 15 %
Lymphs Abs: 1.8 10*3/uL (ref 0.7–4.0)
MCH: 29.4 pg (ref 26.0–34.0)
MCHC: 30.9 g/dL (ref 30.0–36.0)
MCV: 95.3 fL (ref 80.0–100.0)
Monocytes Absolute: 0.8 10*3/uL (ref 0.1–1.0)
Monocytes Relative: 6 %
Neutro Abs: 9.7 10*3/uL — ABNORMAL HIGH (ref 1.7–7.7)
Neutrophils Relative %: 78 %
Platelets: 287 10*3/uL (ref 150–400)
RBC: 3.6 MIL/uL — ABNORMAL LOW (ref 3.87–5.11)
RDW: 13.5 % (ref 11.5–15.5)
WBC: 12.5 10*3/uL — ABNORMAL HIGH (ref 4.0–10.5)
nRBC: 0 % (ref 0.0–0.2)

## 2022-04-16 LAB — GLUCOSE, CAPILLARY
Glucose-Capillary: 107 mg/dL — ABNORMAL HIGH (ref 70–99)
Glucose-Capillary: 126 mg/dL — ABNORMAL HIGH (ref 70–99)

## 2022-04-16 MED ORDER — ALBUTEROL SULFATE HFA 108 (90 BASE) MCG/ACT IN AERS: 1.0000 | INHALATION_SPRAY | RESPIRATORY_TRACT | Status: DC | PRN

## 2022-04-16 MED ORDER — BUPROPION HCL ER (XL) 150 MG PO TB24
150.0000 mg | ORAL_TABLET | Freq: Every day | ORAL | Status: DC
  Administered 2022-04-17 – 2022-04-23 (×7): 150 mg via ORAL
  Filled 2022-04-16 (×6): qty 1

## 2022-04-16 MED ORDER — IPRATROPIUM-ALBUTEROL 0.5-2.5 (3) MG/3ML IN SOLN
3.0000 mL | Freq: Once | RESPIRATORY_TRACT | Status: AC
  Administered 2022-04-16: 3 mL via RESPIRATORY_TRACT
  Filled 2022-04-16: qty 3

## 2022-04-16 MED ORDER — ACETAMINOPHEN 500 MG PO TABS
1000.0000 mg | ORAL_TABLET | Freq: Once | ORAL | Status: AC
  Administered 2022-04-16: 1000 mg via ORAL
  Filled 2022-04-16: qty 2

## 2022-04-16 MED ORDER — ARFORMOTEROL TARTRATE 15 MCG/2ML IN NEBU: 15.0000 ug | INHALATION_SOLUTION | Freq: Two times a day (BID) | RESPIRATORY_TRACT | Status: DC

## 2022-04-16 MED ORDER — UMECLIDINIUM BROMIDE 62.5 MCG/ACT IN AEPB
1.0000 | INHALATION_SPRAY | Freq: Every day | RESPIRATORY_TRACT | Status: DC
  Filled 2022-04-16: qty 7

## 2022-04-16 MED ORDER — FOLIC ACID 1 MG PO TABS
1.0000 mg | ORAL_TABLET | Freq: Every day | ORAL | Status: DC
  Administered 2022-04-17 – 2022-04-23 (×7): 1 mg via ORAL
  Filled 2022-04-16 (×7): qty 1

## 2022-04-16 MED ORDER — TAMSULOSIN HCL 0.4 MG PO CAPS
0.4000 mg | ORAL_CAPSULE | Freq: Every day | ORAL | Status: DC
  Administered 2022-04-17 – 2022-04-23 (×7): 0.4 mg via ORAL
  Filled 2022-04-16 (×7): qty 1

## 2022-04-16 MED ORDER — ALBUTEROL SULFATE (2.5 MG/3ML) 0.083% IN NEBU: 2.5000 mg | INHALATION_SOLUTION | Freq: Four times a day (QID) | RESPIRATORY_TRACT | Status: DC | PRN

## 2022-04-16 MED ORDER — LIDOCAINE-EPINEPHRINE (PF) 2 %-1:200000 IJ SOLN
10.0000 mL | Freq: Once | INTRAMUSCULAR | Status: AC
  Administered 2022-04-16: 10 mL via INTRADERMAL
  Filled 2022-04-16: qty 20

## 2022-04-16 MED ORDER — ONDANSETRON HCL 4 MG PO TABS: 4.0000 mg | ORAL_TABLET | Freq: Four times a day (QID) | ORAL | Status: DC | PRN

## 2022-04-16 MED ORDER — SODIUM CHLORIDE 0.9 % IV SOLN
INTRAVENOUS | Status: DC
  Filled 2022-04-16 (×3): qty 1000

## 2022-04-16 MED ORDER — ONDANSETRON HCL 4 MG/2ML IJ SOLN: 4.0000 mg | Freq: Four times a day (QID) | INTRAMUSCULAR | Status: DC | PRN

## 2022-04-16 MED ORDER — GLIPIZIDE ER 2.5 MG PO TB24
2.5000 mg | ORAL_TABLET | Freq: Every day | ORAL | Status: DC
Start: 2022-04-17 — End: 2022-04-23
  Administered 2022-04-17 – 2022-04-23 (×6): 2.5 mg via ORAL
  Filled 2022-04-16 (×8): qty 1

## 2022-04-16 MED ORDER — CLONAZEPAM 1 MG PO TABS
1.0000 mg | ORAL_TABLET | Freq: Every day | ORAL | Status: DC
  Administered 2022-04-16 – 2022-04-22 (×7): 1 mg via ORAL
  Filled 2022-04-16 (×7): qty 1

## 2022-04-16 MED ORDER — ACETAMINOPHEN 325 MG PO TABS
650.0000 mg | ORAL_TABLET | ORAL | Status: DC | PRN
  Administered 2022-04-16 – 2022-04-23 (×7): 650 mg via ORAL
  Filled 2022-04-16 (×7): qty 2

## 2022-04-16 MED ORDER — ARFORMOTEROL TARTRATE 15 MCG/2ML IN NEBU
15.0000 ug | INHALATION_SOLUTION | Freq: Two times a day (BID) | RESPIRATORY_TRACT | Status: DC
  Administered 2022-04-16 – 2022-04-22 (×12): 15 ug via RESPIRATORY_TRACT
  Filled 2022-04-16 (×14): qty 2

## 2022-04-16 MED ORDER — SODIUM CHLORIDE 0.9 % IV SOLN: INTRAVENOUS | Status: DC

## 2022-04-16 MED ORDER — INSULIN ASPART 100 UNIT/ML IJ SOLN
0.0000 [IU] | Freq: Three times a day (TID) | INTRAMUSCULAR | Status: DC
  Administered 2022-04-17 – 2022-04-21 (×2): 3 [IU] via SUBCUTANEOUS
  Administered 2022-04-21 – 2022-04-22 (×2): 2 [IU] via SUBCUTANEOUS
  Administered 2022-04-22: 3 [IU] via SUBCUTANEOUS
  Administered 2022-04-22: 2 [IU] via SUBCUTANEOUS

## 2022-04-16 MED ORDER — UMECLIDINIUM BROMIDE 62.5 MCG/ACT IN AEPB
1.0000 | INHALATION_SPRAY | Freq: Every day | RESPIRATORY_TRACT | Status: DC
Start: 2022-04-17 — End: 2022-04-23
  Administered 2022-04-17 – 2022-04-23 (×7): 1 via RESPIRATORY_TRACT
  Filled 2022-04-16: qty 7

## 2022-04-16 MED ORDER — BUSPIRONE HCL 5 MG PO TABS
30.0000 mg | ORAL_TABLET | Freq: Every day | ORAL | Status: DC
Start: 2022-04-17 — End: 2022-04-18
  Administered 2022-04-17: 30 mg via ORAL
  Filled 2022-04-16 (×2): qty 6

## 2022-04-16 NOTE — Progress Notes (Signed)
Daughter, [name] requests that she be contacted with any updates, correct phone number in the chart

## 2022-04-16 NOTE — Progress Notes (Signed)
SLP Cancellation Note ? ?Patient Details ?Name: Amanda Porter ?MRN: [id number] ?DOB: 26-Nov-1946 ? ? ?Cancelled treatment:       Reason Eval/Treat Not Completed: Patient at procedure or test/unavailable (Pt currently working with PT. SLP will follow up.) ? ?Tatisha Cerino I. Hardin Negus, [place], CCC-SLP ?Acute Rehabilitation Services ?Office number [phone] ?Pager [phone] ? ?Horton Marshall ?04/16/2022, 3:20 PM ?

## 2022-04-16 NOTE — Progress Notes (Signed)
Orthopedic Tech Progress Note ?Patient Details:  ?Dhani A Tramontana ?11-22-1946 ?[id number] ? ?Level 2 trauma  ? ?Patient ID: Amanda Porter, female   DOB: 1946-02-24, 76 y.o.   MRN: [id number] ? ?Janit Pagan ?04/16/2022, 10:24 AM ? ?

## 2022-04-16 NOTE — ED Notes (Signed)
Trauma Response Nurse Documentation ? ? ?Amanda Porter is a 76 y.o. female arriving to [facility] ED via EMS ? ?On clopidogrel 75 mg daily, per patients daughter this was being held for an LP later this week (3 skipped doses). Trauma was activated as a Level 2 based on the following trauma criteria Elderly patients > 65 with head trauma on anti-coagulation (excluding ASA). Trauma team at the bedside on patient arrival. Patient cleared for CT by Dr. Tyrone Nine. Patient to CT with team. GCS 14. ? ?Per daughter patient usually stays home by herself during the day when she is working, but she does live with her. Fall was unwitnessed but patient did call daughter 2 times to report she may have fallen and hit her head. She is able to do most ADLs, she believes she did not have her walker during this fall. Had shunt placed in 2018 at [facility] for normal pressure hydrocephalus, LP planned for later this week. ? ?History  ? Past Medical History:  ?Diagnosis Date  ? Acute respiratory failure requiring reintubation ([place]) 11/05/2021  ? Anxiety   ? Anxiety and depression   ? COPD (chronic obstructive pulmonary disease) ([place])   ? CVA [facility]cere[facility])   ? CVA (cerebral vascular accident) [facility])   ? L sided deficits  ? Dementia ([place])   ? Depression   ? Diabetes mellitus ([facility])   ? Type 2  ? Diabetes mellitus without complication ([place])   ? Dysphagia   ? Dysphasia   ? History of lung cancer   ? Hyperlipidemia   ? Memory disturbance 11/20/2017  ? Normal pressure hydrocephalus ([place]) 2018  ? Retinal detachment   ? Right  ? Right carotid bruit 11/20/2017  ? Tardive dyskinesia 02/01/2020  ? Tardive dyskinesia   ? Thrombosis   ? Arterial to lower extremity?  ?  ? Past Surgical History:  ?Procedure Laterality Date  ? LUNG REMOVAL, PARTIAL    ? left upper lobe  ? VENTRICULOPERITONEAL SHUNT  12/09/2016  ? VENTRICULOPERITONEAL SHUNT    ?  ? ?Initial Focused Assessment (If applicable, or please see trauma documentation): ?Patient A&O  but confused to events of the fall, some baseline information provided by daughter who is an EMT. ?Laceration/hematoma to left side of forehead that tracks posteriorly down the left side of patients head, bleeding controlled with gauze. ?No other traumatic injury identified, baseline use of rollater walker at home per daughter ? ?CT's Completed:   ?CT Head, CT Maxillofacial, and CT C-Spine  ? ?Interventions:  ?IV, labs ?CXR ?CT Head/Cspine, add on maxillofacial ?C-collar removed by MD ?Laceration suture ? ?Plan for disposition:  ?Admission to [facility]  ? ?Consults completed:  ?Neurosurgeon at [facility]. ? ?Event Summary: ?Patient to be admitted to SDU, CT head showing bilateral small SDH 84mm/5mm. Admit for observation/Neuro exams, hold Plavix. ? ?Bedside handoff with ED RN Marta Antu.   ? ?[place] Leeya Rusconi  ?Trauma Response RN ? ?Please call TRN at [phone] for further assistance. ?  ?

## 2022-04-16 NOTE — ED Notes (Signed)
Patient transported to CT by TRN ?

## 2022-04-16 NOTE — ED Provider Notes (Signed)
?[facility] ?Provider Note ? ? ?CSN: [id number] ?Arrival date & time: 04/16/22  1010 ? ?  ? ?History ? ?Chief Complaint  ?Patient presents with  ? Level 2  ? Fall  ? ? ?Amanda Porter is a 76 y.o. female. ? ?76 yo F with a chief complaint of a fall.  The patient thinks that she lost her balance.  She is walking without her walker and had struck the ground.  She denies any pain.  Has a laceration to the left frontal region.  Has been on Plavix for a couple days in preparation of having a lumbar puncture performed to try and draw some CSF as her VP shunt has not been working properly. ? ? ?Fall ? ? ?  ? ?Home Medications ?Prior to Admission medications   ?Medication Sig Start Date End Date Taking? Authorizing Provider  ?Accu-Chek Softclix Lancets lancets Use as instructed 04/04/22   Eugenia Pancoast, FNP  ?albuterol (PROVENTIL) (2.5 MG/3ML) 0.083% nebulizer solution USE 1 VIAL IN NEBULIZER EVERY 6 HOURS 07/20/21   Baird Lyons D, MD  ?albuterol (VENTOLIN HFA) 108 (90 Base) MCG/ACT inhaler INHALE 2 PUFFS BY MOUTH EVERY 4 HOURS AS NEEDED 06/12/21   Baird Lyons D, MD  ?alendronate (FOSAMAX) 70 MG tablet Take with a full glass of water on an empty stomach for bone density. 03/14/21   Pleas Koch, NP  ?ASHWAGANDHA PO Take 1 capsule by mouth daily as needed (anxiety).    [provider]  ?blood glucose meter kit and supplies KIT Dispense based on patient and insurance preference. Use up to four times daily as directed. (FOR ICD-9 250.00, 250.01). 10/17/21   Pleas Koch, NP  ?buPROPion (WELLBUTRIN XL) 150 MG 24 hr tablet 1 qam for 5 days then 2  qam 03/12/22   Plovsky, Berneta Sages, MD  ?busPIRone (BUSPAR) 15 MG tablet 3  qam   2 qpm 03/12/22   Plovsky, Berneta Sages, MD  ?CALCIUM PO Take 1 capsule by mouth daily.    [provider]  ?clonazePAM (KLONOPIN) 1 MG tablet 1 qhs 03/12/22   Plovsky, Berneta Sages, MD  ?clopidogrel (PLAVIX) 75 MG tablet TAKE 1 TABLET BY MOUTH EVERY DAY  04/09/21   Pleas Koch, NP  ?Ferrous Sulfate (IRON PO) Take by mouth.    [provider]  ?folic acid (FOLVITE) 1 MG tablet Take 1 tablet (1 mg total) by mouth daily. 12/13/21   Pleas Koch, NP  ?glipiZIDE (GLUCOTROL XL) 2.5 MG 24 hr tablet TAKE 1 TABLET (2.5 MG TOTAL) BY MOUTH DAILY WITH BREAKFAST. FOR DIABETES. 04/10/22   Pleas Koch, NP  ?glucose blood test strip Use as instructed 04/04/22   Eugenia Pancoast, FNP  ?guaiFENesin (MUCINEX) 600 MG 12 hr tablet Take 1 tablet (600 mg total) by mouth 2 (two) times daily. 11/11/21   Antonieta Pert, MD  ?Multiple Vitamin (MULTIVITAMIN) capsule Take 1 capsule by mouth daily.    [provider]  ?OXYGEN 3 L continuous.     [provider]  ?rosuvastatin (CRESTOR) 20 MG tablet TAKE 1 TABLET (20 MG TOTAL) BY MOUTH EVERY EVENING. FOR CHOLESTEROL. 04/09/21   Pleas Koch, NP  ?tamsulosin (FLOMAX) 0.4 MG CAPS capsule Take 1 capsule (0.4 mg total) by mouth daily. For urine retention 12/13/21   Pleas Koch, NP  ?Tiotropium Bromide-Olodaterol (STIOLTO RESPIMAT) 2.5-2.5 MCG/ACT AERS INHALE 2 PUFFS BY MOUTH INTO THE LUNGS DAILY 12/26/21   Deneise Lever, MD  ?   ? ?  Allergies    ?Lexapro [escitalopram oxalate], Lexapro [escitalopram], Neosporin  [neomycin-polymyxin-gramicidin], and Neosporin [bacitracin-polymyxin b]   ? ?Review of Systems   ?Review of Systems ? ?Physical Exam ?Updated Vital Signs ?BP 110/61   Pulse (!) 101   Temp (!) 97.1 ?F (36.2 ?C) (Temporal)   Resp (!) 23   Ht '5\' 3"'  (1.6 m)   Wt 50.8 kg   SpO2 97%   BMI 19.84 kg/m?  ?Physical Exam ?Vitals and nursing note reviewed.  ?Constitutional:   ?   General: She is not in acute distress. ?   Appearance: She is well-developed. She is not diaphoretic.  ?HENT:  ?   Head: Normocephalic.  ?   Comments: Left forehead laceration just above the brow.  Gaping.  Bleeding controlled.  No obvious facial nerve palsy.  No midline C-spine tenderness step-offs or deformities.  Able to  rotate her head 45 degrees in no direction without pain. ?Eyes:  ?   Pupils: Pupils are equal, round, and reactive to light.  ?Cardiovascular:  ?   Rate and Rhythm: Normal rate and regular rhythm.  ?   Heart sounds: No murmur heard. ?  No friction rub. No gallop.  ?Pulmonary:  ?   Effort: Pulmonary effort is normal.  ?   Breath sounds: No wheezing or rales.  ?Abdominal:  ?   General: There is no distension.  ?   Palpations: Abdomen is soft.  ?   Tenderness: There is no abdominal tenderness.  ?Musculoskeletal:     ?   General: No tenderness.  ?   Cervical back: Normal range of motion and neck supple.  ?   Comments: She has some ongoing pain to the left shoulder from a previous fall.  No obvious other bony tenderness on palpation from head to toe.  ?Skin: ?   General: Skin is warm and dry.  ?Neurological:  ?   Mental Status: She is alert and oriented to person, place, and time.  ?Psychiatric:     ?   Behavior: Behavior normal.  ? ? ?ED Results / Procedures / Treatments   ?Labs ?(all labs ordered are listed, but only abnormal results are displayed) ?Labs Reviewed  ?CBC WITH DIFFERENTIAL/PLATELET - Abnormal; Notable for the following components:  ?    Result Value  ? WBC 12.5 (*)   ? RBC 3.60 (*)   ? Hemoglobin 10.6 (*)   ? HCT 34.3 (*)   ? Neutro Abs 9.7 (*)   ? All other components within normal limits  ?COMPREHENSIVE METABOLIC PANEL - Abnormal; Notable for the following components:  ? Glucose, Bld 194 (*)   ? Total Protein 6.0 (*)   ? Albumin 3.3 (*)   ? All other components within normal limits  ? ? ?EKG ?EKG Interpretation ? ?Date/Time:  Wednesday Apr 16 2022 10:25:33 EDT ?Ventricular Rate:  103 ?PR Interval:  124 ?QRS Duration: 111 ?QT Interval:  369 ?QTC Calculation: 483 ?R Axis:   36 ?Text Interpretation: Sinus tachycardia Abnormal R-wave progression, early transition No significant change since last tracing Confirmed by Deno Etienne [phone]) on 04/16/2022 10:34:05 AM ? ?Radiology ?CT Head Wo Contrast ? ?Result  Date: 04/16/2022 ?CLINICAL DATA:  Fall. EXAM: CT HEAD WITHOUT CONTRAST CT CERVICAL SPINE WITHOUT CONTRAST TECHNIQUE: Multidetector CT imaging of the head and cervical spine was performed following the standard protocol without intravenous contrast. Multiplanar CT image reconstructions of the cervical spine were also generated. RADIATION DOSE REDUCTION: This exam was performed according to the departmental  dose-optimization program which includes automated exposure control, adjustment of the mA and/or kV according to patient size and/or use of iterative reconstruction technique. COMPARISON:  CT head dated November 10, 2021. CT head and cervical spine dated November 04, 2021. FINDINGS: CT HEAD FINDINGS Brain: Small acute left cerebral convexity subdural hematoma measuring up to 5 mm over the temporal lobe (series 3, image 16) and 3 mm along the anterior and posterior falx. Tiny acute right cerebral convexity subdural hematoma measuring up to 3 mm in diameter over the frontal lobe (series 3, image 19). No significant midline shift. Unchanged right frontal approach shunt catheter. No evidence of acute infarction, hydrocephalus, or mass lesion. Stable atrophy and chronic microvascular ischemic changes. Old right thalamic lacunar infarct again noted. Vascular: Atherosclerotic vascular calcification of the carotid siphons. No hyperdense vessel. Skull: Right frontal burr hole for shunt catheter again noted. Negative for fracture or focal lesion. Sinuses/Orbits: No acute finding. Other: Small left frontal scalp laceration and hematoma. CT CERVICAL SPINE FINDINGS Alignment: No traumatic malalignment. Unchanged trace anterolisthesis at C4-C5. Skull base and vertebrae: No acute fracture. No primary bone lesion or focal pathologic process. Soft tissues and spinal canal: No prevertebral fluid or swelling. No visible canal hematoma. Disc levels: Unchanged moderate disc height loss and uncovertebral hypertrophy in the lower cervical  spine. Upper chest: Severe centrilobular emphysema. Other: None. IMPRESSION: 1. Small acute subdural hematomas overlying both cerebral convexities and the falx. No significant midline shift. 2. Small left frontal

## 2022-04-16 NOTE — ED Notes (Signed)
Pt transported back to rnoom ?

## 2022-04-16 NOTE — Evaluation (Signed)
Physical Therapy Evaluation ?Patient Details ?Name: Amanda Porter ?MRN: [id number] ?DOB: 10-06-1946 ?Today's Date: 04/16/2022 ? ?History of Present Illness ? Pt is a 76 y/o female admitted following fall. CT showed Small acute subdural hematomas overlying both cerebral  convexities and the falx. PMH includes CVA, COPD, s/p VP shunt, dementia, TD, and DM.  ?Clinical Impression ? Pt admitted secondary to problem above with deficits below. Pt with increased confusion and difficulty sequencing. Requiring min A for steadying and assist to manage RW as pt running into objects on both sides. Per pt's daughter, pt with increased unsteadiness and concerned about pt returning home as she does not have assist during the day. Recommending SNF level therapies to increase independence and safety. Will continue to follow acutely.    ?   ? ?Recommendations for follow up therapy are one component of a multi-disciplinary discharge planning process, led by the attending physician.  Recommendations may be updated based on patient status, additional functional criteria and insurance authorization. ? ?Follow Up Recommendations Skilled nursing-short term rehab (<3 hours/day) ? ?  ?Assistance Recommended at Discharge Frequent or constant Supervision/Assistance  ?Patient can return home with the following ? A little help with walking and/or transfers;A little help with bathing/dressing/bathroom;Assistance with cooking/housework;Direct supervision/assist for medications management;Direct supervision/assist for financial management;Assist for transportation;Help with stairs or ramp for entrance ? ?  ?Equipment Recommendations None recommended by PT  ?Recommendations for Other Services ?    ?  ?Functional Status Assessment Patient has had a recent decline in their functional status and demonstrates the ability to make significant improvements in function in a reasonable and predictable amount of time.  ? ?  ?Precautions / Restrictions  Precautions ?Precautions: Fall ?Restrictions ?Weight Bearing Restrictions: No  ? ?  ? ?Mobility ? Bed Mobility ?Overal bed mobility: Needs Assistance ?Bed Mobility: Supine to Sit, Sit to Supine ?  ?  ?Supine to sit: Min assist ?Sit to supine: Min guard ?  ?General bed mobility comments: Min A for trunk elevation to come to sitting. ?  ? ?Transfers ?Overall transfer level: Needs assistance ?Equipment used: Rolling walker (2 wheels) ?Transfers: Sit to/from Stand ?Sit to Stand: Min assist ?  ?  ?  ?  ?  ?General transfer comment: Min A for steadying. Cues to wait for PT as pt slightly impulsive ?  ? ?Ambulation/Gait ?Ambulation/Gait assistance: Min assist ?Gait Distance (Feet): 30 Feet ?Assistive device: Rolling walker (2 wheels) ?Gait Pattern/deviations: Step-through pattern, Decreased stride length, Narrow base of support ?Gait velocity: Decreased ?  ?  ?General Gait Details: Only able to tolerate short distance using RW. Running into objects on R and L with RW and requiring assist with appropriate RW management. Pt with increased dizziness which limited mobility tolerance. REquiring increased time to follow directional cues. ? ?Stairs ?  ?  ?  ?  ?  ? ?Wheelchair Mobility ?  ? ?Modified Rankin (Stroke Patients Only) ?  ? ?  ? ?Balance Overall balance assessment: Needs assistance ?Sitting-balance support: No upper extremity supported, Feet supported ?Sitting balance-Leahy Scale: Fair ?  ?  ?Standing balance support: Bilateral upper extremity supported ?Standing balance-Leahy Scale: Poor ?Standing balance comment: Reliant on BUE and external support ?  ?  ?  ?  ?  ?  ?  ?  ?  ?  ?  ?   ? ? ? ?Pertinent Vitals/Pain Pain Assessment ?Pain Assessment: Faces ?Faces Pain Scale: Hurts little more ?Pain Location: head ?Pain Descriptors / Indicators: Headache ?Pain Intervention(s): Limited  activity within patient's tolerance, Monitored during session, Repositioned  ? ? ?Home Living Family/patient expects to be discharged to::  Private residence ?Living Arrangements: Children ?Available Help at Discharge: Family;Available PRN/intermittently ?Type of Home: House ?Home Access: Level entry ?  ?  ?  ?Home Layout: One level ?Home Equipment: Rollator (4 wheels);Shower seat ?Additional Comments: info provided by daughter. Daughter works as EMT so pt is alone during the day  ?  ?Prior Function Prior Level of Function : Needs assist ?  ?  ?  ?  ?  ?  ?Mobility Comments: Daughter reports pt supposed to be using rollator, but does not use as she should ?ADLs Comments: Daughter reports pt is mod I with basic ADLs. sponge bathes unless daughter is there to supervise shower transfer. ?  ? ? ?Hand Dominance  ?   ? ?  ?Extremity/Trunk Assessment  ? Upper Extremity Assessment ?Upper Extremity Assessment: Defer to OT evaluation ?  ? ?Lower Extremity Assessment ?Lower Extremity Assessment: Generalized weakness ?  ? ?Cervical / Trunk Assessment ?Cervical / Trunk Assessment: Normal  ?Communication  ? Communication: No difficulties  ?Cognition Arousal/Alertness: Awake/alert ?Behavior During Therapy: [facility] for tasks assessed/performed ?Overall Cognitive Status: Impaired/Different from baseline ?Area of Impairment: Memory, Safety/judgement, Following commands, Awareness, Problem solving ?  ?  ?  ?  ?  ?  ?  ?  ?  ?  ?Memory: Decreased short-term memory, Decreased recall of precautions ?Following Commands: Follows one step commands with increased time ?Safety/Judgement: Decreased awareness of deficits, Decreased awareness of safety ?Awareness: Intellectual ?Problem Solving: Slow processing, Difficulty sequencing ?General Comments: Pt's daughter reports pt with increased confusion. Difficulty sequencing and running into things with RW. Pt with slow processing and requiring increased time to follow commands. ?  ?  ? ?  ?General Comments General comments (skin integrity, edema, etc.): Pt's daughter present and with concerns about pt going home ? ?  ?Exercises     ? ?Assessment/Plan  ?  ?PT Assessment Patient needs continued PT services  ?PT Problem List Decreased strength;Decreased range of motion;Decreased activity tolerance;Decreased mobility;Decreased balance;Decreased knowledge of use of DME;Decreased knowledge of precautions;Decreased cognition;Decreased safety awareness ? ?   ?  ?PT Treatment Interventions DME instruction;Gait training;Stair training;Functional mobility training;Therapeutic activities;Therapeutic exercise;Cognitive remediation   ? ?PT Goals (Current goals can be found in the Care Plan section)  ?Acute Rehab PT Goals ?Patient Stated Goal: for pt to get stronger before going home ?PT Goal Formulation: With patient/family ?Time For Goal Achievement: 04/30/22 ?Potential to Achieve Goals: Good ? ?  ?Frequency Min 2X/week ?  ? ? ?Co-evaluation   ?  ?  ?  ?  ? ? ?  ?AM-PAC PT "6 Clicks" Mobility  ?Outcome Measure Help needed turning from your back to your side while in a flat bed without using bedrails?: A Little ?Help needed moving from lying on your back to sitting on the side of a flat bed without using bedrails?: A Little ?Help needed moving to and from a bed to a chair (including a wheelchair)?: A Little ?Help needed standing up from a chair using your arms (e.g., wheelchair or bedside chair)?: A Little ?Help needed to walk in hospital room?: A Little ?Help needed climbing 3-5 steps with a railing? : A Lot ?6 Click Score: 17 ? ?  ?End of Session Equipment Utilized During Treatment: Gait belt ?Activity Tolerance: Treatment limited secondary to medical complications (Comment) (dizziness) ?Patient left: in bed;with call bell/phone within reach;with bed alarm set;with family/visitor present ?Nurse  Communication: Mobility status ?PT Visit Diagnosis: Unsteadiness on feet (R26.81);Muscle weakness (generalized) (M62.81) ?  ? ?Time: 3403-7096 ?PT Time Calculation (min) (ACUTE ONLY): 22 min ? ? ?Charges:   PT Evaluation ?$PT Eval Moderate Complexity: 1 Mod ?  ?   ?   ? ? ?Reuel Derby, PT, DPT  ?Acute Rehabilitation Services  ?Pager: [phone] ?Office: [phone] ? ? ?[place] ?04/16/2022, 3:55 PM ? ?

## 2022-04-16 NOTE — ED Notes (Signed)
Patient transported to CT 

## 2022-04-16 NOTE — H&P (Signed)
? ?Amanda Porter is an 76 y.o. female.  ? ?HPI:  ?76 year old female presented to the ED today after sustaining a fall and hitting her head on a dresser. No family at the bedside at this moment. She has a very extensive medical history inclufind CVA, COPD, VP shunt for NPH(managed by [facility]), TD, and dementia. Per family she was started on plavix 2 days and her neurosurgeon was planning on tapping her shunt because they were having difficulty programming it and getting it to drain. Patient denies any HA NV dizziness or vision changes ? ?Past Medical History:  ?Diagnosis Date  ? Acute respiratory failure requiring reintubation ([place]) 11/05/2021  ? Anxiety   ? Anxiety and depression   ? COPD (chronic obstructive pulmonary disease) ([place])   ? CVA (cerebral vascular accident) [facility])   ? CVA (cerebral vascular accident) [facility])   ? L sided deficits  ? Dementia ([place])   ? Depression   ? Diabetes mellitus ([place])   ? Type 2  ? Diabetes mellitus without complication ([place])   ? Dysphagia   ? Dysphasia   ? History of lung cancer   ? Hyperlipidemia   ? Memory disturbance 11/20/2017  ? Normal pressure hydrocephalus ([place]) 2018  ? Retinal detachment   ? Right  ? Right carotid bruit 11/20/2017  ? Tardive dyskinesia 02/01/2020  ? Tardive dyskinesia   ? Thrombosis   ? Arterial to lower extremity?  ?  ?Past Surgical History:  ?Procedure Laterality Date  ? LUNG REMOVAL, PARTIAL    ? left upper lobe  ? VENTRICULOPERITONEAL SHUNT  12/09/2016  ? VENTRICULOPERITONEAL SHUNT    ?  ?Allergies  ?Allergen Reactions  ? Lexapro [Escitalopram Oxalate] Other (See Comments)  ?  Dizziness, nausea, fatigue  ? Lexapro [Escitalopram]   ? Neosporin  [Neomycin-Polymyxin-Gramicidin] Rash  ?  (Neosporin)  ? Neosporin [Bacitracin-Polymyxin B] Rash  ?  ?Social History  ? ?Tobacco Use  ? Smoking status: Former  ?  Packs/day: 0.50  ?  Years: 50.00  ?  Pack years: 25.00  ?  Types: Cigarettes  ?  Quit date: 08/01/2017  ?  Years since quitting: 4.7  ? Smokeless tobacco:  Never  ?Substance Use Topics  ? Alcohol use: Not Currently  ?  ?Family History  ?Problem Relation Age of Onset  ? Pneumonia Mother   ? Cancer Father   ? Diabetes Sister   ? Diabetes Sister   ? Breast cancer Neg Hx   ? ?  ?Review of Systems ? ?Positive ROS: as above ? ?All other systems have been reviewed and were otherwise negative with the exception of those mentioned in the HPI and as above. ? ?Objective: ?Vital signs in last 24 hours: ?Temp:  [97.1 ?F (36.2 ?C)] 97.1 ?F (36.2 ?C) (05/17 1019) ?Pulse Rate:  [95-109] 101 (05/17 1140) ?Resp:  [15-23] 23 (05/17 1140) ?BP: (95-123)/(45-85) 110/61 (05/17 1140) ?SpO2:  [72 %-100 %] 97 % (05/17 1140) ?Weight:  [50.8 kg] 50.8 kg (05/17 1018) ? ?General Appearance: Alert, cooperative, no distress, appears stated age ?Head: Normocephalic, without obvious abnormality, abrasions on forehead ?Eyes: PERRL, conjunctiva/corneas clear, EOM's intact, fundi benign, both eyes      ?Neck: Supple, symmetrical, trachea midline, no adenopathy; thyroid: No enlargement/tenderness/nodules; no carotid bruit or JVD ?Lungs: respirations unlabored ?Heart: Regular rate and rhythm ?Extremities: Extremities normal, atraumatic, no cyanosis or edema ?Pulses: 2+ and symmetric all extremities ?Skin: Skin color, texture, turgor normal, no rashes or lesions ? ?NEUROLOGIC:  ? ?Mental  status: A&O x1, no aphasia, good attention span, Memory and fund of knowledge impairee ?Motor Exam - grossly normal, normal tone and bulk ?Sensory Exam - grossly normal ?Reflexes: symmetric, no pathologic reflexes, No Hoffman's, No clonus ?Coordination - not tested ?Gait - not tested ?Balance - not tested ?Cranial Nerves: ?I: smell Not tested  ?II: visual acuity  OS: na    OD: na  ?II: visual fields Full to confrontation  ?II: pupils Equal, round, reactive to light  ?III,VII: ptosis None  ?III,IV,VI: extraocular muscles  Full ROM  ?V: mastication Normal  ?V: facial light touch sensation  Normal  ?V,VII: corneal reflex   Present  ?VII: facial muscle function - upper  Normal  ?VII: facial muscle function - lower Normal  ?VIII: hearing Not tested  ?IX: soft palate elevation  Normal  ?IX,X: gag reflex Present  ?XI: trapezius strength  5/5  ?XI: sternocleidomastoid strength 5/5  ?XI: neck flexion strength  5/5  ?XII: tongue strength  Normal  ? ? ?Data Review ?Lab Results  ?Component Value Date  ? WBC 12.5 (H) 04/16/2022  ? HGB 10.6 (L) 04/16/2022  ? HCT 34.3 (L) 04/16/2022  ? MCV 95.3 04/16/2022  ? PLT 287 04/16/2022  ? ?Lab Results  ?Component Value Date  ? NA 141 04/16/2022  ? K 3.7 04/16/2022  ? CL 101 04/16/2022  ? CO2 30 04/16/2022  ? BUN 16 04/16/2022  ? CREATININE 0.82 04/16/2022  ? GLUCOSE 194 (H) 04/16/2022  ? ?Lab Results  ?Component Value Date  ? INR 1.1 11/04/2021  ? ? ?Radiology: CT Head Wo Contrast ? ?Result Date: 04/16/2022 ?CLINICAL DATA:  Fall. EXAM: CT HEAD WITHOUT CONTRAST CT CERVICAL SPINE WITHOUT CONTRAST TECHNIQUE: Multidetector CT imaging of the head and cervical spine was performed following the standard protocol without intravenous contrast. Multiplanar CT image reconstructions of the cervical spine were also generated. RADIATION DOSE REDUCTION: This exam was performed according to the departmental dose-optimization program which includes automated exposure control, adjustment of the mA and/or kV according to patient size and/or use of iterative reconstruction technique. COMPARISON:  CT head dated November 10, 2021. CT head and cervical spine dated November 04, 2021. FINDINGS: CT HEAD FINDINGS Brain: Small acute left cerebral convexity subdural hematoma measuring up to 5 mm over the temporal lobe (series 3, image 16) and 3 mm along the anterior and posterior falx. Tiny acute right cerebral convexity subdural hematoma measuring up to 3 mm in diameter over the frontal lobe (series 3, image 19). No significant midline shift. Unchanged right frontal approach shunt catheter. No evidence of acute infarction,  hydrocephalus, or mass lesion. Stable atrophy and chronic microvascular ischemic changes. Old right thalamic lacunar infarct again noted. Vascular: Atherosclerotic vascular calcification of the carotid siphons. No hyperdense vessel. Skull: Right frontal burr hole for shunt catheter again noted. Negative for fracture or focal lesion. Sinuses/Orbits: No acute finding. Other: Small left frontal scalp laceration and hematoma. CT CERVICAL SPINE FINDINGS Alignment: No traumatic malalignment. Unchanged trace anterolisthesis at C4-C5. Skull base and vertebrae: No acute fracture. No primary bone lesion or focal pathologic process. Soft tissues and spinal canal: No prevertebral fluid or swelling. No visible canal hematoma. Disc levels: Unchanged moderate disc height loss and uncovertebral hypertrophy in the lower cervical spine. Upper chest: Severe centrilobular emphysema. Other: None. IMPRESSION: 1. Small acute subdural hematomas overlying both cerebral convexities and the falx. No significant midline shift. 2. Small left frontal scalp laceration and hematoma. 3. No acute cervical spine fracture or traumatic listhesis.  4. Emphysema (ICD10-J43.9). Critical Value/emergent results were called by telephone at the time of interpretation on 04/16/2022 at 11:17 am to provider DAN FLOYD , who verbally acknowledged these results. Electronically Signed   By: Titus Dubin M.D.   On: 04/16/2022 11:34  ? ?CT Cervical Spine Wo Contrast ? ?Result Date: 04/16/2022 ?CLINICAL DATA:  Fall. EXAM: CT HEAD WITHOUT CONTRAST CT CERVICAL SPINE WITHOUT CONTRAST TECHNIQUE: Multidetector CT imaging of the head and cervical spine was performed following the standard protocol without intravenous contrast. Multiplanar CT image reconstructions of the cervical spine were also generated. RADIATION DOSE REDUCTION: This exam was performed according to the departmental dose-optimization program which includes automated exposure control, adjustment of the mA  and/or kV according to patient size and/or use of iterative reconstruction technique. COMPARISON:  CT head dated November 10, 2021. CT head and cervical spine dated November 04, 2021. FINDINGS: CT HEAD FINDINGS Brain: Small

## 2022-04-17 ENCOUNTER — Observation Stay (HOSPITAL_COMMUNITY): Payer: Medicare PPO

## 2022-04-17 DIAGNOSIS — S0003XA Contusion of scalp, initial encounter: Secondary | ICD-10-CM | POA: Diagnosis not present

## 2022-04-17 DIAGNOSIS — Z982 Presence of cerebrospinal fluid drainage device: Secondary | ICD-10-CM | POA: Diagnosis not present

## 2022-04-17 DIAGNOSIS — S065X0A Traumatic subdural hemorrhage without loss of consciousness, initial encounter: Secondary | ICD-10-CM | POA: Diagnosis not present

## 2022-04-17 LAB — GLUCOSE, CAPILLARY
Glucose-Capillary: 86 mg/dL (ref 70–99)
Glucose-Capillary: 158 mg/dL — ABNORMAL HIGH (ref 70–99)
Glucose-Capillary: 84 mg/dL (ref 70–99)
Glucose-Capillary: 140 mg/dL — ABNORMAL HIGH (ref 70–99)

## 2022-04-17 NOTE — Care Management Obs Status (Signed)
[place] NOTIFICATION   Patient Details  Name: Amanda Porter MRN: [id number] Date of Birth: 1946/02/12   Medicare Observation Status Notification Given:  Yes    Verdell Carmine, RN 04/17/2022, 4:36 PM

## 2022-04-17 NOTE — Evaluation (Signed)
Speech Language Pathology Evaluation Patient Details Name: Amanda Porter MRN: [id number] DOB: 1946/10/28 Today's Date: 04/17/2022 Time: 3009-2330 SLP Time Calculation (min) (ACUTE ONLY): 18 min  Problem List:  Patient Active Problem List   Diagnosis Date Noted   Subdural hematoma ([place]) 04/16/2022   Abnormal chest x-ray 04/08/2022   Pleural effusion on left 04/08/2022   Wheezing 04/04/2022   Decreased urine output 04/04/2022   Vitamin D deficiency 04/04/2022   Other fatigue 04/04/2022   Hypokalemia 04/04/2022   Influenza A 12/13/2021   Urinary retention 12/13/2021   Wrist fracture, closed, left, sequela 12/13/2021   Protein-calorie malnutrition, severe 11/07/2021   Tardive dyskinesia 02/01/2020   Lightheadedness 09/05/2019   Cervical spondylolysis 05/24/2019   Chronic respiratory failure with hypoxia ([place]) 02/25/2019   Postural dizziness 08/11/2018   Iron deficiency anemia 07/02/2018   Anemia 04/12/2018   Type 2 diabetes mellitus without complication, without long-term current use of insulin ([place]) 02/18/2018   Pain of left lower extremity 11/26/2017   Memory disturbance 11/20/2017   Right carotid bruit 11/20/2017   Hyperlipidemia 09/14/2017   COPD mixed type ([place]) 09/14/2017   Dementia without behavioral disturbance ([place]) 09/14/2017   Normal pressure hydrocephalus ([place]) 09/14/2017   Dysphagia 09/14/2017   Essential hypertension 09/14/2017   History of CVA (cerebrovascular accident) 09/14/2017   Anxiety and depression 09/14/2017   Tobacco use disorder 12/10/2016   Past Medical History:  Past Medical History:  Diagnosis Date   Acute respiratory failure requiring reintubation ([place]) 11/05/2021   Anxiety    Anxiety and depression    COPD (chronic obstructive pulmonary disease) ([place])    CVA (cerebral vascular accident) ([place])    CVA (cerebral vascular accident) ([place])    L sided deficits   Dementia ([place])    Depression    Diabetes mellitus ([place])    Type 2   Diabetes  mellitus without complication ([place])    Dysphagia    Dysphasia    History of lung cancer    Hyperlipidemia    Memory disturbance 11/20/2017   Normal pressure hydrocephalus ([place]) 2018   Retinal detachment    Right   Right carotid bruit 11/20/2017   Tardive dyskinesia 02/01/2020   Tardive dyskinesia    Thrombosis    Arterial to lower extremity?   Past Surgical History:  Past Surgical History:  Procedure Laterality Date   LUNG REMOVAL, PARTIAL     left upper lobe   VENTRICULOPERITONEAL SHUNT  12/09/2016   VENTRICULOPERITONEAL SHUNT     HPI:  Pt is a 76 year old female who presented to the ED today after sustaining a fall and hitting her head on a dresser. CT head 5/17: Small acute subdural hematomas overlying both cerebral convexities and the falx. Small left frontal scalp laceration and hematoma. Neurosurgery consulted and surgical intervention not warranted. PMH: CVA, COPD, VP shunt for NPH(managed by [facility]), TD, dysphagia, memory distrubance, and dementia.   Assessment / Plan / Recommendation Clinical Impression  Pt participated in speech-language-cognition evaluation. Pt's daughter reported that the pt's baseline cognitive impairments were "mild" with difficulty with short term memory, processing speed, and problem solving. Pt denied any baseline or acute impairments in speech, language, or cognition. Pt's daughter reported that the pt's cognition is notably more impaired compared to baseline. Pt's motor speech skills were WNL and language was [facility], with some impairments in auditory comprehension due to cognitive impairments. The [facility] Mental Status Examination was completed to evaluate the pt's cognitive-linguistic skills. She achieved a score  of 5/30 which is below the normal limits of 27 or more out of 30 and falls within the range for individuals with dementia. She exhibited difficulty in the areas of awareness, sustained attention, memory, complex problem solving, and  executive function. Skilled SLP services are clinically indicated at this time to improve motor speech and cognitive-linguistic function.    SLP Assessment  SLP Recommendation/Assessment: Patient needs continued Speech [place] Pathology Services SLP Visit Diagnosis: Cognitive communication deficit (R41.841)    Recommendations for follow up therapy are one component of a multi-disciplinary discharge planning process, led by the attending physician.  Recommendations may be updated based on patient status, additional functional criteria and insurance authorization.    Follow Up Recommendations  Skilled nursing-short term rehab (<3 hours/day)    Assistance Recommended at Discharge  Frequent or constant Supervision/Assistance  Functional Status Assessment Patient has had a recent decline in their functional status and demonstrates the ability to make significant improvements in function in a reasonable and predictable amount of time.  Frequency and Duration min 2x/week  2 weeks      SLP Evaluation Cognition  Overall Cognitive Status: Impaired/Different from baseline Arousal/Alertness: Awake/alert Orientation Level: Oriented to person;Oriented to place;Oriented to situation;Disoriented to time Year:  (5465) Month: June Day of Week: Incorrect Attention: Focused;Sustained Focused Attention: Appears intact Sustained Attention: Impaired Sustained Attention Impairment: Verbal complex Memory: Impaired Memory Impairment: Storage deficit;Retrieval deficit;Decreased short term memory (Immediate: 5/5 with repetition x4; delayed: 1/5 with cues: 0/4) Awareness: Impaired Awareness Impairment: Emergent impairment Problem Solving: Impaired Problem Solving Impairment: Verbal complex Executive Function: Sequencing;Organizing Sequencing: Impaired Sequencing Impairment: Verbal complex (clock: 0/4) Organizing: Impaired Organizing Impairment: Verbal complex (backward digit span:0/2)        Comprehension  Auditory Comprehension Overall Auditory Comprehension: Appears within functional limits for tasks assessed Yes/No Questions: Within Functional Limits Commands: Within Functional Limits Conversation: Simple    Expression Expression Primary Mode of Expression: Verbal Verbal Expression Overall Verbal Expression: Appears within functional limits for tasks assessed Initiation: No impairment Level of Generative/Spontaneous Verbalization: Conversation Repetition: No impairment Naming: No impairment Pragmatics: No impairment   Oral / Motor  Oral Motor/Sensory Function Overall Oral Motor/Sensory Function: Within functional limits Motor Speech Overall Motor Speech: Appears within functional limits for tasks assessed Respiration: Within functional limits Phonation: Normal Resonance: Within functional limits Articulation: Within functional limitis Intelligibility: Intelligible Motor Planning: Witnin functional limits Motor Speech Errors: Not applicable           Toussaint Golson I. Hardin Negus, [place], [place] Office number [phone] Pager [place] 04/17/2022, 11:24 AM

## 2022-04-17 NOTE — Evaluation (Signed)
Occupational Therapy Evaluation Patient Details Name: Amanda Porter MRN: [id number] DOB: March 20, 1946 Today's Date: 04/17/2022   History of Present Illness Pt is a 76 y/o female admitted following fall. CT showed Small acute subdural hematomas overlying both cerebral  convexities and the falx. PMH includes CVA, COPD, s/p VP shunt, dementia, TD, and DM.   Clinical Impression   PTA pt lives with her daughter and is alone during the day while daughter works. Pt demonstrates a functional decline, is a fall risk and would need 24/7 S in order to DC home. Given decline from baseline, feel pt would benefit from rehab at SNF to facilitate safe DC home. SpO2 @ 100 at end of session on 3L. Acute OT to follow.      Recommendations for follow up therapy are one component of a multi-disciplinary discharge planning process, led by the attending physician.  Recommendations may be updated based on patient status, additional functional criteria and insurance authorization.   Follow Up Recommendations  Skilled nursing-short term rehab (<3 hours/day)    Assistance Recommended at Discharge Frequent or constant Supervision/Assistance  Patient can return home with the following A little help with walking and/or transfers;A little help with bathing/dressing/bathroom;Assistance with cooking/housework;Direct supervision/assist for medications management;Direct supervision/assist for financial management;Assist for transportation;Help with stairs or ramp for entrance    Functional Status Assessment  Patient has had a recent decline in their functional status and demonstrates the ability to make significant improvements in function in a reasonable and predictable amount of time.  Equipment Recommendations  BSC/3in1    Recommendations for Other Services       Precautions / Restrictions Precautions Precautions: Fall Restrictions Weight Bearing Restrictions: No      Mobility Bed Mobility Overal bed mobility:  Needs Assistance Bed Mobility: Supine to Sit, Sit to Supine     Supine to sit: Min assist Sit to supine: Min guard   General bed mobility comments: Min A for trunk elevation to come to sitting.    Transfers Overall transfer level: Needs assistance Equipment used: Rolling walker (2 wheels) Transfers: Sit to/from Stand Sit to Stand: Min assist           General transfer comment: Min A for steadying. Cues to wait for PT as pt slightly impulsive      Balance Overall balance assessment: Needs assistance Sitting-balance support: No upper extremity supported, Feet supported Sitting balance-Leahy Scale: Fair     Standing balance support: Bilateral upper extremity supported Standing balance-Leahy Scale: Poor Standing balance comment: Reliant on BUE and external support                           ADL either performed or assessed with clinical judgement   ADL Overall ADL's : Needs assistance/impaired     Grooming: Set up;Supervision/safety   Upper Body Bathing: Set up;Supervision/ safety   Lower Body Bathing: Min guard   Upper Body Dressing : Set up;Supervision/safety   Lower Body Dressing: Minimal assistance;Sit to/from stand   Toilet Transfer: Minimal assistance;BSC/3in1   Toileting- Clothing Manipulation and Hygiene: Minimal assistance       Functional mobility during ADLs: Minimal assistance;Rolling walker (2 wheels) General ADL Comments: cues to remember to pull up her underwear; incontinenet at night per nsg     Vision   Additional Comments: will assess     Perception     Praxis      Pertinent Vitals/Pain Pain Assessment Pain Assessment: No/denies pain  Hand Dominance Right   Extremity/Trunk Assessment Upper Extremity Assessment Upper Extremity Assessment: Generalized weakness   Lower Extremity Assessment Lower Extremity Assessment: Defer to PT evaluation   Cervical / Trunk Assessment Cervical / Trunk Assessment: Normal    Communication Communication Communication: No difficulties   Cognition Arousal/Alertness: Awake/alert Behavior During Therapy: WFL for tasks assessed/performed Overall Cognitive Status: Impaired/Different from baseline Area of Impairment: Memory, Safety/judgement, Following commands, Awareness, Problem solving, Orientation, Attention                 Orientation Level: Disoriented to, Place, Time [employer]; April") Current Attention Level: Sustained Memory: Decreased short-term memory, Decreased recall of precautions Following Commands: Follows one step commands consistently Safety/Judgement: Decreased awareness of deficits, Decreased awareness of safety Awareness: Intellectual Problem Solving: Slow processing, Difficulty sequencing General Comments: Pt's daughter reports pt with increased confusion. Difficulty sequencing and running into things with RW. Pt with slow processing and requiring increased time to follow commands.PT attempting to go back to bed with her underwear around her knees; both legs were over the bed rail becuase she had to "pee"     General Comments       Exercises     Shoulder Instructions      Home Living Family/patient expects to be discharged to:: Private residence Living Arrangements: Children Available Help at Discharge: Family;Available PRN/intermittently Type of Home: House Home Access: Level entry     Home Layout: One level     Bathroom Shower/Tub: Teacher, early years/pre: Standard Bathroom Accessibility: Yes   Home Equipment: Rollator (4 wheels);Shower seat   Additional Comments: info provided by daughter. Daughter works as EMT so pt is alone during the day  Lives With: Daughter    Prior Functioning/Environment Prior Level of Function : Needs assist             Mobility Comments: Daughter reports pt supposed to be using rollator, but does not use as she should ADLs Comments: Daughter reports pt is mod I with  basic ADLs. sponge bathes unless daughter is there to supervise shower transfer.        OT Problem List: Decreased strength;Decreased activity tolerance;Impaired balance (sitting and/or standing);Decreased cognition;Decreased safety awareness;Decreased knowledge of use of DME or AE;Cardiopulmonary status limiting activity      OT Treatment/Interventions:      OT Goals(Current goals can be found in the care plan section) Acute Rehab OT Goals Patient Stated Goal: to go to the bathroom OT Goal Formulation: Patient unable to participate in goal setting Time For Goal Achievement: 05/01/22 Potential to Achieve Goals: Good  OT Frequency:      Co-evaluation              AM-PAC OT "6 Clicks" Daily Activity     Outcome Measure Help from another person eating meals?: None Help from another person taking care of personal grooming?: A Little Help from another person toileting, which includes using toliet, bedpan, or urinal?: A Little Help from another person bathing (including washing, rinsing, drying)?: A Little Help from another person to put on and taking off regular upper body clothing?: A Little Help from another person to put on and taking off regular lower body clothing?: A Little 6 Click Score: 19   End of Session Equipment Utilized During Treatment: Rolling walker (2 wheels);Oxygen (3L) Nurse Communication: Mobility status  Activity Tolerance: Patient tolerated treatment well Patient left: in bed;with call bell/phone within reach;with bed alarm set  OT Visit Diagnosis: Unsteadiness on feet (  R26.81);Other abnormalities of gait and mobility (R26.89);Muscle weakness (generalized) (M62.81);Other symptoms and signs involving cognitive function                Time: 4859-2763 OT Time Calculation (min): 16 min Charges:  OT General Charges $OT Visit: 1 Visit OT Evaluation $OT Eval Moderate Complexity: [place], OT/L   Acute OT Clinical Specialist Acute Rehabilitation  Services Pager [phone] Office [phone]   [facility] 04/17/2022, 1:59 PM

## 2022-04-17 NOTE — Telephone Encounter (Signed)
App made not until late June patient is in hospital now and daughter requested later in June.

## 2022-04-17 NOTE — TOC CAGE-AID Note (Signed)
Transition of Care [facility]) - CAGE-AID Screening   Patient Details  Name: Amanda Porter MRN: [id number] Date of Birth: [birth year]  [facility] of [facility]) CM/SW Contact:    Sarahann Horrell C Tarpley-Carter, [place] Phone Number: 04/17/2022, 8:57 AM   Clinical Narrative: Pt is unable to participate in Cage Aid. Pt is experiencing dementia.  Akila Batta Tarpley-Carter, MSW, LCSW-A Pronouns:  She/Her/Hers [facility] HealthTransitions of Care Clinical Social Worker Direct Number:  [phone] [email or web address]  CAGE-AID Screening: Substance Abuse Screening unable to be completed due to: : Patient unable to participate

## 2022-04-17 NOTE — Progress Notes (Signed)
Patient is awake and alert.  She is disoriented and does know she is in the hospital but thinks it is in [place], knows that she lives with her daughter but does not know her daughter's name here at the bedside, is moving all extremities, serration on the forehead looks good, CT scan reviewed and is stable with tiny bilateral subdural blood and inner falcine blood without mass effect or shift.  Daughter is requesting skilled nursing facility placement.  This is also mentioned by the physical therapist.  We will put in consult with transition of care team for that

## 2022-04-17 NOTE — Progress Notes (Signed)
Mobility Specialist: Progress Note   04/17/22 1045  Mobility  Activity Ambulated with assistance in hallway  Level of Assistance Contact guard assist, steadying assist  Assistive Device Front wheel walker  Distance Ambulated (ft) 180 ft  Activity Response Tolerated well  $Mobility charge 1 Mobility   Pt received in the chair and agreeable to ambulation. Ambulated on 3 L/min [place]. C/o mild dizziness during session. Unable to get accurate SpO2 reading post-mobility. Pt back to the chair after session with call bell in reach. Chair alarm is on.   [facility] Amanda Porter Mobility Specialist Mobility Specialist 5 North: [phone] Mobility Specialist 6 North: [phone]

## 2022-04-17 NOTE — Progress Notes (Signed)
[facility]) Hospital Liaison note:  This patient is currently enrolled in [facility] outpatient-based Palliative Care. Will continue to follow for disposition.  Please call with any outpatient palliative questions or concerns.  Thank you, Lorelee Market, LPN [facility] Liaison [phone]

## 2022-04-18 DIAGNOSIS — F0394 Unspecified dementia, unspecified severity, with anxiety: Secondary | ICD-10-CM | POA: Diagnosis present

## 2022-04-18 DIAGNOSIS — Z79899 Other long term (current) drug therapy: Secondary | ICD-10-CM | POA: Diagnosis not present

## 2022-04-18 DIAGNOSIS — D649 Anemia, unspecified: Secondary | ICD-10-CM | POA: Diagnosis present

## 2022-04-18 DIAGNOSIS — F419 Anxiety disorder, unspecified: Secondary | ICD-10-CM | POA: Diagnosis not present

## 2022-04-18 DIAGNOSIS — R5381 Other malaise: Secondary | ICD-10-CM | POA: Diagnosis present

## 2022-04-18 DIAGNOSIS — Z85118 Personal history of other malignant neoplasm of bronchus and lung: Secondary | ICD-10-CM | POA: Diagnosis not present

## 2022-04-18 DIAGNOSIS — Z8673 Personal history of transient ischemic attack (TIA), and cerebral infarction without residual deficits: Secondary | ICD-10-CM | POA: Diagnosis not present

## 2022-04-18 DIAGNOSIS — Z809 Family history of malignant neoplasm, unspecified: Secondary | ICD-10-CM | POA: Diagnosis not present

## 2022-04-18 DIAGNOSIS — Z87891 Personal history of nicotine dependence: Secondary | ICD-10-CM | POA: Diagnosis not present

## 2022-04-18 DIAGNOSIS — Z7983 Long term (current) use of bisphosphonates: Secondary | ICD-10-CM | POA: Diagnosis not present

## 2022-04-18 DIAGNOSIS — F0781 Postconcussional syndrome: Secondary | ICD-10-CM | POA: Diagnosis present

## 2022-04-18 DIAGNOSIS — Z9981 Dependence on supplemental oxygen: Secondary | ICD-10-CM | POA: Diagnosis not present

## 2022-04-18 DIAGNOSIS — Z833 Family history of diabetes mellitus: Secondary | ICD-10-CM | POA: Diagnosis not present

## 2022-04-18 DIAGNOSIS — Z751 Person awaiting admission to adequate facility elsewhere: Secondary | ICD-10-CM | POA: Diagnosis not present

## 2022-04-18 DIAGNOSIS — Z7902 Long term (current) use of antithrombotics/antiplatelets: Secondary | ICD-10-CM | POA: Diagnosis not present

## 2022-04-18 DIAGNOSIS — S065XAA Traumatic subdural hemorrhage with loss of consciousness status unknown, initial encounter: Secondary | ICD-10-CM | POA: Diagnosis not present

## 2022-04-18 DIAGNOSIS — F0393 Unspecified dementia, unspecified severity, with mood disturbance: Secondary | ICD-10-CM | POA: Diagnosis present

## 2022-04-18 DIAGNOSIS — Z7951 Long term (current) use of inhaled steroids: Secondary | ICD-10-CM | POA: Diagnosis not present

## 2022-04-18 DIAGNOSIS — R410 Disorientation, unspecified: Secondary | ICD-10-CM | POA: Diagnosis present

## 2022-04-18 DIAGNOSIS — Y92003 Bedroom of unspecified non-institutional (private) residence as the place of occurrence of the external cause: Secondary | ICD-10-CM | POA: Diagnosis not present

## 2022-04-18 DIAGNOSIS — Z7984 Long term (current) use of oral hypoglycemic drugs: Secondary | ICD-10-CM | POA: Diagnosis not present

## 2022-04-18 DIAGNOSIS — S0181XA Laceration without foreign body of other part of head, initial encounter: Secondary | ICD-10-CM | POA: Diagnosis present

## 2022-04-18 DIAGNOSIS — S065X9A Traumatic subdural hemorrhage with loss of consciousness of unspecified duration, initial encounter: Secondary | ICD-10-CM | POA: Diagnosis present

## 2022-04-18 DIAGNOSIS — E785 Hyperlipidemia, unspecified: Secondary | ICD-10-CM | POA: Diagnosis present

## 2022-04-18 DIAGNOSIS — W1839XA Other fall on same level, initial encounter: Secondary | ICD-10-CM | POA: Diagnosis present

## 2022-04-18 DIAGNOSIS — E1165 Type 2 diabetes mellitus with hyperglycemia: Secondary | ICD-10-CM | POA: Diagnosis present

## 2022-04-18 DIAGNOSIS — R8290 Unspecified abnormal findings in urine: Secondary | ICD-10-CM | POA: Diagnosis not present

## 2022-04-18 DIAGNOSIS — J9611 Chronic respiratory failure with hypoxia: Secondary | ICD-10-CM | POA: Diagnosis present

## 2022-04-18 DIAGNOSIS — Z902 Acquired absence of lung [part of]: Secondary | ICD-10-CM | POA: Diagnosis not present

## 2022-04-18 DIAGNOSIS — Z982 Presence of cerebrospinal fluid drainage device: Secondary | ICD-10-CM | POA: Diagnosis not present

## 2022-04-18 LAB — GLUCOSE, CAPILLARY
Glucose-Capillary: 94 mg/dL (ref 70–99)
Glucose-Capillary: 120 mg/dL — ABNORMAL HIGH (ref 70–99)
Glucose-Capillary: 92 mg/dL (ref 70–99)
Glucose-Capillary: 86 mg/dL (ref 70–99)

## 2022-04-18 MED ORDER — BUSPIRONE HCL 5 MG PO TABS
45.0000 mg | ORAL_TABLET | Freq: Every day | ORAL | Status: DC
  Administered 2022-04-19 – 2022-04-23 (×5): 45 mg via ORAL
  Filled 2022-04-18 (×5): qty 9

## 2022-04-18 MED ORDER — BUSPIRONE HCL 5 MG PO TABS
30.0000 mg | ORAL_TABLET | Freq: Every day | ORAL | Status: DC
  Administered 2022-04-18 – 2022-04-22 (×5): 30 mg via ORAL
  Filled 2022-04-18 (×6): qty 6

## 2022-04-18 NOTE — Progress Notes (Signed)
Mobility Specialist: Progress Note   04/18/22 1002  Mobility  Activity Ambulated with assistance in hallway  Level of Assistance Minimal assist, patient does 75% or more  Assistive Device Front wheel walker  Distance Ambulated (ft) 100 ft  Activity Response Tolerated well  $Mobility charge 1 Mobility   Post-Mobility: 94% SpO2  Pt received in the chair and agreeable to ambulation. Ambulated on 4 L/min [place]. Required increased verbal cues and physical assist for RW management/safety this session as pt ran into objects when exiting the room as well as in the hallway. Pt back to the chair after session with call bell at her side. Chair alarm is on.   [facility] Printice Hellmer Mobility Specialist Mobility Specialist 5 North: [phone] Mobility Specialist 6 North: [phone]

## 2022-04-18 NOTE — Progress Notes (Signed)
Patient ID: Amanda Porter, female   DOB: 25-Jul-1946, 76 y.o.   MRN: [id number] BP (!) 145/53 (BP Location: Right Arm)   Pulse 82   Temp 97.7 F (36.5 C) (Oral)   Resp 15   Ht 5\' 3"  (1.6 m)   Wt 50.8 kg   SpO2 100%   BMI 19.84 kg/m  Alert, confused Will follow some commands Moving extremities Waiting on placement

## 2022-04-18 NOTE — Progress Notes (Signed)
Patient daughter wants to be informed about the process of transition of care team and if her mom will become inpatient due to medicare concerns. Also states home med buspar is given 45 mg to the patient in the morning and 30 mg to the patient at night. MD notified via secure chat. WCTM.   Shelbie Proctor, RN

## 2022-04-19 LAB — GLUCOSE, CAPILLARY
Glucose-Capillary: 111 mg/dL — ABNORMAL HIGH (ref 70–99)
Glucose-Capillary: 111 mg/dL — ABNORMAL HIGH (ref 70–99)
Glucose-Capillary: 116 mg/dL — ABNORMAL HIGH (ref 70–99)
Glucose-Capillary: 134 mg/dL — ABNORMAL HIGH (ref 70–99)

## 2022-04-19 NOTE — Progress Notes (Signed)
Neurosurgery Service Progress Note  Subjective: No acute events overnight, no new complaints this morning, no headache   Objective: Vitals:   04/18/22 1937 04/18/22 2057 04/19/22 0015 04/19/22 0326  BP: (!) 145/53  117/64 (!) 113/59  Pulse: 82  85 98  Resp:   18 20  Temp: (!) 97.5 F (36.4 C)  98 F (36.7 C) 98 F (36.7 C)  TempSrc: Oral  Oral Oral  SpO2: 100% 99% 100% 99%  Weight:      Height:        Physical Exam: Aox1 ([name], 1973), face symmetric, FC with strength 5/5x4  Assessment & Plan: 76 y.o. woman s/p fall w/ small b/l SDH.  -SNF placement pending, no change in plan of care  Amanda Porter  04/19/22 8:05 AM

## 2022-04-20 ENCOUNTER — Inpatient Hospital Stay (HOSPITAL_COMMUNITY): Payer: Medicare PPO

## 2022-04-20 LAB — GLUCOSE, CAPILLARY
Glucose-Capillary: 111 mg/dL — ABNORMAL HIGH (ref 70–99)
Glucose-Capillary: 166 mg/dL — ABNORMAL HIGH (ref 70–99)
Glucose-Capillary: 104 mg/dL — ABNORMAL HIGH (ref 70–99)
Glucose-Capillary: 129 mg/dL — ABNORMAL HIGH (ref 70–99)

## 2022-04-20 NOTE — NC FL2 (Signed)
[place] MEDICAID FL2 LEVEL OF CARE SCREENING TOOL     IDENTIFICATION  Patient Name: Amanda Porter Birthdate: 1946/09/22 Sex: female Admission Date (Current Location): 04/16/2022  [facility] and [place] Number:  Herbalist and Address:  [facility], [place] [address] [place], [place] [address]      Provider Number: [id number]  Attending Physician Name and Address:  Eustace Moore, MD  Relative Name and Phone Number:  Delyla Sandeen, sister, [phone]    Current Level of Care: Hospital Recommended Level of Care: [place] Prior Approval Number:    Date Approved/Denied:   PASRR Number: [id number]  Discharge Plan: SNF    Current Diagnoses: Patient Active Problem List   Diagnosis Date Noted   Subdural hematoma ([place]) 04/16/2022   Abnormal chest x-ray 04/08/2022   Pleural effusion on left 04/08/2022   Wheezing 04/04/2022   Decreased urine output 04/04/2022   Vitamin D deficiency 04/04/2022   Other fatigue 04/04/2022   Hypokalemia 04/04/2022   Influenza A 12/13/2021   Urinary retention 12/13/2021   Wrist fracture, closed, left, sequela 12/13/2021   Protein-calorie malnutrition, severe 11/07/2021   Tardive dyskinesia 02/01/2020   Lightheadedness 09/05/2019   Cervical spondylolysis 05/24/2019   Chronic respiratory failure with hypoxia ([place]) 02/25/2019   Postural dizziness 08/11/2018   Iron deficiency anemia 07/02/2018   Anemia 04/12/2018   Type 2 diabetes mellitus without complication, without long-term current use of insulin ([place]) 02/18/2018   Pain of left lower extremity 11/26/2017   Memory disturbance 11/20/2017   Right carotid bruit 11/20/2017   Hyperlipidemia 09/14/2017   COPD mixed type ([place]) 09/14/2017   Dementia without behavioral disturbance ([place]) 09/14/2017   Normal pressure hydrocephalus ([place]) 09/14/2017   Dysphagia 09/14/2017   Essential hypertension 09/14/2017   History of CVA (cerebrovascular accident)  09/14/2017   Anxiety and depression 09/14/2017   Tobacco use disorder 12/10/2016    Orientation RESPIRATION BLADDER Height & Weight     Self  O2 (nasaul cannula 2 l/min) External catheter Weight: 112 lb (50.8 kg) Height:  5\' 3"  (160 cm)  BEHAVIORAL SYMPTOMS/MOOD NEUROLOGICAL BOWEL NUTRITION STATUS      Continent    AMBULATORY STATUS COMMUNICATION OF NEEDS Skin   Limited Assist Verbally                         Personal Care Assistance Level of Assistance  Bathing, Feeding, Dressing Bathing Assistance: Limited assistance Feeding assistance: Limited assistance Dressing Assistance: Limited assistance     Functional Limitations Info             SPECIAL CARE FACTORS FREQUENCY  PT (By licensed PT), OT (By licensed OT)     PT Frequency: 5x weekly OT Frequency: 5x weekly            Contractures      Additional Factors Info  Allergies, Code Status Code Status Info: full Allergies Info: lexapro, neosporin           Current Medications (04/20/2022):  This is the current hospital active medication list Current Facility-Administered Medications  Medication Dose Route Frequency Provider Last Rate Last Admin   acetaminophen (TYLENOL) tablet 650 mg  650 mg Oral Q4H PRN Eustace Moore, MD   650 mg at 04/19/22 1716   albuterol (PROVENTIL) (2.5 MG/3ML) 0.083% nebulizer solution 2.5 mg  2.5 mg Nebulization Q6H PRN Eustace Moore, MD       arformoterol [facility]) nebulizer solution  15 mcg  15 mcg Nebulization BID Eustace Moore, MD   15 mcg at 04/20/22 7939   And   umeclidinium bromide (INCRUSE ELLIPTA) 62.5 MCG/ACT 1 puff  1 puff Inhalation Daily Eustace Moore, MD   1 puff at 04/20/22 0818   buPROPion (WELLBUTRIN XL) 24 hr tablet 150 mg  150 mg Oral Daily Eustace Moore, MD   150 mg at 04/20/22 0300   busPIRone (BUSPAR) tablet 30 mg  30 mg Oral QHS Ashok Pall, MD   30 mg at 04/19/22 2121   busPIRone (BUSPAR) tablet 45 mg  45 mg Oral Q breakfast Ashok Pall, MD   45 mg  at 04/20/22 0818   clonazePAM (KLONOPIN) tablet 1 mg  1 mg Oral QHS Eustace Moore, MD   1 mg at 92/33/00 7622   folic acid (FOLVITE) tablet 1 mg  1 mg Oral Daily Eustace Moore, MD   1 mg at 04/20/22 0818   glipiZIDE (GLUCOTROL XL) 24 hr tablet 2.5 mg  2.5 mg Oral Q breakfast Eustace Moore, MD   2.5 mg at 04/20/22 6333   insulin aspart (novoLOG) injection 0-15 Units  0-15 Units Subcutaneous TID WC Eustace Moore, MD   3 Units at 04/17/22 1129   ondansetron (ZOFRAN) tablet 4 mg  4 mg Oral Q6H PRN Eustace Moore, MD       Or   ondansetron [facility]) injection 4 mg  4 mg Intravenous Q6H PRN Eustace Moore, MD       sodium chloride 0.9 % 1,000 mL with potassium chloride 10 mEq infusion   Intravenous Continuous Eustace Moore, MD 50 mL/hr at 04/17/22 1500 Infusion Verify at 04/17/22 1500   tamsulosin (FLOMAX) capsule 0.4 mg  0.4 mg Oral Daily Eustace Moore, MD   0.4 mg at 04/20/22 5456     Discharge Medications: Please see discharge summary for a list of discharge medications.  Relevant Imaging Results:  Relevant Lab Results:   Additional Information [id number]  Alfredia Ferguson, LCSW

## 2022-04-20 NOTE — TOC Initial Note (Signed)
Transition of Care [facility]) - Initial/Assessment Note    Patient Details  Name: Amanda Porter MRN: [id number] Date of Birth: 1946-01-13  Transition of Care [facility]) CM/SW Contact:    Alfredia Ferguson, LCSW Phone Number: 04/20/2022, 9:51 AM  Clinical Narrative:                 CSW received consult for SNF placement after PT recommendation for inpatient physical rehabilitation. CSW reviewed chart and noted fluctuating orientation and ongoing confusion. CSW spoke with patient's family (daughter) . Per family they are open to SNF placement and expressed no preferences for facility. CSW will reach out to family when bed offers are made.   Expected Discharge Plan: Skilled Nursing Facility Barriers to Discharge: SNF Pending bed offer   Patient Goals and CMS Choice Patient states their goals for this hospitalization and ongoing recovery are:: Patient disoriented. Family involved and wanting physical rehab and urine testing CMS Medicare.gov Compare Post Acute Care list provided to:: Patient Represenative (must comment) Choice offered to / list presented to : Adult Children  Expected Discharge Plan and Services Expected Discharge Plan: [place] Choice: [place] arrangements for the past 2 months: Single Family Home                                      Prior Living Arrangements/Services Living arrangements for the past 2 months: Single Family Home   Patient language and need for interpreter reviewed:: Yes        Need for Family Participation in Patient Care: Yes (Comment) Care giver support system in place?: Yes (comment)   Criminal Activity/Legal Involvement Pertinent to Current Situation/Hospitalization: No - Comment as needed  Activities of Daily Living      Permission Sought/Granted Permission sought to share information with : Chartered certified accountant granted to share information with : Yes, Verbal  Permission Granted  Share Information with NAME: for referral purposes           Emotional Assessment Appearance:: Appears stated age     Orientation: : Fluctuating Orientation (Suspected and/or reported [place]) Alcohol / Substance Use: Not Applicable Psych Involvement: No (comment)  Admission diagnosis:  Subdural hematoma ([place]) [S06.5XAA] Traumatic intracerebral hemorrhage with loss of consciousness of 30 minutes or less, unspecified laterality, initial encounter ([place]) [S06.361A] Patient Active Problem List   Diagnosis Date Noted   Subdural hematoma ([place]) 04/16/2022   Abnormal chest x-ray 04/08/2022   Pleural effusion on left 04/08/2022   Wheezing 04/04/2022   Decreased urine output 04/04/2022   Vitamin D deficiency 04/04/2022   Other fatigue 04/04/2022   Hypokalemia 04/04/2022   Influenza A 12/13/2021   Urinary retention 12/13/2021   Wrist fracture, closed, left, sequela 12/13/2021   Protein-calorie malnutrition, severe 11/07/2021   Tardive dyskinesia 02/01/2020   Lightheadedness 09/05/2019   Cervical spondylolysis 05/24/2019   Chronic respiratory failure with hypoxia ([place]) 02/25/2019   Postural dizziness 08/11/2018   Iron deficiency anemia 07/02/2018   Anemia 04/12/2018   Type 2 diabetes mellitus without complication, without long-term current use of insulin ([place]) 02/18/2018   Pain of left lower extremity 11/26/2017   Memory disturbance 11/20/2017   Right carotid bruit 11/20/2017   Hyperlipidemia 09/14/2017   COPD mixed type ([place]) 09/14/2017   Dementia without behavioral disturbance ([place]) 09/14/2017   Normal pressure hydrocephalus ([place]) 09/14/2017   Dysphagia 09/14/2017  Essential hypertension 09/14/2017   History of CVA (cerebrovascular accident) 09/14/2017   Anxiety and depression 09/14/2017   Tobacco use disorder 12/10/2016   PCP:  Pleas Koch, NP Pharmacy:   [facility], [place] [address] [place] [address] Phone: [phone] Fax: [phone]  [place], [place]. [place]. [place] [address] Phone: [phone] Fax: [phone]  [id number] [place] [address] [place] [address] Phone: [phone] Fax: [phone]     Social Determinants of Health (SDOH) Interventions    Readmission Risk Interventions     View : No data to display.

## 2022-04-20 NOTE — Care Management (Addendum)
Pateint daughter who is POA called in  to verbalize she is upset that she did not get notified  of OBS status and IM notice. She states her mother is confused an not able to sign notice. Used [employer] apologized for any communication deficit. Explained notices to daughter Tache Bobst. Im notice in room, will reprint OBS notice, even though the patient is now inpatient. Patient was alert, responsive to self  and CM and was appropriate in conversation when obs notice given. Did not ask date or time of patient.

## 2022-04-20 NOTE — Progress Notes (Signed)
Patient ID: Amanda Porter, female   DOB: February 15, 1946, 76 y.o.   MRN: [id number] BP (!) 137/56 (BP Location: Left Arm)   Pulse 85   Temp 98.4 F (36.9 C) (Oral)   Resp 16   Ht 5\' 3"  (1.6 m)   Wt 50.8 kg   SpO2 97%   BMI 19.84 kg/m  Alert, oriented to self, year, and being in a hospital. Follows commands, moving all extremities Complaining of left shoulder pain, will xray

## 2022-04-20 NOTE — Progress Notes (Signed)
Mobility Specialist Progress Note   04/20/22 1138  Mobility  Activity Ambulated with assistance in hallway  Level of Assistance Minimal assist, patient does 75% or more  Assistive Device Front wheel walker  Distance Ambulated (ft) 220 ft  Activity Response Tolerated well  $Mobility charge 1 Mobility   Pre Mobility: 95% SpO2 During Mobility: 89% SpO2 Post Mobility:  94% SpO2  Received in bed c/o a constant HA and L shoulder pain but agreeable. Requiring minA to get EOB d/t pain in L shoulder but contact guard for remainder of session. Also required increased verbal cues for RW management/safety this session as pt was on path with objects in hallway unknowingly. Returned back to bed w/o fault, placed call bell in reach and met all needs.    Amanda Porter Mobility Specialist Phone Number [phone]

## 2022-04-20 NOTE — Progress Notes (Signed)
Patient ID: Amanda Porter, female   DOB: November 07, 1946, 76 y.o.   MRN: [id number] Called daughter's number no answer left message on machine. [place]

## 2022-04-21 LAB — BASIC METABOLIC PANEL
Anion gap: 5 (ref 5–15)
BUN: 10 mg/dL (ref 8–23)
CO2: 33 mmol/L — ABNORMAL HIGH (ref 22–32)
Calcium: 9.5 mg/dL (ref 8.9–10.3)
Chloride: 103 mmol/L (ref 98–111)
Creatinine, Ser: 0.56 mg/dL (ref 0.44–1.00)
GFR, Estimated: >60 mL/min (ref >60)
Glucose, Bld: 130 mg/dL — ABNORMAL HIGH (ref 70–99)
Potassium: 3.7 mmol/L (ref 3.5–5.1)
Sodium: 141 mmol/L (ref 135–145)

## 2022-04-21 LAB — URINALYSIS, ROUTINE W REFLEX MICROSCOPIC
Bilirubin Urine: NEGATIVE
Glucose, UA: NEGATIVE mg/dL
Hgb urine dipstick: NEGATIVE
Ketones, ur: NEGATIVE mg/dL
Nitrite: NEGATIVE
Protein, ur: NEGATIVE mg/dL
Specific Gravity, Urine: 1.012 (ref 1.005–1.030)
pH: 7 (ref 5.0–8.0)

## 2022-04-21 LAB — GLUCOSE, CAPILLARY
Glucose-Capillary: 132 mg/dL — ABNORMAL HIGH (ref 70–99)
Glucose-Capillary: 114 mg/dL — ABNORMAL HIGH (ref 70–99)
Glucose-Capillary: 156 mg/dL — ABNORMAL HIGH (ref 70–99)
Glucose-Capillary: 126 mg/dL — ABNORMAL HIGH (ref 70–99)

## 2022-04-21 NOTE — Clinical Note (Incomplete)
Patients daughter Aryianna Earwood called this RN wanting to know if the Nurse Practitioner from dayshift ordered her mothers CBC, BMP and UA labwork. RN stated that there was a UA and BMP collected but there was not a CBC done at that time. Daughter asked this RN why there was not a CBC collected as well. This RN could not confirm the previous conversation between the NP and dayshift RN as to why there was not a CBC ordered. Patients daughter stated she is "dissatisfied with the care her mother has been receiving and wishing to speak with the director'. This RN gave the daughter the

## 2022-04-21 NOTE — Progress Notes (Signed)
Subjective: Patient reports doing well, no headaches, MAE well  Objective: Vital signs in last 24 hours: Temp:  [97.8 F (36.6 C)-99 F (37.2 C)] 97.8 F (36.6 C) (05/22 0749) Pulse Rate:  [85-109] 96 (05/22 0908) Resp:  [14-18] 18 (05/22 0908) BP: (109-159)/(48-93) 154/76 (05/22 0749) SpO2:  [91 %-98 %] 98 % (05/22 0908)  Intake/Output from previous day: 05/21 0701 - 05/22 0700 In: 720 [P.O.:720] Out: -  Intake/Output this shift: No intake/output data recorded.  Neurologic: Grossly normal  Lab Results: Lab Results  Component Value Date   WBC 12.5 (H) 04/16/2022   HGB 10.6 (L) 04/16/2022   HCT 34.3 (L) 04/16/2022   MCV 95.3 04/16/2022   PLT 287 04/16/2022   Lab Results  Component Value Date   INR 1.1 11/04/2021   BMET Lab Results  Component Value Date   NA 141 04/16/2022   K 3.7 04/16/2022   CL 101 04/16/2022   CO2 30 04/16/2022   GLUCOSE 194 (H) 04/16/2022   BUN 16 04/16/2022   CREATININE 0.82 04/16/2022   CALCIUM 9.0 04/16/2022    Studies/Results: DG Shoulder Left  Result Date: 04/20/2022 CLINICAL DATA:  Left shoulder pain EXAM: LEFT SHOULDER - 2+ VIEW COMPARISON:  11/07/2021 FINDINGS: Bones are mildly demineralized. There is no evidence of fracture or dislocation. Mild AC arthropathy. Glenohumeral joint space appears preserved. Soft tissues are unremarkable. Thoracic aortic atherosclerosis. IMPRESSION: Negative. Electronically Signed   By: Davina Poke D.O.   On: 04/20/2022 16:51    Assessment/Plan: S/p SDH after fall. Awaiting SNF placement. Ok to discharge when bed available. Continue therapy   LOS: 3 days    Ocie Cornfield Fabrice Dyal 04/21/2022, 10:08 AM

## 2022-04-21 NOTE — Progress Notes (Signed)
Spoke with daughter on the phone. She is insisting that she have a UA and labwork because of her confusion. The patient has been confused since the day she was admitted to the hospital. None of that has changed. At this point I think most of her confusion is coming from delirium because she is 76 years old and been in the hospital for almost a week. We see this a lot in older patients who are admitted to the hospital. I discussed this with the daughter and she still insists on labs and UA so we will order this.

## 2022-04-21 NOTE — TOC Progression Note (Signed)
Transition of Care [facility]) - Progression Note    Patient Details  Name: Amanda Porter MRN: [id number] Date of Birth: [birth year]  Transition of Care [facility] CM/SW [place], [place] Phone Number: 04/21/2022, 11:38 AM  Clinical Narrative:    CSW spoke with pt's dtr and provided bed offers. Family would like [facility]. CSW will start auth with MD update and new PT note. TOC will continue to follow for DC.    Expected Discharge Plan: Skilled Nursing Facility Barriers to Discharge: SNF Pending bed offer  Expected Discharge Plan and Services Expected Discharge Plan: [place] Choice: [place] arrangements for the past 2 months: Single Family Home                                       Social Determinants of Health (SDOH) Interventions    Readmission Risk Interventions     View : No data to display.

## 2022-04-21 NOTE — Progress Notes (Signed)
Physical Therapy Treatment Patient Details Name: Amanda Porter MRN: [id number] DOB: 06-17-1946 Today's Date: 04/21/2022   History of Present Illness Pt is a 76 y/o female admitted following fall. CT showed Small acute subdural hematomas overlying both cerebral  convexities and the falx. PMH includes CVA, COPD, s/p VP shunt, dementia, TD, and DM.    PT Comments    Pt received in bed and agreeable to ambulation, daughter present during session. Daughter relays concerns that pt has L frozen shoulder since wrist fx in 1/23. Pt with discomfort L shoulder with PROM and very limited motion. Needs further evaluation. Pt progressing with mobility. Ambulated 300' with RW and min A due to running into objects R side. Needed verbal cues to correct initially but began correcting without vc's with distance. Pt took 3 standing rest breaks with ambulation. PT will continue to follow.   Recommendations for follow up therapy are one component of a multi-disciplinary discharge planning process, led by the attending physician.  Recommendations may be updated based on patient status, additional functional criteria and insurance authorization.  Follow Up Recommendations  Skilled nursing-short term rehab (<3 hours/day)     Assistance Recommended at Discharge Frequent or constant Supervision/Assistance  Patient can return home with the following A little help with walking and/or transfers;A little help with bathing/dressing/bathroom;Assistance with cooking/housework;Direct supervision/assist for medications management;Direct supervision/assist for financial management;Assist for transportation;Help with stairs or ramp for entrance   Equipment Recommendations  None recommended by PT    Recommendations for Other Services       Precautions / Restrictions Precautions Precautions: Fall Restrictions Weight Bearing Restrictions: No     Mobility  Bed Mobility Overal bed mobility: Needs Assistance Bed Mobility:  Supine to Sit     Supine to sit: Supervision     General bed mobility comments: pt able to come to EOB without physical assist    Transfers Overall transfer level: Needs assistance Equipment used: Rolling walker (2 wheels) Transfers: Sit to/from Stand Sit to Stand: Min guard           General transfer comment: vc's for hand placement    Ambulation/Gait Ambulation/Gait assistance: Min assist Gait Distance (Feet): 300 Feet Assistive device: Rolling walker (2 wheels) Gait Pattern/deviations: Step-through pattern, Decreased stride length, Narrow base of support, Drifts right/left Gait velocity: Decreased Gait velocity interpretation: <1.8 ft/sec, indicate of risk for recurrent falls   General Gait Details: pt reported dizziness initially but did not worsen with distance. Took 3 standing rest breaks. Began running into obstacles R side and needed cues to correct, then began correcting on her own.   Stairs             Wheelchair Mobility    Modified Rankin (Stroke Patients Only)       Balance Overall balance assessment: Needs assistance Sitting-balance support: No upper extremity supported, Feet supported Sitting balance-Leahy Scale: Fair     Standing balance support: Bilateral upper extremity supported Standing balance-Leahy Scale: Poor Standing balance comment: Reliant on BUE and external support                            Cognition Arousal/Alertness: Awake/alert Behavior During Therapy: WFL for tasks assessed/performed Overall Cognitive Status: Impaired/Different from baseline Area of Impairment: Memory, Safety/judgement, Following commands, Awareness, Problem solving, Orientation, Attention                 Orientation Level: Disoriented to, Place, Time Current Attention Level: Sustained Memory:  Decreased short-term memory, Decreased recall of precautions Following Commands: Follows one step commands consistently Safety/Judgement:  Decreased awareness of deficits, Decreased awareness of safety Awareness: Intellectual Problem Solving: Slow processing, Difficulty sequencing General Comments: pt needed cues to avoid objects on R side but began to self correct as she continued to walk        Exercises      General Comments General comments (skin integrity, edema, etc.): pt with limited motion L shoulder and daughter relays concerns about frozen shoulder since wrist fx in 1/23. Performed mild stretching for L shoulder flexion with pt in standing. Discussed using heat before stretching with daughter      Pertinent Vitals/Pain Pain Assessment Pain Assessment: No/denies pain    Home Living                          Prior Function            PT Goals (current goals can now be found in the care plan section) Acute Rehab PT Goals Patient Stated Goal: for pt to get stronger before going home PT Goal Formulation: With patient/family Time For Goal Achievement: 04/30/22 Potential to Achieve Goals: Good Progress towards PT goals: Progressing toward goals    Frequency    Min 2X/week      PT Plan Current plan remains appropriate    Co-evaluation              AM-PAC PT "6 Clicks" Mobility   Outcome Measure  Help needed turning from your back to your side while in a flat bed without using bedrails?: A Little Help needed moving from lying on your back to sitting on the side of a flat bed without using bedrails?: A Little Help needed moving to and from a bed to a chair (including a wheelchair)?: A Little Help needed standing up from a chair using your arms (e.g., wheelchair or bedside chair)?: A Little Help needed to walk in hospital room?: A Little Help needed climbing 3-5 steps with a railing? : A Lot 6 Click Score: 17    End of Session Equipment Utilized During Treatment: Gait belt Activity Tolerance: Patient tolerated treatment well Patient left: with call bell/phone within reach;in  chair;with nursing/sitter in room (NT present to assist with lunch) Nurse Communication: Mobility status PT Visit Diagnosis: Unsteadiness on feet (R26.81);Muscle weakness (generalized) (M62.81)     Time: 2542-7062 PT Time Calculation (min) (ACUTE ONLY): 20 min  Charges:  $Gait Training: 8-22 mins                     Leighton Roach, [place]  Pager [phone] Office [place] 04/21/2022, 2:05 PM

## 2022-04-21 NOTE — Care Management Important Message (Signed)
Important Message  Patient Details  Name: Amanda Porter MRN: [id number] Date of Birth: 1946/05/05   Medicare Important Message Given:  Yes     Charizma Gardiner Montine Circle 04/21/2022, 3:41 PM

## 2022-04-21 NOTE — Progress Notes (Addendum)
Patients daughter Tanay Massiah called this RN wanting to know if the Nurse Practitioner from [facility] ordered her mothers CBC, BMP and UA labwork. RN stated that there was a UA and BMP collected but there was not a CBC done at that time. Daughter asked this RN why there was not a CBC collected as well. This RN could not confirm the previous conversation between the NP and [facility] RN as to why there was not a CBC ordered. Patients daughter stated she is "dissatisfied with the care her mother has been receiving and the push back she has been receiving". Lastly, patients daughter wanted this units directors number to further express concern, RN provided number.

## 2022-04-22 DIAGNOSIS — Z982 Presence of cerebrospinal fluid drainage device: Secondary | ICD-10-CM

## 2022-04-22 DIAGNOSIS — R8290 Unspecified abnormal findings in urine: Secondary | ICD-10-CM | POA: Diagnosis not present

## 2022-04-22 DIAGNOSIS — G912 (Idiopathic) normal pressure hydrocephalus: Secondary | ICD-10-CM

## 2022-04-22 DIAGNOSIS — F32A Depression, unspecified: Secondary | ICD-10-CM

## 2022-04-22 DIAGNOSIS — S065XAA Traumatic subdural hemorrhage with loss of consciousness status unknown, initial encounter: Secondary | ICD-10-CM

## 2022-04-22 DIAGNOSIS — J449 Chronic obstructive pulmonary disease, unspecified: Secondary | ICD-10-CM

## 2022-04-22 DIAGNOSIS — D649 Anemia, unspecified: Secondary | ICD-10-CM | POA: Diagnosis not present

## 2022-04-22 DIAGNOSIS — F419 Anxiety disorder, unspecified: Secondary | ICD-10-CM | POA: Diagnosis not present

## 2022-04-22 DIAGNOSIS — Z8673 Personal history of transient ischemic attack (TIA), and cerebral infarction without residual deficits: Secondary | ICD-10-CM

## 2022-04-22 DIAGNOSIS — F0390 Unspecified dementia without behavioral disturbance: Secondary | ICD-10-CM

## 2022-04-22 DIAGNOSIS — J9611 Chronic respiratory failure with hypoxia: Secondary | ICD-10-CM

## 2022-04-22 DIAGNOSIS — E119 Type 2 diabetes mellitus without complications: Secondary | ICD-10-CM

## 2022-04-22 LAB — GLUCOSE, CAPILLARY
Glucose-Capillary: 124 mg/dL — ABNORMAL HIGH (ref 70–99)
Glucose-Capillary: 122 mg/dL — ABNORMAL HIGH (ref 70–99)
Glucose-Capillary: 189 mg/dL — ABNORMAL HIGH (ref 70–99)
Glucose-Capillary: 100 mg/dL — ABNORMAL HIGH (ref 70–99)

## 2022-04-22 LAB — CBC
HCT: 30 % — ABNORMAL LOW (ref 36.0–46.0)
Hemoglobin: 9.6 g/dL — ABNORMAL LOW (ref 12.0–15.0)
MCH: 29.8 pg (ref 26.0–34.0)
MCHC: 32 g/dL (ref 30.0–36.0)
MCV: 93.2 fL (ref 80.0–100.0)
Platelets: 356 10*3/uL (ref 150–400)
RBC: 3.22 MIL/uL — ABNORMAL LOW (ref 3.87–5.11)
RDW: 13.2 % (ref 11.5–15.5)
WBC: 8.3 10*3/uL (ref 4.0–10.5)
nRBC: 0 % (ref 0.0–0.2)

## 2022-04-22 MED ORDER — INSULIN ASPART 100 UNIT/ML IJ SOLN: 0.0000 [IU] | Freq: Three times a day (TID) | INTRAMUSCULAR | Status: DC

## 2022-04-22 NOTE — Plan of Care (Signed)

## 2022-04-22 NOTE — Progress Notes (Signed)
Occupational Therapy Treatment Patient Details Name: CEYLIN DREIBELBIS MRN: [id number] DOB: 11-01-1946 Today's Date: 04/22/2022   History of present illness Pt is a 76 y/o female admitted following fall. CT showed Small acute subdural hematomas overlying both cerebral  convexities and the falx. PMH includes CVA, COPD, s/p VP shunt, dementia, TD, and DM.   OT comments  Marlou Sa is making good progress, she was alert and oriented this session and following all 1-2 step commands appropriately. Pt did benefit from cues for safety and impulsivity. Otherwise, she was min G with RW for all transfers, ambulation, toileting and grooming. Pt endorses headache pain with position changes. She continues to benefit from OT. D/c remains appropriate - however should pt continue to progress and if she had 24/7 supervision, she could d/c home.    Recommendations for follow up therapy are one component of a multi-disciplinary discharge planning process, led by the attending physician.  Recommendations may be updated based on patient status, additional functional criteria and insurance authorization.    Follow Up Recommendations  Skilled nursing-short term rehab (<3 hours/day) (If pt ahs 24/7 supervision A - recommend home with [place])    Assistance Recommended at Discharge Frequent or constant Supervision/Assistance  Patient can return home with the following  A little help with walking and/or transfers;A little help with bathing/dressing/bathroom;Assistance with cooking/housework;Direct supervision/assist for medications management;Direct supervision/assist for financial management;Assist for transportation;Help with stairs or ramp for entrance   Equipment Recommendations  Tub/shower seat    Recommendations for Other Services      Precautions / Restrictions Precautions Precautions: Fall Restrictions Weight Bearing Restrictions: No       Mobility Bed Mobility Overal bed mobility: Needs Assistance Bed Mobility:  Supine to Sit, Sit to Supine     Supine to sit: Supervision Sit to supine: Supervision   General bed mobility comments: pt able to come to EOB without physical assist    Transfers Overall transfer level: Needs assistance Equipment used: Rolling walker (2 wheels) Transfers: Sit to/from Stand Sit to Stand: Min guard           General transfer comment: vc's for hand placement     Balance Overall balance assessment: Needs assistance Sitting-balance support: No upper extremity supported, Feet supported Sitting balance-Leahy Scale: Fair     Standing balance support: During functional activity, No upper extremity supported Standing balance-Leahy Scale: Fair Standing balance comment: statically standing at the sink during bimanual grooming                           ADL either performed or assessed with clinical judgement   ADL Overall ADL's : Needs assistance/impaired     Grooming: Set up;Supervision/safety Grooming Details (indicate cue type and reason): standing at the sink                 Toilet Transfer: Min guard;Ambulation;Rolling walker (2 wheels);Regular Glass blower/designer Details (indicate cue type and reason): for safety only         Functional mobility during ADLs: Min guard;Rolling walker (2 wheels) General ADL Comments: impulsive but no LOB noted, aware of surroundings in R environmnet    Extremity/Trunk Assessment Upper Extremity Assessment Upper Extremity Assessment: Generalized weakness   Lower Extremity Assessment Lower Extremity Assessment: Defer to PT evaluation        Vision   Vision Assessment?: No apparent visual deficits   Perception Perception Perception: Not tested   Praxis Praxis Praxis: Not tested  Cognition Arousal/Alertness: Awake/alert Behavior During Therapy: WFL for tasks assessed/performed Overall Cognitive Status: Impaired/Different from baseline Area of Impairment: Memory, Safety/judgement,  Following commands, Awareness, Problem solving, Orientation, Attention                 Orientation Level: Disoriented to, Place, Time Current Attention Level: Sustained Memory: Decreased short-term memory, Decreased recall of precautions Following Commands: Follows one step commands consistently Safety/Judgement: Decreased awareness of deficits, Decreased awareness of safety Awareness: Intellectual Problem Solving: Slow processing, Difficulty sequencing General Comments: cues throughout for safety and impulsvity        Exercises      Shoulder Instructions       General Comments VSS on 2L O2    Pertinent Vitals/ Pain       Pain Assessment Pain Assessment: Faces Faces Pain Scale: Hurts little more Pain Location: head Pain Descriptors / Indicators: Headache Pain Intervention(s): Limited activity within patient's tolerance, Monitored during session  Home Living                                          Prior Functioning/Environment              Frequency           Progress Toward Goals  OT Goals(current goals can now be found in the care plan section)  Progress towards OT goals: Progressing toward goals  Acute Rehab OT Goals Patient Stated Goal: to go home OT Goal Formulation: With patient Time For Goal Achievement: 05/01/22 Potential to Achieve Goals: Good ADL Goals Pt Will Perform Lower Body Bathing: with modified independence;sit to/from stand Pt Will Perform Lower Body Dressing: with modified independence;sit to/from stand Pt Will Transfer to Toilet: ambulating;with modified independence Pt Will Perform Toileting - Clothing Manipulation and hygiene: with modified independence;sit to/from stand Additional ADL Goal #1: Pt/daughter will verbalize 3 strategies to reduce risk of falls  Plan Discharge plan remains appropriate    Co-evaluation                 AM-PAC OT "6 Clicks" Daily Activity     Outcome Measure   Help  from another person eating meals?: None Help from another person taking care of personal grooming?: A Little Help from another person toileting, which includes using toliet, bedpan, or urinal?: A Little Help from another person bathing (including washing, rinsing, drying)?: A Little Help from another person to put on and taking off regular upper body clothing?: A Little Help from another person to put on and taking off regular lower body clothing?: A Little 6 Click Score: 19    End of Session Equipment Utilized During Treatment: Rolling walker (2 wheels);Oxygen  OT Visit Diagnosis: Unsteadiness on feet (R26.81);Other abnormalities of gait and mobility (R26.89);Muscle weakness (generalized) (M62.81);Other symptoms and signs involving cognitive function   Activity Tolerance Patient tolerated treatment well   Patient Left in bed;with call bell/phone within reach;with bed alarm set   Nurse Communication Mobility status        Time: 1683-7290 OT Time Calculation (min): 20 min  Charges: OT General Charges $OT Visit: 1 Visit OT Treatments $Self Care/Home Management : 8-22 mins   Jourdan Durbin A Mckenzee Beem 04/22/2022, 3:42 PM

## 2022-04-22 NOTE — Progress Notes (Addendum)
Daughter has requested a different neurosurgeon. At this point the patient does not need neurosurgery at all. Therefore we have called the hospitalists to take over her care until she is discharged\ to a SNF. All labs that the daughter demanded to have done are within normal limits. As suspected, the patient is delirious at times and this explains her confusion. Fine to be discharged from our standpoint at any time. We will sign off at this time.

## 2022-04-22 NOTE — Progress Notes (Addendum)
I spoke with the patients daughter Hara Milholland at length this morning. She was very pleasant and just had some concerns and trying to advocate for her mother. We did a UA yesterday And she wanted a CBC done to see if WBC was still elevated. I put that order in per K.Meyran NP. She is requesting to be switched to a different Neurosurgeon. I am finding out how to do that currently. The daughter has been very pleased with the nursing care that her mother is receiving. I also explained to her that the small SDH that her mother has could very well be attributing to the added confusion she has been having. The patient seems more coherent today and pleasant.  At this point neurosurgery will consult hospitalist.

## 2022-04-22 NOTE — Consult Note (Addendum)
Initial Consultation Note   Patient: Amanda Porter DOB: 1946/04/07 PCP: Pleas Koch, NP DOA: 04/16/2022 DOS: the patient was seen and examined on 04/22/2022 Primary service: Eustace Moore, MD  Referring physician: Sherley Bounds, MD Reason for consult: medical management and assume care   Assessment and Plan:  Bilateral subdural secondary to fall Stable.  Patient presents after reportedly falling hitting her head into a dresser while ambulating without her walker.  CT scan 5/17 showed small bilateral subdural with no midline shift or mass effect.  Repeat CT scan of the brain 5/18 was noted to be stable.  No further work-up was recommended at that time.  Social work was able to obtain placement at [facility] with plans for patient to be discharged on 5/24. -Neurosurgery had signed off  Dementia postconcussive syndrome It appears since patient's admission into the hospital she is only been alert and oriented to herself and seems to know that she came in because she fell.  However, the patient has not been oriented to place or time.  Daughter feels that this is an acute change from baseline.  Suspect symptoms could be multifactorial in the setting of patient having dementia, likely suffering a concussion, and/or hospital acquired delirium. -Delirium precautions  Abnormal urinalysis Patient did not report any urinary symptoms, but has dementia.  Urinalysis noted trace leukocytes with rare bacteria, and no significant WBCs to suggest infection.  Daughter requesting urine culture be obtained.  Urine culture was thought to be of low yield. -Urine culture obtained at daughter request.  NPH s/p VP shunt -Follow-up with neurosurgery at [facility] in the outpatient setting  Diabetes mellitus type 2 uncontrolled Blood sugars have been relatively well controlled.  Hemoglobin A1c noted to be 7.6 on 5/5.  Home medication regimen includes glipizide 2.5 mg daily with breakfast. -Hypoglycemic  protocols -Continue glipizide -CBGs before every meal changed to a sensitive SSI  Normocytic anemia Hemoglobin 10.6->9.6.  Patient baseline hemoglobin appears to range from 9-10 with no reports of bleeding. -Continue to monitor  COPD chronic respiratory failure with hypoxia At baseline patient is reported to be on 3 L of oxygen 24/7.  Home medication regimen includes Stiolto 2 puffs daily and albuterol inhaler/albuterol nebs as needed for shortness of breath/wheezing. -Continue current medication regiment with pharmacy substitution  Anxiety and depression Home medication regimen includes Wellbutrin 150 mg daily, clonazepam 1 mg nightly, BuSpar 45 mg p.o. every morning, and BuSpar 30 mg nightly. -Continue current medication regimen  History of CVA Suspect this is the likely reason patient has been on Plavix.  TRH will continue to follow the patient.  HPI: Amanda Porter is a 76 y.o. female with past medical history of COPD, oxygen dependent on 3 L of oxygen, CVA, NPH s/p VP shunt, dementia, diabetes mellitus type 2, tardive dyskinesia, anxiety, and depression presents after having a fall hitting her head on a dresser on 5/17.  History is limited from patient as she is confused stating that she is in [place], [place] and the year is 56.   Unclear time of loss of consciousness.  At baseline patient ambulates with use of a walker, but was not reported to be using it at the time of the fall.  Patient is followed by neurosurgery at [facility].  It was initially reported that she had been started on Plavix 2 days prior to the fall as there were plans possibly doing a lumbar puncture as have VP shunt was not  working properly.  Review of records make it seem as though Plavix had likely been held in preparation for this procedure. The patient is aware that she fell and hit her head recently and currently complains of left shoulder discomfort.  Initial CT scan of the brain  showed a small acute subdural hematoma overlying both cerebral convexities and pelvics and small left frontal scalp laceration and hematoma.  Labs were significant for WBC 12.5, hemoglobin 10.6, and glucose 194. Neurosurgery was consulted and admitted the patient for observation overnight with neurochecks.  Repeat CT scan the following morning was noted to be stable.  The initial plan was to discharge patient daughter requested skilled nursing facility placement.  PT/OT/speech evaluated the patient.  Transitions with care began searching for possible placement options.  Patient's daughter reported at baseline patient had been able to to perform basic self-care.  Daughter reported managing medications, finances, and the patient's meals.  Since the fall she seemed to be more confused and daughter question the possibility of urinary tract infection.  Urinalysis was obtained yesterday that noted trace leukocytes with rare bacteria, but was not concerning for infection.  Patient's daughter requested repeat CBC which was obtained today which showed WBC 8.3 and hemoglobin 9.6.  The patient's daughter had made request for second opinion with a different neurosurgeon in her mother's care.  The hospitalist service had been consulted at that time for medical management.  Patient daughter thinks that she may have a urinary tract infection despite urinalysis not being convincing at this time.    Review of Systems: Review of systems is completed and negative except for as noted above in HPI.  Although limited due to patient's history of dementia. Past Medical History:  Diagnosis Date   Acute respiratory failure requiring reintubation ([place]) 11/05/2021   Anxiety    Anxiety and depression    COPD (chronic obstructive pulmonary disease) (HCC)    CVA (cerebral vascular accident) ([place])    CVA (cerebral vascular accident) ([place])    L sided deficits   Dementia ([place])    Depression    Diabetes mellitus ([place])    Type 2    Diabetes mellitus without complication ([place])    Dysphagia    Dysphasia    History of lung cancer    Hyperlipidemia    Memory disturbance 11/20/2017   Normal pressure hydrocephalus ([place]) 2018   Retinal detachment    Right   Right carotid bruit 11/20/2017   Tardive dyskinesia 02/01/2020   Tardive dyskinesia    Thrombosis    Arterial to lower extremity?   Past Surgical History:  Procedure Laterality Date   LUNG REMOVAL, PARTIAL     left upper lobe   VENTRICULOPERITONEAL SHUNT  12/09/2016   VENTRICULOPERITONEAL SHUNT     Social History:  reports that she quit smoking about 4 years ago. Her smoking use included cigarettes. She has a 25.00 pack-year smoking history. She has never used smokeless tobacco. She reports that she does not currently use alcohol. She reports that she does not currently use drugs.  Allergies  Allergen Reactions   Lexapro [Escitalopram Oxalate] Other (See Comments)    Dizziness, nausea, fatigue   Neosporin  [Neomycin-Polymyxin-Gramicidin] Rash    (Neosporin)   Neosporin [Bacitracin-Polymyxin B] Rash    Family History  Problem Relation Age of Onset   Pneumonia Mother    Cancer Father    Diabetes Sister    Diabetes Sister    Breast cancer Neg Hx     Prior  to Admission medications   Medication Sig Start Date End Date Taking? Authorizing Provider  acetaminophen (TYLENOL) 500 MG tablet Take 1,000 mg by mouth every 6 (six) hours as needed for moderate pain.   Yes [provider]  albuterol (PROVENTIL) (2.5 MG/3ML) 0.083% nebulizer solution USE 1 VIAL IN NEBULIZER EVERY 6 HOURS Patient taking differently: Take 2.5 mg by nebulization every 6 (six) hours as needed for wheezing or shortness of breath. 07/20/21  Yes Young, Clinton D, MD  albuterol (VENTOLIN HFA) 108 (90 Base) MCG/ACT inhaler INHALE 2 PUFFS BY MOUTH EVERY 4 HOURS AS NEEDED Patient taking differently: Inhale 2 puffs into the lungs every 4 (four) hours as needed for wheezing or shortness of  breath. 06/12/21  Yes Young, Tarri Fuller D, MD  ASHWAGANDHA PO Take 1 capsule by mouth daily as needed (anxiety).   Yes [provider]  buPROPion (WELLBUTRIN XL) 150 MG 24 hr tablet 1 qam for 5 days then 2  qam Patient taking differently: Take 300 mg by mouth daily with breakfast. 03/12/22  Yes Plovsky, Berneta Sages, MD  busPIRone (BUSPAR) 15 MG tablet 3  qam   2 qpm Patient taking differently: Take 30 mg by mouth daily with breakfast. 03/12/22  Yes Plovsky, Berneta Sages, MD  clonazePAM (KLONOPIN) 1 MG tablet 1 qhs Patient taking differently: Take 1 mg by mouth at bedtime. 03/12/22  Yes Plovsky, Berneta Sages, MD  Cyanocobalamin (B-12 PO) Take 1 capsule by mouth daily.   Yes [provider]  folic acid (FOLVITE) 1 MG tablet Take 1 tablet (1 mg total) by mouth daily. 12/13/21  Yes Pleas Koch, NP  glipiZIDE (GLUCOTROL XL) 2.5 MG 24 hr tablet TAKE 1 TABLET (2.5 MG TOTAL) BY MOUTH DAILY WITH BREAKFAST. FOR DIABETES. 04/10/22  Yes Pleas Koch, NP  guaiFENesin (MUCINEX) 600 MG 12 hr tablet Take 1 tablet (600 mg total) by mouth 2 (two) times daily. 11/11/21  Yes Kc, Maren Beach, MD  rosuvastatin (CRESTOR) 20 MG tablet TAKE 1 TABLET (20 MG TOTAL) BY MOUTH EVERY EVENING. FOR CHOLESTEROL. 04/09/21  Yes Pleas Koch, NP  tamsulosin (FLOMAX) 0.4 MG CAPS capsule Take 1 capsule (0.4 mg total) by mouth daily. For urine retention 12/13/21  Yes Pleas Koch, NP  Tiotropium Bromide-Olodaterol (STIOLTO RESPIMAT) 2.5-2.5 MCG/ACT AERS INHALE 2 PUFFS BY MOUTH INTO THE LUNGS DAILY Patient taking differently: Inhale 2 puffs into the lungs daily. 12/26/21  Yes Baird Lyons D, MD  Accu-Chek Softclix Lancets lancets Use as instructed 04/04/22   Eugenia Pancoast, FNP  alendronate (FOSAMAX) 70 MG tablet Take with a full glass of water on an empty stomach for bone density. Patient not taking: Reported on 04/16/2022 03/14/21   Pleas Koch, NP  CALCIUM PO Take 1 capsule by mouth daily. Patient not taking: Reported  on 04/16/2022    [provider]  clopidogrel (PLAVIX) 75 MG tablet TAKE 1 TABLET BY MOUTH EVERY DAY Patient not taking: Reported on 04/16/2022 04/09/21   Pleas Koch, NP  Ferrous Sulfate (IRON PO) Take by mouth. Patient not taking: Reported on 04/16/2022    [provider]  glucose blood test strip Use as instructed 04/04/22   Eugenia Pancoast, FNP  Multiple Vitamin (MULTIVITAMIN) capsule Take 1 capsule by mouth daily. Patient not taking: Reported on 04/16/2022    [provider]  OXYGEN 3 L continuous.     [provider]    Physical Exam: Vitals:   04/21/22 2774 04/22/22 1287 04/22/22 0724 04/22/22 0803  BP: Marland Kitchen)  132/56 (!) 99/54 (!) 97/55   Pulse: 91 98 91 92  Resp: 20 18 18 18   Temp: 98.7 F (37.1 C) 98.5 F (36.9 C) (!) 97.5 F (36.4 C)   TempSrc: Oral Oral    SpO2: 100% 99% 96% 96%  Weight:      Height:       Exam  Constitutional: Elderly female currently in no acute distress  Eyes: PERRL, lids and conjunctivae normal ENMT: Mucous membranes are moist. Posterior pharynx clear of any exudate or lesions.  Neck: normal, supple, no JVD appreciated Respiratory: Normal respiratory effort with O2 saturation currently maintained on 2 L of nasal cannula oxygen. Cardiovascular: Regular rate and rhythm, no murmurs / rubs / gallops. No extremity edema.   Abdomen: no tenderness, no masses palpated. Bowel sounds positive.  Musculoskeletal: no clubbing / cyanosis.  Mild decreased range of motion to the left shoulder due to pain Skin: no rashes  Neurologic: CN 2-12 grossly intact. Strength 5/5 in all 4.  Psychiatric: Poor memory.  Alert and oriented to self, but not place or time.  Data Reviewed:   Reviewed labs, imaging, and pertinent records as noted above in HPI.   Family Communication: Daughter updated over the phone Primary team communication: Updated Thank you very much for involving [place] in the care of your patient.  Author: Norval Morton, MD 04/22/2022 10:25 AM  For on call review [email or web address].

## 2022-04-22 NOTE — TOC Progression Note (Signed)
Transition of Care [facility]) - Progression Note    Patient Details  Name: Amanda Porter MRN: [id number] Date of Birth: [birth year]  Transition of Care [facility]) CM/SW [place], [place] Phone Number: 04/22/2022, 2:43 PM  Clinical Narrative:    Authorization has been approved for pt to go to [facility] once DC orders and summary are placed in chart.  [id number] 04/22/2022-04/24/2022  CSW waiting for DC plan from medical team, will continue to follow for DC needs.  Expected Discharge Plan: Skilled Nursing Facility Barriers to Discharge: SNF Pending bed offer  Expected Discharge Plan and Services Expected Discharge Plan: [place]: [place] arrangements for the past 2 months: Single Family Home                                       Social Determinants of Health (SDOH) Interventions    Readmission Risk Interventions     View : No data to display.

## 2022-04-23 ENCOUNTER — Ambulatory Visit (HOSPITAL_COMMUNITY): Payer: Medicare PPO | Admitting: Psychiatry

## 2022-04-23 DIAGNOSIS — Z7401 Bed confinement status: Secondary | ICD-10-CM | POA: Diagnosis not present

## 2022-04-23 DIAGNOSIS — F411 Generalized anxiety disorder: Secondary | ICD-10-CM | POA: Diagnosis not present

## 2022-04-23 DIAGNOSIS — R4189 Other symptoms and signs involving cognitive functions and awareness: Secondary | ICD-10-CM | POA: Diagnosis not present

## 2022-04-23 DIAGNOSIS — M6281 Muscle weakness (generalized): Secondary | ICD-10-CM | POA: Diagnosis not present

## 2022-04-23 DIAGNOSIS — R2689 Other abnormalities of gait and mobility: Secondary | ICD-10-CM | POA: Diagnosis not present

## 2022-04-23 DIAGNOSIS — I6201 Nontraumatic acute subdural hemorrhage: Secondary | ICD-10-CM | POA: Diagnosis not present

## 2022-04-23 DIAGNOSIS — E119 Type 2 diabetes mellitus without complications: Secondary | ICD-10-CM | POA: Diagnosis not present

## 2022-04-23 DIAGNOSIS — N390 Urinary tract infection, site not specified: Secondary | ICD-10-CM | POA: Diagnosis not present

## 2022-04-23 DIAGNOSIS — Z9181 History of falling: Secondary | ICD-10-CM | POA: Diagnosis not present

## 2022-04-23 DIAGNOSIS — I6930 Unspecified sequelae of cerebral infarction: Secondary | ICD-10-CM | POA: Diagnosis not present

## 2022-04-23 DIAGNOSIS — Z982 Presence of cerebrospinal fluid drainage device: Secondary | ICD-10-CM | POA: Diagnosis not present

## 2022-04-23 DIAGNOSIS — G912 (Idiopathic) normal pressure hydrocephalus: Secondary | ICD-10-CM | POA: Diagnosis not present

## 2022-04-23 DIAGNOSIS — R2681 Unsteadiness on feet: Secondary | ICD-10-CM | POA: Diagnosis not present

## 2022-04-23 DIAGNOSIS — R682 Dry mouth, unspecified: Secondary | ICD-10-CM | POA: Diagnosis not present

## 2022-04-23 DIAGNOSIS — Z79899 Other long term (current) drug therapy: Secondary | ICD-10-CM | POA: Diagnosis not present

## 2022-04-23 DIAGNOSIS — R262 Difficulty in walking, not elsewhere classified: Secondary | ICD-10-CM | POA: Diagnosis not present

## 2022-04-23 DIAGNOSIS — S065XAD Traumatic subdural hemorrhage with loss of consciousness status unknown, subsequent encounter: Secondary | ICD-10-CM | POA: Diagnosis not present

## 2022-04-23 DIAGNOSIS — F32A Depression, unspecified: Secondary | ICD-10-CM | POA: Diagnosis not present

## 2022-04-23 DIAGNOSIS — M249 Joint derangement, unspecified: Secondary | ICD-10-CM | POA: Diagnosis not present

## 2022-04-23 DIAGNOSIS — R1312 Dysphagia, oropharyngeal phase: Secondary | ICD-10-CM | POA: Diagnosis not present

## 2022-04-23 DIAGNOSIS — J449 Chronic obstructive pulmonary disease, unspecified: Secondary | ICD-10-CM | POA: Diagnosis not present

## 2022-04-23 DIAGNOSIS — E118 Type 2 diabetes mellitus with unspecified complications: Secondary | ICD-10-CM | POA: Diagnosis not present

## 2022-04-23 DIAGNOSIS — F03B18 Unspecified dementia, moderate, with other behavioral disturbance: Secondary | ICD-10-CM | POA: Diagnosis not present

## 2022-04-23 DIAGNOSIS — S065X0A Traumatic subdural hemorrhage without loss of consciousness, initial encounter: Secondary | ICD-10-CM | POA: Diagnosis not present

## 2022-04-23 DIAGNOSIS — R41841 Cognitive communication deficit: Secondary | ICD-10-CM | POA: Diagnosis not present

## 2022-04-23 DIAGNOSIS — D649 Anemia, unspecified: Secondary | ICD-10-CM | POA: Diagnosis not present

## 2022-04-23 DIAGNOSIS — R399 Unspecified symptoms and signs involving the genitourinary system: Secondary | ICD-10-CM | POA: Diagnosis not present

## 2022-04-23 DIAGNOSIS — S065XAA Traumatic subdural hemorrhage with loss of consciousness status unknown, initial encounter: Secondary | ICD-10-CM | POA: Diagnosis not present

## 2022-04-23 DIAGNOSIS — M60812 Other myositis, left shoulder: Secondary | ICD-10-CM | POA: Diagnosis not present

## 2022-04-23 DIAGNOSIS — I739 Peripheral vascular disease, unspecified: Secondary | ICD-10-CM | POA: Diagnosis not present

## 2022-04-23 DIAGNOSIS — I6350 Cerebral infarction due to unspecified occlusion or stenosis of unspecified cerebral artery: Secondary | ICD-10-CM | POA: Diagnosis not present

## 2022-04-23 DIAGNOSIS — I639 Cerebral infarction, unspecified: Secondary | ICD-10-CM | POA: Diagnosis not present

## 2022-04-23 DIAGNOSIS — F331 Major depressive disorder, recurrent, moderate: Secondary | ICD-10-CM | POA: Diagnosis not present

## 2022-04-23 DIAGNOSIS — F29 Unspecified psychosis not due to a substance or known physiological condition: Secondary | ICD-10-CM | POA: Diagnosis not present

## 2022-04-23 DIAGNOSIS — F0390 Unspecified dementia without behavioral disturbance: Secondary | ICD-10-CM | POA: Diagnosis not present

## 2022-04-23 LAB — URINE CULTURE: Culture: 20000 — AB

## 2022-04-23 LAB — GLUCOSE, CAPILLARY
Glucose-Capillary: 116 mg/dL — ABNORMAL HIGH (ref 70–99)
Glucose-Capillary: 163 mg/dL — ABNORMAL HIGH (ref 70–99)

## 2022-04-23 NOTE — Progress Notes (Addendum)
PROGRESS NOTE  Amanda Porter  ZOX:[id number] DOB: February 13, 1946 DOA: 04/16/2022 PCP: Pleas Koch, NP   Brief Narrative:  Amanda Porter is a 76 y.o. female with past medical history of COPD, oxygen dependent on 3 L of oxygen, CVA, NPH s/p VP shunt, dementia, diabetes mellitus type 2, tardive dyskinesia, anxiety, and depression presents after having a fall hitting her head on a dresser on 5/17. Initial CT scan of the brain showed a small acute subdural hematoma overlying both cerebral convexities and pelvics and small left frontal scalp laceration and hematoma.  Labs were significant for WBC 12.5, hemoglobin 10.6, and glucose 194. Neurosurgery was consulted and admitted the patient . Repeat CT scans were stable.  PT/OT recommended SNF  on discharge.  TRH was consulted for medical management.  Patient is medically stable for discharge to skilled nursing facility whenever possible.   Assessment & Plan:  Principal Problem:   Subdural hematoma (HCC) Active Problems:   Dementia without behavioral disturbance (HCC)   Abnormal urinalysis   Normal pressure hydrocephalus (HCC)   S/P VP shunt   Type 2 diabetes mellitus without complication, without long-term current use of insulin (HCC)   COPD mixed type (HCC)   Anemia   Chronic respiratory failure with hypoxia (HCC)   Anxiety and depression   History of CVA (cerebrovascular accident)     Bilateral subdural secondary to fall Stable.  Patient presents after reportedly falling hitting her head into a dresser while ambulating without her walker.  CT scan 5/17 showed small bilateral subdural with no midline shift or mass effect.  Repeat CT scan of the brain 5/18 was noted to be stable.  No further work-up was recommended at that time.    Dementia postconcussive syndrome It appears since patient's admission into the hospital she is only been alert and oriented to herself and seems to know that she came in because she fell.  However, the patient has  not been oriented to place or time.  Daughter feels that this is an acute change from baseline.  Suspect symptoms could be multifactorial in the setting of patient having dementia, likely suffering a concussion, and/or hospital acquired delirium.Maintain Delirium precautions.  Low suspicion for any other etiology for change in mental status.   Abnormal urinalysis Patient did not report any urinary symptoms, but has dementia.  Urinalysis noted trace leukocytes with rare bacteria, and no significant WBCs to suggest infection.  Daughter requested urine culture be obtained but no indication with normal UA, no symptoms.  Very low suspicion for UTI.   NPH s/p VP shunt -Follow-up with neurosurgery at [facility] in the outpatient setting   Diabetes mellitus type 2  Blood sugars have been relatively well controlled.  Hemoglobin A1c noted to be 7.6 on 5/5.  Home medication regimen includes glipizide 2.5 mg daily with breakfast.  Continue current insulin regimen, continue home regimen on discharge   Normocytic anemia Hemoglobin 10.6->9.6.  Patient baseline hemoglobin appears to range from 9-10 with no reports of bleeding.   COPD chronic respiratory failure with hypoxia At baseline patient is reported to be on 3 L of oxygen 24/7.  Home medication regimen includes Stiolto 2 puffs daily and albuterol inhaler/albuterol nebs as needed for shortness of breath/wheezing. -Continue current medication regiment with pharmacy substitution   Anxiety and depression Home medication regimen includes Wellbutrin 150 mg daily, clonazepam 1 mg nightly, BuSpar 45 mg p.o. every morning, and BuSpar 30 mg nightly. -Continue current medication regimen   History of CVA  Suspect this is the likely reason patient has been on Plavix.  Plavix discontinued  Dizziness Chronic problem.  We checked orthostatic today and they are normal.  Patient has been ambulating without any problem  Debility/deconditioning PT/OT recommended skilled  nursing facility.  Patient is stable for discharge from medical perspective.  We will continue to follow if she is not discharged.  I called the daughter ,phone not received/will try again.  Discharge summary and orders will be put by neurosurgery        DVT prophylaxis:SCDs Start: 04/16/22 1511     Code Status: Full Code  Family Communication: Called daughter [name] on phone, call not received  Patient status: Inpatient  Patient is from : Home  Anticipated discharge to: Skilled nursing facility  Estimated DC date: Whenever possible    Antimicrobials:  Anti-infectives (From admission, onward)    None       Subjective:  Patient seen and examined at the bedside this morning.  She was in the bathroom, standing on her feet, looks very comfortable, alert and awake.  Knows that she is in the hospital.  Denies any complaints.  Objective: Vitals:   04/22/22 1942 04/22/22 2331 04/23/22 0343 04/23/22 0736  BP: (!) 115/58 (!) 130/56 (!) 136/59 (!) 118/51  Pulse: 91 87 84 89  Resp: 18 16 19 14   Temp: 98.8 F (37.1 C) 98.3 F (36.8 C) 98.1 F (36.7 C) 98.2 F (36.8 C)  TempSrc: Oral Oral Oral Oral  SpO2: 100% 99% 100% 99%  Weight:      Height:        Intake/Output Summary (Last 24 hours) at 04/23/2022 0955 Last data filed at 04/22/2022 1700 Gross per 24 hour  Intake 240 ml  Output 1 ml  Net 239 ml   Filed Weights   04/16/22 1018  Weight: 50.8 kg    Examination:  General exam: Overall comfortable, not in distress HEENT: PERRL Respiratory system:  no wheezes or crackles  Cardiovascular system: S1 & S2 heard, RRR.  Gastrointestinal system: Abdomen is nondistended, soft and nontender. Central nervous system: Alert and awake, oriented to place, obeys commands, no focal neurological deficits Extremities: No edema, no clubbing ,no cyanosis Skin: No rashes, no ulcers,no icterus     Data Reviewed: I have personally reviewed following labs and imaging  studies  CBC: Recent Labs  Lab 04/16/22 1026 04/22/22 1000  WBC 12.5* 8.3  NEUTROABS 9.7*  --   HGB 10.6* 9.6*  HCT 34.3* 30.0*  MCV 95.3 93.2  PLT 287 001   Basic Metabolic Panel: Recent Labs  Lab 04/16/22 1026 04/21/22 1244  NA 141 141  K 3.7 3.7  CL 101 103  CO2 30 33*  GLUCOSE 194* 130*  BUN 16 10  CREATININE 0.82 0.56  CALCIUM 9.0 9.5     No results found for this or any previous visit (from the past 240 hour(s)).   Radiology Studies: No results found.  Scheduled Meds:  arformoterol  15 mcg Nebulization BID   And   umeclidinium bromide  1 puff Inhalation Daily   buPROPion  150 mg Oral Daily   busPIRone  30 mg Oral QHS   busPIRone  45 mg Oral Q breakfast   clonazePAM  1 mg Oral QHS   folic acid  1 mg Oral Daily   glipiZIDE  2.5 mg Oral Q breakfast   insulin aspart  0-9 Units Subcutaneous TID WC   tamsulosin  0.4 mg Oral Daily   Continuous Infusions:  0.9 % sodium chloride with kcl 50 mL/hr at 04/17/22 1500     LOS: 5 days   Shelly Coss, MD [facility] P5/24/2023, 9:55 AM

## 2022-04-23 NOTE — Discharge Summary (Signed)
Physician Discharge Summary  Patient ID: Amanda Porter MRN: [id number] DOB/AGE: [birth year] 77 y.o.  Admit date: 04/16/2022 Discharge date: 04/23/2022  Admission Diagnoses: sdh   Discharge Diagnoses: same   Discharged Condition: good  Hospital Course: The patient was admitted on 04/16/2022.The hospital course was routine. There were no complications. The patient remained afebrile with stable vital signs, and tolerated a regular diet. The patient continued to increase activities, and pain was well controlled with oral pain medications.   Consults: None  Significant Diagnostic Studies:  Results for orders placed or performed during the hospital encounter of 04/16/22  CBC with Differential  Result Value Ref Range   WBC 12.5 (H) 4.0 - 10.5 K/uL   RBC 3.60 (L) 3.87 - 5.11 MIL/uL   Hemoglobin 10.6 (L) 12.0 - 15.0 g/dL   HCT 34.3 (L) 36.0 - 46.0 %   MCV 95.3 80.0 - 100.0 fL   MCH 29.4 26.0 - 34.0 pg   MCHC 30.9 30.0 - 36.0 g/dL   RDW 13.5 11.5 - 15.5 %   Platelets 287 150 - 400 K/uL   nRBC 0.0 0.0 - 0.2 %   Neutrophils Relative % 78 %   Neutro Abs 9.7 (H) 1.7 - 7.7 K/uL   Lymphocytes Relative 15 %   Lymphs Abs 1.8 0.7 - 4.0 K/uL   Monocytes Relative 6 %   Monocytes Absolute 0.8 0.1 - 1.0 K/uL   Eosinophils Relative 1 %   Eosinophils Absolute 0.1 0.0 - 0.5 K/uL   Basophils Relative 0 %   Basophils Absolute 0.1 0.0 - 0.1 K/uL   Immature Granulocytes 0 %   Abs Immature Granulocytes 0.03 0.00 - 0.07 K/uL  Comprehensive metabolic panel  Result Value Ref Range   Sodium 141 135 - 145 mmol/L   Potassium 3.7 3.5 - 5.1 mmol/L   Chloride 101 98 - 111 mmol/L   CO2 30 22 - 32 mmol/L   Glucose, Bld 194 (H) 70 - 99 mg/dL   BUN 16 8 - 23 mg/dL   Creatinine, Ser 0.82 0.44 - 1.00 mg/dL   Calcium 9.0 8.9 - 10.3 mg/dL   Total Protein 6.0 (L) 6.5 - 8.1 g/dL   Albumin 3.3 (L) 3.5 - 5.0 g/dL   AST 15 15 - 41 U/L   ALT 11 0 - 44 U/L   Alkaline Phosphatase 58 38 - 126 U/L   Total Bilirubin 0.4  0.3 - 1.2 mg/dL   GFR, Estimated >60 >60 mL/min   Anion gap 10 5 - 15  Glucose, capillary  Result Value Ref Range   Glucose-Capillary 107 (H) 70 - 99 mg/dL  Glucose, capillary  Result Value Ref Range   Glucose-Capillary 126 (H) 70 - 99 mg/dL   Comment 1 Notify RN    Comment 2 Document in Chart   Glucose, capillary  Result Value Ref Range   Glucose-Capillary 86 70 - 99 mg/dL  Glucose, capillary  Result Value Ref Range   Glucose-Capillary 158 (H) 70 - 99 mg/dL  Glucose, capillary  Result Value Ref Range   Glucose-Capillary 84 70 - 99 mg/dL  Glucose, capillary  Result Value Ref Range   Glucose-Capillary 140 (H) 70 - 99 mg/dL  Glucose, capillary  Result Value Ref Range   Glucose-Capillary 94 70 - 99 mg/dL  Glucose, capillary  Result Value Ref Range   Glucose-Capillary 120 (H) 70 - 99 mg/dL  Glucose, capillary  Result Value Ref Range   Glucose-Capillary 92 70 - 99 mg/dL  Glucose, capillary  Result Value Ref Range   Glucose-Capillary 86 70 - 99 mg/dL  Glucose, capillary  Result Value Ref Range   Glucose-Capillary 111 (H) 70 - 99 mg/dL   Comment 1 Notify RN    Comment 2 Document in Chart   Glucose, capillary  Result Value Ref Range   Glucose-Capillary 111 (H) 70 - 99 mg/dL   Comment 1 Notify RN    Comment 2 Document in Chart   Glucose, capillary  Result Value Ref Range   Glucose-Capillary 116 (H) 70 - 99 mg/dL   Comment 1 Notify RN    Comment 2 Document in Chart   Glucose, capillary  Result Value Ref Range   Glucose-Capillary 134 (H) 70 - 99 mg/dL  Glucose, capillary  Result Value Ref Range   Glucose-Capillary 111 (H) 70 - 99 mg/dL   Comment 1 Notify RN    Comment 2 Document in Chart   Glucose, capillary  Result Value Ref Range   Glucose-Capillary 166 (H) 70 - 99 mg/dL   Comment 1 Notify RN    Comment 2 Document in Chart   Glucose, capillary  Result Value Ref Range   Glucose-Capillary 104 (H) 70 - 99 mg/dL   Comment 1 Notify RN    Comment 2 Document in  Chart   Glucose, capillary  Result Value Ref Range   Glucose-Capillary 129 (H) 70 - 99 mg/dL  Glucose, capillary  Result Value Ref Range   Glucose-Capillary 132 (H) 70 - 99 mg/dL  Urinalysis, Routine w reflex microscopic Urine, Clean Catch  Result Value Ref Range   Color, Urine YELLOW YELLOW   APPearance HAZY (A) CLEAR   Specific Gravity, Urine 1.012 1.005 - 1.030   pH 7.0 5.0 - 8.0   Glucose, UA NEGATIVE NEGATIVE mg/dL   Hgb urine dipstick NEGATIVE NEGATIVE   Bilirubin Urine NEGATIVE NEGATIVE   Ketones, ur NEGATIVE NEGATIVE mg/dL   Protein, ur NEGATIVE NEGATIVE mg/dL   Nitrite NEGATIVE NEGATIVE   Leukocytes,Ua TRACE (A) NEGATIVE   RBC / HPF 0-5 0 - 5 RBC/hpf   WBC, UA 0-5 0 - 5 WBC/hpf   Bacteria, UA RARE (A) NONE SEEN   Squamous Epithelial / LPF 0-5 0 - 5   Mucus PRESENT   Basic metabolic panel  Result Value Ref Range   Sodium 141 135 - 145 mmol/L   Potassium 3.7 3.5 - 5.1 mmol/L   Chloride 103 98 - 111 mmol/L   CO2 33 (H) 22 - 32 mmol/L   Glucose, Bld 130 (H) 70 - 99 mg/dL   BUN 10 8 - 23 mg/dL   Creatinine, Ser 0.56 0.44 - 1.00 mg/dL   Calcium 9.5 8.9 - 10.3 mg/dL   GFR, Estimated >60 >60 mL/min   Anion gap 5 5 - 15  Glucose, capillary  Result Value Ref Range   Glucose-Capillary 114 (H) 70 - 99 mg/dL  Glucose, capillary  Result Value Ref Range   Glucose-Capillary 156 (H) 70 - 99 mg/dL  Glucose, capillary  Result Value Ref Range   Glucose-Capillary 126 (H) 70 - 99 mg/dL  CBC  Result Value Ref Range   WBC 8.3 4.0 - 10.5 K/uL   RBC 3.22 (L) 3.87 - 5.11 MIL/uL   Hemoglobin 9.6 (L) 12.0 - 15.0 g/dL   HCT 30.0 (L) 36.0 - 46.0 %   MCV 93.2 80.0 - 100.0 fL   MCH 29.8 26.0 - 34.0 pg   MCHC 32.0 30.0 - 36.0 g/dL   RDW 13.2 11.5 -  15.5 %   Platelets 356 150 - 400 K/uL   nRBC 0.0 0.0 - 0.2 %  Glucose, capillary  Result Value Ref Range   Glucose-Capillary 124 (H) 70 - 99 mg/dL  Glucose, capillary  Result Value Ref Range   Glucose-Capillary 122 (H) 70 - 99 mg/dL   Glucose, capillary  Result Value Ref Range   Glucose-Capillary 189 (H) 70 - 99 mg/dL   Comment 1 Notify RN    Comment 2 Document in Chart   Glucose, capillary  Result Value Ref Range   Glucose-Capillary 100 (H) 70 - 99 mg/dL  Glucose, capillary  Result Value Ref Range   Glucose-Capillary 116 (H) 70 - 99 mg/dL    DG Chest 2 View  Result Date: 04/04/2022 CLINICAL DATA:  Wheezing. EXAM: CHEST - 2 VIEW COMPARISON:  Chest x-ray 12/13/2021. FINDINGS: There are minimal patchy and strandy opacities in the lung bases. There is a small left pleural effusion. Cardiomediastinal silhouette is within normal limits. Surgical clips in soft tissue prominence in the left suprahilar region appears unchanged. No pneumothorax. No acute fractures are seen. IMPRESSION: 1. Minimal bibasilar atelectasis/airspace disease with small left pleural effusion. Electronically Signed   By: Ronney Asters M.D.   On: 04/04/2022 23:26   CT HEAD WO CONTRAST (5MM)  Result Date: 04/17/2022 CLINICAL DATA:  76 year old female status post fall with small subdural hematomas. EXAM: CT HEAD WITHOUT CONTRAST TECHNIQUE: Contiguous axial images were obtained from the base of the skull through the vertex without intravenous contrast. RADIATION DOSE REDUCTION: This exam was performed according to the departmental dose-optimization program which includes automated exposure control, adjustment of the mA and/or kV according to patient size and/or use of iterative reconstruction technique. COMPARISON:  Head CT 04/16/2022. FINDINGS: Brain: Hyperdense para falcine subdural blood remains small volume and not significantly changed from yesterday. Best seen on coronal images is additional small volume bilateral hemispheric subdural blood, up to 3 mm in thickness bilaterally and stable, mildly read distributed since yesterday. Small volume of blood layering on the bilateral tentorium. Small volume of hypodense right side subdural suspected anteriorly.  Underlying right superior frontal approach CSF shunt. No intraventricular or subarachnoid hemorrhage. No hemorrhagic contusion. Stable ventricle size and configuration. Stable gray-white matter differentiation throughout the brain. No cortically based acute infarct identified. Chronic perivascular space or less likely lacunar infarct of the right cerebral peduncle. Vascular: Calcified atherosclerosis at the skull base. No suspicious intracranial vascular hyperdensity. Skull: Chronic right superior frontal burr hole for shunt. No acute osseous abnormality identified. Sinuses/Orbits: Visualized paranasal sinuses and mastoids are stable and well aerated. Other: Broad-based left anterior and lateral scalp, forehead and periorbital superficial soft tissue hematoma has mildly progressed. No scalp soft tissue gas. Contralateral right scalp CSF shunt reservoir and tubing with no adverse features. Intraorbital soft tissues appear to remain normal. IMPRESSION: 1. Stable small volume bilateral hemispheric and parafalcine Subdural Hematomas since yesterday. No significant intracranial mass effect. 2. Broad-based left scalp and periorbital soft tissue hematoma without underlying skull fracture. 3. No new intracranial abnormality. Pre-existing right side CSF shunt. Electronically Signed   By: Genevie Ann M.D.   On: 04/17/2022 05:48   CT Head Wo Contrast  Result Date: 04/16/2022 CLINICAL DATA:  Fall. EXAM: CT HEAD WITHOUT CONTRAST CT CERVICAL SPINE WITHOUT CONTRAST TECHNIQUE: Multidetector CT imaging of the head and cervical spine was performed following the standard protocol without intravenous contrast. Multiplanar CT image reconstructions of the cervical spine were also generated. RADIATION DOSE REDUCTION: This exam was  performed according to the departmental dose-optimization program which includes automated exposure control, adjustment of the mA and/or kV according to patient size and/or use of iterative reconstruction  technique. COMPARISON:  CT head dated November 10, 2021. CT head and cervical spine dated November 04, 2021. FINDINGS: CT HEAD FINDINGS Brain: Small acute left cerebral convexity subdural hematoma measuring up to 5 mm over the temporal lobe (series 3, image 16) and 3 mm along the anterior and posterior falx. Tiny acute right cerebral convexity subdural hematoma measuring up to 3 mm in diameter over the frontal lobe (series 3, image 19). No significant midline shift. Unchanged right frontal approach shunt catheter. No evidence of acute infarction, hydrocephalus, or mass lesion. Stable atrophy and chronic microvascular ischemic changes. Old right thalamic lacunar infarct again noted. Vascular: Atherosclerotic vascular calcification of the carotid siphons. No hyperdense vessel. Skull: Right frontal burr hole for shunt catheter again noted. Negative for fracture or focal lesion. Sinuses/Orbits: No acute finding. Other: Small left frontal scalp laceration and hematoma. CT CERVICAL SPINE FINDINGS Alignment: No traumatic malalignment. Unchanged trace anterolisthesis at C4-C5. Skull base and vertebrae: No acute fracture. No primary bone lesion or focal pathologic process. Soft tissues and spinal canal: No prevertebral fluid or swelling. No visible canal hematoma. Disc levels: Unchanged moderate disc height loss and uncovertebral hypertrophy in the lower cervical spine. Upper chest: Severe centrilobular emphysema. Other: None. IMPRESSION: 1. Small acute subdural hematomas overlying both cerebral convexities and the falx. No significant midline shift. 2. Small left frontal scalp laceration and hematoma. 3. No acute cervical spine fracture or traumatic listhesis. 4. Emphysema (ICD10-J43.9). Critical Value/emergent results were called by telephone at the time of interpretation on 04/16/2022 at 11:17 am to provider DAN FLOYD , who verbally acknowledged these results. Electronically Signed   By: Titus Dubin M.D.   On:  04/16/2022 11:34   CT Cervical Spine Wo Contrast  Result Date: 04/16/2022 CLINICAL DATA:  Fall. EXAM: CT HEAD WITHOUT CONTRAST CT CERVICAL SPINE WITHOUT CONTRAST TECHNIQUE: Multidetector CT imaging of the head and cervical spine was performed following the standard protocol without intravenous contrast. Multiplanar CT image reconstructions of the cervical spine were also generated. RADIATION DOSE REDUCTION: This exam was performed according to the departmental dose-optimization program which includes automated exposure control, adjustment of the mA and/or kV according to patient size and/or use of iterative reconstruction technique. COMPARISON:  CT head dated November 10, 2021. CT head and cervical spine dated November 04, 2021. FINDINGS: CT HEAD FINDINGS Brain: Small acute left cerebral convexity subdural hematoma measuring up to 5 mm over the temporal lobe (series 3, image 16) and 3 mm along the anterior and posterior falx. Tiny acute right cerebral convexity subdural hematoma measuring up to 3 mm in diameter over the frontal lobe (series 3, image 19). No significant midline shift. Unchanged right frontal approach shunt catheter. No evidence of acute infarction, hydrocephalus, or mass lesion. Stable atrophy and chronic microvascular ischemic changes. Old right thalamic lacunar infarct again noted. Vascular: Atherosclerotic vascular calcification of the carotid siphons. No hyperdense vessel. Skull: Right frontal burr hole for shunt catheter again noted. Negative for fracture or focal lesion. Sinuses/Orbits: No acute finding. Other: Small left frontal scalp laceration and hematoma. CT CERVICAL SPINE FINDINGS Alignment: No traumatic malalignment. Unchanged trace anterolisthesis at C4-C5. Skull base and vertebrae: No acute fracture. No primary bone lesion or focal pathologic process. Soft tissues and spinal canal: No prevertebral fluid or swelling. No visible canal hematoma. Disc levels: Unchanged moderate disc  height  loss and uncovertebral hypertrophy in the lower cervical spine. Upper chest: Severe centrilobular emphysema. Other: None. IMPRESSION: 1. Small acute subdural hematomas overlying both cerebral convexities and the falx. No significant midline shift. 2. Small left frontal scalp laceration and hematoma. 3. No acute cervical spine fracture or traumatic listhesis. 4. Emphysema (ICD10-J43.9). Critical Value/emergent results were called by telephone at the time of interpretation on 04/16/2022 at 11:17 am to provider DAN FLOYD , who verbally acknowledged these results. Electronically Signed   By: Titus Dubin M.D.   On: 04/16/2022 11:34   DG Chest Portable 1 View  Result Date: 04/16/2022 CLINICAL DATA:  Fall EXAM: PORTABLE CHEST 1 VIEW COMPARISON:  04/04/2022 FINDINGS: Heart size is normal. Aortic atherosclerosis. Postoperative changes in the left perihilar region. Mildly coarsened bibasilar interstitial markings. Lungs are hyperinflated. No new airspace consolidation. No pleural effusion or pneumothorax. Shunt catheter tubing descends the right chest wall. IMPRESSION: No active disease. Electronically Signed   By: Davina Poke D.O.   On: 04/16/2022 10:35   DG Shoulder Left  Result Date: 04/20/2022 CLINICAL DATA:  Left shoulder pain EXAM: LEFT SHOULDER - 2+ VIEW COMPARISON:  11/07/2021 FINDINGS: Bones are mildly demineralized. There is no evidence of fracture or dislocation. Mild AC arthropathy. Glenohumeral joint space appears preserved. Soft tissues are unremarkable. Thoracic aortic atherosclerosis. IMPRESSION: Negative. Electronically Signed   By: Davina Poke D.O.   On: 04/20/2022 16:51   CT Maxillofacial Wo Contrast  Result Date: 04/16/2022 CLINICAL DATA:  Facial trauma, blunt EXAM: CT MAXILLOFACIAL WITHOUT CONTRAST TECHNIQUE: Multidetector CT imaging of the maxillofacial structures was performed. Multiplanar CT image reconstructions were also generated. RADIATION DOSE REDUCTION: This exam  was performed according to the departmental dose-optimization program which includes automated exposure control, adjustment of the mA and/or kV according to patient size and/or use of iterative reconstruction technique. COMPARISON:  11/04/2021 FINDINGS: Osseous: No acute maxillofacial bone fracture. Bony orbital walls are intact. Mandible intact. Temporomandibular joints are aligned without dislocation. Orbits: Negative. No traumatic or inflammatory finding. Sinuses: Clear. Soft tissues: No focal soft tissue swelling or hematoma. Limited intracranial: See dedicated CT head report. IMPRESSION: No acute maxillofacial bone fracture. Electronically Signed   By: Davina Poke D.O.   On: 04/16/2022 11:57    Antibiotics:  Anti-infectives (From admission, onward)    None       Discharge Exam: Blood pressure (!) 118/51, pulse 89, temperature 98.2 F (36.8 C), temperature source Oral, resp. rate 14, height 5\' 3"  (1.6 m), weight 50.8 kg, SpO2 99 %. Neurologic: Grossly normal   Discharge Medications:   Allergies as of 04/23/2022       Reactions   Lexapro [escitalopram Oxalate] Other (See Comments)   Dizziness, nausea, fatigue   Neosporin  [neomycin-polymyxin-gramicidin] Rash   (Neosporin)   Neosporin [bacitracin-polymyxin B] Rash        Medication List     STOP taking these medications    alendronate 70 MG tablet Commonly known as: FOSAMAX   CALCIUM PO   clopidogrel 75 MG tablet Commonly known as: PLAVIX   IRON PO   multivitamin capsule       TAKE these medications    Accu-Chek Softclix Lancets lancets Use as instructed   acetaminophen 500 MG tablet Commonly known as: TYLENOL Take 1,000 mg by mouth every 6 (six) hours as needed for moderate pain.   albuterol 108 (90 Base) MCG/ACT inhaler Commonly known as: VENTOLIN HFA INHALE 2 PUFFS BY MOUTH EVERY 4 HOURS AS NEEDED What changed:  how  much to take how to take this when to take this reasons to take  this additional instructions   albuterol (2.5 MG/3ML) 0.083% nebulizer solution Commonly known as: PROVENTIL USE 1 VIAL IN NEBULIZER EVERY 6 HOURS What changed: See the new instructions.   ASHWAGANDHA PO Take 1 capsule by mouth daily as needed (anxiety).   B-12 PO Take 1 capsule by mouth daily.   buPROPion 150 MG 24 hr tablet Commonly known as: Wellbutrin XL 1 qam for 5 days then 2  qam What changed:  how much to take how to take this when to take this additional instructions   busPIRone 15 MG tablet Commonly known as: BUSPAR 3  qam   2 qpm What changed:  how much to take how to take this when to take this additional instructions   clonazePAM 1 MG tablet Commonly known as: KlonoPIN 1 qhs What changed:  how much to take how to take this when to take this additional instructions   folic acid 1 MG tablet Commonly known as: FOLVITE Take 1 tablet (1 mg total) by mouth daily.   glipiZIDE 2.5 MG 24 hr tablet Commonly known as: GLUCOTROL XL TAKE 1 TABLET (2.5 MG TOTAL) BY MOUTH DAILY WITH BREAKFAST. FOR DIABETES.   glucose blood test strip Use as instructed   guaiFENesin 600 MG 12 hr tablet Commonly known as: MUCINEX Take 1 tablet (600 mg total) by mouth 2 (two) times daily.   OXYGEN 3 L continuous.   rosuvastatin 20 MG tablet Commonly known as: CRESTOR TAKE 1 TABLET (20 MG TOTAL) BY MOUTH EVERY EVENING. FOR CHOLESTEROL.   Stiolto Respimat 2.5-2.5 MCG/ACT Aers Generic drug: Tiotropium Bromide-Olodaterol INHALE 2 PUFFS BY MOUTH INTO THE LUNGS DAILY What changed: See the new instructions.   tamsulosin 0.4 MG Caps capsule Commonly known as: FLOMAX Take 1 capsule (0.4 mg total) by mouth daily. For urine retention        Disposition: SNF   Final Dx: SDH  Discharge Instructions     Call MD for:  difficulty breathing, headache or visual disturbances   Complete by: As directed    Call MD for:  hives   Complete by: As directed    Call MD for:   persistant dizziness or light-headedness   Complete by: As directed    Call MD for:  persistant nausea and vomiting   Complete by: As directed    Call MD for:  redness, tenderness, or signs of infection (pain, swelling, redness, odor or green/yellow discharge around incision site)   Complete by: As directed    Call MD for:  severe uncontrolled pain   Complete by: As directed    Call MD for:  temperature >100.4   Complete by: As directed    Diet - low sodium heart healthy   Complete by: As directed    Increase activity slowly   Complete by: As directed    No wound care   Complete by: As directed         Contact information for after-discharge care     Destination     [facility] .   Service: Skilled Nursing Contact information: [place] [phone]                      Signed: Ocie Cornfield [facility] 04/23/2022, 11:09 AM

## 2022-04-23 NOTE — Plan of Care (Signed)
  Problem: Pain Managment: Goal: General experience of comfort will improve Outcome: Progressing   Problem: Safety: Goal: Ability to remain free from injury will improve Outcome: Progressing   Problem: Skin Integrity: Goal: Risk for impaired skin integrity will decrease Outcome: Progressing   

## 2022-04-23 NOTE — TOC Transition Note (Addendum)
Transition of Care [facility]) - CM/SW Discharge Note   Patient Details  Name: ISOLA MEHLMAN MRN: [id number] Date of Birth: 1945-12-14  [facility] CM/SW Contact:  Coralee Pesa, [place] Phone Number: 04/23/2022, 12:16 PM   Clinical Narrative:    Pt to be transported to [facility] via [place]. Nurse to call report to [phone]. Dtr requesting call when pt is picked up.  Final next level of care: Skilled Nursing Facility Barriers to Discharge: Barriers Resolved   Patient Goals and CMS Choice Patient states their goals for this hospitalization and ongoing recovery are:: Patient disoriented. Family involved and wanting physical rehab and urine testing CMS Medicare.gov Compare Post Acute Care list provided to:: Patient Represenative (must comment) Choice offered to / list presented to : Adult Children  Discharge Placement              Patient chooses bed at: [facility] Patient to be transferred to facility by: [facility] Name of family member notified: [name] Patient and family notified of of transfer: 04/23/22  Discharge Plan and Services     Post Acute Care Choice: [place]                               Social Determinants of Health (SDOH) Interventions     Readmission Risk Interventions     View : No data to display.

## 2022-04-23 NOTE — Progress Notes (Signed)
Pt d/c to [facility] via [place].

## 2022-04-23 NOTE — Progress Notes (Signed)
Report called to Nilda Calamity, RN. Daughter aware of d/c. Pt awaiting PTAR for discharge to  [facility].

## 2022-04-23 NOTE — Progress Notes (Deleted)
Report given to Stanford Breed, Therapist, sports at [facility].

## 2022-04-24 ENCOUNTER — Telehealth: Payer: Self-pay | Admitting: Primary Care

## 2022-04-24 DIAGNOSIS — F0390 Unspecified dementia without behavioral disturbance: Secondary | ICD-10-CM | POA: Diagnosis not present

## 2022-04-24 DIAGNOSIS — R4189 Other symptoms and signs involving cognitive functions and awareness: Secondary | ICD-10-CM | POA: Diagnosis not present

## 2022-04-24 DIAGNOSIS — I739 Peripheral vascular disease, unspecified: Secondary | ICD-10-CM | POA: Diagnosis not present

## 2022-04-24 DIAGNOSIS — I6201 Nontraumatic acute subdural hemorrhage: Secondary | ICD-10-CM | POA: Diagnosis not present

## 2022-04-24 DIAGNOSIS — I6350 Cerebral infarction due to unspecified occlusion or stenosis of unspecified cerebral artery: Secondary | ICD-10-CM | POA: Diagnosis not present

## 2022-04-24 DIAGNOSIS — R2681 Unsteadiness on feet: Secondary | ICD-10-CM | POA: Diagnosis not present

## 2022-04-24 DIAGNOSIS — I639 Cerebral infarction, unspecified: Secondary | ICD-10-CM | POA: Diagnosis not present

## 2022-04-24 DIAGNOSIS — M249 Joint derangement, unspecified: Secondary | ICD-10-CM | POA: Diagnosis not present

## 2022-04-24 DIAGNOSIS — J449 Chronic obstructive pulmonary disease, unspecified: Secondary | ICD-10-CM | POA: Diagnosis not present

## 2022-04-24 DIAGNOSIS — S065XAA Traumatic subdural hemorrhage with loss of consciousness status unknown, initial encounter: Secondary | ICD-10-CM | POA: Diagnosis not present

## 2022-04-24 DIAGNOSIS — M6281 Muscle weakness (generalized): Secondary | ICD-10-CM | POA: Diagnosis not present

## 2022-04-24 DIAGNOSIS — D649 Anemia, unspecified: Secondary | ICD-10-CM | POA: Diagnosis not present

## 2022-04-24 DIAGNOSIS — G912 (Idiopathic) normal pressure hydrocephalus: Secondary | ICD-10-CM | POA: Diagnosis not present

## 2022-04-24 DIAGNOSIS — E118 Type 2 diabetes mellitus with unspecified complications: Secondary | ICD-10-CM | POA: Diagnosis not present

## 2022-04-24 DIAGNOSIS — M60812 Other myositis, left shoulder: Secondary | ICD-10-CM | POA: Diagnosis not present

## 2022-04-24 DIAGNOSIS — E119 Type 2 diabetes mellitus without complications: Secondary | ICD-10-CM | POA: Diagnosis not present

## 2022-04-24 NOTE — Telephone Encounter (Signed)
Called daughter she had question about urine culture that was resulted by hospital and who would do treatment. Advised daughter that would be addressed by facility. She will. Reach out to them and let us know if any issues.

## 2022-04-24 NOTE — Telephone Encounter (Signed)
Pts daughter [name] called and said py just got transferred to a skilled nursing facility and she is asking for the nurse to call her back about a abnormal lab result. Call back is [phone]

## 2022-04-25 DIAGNOSIS — R399 Unspecified symptoms and signs involving the genitourinary system: Secondary | ICD-10-CM | POA: Diagnosis not present

## 2022-04-25 DIAGNOSIS — Z79899 Other long term (current) drug therapy: Secondary | ICD-10-CM | POA: Diagnosis not present

## 2022-04-25 DIAGNOSIS — F32A Depression, unspecified: Secondary | ICD-10-CM | POA: Diagnosis not present

## 2022-04-29 DIAGNOSIS — R2681 Unsteadiness on feet: Secondary | ICD-10-CM | POA: Diagnosis not present

## 2022-04-29 DIAGNOSIS — M6281 Muscle weakness (generalized): Secondary | ICD-10-CM | POA: Diagnosis not present

## 2022-04-29 DIAGNOSIS — F0390 Unspecified dementia without behavioral disturbance: Secondary | ICD-10-CM | POA: Diagnosis not present

## 2022-04-29 DIAGNOSIS — I639 Cerebral infarction, unspecified: Secondary | ICD-10-CM | POA: Diagnosis not present

## 2022-04-29 DIAGNOSIS — M60812 Other myositis, left shoulder: Secondary | ICD-10-CM | POA: Diagnosis not present

## 2022-04-29 DIAGNOSIS — G912 (Idiopathic) normal pressure hydrocephalus: Secondary | ICD-10-CM | POA: Diagnosis not present

## 2022-04-29 DIAGNOSIS — E119 Type 2 diabetes mellitus without complications: Secondary | ICD-10-CM | POA: Diagnosis not present

## 2022-04-29 DIAGNOSIS — I6201 Nontraumatic acute subdural hemorrhage: Secondary | ICD-10-CM | POA: Diagnosis not present

## 2022-04-30 DIAGNOSIS — F331 Major depressive disorder, recurrent, moderate: Secondary | ICD-10-CM | POA: Diagnosis not present

## 2022-04-30 DIAGNOSIS — F03B18 Unspecified dementia, moderate, with other behavioral disturbance: Secondary | ICD-10-CM | POA: Diagnosis not present

## 2022-04-30 DIAGNOSIS — F411 Generalized anxiety disorder: Secondary | ICD-10-CM | POA: Diagnosis not present

## 2022-05-01 DIAGNOSIS — M60812 Other myositis, left shoulder: Secondary | ICD-10-CM | POA: Diagnosis not present

## 2022-05-01 DIAGNOSIS — I639 Cerebral infarction, unspecified: Secondary | ICD-10-CM | POA: Diagnosis not present

## 2022-05-01 DIAGNOSIS — F0390 Unspecified dementia without behavioral disturbance: Secondary | ICD-10-CM | POA: Diagnosis not present

## 2022-05-01 DIAGNOSIS — G912 (Idiopathic) normal pressure hydrocephalus: Secondary | ICD-10-CM | POA: Diagnosis not present

## 2022-05-01 DIAGNOSIS — E119 Type 2 diabetes mellitus without complications: Secondary | ICD-10-CM | POA: Diagnosis not present

## 2022-05-01 DIAGNOSIS — R2681 Unsteadiness on feet: Secondary | ICD-10-CM | POA: Diagnosis not present

## 2022-05-01 DIAGNOSIS — I6201 Nontraumatic acute subdural hemorrhage: Secondary | ICD-10-CM | POA: Diagnosis not present

## 2022-05-01 DIAGNOSIS — M6281 Muscle weakness (generalized): Secondary | ICD-10-CM | POA: Diagnosis not present

## 2022-05-02 DIAGNOSIS — I6201 Nontraumatic acute subdural hemorrhage: Secondary | ICD-10-CM | POA: Diagnosis not present

## 2022-05-02 DIAGNOSIS — M6281 Muscle weakness (generalized): Secondary | ICD-10-CM | POA: Diagnosis not present

## 2022-05-02 DIAGNOSIS — E119 Type 2 diabetes mellitus without complications: Secondary | ICD-10-CM | POA: Diagnosis not present

## 2022-05-02 DIAGNOSIS — M60812 Other myositis, left shoulder: Secondary | ICD-10-CM | POA: Diagnosis not present

## 2022-05-02 DIAGNOSIS — F0390 Unspecified dementia without behavioral disturbance: Secondary | ICD-10-CM | POA: Diagnosis not present

## 2022-05-02 DIAGNOSIS — I639 Cerebral infarction, unspecified: Secondary | ICD-10-CM | POA: Diagnosis not present

## 2022-05-02 DIAGNOSIS — R2681 Unsteadiness on feet: Secondary | ICD-10-CM | POA: Diagnosis not present

## 2022-05-02 DIAGNOSIS — G912 (Idiopathic) normal pressure hydrocephalus: Secondary | ICD-10-CM | POA: Diagnosis not present

## 2022-05-05 DIAGNOSIS — E119 Type 2 diabetes mellitus without complications: Secondary | ICD-10-CM | POA: Diagnosis not present

## 2022-05-05 DIAGNOSIS — G912 (Idiopathic) normal pressure hydrocephalus: Secondary | ICD-10-CM | POA: Diagnosis not present

## 2022-05-05 DIAGNOSIS — I6201 Nontraumatic acute subdural hemorrhage: Secondary | ICD-10-CM | POA: Diagnosis not present

## 2022-05-05 DIAGNOSIS — J449 Chronic obstructive pulmonary disease, unspecified: Secondary | ICD-10-CM | POA: Diagnosis not present

## 2022-05-05 DIAGNOSIS — E118 Type 2 diabetes mellitus with unspecified complications: Secondary | ICD-10-CM | POA: Diagnosis not present

## 2022-05-05 DIAGNOSIS — M6281 Muscle weakness (generalized): Secondary | ICD-10-CM | POA: Diagnosis not present

## 2022-05-05 DIAGNOSIS — M60812 Other myositis, left shoulder: Secondary | ICD-10-CM | POA: Diagnosis not present

## 2022-05-05 DIAGNOSIS — R682 Dry mouth, unspecified: Secondary | ICD-10-CM | POA: Diagnosis not present

## 2022-05-05 DIAGNOSIS — I6930 Unspecified sequelae of cerebral infarction: Secondary | ICD-10-CM | POA: Diagnosis not present

## 2022-05-05 DIAGNOSIS — R2681 Unsteadiness on feet: Secondary | ICD-10-CM | POA: Diagnosis not present

## 2022-05-05 DIAGNOSIS — I639 Cerebral infarction, unspecified: Secondary | ICD-10-CM | POA: Diagnosis not present

## 2022-05-05 DIAGNOSIS — F0390 Unspecified dementia without behavioral disturbance: Secondary | ICD-10-CM | POA: Diagnosis not present

## 2022-05-08 DIAGNOSIS — S065XAA Traumatic subdural hemorrhage with loss of consciousness status unknown, initial encounter: Secondary | ICD-10-CM | POA: Diagnosis not present

## 2022-05-08 DIAGNOSIS — F32A Depression, unspecified: Secondary | ICD-10-CM | POA: Diagnosis not present

## 2022-05-08 DIAGNOSIS — D649 Anemia, unspecified: Secondary | ICD-10-CM | POA: Diagnosis not present

## 2022-05-08 DIAGNOSIS — J449 Chronic obstructive pulmonary disease, unspecified: Secondary | ICD-10-CM | POA: Diagnosis not present

## 2022-05-08 DIAGNOSIS — I739 Peripheral vascular disease, unspecified: Secondary | ICD-10-CM | POA: Diagnosis not present

## 2022-05-10 DIAGNOSIS — I6201 Nontraumatic acute subdural hemorrhage: Secondary | ICD-10-CM | POA: Diagnosis not present

## 2022-05-10 DIAGNOSIS — G912 (Idiopathic) normal pressure hydrocephalus: Secondary | ICD-10-CM | POA: Diagnosis not present

## 2022-05-10 DIAGNOSIS — M6281 Muscle weakness (generalized): Secondary | ICD-10-CM | POA: Diagnosis not present

## 2022-05-10 DIAGNOSIS — I639 Cerebral infarction, unspecified: Secondary | ICD-10-CM | POA: Diagnosis not present

## 2022-05-10 DIAGNOSIS — E119 Type 2 diabetes mellitus without complications: Secondary | ICD-10-CM | POA: Diagnosis not present

## 2022-05-10 DIAGNOSIS — F0390 Unspecified dementia without behavioral disturbance: Secondary | ICD-10-CM | POA: Diagnosis not present

## 2022-05-10 DIAGNOSIS — M60812 Other myositis, left shoulder: Secondary | ICD-10-CM | POA: Diagnosis not present

## 2022-05-10 DIAGNOSIS — R2681 Unsteadiness on feet: Secondary | ICD-10-CM | POA: Diagnosis not present

## 2022-05-13 ENCOUNTER — Telehealth: Payer: Self-pay | Admitting: Primary Care

## 2022-05-13 NOTE — Telephone Encounter (Signed)
[place] [phone] (secure line)  Verbal orders  Home health PT  Frequency  2x wk for 7 wks 1x weekly for two weeks  Reason: strengthening, transfers, balance, and gait training

## 2022-05-13 NOTE — Telephone Encounter (Signed)
Agree with HH orders as requested

## 2022-05-13 NOTE — Telephone Encounter (Signed)
Called and verbal orders given. No further questions now

## 2022-05-14 ENCOUNTER — Telehealth: Payer: Self-pay | Admitting: Primary Care

## 2022-05-14 DIAGNOSIS — R531 Weakness: Secondary | ICD-10-CM | POA: Diagnosis not present

## 2022-05-14 DIAGNOSIS — I4510 Unspecified right bundle-branch block: Secondary | ICD-10-CM | POA: Diagnosis not present

## 2022-05-14 DIAGNOSIS — G912 (Idiopathic) normal pressure hydrocephalus: Secondary | ICD-10-CM | POA: Diagnosis not present

## 2022-05-14 DIAGNOSIS — R29898 Other symptoms and signs involving the musculoskeletal system: Secondary | ICD-10-CM | POA: Diagnosis not present

## 2022-05-14 DIAGNOSIS — R4701 Aphasia: Secondary | ICD-10-CM | POA: Diagnosis not present

## 2022-05-14 DIAGNOSIS — R918 Other nonspecific abnormal finding of lung field: Secondary | ICD-10-CM | POA: Diagnosis not present

## 2022-05-14 DIAGNOSIS — Z87891 Personal history of nicotine dependence: Secondary | ICD-10-CM | POA: Diagnosis not present

## 2022-05-14 DIAGNOSIS — R1310 Dysphagia, unspecified: Secondary | ICD-10-CM | POA: Diagnosis not present

## 2022-05-14 DIAGNOSIS — F05 Delirium due to known physiological condition: Secondary | ICD-10-CM | POA: Diagnosis not present

## 2022-05-14 DIAGNOSIS — J449 Chronic obstructive pulmonary disease, unspecified: Secondary | ICD-10-CM | POA: Diagnosis not present

## 2022-05-14 DIAGNOSIS — R200 Anesthesia of skin: Secondary | ICD-10-CM | POA: Diagnosis not present

## 2022-05-14 DIAGNOSIS — D649 Anemia, unspecified: Secondary | ICD-10-CM | POA: Diagnosis not present

## 2022-05-14 DIAGNOSIS — G459 Transient cerebral ischemic attack, unspecified: Secondary | ICD-10-CM | POA: Diagnosis not present

## 2022-05-14 DIAGNOSIS — D638 Anemia in other chronic diseases classified elsewhere: Secondary | ICD-10-CM | POA: Diagnosis not present

## 2022-05-14 DIAGNOSIS — R479 Unspecified speech disturbances: Secondary | ICD-10-CM | POA: Diagnosis not present

## 2022-05-14 DIAGNOSIS — J439 Emphysema, unspecified: Secondary | ICD-10-CM | POA: Diagnosis not present

## 2022-05-14 DIAGNOSIS — E119 Type 2 diabetes mellitus without complications: Secondary | ICD-10-CM | POA: Diagnosis not present

## 2022-05-14 NOTE — Telephone Encounter (Signed)
Called left message with information on secured voicemail.

## 2022-05-14 NOTE — Telephone Encounter (Signed)
Agree with HH orders as requested

## 2022-05-14 NOTE — Telephone Encounter (Signed)
[place] Name: [place] Agency Name: [facility] Phone #: [phone] (Secure line) Service Requested: Speech Therapy (examples: OT/PT/Skilled Nursing/Social Work/Speech Therapy/Wound Care) Frequency of Visits: 1 time a week for 6 weeks

## 2022-05-14 NOTE — Telephone Encounter (Signed)
Pt daughter called back and said this cant wait and that she needs it done sooner, Please see if another provider can fill out form

## 2022-05-14 NOTE — Telephone Encounter (Signed)
As provider is out of the office she can schedule an office visit with another provider to complete paperwork and get status update on patient

## 2022-05-15 DIAGNOSIS — D638 Anemia in other chronic diseases classified elsewhere: Secondary | ICD-10-CM | POA: Diagnosis not present

## 2022-05-15 DIAGNOSIS — I619 Nontraumatic intracerebral hemorrhage, unspecified: Secondary | ICD-10-CM | POA: Diagnosis not present

## 2022-05-15 DIAGNOSIS — Z982 Presence of cerebrospinal fluid drainage device: Secondary | ICD-10-CM | POA: Diagnosis not present

## 2022-05-15 DIAGNOSIS — R1310 Dysphagia, unspecified: Secondary | ICD-10-CM | POA: Diagnosis not present

## 2022-05-15 DIAGNOSIS — R4701 Aphasia: Secondary | ICD-10-CM | POA: Diagnosis not present

## 2022-05-15 DIAGNOSIS — R4781 Slurred speech: Secondary | ICD-10-CM | POA: Diagnosis not present

## 2022-05-15 DIAGNOSIS — F05 Delirium due to known physiological condition: Secondary | ICD-10-CM | POA: Diagnosis not present

## 2022-05-15 DIAGNOSIS — I071 Rheumatic tricuspid insufficiency: Secondary | ICD-10-CM | POA: Diagnosis not present

## 2022-05-15 DIAGNOSIS — R531 Weakness: Secondary | ICD-10-CM | POA: Diagnosis not present

## 2022-05-15 DIAGNOSIS — I6523 Occlusion and stenosis of bilateral carotid arteries: Secondary | ICD-10-CM | POA: Diagnosis not present

## 2022-05-15 DIAGNOSIS — G912 (Idiopathic) normal pressure hydrocephalus: Secondary | ICD-10-CM | POA: Diagnosis not present

## 2022-05-15 DIAGNOSIS — J449 Chronic obstructive pulmonary disease, unspecified: Secondary | ICD-10-CM | POA: Diagnosis not present

## 2022-05-15 DIAGNOSIS — E119 Type 2 diabetes mellitus without complications: Secondary | ICD-10-CM | POA: Diagnosis not present

## 2022-05-15 DIAGNOSIS — I272 Pulmonary hypertension, unspecified: Secondary | ICD-10-CM | POA: Diagnosis not present

## 2022-05-15 DIAGNOSIS — R29810 Facial weakness: Secondary | ICD-10-CM | POA: Diagnosis not present

## 2022-05-15 DIAGNOSIS — G459 Transient cerebral ischemic attack, unspecified: Secondary | ICD-10-CM | POA: Diagnosis not present

## 2022-05-15 DIAGNOSIS — I6200 Nontraumatic subdural hemorrhage, unspecified: Secondary | ICD-10-CM | POA: Diagnosis not present

## 2022-05-15 DIAGNOSIS — R918 Other nonspecific abnormal finding of lung field: Secondary | ICD-10-CM | POA: Diagnosis not present

## 2022-05-16 ENCOUNTER — Other Ambulatory Visit: Payer: Self-pay | Admitting: Primary Care

## 2022-05-16 DIAGNOSIS — R1310 Dysphagia, unspecified: Secondary | ICD-10-CM | POA: Diagnosis not present

## 2022-05-16 DIAGNOSIS — R918 Other nonspecific abnormal finding of lung field: Secondary | ICD-10-CM | POA: Diagnosis not present

## 2022-05-16 DIAGNOSIS — Z982 Presence of cerebrospinal fluid drainage device: Secondary | ICD-10-CM | POA: Diagnosis not present

## 2022-05-16 DIAGNOSIS — G459 Transient cerebral ischemic attack, unspecified: Secondary | ICD-10-CM | POA: Diagnosis not present

## 2022-05-16 DIAGNOSIS — J449 Chronic obstructive pulmonary disease, unspecified: Secondary | ICD-10-CM | POA: Diagnosis not present

## 2022-05-16 DIAGNOSIS — F05 Delirium due to known physiological condition: Secondary | ICD-10-CM | POA: Diagnosis not present

## 2022-05-16 DIAGNOSIS — G912 (Idiopathic) normal pressure hydrocephalus: Secondary | ICD-10-CM | POA: Diagnosis not present

## 2022-05-16 DIAGNOSIS — R4701 Aphasia: Secondary | ICD-10-CM | POA: Diagnosis not present

## 2022-05-16 DIAGNOSIS — E119 Type 2 diabetes mellitus without complications: Secondary | ICD-10-CM | POA: Diagnosis not present

## 2022-05-16 DIAGNOSIS — D638 Anemia in other chronic diseases classified elsewhere: Secondary | ICD-10-CM | POA: Diagnosis not present

## 2022-05-17 DIAGNOSIS — F0390 Unspecified dementia without behavioral disturbance: Secondary | ICD-10-CM | POA: Diagnosis not present

## 2022-05-17 DIAGNOSIS — F05 Delirium due to known physiological condition: Secondary | ICD-10-CM | POA: Diagnosis not present

## 2022-05-17 DIAGNOSIS — R4701 Aphasia: Secondary | ICD-10-CM | POA: Diagnosis not present

## 2022-05-17 DIAGNOSIS — E119 Type 2 diabetes mellitus without complications: Secondary | ICD-10-CM | POA: Diagnosis not present

## 2022-05-17 DIAGNOSIS — R1310 Dysphagia, unspecified: Secondary | ICD-10-CM | POA: Diagnosis not present

## 2022-05-17 DIAGNOSIS — G459 Transient cerebral ischemic attack, unspecified: Secondary | ICD-10-CM | POA: Diagnosis not present

## 2022-05-17 DIAGNOSIS — D638 Anemia in other chronic diseases classified elsewhere: Secondary | ICD-10-CM | POA: Diagnosis not present

## 2022-05-17 DIAGNOSIS — G912 (Idiopathic) normal pressure hydrocephalus: Secondary | ICD-10-CM | POA: Diagnosis not present

## 2022-05-17 DIAGNOSIS — J449 Chronic obstructive pulmonary disease, unspecified: Secondary | ICD-10-CM | POA: Diagnosis not present

## 2022-05-17 DIAGNOSIS — Z982 Presence of cerebrospinal fluid drainage device: Secondary | ICD-10-CM | POA: Diagnosis not present

## 2022-05-17 DIAGNOSIS — R918 Other nonspecific abnormal finding of lung field: Secondary | ICD-10-CM | POA: Diagnosis not present

## 2022-05-18 DIAGNOSIS — J449 Chronic obstructive pulmonary disease, unspecified: Secondary | ICD-10-CM | POA: Diagnosis not present

## 2022-05-18 DIAGNOSIS — G459 Transient cerebral ischemic attack, unspecified: Secondary | ICD-10-CM | POA: Diagnosis not present

## 2022-05-18 DIAGNOSIS — R918 Other nonspecific abnormal finding of lung field: Secondary | ICD-10-CM | POA: Diagnosis not present

## 2022-05-18 DIAGNOSIS — G912 (Idiopathic) normal pressure hydrocephalus: Secondary | ICD-10-CM | POA: Diagnosis not present

## 2022-05-18 DIAGNOSIS — D638 Anemia in other chronic diseases classified elsewhere: Secondary | ICD-10-CM | POA: Diagnosis not present

## 2022-05-18 DIAGNOSIS — R4701 Aphasia: Secondary | ICD-10-CM | POA: Diagnosis not present

## 2022-05-18 DIAGNOSIS — F05 Delirium due to known physiological condition: Secondary | ICD-10-CM | POA: Diagnosis not present

## 2022-05-18 DIAGNOSIS — R1310 Dysphagia, unspecified: Secondary | ICD-10-CM | POA: Diagnosis not present

## 2022-05-18 DIAGNOSIS — Z982 Presence of cerebrospinal fluid drainage device: Secondary | ICD-10-CM | POA: Diagnosis not present

## 2022-05-18 DIAGNOSIS — E119 Type 2 diabetes mellitus without complications: Secondary | ICD-10-CM | POA: Diagnosis not present

## 2022-05-18 DIAGNOSIS — F0390 Unspecified dementia without behavioral disturbance: Secondary | ICD-10-CM | POA: Diagnosis not present

## 2022-05-19 DIAGNOSIS — J449 Chronic obstructive pulmonary disease, unspecified: Secondary | ICD-10-CM | POA: Diagnosis not present

## 2022-05-19 DIAGNOSIS — D638 Anemia in other chronic diseases classified elsewhere: Secondary | ICD-10-CM | POA: Diagnosis not present

## 2022-05-19 DIAGNOSIS — Z982 Presence of cerebrospinal fluid drainage device: Secondary | ICD-10-CM | POA: Diagnosis not present

## 2022-05-19 DIAGNOSIS — E119 Type 2 diabetes mellitus without complications: Secondary | ICD-10-CM | POA: Diagnosis not present

## 2022-05-19 DIAGNOSIS — G459 Transient cerebral ischemic attack, unspecified: Secondary | ICD-10-CM | POA: Diagnosis not present

## 2022-05-19 DIAGNOSIS — F05 Delirium due to known physiological condition: Secondary | ICD-10-CM | POA: Diagnosis not present

## 2022-05-19 DIAGNOSIS — R4701 Aphasia: Secondary | ICD-10-CM | POA: Diagnosis not present

## 2022-05-19 DIAGNOSIS — R1310 Dysphagia, unspecified: Secondary | ICD-10-CM | POA: Diagnosis not present

## 2022-05-19 DIAGNOSIS — R918 Other nonspecific abnormal finding of lung field: Secondary | ICD-10-CM | POA: Diagnosis not present

## 2022-05-19 DIAGNOSIS — G912 (Idiopathic) normal pressure hydrocephalus: Secondary | ICD-10-CM | POA: Diagnosis not present

## 2022-05-21 DIAGNOSIS — J449 Chronic obstructive pulmonary disease, unspecified: Secondary | ICD-10-CM | POA: Diagnosis not present

## 2022-05-21 DIAGNOSIS — Z9981 Dependence on supplemental oxygen: Secondary | ICD-10-CM | POA: Diagnosis not present

## 2022-05-21 DIAGNOSIS — Z8673 Personal history of transient ischemic attack (TIA), and cerebral infarction without residual deficits: Secondary | ICD-10-CM | POA: Diagnosis not present

## 2022-05-21 DIAGNOSIS — G459 Transient cerebral ischemic attack, unspecified: Secondary | ICD-10-CM | POA: Diagnosis not present

## 2022-05-26 DIAGNOSIS — G912 (Idiopathic) normal pressure hydrocephalus: Secondary | ICD-10-CM | POA: Diagnosis not present

## 2022-05-26 DIAGNOSIS — Z87820 Personal history of traumatic brain injury: Secondary | ICD-10-CM | POA: Diagnosis not present

## 2022-05-26 DIAGNOSIS — J449 Chronic obstructive pulmonary disease, unspecified: Secondary | ICD-10-CM | POA: Diagnosis not present

## 2022-05-26 DIAGNOSIS — Z85118 Personal history of other malignant neoplasm of bronchus and lung: Secondary | ICD-10-CM | POA: Diagnosis not present

## 2022-05-26 DIAGNOSIS — Z982 Presence of cerebrospinal fluid drainage device: Secondary | ICD-10-CM | POA: Diagnosis not present

## 2022-05-26 DIAGNOSIS — Z79899 Other long term (current) drug therapy: Secondary | ICD-10-CM | POA: Diagnosis not present

## 2022-05-26 DIAGNOSIS — I6529 Occlusion and stenosis of unspecified carotid artery: Secondary | ICD-10-CM | POA: Diagnosis not present

## 2022-05-26 DIAGNOSIS — M79601 Pain in right arm: Secondary | ICD-10-CM | POA: Diagnosis not present

## 2022-05-26 DIAGNOSIS — R29898 Other symptoms and signs involving the musculoskeletal system: Secondary | ICD-10-CM | POA: Diagnosis not present

## 2022-05-26 DIAGNOSIS — I69354 Hemiplegia and hemiparesis following cerebral infarction affecting left non-dominant side: Secondary | ICD-10-CM | POA: Diagnosis not present

## 2022-05-26 DIAGNOSIS — Z9181 History of falling: Secondary | ICD-10-CM | POA: Diagnosis not present

## 2022-05-27 DIAGNOSIS — I10 Essential (primary) hypertension: Secondary | ICD-10-CM | POA: Diagnosis not present

## 2022-05-27 DIAGNOSIS — I6200 Nontraumatic subdural hemorrhage, unspecified: Secondary | ICD-10-CM | POA: Diagnosis not present

## 2022-05-27 DIAGNOSIS — F419 Anxiety disorder, unspecified: Secondary | ICD-10-CM | POA: Diagnosis not present

## 2022-05-28 ENCOUNTER — Ambulatory Visit: Payer: Medicare PPO | Admitting: Primary Care

## 2022-05-28 NOTE — Telephone Encounter (Signed)
Form completed and placed in Amanda Porter's inbox.

## 2022-06-06 ENCOUNTER — Encounter: Payer: Self-pay | Admitting: Nurse Practitioner

## 2022-06-06 ENCOUNTER — Ambulatory Visit (INDEPENDENT_AMBULATORY_CARE_PROVIDER_SITE_OTHER): Payer: Medicare PPO | Admitting: Nurse Practitioner

## 2022-06-06 VITALS — BP 118/54 | HR 98 | Temp 98.4°F | Resp 16 | Ht 63.0 in | Wt 110.4 lb

## 2022-06-06 DIAGNOSIS — N3090 Cystitis, unspecified without hematuria: Secondary | ICD-10-CM | POA: Insufficient documentation

## 2022-06-06 DIAGNOSIS — N3944 Nocturnal enuresis: Secondary | ICD-10-CM | POA: Diagnosis not present

## 2022-06-06 DIAGNOSIS — R350 Frequency of micturition: Secondary | ICD-10-CM | POA: Diagnosis not present

## 2022-06-06 DIAGNOSIS — R8290 Unspecified abnormal findings in urine: Secondary | ICD-10-CM

## 2022-06-06 DIAGNOSIS — Z760 Encounter for issue of repeat prescription: Secondary | ICD-10-CM | POA: Diagnosis not present

## 2022-06-06 LAB — POCT URINALYSIS DIP (CLINITEK)
Bilirubin, UA: NEGATIVE
Glucose, UA: 100 mg/dL — AB
Nitrite, UA: NEGATIVE
POC PROTEIN,UA: 100 — AB
Spec Grav, UA: >=1.03 — AB (ref 1.010–1.025)
Urobilinogen, UA: 0.2 E.U./dL
pH, UA: 6 (ref 5.0–8.0)

## 2022-06-06 MED ORDER — MEMANTINE HCL 5 MG PO TABS: 5.0000 mg | ORAL_TABLET | Freq: Two times a day (BID) | ORAL | 0 refills | Status: DC

## 2022-06-06 MED ORDER — SULFAMETHOXAZOLE-TRIMETHOPRIM 800-160 MG PO TABS: 1.0000 | ORAL_TABLET | Freq: Two times a day (BID) | ORAL | 0 refills | Status: AC

## 2022-06-06 NOTE — Progress Notes (Signed)
Acute Office Visit  Subjective:     Patient ID: Amanda Porter, female    DOB: 1945-12-11, 76 y.o.   MRN: [id number]  Chief Complaint  Patient presents with   Urinary Tract Infection    X 1 week     Patient is in today for [place] symptoms  Patient is accompanied by her daughter who contributes large part of the history.  States that she was discharged on 05/19/2022 at [facility] with cefdinir biotic.  Patient finished antibiotic is seen to return to baseline.  States over the past week there has been an increase in frequeny, urine odor nocturnal incontinence and urgency . Daughter states that normal signs and symptoms of urinary tract infection includes confusion which daughter states is present.   [facility] June 10th  Came home on 05/19/2022  Review of Systems  Constitutional:  Negative for chills and fever.  Gastrointestinal:  Negative for abdominal pain, nausea and vomiting.  Genitourinary:  Positive for frequency and urgency. Negative for dysuria, flank pain and hematuria.  Neurological:  Negative for weakness.  Psychiatric/Behavioral:  Positive for memory loss.        "+" Confusion        Objective:    BP (!) 118/54   Pulse 98   Temp 98.4 F (36.9 C)   Resp 16   Ht 5\' 3"  (1.6 m)   Wt 110 lb 6 oz (50.1 kg)   SpO2 97%   BMI 19.55 kg/m    Physical Exam Vitals and nursing note reviewed.  Constitutional:      Appearance: Normal appearance.  Cardiovascular:     Rate and Rhythm: Normal rate and regular rhythm.     Heart sounds: Normal heart sounds.  Pulmonary:     Effort: Pulmonary effort is normal.     Breath sounds: Normal breath sounds.     Comments: On O2 Abdominal:     General: Bowel sounds are normal. There is no distension.     Palpations: There is no mass.     Tenderness: There is no abdominal tenderness. There is no right CVA tenderness or left CVA tenderness.     Hernia: No hernia is present.  Neurological:     Mental Status: She  is alert.     Results for orders placed or performed in visit on 06/06/22  POCT URINALYSIS DIP (CLINITEK)  Result Value Ref Range   Color, UA straw (A) yellow   Clarity, UA cloudy (A) clear   Glucose, UA =100 (A) negative mg/dL   Bilirubin, UA negative negative   Ketones, POC UA trace (5) (A) negative mg/dL   Spec Grav, UA >=1.030 (A) 1.010 - 1.025   Blood, UA large (A) negative   pH, UA 6.0 5.0 - 8.0   POC PROTEIN,UA =100 (A) negative, trace   Urobilinogen, UA 0.2 0.2 or 1.0 E.U./dL   Nitrite, UA Negative Negative   Leukocytes, UA Small (1+) (A) Negative        Assessment & Plan:   Problem List Items Addressed This Visit       Genitourinary   Cystitis    Given signs symptoms and UA will elect to treat with Bactrim twice daily for 3 days.  We are pending urine culture      Relevant Medications   sulfamethoxazole-trimethoprim (BACTRIM DS) 800-160 MG tablet     Other   Abnormal urinalysis    Did have small leukocytes, moderate blood, and glucose.  Patient is  a known diabetic      Nocturnal enuresis    Patient has been wetting the bed regardless of toileting scheduling.  UA indicative of urinary tract infection elect to treat      Urinary frequency - Primary    UA abnormal with leukocytes, blood, glucose      Relevant Orders   POCT URINALYSIS DIP (CLINITEK) (Completed)   Urine Culture   Medication refill    She was recently discharged from the [facility] where they started Namenda 5 mg twice daily.  Patient's daughter is requesting a refill states she is unsure if medications help because she is having confusion with recurrent UTIs.  30-day supply given until patient can follow-up with primary care provider      Relevant Medications   memantine (NAMENDA) 5 MG tablet    Meds ordered this encounter  Medications   sulfamethoxazole-trimethoprim (BACTRIM DS) 800-160 MG tablet    Sig: Take 1 tablet by mouth 2 (two) times daily for 3 days.    Dispense:  6  tablet    Refill:  0    Order Specific Question:   Supervising Provider    Answer:   Glori Bickers MARNE A [[id number]]   memantine (NAMENDA) 5 MG tablet    Sig: Take 1 tablet (5 mg total) by mouth 2 (two) times daily.    Dispense:  60 tablet    Refill:  0    Order Specific Question:   Supervising Provider    Answer:   Loura Pardon A [[id number]]    Return As scheduled with [name] later this month.  Romilda Garret, NP

## 2022-06-06 NOTE — Assessment & Plan Note (Signed)
UA abnormal with leukocytes, blood, glucose

## 2022-06-06 NOTE — Patient Instructions (Signed)
Nice to see you today I sent an antibiotic to the pharmacy and refilled the [place]. Keep your appt with [name] Follow up if no improvement

## 2022-06-06 NOTE — Assessment & Plan Note (Addendum)
Did have small leukocytes, moderate blood, and glucose.  Patient is a known diabetic

## 2022-06-06 NOTE — Assessment & Plan Note (Signed)
She was recently discharged from the [facility] where they started Namenda 5 mg twice daily.  Patient's daughter is requesting a refill states she is unsure if medications help because she is having confusion with recurrent UTIs.  30-day supply given until patient can follow-up with primary care provider

## 2022-06-06 NOTE — Assessment & Plan Note (Signed)
Given signs symptoms and UA will elect to treat with Bactrim twice daily for 3 days.  We are pending urine culture

## 2022-06-06 NOTE — Assessment & Plan Note (Addendum)
Patient has been wetting the bed regardless of toileting scheduling.  UA indicative of urinary tract infection elect to treat

## 2022-06-09 LAB — URINE CULTURE
MICRO NUMBER:: 13617957
SPECIMEN QUALITY:: ADEQUATE

## 2022-06-10 ENCOUNTER — Telehealth (HOSPITAL_COMMUNITY): Payer: Medicare PPO | Admitting: Psychiatry

## 2022-06-10 ENCOUNTER — Encounter: Payer: Self-pay | Admitting: Nurse Practitioner

## 2022-06-10 DIAGNOSIS — N3090 Cystitis, unspecified without hematuria: Secondary | ICD-10-CM

## 2022-06-10 MED ORDER — CIPROFLOXACIN HCL 250 MG PO TABS: 250.0000 mg | ORAL_TABLET | Freq: Two times a day (BID) | ORAL | 0 refills | Status: DC

## 2022-06-10 NOTE — Telephone Encounter (Signed)
See mychart message will send in an update abx  Can we also let her know that while on cipro it can enhance the effect of the glipizide medication and can cause a decrease in her sugar

## 2022-06-11 ENCOUNTER — Telehealth: Payer: Self-pay | Admitting: Primary Care

## 2022-06-11 NOTE — Telephone Encounter (Signed)
Form faxed to number provided. Conformation received.

## 2022-06-11 NOTE — Telephone Encounter (Signed)
Arnell Asal with [facility] called back and said their fax machine is back up and working and asked if we can resend medical examination report to [phone]. Callback [phone] Direct line

## 2022-06-11 NOTE — Telephone Encounter (Signed)
Faxed with additional information.

## 2022-06-11 NOTE — Telephone Encounter (Signed)
Amanda Porter from [place] called in stating she received the fax that was sent over but it was missing the medication list. Asked to be refaxed to [phone].

## 2022-06-13 MED ORDER — CIPROFLOXACIN HCL 250 MG PO TABS: 250.0000 mg | ORAL_TABLET | Freq: Two times a day (BID) | ORAL | 0 refills | Status: AC

## 2022-06-16 ENCOUNTER — Other Ambulatory Visit: Payer: Self-pay | Admitting: Nurse Practitioner

## 2022-06-16 DIAGNOSIS — Z760 Encounter for issue of repeat prescription: Secondary | ICD-10-CM

## 2022-06-22 ENCOUNTER — Other Ambulatory Visit: Payer: Self-pay | Admitting: Internal Medicine

## 2022-06-22 DIAGNOSIS — E785 Hyperlipidemia, unspecified: Secondary | ICD-10-CM

## 2022-06-24 ENCOUNTER — Encounter: Payer: Self-pay | Admitting: Primary Care

## 2022-06-24 ENCOUNTER — Ambulatory Visit: Payer: Medicare PPO | Admitting: Primary Care

## 2022-06-24 VITALS — BP 140/88 | HR 78 | Temp 98.6°F | Ht 63.0 in | Wt 109.0 lb

## 2022-06-24 DIAGNOSIS — F419 Anxiety disorder, unspecified: Secondary | ICD-10-CM

## 2022-06-24 DIAGNOSIS — R296 Repeated falls: Secondary | ICD-10-CM | POA: Diagnosis not present

## 2022-06-24 DIAGNOSIS — J9611 Chronic respiratory failure with hypoxia: Secondary | ICD-10-CM

## 2022-06-24 DIAGNOSIS — I10 Essential (primary) hypertension: Secondary | ICD-10-CM | POA: Diagnosis not present

## 2022-06-24 DIAGNOSIS — F0390 Unspecified dementia without behavioral disturbance: Secondary | ICD-10-CM | POA: Diagnosis not present

## 2022-06-24 DIAGNOSIS — E785 Hyperlipidemia, unspecified: Secondary | ICD-10-CM

## 2022-06-24 DIAGNOSIS — E1165 Type 2 diabetes mellitus with hyperglycemia: Secondary | ICD-10-CM

## 2022-06-24 DIAGNOSIS — J449 Chronic obstructive pulmonary disease, unspecified: Secondary | ICD-10-CM

## 2022-06-24 DIAGNOSIS — N390 Urinary tract infection, site not specified: Secondary | ICD-10-CM | POA: Insufficient documentation

## 2022-06-24 DIAGNOSIS — S065XAA Traumatic subdural hemorrhage with loss of consciousness status unknown, initial encounter: Secondary | ICD-10-CM

## 2022-06-24 DIAGNOSIS — F32A Depression, unspecified: Secondary | ICD-10-CM

## 2022-06-24 DIAGNOSIS — Z66 Do not resuscitate: Secondary | ICD-10-CM

## 2022-06-24 NOTE — Assessment & Plan Note (Signed)
Secondary to deconditioning/recurrent UTIs.  Referral placed for home health physical therapy. Agree to adult daycare plan given her recurrent falls and the need for monitoring/safety during the day.

## 2022-06-24 NOTE — Assessment & Plan Note (Signed)
Waxes and wanes, stable today.  Continue Namenda 5 mg twice daily. Follow with neurology.

## 2022-06-24 NOTE — Assessment & Plan Note (Signed)
Controlled.  Continue bupropion XL 150 mg daily, buspirone 15 mg twice daily, clonazepam 1 mg as needed.  Follows with psychiatry.

## 2022-06-24 NOTE — Assessment & Plan Note (Signed)
Controlled with recent A1c of 7.2. Continue glipizide XL 2.5 mg daily.

## 2022-06-24 NOTE — Assessment & Plan Note (Signed)
Stable. Follow with pulmonology.  Continue continuous home oxygen at 2 L/min. Continue Stioloto inhaler daily, albuterol PRN.

## 2022-06-24 NOTE — Assessment & Plan Note (Signed)
3 culture positive urinalysis since December 2022. Referral placed to urology for further evaluation.  Await urinalysis results today.

## 2022-06-24 NOTE — Progress Notes (Signed)
Subjective:    Patient ID: Amanda Porter, female    DOB: 21-Jun-1946, 76 y.o.   MRN: [id number]  Medication Refill Pertinent negatives include no chest pain, coughing, fever or headaches.  Shoulder Pain  Pertinent negatives include no fever.    Amanda Porter is a very pleasant 76 y.o. female with a history of hypertension, COPD, chronic respiratory failure with hypoxia, type 2 diabetes, dementia, anxiety and depression, normal pressure hydrocephalus, recurrent falls who presents today for follow-up of chronic conditions.  Her daughter joins [place] today who helps to provide information for HPI.  1) Subarachnoid Hemorrhage/Normal Pressure Hydrocephalus/Dementia: Currently following with Neurology through [facility], last office visit was 05/26/2022.  During this visit her daughter discussed that she is unable to carry out ADLs, requires frequent supervision, intermittent confusion/cognitive impairment.  She was advised by her neurologist to start aspirin 81 mg when possible, continue Crestor 20 mg daily, start PT/OT for left shoulder. Her daughter has yet to hear from PT/OT, is needing a referral today for home health PT.  Also managed on memantine 5 mg BID which has helped with anxiety.  Last lipid panel revealed LDL of 73 from 05/14/2022. She will be seeing a new neurologist soon.   She will be starting soon with an adult daycare. She is in the process in applying for funding. Her daughter is sending her for adult daycare as she cannot be left at home alone given her recurrent falls. She is requesting DNR form to be signed.   BP Readings from Last 3 Encounters:  06/24/22 140/88  06/06/22 (!) 118/54  04/23/22 127/67    2) COPD/Chronic Respiratory Failure with Hypoxia: Currently managed on continuous oxygen at 2 L/min, Stioloto Respimat inhaler, albuterol nebulizer solution, albuterol inhaler as needed.  Following with pulmonology. No recent office visit. Daughter plans to schedule  soon.  3) Type 2 Diabetes: Currently managed on glipizide XL 2.5 mg daily.  Last A1c was 7.2 from 05/14/2022.   4) Anxiety/Depression: Currently managed on bupropion XL 150 mg daily, buspirone 15 mg twice daily, clonazepam 1 mg as needed, Namenda 5 mg BID. Follows with psychiatry. Overall patient feels well managed.   5) Recurrent UTI's: Three culture positive UTI's since December 2022. Most recent UTI was diagnosed on 06/06/22, daughter doesn't feel that the infection has resolved. Symptoms include foul urine smell, urinary incontinence, urinary frequency, changes in cognition. Currently managed on Flomax 0.4 mg daily for which she has been taking since December 2022 for neurogenic bladder, helped initially, but doesn't feel this has helped since.   Review of Systems  Constitutional:  Negative for fever.  Respiratory:  Positive for shortness of breath. Negative for cough.   Cardiovascular:  Negative for chest pain.  Gastrointestinal:  Negative for constipation and diarrhea.  Genitourinary:  Positive for urgency.       See HPI  Neurological:  Negative for headaches.  Psychiatric/Behavioral:  The patient is not nervous/anxious.          Past Medical History:  Diagnosis Date   Acute respiratory failure requiring reintubation ([place]) 11/05/2021   Anxiety    Anxiety and depression    COPD (chronic obstructive pulmonary disease) (HCC)    CVA (cerebral vascular accident) ([place])    CVA (cerebral vascular accident) ([place])    L sided deficits   Dementia ([place])    Depression    Diabetes mellitus ([place])    Type 2   Diabetes mellitus without complication ([place])    Dysphagia  Dysphasia    History of lung cancer    Hyperlipidemia    Influenza A 12/13/2021   Memory disturbance 11/20/2017   Normal pressure hydrocephalus ([place]) 2018   Retinal detachment    Right   Right carotid bruit 11/20/2017   Tardive dyskinesia 02/01/2020   Tardive dyskinesia    Thrombosis    Arterial to lower extremity?     Social History   Socioeconomic History   Marital status: Divorced    Spouse name: Not on file   Number of children: 1   Years of education: trade school   Highest education level: Not on file  Occupational History   Occupation: Retired  Tobacco Use   Smoking status: Former    Packs/day: 0.50    Years: 50.00    Total pack years: 25.00    Types: Cigarettes    Quit date: 08/01/2017    Years since quitting: 4.8   Smokeless tobacco: Never  Vaping Use   Vaping Use: Never used  Substance and Sexual Activity   Alcohol use: Not Currently   Drug use: Not Currently   Sexual activity: Not Currently  Other Topics Concern   Not on file  Social History Narrative   ** Merged History Encounter **       Moved from [place]. Lives with daughter. Right handed    Social Determinants of Health   Financial Resource Strain: Not on file  Food Insecurity: No Food Insecurity (12/27/2021)   Hunger Vital Sign    Worried About Running Out of Food in the Last Year: Never true    Ran Out of Food in the Last Year: Never true  Transportation Needs: No Transportation Needs (12/27/2021)   PRAPARE - Hydrologist (Medical): No    Lack of Transportation (Non-Medical): No  Physical Activity: Not on file  Stress: Not on file  Social Connections: Not on file  Intimate Partner Violence: Not on file    Past Surgical History:  Procedure Laterality Date   LUNG REMOVAL, PARTIAL     left upper lobe   VENTRICULOPERITONEAL SHUNT  12/09/2016   VENTRICULOPERITONEAL SHUNT      Family History  Problem Relation Age of Onset   Pneumonia Mother    Cancer Father    Diabetes Sister    Diabetes Sister    Breast cancer Neg Hx     Allergies  Allergen Reactions   Lexapro [Escitalopram Oxalate] Other (See Comments)    Dizziness, nausea, fatigue   Neosporin  [Neomycin-Polymyxin-Gramicidin] Rash    (Neosporin)   Neosporin [Bacitracin-Polymyxin B] Rash    Current Outpatient  Medications on File Prior to Visit  Medication Sig Dispense Refill   Accu-Chek Softclix Lancets lancets Use as instructed 100 each 12   acetaminophen (TYLENOL) 500 MG tablet Take 1,000 mg by mouth every 6 (six) hours as needed for moderate pain.     albuterol (PROVENTIL) (2.5 MG/3ML) 0.083% nebulizer solution USE 1 VIAL IN NEBULIZER EVERY 6 HOURS (Patient taking differently: Take 2.5 mg by nebulization every 6 (six) hours as needed for wheezing or shortness of breath.) 360 mL 11   albuterol (VENTOLIN HFA) 108 (90 Base) MCG/ACT inhaler INHALE 2 PUFFS BY MOUTH EVERY 4 HOURS AS NEEDED (Patient taking differently: Inhale 2 puffs into the lungs every 4 (four) hours as needed for wheezing or shortness of breath.) 18 g 12   buPROPion (WELLBUTRIN XL) 150 MG 24 hr tablet 1 qam for 5 days then 2  qam (  Patient taking differently: Take 300 mg by mouth daily with breakfast.) 60 tablet 3   busPIRone (BUSPAR) 15 MG tablet 3  qam   2 qpm (Patient taking differently: Take 30 mg by mouth 2 (two) times daily.) 150 tablet 4   clonazePAM (KLONOPIN) 1 MG tablet 1 qhs (Patient taking differently: Take 1 mg by mouth at bedtime.) 30 tablet 4   Cyanocobalamin (B-12 PO) Take 1 capsule by mouth daily.     folic acid (FOLVITE) 1 MG tablet Take 1 tablet (1 mg total) by mouth daily. 90 tablet 3   glipiZIDE (GLUCOTROL XL) 2.5 MG 24 hr tablet TAKE 1 TABLET (2.5 MG TOTAL) BY MOUTH DAILY WITH BREAKFAST. FOR DIABETES. 90 tablet 0   glucose blood test strip Use as instructed 100 each 12   guaiFENesin (MUCINEX) 600 MG 12 hr tablet Take 1 tablet (600 mg total) by mouth 2 (two) times daily. (Patient taking differently: Take 600 mg by mouth as needed.)     memantine (NAMENDA) 5 MG tablet Take 1 tablet (5 mg total) by mouth 2 (two) times daily. 60 tablet 0   OXYGEN 3 L continuous.      rosuvastatin (CRESTOR) 20 MG tablet TAKE 1 TABLET (20 MG TOTAL) BY MOUTH EVERY EVENING. FOR CHOLESTEROL. 90 tablet 3   tamsulosin (FLOMAX) 0.4 MG CAPS  capsule Take 1 capsule (0.4 mg total) by mouth daily. For urine retention 90 capsule 1   Tiotropium Bromide-Olodaterol (STIOLTO RESPIMAT) 2.5-2.5 MCG/ACT AERS INHALE 2 PUFFS BY MOUTH INTO THE LUNGS DAILY (Patient taking differently: Inhale 2 puffs into the lungs daily.) 12 g 4   No current facility-administered medications on file prior to visit.    BP 140/88   Pulse 78   Temp 98.6 F (37 C) (Oral)   Ht 5\' 3"  (1.6 m)   Wt 109 lb (49.4 kg)   SpO2 (!) 88%   BMI 19.31 kg/m  Objective:   Physical Exam Cardiovascular:     Rate and Rhythm: Normal rate and regular rhythm.  Pulmonary:     Effort: Pulmonary effort is normal.     Breath sounds: Normal breath sounds.  Abdominal:     General: Bowel sounds are normal.     Palpations: Abdomen is soft.     Tenderness: There is no abdominal tenderness.  Musculoskeletal:     Cervical back: Neck supple.  Skin:    General: Skin is warm and dry.  Neurological:     Mental Status: She is alert and oriented to person, place, and time.  Psychiatric:        Mood and Affect: Mood normal.           Assessment & Plan:   Problem List Items Addressed This Visit       Cardiovascular and Mediastinum   Essential hypertension    Stable given age.  Continue to monitor.        Respiratory   COPD mixed type (HCC)    Stable. Follow with pulmonology.  Continue continuous home oxygen at 2 L/min. Continue Stioloto inhaler daily, albuterol PRN.      Chronic respiratory failure with hypoxia (HCC)    Stable. Follow with pulmonology.  Continue continuous home oxygen at 2 L/min. Continue Stioloto inhaler daily, albuterol PRN.        Endocrine   Type 2 diabetes mellitus with hyperglycemia (HCC)    Controlled with recent A1c of 7.2. Continue glipizide XL 2.5 mg daily.  Nervous and Auditory   Dementia without behavioral disturbance (HCC)    Waxes and wanes, stable today.  Continue Namenda 5 mg twice daily. Follow with  neurology.        Genitourinary   Recurrent UTI - Primary    3 culture positive urinalysis since December 2022. Referral placed to urology for further evaluation.  Await urinalysis results today.      Relevant Orders   Ambulatory referral to Urology     Other   Hyperlipidemia    LDL overall controlled.  Reviewed lipid panel from care everywhere in June 2023. Continue rosuvastatin 20 mg daily.      Anxiety and depression    Controlled.  Continue bupropion XL 150 mg daily, buspirone 15 mg twice daily, clonazepam 1 mg as needed.  Follows with psychiatry.      Recurrent falls    Secondary to deconditioning/recurrent UTIs.  Referral placed for home health physical therapy. Agree to adult daycare plan given her recurrent falls and the need for monitoring/safety during the day.      Relevant Orders   Ambulatory referral to [facility]   DNR (do not resuscitate)    Agreed upon by both patient and daughter.  Patient able to verbalize understanding of DNR and what this means. Forms completed today.          Pleas Koch, NP

## 2022-06-24 NOTE — Assessment & Plan Note (Signed)
Stable given age.  Continue to monitor.

## 2022-06-24 NOTE — Assessment & Plan Note (Addendum)
Reviewed hospital notes from May 2023, also reviewed neurology notes through care everywhere from June 2023.  Referral placed for home health physical therapy. Approved for adult daycare for monitoring and prevention of falls.

## 2022-06-24 NOTE — Patient Instructions (Signed)
You will be contacted regarding your referral to Urology and for home health physical therapy.  Please let us know if you have not been contacted within two weeks.   It was a pleasure to see you today!

## 2022-06-24 NOTE — Assessment & Plan Note (Signed)
Agreed upon by both patient and daughter.  Patient able to verbalize understanding of DNR and what this means. Forms completed today.

## 2022-06-24 NOTE — Assessment & Plan Note (Signed)
LDL overall controlled.  Reviewed lipid panel from care everywhere in June 2023. Continue rosuvastatin 20 mg daily.

## 2022-06-25 ENCOUNTER — Telehealth: Payer: Self-pay | Admitting: Internal Medicine

## 2022-06-25 DIAGNOSIS — J449 Chronic obstructive pulmonary disease, unspecified: Secondary | ICD-10-CM | POA: Diagnosis not present

## 2022-06-26 NOTE — Telephone Encounter (Signed)
Will notify Dr Bertrum Sol nurse to be on the look out for order request from [facility] on the orders requested.

## 2022-06-27 ENCOUNTER — Encounter: Payer: Self-pay | Admitting: Primary Care

## 2022-06-29 ENCOUNTER — Other Ambulatory Visit (HOSPITAL_COMMUNITY): Payer: Self-pay | Admitting: Psychiatry

## 2022-06-30 ENCOUNTER — Encounter: Payer: Self-pay | Admitting: Internal Medicine

## 2022-06-30 ENCOUNTER — Ambulatory Visit: Payer: Medicare PPO | Admitting: Internal Medicine

## 2022-06-30 VITALS — BP 130/60 | Temp 98.4°F | Ht 62.0 in | Wt 110.0 lb

## 2022-06-30 DIAGNOSIS — R918 Other nonspecific abnormal finding of lung field: Secondary | ICD-10-CM | POA: Diagnosis not present

## 2022-06-30 DIAGNOSIS — J9611 Chronic respiratory failure with hypoxia: Secondary | ICD-10-CM | POA: Diagnosis not present

## 2022-06-30 NOTE — Patient Instructions (Signed)
Order- sample x 2 Stiolto 2.5  Order- record release-  [facility]- need disc and report for CT chest done 05/14/22  Order [employer] DME- Please provide O2 concentrator 3L for use at [facility]      dx Chronic respiratory failure with hypoxia  Order- schedule CT chest no contrast in September   dx Lung nodules left lung noted on [facility] CT chest in June, 2023

## 2022-06-30 NOTE — Progress Notes (Addendum)
MRN# [id number] Amanda Porter 1946/06/10 HPI- Amanda Porter - F former smoker followed for COPD, Chronic hypoxic respiratory failure, complicated by dementia/ TIAs/ Normal Pressure Hydrocephalus/ VP shunt 12/09/16,  LULobectomy for lung Ca 2000, Dysphagia solids/ liquids, Hx DVT/PE/ L femoral embolectomy 1998, DM2 **Desat walk 09/30/17; baseline sat was 95% and HR 83. After walking 360 feet sat was 86% and HR 120, added oxygen at 2L and sat increased to 92%  HST 06/23/2019- AHI 3.9/ hr, desat on room air to 83% with mean sat 89%, body weight 114 lbs.   Recommended continue current sleep O2 2.5L. PFT 04/16/20-Severe obstruction (F/F 0.53, FEV1 43%), Severe restriction (TLC 40%), Severe DLCO deficit (33%) ---------------------------------------------------------------------------------   06/12/21- 75 yoF former smoker (25 pk yrs) followed for COPD, Chronic hypoxic respiratory failure, complicated by dementia/ TIAs/ Normal Pressure Hydrocephalus/ VP shunt 12/09/16, Balance Disorder, Depression, LULobectomy for lung Ca 2000, Dysphagia solids/ liquids, Hx DVT/PE/ L femoral embolectomy 1998, DM2 O2 3L continuous and POC/ APS/ [employer] -Stiolto 2.5, neb albuterol, albuterol hfa Followed by Neurology and Psychiatry                       Daughter here Covid vax- ------Patient feels good overall, feels like breathing is the same since last visit. Wears 3 liters all the time. She has trouble coordinating to operate [employer]. Discussed alternatives.  06/30/22- 76 yoF former smoker (25 pk yrs) followed for COPD, Chronic hypoxic respiratory failure, complicated by dementia/ TIAs/ Normal Pressure Hydrocephalus/ VP shunt 12/09/16, Balance Disorder, Depression, LULobectomy for lung Ca 2000, Dysphagia solids/ liquids, Hx DVT/PE/ L femoral embolectomy 1998, DM2 O2 3L continuous and POC/ APS/ [employer] -Stiolto 2.5, neb albuterol, albuterol hfa Followed by Neurology and Psychiatry                       Daughter  here Covid vax- Reports dementia getting worse. Hosp 5/17-5/24 with subdural hematoma after a fall.  Discussed getting report of CT chest done at [facility] with disk for our review of L lung nodules. We will update in September for stability. LUL 79mm, LLL 5 mm. Need f/u at 3 months = September for CT. Then consider if PET or Bx.   Daughter says [facility] needs script for its own O2 concentrator while she is there (3 days/ week)..  ROS-see HPI   + = positive Constitutional:    weight loss, night sweats, fevers, chills, fatigue, lassitude. HEENT:    headaches, difficulty swallowing, tooth/dental problems, sore throat,       sneezing, itching, ear ache, nasal congestion, post nasal drip, snoring CV:    chest pain, orthopnea, PND, swelling in lower extremities, anasarca,                                   dizziness, palpitations Resp:   +shortness of breath with exertion or at rest.                productive cough,   non-productive cough, coughing up of blood.              change in color of mucus.  +wheezing.   Skin:    rash or lesions. GI:  No-   heartburn, +indigestion, abdominal pain, nausea, vomiting, diarrhea,                 change in bowel habits, loss of appetite GU: dysuria,  change in color of urine, no urgency or frequency.   flank pain. MS:  + joint pain, stiffness, decreased range of motion, back pain. Neuro-     + HPI   Dizzy/ balance problem Psych:  change in mood or affect.  +depression or +anxiety.   memory loss.  OBJ- Physical Exam General- Alert, Oriented, Affect-appropriate, Distress- none acute, O2 3L pulse Skin- rash-none, lesions- none, excoriation- none Lymphadenopathy- none Head- atraumatic            Eyes- Gross vision intact, PERRLA, conjunctivae and secretions clear            Ears- Hearing, canals-normal            Nose- Clear, no-Septal dev, mucus, polyps, erosion, perforation             Throat- Mallampati II , mucosa clear , drainage- none, tonsils-  atrophic Neck- flexible , trachea midline, no stridor , thyroid nl, carotid no bruit Chest - symmetrical excursion , unlabored           Heart/CV- RRR , no murmur , no gallop  , no rub, nl s1 s2                           - JVD- none , edema- none, stasis changes- none, varices- none           Lung- clear to P&A, wheeze- none, cough- none , dullness-none, rub- none           Chest wall-  Abd-  Br/ Gen/ Rectal- Not done, not indicated Extrem- cyanosis- none, clubbing, none, atrophy- none, strength- nl. + wheelchair Neuro- grossly intact to observation

## 2022-07-01 MED ORDER — SPIRIVA RESPIMAT 1.25 MCG/ACT IN AERS: 2.0000 | INHALATION_SPRAY | Freq: Every day | RESPIRATORY_TRACT | 0 refills | Status: DC

## 2022-07-02 NOTE — Telephone Encounter (Signed)
Order was received. Will have CY sign and fax when completed. Nothing further needed

## 2022-07-10 ENCOUNTER — Telehealth: Payer: Self-pay | Admitting: Internal Medicine

## 2022-07-10 NOTE — Telephone Encounter (Signed)
CT thorax chest from 05/14/2022 CD received via mail from [place]- placed in CD basket upfront.

## 2022-07-11 ENCOUNTER — Encounter: Payer: Self-pay | Admitting: Internal Medicine

## 2022-07-11 ENCOUNTER — Other Ambulatory Visit: Payer: Self-pay | Admitting: Nurse Practitioner

## 2022-07-11 DIAGNOSIS — J449 Chronic obstructive pulmonary disease, unspecified: Secondary | ICD-10-CM

## 2022-07-11 DIAGNOSIS — Z760 Encounter for issue of repeat prescription: Secondary | ICD-10-CM

## 2022-07-11 MED ORDER — ALBUTEROL SULFATE HFA 108 (90 BASE) MCG/ACT IN AERS: INHALATION_SPRAY | RESPIRATORY_TRACT | 12 refills | Status: DC

## 2022-07-11 NOTE — Telephone Encounter (Signed)
Routing to [place] [place] for f/u

## 2022-07-12 ENCOUNTER — Other Ambulatory Visit: Payer: Self-pay | Admitting: Primary Care

## 2022-07-12 DIAGNOSIS — E785 Hyperlipidemia, unspecified: Secondary | ICD-10-CM

## 2022-07-14 ENCOUNTER — Other Ambulatory Visit: Payer: Self-pay | Admitting: Nurse Practitioner

## 2022-07-14 DIAGNOSIS — Z760 Encounter for issue of repeat prescription: Secondary | ICD-10-CM

## 2022-07-14 MED ORDER — MEMANTINE HCL 5 MG PO TABS
5.0000 mg | ORAL_TABLET | Freq: Two times a day (BID) | ORAL | 3 refills | Status: DC
Start: 2022-07-14 — End: 2023-02-23

## 2022-07-17 ENCOUNTER — Telehealth: Payer: Medicare PPO | Admitting: Family Medicine

## 2022-07-17 DIAGNOSIS — N3000 Acute cystitis without hematuria: Secondary | ICD-10-CM | POA: Diagnosis not present

## 2022-07-18 MED ORDER — CEPHALEXIN 500 MG PO CAPS: 500.0000 mg | ORAL_CAPSULE | Freq: Two times a day (BID) | ORAL | 0 refills | Status: AC

## 2022-07-18 NOTE — Progress Notes (Signed)
E-Visit for Urinary Problems  We are sorry that you are not feeling well.  Here is how we plan to help!  Based on what you shared with me it looks like you most likely have a simple urinary tract infection.  A UTI (Urinary Tract Infection) is a bacterial infection of the bladder.  Most cases of urinary tract infections are simple to treat but a key part of your care is to encourage you to drink plenty of fluids and watch your symptoms carefully.  I have prescribed Keflex 500 mg twice a day for 7 days.  Your symptoms should gradually improve. Call us if the burning in your urine worsens, you develop worsening fever, back pain or pelvic pain or if your symptoms do not resolve after completing the antibiotic.  Urinary tract infections can be prevented by drinking plenty of water to keep your body hydrated.  Also be sure when you wipe, wipe from front to back and don't hold it in!  If possible, empty your bladder every 4 hours.  HOME CARE Drink plenty of fluids Compete the full course of the antibiotics even if the symptoms resolve Remember, when you need to go.go. Holding in your urine can increase the likelihood of getting a UTI! GET HELP RIGHT AWAY IF: You cannot urinate You get a high fever Worsening back pain occurs You see blood in your urine You feel sick to your stomach or throw up You feel like you are going to pass out  MAKE SURE YOU  Understand these instructions. Will watch your condition. Will get help right away if you are not doing well or get worse.   Thank you for choosing an e-visit.  Your e-visit answers were reviewed by a board certified advanced clinical practitioner to complete your personal care plan. Depending upon the condition, your plan could have included both over the counter or prescription medications.  Please review your pharmacy choice. Make sure the pharmacy is open so you can pick up prescription now. If there is a problem, you may contact your  provider through [employer] and have the prescription routed to another pharmacy.  Your safety is important to [place]. If you have drug allergies check your prescription carefully.   For the next 24 hours you can use MyChart to ask questions about today's visit, request a non-urgent call back, or ask for a work or school excuse. You will get an email in the next two days asking about your experience. I hope that your e-visit has been valuable and will speed your recovery.    have provided 5 minutes of non face to face time during this encounter for chart review and documentation.

## 2022-07-21 ENCOUNTER — Telehealth: Payer: Self-pay | Admitting: Primary Care

## 2022-07-21 NOTE — Telephone Encounter (Signed)
Home Health verbal orders Caller Name: Donald Name: [place]il[place] Pillar number: [phone]  Requesting OT  Frequency: 1 time a week for 6 weeks  Please forward to [facility] pool or providers CMA

## 2022-07-21 NOTE — Telephone Encounter (Signed)
Approved.  

## 2022-07-22 NOTE — Telephone Encounter (Signed)
Called spoke to [place] gave verbal. Will call if any questions.

## 2022-07-23 DIAGNOSIS — N390 Urinary tract infection, site not specified: Secondary | ICD-10-CM

## 2022-07-24 ENCOUNTER — Inpatient Hospital Stay
Admission: EM | Admit: 2022-07-24 | Discharge: 2022-08-02 | DRG: 091 | Disposition: A | Discharge status: Skilled Nursing Facility | Payer: Medicare PPO | Attending: Internal Medicine | Admitting: Internal Medicine

## 2022-07-24 ENCOUNTER — Emergency Department: Payer: Medicare PPO

## 2022-07-24 ENCOUNTER — Other Ambulatory Visit: Payer: Self-pay

## 2022-07-24 DIAGNOSIS — R32 Unspecified urinary incontinence: Secondary | ICD-10-CM

## 2022-07-24 DIAGNOSIS — F0394 Unspecified dementia, unspecified severity, with anxiety: Secondary | ICD-10-CM | POA: Diagnosis present

## 2022-07-24 DIAGNOSIS — I6381 Other cerebral infarction due to occlusion or stenosis of small artery: Secondary | ICD-10-CM | POA: Diagnosis not present

## 2022-07-24 DIAGNOSIS — M6281 Muscle weakness (generalized): Secondary | ICD-10-CM | POA: Diagnosis not present

## 2022-07-24 DIAGNOSIS — R270 Ataxia, unspecified: Secondary | ICD-10-CM | POA: Diagnosis present

## 2022-07-24 DIAGNOSIS — Z20822 Contact with and (suspected) exposure to covid-19: Secondary | ICD-10-CM | POA: Diagnosis present

## 2022-07-24 DIAGNOSIS — R339 Retention of urine, unspecified: Secondary | ICD-10-CM | POA: Diagnosis not present

## 2022-07-24 DIAGNOSIS — Y752 Prosthetic and other implants, materials and neurological devices associated with adverse incidents: Secondary | ICD-10-CM | POA: Diagnosis present

## 2022-07-24 DIAGNOSIS — R1312 Dysphagia, oropharyngeal phase: Secondary | ICD-10-CM | POA: Diagnosis not present

## 2022-07-24 DIAGNOSIS — E876 Hypokalemia: Secondary | ICD-10-CM | POA: Diagnosis present

## 2022-07-24 DIAGNOSIS — J449 Chronic obstructive pulmonary disease, unspecified: Secondary | ICD-10-CM | POA: Diagnosis present

## 2022-07-24 DIAGNOSIS — T8501XA Breakdown (mechanical) of ventricular intracranial (communicating) shunt, initial encounter: Principal | ICD-10-CM | POA: Diagnosis present

## 2022-07-24 DIAGNOSIS — Z888 Allergy status to other drugs, medicaments and biological substances status: Secondary | ICD-10-CM | POA: Diagnosis not present

## 2022-07-24 DIAGNOSIS — F32A Depression, unspecified: Secondary | ICD-10-CM | POA: Diagnosis present

## 2022-07-24 DIAGNOSIS — F0390 Unspecified dementia without behavioral disturbance: Secondary | ICD-10-CM | POA: Diagnosis present

## 2022-07-24 DIAGNOSIS — R338 Other retention of urine: Secondary | ICD-10-CM

## 2022-07-24 DIAGNOSIS — Z87440 Personal history of urinary (tract) infections: Secondary | ICD-10-CM | POA: Diagnosis not present

## 2022-07-24 DIAGNOSIS — Z8673 Personal history of transient ischemic attack (TIA), and cerebral infarction without residual deficits: Secondary | ICD-10-CM

## 2022-07-24 DIAGNOSIS — Z982 Presence of cerebrospinal fluid drainage device: Secondary | ICD-10-CM

## 2022-07-24 DIAGNOSIS — Z902 Acquired absence of lung [part of]: Secondary | ICD-10-CM

## 2022-07-24 DIAGNOSIS — M19012 Primary osteoarthritis, left shoulder: Secondary | ICD-10-CM | POA: Diagnosis not present

## 2022-07-24 DIAGNOSIS — R2689 Other abnormalities of gait and mobility: Secondary | ICD-10-CM | POA: Diagnosis not present

## 2022-07-24 DIAGNOSIS — E1165 Type 2 diabetes mellitus with hyperglycemia: Secondary | ICD-10-CM | POA: Diagnosis not present

## 2022-07-24 DIAGNOSIS — D649 Anemia, unspecified: Secondary | ICD-10-CM | POA: Diagnosis present

## 2022-07-24 DIAGNOSIS — Z87891 Personal history of nicotine dependence: Secondary | ICD-10-CM | POA: Diagnosis not present

## 2022-07-24 DIAGNOSIS — Z833 Family history of diabetes mellitus: Secondary | ICD-10-CM

## 2022-07-24 DIAGNOSIS — Z66 Do not resuscitate: Secondary | ICD-10-CM | POA: Diagnosis present

## 2022-07-24 DIAGNOSIS — F0393 Unspecified dementia, unspecified severity, with mood disturbance: Secondary | ICD-10-CM | POA: Diagnosis present

## 2022-07-24 DIAGNOSIS — G9341 Metabolic encephalopathy: Secondary | ICD-10-CM | POA: Diagnosis present

## 2022-07-24 DIAGNOSIS — Z9181 History of falling: Secondary | ICD-10-CM | POA: Diagnosis not present

## 2022-07-24 DIAGNOSIS — Z85118 Personal history of other malignant neoplasm of bronchus and lung: Secondary | ICD-10-CM

## 2022-07-24 DIAGNOSIS — G9389 Other specified disorders of brain: Secondary | ICD-10-CM | POA: Diagnosis present

## 2022-07-24 DIAGNOSIS — G912 (Idiopathic) normal pressure hydrocephalus: Secondary | ICD-10-CM | POA: Diagnosis not present

## 2022-07-24 DIAGNOSIS — Z7951 Long term (current) use of inhaled steroids: Secondary | ICD-10-CM | POA: Diagnosis not present

## 2022-07-24 DIAGNOSIS — E785 Hyperlipidemia, unspecified: Secondary | ICD-10-CM | POA: Diagnosis present

## 2022-07-24 DIAGNOSIS — R41841 Cognitive communication deficit: Secondary | ICD-10-CM | POA: Diagnosis not present

## 2022-07-24 DIAGNOSIS — R1311 Dysphagia, oral phase: Secondary | ICD-10-CM | POA: Diagnosis not present

## 2022-07-24 DIAGNOSIS — K8020 Calculus of gallbladder without cholecystitis without obstruction: Secondary | ICD-10-CM | POA: Diagnosis not present

## 2022-07-24 DIAGNOSIS — R262 Difficulty in walking, not elsewhere classified: Secondary | ICD-10-CM | POA: Diagnosis not present

## 2022-07-24 DIAGNOSIS — J439 Emphysema, unspecified: Secondary | ICD-10-CM | POA: Diagnosis not present

## 2022-07-24 DIAGNOSIS — R4182 Altered mental status, unspecified: Secondary | ICD-10-CM | POA: Diagnosis not present

## 2022-07-24 DIAGNOSIS — J9611 Chronic respiratory failure with hypoxia: Secondary | ICD-10-CM | POA: Diagnosis not present

## 2022-07-24 DIAGNOSIS — F419 Anxiety disorder, unspecified: Secondary | ICD-10-CM | POA: Diagnosis present

## 2022-07-24 DIAGNOSIS — M25522 Pain in left elbow: Secondary | ICD-10-CM | POA: Diagnosis not present

## 2022-07-24 DIAGNOSIS — T8509XA Other mechanical complication of ventricular intracranial (communicating) shunt, initial encounter: Secondary | ICD-10-CM | POA: Diagnosis not present

## 2022-07-24 LAB — CBC WITH DIFFERENTIAL/PLATELET
Abs Immature Granulocytes: 0.02 10*3/uL (ref 0.00–0.07)
Basophils Absolute: 0 10*3/uL (ref 0.0–0.1)
Basophils Relative: 1 %
Eosinophils Absolute: 0.2 10*3/uL (ref 0.0–0.5)
Eosinophils Relative: 2 %
HCT: 33.7 % — ABNORMAL LOW (ref 36.0–46.0)
Hemoglobin: 10.1 g/dL — ABNORMAL LOW (ref 12.0–15.0)
Immature Granulocytes: 0 %
Lymphocytes Relative: 33 %
Lymphs Abs: 2.6 10*3/uL (ref 0.7–4.0)
MCH: 27.6 pg (ref 26.0–34.0)
MCHC: 30 g/dL (ref 30.0–36.0)
MCV: 92.1 fL (ref 80.0–100.0)
Monocytes Absolute: 0.7 10*3/uL (ref 0.1–1.0)
Monocytes Relative: 9 %
Neutro Abs: 4.3 10*3/uL (ref 1.7–7.7)
Neutrophils Relative %: 55 %
Platelets: 293 10*3/uL (ref 150–400)
RBC: 3.66 MIL/uL — ABNORMAL LOW (ref 3.87–5.11)
RDW: 13.5 % (ref 11.5–15.5)
WBC: 7.9 10*3/uL (ref 4.0–10.5)
nRBC: 0 % (ref 0.0–0.2)

## 2022-07-24 LAB — URINALYSIS, ROUTINE W REFLEX MICROSCOPIC
Bacteria, UA: NONE SEEN
Bilirubin Urine: NEGATIVE
Glucose, UA: NEGATIVE mg/dL
Hgb urine dipstick: NEGATIVE
Ketones, ur: NEGATIVE mg/dL
Leukocytes,Ua: NEGATIVE
Nitrite: NEGATIVE
Protein, ur: 30 mg/dL — AB
Specific Gravity, Urine: 1.024 (ref 1.005–1.030)
Squamous Epithelial / HPF: NONE SEEN (ref 0–5)
pH: 5 (ref 5.0–8.0)

## 2022-07-24 LAB — COMPREHENSIVE METABOLIC PANEL
ALT: 15 U/L (ref 0–44)
AST: 16 U/L (ref 15–41)
Albumin: 3.8 g/dL (ref 3.5–5.0)
Alkaline Phosphatase: 58 U/L (ref 38–126)
Anion gap: 8 (ref 5–15)
BUN: 12 mg/dL (ref 8–23)
CO2: 34 mmol/L — ABNORMAL HIGH (ref 22–32)
Calcium: 9.6 mg/dL (ref 8.9–10.3)
Chloride: 101 mmol/L (ref 98–111)
Creatinine, Ser: 0.63 mg/dL (ref 0.44–1.00)
GFR, Estimated: >60 mL/min (ref >60)
Glucose, Bld: 97 mg/dL (ref 70–99)
Potassium: 3.3 mmol/L — ABNORMAL LOW (ref 3.5–5.1)
Sodium: 143 mmol/L (ref 135–145)
Total Bilirubin: 0.5 mg/dL (ref 0.3–1.2)
Total Protein: 7.5 g/dL (ref 6.5–8.1)

## 2022-07-24 LAB — SARS CORONAVIRUS 2 BY RT PCR: SARS Coronavirus 2 by RT PCR: NEGATIVE

## 2022-07-24 NOTE — ED Notes (Signed)
Patient transported to CT 

## 2022-07-24 NOTE — ED Notes (Signed)
Dtr reports pt usually has a clean UA but culture grows something.

## 2022-07-24 NOTE — ED Provider Notes (Signed)
[name] Provider Note    Event Date/Time   First MD Initiated Contact with Patient 07/24/22 2138     (approximate)   History   Altered Mental Status   HPI  Amanda Porter is a 76 y.o. female with history of NPH some mild dementia history of recurrent UTIs, most recent culture growing Pseudomonas presents to the ER for evaluation of progressive worsening altered mental status past several days.  Was diagnosed with UTI and started on Keflex which has been put on the past and has failed treatment with this.  Family feels that this is more consistent with recurrent UTI.  No measured fevers.  No nausea or vomiting.  No cough or congestion.     Physical Exam   Triage Vital Signs: ED Triage Vitals [07/24/22 1810]  Enc Vitals Group     BP 129/69     Pulse Rate 88     Resp 12     Temp 98.1 F (36.7 C)     Temp src      SpO2 99 %     Weight 108 lb (49 kg)     Height      Head Circumference      Peak Flow      Pain Score 0     Pain Loc      Pain Edu?      Excl. in [place]?     Most recent vital signs: Vitals:   07/24/22 2155 07/24/22 2200  BP: (!) 142/75 (!) 142/93  Pulse: 84 79  Resp:    Temp:    SpO2: 100% 100%     Constitutional: Alert  Eyes: Conjunctivae are normal.  Head: Atraumatic. Nose: No congestion/rhinnorhea. Mouth/Throat: Mucous membranes are moist.   Neck: Painless ROM.  Cardiovascular:   Good peripheral circulation. Respiratory: Normal respiratory effort.  No retractions.  Gastrointestinal: Soft and nontender.  Musculoskeletal:  no deformity Neurologic:  MAE spontaneously. No gross focal neurologic deficits are appreciated.  Skin:  Skin is warm, dry and intact. No rash noted. Psychiatric: pleasant, cooperative    ED Results / Procedures / Treatments   Labs (all labs ordered are listed, but only abnormal results are displayed) Labs Reviewed  COMPREHENSIVE METABOLIC PANEL - Abnormal; Notable for the following  components:      Result Value   Potassium 3.3 (*)    CO2 34 (*)    All other components within normal limits  CBC WITH DIFFERENTIAL/PLATELET - Abnormal; Notable for the following components:   RBC 3.66 (*)    Hemoglobin 10.1 (*)    HCT 33.7 (*)    All other components within normal limits  URINALYSIS, ROUTINE W REFLEX MICROSCOPIC - Abnormal; Notable for the following components:   Color, Urine YELLOW (*)    APPearance CLEAR (*)    Protein, ur 30 (*)    All other components within normal limits  SARS CORONAVIRUS 2 BY RT PCR  BLOOD GAS, VENOUS        RADIOLOGY Please see ED Course for my review and interpretation.  I personally reviewed all radiographic images ordered to evaluate for the above acute complaints and reviewed radiology reports and findings.  These findings were personally discussed with the patient.  Please see medical record for radiology report.    PROCEDURES:  Critical Care performed: No  Procedures   MEDICATIONS ORDERED IN ED: Medications - No data to display   IMPRESSION / MDM / [place] / ED  COURSE  I reviewed the triage vital signs and the nursing notes.                              Differential diagnosis includes, but is not limited to, Dehydration, sepsis, pna, uti, hypoglycemia, cva, drug effect, withdrawal, encephalitis  Patient presented to the ER for evaluation of symptoms as described above.  This presenting complaint could reflect a potentially life-threatening illness therefore the patient will be placed on continuous pulse oximetry and telemetry for monitoring.  Laboratory evaluation will be sent to evaluate for the above complaints.  Due to tentacle downtime unable to view images at this time I had ordered CT as well as chest x-ray but unable to review them personally.  Patient will to be signed out to oncoming physician pending follow-up imaging and reassessment.        FINAL CLINICAL IMPRESSION(S) / ED DIAGNOSES    Final diagnoses:  Altered mental status, unspecified altered mental status type     Rx / DC Orders   ED Discharge Orders     None        Note:  This document was prepared using Dragon voice recognition software and may include unintentional dictation errors.    Merlyn Lot, MD 07/24/22 2350

## 2022-07-24 NOTE — ED Triage Notes (Signed)
PT coming from home POV for AMS r/t chronic UTI. Pt dx with uti 1 week ago and has been on keflex. Pt and daughter states that patient has been increasingly confused.  Pt alert and oriented to self only. Pt denies any urinary symptoms. Pt daughter stating increased confusion started 3 days ago. Pt daughter stating increased urination and frequency started 2 days ago.   PMH dementia, alert and oriented to everything but date.

## 2022-07-25 ENCOUNTER — Inpatient Hospital Stay: Payer: Medicare PPO | Admitting: Radiology

## 2022-07-25 ENCOUNTER — Emergency Department: Payer: Medicare PPO

## 2022-07-25 DIAGNOSIS — R270 Ataxia, unspecified: Secondary | ICD-10-CM

## 2022-07-25 DIAGNOSIS — F0390 Unspecified dementia without behavioral disturbance: Secondary | ICD-10-CM

## 2022-07-25 DIAGNOSIS — G912 (Idiopathic) normal pressure hydrocephalus: Secondary | ICD-10-CM | POA: Diagnosis present

## 2022-07-25 DIAGNOSIS — E785 Hyperlipidemia, unspecified: Secondary | ICD-10-CM | POA: Diagnosis present

## 2022-07-25 DIAGNOSIS — F419 Anxiety disorder, unspecified: Secondary | ICD-10-CM

## 2022-07-25 DIAGNOSIS — F0393 Unspecified dementia, unspecified severity, with mood disturbance: Secondary | ICD-10-CM | POA: Diagnosis present

## 2022-07-25 DIAGNOSIS — J449 Chronic obstructive pulmonary disease, unspecified: Secondary | ICD-10-CM | POA: Diagnosis present

## 2022-07-25 DIAGNOSIS — G9389 Other specified disorders of brain: Secondary | ICD-10-CM | POA: Diagnosis present

## 2022-07-25 DIAGNOSIS — Z9181 History of falling: Secondary | ICD-10-CM

## 2022-07-25 DIAGNOSIS — Z87440 Personal history of urinary (tract) infections: Secondary | ICD-10-CM | POA: Diagnosis not present

## 2022-07-25 DIAGNOSIS — Y752 Prosthetic and other implants, materials and neurological devices associated with adverse incidents: Secondary | ICD-10-CM | POA: Diagnosis present

## 2022-07-25 DIAGNOSIS — D649 Anemia, unspecified: Secondary | ICD-10-CM | POA: Diagnosis present

## 2022-07-25 DIAGNOSIS — Z85118 Personal history of other malignant neoplasm of bronchus and lung: Secondary | ICD-10-CM | POA: Diagnosis not present

## 2022-07-25 DIAGNOSIS — Z87891 Personal history of nicotine dependence: Secondary | ICD-10-CM | POA: Diagnosis not present

## 2022-07-25 DIAGNOSIS — F32A Depression, unspecified: Secondary | ICD-10-CM

## 2022-07-25 DIAGNOSIS — G9341 Metabolic encephalopathy: Secondary | ICD-10-CM

## 2022-07-25 DIAGNOSIS — Z7951 Long term (current) use of inhaled steroids: Secondary | ICD-10-CM

## 2022-07-25 DIAGNOSIS — F0394 Unspecified dementia, unspecified severity, with anxiety: Secondary | ICD-10-CM | POA: Diagnosis present

## 2022-07-25 DIAGNOSIS — E876 Hypokalemia: Secondary | ICD-10-CM

## 2022-07-25 DIAGNOSIS — Z982 Presence of cerebrospinal fluid drainage device: Secondary | ICD-10-CM | POA: Diagnosis not present

## 2022-07-25 DIAGNOSIS — Z833 Family history of diabetes mellitus: Secondary | ICD-10-CM | POA: Diagnosis not present

## 2022-07-25 DIAGNOSIS — Z888 Allergy status to other drugs, medicaments and biological substances status: Secondary | ICD-10-CM | POA: Diagnosis not present

## 2022-07-25 DIAGNOSIS — R4182 Altered mental status, unspecified: Secondary | ICD-10-CM

## 2022-07-25 DIAGNOSIS — Z902 Acquired absence of lung [part of]: Secondary | ICD-10-CM | POA: Diagnosis not present

## 2022-07-25 DIAGNOSIS — J9611 Chronic respiratory failure with hypoxia: Secondary | ICD-10-CM | POA: Diagnosis not present

## 2022-07-25 DIAGNOSIS — Z20822 Contact with and (suspected) exposure to covid-19: Secondary | ICD-10-CM | POA: Diagnosis present

## 2022-07-25 DIAGNOSIS — E1165 Type 2 diabetes mellitus with hyperglycemia: Secondary | ICD-10-CM | POA: Diagnosis not present

## 2022-07-25 DIAGNOSIS — T8501XA Breakdown (mechanical) of ventricular intracranial (communicating) shunt, initial encounter: Secondary | ICD-10-CM | POA: Diagnosis present

## 2022-07-25 DIAGNOSIS — Z66 Do not resuscitate: Secondary | ICD-10-CM | POA: Diagnosis present

## 2022-07-25 DIAGNOSIS — Z8673 Personal history of transient ischemic attack (TIA), and cerebral infarction without residual deficits: Secondary | ICD-10-CM | POA: Diagnosis not present

## 2022-07-25 DIAGNOSIS — R339 Retention of urine, unspecified: Secondary | ICD-10-CM | POA: Diagnosis not present

## 2022-07-25 HISTORY — PX: IR FL GUIDED LOC OF NEEDLE/CATH TIP FOR SPINAL INJECTION RT: IMG2397

## 2022-07-25 LAB — CBC
HCT: 31.8 % — ABNORMAL LOW (ref 36.0–46.0)
Hemoglobin: 9.8 g/dL — ABNORMAL LOW (ref 12.0–15.0)
MCH: 27.5 pg (ref 26.0–34.0)
MCHC: 30.8 g/dL (ref 30.0–36.0)
MCV: 89.3 fL (ref 80.0–100.0)
Platelets: 284 10*3/uL (ref 150–400)
RBC: 3.56 MIL/uL — ABNORMAL LOW (ref 3.87–5.11)
RDW: 13.6 % (ref 11.5–15.5)
WBC: 5.9 10*3/uL (ref 4.0–10.5)
nRBC: 0 % (ref 0.0–0.2)

## 2022-07-25 LAB — CSF CELL COUNT WITH DIFFERENTIAL
Eosinophils, CSF: 0 %
Lymphs, CSF: 0 %
Monocyte-Macrophage-Spinal Fluid: 0 %
RBC Count, CSF: 2 /mm3 (ref 0–3)
Segmented Neutrophils-CSF: 0 %
Tube #: 3
WBC, CSF: 0 /mm3 (ref 0–5)

## 2022-07-25 LAB — BASIC METABOLIC PANEL
Anion gap: 7 (ref 5–15)
BUN: 10 mg/dL (ref 8–23)
CO2: 31 mmol/L (ref 22–32)
Calcium: 8.9 mg/dL (ref 8.9–10.3)
Chloride: 105 mmol/L (ref 98–111)
Creatinine, Ser: 0.61 mg/dL (ref 0.44–1.00)
GFR, Estimated: >60 mL/min (ref >60)
Glucose, Bld: 147 mg/dL — ABNORMAL HIGH (ref 70–99)
Potassium: 4.2 mmol/L (ref 3.5–5.1)
Sodium: 143 mmol/L (ref 135–145)

## 2022-07-25 LAB — MAGNESIUM: Magnesium: 2 mg/dL (ref 1.7–2.4)

## 2022-07-25 LAB — PROTEIN, CSF: Total  Protein, CSF: 31 mg/dL (ref 15–45)

## 2022-07-25 LAB — BLOOD GAS, VENOUS
Acid-Base Excess: 10.6 mmol/L — ABNORMAL HIGH (ref 0.0–2.0)
Bicarbonate: 35.7 mmol/L — ABNORMAL HIGH (ref 20.0–28.0)
O2 Saturation: 94.9 %
Patient temperature: 37
pCO2, Ven: 48 mmHg (ref 44–60)
pH, Ven: 7.48 — ABNORMAL HIGH (ref 7.25–7.43)
pO2, Ven: 66 mmHg — ABNORMAL HIGH (ref 32–45)

## 2022-07-25 LAB — CBG MONITORING, ED: Glucose-Capillary: 135 mg/dL — ABNORMAL HIGH (ref 70–99)

## 2022-07-25 LAB — GLUCOSE, CSF: Glucose, CSF: 77 mg/dL — ABNORMAL HIGH (ref 40–70)

## 2022-07-25 MED ORDER — GLIPIZIDE ER 2.5 MG PO TB24
2.5000 mg | ORAL_TABLET | Freq: Every day | ORAL | Status: DC
  Administered 2022-07-25: 2.5 mg via ORAL
  Filled 2022-07-25: qty 1

## 2022-07-25 MED ORDER — KETOROLAC TROMETHAMINE 30 MG/ML IJ SOLN: 15.0000 mg | Freq: Once | INTRAMUSCULAR | Status: DC

## 2022-07-25 MED ORDER — GUAIFENESIN ER 600 MG PO TB12: 600.0000 mg | ORAL_TABLET | ORAL | Status: DC | PRN

## 2022-07-25 MED ORDER — BUPROPION HCL ER (XL) 150 MG PO TB24
300.0000 mg | ORAL_TABLET | Freq: Every day | ORAL | Status: DC
  Administered 2022-07-25 – 2022-08-02 (×9): 300 mg via ORAL
  Filled 2022-07-25 (×9): qty 2

## 2022-07-25 MED ORDER — ALBUTEROL SULFATE (2.5 MG/3ML) 0.083% IN NEBU: 2.5000 mg | INHALATION_SOLUTION | RESPIRATORY_TRACT | Status: DC | PRN

## 2022-07-25 MED ORDER — BUSPIRONE HCL 5 MG PO TABS: 30.0000 mg | ORAL_TABLET | Freq: Two times a day (BID) | ORAL | Status: DC

## 2022-07-25 MED ORDER — MAGNESIUM HYDROXIDE 400 MG/5ML PO SUSP: 30.0000 mL | Freq: Every day | ORAL | Status: DC | PRN

## 2022-07-25 MED ORDER — BUSPIRONE HCL 10 MG PO TABS
30.0000 mg | ORAL_TABLET | Freq: Every day | ORAL | Status: DC
  Administered 2022-07-26 – 2022-08-01 (×8): 30 mg via ORAL
  Filled 2022-07-25: qty 3
  Filled 2022-07-25: qty 6
  Filled 2022-07-25 (×6): qty 3

## 2022-07-25 MED ORDER — ONDANSETRON HCL 4 MG/2ML IJ SOLN: 4.0000 mg | Freq: Four times a day (QID) | INTRAMUSCULAR | Status: DC | PRN

## 2022-07-25 MED ORDER — CYANOCOBALAMIN 500 MCG PO TABS
ORAL_TABLET | Freq: Every day | ORAL | Status: DC
  Administered 2022-07-25 – 2022-08-02 (×8): 500 ug via ORAL
  Filled 2022-07-25 (×9): qty 1

## 2022-07-25 MED ORDER — POTASSIUM CHLORIDE 20 MEQ PO PACK
40.0000 meq | PACK | Freq: Once | ORAL | Status: AC
  Administered 2022-07-25: 40 meq via ORAL
  Filled 2022-07-25: qty 2

## 2022-07-25 MED ORDER — UMECLIDINIUM BROMIDE 62.5 MCG/ACT IN AEPB
1.0000 | INHALATION_SPRAY | Freq: Every day | RESPIRATORY_TRACT | Status: DC
  Administered 2022-07-26 – 2022-08-02 (×8): 1 via RESPIRATORY_TRACT
  Filled 2022-07-25: qty 7

## 2022-07-25 MED ORDER — ONDANSETRON HCL 4 MG PO TABS: 4.0000 mg | ORAL_TABLET | Freq: Four times a day (QID) | ORAL | Status: DC | PRN

## 2022-07-25 MED ORDER — POTASSIUM CHLORIDE IN NACL 20-0.9 MEQ/L-% IV SOLN
INTRAVENOUS | Status: DC
  Filled 2022-07-25: qty 1000

## 2022-07-25 MED ORDER — LIDOCAINE HCL (PF) 1 % IJ SOLN
5.0000 mL | Freq: Once | INTRAMUSCULAR | Status: AC
  Administered 2022-07-25: 5 mL

## 2022-07-25 MED ORDER — CLONAZEPAM 0.5 MG PO TABS
1.0000 mg | ORAL_TABLET | Freq: Every day | ORAL | Status: DC
  Administered 2022-07-26 – 2022-08-01 (×8): 1 mg via ORAL
  Filled 2022-07-25 (×8): qty 2

## 2022-07-25 MED ORDER — FOLIC ACID 1 MG PO TABS
1.0000 mg | ORAL_TABLET | Freq: Every day | ORAL | Status: DC
  Administered 2022-07-25 – 2022-08-02 (×9): 1 mg via ORAL
  Filled 2022-07-25 (×9): qty 1

## 2022-07-25 MED ORDER — BUSPIRONE HCL 10 MG PO TABS
45.0000 mg | ORAL_TABLET | Freq: Every day | ORAL | Status: DC
  Administered 2022-07-25 – 2022-08-02 (×9): 45 mg via ORAL
  Filled 2022-07-25 (×7): qty 5
  Filled 2022-07-25: qty 9
  Filled 2022-07-25 (×2): qty 5

## 2022-07-25 MED ORDER — TRAZODONE HCL 50 MG PO TABS
25.0000 mg | ORAL_TABLET | Freq: Every evening | ORAL | Status: DC | PRN
  Administered 2022-07-27 – 2022-07-28 (×2): 25 mg via ORAL
  Filled 2022-07-25 (×2): qty 1

## 2022-07-25 MED ORDER — TAMSULOSIN HCL 0.4 MG PO CAPS
0.4000 mg | ORAL_CAPSULE | Freq: Every day | ORAL | Status: DC
  Administered 2022-07-25 – 2022-08-01 (×9): 0.4 mg via ORAL
  Filled 2022-07-25 (×9): qty 1

## 2022-07-25 MED ORDER — ROSUVASTATIN CALCIUM 20 MG PO TABS
20.0000 mg | ORAL_TABLET | Freq: Every evening | ORAL | Status: DC
  Administered 2022-07-26 – 2022-08-01 (×7): 20 mg via ORAL
  Filled 2022-07-25 (×8): qty 1

## 2022-07-25 MED ORDER — INSULIN ASPART 100 UNIT/ML IJ SOLN
0.0000 [IU] | Freq: Three times a day (TID) | INTRAMUSCULAR | Status: DC
  Administered 2022-07-26: 2 [IU] via SUBCUTANEOUS
  Administered 2022-07-26 – 2022-07-27 (×3): 1 [IU] via SUBCUTANEOUS
  Administered 2022-07-28: 2 [IU] via SUBCUTANEOUS
  Administered 2022-07-30 – 2022-08-01 (×3): 1 [IU] via SUBCUTANEOUS
  Filled 2022-07-25 (×7): qty 1

## 2022-07-25 MED ORDER — ENOXAPARIN SODIUM 40 MG/0.4ML IJ SOSY
40.0000 mg | PREFILLED_SYRINGE | INTRAMUSCULAR | Status: DC
  Administered 2022-07-25 – 2022-08-02 (×9): 40 mg via SUBCUTANEOUS
  Filled 2022-07-25 (×9): qty 0.4

## 2022-07-25 MED ORDER — ACETAMINOPHEN 325 MG PO TABS
650.0000 mg | ORAL_TABLET | Freq: Four times a day (QID) | ORAL | Status: DC | PRN
  Administered 2022-07-26 – 2022-08-01 (×3): 650 mg via ORAL
  Filled 2022-07-25 (×3): qty 2

## 2022-07-25 MED ORDER — ACETAMINOPHEN 650 MG RE SUPP: 650.0000 mg | Freq: Four times a day (QID) | RECTAL | Status: DC | PRN

## 2022-07-25 MED ORDER — ACETAMINOPHEN 500 MG PO TABS
1000.0000 mg | ORAL_TABLET | Freq: Once | ORAL | Status: AC
Start: 2022-07-25 — End: 2022-07-25
  Administered 2022-07-25: 1000 mg via ORAL
  Filled 2022-07-25: qty 2

## 2022-07-25 MED ORDER — MEMANTINE HCL 5 MG PO TABS
5.0000 mg | ORAL_TABLET | Freq: Two times a day (BID) | ORAL | Status: DC
  Administered 2022-07-25 – 2022-08-02 (×17): 5 mg via ORAL
  Filled 2022-07-25 (×17): qty 1

## 2022-07-25 MED ORDER — ALBUTEROL SULFATE HFA 108 (90 BASE) MCG/ACT IN AERS: 2.0000 | INHALATION_SPRAY | RESPIRATORY_TRACT | Status: DC | PRN

## 2022-07-25 MED ORDER — ARFORMOTEROL TARTRATE 15 MCG/2ML IN NEBU
15.0000 ug | INHALATION_SOLUTION | Freq: Two times a day (BID) | RESPIRATORY_TRACT | Status: DC
  Administered 2022-07-26 – 2022-08-02 (×15): 15 ug via RESPIRATORY_TRACT
  Filled 2022-07-25 (×16): qty 2

## 2022-07-25 NOTE — ED Notes (Signed)
Pt assisted to toilet with walker

## 2022-07-25 NOTE — ED Notes (Addendum)
Pt fed applesauce and coke per her request. Dtr has left due to having to work at 0600. She is desiring call with updates. Lower dentures at bedside in labeled cup. Uppers in her mouth. Pt cannot eat without them. Glasses also at bedside. Headache has improved with tylenol

## 2022-07-25 NOTE — ED Notes (Signed)
Patient is resting comfortably. 

## 2022-07-25 NOTE — ED Notes (Signed)
Ambulated with assist to toilet to urinate.  Says she is hungry.

## 2022-07-25 NOTE — Assessment & Plan Note (Signed)
-   We will continue Namenda and place her on as needed IM Haldol for agitation.

## 2022-07-25 NOTE — Consult Note (Addendum)
Referring Physician:  No referring provider defined for this encounter.  Primary Physician:  Pleas Koch, NP  History of Present Illness: 07/25/2022 Amanda Porter is here today with a chief complaint of altered mental status, agitation, and urinary incontinence in the past few days.  She has had worsening unsteady gait with at least one fall.  She lives at baseline with her sister.  She denies HA, N, V.    Review of Systems:  A 10 point review of systems is negative, except for the pertinent positives and negatives detailed in the HPI.  Past Medical History: Past Medical History:  Diagnosis Date   Acute respiratory failure requiring reintubation ([place]) 11/05/2021   Anxiety    Anxiety and depression    COPD (chronic obstructive pulmonary disease) (H[facility])    CVA (cerebral vascular accident) ([place])    CVA (cerebral vascular accident) ([place])    L sided deficits   Dementia ([place])    Depression    Diabetes mellitus ([place])    Type 2   Diabetes mellitus without complication ([place])    Dysphagia    Dysphasia    History of lung cancer    Hyperlipidemia    Influenza A 12/13/2021   Memory disturbance 11/20/2017   Normal pressure hydrocephalus ([place]) 2018   Retinal detachment    Right   Right carotid bruit 11/20/2017   Tardive dyskinesia 02/01/2020   Tardive dyskinesia    Thrombosis    Arterial to lower extremity?    Past Surgical History: Past Surgical History:  Procedure Laterality Date   LUNG REMOVAL, PARTIAL     left upper lobe   VENTRICULOPERITONEAL SHUNT  12/09/2016   VENTRICULOPERITONEAL SHUNT      Allergies: Allergies as of 07/24/2022 - Review Complete 07/24/2022  Allergen Reaction Noted   Lexapro [escitalopram oxalate] Other (See Comments) 09/23/2017   Neosporin  [neomycin-polymyxin-gramicidin] Rash    Neosporin [bacitracin-polymyxin b] Rash 11/04/2021    Medications: Current Meds  Medication Sig   Accu-Chek Softclix Lancets lancets Use as instructed    buPROPion (WELLBUTRIN XL) 150 MG 24 hr tablet 1 qam for 5 days then 2  qam (Patient taking differently: Take 300 mg by mouth daily with breakfast.)   busPIRone (BUSPAR) 15 MG tablet 3  qam   2 qpm (Patient taking differently: Take 30-45 mg by mouth See admin instructions. Takes 45 mg (3 tabs) in AM and 30mg  (2 tabs) at bedtime)   cephALEXin (KEFLEX) 500 MG capsule Take 1 capsule (500 mg total) by mouth 2 (two) times daily for 7 days.   clonazePAM (KLONOPIN) 1 MG tablet 1 qhs (Patient taking differently: Take 1 mg by mouth at bedtime.)   Cyanocobalamin (B-12 PO) Take 1 capsule by mouth daily.   folic acid (FOLVITE) 1 MG tablet Take 1 tablet (1 mg total) by mouth daily.   glipiZIDE (GLUCOTROL XL) 2.5 MG 24 hr tablet TAKE 1 TABLET (2.5 MG TOTAL) BY MOUTH DAILY WITH BREAKFAST. FOR DIABETES.   memantine (NAMENDA) 5 MG tablet Take 1 tablet (5 mg total) by mouth 2 (two) times daily. For memory.   OXYGEN 3 L continuous.    rosuvastatin (CRESTOR) 20 MG tablet TAKE 1 TABLET (20 MG TOTAL) BY MOUTH EVERY EVENING. FOR CHOLESTEROL.   tamsulosin (FLOMAX) 0.4 MG CAPS capsule Take 1 capsule (0.4 mg total) by mouth daily. For urine retention   Tiotropium Bromide-Olodaterol (STIOLTO RESPIMAT) 2.5-2.5 MCG/ACT AERS INHALE 2 PUFFS BY MOUTH INTO THE LUNGS DAILY (Patient taking differently:  Inhale 2 puffs into the lungs daily.)    Social History: Social History   Tobacco Use   Smoking status: Former    Packs/day: 0.50    Years: 50.00    Total pack years: 25.00    Types: Cigarettes    Quit date: 08/01/2017    Years since quitting: 4.9   Smokeless tobacco: Never  Vaping Use   Vaping Use: Never used  Substance Use Topics   Alcohol use: Not Currently   Drug use: Not Currently    Family Medical History: Family History  Problem Relation Age of Onset   Pneumonia Mother    Cancer Father    Diabetes Sister    Diabetes Sister    Breast cancer Neg Hx     Physical Examination: Vitals:   07/25/22 1030 07/25/22  1156  BP: (!) 132/59 (!) 173/68  Pulse: 70 90  Resp: 20 20  Temp:  98.4 F (36.9 C)  SpO2: 100% 99%    General: Patient is well developed, well nourished, calm, collected, and in no apparent distress. Attention to examination is appropriate.  Neck:   Supple.  Full range of motion.  Respiratory: Patient is breathing without any difficulty.   NEUROLOGICAL:     Awake, alert, oriented to person, "[facility]," and "May 1984." Speech is clear and fluent. Fund of knowledge is sparse.   Cranial Nerves: Pupils equal round and reactive to light.  Facial tone is symmetric.  Facial sensation is symmetric. Shoulder shrug is symmetric. Tongue protrusion is midline.  There is no pronator drift. She does have some pain in her LUE from her fall.     Strength: 5/5 BLE , 5/5 RUE, 4+/5 LUE due to tenderness and pain from fall.  Reflexes are 2+ and symmetric at the biceps, triceps, brachioradialis, patella and achilles.   Hoffman's is absent.  Clonus is not present.  Toes are down-going.  Bilateral upper and lower extremity sensation is intact to light touch.    No evidence of dysmetria noted.  Gait is untested  Medical Decision Making  Imaging: CT Head 07/25/22   IMPRESSION: 1. Progressive ventriculomegaly when compared to prior examination of 04/17/2022. Stable right frontal ventriculostomy catheter. 2. Stable advanced senescent changes and remote right thalamic infarct. 3. No acute intracranial hemorrhage or infarct.     Electronically Signed   By: Fidela Salisbury M.D.   On: 07/25/2022 01:57    I have personally reviewed the images and agree with the above interpretation.  Assessment and Plan: Amanda Porter is a pleasant 76 y.o. female with normal pressure hydrocephalus with progressive ventriculomegaly suggesting possible shunt malfunction.  I spoke at length to her daughter, who reports that the patient is normally conversant and fully oriented.  We discussed that she has  not had her shunt reprogrammed since March.  Thus, the increase in ventricular size is due to some change in her shunt performance.  I have recommended that we obtain PT evaluation for baseline, then do a high-volume lumbar puncture, then repeat her physical therapy evaluation.  If she shows significant improvement, we will discuss shunt revision.  I am concerned that her current cognitive performance suggests significant dementia.  Dementia is typically irreversible and normal pressure hydrocephalus, which would impact whether I would recommend she consider shunt revision.   Thank you for involving me in the care of this patient.      Marybella Ethier K. [name] MD, [facility] Neurosurgery

## 2022-07-25 NOTE — Assessment & Plan Note (Signed)
-   The patient is presenting with acute metabolic encephalopathy with associated ataxia and urinary incontinence concerning about her worsening hydrocephalus and ventriculomegaly - She will be admitted to a medical telemetry bed. - Neurosurgery consultation will be obtained. - Dr. [name] was notified about the patient and is aware.

## 2022-07-25 NOTE — Progress Notes (Signed)
Physical Therapy Treatment Patient Details Name: Amanda Porter MRN: [id number] DOB: January 17, 1946 Today's Date: 07/25/2022   History of Present Illness Pt is a 76 yo female that presented to the ED for chief comlplaint of AMS, agitation, urinary incontinence, 1 fall, unsteady gait. Per neurosurgery during admission plan is to perform high-volume lumbar punture. PMH includes CVA, COPD, s/p VP shunt, dementia, TD, and DM.    PT Comments    Patient seen s/p procedure to assess for potential differences in functional mobility/cognition. Pt without major change in orientation or safety awareness. Still oriented to self, "hospital" in "[place]" and that the date was August 24th, 2014. Still with limited awareness in safety (tends to sit without hand support, RW positioning, "plops"). Pt transferred with supervision and ambulated ~26ft with RW and CGA-supervision, occasional minA to address gait path deviations, though pt noted to be able to better navigate obstacles/maintain gait path. Overall Tinetti Balance Assessment Tool score improved by 2 points, MD and RN notified. The patient would benefit from further skilled PT intervention to continue to progress towards goals. Recommendation remains appropriate.    TINETTI BALANCE ASSESSMENT TOOL BALANCE SECTION Sitting Balance:     1/1  Rises from Chair:   1/2   Attempts to Rise:   2/2  Standing Balance (1st 5 seconds) 1/2  Standing balance:   1/2 Nudged:    2/2 Eyes Closed:    1/1 Turning 360 degrees  0/1      1/1 Sitting down (getting seated):    0/2  Balance Score:    10/16  GAIT SECTION Indication of Gait:   1/1 Step Length and Height  1/1                                                       1/1 Foot Clearance:   1/1                                                       1/1 Step Symmetry:   1/1 Step Continuity:   1/1 Path:     1/2 Trunk:     2/2 Walking Time:   1/1 Gait Score:    11/12  Combined Score:   21/28    Recommendations for  follow up therapy are one component of a multi-disciplinary discharge planning process, led by the attending physician.  Recommendations may be updated based on patient status, additional functional criteria and insurance authorization.  Follow Up Recommendations  Home health PT     Assistance Recommended at Discharge Intermittent Supervision/Assistance  Patient can return home with the following A little help with bathing/dressing/bathroom;Assistance with cooking/housework;Assist for transportation;Help with stairs or ramp for entrance;Direct supervision/assist for medications management   Equipment Recommendations  None recommended by PT    Recommendations for Other Services       Precautions / Restrictions Precautions Precautions: Fall Restrictions Weight Bearing Restrictions: No     Mobility  Bed Mobility Overal bed mobility: Modified Independent                  Transfers Overall transfer level: Needs assistance Equipment used: Rolling walker (2 wheels) Transfers: Sit to/from Stand  Sit to Stand: Supervision                Ambulation/Gait   Gait Distance (Feet): 65 Feet Assistive device: Rolling walker (2 wheels)         General Gait Details: step through gait with reciprocal gait pattern, but did tend to have gait path deviations/drives RW to the L though better with second session and improved ability to navigate obstacles   Stairs             Wheelchair Mobility    Modified Rankin (Stroke Patients Only)       Balance Overall balance assessment: Needs assistance Sitting-balance support: Feet supported Sitting balance-Leahy Scale: Good       Standing balance-Leahy Scale: Fair Standing balance comment: able to statically stand                            Cognition Arousal/Alertness: Awake/alert Behavior During Therapy: WFL for tasks assessed/performed Overall Cognitive Status: No family/caregiver present to determine  baseline cognitive functioning                                 General Comments: pt oriented to self, "hospital" but says [place], and that the date is August 24th, 2004.        Exercises      General Comments        Pertinent Vitals/Pain Pain Assessment Pain Assessment: 0-10 Pain Score: 8  Pain Location: L shoulder pain Pain Descriptors / Indicators: Aching, Sore Pain Intervention(s): Repositioned, Limited activity within patient's tolerance    Home Living Family/patient expects to be discharged to:: Private residence Living Arrangements: Children (pt claims to live with her sister) Available Help at Discharge: Family;Available PRN/intermittently Type of Home: House Home Access: Level entry       Home Layout: One level Home Equipment: Rollator (4 wheels);Shower seat;Grab bars - tub/shower Additional Comments: per chart pt is alone during the day    Prior Function            PT Goals (current goals can now be found in the care plan section) Acute Rehab PT Goals Patient Stated Goal: to go home PT Goal Formulation: With patient Time For Goal Achievement: 08/08/22 Potential to Achieve Goals: Good Progress towards PT goals: Progressing toward goals    Frequency    Min 2X/week      PT Plan Current plan remains appropriate    Co-evaluation              AM-PAC PT "6 Clicks" Mobility   Outcome Measure  Help needed turning from your back to your side while in a flat bed without using bedrails?: None Help needed moving from lying on your back to sitting on the side of a flat bed without using bedrails?: None Help needed moving to and from a bed to a chair (including a wheelchair)?: None Help needed standing up from a chair using your arms (e.g., wheelchair or bedside chair)?: None Help needed to walk in hospital room?: None Help needed climbing 3-5 steps with a railing? : A Little 6 Click Score: 23    End of Session Equipment Utilized  During Treatment: Gait belt Activity Tolerance: Patient tolerated treatment well Patient left: in bed;with call bell/phone within reach;with bed alarm set Nurse Communication: Mobility status PT Visit Diagnosis: Other abnormalities of gait and mobility (R26.89)  Time: 1610-9604 PT Time Calculation (min) (ACUTE ONLY): 18 min  Charges:  $Therapeutic Activity: 8-22 mins                     Lieutenant Diego PT, DPT 5:19 PM,07/25/22

## 2022-07-25 NOTE — ED Notes (Signed)
Spoke with lab staff who will draw AM labs.

## 2022-07-25 NOTE — H&P (Signed)
[place]   PATIENT NAME: Amanda Porter    MR#:  [id number]  DATE OF BIRTH:  [birth year]  DATE OF ADMISSION:  07/24/2022  PRIMARY CARE PHYSICIAN: Pleas Koch, NP   Patient is coming from: Home  REQUESTING/REFERRING PHYSICIAN: Hinda Kehr, MD  CHIEF COMPLAINT:   Chief Complaint  Patient presents with   Altered Mental Status    HISTORY OF PRESENT ILLNESS:  Amanda Porter is a 76 y.o. African-American female with medical history significant for COPD, CVA, depression, dementia, type 2 diabetes mellitus, dysphagia, dyslipidemia, normal pressure hydrocephalus s/p VP shunt, who presented to the ER with acute onset of altered mental status today as well as agitation over the last 6 days and urinary incontinence over the last 4 to 5 days with unsteady gait likely.  She fell about a week ago and was having left elbow pain.  No reported fever or chills.  No nausea or vomiting or abdominal pain.  She has been having urinary frequency and urgency and was recently treated with p.o. Keflex for UTI that she is finishing soon.  No cough or wheezing or dyspnea.  No new paresthesias or focal muscle weakness.  ED Course: When she came to the ER, vital signs were within normal and later BP was 142/93.  Labs revealed blood gases with pH 7.48 and HCO3 of 35.7, PA CO2 of 48 and PO2 of 66 with O2 sat of 94.9%.  CMP showed potassium of 3.3 with CO2 of 34 and CBC showed anemia close to baseline.   Imaging: 1 view abdomen x-ray showed intact right frontal ventriculoperitoneal shunt, cholelithiasis and normal abdominal gas pattern.  C-spine x-ray showed the same with a shunt series as well as skull x-ray.  Portable chest ray showed no acute cardiopulmonary disease and did show emphysema left elbow x-ray was negative.  Noncontrasted CT scan showed progressive ventriculomegaly when compared to prior examination on 04/17/2022 with stable right frontal ventriculostomy catheter and stable advanced senescent  changes and remote right thalamic infarct with no acute intracranial abnormalities.  The patient was given 1 g of p.o. Tylenol.  Dr. [name] was notified about the patient and recommended shunt series.  She will be admitted to a medical telemetry bed for further evaluation and management. PAST MEDICAL HISTORY:   Past Medical History:  Diagnosis Date   Acute respiratory failure requiring reintubation ([place]) 11/05/2021   Anxiety    Anxiety and depression    COPD (chronic obstructive pulmonary disease) (HCC)    CVA (cerebral vascular accident) ([place])    CVA (cerebral vascular accident) ([place])    L sided deficits   Dementia ([place])    Depression    Diabetes mellitus ([place])    Type 2   Diabetes mellitus without complication ([place])    Dysphagia    Dysphasia    History of lung cancer    Hyperlipidemia    Influenza A 12/13/2021   Memory disturbance 11/20/2017   Normal pressure hydrocephalus ([place]) 2018   Retinal detachment    Right   Right carotid bruit 11/20/2017   Tardive dyskinesia 02/01/2020   Tardive dyskinesia    Thrombosis    Arterial to lower extremity?    PAST SURGICAL HISTORY:   Past Surgical History:  Procedure Laterality Date   LUNG REMOVAL, PARTIAL     left upper lobe   VENTRICULOPERITONEAL SHUNT  12/09/2016   VENTRICULOPERITONEAL SHUNT      SOCIAL HISTORY:   Social History   Tobacco  Use   Smoking status: Former    Packs/day: 0.50    Years: 50.00    Total pack years: 25.00    Types: Cigarettes    Quit date: 08/01/2017    Years since quitting: 4.9   Smokeless tobacco: Never  Substance Use Topics   Alcohol use: Not Currently    FAMILY HISTORY:   Family History  Problem Relation Age of Onset   Pneumonia Mother    Cancer Father    Diabetes Sister    Diabetes Sister    Breast cancer Neg Hx     DRUG ALLERGIES:   Allergies  Allergen Reactions   Lexapro [Escitalopram Oxalate] Other (See Comments)    Dizziness, nausea, fatigue   Neosporin   [Neomycin-Polymyxin-Gramicidin] Rash    (Neosporin)   Neosporin [Bacitracin-Polymyxin B] Rash    REVIEW OF SYSTEMS:   ROS As per history of present illness. All pertinent systems were reviewed above. Constitutional, HEENT, cardiovascular, respiratory, GI, GU, musculoskeletal, neuro, psychiatric, endocrine, integumentary and hematologic systems were reviewed and are otherwise negative/unremarkable except for positive findings mentioned above in the HPI.   MEDICATIONS AT HOME:   Prior to Admission medications   Medication Sig Start Date End Date Taking? Authorizing Provider  Accu-Chek Softclix Lancets lancets Use as instructed 04/04/22  Yes Dugal, Tabitha, FNP  buPROPion (WELLBUTRIN XL) 150 MG 24 hr tablet 1 qam for 5 days then 2  qam Patient taking differently: Take 300 mg by mouth daily with breakfast. 03/12/22  Yes Plovsky, Berneta Sages, MD  busPIRone (BUSPAR) 15 MG tablet 3  qam   2 qpm Patient taking differently: Take 30 mg by mouth 2 (two) times daily. 03/12/22  Yes Plovsky, Berneta Sages, MD  cephALEXin (KEFLEX) 500 MG capsule Take 1 capsule (500 mg total) by mouth 2 (two) times daily for 7 days. 07/18/22 07/25/22 Yes Tempie Hoist, FNP  clonazePAM (KLONOPIN) 1 MG tablet 1 qhs Patient taking differently: Take 1 mg by mouth at bedtime. 03/12/22  Yes Plovsky, Berneta Sages, MD  Cyanocobalamin (B-12 PO) Take 1 capsule by mouth daily.   Yes [provider]  folic acid (FOLVITE) 1 MG tablet Take 1 tablet (1 mg total) by mouth daily. 12/13/21  Yes Pleas Koch, NP  glipiZIDE (GLUCOTROL XL) 2.5 MG 24 hr tablet TAKE 1 TABLET (2.5 MG TOTAL) BY MOUTH DAILY WITH BREAKFAST. FOR DIABETES. 04/10/22  Yes Pleas Koch, NP  memantine (NAMENDA) 5 MG tablet Take 1 tablet (5 mg total) by mouth 2 (two) times daily. For memory. 07/14/22  Yes Pleas Koch, NP  OXYGEN 3 L continuous.    Yes [provider]  rosuvastatin (CRESTOR) 20 MG tablet TAKE 1 TABLET (20 MG TOTAL) BY MOUTH EVERY EVENING. FOR  CHOLESTEROL. 07/13/22  Yes Pleas Koch, NP  tamsulosin (FLOMAX) 0.4 MG CAPS capsule Take 1 capsule (0.4 mg total) by mouth daily. For urine retention 12/13/21  Yes Pleas Koch, NP  Tiotropium Bromide-Olodaterol (STIOLTO RESPIMAT) 2.5-2.5 MCG/ACT AERS INHALE 2 PUFFS BY MOUTH INTO THE LUNGS DAILY Patient taking differently: Inhale 2 puffs into the lungs daily. 12/26/21  Yes Young, Tarri Fuller D, MD  acetaminophen (TYLENOL) 500 MG tablet Take 1,000 mg by mouth every 6 (six) hours as needed for moderate pain.    [provider]  albuterol (PROVENTIL) (2.5 MG/3ML) 0.083% nebulizer solution USE 1 VIAL IN NEBULIZER EVERY 6 HOURS Patient taking differently: Take 2.5 mg by nebulization every 6 (six) hours as needed for wheezing or shortness of breath. 07/20/21  Baird Lyons D, MD  albuterol (VENTOLIN HFA) 108 (90 Base) MCG/ACT inhaler INHALE 2 PUFFS BY MOUTH EVERY 4 HOURS AS NEEDED 07/11/22   Baird Lyons D, MD  glucose blood test strip Use as instructed 04/04/22   Eugenia Pancoast, FNP  guaiFENesin (MUCINEX) 600 MG 12 hr tablet Take 1 tablet (600 mg total) by mouth 2 (two) times daily. Patient taking differently: Take 600 mg by mouth as needed. 11/11/21   Antonieta Pert, MD  Tiotropium Bromide Monohydrate (SPIRIVA RESPIMAT) 1.25 MCG/ACT AERS Inhale 2 puffs into the lungs daily. Patient not taking: Reported on 07/24/2022 07/01/22   Baird Lyons D, MD      VITAL SIGNS:  Blood pressure (!) 156/66, pulse 70, temperature 99 F (37.2 C), temperature source Oral, resp. rate 18, weight 49 kg, SpO2 98 %.  PHYSICAL EXAMINATION:  Physical Exam  GENERAL:  76 y.o.-year-old African-American female patient lying in the bed with no acute distress.  EYES: Pupils equal, round, reactive to light and accommodation. No scleral icterus. Extraocular muscles intact.  HEENT: Head atraumatic, normocephalic. Oropharynx and nasopharynx clear.  NECK:  Supple, no jugular venous distention. No thyroid enlargement, no  tenderness.  LUNGS: Normal breath sounds bilaterally, no wheezing, rales,rhonchi or crepitation. No use of accessory muscles of respiration.  CARDIOVASCULAR: Regular rate and rhythm, S1, S2 normal. No murmurs, rubs, or gallops.  ABDOMEN: Soft, nondistended, nontender. Bowel sounds present. No organomegaly or mass.  EXTREMITIES: No pedal edema, cyanosis, or clubbing.  NEUROLOGIC: Cranial nerves II through XII are intact. Muscle strength 5/5 in all extremities. Sensation intact. Gait not checked.  PSYCHIATRIC: The patient is alert and oriented x 3.  Normal affect and good eye contact. SKIN: No obvious rash, lesion, or ulcer.   LABORATORY PANEL:   CBC Recent Labs  Lab 07/24/22 1818  WBC 7.9  HGB 10.1*  HCT 33.7*  PLT 293   ------------------------------------------------------------------------------------------------------------------  Chemistries  Recent Labs  Lab 07/24/22 1818  NA 143  K 3.3*  CL 101  CO2 34*  GLUCOSE 97  BUN 12  CREATININE 0.63  CALCIUM 9.6  AST 16  ALT 15  ALKPHOS 58  BILITOT 0.5   ------------------------------------------------------------------------------------------------------------------  Cardiac Enzymes No results for input(s): "TROPONINI" in the last 168 hours. ------------------------------------------------------------------------------------------------------------------  RADIOLOGY:  DG Cervical Spine 1 View  Result Date: 07/25/2022 CLINICAL DATA:  Shunt malfunction EXAM: DG CERVICAL SPINE - 1 VIEW; ABDOMEN - 1 VIEW; SKULL - 1-3 VIEW COMPARISON:  None Available. FINDINGS: Right frontal ventriculoperitoneal shunt is in place. The extracranial catheter appears intact along its cervical and abdominal course with the tip seen looped within the mid pelvis. The thoracic component seen on chest radiograph of 07/24/2022 and appears intact on that examination. Paranasal sinuses are clear. Normal abdominal gas pattern. Cholelithiasis noted.  IMPRESSION: 1. Intact right frontal ventriculoperitoneal shunt. 2. Cholelithiasis. 3. Normal abdominal gas pattern. Electronically Signed   By: Fidela Salisbury M.D.   On: 07/25/2022 04:03   DG Skull 1-3 Views  Result Date: 07/25/2022 CLINICAL DATA:  Shunt malfunction EXAM: DG CERVICAL SPINE - 1 VIEW; ABDOMEN - 1 VIEW; SKULL - 1-3 VIEW COMPARISON:  None Available. FINDINGS: Right frontal ventriculoperitoneal shunt is in place. The extracranial catheter appears intact along its cervical and abdominal course with the tip seen looped within the mid pelvis. The thoracic component seen on chest radiograph of 07/24/2022 and appears intact on that examination. Paranasal sinuses are clear. Normal abdominal gas pattern. Cholelithiasis noted. IMPRESSION: 1. Intact right frontal ventriculoperitoneal shunt. 2.  Cholelithiasis. 3. Normal abdominal gas pattern. Electronically Signed   By: Fidela Salisbury M.D.   On: 07/25/2022 04:03   DG Abd 1 View  Result Date: 07/25/2022 CLINICAL DATA:  Shunt malfunction EXAM: DG CERVICAL SPINE - 1 VIEW; ABDOMEN - 1 VIEW; SKULL - 1-3 VIEW COMPARISON:  None Available. FINDINGS: Right frontal ventriculoperitoneal shunt is in place. The extracranial catheter appears intact along its cervical and abdominal course with the tip seen looped within the mid pelvis. The thoracic component seen on chest radiograph of 07/24/2022 and appears intact on that examination. Paranasal sinuses are clear. Normal abdominal gas pattern. Cholelithiasis noted. IMPRESSION: 1. Intact right frontal ventriculoperitoneal shunt. 2. Cholelithiasis. 3. Normal abdominal gas pattern. Electronically Signed   By: Fidela Salisbury M.D.   On: 07/25/2022 04:03   CT HEAD WO CONTRAST (5MM)  Result Date: 07/25/2022 CLINICAL DATA:  Mental status change, unknown cause EXAM: CT HEAD WITHOUT CONTRAST TECHNIQUE: Contiguous axial images were obtained from the base of the skull through the vertex without intravenous contrast. RADIATION  DOSE REDUCTION: This exam was performed according to the departmental dose-optimization program which includes automated exposure control, adjustment of the mA and/or kV according to patient size and/or use of iterative reconstruction technique. COMPARISON:  04/17/2022 FINDINGS: Brain: Mild parenchymal volume loss is commensurate with the patient's age and appears stable since prior examination. Extensive subcortical and periventricular white matter changes are present likely reflecting the sequela of small vessel ischemia. Right frontal ventriculostomy is unchanged with its tip within the anterior horn of the right lateral ventricle abutting the septum flu some. There is progressive ventriculomegaly when compared to prior examination. Remote infarct noted within the right thalamus. No acute intracranial hemorrhage or infarct. No abnormal mass effect or midline shift. No abnormal intra or extra-axial mass lesion. Cerebellum unremarkable. Vascular: Extensive vascular calcifications within the carotid siphons. No asymmetric hyperdense vasculature at the skull base. Skull: Normal. Negative for fracture or focal lesion. Sinuses/Orbits: No acute finding. Other: Mastoid air cells and middle ear cavities are clear. IMPRESSION: 1. Progressive ventriculomegaly when compared to prior examination of 04/17/2022. Stable right frontal ventriculostomy catheter. 2. Stable advanced senescent changes and remote right thalamic infarct. 3. No acute intracranial hemorrhage or infarct. Electronically Signed   By: Fidela Salisbury M.D.   On: 07/25/2022 01:57   DG Chest Portable 1 View  Result Date: 07/25/2022 CLINICAL DATA:  Altered mental status. EXAM: PORTABLE CHEST 1 VIEW COMPARISON:  Chest radiograph dated 04/16/2022. FINDINGS: Minimal bibasilar atelectasis/scarring. No focal consolidation, pleural effusion or pneumothorax. Background of emphysema. Similar appearance of soft tissue density and associated surgical clips in the left  suprahilar/periaortic region. No acute osseous pathology. Shunt tube noted over the right side of the chest. IMPRESSION: 1. No acute cardiopulmonary process. 2. Emphysema. Electronically Signed   By: Anner Crete M.D.   On: 07/25/2022 01:39   DG ELBOW COMPLETE LEFT (3+VIEW)  Result Date: 07/25/2022 CLINICAL DATA:  [place] 1 week ago with continued left elbow pain. EXAM: LEFT ELBOW - COMPLETE 3+ VIEW COMPARISON:  None Available. FINDINGS: There is no evidence of fracture, dislocation, or joint effusion. There is no evidence of arthropathy or other focal bone abnormality. Soft tissues are unremarkable. IMPRESSION: Negative. Electronically Signed   By: Telford Nab M.D.   On: 07/25/2022 01:38      IMPRESSION AND PLAN:  Assessment and Plan: * NPH (normal pressure hydrocephalus) (HCC) - The patient is presenting with acute metabolic encephalopathy with associated ataxia and urinary incontinence concerning about  her worsening hydrocephalus and ventriculomegaly - She will be admitted to a medical telemetry bed. - Neurosurgery consultation will be obtained. - Dr. [name] was notified about the patient and is aware.  Hypokalemia Potassium will be replaced and magnesium level will be checked.  Dementia without behavioral disturbance ([place]) - We will continue Namenda and place her on as needed IM Haldol for agitation.  Anxiety and depression  - We will continue BuSpar and Wellbutrin XL as well as Klonopin.  Hyperlipidemia - We will continue statin therapy   DVT prophylaxis: Lovenox..  Advanced Care Planning:  Code Status: DNR/DNI.  This was discussed with her daughter.  Family Communication:  The plan of care was discussed in details with the patient (and family). I answered all questions. The patient agreed to proceed with the above mentioned plan. Further management will depend upon hospital course. Disposition Plan: Back to previous home environment Consults called: Neurosurgery  consult All the records are reviewed and case discussed with ED provider.  Status is: Inpatient   At the time of the admission, it appears that the appropriate admission status for this patient is inpatient.  This is judged to be reasonable and necessary in order to provide the required intensity of service to ensure the patient's safety given the presenting symptoms, physical exam findings and initial radiographic and laboratory data in the context of comorbid conditions.  The patient requires inpatient status due to high intensity of service, high risk of further deterioration and high frequency of surveillance required.  I certify that at the time of admission, it is my clinical judgment that the patient will require inpatient hospital care extending more than 2 midnights.                            Dispo: The patient is from: Home              Anticipated d/c is to: Home              Patient currently is not medically stable to d/c.              Difficult to place patient: No  Christel Mormon M.D on 07/25/2022 at 6:30 AM  [facility]   From 7 PM-7 AM, contact night-coverage [email or web address]  CC: Primary care physician; Pleas Koch, NP

## 2022-07-25 NOTE — Assessment & Plan Note (Signed)
-   We will continue statin therapy. 

## 2022-07-25 NOTE — ED Notes (Signed)
Spoke with daughter, gave update.

## 2022-07-25 NOTE — Evaluation (Signed)
Physical Therapy Evaluation Patient Details Name: Amanda Porter MRN: [id number] DOB: 04/30/1946 Today's Date: 07/25/2022  History of Present Illness  Pt is a 76 yo female that presented to the ED for chief comlplaint of AMS, agitation, urinary incontinence, 1 fall, unsteady gait. Per neurosurgery during admission plan is to perform high-volume lumbar punture. PMH includes CVA, COPD, s/p VP shunt, dementia, TD, and DM.   Clinical Impression  Patient alert, oriented to self and hospital, reported "[place]" and the date as may 8th, 2014. Some PLOF questionable but per chart lives with her daughter (pt stated she lives with her sister) who performs IADLs, assists with medications, bathing set up and transfers in/out of tub (does have a shower stool). Pt reported falling but unable to recall when/how often. ModI with rollator.  The patient was able to perform bed mobility modI. Sit <> stand with supervision and RW, and ambulated ~40ft with CGA. Did tend to veer to the left, difficulty navigating obstacles as well. minA to assist with this intermittently. Tinetti Balance Assessment tool utilized, see below for details, but does indicate pt is at moderate risk of falls.  Overall the patient demonstrated deficits (see "PT Problem List") that impede the patient's functional abilities, safety, and mobility and would benefit from skilled PT intervention. Recommendation at this time is HHPT with intermittent supervision to maximize safety, function, and mobility.   TINETTI BALANCE ASSESSMENT TOOL BALANCE SECTION Sitting Balance:     1/1  Rises from Chair:   1/2   Attempts to Rise:   2/2  Standing Balance (1st 5 seconds) 1/2  Standing balance:   1/2 Nudged:    1/2 Eyes Closed:    1/1 Turning 360 degrees   0/1      1/1 Sitting down (getting seated):    0/2  Balance Score:    9/16  GAIT SECTION Indication of Gait:   1/1 Step Length and Height  L: 1/1                                                             R: 1/1  Foot Clearance:   L: 1/1                                                            R: 1/1 Step Symmetry:   1/1 Step Continuity:   1/1 Path:     0/2 Trunk:     2/2 Walking Time:    1/1 Gait Score:    10/12  Combined Score:   19/28          Recommendations for follow up therapy are one component of a multi-disciplinary discharge planning process, led by the attending physician.  Recommendations may be updated based on patient status, additional functional criteria and insurance authorization.  Follow Up Recommendations Home health PT      Assistance Recommended at Discharge Intermittent Supervision/Assistance  Patient can return home with the following  A little help with bathing/dressing/bathroom;Assistance with cooking/housework;Assist for transportation;Help with stairs or ramp for entrance;Direct supervision/assist for medications management    Equipment Recommendations None recommended by  PT  Recommendations for Other Services       Functional Status Assessment Patient has had a recent decline in their functional status and demonstrates the ability to make significant improvements in function in a reasonable and predictable amount of time.     Precautions / Restrictions Precautions Precautions: Fall Restrictions Weight Bearing Restrictions: No      Mobility  Bed Mobility Overal bed mobility: Modified Independent                  Transfers Overall transfer level: Needs assistance Equipment used: Rolling walker (2 wheels) Transfers: Sit to/from Stand Sit to Stand: Supervision                Ambulation/Gait   Gait Distance (Feet): 65 Feet Assistive device: Rolling walker (2 wheels)         General Gait Details: step through gait with reciprocal gait pattern, but did tend to have gait path deviations/drives RW to the L. difficulty navigating obstacles  Stairs            Wheelchair Mobility    Modified Rankin  (Stroke Patients Only)       Balance Overall balance assessment: Needs assistance Sitting-balance support: Feet supported Sitting balance-Leahy Scale: Good       Standing balance-Leahy Scale: Fair Standing balance comment: able to statically stand                             Pertinent Vitals/Pain Pain Assessment Pain Assessment: 0-10 Pain Score: 8  Pain Location: L shoulder pain Pain Descriptors / Indicators: Aching, Sore Pain Intervention(s): Repositioned, Monitored during session    [place] expects to be discharged to:: Private residence Living Arrangements: Children (pt claims to live with her sister) Available Help at Discharge: Family;Available PRN/intermittently Type of Home: House Home Access: Level entry       Home Layout: One level Home Equipment: Rollator (4 wheels);Shower seat;Grab bars - tub/shower Additional Comments: per chart pt is alone during the day    Prior Function Prior Level of Function : Patient poor historian/Family not available             Mobility Comments: pt describes rollator as DME use in home ADLs Comments: Daughter reports pt is mod I with basic ADLs. sponge bathes unless daughter is there to supervise shower transfer per chart and pt     Hand Dominance        Extremity/Trunk Assessment   Upper Extremity Assessment Upper Extremity Assessment: Overall WFL for tasks assessed    Lower Extremity Assessment Lower Extremity Assessment: Generalized weakness       Communication   Communication: No difficulties  Cognition Arousal/Alertness: Awake/alert Behavior During Therapy: WFL for tasks assessed/performed Overall Cognitive Status: No family/caregiver present to determine baseline cognitive functioning                                 General Comments: pt oriented to self, "hospital" but says [place], and that the date is May 8th, 2014.        General Comments       Exercises     Assessment/Plan    PT Assessment Patient needs continued PT services  PT Problem List Decreased mobility;Decreased balance;Decreased activity tolerance;Decreased knowledge of use of DME       PT Treatment Interventions DME instruction;Therapeutic exercise;Balance training;Gait training;Stair training;Neuromuscular re-education;Functional  mobility training;Therapeutic activities;Patient/family education    PT Goals (Current goals can be found in the Care Plan section)  Acute Rehab PT Goals Patient Stated Goal: to go home PT Goal Formulation: With patient Time For Goal Achievement: 08/08/22 Potential to Achieve Goals: Good    Frequency Min 2X/week     Co-evaluation               AM-PAC PT "6 Clicks" Mobility  Outcome Measure Help needed turning from your back to your side while in a flat bed without using bedrails?: None Help needed moving from lying on your back to sitting on the side of a flat bed without using bedrails?: None Help needed moving to and from a bed to a chair (including a wheelchair)?: None Help needed standing up from a chair using your arms (e.g., wheelchair or bedside chair)?: None Help needed to walk in hospital room?: None Help needed climbing 3-5 steps with a railing? : A Little 6 Click Score: 23    End of Session Equipment Utilized During Treatment: Gait belt Activity Tolerance: Patient tolerated treatment well Patient left: in bed;with call bell/phone within reach;with bed alarm set Nurse Communication: Mobility status PT Visit Diagnosis: Other abnormalities of gait and mobility (R26.89)    Time: 4742-5956 PT Time Calculation (min) (ACUTE ONLY): 28 min   Charges:   PT Evaluation $PT Eval Low Complexity: 1 Low PT Treatments $Therapeutic Activity: 8-22 mins        Lieutenant Diego PT, DPT 2:45 PM,07/25/22

## 2022-07-25 NOTE — ED Notes (Signed)
Pharmacy was messaged to adjust buspirone dosage per daughter [name] request (ordered dosage was incorrect).  Pt came to door naked with gown in hand. Pt redirected back to bed and gown placed on npt.

## 2022-07-25 NOTE — Progress Notes (Signed)
PROGRESS NOTE    Amanda Porter  WER:[id number] DOB: 10/23/1946 DOA: 07/24/2022 PCP: Pleas Koch, NP    Assessment & Plan:   Principal Problem:   NPH (normal pressure hydrocephalus) (HCC) Active Problems:   Hypokalemia   Dementia without behavioral disturbance (HCC)   Anxiety and depression   Hyperlipidemia  Assessment and Plan: NPH: presenting with acute metabolic encephalopathy, ataxia and urinary incontinence concerning about her worsening hydrocephalus & ventriculomegaly. Imaging showing intact ventriculoperitoneal shunt. Neuro surg consult    Hypokalemia: WNL today    Dementia: continue on namenda. Haldol prn for agitation   Depression: severity unknown. Continue on home dose of buspar, wellbutrin  DM2: HbA1c 7.2, poorly controlled. Holding home glipizide. Start SSI w/ accuchecks   Anxiety: severity unknown. Continue on home dose of klonopin   HLD: continue on statin   Normocytic anemia: no need for a transfusion currently. Will continue to monitor      DVT prophylaxis: lovenox  Code Status:  DNR Family Communication: discussed pt's care w/ pt's daughter, [name], and answered her questions  Disposition Plan: depends on PT/OT recs .  Level of care: Telemetry Medical  Status is: Inpatient Remains inpatient appropriate because: severity of illness   Consultants:  Neuro surg   Procedures:   Antimicrobials:   Subjective: Pt is pleasantly confused  Objective: Vitals:   07/25/22 1000 07/25/22 1030 07/25/22 1156 07/25/22 1400  BP: 135/73 (!) 132/59 (!) 173/68 (!) 154/75  Pulse: 73 70 90 94  Resp:  20 20 17   Temp:   98.4 F (36.9 C) 98.7 F (37.1 C)  TempSrc:   Oral   SpO2: 100% 100% 99% 96%  Weight:        Intake/Output Summary (Last 24 hours) at 07/25/2022 1610 Last data filed at 07/25/2022 0915 Gross per 24 hour  Intake 240 ml  Output --  Net 240 ml   Filed Weights   07/24/22 1810  Weight: 49 kg    Examination:  General exam:  Appears calm and comfortable. Female pattern facial hair distrubution.  Respiratory system: Clear to auscultation. Respiratory effort normal. Cardiovascular system: S1 & S2+. No  rubs, gallops or clicks. Gastrointestinal system: Abdomen is nondistended, soft and nontender. Normal bowel sounds heard. Central nervous system: Awake but confused. Oriented to self & place only.  Psychiatry: Judgement and insight appears poor. Flat mood and affect    Data Reviewed: I have personally reviewed following labs and imaging studies  CBC: Recent Labs  Lab 07/24/22 1818 07/25/22 0923  WBC 7.9 5.9  NEUTROABS 4.3  --   HGB 10.1* 9.8*  HCT 33.7* 31.8*  MCV 92.1 89.3  PLT 293 195   Basic Metabolic Panel: Recent Labs  Lab 07/24/22 1817 07/24/22 1818 07/25/22 0923  NA  --  143 143  K  --  3.3* 4.2  CL  --  101 105  CO2  --  34* 31  GLUCOSE  --  97 147*  BUN  --  12 10  CREATININE  --  0.63 0.61  CALCIUM  --  9.6 8.9  MG 2.0  --   --    GFR: Estimated Creatinine Clearance: 45.6 mL/min (by C-G formula based on SCr of 0.61 mg/dL). Liver Function Tests: Recent Labs  Lab 07/24/22 1818  AST 16  ALT 15  ALKPHOS 58  BILITOT 0.5  PROT 7.5  ALBUMIN 3.8   No results for input(s): "LIPASE", "AMYLASE" in the last 168 hours. No results for input(s): "AMMONIA"  in the last 168 hours. Coagulation Profile: No results for input(s): "INR", "PROTIME" in the last 168 hours. Cardiac Enzymes: No results for input(s): "CKTOTAL", "CKMB", "CKMBINDEX", "TROPONINI" in the last 168 hours. BNP (last 3 results) Recent Labs    04/04/22 1025  PROBNP 13.0   HbA1C: No results for input(s): "HGBA1C" in the last 72 hours. CBG: No results for input(s): "GLUCAP" in the last 168 hours. Lipid Profile: No results for input(s): "CHOL", "HDL", "LDLCALC", "TRIG", "CHOLHDL", "LDLDIRECT" in the last 72 hours. Thyroid Function Tests: No results for input(s): "TSH", "T4TOTAL", "FREET4", "T3FREE", "THYROIDAB" in the  last 72 hours. Anemia Panel: No results for input(s): "VITAMINB12", "FOLATE", "FERRITIN", "TIBC", "IRON", "RETICCTPCT" in the last 72 hours. Sepsis Labs: No results for input(s): "PROCALCITON", "LATICACIDVEN" in the last 168 hours.  Recent Results (from the past 240 hour(s))  SARS Coronavirus 2 by RT PCR (hospital order, performed in [facility] hospital lab) *cepheid single result test* Anterior Nasal Swab     Status: None   Collection Time: 07/24/22  9:55 PM   Specimen: Anterior Nasal Swab  Result Value Ref Range Status   SARS Coronavirus 2 by RT PCR NEGATIVE NEGATIVE Final    Comment: (NOTE) SARS-CoV-2 target nucleic acids are NOT DETECTED.  The SARS-CoV-2 RNA is generally detectable in upper and lower respiratory specimens during the acute phase of infection. The lowest concentration of SARS-CoV-2 viral copies this assay can detect is 250 copies / mL. A negative result does not preclude SARS-CoV-2 infection and should not be used as the sole basis for treatment or other patient management decisions.  A negative result may occur with improper specimen collection / handling, submission of specimen other than nasopharyngeal swab, presence of viral mutation(s) within the areas targeted by this assay, and inadequate number of viral copies (<250 copies / mL). A negative result must be combined with clinical observations, patient history, and epidemiological information.  Fact Sheet for Patients:   https://www.patel.info/  Fact Sheet for Healthcare Providers: https://hall.com/  This test is not yet approved or  cleared by the [place] FDA and has been authorized for detection and/or diagnosis of SARS-CoV-2 by FDA under an Emergency Use Authorization (EUA).  This EUA will remain in effect (meaning this test can be used) for the duration of the COVID-19 declaration under Section 564(b)(1) of the Act, 21 U.S.C. section 360bbb-3(b)(1),  unless the authorization is terminated or revoked sooner.  Performed at [facility], [address] [place], [place] [address]          Radiology Studies: IR FL GUIDED LOC OF NEEDLE/CATH TIP FOR SPINAL INJECT RT  Result Date: 07/25/2022 CLINICAL DATA:  Normal pressure hydrocephalus, high volume lumbar puncture requested EXAM: DIAGNOSTIC LUMBAR PUNCTURE UNDER FLUOROSCOPIC GUIDANCE COMPARISON:  None Available. FLUOROSCOPY: Radiation Exposure Index (as provided by the fluoroscopic device): 0.5 minutes (5 mGy) PROCEDURE: Informed consent was obtained from the patient prior to the procedure, including potential complications of headache, allergy, and pain. With the patient lateral decubitus, the lower back was prepped with chlorhexidine. 1% Lidocaine was used for local anesthesia. Lumbar puncture was performed at the L4-L5 level using a 20 gauge spinal gauge needle with return of clear, colorless CSF with an opening pressure of 16 cm H2O. Approximately 30 ml of CSF was obtained. The patient tolerated the procedure well and there were no apparent complications. IMPRESSION: Successful high volume lumbar puncture using fluoroscopic guidance with removal of 30 mL clear, colorless CSF. Electronically Signed   By: Murrell Redden  El-Abd M.D.   On: 07/25/2022 15:25   DG Cervical Spine 1 View  Result Date: 07/25/2022 CLINICAL DATA:  Shunt malfunction EXAM: DG CERVICAL SPINE - 1 VIEW; ABDOMEN - 1 VIEW; SKULL - 1-3 VIEW COMPARISON:  None Available. FINDINGS: Right frontal ventriculoperitoneal shunt is in place. The extracranial catheter appears intact along its cervical and abdominal course with the tip seen looped within the mid pelvis. The thoracic component seen on chest radiograph of 07/24/2022 and appears intact on that examination. Paranasal sinuses are clear. Normal abdominal gas pattern. Cholelithiasis noted. IMPRESSION: 1. Intact right frontal ventriculoperitoneal shunt. 2. Cholelithiasis. 3. Normal  abdominal gas pattern. Electronically Signed   By: Fidela Salisbury M.D.   On: 07/25/2022 04:03   DG Skull 1-3 Views  Result Date: 07/25/2022 CLINICAL DATA:  Shunt malfunction EXAM: DG CERVICAL SPINE - 1 VIEW; ABDOMEN - 1 VIEW; SKULL - 1-3 VIEW COMPARISON:  None Available. FINDINGS: Right frontal ventriculoperitoneal shunt is in place. The extracranial catheter appears intact along its cervical and abdominal course with the tip seen looped within the mid pelvis. The thoracic component seen on chest radiograph of 07/24/2022 and appears intact on that examination. Paranasal sinuses are clear. Normal abdominal gas pattern. Cholelithiasis noted. IMPRESSION: 1. Intact right frontal ventriculoperitoneal shunt. 2. Cholelithiasis. 3. Normal abdominal gas pattern. Electronically Signed   By: Fidela Salisbury M.D.   On: 07/25/2022 04:03   DG Abd 1 View  Result Date: 07/25/2022 CLINICAL DATA:  Shunt malfunction EXAM: DG CERVICAL SPINE - 1 VIEW; ABDOMEN - 1 VIEW; SKULL - 1-3 VIEW COMPARISON:  None Available. FINDINGS: Right frontal ventriculoperitoneal shunt is in place. The extracranial catheter appears intact along its cervical and abdominal course with the tip seen looped within the mid pelvis. The thoracic component seen on chest radiograph of 07/24/2022 and appears intact on that examination. Paranasal sinuses are clear. Normal abdominal gas pattern. Cholelithiasis noted. IMPRESSION: 1. Intact right frontal ventriculoperitoneal shunt. 2. Cholelithiasis. 3. Normal abdominal gas pattern. Electronically Signed   By: Fidela Salisbury M.D.   On: 07/25/2022 04:03   CT HEAD WO CONTRAST (5MM)  Result Date: 07/25/2022 CLINICAL DATA:  Mental status change, unknown cause EXAM: CT HEAD WITHOUT CONTRAST TECHNIQUE: Contiguous axial images were obtained from the base of the skull through the vertex without intravenous contrast. RADIATION DOSE REDUCTION: This exam was performed according to the departmental dose-optimization program  which includes automated exposure control, adjustment of the mA and/or kV according to patient size and/or use of iterative reconstruction technique. COMPARISON:  04/17/2022 FINDINGS: Brain: Mild parenchymal volume loss is commensurate with the patient's age and appears stable since prior examination. Extensive subcortical and periventricular white matter changes are present likely reflecting the sequela of small vessel ischemia. Right frontal ventriculostomy is unchanged with its tip within the anterior horn of the right lateral ventricle abutting the septum flu some. There is progressive ventriculomegaly when compared to prior examination. Remote infarct noted within the right thalamus. No acute intracranial hemorrhage or infarct. No abnormal mass effect or midline shift. No abnormal intra or extra-axial mass lesion. Cerebellum unremarkable. Vascular: Extensive vascular calcifications within the carotid siphons. No asymmetric hyperdense vasculature at the skull base. Skull: Normal. Negative for fracture or focal lesion. Sinuses/Orbits: No acute finding. Other: Mastoid air cells and middle ear cavities are clear. IMPRESSION: 1. Progressive ventriculomegaly when compared to prior examination of 04/17/2022. Stable right frontal ventriculostomy catheter. 2. Stable advanced senescent changes and remote right thalamic infarct. 3. No acute intracranial hemorrhage or infarct.  Electronically Signed   By: Fidela Salisbury M.D.   On: 07/25/2022 01:57   DG Chest Portable 1 View  Result Date: 07/25/2022 CLINICAL DATA:  Altered mental status. EXAM: PORTABLE CHEST 1 VIEW COMPARISON:  Chest radiograph dated 04/16/2022. FINDINGS: Minimal bibasilar atelectasis/scarring. No focal consolidation, pleural effusion or pneumothorax. Background of emphysema. Similar appearance of soft tissue density and associated surgical clips in the left suprahilar/periaortic region. No acute osseous pathology. Shunt tube noted over the right side of  the chest. IMPRESSION: 1. No acute cardiopulmonary process. 2. Emphysema. Electronically Signed   By: Anner Crete M.D.   On: 07/25/2022 01:39   DG ELBOW COMPLETE LEFT (3+VIEW)  Result Date: 07/25/2022 CLINICAL DATA:  [place] 1 week ago with continued left elbow pain. EXAM: LEFT ELBOW - COMPLETE 3+ VIEW COMPARISON:  None Available. FINDINGS: There is no evidence of fracture, dislocation, or joint effusion. There is no evidence of arthropathy or other focal bone abnormality. Soft tissues are unremarkable. IMPRESSION: Negative. Electronically Signed   By: Telford Nab M.D.   On: 07/25/2022 01:38        Scheduled Meds:  [START ON 07/26/2022] arformoterol  15 mcg Nebulization BID   And   [START ON 07/26/2022] umeclidinium bromide  1 puff Inhalation Daily   buPROPion  300 mg Oral Q breakfast   busPIRone  45 mg Oral Daily   And   busPIRone  30 mg Oral QHS   clonazePAM  1 mg Oral QHS   cyanocobalamin   Oral Daily   enoxaparin (LOVENOX) injection  40 mg Subcutaneous [id number]   folic acid  1 mg Oral Daily   insulin aspart  0-6 Units Subcutaneous TID WC   memantine  5 mg Oral BID   rosuvastatin  20 mg Oral QPM   tamsulosin  0.4 mg Oral Daily   Continuous Infusions:  0.9 % NaCl with KCl 20 mEq / L 100 mL/hr at 07/25/22 0859     LOS: 0 days    Time spent: 35 mins    Wyvonnia Dusky, MD [facility] Pager [phone]  If 7PM-7AM, please contact night-coverage [email or web address] 07/25/2022, 4:10 PM

## 2022-07-25 NOTE — ED Provider Notes (Signed)
-----------------------------------------   1:31 AM on 07/25/2022 -----------------------------------------  Assuming care from Dr. Quentin Cornwall.  In short, Amanda Porter is a 76 y.o. female with a chief complaint of AMS, unsteadiness, urinary incontinence, concern for UTI (pseudomonal).  Refer to the original H&P for additional details.  The current plan of care is to follow up imaging and reassess.   Clinical Course as of 07/25/22 0727  Fri Jul 25, 2022  0141 DG ELBOW COMPLETE LEFT (3+VIEW) I viewed and interpreted the patient's elbow x-rays and I see no sign of fracture or dislocation.  I also read the radiologist's report, which confirmed no acute findings. [CF]  0158 DG Chest Portable 1 View [CF]  [phone] I viewed and interpreted the patient's head CT.  I cannot identify a specific acute abnormality, but the radiologist commented that the patient has increased ventriculomegaly compared to prior about 3 months ago.  I reassessed the patient.  She is resting comfortably at this time.  Her daughter provided a lot more information.  The patient originally had the shunt placed at [facility].  They then followed up at [facility] but are currently in the process of establishing care at [facility], but have not yet established neurosurgical care.  The patient's daughter reports that she has been increasingly unsteady over the last few weeks and having difficulty walking.  She is also developed urinary incontinence.  On top of that she had acute altered mental status today.  Although this could represent recurrence of her urinary tract infection in spite of being on Keflex for the last week, it sounds more consistent with worsening NPH ("wet, wacky, wobbly").  I consulted by phone with Dr. [name] with neurosurgery.  He explained that this is unlikely to require emergent intervention, but if she is unsafe and unsteady for discharge, he will evaluate the patient.  He recommended I order a shunt series to provide  additional information about the functioning and type of shunt.  I discussed this with the daughter and she agrees that she feels the patient is unsafe to go home and would appreciate additional evaluation.  Given the risk of the patient developing worsening symptoms leading to increased morbidity and mortality, I will consult the hospitalist for admission.  I also ordered the additional shunt series x-rays. [CF]  2130 Consulted by phone with Dr. Sidney Ace with the hospitalist service.  He will admit. [CF]    Clinical Course User Index [CF] Hinda Kehr, MD     Medications - No data to display   ED Discharge Orders     None      Final diagnoses:  Altered mental status, unspecified altered mental status type  Urinary incontinence, unspecified type  Imbalance  NPH (normal pressure hydrocephalus) (HCC)     Hinda Kehr, MD 07/25/22 [phone]

## 2022-07-25 NOTE — Assessment & Plan Note (Addendum)
Potassium will be replaced and magnesium level will be checked.

## 2022-07-25 NOTE — ED Notes (Signed)
Pt asking for oxygen. Pt helped to bed, nasal cannula applied. Pt was 84% on RA. Placed back on 2.5L, titrated to 3L then 94% SPO2 achieved. VS obtained.

## 2022-07-25 NOTE — Assessment & Plan Note (Signed)
-   We will continue BuSpar and Wellbutrin XL as well as Klonopin.

## 2022-07-25 NOTE — ED Notes (Signed)
IV does not draw back. Pt has small veins. Needs morning labs. Called lab to come draw.

## 2022-07-25 NOTE — ED Notes (Signed)
RN changed brief. Brief was dry. Pt denies feeling like she has to void.

## 2022-07-25 NOTE — ED Notes (Signed)
Dr Jimmye Norman at bedside. Lab at bedside to draw AM labs.

## 2022-07-26 ENCOUNTER — Inpatient Hospital Stay: Payer: Medicare PPO

## 2022-07-26 DIAGNOSIS — G912 (Idiopathic) normal pressure hydrocephalus: Secondary | ICD-10-CM | POA: Diagnosis not present

## 2022-07-26 DIAGNOSIS — D649 Anemia, unspecified: Secondary | ICD-10-CM | POA: Diagnosis not present

## 2022-07-26 DIAGNOSIS — F0390 Unspecified dementia without behavioral disturbance: Secondary | ICD-10-CM | POA: Diagnosis not present

## 2022-07-26 LAB — CBC
HCT: 30.3 % — ABNORMAL LOW (ref 36.0–46.0)
Hemoglobin: 9.5 g/dL — ABNORMAL LOW (ref 12.0–15.0)
MCH: 28.4 pg (ref 26.0–34.0)
MCHC: 31.4 g/dL (ref 30.0–36.0)
MCV: 90.7 fL (ref 80.0–100.0)
Platelets: 266 10*3/uL (ref 150–400)
RBC: 3.34 MIL/uL — ABNORMAL LOW (ref 3.87–5.11)
RDW: 13.6 % (ref 11.5–15.5)
WBC: 7.9 10*3/uL (ref 4.0–10.5)
nRBC: 0 % (ref 0.0–0.2)

## 2022-07-26 LAB — BASIC METABOLIC PANEL
Anion gap: 5 (ref 5–15)
BUN: 9 mg/dL (ref 8–23)
CO2: 29 mmol/L (ref 22–32)
Calcium: 8.7 mg/dL — ABNORMAL LOW (ref 8.9–10.3)
Chloride: 107 mmol/L (ref 98–111)
Creatinine, Ser: 0.56 mg/dL (ref 0.44–1.00)
GFR, Estimated: >60 mL/min (ref >60)
Glucose, Bld: 96 mg/dL (ref 70–99)
Potassium: 3.5 mmol/L (ref 3.5–5.1)
Sodium: 141 mmol/L (ref 135–145)

## 2022-07-26 LAB — GLUCOSE, CAPILLARY
Glucose-Capillary: 98 mg/dL (ref 70–99)
Glucose-Capillary: 223 mg/dL — ABNORMAL HIGH (ref 70–99)
Glucose-Capillary: 175 mg/dL — ABNORMAL HIGH (ref 70–99)
Glucose-Capillary: 175 mg/dL — ABNORMAL HIGH (ref 70–99)

## 2022-07-26 LAB — URINE CULTURE: Culture: NO GROWTH

## 2022-07-26 NOTE — TOC Initial Note (Signed)
Transition of Care [facility]) - Initial/Assessment Note    Patient Details  Name: Amanda Porter MRN: [id number] Date of Birth: [birth year]  Transition of Care [facility]) CM/SW Contact:    Valente David, RN Phone Number: 07/26/2022, 1:15 PM  Clinical Narrative:                  Spoke with daughter Amanda Porter, [place] on file.  Patient lives with daughter, she is patients full time care giver however she works 4 days a week.  Patient attends adult day care on Tuesdays, Thursdays, and Fridays, leaving patient home alone the 4th day.  She provide all transportation to MD appointments, PCP is Amanda Friendly, NP.  DME in the home include shower chair, oxygen concentrator, and rollator, daughter denies needing any additional equipment at this time.   Advised that recommendations were for HHPT/OT.  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  Patient is scheduled for repeat lumbar puncture on Monday, may need shunt revision. She would like to have repeat PT/OT evaluation after and determine if patient qualify for SNF or if her condition improves.  TOC team will continue to follow.   Expected Discharge Plan: [place] Barriers to Discharge: Continued Medical Work up   Patient Goals and CMS Choice Patient states their goals for this hospitalization and ongoing recovery are:: Home with home health CMS Medicare.gov Compare Post Acute Care list provided to:: Patient Represenative (must comment) Amanda Porter, daughter) Choice offered to / list presented to : Adult Children  Expected Discharge Plan and Services Expected Discharge Plan: [place] Choice: [place] arrangements for the past 2 months: Single Family Home                                       Prior Living Arrangements/Services Living arrangements for the past 2 months: Single Family Home Lives with:: Adult Children Patient language and need for interpreter reviewed:: Yes Do you feel safe going back to the place where you live?: Yes      Need for Family Participation in Patient Care: Yes (Comment) Care giver support system in place?: Yes (comment) Current home services: DME, Home OT, Home PT Criminal Activity/Legal Involvement Pertinent to Current Situation/Hospitalization: No - Comment as needed  Activities of Daily Living Home Assistive Devices/Equipment: Bedside commode/3-in-1, Cane (specify quad or straight), Shower chair with back, Walker (specify type) ADL Screening (condition at time of admission) Patient's cognitive ability adequate to safely complete daily activities?: Yes Is the patient deaf or have difficulty hearing?: No Does the patient have difficulty concentrating, remembering, or making decisions?: Yes Weakness of Legs: Left  Permission Sought/Granted Permission sought to share information with : Case Manager Permission granted to share information with : Yes, Verbal Permission Granted              Emotional Assessment       Orientation: : Oriented to Self, Oriented to Place   Psych Involvement: No (comment)  Admission diagnosis:  NPH (normal pressure hydrocephalus) ([place]) [G91.2] Imbalance [R26.89] Altered mental status, unspecified altered mental status type [R41.82] Urinary incontinence, unspecified type [R32] Patient Active Problem List   Diagnosis Date Noted  NPH (normal pressure hydrocephalus) ([place]) 07/25/2022   Recurrent UTI 06/24/2022   Recurrent falls 06/24/2022   DNR (do not resuscitate) 06/24/2022   S/P VP shunt 04/22/2022   Subdural hematoma ([place]) 04/16/2022   Pleural effusion on left 04/08/2022   Decreased urine output 04/04/2022   Vitamin D deficiency 04/04/2022   Other fatigue 04/04/2022   Hypokalemia 04/04/2022    Urinary retention 12/13/2021   Wrist fracture, closed, left, sequela 12/13/2021   Protein-calorie malnutrition, severe 11/07/2021   Tardive dyskinesia 02/01/2020   Cervical spondylolysis 05/24/2019   Chronic respiratory failure with hypoxia ([place]) 02/25/2019   Postural dizziness 08/11/2018   Iron deficiency anemia 07/02/2018   Anemia 04/12/2018   Type 2 diabetes mellitus with hyperglycemia ([place]) 02/18/2018   Pain of left lower extremity 11/26/2017   Memory disturbance 11/20/2017   Right carotid bruit 11/20/2017   Hyperlipidemia 09/14/2017   COPD mixed type ([place]) 09/14/2017   Dementia without behavioral disturbance ([place]) 09/14/2017   Normal pressure hydrocephalus ([place]) 09/14/2017   Dysphagia 09/14/2017   Essential hypertension 09/14/2017   History of CVA (cerebrovascular accident) 09/14/2017   Anxiety and depression 09/14/2017   Tobacco use disorder 12/10/2016   PCP:  Amanda Koch, NP Pharmacy:   [facility], [address] [place]. [place] [address] Phone: [phone] Fax: [phone]  [id number] - [place], [place] [address] [place] [address] Phone: [phone] Fax: [phone]     Social Determinants of Health (SDOH) Interventions    Readmission Risk Interventions     No data to display

## 2022-07-26 NOTE — Progress Notes (Signed)
PROGRESS NOTE    Amanda Porter  AVW:[id number] DOB: August 27, 1946 DOA: 07/24/2022 PCP: Pleas Koch, NP    Assessment & Plan:   Principal Problem:   NPH (normal pressure hydrocephalus) (HCC) Active Problems:   Hypokalemia   Dementia without behavioral disturbance (HCC)   Anxiety and depression   Hyperlipidemia  Assessment and Plan: NPH: presenting with acute metabolic encephalopathy, ataxia and urinary incontinence concerning about her worsening hydrocephalus & ventriculomegaly. Imaging showing intact ventriculoperitoneal shunt. S/p LP & PT eval on 07/25/22. Still very confused this morning. No shunt revision at this time as per neuro surg. Neuro surg following and recs apprec    Hypokalemia: WNL today    Dementia: continue on namenda. Haldol prn for agitation    Depression: severity unknown. Continue on home dose of buspar, wellbutrin   DM2: poorly controlled, HbA1c 7.2. Continue on SSI w/ accuchecks    Anxiety: severity unknown. Continue on home dose of klonopin   HLD: continue on statin   Normocytic anemia: H&H are labile. No need for a transfusion currently      DVT prophylaxis: lovenox  Code Status:  DNR Family Communication:  Disposition Plan: likely d/c back home w/ HH   Level of care: Telemetry Medical  Status is: Inpatient Remains inpatient appropriate because: severity of illness   Consultants:  Neuro surg   Procedures:   Antimicrobials:   Subjective: Pt is still pleasantly confused   Objective: Vitals:   07/25/22 2300 07/26/22 0209 07/26/22 0231 07/26/22 0646  BP: (!) 146/86 129/61 (!) 142/60 (!) 149/63  Pulse: 77 84 84 77  Resp: 18 18 18 16   Temp: 98.2 F (36.8 C) 98.5 F (36.9 C) 98.2 F (36.8 C) 98.3 F (36.8 C)  TempSrc: Oral Oral    SpO2: 100% 100% 100% 100%  Weight:        Intake/Output Summary (Last 24 hours) at 07/26/2022 0722 Last data filed at 07/25/2022 0915 Gross per 24 hour  Intake 240 ml  Output --  Net 240 ml    Filed Weights   07/24/22 1810  Weight: 49 kg    Examination:  General exam: Appears comfortable but confused. Female pattern facial hair distrubution   Respiratory system: clear breath sounds b/l Cardiovascular system: S1/S2+. No rubs or clicks  Gastrointestinal system: Abd is soft, NT, ND & hypoactive bowel sounds  Central nervous system: Awake but confused. Still oriented to person & place only   Psychiatry: Judgement and insight appears poor. Flat mood and affect     Data Reviewed: I have personally reviewed following labs and imaging studies  CBC: Recent Labs  Lab 07/24/22 1818 07/25/22 0923 07/26/22 0456  WBC 7.9 5.9 7.9  NEUTROABS 4.3  --   --   HGB 10.1* 9.8* 9.5*  HCT 33.7* 31.8* 30.3*  MCV 92.1 89.3 90.7  PLT 293 284 829   Basic Metabolic Panel: Recent Labs  Lab 07/24/22 1817 07/24/22 1818 07/25/22 0923 07/26/22 0456  NA  --  143 143 141  K  --  3.3* 4.2 3.5  CL  --  101 105 107  CO2  --  34* 31 29  GLUCOSE  --  97 147* 96  BUN  --  12 10 9   CREATININE  --  0.63 0.61 0.56  CALCIUM  --  9.6 8.9 8.7*  MG 2.0  --   --   --    GFR: Estimated Creatinine Clearance: 45.6 mL/min (by C-G formula based on SCr of 0.56  mg/dL). Liver Function Tests: Recent Labs  Lab 07/24/22 1818  AST 16  ALT 15  ALKPHOS 58  BILITOT 0.5  PROT 7.5  ALBUMIN 3.8   No results for input(s): "LIPASE", "AMYLASE" in the last 168 hours. No results for input(s): "AMMONIA" in the last 168 hours. Coagulation Profile: No results for input(s): "INR", "PROTIME" in the last 168 hours. Cardiac Enzymes: No results for input(s): "CKTOTAL", "CKMB", "CKMBINDEX", "TROPONINI" in the last 168 hours. BNP (last 3 results) Recent Labs    04/04/22 1025  PROBNP 13.0   HbA1C: No results for input(s): "HGBA1C" in the last 72 hours. CBG: Recent Labs  Lab 07/25/22 1623  GLUCAP 135*   Lipid Profile: No results for input(s): "CHOL", "HDL", "LDLCALC", "TRIG", "CHOLHDL", "LDLDIRECT" in the  last 72 hours. Thyroid Function Tests: No results for input(s): "TSH", "T4TOTAL", "FREET4", "T3FREE", "THYROIDAB" in the last 72 hours. Anemia Panel: No results for input(s): "VITAMINB12", "FOLATE", "FERRITIN", "TIBC", "IRON", "RETICCTPCT" in the last 72 hours. Sepsis Labs: No results for input(s): "PROCALCITON", "LATICACIDVEN" in the last 168 hours.  Recent Results (from the past 240 hour(s))  SARS Coronavirus 2 by RT PCR (hospital order, performed in [facility] hospital lab) *cepheid single result test* Anterior Nasal Swab     Status: None   Collection Time: 07/24/22  9:55 PM   Specimen: Anterior Nasal Swab  Result Value Ref Range Status   SARS Coronavirus 2 by RT PCR NEGATIVE NEGATIVE Final    Comment: (NOTE) SARS-CoV-2 target nucleic acids are NOT DETECTED.  The SARS-CoV-2 RNA is generally detectable in upper and lower respiratory specimens during the acute phase of infection. The lowest concentration of SARS-CoV-2 viral copies this assay can detect is 250 copies / mL. A negative result does not preclude SARS-CoV-2 infection and should not be used as the sole basis for treatment or other patient management decisions.  A negative result may occur with improper specimen collection / handling, submission of specimen other than nasopharyngeal swab, presence of viral mutation(s) within the areas targeted by this assay, and inadequate number of viral copies (<250 copies / mL). A negative result must be combined with clinical observations, patient history, and epidemiological information.  Fact Sheet for Patients:   https://www.patel.info/  Fact Sheet for Healthcare Providers: https://hall.com/  This test is not yet approved or  cleared by the [place] FDA and has been authorized for detection and/or diagnosis of SARS-CoV-2 by FDA under an Emergency Use Authorization (EUA).  This EUA will remain in effect (meaning this test can be  used) for the duration of the COVID-19 declaration under Section 564(b)(1) of the Act, 21 U.S.C. section 360bbb-3(b)(1), unless the authorization is terminated or revoked sooner.  Performed at [facility], [address] [place], [place] [address]   CSF culture w Gram Stain     Status: None (Preliminary result)   Collection Time: 07/25/22  2:54 PM   Specimen: PATH Cytology CSF; Cerebrospinal Fluid  Result Value Ref Range Status   Specimen Description CSF  Final   Special Requests NONE  Final   Gram Stain   Final    NO ORGANISMS SEEN NO WBC SEEN RED BLOOD CELLS PRESENT Performed at [facility], [address] [place], [place] [address]    Culture PENDING  Incomplete   Report Status PENDING  Incomplete         Radiology Studies: IR FL GUIDED LOC OF NEEDLE/CATH TIP FOR SPINAL INJECT RT  Result Date: 07/25/2022 CLINICAL DATA:  Normal pressure hydrocephalus, high volume lumbar puncture requested EXAM: DIAGNOSTIC LUMBAR PUNCTURE UNDER FLUOROSCOPIC GUIDANCE COMPARISON:  None Available. FLUOROSCOPY: Radiation Exposure Index (as provided by the fluoroscopic device): 0.5 minutes (5 mGy) PROCEDURE: Informed consent was obtained from the patient prior to the procedure, including potential complications of headache, allergy, and pain. With the patient lateral decubitus, the lower back was prepped with chlorhexidine. 1% Lidocaine was used for local anesthesia. Lumbar puncture was performed at the L4-L5 level using a 20 gauge spinal gauge needle with return of clear, colorless CSF with an opening pressure of 16 cm H2O. Approximately 30 ml of CSF was obtained. The patient tolerated the procedure well and there were no apparent complications. IMPRESSION: Successful high volume lumbar puncture using fluoroscopic guidance with removal of 30 mL clear, colorless CSF. Electronically Signed   By: Albin Felling M.D.   On: 07/25/2022 15:25   DG Cervical Spine 1 View  Result Date:  07/25/2022 CLINICAL DATA:  Shunt malfunction EXAM: DG CERVICAL SPINE - 1 VIEW; ABDOMEN - 1 VIEW; SKULL - 1-3 VIEW COMPARISON:  None Available. FINDINGS: Right frontal ventriculoperitoneal shunt is in place. The extracranial catheter appears intact along its cervical and abdominal course with the tip seen looped within the mid pelvis. The thoracic component seen on chest radiograph of 07/24/2022 and appears intact on that examination. Paranasal sinuses are clear. Normal abdominal gas pattern. Cholelithiasis noted. IMPRESSION: 1. Intact right frontal ventriculoperitoneal shunt. 2. Cholelithiasis. 3. Normal abdominal gas pattern. Electronically Signed   By: Fidela Salisbury M.D.   On: 07/25/2022 04:03   DG Skull 1-3 Views  Result Date: 07/25/2022 CLINICAL DATA:  Shunt malfunction EXAM: DG CERVICAL SPINE - 1 VIEW; ABDOMEN - 1 VIEW; SKULL - 1-3 VIEW COMPARISON:  None Available. FINDINGS: Right frontal ventriculoperitoneal shunt is in place. The extracranial catheter appears intact along its cervical and abdominal course with the tip seen looped within the mid pelvis. The thoracic component seen on chest radiograph of 07/24/2022 and appears intact on that examination. Paranasal sinuses are clear. Normal abdominal gas pattern. Cholelithiasis noted. IMPRESSION: 1. Intact right frontal ventriculoperitoneal shunt. 2. Cholelithiasis. 3. Normal abdominal gas pattern. Electronically Signed   By: Fidela Salisbury M.D.   On: 07/25/2022 04:03   DG Abd 1 View  Result Date: 07/25/2022 CLINICAL DATA:  Shunt malfunction EXAM: DG CERVICAL SPINE - 1 VIEW; ABDOMEN - 1 VIEW; SKULL - 1-3 VIEW COMPARISON:  None Available. FINDINGS: Right frontal ventriculoperitoneal shunt is in place. The extracranial catheter appears intact along its cervical and abdominal course with the tip seen looped within the mid pelvis. The thoracic component seen on chest radiograph of 07/24/2022 and appears intact on that examination. Paranasal sinuses are  clear. Normal abdominal gas pattern. Cholelithiasis noted. IMPRESSION: 1. Intact right frontal ventriculoperitoneal shunt. 2. Cholelithiasis. 3. Normal abdominal gas pattern. Electronically Signed   By: Fidela Salisbury M.D.   On: 07/25/2022 04:03   CT HEAD WO CONTRAST (5MM)  Result Date: 07/25/2022 CLINICAL DATA:  Mental status change, unknown cause EXAM: CT HEAD WITHOUT CONTRAST TECHNIQUE: Contiguous axial images were obtained from the base of the skull through the vertex without intravenous contrast. RADIATION DOSE REDUCTION: This exam was performed according to the departmental dose-optimization program which includes automated exposure control, adjustment of the mA and/or kV according to patient size and/or use of iterative reconstruction technique. COMPARISON:  04/17/2022 FINDINGS: Brain: Mild parenchymal volume loss is commensurate with the patient's age and appears stable since prior examination. Extensive subcortical and periventricular  white matter changes are present likely reflecting the sequela of small vessel ischemia. Right frontal ventriculostomy is unchanged with its tip within the anterior horn of the right lateral ventricle abutting the septum flu some. There is progressive ventriculomegaly when compared to prior examination. Remote infarct noted within the right thalamus. No acute intracranial hemorrhage or infarct. No abnormal mass effect or midline shift. No abnormal intra or extra-axial mass lesion. Cerebellum unremarkable. Vascular: Extensive vascular calcifications within the carotid siphons. No asymmetric hyperdense vasculature at the skull base. Skull: Normal. Negative for fracture or focal lesion. Sinuses/Orbits: No acute finding. Other: Mastoid air cells and middle ear cavities are clear. IMPRESSION: 1. Progressive ventriculomegaly when compared to prior examination of 04/17/2022. Stable right frontal ventriculostomy catheter. 2. Stable advanced senescent changes and remote right  thalamic infarct. 3. No acute intracranial hemorrhage or infarct. Electronically Signed   By: Fidela Salisbury M.D.   On: 07/25/2022 01:57   DG Chest Portable 1 View  Result Date: 07/25/2022 CLINICAL DATA:  Altered mental status. EXAM: PORTABLE CHEST 1 VIEW COMPARISON:  Chest radiograph dated 04/16/2022. FINDINGS: Minimal bibasilar atelectasis/scarring. No focal consolidation, pleural effusion or pneumothorax. Background of emphysema. Similar appearance of soft tissue density and associated surgical clips in the left suprahilar/periaortic region. No acute osseous pathology. Shunt tube noted over the right side of the chest. IMPRESSION: 1. No acute cardiopulmonary process. 2. Emphysema. Electronically Signed   By: Anner Crete M.D.   On: 07/25/2022 01:39   DG ELBOW COMPLETE LEFT (3+VIEW)  Result Date: 07/25/2022 CLINICAL DATA:  [place] 1 week ago with continued left elbow pain. EXAM: LEFT ELBOW - COMPLETE 3+ VIEW COMPARISON:  None Available. FINDINGS: There is no evidence of fracture, dislocation, or joint effusion. There is no evidence of arthropathy or other focal bone abnormality. Soft tissues are unremarkable. IMPRESSION: Negative. Electronically Signed   By: Telford Nab M.D.   On: 07/25/2022 01:38        Scheduled Meds:  arformoterol  15 mcg Nebulization BID   And   umeclidinium bromide  1 puff Inhalation Daily   buPROPion  300 mg Oral Q breakfast   busPIRone  45 mg Oral Daily   And   busPIRone  30 mg Oral QHS   clonazePAM  1 mg Oral QHS   cyanocobalamin   Oral Daily   enoxaparin (LOVENOX) injection  40 mg Subcutaneous J44B   folic acid  1 mg Oral Daily   insulin aspart  0-6 Units Subcutaneous TID WC   memantine  5 mg Oral BID   rosuvastatin  20 mg Oral QPM   tamsulosin  0.4 mg Oral Daily   Continuous Infusions:     LOS: 1 day    Time spent: 36 mins    Wyvonnia Dusky, MD [facility] Pager [phone]  If 7PM-7AM, please contact  night-coverage [email or web address] 07/26/2022, 7:22 AM

## 2022-07-26 NOTE — Progress Notes (Signed)
Post-LP PT evaluation performed.  [place] from 19-21.  Based on this information, shunt revision is likely not going to be worthwhile.  Would continue PTOT and other care per primary service.

## 2022-07-27 DIAGNOSIS — G912 (Idiopathic) normal pressure hydrocephalus: Secondary | ICD-10-CM | POA: Diagnosis not present

## 2022-07-27 DIAGNOSIS — F0390 Unspecified dementia without behavioral disturbance: Secondary | ICD-10-CM | POA: Diagnosis not present

## 2022-07-27 DIAGNOSIS — E876 Hypokalemia: Secondary | ICD-10-CM | POA: Diagnosis not present

## 2022-07-27 LAB — GLUCOSE, CAPILLARY
Glucose-Capillary: 151 mg/dL — ABNORMAL HIGH (ref 70–99)
Glucose-Capillary: 186 mg/dL — ABNORMAL HIGH (ref 70–99)
Glucose-Capillary: 121 mg/dL — ABNORMAL HIGH (ref 70–99)
Glucose-Capillary: 207 mg/dL — ABNORMAL HIGH (ref 70–99)

## 2022-07-27 LAB — CBC
HCT: 30.5 % — ABNORMAL LOW (ref 36.0–46.0)
Hemoglobin: 9.6 g/dL — ABNORMAL LOW (ref 12.0–15.0)
MCH: 28.3 pg (ref 26.0–34.0)
MCHC: 31.5 g/dL (ref 30.0–36.0)
MCV: 90 fL (ref 80.0–100.0)
Platelets: 286 10*3/uL (ref 150–400)
RBC: 3.39 MIL/uL — ABNORMAL LOW (ref 3.87–5.11)
RDW: 13.7 % (ref 11.5–15.5)
WBC: 7.2 10*3/uL (ref 4.0–10.5)
nRBC: 0 % (ref 0.0–0.2)

## 2022-07-27 LAB — BASIC METABOLIC PANEL
Anion gap: 6 (ref 5–15)
BUN: 11 mg/dL (ref 8–23)
CO2: 30 mmol/L (ref 22–32)
Calcium: 9.1 mg/dL (ref 8.9–10.3)
Chloride: 107 mmol/L (ref 98–111)
Creatinine, Ser: 1.08 mg/dL — ABNORMAL HIGH (ref 0.44–1.00)
GFR, Estimated: 53 mL/min — ABNORMAL LOW (ref >60)
Glucose, Bld: 134 mg/dL — ABNORMAL HIGH (ref 70–99)
Potassium: 3.4 mmol/L — ABNORMAL LOW (ref 3.5–5.1)
Sodium: 143 mmol/L (ref 135–145)

## 2022-07-27 MED ORDER — POTASSIUM CHLORIDE CRYS ER 10 MEQ PO TBCR
10.0000 meq | EXTENDED_RELEASE_TABLET | Freq: Once | ORAL | Status: AC
  Administered 2022-07-27: 10 meq via ORAL
  Filled 2022-07-27: qty 1

## 2022-07-27 NOTE — Progress Notes (Signed)
PROGRESS NOTE    Amanda Porter  EPP:[id number] DOB: 17-Jul-1946 DOA: 07/24/2022 PCP: Pleas Koch, NP    Assessment & Plan:   Principal Problem:   NPH (normal pressure hydrocephalus) (HCC) Active Problems:   Hypokalemia   Dementia without behavioral disturbance (HCC)   Anxiety and depression   Hyperlipidemia  Assessment and Plan: NPH: presenting with acute metabolic encephalopathy, ataxia and urinary incontinence concerning about her worsening hydrocephalus & ventriculomegaly. Imaging showing intact ventriculoperitoneal shunt. S/p LP & PT eval on 07/25/22. Oriented to person and place only. No shunt revision at this time as per neuro surg. Neuro surg following and recs apprec   Hypokalemia: potassium given    Dementia: continue on namenda. Haldol prn for agitation    Depression: severity unknown. Continue on home dose of wellbutrin, buspar  DM2: HbA1c 7.2, poorly controlled. Continue on SSI w/ accuchecks  Anxiety: severity unknown. Continue on home dose of klonopin   HLD: continue on statin   Normocytic anemia: H&H are stable. No need for a transfusion      DVT prophylaxis: lovenox  Code Status:  DNR Family Communication:  discussed pt's care w/ pt's daughter, [name] and answered her questions  Disposition Plan: likely d/c back home w/ HH   Level of care: Med-Surg  Status is: Inpatient Remains inpatient appropriate because: severity of illness   Consultants:  Neuro surg   Procedures:   Antimicrobials:   Subjective: Pt is still pleasantly confused   Objective: Vitals:   07/26/22 1955 07/26/22 2031 07/27/22 0414 07/27/22 0723  BP:  (!) 155/79 (!) 104/54 120/63  Pulse:  89 84 87  Resp:  19 19 17   Temp:    98.6 F (37 C)  TempSrc:      SpO2: 96% 100% 97% 97%  Weight:       No intake or output data in the 24 hours ending 07/27/22 0726  Filed Weights   07/24/22 1810  Weight: 49 kg    Examination:  General exam: Appears confused. Female facial  hair distrubution pattern   Respiratory system: decreased breath sounds b/l  Cardiovascular system: S1 & S2+. No rubs or gallops  Gastrointestinal system: Abd is soft, NT, ND & normal bowel sounds Central nervous system: Awake and alert. Oriented to person & place only   Psychiatry: judgement and insight appears poor. Flat mood and affect      Data Reviewed: I have personally reviewed following labs and imaging studies  CBC: Recent Labs  Lab 07/24/22 1818 07/25/22 0923 07/26/22 0456 07/27/22 0511  WBC 7.9 5.9 7.9 7.2  NEUTROABS 4.3  --   --   --   HGB 10.1* 9.8* 9.5* 9.6*  HCT 33.7* 31.8* 30.3* 30.5*  MCV 92.1 89.3 90.7 90.0  PLT 293 284 266 606   Basic Metabolic Panel: Recent Labs  Lab 07/24/22 1817 07/24/22 1818 07/25/22 0923 07/26/22 0456 07/27/22 0511  NA  --  143 143 141 143  K  --  3.3* 4.2 3.5 3.4*  CL  --  101 105 107 107  CO2  --  34* 31 29 30   GLUCOSE  --  97 147* 96 134*  BUN  --  12 10 9 11   CREATININE  --  0.63 0.61 0.56 1.08*  CALCIUM  --  9.6 8.9 8.7* 9.1  MG 2.0  --   --   --   --    GFR: Estimated Creatinine Clearance: 33.7 mL/min (A) (by C-G formula based on SCr  of 1.08 mg/dL (H)). Liver Function Tests: Recent Labs  Lab 07/24/22 1818  AST 16  ALT 15  ALKPHOS 58  BILITOT 0.5  PROT 7.5  ALBUMIN 3.8   No results for input(s): "LIPASE", "AMYLASE" in the last 168 hours. No results for input(s): "AMMONIA" in the last 168 hours. Coagulation Profile: No results for input(s): "INR", "PROTIME" in the last 168 hours. Cardiac Enzymes: No results for input(s): "CKTOTAL", "CKMB", "CKMBINDEX", "TROPONINI" in the last 168 hours. BNP (last 3 results) Recent Labs    04/04/22 1025  PROBNP 13.0   HbA1C: No results for input(s): "HGBA1C" in the last 72 hours. CBG: Recent Labs  Lab 07/25/22 1623 07/26/22 0733 07/26/22 1142 07/26/22 1620 07/26/22 2028  GLUCAP 135* 98 223* 175* 175*   Lipid Profile: No results for input(s): "CHOL", "HDL",  "LDLCALC", "TRIG", "CHOLHDL", "LDLDIRECT" in the last 72 hours. Thyroid Function Tests: No results for input(s): "TSH", "T4TOTAL", "FREET4", "T3FREE", "THYROIDAB" in the last 72 hours. Anemia Panel: No results for input(s): "VITAMINB12", "FOLATE", "FERRITIN", "TIBC", "IRON", "RETICCTPCT" in the last 72 hours. Sepsis Labs: No results for input(s): "PROCALCITON", "LATICACIDVEN" in the last 168 hours.  Recent Results (from the past 240 hour(s))  Urine Culture     Status: None   Collection Time: 07/24/22  9:18 PM   Specimen: Urine, Clean Catch  Result Value Ref Range Status   Specimen Description   Final    URINE, CLEAN CATCH Performed at [facility], [address] [place], [place] [address]    Special Requests   Final    NONE Performed at [facility], [address], [place], [place] [address]    Culture   Final    NO GROWTH Performed at [facility], [place] [address] [place], [place] [address]    Report Status 07/26/2022 FINAL  Final  SARS Coronavirus 2 by RT PCR (hospital order, performed in [facility] hospital lab) *cepheid single result test* Anterior Nasal Swab     Status: None   Collection Time: 07/24/22  9:55 PM   Specimen: Anterior Nasal Swab  Result Value Ref Range Status   SARS Coronavirus 2 by RT PCR NEGATIVE NEGATIVE Final    Comment: (NOTE) SARS-CoV-2 target nucleic acids are NOT DETECTED.  The SARS-CoV-2 RNA is generally detectable in upper and lower respiratory specimens during the acute phase of infection. The lowest concentration of SARS-CoV-2 viral copies this assay can detect is 250 copies / mL. A negative result does not preclude SARS-CoV-2 infection and should not be used as the sole basis for treatment or other patient management decisions.  A negative result may occur with improper specimen collection / handling, submission of specimen other than nasopharyngeal swab, presence of viral mutation(s) within the areas targeted by  this assay, and inadequate number of viral copies (<250 copies / mL). A negative result must be combined with clinical observations, patient history, and epidemiological information.  Fact Sheet for Patients:   https://www.patel.info/  Fact Sheet for Healthcare Providers: https://hall.com/  This test is not yet approved or  cleared by the [place] FDA and has been authorized for detection and/or diagnosis of SARS-CoV-2 by FDA under an Emergency Use Authorization (EUA).  This EUA will remain in effect (meaning this test can be used) for the duration of the COVID-19 declaration under Section 564(b)(1) of the Act, 21 U.S.C. section 360bbb-3(b)(1), unless the authorization is terminated or revoked sooner.  Performed at [facility], [address] [place],  [place] [address]   CSF culture w Gram Stain     Status: None (Preliminary result)   Collection Time: 07/25/22  2:54 PM   Specimen: PATH Cytology CSF; Cerebrospinal Fluid  Result Value Ref Range Status   Specimen Description   Final    CSF Performed at [facility], [address] [place], [place] [address]    Special Requests   Final    NONE Performed at [facility], [address] [place], [place] [address]    Gram Stain   Final    NO ORGANISMS SEEN NO WBC SEEN RED BLOOD CELLS PRESENT Performed at [facility], [address], [place], [place] [address]    Culture   Final    NO GROWTH < 24 HOURS Performed at [facility], [place] [address] [place], [place] [address]    Report Status PENDING  Incomplete         Radiology Studies: DG Shoulder Left  Result Date: 07/26/2022 CLINICAL DATA:  Left shoulder pain EXAM: LEFT SHOULDER - 3 VIEW COMPARISON:  None Available. FINDINGS: No evidence of fracture or dislocation. Mild degenerative changes of the glenohumeral joint. Surgical clips overlying the superior right mediastinum.  Soft tissues are unremarkable. IMPRESSION: No acute osseous abnormality. Electronically Signed   By: Yetta Glassman M.D.   On: 07/26/2022 20:52   IR FL GUIDED LOC OF NEEDLE/CATH TIP FOR SPINAL INJECT RT  Result Date: 07/25/2022 CLINICAL DATA:  Normal pressure hydrocephalus, high volume lumbar puncture requested EXAM: DIAGNOSTIC LUMBAR PUNCTURE UNDER FLUOROSCOPIC GUIDANCE COMPARISON:  None Available. FLUOROSCOPY: Radiation Exposure Index (as provided by the fluoroscopic device): 0.5 minutes (5 mGy) PROCEDURE: Informed consent was obtained from the patient prior to the procedure, including potential complications of headache, allergy, and pain. With the patient lateral decubitus, the lower back was prepped with chlorhexidine. 1% Lidocaine was used for local anesthesia. Lumbar puncture was performed at the L4-L5 level using a 20 gauge spinal gauge needle with return of clear, colorless CSF with an opening pressure of 16 cm H2O. Approximately 30 ml of CSF was obtained. The patient tolerated the procedure well and there were no apparent complications. IMPRESSION: Successful high volume lumbar puncture using fluoroscopic guidance with removal of 30 mL clear, colorless CSF. Electronically Signed   By: Albin Felling M.D.   On: 07/25/2022 15:25        Scheduled Meds:  arformoterol  15 mcg Nebulization BID   And   umeclidinium bromide  1 puff Inhalation Daily   buPROPion  300 mg Oral Q breakfast   busPIRone  45 mg Oral Daily   And   busPIRone  30 mg Oral QHS   clonazePAM  1 mg Oral QHS   cyanocobalamin   Oral Daily   enoxaparin (LOVENOX) injection  40 mg Subcutaneous Y30Z   folic acid  1 mg Oral Daily   insulin aspart  0-6 Units Subcutaneous TID WC   memantine  5 mg Oral BID   rosuvastatin  20 mg Oral QPM   tamsulosin  0.4 mg Oral Daily   Continuous Infusions:     LOS: 2 days    Time spent: 25 mins    Wyvonnia Dusky, MD [facility] Pager [phone]  If 7PM-7AM, please  contact night-coverage [email or web address] 07/27/2022, 7:26 AM

## 2022-07-27 NOTE — Progress Notes (Signed)
Mobility Specialist - Progress Note    07/27/22 1500  Mobility  Activity Ambulated with assistance in hallway  Level of Assistance Contact guard assist, steadying assist  Assistive Device Front wheel walker  Distance Ambulated (ft) 60 ft  Activity Response Tolerated well  $Mobility charge 1 Mobility    Pt sitting in bed upon arrival using 3L. Completes bed mobility ModI, STS with supervision and ambulates CGA/MinA -- several instances of drifting, RW steering needed to prevent colliding into wall. Pt is left with alarm set and needs in reach.  Merrily Brittle Mobility Specialist 07/27/22, 3:14 PM

## 2022-07-28 ENCOUNTER — Ambulatory Visit: Payer: Medicare PPO | Admitting: Internal Medicine

## 2022-07-28 ENCOUNTER — Inpatient Hospital Stay: Payer: Medicare PPO

## 2022-07-28 DIAGNOSIS — F0390 Unspecified dementia without behavioral disturbance: Secondary | ICD-10-CM | POA: Diagnosis not present

## 2022-07-28 DIAGNOSIS — E876 Hypokalemia: Secondary | ICD-10-CM | POA: Diagnosis not present

## 2022-07-28 DIAGNOSIS — G912 (Idiopathic) normal pressure hydrocephalus: Secondary | ICD-10-CM | POA: Diagnosis not present

## 2022-07-28 LAB — BASIC METABOLIC PANEL
Anion gap: 8 (ref 5–15)
BUN: 21 mg/dL (ref 8–23)
CO2: 28 mmol/L (ref 22–32)
Calcium: 9.2 mg/dL (ref 8.9–10.3)
Chloride: 106 mmol/L (ref 98–111)
Creatinine, Ser: 0.82 mg/dL (ref 0.44–1.00)
GFR, Estimated: >60 mL/min (ref >60)
Glucose, Bld: 123 mg/dL — ABNORMAL HIGH (ref 70–99)
Potassium: 3.4 mmol/L — ABNORMAL LOW (ref 3.5–5.1)
Sodium: 142 mmol/L (ref 135–145)

## 2022-07-28 LAB — CBC
HCT: 30.6 % — ABNORMAL LOW (ref 36.0–46.0)
Hemoglobin: 9.4 g/dL — ABNORMAL LOW (ref 12.0–15.0)
MCH: 27.6 pg (ref 26.0–34.0)
MCHC: 30.7 g/dL (ref 30.0–36.0)
MCV: 90 fL (ref 80.0–100.0)
Platelets: 281 10*3/uL (ref 150–400)
RBC: 3.4 MIL/uL — ABNORMAL LOW (ref 3.87–5.11)
RDW: 13.9 % (ref 11.5–15.5)
WBC: 9.2 10*3/uL (ref 4.0–10.5)
nRBC: 0 % (ref 0.0–0.2)

## 2022-07-28 LAB — GLUCOSE, CAPILLARY
Glucose-Capillary: 125 mg/dL — ABNORMAL HIGH (ref 70–99)
Glucose-Capillary: 210 mg/dL — ABNORMAL HIGH (ref 70–99)
Glucose-Capillary: 137 mg/dL — ABNORMAL HIGH (ref 70–99)
Glucose-Capillary: 148 mg/dL — ABNORMAL HIGH (ref 70–99)

## 2022-07-28 LAB — CSF CULTURE W GRAM STAIN
Culture: NO GROWTH
Gram Stain: NONE SEEN

## 2022-07-28 MED ORDER — LIDOCAINE HCL (PF) 1 % IJ SOLN
10.0000 mL | Freq: Once | INTRAMUSCULAR | Status: AC
  Administered 2022-07-28: 4 mL via INTRADERMAL
  Administered 2022-07-29: 3 mL via INTRADERMAL
  Filled 2022-07-28: qty 10

## 2022-07-28 MED ORDER — POTASSIUM CHLORIDE CRYS ER 20 MEQ PO TBCR
20.0000 meq | EXTENDED_RELEASE_TABLET | Freq: Once | ORAL | Status: AC
  Administered 2022-07-28: 20 meq via ORAL
  Filled 2022-07-28: qty 1

## 2022-07-28 NOTE — Care Management Important Message (Signed)
Important Message  Patient Details  Name: [name] MRN: [id number] Date of Birth: [birth year]   Medicare Important Message Given:  Yes     Dannette Barbara 07/28/2022, 12:22 PM

## 2022-07-28 NOTE — TOC Progression Note (Signed)
Transition of Care [facility]) - Progression Note    Patient Details  Name: Amanda Porter MRN: [id number] Date of Birth: 11-11-1946  [facility] CM/SW Contact  Laurena Slimmer, RN Phone Number: 07/28/2022, 4:35 PM  Clinical Narrative:    Spoke with patient's daughter to advise of therapy recommendation. Patient duaghter advised the recommendation is for HHPT. [name] stated the patient has a tendency to do betterwhile she is inpatient then gets worse at home. Advised insurance may not be authorized and if not stay would be private pay. [name] stated the patient could not afford this. Informed about option to appeak once discharge is entered.    Expected Discharge Plan: [place] Barriers to Discharge: Continued Medical Work up  Expected Discharge Plan and Services Expected Discharge Plan: [place] Choice: [place] arrangements for the past 2 months: Single Family Home                                       Social Determinants of Health (SDOH) Interventions    Readmission Risk Interventions     No data to display

## 2022-07-28 NOTE — Plan of Care (Signed)

## 2022-07-28 NOTE — Progress Notes (Signed)
Physical Therapy Treatment Patient Details Name: Amanda Porter MRN: [id number] DOB: [birth year] Today's Date: 07/28/2022   History of Present Illness Pt is a 76 yo female that presented to the ED for chief comlplaint of AMS, agitation, urinary incontinence, 1 fall, unsteady gait. Per neurosurgery during admission plan is to perform high-volume lumbar punture. PMH includes CVA, COPD, s/p VP shunt, dementia, TD, and DM.    PT Comments    Patient alert, oriented to self, does report "hospital" in [place], unable to provide the date, stated 1924 as the year. Safety awareness appeared to be similar to last session, pt a bit more slow to process and initiate tasks but reports being sleepy.   She was able to perform transfers with RW and supervision, bed mobility modI. She ambulated ~75ft with veering and difficulty navigating obstacles to her RIGHT today. She was able to adjust with extra time. Tinetti performed in anticipation of potential lumbar tap later today, see details below. The patient would benefit from further skilled PT intervention to continue to progress towards goals. Recommendation remains appropriate.   TINETTI BALANCE ASSESSMENT TOOL BALANCE SECTION Sitting Balance:     1/1  Rises from Chair:   1/2   Attempts to Rise:   1/2  Standing Balance (1st 5 seconds) 1/2  Standing balance:   1/2 Nudged:    1/2 Eyes Closed:    1/1 Turning 360 degrees  0/1      1/1 Sitting down (getting seated):    0/2  Balance Score:    8/16  GAIT SECTION Indication of Gait:   1/1 Step Length and Height  1/1                                                       1/1 Foot Clearance:   1/1                                                       1/1 Step Symmetry:   1/1 Step Continuity:   1/1 Path:     0/2 Trunk:     2/2 Walking Time:   1/1 Gait Score:    10/12  Combined Score:   18/28      Recommendations for follow up therapy are one component of a multi-disciplinary discharge planning  process, led by the attending physician.  Recommendations may be updated based on patient status, additional functional criteria and insurance authorization.  Follow Up Recommendations  Home health PT     Assistance Recommended at Discharge Intermittent Supervision/Assistance  Patient can return home with the following A little help with bathing/dressing/bathroom;Assistance with cooking/housework;Assist for transportation;Help with stairs or ramp for entrance;Direct supervision/assist for medications management;A little help with walking and/or transfers   Equipment Recommendations  None recommended by PT    Recommendations for Other Services       Precautions / Restrictions Precautions Precautions: Fall Restrictions Weight Bearing Restrictions: No     Mobility  Bed Mobility Overal bed mobility: Modified Independent                  Transfers Overall transfer level: Needs assistance Equipment used: Rolling walker (2 wheels)  Transfers: Sit to/from Stand Sit to Stand: Supervision                Ambulation/Gait Ambulation/Gait assistance: Min guard Gait Distance (Feet): 80 Feet Assistive device: Rolling walker (2 wheels)         General Gait Details: step through gait with reciprocal gait pattern, but did tend to have gait path deviations/drives RW to the R, compared to driving to the L last session. able to navigate obstacles with time   Stairs             Wheelchair Mobility    Modified Rankin (Stroke Patients Only)       Balance Overall balance assessment: Needs assistance Sitting-balance support: Feet supported Sitting balance-Leahy Scale: Good       Standing balance-Leahy Scale: Fair Standing balance comment: able to statically stand                            Cognition Arousal/Alertness: Awake/alert Behavior During Therapy: WFL for tasks assessed/performed Overall Cognitive Status: No family/caregiver present to  determine baseline cognitive functioning                                 General Comments: pt oriented to self, "hospital" but says [name]. Cannot state the date, reported 70 for year        Exercises      General Comments        Pertinent Vitals/Pain Pain Assessment Pain Assessment: No/denies pain    Home Living                          Prior Function            PT Goals (current goals can now be found in the care plan section) Progress towards PT goals: Progressing toward goals    Frequency    Min 2X/week      PT Plan Current plan remains appropriate    Co-evaluation              AM-PAC PT "6 Clicks" Mobility   Outcome Measure  Help needed turning from your back to your side while in a flat bed without using bedrails?: None Help needed moving from lying on your back to sitting on the side of a flat bed without using bedrails?: None Help needed moving to and from a bed to a chair (including a wheelchair)?: None Help needed standing up from a chair using your arms (e.g., wheelchair or bedside chair)?: None Help needed to walk in hospital room?: A Little Help needed climbing 3-5 steps with a railing? : A Little 6 Click Score: 22    End of Session Equipment Utilized During Treatment: Gait belt Activity Tolerance: Patient tolerated treatment well Patient left: in bed;with call bell/phone within reach;with bed alarm set Nurse Communication: Mobility status PT Visit Diagnosis: Other abnormalities of gait and mobility (R26.89)     Time: 6269-4854 PT Time Calculation (min) (ACUTE ONLY): 20 min  Charges:  $Therapeutic Activity: 8-22 mins                    Lieutenant Diego PT, DPT 9:32 AM,07/28/22

## 2022-07-28 NOTE — Progress Notes (Signed)
       CROSS COVER NOTE  NAME: Amanda Porter MRN: [id number] DOB : 1946/07/22   Paged requesting patient's code status be clarified. Per nursing there is a discrepancy between code status in chart, MD note, and daughter statements at bedside. Staff report daughter indicated patient is DNR at bedside today.  I called and spoke with Amanda Porter, daughter at [phone]. Daughter is very health literate and stated that DNR had been rescinded while dealing with possible VP shunt issue, patient is FULL CODE as indicated in chart. Unclear what conversation nursing had with Daughter today that gave them a different impression.    This document was prepared using Dragon voice recognition software and may include unintentional dictation errors.  Neomia Glass DNP, MHA, FNP-BC Nurse Practitioner [facility] Pager [phone]

## 2022-07-28 NOTE — Progress Notes (Signed)
PROGRESS NOTE    Amanda Porter  [id number]:[id number] DOB: 06/12/1946 DOA: 07/24/2022 PCP: Pleas Koch, NP    Assessment & Plan:   Principal Problem:   NPH (normal pressure hydrocephalus) (HCC) Active Problems:   Hypokalemia   Dementia without behavioral disturbance (HCC)   Anxiety and depression   Hyperlipidemia  Assessment and Plan: NPH: presenting with acute metabolic encephalopathy, ataxia and urinary incontinence concerning about her worsening hydrocephalus & ventriculomegaly. Imaging showing intact ventriculoperitoneal shunt. S/p LP & PT eval on 07/25/22. Oriented to person and place only. No shunt revision at this time as per neuro surg. Neuro surg following and recs apprec    Hypokalemia: KCl repleated   Dementia: continue on namenda. Haldol prn for agitation    Depression: severity unknown. Continue on home dose of buspar, wellbutrin   DM2: poorly controlled, HbA1c 7.2. Continue on SSI w/ accuchecks   Anxiety: severity unknown. Continue on home dose of klonopin   HLD: continue on statin   Normocytic anemia: H&H are stable. Will transfuse if Hb < 7.0       DVT prophylaxis: lovenox  Code Status:  DNR Family Communication:   Disposition Plan: likely d/c back home w/ HH   Level of care: Med-Surg  Status is: Inpatient Remains inpatient appropriate because: severity of illness   Consultants:  Neuro surg   Procedures:   Antimicrobials:   Subjective: Pt c/o poor memory  Objective: Vitals:   07/27/22 1538 07/27/22 2035 07/27/22 2053 07/28/22 0445  BP: (!) 109/59  (!) 108/39 (!) 118/51  Pulse: (!) 109  91 86  Resp: 16  17 17   Temp: 98.3 F (36.8 C)  98.4 F (36.9 C) 98.1 F (36.7 C)  TempSrc: Oral  Oral Oral  SpO2: 96% 98% 98% 93%  Weight:        Intake/Output Summary (Last 24 hours) at 07/28/2022 0729 Last data filed at 07/27/2022 2010 Gross per 24 hour  Intake 120 ml  Output --  Net 120 ml    Filed Weights   07/24/22 1810  Weight: 49 kg     Examination:  General exam: Appears calm but confused. Female facial hair distrubution pattern   Respiratory system: diminished breath sounds b/l otherwise clear Cardiovascular system: S1/S2+. No rubs or clicks  Gastrointestinal system: abd is soft, NT, ND & normal bowel sounds Central nervous system: Awake and alert. Moves all extremities  Psychiatry: judgement and insight appears poor. Flat mood and affect    Data Reviewed: I have personally reviewed following labs and imaging studies  CBC: Recent Labs  Lab 07/24/22 1818 07/25/22 0923 07/26/22 0456 07/27/22 0511 07/28/22 0410  WBC 7.9 5.9 7.9 7.2 9.2  NEUTROABS 4.3  --   --   --   --   HGB 10.1* 9.8* 9.5* 9.6* 9.4*  HCT 33.7* 31.8* 30.3* 30.5* 30.6*  MCV 92.1 89.3 90.7 90.0 90.0  PLT 293 284 266 286 505   Basic Metabolic Panel: Recent Labs  Lab 07/24/22 1817 07/24/22 1818 07/25/22 0923 07/26/22 0456 07/27/22 0511 07/28/22 0410  NA  --  143 143 141 143 142  K  --  3.3* 4.2 3.5 3.4* 3.4*  CL  --  101 105 107 107 106  CO2  --  34* 31 29 30 28   GLUCOSE  --  97 147* 96 134* 123*  BUN  --  12 10 9 11 21   CREATININE  --  0.63 0.61 0.56 1.08* 0.82  CALCIUM  --  9.6  8.9 8.7* 9.1 9.2  MG 2.0  --   --   --   --   --    GFR: Estimated Creatinine Clearance: 44.4 mL/min (by C-G formula based on SCr of 0.82 mg/dL). Liver Function Tests: Recent Labs  Lab 07/24/22 1818  AST 16  ALT 15  ALKPHOS 58  BILITOT 0.5  PROT 7.5  ALBUMIN 3.8   No results for input(s): "LIPASE", "AMYLASE" in the last 168 hours. No results for input(s): "AMMONIA" in the last 168 hours. Coagulation Profile: No results for input(s): "INR", "PROTIME" in the last 168 hours. Cardiac Enzymes: No results for input(s): "CKTOTAL", "CKMB", "CKMBINDEX", "TROPONINI" in the last 168 hours. BNP (last 3 results) Recent Labs    04/04/22 1025  PROBNP 13.0   HbA1C: No results for input(s): "HGBA1C" in the last 72 hours. CBG: Recent Labs  Lab  07/26/22 2028 07/27/22 0726 07/27/22 1145 07/27/22 1643 07/27/22 2056  GLUCAP 175* 151* 186* 121* 207*   Lipid Profile: No results for input(s): "CHOL", "HDL", "LDLCALC", "TRIG", "CHOLHDL", "LDLDIRECT" in the last 72 hours. Thyroid Function Tests: No results for input(s): "TSH", "T4TOTAL", "FREET4", "T3FREE", "THYROIDAB" in the last 72 hours. Anemia Panel: No results for input(s): "VITAMINB12", "FOLATE", "FERRITIN", "TIBC", "IRON", "RETICCTPCT" in the last 72 hours. Sepsis Labs: No results for input(s): "PROCALCITON", "LATICACIDVEN" in the last 168 hours.  Recent Results (from the past 240 hour(s))  Urine Culture     Status: None   Collection Time: 07/24/22  9:18 PM   Specimen: Urine, Clean Catch  Result Value Ref Range Status   Specimen Description   Final    URINE, CLEAN CATCH Performed at [facility], [address], [place], [place] [address]    Special Requests   Final    NONE Performed at [facility], [address], [place], [place] [address]    Culture   Final    NO GROWTH Performed at [facility], [place] [address] [place], [place] [address]    Report Status 07/26/2022 FINAL  Final  SARS Coronavirus 2 by RT PCR (hospital order, performed in [facility] hospital lab) *cepheid single result test* Anterior Nasal Swab     Status: None   Collection Time: 07/24/22  9:55 PM   Specimen: Anterior Nasal Swab  Result Value Ref Range Status   SARS Coronavirus 2 by RT PCR NEGATIVE NEGATIVE Final    Comment: (NOTE) SARS-CoV-2 target nucleic acids are NOT DETECTED.  The SARS-CoV-2 RNA is generally detectable in upper and lower respiratory specimens during the acute phase of infection. The lowest concentration of SARS-CoV-2 viral copies this assay can detect is 250 copies / mL. A negative result does not preclude SARS-CoV-2 infection and should not be used as the sole basis for treatment or other patient management decisions.  A negative result may  occur with improper specimen collection / handling, submission of specimen other than nasopharyngeal swab, presence of viral mutation(s) within the areas targeted by this assay, and inadequate number of viral copies (<250 copies / mL). A negative result must be combined with clinical observations, patient history, and epidemiological information.  Fact Sheet for Patients:   https://www.patel.info/  Fact Sheet for Healthcare Providers: https://hall.com/  This test is not yet approved or  cleared by the [place] FDA and has been authorized for detection and/or diagnosis of SARS-CoV-2 by FDA under an Emergency Use Authorization (EUA).  This EUA will remain in effect (meaning this test can be used) for  the duration of the COVID-19 declaration under Section 564(b)(1) of the Act, 21 U.S.C. section 360bbb-3(b)(1), unless the authorization is terminated or revoked sooner.  Performed at [facility], [address] [place], [place] [address]   CSF culture w Gram Stain     Status: None (Preliminary result)   Collection Time: 07/25/22  2:54 PM   Specimen: PATH Cytology CSF; Cerebrospinal Fluid  Result Value Ref Range Status   Specimen Description   Final    CSF Performed at [facility], [address] [place], [place] [address]    Special Requests   Final    NONE Performed at [facility], [address] [place], [place] [address]    Gram Stain   Final    NO ORGANISMS SEEN NO WBC SEEN RED BLOOD CELLS PRESENT Performed at [facility], [address] [place], [place] [address]    Culture   Final    NO GROWTH 2 DAYS Performed at [facility], [place] [address] [place], [place] [address]    Report Status PENDING  Incomplete         Radiology Studies: DG Shoulder Left  Result Date: 07/26/2022 CLINICAL DATA:  Left shoulder pain EXAM: LEFT SHOULDER - 3 VIEW COMPARISON:  None  Available. FINDINGS: No evidence of fracture or dislocation. Mild degenerative changes of the glenohumeral joint. Surgical clips overlying the superior right mediastinum. Soft tissues are unremarkable. IMPRESSION: No acute osseous abnormality. Electronically Signed   By: Yetta Glassman M.D.   On: 07/26/2022 20:52        Scheduled Meds:  arformoterol  15 mcg Nebulization BID   And   umeclidinium bromide  1 puff Inhalation Daily   buPROPion  300 mg Oral Q breakfast   busPIRone  45 mg Oral Daily   And   busPIRone  30 mg Oral QHS   clonazePAM  1 mg Oral QHS   cyanocobalamin   Oral Daily   enoxaparin (LOVENOX) injection  40 mg Subcutaneous Q33A   folic acid  1 mg Oral Daily   insulin aspart  0-6 Units Subcutaneous TID WC   memantine  5 mg Oral BID   rosuvastatin  20 mg Oral QPM   tamsulosin  0.4 mg Oral Daily   Continuous Infusions:     LOS: 3 days    Time spent: 25 mins    Wyvonnia Dusky, MD [facility] Pager [phone]  If 7PM-7AM, please contact night-coverage [email or web address] 07/28/2022, 7:29 AM

## 2022-07-28 NOTE — Progress Notes (Signed)
Mobility Specialist - Progress Note    07/28/22 1400  Mobility  Activity Contraindicated/medical hold     Per PT, to hold off this date. Will attempt at another date and time.  Merrily Brittle Mobility Specialist 07/28/22, 2:19 PM

## 2022-07-29 ENCOUNTER — Inpatient Hospital Stay: Payer: Medicare PPO

## 2022-07-29 DIAGNOSIS — D649 Anemia, unspecified: Secondary | ICD-10-CM

## 2022-07-29 DIAGNOSIS — F0390 Unspecified dementia without behavioral disturbance: Secondary | ICD-10-CM | POA: Diagnosis not present

## 2022-07-29 DIAGNOSIS — G912 (Idiopathic) normal pressure hydrocephalus: Secondary | ICD-10-CM | POA: Diagnosis not present

## 2022-07-29 LAB — GLUCOSE, CAPILLARY
Glucose-Capillary: 133 mg/dL — ABNORMAL HIGH (ref 70–99)
Glucose-Capillary: 133 mg/dL — ABNORMAL HIGH (ref 70–99)
Glucose-Capillary: 121 mg/dL — ABNORMAL HIGH (ref 70–99)

## 2022-07-29 LAB — BASIC METABOLIC PANEL
Anion gap: 4 — ABNORMAL LOW (ref 5–15)
BUN: 16 mg/dL (ref 8–23)
CO2: 30 mmol/L (ref 22–32)
Calcium: 9.1 mg/dL (ref 8.9–10.3)
Chloride: 107 mmol/L (ref 98–111)
Creatinine, Ser: 0.65 mg/dL (ref 0.44–1.00)
GFR, Estimated: >60 mL/min (ref >60)
Glucose, Bld: 134 mg/dL — ABNORMAL HIGH (ref 70–99)
Potassium: 3.8 mmol/L (ref 3.5–5.1)
Sodium: 141 mmol/L (ref 135–145)

## 2022-07-29 LAB — CBC
HCT: 29.9 % — ABNORMAL LOW (ref 36.0–46.0)
Hemoglobin: 9.1 g/dL — ABNORMAL LOW (ref 12.0–15.0)
MCH: 27.4 pg (ref 26.0–34.0)
MCHC: 30.4 g/dL (ref 30.0–36.0)
MCV: 90.1 fL (ref 80.0–100.0)
Platelets: 269 10*3/uL (ref 150–400)
RBC: 3.32 MIL/uL — ABNORMAL LOW (ref 3.87–5.11)
RDW: 13.9 % (ref 11.5–15.5)
WBC: 9.1 10*3/uL (ref 4.0–10.5)
nRBC: 0 % (ref 0.0–0.2)

## 2022-07-29 LAB — MAGNESIUM: Magnesium: 2.1 mg/dL (ref 1.7–2.4)

## 2022-07-29 MED ORDER — ORAL CARE MOUTH RINSE
15.0000 mL | OROMUCOSAL | Status: DC | PRN
Start: 2022-07-29 — End: 2022-08-02

## 2022-07-29 MED ORDER — LIDOCAINE HCL (PF) 1 % IJ SOLN: 10.0000 mL | Freq: Once | INTRAMUSCULAR | Status: DC

## 2022-07-29 NOTE — Progress Notes (Signed)
PROGRESS NOTE   HPI was taken from Dr. Sidney Ace: Amanda Porter is a 76 y.o. African-American female with medical history significant for COPD, CVA, depression, dementia, type 2 diabetes mellitus, dysphagia, dyslipidemia, normal pressure hydrocephalus s/p VP shunt, who presented to the ER with acute onset of altered mental status today as well as agitation over the last 6 days and urinary incontinence over the last 4 to 5 days with unsteady gait likely.  She fell about a week ago and was having left elbow pain.  No reported fever or chills.  No nausea or vomiting or abdominal pain.  She has been having urinary frequency and urgency and was recently treated with p.o. Keflex for UTI that she is finishing soon.  No cough or wheezing or dyspnea.  No new paresthesias or focal muscle weakness.   ED Course: When she came to the ER, vital signs were within normal and later BP was 142/93.  Labs revealed blood gases with pH 7.48 and HCO3 of 35.7, PA CO2 of 48 and PO2 of 66 with O2 sat of 94.9%.  CMP showed potassium of 3.3 with CO2 of 34 and CBC showed anemia close to baseline.   Imaging: 1 view abdomen x-ray showed intact right frontal ventriculoperitoneal shunt, cholelithiasis and normal abdominal gas pattern.  C-spine x-ray showed the same with a shunt series as well as skull x-ray.  Portable chest ray showed no acute cardiopulmonary disease and did show emphysema left elbow x-ray was negative.  Noncontrasted CT scan showed progressive ventriculomegaly when compared to prior examination on 04/17/2022 with stable right frontal ventriculostomy catheter and stable advanced senescent changes and remote right thalamic infarct with no acute intracranial abnormalities.   The patient was given 1 g of p.o. Tylenol.  Dr. [name] was notified about the patient and recommended shunt series.  She will be admitted to a medical telemetry bed for further evaluation and management.  As per Dr. Jimmye Norman 8/25-8/29/23: Pt presented  w/ altered mental status from baseline. CT head showed progressive ventriculomegaly when compared to prior examine on 04/17/22, stable right frontal ventriculostomy catheter, stable advanced senescent changes & remote right thalamic infarct & no acute intracranial hemorrhage or infarct. Neuro surg was consulted & following pt. Pt is s/p LP and PT eval on 07/25/22. Pt still only remains oriented to person and place. It was attempted for a second large volume LP x2 but unfortunately IR was unable to obtain any CSF. No shunt revision at this time as per neuro surg.     Amanda Porter  DJT:[id number] DOB: 08-Feb-1946 DOA: 07/24/2022 PCP: Pleas Koch, NP    Assessment & Plan:   Principal Problem:   NPH (normal pressure hydrocephalus) (HCC) Active Problems:   Hypokalemia   Dementia without behavioral disturbance (HCC)   Anxiety and depression   Hyperlipidemia   Normocytic anemia  Assessment and Plan: NPH: presenting with acute metabolic encephalopathy, ataxia and urinary incontinence concerning about her worsening hydrocephalus & ventriculomegaly. Imaging showing intact ventriculoperitoneal shunt. S/p LP & PT eval on 07/25/22. Oriented to person and place only. No shunt revision at this time as per neuro surg.  Large volume LP attempted x 2 but IR unable to obtain CSF. Neuro surg following and recs apprec    Hypokalemia: WNL today    Dementia: continue on namenda. Haldol prn for agitation    Depression: severity unknown. Continue on home dose of wellbutrin, buspar   DM2: HbA1c 7.2, poorly controlled. Continue on SSI w/ accuchecks  Anxiety: severity unknown. Continue on home dose of klonopin   HLD: continue on statin   Normocytic anemia: H&H are labile. No need for a transfusion currently       DVT prophylaxis: lovenox  Code Status:  DNR Family Communication:   Disposition Plan: likely d/c back home w/ HH   Level of care: Med-Surg  Status is: Inpatient Remains inpatient  appropriate because: severity of illness   Consultants:  Neuro surg   Procedures:   Antimicrobials:   Subjective: Pt c/o fatigue   Objective: Vitals:   07/29/22 0742 07/29/22 0839 07/29/22 0914 07/29/22 0915  BP:  (!) 118/57  102/68  Pulse:  89  94  Resp:  17  16  Temp:  98.6 F (37 C) 98.4 F (36.9 C)   TempSrc:   Oral   SpO2: 98% 97%  99%  Weight:        Intake/Output Summary (Last 24 hours) at 07/29/2022 1240 Last data filed at 07/29/2022 0630 Gross per 24 hour  Intake 100 ml  Output 545 ml  Net -445 ml    Filed Weights   07/24/22 1810  Weight: 49 kg    Examination:  General exam: Appears comfortable but confused. Female facial hair distrubution pattern   Respiratory system: decreased breaths sounds b/l.  Cardiovascular system: S1 & S2+. No rubs or clicks  Gastrointestinal system: abd is soft, NT, ND & normal bowel sounds  Central nervous system: Awake and alert. Moves all extremities Psychiatry: judgement and insight appears poor. Flat mood and affect    Data Reviewed: I have personally reviewed following labs and imaging studies  CBC: Recent Labs  Lab 07/24/22 1818 07/25/22 0923 07/26/22 0456 07/27/22 0511 07/28/22 0410 07/29/22 0518  WBC 7.9 5.9 7.9 7.2 9.2 9.1  NEUTROABS 4.3  --   --   --   --   --   HGB 10.1* 9.8* 9.5* 9.6* 9.4* 9.1*  HCT 33.7* 31.8* 30.3* 30.5* 30.6* 29.9*  MCV 92.1 89.3 90.7 90.0 90.0 90.1  PLT 293 284 266 286 281 258   Basic Metabolic Panel: Recent Labs  Lab 07/24/22 1817 07/24/22 1818 07/25/22 0923 07/26/22 0456 07/27/22 0511 07/28/22 0410 07/29/22 0518  NA  --    < > 143 141 143 142 141  K  --    < > 4.2 3.5 3.4* 3.4* 3.8  CL  --    < > 105 107 107 106 107  CO2  --    < > 31 29 30 28 30   GLUCOSE  --    < > 147* 96 134* 123* 134*  BUN  --    < > 10 9 11 21 16   CREATININE  --    < > 0.61 0.56 1.08* 0.82 0.65  CALCIUM  --    < > 8.9 8.7* 9.1 9.2 9.1  MG 2.0  --   --   --   --   --  2.1   < > = values in  this interval not displayed.   GFR: Estimated Creatinine Clearance: 45.6 mL/min (by C-G formula based on SCr of 0.65 mg/dL). Liver Function Tests: Recent Labs  Lab 07/24/22 1818  AST 16  ALT 15  ALKPHOS 58  BILITOT 0.5  PROT 7.5  ALBUMIN 3.8   No results for input(s): "LIPASE", "AMYLASE" in the last 168 hours. No results for input(s): "AMMONIA" in the last 168 hours. Coagulation Profile: No results for input(s): "INR", "PROTIME" in the last 168  hours. Cardiac Enzymes: No results for input(s): "CKTOTAL", "CKMB", "CKMBINDEX", "TROPONINI" in the last 168 hours. BNP (last 3 results) Recent Labs    04/04/22 1025  PROBNP 13.0   HbA1C: No results for input(s): "HGBA1C" in the last 72 hours. CBG: Recent Labs  Lab 07/28/22 0736 07/28/22 1133 07/28/22 1713 07/28/22 2230 07/29/22 0924  GLUCAP 125* 210* 137* 148* 133*   Lipid Profile: No results for input(s): "CHOL", "HDL", "LDLCALC", "TRIG", "CHOLHDL", "LDLDIRECT" in the last 72 hours. Thyroid Function Tests: No results for input(s): "TSH", "T4TOTAL", "FREET4", "T3FREE", "THYROIDAB" in the last 72 hours. Anemia Panel: No results for input(s): "VITAMINB12", "FOLATE", "FERRITIN", "TIBC", "IRON", "RETICCTPCT" in the last 72 hours. Sepsis Labs: No results for input(s): "PROCALCITON", "LATICACIDVEN" in the last 168 hours.  Recent Results (from the past 240 hour(s))  Urine Culture     Status: None   Collection Time: 07/24/22  9:18 PM   Specimen: Urine, Clean Catch  Result Value Ref Range Status   Specimen Description   Final    URINE, CLEAN CATCH Performed at [facility], [address], [place], [place] [address]    Special Requests   Final    NONE Performed at [facility], [address] [place], [place] [address]    Culture   Final    NO GROWTH Performed at [facility], [place] [address], [place], [place] [address]    Report Status 07/26/2022 FINAL  Final  SARS Coronavirus 2 by RT PCR  (hospital order, performed in [facility] hospital lab) *cepheid single result test* Anterior Nasal Swab     Status: None   Collection Time: 07/24/22  9:55 PM   Specimen: Anterior Nasal Swab  Result Value Ref Range Status   SARS Coronavirus 2 by RT PCR NEGATIVE NEGATIVE Final    Comment: (NOTE) SARS-CoV-2 target nucleic acids are NOT DETECTED.  The SARS-CoV-2 RNA is generally detectable in upper and lower respiratory specimens during the acute phase of infection. The lowest concentration of SARS-CoV-2 viral copies this assay can detect is 250 copies / mL. A negative result does not preclude SARS-CoV-2 infection and should not be used as the sole basis for treatment or other patient management decisions.  A negative result may occur with improper specimen collection / handling, submission of specimen other than nasopharyngeal swab, presence of viral mutation(s) within the areas targeted by this assay, and inadequate number of viral copies (<250 copies / mL). A negative result must be combined with clinical observations, patient history, and epidemiological information.  Fact Sheet for Patients:   https://www.patel.info/  Fact Sheet for Healthcare Providers: https://hall.com/  This test is not yet approved or  cleared by the [place] FDA and has been authorized for detection and/or diagnosis of SARS-CoV-2 by FDA under an Emergency Use Authorization (EUA).  This EUA will remain in effect (meaning this test can be used) for the duration of the COVID-19 declaration under Section 564(b)(1) of the Act, 21 U.S.C. section 360bbb-3(b)(1), unless the authorization is terminated or revoked sooner.  Performed at [facility], [address] [place], [place] [address]   CSF culture w Gram Stain     Status: None   Collection Time: 07/25/22  2:54 PM   Specimen: PATH Cytology CSF; Cerebrospinal Fluid  Result Value Ref Range Status    Specimen Description   Final    CSF Performed at [facility], [address] [place], [place] [address]    Special Requests  Final    NONE Performed at [facility], [address] [place], [place] [address]    Gram Stain   Final    NO ORGANISMS SEEN NO WBC SEEN RED BLOOD CELLS PRESENT Performed at [facility], [address], [place], [place] [address]    Culture   Final    NO GROWTH 3 DAYS Performed at [facility], [place] [address] [place], [place] [address]    Report Status 07/28/2022 FINAL  Final         Radiology Studies: DG FL GUIDED LUMBAR PUNCTURE  Result Date: 07/29/2022 CLINICAL DATA:  Patient with a history of normal pressure hydrocephalus. Radiology team asked to perform a high-volume lumbar puncture. EXAM: DIAGNOSTIC LUMBAR PUNCTURE UNDER FLUOROSCOPIC GUIDANCE COMPARISON:  DG fluoro guided lumbar puncture 07/28/2022 FLUOROSCOPY: Radiation Exposure Index (as provided by the fluoroscopic device): 15.3 mGy Kerma PROCEDURE: Informed consent was obtained from the patient's daughter prior to the procedure, including potential complications of headache, allergy, and pain. With the patient prone, the lower back was prepped with Betadine. 1% Lidocaine was used for local anesthesia. Lumbar puncture was performed at the L3-L4 level using a 20 gauge needle with no return of CSF, only a small amount of blood. The procedure was stopped. The patient tolerated the procedure well and there were no apparent complications. IMPRESSION: Attempted L3-L4 lumbar puncture with return of only a small amount of blood. Read by: Soyla Dryer, NP Electronically Signed   By: Macy Mis M.D.   On: 07/29/2022 12:22   CT HEAD WO CONTRAST (5MM)  Result Date: 07/29/2022 CLINICAL DATA:  Head trauma, minor (Age >= 65y).  Unwitnessed fall. EXAM: CT HEAD WITHOUT CONTRAST TECHNIQUE: Contiguous axial images were obtained from the base of the skull through the vertex  without intravenous contrast. RADIATION DOSE REDUCTION: This exam was performed according to the departmental dose-optimization program which includes automated exposure control, adjustment of the mA and/or kV according to patient size and/or use of iterative reconstruction technique. COMPARISON:  Head CT 07/25/2022 FINDINGS: Brain: There is no evidence of an acute infarct, intracranial hemorrhage, mass, midline shift, or extra-axial fluid collection. A right frontal approach ventriculostomy catheter is unchanged, traversing the frontal horn of the right lateral ventricle and terminating in the midline. Ventriculomegaly is unchanged. Confluent hypodensities in the cerebral white matter bilaterally are unchanged and nonspecific but compatible with extensive chronic small vessel ischemic disease. A chronic lacunar infarct is again noted in the right thalamus. Vascular: Calcified atherosclerosis at the skull base. No hyperdense vessel. Skull: No fracture or suspicious osseous lesion. Sinuses/Orbits: Visualized paranasal sinuses and mastoid air cells are clear. Unremarkable orbits. Other: None. IMPRESSION: 1. No evidence of acute intracranial injury. 2. Unchanged ventriculomegaly with ventriculostomy catheter in place. 3. Extensive chronic small vessel ischemic disease. Electronically Signed   By: Logan Bores M.D.   On: 07/29/2022 08:39   DG FL GUIDED LUMBAR PUNCTURE  Result Date: 07/28/2022 CLINICAL DATA:  Patient history of normal pressure hydrocephalus. Request is for high volume lumbar puncture EXAM: DIAGNOSTIC LUMBAR PUNCTURE UNDER FLUOROSCOPIC GUIDANCE COMPARISON:  None Available. FLUOROSCOPY: Radiation Exposure Index (as provided by the fluoroscopic device): 5.20 mGy Kerma PROCEDURE: Informed consent was obtained from the patient prior to the procedure, including potential complications of headache, allergy, and pain. With the patient prone, the lower back was prepped with Betadine. 1% Lidocaine was used for  local anesthesia. Lumbar puncture was performed at the L4-5 and L5- S1 level using a 20 gauge  needle 3 inch with return of clear CSF. Cerebrospinal fluid pressure was noted to be low and we are unable to obtain enough fluid to obtain pressures or labs. Even with elevating the head of the patient by tilting the table, csf return was negligible with the needle tip in the thecal sac. The patient tolerated the procedure well and there were no apparent complications. IMPRESSION: Technically successful lumbar puncture performed showing low side of normal cerebral spinal pressures. This exam was performed by Karen Chafe.P. with the direct supervision of Dr. Nelson Chimes. Electronically Signed   By: Nelson Chimes M.D.   On: 07/28/2022 16:13        Scheduled Meds:  arformoterol  15 mcg Nebulization BID   And   umeclidinium bromide  1 puff Inhalation Daily   buPROPion  300 mg Oral Q breakfast   busPIRone  45 mg Oral Daily   And   busPIRone  30 mg Oral QHS   clonazePAM  1 mg Oral QHS   cyanocobalamin   Oral Daily   enoxaparin (LOVENOX) injection  40 mg Subcutaneous Y69S   folic acid  1 mg Oral Daily   insulin aspart  0-6 Units Subcutaneous TID WC   lidocaine (PF)  10 mL Intradermal Once   memantine  5 mg Oral BID   rosuvastatin  20 mg Oral QPM   tamsulosin  0.4 mg Oral Daily   Continuous Infusions:     LOS: 4 days    Time spent: 27 mins    Wyvonnia Dusky, MD [facility] Pager [phone]  If 7PM-7AM, please contact night-coverage [email or web address] 07/29/2022, 12:40 PM

## 2022-07-29 NOTE — Progress Notes (Signed)
       CROSS COVER NOTE  NAME: MARGY SUMLER MRN: [id number] DOB : 19-Mar-1946    Date of Service   07/29/2022   HPI/Events of Note   Notified by nursing that M(r)s Yuhas has a bladder scan volume of 320mL and has not voided since 1450 yesterday.  Interventions   Plan: In and Out Cath x1 Collect ordered Urine Culture      This document was prepared using Dragon voice recognition software and may include unintentional dictation errors.  Neomia Glass DNP, MHA, FNP-BC Nurse Practitioner [facility] Pager [phone]

## 2022-07-29 NOTE — Progress Notes (Signed)
       CROSS COVER NOTE  NAME: Amanda Porter MRN: [id number] DOB : 11-05-1946   Notified by nursing that patient has fallen. Patient is not complaining of any pain and there is no visible sign of injury on assessment. Patient has a history of dementia and is oriented to person and place at baseline and forgetful. The fall was unwitnessed. CT Head ordered. We will continue to monitor and consider additional testing if patient begins to exhibit any symptoms of  pain or change from neurological baseline.  This document was prepared using Dragon voice recognition software and may include unintentional dictation errors.  Neomia Glass DNP, MHA, FNP-BC Nurse Practitioner [facility] Pager [phone]

## 2022-07-29 NOTE — Progress Notes (Signed)
    Attending Progress Note  History: Amanda Porter is here for workup of possible shunt malfunction.  She previously had a shunt placed for normal pressure hydrocephalus.  Physical Exam: Vitals:   07/29/22 0915 07/29/22 1642  BP: 102/68 132/67  Pulse: 94 86  Resp: 16 16  Temp:  98.2 F (36.8 C)  SpO2: 99% 99%    AA Oxself, hospital CNI  Strength: MAEW symmetrically  Data:  Recent Labs  Lab 07/27/22 0511 07/28/22 0410 07/29/22 0518  NA 143 142 141  K 3.4* 3.4* 3.8  CL 107 106 107  CO2 30 28 30   BUN 11 21 16   CREATININE 1.08* 0.82 0.65  GLUCOSE 134* 123* 134*  CALCIUM 9.1 9.2 9.1   Recent Labs  Lab 07/24/22 1818  AST 16  ALT 15  ALKPHOS 58     Recent Labs  Lab 07/27/22 0511 07/28/22 0410 07/29/22 0518  WBC 7.2 9.2 9.1  HGB 9.6* 9.4* 9.1*  HCT 30.5* 30.6* 29.9*  PLT 286 281 269   No results for input(s): "APTT", "INR" in the last 168 hours.       Other tests/results: LP - unable to obtain CSF  Assessment/Plan:  Amanda Porter is here for possible shunt malfunction for normal pressure hydrocephalus.  She has had attempted lumbar puncture twice for confirmatory high-volume tap, but has been unable to obtain spinal fluid.  At this point, I think we should consider disposition and then discuss potential for shunt revision electively.  I discussed this with her daughter, who agrees.  We will reach out to her to schedule a follow-up appointment.  Meade Maw MD, [facility] Department of Neurosurgery

## 2022-07-29 NOTE — NC FL2 (Signed)
[place] LEVEL OF CARE SCREENING TOOL     IDENTIFICATION  Patient Name: Amanda Porter Birthdate: 01-27-1946 Sex: female Admission Date (Current Location): 07/24/2022  [facility] and [place] Number:  Engineering geologist and Address:  [facility], [address] [place], [place] [address]      Provider Number:    Attending Physician Name and Address:  Wyvonnia Dusky, MD  Relative Name and Phone Number:  [name] EYCX,[phone]    Current Level of Care: Hospital Recommended Level of Care: [place] Prior Approval Number:    Date Approved/Denied:   PASRR Number: [id number]  Discharge Plan: SNF    Current Diagnoses: Patient Active Problem List   Diagnosis Date Noted   Normocytic anemia 07/29/2022   NPH (normal pressure hydrocephalus) ([place]) 07/25/2022   Recurrent UTI 06/24/2022   Recurrent falls 06/24/2022   DNR (do not resuscitate) 06/24/2022   S/P VP shunt 04/22/2022   Subdural hematoma ([place]) 04/16/2022   Pleural effusion on left 04/08/2022   Decreased urine output 04/04/2022   Vitamin D deficiency 04/04/2022   Other fatigue 04/04/2022   Hypokalemia 04/04/2022   Urinary retention 12/13/2021   Wrist fracture, closed, left, sequela 12/13/2021   Protein-calorie malnutrition, severe 11/07/2021   Tardive dyskinesia 02/01/2020   Cervical spondylolysis 05/24/2019   Chronic respiratory failure with hypoxia ([place]) 02/25/2019   Postural dizziness 08/11/2018   Iron deficiency anemia 07/02/2018   Anemia 04/12/2018   Type 2 diabetes mellitus with hyperglycemia ([place]) 02/18/2018   Pain of left lower extremity 11/26/2017   Memory disturbance 11/20/2017   Right carotid bruit 11/20/2017   Hyperlipidemia 09/14/2017   COPD mixed type ([place]) 09/14/2017   Dementia without behavioral disturbance ([place]) 09/14/2017   Normal pressure hydrocephalus ([place]) 09/14/2017   Dysphagia 09/14/2017   Essential hypertension 09/14/2017    History of CVA (cerebrovascular accident) 09/14/2017   Anxiety and depression 09/14/2017   Tobacco use disorder 12/10/2016    Orientation RESPIRATION BLADDER Height & Weight     Self  O2 (O22L Per nasal cannula) Continent Weight: 49 kg Height:     BEHAVIORAL SYMPTOMS/MOOD NEUROLOGICAL BOWEL NUTRITION STATUS   (n/a)  (n/a) Continent Diet  AMBULATORY STATUS COMMUNICATION OF NEEDS Skin   Limited Assist Verbally Normal, Other (Comment) (Dry, Flaky)                       Personal Care Assistance Level of Assistance  Bathing, Feeding, Dressing Bathing Assistance: Limited assistance Feeding assistance: Limited assistance Dressing Assistance: Limited assistance     Functional Limitations Info  Sight Sight Info: Impaired        SPECIAL CARE FACTORS FREQUENCY  PT (By licensed PT), OT (By licensed OT)     PT Frequency: Min 2xweekly OT Frequency: Min 2x weekly            Contractures Contractures Info: Not present    Additional Factors Info  Code Status, Allergies Code Status Info: FULL Allergies Info: Lexapro (Escitalopram Oxalate), Neosporin  (Neomycin-polymyxin-gramicidin), Neosporin (Bacitracin-polymyxin B)           Current Medications (07/29/2022):  This is the current hospital active medication list Current Facility-Administered Medications  Medication Dose Route Frequency Provider Last Rate Last Admin   acetaminophen (TYLENOL) tablet 650 mg  650 mg Oral Q6H PRN Mansy, Jan A, MD   650 mg at 07/26/22 0944   Or   acetaminophen (TYLENOL) suppository 650 mg  650 mg Rectal Q6H PRN  Mansy, Jan A, MD       albuterol (PROVENTIL) (2.5 MG/3ML) 0.083% nebulizer solution 2.5 mg  2.5 mg Nebulization Q4H PRN Mansy, Jan A, MD       arformoterol [facility]) nebulizer solution 15 mcg  15 mcg Nebulization BID Mansy, Jan A, MD   15 mcg at 07/29/22 2044   And   umeclidinium bromide (INCRUSE ELLIPTA) 62.5 MCG/ACT 1 puff  1 puff Inhalation Daily Mansy, Jan A, MD   1 puff at 07/29/22  0901   buPROPion (WELLBUTRIN XL) 24 hr tablet 300 mg  300 mg Oral Q breakfast Mansy, Jan A, MD   300 mg at 07/29/22 0902   busPIRone (BUSPAR) tablet 45 mg  45 mg Oral Daily Madueme, Elvira C, RPH   45 mg at 07/29/22 0900   And   busPIRone (BUSPAR) tablet 30 mg  30 mg Oral QHS Madueme, Elvira C, RPH   30 mg at 07/29/22 2128   clonazePAM (KLONOPIN) tablet 1 mg  1 mg Oral QHS Mansy, Jan A, MD   1 mg at 07/29/22 2128   cyanocobalamin (VITAMIN B12) tablet   Oral Daily Mansy, Jan A, MD   500 mcg at 07/29/22 0902   enoxaparin (LOVENOX) injection 40 mg  40 mg Subcutaneous Q24H Mansy, Jan A, MD   40 mg at 38/25/05 3976   folic acid (FOLVITE) tablet 1 mg  1 mg Oral Daily Mansy, Jan A, MD   1 mg at 07/29/22 0902   guaiFENesin (MUCINEX) 12 hr tablet 600 mg  600 mg Oral PRN Mansy, Jan A, MD       insulin aspart (novoLOG) injection 0-6 Units  0-6 Units Subcutaneous TID WC Wyvonnia Dusky, MD   2 Units at 07/28/22 1244   lidocaine (PF) (XYLOCAINE) 1 % injection 10 mL  10 mL Intradermal Once Meade Maw, MD       magnesium hydroxide (MILK OF MAGNESIA) suspension 30 mL  30 mL Oral Daily PRN Mansy, Jan A, MD       memantine [facility]) tablet 5 mg  5 mg Oral BID Mansy, Jan A, MD   5 mg at 07/29/22 2128   ondansetron (ZOFRAN) tablet 4 mg  4 mg Oral Q6H PRN Mansy, Jan A, MD       Or   ondansetron [facility]) injection 4 mg  4 mg Intravenous Q6H PRN Mansy, Jan A, MD       Oral care mouth rinse  15 mL Mouth Rinse PRN Wyvonnia Dusky, MD       rosuvastatin (CRESTOR) tablet 20 mg  20 mg Oral QPM Mansy, Jan A, MD   20 mg at 07/29/22 1725   tamsulosin (FLOMAX) capsule 0.4 mg  0.4 mg Oral Daily Mansy, Jan A, MD   0.4 mg at 07/29/22 7341   traZODone (DESYREL) tablet 25 mg  25 mg Oral QHS PRN Mansy, Jan A, MD   25 mg at 07/28/22 2109     Discharge Medications: Please see discharge summary for a list of discharge medications.  Relevant Imaging Results:  Relevant Lab Results:   Additional Information SS#  [id number]  Laurena Slimmer, RN

## 2022-07-29 NOTE — Procedures (Signed)
Attempted L3-L4 lumbar puncture with a return of only a small amount of blood. Please see full dictation under imaging tab in Epic.  Soyla Dryer, [place] [phone] 07/29/2022, 12:04 PM

## 2022-07-29 NOTE — Progress Notes (Signed)
   07/29/22 0529  What Happened  Was fall witnessed? No  Was patient injured? No  Patient found on floor  Found by Staff-comment (NT found pt on the floor)  Stated prior activity bathroom-unassisted  Follow Up  MD notified Neomia Glass, NP  Time MD notified [phone]  Family notified Yes - comment (Daughter-Ashley Overbeck)  Time family notified [phone]  Additional tests Yes-comment (CT head ordered.)  Simple treatment Other (comment) (None. Pt denies pain.)  Progress note created (see row info) Yes  Adult Fall Risk Assessment  Risk Factor Category (scoring not indicated) High fall risk per protocol (document High fall risk)  Patient Fall Risk Level High fall risk  Adult Fall Risk Interventions  Required Bundle Interventions *See Row Information* High fall risk - low, moderate, and high requirements implemented  Additional Interventions Use of appropriate toileting equipment (bedpan, BSC, etc.)  Screening for Fall Injury Risk (To be completed on HIGH fall risk patients) - Assessing Need for Floor Mats  Risk For Fall Injury- Criteria for Floor Mats Confusion/dementia (+NuDESC, CIWA, TBI, etc.)  Will Implement Floor Mats Yes  Vitals  Temp 98.2 F (36.8 C)  Temp Source Oral  BP 139/64  BP Location Left Arm  BP Method Automatic  Patient Position (if appropriate) Lying  Pulse Rate 98  Pulse Rate Source Dinamap  Resp 20  Oxygen Therapy  SpO2 97 %  O2 Device Nasal Cannula  O2 Flow Rate (L/min) 2 L/min  Pain Assessment  Pain Scale 0-10  Pain Score 0  PCA/Epidural/Spinal Assessment  Respiratory Pattern Regular;Unlabored  Neurological  Neuro (WDL) X  Level of Consciousness Alert  Orientation Level Oriented to person;Oriented to place;Disoriented to time;Disoriented to situation  Theatre stage manager attention/concentration;Poor judgement;Memory impairment  Speech Clear  R Pupil Size (mm) 3  R Pupil Shape Round  R Pupil Reaction Brisk  L Pupil Size (mm) 3  L Pupil Shape  Round  L Pupil Reaction Brisk

## 2022-07-29 NOTE — TOC Progression Note (Addendum)
Transition of Care [facility]) - Progression Note    Patient Details  Name: Amanda Porter MRN: [id number] Date of Birth: [birth year]  [facility] CM/SW Contact  Laurena Slimmer, RN Phone Number: 07/29/2022, 2:30 PM  Clinical Narrative:    Spok with daughter regarding SNF recommmendation. Daughter prefers [facility], [facility], Radiation protection practitioner, and [facility] SNF's. [employer] completed. Bed search initiated.   Expected Discharge Plan: [place] Barriers to Discharge: Continued Medical Work up  Expected Discharge Plan and Services Expected Discharge Plan: [place] Choice: [place] arrangements for the past 2 months: Single Family Home                                       Social Determinants of Health (SDOH) Interventions    Readmission Risk Interventions     No data to display

## 2022-07-29 NOTE — Progress Notes (Signed)
Physical Therapy Treatment Patient Details Name: Amanda Porter MRN: [id number] DOB: 11-12-1946 Today's Date: 07/29/2022   History of Present Illness Pt is a 76 yo female that presented to the ED for chief comlplaint of AMS, agitation, urinary incontinence, 1 fall, unsteady gait. Per neurosurgery during admission plan is to perform high-volume lumbar punture. PMH includes CVA, COPD, s/p VP shunt, dementia, TD, and DM.    PT Comments    Pt presents to PT in bed and agreeable to participate in therapy services. Pt appears lethargic and confused today and requires physical assistance from therapy staff to perform all aspects of functional mobility, including constant physical assistance to prevent posterior LOB in standing. Attempted to perform Tinetti balance assessment but unable to complete d/t safety concerns. Would benefit from skilled PT at SNF to address above deficits in strength, functional mobility, balance, and activity tolerance to promote optimal return to PLOF.       Recommendations for follow up therapy are one component of a multi-disciplinary discharge planning process, led by the attending physician.  Recommendations may be updated based on patient status, additional functional criteria and insurance authorization.  Follow Up Recommendations  Skilled nursing-short term rehab (<3 hours/day) Can patient physically be transported by private vehicle: No   Assistance Recommended at Discharge Frequent or constant Supervision/Assistance  Patient can return home with the following A lot of help with walking and/or transfers;A lot of help with bathing/dressing/bathroom;Assistance with feeding;Assist for transportation;Direct supervision/assist for financial management;Assistance with cooking/housework;Direct supervision/assist for medications management   Equipment Recommendations  None recommended by PT    Recommendations for Other Services       Precautions / Restrictions  Precautions Precautions: Fall Restrictions Weight Bearing Restrictions: No     Mobility  Bed Mobility Overal bed mobility: Needs Assistance Bed Mobility: Supine to Sit, Sit to Supine     Supine to sit: Max assist Sit to supine: Max assist   General bed mobility comments: Pt minimally participated in sup<>sit transfers today.    Transfers Overall transfer level: Needs assistance Equipment used: Rolling walker (2 wheels) Transfers: Sit to/from Stand Sit to Stand: Max assist           General transfer comment: minimal participation from pt for STS    Ambulation/Gait Ambulation/Gait assistance:  (unsafe to attempt)                 Stairs             Wheelchair Mobility    Modified Rankin (Stroke Patients Only)       Balance Overall balance assessment: Needs assistance Sitting-balance support: Feet supported, Bilateral upper extremity supported Sitting balance-Leahy Scale: Fair     Standing balance support:  (pt unable to maintain standing balance without physical assistance from PT to prevent posterior LOB) Standing balance-Leahy Scale: Zero Standing balance comment: unable to statically stand                            Cognition Arousal/Alertness: Lethargic Behavior During Therapy: Flat affect Overall Cognitive Status: No family/caregiver present to determine baseline cognitive functioning                                 General Comments: pt oriented to self and reports she is in a "hospital" but cannot state where and does not respond when asked what year it is  Exercises      General Comments        Pertinent Vitals/Pain Pain Assessment Pain Assessment: PAINAD Breathing: normal Negative Vocalization: none Facial Expression: smiling or inexpressive Body Language: relaxed Consolability: no need to console PAINAD Score: 0 Pain Intervention(s): Monitored during session, Repositioned    Home  Living                          Prior Function            PT Goals (current goals can now be found in the care plan section) Acute Rehab PT Goals Patient Stated Goal: to go home PT Goal Formulation: With patient Time For Goal Achievement: 08/08/22 Potential to Achieve Goals: Good Progress towards PT goals: Not progressing toward goals - comment (Presents with decline in functional mobility since 8/28.)    Frequency    Min 2X/week      PT Plan Discharge plan needs to be updated    Co-evaluation              AM-PAC PT "6 Clicks" Mobility   Outcome Measure  Help needed turning from your back to your side while in a flat bed without using bedrails?: Total Help needed moving from lying on your back to sitting on the side of a flat bed without using bedrails?: Total Help needed moving to and from a bed to a chair (including a wheelchair)?: Total Help needed standing up from a chair using your arms (e.g., wheelchair or bedside chair)?: Total Help needed to walk in hospital room?: Total Help needed climbing 3-5 steps with a railing? : Total 6 Click Score: 6    End of Session Equipment Utilized During Treatment: Gait belt Activity Tolerance: Patient limited by lethargy Patient left: in bed;with call bell/phone within reach;with bed alarm set Nurse Communication: Mobility status PT Visit Diagnosis: Other abnormalities of gait and mobility (R26.89)     Time: 1749-4496 PT Time Calculation (min) (ACUTE ONLY): 23 min  Charges:                       Glenice Laine MPH, SPT 07/29/22, 1:06 PM

## 2022-07-30 DIAGNOSIS — G912 (Idiopathic) normal pressure hydrocephalus: Secondary | ICD-10-CM | POA: Diagnosis not present

## 2022-07-30 LAB — GLUCOSE, CAPILLARY
Glucose-Capillary: 130 mg/dL — ABNORMAL HIGH (ref 70–99)
Glucose-Capillary: 186 mg/dL — ABNORMAL HIGH (ref 70–99)
Glucose-Capillary: 111 mg/dL — ABNORMAL HIGH (ref 70–99)

## 2022-07-30 LAB — CBC
HCT: 32.1 % — ABNORMAL LOW (ref 36.0–46.0)
Hemoglobin: 9.8 g/dL — ABNORMAL LOW (ref 12.0–15.0)
MCH: 27.5 pg (ref 26.0–34.0)
MCHC: 30.5 g/dL (ref 30.0–36.0)
MCV: 89.9 fL (ref 80.0–100.0)
Platelets: 289 10*3/uL (ref 150–400)
RBC: 3.57 MIL/uL — ABNORMAL LOW (ref 3.87–5.11)
RDW: 13.8 % (ref 11.5–15.5)
WBC: 6.5 10*3/uL (ref 4.0–10.5)
nRBC: 0 % (ref 0.0–0.2)

## 2022-07-30 LAB — URINE CULTURE: Culture: NO GROWTH

## 2022-07-30 LAB — BASIC METABOLIC PANEL
Anion gap: 6 (ref 5–15)
BUN: 15 mg/dL (ref 8–23)
CO2: 30 mmol/L (ref 22–32)
Calcium: 9.2 mg/dL (ref 8.9–10.3)
Chloride: 106 mmol/L (ref 98–111)
Creatinine, Ser: 0.59 mg/dL (ref 0.44–1.00)
GFR, Estimated: >60 mL/min (ref >60)
Glucose, Bld: 108 mg/dL — ABNORMAL HIGH (ref 70–99)
Potassium: 4.1 mmol/L (ref 3.5–5.1)
Sodium: 142 mmol/L (ref 135–145)

## 2022-07-30 LAB — MAGNESIUM: Magnesium: 2.2 mg/dL (ref 1.7–2.4)

## 2022-07-30 MED ORDER — SODIUM CHLORIDE 0.9 % IV SOLN: INTRAVENOUS | Status: DC

## 2022-07-30 NOTE — Plan of Care (Signed)
  Problem: Education: Goal: Knowledge of General Education information will improve Description: Including pain rating scale, medication(s)/side effects and non-pharmacologic comfort measures Outcome: Progressing   Problem: Health Behavior/Discharge Planning: Goal: Ability to manage health-related needs will improve Outcome: Progressing   Problem: Clinical Measurements: Goal: Ability to maintain clinical measurements within normal limits will improve Outcome: Progressing Goal: Will remain free from infection Outcome: Progressing Goal: Diagnostic test results will improve Outcome: Progressing Goal: Respiratory complications will improve Outcome: Progressing Goal: Cardiovascular complication will be avoided Outcome: Progressing   Problem: Activity: Goal: Risk for activity intolerance will decrease Outcome: Progressing   Problem: Nutrition: Goal: Adequate nutrition will be maintained Outcome: Progressing   Problem: Coping: Goal: Level of anxiety will decrease Outcome: Progressing   Problem: Elimination: Goal: Will not experience complications related to bowel motility Outcome: Progressing Goal: Will not experience complications related to urinary retention Outcome: Progressing   Problem: Pain Managment: Goal: General experience of comfort will improve Outcome: Progressing   Problem: Safety: Goal: Ability to remain free from injury will improve Outcome: Progressing   Problem: Skin Integrity: Goal: Risk for impaired skin integrity will decrease Outcome: Progressing   Problem: Education: Goal: Ability to describe self-care measures that may prevent or decrease complications (Diabetes Survival Skills Education) will improve Outcome: Progressing Goal: Individualized Educational Video(s) Outcome: Progressing   Problem: Coping: Goal: Ability to adjust to condition or change in health will improve Outcome: Progressing   Problem: Fluid Volume: Goal: Ability to  maintain a balanced intake and output will improve Outcome: Progressing   Problem: Health Behavior/Discharge Planning: Goal: Ability to identify and utilize available resources and services will improve Outcome: Progressing Goal: Ability to manage health-related needs will improve Outcome: Progressing   Problem: Metabolic: Goal: Ability to maintain appropriate glucose levels will improve Outcome: Progressing   Problem: Nutritional: Goal: Maintenance of adequate nutrition will improve Outcome: Progressing Goal: Progress toward achieving an optimal weight will improve Outcome: Progressing   Problem: Skin Integrity: Goal: Risk for impaired skin integrity will decrease Outcome: Progressing   Problem: Tissue Perfusion: Goal: Adequacy of tissue perfusion will improve Outcome: Progressing   Problem: Education: Goal: Knowledge of General Education information will improve Description: Including pain rating scale, medication(s)/side effects and non-pharmacologic comfort measures Outcome: Progressing   Problem: Health Behavior/Discharge Planning: Goal: Ability to manage health-related needs will improve Outcome: Progressing   Problem: Clinical Measurements: Goal: Ability to maintain clinical measurements within normal limits will improve Outcome: Progressing Goal: Will remain free from infection Outcome: Progressing Goal: Diagnostic test results will improve Outcome: Progressing   Problem: Activity: Goal: Risk for activity intolerance will decrease Outcome: Progressing   Problem: Nutrition: Goal: Adequate nutrition will be maintained Outcome: Progressing   Problem: Elimination: Goal: Will not experience complications related to bowel motility Outcome: Progressing Goal: Will not experience complications related to urinary retention Outcome: Progressing   Problem: Pain Managment: Goal: General experience of comfort will improve Outcome: Progressing   Problem:  Safety: Goal: Ability to remain free from injury will improve Outcome: Progressing   Problem: Skin Integrity: Goal: Risk for impaired skin integrity will decrease Outcome: Progressing

## 2022-07-30 NOTE — TOC Progression Note (Signed)
Transition of Care [facility]) - Progression Note    Patient Details  Name: Amanda Porter MRN: [id number] Date of Birth: 1946/07/21  [facility] CM/SW Contact  Laurena Slimmer, RN Phone Number: 07/30/2022, 10:10 AM  Clinical Narrative:     Bed offers still pending request.    Expected Discharge Plan: [place] Barriers to Discharge: Continued Medical Work up  Expected Discharge Plan and Services Expected Discharge Plan: [place] arrangements for the past 2 months: Single Family Home                                       Social Determinants of Health (SDOH) Interventions    Readmission Risk Interventions     No data to display

## 2022-07-30 NOTE — Progress Notes (Signed)
Physical Therapy Treatment Patient Details Name: Amanda Porter MRN: [id number] DOB: 23-Oct-1946 Today's Date: 07/30/2022   History of Present Illness Pt is a 76 yo female that presented to the ED for chief comlplaint of AMS, agitation, urinary incontinence, 1 fall, unsteady gait. Per neurosurgery during admission plan is to perform high-volume lumbar punture. PMH includes CVA, COPD, s/p VP shunt, dementia, TD, and DM.    PT Comments    Pt with noted improvement in level of alertness compared to prior session.  Pt required significant assistance with functional tasks but was able to stand and take several steps at the EOB which she was not able to do during the prior session.  Pt's SpO2 and HR were both WNL on 2LO2/min with no adverse symptoms reported by the patient.  Pt will benefit from PT services in a SNF setting upon discharge to safely address deficits listed in patient problem list for decreased caregiver assistance and eventual return to PLOF.    Recommendations for follow up therapy are one component of a multi-disciplinary discharge planning process, led by the attending physician.  Recommendations may be updated based on patient status, additional functional criteria and insurance authorization.  Follow Up Recommendations  Skilled nursing-short term rehab (<3 hours/day) Can patient physically be transported by private vehicle: No   Assistance Recommended at Discharge Frequent or constant Supervision/Assistance  Patient can return home with the following A lot of help with walking and/or transfers;A lot of help with bathing/dressing/bathroom;Assistance with feeding;Assist for transportation;Direct supervision/assist for financial management;Assistance with cooking/housework;Direct supervision/assist for medications management   Equipment Recommendations  None recommended by PT    Recommendations for Other Services       Precautions / Restrictions Precautions Precautions:  Fall Restrictions Weight Bearing Restrictions: No     Mobility  Bed Mobility Overal bed mobility: Needs Assistance Bed Mobility: Supine to Sit, Sit to Supine     Supine to sit: Max assist Sit to supine: Max assist   General bed mobility comments: Max A for BLE and trunk control    Transfers Overall transfer level: Needs assistance Equipment used: Rolling walker (2 wheels) Transfers: Sit to/from Stand Sit to Stand: Mod assist, From elevated surface           General transfer comment: Mod verbal and tactile cues to initiate stand and mod A to come to full standing position    Ambulation/Gait Ambulation/Gait assistance: Min assist Gait Distance (Feet): 3 Feet Assistive device: Rolling walker (2 wheels) Gait Pattern/deviations: Step-to pattern, Shuffle Gait velocity: decreased     General Gait Details: Pt able to take several small, shuffling steps at the EOB with min A for stability and to guide the RW   Stairs             Wheelchair Mobility    Modified Rankin (Stroke Patients Only)       Balance Overall balance assessment: Needs assistance Sitting-balance support: Feet supported, Bilateral upper extremity supported Sitting balance-Leahy Scale: Fair     Standing balance support: Bilateral upper extremity supported, During functional activity Standing balance-Leahy Scale: Poor                              Cognition Arousal/Alertness: Awake/alert Behavior During Therapy: WFL for tasks assessed/performed Overall Cognitive Status: No family/caregiver present to determine baseline cognitive functioning  Exercises Total Joint Exercises Ankle Circles/Pumps: AROM, AAROM, Both, 5 reps, 10 reps Quad Sets: AAROM, Strengthening, Both, 10 reps, 5 reps Hip ABduction/ADduction: AAROM, Strengthening, Both, 10 reps Straight Leg Raises: AAROM, Strengthening, Both, 10 reps Other  Exercises Other Exercises: Max verbal and tactile cuing for proper therex technique    General Comments        Pertinent Vitals/Pain Pain Assessment Pain Assessment: No/denies pain    Home Living                          Prior Function            PT Goals (current goals can now be found in the care plan section) Progress towards PT goals: Progressing toward goals    Frequency    Min 2X/week      PT Plan Current plan remains appropriate    Co-evaluation              AM-PAC PT "6 Clicks" Mobility   Outcome Measure  Help needed turning from your back to your side while in a flat bed without using bedrails?: A Lot Help needed moving from lying on your back to sitting on the side of a flat bed without using bedrails?: A Lot Help needed moving to and from a bed to a chair (including a wheelchair)?: A Lot Help needed standing up from a chair using your arms (e.g., wheelchair or bedside chair)?: A Lot Help needed to walk in hospital room?: Total Help needed climbing 3-5 steps with a railing? : Total 6 Click Score: 10    End of Session Equipment Utilized During Treatment: Gait belt Activity Tolerance: Patient tolerated treatment well Patient left: in bed;with call bell/phone within reach;with bed alarm set Nurse Communication: Mobility status PT Visit Diagnosis: Muscle weakness (generalized) (M62.81);Difficulty in walking, not elsewhere classified (R26.2)     Time: 3845-3646 PT Time Calculation (min) (ACUTE ONLY): 26 min  Charges:  $Therapeutic Exercise: 8-22 mins $Therapeutic Activity: 8-22 mins                     D. Scott Miquel Lamson PT, DPT 07/30/22, 3:47 PM

## 2022-07-30 NOTE — Progress Notes (Signed)
PROGRESS NOTE   HPI was taken from Dr. Sidney Ace: Amanda Porter is a 76 y.o. African-American female with medical history significant for COPD, CVA, depression, dementia, type 2 diabetes mellitus, dysphagia, dyslipidemia, normal pressure hydrocephalus s/p VP shunt, who presented to the ER with acute onset of altered mental status today as well as agitation over the last 6 days and urinary incontinence over the last 4 to 5 days with unsteady gait likely.  She fell about a week ago and was having left elbow pain.  No reported fever or chills.  No nausea or vomiting or abdominal pain.  She has been having urinary frequency and urgency and was recently treated with p.o. Keflex for UTI that she is finishing soon.  No cough or wheezing or dyspnea.  No new paresthesias or focal muscle weakness.   ED Course: When she came to the ER, vital signs were within normal and later BP was 142/93.  Labs revealed blood gases with pH 7.48 and HCO3 of 35.7, PA CO2 of 48 and PO2 of 66 with O2 sat of 94.9%.  CMP showed potassium of 3.3 with CO2 of 34 and CBC showed anemia close to baseline.   Imaging: 1 view abdomen x-ray showed intact right frontal ventriculoperitoneal shunt, cholelithiasis and normal abdominal gas pattern.  C-spine x-ray showed the same with a shunt series as well as skull x-ray.  Portable chest ray showed no acute cardiopulmonary disease and did show emphysema left elbow x-ray was negative.  Noncontrasted CT scan showed progressive ventriculomegaly when compared to prior examination on 04/17/2022 with stable right frontal ventriculostomy catheter and stable advanced senescent changes and remote right thalamic infarct with no acute intracranial abnormalities.   The patient was given 1 g of p.o. Tylenol.  Dr. [name] was notified about the patient and recommended shunt series.  She will be admitted to a medical telemetry bed for further evaluation and management.  As per Dr. Jimmye Norman 8/25-8/29/23: Pt presented  w/ altered mental status from baseline. CT head showed progressive ventriculomegaly when compared to prior examine on 04/17/22, stable right frontal ventriculostomy catheter, stable advanced senescent changes & remote right thalamic infarct & no acute intracranial hemorrhage or infarct. Neuro surg was consulted & following pt. Pt is s/p LP and PT eval on 07/25/22. Pt still only remains oriented to person and place. It was attempted for a second large volume LP x2 but unfortunately IR was unable to obtain any CSF. No shunt revision at this time as per neuro surg.   8/30 no issues overnight  [name]  NFA:[id number] DOB: 27-Mar-1946 DOA: 07/24/2022 PCP: Pleas Koch, NP    Assessment & Plan:   Principal Problem:   NPH (normal pressure hydrocephalus) (HCC) Active Problems:   Hypokalemia   Dementia without behavioral disturbance (HCC)   Anxiety and depression   Hyperlipidemia   Normocytic anemia  Assessment and Plan: NPH: presenting with acute metabolic encephalopathy, ataxia and urinary incontinence concerning about her worsening hydrocephalus & ventriculomegaly. Imaging showing intact ventriculoperitoneal shunt. S/p LP & PT eval on 07/25/22. Oriented to person and place only. No shunt revision at this time as per neuro surg.  Large volume LP attempted x 2 but IR unable to obtain CSF. 8/20 neurosurg. Following. Discuss potential for shunt revision electively     Hypokalemia-stable   Dementia: Continue Namenda.   Haldol as needed for agitation    Depression: severity unknown.  Continue home meds Wellbutrin and buspar    DM2: HbA1c 7.2, poorly  controlled. Continue on SSI w/ accuchecks  Anxiety: severity unknown. Continue on home dose of klonopin   HLD: continue on statin   Normocytic anemia: H&H are labile. No need for a transfusion currently       DVT prophylaxis: lovenox  Code Status:  DNR Family Communication:   Disposition Plan: likely d/c back home w/ HH    Level of care: Med-Surg  Status is: Inpatient Remains inpatient appropriate because: severity of illness   Consultants:  Neuro surg   Procedures:   Antimicrobials:   Subjective: Has no complaints this am.    Objective: Vitals:   07/30/22 0722 07/30/22 0736 07/30/22 0750 07/30/22 1217  BP:   (!) 128/57 119/64  Pulse:   88 (!) 104  Resp:   15 16  Temp:   98.2 F (36.8 C) 98.4 F (36.9 C)  TempSrc:      SpO2:  97% 98% 99%  Weight:      Height: 5\' 2"  (1.575 m)       Intake/Output Summary (Last 24 hours) at 07/30/2022 1218 Last data filed at 07/29/2022 2000 Gross per 24 hour  Intake 240 ml  Output 300 ml  Net -60 ml    Filed Weights   07/24/22 1810  Weight: 49 kg    Examination: Calm, NAD Cta no w/r Reg s1/s2 no gallop Soft benign +bs No edema Awake and alert. Mood and affect appropriate in current setting     Data Reviewed: I have personally reviewed following labs and imaging studies  CBC: Recent Labs  Lab 07/24/22 1818 07/25/22 0923 07/26/22 0456 07/27/22 0511 07/28/22 0410 07/29/22 0518 07/30/22 0438  WBC 7.9   < > 7.9 7.2 9.2 9.1 6.5  NEUTROABS 4.3  --   --   --   --   --   --   HGB 10.1*   < > 9.5* 9.6* 9.4* 9.1* 9.8*  HCT 33.7*   < > 30.3* 30.5* 30.6* 29.9* 32.1*  MCV 92.1   < > 90.7 90.0 90.0 90.1 89.9  PLT 293   < > 266 286 281 269 289   < > = values in this interval not displayed.   Basic Metabolic Panel: Recent Labs  Lab 07/24/22 1817 07/24/22 1818 07/26/22 0456 07/27/22 0511 07/28/22 0410 07/29/22 0518 07/30/22 0438  NA  --    < > 141 143 142 141 142  K  --    < > 3.5 3.4* 3.4* 3.8 4.1  CL  --    < > 107 107 106 107 106  CO2  --    < > 29 30 28 30 30   GLUCOSE  --    < > 96 134* 123* 134* 108*  BUN  --    < > 9 11 21 16 15   CREATININE  --    < > 0.56 1.08* 0.82 0.65 0.59  CALCIUM  --    < > 8.7* 9.1 9.2 9.1 9.2  MG 2.0  --   --   --   --  2.1 2.2   < > = values in this interval not displayed.   GFR: Estimated  Creatinine Clearance: 45.6 mL/min (by C-G formula based on SCr of 0.59 mg/dL). Liver Function Tests: Recent Labs  Lab 07/24/22 1818  AST 16  ALT 15  ALKPHOS 58  BILITOT 0.5  PROT 7.5  ALBUMIN 3.8   No results for input(s): "LIPASE", "AMYLASE" in the last 168 hours. No results for input(s): "AMMONIA"  in the last 168 hours. Coagulation Profile: No results for input(s): "INR", "PROTIME" in the last 168 hours. Cardiac Enzymes: No results for input(s): "CKTOTAL", "CKMB", "CKMBINDEX", "TROPONINI" in the last 168 hours. BNP (last 3 results) Recent Labs    04/04/22 1025  PROBNP 13.0   HbA1C: No results for input(s): "HGBA1C" in the last 72 hours. CBG: Recent Labs  Lab 07/28/22 2230 07/29/22 0924 07/29/22 1243 07/29/22 1737 07/30/22 0751  GLUCAP 148* 133* 133* 121* 130*   Lipid Profile: No results for input(s): "CHOL", "HDL", "LDLCALC", "TRIG", "CHOLHDL", "LDLDIRECT" in the last 72 hours. Thyroid Function Tests: No results for input(s): "TSH", "T4TOTAL", "FREET4", "T3FREE", "THYROIDAB" in the last 72 hours. Anemia Panel: No results for input(s): "VITAMINB12", "FOLATE", "FERRITIN", "TIBC", "IRON", "RETICCTPCT" in the last 72 hours. Sepsis Labs: No results for input(s): "PROCALCITON", "LATICACIDVEN" in the last 168 hours.  Recent Results (from the past 240 hour(s))  Urine Culture     Status: None   Collection Time: 07/24/22  9:18 PM   Specimen: Urine, Clean Catch  Result Value Ref Range Status   Specimen Description   Final    URINE, CLEAN CATCH Performed at [facility], [address], [place], [place] [address]    Special Requests   Final    NONE Performed at [facility], [address] [place], [place] [address]    Culture   Final    NO GROWTH Performed at [facility], [place] [address] [place], [place] [address]    Report Status 07/26/2022 FINAL  Final  SARS Coronavirus 2 by RT PCR (hospital order, performed in [facility] hospital  lab) *cepheid single result test* Anterior Nasal Swab     Status: None   Collection Time: 07/24/22  9:55 PM   Specimen: Anterior Nasal Swab  Result Value Ref Range Status   SARS Coronavirus 2 by RT PCR NEGATIVE NEGATIVE Final    Comment: (NOTE) SARS-CoV-2 target nucleic acids are NOT DETECTED.  The SARS-CoV-2 RNA is generally detectable in upper and lower respiratory specimens during the acute phase of infection. The lowest concentration of SARS-CoV-2 viral copies this assay can detect is 250 copies / mL. A negative result does not preclude SARS-CoV-2 infection and should not be used as the sole basis for treatment or other patient management decisions.  A negative result may occur with improper specimen collection / handling, submission of specimen other than nasopharyngeal swab, presence of viral mutation(s) within the areas targeted by this assay, and inadequate number of viral copies (<250 copies / mL). A negative result must be combined with clinical observations, patient history, and epidemiological information.  Fact Sheet for Patients:   https://www.patel.info/  Fact Sheet for Healthcare Providers: https://hall.com/  This test is not yet approved or  cleared by the [place] FDA and has been authorized for detection and/or diagnosis of SARS-CoV-2 by FDA under an Emergency Use Authorization (EUA).  This EUA will remain in effect (meaning this test can be used) for the duration of the COVID-19 declaration under Section 564(b)(1) of the Act, 21 U.S.C. section 360bbb-3(b)(1), unless the authorization is terminated or revoked sooner.  Performed at [facility], [address] [place],  [address]   CSF culture w Gram Stain     Status: None   Collection Time: 07/25/22  2:54 PM   Specimen: PATH Cytology CSF; Cerebrospinal Fluid  Result Value Ref Range Status   Specimen Description   Final    CSF Performed at  [facility], [address], [place], [place] [address]    Special Requests   Final    NONE Performed at [facility], [address] [place], [place] [address]    Gram Stain   Final    NO ORGANISMS SEEN NO WBC SEEN RED BLOOD CELLS PRESENT Performed at [facility], [address], [place], [place] [address]    Culture   Final    NO GROWTH 3 DAYS Performed at [facility], [place] [address], [place], [place] [address]    Report Status 07/28/2022 FINAL  Final  Urine Culture     Status: None   Collection Time: 07/29/22  6:33 AM   Specimen: Urine, Clean Catch  Result Value Ref Range Status   Specimen Description   Final    URINE, CLEAN CATCH Performed at [facility], [address], [place], [place] [address]    Special Requests   Final    NONE Performed at [facility], [address] [place], [place] [address]    Culture   Final    NO GROWTH Performed at [facility], [place] [address], [place],  [address]    Report Status 07/30/2022 FINAL  Final         Radiology Studies: DG FL GUIDED LUMBAR PUNCTURE  Result Date: 07/29/2022 CLINICAL DATA:  Patient with a history of normal pressure hydrocephalus. Radiology team asked to perform a high-volume lumbar puncture. EXAM: DIAGNOSTIC LUMBAR PUNCTURE UNDER FLUOROSCOPIC GUIDANCE COMPARISON:  DG fluoro guided lumbar puncture 07/28/2022 FLUOROSCOPY: Radiation Exposure Index (as provided by the fluoroscopic device): 15.3 mGy Kerma PROCEDURE: Informed consent was obtained from the patient's daughter prior to the procedure, including potential complications of headache, allergy, and pain. With the patient prone, the lower back was prepped with Betadine. 1% Lidocaine was used for local anesthesia. Lumbar puncture was performed at the L3-L4 level using a 20 gauge needle with no return of CSF, only a small amount of blood. The procedure was stopped. The patient tolerated the  procedure well and there were no apparent complications. IMPRESSION: Attempted L3-L4 lumbar puncture with return of only a small amount of blood. Read by: Soyla Dryer, NP Electronically Signed   By: Macy Mis M.D.   On: 07/29/2022 12:22   CT HEAD WO CONTRAST (5MM)  Result Date: 07/29/2022 CLINICAL DATA:  Head trauma, minor (Age >= 65y).  Unwitnessed fall. EXAM: CT HEAD WITHOUT CONTRAST TECHNIQUE: Contiguous axial images were obtained from the base of the skull through the vertex without intravenous contrast. RADIATION DOSE REDUCTION: This exam was performed according to the departmental dose-optimization program which includes automated exposure control, adjustment of the mA and/or kV according to patient size and/or use of iterative reconstruction technique. COMPARISON:  Head CT 07/25/2022 FINDINGS: Brain: There is no evidence of an acute infarct, intracranial hemorrhage, mass, midline shift, or extra-axial fluid collection. A right frontal approach ventriculostomy catheter is unchanged, traversing the frontal horn of the right lateral ventricle and terminating in the midline. Ventriculomegaly is unchanged. Confluent hypodensities in the cerebral white matter bilaterally are unchanged and nonspecific but compatible with extensive chronic small vessel ischemic disease. A chronic lacunar infarct is again noted in the right thalamus. Vascular: Calcified atherosclerosis at the skull base. No hyperdense vessel. Skull: No fracture or suspicious osseous lesion. Sinuses/Orbits: Visualized paranasal sinuses and mastoid air cells are clear. Unremarkable orbits. Other: None. IMPRESSION: 1. No evidence of acute intracranial injury. 2. Unchanged ventriculomegaly with ventriculostomy  catheter in place. 3. Extensive chronic small vessel ischemic disease. Electronically Signed   By: Logan Bores M.D.   On: 07/29/2022 08:39   DG FL GUIDED LUMBAR PUNCTURE  Result Date: 07/28/2022 CLINICAL DATA:  Patient history of  normal pressure hydrocephalus. Request is for high volume lumbar puncture EXAM: DIAGNOSTIC LUMBAR PUNCTURE UNDER FLUOROSCOPIC GUIDANCE COMPARISON:  None Available. FLUOROSCOPY: Radiation Exposure Index (as provided by the fluoroscopic device): 5.20 mGy Kerma PROCEDURE: Informed consent was obtained from the patient prior to the procedure, including potential complications of headache, allergy, and pain. With the patient prone, the lower back was prepped with Betadine. 1% Lidocaine was used for local anesthesia. Lumbar puncture was performed at the L4-5 and L5- S1 level using a 20 gauge needle 3 inch with return of clear CSF. Cerebrospinal fluid pressure was noted to be low and we are unable to obtain enough fluid to obtain pressures or labs. Even with elevating the head of the patient by tilting the table, csf return was negligible with the needle tip in the thecal sac. The patient tolerated the procedure well and there were no apparent complications. IMPRESSION: Technically successful lumbar puncture performed showing low side of normal cerebral spinal pressures. This exam was performed by Karen Chafe.P. with the direct supervision of Dr. Nelson Chimes. Electronically Signed   By: Nelson Chimes M.D.   On: 07/28/2022 16:13        Scheduled Meds:  arformoterol  15 mcg Nebulization BID   And   umeclidinium bromide  1 puff Inhalation Daily   buPROPion  300 mg Oral Q breakfast   busPIRone  45 mg Oral Daily   And   busPIRone  30 mg Oral QHS   clonazePAM  1 mg Oral QHS   cyanocobalamin   Oral Daily   enoxaparin (LOVENOX) injection  40 mg Subcutaneous E28M   folic acid  1 mg Oral Daily   insulin aspart  0-6 Units Subcutaneous TID WC   lidocaine (PF)  10 mL Intradermal Once   memantine  5 mg Oral BID   rosuvastatin  20 mg Oral QPM   tamsulosin  0.4 mg Oral Daily   Continuous Infusions:     LOS: 5 days    Time spent: 35 mins    Nolberto Hanlon, MD [facility] Pager [phone]  [phone]  If 7PM-7AM, please contact night-coverage [email or web address] 07/30/2022, 12:18 PM

## 2022-07-31 DIAGNOSIS — G912 (Idiopathic) normal pressure hydrocephalus: Secondary | ICD-10-CM | POA: Diagnosis not present

## 2022-07-31 LAB — GLUCOSE, CAPILLARY
Glucose-Capillary: 124 mg/dL — ABNORMAL HIGH (ref 70–99)
Glucose-Capillary: 182 mg/dL — ABNORMAL HIGH (ref 70–99)
Glucose-Capillary: 115 mg/dL — ABNORMAL HIGH (ref 70–99)
Glucose-Capillary: 152 mg/dL — ABNORMAL HIGH (ref 70–99)

## 2022-07-31 NOTE — Progress Notes (Signed)
I&O for 500 of dark yellow, concentrated foul smelling urine with sediment. Also white vaginal drainage noted. MD made aware.

## 2022-07-31 NOTE — Progress Notes (Addendum)
PROGRESS NOTE   HPI was taken from Dr. Sidney Ace: Amanda Porter is a 76 y.o. African-American female with medical history significant for COPD, CVA, depression, dementia, type 2 diabetes mellitus, dysphagia, dyslipidemia, normal pressure hydrocephalus s/p VP shunt, who presented to the ER with acute onset of altered mental status today as well as agitation over the last 6 days and urinary incontinence over the last 4 to 5 days with unsteady gait likely.  She fell about a week ago and was having left elbow pain.  No reported fever or chills.  No nausea or vomiting or abdominal pain.  She has been having urinary frequency and urgency and was recently treated with p.o. Keflex for UTI that she is finishing soon.  No cough or wheezing or dyspnea.  No new paresthesias or focal muscle weakness.   ED Course: When she came to the ER, vital signs were within normal and later BP was 142/93.  Labs revealed blood gases with pH 7.48 and HCO3 of 35.7, PA CO2 of 48 and PO2 of 66 with O2 sat of 94.9%.  CMP showed potassium of 3.3 with CO2 of 34 and CBC showed anemia close to baseline.   Imaging: 1 view abdomen x-ray showed intact right frontal ventriculoperitoneal shunt, cholelithiasis and normal abdominal gas pattern.  C-spine x-ray showed the same with a shunt series as well as skull x-ray.  Portable chest ray showed no acute cardiopulmonary disease and did show emphysema left elbow x-ray was negative.  Noncontrasted CT scan showed progressive ventriculomegaly when compared to prior examination on 04/17/2022 with stable right frontal ventriculostomy catheter and stable advanced senescent changes and remote right thalamic infarct with no acute intracranial abnormalities.   The patient was given 1 g of p.o. Tylenol.  Dr. [name] was notified about the patient and recommended shunt series.  She will be admitted to a medical telemetry bed for further evaluation and management.  As per Dr. Jimmye Norman 8/25-8/29/23: Pt presented  w/ altered mental status from baseline. CT head showed progressive ventriculomegaly when compared to prior examine on 04/17/22, stable right frontal ventriculostomy catheter, stable advanced senescent changes & remote right thalamic infarct & no acute intracranial hemorrhage or infarct. Neuro surg was consulted & following pt. Pt is s/p LP and PT eval on 07/25/22. Pt still only remains oriented to person and place. It was attempted for a second large volume LP x2 but unfortunately IR was unable to obtain any CSF. No shunt revision at this time as per neuro surg.   8/31 more alert this am. Has no complaints.    Amanda Porter  ELF:[id number] DOB: [birth year] DOA: 07/24/2022 PCP: Pleas Koch, NP    Assessment & Plan:   Principal Problem:   NPH (normal pressure hydrocephalus) (HCC) Active Problems:   Hypokalemia   Dementia without behavioral disturbance (HCC)   Anxiety and depression   Hyperlipidemia   Normocytic anemia  Assessment and Plan: NPH: presenting with acute metabolic encephalopathy, ataxia and urinary incontinence concerning about her worsening hydrocephalus & ventriculomegaly. Imaging showing intact ventriculoperitoneal shunt. S/p LP & PT eval on 07/25/22. Oriented to person and place only. No shunt revision at this time as per neuro surg.  Large volume LP attempted x 2 but IR unable to obtain CSF. 8/30 neurosurgery following.  Discussed potential for shunt revision electively.  They will make an appointment for patient.   Was started on gentle ivf hydration, appears more alert this am. Encouraged po intake     Hypokalemia-stable  Dementia: Continue Namenda Haldol as needed for agitation   Depression: severity unknown.  Continue with Wellbutrin and BuSpar     DM2: HbA1c 7.2, poorly controlled. Continue on SSI w/ accuchecks  Anxiety: severity unknown. Continue on home dose of klonopin   HLD: continue on statin   Normocytic anemia: H&H are labile. No need for a  transfusion currently       DVT prophylaxis: lovenox  Code Status:   Family Communication:  full  -confirmed with daughter [name] Disposition Plan: SNF bed pending  Level of care: Med-Surg  Status is: Inpatient Remains inpatient appropriate because: severity of illness   Consultants:  Neuro surg   Procedures:   Antimicrobials:   Subjective: Denies shortness of breath or chest pain, no abdominal pain   Objective: Vitals:   07/30/22 2049 07/31/22 0401 07/31/22 0739 07/31/22 0744  BP: 124/73 (!) 127/59 123/61   Pulse: 92 82 81   Resp: 17 16 16    Temp: 98.3 F (36.8 C) 98.6 F (37 C) 98.3 F (36.8 C)   TempSrc:  Oral    SpO2: 97% 95% 100% 99%  Weight:      Height:        Intake/Output Summary (Last 24 hours) at 07/31/2022 1223 Last data filed at 07/31/2022 0900 Gross per 24 hour  Intake 361.73 ml  Output 500 ml  Net -138.27 ml    Filed Weights   07/24/22 1810  Weight: 49 kg    Examination: Calm, NAD Cta no w/r Reg s1/s2 no gallop Soft benign +bs No edema Aaoxox2 not to present date Mood and affect appropriate in current setting    Data Reviewed: I have personally reviewed following labs and imaging studies  CBC: Recent Labs  Lab 07/24/22 1818 07/25/22 0923 07/26/22 0456 07/27/22 0511 07/28/22 0410 07/29/22 0518 07/30/22 0438  WBC 7.9   < > 7.9 7.2 9.2 9.1 6.5  NEUTROABS 4.3  --   --   --   --   --   --   HGB 10.1*   < > 9.5* 9.6* 9.4* 9.1* 9.8*  HCT 33.7*   < > 30.3* 30.5* 30.6* 29.9* 32.1*  MCV 92.1   < > 90.7 90.0 90.0 90.1 89.9  PLT 293   < > 266 286 281 269 289   < > = values in this interval not displayed.   Basic Metabolic Panel: Recent Labs  Lab 07/24/22 1817 07/24/22 1818 07/26/22 0456 07/27/22 0511 07/28/22 0410 07/29/22 0518 07/30/22 0438  NA  --    < > 141 143 142 141 142  K  --    < > 3.5 3.4* 3.4* 3.8 4.1  CL  --    < > 107 107 106 107 106  CO2  --    < > 29 30 28 30 30   GLUCOSE  --    < > 96 134* 123* 134*  108*  BUN  --    < > 9 11 21 16 15   CREATININE  --    < > 0.56 1.08* 0.82 0.65 0.59  CALCIUM  --    < > 8.7* 9.1 9.2 9.1 9.2  MG 2.0  --   --   --   --  2.1 2.2   < > = values in this interval not displayed.   GFR: Estimated Creatinine Clearance: 45.6 mL/min (by C-G formula based on SCr of 0.59 mg/dL). Liver Function Tests: Recent Labs  Lab 07/24/22 1818  AST 16  ALT 15  ALKPHOS 58  BILITOT 0.5  PROT 7.5  ALBUMIN 3.8   No results for input(s): "LIPASE", "AMYLASE" in the last 168 hours. No results for input(s): "AMMONIA" in the last 168 hours. Coagulation Profile: No results for input(s): "INR", "PROTIME" in the last 168 hours. Cardiac Enzymes: No results for input(s): "CKTOTAL", "CKMB", "CKMBINDEX", "TROPONINI" in the last 168 hours. BNP (last 3 results) Recent Labs    04/04/22 1025  PROBNP 13.0   HbA1C: No results for input(s): "HGBA1C" in the last 72 hours. CBG: Recent Labs  Lab 07/30/22 0751 07/30/22 1219 07/30/22 1551 07/31/22 0737 07/31/22 1211  GLUCAP 130* 186* 111* 124* 182*   Lipid Profile: No results for input(s): "CHOL", "HDL", "LDLCALC", "TRIG", "CHOLHDL", "LDLDIRECT" in the last 72 hours. Thyroid Function Tests: No results for input(s): "TSH", "T4TOTAL", "FREET4", "T3FREE", "THYROIDAB" in the last 72 hours. Anemia Panel: No results for input(s): "VITAMINB12", "FOLATE", "FERRITIN", "TIBC", "IRON", "RETICCTPCT" in the last 72 hours. Sepsis Labs: No results for input(s): "PROCALCITON", "LATICACIDVEN" in the last 168 hours.  Recent Results (from the past 240 hour(s))  Urine Culture     Status: None   Collection Time: 07/24/22  9:18 PM   Specimen: Urine, Clean Catch  Result Value Ref Range Status   Specimen Description   Final    URINE, CLEAN CATCH Performed at [facility], [address], [place], [place] [address]    Special Requests   Final    NONE Performed at [facility], [address] [place], [place] [address]     Culture   Final    NO GROWTH Performed at [facility], [place] [address], [place], [place] [address]    Report Status 07/26/2022 FINAL  Final  SARS Coronavirus 2 by RT PCR (hospital order, performed in [facility] hospital lab) *cepheid single result test* Anterior Nasal Swab     Status: None   Collection Time: 07/24/22  9:55 PM   Specimen: Anterior Nasal Swab  Result Value Ref Range Status   SARS Coronavirus 2 by RT PCR NEGATIVE NEGATIVE Final    Comment: (NOTE) SARS-CoV-2 target nucleic acids are NOT DETECTED.  The SARS-CoV-2 RNA is generally detectable in upper and lower respiratory specimens during the acute phase of infection. The lowest concentration of SARS-CoV-2 viral copies this assay can detect is 250 copies / mL. A negative result does not preclude SARS-CoV-2 infection and should not be used as the sole basis for treatment or other patient management decisions.  A negative result may occur with improper specimen collection / handling, submission of specimen other than nasopharyngeal swab, presence of viral mutation(s) within the areas targeted by this assay, and inadequate number of viral copies (<250 copies / mL). A negative result must be combined with clinical observations, patient history, and epidemiological information.  Fact Sheet for Patients:   https://www.patel.info/  Fact Sheet for Healthcare Providers: https://hall.com/  This test is not yet approved or  cleared by the [place] FDA and has been authorized for detection and/or diagnosis of SARS-CoV-2 by FDA under an Emergency Use Authorization (EUA).  This EUA will remain in effect (meaning this test can be used) for the duration of the COVID-19 declaration under Section 564(b)(1) of the Act, 21 U.S.C. section 360bbb-3(b)(1), unless the authorization is terminated or revoked sooner.  Performed at [facility], [address]  [place], [place] [address]   CSF culture w Gram Stain     Status: None  Collection Time: 07/25/22  2:54 PM   Specimen: PATH Cytology CSF; Cerebrospinal Fluid  Result Value Ref Range Status   Specimen Description   Final    CSF Performed at [facility], [address], [place], [place] [address]    Special Requests   Final    NONE Performed at [facility], [address] [place], [place] [address]    Gram Stain   Final    NO ORGANISMS SEEN NO WBC SEEN RED BLOOD CELLS PRESENT Performed at [facility] [address] [place], [place] [address]    Culture   Final    NO GROWTH 3 DAYS Performed at [facility], [place] [address], [place], [place] [address]    Report Status 07/28/2022 FINAL  Final  Urine Culture     Status: None   Collection Time: 07/29/22  6:33 AM   Specimen: Urine, Clean Catch  Result Value Ref Range Status   Specimen Description   Final    URINE, CLEAN CATCH Performed at [facility], [address] [place], [place] [address]    Special Requests   Final    NONE Performed at [facility], [address] [place], [place] [address]    Culture   Final    NO GROWTH Performed at [facility], [place] [address], [place],  [address]    Report Status 07/30/2022 FINAL  Final         Radiology Studies: No results found.      Scheduled Meds:  arformoterol  15 mcg Nebulization BID   And   umeclidinium bromide  1 puff Inhalation Daily   buPROPion  300 mg Oral Q breakfast   busPIRone  45 mg Oral Daily   And   busPIRone  30 mg Oral QHS   clonazePAM  1 mg Oral QHS   cyanocobalamin   Oral Daily   enoxaparin (LOVENOX) injection  40 mg Subcutaneous [id number]   folic acid  1 mg Oral Daily   insulin aspart  0-6 Units Subcutaneous TID WC   lidocaine (PF)  10 mL Intradermal Once   memantine  5 mg Oral BID   rosuvastatin  20 mg Oral QPM   tamsulosin  0.4 mg Oral Daily   Continuous Infusions:   sodium chloride 75 mL/hr at 07/31/22 0644      LOS: 6 days    Time spent: 35 mins    Nolberto Hanlon, MD [facility] Pager [phone]  If 7PM-7AM, please contact night-coverage [email or web address] 07/31/2022, 12:23 PM

## 2022-07-31 NOTE — TOC Progression Note (Addendum)
Transition of Care [facility]) - Progression Note    Patient Details  Name: Amanda Porter MRN: [id number] Date of Birth: 09/13/1946  Transition of Care [facility]) CM/SW Contact  Laurena Slimmer, RN Phone Number: 07/31/2022, 1:25 PM  Clinical Narrative:    Spoke with patient's daughter, [name]. Patient was offered a bed. HUB not updated with bed offer. [name] provided Admission representative, Starr's phone number [facility]. Bed offer extended to patient. Advised Star [name] would have to be obtained. Star will be out of office tomorrow and provided contact number to call [phone].   Expected Discharge Plan: [place] Barriers to Discharge: Continued Medical Work up  Expected Discharge Plan and Services Expected Discharge Plan: [place] Choice: [place] arrangements for the past 2 months: Single Family Home                                       Social Determinants of Health (SDOH) Interventions    Readmission Risk Interventions     No data to display

## 2022-07-31 NOTE — Progress Notes (Signed)
Daughter at bedside with come concerns regarding urinary retention: Patient has had increased urinary retention x2 weeks. Currently on flomax 0.4 daily. Was supposed to see urology outpatient yesterday but had to cancel due to being here. Currently feels like she needs to void but is unable to. Scanned for 294. MD made aware.

## 2022-07-31 NOTE — Progress Notes (Signed)
Physical Therapy Treatment Patient Details Name: TOPANGA ALVELO MRN: [id number] DOB: 1946/11/07 Today's Date: 07/31/2022   History of Present Illness 76 yo female that who came in with AMS, agitation, urinary incontinence, fall, and unsteady gait. Per neurosurgery during admission plan is to perform high-volume lumbar punture. PMH includes CVA, COPD, s/p VP shunt, dementia, TD, and DM.    PT Comments    Pt pleasant and willing to participate with PT. She showed good effort but very much struggled with L neglect/weakness with consistent veering L that she could only partially and briefly "correct" before yet again veering L and consistently running into unmoving obstacles.  Nearly constant cuing with minimal ability to carry over.  Pt with no LOBs but with poor safety and general awareness.   On 2L t/o the effort with sats dropping from high 90s to mid/low 90s with ambulation effort.   Recommendations for follow up therapy are one component of a multi-disciplinary discharge planning process, led by the attending physician.  Recommendations may be updated based on patient status, additional functional criteria and insurance authorization.  Follow Up Recommendations  Skilled nursing-short term rehab (<3 hours/day) Can patient physically be transported by private vehicle: Yes   Assistance Recommended at Discharge Frequent or constant Supervision/Assistance  Patient can return home with the following Assistance with feeding;Assist for transportation;Direct supervision/assist for financial management;Assistance with cooking/housework;Direct supervision/assist for medications management;A little help with walking and/or transfers;A little help with bathing/dressing/bathroom   Equipment Recommendations  None recommended by PT    Recommendations for Other Services       Precautions / Restrictions Precautions Precautions: Fall Restrictions Weight Bearing Restrictions: No     Mobility  Bed  Mobility               General bed mobility comments: in recliner pre and post session    Transfers Overall transfer level: Needs assistance Equipment used: Rolling walker (2 wheels) Transfers: Sit to/from Stand Sit to Stand: Min guard, Min assist           General transfer comment: cues for feet placement, general set up as well as to actually initiate rise to standing.  Pt needed only light physical assist to initiate upward movement and transition to standing    Ambulation/Gait Ambulation/Gait assistance: Min assist Gait Distance (Feet): 60 Feet Assistive device: Rolling walker (2 wheels)         General Gait Details: Pt with consistent veering to the L, unable to maintain L hand appropriately on RW grip with needed to intermittently re-set L UE.  Nearly constant cuing to arrest L veering that she only fleetingly was able to correct as she ran L wheel into stationary obstacles not less than 10X during the ambulation bout - no LOBs but poor awareness and control - she reports this veering as baseline.   Stairs             Wheelchair Mobility    Modified Rankin (Stroke Patients Only)       Balance Overall balance assessment: Needs assistance Sitting-balance support: Feet supported, Bilateral upper extremity supported Sitting balance-Leahy Scale: Good     Standing balance support: Bilateral upper extremity supported, During functional activity Standing balance-Leahy Scale: Poor Standing balance comment: able to maintain balance with walker but consistently leaning/veering L with minimal apparently control or ability to correct even with almost constant cuing  Cognition Arousal/Alertness: Awake/alert Behavior During Therapy: Impulsive, WFL for tasks assessed/performed Overall Cognitive Status: No family/caregiver present to determine baseline cognitive functioning                                  General Comments: Pt able to hold conversation but is inconsistent/distracted much of the time        Exercises      General Comments        Pertinent Vitals/Pain Pain Assessment Pain Assessment: Faces Faces Pain Scale: No hurt    Home Living                          Prior Function            PT Goals (current goals can now be found in the care plan section) Progress towards PT goals: Not progressing toward goals - comment    Frequency    Min 2X/week      PT Plan Current plan remains appropriate    Co-evaluation              AM-PAC PT "6 Clicks" Mobility   Outcome Measure  Help needed turning from your back to your side while in a flat bed without using bedrails?: A Lot Help needed moving from lying on your back to sitting on the side of a flat bed without using bedrails?: A Lot Help needed moving to and from a bed to a chair (including a wheelchair)?: A Little Help needed standing up from a chair using your arms (e.g., wheelchair or bedside chair)?: A Little Help needed to walk in hospital room?: A Lot Help needed climbing 3-5 steps with a railing? : A Lot 6 Click Score: 14    End of Session Equipment Utilized During Treatment: Gait belt Activity Tolerance: Patient tolerated treatment well Patient left: with chair alarm set;with call bell/phone within reach Nurse Communication: Mobility status PT Visit Diagnosis: Muscle weakness (generalized) (M62.81);Difficulty in walking, not elsewhere classified (R26.2)     Time: 1452-1510 PT Time Calculation (min) (ACUTE ONLY): 18 min  Charges:  $Gait Training: 8-22 mins                     Kreg Shropshire, DPT 07/31/2022, 3:45 PM

## 2022-07-31 NOTE — TOC Progression Note (Addendum)
Transition of Care [facility]) - Progression Note    Patient Details  Name: NATACHA JEPSEN MRN: [id number] Date of Birth: December 25, 1945  [facility] CM/SW Contact  Laurena Slimmer, RN Phone Number: 07/31/2022, 10:00 AM  Clinical Narrative:    Spoke with patient's daughter [name] to advise no bed offer had been given. Advised per message in [place] from [facility] facility wanted to discuss copayment days. Riccardo Dubin patient has to be facility free for 60 days for SNF days to be covered. Provided contact information for [name] to call [employer]. [name] prefers [facility].   CM contacted [facility] Admissions. Spokew ith admissions Mudlogger. There are no beds available at this time. [name] made aware.  11:30 Spoke with [name]. She attempted contacting the facility at [phone]. Calls forward to admissions but unable to leave a VM. CM attempted facility with same result. CM contacted facility again and spoke with receptionist to leave Ashley's contact information for admissions.   Expected Discharge Plan: [place] Barriers to Discharge: Continued Medical Work up  Expected Discharge Plan and Services Expected Discharge Plan: [place] Choice: [place] arrangements for the past 2 months: Single Family Home                                       Social Determinants of Health (SDOH) Interventions    Readmission Risk Interventions     No data to display

## 2022-08-01 DIAGNOSIS — R339 Retention of urine, unspecified: Secondary | ICD-10-CM

## 2022-08-01 DIAGNOSIS — G912 (Idiopathic) normal pressure hydrocephalus: Secondary | ICD-10-CM | POA: Diagnosis not present

## 2022-08-01 DIAGNOSIS — R338 Other retention of urine: Secondary | ICD-10-CM

## 2022-08-01 LAB — GLUCOSE, CAPILLARY
Glucose-Capillary: 137 mg/dL — ABNORMAL HIGH (ref 70–99)
Glucose-Capillary: 149 mg/dL — ABNORMAL HIGH (ref 70–99)
Glucose-Capillary: 156 mg/dL — ABNORMAL HIGH (ref 70–99)
Glucose-Capillary: 125 mg/dL — ABNORMAL HIGH (ref 70–99)

## 2022-08-01 LAB — URINALYSIS, ROUTINE W REFLEX MICROSCOPIC
Bilirubin Urine: NEGATIVE
Glucose, UA: NEGATIVE mg/dL
Hgb urine dipstick: NEGATIVE
Ketones, ur: NEGATIVE mg/dL
Leukocytes,Ua: NEGATIVE
Nitrite: NEGATIVE
Protein, ur: NEGATIVE mg/dL
Specific Gravity, Urine: 1.016 (ref 1.005–1.030)
pH: 6 (ref 5.0–8.0)

## 2022-08-01 NOTE — TOC Progression Note (Signed)
Transition of Care [facility]) - Progression Note    Patient Details  Name: Amanda Porter MRN: [id number] Date of Birth: 10/22/1946  [facility] CM/SW Contact  Laurena Slimmer, RN Phone Number: 08/01/2022, 1:47 PM  Clinical Narrative:    [facility] auth approved #[id number] 9/1-9/5.   Facility contacted @ [phone] to confirm if patient could discharge today. No answer. Left a message for Star.  1:52pm Retrieved call from Sidney in admissions at the facility. Patient can be accepted today. Saturday discharge acceptable.   3:11pm MD notified. Contacted patient's daughter to advised her of likely discharge to facility tomorrow.    Expected Discharge Plan: [place] Barriers to Discharge: Continued Medical Work up  Expected Discharge Plan and Services Expected Discharge Plan: [place] Choice: [place] arrangements for the past 2 months: Single Family Home                                       Social Determinants of Health (SDOH) Interventions    Readmission Risk Interventions     No data to display

## 2022-08-01 NOTE — Care Management Important Message (Signed)
Important Message  Patient Details  Name: Amanda Porter MRN: [id number] Date of Birth: 1946-02-07   Medicare Important Message Given:  Yes     Juliann Pulse A Tallin Hart 08/01/2022, 11:33 AM

## 2022-08-01 NOTE — Progress Notes (Signed)
Physical Therapy Treatment Patient Details Name: Amanda Porter MRN: [id number] DOB: 08-01-1946 Today's Date: 08/01/2022   History of Present Illness 76 yo female that who came in with AMS, agitation, urinary incontinence, fall, and unsteady gait. Per neurosurgery during admission plan is to perform high-volume lumbar punture. PMH includes CVA, COPD, s/p VP shunt, dementia, TD, and DM.    PT Comments    Pt presents to PT in bed and agreeable to participate in therapy services. Pt appears to be lethargic today and has difficulty maintaining arousal.  Pt required physical assistance for all aspects of functional mobility. Needs physical assistance during sitting and standing to prevent posterior LOB and minimally responsive to verbal and tactile cueing. Would benefit from skilled PT at SNF to address above deficits in strength, functional mobility, balance, and activity tolerance to promote optimal return to PLOF.   Recommendations for follow up therapy are one component of a multi-disciplinary discharge planning process, led by the attending physician.  Recommendations may be updated based on patient status, additional functional criteria and insurance authorization.  Follow Up Recommendations  Skilled nursing-short term rehab (<3 hours/day) Can patient physically be transported by private vehicle: No   Assistance Recommended at Discharge Frequent or constant Supervision/Assistance  Patient can return home with the following Assistance with feeding;Assist for transportation;Direct supervision/assist for financial management;Assistance with cooking/housework;Direct supervision/assist for medications management;A little help with walking and/or transfers;A little help with bathing/dressing/bathroom   Equipment Recommendations  None recommended by PT    Recommendations for Other Services       Precautions / Restrictions Precautions Precautions: Fall Restrictions Weight Bearing Restrictions: No      Mobility  Bed Mobility Overal bed mobility: Needs Assistance Bed Mobility: Supine to Sit, Sit to Supine     Supine to sit: Max assist Sit to supine: Max assist   General bed mobility comments: R shoulder guarding    Transfers Overall transfer level: Needs assistance Equipment used: Rolling walker (2 wheels) Transfers: Sit to/from Stand Sit to Stand: Max assist           General transfer comment: required physial assistance to remain upright during standing to prevent posterior LOB    Ambulation/Gait Ambulation/Gait assistance: Max assist Gait Distance (Feet): 2 Feet Assistive device: Rolling walker (2 wheels) Gait Pattern/deviations: Step-to pattern, Shuffle, Trunk flexed Gait velocity: decreased     General Gait Details: unable to properly sequence side steps towards [facility] w tactile and verbal cueing, required physical assistance for proper foot placement   Stairs             Wheelchair Mobility    Modified Rankin (Stroke Patients Only)       Balance Overall balance assessment: Needs assistance Sitting-balance support: Feet supported, Bilateral upper extremity supported Sitting balance-Leahy Scale: Poor Sitting balance - Comments: requires physical assistance to remain upright Postural control: Posterior lean, Right lateral lean Standing balance support: Bilateral upper extremity supported, During functional activity, Reliant on assistive device for balance Standing balance-Leahy Scale: Poor Standing balance comment: unable to remain upright without physical assistance                            Cognition Arousal/Alertness: Lethargic Behavior During Therapy:  (lethargic) Overall Cognitive Status: No family/caregiver present to determine baseline cognitive functioning  General Comments: Pt appears to be lethargic and unable to follow commands during 9/1 tx session        Exercises   Anterior weight shifting activities in sitting    General Comments        Pertinent Vitals/Pain Pain Assessment Pain Assessment: No/denies pain    Home Living                          Prior Function            PT Goals (current goals can now be found in the care plan section) Acute Rehab PT Goals Patient Stated Goal: to go home PT Goal Formulation: With patient Time For Goal Achievement: 08/08/22 Potential to Achieve Goals: Good Progress towards PT goals: Not progressing toward goals - comment    Frequency    Min 2X/week      PT Plan Current plan remains appropriate    Co-evaluation              AM-PAC PT "6 Clicks" Mobility   Outcome Measure  Help needed turning from your back to your side while in a flat bed without using bedrails?: A Lot Help needed moving from lying on your back to sitting on the side of a flat bed without using bedrails?: A Lot Help needed moving to and from a bed to a chair (including a wheelchair)?: A Lot Help needed standing up from a chair using your arms (e.g., wheelchair or bedside chair)?: A Lot Help needed to walk in hospital room?: A Lot Help needed climbing 3-5 steps with a railing? : Total 6 Click Score: 11    End of Session Equipment Utilized During Treatment: Gait belt;Oxygen Activity Tolerance: Patient limited by lethargy Patient left: in bed;with call bell/phone within reach;with bed alarm set Nurse Communication: Mobility status PT Visit Diagnosis: Muscle weakness (generalized) (M62.81);Difficulty in walking, not elsewhere classified (R26.2)     Time: 8295-6213 PT Time Calculation (min) (ACUTE ONLY): 31 min  Charges:                        Glenice Laine MPH, SPT 08/01/22, 11:57 AM

## 2022-08-01 NOTE — Progress Notes (Signed)
PROGRESS NOTE   HPI was taken from Dr. Sidney Ace: Amanda Porter is a 76 y.o. African-American female with medical history significant for COPD, CVA, depression, dementia, type 2 diabetes mellitus, dysphagia, dyslipidemia, normal pressure hydrocephalus s/p VP shunt, who presented to the ER with acute onset of altered mental status today as well as agitation over the last 6 days and urinary incontinence over the last 4 to 5 days with unsteady gait likely.  She fell about a week ago and was having left elbow pain.  No reported fever or chills.  No nausea or vomiting or abdominal pain.  She has been having urinary frequency and urgency and was recently treated with p.o. Keflex for UTI that she is finishing soon.  No cough or wheezing or dyspnea.  No new paresthesias or focal muscle weakness.   ED Course: When she came to the ER, vital signs were within normal and later BP was 142/93.  Labs revealed blood gases with pH 7.48 and HCO3 of 35.7, PA CO2 of 48 and PO2 of 66 with O2 sat of 94.9%.  CMP showed potassium of 3.3 with CO2 of 34 and CBC showed anemia close to baseline.   Imaging: 1 view abdomen x-ray showed intact right frontal ventriculoperitoneal shunt, cholelithiasis and normal abdominal gas pattern.  C-spine x-ray showed the same with a shunt series as well as skull x-ray.  Portable chest ray showed no acute cardiopulmonary disease and did show emphysema left elbow x-ray was negative.  Noncontrasted CT scan showed progressive ventriculomegaly when compared to prior examination on 04/17/2022 with stable right frontal ventriculostomy catheter and stable advanced senescent changes and remote right thalamic infarct with no acute intracranial abnormalities.   The patient was given 1 g of p.o. Tylenol.  Dr. [name] was notified about the patient and recommended shunt series.  She will be admitted to a medical telemetry bed for further evaluation and management.  As per Dr. Jimmye Norman 8/25-8/29/23: Pt presented  w/ altered mental status from baseline. CT head showed progressive ventriculomegaly when compared to prior examine on 04/17/22, stable right frontal ventriculostomy catheter, stable advanced senescent changes & remote right thalamic infarct & no acute intracranial hemorrhage or infarct. Neuro surg was consulted & following pt. Pt is s/p LP and PT eval on 07/25/22. Pt still only remains oriented to person and place. It was attempted for a second large volume LP x2 but unfortunately IR was unable to obtain any CSF. No shunt revision at this time as per neuro surg.   8/31 more alert this am. Has no complaints. 9/1 more interactive with me today. No complaints.    Amanda Porter  KDX:[id number] DOB: 01-07-1946 DOA: 07/24/2022 PCP: Pleas Koch, NP    Assessment & Plan:   Principal Problem:   NPH (normal pressure hydrocephalus) (HCC) Active Problems:   Hypokalemia   Dementia without behavioral disturbance (HCC)   Anxiety and depression   Hyperlipidemia   Normocytic anemia   Acute urinary retention  Assessment and Plan: NPH: presenting with acute metabolic encephalopathy, ataxia and urinary incontinence concerning about her worsening hydrocephalus & ventriculomegaly. Imaging showing intact ventriculoperitoneal shunt. S/p LP & PT eval on 07/25/22. Oriented to person and place only. No shunt revision at this time as per neuro surg.  Large volume LP attempted x 2 but IR unable to obtain CSF. 8/30 neurosurgery following.  Discussed potential for shunt revision electively.  They will make an appointment for patient.   -9/1 continue with gentle hydration, decrease rate  to 63ml/hr Encouraged po intake   Acute urinary retention Urology was consulted Bladder scan q4hrs, I/o if scan shows 250 or more Urology's input was appreciated check UA from I/O cath  Continue Flomax Outpatient urology follow-up in 4 weeks Continue bladder scan to be continued on discharge to SNF   Hypokalemia-stable    Dementia: Continue Namenda Haldol as needed for agitation   Depression: severity unknown.  Continue with Wellbutrin and BuSpar     DM2: HbA1c 7.2, poorly controlled. Continue on SSI w/ accuchecks  Anxiety: severity unknown. Continue on home dose of klonopin   HLD: continue on statin   Normocytic anemia: H&H are labile. No need for a transfusion currently       DVT prophylaxis: lovenox  Code Status:   Family Communication:  full   Disposition Plan: SNF bed pending  Level of care: Med-Surg  Status is: Inpatient Remains inpatient appropriate because: severity of illness   Consultants:  Neuro surg   Procedures:   Antimicrobials:   Subjective: Denies dysuria or shortness of breath.  Denies abdominal pain.  Worked with PT this a.m.  Objective: Vitals:   08/01/22 0513 08/01/22 0758 08/01/22 0833 08/01/22 1252  BP: (!) 125/53 (!) 147/70  (!) 140/68  Pulse: 83 84  98  Resp: 18 18  20   Temp: 98.1 F (36.7 C) 98.2 F (36.8 C)    TempSrc: Oral     SpO2: 100% 100% 98% 95%  Weight:      Height:        Intake/Output Summary (Last 24 hours) at 08/01/2022 1425 Last data filed at 08/01/2022 0000 Gross per 24 hour  Intake --  Output 900 ml  Net -900 ml    Filed Weights   07/24/22 1810  Weight: 49 kg    Examination: Calm, NAD Cta no w/r Reg s1/s2 no gallop Soft benign +bs No edema Aaoxox3 (knows her birth date but not present year)  Mood and affect appropriate in current setting    Data Reviewed: I have personally reviewed following labs and imaging studies  CBC: Recent Labs  Lab 07/26/22 0456 07/27/22 0511 07/28/22 0410 07/29/22 0518 07/30/22 0438  WBC 7.9 7.2 9.2 9.1 6.5  HGB 9.5* 9.6* 9.4* 9.1* 9.8*  HCT 30.3* 30.5* 30.6* 29.9* 32.1*  MCV 90.7 90.0 90.0 90.1 89.9  PLT 266 286 281 269 381   Basic Metabolic Panel: Recent Labs  Lab 07/26/22 0456 07/27/22 0511 07/28/22 0410 07/29/22 0518 07/30/22 0438  NA 141 143 142 141 142  K 3.5  3.4* 3.4* 3.8 4.1  CL 107 107 106 107 106  CO2 29 30 28 30 30   GLUCOSE 96 134* 123* 134* 108*  BUN 9 11 21 16 15   CREATININE 0.56 1.08* 0.82 0.65 0.59  CALCIUM 8.7* 9.1 9.2 9.1 9.2  MG  --   --   --  2.1 2.2   GFR: Estimated Creatinine Clearance: 45.6 mL/min (by C-G formula based on SCr of 0.59 mg/dL). Liver Function Tests: No results for input(s): "AST", "ALT", "ALKPHOS", "BILITOT", "PROT", "ALBUMIN" in the last 168 hours.  No results for input(s): "LIPASE", "AMYLASE" in the last 168 hours. No results for input(s): "AMMONIA" in the last 168 hours. Coagulation Profile: No results for input(s): "INR", "PROTIME" in the last 168 hours. Cardiac Enzymes: No results for input(s): "CKTOTAL", "CKMB", "CKMBINDEX", "TROPONINI" in the last 168 hours. BNP (last 3 results) Recent Labs    04/04/22 1025  PROBNP 13.0   HbA1C: No results for  input(s): "HGBA1C" in the last 72 hours. CBG: Recent Labs  Lab 07/31/22 1211 07/31/22 1604 07/31/22 2017 08/01/22 0756 08/01/22 1148  GLUCAP 182* 115* 152* 137* 149*   Lipid Profile: No results for input(s): "CHOL", "HDL", "LDLCALC", "TRIG", "CHOLHDL", "LDLDIRECT" in the last 72 hours. Thyroid Function Tests: No results for input(s): "TSH", "T4TOTAL", "FREET4", "T3FREE", "THYROIDAB" in the last 72 hours. Anemia Panel: No results for input(s): "VITAMINB12", "FOLATE", "FERRITIN", "TIBC", "IRON", "RETICCTPCT" in the last 72 hours. Sepsis Labs: No results for input(s): "PROCALCITON", "LATICACIDVEN" in the last 168 hours.  Recent Results (from the past 240 hour(s))  Urine Culture     Status: None   Collection Time: 07/24/22  9:18 PM   Specimen: Urine, Clean Catch  Result Value Ref Range Status   Specimen Description   Final    URINE, CLEAN CATCH Performed at [facility], [address] [place], [place] [address]    Special Requests   Final    NONE Performed at [facility], [address], [place], [place] [address]     Culture   Final    NO GROWTH Performed at [facility], [place] [address] [place], [place] [address]    Report Status 07/26/2022 FINAL  Final  SARS Coronavirus 2 by RT PCR (hospital order, performed in [facility] hospital lab) *cepheid single result test* Anterior Nasal Swab     Status: None   Collection Time: 07/24/22  9:55 PM   Specimen: Anterior Nasal Swab  Result Value Ref Range Status   SARS Coronavirus 2 by RT PCR NEGATIVE NEGATIVE Final    Comment: (NOTE) SARS-CoV-2 target nucleic acids are NOT DETECTED.  The SARS-CoV-2 RNA is generally detectable in upper and lower respiratory specimens during the acute phase of infection. The lowest concentration of SARS-CoV-2 viral copies this assay can detect is 250 copies / mL. A negative result does not preclude SARS-CoV-2 infection and should not be used as the sole basis for treatment or other patient management decisions.  A negative result may occur with improper specimen collection / handling, submission of specimen other than nasopharyngeal swab, presence of viral mutation(s) within the areas targeted by this assay, and inadequate number of viral copies (<250 copies / mL). A negative result must be combined with clinical observations, patient history, and epidemiological information.  Fact Sheet for Patients:   https://www.patel.info/  Fact Sheet for Healthcare Providers: https://hall.com/  This test is not yet approved or  cleared by the [place] FDA and has been authorized for detection and/or diagnosis of SARS-CoV-2 by FDA under an Emergency Use Authorization (EUA).  This EUA will remain in effect (meaning this test can be used) for the duration of the COVID-19 declaration under Section 564(b)(1) of the Act, 21 U.S.C. section 360bbb-3(b)(1), unless the authorization is terminated or revoked sooner.  Performed at [facility], [address]  [place], [place] [address]   CSF culture w Gram Stain     Status: None   Collection Time: 07/25/22  2:54 PM   Specimen: PATH Cytology CSF; Cerebrospinal Fluid  Result Value Ref Range Status   Specimen Description   Final    CSF Performed at [facility], [address] [place], [place] [address]    Special Requests   Final    NONE Performed at [facility], [place], [place] [address]    Gram Stain   Final    NO ORGANISMS SEEN NO WBC SEEN RED  BLOOD CELLS PRESENT Performed at [facility], [address] [place], [place] [address]    Culture   Final    NO GROWTH 3 DAYS Performed at [facility], [place] [address] [place], [place] [address]    Report Status 07/28/2022 FINAL  Final  Urine Culture     Status: None   Collection Time: 07/29/22  6:33 AM   Specimen: Urine, Clean Catch  Result Value Ref Range Status   Specimen Description   Final    URINE, CLEAN CATCH Performed at [facility], [address], [place], [place] [address]    Special Requests   Final    NONE Performed at [facility], [address] [place], [place] [address]    Culture   Final    NO GROWTH Performed at [facility], [place] [address], [place],  [address]    Report Status 07/30/2022 FINAL  Final         Radiology Studies: No results found.      Scheduled Meds:  arformoterol  15 mcg Nebulization BID   And   umeclidinium bromide  1 puff Inhalation Daily   buPROPion  300 mg Oral Q breakfast   busPIRone  45 mg Oral Daily   And   busPIRone  30 mg Oral QHS   clonazePAM  1 mg Oral QHS   cyanocobalamin   Oral Daily   enoxaparin (LOVENOX) injection  40 mg Subcutaneous C37S   folic acid  1 mg Oral Daily   insulin aspart  0-6 Units Subcutaneous TID WC   lidocaine (PF)  10 mL Intradermal Once   memantine  5 mg Oral BID   rosuvastatin  20 mg Oral QPM   tamsulosin  0.4 mg Oral Daily   Continuous Infusions:   sodium chloride 75 mL/hr at 08/01/22 0828      LOS: 7 days    Time spent: 35 mins    Nolberto Hanlon, MD [facility] Pager [phone]  If 7PM-7AM, please contact night-coverage [email or web address] 08/01/2022, 2:25 PM

## 2022-08-01 NOTE — TOC Progression Note (Signed)
fTransition of Care [facility]) - Progression Note    Patient Details  Name: Amanda Porter MRN: [id number] Date of Birth: October 24, 1946  [facility] CM/SW Contact  Laurena Slimmer, RN Phone Number: 08/01/2022, 9:00 AM  Clinical Narrative:    Candace Cruise started and is pending approval.    Expected Discharge Plan: [place] Barriers to Discharge: Continued Medical Work up  Expected Discharge Plan and Services Expected Discharge Plan: [place] arrangements for the past 2 months: Single Family Home                                       Social Determinants of Health (SDOH) Interventions    Readmission Risk Interventions     No data to display

## 2022-08-01 NOTE — Consult Note (Cosign Needed Addendum)
Urology Consult  I have been asked to see the patient by Dr. Kurtis Bushman, for evaluation and management of urinary retention.  Chief Complaint: Inability to urinate, recurrent UTIs  History of Present Illness: Amanda Porter is a 76 y.o. year old female with PMH dementia, DM2, CVA, normal pressure hydrocephalus s/p VP shunt currently admitted with AMS, agitation, fall, and urinary incontinence and found to have worsening hydrocephalus and ventriculomegaly. She has failed 2 attempted LPs and has been recommended to undergo elective shunt revision on an outpatient basis with Dr. [name].  Admission UA benign; urine culture finalized with no growth. Repeat urine culture dated 07/29/2022 also with no growth. Creatinine has been downtrending since admission, now 0.59. No formal bladder or renal imaging since admission.  Patient reported the urge to void without the ability to do so yesterday; bladder scan with 231mL and she was I&O cathed by nursing with 549mL of urine. Nursing reported that her urine was dark yellow and malodorous with urinary sediment. UA and culture have not been repeated.  Patient is unable to contribute to HPI this morning due to somnolence and confusion. I spoke with the patient's daughter, Amanda Porter, via telephone. She reports she is a Runner, broadcasting/film/video and her mother typically lives at home with her, but goes to adult daycare. She anticipates the patient will be discharged to a SNF but hopes to ultimately bring her back home.  Amanda Porter reports an approximate 62-month history of intermittent difficulty voiding and "recurrent UTIs," with typical symptoms including confusion, disorientation, hallucinations, and nocturnal enuresis. She feels these two issues are related. She admits that her mother's UAs are frequently bland and her urine cultures do not always finalize with growth. They have been treated with oral antibiotics during hospitalizations, by SNF staff, and by her PCP at [facility]. Urine culture  history is summarized below. Notably, she was on Keflex for possible UTI on admission here and Amanda Porter reports no improvement in her mother's symptoms despite antibiotics.  Regarding her difficulty voiding, her mother will attempt to void several times in a row and can typically only initiate a stream upon the 3rd or 4th attempt. She was started on Flomax last December while admitted at [facility]; unclear if this has been helpful but she is now taking it at bedtime and tolerating it well. Due to her recent agitation, she is concerned her mother will not tolerate a Foley without attempting to pull it.  They were scheduled to establish care at [facility] last week, but had to cancel that appointment due to her hospitalization. Overall, Amanda Porter is concerned that her mother's cognitive symptoms represent an acute rather than gradual change, so she feels they are not attributable entirely to VP shunt failure.  Urine culture history: 11/08/2021 Enterococcus faecalis (pan-sensitive, microscopic hematuria noted at this time that has since cleared and not recurred) 04/04/2022 No growth 04/22/2022 Group B Streptococcus agalactiae (20k colonies/mL) 06/06/2022 Pseudomonas aeruginosa (pan-sensitive, 50-100k CFU/mL) 07/24/2022 No growth 07/29/2022 No growth  Past Medical History:  Diagnosis Date   Acute respiratory failure requiring reintubation ([place]) 11/05/2021   Anxiety    Anxiety and depression    COPD (chronic obstructive pulmonary disease) ([facility])    CVA (cerebral vascular accident) ([place])    CVA (cerebral vascular accident) ([place])    L sided deficits   Dementia ([place])    Depression    Diabetes mellitus ([place])    Type 2   Diabetes mellitus without complication ([place])    Dysphagia  Dysphasia    History of lung cancer    Hyperlipidemia    Influenza A 12/13/2021   Memory disturbance 11/20/2017   Normal pressure hydrocephalus ([place]) 2018   Retinal detachment    Right   Right carotid bruit  11/20/2017   Tardive dyskinesia 02/01/2020   Tardive dyskinesia    Thrombosis    Arterial to lower extremity?    Past Surgical History:  Procedure Laterality Date   IR FL GUIDED LOC OF NEEDLE/CATH TIP FOR SPINAL INJECTION RT  07/25/2022   LUNG REMOVAL, PARTIAL     left upper lobe   VENTRICULOPERITONEAL SHUNT  12/09/2016   VENTRICULOPERITONEAL SHUNT      Home Medications:  Current Meds  Medication Sig   Accu-Chek Softclix Lancets lancets Use as instructed   buPROPion (WELLBUTRIN XL) 150 MG 24 hr tablet 1 qam for 5 days then 2  qam (Patient taking differently: Take 300 mg by mouth daily with breakfast.)   busPIRone (BUSPAR) 15 MG tablet 3  qam   2 qpm (Patient taking differently: Take 30-45 mg by mouth See admin instructions. Takes 45 mg (3 tabs) in AM and 30mg  (2 tabs) at bedtime)   [EXPIRED] cephALEXin (KEFLEX) 500 MG capsule Take 1 capsule (500 mg total) by mouth 2 (two) times daily for 7 days.   clonazePAM (KLONOPIN) 1 MG tablet 1 qhs (Patient taking differently: Take 1 mg by mouth at bedtime.)   Cyanocobalamin (B-12 PO) Take 1 capsule by mouth daily.   folic acid (FOLVITE) 1 MG tablet Take 1 tablet (1 mg total) by mouth daily.   glipiZIDE (GLUCOTROL XL) 2.5 MG 24 hr tablet TAKE 1 TABLET (2.5 MG TOTAL) BY MOUTH DAILY WITH BREAKFAST. FOR DIABETES.   memantine (NAMENDA) 5 MG tablet Take 1 tablet (5 mg total) by mouth 2 (two) times daily. For memory.   OXYGEN 3 L continuous.    rosuvastatin (CRESTOR) 20 MG tablet TAKE 1 TABLET (20 MG TOTAL) BY MOUTH EVERY EVENING. FOR CHOLESTEROL.   tamsulosin (FLOMAX) 0.4 MG CAPS capsule Take 1 capsule (0.4 mg total) by mouth daily. For urine retention   Tiotropium Bromide-Olodaterol (STIOLTO RESPIMAT) 2.5-2.5 MCG/ACT AERS INHALE 2 PUFFS BY MOUTH INTO THE LUNGS DAILY (Patient taking differently: Inhale 2 puffs into the lungs daily.)    Allergies:  Allergies  Allergen Reactions   Lexapro [Escitalopram Oxalate] Other (See Comments)    Dizziness,  nausea, fatigue   Neosporin  [Neomycin-Polymyxin-Gramicidin] Rash    (Neosporin)   Neosporin [Bacitracin-Polymyxin B] Rash    Family History  Problem Relation Age of Onset   Pneumonia Mother    Cancer Father    Diabetes Sister    Diabetes Sister    Breast cancer Neg Hx     Social History:  reports that she quit smoking about 5 years ago. Her smoking use included cigarettes. She has a 25.00 pack-year smoking history. She has never used smokeless tobacco. She reports that she does not currently use alcohol. She reports that she does not currently use drugs.  ROS: A complete review of systems was performed.  All systems are negative except for pertinent findings as noted.  Physical Exam:  Vital signs in last 24 hours: Temp:  [98.1 F (36.7 C)-99.3 F (37.4 C)] 98.2 F (36.8 C) (09/01 0758) Pulse Rate:  [83-92] 84 (09/01 0758) Resp:  [16-18] 18 (09/01 0758) BP: (125-154)/(53-70) 147/70 (09/01 0758) SpO2:  [98 %-100 %] 100 % (09/01 0758) Constitutional:  Sleeping but briefly arousable, no acute distress  HEENT: [place] AT, moist mucus membranes Cardiovascular: No clubbing, cyanosis, or edema Respiratory: Normal respiratory effort Skin: No rashes, bruises or suspicious lesions Neurologic: Grossly intact, no focal deficits, moving all 4 extremities Psychiatric: Normal mood and affect  Laboratory Data:  Recent Labs    07/30/22 0438  WBC 6.5  HGB 9.8*  HCT 32.1*   Recent Labs    07/30/22 0438  NA 142  K 4.1  CL 106  CO2 30  GLUCOSE 108*  BUN 15  CREATININE 0.59  CALCIUM 9.2   Urinalysis    Component Value Date/Time   COLORURINE YELLOW (A) 07/24/2022 2118   APPEARANCEUR CLEAR (A) 07/24/2022 2118   APPEARANCEUR Clear 05/24/2019 0823   LABSPEC 1.024 07/24/2022 2118   PHURINE 5.0 07/24/2022 2118   GLUCOSEU NEGATIVE 07/24/2022 2118   HGBUR NEGATIVE 07/24/2022 2118   BILIRUBINUR NEGATIVE 07/24/2022 2118   BILIRUBINUR negative 06/06/2022 1543   BILIRUBINUR Negative  04/04/2022 1437   BILIRUBINUR Negative 05/24/2019 [place] 07/24/2022 2118   PROTEINUR 30 (A) 07/24/2022 2118   UROBILINOGEN 0.2 06/06/2022 1543   NITRITE NEGATIVE 07/24/2022 2118   LEUKOCYTESUR NEGATIVE 07/24/2022 2118   Results for orders placed or performed during the hospital encounter of 07/24/22  Urine Culture     Status: None   Collection Time: 07/24/22  9:18 PM   Specimen: Urine, Clean Catch  Result Value Ref Range Status   Specimen Description   Final    URINE, CLEAN CATCH Performed at [facility], [address] [place], [place] [address]    Special Requests   Final    NONE Performed at [facility], [address] [place], [place] [address]    Culture   Final    NO GROWTH Performed at [facility], [place] [address], [place], [place] [address]    Report Status 07/26/2022 FINAL  Final  SARS Coronavirus 2 by RT PCR (hospital order, performed in [facility] hospital lab) *cepheid single result test* Anterior Nasal Swab     Status: None   Collection Time: 07/24/22  9:55 PM   Specimen: Anterior Nasal Swab  Result Value Ref Range Status   SARS Coronavirus 2 by RT PCR NEGATIVE NEGATIVE Final    Comment: (NOTE) SARS-CoV-2 target nucleic acids are NOT DETECTED.  The SARS-CoV-2 RNA is generally detectable in upper and lower respiratory specimens during the acute phase of infection. The lowest concentration of SARS-CoV-2 viral copies this assay can detect is 250 copies / mL. A negative result does not preclude SARS-CoV-2 infection and should not be used as the sole basis for treatment or other patient management decisions.  A negative result may occur with improper specimen collection / handling, submission of specimen other than nasopharyngeal swab, presence of viral mutation(s) within the areas targeted by this assay, and inadequate number of viral copies (<250 copies / mL). A negative result must be combined with  clinical observations, patient history, and epidemiological information.  Fact Sheet for Patients:   https://www.patel.info/  Fact Sheet for Healthcare Providers: https://hall.com/  This test is not yet approved or  cleared by the [place] FDA and has been authorized for detection and/or diagnosis of SARS-CoV-2 by FDA under an Emergency Use Authorization (EUA).  This EUA will remain in effect (meaning this test can be used) for the duration of the COVID-19 declaration under Section 564(b)(1) of the Act, 21 U.S.C. section 360bbb-3(b)(1), unless the authorization is terminated or revoked sooner.  Performed at  [place], [place] [address]   CSF culture w Gram Stain     Status: None   Collection Time: 07/25/22  2:54 PM   Specimen: PATH Cytology CSF; Cerebrospinal Fluid  Result Value Ref Range Status   Specimen Description   Final    CSF Performed at [facility], [address] [place], [place] [address]    Special Requests   Final    NONE Performed at [facility], [address] [place], [place] [address]    Gram Stain   Final    NO ORGANISMS SEEN NO WBC SEEN RED BLOOD CELLS PRESENT Performed at [facility], [address], [place], [place] [address]    Culture   Final    NO GROWTH 3 DAYS Performed at [facility], [place] [address], [place], [place] [address]    Report Status 07/28/2022 FINAL  Final  Urine Culture     Status: None   Collection Time: 07/29/22  6:33 AM   Specimen: Urine, Clean Catch  Result Value Ref Range Status   Specimen Description   Final    URINE, CLEAN CATCH Performed at [facility], [address], [place], [place] [address]    Special Requests   Final    NONE Performed at [facility], [address] [place], [place] [address]    Culture   Final    NO GROWTH Performed at [facility],  [place] [address], [place], [place] [address]    Report Status 07/30/2022 FINAL  Final   Assessment & Plan:  76 year old female with PMH dementia, DM2, CVA, normal pressure hydrocephalus s/p VP shunt with active shunt malfunction awaiting outpatient revision, difficulty voiding, and possible recurrent UTIs admitted with AMS, agitation, falls, and increased urinary incontinence consistent with worsening normal pressure hydrocephalus.  Per chart review, I only see 1-2 positive urine cultures within the past year, with a third growing low colony counts of bacteria consistent with sample contamination.  She has had 3 negative urine cultures within the past year.  I had a very frank conversation with the patient's daughter, in which I explained that I think her mother's constellation of dementia, stroke, and worsening normal pressure hydrocephalus are contributing to her current cognitive symptoms and in the absence of a positive urine culture, they do not represent UTI.  It is important for the future that we do not over treat her with antibiotics for cognitive symptoms that are attributable to other sources.  That said, I agree that she does appear to have an element of incomplete bladder emptying versus chronic urinary retention and this is likely multifactorial due to her CVA, DM2, and current debility.  Due to concerns for patient possibly pulling a Foley catheter, I recommend continuing bladder scans every 4 hours with plans for I&O catheterization if reading is >266mL.  Okay to repeat a UA and culture due to concerns for possibly infected urine with I&O catheterization yesterday.  They wish to establish urologic care in our clinic and I will arrange this.  I will plan to see them in approximately 1 month.  If the patient's cognitive status improves following shunt revision, it is possible we can convert her to a Foley catheter in the future.  Recommendations: -Continue Flomax -Bladder scan every 4 hours  and I&O cath if >222mL, to be continued upon discharge to SNF. If SNF does not have bladder scanner, recommend standing I&O catheterization  every 8 hours. -Repeat UA and culture today with catheterized urine sample -Outpatient urology follow-up in 4 weeks  Thank you for involving me in this patient's care, please page with any further questions or concerns.  Debroah Loop, PA-C 08/01/2022 8:22 AM

## 2022-08-02 DIAGNOSIS — R5381 Other malaise: Secondary | ICD-10-CM | POA: Diagnosis not present

## 2022-08-02 DIAGNOSIS — M6281 Muscle weakness (generalized): Secondary | ICD-10-CM | POA: Diagnosis not present

## 2022-08-02 DIAGNOSIS — R269 Unspecified abnormalities of gait and mobility: Secondary | ICD-10-CM | POA: Diagnosis not present

## 2022-08-02 DIAGNOSIS — F0390 Unspecified dementia without behavioral disturbance: Secondary | ICD-10-CM | POA: Diagnosis not present

## 2022-08-02 DIAGNOSIS — F0393 Unspecified dementia, unspecified severity, with mood disturbance: Secondary | ICD-10-CM | POA: Diagnosis not present

## 2022-08-02 DIAGNOSIS — F331 Major depressive disorder, recurrent, moderate: Secondary | ICD-10-CM | POA: Diagnosis not present

## 2022-08-02 DIAGNOSIS — F32A Depression, unspecified: Secondary | ICD-10-CM | POA: Diagnosis not present

## 2022-08-02 DIAGNOSIS — F03B18 Unspecified dementia, moderate, with other behavioral disturbance: Secondary | ICD-10-CM | POA: Diagnosis not present

## 2022-08-02 DIAGNOSIS — E119 Type 2 diabetes mellitus without complications: Secondary | ICD-10-CM | POA: Diagnosis not present

## 2022-08-02 DIAGNOSIS — Z9181 History of falling: Secondary | ICD-10-CM | POA: Diagnosis not present

## 2022-08-02 DIAGNOSIS — R2689 Other abnormalities of gait and mobility: Secondary | ICD-10-CM | POA: Diagnosis not present

## 2022-08-02 DIAGNOSIS — M60812 Other myositis, left shoulder: Secondary | ICD-10-CM | POA: Diagnosis not present

## 2022-08-02 DIAGNOSIS — G912 (Idiopathic) normal pressure hydrocephalus: Secondary | ICD-10-CM | POA: Diagnosis not present

## 2022-08-02 DIAGNOSIS — J9611 Chronic respiratory failure with hypoxia: Secondary | ICD-10-CM | POA: Diagnosis not present

## 2022-08-02 DIAGNOSIS — R1310 Dysphagia, unspecified: Secondary | ICD-10-CM | POA: Diagnosis not present

## 2022-08-02 DIAGNOSIS — R262 Difficulty in walking, not elsewhere classified: Secondary | ICD-10-CM | POA: Diagnosis not present

## 2022-08-02 DIAGNOSIS — E1151 Type 2 diabetes mellitus with diabetic peripheral angiopathy without gangrene: Secondary | ICD-10-CM | POA: Diagnosis not present

## 2022-08-02 DIAGNOSIS — I639 Cerebral infarction, unspecified: Secondary | ICD-10-CM | POA: Diagnosis not present

## 2022-08-02 DIAGNOSIS — F411 Generalized anxiety disorder: Secondary | ICD-10-CM | POA: Diagnosis not present

## 2022-08-02 DIAGNOSIS — Z982 Presence of cerebrospinal fluid drainage device: Secondary | ICD-10-CM | POA: Diagnosis not present

## 2022-08-02 DIAGNOSIS — Z01818 Encounter for other preprocedural examination: Secondary | ICD-10-CM | POA: Diagnosis not present

## 2022-08-02 DIAGNOSIS — R1311 Dysphagia, oral phase: Secondary | ICD-10-CM | POA: Diagnosis not present

## 2022-08-02 DIAGNOSIS — J449 Chronic obstructive pulmonary disease, unspecified: Secondary | ICD-10-CM | POA: Diagnosis not present

## 2022-08-02 DIAGNOSIS — R34 Anuria and oliguria: Secondary | ICD-10-CM | POA: Diagnosis not present

## 2022-08-02 DIAGNOSIS — T85618A Breakdown (mechanical) of other specified internal prosthetic devices, implants and grafts, initial encounter: Secondary | ICD-10-CM | POA: Diagnosis not present

## 2022-08-02 DIAGNOSIS — G8929 Other chronic pain: Secondary | ICD-10-CM | POA: Diagnosis not present

## 2022-08-02 DIAGNOSIS — D649 Anemia, unspecified: Secondary | ICD-10-CM | POA: Diagnosis not present

## 2022-08-02 DIAGNOSIS — I69391 Dysphagia following cerebral infarction: Secondary | ICD-10-CM | POA: Diagnosis not present

## 2022-08-02 DIAGNOSIS — R41841 Cognitive communication deficit: Secondary | ICD-10-CM | POA: Diagnosis not present

## 2022-08-02 DIAGNOSIS — R509 Fever, unspecified: Secondary | ICD-10-CM | POA: Diagnosis not present

## 2022-08-02 DIAGNOSIS — I10 Essential (primary) hypertension: Secondary | ICD-10-CM | POA: Diagnosis not present

## 2022-08-02 DIAGNOSIS — R1312 Dysphagia, oropharyngeal phase: Secondary | ICD-10-CM | POA: Diagnosis not present

## 2022-08-02 DIAGNOSIS — R2681 Unsteadiness on feet: Secondary | ICD-10-CM | POA: Diagnosis not present

## 2022-08-02 LAB — GLUCOSE, CAPILLARY: Glucose-Capillary: 139 mg/dL — ABNORMAL HIGH (ref 70–99)

## 2022-08-02 MED ORDER — AMLODIPINE BESYLATE 2.5 MG PO TABS: 2.5000 mg | ORAL_TABLET | Freq: Every day | ORAL | Status: DC

## 2022-08-02 MED ORDER — BUSPIRONE HCL 30 MG PO TABS: 30.0000 mg | ORAL_TABLET | Freq: Every day | ORAL | Status: DC

## 2022-08-02 MED ORDER — CLONAZEPAM 1 MG PO TABS: 1.0000 mg | ORAL_TABLET | Freq: Every day | ORAL | 0 refills | Status: DC

## 2022-08-02 MED ORDER — BUSPIRONE HCL 15 MG PO TABS: 45.0000 mg | ORAL_TABLET | Freq: Every day | ORAL | Status: DC

## 2022-08-02 MED ORDER — BUPROPION HCL ER (XL) 300 MG PO TB24: 300.0000 mg | ORAL_TABLET | Freq: Every day | ORAL | Status: DC

## 2022-08-02 MED ORDER — AMLODIPINE BESYLATE 5 MG PO TABS
2.5000 mg | ORAL_TABLET | Freq: Every day | ORAL | Status: DC
  Administered 2022-08-02: 2.5 mg via ORAL
  Filled 2022-08-02: qty 1

## 2022-08-02 MED ORDER — ACETAMINOPHEN 325 MG PO TABS: 650.0000 mg | ORAL_TABLET | Freq: Four times a day (QID) | ORAL | Status: DC | PRN

## 2022-08-02 NOTE — Discharge Summary (Signed)
Amanda Porter CWC:[id number] DOB: 23-Dec-1945 DOA: 07/24/2022  PCP: Pleas Koch, NP  Admit date: 07/24/2022 Discharge date: 08/02/2022  Admitted From: home Disposition:  SNF  Recommendations for Outpatient Follow-up:  Follow up with PCP in 1 week Please obtain BMP/CBC in one week Please follow up urology in one month Follow up with neurosurgery in one week or as scheduled by Dr. [name]     Discharge Condition:Stable CODE STATUS:full  Diet recommendation: Heart Healthy  Brief/Interim Summary: Per HPI: Amanda Porter is a 76 y.o. African-American female with medical history significant for COPD, CVA, depression, dementia, type 2 diabetes mellitus, dysphagia, dyslipidemia, normal pressure hydrocephalus s/p VP shunt, who presented to the ER with acute onset of altered mental status today as well as agitation over the last 6 days and urinary incontinence over the last 4 to 5 days with unsteady gait likely.  She fell about a week ago and was having left elbow pain.  No reported fever or chills.  No nausea or vomiting or abdominal pain.  She has been having urinary frequency and urgency and was recently treated with p.o. Keflex for UTI that she is finishing soon.  No cough or wheezing or dyspnea.  No new paresthesias or focal muscle weakness.   ED Course: When she came to the ER, vital signs were within normal and later BP was 142/93.  Labs revealed blood gases with pH 7.48 and HCO3 of 35.7, PA CO2 of 48 and PO2 of 66 with O2 sat of 94.9%.  CMP showed potassium of 3.3 with CO2 of 34 and CBC showed anemia close to baseline.    Had multiple imaging done with results below.Initial CT head revealed:Progressive ventriculomegaly when compared to prior examination of 04/17/2022. Stable right frontal ventriculostomy catheter.  Neurosurgery was consulted.  NPH: presenting with acute metabolic encephalopathy, ataxia and urinary incontinence concerning about her worsening hydrocephalus &  ventriculomegaly. Imaging showing intact ventriculoperitoneal shunt. Neurosurgery consulted for possible shunt malfunction  -Lumbar puncture twice for confirmatory high-volume tap, but IR unable to obtain CSF. Per neurosurgery potential for shunt revision to be done electively.  F/u with Dr. [name].       Acute urinary retention Urology was consulted Repeat UA negative. Continue Flomax Bladder scan every 4 hours and I&O cath if >265mL, to be continued upon discharge to SNF. If SNF does not have bladder scanner, recommend standing I&O catheterization every 8 hours. Outpatient urology follow-up in 4 weeks    Hypokalemia-replaced. stable   Dementia: Continue Namenda      Depression: severity unknown.  Continue with Wellbutrin and BuSpar    DM2: HbA1c 7.2, poorly controlled.  Continue medication. Encouraged po intake     Anxiety: severity unknown.  Continue on home dose of klonopin    HLD: continue on statin    Normocytic anemia:  H/h at baseline. F/u with pcp for further management.                  Discharge Diagnoses:  Principal Problem:   NPH (normal pressure hydrocephalus) (HCC) Active Problems:   Hypokalemia   Dementia without behavioral disturbance (HCC)   Anxiety and depression   Hyperlipidemia   Normocytic anemia   Acute urinary retention    Discharge Instructions   Allergies as of 08/02/2022       Reactions   Lexapro [escitalopram Oxalate] Other (See Comments)   Dizziness, nausea, fatigue   Neosporin  [neomycin-polymyxin-gramicidin] Rash   (Neosporin)   Neosporin [bacitracin-polymyxin B] Rash  Medication List     STOP taking these medications    cephALEXin 500 MG capsule Commonly known as: KEFLEX       TAKE these medications    Accu-Chek Softclix Lancets lancets Use as instructed   acetaminophen 325 MG tablet Commonly known as: TYLENOL Take 2 tablets (650 mg total) by mouth every 6 (six) hours as needed for  mild pain (or Fever >/= 101). What changed:  medication strength how much to take reasons to take this   albuterol (2.5 MG/3ML) 0.083% nebulizer solution Commonly known as: PROVENTIL USE 1 VIAL IN NEBULIZER EVERY 6 HOURS What changed: See the new instructions.   albuterol 108 (90 Base) MCG/ACT inhaler Commonly known as: VENTOLIN HFA INHALE 2 PUFFS BY MOUTH EVERY 4 HOURS AS NEEDED What changed: Another medication with the same name was changed. Make sure you understand how and when to take each.   amLODipine 2.5 MG tablet Commonly known as: NORVASC Take 1 tablet (2.5 mg total) by mouth daily.   B-12 PO Take 1 capsule by mouth daily.   buPROPion 300 MG 24 hr tablet Commonly known as: WELLBUTRIN XL Take 1 tablet (300 mg total) by mouth daily with breakfast. What changed:  medication strength how much to take how to take this when to take this additional instructions   busPIRone 15 MG tablet Commonly known as: BUSPAR Take 3 tablets (45 mg total) by mouth daily. What changed:  how much to take how to take this when to take this additional instructions   busPIRone 30 MG tablet Commonly known as: BUSPAR Take 1 tablet (30 mg total) by mouth at bedtime. What changed: You were already taking a medication with the same name, and this prescription was added. Make sure you understand how and when to take each.   clonazePAM 1 MG tablet Commonly known as: KlonoPIN Take 1 tablet (1 mg total) by mouth at bedtime for 2 days.   folic acid 1 MG tablet Commonly known as: FOLVITE Take 1 tablet (1 mg total) by mouth daily.   glipiZIDE 2.5 MG 24 hr tablet Commonly known as: GLUCOTROL XL TAKE 1 TABLET (2.5 MG TOTAL) BY MOUTH DAILY WITH BREAKFAST. FOR DIABETES.   glucose blood test strip Use as instructed   guaiFENesin 600 MG 12 hr tablet Commonly known as: MUCINEX Take 1 tablet (600 mg total) by mouth 2 (two) times daily. What changed:  when to take this reasons to take  this   memantine 5 MG tablet Commonly known as: NAMENDA Take 1 tablet (5 mg total) by mouth 2 (two) times daily. For memory.   OXYGEN 3 L continuous.   rosuvastatin 20 MG tablet Commonly known as: CRESTOR TAKE 1 TABLET (20 MG TOTAL) BY MOUTH EVERY EVENING. FOR CHOLESTEROL.   Spiriva Respimat 1.25 MCG/ACT Aers Generic drug: Tiotropium Bromide Monohydrate Inhale 2 puffs into the lungs daily.   Stiolto Respimat 2.5-2.5 MCG/ACT Aers Generic drug: Tiotropium Bromide-Olodaterol INHALE 2 PUFFS BY MOUTH INTO THE LUNGS DAILY What changed: See the new instructions.   tamsulosin 0.4 MG Caps capsule Commonly known as: FLOMAX Take 1 capsule (0.4 mg total) by mouth daily. For urine retention        Contact information for follow-up providers     Pleas Koch, NP Follow up in 1 week(s).   Specialty: Internal Medicine Contact information: [place] [address] [phone]         Debroah Loop, PA-C Follow up in 1 month(s).  Specialty: Urology Contact information: [place] [address] [phone]         Meade Maw, MD Follow up in 1 week(s).   Specialty: Neurosurgery Contact information: [address] [place] [address] [phone]              Contact information for after-discharge care     Destination     [facility] .   Service: Skilled Nursing Contact information: [place] [address] [phone]                    Allergies  Allergen Reactions   Lexapro [Escitalopram Oxalate] Other (See Comments)    Dizziness, nausea, fatigue   Neosporin  [Neomycin-Polymyxin-Gramicidin] Rash    (Neosporin)   Neosporin [Bacitracin-Polymyxin B] Rash    Consultations: Urology neurosurgery   Procedures/Studies: DG FL GUIDED LUMBAR PUNCTURE  Result Date: 07/29/2022 CLINICAL DATA:  Patient with a history of normal  pressure hydrocephalus. Radiology team asked to perform a high-volume lumbar puncture. EXAM: DIAGNOSTIC LUMBAR PUNCTURE UNDER FLUOROSCOPIC GUIDANCE COMPARISON:  DG fluoro guided lumbar puncture 07/28/2022 FLUOROSCOPY: Radiation Exposure Index (as provided by the fluoroscopic device): 15.3 mGy Kerma PROCEDURE: Informed consent was obtained from the patient's daughter prior to the procedure, including potential complications of headache, allergy, and pain. With the patient prone, the lower back was prepped with Betadine. 1% Lidocaine was used for local anesthesia. Lumbar puncture was performed at the L3-L4 level using a 20 gauge needle with no return of CSF, only a small amount of blood. The procedure was stopped. The patient tolerated the procedure well and there were no apparent complications. IMPRESSION: Attempted L3-L4 lumbar puncture with return of only a small amount of blood. Read by: Soyla Dryer, NP Electronically Signed   By: Macy Mis M.D.   On: 07/29/2022 12:22   CT HEAD WO CONTRAST (5MM)  Result Date: 07/29/2022 CLINICAL DATA:  Head trauma, minor (Age >= 65y).  Unwitnessed fall. EXAM: CT HEAD WITHOUT CONTRAST TECHNIQUE: Contiguous axial images were obtained from the base of the skull through the vertex without intravenous contrast. RADIATION DOSE REDUCTION: This exam was performed according to the departmental dose-optimization program which includes automated exposure control, adjustment of the mA and/or kV according to patient size and/or use of iterative reconstruction technique. COMPARISON:  Head CT 07/25/2022 FINDINGS: Brain: There is no evidence of an acute infarct, intracranial hemorrhage, mass, midline shift, or extra-axial fluid collection. A right frontal approach ventriculostomy catheter is unchanged, traversing the frontal horn of the right lateral ventricle and terminating in the midline. Ventriculomegaly is unchanged. Confluent hypodensities in the cerebral white matter  bilaterally are unchanged and nonspecific but compatible with extensive chronic small vessel ischemic disease. A chronic lacunar infarct is again noted in the right thalamus. Vascular: Calcified atherosclerosis at the skull base. No hyperdense vessel. Skull: No fracture or suspicious osseous lesion. Sinuses/Orbits: Visualized paranasal sinuses and mastoid air cells are clear. Unremarkable orbits. Other: None. IMPRESSION: 1. No evidence of acute intracranial injury. 2. Unchanged ventriculomegaly with ventriculostomy catheter in place. 3. Extensive chronic small vessel ischemic disease. Electronically Signed   By: Logan Bores M.D.   On: 07/29/2022 08:39   DG FL GUIDED LUMBAR PUNCTURE  Result Date: 07/28/2022 CLINICAL DATA:  Patient history of normal pressure hydrocephalus. Request is for high volume lumbar puncture EXAM: DIAGNOSTIC LUMBAR PUNCTURE UNDER FLUOROSCOPIC GUIDANCE COMPARISON:  None Available. FLUOROSCOPY: Radiation Exposure Index (as provided by the fluoroscopic device):  5.20 mGy Kerma PROCEDURE: Informed consent was obtained from the patient prior to the procedure, including potential complications of headache, allergy, and pain. With the patient prone, the lower back was prepped with Betadine. 1% Lidocaine was used for local anesthesia. Lumbar puncture was performed at the L4-5 and L5- S1 level using a 20 gauge needle 3 inch with return of clear CSF. Cerebrospinal fluid pressure was noted to be low and we are unable to obtain enough fluid to obtain pressures or labs. Even with elevating the head of the patient by tilting the table, csf return was negligible with the needle tip in the thecal sac. The patient tolerated the procedure well and there were no apparent complications. IMPRESSION: Technically successful lumbar puncture performed showing low side of normal cerebral spinal pressures. This exam was performed by Karen Chafe.P. with the direct supervision of Dr. Nelson Chimes.  Electronically Signed   By: Nelson Chimes M.D.   On: 07/28/2022 16:13   DG Shoulder Left  Result Date: 07/26/2022 CLINICAL DATA:  Left shoulder pain EXAM: LEFT SHOULDER - 3 VIEW COMPARISON:  None Available. FINDINGS: No evidence of fracture or dislocation. Mild degenerative changes of the glenohumeral joint. Surgical clips overlying the superior right mediastinum. Soft tissues are unremarkable. IMPRESSION: No acute osseous abnormality. Electronically Signed   By: Yetta Glassman M.D.   On: 07/26/2022 20:52   IR FL GUIDED LOC OF NEEDLE/CATH TIP FOR SPINAL INJECT RT  Result Date: 07/25/2022 CLINICAL DATA:  Normal pressure hydrocephalus, high volume lumbar puncture requested EXAM: DIAGNOSTIC LUMBAR PUNCTURE UNDER FLUOROSCOPIC GUIDANCE COMPARISON:  None Available. FLUOROSCOPY: Radiation Exposure Index (as provided by the fluoroscopic device): 0.5 minutes (5 mGy) PROCEDURE: Informed consent was obtained from the patient prior to the procedure, including potential complications of headache, allergy, and pain. With the patient lateral decubitus, the lower back was prepped with chlorhexidine. 1% Lidocaine was used for local anesthesia. Lumbar puncture was performed at the L4-L5 level using a 20 gauge spinal gauge needle with return of clear, colorless CSF with an opening pressure of 16 cm H2O. Approximately 30 ml of CSF was obtained. The patient tolerated the procedure well and there were no apparent complications. IMPRESSION: Successful high volume lumbar puncture using fluoroscopic guidance with removal of 30 mL clear, colorless CSF. Electronically Signed   By: Albin Felling M.D.   On: 07/25/2022 15:25   DG Cervical Spine 1 View  Result Date: 07/25/2022 CLINICAL DATA:  Shunt malfunction EXAM: DG CERVICAL SPINE - 1 VIEW; ABDOMEN - 1 VIEW; SKULL - 1-3 VIEW COMPARISON:  None Available. FINDINGS: Right frontal ventriculoperitoneal shunt is in place. The extracranial catheter appears intact along its cervical and  abdominal course with the tip seen looped within the mid pelvis. The thoracic component seen on chest radiograph of 07/24/2022 and appears intact on that examination. Paranasal sinuses are clear. Normal abdominal gas pattern. Cholelithiasis noted. IMPRESSION: 1. Intact right frontal ventriculoperitoneal shunt. 2. Cholelithiasis. 3. Normal abdominal gas pattern. Electronically Signed   By: Fidela Salisbury M.D.   On: 07/25/2022 04:03   DG Skull 1-3 Views  Result Date: 07/25/2022 CLINICAL DATA:  Shunt malfunction EXAM: DG CERVICAL SPINE - 1 VIEW; ABDOMEN - 1 VIEW; SKULL - 1-3 VIEW COMPARISON:  None Available. FINDINGS: Right frontal ventriculoperitoneal shunt is in place. The extracranial catheter appears intact along its cervical and abdominal course with the tip seen looped within the mid pelvis. The thoracic component seen on chest radiograph of 07/24/2022 and appears intact on that examination.  Paranasal sinuses are clear. Normal abdominal gas pattern. Cholelithiasis noted. IMPRESSION: 1. Intact right frontal ventriculoperitoneal shunt. 2. Cholelithiasis. 3. Normal abdominal gas pattern. Electronically Signed   By: Fidela Salisbury M.D.   On: 07/25/2022 04:03   DG Abd 1 View  Result Date: 07/25/2022 CLINICAL DATA:  Shunt malfunction EXAM: DG CERVICAL SPINE - 1 VIEW; ABDOMEN - 1 VIEW; SKULL - 1-3 VIEW COMPARISON:  None Available. FINDINGS: Right frontal ventriculoperitoneal shunt is in place. The extracranial catheter appears intact along its cervical and abdominal course with the tip seen looped within the mid pelvis. The thoracic component seen on chest radiograph of 07/24/2022 and appears intact on that examination. Paranasal sinuses are clear. Normal abdominal gas pattern. Cholelithiasis noted. IMPRESSION: 1. Intact right frontal ventriculoperitoneal shunt. 2. Cholelithiasis. 3. Normal abdominal gas pattern. Electronically Signed   By: Fidela Salisbury M.D.   On: 07/25/2022 04:03   CT HEAD WO CONTRAST  (5MM)  Result Date: 07/25/2022 CLINICAL DATA:  Mental status change, unknown cause EXAM: CT HEAD WITHOUT CONTRAST TECHNIQUE: Contiguous axial images were obtained from the base of the skull through the vertex without intravenous contrast. RADIATION DOSE REDUCTION: This exam was performed according to the departmental dose-optimization program which includes automated exposure control, adjustment of the mA and/or kV according to patient size and/or use of iterative reconstruction technique. COMPARISON:  04/17/2022 FINDINGS: Brain: Mild parenchymal volume loss is commensurate with the patient's age and appears stable since prior examination. Extensive subcortical and periventricular white matter changes are present likely reflecting the sequela of small vessel ischemia. Right frontal ventriculostomy is unchanged with its tip within the anterior horn of the right lateral ventricle abutting the septum flu some. There is progressive ventriculomegaly when compared to prior examination. Remote infarct noted within the right thalamus. No acute intracranial hemorrhage or infarct. No abnormal mass effect or midline shift. No abnormal intra or extra-axial mass lesion. Cerebellum unremarkable. Vascular: Extensive vascular calcifications within the carotid siphons. No asymmetric hyperdense vasculature at the skull base. Skull: Normal. Negative for fracture or focal lesion. Sinuses/Orbits: No acute finding. Other: Mastoid air cells and middle ear cavities are clear. IMPRESSION: 1. Progressive ventriculomegaly when compared to prior examination of 04/17/2022. Stable right frontal ventriculostomy catheter. 2. Stable advanced senescent changes and remote right thalamic infarct. 3. No acute intracranial hemorrhage or infarct. Electronically Signed   By: Fidela Salisbury M.D.   On: 07/25/2022 01:57   DG Chest Portable 1 View  Result Date: 07/25/2022 CLINICAL DATA:  Altered mental status. EXAM: PORTABLE CHEST 1 VIEW COMPARISON:   Chest radiograph dated 04/16/2022. FINDINGS: Minimal bibasilar atelectasis/scarring. No focal consolidation, pleural effusion or pneumothorax. Background of emphysema. Similar appearance of soft tissue density and associated surgical clips in the left suprahilar/periaortic region. No acute osseous pathology. Shunt tube noted over the right side of the chest. IMPRESSION: 1. No acute cardiopulmonary process. 2. Emphysema. Electronically Signed   By: Anner Crete M.D.   On: 07/25/2022 01:39   DG ELBOW COMPLETE LEFT (3+VIEW)  Result Date: 07/25/2022 CLINICAL DATA:  [place] 1 week ago with continued left elbow pain. EXAM: LEFT ELBOW - COMPLETE 3+ VIEW COMPARISON:  None Available. FINDINGS: There is no evidence of fracture, dislocation, or joint effusion. There is no evidence of arthropathy or other focal bone abnormality. Soft tissues are unremarkable. IMPRESSION: Negative. Electronically Signed   By: Telford Nab M.D.   On: 07/25/2022 01:38      Subjective: No complaints  Discharge Exam: Vitals:   08/02/22 0748 08/02/22  0819  BP: (!) 147/70   Pulse: 85   Resp: 17   Temp: 98.1 F (36.7 C)   SpO2: 100% 98%   Vitals:   08/01/22 1957 08/02/22 0456 08/02/22 0748 08/02/22 0819  BP: (!) 170/81 (!) 143/57 (!) 147/70   Pulse: 88 71 85   Resp: 18 16 17    Temp: 98.4 F (36.9 C) 97.8 F (36.6 C) 98.1 F (36.7 C)   TempSrc: Oral Oral    SpO2: 100% 100% 100% 98%  Weight:      Height:        General: Pt is alert, awake, not in acute distress Cardiovascular: RRR, S1/S2 +, no rubs, no gallops Respiratory: CTA bilaterally, no wheezing, no rhonchi Abdominal: Soft, NT, ND, bowel sounds + Extremities: no edema, no cyanosis    The results of significant diagnostics from this hospitalization (including imaging, microbiology, ancillary and laboratory) are listed below for reference.     Microbiology: Recent Results (from the past 240 hour(s))  Urine Culture     Status: None   Collection  Time: 07/24/22  9:18 PM   Specimen: Urine, Clean Catch  Result Value Ref Range Status   Specimen Description   Final    URINE, CLEAN CATCH Performed at [facility], [address] [place], [place] [address]    Special Requests   Final    NONE Performed at [facility], [address] [place], [place] [address]    Culture   Final    NO GROWTH Performed at [facility], [place] [address], [place], [place] [address]    Report Status 07/26/2022 FINAL  Final  SARS Coronavirus 2 by RT PCR (hospital order, performed in [facility] hospital lab) *cepheid single result test* Anterior Nasal Swab     Status: None   Collection Time: 07/24/22  9:55 PM   Specimen: Anterior Nasal Swab  Result Value Ref Range Status   SARS Coronavirus 2 by RT PCR NEGATIVE NEGATIVE Final    Comment: (NOTE) SARS-CoV-2 target nucleic acids are NOT DETECTED.  The SARS-CoV-2 RNA is generally detectable in upper and lower respiratory specimens during the acute phase of infection. The lowest concentration of SARS-CoV-2 viral copies this assay can detect is 250 copies / mL. A negative result does not preclude SARS-CoV-2 infection and should not be used as the sole basis for treatment or other patient management decisions.  A negative result may occur with improper specimen collection / handling, submission of specimen other than nasopharyngeal swab, presence of viral mutation(s) within the areas targeted by this assay, and inadequate number of viral copies (<250 copies / mL). A negative result must be combined with clinical observations, patient history, and epidemiological information.  Fact Sheet for Patients:   https://www.patel.info/  Fact Sheet for Healthcare Providers: https://hall.com/  This test is not yet approved or  cleared by the [place] FDA and has been authorized for detection and/or diagnosis of SARS-CoV-2 by FDA under an  Emergency Use Authorization (EUA).  This EUA will remain in effect (meaning this test can be used) for the duration of the COVID-19 declaration under Section 564(b)(1) of the Act, 21 U.S.C. section 360bbb-3(b)(1), unless the authorization is terminated or revoked sooner.  Performed at [facility], [address] [place], [place] [address]   CSF culture w Gram Stain     Status: None   Collection Time: 07/25/22  2:54 PM   Specimen: PATH Cytology CSF; Cerebrospinal Fluid  Result Value  Ref Range Status   Specimen Description   Final    CSF Performed at [facility], [address] [place], [place] [address]    Special Requests   Final    NONE Performed at [facility], [address] [place], [place] [address]    Gram Stain   Final    NO ORGANISMS SEEN NO WBC SEEN RED BLOOD CELLS PRESENT Performed at [facility], [address], [place], [place] [address]    Culture   Final    NO GROWTH 3 DAYS Performed at [facility], [place] [address], [place], [place] [address]    Report Status 07/28/2022 FINAL  Final  Urine Culture     Status: None   Collection Time: 07/29/22  6:33 AM   Specimen: Urine, Clean Catch  Result Value Ref Range Status   Specimen Description   Final    URINE, CLEAN CATCH Performed at [facility], [address], [place], [place] [address]    Special Requests   Final    NONE Performed at [facility], [address], [place],  [address]    Culture   Final    NO GROWTH Performed at [facility], [place] [address] [place],  [address]    Report Status 07/30/2022 FINAL  Final     Labs: BNP (last 3 results) No results for input(s): "BNP" in the last 8760 hours. Basic Metabolic Panel: Recent Labs  Lab 07/27/22 0511 07/28/22 0410 07/29/22 0518 07/30/22 0438  NA 143 142 141 142  K 3.4* 3.4* 3.8 4.1  CL 107 106 107 106  CO2 30 28 30 30   GLUCOSE 134* 123* 134* 108*   BUN 11 21 16 15   CREATININE 1.08* 0.82 0.65 0.59  CALCIUM 9.1 9.2 9.1 9.2  MG  --   --  2.1 2.2   Liver Function Tests: No results for input(s): "AST", "ALT", "ALKPHOS", "BILITOT", "PROT", "ALBUMIN" in the last 168 hours. No results for input(s): "LIPASE", "AMYLASE" in the last 168 hours. No results for input(s): "AMMONIA" in the last 168 hours. CBC: Recent Labs  Lab 07/27/22 0511 07/28/22 0410 07/29/22 0518 07/30/22 0438  WBC 7.2 9.2 9.1 6.5  HGB 9.6* 9.4* 9.1* 9.8*  HCT 30.5* 30.6* 29.9* 32.1*  MCV 90.0 90.0 90.1 89.9  PLT 286 281 269 289   Cardiac Enzymes: No results for input(s): "CKTOTAL", "CKMB", "CKMBINDEX", "TROPONINI" in the last 168 hours. BNP: Invalid input(s): "POCBNP" CBG: Recent Labs  Lab 08/01/22 0756 08/01/22 1148 08/01/22 1539 08/01/22 1958 08/02/22 0758  GLUCAP 137* 149* 156* 125* 139*   D-Dimer No results for input(s): "DDIMER" in the last 72 hours. Hgb A1c No results for input(s): "HGBA1C" in the last 72 hours. Lipid Profile No results for input(s): "CHOL", "HDL", "LDLCALC", "TRIG", "CHOLHDL", "LDLDIRECT" in the last 72 hours. Thyroid function studies No results for input(s): "TSH", "T4TOTAL", "T3FREE", "THYROIDAB" in the last 72 hours.  Invalid input(s): "FREET3" Anemia work up No results for input(s): "VITAMINB12", "FOLATE", "FERRITIN", "TIBC", "IRON", "RETICCTPCT" in the last 72 hours. Urinalysis    Component Value Date/Time   COLORURINE YELLOW (A) 08/01/2022 1540   APPEARANCEUR CLEAR (A) 08/01/2022 1540   APPEARANCEUR Clear 05/24/2019 0823   LABSPEC 1.016 08/01/2022 1540   PHURINE 6.0 08/01/2022 1540   GLUCOSEU NEGATIVE 08/01/2022 1540   HGBUR NEGATIVE 08/01/2022 1540   BILIRUBINUR NEGATIVE 08/01/2022 1540   BILIRUBINUR negative 06/06/2022 1543   BILIRUBINUR Negative 04/04/2022 1437  BILIRUBINUR Negative 05/24/2019 0823   KETONESUR NEGATIVE 08/01/2022 1540   PROTEINUR NEGATIVE 08/01/2022 1540   UROBILINOGEN 0.2 06/06/2022 1543    NITRITE NEGATIVE 08/01/2022 1540   LEUKOCYTESUR NEGATIVE 08/01/2022 1540   Sepsis Labs Recent Labs  Lab 07/27/22 0511 07/28/22 0410 07/29/22 0518 07/30/22 0438  WBC 7.2 9.2 9.1 6.5   Microbiology Recent Results (from the past 240 hour(s))  Urine Culture     Status: None   Collection Time: 07/24/22  9:18 PM   Specimen: Urine, Clean Catch  Result Value Ref Range Status   Specimen Description   Final    URINE, CLEAN CATCH Performed at [facility], [address], [place], [place] [address]    Special Requests   Final    NONE Performed at [facility], [address] [place], [place] [address]    Culture   Final    NO GROWTH Performed at [facility], [place] [address], [place], [place] [address]    Report Status 07/26/2022 FINAL  Final  SARS Coronavirus 2 by RT PCR (hospital order, performed in [facility] hospital lab) *cepheid single result test* Anterior Nasal Swab     Status: None   Collection Time: 07/24/22  9:55 PM   Specimen: Anterior Nasal Swab  Result Value Ref Range Status   SARS Coronavirus 2 by RT PCR NEGATIVE NEGATIVE Final    Comment: (NOTE) SARS-CoV-2 target nucleic acids are NOT DETECTED.  The SARS-CoV-2 RNA is generally detectable in upper and lower respiratory specimens during the acute phase of infection. The lowest concentration of SARS-CoV-2 viral copies this assay can detect is 250 copies / mL. A negative result does not preclude SARS-CoV-2 infection and should not be used as the sole basis for treatment or other patient management decisions.  A negative result may occur with improper specimen collection / handling, submission of specimen other than nasopharyngeal swab, presence of viral mutation(s) within the areas targeted by this assay, and inadequate number of viral copies (<250 copies / mL). A negative result must be combined with clinical observations, patient history, and epidemiological information.  Fact Sheet  for Patients:   https://www.patel.info/  Fact Sheet for Healthcare Providers: https://hall.com/  This test is not yet approved or  cleared by the [place] FDA and has been authorized for detection and/or diagnosis of SARS-CoV-2 by FDA under an Emergency Use Authorization (EUA).  This EUA will remain in effect (meaning this test can be used) for the duration of the COVID-19 declaration under Section 564(b)(1) of the Act, 21 U.S.C. section 360bbb-3(b)(1), unless the authorization is terminated or revoked sooner.  Performed at [facility], [address] [place], [place] [address]   CSF culture w Gram Stain     Status: None   Collection Time: 07/25/22  2:54 PM   Specimen: PATH Cytology CSF; Cerebrospinal Fluid  Result Value Ref Range Status   Specimen Description   Final    CSF Performed at [facility], [address], [place], [place] [address]    Special Requests   Final    NONE Performed at [facility], [address] [place], [place] [address]    Gram Stain   Final    NO ORGANISMS SEEN NO WBC SEEN RED BLOOD CELLS PRESENT Performed at [facility], [address] [place], [place] [address]    Culture   Final    NO GROWTH 3 DAYS Performed at [facility], [place]  [address], [place], [place] [address]    Report Status 07/28/2022 FINAL  Final  Urine Culture     Status: None   Collection Time: 07/29/22  6:33 AM   Specimen: Urine, Clean Catch  Result Value Ref Range Status   Specimen Description   Final    URINE, CLEAN CATCH Performed at [facility], [address] [place], [place] [address]    Special Requests   Final    NONE Performed at [facility], [address], [place], [place] [address]    Culture   Final    NO GROWTH Performed at [facility], [place] [address] [place],  [address]    Report Status 07/30/2022 FINAL  Final      Time coordinating discharge: Over 30 minutes  SIGNED:   Nolberto Hanlon, MD  [facility] 08/02/2022, 9:17 AM Pager   If 7PM-7AM, please contact night-coverage [email or web address] Password [email or web address]

## 2022-08-02 NOTE — TOC Transition Note (Signed)
Transition of Care [facility]) - CM/SW Discharge Note   Patient Details  Name: Amanda Porter MRN: [id number] Date of Birth: July 04, 1946  [facility] CM/SW Contact:  Izola Price, RN Phone Number: 08/02/2022, 10:11 AM   Clinical Narrative: 9/2: Patient is discharging today from acute care and will be transported to next level of care at [facility] by daughter, Rees Matura who was notified by phone via RN CM.   Confirmed with AC at [facility] [place]. Report to be called to [phone] and ask for [place] Nurse. DC Summary inboxed via Hub.  Simmie Davies RN CM     Final next level of care: Skilled Nursing Facility Barriers to Discharge: Barriers Resolved   Patient Goals and CMS Choice Patient states their goals for this hospitalization and ongoing recovery are:: Home with home health CMS Medicare.gov Compare Post Acute Care list provided to:: Patient Represenative (must comment) [name], daughter) Choice offered to / list presented to : Adult Children  Discharge Placement              Patient chooses bed at: [facility] Patient to be transferred to facility by: Daughter [name] Name of family member notified: Thurley Francesconi  [phone] [facility] Phone Patient and family notified of of transfer: 08/02/22  Discharge Plan and Services     Post Acute Care Choice: Home Health          DME Arranged: N/A DME Agency: NA       HH Arranged: NA HH Agency: NA        Social Determinants of Health (SDOH) Interventions     Readmission Risk Interventions     No data to display

## 2022-08-02 NOTE — Plan of Care (Signed)
Problem: Education: Goal: Knowledge of General Education information will improve Description: Including pain rating scale, medication(s)/side effects and non-pharmacologic comfort measures 08/02/2022 0052 by Avon Gully, RN Outcome: Progressing 08/02/2022 0051 by Avon Gully, RN Outcome: Not Progressing   Problem: Health Behavior/Discharge Planning: Goal: Ability to manage health-related needs will improve 08/02/2022 0052 by Avon Gully, RN Outcome: Progressing 08/02/2022 0051 by Avon Gully, RN Outcome: Not Progressing   Problem: Clinical Measurements: Goal: Ability to maintain clinical measurements within normal limits will improve 08/02/2022 0052 by Avon Gully, RN Outcome: Progressing 08/02/2022 0051 by Avon Gully, RN Outcome: Not Progressing Goal: Will remain free from infection 08/02/2022 0052 by Avon Gully, RN Outcome: Progressing 08/02/2022 0051 by Avon Gully, RN Outcome: Not Progressing Goal: Diagnostic test results will improve 08/02/2022 0052 by Avon Gully, RN Outcome: Progressing 08/02/2022 0051 by Avon Gully, RN Outcome: Not Progressing Goal: Respiratory complications will improve 08/02/2022 0052 by Avon Gully, RN Outcome: Progressing 08/02/2022 0051 by Avon Gully, RN Outcome: Not Progressing Goal: Cardiovascular complication will be avoided 08/02/2022 0052 by Avon Gully, RN Outcome: Progressing 08/02/2022 0051 by Avon Gully, RN Outcome: Not Progressing   Problem: Activity: Goal: Risk for activity intolerance will decrease 08/02/2022 0052 by Avon Gully, RN Outcome: Progressing 08/02/2022 0051 by Avon Gully, RN Outcome: Not Progressing   Problem: Nutrition: Goal: Adequate nutrition will be maintained 08/02/2022 0052 by Avon Gully, RN Outcome: Progressing 08/02/2022 0051 by Avon Gully, RN Outcome: Not Progressing   Problem: Coping: Goal: Level of anxiety will decrease 08/02/2022 0052 by Avon Gully, RN Outcome:  Progressing 08/02/2022 0051 by Avon Gully, RN Outcome: Not Progressing   Problem: Elimination: Goal: Will not experience complications related to bowel motility 08/02/2022 0052 by Avon Gully, RN Outcome: Progressing 08/02/2022 0051 by Avon Gully, RN Outcome: Not Progressing Goal: Will not experience complications related to urinary retention 08/02/2022 0052 by Avon Gully, RN Outcome: Progressing 08/02/2022 0051 by Avon Gully, RN Outcome: Not Progressing   Problem: Pain Managment: Goal: General experience of comfort will improve 08/02/2022 0052 by Avon Gully, RN Outcome: Progressing 08/02/2022 0051 by Avon Gully, RN Outcome: Not Progressing   Problem: Safety: Goal: Ability to remain free from injury will improve 08/02/2022 0052 by Avon Gully, RN Outcome: Progressing 08/02/2022 0051 by Avon Gully, RN Outcome: Not Progressing   Problem: Skin Integrity: Goal: Risk for impaired skin integrity will decrease 08/02/2022 0052 by Avon Gully, RN Outcome: Progressing 08/02/2022 0051 by Avon Gully, RN Outcome: Not Progressing   Problem: Education: Goal: Ability to describe self-care measures that may prevent or decrease complications (Diabetes Survival Skills Education) will improve 08/02/2022 0052 by Avon Gully, RN Outcome: Progressing 08/02/2022 0051 by Avon Gully, RN Outcome: Not Progressing Goal: Individualized Educational Video(s) 08/02/2022 0052 by Avon Gully, RN Outcome: Progressing 08/02/2022 0051 by Avon Gully, RN Outcome: Not Progressing   Problem: Coping: Goal: Ability to adjust to condition or change in health will improve 08/02/2022 0052 by Avon Gully, RN Outcome: Progressing 08/02/2022 0051 by Avon Gully, RN Outcome: Not Progressing   Problem: Fluid Volume: Goal: Ability to maintain a balanced intake and output will improve 08/02/2022 0052 by Avon Gully, RN Outcome: Progressing 08/02/2022 0051 by Avon Gully,  RN Outcome: Not Progressing   Problem: Health Behavior/Discharge Planning: Goal: Ability to identify and utilize available resources and services will improve 08/02/2022 0052 by Avon Gully, RN Outcome: Progressing 08/02/2022 0051 by Avon Gully, RN Outcome: Not Progressing Goal: Ability to manage health-related needs will improve 08/02/2022 0052 by Avon Gully, RN Outcome: Progressing 08/02/2022  0623 by Avon Gully, RN Outcome: Not Progressing   Problem: Metabolic: Goal: Ability to maintain appropriate glucose levels will improve 08/02/2022 0052 by Avon Gully, RN Outcome: Progressing 08/02/2022 0051 by Avon Gully, RN Outcome: Not Progressing   Problem: Nutritional: Goal: Maintenance of adequate nutrition will improve 08/02/2022 0052 by Avon Gully, RN Outcome: Progressing 08/02/2022 0051 by Avon Gully, RN Outcome: Not Progressing Goal: Progress toward achieving an optimal weight will improve 08/02/2022 0052 by Avon Gully, RN Outcome: Progressing 08/02/2022 0051 by Avon Gully, RN Outcome: Not Progressing   Problem: Skin Integrity: Goal: Risk for impaired skin integrity will decrease 08/02/2022 0052 by Avon Gully, RN Outcome: Progressing 08/02/2022 0051 by Avon Gully, RN Outcome: Not Progressing   Problem: Tissue Perfusion: Goal: Adequacy of tissue perfusion will improve 08/02/2022 0052 by Avon Gully, RN Outcome: Progressing 08/02/2022 0051 by Avon Gully, RN Outcome: Not Progressing   Problem: Education: Goal: Knowledge of General Education information will improve Description: Including pain rating scale, medication(s)/side effects and non-pharmacologic comfort measures 08/02/2022 0052 by Avon Gully, RN Outcome: Progressing 08/02/2022 0051 by Avon Gully, RN Outcome: Not Progressing   Problem: Health Behavior/Discharge Planning: Goal: Ability to manage health-related needs will improve 08/02/2022 0052 by Avon Gully, RN Outcome:  Progressing 08/02/2022 0051 by Avon Gully, RN Outcome: Not Progressing   Problem: Clinical Measurements: Goal: Ability to maintain clinical measurements within normal limits will improve 08/02/2022 0052 by Avon Gully, RN Outcome: Progressing 08/02/2022 0051 by Avon Gully, RN Outcome: Not Progressing Goal: Will remain free from infection 08/02/2022 0052 by Avon Gully, RN Outcome: Progressing 08/02/2022 0051 by Avon Gully, RN Outcome: Not Progressing Goal: Diagnostic test results will improve 08/02/2022 0052 by Avon Gully, RN Outcome: Progressing 08/02/2022 0051 by Avon Gully, RN Outcome: Not Progressing   Problem: Activity: Goal: Risk for activity intolerance will decrease 08/02/2022 0052 by Avon Gully, RN Outcome: Progressing 08/02/2022 0051 by Avon Gully, RN Outcome: Not Progressing   Problem: Nutrition: Goal: Adequate nutrition will be maintained 08/02/2022 0052 by Avon Gully, RN Outcome: Progressing 08/02/2022 0051 by Avon Gully, RN Outcome: Not Progressing   Problem: Elimination: Goal: Will not experience complications related to bowel motility 08/02/2022 0052 by Avon Gully, RN Outcome: Progressing 08/02/2022 0051 by Avon Gully, RN Outcome: Not Progressing Goal: Will not experience complications related to urinary retention 08/02/2022 0052 by Avon Gully, RN Outcome: Progressing 08/02/2022 0051 by Avon Gully, RN Outcome: Not Progressing   Problem: Pain Managment: Goal: General experience of comfort will improve 08/02/2022 0052 by Avon Gully, RN Outcome: Progressing 08/02/2022 0051 by Avon Gully, RN Outcome: Not Progressing   Problem: Safety: Goal: Ability to remain free from injury will improve 08/02/2022 0052 by Avon Gully, RN Outcome: Progressing 08/02/2022 0051 by Avon Gully, RN Outcome: Not Progressing   Problem: Skin Integrity: Goal: Risk for impaired skin integrity will decrease 08/02/2022 0052 by Avon Gully, RN Outcome: Progressing 08/02/2022 0051 by Avon Gully, RN Outcome: Not Progressing

## 2022-08-03 ENCOUNTER — Other Ambulatory Visit: Payer: Self-pay | Admitting: Primary Care

## 2022-08-03 ENCOUNTER — Encounter: Payer: Self-pay | Admitting: Internal Medicine

## 2022-08-03 DIAGNOSIS — E119 Type 2 diabetes mellitus without complications: Secondary | ICD-10-CM

## 2022-08-03 DIAGNOSIS — R918 Other nonspecific abnormal finding of lung field: Secondary | ICD-10-CM | POA: Insufficient documentation

## 2022-08-03 NOTE — Assessment & Plan Note (Signed)
Plan- requesting disc. Schedule f/u CT 6 months (September)

## 2022-08-03 NOTE — Assessment & Plan Note (Signed)
Needs to continue supplemental O2 Plan- O2 concentrator 3L while at [employer]

## 2022-08-04 LAB — URINE CULTURE: Culture: >=100000 — AB

## 2022-08-05 ENCOUNTER — Ambulatory Visit
Admission: RE | Admit: 2022-08-05 | Discharge: 2022-08-05 | Disposition: A | Discharge status: Home / Self Care | Payer: Self-pay | Source: Ambulatory Visit | Attending: Neurosurgery | Admitting: Neurosurgery

## 2022-08-05 ENCOUNTER — Ambulatory Visit (HOSPITAL_COMMUNITY): Payer: Medicare PPO

## 2022-08-05 ENCOUNTER — Other Ambulatory Visit: Payer: Self-pay

## 2022-08-05 ENCOUNTER — Other Ambulatory Visit: Payer: Self-pay | Admitting: Physician Assistant

## 2022-08-05 DIAGNOSIS — Z049 Encounter for examination and observation for unspecified reason: Secondary | ICD-10-CM

## 2022-08-05 DIAGNOSIS — E1151 Type 2 diabetes mellitus with diabetic peripheral angiopathy without gangrene: Secondary | ICD-10-CM | POA: Diagnosis not present

## 2022-08-05 DIAGNOSIS — D649 Anemia, unspecified: Secondary | ICD-10-CM | POA: Diagnosis not present

## 2022-08-05 DIAGNOSIS — J449 Chronic obstructive pulmonary disease, unspecified: Secondary | ICD-10-CM | POA: Diagnosis not present

## 2022-08-05 DIAGNOSIS — I69391 Dysphagia following cerebral infarction: Secondary | ICD-10-CM | POA: Diagnosis not present

## 2022-08-05 DIAGNOSIS — Z982 Presence of cerebrospinal fluid drainage device: Secondary | ICD-10-CM | POA: Diagnosis not present

## 2022-08-05 DIAGNOSIS — R509 Fever, unspecified: Secondary | ICD-10-CM | POA: Diagnosis not present

## 2022-08-05 DIAGNOSIS — R1310 Dysphagia, unspecified: Secondary | ICD-10-CM | POA: Diagnosis not present

## 2022-08-05 DIAGNOSIS — G912 (Idiopathic) normal pressure hydrocephalus: Secondary | ICD-10-CM | POA: Diagnosis not present

## 2022-08-05 DIAGNOSIS — R269 Unspecified abnormalities of gait and mobility: Secondary | ICD-10-CM | POA: Diagnosis not present

## 2022-08-05 MED ORDER — NITROFURANTOIN MONOHYD MACRO 100 MG PO CAPS: 100.0000 mg | ORAL_CAPSULE | Freq: Two times a day (BID) | ORAL | 0 refills | Status: AC

## 2022-08-05 NOTE — Progress Notes (Signed)
Macrobid 100mg  BID x7 days faxed to SNF given positive inpatient urine culture.

## 2022-08-07 DIAGNOSIS — M60812 Other myositis, left shoulder: Secondary | ICD-10-CM | POA: Diagnosis not present

## 2022-08-07 DIAGNOSIS — R2681 Unsteadiness on feet: Secondary | ICD-10-CM | POA: Diagnosis not present

## 2022-08-07 DIAGNOSIS — G912 (Idiopathic) normal pressure hydrocephalus: Secondary | ICD-10-CM | POA: Diagnosis not present

## 2022-08-07 DIAGNOSIS — F0390 Unspecified dementia without behavioral disturbance: Secondary | ICD-10-CM | POA: Diagnosis not present

## 2022-08-07 DIAGNOSIS — G8929 Other chronic pain: Secondary | ICD-10-CM | POA: Diagnosis not present

## 2022-08-07 DIAGNOSIS — M6281 Muscle weakness (generalized): Secondary | ICD-10-CM | POA: Diagnosis not present

## 2022-08-07 DIAGNOSIS — E119 Type 2 diabetes mellitus without complications: Secondary | ICD-10-CM | POA: Diagnosis not present

## 2022-08-07 DIAGNOSIS — R5381 Other malaise: Secondary | ICD-10-CM | POA: Diagnosis not present

## 2022-08-07 DIAGNOSIS — I639 Cerebral infarction, unspecified: Secondary | ICD-10-CM | POA: Diagnosis not present

## 2022-08-11 ENCOUNTER — Encounter: Payer: Self-pay | Admitting: Neurosurgery

## 2022-08-11 DIAGNOSIS — F03B18 Unspecified dementia, moderate, with other behavioral disturbance: Secondary | ICD-10-CM | POA: Diagnosis not present

## 2022-08-11 DIAGNOSIS — M60812 Other myositis, left shoulder: Secondary | ICD-10-CM | POA: Diagnosis not present

## 2022-08-11 DIAGNOSIS — F0390 Unspecified dementia without behavioral disturbance: Secondary | ICD-10-CM | POA: Diagnosis not present

## 2022-08-11 DIAGNOSIS — F331 Major depressive disorder, recurrent, moderate: Secondary | ICD-10-CM | POA: Diagnosis not present

## 2022-08-11 DIAGNOSIS — M6281 Muscle weakness (generalized): Secondary | ICD-10-CM | POA: Diagnosis not present

## 2022-08-11 DIAGNOSIS — R2681 Unsteadiness on feet: Secondary | ICD-10-CM | POA: Diagnosis not present

## 2022-08-11 DIAGNOSIS — G8929 Other chronic pain: Secondary | ICD-10-CM | POA: Diagnosis not present

## 2022-08-11 DIAGNOSIS — I639 Cerebral infarction, unspecified: Secondary | ICD-10-CM | POA: Diagnosis not present

## 2022-08-11 DIAGNOSIS — F411 Generalized anxiety disorder: Secondary | ICD-10-CM | POA: Diagnosis not present

## 2022-08-11 DIAGNOSIS — G912 (Idiopathic) normal pressure hydrocephalus: Secondary | ICD-10-CM | POA: Diagnosis not present

## 2022-08-11 DIAGNOSIS — R5381 Other malaise: Secondary | ICD-10-CM | POA: Diagnosis not present

## 2022-08-11 DIAGNOSIS — E119 Type 2 diabetes mellitus without complications: Secondary | ICD-10-CM | POA: Diagnosis not present

## 2022-08-11 NOTE — Progress Notes (Unsigned)
Referring Physician:  Nolberto Hanlon, MD [address],  [place] [address]  Primary Physician:  Pleas Koch, NP  History of Present Illness: 08/12/2022 Amanda Porter is here today with a chief complaint of urinary incontinence, unsteady gait, and altered mental status. She was seen in the ER for this on 07/24/2022.  She is being treated for urinary tract infection currently.  07/25/2022 Amanda Porter is here today with a chief complaint of altered mental status, agitation, and urinary incontinence in the past few days.  She has had worsening unsteady gait with at least one fall.  She lives at baseline with her sister.   She denies HA, N, V.   Review of Systems:  A 10 point review of systems is negative, except for the pertinent positives and negatives detailed in the HPI.  Past Medical History: Past Medical History:  Diagnosis Date   Acute respiratory failure requiring reintubation ([place]) 11/05/2021   Anxiety    Anxiety and depression    COPD (chronic obstructive pulmonary disease) (HCC)    CVA (cerebral vascular accident) ([place])    CVA (cerebral vascular accident) ([place])    L sided deficits   Dementia ([place])    Depression    Diabetes mellitus ([place])    Type 2   Diabetes mellitus without complication ([place])    Dysphagia    Dysphasia    History of lung cancer    Hyperlipidemia    Influenza A 12/13/2021   Memory disturbance 11/20/2017   Normal pressure hydrocephalus ([place]) 2018   Retinal detachment    Right   Right carotid bruit 11/20/2017   Tardive dyskinesia 02/01/2020   Tardive dyskinesia    Thrombosis    Arterial to lower extremity?    Past Surgical History: Past Surgical History:  Procedure Laterality Date   IR FL GUIDED LOC OF NEEDLE/CATH TIP FOR SPINAL INJECTION RT  07/25/2022   LUNG REMOVAL, PARTIAL     left upper lobe   VENTRICULOPERITONEAL SHUNT  12/09/2016   VENTRICULOPERITONEAL SHUNT      Allergies: Allergies as of 08/12/2022 -  Review Complete 08/12/2022  Allergen Reaction Noted   Lexapro [escitalopram oxalate] Other (See Comments) 09/23/2017   Neosporin  [neomycin-polymyxin-gramicidin] Rash    Neosporin [bacitracin-polymyxin b] Rash 11/04/2021    Medications: No outpatient medications have been marked as taking for the 08/12/22 encounter (Office Visit) with Meade Maw, MD.    Social History: Social History   Tobacco Use   Smoking status: Former    Packs/day: 0.50    Years: 50.00    Total pack years: 25.00    Types: Cigarettes    Quit date: 08/01/2017    Years since quitting: 5.0   Smokeless tobacco: Never  Vaping Use   Vaping Use: Never used  Substance Use Topics   Alcohol use: Not Currently   Drug use: Not Currently    Family Medical History: Family History  Problem Relation Age of Onset   Pneumonia Mother    Cancer Father    Diabetes Sister    Diabetes Sister    Breast cancer Neg Hx     Physical Examination: Vitals:   08/12/22 0846  BP: 139/66  Pulse: 84    General: Patient is well developed, well nourished, calm, collected, and in no apparent distress. Attention to examination is appropriate.  Neck:   Supple.  Full range of motion.  Respiratory: Patient is breathing without any difficulty.   NEUROLOGICAL:  Awake, alert, oriented to person, place, and 2023 in September with choices.  Speech is clear and fluent. Fund of knowledge is diminished as consistent with her dementia.   Cranial Nerves: Pupils equal round and reactive to light.  Facial tone is symmetric.  Facial sensation is symmetric. Shoulder shrug is symmetric. Tongue protrusion is midline.  There is no pronator drift.  ROM of spine: full.    Strength: 5-5 strength throughout  Reflexes 1+ throughout.  Gait is untested due to need for wheelchair.      Medical Decision Making  Imaging: CT Head 07/29/22 IMPRESSION: 1. No evidence of acute intracranial injury. 2. Unchanged ventriculomegaly with  ventriculostomy catheter in place. 3. Extensive chronic small vessel ischemic disease.     Electronically Signed   By: Logan Bores M.D.   On: 07/29/2022 08:39  Note that she has significant increase in size of her ventricles since imaging earlier this year  I have personally reviewed the images and agree with the above interpretation.  Assessment and Plan: Amanda Porter is a pleasant 76 y.o. female with normal pressure hydrocephalus status post ventriculoperitoneal shunt with radiographic evidence of a shunt malfunction.  She had a high-volume tap followed by physical therapy evaluation and her daughter noted that she had improvement in her cognitive symptoms.  We discussed the utility of a shunt revision.  I recommended removal of her current shunt with replacement at the same time.  She does have some elevated medical risk given her COPD and other issues.  We will make sure that she has appropriate medical clearance.  I discussed the planned procedure at length with the patient and her daughter, including the risks, benefits, alternatives, and indications. The risks discussed include but are not limited to bleeding, infection, need for reoperation, spinal fluid leak, stroke, vision loss, anesthetic complication, coma, paralysis, and even death. I also described in detail that improvement was not guaranteed.  The patient and daughter expressed understanding of these risks, and asked that we proceed with surgery. I described the surgery in layman's terms, and gave ample opportunity for questions, which were answered to the best of my ability.  She will need clearance from her pulmonologist.  I will touch base with one of the general surgeons to obtain assistance in the operating room for abdominal access.  I spent a total of 30 minutes in face-to-face and non-face-to-face activities related to this patient's care today.  Thank you for involving me in the care of this patient.      Orean Giarratano  K. [name] MD, [facility] Neurosurgery

## 2022-08-12 ENCOUNTER — Ambulatory Visit (INDEPENDENT_AMBULATORY_CARE_PROVIDER_SITE_OTHER): Payer: Medicare PPO | Admitting: Neurosurgery

## 2022-08-12 ENCOUNTER — Other Ambulatory Visit: Payer: Self-pay

## 2022-08-12 ENCOUNTER — Encounter: Payer: Self-pay | Admitting: Neurosurgery

## 2022-08-12 VITALS — BP 139/66 | HR 84 | Wt 108.0 lb

## 2022-08-12 DIAGNOSIS — Z01818 Encounter for other preprocedural examination: Secondary | ICD-10-CM

## 2022-08-12 DIAGNOSIS — G912 (Idiopathic) normal pressure hydrocephalus: Secondary | ICD-10-CM

## 2022-08-12 DIAGNOSIS — T85618A Breakdown (mechanical) of other specified internal prosthetic devices, implants and grafts, initial encounter: Secondary | ICD-10-CM

## 2022-08-12 DIAGNOSIS — T85618S Breakdown (mechanical) of other specified internal prosthetic devices, implants and grafts, sequela: Secondary | ICD-10-CM

## 2022-08-12 NOTE — Addendum Note (Signed)
Addended by: Berdine Addison on: 08/12/2022 09:27 AM   Modules accepted: Orders

## 2022-08-12 NOTE — Patient Instructions (Signed)
Please see below for information in regards to your upcoming surgery:  Planned surgery: ventriculoperitoneal shunt revision   Surgery date: 09/05/22 - you will find out your arrival time the business day before your surgery.   Surgical clearance: we will send a clearance request to Alma Friendly, NP   Pre-op appointment at [facility]: we will call you with a date/time for this. Pre-admit testing is located on the first floor of [facility], [place], [address].   Pre-op labs may be done at your pre-op appointment. You are not required to fast for these labs.    Should you need to change your pre-op appointment, please call Pre-admit testing at [phone].    If you have FMLA/disability paperwork, please drop it off or fax it to [phone], attention [place].   If you have any questions/concerns before or after surgery, you can reach [place] at [phone], or you can send a mychart message. If you have a concern after hours that cannot wait until normal business hours, you can call [phone] or [phone] and ask the answering service to page the neurosurgeon on call.    Appointments/FMLA & disability paperwork: [place] Nurse: Ophelia Shoulder  Medical assistant: Raquel Sarna Physician Assistant's: Cooper Render & Geronimo Boot Surgeon: Meade Maw, MD

## 2022-08-13 ENCOUNTER — Ambulatory Visit (HOSPITAL_COMMUNITY): Admission: RE | Admit: 2022-08-13 | Payer: Medicare PPO | Source: Ambulatory Visit

## 2022-08-13 ENCOUNTER — Other Ambulatory Visit (HOSPITAL_COMMUNITY): Payer: Self-pay | Admitting: Internal Medicine

## 2022-08-13 ENCOUNTER — Inpatient Hospital Stay
Admission: RE | Admit: 2022-08-13 | Discharge: 2022-08-13 | Disposition: A | Discharge status: Home / Self Care | Payer: Self-pay | Source: Ambulatory Visit | Attending: Internal Medicine | Admitting: Internal Medicine

## 2022-08-13 DIAGNOSIS — G8929 Other chronic pain: Secondary | ICD-10-CM | POA: Diagnosis not present

## 2022-08-13 DIAGNOSIS — I639 Cerebral infarction, unspecified: Secondary | ICD-10-CM | POA: Diagnosis not present

## 2022-08-13 DIAGNOSIS — F0390 Unspecified dementia without behavioral disturbance: Secondary | ICD-10-CM | POA: Diagnosis not present

## 2022-08-13 DIAGNOSIS — G912 (Idiopathic) normal pressure hydrocephalus: Secondary | ICD-10-CM | POA: Diagnosis not present

## 2022-08-13 DIAGNOSIS — R1010 Upper abdominal pain, unspecified: Secondary | ICD-10-CM

## 2022-08-13 DIAGNOSIS — M60812 Other myositis, left shoulder: Secondary | ICD-10-CM | POA: Diagnosis not present

## 2022-08-13 DIAGNOSIS — R2681 Unsteadiness on feet: Secondary | ICD-10-CM | POA: Diagnosis not present

## 2022-08-13 DIAGNOSIS — M6281 Muscle weakness (generalized): Secondary | ICD-10-CM | POA: Diagnosis not present

## 2022-08-13 DIAGNOSIS — E119 Type 2 diabetes mellitus without complications: Secondary | ICD-10-CM | POA: Diagnosis not present

## 2022-08-13 DIAGNOSIS — R5381 Other malaise: Secondary | ICD-10-CM | POA: Diagnosis not present

## 2022-08-15 DIAGNOSIS — I69391 Dysphagia following cerebral infarction: Secondary | ICD-10-CM | POA: Diagnosis not present

## 2022-08-15 DIAGNOSIS — D649 Anemia, unspecified: Secondary | ICD-10-CM | POA: Diagnosis not present

## 2022-08-15 DIAGNOSIS — Z982 Presence of cerebrospinal fluid drainage device: Secondary | ICD-10-CM | POA: Diagnosis not present

## 2022-08-15 DIAGNOSIS — G912 (Idiopathic) normal pressure hydrocephalus: Secondary | ICD-10-CM | POA: Diagnosis not present

## 2022-08-15 DIAGNOSIS — E1151 Type 2 diabetes mellitus with diabetic peripheral angiopathy without gangrene: Secondary | ICD-10-CM | POA: Diagnosis not present

## 2022-08-15 DIAGNOSIS — F32A Depression, unspecified: Secondary | ICD-10-CM | POA: Diagnosis not present

## 2022-08-15 DIAGNOSIS — F0393 Unspecified dementia, unspecified severity, with mood disturbance: Secondary | ICD-10-CM | POA: Diagnosis not present

## 2022-08-15 DIAGNOSIS — J449 Chronic obstructive pulmonary disease, unspecified: Secondary | ICD-10-CM | POA: Diagnosis not present

## 2022-08-15 DIAGNOSIS — R269 Unspecified abnormalities of gait and mobility: Secondary | ICD-10-CM | POA: Diagnosis not present

## 2022-08-18 ENCOUNTER — Ambulatory Visit (INDEPENDENT_AMBULATORY_CARE_PROVIDER_SITE_OTHER): Payer: Medicare PPO | Admitting: Primary Care

## 2022-08-18 ENCOUNTER — Encounter: Payer: Self-pay | Admitting: Primary Care

## 2022-08-18 VITALS — BP 138/60 | HR 85 | Temp 98.5°F | Ht 62.0 in | Wt 105.0 lb

## 2022-08-18 DIAGNOSIS — Z01818 Encounter for other preprocedural examination: Secondary | ICD-10-CM | POA: Diagnosis not present

## 2022-08-18 DIAGNOSIS — J9611 Chronic respiratory failure with hypoxia: Secondary | ICD-10-CM | POA: Diagnosis not present

## 2022-08-18 DIAGNOSIS — G912 (Idiopathic) normal pressure hydrocephalus: Secondary | ICD-10-CM

## 2022-08-18 DIAGNOSIS — R34 Anuria and oliguria: Secondary | ICD-10-CM

## 2022-08-18 MED ORDER — NITROFURANTOIN MONOHYD MACRO 100 MG PO CAPS: 100.0000 mg | ORAL_CAPSULE | Freq: Two times a day (BID) | ORAL | 0 refills | Status: AC

## 2022-08-18 NOTE — Progress Notes (Signed)
Subjective:    Patient ID: Amanda Porter, female    DOB: [birth year], 76 y.o.   MRN: [id number]  HPI  Amanda Porter is a very pleasant 76 y.o. female with a history of hypertension, COPD, chronic respiratory failure with hypoxia, type 2 diabetes, dementia, normal pressure hydrocephalus, subdural hematoma, hyperlipidemia, recurrent falls, CVA, iron deficiency anemia, multiple lung nodules who presents today for preoperative clearance.  Her daughter is also needing a letter clearing her to return to her adult daycare center.  Her daughter joins [place] today who is providing most of the information for HPI.  Admitted to [facility] on 07/24/2022 for acute onset of altered mental status, agitation, urinary incontinence, unsteady gait.  Work-up in the ED revealed progressive ventriculomegaly compared to May 2023.  She was discharged to SNF on 08/02/2022, residing at [facility].  She was discharged home from [facility] today.   Since her discharge from the hospital she's feeling well. She underwent PT/OT while at SNF. She will be receiving home health PT/OT since discharge. Also following with Urology, plan is to continue with routine in and out catheters for urinary retention. She is currently compliant to her Macrobid.   Currently following with neurosurgery, Dr. Cari Caraway for normal pressure hydrocephalus.  She is currently pending ventriculoperitoneal shunt revision which is scheduled for 09/05/2022.   She is needing a letter stating that she's stable to return to [facility] for which she attends 3 days weekly. Her daughter endorses her mother has improved significantly since her hospital stay. She does have intermittent bouts of confusion which is her baseline. She will be seeing her pulmonologist within the next few weeks.  Review of Systems  Constitutional:  Negative for fever.  Respiratory:  Negative for shortness of breath.   Cardiovascular:  Negative for chest pain.   Gastrointestinal:  Negative for constipation and diarrhea.  Genitourinary:  Negative for difficulty urinating and hematuria.  Neurological:  Negative for dizziness and headaches.  Psychiatric/Behavioral:  Positive for confusion. The patient is not nervous/anxious.          Past Medical History:  Diagnosis Date   Acute respiratory failure requiring reintubation ([place]) 11/05/2021   Anxiety    Anxiety and depression    COPD (chronic obstructive pulmonary disease) (HCC)    CVA (cerebral vascular accident) ([place])    CVA (cerebral vascular accident) ([place])    L sided deficits   Dementia ([place])    Depression    Diabetes mellitus ([place])    Type 2   Diabetes mellitus without complication ([place])    Dysphagia    Dysphasia    History of lung cancer    Hyperlipidemia    Influenza A 12/13/2021   Memory disturbance 11/20/2017   Normal pressure hydrocephalus ([place]) 2018   Retinal detachment    Right   Right carotid bruit 11/20/2017   Tardive dyskinesia 02/01/2020   Tardive dyskinesia    Thrombosis    Arterial to lower extremity?    Social History   Socioeconomic History   Marital status: Divorced    Spouse name: Not on file   Number of children: 1   Years of education: trade school   Highest education level: Not on file  Occupational History   Occupation: Retired  Tobacco Use   Smoking status: Former    Packs/day: 0.50    Years: 50.00    Total pack years: 25.00    Types: Cigarettes    Quit date: 08/01/2017  Years since quitting: 5.0   Smokeless tobacco: Never  Vaping Use   Vaping Use: Never used  Substance and Sexual Activity   Alcohol use: Not Currently   Drug use: Not Currently   Sexual activity: Not Currently  Other Topics Concern   Not on file  Social History Narrative   ** Merged History Encounter **       Moved from [place]. Lives with daughter. Right handed    Social Determinants of Health   Financial Resource Strain: Not on file  Food Insecurity: No Food  Insecurity (12/27/2021)   Hunger Vital Sign    Worried About Running Out of Food in the Last Year: Never true    Ran Out of Food in the Last Year: Never true  Transportation Needs: No Transportation Needs (12/27/2021)   PRAPARE - Hydrologist (Medical): No    Lack of Transportation (Non-Medical): No  Physical Activity: Not on file  Stress: Not on file  Social Connections: Not on file  Intimate Partner Violence: Not on file    Past Surgical History:  Procedure Laterality Date   IR FL GUIDED LOC OF NEEDLE/CATH TIP FOR SPINAL INJECTION RT  07/25/2022   LUNG REMOVAL, PARTIAL     left upper lobe   VENTRICULOPERITONEAL SHUNT  12/09/2016   VENTRICULOPERITONEAL SHUNT      Family History  Problem Relation Age of Onset   Pneumonia Mother    Cancer Father    Diabetes Sister    Diabetes Sister    Breast cancer Neg Hx     Allergies  Allergen Reactions   Lexapro [Escitalopram Oxalate] Other (See Comments)    Dizziness, nausea, fatigue   Neosporin  [Neomycin-Polymyxin-Gramicidin] Rash    (Neosporin)   Neosporin [Bacitracin-Polymyxin B] Rash    Current Outpatient Medications on File Prior to Visit  Medication Sig Dispense Refill   acetaminophen (TYLENOL) 325 MG tablet Take 2 tablets (650 mg total) by mouth every 6 (six) hours as needed for mild pain (or Fever >/= 101).     albuterol (PROVENTIL) (2.5 MG/3ML) 0.083% nebulizer solution USE 1 VIAL IN NEBULIZER EVERY 6 HOURS (Patient taking differently: Take 2.5 mg by nebulization every 6 (six) hours as needed for wheezing or shortness of breath.) 360 mL 11   albuterol (VENTOLIN HFA) 108 (90 Base) MCG/ACT inhaler INHALE 2 PUFFS BY MOUTH EVERY 4 HOURS AS NEEDED 18 g 12   amLODipine (NORVASC) 2.5 MG tablet Take 1 tablet (2.5 mg total) by mouth daily.     buPROPion (WELLBUTRIN XL) 300 MG 24 hr tablet Take 1 tablet (300 mg total) by mouth daily with breakfast.     busPIRone (BUSPAR) 15 MG tablet Take 45 mg by mouth in  the morning.     busPIRone (BUSPAR) 30 MG tablet Take 1 tablet (30 mg total) by mouth at bedtime.     clonazePAM (KLONOPIN) 1 MG tablet Take 1 mg by mouth at bedtime.     Cyanocobalamin (B-12 PO) Take 1 capsule by mouth daily.     folic acid (FOLVITE) 1 MG tablet Take 1 tablet (1 mg total) by mouth daily. 90 tablet 3   glipiZIDE (GLUCOTROL XL) 2.5 MG 24 hr tablet TAKE 1 TABLET (2.5 MG TOTAL) BY MOUTH DAILY WITH BREAKFAST. FOR DIABETES. 90 tablet 0   glucose blood test strip Use as instructed 100 each 12   guaiFENesin (MUCINEX) 600 MG 12 hr tablet Take 600 mg by mouth as needed.  memantine (NAMENDA) 5 MG tablet Take 1 tablet (5 mg total) by mouth 2 (two) times daily. For memory. 180 tablet 3   nitrofurantoin, macrocrystal-monohydrate, (MACROBID) 100 MG capsule Take 1 capsule (100 mg total) by mouth 2 (two) times daily for 3 days. 6 capsule 0   OXYGEN 3 L continuous.      rosuvastatin (CRESTOR) 20 MG tablet TAKE 1 TABLET (20 MG TOTAL) BY MOUTH EVERY EVENING. FOR CHOLESTEROL. 90 tablet 3   tamsulosin (FLOMAX) 0.4 MG CAPS capsule Take 1 capsule (0.4 mg total) by mouth daily. For urine retention (Patient taking differently: Take 0.4 mg by mouth daily after supper. For urine retention) 90 capsule 1   Tiotropium Bromide Monohydrate (SPIRIVA RESPIMAT) 1.25 MCG/ACT AERS Inhale 2 puffs into the lungs daily. 2 each 0   Tiotropium Bromide-Olodaterol (STIOLTO RESPIMAT) 2.5-2.5 MCG/ACT AERS INHALE 2 PUFFS BY MOUTH INTO THE LUNGS DAILY (Patient taking differently: Inhale 2 puffs into the lungs daily.) 12 g 4   Accu-Chek Softclix Lancets lancets Use as instructed 100 each 12   No current facility-administered medications on file prior to visit.    BP 138/60   Pulse 85   Temp 98.5 F (36.9 C) (Temporal)   Ht 5\' 2"  (1.575 m)   Wt 105 lb (47.6 kg)   SpO2 99%   BMI 19.20 kg/m  Objective:   Physical Exam Cardiovascular:     Rate and Rhythm: Normal rate and regular rhythm.  Pulmonary:     Effort:  Pulmonary effort is normal.     Breath sounds: Normal breath sounds.  Abdominal:     General: Bowel sounds are normal.     Palpations: Abdomen is soft.     Tenderness: There is no abdominal tenderness.  Musculoskeletal:     Cervical back: Neck supple.  Skin:    General: Skin is warm and dry.  Neurological:     Mental Status: She is alert.     Comments: Follows commands, cannot fully participate in HPI  Psychiatric:        Mood and Affect: Mood normal.           Assessment & Plan:   Problem List Items Addressed This Visit       Respiratory   Chronic respiratory failure with hypoxia (HCC)    Appears stable. Following with pulmonology. Patient will need pulmonology sign off for preoperative clearance. Patient and family aware.        Nervous and Auditory   Normal pressure hydrocephalus (HCC) - Primary    Pending ventriculoperitoneal shunt revision for 09/05/22 per Dr. Cari Caraway.  Reviewed ED and hospital notes from August and September 2023.  Repeat CBC and BMP pending today. ECG today with NSR, rate of 86, BBB noted. No PAC/PVC, no acute ST changes. We could not pull up the ECG from May 2023 through [facility] health or the image of ECG from June 2023 through [place]. According to the ECG write up from June 2023, this ECG appears similar. No image available.   Await results, anticipate clearance from our end. She will need pulmonology to sign off for surgery.        Other   Decreased urine output    Continue with I&O catheterizations. Following with Urology.  Continue Macrobid until complete.  Reviewed ED and hospital notes from August/September 2023.      Preoperative clearance    Pending ventriculoperitoneal shunt revision for 09/05/22 per Dr. Cari Caraway.  Reviewed ED and hospital notes from August and  September 2023.  Repeat CBC and BMP pending today. ECG today with NSR, rate of 86, BBB noted. No PAC/PVC, no acute ST changes. We could not pull up the  ECG from May 2023 through [facility] health or the image of ECG from June 2023 through [place]. According to the ECG write up from June 2023, this ECG appears similar. No image available.   Await results, anticipate clearance from our end. She will need pulmonology to sign off for surgery.      Relevant Orders   EKG 12-Lead (Completed)   CBC   Basic metabolic panel       Pleas Koch, NP

## 2022-08-18 NOTE — Assessment & Plan Note (Signed)
Continue with I&O catheterizations. Following with Urology.  Continue Macrobid until complete.  Reviewed ED and hospital notes from August/September 2023.

## 2022-08-18 NOTE — Patient Instructions (Signed)
Stop by the lab prior to leaving today. I will notify you of your results once received.   It was a pleasure to see you today!  

## 2022-08-18 NOTE — Assessment & Plan Note (Signed)
Pending ventriculoperitoneal shunt revision for 09/05/22 per Dr. Cari Caraway.  Reviewed ED and hospital notes from August and September 2023.  Repeat CBC and BMP pending today. ECG today with NSR, rate of 86, BBB noted. No PAC/PVC, no acute ST changes. We could not pull up the ECG from May 2023 through [facility] health or the image of ECG from June 2023 through [place]. According to the ECG write up from June 2023, this ECG appears similar. No image available.   Await results, anticipate clearance from our end. She will need pulmonology to sign off for surgery.

## 2022-08-18 NOTE — Assessment & Plan Note (Signed)
Appears stable. Following with pulmonology. Patient will need pulmonology sign off for preoperative clearance. Patient and family aware.

## 2022-08-18 NOTE — Assessment & Plan Note (Addendum)
Pending ventriculoperitoneal shunt revision for 09/05/22 per Dr. Cari Caraway.  Reviewed ED and hospital notes from August and September 2023.  Repeat CBC and BMP pending today. ECG today with NSR, rate of 86, BBB noted. No PAC/PVC, no acute ST changes. We could not pull up the ECG from May 2023 through [facility] health or the image of ECG from June 2023 through [place]. According to the ECG write up from June 2023, this ECG appears similar. No image available.   Await results, anticipate clearance from our end. She will need pulmonology to sign off for surgery.

## 2022-08-18 NOTE — Telephone Encounter (Signed)
Spoke with patient's daughter, she does not have any availability to bring pt in, she can not take off any days before the surgery. Daughter states she knows how to in and out cath, what more does she need to know? Daughter works in the ED on Wednesday from 11-3 and have no other caregiver for her mom. Every appt I found on both PA schedule was not good for her.

## 2022-08-19 DIAGNOSIS — I10 Essential (primary) hypertension: Secondary | ICD-10-CM

## 2022-08-19 LAB — CBC
HCT: 28.3 % — ABNORMAL LOW (ref 36.0–46.0)
Hemoglobin: 9.2 g/dL — ABNORMAL LOW (ref 12.0–15.0)
MCHC: 32.5 g/dL (ref 30.0–36.0)
MCV: 87.9 fl (ref 78.0–100.0)
Platelets: 318 10*3/uL (ref 150.0–400.0)
RBC: 3.23 Mil/uL — ABNORMAL LOW (ref 3.87–5.11)
RDW: 16.2 % — ABNORMAL HIGH (ref 11.5–15.5)
WBC: 7.8 10*3/uL (ref 4.0–10.5)

## 2022-08-19 LAB — BASIC METABOLIC PANEL
BUN: 16 mg/dL (ref 6–23)
CO2: 33 mEq/L — ABNORMAL HIGH (ref 19–32)
Calcium: 9.3 mg/dL (ref 8.4–10.5)
Chloride: 102 mEq/L (ref 96–112)
Creatinine, Ser: 0.64 mg/dL (ref 0.40–1.20)
GFR: 85.21 mL/min (ref >60.00)
Glucose, Bld: 140 mg/dL — ABNORMAL HIGH (ref 70–99)
Potassium: 3.7 mEq/L (ref 3.5–5.1)
Sodium: 141 mEq/L (ref 135–145)

## 2022-08-19 MED ORDER — AMLODIPINE BESYLATE 2.5 MG PO TABS: 2.5000 mg | ORAL_TABLET | Freq: Every day | ORAL | 1 refills | Status: DC

## 2022-08-20 ENCOUNTER — Ambulatory Visit: Payer: Medicare PPO | Admitting: Physician Assistant

## 2022-08-20 ENCOUNTER — Telehealth: Payer: Self-pay | Admitting: Internal Medicine

## 2022-08-20 VITALS — BP 129/66 | HR 96 | Wt 105.0 lb

## 2022-08-20 DIAGNOSIS — N390 Urinary tract infection, site not specified: Secondary | ICD-10-CM

## 2022-08-20 DIAGNOSIS — N362 Urethral caruncle: Secondary | ICD-10-CM | POA: Diagnosis not present

## 2022-08-20 DIAGNOSIS — R339 Retention of urine, unspecified: Secondary | ICD-10-CM

## 2022-08-20 DIAGNOSIS — B3731 Acute candidiasis of vulva and vagina: Secondary | ICD-10-CM

## 2022-08-20 LAB — MICROSCOPIC EXAMINATION

## 2022-08-20 LAB — URINALYSIS, COMPLETE
Bilirubin, UA: NEGATIVE
Glucose, UA: NEGATIVE
Ketones, UA: NEGATIVE
Leukocytes,UA: NEGATIVE
Nitrite, UA: NEGATIVE
RBC, UA: NEGATIVE
Specific Gravity, UA: 1.025 (ref 1.005–1.030)
Urobilinogen, Ur: 0.2 mg/dL (ref 0.2–1.0)
pH, UA: 6 (ref 5.0–7.5)

## 2022-08-20 LAB — BLADDER SCAN AMB NON-IMAGING: Scan Result: 0

## 2022-08-20 MED ORDER — PREMARIN 0.625 MG/GM VA CREA: TOPICAL_CREAM | VAGINAL | 4 refills | Status: DC

## 2022-08-20 MED ORDER — MICONAZOLE NITRATE 2 % VA CREA: 1.0000 | TOPICAL_CREAM | Freq: Every day | VAGINAL | 0 refills | Status: DC

## 2022-08-20 NOTE — Patient Instructions (Signed)
Start miconazole cream x1 week to treat possible yeast infection. If her labial redness does not get better, let me know and I'll send her to dermatology for further evaluation. After she finishes the miconazole cream above, start topical vaginal estrogen cream for UTI prevention. Apply one pea-sized amount around the opening of the urethra every day for 2 weeks, then 3 times per week forever. In and out cath as needed; we will place a supply order for you to receive catheter supplies at your home. I will call with your urine culture results. Please come back and see me as needed for recurrent UTIs. Good luck with your surgery next week!    Step 1 Get all of your supplies ready and place near you. Step 2 Wash your hands or put on gloves. Step 3 Wash around urethral opening with warm antibacterial/ hypoallergenic soapy water from front to back. Step 4 take the catheter out of the package and drain the lubricant over toilet. Step 5 Sit on the toilet and spread your legs to begin catheterization. Step 6 Use your fingers to spread the labia and feel for the urethra.             A MIRROR MAY BE HELPFUL AT FIRST Step 7 Insert the catheter slowly into the urethra. If there is resistance when the catheter reaches the the sphincter muscle,              take a deep breath and gently apply steady pressure.            DO NOT FORCE THE CATHETER Step 8 When the urine begins to flow insert another inch, allow the urine to flow into the toilet. Step 9 When the flow of urine stops, slowly remove the catheter.

## 2022-08-20 NOTE — Telephone Encounter (Signed)
Fax received from Dr. Meade Maw with [facility] to perform a ventriculoperitoneal shunt revision on patient.  Patient needs surgery clearance. Surgery is 09/05/2022. Patient was seen on 06/30/2022. Office protocol is a risk assessment can be sent to surgeon if patient has been seen in 60 days or less.   Sending to Dr Annamaria Boots for risk assessment or recommendations if patient needs to be seen in office prior to surgical procedure.    Sir do you want me to get her seen this month before her surgery. Please just let me know.

## 2022-08-20 NOTE — Progress Notes (Signed)
08/20/2022 10:51 AM   [name] 1946-08-13 [id number]  CC: Chief Complaint  Patient presents with   cath ua   HPI: Amanda Porter is a 76 y.o. female with PMH dementia, DM2, CVA, intermittent difficulty voiding, possible recurrent UTI, and normal pressure hydrocephalus s/p VP shunt recently admitted with shunt malfunction and scheduled for revision with Dr. [name] on 09/05/2022 who presents today for CIC teaching and cath UA. She is accompanied today by her daughter, Amanda Porter, with whom she lives.  Today she reports no acute concerns. She remains on Macrobid for possible UTI.  They reaffirm today that she does not always have difficulty voiding. She had been undergoing I&O catheterization at [facility] due to concerns for possible urinary retention vs incomplete bladder emptying, but her urinary output tended to be <221mL. She was discharged home yesterday and spontaneously voided several times without dysuria. She remains on Flomax at bedtime and is tolerating it well.  Bladder scan on arrival today is 32mL.  Catheterized UA today notable for 1+ protein; urine microscopy with granular casts and moderate bacteria.  PMH: Past Medical History:  Diagnosis Date   Acute respiratory failure requiring reintubation ([place]) 11/05/2021   Anxiety    Anxiety and depression    COPD (chronic obstructive pulmonary disease) (HCC)    CVA (cerebral vascular accident) ([place])    CVA (cerebral vascular accident) ([place])    L sided deficits   Dementia ([place])    Depression    Diabetes mellitus ([place])    Type 2   Diabetes mellitus without complication ([place])    Dysphagia    Dysphasia    History of lung cancer    Hyperlipidemia    Influenza A 12/13/2021   Memory disturbance 11/20/2017   Normal pressure hydrocephalus ([place]) 2018   Retinal detachment    Right   Right carotid bruit 11/20/2017   Tardive dyskinesia 02/01/2020   Tardive dyskinesia    Thrombosis    Arterial to lower extremity?     Surgical History: Past Surgical History:  Procedure Laterality Date   IR FL GUIDED LOC OF NEEDLE/CATH TIP FOR SPINAL INJECTION RT  07/25/2022   LUNG REMOVAL, PARTIAL     left upper lobe   VENTRICULOPERITONEAL SHUNT  12/09/2016   VENTRICULOPERITONEAL SHUNT      Home Medications:  Allergies as of 08/20/2022       Reactions   Lexapro [escitalopram Oxalate] Other (See Comments)   Dizziness, nausea, fatigue   Neosporin  [neomycin-polymyxin-gramicidin] Rash   (Neosporin)   Neosporin [bacitracin-polymyxin B] Rash        Medication List        Accurate as of August 20, 2022 10:51 AM. If you have any questions, ask your nurse or doctor.          Accu-Chek Softclix Lancets lancets Use as instructed   acetaminophen 325 MG tablet Commonly known as: TYLENOL Take 2 tablets (650 mg total) by mouth every 6 (six) hours as needed for mild pain (or Fever >/= 101).   albuterol (2.5 MG/3ML) 0.083% nebulizer solution Commonly known as: PROVENTIL USE 1 VIAL IN NEBULIZER EVERY 6 HOURS What changed: See the new instructions.   albuterol 108 (90 Base) MCG/ACT inhaler Commonly known as: VENTOLIN HFA INHALE 2 PUFFS BY MOUTH EVERY 4 HOURS AS NEEDED What changed: Another medication with the same name was changed. Make sure you understand how and when to take each.   amLODipine 2.5 MG tablet Commonly known as: NORVASC Take  1 tablet (2.5 mg total) by mouth daily. for blood pressure.   B-12 PO Take 1 capsule by mouth daily.   buPROPion 300 MG 24 hr tablet Commonly known as: WELLBUTRIN XL Take 1 tablet (300 mg total) by mouth daily with breakfast.   busPIRone 15 MG tablet Commonly known as: BUSPAR Take 45 mg by mouth in the morning.   busPIRone 30 MG tablet Commonly known as: BUSPAR Take 1 tablet (30 mg total) by mouth at bedtime.   clonazePAM 1 MG tablet Commonly known as: KLONOPIN Take 1 mg by mouth at bedtime.   folic acid 1 MG tablet Commonly known as:  FOLVITE Take 1 tablet (1 mg total) by mouth daily.   glipiZIDE 2.5 MG 24 hr tablet Commonly known as: GLUCOTROL XL TAKE 1 TABLET (2.5 MG TOTAL) BY MOUTH DAILY WITH BREAKFAST. FOR DIABETES.   glucose blood test strip Use as instructed   guaiFENesin 600 MG 12 hr tablet Commonly known as: MUCINEX Take 600 mg by mouth as needed.   memantine 5 MG tablet Commonly known as: NAMENDA Take 1 tablet (5 mg total) by mouth 2 (two) times daily. For memory.   miconazole 2 % vaginal cream Commonly known as: MONISTAT 7 Place 1 Applicatorful vaginally at bedtime.   nitrofurantoin (macrocrystal-monohydrate) 100 MG capsule Commonly known as: Macrobid Take 1 capsule (100 mg total) by mouth 2 (two) times daily for 3 days.   OXYGEN 3 L continuous.   Premarin vaginal cream Generic drug: conjugated estrogens Apply one pea-sized amount around the opening of the urethra daily for 2 weeks, then 3 times weekly moving forward.   rosuvastatin 20 MG tablet Commonly known as: CRESTOR TAKE 1 TABLET (20 MG TOTAL) BY MOUTH EVERY EVENING. FOR CHOLESTEROL.   Spiriva Respimat 1.25 MCG/ACT Aers Generic drug: Tiotropium Bromide Monohydrate Inhale 2 puffs into the lungs daily.   Stiolto Respimat 2.5-2.5 MCG/ACT Aers Generic drug: Tiotropium Bromide-Olodaterol INHALE 2 PUFFS BY MOUTH INTO THE LUNGS DAILY What changed: See the new instructions.   tamsulosin 0.4 MG Caps capsule Commonly known as: FLOMAX Take 1 capsule (0.4 mg total) by mouth daily. For urine retention What changed: when to take this        Allergies:  Allergies  Allergen Reactions   Lexapro [Escitalopram Oxalate] Other (See Comments)    Dizziness, nausea, fatigue   Neosporin  [Neomycin-Polymyxin-Gramicidin] Rash    (Neosporin)   Neosporin [Bacitracin-Polymyxin B] Rash    Family History: Family History  Problem Relation Age of Onset   Pneumonia Mother    Cancer Father    Diabetes Sister    Diabetes Sister    Breast cancer  Neg Hx     Social History:   reports that she quit smoking about 5 years ago. Her smoking use included cigarettes. She has a 25.00 pack-year smoking history. She has never used smokeless tobacco. She reports that she does not currently use alcohol. She reports that she does not currently use drugs.  Physical Exam: BP 129/66   Pulse 96   Wt 105 lb (47.6 kg)   BMI 19.20 kg/m   Constitutional:  Alert and oriented, no acute distress, nontoxic appearing HEENT: [place], AT Cardiovascular: No clubbing, cyanosis, or edema Respiratory: Normal respiratory effort, no increased work of breathing GU: Well demarcated, erythematous patch at the superior labia majora/mons pubis with some white scaling at the margins. Hypopigmented labia minora. Urethral caruncle. Skin: No rashes, bruises or suspicious lesions Neurologic: Grossly intact, no focal deficits, moving all 4  extremities Psychiatric: Normal mood and affect  Laboratory Data: Results for orders placed or performed in visit on 08/20/22  Bladder Scan (Post Void Residual) in office  Result Value Ref Range   Scan Result 0 ml    Continuous Intermittent Catheterization  Due to intermittent difficulty voiding patient is present today for a teaching of I & O Catheterization by her daughter, Amanda Porter, with whom she lives. Patient was given detailed verbal and printed instructions of self catheterization. Patient was cleaned and prepped in a clean fashion.  With instruction I demonstrated insertion of a [place] 14Fr female catheter  and urine return was noted 25 ml, urine was yellow in color. Patient tolerated well, no complications were noted Patient was given a sample bag with supplies to take home.  Instructions were given for patient's daughter to cath her as needed.  An order was placed with [facility] for catheters to be sent to the patient's home. Patient is to follow up as needed for possible UTI.  Performed by: Debroah Loop, PA-C   Assessment & Plan:   1. Incomplete bladder emptying Bladder scan WNL upon arrival today.  With reports of only intermittent episodes of difficulty voiding, I recommend in and out catheterization at home on an as-needed basis only.  Patient and Amanda Porter are in agreement with this plan.  I demonstrated in and out catheterization in clinic today for [facility].  She has performed this procedure before and is comfortable with it.  All questions answered.  We will place a [facility] order as above.  I sent her home with samples today. - Bladder Scan (Post Void Residual) in office  2. Urethral caruncle Noted on physical exam today.  With possible recurrent UTIs as below, I recommend starting topical vaginal estrogen cream and they are in agreement with this plan.  We discussed using it daily for 2 weeks and then reducing frequency to 3 times weekly forever. - conjugated estrogens (PREMARIN) vaginal cream; Apply one pea-sized amount around the opening of the urethra daily for 2 weeks, then 3 times weekly moving forward.  Dispense: 30 g; Refill: 4  3. Vulvovaginal candidiasis Possible candidiasis noted on physical exam today.  We will treat with topical miconazole but if her skin changes do not resolve, we will have her follow-up with dermatology for evaluation of possible lichen sclerosis. - miconazole (MONISTAT 7) 2 % vaginal cream; Place 1 Applicatorful vaginally at bedtime.  Dispense: 45 g; Refill: 0  4. Recurrent UTI We again had a lengthy conversation about her urinary symptoms, with normal pressure hydrocephalus, dementia, and stroke making UTI symptoms more challenging to determine.  I recommend that we plan to always obtain a urine culture prior to treating her with antibiotics and have asked them to start seeing me in clinic with concerns for UTIs.  We also discussed the possibility of urinary colonization, though with multiple negative urine cultures in the last year I do not think  this is as likely.  Urine sample obtained today for UA and culture, though I suspect this will be negative given that she is currently on Macrobid. - Urinalysis, Complete - CULTURE, URINE COMPREHENSIVE  Return if symptoms worsen or fail to improve.  Debroah Loop, PA-C  [facility] [address] [place], [place] [address] [phone]

## 2022-08-23 LAB — CULTURE, URINE COMPREHENSIVE

## 2022-08-25 ENCOUNTER — Telehealth: Payer: Self-pay | Admitting: Urgent Care

## 2022-08-25 ENCOUNTER — Encounter
Admission: RE | Admit: 2022-08-25 | Discharge: 2022-08-25 | Disposition: A | Discharge status: Home / Self Care | Payer: Medicare PPO | Source: Ambulatory Visit | Attending: Neurosurgery | Admitting: Neurosurgery

## 2022-08-25 DIAGNOSIS — Z22322 Carrier or suspected carrier of Methicillin resistant Staphylococcus aureus: Secondary | ICD-10-CM

## 2022-08-25 DIAGNOSIS — N390 Urinary tract infection, site not specified: Secondary | ICD-10-CM | POA: Diagnosis not present

## 2022-08-25 DIAGNOSIS — E876 Hypokalemia: Secondary | ICD-10-CM | POA: Diagnosis not present

## 2022-08-25 DIAGNOSIS — I10 Essential (primary) hypertension: Secondary | ICD-10-CM

## 2022-08-25 DIAGNOSIS — R338 Other retention of urine: Secondary | ICD-10-CM

## 2022-08-25 DIAGNOSIS — E785 Hyperlipidemia, unspecified: Secondary | ICD-10-CM

## 2022-08-25 DIAGNOSIS — D649 Anemia, unspecified: Secondary | ICD-10-CM | POA: Diagnosis not present

## 2022-08-25 DIAGNOSIS — Z01818 Encounter for other preprocedural examination: Secondary | ICD-10-CM

## 2022-08-25 DIAGNOSIS — A4902 Methicillin resistant Staphylococcus aureus infection, unspecified site: Secondary | ICD-10-CM | POA: Diagnosis not present

## 2022-08-25 DIAGNOSIS — R42 Dizziness and giddiness: Secondary | ICD-10-CM

## 2022-08-25 DIAGNOSIS — Z01812 Encounter for preprocedural laboratory examination: Secondary | ICD-10-CM

## 2022-08-25 DIAGNOSIS — E1165 Type 2 diabetes mellitus with hyperglycemia: Secondary | ICD-10-CM

## 2022-08-25 HISTORY — DX: Carrier or suspected carrier of methicillin resistant Staphylococcus aureus: Z22.322

## 2022-08-25 HISTORY — DX: Gastro-esophageal reflux disease without esophagitis: K21.9

## 2022-08-25 HISTORY — DX: Anemia, unspecified: D64.9

## 2022-08-25 HISTORY — DX: Essential (primary) hypertension: I10

## 2022-08-25 LAB — BASIC METABOLIC PANEL
Anion gap: 8 (ref 5–15)
BUN: 11 mg/dL (ref 8–23)
CO2: 35 mmol/L — ABNORMAL HIGH (ref 22–32)
Calcium: 9.1 mg/dL (ref 8.9–10.3)
Chloride: 100 mmol/L (ref 98–111)
Creatinine, Ser: 0.53 mg/dL (ref 0.44–1.00)
GFR, Estimated: >60 mL/min (ref >60)
Glucose, Bld: 143 mg/dL — ABNORMAL HIGH (ref 70–99)
Potassium: 3.1 mmol/L — ABNORMAL LOW (ref 3.5–5.1)
Sodium: 143 mmol/L (ref 135–145)

## 2022-08-25 LAB — CBC
HCT: 32.2 % — ABNORMAL LOW (ref 36.0–46.0)
Hemoglobin: 9.5 g/dL — ABNORMAL LOW (ref 12.0–15.0)
MCH: 27.1 pg (ref 26.0–34.0)
MCHC: 29.5 g/dL — ABNORMAL LOW (ref 30.0–36.0)
MCV: 92 fL (ref 80.0–100.0)
Platelets: 268 10*3/uL (ref 150–400)
RBC: 3.5 MIL/uL — ABNORMAL LOW (ref 3.87–5.11)
RDW: 14.9 % (ref 11.5–15.5)
WBC: 7.6 10*3/uL (ref 4.0–10.5)
nRBC: 0 % (ref 0.0–0.2)

## 2022-08-25 LAB — TYPE AND SCREEN
ABO/RH(D): B POS
Antibody Screen: NEGATIVE

## 2022-08-25 LAB — SURGICAL PCR SCREEN
MRSA, PCR: POSITIVE — AB
Staphylococcus aureus: POSITIVE — AB

## 2022-08-25 MED ORDER — POTASSIUM CHLORIDE ER 10 MEQ PO TBCR: 10.0000 meq | EXTENDED_RELEASE_TABLET | Freq: Every day | ORAL | 0 refills | Status: DC

## 2022-08-25 NOTE — Patient Instructions (Signed)
Your procedure is scheduled on: 09/05/22 Report to [facility]. To find out your arrival time please call [phone] between 1PM - 3PM on 09/04/22.  Remember: Instructions that are not followed completely may result in serious medical risk, up to and including death, or upon the discretion of your surgeon and anesthesiologist your surgery may need to be rescheduled.     _X__ 1. Do not eat food after midnight the night before your procedure.                 No gum chewing or hard candies. You may drink clear liquids up to 2 hours                 before you are scheduled to arrive for your surgery- DO not drink clear                 liquids within 2 hours of arrival Diabetics water only  __X__2.  On the morning of surgery brush your teeth with toothpaste and water, you                 may rinse your mouth with mouthwash if you wish.  Do not swallow any              toothpaste of mouthwash.     _X__ 3.  No Alcohol for 24 hours before or after surgery.   _X__ 4.  Do Not Smoke or use e-cigarettes For 24 Hours Prior to Your Surgery.                 Do not use any chewable tobacco products for at least 6 hours prior to                 surgery.  ____  5.  Bring all medications with you on the day of surgery if instructed.   __X__  6.  Notify your doctor if there is any change in your medical condition      (cold, fever, infections).     Do not wear jewelry, make-up, hairpins, clips or nail polish. Do not wear lotions, powders, or perfumes.  Do not shave body hair 48 hours prior to surgery. Men may shave face and neck. Do not bring valuables to the hospital.    [facility] is not responsible for any belongings or valuables.  Contacts, dentures/partials or body piercings may not be worn into surgery. Bring a case for your contacts, glasses or hearing aids, a denture cup will be supplied. Leave your suitcase in the car. After surgery it may  be brought to your room. For patients admitted to the hospital, discharge time is determined by your treatment team.   Patients discharged the day of surgery will not be allowed to drive home.   Please read over the following fact sheets that you were given:   MRSA Information  __X__ Take these medicines the morning of surgery with A SIP OF WATER:    1. buPROPion (WELLBUTRIN XL) 300 MG 24 hr tablet  2. busPIRone (BUSPAR) 45 MG tablet  3. memantine (NAMENDA) 5 MG tablet  4.  5.  6.  ____ Fleet Enema (as directed)   __X__ Use CHG Soap/SAGE wipes as directed  __X__ Use inhalers on the day of surgery  Nebulizer treatment as well and bring her rescue inhaler  ____ Stop metformin/Janumet/Farxiga 2 days prior to surgery    ____ Take 1/2  of usual insulin dose the night before surgery. No insulin the morning          of surgery.   ____ Stop Blood Thinners Coumadin/Plavix/Xarelto/Pleta/Pradaxa/Eliquis/Effient/Aspirin  on   Or contact your Surgeon, Cardiologist or Medical Doctor regarding  ability to stop your blood thinners  __X__ Stop Anti-inflammatories 7 days before surgery such as Advil, Ibuprofen, Motrin,  BC or Goodies Powder, Naprosyn, Naproxen, Aleve, Aspirin    __X__ Stop all herbals and supplements, fish oil or vitamins for 7 days  until after surgery.    ____ Bring C-Pap to the hospital.

## 2022-08-25 NOTE — Progress Notes (Signed)
  Perioperative Services  Abnormal Lab Notification and Treatment Plan of Care  Date: 08/25/22  Name: Amanda Porter MRN:   [id number]  Re: Abnormal labs noted during PAT appointment  Provider Notified: Meade Maw, MD Notification mode: Routed and/or faxed via [place] of concern: Lab Results  Component Value Date   STAPHAUREUS POSITIVE (A) 08/25/2022   MRSAPCR POSITIVE (A) 08/25/2022    Notes: Patient is scheduled for a VENTRICULOPERITONEAL SHUNT REVISION on 09/05/2022. She is scheduled to receive CEFAZOLIN + VANCOMYCIN pre-operatively. Surgical PCR (+) for MRSA; see above.  PLANS:  Review renal function. Estimated Creatinine Clearance: 44.3 mL/min (by C-G formula based on SCr of 0.53 mg/dL). Review allergies. No documented allergy to vancomycin. Patient with orders for both CEFAZOLIN + VANCOMYCIN to be given in the setting of documented MRSA (+) surgical PCR.   Guidelines suggest that a beta-lactam antibiotic (first or second generation cephalosporin) should be added for activity against gram-negative organisms.  Vancomycin appears to be less effective than cefazolin for preventing SSIs caused by MSSA. For this reason, the use of vancomycin in combination with cefazolin is favored for prevention of SSI due to MRSA and coagulase-negative staphylococci.  Notify primary attending surgeon of (+) MRSA result. Order has already been entered for this medication. No further action required.   Honor Loh, MSN, APRN, FNP-C, CEN [facility]  Peri-operative Services Nurse Practitioner Phone: [phone] 08/25/22 1:56 PM

## 2022-08-25 NOTE — Progress Notes (Signed)
  [facility] Perioperative Services: Pre-Admission/Anesthesia Testing  Abnormal Lab Notification and Treatment Plan of Care   Date: 08/25/22  Name: Amanda Porter MRN:   [id number]  Re: Abnormal labs noted during PAT appointment   Notified:  Provider Name Provider Role Notification Mode  Elayne Guerin, MD Neurosurgery (Surgeon) Routed and/or faxed via Lionel December, [name], NP Primary Care Provider Routed and/or faxed via [place] and Notes:  ABNORMAL LAB VALUE(S): Lab Results  Component Value Date   K 3.1 (L) 08/25/2022   Amanda Porter is scheduled for a ventriculoperitoneal shunt revision on 09/05/2022. In review of her medication reconciliation, it is noted that the patient is not taking prescribed diuretic medications Please note, in efforts to promote a safe and effective anesthetic course, per current guidelines/standards set by the [facility] anesthesia team, the minimal acceptable K+ level for the patient to proceed with general anesthesia is 3.0 mmol/L. With that being said, if the patient drops any lower, her elective procedure will need to be postponed until K+ is better optimized. In efforts to prevent case cancellation, will make efforts to optimize pre-surgical K+ level so that patient can safely undergo the planned surgical intervention.   Impression and Plan:  Amanda Porter found to be HYPOkalemic at 3.1 mmol/L on preoperative labs. Called patient to discuss results and plans for correction of noted electrolyte derangement as follows:  Meds ordered this encounter  Medications   potassium chloride (KLOR-CON) 10 MEQ tablet    Sig: Take 1 tablet (10 mEq total) by mouth daily for 7 days.    Dispense:  7 tablet    Refill:  0   Encouraged patient's daughter to have patient to follow up with PCP about 2-3 weeks postoperatively to have labs rechecked to ensure that levels are remaining within normal range. Discussed nutritional intake  of K+ rich foods as an adjunctive way to keep her K+ levels normal; list of K+ rich foods verbally provided. Also mentioned ORS, however advised her daughter to not have her rely solely on these drinks, as they are high in Na+, and she has a HTN diagnosis.   Will send copy of this note to surgeon and PCP to make them aware of K+ level and plans for correction. Order entered to recheck K+ on the day of her surgery to ensure optimization.  Patient's daughter indicated understanding of POC as it stands at this point. Wished patient the best of luck with her upcoming surgery and subsequent recovery. Daughter was encouraged to return call to the PAT clinic, or to the surgeon's office, should any questions or concerns arise between now and the time of her surgery. Patient's daughter was appreciative of the care/concern expressed by PAT staff.   Encounter Diagnoses  Name Primary?   Pre-operative laboratory examination Yes   Hypokalemia    Honor Loh, MSN, APRN, FNP-C, CEN [facility]  Peri-operative Services Nurse Practitioner Phone: [phone] 08/25/22 2:49 PM  NOTE: This note has been prepared using Dragon dictation software. Despite my best ability to proofread, there is always the potential that unintentional transcriptional errors may still occur from this process.

## 2022-08-26 DIAGNOSIS — J449 Chronic obstructive pulmonary disease, unspecified: Secondary | ICD-10-CM | POA: Diagnosis not present

## 2022-08-27 ENCOUNTER — Telehealth (HOSPITAL_COMMUNITY): Payer: Self-pay

## 2022-08-27 ENCOUNTER — Ambulatory Visit (HOSPITAL_COMMUNITY)
Admission: RE | Admit: 2022-08-27 | Discharge: 2022-08-27 | Disposition: A | Discharge status: Home / Self Care | Payer: Medicare PPO | Source: Ambulatory Visit | Attending: Internal Medicine | Admitting: Internal Medicine

## 2022-08-27 DIAGNOSIS — R918 Other nonspecific abnormal finding of lung field: Secondary | ICD-10-CM | POA: Insufficient documentation

## 2022-08-27 DIAGNOSIS — R911 Solitary pulmonary nodule: Secondary | ICD-10-CM | POA: Diagnosis not present

## 2022-08-27 DIAGNOSIS — J439 Emphysema, unspecified: Secondary | ICD-10-CM | POA: Diagnosis not present

## 2022-08-28 ENCOUNTER — Other Ambulatory Visit: Payer: Self-pay

## 2022-08-28 DIAGNOSIS — R918 Other nonspecific abnormal finding of lung field: Secondary | ICD-10-CM

## 2022-08-28 NOTE — Telephone Encounter (Signed)
Sorry for delay as we waited for chest CT result. This is stable. She has severe emphysema and atherosclerosis. We are watching lung nodules. She is clear from a pulmonary standpoint for necessary shunt revision.

## 2022-08-29 ENCOUNTER — Encounter: Payer: Self-pay | Admitting: Internal Medicine

## 2022-08-29 NOTE — Telephone Encounter (Signed)
Mychart message sent by pt: Amanda Porter "[name]"  P [facility] (supporting Deneise Lever, MD) 3 hours ago (7:49 AM)    We currently have an appt scheduled for 10/2 to go over CT results however we just received them by phone yesterday along with Dr Janee Morn plan. Do we need to keep next weeks appt? We were just seen in office not too long ago?  Also wanted to confirm that the surgery clearance form has been returned to Dr Melvenia Beam office that was sent to you all last week I think.   Thank you!     Dr. Annamaria Boots, please advise.

## 2022-08-29 NOTE — Telephone Encounter (Signed)
Daughter called requesting a refill for medication patient has not been seen since 03/12/22 with no follow up scheduled returned called twice no answer.

## 2022-08-29 NOTE — Telephone Encounter (Signed)
Ok to reschedule appointment to see me in a couple of months. Dr Cari Caraway had sent me a message and I responded after we got the CT report, giving ok from a pulmonary standpoint to go ahead with surgery.

## 2022-09-01 ENCOUNTER — Ambulatory Visit: Payer: Medicare PPO | Admitting: Physician Assistant

## 2022-09-01 ENCOUNTER — Ambulatory Visit: Payer: Medicare PPO | Admitting: Internal Medicine

## 2022-09-01 NOTE — Telephone Encounter (Signed)
OV notes and clearance form have been faxed back to [facility] health neuro. Nothing further needed at this time.

## 2022-09-05 ENCOUNTER — Ambulatory Visit
Admission: RE | Admit: 2022-09-05 | Discharge: 2022-09-05 | Disposition: A | Discharge status: Home / Self Care | Payer: Medicare PPO | Source: Home / Self Care | Attending: Neurosurgery | Admitting: Neurosurgery

## 2022-09-05 DIAGNOSIS — Z982 Presence of cerebrospinal fluid drainage device: Secondary | ICD-10-CM | POA: Insufficient documentation

## 2022-09-05 DIAGNOSIS — Z4541 Encounter for adjustment and management of cerebrospinal fluid drainage device: Secondary | ICD-10-CM | POA: Insufficient documentation

## 2022-09-05 DIAGNOSIS — J449 Chronic obstructive pulmonary disease, unspecified: Secondary | ICD-10-CM | POA: Insufficient documentation

## 2022-09-05 DIAGNOSIS — S72011A Unspecified intracapsular fracture of right femur, initial encounter for closed fracture: Secondary | ICD-10-CM | POA: Insufficient documentation

## 2022-09-05 DIAGNOSIS — E1165 Type 2 diabetes mellitus with hyperglycemia: Secondary | ICD-10-CM

## 2022-09-05 DIAGNOSIS — W19XXXA Unspecified fall, initial encounter: Secondary | ICD-10-CM | POA: Insufficient documentation

## 2022-09-05 DIAGNOSIS — Z01818 Encounter for other preprocedural examination: Secondary | ICD-10-CM

## 2022-09-05 MED ORDER — VANCOMYCIN HCL IN DEXTROSE 1-5 GM/200ML-% IV SOLN: 1000.0000 mg | Freq: Once | INTRAVENOUS | Status: DC

## 2022-09-05 MED ORDER — CHLORHEXIDINE GLUCONATE 0.12 % MT SOLN: 15.0000 mL | Freq: Once | OROMUCOSAL | Status: DC

## 2022-09-05 MED ORDER — SODIUM CHLORIDE 0.9 % IV SOLN: INTRAVENOUS | Status: DC

## 2022-09-05 MED ORDER — ORAL CARE MOUTH RINSE: 15.0000 mL | Freq: Once | OROMUCOSAL | Status: DC

## 2022-09-05 MED ORDER — CEFAZOLIN SODIUM-DEXTROSE 2-4 GM/100ML-% IV SOLN: 2.0000 g | Freq: Once | INTRAVENOUS | Status: DC

## 2022-09-05 NOTE — Progress Notes (Signed)
Per Dr. [name], patient surgery was cancelled for today due to patient "fall" yesterday. Surgery is re-scheduled. To the end of the month.

## 2022-09-06 ENCOUNTER — Inpatient Hospital Stay
Admission: EM | Admit: 2022-09-06 | Discharge: 2022-09-12 | DRG: 522 | Disposition: A | Discharge status: Skilled Nursing Facility | Payer: Medicare PPO | Attending: Family Medicine | Admitting: Family Medicine

## 2022-09-06 ENCOUNTER — Emergency Department: Payer: Medicare PPO

## 2022-09-06 ENCOUNTER — Other Ambulatory Visit: Payer: Self-pay

## 2022-09-06 ENCOUNTER — Encounter: Payer: Self-pay | Admitting: Emergency Medicine

## 2022-09-06 ENCOUNTER — Ambulatory Visit: Payer: Medicare PPO

## 2022-09-06 ENCOUNTER — Encounter: Payer: Self-pay | Admitting: Neurosurgery

## 2022-09-06 DIAGNOSIS — R4701 Aphasia: Secondary | ICD-10-CM | POA: Diagnosis not present

## 2022-09-06 DIAGNOSIS — M6281 Muscle weakness (generalized): Secondary | ICD-10-CM | POA: Diagnosis not present

## 2022-09-06 DIAGNOSIS — S72001A Fracture of unspecified part of neck of right femur, initial encounter for closed fracture: Secondary | ICD-10-CM | POA: Diagnosis not present

## 2022-09-06 DIAGNOSIS — F419 Anxiety disorder, unspecified: Secondary | ICD-10-CM | POA: Diagnosis present

## 2022-09-06 DIAGNOSIS — E785 Hyperlipidemia, unspecified: Secondary | ICD-10-CM | POA: Diagnosis not present

## 2022-09-06 DIAGNOSIS — R4182 Altered mental status, unspecified: Secondary | ICD-10-CM | POA: Diagnosis not present

## 2022-09-06 DIAGNOSIS — Z681 Body mass index (BMI) 19 or less, adult: Secondary | ICD-10-CM | POA: Diagnosis not present

## 2022-09-06 DIAGNOSIS — S7292XA Unspecified fracture of left femur, initial encounter for closed fracture: Secondary | ICD-10-CM | POA: Diagnosis not present

## 2022-09-06 DIAGNOSIS — R2681 Unsteadiness on feet: Secondary | ICD-10-CM | POA: Diagnosis not present

## 2022-09-06 DIAGNOSIS — E876 Hypokalemia: Secondary | ICD-10-CM | POA: Diagnosis present

## 2022-09-06 DIAGNOSIS — J439 Emphysema, unspecified: Secondary | ICD-10-CM | POA: Diagnosis present

## 2022-09-06 DIAGNOSIS — R296 Repeated falls: Secondary | ICD-10-CM | POA: Diagnosis present

## 2022-09-06 DIAGNOSIS — Z471 Aftercare following joint replacement surgery: Secondary | ICD-10-CM | POA: Diagnosis not present

## 2022-09-06 DIAGNOSIS — S72011A Unspecified intracapsular fracture of right femur, initial encounter for closed fracture: Principal | ICD-10-CM | POA: Diagnosis present

## 2022-09-06 DIAGNOSIS — Z8673 Personal history of transient ischemic attack (TIA), and cerebral infarction without residual deficits: Secondary | ICD-10-CM | POA: Diagnosis not present

## 2022-09-06 DIAGNOSIS — Z79890 Hormone replacement therapy (postmenopausal): Secondary | ICD-10-CM

## 2022-09-06 DIAGNOSIS — R918 Other nonspecific abnormal finding of lung field: Secondary | ICD-10-CM | POA: Diagnosis present

## 2022-09-06 DIAGNOSIS — S199XXA Unspecified injury of neck, initial encounter: Secondary | ICD-10-CM | POA: Diagnosis not present

## 2022-09-06 DIAGNOSIS — Z9889 Other specified postprocedural states: Secondary | ICD-10-CM | POA: Diagnosis not present

## 2022-09-06 DIAGNOSIS — F0390 Unspecified dementia without behavioral disturbance: Secondary | ICD-10-CM | POA: Diagnosis present

## 2022-09-06 DIAGNOSIS — R413 Other amnesia: Secondary | ICD-10-CM | POA: Diagnosis present

## 2022-09-06 DIAGNOSIS — Z96641 Presence of right artificial hip joint: Secondary | ICD-10-CM | POA: Diagnosis not present

## 2022-09-06 DIAGNOSIS — K8020 Calculus of gallbladder without cholecystitis without obstruction: Secondary | ICD-10-CM | POA: Diagnosis present

## 2022-09-06 DIAGNOSIS — R41841 Cognitive communication deficit: Secondary | ICD-10-CM | POA: Diagnosis not present

## 2022-09-06 DIAGNOSIS — Z888 Allergy status to other drugs, medicaments and biological substances status: Secondary | ICD-10-CM

## 2022-09-06 DIAGNOSIS — Z66 Do not resuscitate: Secondary | ICD-10-CM | POA: Diagnosis present

## 2022-09-06 DIAGNOSIS — Z743 Need for continuous supervision: Secondary | ICD-10-CM | POA: Diagnosis not present

## 2022-09-06 DIAGNOSIS — E1165 Type 2 diabetes mellitus with hyperglycemia: Secondary | ICD-10-CM | POA: Diagnosis not present

## 2022-09-06 DIAGNOSIS — W19XXXA Unspecified fall, initial encounter: Secondary | ICD-10-CM | POA: Diagnosis not present

## 2022-09-06 DIAGNOSIS — I10 Essential (primary) hypertension: Secondary | ICD-10-CM | POA: Diagnosis present

## 2022-09-06 DIAGNOSIS — Z794 Long term (current) use of insulin: Secondary | ICD-10-CM

## 2022-09-06 DIAGNOSIS — Z1152 Encounter for screening for COVID-19: Secondary | ICD-10-CM

## 2022-09-06 DIAGNOSIS — S0990XA Unspecified injury of head, initial encounter: Secondary | ICD-10-CM | POA: Diagnosis not present

## 2022-09-06 DIAGNOSIS — F03A4 Unspecified dementia, mild, with anxiety: Secondary | ICD-10-CM | POA: Diagnosis not present

## 2022-09-06 DIAGNOSIS — Z982 Presence of cerebrospinal fluid drainage device: Secondary | ICD-10-CM | POA: Diagnosis not present

## 2022-09-06 DIAGNOSIS — Z7984 Long term (current) use of oral hypoglycemic drugs: Secondary | ICD-10-CM

## 2022-09-06 DIAGNOSIS — Z87891 Personal history of nicotine dependence: Secondary | ICD-10-CM

## 2022-09-06 DIAGNOSIS — G912 (Idiopathic) normal pressure hydrocephalus: Secondary | ICD-10-CM | POA: Diagnosis present

## 2022-09-06 DIAGNOSIS — R339 Retention of urine, unspecified: Secondary | ICD-10-CM | POA: Diagnosis not present

## 2022-09-06 DIAGNOSIS — F32A Depression, unspecified: Secondary | ICD-10-CM | POA: Diagnosis present

## 2022-09-06 DIAGNOSIS — R64 Cachexia: Secondary | ICD-10-CM | POA: Diagnosis present

## 2022-09-06 DIAGNOSIS — R109 Unspecified abdominal pain: Secondary | ICD-10-CM | POA: Diagnosis not present

## 2022-09-06 DIAGNOSIS — J9611 Chronic respiratory failure with hypoxia: Secondary | ICD-10-CM | POA: Diagnosis not present

## 2022-09-06 DIAGNOSIS — R2689 Other abnormalities of gait and mobility: Secondary | ICD-10-CM | POA: Diagnosis not present

## 2022-09-06 DIAGNOSIS — I700 Atherosclerosis of aorta: Secondary | ICD-10-CM | POA: Diagnosis present

## 2022-09-06 DIAGNOSIS — R4189 Other symptoms and signs involving cognitive functions and awareness: Secondary | ICD-10-CM | POA: Diagnosis not present

## 2022-09-06 DIAGNOSIS — Z85118 Personal history of other malignant neoplasm of bronchus and lung: Secondary | ICD-10-CM

## 2022-09-06 DIAGNOSIS — Z833 Family history of diabetes mellitus: Secondary | ICD-10-CM

## 2022-09-06 DIAGNOSIS — Y838 Other surgical procedures as the cause of abnormal reaction of the patient, or of later complication, without mention of misadventure at the time of the procedure: Secondary | ICD-10-CM | POA: Diagnosis present

## 2022-09-06 DIAGNOSIS — F03A0 Unspecified dementia, mild, without behavioral disturbance, psychotic disturbance, mood disturbance, and anxiety: Secondary | ICD-10-CM | POA: Diagnosis not present

## 2022-09-06 DIAGNOSIS — Z86711 Personal history of pulmonary embolism: Secondary | ICD-10-CM

## 2022-09-06 DIAGNOSIS — R0602 Shortness of breath: Secondary | ICD-10-CM | POA: Diagnosis not present

## 2022-09-06 DIAGNOSIS — Z4789 Encounter for other orthopedic aftercare: Secondary | ICD-10-CM | POA: Diagnosis not present

## 2022-09-06 DIAGNOSIS — S7291XA Unspecified fracture of right femur, initial encounter for closed fracture: Secondary | ICD-10-CM | POA: Diagnosis present

## 2022-09-06 DIAGNOSIS — R0902 Hypoxemia: Secondary | ICD-10-CM | POA: Diagnosis not present

## 2022-09-06 DIAGNOSIS — Z902 Acquired absence of lung [part of]: Secondary | ICD-10-CM

## 2022-09-06 DIAGNOSIS — T8501XA Breakdown (mechanical) of ventricular intracranial (communicating) shunt, initial encounter: Secondary | ICD-10-CM | POA: Diagnosis present

## 2022-09-06 DIAGNOSIS — S7291XD Unspecified fracture of right femur, subsequent encounter for closed fracture with routine healing: Secondary | ICD-10-CM | POA: Diagnosis not present

## 2022-09-06 DIAGNOSIS — K219 Gastro-esophageal reflux disease without esophagitis: Secondary | ICD-10-CM | POA: Diagnosis present

## 2022-09-06 DIAGNOSIS — I6381 Other cerebral infarction due to occlusion or stenosis of small artery: Secondary | ICD-10-CM | POA: Diagnosis not present

## 2022-09-06 DIAGNOSIS — Z79899 Other long term (current) drug therapy: Secondary | ICD-10-CM

## 2022-09-06 DIAGNOSIS — R1310 Dysphagia, unspecified: Secondary | ICD-10-CM

## 2022-09-06 DIAGNOSIS — J449 Chronic obstructive pulmonary disease, unspecified: Secondary | ICD-10-CM | POA: Diagnosis not present

## 2022-09-06 DIAGNOSIS — R079 Chest pain, unspecified: Secondary | ICD-10-CM | POA: Diagnosis not present

## 2022-09-06 DIAGNOSIS — R278 Other lack of coordination: Secondary | ICD-10-CM | POA: Diagnosis not present

## 2022-09-06 DIAGNOSIS — S72041A Displaced fracture of base of neck of right femur, initial encounter for closed fracture: Secondary | ICD-10-CM | POA: Diagnosis not present

## 2022-09-06 DIAGNOSIS — Z9981 Dependence on supplemental oxygen: Secondary | ICD-10-CM

## 2022-09-06 DIAGNOSIS — Z883 Allergy status to other anti-infective agents status: Secondary | ICD-10-CM

## 2022-09-06 LAB — URINALYSIS, ROUTINE W REFLEX MICROSCOPIC
Bacteria, UA: NONE SEEN
Bilirubin Urine: NEGATIVE
Glucose, UA: NEGATIVE mg/dL
Hgb urine dipstick: NEGATIVE
Ketones, ur: NEGATIVE mg/dL
Leukocytes,Ua: NEGATIVE
Nitrite: NEGATIVE
Protein, ur: 100 mg/dL — AB
Specific Gravity, Urine: >1.046 — ABNORMAL HIGH (ref 1.005–1.030)
Squamous Epithelial / HPF: NONE SEEN (ref 0–5)
pH: 5 (ref 5.0–8.0)

## 2022-09-06 LAB — COMPREHENSIVE METABOLIC PANEL
ALT: 13 U/L (ref 0–44)
AST: 15 U/L (ref 15–41)
Albumin: 3.5 g/dL (ref 3.5–5.0)
Alkaline Phosphatase: 60 U/L (ref 38–126)
Anion gap: 6 (ref 5–15)
BUN: 23 mg/dL (ref 8–23)
CO2: 32 mmol/L (ref 22–32)
Calcium: 9 mg/dL (ref 8.9–10.3)
Chloride: 103 mmol/L (ref 98–111)
Creatinine, Ser: 0.65 mg/dL (ref 0.44–1.00)
GFR, Estimated: >60 mL/min (ref >60)
Glucose, Bld: 169 mg/dL — ABNORMAL HIGH (ref 70–99)
Potassium: 2.8 mmol/L — ABNORMAL LOW (ref 3.5–5.1)
Sodium: 141 mmol/L (ref 135–145)
Total Bilirubin: 0.6 mg/dL (ref 0.3–1.2)
Total Protein: 7.3 g/dL (ref 6.5–8.1)

## 2022-09-06 LAB — SARS CORONAVIRUS 2 BY RT PCR: SARS Coronavirus 2 by RT PCR: NEGATIVE

## 2022-09-06 LAB — CBC
HCT: 31.5 % — ABNORMAL LOW (ref 36.0–46.0)
Hemoglobin: 9.5 g/dL — ABNORMAL LOW (ref 12.0–15.0)
MCH: 27.5 pg (ref 26.0–34.0)
MCHC: 30.2 g/dL (ref 30.0–36.0)
MCV: 91.3 fL (ref 80.0–100.0)
Platelets: 298 10*3/uL (ref 150–400)
RBC: 3.45 MIL/uL — ABNORMAL LOW (ref 3.87–5.11)
RDW: 15.2 % (ref 11.5–15.5)
WBC: 16.1 10*3/uL — ABNORMAL HIGH (ref 4.0–10.5)
nRBC: 0 % (ref 0.0–0.2)

## 2022-09-06 LAB — TYPE AND SCREEN
ABO/RH(D): B POS
Antibody Screen: NEGATIVE

## 2022-09-06 LAB — GLUCOSE, CAPILLARY
Glucose-Capillary: 102 mg/dL — ABNORMAL HIGH (ref 70–99)
Glucose-Capillary: 116 mg/dL — ABNORMAL HIGH (ref 70–99)

## 2022-09-06 LAB — MAGNESIUM: Magnesium: 2 mg/dL (ref 1.7–2.4)

## 2022-09-06 LAB — TROPONIN I (HIGH SENSITIVITY)
Troponin I (High Sensitivity): 17 ng/L (ref <18)
Troponin I (High Sensitivity): 13 ng/L (ref <18)

## 2022-09-06 LAB — LIPASE, BLOOD: Lipase: 25 U/L (ref 11–51)

## 2022-09-06 LAB — BRAIN NATRIURETIC PEPTIDE: B Natriuretic Peptide: 49.1 pg/mL (ref 0.0–100.0)

## 2022-09-06 MED ORDER — ARFORMOTEROL TARTRATE 15 MCG/2ML IN NEBU
15.0000 ug | INHALATION_SOLUTION | Freq: Two times a day (BID) | RESPIRATORY_TRACT | Status: DC
  Administered 2022-09-07 – 2022-09-12 (×9): 15 ug via RESPIRATORY_TRACT
  Filled 2022-09-06 (×13): qty 2

## 2022-09-06 MED ORDER — MORPHINE SULFATE (PF) 2 MG/ML IV SOLN: 0.5000 mg | INTRAVENOUS | Status: DC | PRN

## 2022-09-06 MED ORDER — FENTANYL CITRATE PF 50 MCG/ML IJ SOSY: 25.0000 ug | PREFILLED_SYRINGE | INTRAMUSCULAR | Status: DC | PRN

## 2022-09-06 MED ORDER — INSULIN ASPART 100 UNIT/ML IJ SOLN
0.0000 [IU] | Freq: Three times a day (TID) | INTRAMUSCULAR | Status: DC
  Administered 2022-09-08 (×2): 3 [IU] via SUBCUTANEOUS
  Administered 2022-09-08: 2 [IU] via SUBCUTANEOUS
  Administered 2022-09-09 – 2022-09-10 (×5): 1 [IU] via SUBCUTANEOUS
  Administered 2022-09-10: 3 [IU] via SUBCUTANEOUS
  Administered 2022-09-11: 2 [IU] via SUBCUTANEOUS
  Administered 2022-09-11: 1 [IU] via SUBCUTANEOUS
  Administered 2022-09-11 – 2022-09-12 (×2): 3 [IU] via SUBCUTANEOUS
  Administered 2022-09-12: 1 [IU] via SUBCUTANEOUS
  Filled 2022-09-06 (×14): qty 1

## 2022-09-06 MED ORDER — BUSPIRONE HCL 10 MG PO TABS
45.0000 mg | ORAL_TABLET | Freq: Every morning | ORAL | Status: DC
  Administered 2022-09-07 – 2022-09-12 (×6): 45 mg via ORAL
  Filled 2022-09-06 (×6): qty 5

## 2022-09-06 MED ORDER — MORPHINE SULFATE (PF) 2 MG/ML IV SOLN: 1.0000 mg | INTRAVENOUS | Status: DC | PRN

## 2022-09-06 MED ORDER — HYDROCODONE-ACETAMINOPHEN 5-325 MG PO TABS
1.0000 | ORAL_TABLET | Freq: Four times a day (QID) | ORAL | Status: DC | PRN
  Administered 2022-09-06: 2 via ORAL
  Filled 2022-09-06: qty 2

## 2022-09-06 MED ORDER — POTASSIUM CHLORIDE CRYS ER 20 MEQ PO TBCR
40.0000 meq | EXTENDED_RELEASE_TABLET | Freq: Once | ORAL | Status: AC
  Administered 2022-09-06: 40 meq via ORAL
  Filled 2022-09-06: qty 2

## 2022-09-06 MED ORDER — MEMANTINE HCL 5 MG PO TABS
5.0000 mg | ORAL_TABLET | Freq: Two times a day (BID) | ORAL | Status: DC
  Administered 2022-09-06 – 2022-09-12 (×11): 5 mg via ORAL
  Filled 2022-09-06 (×11): qty 1

## 2022-09-06 MED ORDER — HEPARIN SODIUM (PORCINE) 5000 UNIT/ML IJ SOLN
5000.0000 [IU] | Freq: Three times a day (TID) | INTRAMUSCULAR | Status: AC
  Administered 2022-09-06: 5000 [IU] via SUBCUTANEOUS
  Filled 2022-09-06: qty 1

## 2022-09-06 MED ORDER — SENNOSIDES-DOCUSATE SODIUM 8.6-50 MG PO TABS: 1.0000 | ORAL_TABLET | Freq: Every evening | ORAL | Status: DC | PRN

## 2022-09-06 MED ORDER — HYDRALAZINE HCL 10 MG PO TABS: 10.0000 mg | ORAL_TABLET | Freq: Three times a day (TID) | ORAL | Status: AC | PRN

## 2022-09-06 MED ORDER — IOHEXOL 350 MG/ML SOLN
100.0000 mL | Freq: Once | INTRAVENOUS | Status: AC | PRN
  Administered 2022-09-06: 75 mL via INTRAVENOUS

## 2022-09-06 MED ORDER — ROSUVASTATIN CALCIUM 10 MG PO TABS
20.0000 mg | ORAL_TABLET | Freq: Every day | ORAL | Status: DC
  Administered 2022-09-06 – 2022-09-11 (×6): 20 mg via ORAL
  Filled 2022-09-06: qty 1
  Filled 2022-09-06 (×5): qty 2

## 2022-09-06 MED ORDER — CLONAZEPAM 0.5 MG PO TABS
0.5000 mg | ORAL_TABLET | Freq: Every day | ORAL | Status: DC
  Administered 2022-09-06 – 2022-09-11 (×6): 0.5 mg via ORAL
  Filled 2022-09-06 (×6): qty 1

## 2022-09-06 MED ORDER — BUSPIRONE HCL 10 MG PO TABS
30.0000 mg | ORAL_TABLET | Freq: Every day | ORAL | Status: DC
  Administered 2022-09-06 – 2022-09-11 (×6): 30 mg via ORAL
  Filled 2022-09-06 (×7): qty 3

## 2022-09-06 MED ORDER — POTASSIUM CHLORIDE 10 MEQ/100ML IV SOLN
10.0000 meq | INTRAVENOUS | Status: AC
  Administered 2022-09-06 (×2): 10 meq via INTRAVENOUS
  Filled 2022-09-06: qty 100

## 2022-09-06 MED ORDER — ALBUTEROL SULFATE (2.5 MG/3ML) 0.083% IN NEBU
2.5000 mg | INHALATION_SOLUTION | Freq: Four times a day (QID) | RESPIRATORY_TRACT | Status: DC | PRN
  Administered 2022-09-06 – 2022-09-09 (×4): 2.5 mg via RESPIRATORY_TRACT
  Filled 2022-09-06 (×4): qty 3

## 2022-09-06 MED ORDER — UMECLIDINIUM BROMIDE 62.5 MCG/ACT IN AEPB
1.0000 | INHALATION_SPRAY | Freq: Every day | RESPIRATORY_TRACT | Status: DC
  Administered 2022-09-07 – 2022-09-12 (×6): 1 via RESPIRATORY_TRACT
  Filled 2022-09-06: qty 7

## 2022-09-06 MED ORDER — BISACODYL 5 MG PO TBEC
5.0000 mg | DELAYED_RELEASE_TABLET | Freq: Every day | ORAL | Status: DC | PRN
  Administered 2022-09-10 – 2022-09-12 (×2): 5 mg via ORAL
  Filled 2022-09-06 (×3): qty 1

## 2022-09-06 MED ORDER — INSULIN ASPART 100 UNIT/ML IJ SOLN: 0.0000 [IU] | Freq: Every day | INTRAMUSCULAR | Status: DC

## 2022-09-06 MED ORDER — BUPROPION HCL ER (XL) 150 MG PO TB24
300.0000 mg | ORAL_TABLET | Freq: Every day | ORAL | Status: DC
  Administered 2022-09-08 – 2022-09-12 (×5): 300 mg via ORAL
  Filled 2022-09-06 (×5): qty 2

## 2022-09-06 MED ORDER — FOLIC ACID 1 MG PO TABS
1.0000 mg | ORAL_TABLET | Freq: Every day | ORAL | Status: DC
  Administered 2022-09-08 – 2022-09-12 (×5): 1 mg via ORAL
  Filled 2022-09-06 (×5): qty 1

## 2022-09-06 MED ORDER — AMLODIPINE BESYLATE 5 MG PO TABS
2.5000 mg | ORAL_TABLET | Freq: Every day | ORAL | Status: DC
  Administered 2022-09-06 – 2022-09-11 (×6): 2.5 mg via ORAL
  Filled 2022-09-06 (×6): qty 1

## 2022-09-06 MED ORDER — TAMSULOSIN HCL 0.4 MG PO CAPS
0.4000 mg | ORAL_CAPSULE | Freq: Every day | ORAL | Status: DC
  Administered 2022-09-06 – 2022-09-11 (×6): 0.4 mg via ORAL
  Filled 2022-09-06 (×6): qty 1

## 2022-09-06 MED ORDER — POTASSIUM CHLORIDE CRYS ER 20 MEQ PO TBCR
20.0000 meq | EXTENDED_RELEASE_TABLET | Freq: Every day | ORAL | Status: AC
  Administered 2022-09-06: 20 meq via ORAL
  Filled 2022-09-06: qty 1

## 2022-09-06 MED ORDER — FENTANYL CITRATE PF 50 MCG/ML IJ SOSY
PREFILLED_SYRINGE | INTRAMUSCULAR | Status: AC
  Administered 2022-09-06: 25 ug via INTRAVENOUS
  Filled 2022-09-06: qty 1

## 2022-09-06 NOTE — Assessment & Plan Note (Signed)
-   Home glipizide has been held on admission - Insulin SSI with at bedtime coverage ordered - Goal inpatient blood glucose levels 140-180

## 2022-09-06 NOTE — Assessment & Plan Note (Signed)
-   Rosuvastatin 20 mg daily resumed 

## 2022-09-06 NOTE — Assessment & Plan Note (Signed)
-   Presumed secondary to mechanical fall in patient with normal pressure hydrocephalus and a malfunctioning VP shunt - Fall precautions - Orthopedic service has been consulted and plans for OR tomorrow on 09/07/2022 - Pain control: Hydrocodone-acetaminophen 5-3 25, 1 to 2 tablets p.o. every 6 hours as needed for moderate pain; morphine 1 mg IV every 2 hours as needed for severe pain - Admit to MedSurg, inpatient

## 2022-09-06 NOTE — Assessment & Plan Note (Signed)
-   Confirmed with both patient and daughter, Amanda Porter

## 2022-09-06 NOTE — Assessment & Plan Note (Deleted)
-   Presumed secondary to mechanical fall in patient with normal pressure hydrocephalus and a malfunctioning VP shunt - Fall precautions - Orthopedic service has been consulted and plans for OR tomorrow on 09/07/2022 - Pain control: Hydrocodone-acetaminophen 5-3 25, 1 to 2 tablets p.o. every 6 hours as needed for moderate pain; morphine 1 mg IV every 2 hours as needed for severe pain - Admit to MedSurg, inpatient

## 2022-09-06 NOTE — Assessment & Plan Note (Signed)
-   Memantine 5 mg twice daily resumed

## 2022-09-06 NOTE — Hospital Course (Addendum)
Ms. Tenecia Ignasiak is a 77 year old female with medical history of normal pressure hydrocephalus, status post VP shunt placement and shunt malfunction, COPD, chronic respiratory failure, history of CVA, depression, dementia, non-insulin-dependent diabetes mellitus, dysphagia, dyslipidemia, who presented to the emergency department for chief concerns of frequent falls.  Initial vitals in the emergency department showed temperature of 97.5, respiration rate of 18, heart rate of 93, blood pressure 126/59, SPO2 of 91% on 4 L nasal cannula.  Serum sodium is 141, potassium 2.8, chloride 103, bicarb 32, BUN of 23, serum creatinine of 0.65, GFR greater than 60, nonfasting blood glucose 169, WBC 16.1, platelets of 298, hemoglobin 9.5.  COVID PCR was negative.  BNP was within normal limits at 49.1.  Magnesium was 2.0.  Lipase was negative.  X-ray of the right hip: Mildly displaced, varus angulation subcapital right femoral neck fracture.  No dislocation.  Skull x-ray and abdominal x-ray was read as stable right ventriculostomy catheter and intact visualization of a portion of the ventricular peritoneal catheter.  CT head without contrast: Ventricles are slightly larger than on prior CT.  This would support shunt dysfunction.  Moderate to advanced chronic microvascular ischemic changes.  EDP consulted orthopedic provider who states patient will plan to have the OR on 09/07/2022.  ED treatment: Potassium chloride 40 mill colons one-time dose, potassium chloride 10 mill equivalent, fentanyl 25 mcg ordered.

## 2022-09-06 NOTE — Anesthesia Preprocedure Evaluation (Signed)
Anesthesia Evaluation  Patient identified by MRN, date of birth, ID band Patient awake and Patient confused    Reviewed: Allergy & Precautions, NPO status , Patient's Chart, lab work & pertinent test results  History of Anesthesia Complications Negative for: history of anesthetic complications  Airway Mallampati: III  TM Distance: >3 FB Neck ROM: full    Dental  (+) Chipped, Poor Dentition, Missing   Pulmonary COPD (3 L),  oxygen dependent, former smoker,    Pulmonary exam normal        Cardiovascular hypertension, (-) anginaNormal cardiovascular exam     Neuro/Psych PSYCHIATRIC DISORDERS Dementia VP shunt for normal pressure hydrocephalus  CVA    GI/Hepatic Neg liver ROS, GERD  Controlled,  Endo/Other  diabetes, Type 2  Renal/GU      Musculoskeletal   Abdominal   Peds  Hematology negative hematology ROS (+)   Anesthesia Other Findings Past Medical History: 11/05/2021: Acute respiratory failure requiring reintubation (HCC) No date: Anemia No date: Anxiety No date: Anxiety and depression No date: COPD (chronic obstructive pulmonary disease) (HCC) No date: CVA (cerebral vascular accident) ([place]) No date: CVA (cerebral vascular accident) ([place])     Comment:  L sided deficits No date: Dementia ([place]) No date: Depression No date: Diabetes mellitus ([place])     Comment:  Type 2 No date: Diabetes mellitus without complication (HCC) No date: Dysphagia No date: Dysphasia No date: GERD (gastroesophageal reflux disease) No date: History of lung cancer No date: Hyperlipidemia No date: Hypertension 12/13/2021: Influenza A 11/20/2017: Memory disturbance 2018: Normal pressure hydrocephalus ([place]) 08/25/2022: Nose colonized with MRSA     Comment:  a.) noted on pre-surgical swab prior to VP shunt               revision No date: Retinal detachment     Comment:  Right 11/20/2017: Right carotid bruit 02/01/2020: Tardive  dyskinesia No date: Tardive dyskinesia No date: Thrombosis     Comment:  Arterial to lower extremity?  Past Surgical History: 07/25/2022: IR FL GUIDED LOC OF NEEDLE/CATH TIP FOR SPINAL INJECTION  RT No date: LUNG REMOVAL, PARTIAL     Comment:  left upper lobe 12/09/2016: VENTRICULOPERITONEAL SHUNT 10/2018: VENTRICULOPERITONEAL SHUNT  BMI    Body Mass Index: 18.95 kg/m      Reproductive/Obstetrics negative OB ROS                            Anesthesia Physical Anesthesia Plan  ASA: 4  Anesthesia Plan: General ETT   Post-op Pain Management:    Induction: Intravenous  PONV Risk Score and Plan: Ondansetron, Dexamethasone, Midazolam and Treatment may vary due to age or medical condition  Airway Management Planned: Oral ETT  Additional Equipment:   Intra-op Plan:   Post-operative Plan: Extubation in OR and Possible Post-op intubation/ventilation  Informed Consent: I have reviewed the patients History and Physical, chart, labs and discussed the procedure including the risks, benefits and alternatives for the proposed anesthesia with the patient or authorized representative who has indicated his/her understanding and acceptance.   Patient has DNR.  Discussed DNR with power of attorney and Suspend DNR.   Dental Advisory Given  Plan Discussed with: Anesthesiologist, CRNA and Surgeon  Anesthesia Plan Comments: (History and phone consent from the patients daughter Ayianna Darnold at [phone]   Daughter consented for risks of anesthesia including but not limited to:  - adverse reactions to medications - damage to eyes, teeth, lips or other oral  mucosa - nerve damage due to positioning  - sore throat or hoarseness - Damage to heart, brain, nerves, lungs, other parts of body or loss of life  She voiced understanding.)       Anesthesia Quick Evaluation

## 2022-09-06 NOTE — Assessment & Plan Note (Signed)
-   VP shunt is currently malfunctions - Continue follow-up with neuro surgery for revision as appropriate - When patient fell 3 to 4 days ago, she did hit her head at the exact area where they need to go in to perform the revision therefore Dr. Cari Caraway states he will defer the revision until the wound heals completely due to possible risk of infection

## 2022-09-06 NOTE — ED Notes (Signed)
ED Provider at bedside. 

## 2022-09-06 NOTE — Assessment & Plan Note (Addendum)
-   Pain takes amlodipine 2.5 mg p.o. nightly, resumed - Hydralazine 10 mg p.o. every 6 hours as needed for SBP greater than 180, 4 days of

## 2022-09-06 NOTE — ED Provider Notes (Addendum)
[facility] Provider Note    None    (approximate)   History   Fall and Leg Pain   HPI  Amanda Porter is a 76 y.o. female with VP shunt who comes in with concern for a fall.  Patient reports having a fall Thursday  where she hit her head on the same side of the shunt they have opted to not do the surgery for a revision yesterday due to small cut near the site they would need to open and concern for increased risk of infection however this morning she had another fall around 4 AM now with more severe pain in her right leg is also some concern for increasing shortness of breath over the past week patient is on 2 L at baseline for her COPD. Pt has been unable to bear weight since fall this AM on right hip.  Patient does report however they have a history of PE but they had to take her off her blood thinner when she had a prior subdural a few months ago.   Physical Exam   Triage Vital Signs: ED Triage Vitals  Enc Vitals Group     BP 09/06/22 1016 (!) 126/59     Pulse Rate 09/06/22 1016 93     Resp 09/06/22 1016 18     Temp 09/06/22 1016 (!) 97.5 F (36.4 C)     Temp Source 09/06/22 1016 Oral     SpO2 09/06/22 1016 91 %     Weight 09/06/22 1006 103 lb 9.9 oz (47 kg)     Height 09/06/22 1006 5\' 2"  (1.575 m)     Head Circumference --      Peak Flow --      Pain Score 09/06/22 1006 9     Pain Loc --      Pain Edu? --      Excl. in [place]? --     Most recent vital signs: Vitals:   09/06/22 1016  BP: (!) 126/59  Pulse: 93  Resp: 18  Temp: (!) 97.5 F (36.4 C)  SpO2: 91%     General: Awake, no distress.  CV:  Good peripheral perfusion.  Resp:  Normal effort.  Abd:  No distention.  Other:  Patient has pain in her right hip.  Neurovascular intact able to flex and extend the ankle but pain on the right hip No CTL spine tenderness.  Abdomen soft and nontender.  No chest wall tenderness.  Patient has a shunt noted.  Small superficial abrasion  noted   ED Results / Procedures / Treatments   Labs (all labs ordered are listed, but only abnormal results are displayed) Labs Reviewed  BASIC METABOLIC PANEL  CBC  TYPE AND SCREEN  TROPONIN I (HIGH SENSITIVITY)     EKG  My interpretation of EKG:  Normal sinus rhythm 93 without any ST elevation or T wave inversions, normal intervals  RADIOLOGY I have reviewed the xray personally and interpreted and patient has a right femur fracture of the femoral neck   PROCEDURES:  Critical Care performed: No  .1-3 Lead EKG Interpretation  Performed by: Vanessa Windsor, MD Authorized by: Vanessa Parnell, MD     Interpretation: normal     ECG rate:  90   ECG rate assessment: normal     Rhythm: sinus rhythm     Ectopy: none     Conduction: normal      MEDICATIONS ORDERED IN ED: Medications  potassium chloride SA (KLOR-CON M) CR tablet 40 mEq (has no administration in time range)  potassium chloride 10 mEq in 100 mL IVPB (has no administration in time range)  fentaNYL (SUBLIMAZE) injection 25 mcg (has no administration in time range)  iohexol (OMNIPAQUE) 350 MG/ML injection 100 mL (75 mLs Intravenous Contrast Given 09/06/22 1147)     IMPRESSION / MDM / ASSESSMENT AND PLAN / ED COURSE  I reviewed the triage vital signs and the nursing notes.   Patient's presentation is most consistent with acute presentation with potential threat to life or bodily function.   76 year old who comes in with mechanical fall onto the right hip.  Differential includes fracture, dislocation.  Given the concern for some increasing shortness of breath and low oxygen levels over the past few days we will get COVID test as well as CT PE evaluate for pulmonary embolism given prior history of this.  We will also get CT head and CT cervical given the fall.  Her abdomen is soft and nontender no lower back pain to suggest needing CT abdomen-- tdap UTD in 2020  Patient given some IV fentanyl for pain.  X-ray  confirms fracture of femoral neck.  I did send a message to Dr. Harlow Mares.  COVID test was negative.  White count was elevated but no other infectious symptoms.  Troponin negative.  Potassium was low at 2.8.  Patient will be getting some IV and oral repletion.  Magnesium was normal.  CT head was negative.  Will discuss hospital team for admission  IMPRESSION: No evidence of pulmonary embolism.  Stable 12 mm solid nodule in the anterior apical region of the left lung. Consider continued follow-up by chest CT in 6 months, or further characterization with PET-CT.  Cholelithiasis incidentally noted.  Aortic Atherosclerosis (ICD10-I70.0) and Emphysema (ICD10-J43.9).   Discussed with fam so they aware of follup.   The patient is on the cardiac monitor to evaluate for evidence of arrhythmia and/or significant heart rate changes.      FINAL CLINICAL IMPRESSION(S) / ED DIAGNOSES   Final diagnoses:  Fall, initial encounter  Closed displaced fracture of right femoral neck ([place])  Hypokalemia     Rx / DC Orders   ED Discharge Orders     None        Note:  This document was prepared using Dragon voice recognition software and may include unintentional dictation errors.   Vanessa Robinson, MD 09/06/22 1223    Vanessa Mason, MD 09/06/22 [phone]

## 2022-09-06 NOTE — ED Triage Notes (Addendum)
Pt daughter reports pt was scheduled for a ventriculoperitoneal shunt revision yesterday but the surgery was cancelled because she fell and hit her head on the same site as the shunt. Pt daughter reports she also hurt her right leg and then fell again this am around 4am and now has more severe pain in her right leg around her hip. Pt daughter reports she is concerned that she broke something. Pt daughter also reports pt has been increasingly SOB over the past week. Pt is on 2L of O2 at all times but gets winded and her sats drop when she walks. No fever, cough or pain. Daughter reports pt did have a CT scan to be cleared for surgery as well.

## 2022-09-06 NOTE — Assessment & Plan Note (Signed)
-   Rosuvastatin 20 mg nightly resumed

## 2022-09-06 NOTE — Assessment & Plan Note (Addendum)
-   Status post potassium chloride 40 mill equivalent one-time dose and potassium chloride 10 mill equivalent IV, 2 doses ordered - Magnesium is 2.0 on admission - Ordered additional potassium chloride 20 mill equivalent nightly, one-time dose ordered - BMP in the a.m.

## 2022-09-06 NOTE — Assessment & Plan Note (Signed)
-   Resume home bupropion 300 mg daily with breakfast, buspirone 45 milligrams every morning and 30 mg nightly resumed

## 2022-09-06 NOTE — Progress Notes (Signed)
38 Pt stating she can not breathe  breathing treatment started. Pt states she feels better now. Ax3 denies chest pain

## 2022-09-06 NOTE — Assessment & Plan Note (Signed)
-   Memantine 5 mg p.o. twice daily resumed

## 2022-09-06 NOTE — Plan of Care (Signed)
  Problem: Education: Goal: Ability to describe self-care measures that may prevent or decrease complications (Diabetes Survival Skills Education) will improve Outcome: Progressing   Problem: Skin Integrity: Goal: Risk for impaired skin integrity will decrease Outcome: Progressing   Problem: Education: Goal: Knowledge of General Education information will improve Description: Including pain rating scale, medication(s)/side effects and non-pharmacologic comfort measures Outcome: Progressing

## 2022-09-06 NOTE — H&P (Addendum)
History and Physical   Amanda Porter SNK:[id number] DOB: May 14, 1946 DOA: 09/06/2022  PCP: Pleas Koch, NP  Outpatient Specialists: Dr. Cari Caraway, neurosurgery Patient coming from: Home  I have personally briefly reviewed patient's old medical records in [place].  Chief Concern: Increased fall, inability to ambulate and right hip pain  HPI: [name] is a 76 year old female with medical history of normal pressure hydrocephalus, status post VP shunt placement and shunt malfunction, COPD, chronic respiratory failure, history of CVA, depression, dementia, non-insulin-dependent diabetes mellitus, dysphagia, dyslipidemia, who presented to the emergency department for chief concerns of frequent falls.  Initial vitals in the emergency department showed temperature of 97.5, respiration rate of 18, heart rate of 93, blood pressure 126/59, SPO2 of 91% on 4 L nasal cannula.  Serum sodium is 141, potassium 2.8, chloride 103, bicarb 32, BUN of 23, serum creatinine of 0.65, GFR greater than 60, nonfasting blood glucose 169, WBC 16.1, platelets of 298, hemoglobin 9.5.  COVID PCR was negative.  BNP was within normal limits at 49.1.  Magnesium was 2.0.  Lipase was negative.  X-ray of the right hip: Mildly displaced, varus angulation subcapital right femoral neck fracture.  No dislocation.  Skull x-ray and abdominal x-ray was read as stable right ventriculostomy catheter and intact visualization of a portion of the ventricular peritoneal catheter.  CT head without contrast: Ventricles are slightly larger than on prior CT.  This would support shunt dysfunction.  Moderate to advanced chronic microvascular ischemic changes.  EDP consulted orthopedic provider who states patient will plan to have the OR on 09/07/2022.  ED treatment: Potassium chloride 40 mill colons one-time dose, potassium chloride 10 mill equivalent, fentanyl 25 mcg  ordered. ------------------------------------------------------------------ At bedside patient was able to tell me her name, her age, the current calendar year of 63.  She initially states that she was at home in her bedroom however upon asking her again she endorsed that she is in the hospital because she fell.  She reports that she fell about 2 times in the last 3 days.  The first time she fell she hit her head, with superficial skin laceration at the site of VP shunt.  She was supposed to have her VP shunt revision on 09/05/2022 however due to the skin laceration, neurosurgery has canceled this procedure and rescheduled for 09/19/2022.  Patient then fell yesterday when trying to go to the restroom.  At bedside she does not appear to be in acute distress, denies chest pain, abdominal pain, dysuria, hematuria, diarrhea, swelling of her lower extremities, fever, chills, nausea, vomiting.  She endorses chronic shortness of breath.  She states that she always wears oxygen, 3 L nasal cannula due to history of COPD.  Social history: She lives with daughter. She is a former tobacco user, quitting 9 to 10 years ago.  She denies EtOH, recreational drug use.  ROS: Constitutional: no weight change, no fever ENT/Mouth: no sore throat, no rhinorrhea Eyes: no eye pain, no vision changes Cardiovascular: no chest pain, + dyspnea,  no edema, no palpitations Respiratory: no cough, no sputum, no wheezing Gastrointestinal: no nausea, no vomiting, no diarrhea, no constipation Genitourinary: no urinary incontinence, no dysuria, no hematuria Musculoskeletal: no arthralgias, no myalgias Skin: no skin lesions, no pruritus, Neuro: + weakness, no loss of consciousness, no syncope Psych: no anxiety, no depression, no decrease appetite Heme/Lymph: no bruising, no bleeding  ED Course: Discussed with the medicine provider, patient requiring hospitalization for chief concerns of right femoral neck  fracture.  Assessment/Plan  Principal Problem:   Closed displaced fracture of right femoral neck (HCC) Active Problems:   NPH (normal pressure hydrocephalus) (HCC)   Hypokalemia   Dementia without behavioral disturbance (HCC)   S/P VP shunt   Type 2 diabetes mellitus with hyperglycemia (HCC)   Anxiety and depression   History of CVA (cerebrovascular accident)   Hyperlipidemia   Dysphagia   Essential hypertension   Memory disturbance   DNR (do not resuscitate)   Multiple lung nodules   Assessment and Plan:  * Closed displaced fracture of right femoral neck ([place]) - Presumed secondary to mechanical fall in patient with normal pressure hydrocephalus and a malfunctioning VP shunt - Fall precautions - Orthopedic service has been consulted and plans for OR tomorrow on 09/07/2022 - Pain control: Hydrocodone-acetaminophen 5-3 25, 1 to 2 tablets p.o. every 6 hours as needed for moderate pain; morphine 1 mg IV every 2 hours as needed for severe pain - Admit to MedSurg, inpatient  Hypokalemia - Status post potassium chloride 40 mill equivalent one-time dose and potassium chloride 10 mill equivalent IV, 2 doses ordered - Magnesium is 2.0 on admission - Ordered additional potassium chloride 20 mill equivalent nightly, one-time dose ordered - BMP in the a.m.  Dementia without behavioral disturbance (HCC) - Memantine 5 mg twice daily resumed  S/P VP shunt - VP shunt is currently malfunctions - Continue follow-up with neuro surgery for revision as appropriate - When patient fell 3 to 4 days ago, she did hit her head at the exact area where they need to go in to perform the revision therefore Dr. Cari Caraway states he will defer the revision until the wound heals completely due to possible risk of infection  Type 2 diabetes mellitus with hyperglycemia ([place]) - Home glipizide has been held on admission - Insulin SSI with at bedtime coverage ordered - Goal inpatient blood glucose levels  140-180  Anxiety and depression - Resume home bupropion 300 mg daily with breakfast, buspirone 45 milligrams every morning and 30 mg nightly resumed  History of CVA (cerebrovascular accident) - Rosuvastatin 20 mg nightly resumed  DNR (do not resuscitate) - Confirmed with both patient and daughter, Jennah Satchell  Memory disturbance - Memantine 5 mg p.o. twice daily resumed  Essential hypertension - Pain takes amlodipine 2.5 mg p.o. nightly, resumed - Hydralazine 10 mg p.o. every 6 hours as needed for SBP greater than 180, 4 days of  Hyperlipidemia - Rosuvastatin 20 mg daily resumed  DVT prophylaxis-pharmacologic DVT prophylaxis was ordered for 1 dose on admission - AM team to reinitiate pharmacologic DVT prophylaxis when the benefits outweigh the risk  Chart reviewed.   DVT prophylaxis: 5000 units subcutaneous one-time dose ordered Code Status: DNR Diet: Heart healthy/carb modified; n.p.o. after midnight Family Communication: Discussed with and updated daughter, Anthonette Lesage over the phone Disposition Plan: Pending clinical course Consults called: Orthopedic Admission status: MedSurg, inpatient  Past Medical History:  Diagnosis Date   Acute respiratory failure requiring reintubation ([place]) 11/05/2021   Anemia    Anxiety    Anxiety and depression    COPD (chronic obstructive pulmonary disease) ([place])    CVA (cerebral vascular accident) ([place])    CVA (cerebral vascular accident) ([place])    L sided deficits   Dementia ([place])    Depression    Diabetes mellitus ([place])    Type 2   Diabetes mellitus without complication ([place])    Dysphagia    Dysphasia    GERD (gastroesophageal reflux disease)  History of lung cancer    Hyperlipidemia    Hypertension    Influenza A 12/13/2021   Memory disturbance 11/20/2017   Normal pressure hydrocephalus ([place]) 2018   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   Past Surgical History:  Procedure Laterality Date   IR FL GUIDED LOC OF NEEDLE/CATH TIP FOR SPINAL INJECTION RT  07/25/2022   LUNG REMOVAL, PARTIAL     left upper lobe   VENTRICULOPERITONEAL SHUNT  12/09/2016   VENTRICULOPERITONEAL SHUNT  10/2018   Social History:  reports that she quit smoking about 5 years ago. Her smoking use included cigarettes. She has a 25.00 pack-year smoking history. She has never used smokeless tobacco. She reports that she does not currently use alcohol. She reports that she does not currently use drugs.  Allergies  Allergen Reactions   Lexapro [Escitalopram Oxalate] Other (See Comments)    Dizziness, nausea, fatigue   Neosporin  [Neomycin-Polymyxin-Gramicidin] Rash    (Neosporin)   Neosporin [Bacitracin-Polymyxin B] Rash   Family History  Problem Relation Age of Onset   Pneumonia Mother    Cancer Father    Diabetes Sister    Diabetes Sister    Breast cancer Neg Hx    Family history: Family history reviewed and not pertinent  Prior to Admission medications   Medication Sig Start Date End Date Taking? Authorizing Provider  Accu-Chek Softclix Lancets lancets Use as instructed 04/04/22   Eugenia Pancoast, FNP  acetaminophen (TYLENOL) 500 MG tablet Take 1,000 mg by mouth daily as needed.    [provider]  albuterol (PROVENTIL) (2.5 MG/3ML) 0.083% nebulizer solution USE 1 VIAL IN NEBULIZER EVERY 6 HOURS Patient taking differently: Take 2.5 mg by nebulization every 6 (six) hours as needed for wheezing or shortness of breath. 07/20/21   Baird Lyons D, MD  albuterol (VENTOLIN HFA) 108 (90 Base) MCG/ACT inhaler INHALE 2 PUFFS BY MOUTH EVERY 4 HOURS AS NEEDED 07/11/22   Baird Lyons D, MD  amLODipine (NORVASC) 2.5 MG tablet Take 1 tablet (2.5 mg total) by mouth daily. for blood pressure. Patient taking  differently: Take 2.5 mg by mouth at bedtime. for blood pressure. 08/19/22   Pleas Koch, NP  buPROPion (WELLBUTRIN XL) 300 MG 24 hr tablet Take 1 tablet (300 mg total) by mouth daily with breakfast. 08/02/22   Nolberto Hanlon, MD  busPIRone (BUSPAR) 15 MG tablet Take 45 mg by mouth in the morning.    [provider]  busPIRone (BUSPAR) 30 MG tablet Take 1 tablet (30 mg total) by mouth at bedtime. 08/02/22   Nolberto Hanlon, MD  clonazePAM (KLONOPIN) 1 MG tablet Take 1 mg by mouth at bedtime.    [provider]  conjugated estrogens (PREMARIN) vaginal cream Apply one pea-sized amount around the opening of the urethra daily for 2 weeks, then 3 times weekly moving forward. 08/20/22   Vaillancourt, Aldona Bar, PA-C  Cyanocobalamin (B-12 PO) Take 1 capsule by mouth daily.    [provider]  folic acid (FOLVITE) 1 MG tablet Take 1 tablet (1 mg total) by mouth daily. 12/13/21   Pleas Koch, NP  glipiZIDE (GLUCOTROL XL) 2.5 MG 24 hr tablet TAKE 1 TABLET (2.5 MG TOTAL) BY MOUTH DAILY WITH BREAKFAST.  FOR DIABETES. 08/04/22   Pleas Koch, NP  glucose blood test strip Use as instructed 04/04/22   Eugenia Pancoast, FNP  guaiFENesin (MUCINEX) 600 MG 12 hr tablet Take 600 mg by mouth 2 (two) times daily as needed.    [provider]  memantine (NAMENDA) 5 MG tablet Take 1 tablet (5 mg total) by mouth 2 (two) times daily. For memory. 07/14/22   Pleas Koch, NP  miconazole (MONISTAT 7) 2 % vaginal cream Place 1 Applicatorful vaginally at bedtime. 08/20/22   Vaillancourt, Aldona Bar, PA-C  OXYGEN 3 L continuous.     [provider]  potassium chloride (KLOR-CON) 10 MEQ tablet Take 1 tablet (10 mEq total) by mouth daily for 7 days. 08/25/22 09/01/22  Karen Kitchens, NP  rosuvastatin (CRESTOR) 20 MG tablet TAKE 1 TABLET (20 MG TOTAL) BY MOUTH EVERY EVENING. FOR CHOLESTEROL. 07/13/22   Pleas Koch, NP  tamsulosin (FLOMAX) 0.4 MG CAPS capsule Take 1 capsule (0.4 mg  total) by mouth daily. For urine retention Patient taking differently: Take 0.4 mg by mouth daily after supper. For urine retention 12/13/21   Pleas Koch, NP  Tiotropium Bromide Monohydrate (SPIRIVA RESPIMAT) 1.25 MCG/ACT AERS Inhale 2 puffs into the lungs daily. Patient taking differently: Inhale 2 puffs into the lungs daily. If unable to pay for [facility] 07/01/22   Baird Lyons D, MD  Tiotropium Bromide-Olodaterol (STIOLTO RESPIMAT) 2.5-2.5 MCG/ACT AERS INHALE 2 PUFFS BY MOUTH INTO THE LUNGS DAILY Patient taking differently: Inhale 2 puffs into the lungs daily. 12/26/21   Baird Lyons D, MD   Physical Exam: Vitals:   09/06/22 1300 09/06/22 1400 09/06/22 1425 09/06/22 1604  BP: (!) 114/99 (!) 125/48 (!) 125/48 107/69  Pulse: 95 (!) 105 100 (!) 109  Resp: (!) 22 (!) 22 18 19   Temp:   98.8 F (37.1 C)   TempSrc:   Oral   SpO2: 99% 97% 99% 97%  Weight:      Height:       Constitutional: appears age-appropriate, frail, cachectic, NAD, calm, comfortable Eyes: PERRL, lids and conjunctivae normal HENMT: Mucous membranes are moist. Posterior pharynx clear of any exudate or lesions. Age-appropriate dentition. Hearing appropriate.  Small skin laceration approximately 1 to 2 cm at the site of VP shunt, on the right side Neck: normal, supple, no masses, no thyromegaly Respiratory: clear to auscultation bilaterally, no wheezing, no crackles. Normal respiratory effort. No accessory muscle use.  Nasal cannula in place Cardiovascular: Regular rate and rhythm, no murmurs / rubs / gallops. No extremity edema. 2+ pedal pulses. No carotid bruits.  Abdomen: no tenderness, no masses palpated, no hepatosplenomegaly. Bowel sounds positive.  Musculoskeletal: no clubbing / cyanosis. No joint deformity upper and lower extremities. Good ROM of the upper extremity and left lower extremity. no contractures. Normal muscle tone.  Right lower extremity pain and decreased range of motion Skin: no rashes, lesions,  ulcers. No induration Neurologic: Sensation intact. Strength 5/5 in bilateral upper extremity and left lower extremity.  Strength is decreased in the right lower extremity. Psychiatric: Normal judgment and insight. Alert and oriented x 3. Normal mood.   EKG: independently reviewed, showing sinus rhythm with rate of 93, QTc 457  Chest x-ray on Admission: I personally reviewed and I agree with radiologist reading as below.  CT Angio Chest PE W and/or Wo Contrast  Result Date: 09/06/2022 CLINICAL DATA:  Worsening shortness of breath and hypoxia for 1 week. High probability for pulmonary embolism. EXAM: CT ANGIOGRAPHY  CHEST WITH CONTRAST TECHNIQUE: Multidetector CT imaging of the chest was performed using the standard protocol during bolus administration of intravenous contrast. Multiplanar CT image reconstructions and MIPs were obtained to evaluate the vascular anatomy. RADIATION DOSE REDUCTION: This exam was performed according to the departmental dose-optimization program which includes automated exposure control, adjustment of the mA and/or kV according to patient size and/or use of iterative reconstruction technique. CONTRAST:  16mL OMNIPAQUE IOHEXOL 350 MG/ML SOLN COMPARISON:  Noncontrast chest CT on 08/27/2022 from [facility], and [facility]/14/2022 from [facility] hospital FINDINGS: Cardiovascular: Satisfactory opacification of pulmonary arteries noted, and no pulmonary emboli identified. No evidence of thoracic aortic dissection or aneurysm. Aortic and coronary atherosclerotic calcification incidentally noted. Moderate to severe left ventricular hypertrophy also noted. Mediastinum/Nodes: No masses or pathologically enlarged lymph nodes identified. Lungs/Pleura: Moderate centrilobular emphysema again noted. Postop changes are again seen from prior left upper lobe resection. A solid nodule is again seen in the anterior apical region of the left lung which measures 12 x 7 mm on image 36/5.  This remains stable compared to previous studies. Few other tiny less than 5 mm pulmonary nodules in the peripheral left lung also remain stable. Upper abdomen: Cholelithiasis again noted. Musculoskeletal: No suspicious bone lesions identified. Review of the MIP images confirms the above findings. IMPRESSION: No evidence of pulmonary embolism. Stable 12 mm solid nodule in the anterior apical region of the left lung. Consider continued follow-up by chest CT in 6 months, or further characterization with PET-CT. Cholelithiasis incidentally noted. Aortic Atherosclerosis (ICD10-I70.0) and Emphysema (ICD10-J43.9). Electronically Signed   By: Marlaine Hind M.D.   On: 09/06/2022 12:24   CT HEAD WO CONTRAST (5MM)  Result Date: 09/06/2022 CLINICAL DATA:  Four VP shunt revision yesterday. Procedure canceled because patient fell. Patient has a right hip fracture. EXAM: CT HEAD WITHOUT CONTRAST CT CERVICAL SPINE WITHOUT CONTRAST TECHNIQUE: Multidetector CT imaging of the head and cervical spine was performed following the standard protocol without intravenous contrast. Multiplanar CT image reconstructions of the cervical spine were also generated. RADIATION DOSE REDUCTION: This exam was performed according to the departmental dose-optimization program which includes automated exposure control, adjustment of the mA and/or kV according to patient size and/or use of iterative reconstruction technique. COMPARISON:  07/29/2022. FINDINGS: CT HEAD FINDINGS Brain: No evidence of acute infarction, hemorrhage, extra-axial collection or mass lesion/mass effect. There is ventricular enlargement that has slightly increased compared to the prior CT. Right frontal ventriculostomy catheter tip projects adjacent to the septum pellucidum, unchanged. Patchy bilateral white matter hypoattenuation is noted consistent with moderate to advanced chronic microvascular ischemic change. Old right thalamic lacunar infarct. Vascular: No hyperdense vessel  or unexpected calcification. Skull: Normal. Negative for fracture or focal lesion. Sinuses/Orbits: Globes and orbits are unremarkable. Visualized sinuses are clear. Other: None. CT CERVICAL SPINE FINDINGS Alignment: Mild reversal the normal cervical lordosis, apex at C6. No spondylolisthesis. Skull base and vertebrae: No acute fracture. No primary bone lesion or focal pathologic process. Soft tissues and spinal canal: No prevertebral fluid or swelling. No visible canal hematoma. Disc levels: Moderate loss of disc height at C5-C6 and C6-C7 with mild disc bulging and endplate spurring. No convincing disc herniation. Upper chest: No acute findings. Left upper lobe nodule, 11 x 7 mm, mean 9 mm. Advanced emphysema in the upper lobes. Other: None. IMPRESSION: HEAD CT 1. Ventricles are slightly larger than on the prior CT. This would support shunt dysfunction. 2. No other change or acute intracranial abnormality. 3.  Moderate to advanced chronic microvascular ischemic change. CERVICAL CT 1. No fracture or acute finding. 2. 9 mm left upper lobe nodule. Patient is undergoing a chest CTA. Please refer to this study for further details. Electronically Signed   By: Lajean Manes M.D.   On: 09/06/2022 12:14   CT Cervical Spine Wo Contrast  Result Date: 09/06/2022 CLINICAL DATA:  Four VP shunt revision yesterday. Procedure canceled because patient fell. Patient has a right hip fracture. EXAM: CT HEAD WITHOUT CONTRAST CT CERVICAL SPINE WITHOUT CONTRAST TECHNIQUE: Multidetector CT imaging of the head and cervical spine was performed following the standard protocol without intravenous contrast. Multiplanar CT image reconstructions of the cervical spine were also generated. RADIATION DOSE REDUCTION: This exam was performed according to the departmental dose-optimization program which includes automated exposure control, adjustment of the mA and/or kV according to patient size and/or use of iterative reconstruction technique.  COMPARISON:  07/29/2022. FINDINGS: CT HEAD FINDINGS Brain: No evidence of acute infarction, hemorrhage, extra-axial collection or mass lesion/mass effect. There is ventricular enlargement that has slightly increased compared to the prior CT. Right frontal ventriculostomy catheter tip projects adjacent to the septum pellucidum, unchanged. Patchy bilateral white matter hypoattenuation is noted consistent with moderate to advanced chronic microvascular ischemic change. Old right thalamic lacunar infarct. Vascular: No hyperdense vessel or unexpected calcification. Skull: Normal. Negative for fracture or focal lesion. Sinuses/Orbits: Globes and orbits are unremarkable. Visualized sinuses are clear. Other: None. CT CERVICAL SPINE FINDINGS Alignment: Mild reversal the normal cervical lordosis, apex at C6. No spondylolisthesis. Skull base and vertebrae: No acute fracture. No primary bone lesion or focal pathologic process. Soft tissues and spinal canal: No prevertebral fluid or swelling. No visible canal hematoma. Disc levels: Moderate loss of disc height at C5-C6 and C6-C7 with mild disc bulging and endplate spurring. No convincing disc herniation. Upper chest: No acute findings. Left upper lobe nodule, 11 x 7 mm, mean 9 mm. Advanced emphysema in the upper lobes. Other: None. IMPRESSION: HEAD CT 1. Ventricles are slightly larger than on the prior CT. This would support shunt dysfunction. 2. No other change or acute intracranial abnormality. 3. Moderate to advanced chronic microvascular ischemic change. CERVICAL CT 1. No fracture or acute finding. 2. 9 mm left upper lobe nodule. Patient is undergoing a chest CTA. Please refer to this study for further details. Electronically Signed   By: Lajean Manes M.D.   On: 09/06/2022 12:14   DG Abd 1 View  Result Date: 09/06/2022 CLINICAL DATA:  Pt daughter reports pt was scheduled for a ventriculoperitoneal shunt revision yesterday but the surgery was cancelled because she fell  and hit her head on the same site as the shunt. Pt daughter reports she also hurt her right leg and then fell again this am around 4am and now has more severe pain in her right leg around her hip. Pt daughter reports she is concerned that she broke something. Pt daughter also reports pt has been increasingly SOB over the past week. Pt is on 2L of O2 at all times but gets winded and her sats drop when she walks. No fever, cough or pain. EXAM: ABDOMEN - 1 VIEW COMPARISON:  07/25/2022 FINDINGS: Right sided ventriculoperitoneal shunt catheter extends into the pelvis, unchanged from the prior exam and intact. Normal bowel gas pattern. Scattered arterial vascular calcifications. Soft tissues otherwise unremarkable. Acute right femoral neck fracture as detailed under the right hip radiographs. IMPRESSION: 1. Intact visualized portion of the ventriculoperitoneal catheter. Electronically Signed  By: Lajean Manes M.D.   On: 09/06/2022 11:04   DG Cervical Spine 1 View  Result Date: 09/06/2022 CLINICAL DATA:  Pt daughter reports pt was scheduled for a ventriculoperitoneal shunt revision yesterday but the surgery was cancelled because she fell and hit her head on the same site as the shunt. Pt daughter reports she also hurt her right leg and then fell again this am around 4am and now has more severe pain in her right leg around her hip. Pt daughter reports she is concerned that she broke something. Pt daughter also reports pt has been increasingly SOB over the past week. Pt is on 2L of O2 at all times but gets winded and her sats drop when she walks. No fever, cough or pain. EXAM: DG CERVICAL SPINE - 1 VIEW COMPARISON:  None Available. FINDINGS: Right sided ventriculostomy catheter is intact as it extends from the skull base to the upper chest. No fracture or acute finding. IMPRESSION: Intact visualized portion of the right neck VP shunt catheter. Electronically Signed   By: Lajean Manes M.D.   On: 09/06/2022 11:03   DG  Skull 1-3 Views  Result Date: 09/06/2022 CLINICAL DATA:  Pt daughter reports pt was scheduled for a ventriculoperitoneal shunt revision yesterday but the surgery was cancelled because she fell and hit her head on the same site as the shunt. Pt daughter reports she also hurt her right leg and then fell again this am around 4am and now has more severe pain in her right leg around her hip. Pt daughter reports she is concerned that she broke something. Pt daughter also reports pt has been increasingly SOB over the past week. Pt is on 2L of O2 at all times but gets winded and her sats drop when she walks. No fever, cough or pain. EXAM: SKULL - 1-3 VIEW COMPARISON:  Head CT, 07/29/2022. FINDINGS: Right parietal shunt catheter, ventriculostomy projecting centrally, unchanged from the prior head CT. Visualized portion of the ventriculoperitoneal shunt is intact. No skull fracture or bone lesion. IMPRESSION: Stable appearance of the right ventriculostomy catheter. Visualized portion the shunt catheter is intact. Electronically Signed   By: Lajean Manes M.D.   On: 09/06/2022 11:02   DG Chest 2 View  Result Date: 09/06/2022 CLINICAL DATA:  Pt daughter reports pt was scheduled for a ventriculoperitoneal shunt revision yesterday but the surgery was cancelled because she fell and hit her head on the same site as the shunt. Pt daughter reports she also hurt her right leg and then fell again this am around 4am and now has more severe pain in her right leg around her hip. Pt daughter reports she is concerned that she broke something. Pt daughter also reports pt has been increasingly SOB over the past week. Pt is on 2L of O2 at all times but gets winded and her sats drop when she walks. No fever, cough or pain. EXAM: CHEST - 2 VIEW COMPARISON:  07/24/2022.  CT, 08/27/2022. FINDINGS: Cardiac silhouette is normal in size. No mediastinal or hilar masses. Postsurgical changes with vascular clips overlying the AP window/superior  left hilum, stable. Lungs are hyperexpanded. There is relative volume loss on the left. Bilateral interstitial thickening, most evident in the lower lungs. Lucency in the upper lobes consistent with emphysema. No evidence of pneumonia or pulmonary edema. No pleural effusion or pneumothorax. Right anterior chest wall ventriculoperitoneal shunt catheter is intact. Skeletal structures are demineralized, grossly intact. IMPRESSION: 1. No acute cardiopulmonary disease. 2. Advanced  COPD and chronic changes from prior left lung surgery. Electronically Signed   By: Lajean Manes M.D.   On: 09/06/2022 11:00   DG Hip Unilat W or Wo Pelvis 2-3 Views Right  Result Date: 09/06/2022 CLINICAL DATA:  Patient fell are yesterday and hit her head. She was scheduled for VP shunt revision. EXAM: DG HIP (WITH OR WITHOUT PELVIS) 2-3V RIGHT COMPARISON:  None Available. FINDINGS: Subcapital right femoral neck fracture, non comminuted, mildly displaced, distal fracture component displacing superiorly by 1.5 cm, with Verus angulation. No other fractures.  No bone lesions. Hip joints, SI joints and pubic symphysis are normally aligned. Distal aspect of an intact ventriculoperitoneal shunt curls in the pelvis. IMPRESSION: 1. Mildly displaced, varus angulated subcapital right femoral neck fracture. No dislocation. Electronically Signed   By: Lajean Manes M.D.   On: 09/06/2022 10:59    Labs on Admission: I have personally reviewed following labs CBC: Recent Labs  Lab 09/06/22 1020  WBC 16.1*  HGB 9.5*  HCT 31.5*  MCV 91.3  PLT 010   Basic Metabolic Panel: Recent Labs  Lab 09/06/22 1020  NA 141  K 2.8*  CL 103  CO2 32  GLUCOSE 169*  BUN 23  CREATININE 0.65  CALCIUM 9.0  MG 2.0   GFR: Estimated Creatinine Clearance: 43.7 mL/min (by C-G formula based on SCr of 0.65 mg/dL).  Liver Function Tests: Recent Labs  Lab 09/06/22 1020  AST 15  ALT 13  ALKPHOS 60  BILITOT 0.6  PROT 7.3  ALBUMIN 3.5   Recent Labs   Lab 09/06/22 1020  LIPASE 25   BNP (last 3 results) Recent Labs    04/04/22 1025  PROBNP 13.0   Urine analysis:    Component Value Date/Time   COLORURINE YELLOW (A) 08/01/2022 1540   APPEARANCEUR Clear 08/20/2022 0927   LABSPEC 1.016 08/01/2022 1540   PHURINE 6.0 08/01/2022 1540   GLUCOSEU Negative 08/20/2022 0927   HGBUR NEGATIVE 08/01/2022 1540   BILIRUBINUR Negative 08/20/2022 0927   KETONESUR NEGATIVE 08/01/2022 1540   PROTEINUR 1+ (A) 08/20/2022 0927   PROTEINUR NEGATIVE 08/01/2022 1540   UROBILINOGEN 0.2 06/06/2022 1543   NITRITE Negative 08/20/2022 0927   NITRITE NEGATIVE 08/01/2022 1540   LEUKOCYTESUR Negative 08/20/2022 0927   LEUKOCYTESUR NEGATIVE 08/01/2022 1540   Dr. Tobie Poet [facility]  If 7PM-7AM, please contact overnight-coverage provider If 7AM-7PM, please contact day coverage provider [email or web address]  09/06/2022, 4:10 PM

## 2022-09-06 NOTE — ED Notes (Signed)
Pt. In bed, daughter at bedside, pt. C/o leg pain. Pt. Alert and oriented, NAD. Warm blanket provided.

## 2022-09-06 NOTE — ED Notes (Signed)
Pt's daughter, [name] [phone]) called and updated on POC and pt's room number.

## 2022-09-07 ENCOUNTER — Inpatient Hospital Stay: Payer: Medicare PPO | Admitting: Anesthesiology

## 2022-09-07 ENCOUNTER — Other Ambulatory Visit: Payer: Self-pay

## 2022-09-07 ENCOUNTER — Encounter
Admission: EM | Disposition: A | Discharge status: Skilled Nursing Facility | Payer: Self-pay | Source: Home / Self Care | Attending: Internal Medicine

## 2022-09-07 ENCOUNTER — Inpatient Hospital Stay: Payer: Medicare PPO

## 2022-09-07 DIAGNOSIS — S72001A Fracture of unspecified part of neck of right femur, initial encounter for closed fracture: Secondary | ICD-10-CM | POA: Diagnosis not present

## 2022-09-07 HISTORY — PX: HIP ARTHROPLASTY: SHX981

## 2022-09-07 LAB — GLUCOSE, CAPILLARY
Glucose-Capillary: 85 mg/dL (ref 70–99)
Glucose-Capillary: 87 mg/dL (ref 70–99)
Glucose-Capillary: 105 mg/dL — ABNORMAL HIGH (ref 70–99)

## 2022-09-07 LAB — BASIC METABOLIC PANEL
Anion gap: 4 — ABNORMAL LOW (ref 5–15)
BUN: 23 mg/dL (ref 8–23)
CO2: 30 mmol/L (ref 22–32)
Calcium: 8.7 mg/dL — ABNORMAL LOW (ref 8.9–10.3)
Chloride: 107 mmol/L (ref 98–111)
Creatinine, Ser: 0.6 mg/dL (ref 0.44–1.00)
GFR, Estimated: >60 mL/min (ref >60)
Glucose, Bld: 87 mg/dL (ref 70–99)
Potassium: 3.8 mmol/L (ref 3.5–5.1)
Sodium: 141 mmol/L (ref 135–145)

## 2022-09-07 LAB — CBC
HCT: 28.7 % — ABNORMAL LOW (ref 36.0–46.0)
Hemoglobin: 8.5 g/dL — ABNORMAL LOW (ref 12.0–15.0)
MCH: 26.9 pg (ref 26.0–34.0)
MCHC: 29.6 g/dL — ABNORMAL LOW (ref 30.0–36.0)
MCV: 90.8 fL (ref 80.0–100.0)
Platelets: 288 10*3/uL (ref 150–400)
RBC: 3.16 MIL/uL — ABNORMAL LOW (ref 3.87–5.11)
RDW: 15.3 % (ref 11.5–15.5)
WBC: 10.6 10*3/uL — ABNORMAL HIGH (ref 4.0–10.5)
nRBC: 0 % (ref 0.0–0.2)

## 2022-09-07 SURGERY — HEMIARTHROPLASTY, HIP, DIRECT ANTERIOR APPROACH, FOR FRACTURE
Anesthesia: General | Site: Hip | Laterality: Right

## 2022-09-07 MED ORDER — ACETAMINOPHEN 10 MG/ML IV SOLN
INTRAVENOUS | Status: AC
  Filled 2022-09-07: qty 100

## 2022-09-07 MED ORDER — TRANEXAMIC ACID 1000 MG/10ML IV SOLN
INTRAVENOUS | Status: AC
  Filled 2022-09-07: qty 10

## 2022-09-07 MED ORDER — PROPOFOL 10 MG/ML IV BOLUS
INTRAVENOUS | Status: DC | PRN
  Administered 2022-09-07: 80 mg via INTRAVENOUS

## 2022-09-07 MED ORDER — ADULT MULTIVITAMIN W/MINERALS CH
1.0000 | ORAL_TABLET | Freq: Every day | ORAL | Status: DC
  Administered 2022-09-08 – 2022-09-11 (×4): 1 via ORAL
  Filled 2022-09-07 (×4): qty 1

## 2022-09-07 MED ORDER — PHENYLEPHRINE HCL (PRESSORS) 10 MG/ML IV SOLN
INTRAVENOUS | Status: DC | PRN
  Administered 2022-09-07 (×3): 80 ug via INTRAVENOUS

## 2022-09-07 MED ORDER — HYDROCODONE-ACETAMINOPHEN 5-325 MG PO TABS
1.0000 | ORAL_TABLET | ORAL | Status: DC | PRN
  Administered 2022-09-07: 2 via ORAL
  Administered 2022-09-09: 1 via ORAL
  Administered 2022-09-10 (×2): 2 via ORAL
  Filled 2022-09-07 (×3): qty 2
  Filled 2022-09-07: qty 1

## 2022-09-07 MED ORDER — BUPIVACAINE-EPINEPHRINE (PF) 0.25% -1:200000 IJ SOLN
INTRAMUSCULAR | Status: DC | PRN
  Administered 2022-09-07: 30 mL via PERINEURAL

## 2022-09-07 MED ORDER — OXYCODONE HCL 5 MG/5ML PO SOLN: 5.0000 mg | Freq: Once | ORAL | Status: DC | PRN

## 2022-09-07 MED ORDER — CEFAZOLIN SODIUM-DEXTROSE 2-4 GM/100ML-% IV SOLN
INTRAVENOUS | Status: AC
  Filled 2022-09-07: qty 100

## 2022-09-07 MED ORDER — KETOROLAC TROMETHAMINE 15 MG/ML IJ SOLN
7.5000 mg | Freq: Four times a day (QID) | INTRAMUSCULAR | Status: AC
  Administered 2022-09-07 – 2022-09-08 (×4): 7.5 mg via INTRAVENOUS
  Filled 2022-09-07 (×4): qty 1

## 2022-09-07 MED ORDER — DEXMEDETOMIDINE HCL IN NACL 80 MCG/20ML IV SOLN
INTRAVENOUS | Status: DC | PRN
  Administered 2022-09-07 (×2): 4 ug via BUCCAL

## 2022-09-07 MED ORDER — IPRATROPIUM-ALBUTEROL 0.5-2.5 (3) MG/3ML IN SOLN
RESPIRATORY_TRACT | Status: AC
  Filled 2022-09-07: qty 3

## 2022-09-07 MED ORDER — ACETAMINOPHEN 325 MG PO TABS
325.0000 mg | ORAL_TABLET | Freq: Four times a day (QID) | ORAL | Status: DC | PRN
  Administered 2022-09-08 – 2022-09-12 (×7): 650 mg via ORAL
  Filled 2022-09-07 (×7): qty 2

## 2022-09-07 MED ORDER — TRANEXAMIC ACID-NACL 1000-0.7 MG/100ML-% IV SOLN
1000.0000 mg | INTRAVENOUS | Status: AC
  Administered 2022-09-07: 1000 mg via INTRAVENOUS
  Filled 2022-09-07: qty 100

## 2022-09-07 MED ORDER — BUPIVACAINE-EPINEPHRINE (PF) 0.25% -1:200000 IJ SOLN
INTRAMUSCULAR | Status: AC
  Filled 2022-09-07: qty 30

## 2022-09-07 MED ORDER — FENTANYL CITRATE (PF) 100 MCG/2ML IJ SOLN: 25.0000 ug | INTRAMUSCULAR | Status: DC | PRN

## 2022-09-07 MED ORDER — HYDROCODONE-ACETAMINOPHEN 7.5-325 MG PO TABS
1.0000 | ORAL_TABLET | ORAL | Status: DC | PRN
  Administered 2022-09-11 – 2022-09-12 (×3): 2 via ORAL
  Filled 2022-09-07 (×3): qty 2

## 2022-09-07 MED ORDER — ENOXAPARIN SODIUM 40 MG/0.4ML IJ SOSY
40.0000 mg | PREFILLED_SYRINGE | INTRAMUSCULAR | Status: DC
  Administered 2022-09-08 – 2022-09-12 (×5): 40 mg via SUBCUTANEOUS
  Filled 2022-09-07 (×5): qty 0.4

## 2022-09-07 MED ORDER — ACETAMINOPHEN 10 MG/ML IV SOLN
INTRAVENOUS | Status: DC | PRN
  Administered 2022-09-07: 1000 mg via INTRAVENOUS

## 2022-09-07 MED ORDER — ENSURE ENLIVE PO LIQD
237.0000 mL | Freq: Two times a day (BID) | ORAL | Status: DC
  Administered 2022-09-07 – 2022-09-12 (×8): 237 mL via ORAL
  Filled 2022-09-07: qty 237

## 2022-09-07 MED ORDER — ROCURONIUM BROMIDE 100 MG/10ML IV SOLN
INTRAVENOUS | Status: DC | PRN
  Administered 2022-09-07: 40 mg via INTRAVENOUS

## 2022-09-07 MED ORDER — LIDOCAINE HCL (CARDIAC) PF 100 MG/5ML IV SOSY
PREFILLED_SYRINGE | INTRAVENOUS | Status: DC | PRN
  Administered 2022-09-07: 60 mg via INTRAVENOUS

## 2022-09-07 MED ORDER — MORPHINE SULFATE (PF) 2 MG/ML IV SOLN: 0.5000 mg | INTRAVENOUS | Status: DC | PRN

## 2022-09-07 MED ORDER — CEFAZOLIN SODIUM-DEXTROSE 2-4 GM/100ML-% IV SOLN
2.0000 g | INTRAVENOUS | Status: AC
  Administered 2022-09-07: 2 g via INTRAVENOUS

## 2022-09-07 MED ORDER — FENTANYL CITRATE (PF) 100 MCG/2ML IJ SOLN
INTRAMUSCULAR | Status: DC | PRN
  Administered 2022-09-07 (×2): 50 ug via INTRAVENOUS

## 2022-09-07 MED ORDER — HYDROMORPHONE HCL 1 MG/ML IJ SOLN
INTRAMUSCULAR | Status: DC | PRN
  Administered 2022-09-07 (×2): .25 mg via INTRAVENOUS

## 2022-09-07 MED ORDER — SODIUM CHLORIDE 0.9 % IR SOLN
Status: DC | PRN
  Administered 2022-09-07: 1000 mL

## 2022-09-07 MED ORDER — SODIUM CHLORIDE 0.9 % IR SOLN
Status: DC | PRN
  Administered 2022-09-07: 3000 mL

## 2022-09-07 MED ORDER — ONDANSETRON HCL 4 MG/2ML IJ SOLN
INTRAMUSCULAR | Status: DC | PRN
  Administered 2022-09-07: 4 mg via INTRAVENOUS

## 2022-09-07 MED ORDER — OXYCODONE HCL 5 MG PO TABS: 5.0000 mg | ORAL_TABLET | Freq: Once | ORAL | Status: DC | PRN

## 2022-09-07 MED ORDER — SODIUM CHLORIDE 0.9 % IV SOLN: INTRAVENOUS | Status: DC | PRN

## 2022-09-07 MED ORDER — HYDROMORPHONE HCL 1 MG/ML IJ SOLN
INTRAMUSCULAR | Status: AC
  Filled 2022-09-07: qty 1

## 2022-09-07 MED ORDER — IPRATROPIUM-ALBUTEROL 0.5-2.5 (3) MG/3ML IN SOLN
3.0000 mL | Freq: Once | RESPIRATORY_TRACT | Status: AC
  Administered 2022-09-07: 3 mL via RESPIRATORY_TRACT

## 2022-09-07 MED ORDER — FENTANYL CITRATE (PF) 100 MCG/2ML IJ SOLN
INTRAMUSCULAR | Status: AC
  Filled 2022-09-07: qty 2

## 2022-09-07 MED ORDER — DEXAMETHASONE SODIUM PHOSPHATE 10 MG/ML IJ SOLN
INTRAMUSCULAR | Status: DC | PRN
  Administered 2022-09-07: 5 mg via INTRAVENOUS

## 2022-09-07 MED ORDER — SUGAMMADEX SODIUM 200 MG/2ML IV SOLN
INTRAVENOUS | Status: DC | PRN
  Administered 2022-09-07: 100 mg via INTRAVENOUS

## 2022-09-07 SURGICAL SUPPLY — 79 items
BLADE SAGITTAL WIDE XTHICK NO (BLADE) ×1 IMPLANT
BLADE SURG SZ10 CARB STEEL (BLADE) ×1 IMPLANT
BNDG COHESIVE 4X5 TAN STRL LF (GAUZE/BANDAGES/DRESSINGS) ×2 IMPLANT
BNDG COHESIVE 6X5 TAN ST LF (GAUZE/BANDAGES/DRESSINGS) ×1 IMPLANT
BONE CEMENT GENTAMICIN (Cement) ×2 IMPLANT
BRUSH SCRUB EZ  4% CHG (MISCELLANEOUS) ×1
BRUSH SCRUB EZ 4% CHG (MISCELLANEOUS) ×1 IMPLANT
CEMENT BONE GENTAMICIN 40 (Cement) ×2 IMPLANT
CENTRALIZER STEM DISTAL 8 (Knees) IMPLANT
CHLORAPREP W/TINT 26 (MISCELLANEOUS) ×2 IMPLANT
COVER BACK TABLE REUSABLE LG (DRAPES) ×1 IMPLANT
DRAPE 3/4 80X56 (DRAPES) ×2 IMPLANT
DRAPE C-ARM 42X72 X-RAY (DRAPES) ×1 IMPLANT
DRAPE INCISE IOBAN 66X60 STRL (DRAPES) ×1 IMPLANT
DRAPE ORTHO SPLIT 77X108 STRL (DRAPES) ×1
DRAPE STERI IOBAN 125X83 (DRAPES) ×1 IMPLANT
DRAPE SURG 17X11 SM STRL (DRAPES) ×1 IMPLANT
DRAPE SURG ORHT 6 SPLT 77X108 (DRAPES) ×1 IMPLANT
DRAPE U-SHAPE 47X51 STRL (DRAPES) ×1 IMPLANT
DRAPE U-SHAPE 48X52 POLY STRL (PACKS) ×1 IMPLANT
DRSG AQUACEL AG ADV 3.5X10 (GAUZE/BANDAGES/DRESSINGS) ×1 IMPLANT
DRSG AQUACEL AG ADV 3.5X14 (GAUZE/BANDAGES/DRESSINGS) ×1 IMPLANT
ELECT CAUTERY BLADE 6.4 (BLADE) ×1 IMPLANT
ELECT REM PT RETURN 9FT ADLT (ELECTROSURGICAL) ×1
ELECTRODE REM PT RTRN 9FT ADLT (ELECTROSURGICAL) ×1 IMPLANT
GAUZE 4X4 16PLY ~~LOC~~+RFID DBL (SPONGE) ×1 IMPLANT
GAUZE XEROFORM 1X8 LF (GAUZE/BANDAGES/DRESSINGS) ×1 IMPLANT
GLOVE BIOGEL PI IND STRL 8.5 (GLOVE) ×1 IMPLANT
GLOVE PI ORTHO PRO STRL SZ8 (GLOVE) ×1 IMPLANT
GLOVE SURG ORTHO 8.5 STRL (GLOVE) ×1 IMPLANT
GOWN STRL REUS W/ TWL LRG LVL3 (GOWN DISPOSABLE) ×1 IMPLANT
GOWN STRL REUS W/ TWL XL LVL3 (GOWN DISPOSABLE) ×2 IMPLANT
GOWN STRL REUS W/TWL LRG LVL3 (GOWN DISPOSABLE) ×1
GOWN STRL REUS W/TWL XL LVL3 (GOWN DISPOSABLE) ×2
HEAD 28MM 0 (Hips) IMPLANT
HEAD BIPOLAR 44MM (Hips) IMPLANT
HOLSTER ELECTROSUGICAL PENCIL (MISCELLANEOUS) ×1 IMPLANT
IRRIGATION SURGIPHOR STRL (IV SOLUTION) ×1 IMPLANT
IV NS 1000ML (IV SOLUTION) ×1
IV NS 1000ML BAXH (IV SOLUTION) ×1 IMPLANT
IV NS 250ML (IV SOLUTION) ×1
IV NS 250ML BAXH (IV SOLUTION) ×1 IMPLANT
IV NS IRRIG 3000ML ARTHROMATIC (IV SOLUTION) ×1 IMPLANT
KIT TURNOVER CYSTO (KITS) ×1 IMPLANT
MANIFOLD NEPTUNE II (INSTRUMENTS) ×1 IMPLANT
MAT ABSORB  FLUID 56X50 GRAY (MISCELLANEOUS) ×1
MAT ABSORB FLUID 56X50 GRAY (MISCELLANEOUS) ×1 IMPLANT
NDL FILTER BLUNT 18X1 1/2 (NEEDLE) ×1 IMPLANT
NDL SAFETY ECLIP 18X1.5 (MISCELLANEOUS) ×2 IMPLANT
NDL SPNL 20GX3.5 QUINCKE YW (NEEDLE) ×1 IMPLANT
NEEDLE FILTER BLUNT 18X1 1/2 (NEEDLE) ×1 IMPLANT
NEEDLE HYPO 22GX1.5 SAFETY (NEEDLE) ×1 IMPLANT
NEEDLE SPNL 20GX3.5 QUINCKE YW (NEEDLE) ×1 IMPLANT
NS IRRIG 1000ML POUR BTL (IV SOLUTION) ×1 IMPLANT
PACK HIP PROSTHESIS (MISCELLANEOUS) ×1 IMPLANT
PADDING CAST BLEND 4X4 NS (MISCELLANEOUS) ×2 IMPLANT
PILLOW ABDUCTION FOAM SM (MISCELLANEOUS) IMPLANT
PILLOW ABDUCTION MEDIUM (MISCELLANEOUS) ×1 IMPLANT
PULSAVAC PLUS IRRIG FAN TIP (DISPOSABLE) ×1
RETRIEVER SUT HEWSON (MISCELLANEOUS) IMPLANT
SPONGE T-LAP 18X18 ~~LOC~~+RFID (SPONGE) ×4 IMPLANT
STAPLER SKIN PROX 35W (STAPLE) ×1 IMPLANT
STEM FEM SYNERGY SZ10 (Stem) IMPLANT
SUT BONE WAX W31G (SUTURE) ×1 IMPLANT
SUT DVC 2 QUILL PDO  T11 36X36 (SUTURE) ×1
SUT DVC 2 QUILL PDO T11 36X36 (SUTURE) ×1 IMPLANT
SUT ETHIBOND 5-0 MS/4 CCS GRN (SUTURE) ×1
SUT VIC AB 2-0 CT1 (SUTURE) ×1 IMPLANT
SUT VIC AB 2-0 CT1 18 (SUTURE) ×1 IMPLANT
SUT VIC AB PLUS 45CM 1-MO-4 (SUTURE) ×1 IMPLANT
SUTURE ETHBND 5-0 MS/4 CCS GRN (SUTURE) ×1 IMPLANT
SYR 20ML LL LF (SYRINGE) ×1 IMPLANT
TIP BRUSH PULSAVAC PLUS 24.33 (MISCELLANEOUS) ×1 IMPLANT
TIP FAN IRRIG PULSAVAC PLUS (DISPOSABLE) ×1 IMPLANT
TOWER CARTRIDGE SMART MIX (DISPOSABLE) ×1 IMPLANT
TRAP FLUID SMOKE EVACUATOR (MISCELLANEOUS) ×1 IMPLANT
TUBE SUCT KAM VAC (TUBING) ×1 IMPLANT
WAND WEREWOLF FASTSEAL 6.0 (MISCELLANEOUS) ×1 IMPLANT
WATER STERILE IRR 500ML POUR (IV SOLUTION) ×1 IMPLANT

## 2022-09-07 NOTE — Plan of Care (Signed)

## 2022-09-07 NOTE — Anesthesia Procedure Notes (Signed)
Procedure Name: Intubation Date/Time: 09/07/2022 11:02 AM  Performed by: Jerrye Noble, CRNAPre-anesthesia Checklist: Patient identified, Emergency Drugs available, Suction available and Patient being monitored Patient Re-evaluated:Patient Re-evaluated prior to induction Oxygen Delivery Method: Circle system utilized Preoxygenation: Pre-oxygenation with 100% oxygen Induction Type: IV induction Ventilation: Mask ventilation without difficulty Laryngoscope Size: McGraph and 3 Grade View: Grade I Tube type: Oral Tube size: 6.5 mm Number of attempts: 1 Airway Equipment and Method: Stylet and Video-laryngoscopy Placement Confirmation: ETT inserted through vocal cords under direct vision, positive ETCO2 and breath sounds checked- equal and bilateral Secured at: 22 cm Tube secured with: Tape Dental Injury: Teeth and Oropharynx as per pre-operative assessment

## 2022-09-07 NOTE — Progress Notes (Signed)
PROGRESS NOTE    Amanda Porter  ACZ:[id number] DOB: Oct 28, 1946 DOA: 09/06/2022 PCP: Pleas Koch, NP     Brief Narrative:  Amanda Porter is a 76 year old female with medical history of normal pressure hydrocephalus, status post VP shunt placement and shunt malfunction, COPD, chronic respiratory failure, history of CVA, depression, dementia, non-insulin-dependent diabetes mellitus, dysphagia, dyslipidemia, who presented to the emergency department for chief concerns of frequent falls.  X-ray of the right hip showed mildly displaced right femoral neck fracture.  Orthopedic surgery was consulted.  New events last 24 hours / Subjective: Patient feeling well, no complaints this morning.  Patient scheduled to go to the OR this morning.  Assessment & Plan:   Principal Problem:   Closed displaced fracture of right femoral neck (HCC) Active Problems:   NPH (normal pressure hydrocephalus) (HCC)   Hypokalemia   Dementia without behavioral disturbance (HCC)   S/P VP shunt   Type 2 diabetes mellitus with hyperglycemia (HCC)   Anxiety and depression   History of CVA (cerebrovascular accident)   Hyperlipidemia   Dysphagia   Essential hypertension   Memory disturbance   DNR (do not resuscitate)   Multiple lung nodules   Right hip fracture status post mechanical fall -Orthopedic surgery consulted, OR planned for today -She will need PT postoperatively  NPH status post VP shunt -Has outpatient VP shunt surgery scheduled 10/20  Dementia -Memantine  Diabetes mellitus -Sliding scale insulin  Mood disorder -Bupropion, BuSpar, klonopin   Hyperlipidemia -Crestor  Hypertension -Norvasc  COPD -Without exacerbation  Stable 12 mm solid nodule in the anterior apical region of the left lung. Consider continued follow-up by chest CT in 6 months, or further characterization with PET-CT.   DVT prophylaxis:  Place TED hose Start: 09/06/22 1250  Code Status: DNR Family Communication:  None at bedside Disposition Plan:  Status is: Inpatient Remains inpatient appropriate because: OR today   Antimicrobials:  Anti-infectives (From admission, onward)    Start     Dose/Rate Route Frequency Ordered Stop   09/07/22 0954  ceFAZolin (ANCEF) 2-4 GM/100ML-% IVPB       Note to Pharmacy: Mirna Mires: cabinet override      09/07/22 0954 09/07/22 1130   09/07/22 0618  [MAR Hold]  ceFAZolin (ANCEF) IVPB 2g/100 mL premix        (MAR Hold since Sun 09/07/2022 at 1054.Hold Reason: Transfer to a Procedural area)   2 g 200 mL/hr over 30 Minutes Intravenous 30 min pre-op 09/07/22 0619 09/07/22 1137        Objective: Vitals:   09/06/22 1628 09/07/22 0019 09/07/22 0721 09/07/22 0843  BP: (!) 124/102 124/75  (!) 151/57  Pulse: (!) 110 99  (!) 107  Resp: 18 18  16   Temp:  98.5 F (36.9 C)  99.4 F (37.4 C)  TempSrc:      SpO2: 100% 96% 98% 97%  Weight:      Height:        Intake/Output Summary (Last 24 hours) at 09/07/2022 1244 Last data filed at 09/07/2022 1132 Gross per 24 hour  Intake 640.94 ml  Output --  Net 640.94 ml   Filed Weights   09/06/22 1006  Weight: 47 kg    Examination:  General exam: Appears calm and comfortable  Respiratory system: Clear to auscultation. Respiratory effort normal. No respiratory distress. No conversational dyspnea.  Cardiovascular system: S1 & S2 heard, RRR. No murmurs. No pedal edema. Gastrointestinal system: Abdomen is nondistended, soft and nontender. Normal bowel  sounds heard. Central nervous system: Alert and oriented. No focal neurological deficits. Speech clear.  Extremities: Symmetric in appearance  Skin: No rashes, lesions or ulcers on exposed skin  Psychiatry: Judgement and insight appear normal. Mood & affect appropriate.   Data Reviewed: I have personally reviewed following labs and imaging studies  CBC: Recent Labs  Lab 09/06/22 1020 09/07/22 0424  WBC 16.1* 10.6*  HGB 9.5* 8.5*  HCT 31.5* 28.7*  MCV  91.3 90.8  PLT 298 408   Basic Metabolic Panel: Recent Labs  Lab 09/06/22 1020 09/07/22 0424  NA 141 141  K 2.8* 3.8  CL 103 107  CO2 32 30  GLUCOSE 169* 87  BUN 23 23  CREATININE 0.65 0.60  CALCIUM 9.0 8.7*  MG 2.0  --    GFR: Estimated Creatinine Clearance: 43.7 mL/min (by C-G formula based on SCr of 0.6 mg/dL). Liver Function Tests: Recent Labs  Lab 09/06/22 1020  AST 15  ALT 13  ALKPHOS 60  BILITOT 0.6  PROT 7.3  ALBUMIN 3.5   Recent Labs  Lab 09/06/22 1020  LIPASE 25   No results for input(s): "AMMONIA" in the last 168 hours. Coagulation Profile: No results for input(s): "INR", "PROTIME" in the last 168 hours. Cardiac Enzymes: No results for input(s): "CKTOTAL", "CKMB", "CKMBINDEX", "TROPONINI" in the last 168 hours. BNP (last 3 results) Recent Labs    04/04/22 1025  PROBNP 13.0   HbA1C: No results for input(s): "HGBA1C" in the last 72 hours. CBG: Recent Labs  Lab 09/06/22 1626 09/06/22 2227 09/07/22 0805  GLUCAP 102* 116* 85   Lipid Profile: No results for input(s): "CHOL", "HDL", "LDLCALC", "TRIG", "CHOLHDL", "LDLDIRECT" in the last 72 hours. Thyroid Function Tests: No results for input(s): "TSH", "T4TOTAL", "FREET4", "T3FREE", "THYROIDAB" in the last 72 hours. Anemia Panel: No results for input(s): "VITAMINB12", "FOLATE", "FERRITIN", "TIBC", "IRON", "RETICCTPCT" in the last 72 hours. Sepsis Labs: No results for input(s): "PROCALCITON", "LATICACIDVEN" in the last 168 hours.  Recent Results (from the past 240 hour(s))  SARS Coronavirus 2 by RT PCR (hospital order, performed in [facility] hospital lab) *cepheid single result test* Anterior Nasal Swab     Status: None   Collection Time: 09/06/22 10:28 AM   Specimen: Anterior Nasal Swab  Result Value Ref Range Status   SARS Coronavirus 2 by RT PCR NEGATIVE NEGATIVE Final    Comment: (NOTE) SARS-CoV-2 target nucleic acids are NOT DETECTED.  The SARS-CoV-2 RNA is generally detectable in  upper and lower respiratory specimens during the acute phase of infection. The lowest concentration of SARS-CoV-2 viral copies this assay can detect is 250 copies / mL. A negative result does not preclude SARS-CoV-2 infection and should not be used as the sole basis for treatment or other patient management decisions.  A negative result may occur with improper specimen collection / handling, submission of specimen other than nasopharyngeal swab, presence of viral mutation(s) within the areas targeted by this assay, and inadequate number of viral copies (<250 copies / mL). A negative result must be combined with clinical observations, patient history, and epidemiological information.  Fact Sheet for Patients:   https://www.patel.info/  Fact Sheet for Healthcare Providers: https://hall.com/  This test is not yet approved or  cleared by the [place] FDA and has been authorized for detection and/or diagnosis of SARS-CoV-2 by FDA under an Emergency Use Authorization (EUA).  This EUA will remain in effect (meaning this test can be used) for the duration of the COVID-19 declaration under  Section 564(b)(1) of the Act, 21 U.S.C. section 360bbb-3(b)(1), unless the authorization is terminated or revoked sooner.  Performed at [facility], [address], [place], [place] [address]       Radiology Studies: CT Angio Chest PE W and/or Wo Contrast  Result Date: 09/06/2022 CLINICAL DATA:  Worsening shortness of breath and hypoxia for 1 week. High probability for pulmonary embolism. EXAM: CT ANGIOGRAPHY CHEST WITH CONTRAST TECHNIQUE: Multidetector CT imaging of the chest was performed using the standard protocol during bolus administration of intravenous contrast. Multiplanar CT image reconstructions and MIPs were obtained to evaluate the vascular anatomy. RADIATION DOSE REDUCTION: This exam was performed according to the departmental  dose-optimization program which includes automated exposure control, adjustment of the mA and/or kV according to patient size and/or use of iterative reconstruction technique. CONTRAST:  51mL OMNIPAQUE IOHEXOL 350 MG/ML SOLN COMPARISON:  Noncontrast chest CT on 08/27/2022 from [facility], and 05/14/2022 from [facility] hospital FINDINGS: Cardiovascular: Satisfactory opacification of pulmonary arteries noted, and no pulmonary emboli identified. No evidence of thoracic aortic dissection or aneurysm. Aortic and coronary atherosclerotic calcification incidentally noted. Moderate to severe left ventricular hypertrophy also noted. Mediastinum/Nodes: No masses or pathologically enlarged lymph nodes identified. Lungs/Pleura: Moderate centrilobular emphysema again noted. Postop changes are again seen from prior left upper lobe resection. A solid nodule is again seen in the anterior apical region of the left lung which measures 12 x 7 mm on image 36/5. This remains stable compared to previous studies. Few other tiny less than 5 mm pulmonary nodules in the peripheral left lung also remain stable. Upper abdomen: Cholelithiasis again noted. Musculoskeletal: No suspicious bone lesions identified. Review of the MIP images confirms the above findings. IMPRESSION: No evidence of pulmonary embolism. Stable 12 mm solid nodule in the anterior apical region of the left lung. Consider continued follow-up by chest CT in 6 months, or further characterization with PET-CT. Cholelithiasis incidentally noted. Aortic Atherosclerosis (ICD10-I70.0) and Emphysema (ICD10-J43.9). Electronically Signed   By: Marlaine Hind M.D.   On: 09/06/2022 12:24   CT HEAD WO CONTRAST (5MM)  Result Date: 09/06/2022 CLINICAL DATA:  Four VP shunt revision yesterday. Procedure canceled because patient fell. Patient has a right hip fracture. EXAM: CT HEAD WITHOUT CONTRAST CT CERVICAL SPINE WITHOUT CONTRAST TECHNIQUE: Multidetector CT imaging  of the head and cervical spine was performed following the standard protocol without intravenous contrast. Multiplanar CT image reconstructions of the cervical spine were also generated. RADIATION DOSE REDUCTION: This exam was performed according to the departmental dose-optimization program which includes automated exposure control, adjustment of the mA and/or kV according to patient size and/or use of iterative reconstruction technique. COMPARISON:  07/29/2022. FINDINGS: CT HEAD FINDINGS Brain: No evidence of acute infarction, hemorrhage, extra-axial collection or mass lesion/mass effect. There is ventricular enlargement that has slightly increased compared to the prior CT. Right frontal ventriculostomy catheter tip projects adjacent to the septum pellucidum, unchanged. Patchy bilateral white matter hypoattenuation is noted consistent with moderate to advanced chronic microvascular ischemic change. Old right thalamic lacunar infarct. Vascular: No hyperdense vessel or unexpected calcification. Skull: Normal. Negative for fracture or focal lesion. Sinuses/Orbits: Globes and orbits are unremarkable. Visualized sinuses are clear. Other: None. CT CERVICAL SPINE FINDINGS Alignment: Mild reversal the normal cervical lordosis, apex at C6. No spondylolisthesis. Skull base and vertebrae: No acute fracture. No primary bone lesion or focal pathologic process. Soft tissues and spinal canal: No prevertebral fluid or swelling. No visible canal hematoma. Disc levels: Moderate  loss of disc height at C5-C6 and C6-C7 with mild disc bulging and endplate spurring. No convincing disc herniation. Upper chest: No acute findings. Left upper lobe nodule, 11 x 7 mm, mean 9 mm. Advanced emphysema in the upper lobes. Other: None. IMPRESSION: HEAD CT 1. Ventricles are slightly larger than on the prior CT. This would support shunt dysfunction. 2. No other change or acute intracranial abnormality. 3. Moderate to advanced chronic microvascular  ischemic change. CERVICAL CT 1. No fracture or acute finding. 2. 9 mm left upper lobe nodule. Patient is undergoing a chest CTA. Please refer to this study for further details. Electronically Signed   By: Lajean Manes M.D.   On: 09/06/2022 12:14   CT Cervical Spine Wo Contrast  Result Date: 09/06/2022 CLINICAL DATA:  Four VP shunt revision yesterday. Procedure canceled because patient fell. Patient has a right hip fracture. EXAM: CT HEAD WITHOUT CONTRAST CT CERVICAL SPINE WITHOUT CONTRAST TECHNIQUE: Multidetector CT imaging of the head and cervical spine was performed following the standard protocol without intravenous contrast. Multiplanar CT image reconstructions of the cervical spine were also generated. RADIATION DOSE REDUCTION: This exam was performed according to the departmental dose-optimization program which includes automated exposure control, adjustment of the mA and/or kV according to patient size and/or use of iterative reconstruction technique. COMPARISON:  07/29/2022. FINDINGS: CT HEAD FINDINGS Brain: No evidence of acute infarction, hemorrhage, extra-axial collection or mass lesion/mass effect. There is ventricular enlargement that has slightly increased compared to the prior CT. Right frontal ventriculostomy catheter tip projects adjacent to the septum pellucidum, unchanged. Patchy bilateral white matter hypoattenuation is noted consistent with moderate to advanced chronic microvascular ischemic change. Old right thalamic lacunar infarct. Vascular: No hyperdense vessel or unexpected calcification. Skull: Normal. Negative for fracture or focal lesion. Sinuses/Orbits: Globes and orbits are unremarkable. Visualized sinuses are clear. Other: None. CT CERVICAL SPINE FINDINGS Alignment: Mild reversal the normal cervical lordosis, apex at C6. No spondylolisthesis. Skull base and vertebrae: No acute fracture. No primary bone lesion or focal pathologic process. Soft tissues and spinal canal: No  prevertebral fluid or swelling. No visible canal hematoma. Disc levels: Moderate loss of disc height at C5-C6 and C6-C7 with mild disc bulging and endplate spurring. No convincing disc herniation. Upper chest: No acute findings. Left upper lobe nodule, 11 x 7 mm, mean 9 mm. Advanced emphysema in the upper lobes. Other: None. IMPRESSION: HEAD CT 1. Ventricles are slightly larger than on the prior CT. This would support shunt dysfunction. 2. No other change or acute intracranial abnormality. 3. Moderate to advanced chronic microvascular ischemic change. CERVICAL CT 1. No fracture or acute finding. 2. 9 mm left upper lobe nodule. Patient is undergoing a chest CTA. Please refer to this study for further details. Electronically Signed   By: Lajean Manes M.D.   On: 09/06/2022 12:14   DG Abd 1 View  Result Date: 09/06/2022 CLINICAL DATA:  Pt daughter reports pt was scheduled for a ventriculoperitoneal shunt revision yesterday but the surgery was cancelled because she fell and hit her head on the same site as the shunt. Pt daughter reports she also hurt her right leg and then fell again this am around 4am and now has more severe pain in her right leg around her hip. Pt daughter reports she is concerned that she broke something. Pt daughter also reports pt has been increasingly SOB over the past week. Pt is on 2L of O2 at all times but gets winded and her sats drop  when she walks. No fever, cough or pain. EXAM: ABDOMEN - 1 VIEW COMPARISON:  07/25/2022 FINDINGS: Right sided ventriculoperitoneal shunt catheter extends into the pelvis, unchanged from the prior exam and intact. Normal bowel gas pattern. Scattered arterial vascular calcifications. Soft tissues otherwise unremarkable. Acute right femoral neck fracture as detailed under the right hip radiographs. IMPRESSION: 1. Intact visualized portion of the ventriculoperitoneal catheter. Electronically Signed   By: Lajean Manes M.D.   On: 09/06/2022 11:04   DG Cervical  Spine 1 View  Result Date: 09/06/2022 CLINICAL DATA:  Pt daughter reports pt was scheduled for a ventriculoperitoneal shunt revision yesterday but the surgery was cancelled because she fell and hit her head on the same site as the shunt. Pt daughter reports she also hurt her right leg and then fell again this am around 4am and now has more severe pain in her right leg around her hip. Pt daughter reports she is concerned that she broke something. Pt daughter also reports pt has been increasingly SOB over the past week. Pt is on 2L of O2 at all times but gets winded and her sats drop when she walks. No fever, cough or pain. EXAM: DG CERVICAL SPINE - 1 VIEW COMPARISON:  None Available. FINDINGS: Right sided ventriculostomy catheter is intact as it extends from the skull base to the upper chest. No fracture or acute finding. IMPRESSION: Intact visualized portion of the right neck VP shunt catheter. Electronically Signed   By: Lajean Manes M.D.   On: 09/06/2022 11:03   DG Skull 1-3 Views  Result Date: 09/06/2022 CLINICAL DATA:  Pt daughter reports pt was scheduled for a ventriculoperitoneal shunt revision yesterday but the surgery was cancelled because she fell and hit her head on the same site as the shunt. Pt daughter reports she also hurt her right leg and then fell again this am around 4am and now has more severe pain in her right leg around her hip. Pt daughter reports she is concerned that she broke something. Pt daughter also reports pt has been increasingly SOB over the past week. Pt is on 2L of O2 at all times but gets winded and her sats drop when she walks. No fever, cough or pain. EXAM: SKULL - 1-3 VIEW COMPARISON:  Head CT, 07/29/2022. FINDINGS: Right parietal shunt catheter, ventriculostomy projecting centrally, unchanged from the prior head CT. Visualized portion of the ventriculoperitoneal shunt is intact. No skull fracture or bone lesion. IMPRESSION: Stable appearance of the right ventriculostomy  catheter. Visualized portion the shunt catheter is intact. Electronically Signed   By: Lajean Manes M.D.   On: 09/06/2022 11:02   DG Chest 2 View  Result Date: 09/06/2022 CLINICAL DATA:  Pt daughter reports pt was scheduled for a ventriculoperitoneal shunt revision yesterday but the surgery was cancelled because she fell and hit her head on the same site as the shunt. Pt daughter reports she also hurt her right leg and then fell again this am around 4am and now has more severe pain in her right leg around her hip. Pt daughter reports she is concerned that she broke something. Pt daughter also reports pt has been increasingly SOB over the past week. Pt is on 2L of O2 at all times but gets winded and her sats drop when she walks. No fever, cough or pain. EXAM: CHEST - 2 VIEW COMPARISON:  07/24/2022.  CT, 08/27/2022. FINDINGS: Cardiac silhouette is normal in size. No mediastinal or hilar masses. Postsurgical changes with vascular clips  overlying the AP window/superior left hilum, stable. Lungs are hyperexpanded. There is relative volume loss on the left. Bilateral interstitial thickening, most evident in the lower lungs. Lucency in the upper lobes consistent with emphysema. No evidence of pneumonia or pulmonary edema. No pleural effusion or pneumothorax. Right anterior chest wall ventriculoperitoneal shunt catheter is intact. Skeletal structures are demineralized, grossly intact. IMPRESSION: 1. No acute cardiopulmonary disease. 2. Advanced COPD and chronic changes from prior left lung surgery. Electronically Signed   By: Lajean Manes M.D.   On: 09/06/2022 11:00   DG Hip Unilat W or Wo Pelvis 2-3 Views Right  Result Date: 09/06/2022 CLINICAL DATA:  Patient fell are yesterday and hit her head. She was scheduled for VP shunt revision. EXAM: DG HIP (WITH OR WITHOUT PELVIS) 2-3V RIGHT COMPARISON:  None Available. FINDINGS: Subcapital right femoral neck fracture, non comminuted, mildly displaced, distal fracture  component displacing superiorly by 1.5 cm, with Verus angulation. No other fractures.  No bone lesions. Hip joints, SI joints and pubic symphysis are normally aligned. Distal aspect of an intact ventriculoperitoneal shunt curls in the pelvis. IMPRESSION: 1. Mildly displaced, varus angulated subcapital right femoral neck fracture. No dislocation. Electronically Signed   By: Lajean Manes M.D.   On: 09/06/2022 10:59      Scheduled Meds:  [MAR Hold] amLODipine  2.5 mg Oral QHS   [MAR Hold] arformoterol  15 mcg Nebulization BID   And   [MAR Hold] umeclidinium bromide  1 puff Inhalation Daily   [MAR Hold] buPROPion  300 mg Oral Q breakfast   [MAR Hold] busPIRone  30 mg Oral QHS   [MAR Hold] busPIRone  45 mg Oral q AM   [MAR Hold] clonazePAM  0.5 mg Oral QHS   feeding supplement  237 mL Oral BID BM   [MAR Hold] folic acid  1 mg Oral Daily   [MAR Hold] insulin aspart  0-5 Units Subcutaneous QHS   [MAR Hold] insulin aspart  0-9 Units Subcutaneous TID WC   [MAR Hold] memantine  5 mg Oral BID   [START ON 09/08/2022] multivitamin with minerals  1 tablet Oral Q supper   [MAR Hold] rosuvastatin  20 mg Oral QHS   [MAR Hold] tamsulosin  0.4 mg Oral QPC supper   Continuous Infusions:   LOS: 1 day     Dessa Phi, DO [facility] 09/07/2022, 12:44 PM   Available via Epic secure chat 7am-7pm After these hours, please refer to coverage provider listed on [email or web address]

## 2022-09-07 NOTE — Op Note (Signed)
09/06/2022 - 09/07/2022  12:48 PM  PATIENT:  Amanda Porter   MRN: [id number]  PRE-OPERATIVE DIAGNOSIS:  Displaced Subcapital fracture right hip   POST-OPERATIVE DIAGNOSIS: Same  PROCEDURE:  Right hip hemiarthroplasty cemented  PREOPERATIVE INDICATIONS:  Amanda Porter is an 76 y.o. female who was admitted 09/06/2022 with a diagnosis of displaced subcapital fracture of the hip and elected for surgical management.  The risks benefits and alternatives were discussed with the patient including but not limited to the risks of nonoperative treatment, versus surgical intervention including infection, bleeding, nerve injury, periprosthetic fracture, the need for revision surgery, dislocation, leg length discrepancy, blood clots, cardiopulmonary complications, morbidity, mortality, among others, and they were willing to proceed.  Predicted outcome is good, although there will be at least a six to nine month expected recovery.     SURGEON:  Kurtis Bushman, MD  ASST:  none    ANESTHESIA: General    COMPLICATIONS:  None apparent   EBL:  100 cc    COMPONENTS:   Synergy cemented stem size 10,   and a bipolar head size 44 mm and a 28 mm by  +0 mm  femoral head    PROCEDURE IN DETAIL: The patient was met in the holding area and identified.  The appropriate hip  was marked at the operative site. The patient was then transported to the OR and  placed under general anesthesia.  At that point, the patient was  placed in the lateral decubitus position with the operative side up and  secured to the operating room table and all bony prominences padded.     The operative lower extremity was prepped from the iliac crest to the toes.  Sterile draping was performed.  Time out was performed prior to incision.      A routine posterolateral approach was utilized via sharp dissection  carried down to the subcutaneous tissue.  Gross bleeders were Bovie  coagulated.  The iliotibial band was identified and incised  along the  length of the skin incision.  Self-retaining retractors were  inserted.  With the hip internally rotated, the short external rotators  were identified. The piriformis was tagged and the hip capsule released in a T-type fashion.  The femoral neck was exposed, and I resected the femoral neck using the appropriate jig. This was performed at approximately a finger breadth above the lesser trochanter.    I then exposed the deep acetabulum, debrided bony and soft tissues including the ligamentum teres.    I then prepared the proximal femur using the cookie-cutter, the lateralizing reamer, and then sequentially reamed and broached.  A trial stem   was  utilized along with a bipolar head and neck.  I reduced the hip and it was found to have excellent stability with functional range of motion. Leg lengths were equal.  The trial components were then removed.   The same size femoral stem was then cemented in to place  The head and neck as trialed were inserted as well.    The hip was then reduced and taken through functional range of motion and found to have excellent stability. Leg lengths were restored.   I closed the T in the capsule with 5 mm Mersilene tape as well as the short external rotators.    I then irrigated the hip copiously again with pulse lavage, and repaired the fascia with #2 Quill and the subcutaneous layer with 2-0 Vicryl and staples for the skin. Sponge and needle counts  were correct. Xeroform and sterile Aquacell was applied. An abduction pillow was placed.  The patient was then awakened and returned to PACU in stable and satisfactory condition. There were no apparent complications.  Kurtis Bushman, MD  09/07/2022 12:48 PM

## 2022-09-07 NOTE — Consult Note (Signed)
ORTHOPAEDIC CONSULTATION  REQUESTING PHYSICIAN: Dessa Phi, DO  Chief Complaint: right hip pain  HPI: Amanda Porter is a 76 y.o. female who complains of right hip pain. The pain is sharp in character. The pain is severe and 10/10. The pain is worse with movement and better with rest. Denies any numbness, tingling or constitutional symptoms. Patient with mild dementia and history is primarily from her daughter and POA.  Past Medical History:  Diagnosis Date   Acute respiratory failure requiring reintubation ([place]) 11/05/2021   Anemia    Anxiety    Anxiety and depression    COPD (chronic obstructive pulmonary disease) ([facility])    CVA (cerebral vascular accident) ([place])    CVA (cerebral vascular accident) ([place])    L sided deficits   Dementia ([place])    Depression    Diabetes mellitus ([place])    Type 2   Diabetes mellitus without complication ([place])    Dysphagia    Dysphasia    GERD (gastroesophageal reflux disease)    History of lung cancer    Hyperlipidemia    Hypertension    Influenza A 12/13/2021   Memory disturbance 11/20/2017   Normal pressure hydrocephalus ([place]) 2018   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   Past Surgical History:  Procedure Laterality Date   IR FL GUIDED LOC OF NEEDLE/CATH TIP FOR SPINAL INJECTION RT  07/25/2022   LUNG REMOVAL, PARTIAL     left upper lobe   VENTRICULOPERITONEAL SHUNT  12/09/2016   VENTRICULOPERITONEAL SHUNT  10/2018   Social History   Socioeconomic History   Marital status: Divorced    Spouse name: Not on file   Number of children: 1   Years of education: trade school   Highest education level: Not on file  Occupational History   Occupation: Retired  Tobacco Use   Smoking status: Former    Packs/day: 0.50     Years: 50.00    Total pack years: 25.00    Types: Cigarettes    Quit date: 08/01/2017    Years since quitting: 5.1   Smokeless tobacco: Never  Vaping Use   Vaping Use: Never used  Substance and Sexual Activity   Alcohol use: Not Currently   Drug use: Not Currently   Sexual activity: Not Currently  Other Topics Concern   Not on file  Social History Narrative   ** Merged History Encounter **       Moved from [place]. Lives with daughter. Right handed    Social Determinants of Health   Financial Resource Strain: Not on file  Food Insecurity: No Food Insecurity (09/06/2022)   Hunger Vital Sign    Worried About Running Out of Food in the Last Year: Never true    Ran Out of Food in the Last Year: Never true  Transportation Needs: No Transportation Needs (09/06/2022)   PRAPARE - Hydrologist (Medical): No    Lack of Transportation (Non-Medical): No  Physical Activity: Not on file  Stress: Not on file  Social Connections: Not on file   Family History  Problem Relation Age of Onset   Pneumonia Mother    Cancer Father    Diabetes Sister    Diabetes Sister  Breast cancer Neg Hx    Allergies  Allergen Reactions   Lexapro [Escitalopram Oxalate] Other (See Comments)    Dizziness, nausea, fatigue   Neosporin  [Neomycin-Polymyxin-Gramicidin] Rash    (Neosporin)   Neosporin [Bacitracin-Polymyxin B] Rash   Prior to Admission medications   Medication Sig Start Date End Date Taking? Authorizing Provider  Accu-Chek Softclix Lancets lancets Use as instructed 04/04/22  Yes Dugal, Tabitha, FNP  acetaminophen (TYLENOL) 500 MG tablet Take 500-1,000 mg by mouth every 6 (six) hours as needed for mild pain or moderate pain.   Yes [provider]  albuterol (PROVENTIL) (2.5 MG/3ML) 0.083% nebulizer solution USE 1 VIAL IN NEBULIZER EVERY 6 HOURS Patient taking differently: Take 2.5 mg by nebulization every 6 (six) hours as needed for wheezing or shortness  of breath. 07/20/21  Yes Young, Clinton D, MD  albuterol (VENTOLIN HFA) 108 (90 Base) MCG/ACT inhaler INHALE 2 PUFFS BY MOUTH EVERY 4 HOURS AS NEEDED 07/11/22  Yes Young, Clinton D, MD  amLODipine (NORVASC) 2.5 MG tablet Take 1 tablet (2.5 mg total) by mouth daily. for blood pressure. Patient taking differently: Take 2.5 mg by mouth at bedtime. for blood pressure. 08/19/22  Yes Pleas Koch, NP  buPROPion (WELLBUTRIN XL) 300 MG 24 hr tablet Take 1 tablet (300 mg total) by mouth daily with breakfast. 08/02/22  Yes Nolberto Hanlon, MD  busPIRone (BUSPAR) 15 MG tablet Take 45 mg by mouth in the morning.   Yes [provider]  busPIRone (BUSPAR) 30 MG tablet Take 1 tablet (30 mg total) by mouth at bedtime. 08/02/22  Yes Nolberto Hanlon, MD  clonazePAM (KLONOPIN) 0.5 MG tablet Take 0.5 mg by mouth at bedtime.   Yes [provider]  conjugated estrogens (PREMARIN) vaginal cream Apply one pea-sized amount around the opening of the urethra daily for 2 weeks, then 3 times weekly moving forward. 08/20/22  Yes Vaillancourt, Aldona Bar, PA-C  folic acid (FOLVITE) 1 MG tablet Take 1 tablet (1 mg total) by mouth daily. 12/13/21  Yes Pleas Koch, NP  glipiZIDE (GLUCOTROL XL) 2.5 MG 24 hr tablet TAKE 1 TABLET (2.5 MG TOTAL) BY MOUTH DAILY WITH BREAKFAST. FOR DIABETES. 08/04/22  Yes Pleas Koch, NP  glucose blood test strip Use as instructed 04/04/22  Yes Dugal, Tabitha, FNP  memantine (NAMENDA) 5 MG tablet Take 1 tablet (5 mg total) by mouth 2 (two) times daily. For memory. 07/14/22  Yes Pleas Koch, NP  rosuvastatin (CRESTOR) 20 MG tablet TAKE 1 TABLET (20 MG TOTAL) BY MOUTH EVERY EVENING. FOR CHOLESTEROL. 07/13/22  Yes Pleas Koch, NP  tamsulosin (FLOMAX) 0.4 MG CAPS capsule Take 1 capsule (0.4 mg total) by mouth daily. For urine retention Patient taking differently: Take 0.4 mg by mouth daily after supper. For urine retention 12/13/21  Yes Pleas Koch, NP  Tiotropium Bromide  Monohydrate (SPIRIVA RESPIMAT) 1.25 MCG/ACT AERS Inhale 2 puffs into the lungs daily. Patient taking differently: Inhale 2 puffs into the lungs daily. If unable to pay for [facility] 07/01/22  Yes Young, Tarri Fuller D, MD  Tiotropium Bromide-Olodaterol (STIOLTO RESPIMAT) 2.5-2.5 MCG/ACT AERS INHALE 2 PUFFS BY MOUTH INTO THE LUNGS DAILY Patient taking differently: Inhale 2 puffs into the lungs daily. 12/26/21  Yes Baird Lyons D, MD   CT Angio Chest PE W and/or Wo Contrast  Result Date: 09/06/2022 CLINICAL DATA:  Worsening shortness of breath and hypoxia for 1 week. High probability for pulmonary embolism. EXAM: CT ANGIOGRAPHY CHEST WITH CONTRAST TECHNIQUE: Multidetector CT  imaging of the chest was performed using the standard protocol during bolus administration of intravenous contrast. Multiplanar CT image reconstructions and MIPs were obtained to evaluate the vascular anatomy. RADIATION DOSE REDUCTION: This exam was performed according to the departmental dose-optimization program which includes automated exposure control, adjustment of the mA and/or kV according to patient size and/or use of iterative reconstruction technique. CONTRAST:  4mL OMNIPAQUE IOHEXOL 350 MG/ML SOLN COMPARISON:  Noncontrast chest CT on 08/27/2022 from [facility], and 05/14/2022 from [facility] FINDINGS: Cardiovascular: Satisfactory opacification of pulmonary arteries noted, and no pulmonary emboli identified. No evidence of thoracic aortic dissection or aneurysm. Aortic and coronary atherosclerotic calcification incidentally noted. Moderate to severe left ventricular hypertrophy also noted. Mediastinum/Nodes: No masses or pathologically enlarged lymph nodes identified. Lungs/Pleura: Moderate centrilobular emphysema again noted. Postop changes are again seen from prior left upper lobe resection. A solid nodule is again seen in the anterior apical region of the left lung which measures 12 x 7 mm on image 36/5.  This remains stable compared to previous studies. Few other tiny less than 5 mm pulmonary nodules in the peripheral left lung also remain stable. Upper abdomen: Cholelithiasis again noted. Musculoskeletal: No suspicious bone lesions identified. Review of the MIP images confirms the above findings. IMPRESSION: No evidence of pulmonary embolism. Stable 12 mm solid nodule in the anterior apical region of the left lung. Consider continued follow-up by chest CT in 6 months, or further characterization with PET-CT. Cholelithiasis incidentally noted. Aortic Atherosclerosis (ICD10-I70.0) and Emphysema (ICD10-J43.9). Electronically Signed   By: Marlaine Hind M.D.   On: 09/06/2022 12:24   CT HEAD WO CONTRAST (5MM)  Result Date: 09/06/2022 CLINICAL DATA:  Four VP shunt revision yesterday. Procedure canceled because patient fell. Patient has a right hip fracture. EXAM: CT HEAD WITHOUT CONTRAST CT CERVICAL SPINE WITHOUT CONTRAST TECHNIQUE: Multidetector CT imaging of the head and cervical spine was performed following the standard protocol without intravenous contrast. Multiplanar CT image reconstructions of the cervical spine were also generated. RADIATION DOSE REDUCTION: This exam was performed according to the departmental dose-optimization program which includes automated exposure control, adjustment of the mA and/or kV according to patient size and/or use of iterative reconstruction technique. COMPARISON:  07/29/2022. FINDINGS: CT HEAD FINDINGS Brain: No evidence of acute infarction, hemorrhage, extra-axial collection or mass lesion/mass effect. There is ventricular enlargement that has slightly increased compared to the prior CT. Right frontal ventriculostomy catheter tip projects adjacent to the septum pellucidum, unchanged. Patchy bilateral white matter hypoattenuation is noted consistent with moderate to advanced chronic microvascular ischemic change. Old right thalamic lacunar infarct. Vascular: No hyperdense vessel  or unexpected calcification. Skull: Normal. Negative for fracture or focal lesion. Sinuses/Orbits: Globes and orbits are unremarkable. Visualized sinuses are clear. Other: None. CT CERVICAL SPINE FINDINGS Alignment: Mild reversal the normal cervical lordosis, apex at C6. No spondylolisthesis. Skull base and vertebrae: No acute fracture. No primary bone lesion or focal pathologic process. Soft tissues and spinal canal: No prevertebral fluid or swelling. No visible canal hematoma. Disc levels: Moderate loss of disc height at C5-C6 and C6-C7 with mild disc bulging and endplate spurring. No convincing disc herniation. Upper chest: No acute findings. Left upper lobe nodule, 11 x 7 mm, mean 9 mm. Advanced emphysema in the upper lobes. Other: None. IMPRESSION: HEAD CT 1. Ventricles are slightly larger than on the prior CT. This would support shunt dysfunction. 2. No other change or acute intracranial abnormality. 3. Moderate to advanced chronic microvascular ischemic  change. CERVICAL CT 1. No fracture or acute finding. 2. 9 mm left upper lobe nodule. Patient is undergoing a chest CTA. Please refer to this study for further details. Electronically Signed   By: Lajean Manes M.D.   On: 09/06/2022 12:14   CT Cervical Spine Wo Contrast  Result Date: 09/06/2022 CLINICAL DATA:  Four VP shunt revision yesterday. Procedure canceled because patient fell. Patient has a right hip fracture. EXAM: CT HEAD WITHOUT CONTRAST CT CERVICAL SPINE WITHOUT CONTRAST TECHNIQUE: Multidetector CT imaging of the head and cervical spine was performed following the standard protocol without intravenous contrast. Multiplanar CT image reconstructions of the cervical spine were also generated. RADIATION DOSE REDUCTION: This exam was performed according to the departmental dose-optimization program which includes automated exposure control, adjustment of the mA and/or kV according to patient size and/or use of iterative reconstruction technique.  COMPARISON:  07/29/2022. FINDINGS: CT HEAD FINDINGS Brain: No evidence of acute infarction, hemorrhage, extra-axial collection or mass lesion/mass effect. There is ventricular enlargement that has slightly increased compared to the prior CT. Right frontal ventriculostomy catheter tip projects adjacent to the septum pellucidum, unchanged. Patchy bilateral white matter hypoattenuation is noted consistent with moderate to advanced chronic microvascular ischemic change. Old right thalamic lacunar infarct. Vascular: No hyperdense vessel or unexpected calcification. Skull: Normal. Negative for fracture or focal lesion. Sinuses/Orbits: Globes and orbits are unremarkable. Visualized sinuses are clear. Other: None. CT CERVICAL SPINE FINDINGS Alignment: Mild reversal the normal cervical lordosis, apex at C6. No spondylolisthesis. Skull base and vertebrae: No acute fracture. No primary bone lesion or focal pathologic process. Soft tissues and spinal canal: No prevertebral fluid or swelling. No visible canal hematoma. Disc levels: Moderate loss of disc height at C5-C6 and C6-C7 with mild disc bulging and endplate spurring. No convincing disc herniation. Upper chest: No acute findings. Left upper lobe nodule, 11 x 7 mm, mean 9 mm. Advanced emphysema in the upper lobes. Other: None. IMPRESSION: HEAD CT 1. Ventricles are slightly larger than on the prior CT. This would support shunt dysfunction. 2. No other change or acute intracranial abnormality. 3. Moderate to advanced chronic microvascular ischemic change. CERVICAL CT 1. No fracture or acute finding. 2. 9 mm left upper lobe nodule. Patient is undergoing a chest CTA. Please refer to this study for further details. Electronically Signed   By: Lajean Manes M.D.   On: 09/06/2022 12:14   DG Abd 1 View  Result Date: 09/06/2022 CLINICAL DATA:  Pt daughter reports pt was scheduled for a ventriculoperitoneal shunt revision yesterday but the surgery was cancelled because she fell  and hit her head on the same site as the shunt. Pt daughter reports she also hurt her right leg and then fell again this am around 4am and now has more severe pain in her right leg around her hip. Pt daughter reports she is concerned that she broke something. Pt daughter also reports pt has been increasingly SOB over the past week. Pt is on 2L of O2 at all times but gets winded and her sats drop when she walks. No fever, cough or pain. EXAM: ABDOMEN - 1 VIEW COMPARISON:  07/25/2022 FINDINGS: Right sided ventriculoperitoneal shunt catheter extends into the pelvis, unchanged from the prior exam and intact. Normal bowel gas pattern. Scattered arterial vascular calcifications. Soft tissues otherwise unremarkable. Acute right femoral neck fracture as detailed under the right hip radiographs. IMPRESSION: 1. Intact visualized portion of the ventriculoperitoneal catheter. Electronically Signed   By: Dedra Skeens.D.  On: 09/06/2022 11:04   DG Cervical Spine 1 View  Result Date: 09/06/2022 CLINICAL DATA:  Pt daughter reports pt was scheduled for a ventriculoperitoneal shunt revision yesterday but the surgery was cancelled because she fell and hit her head on the same site as the shunt. Pt daughter reports she also hurt her right leg and then fell again this am around 4am and now has more severe pain in her right leg around her hip. Pt daughter reports she is concerned that she broke something. Pt daughter also reports pt has been increasingly SOB over the past week. Pt is on 2L of O2 at all times but gets winded and her sats drop when she walks. No fever, cough or pain. EXAM: DG CERVICAL SPINE - 1 VIEW COMPARISON:  None Available. FINDINGS: Right sided ventriculostomy catheter is intact as it extends from the skull base to the upper chest. No fracture or acute finding. IMPRESSION: Intact visualized portion of the right neck VP shunt catheter. Electronically Signed   By: Lajean Manes M.D.   On: 09/06/2022 11:03   DG  Skull 1-3 Views  Result Date: 09/06/2022 CLINICAL DATA:  Pt daughter reports pt was scheduled for a ventriculoperitoneal shunt revision yesterday but the surgery was cancelled because she fell and hit her head on the same site as the shunt. Pt daughter reports she also hurt her right leg and then fell again this am around 4am and now has more severe pain in her right leg around her hip. Pt daughter reports she is concerned that she broke something. Pt daughter also reports pt has been increasingly SOB over the past week. Pt is on 2L of O2 at all times but gets winded and her sats drop when she walks. No fever, cough or pain. EXAM: SKULL - 1-3 VIEW COMPARISON:  Head CT, 07/29/2022. FINDINGS: Right parietal shunt catheter, ventriculostomy projecting centrally, unchanged from the prior head CT. Visualized portion of the ventriculoperitoneal shunt is intact. No skull fracture or bone lesion. IMPRESSION: Stable appearance of the right ventriculostomy catheter. Visualized portion the shunt catheter is intact. Electronically Signed   By: Lajean Manes M.D.   On: 09/06/2022 11:02   DG Chest 2 View  Result Date: 09/06/2022 CLINICAL DATA:  Pt daughter reports pt was scheduled for a ventriculoperitoneal shunt revision yesterday but the surgery was cancelled because she fell and hit her head on the same site as the shunt. Pt daughter reports she also hurt her right leg and then fell again this am around 4am and now has more severe pain in her right leg around her hip. Pt daughter reports she is concerned that she broke something. Pt daughter also reports pt has been increasingly SOB over the past week. Pt is on 2L of O2 at all times but gets winded and her sats drop when she walks. No fever, cough or pain. EXAM: CHEST - 2 VIEW COMPARISON:  07/24/2022.  CT, 08/27/2022. FINDINGS: Cardiac silhouette is normal in size. No mediastinal or hilar masses. Postsurgical changes with vascular clips overlying the AP window/superior  left hilum, stable. Lungs are hyperexpanded. There is relative volume loss on the left. Bilateral interstitial thickening, most evident in the lower lungs. Lucency in the upper lobes consistent with emphysema. No evidence of pneumonia or pulmonary edema. No pleural effusion or pneumothorax. Right anterior chest wall ventriculoperitoneal shunt catheter is intact. Skeletal structures are demineralized, grossly intact. IMPRESSION: 1. No acute cardiopulmonary disease. 2. Advanced COPD and chronic changes from prior left  lung surgery. Electronically Signed   By: Lajean Manes M.D.   On: 09/06/2022 11:00   DG Hip Unilat W or Wo Pelvis 2-3 Views Right  Result Date: 09/06/2022 CLINICAL DATA:  Patient fell are yesterday and hit her head. She was scheduled for VP shunt revision. EXAM: DG HIP (WITH OR WITHOUT PELVIS) 2-3V RIGHT COMPARISON:  None Available. FINDINGS: Subcapital right femoral neck fracture, non comminuted, mildly displaced, distal fracture component displacing superiorly by 1.5 cm, with Verus angulation. No other fractures.  No bone lesions. Hip joints, SI joints and pubic symphysis are normally aligned. Distal aspect of an intact ventriculoperitoneal shunt curls in the pelvis. IMPRESSION: 1. Mildly displaced, varus angulated subcapital right femoral neck fracture. No dislocation. Electronically Signed   By: Lajean Manes M.D.   On: 09/06/2022 10:59    Positive ROS: All other systems have been reviewed and were otherwise negative with the exception of those mentioned in the HPI and as above.  Physical Exam: General: Alert, no acute distress Cardiovascular: No pedal edema Respiratory: No cyanosis, no use of accessory musculature GI: No organomegaly, abdomen is soft and non-tender Skin: No lesions in the area of chief complaint Neurologic: Sensation intact distally Psychiatric: Patient is competent for consent with normal mood and affect Lymphatic: No axillary or cervical  lymphadenopathy  MUSCULOSKELETAL: right leg short, externally rotated. Compartments soft. Good cap refill. Motor and sensory intact distally.  Assessment: Right femoral neck fracture, closed, displaced   Plan: Right hip hemiarthroplasty.   The diagnosis, risks, benefits and alternatives to treatment are all discussed in detail with the patient and family. Risks include but are not limited to bleeding, infection, deep vein thrombosis, pulmonary embolism, nerve or vascular injury, non-union, repeat operation, persistent pain, weakness, stiffness and death. She understands and is eager to proceed.     Lovell Sheehan, MD    09/07/2022 9:58 AM

## 2022-09-07 NOTE — Transfer of Care (Signed)
Immediate Anesthesia Transfer of Care Note  Patient: Amanda Porter  Procedure(s) Performed: ARTHROPLASTY BIPOLAR HIP (HEMIARTHROPLASTY) (Right: Hip)  Patient Location: PACU  Anesthesia Type:General  Level of Consciousness: drowsy and patient cooperative  Airway & Oxygen Therapy: Patient Spontanous Breathing and Patient connected to face mask oxygen  Post-op Assessment: Report given to RN and Post -op Vital signs reviewed and stable  Post vital signs: Reviewed and stable  Last Vitals:  Vitals Value Taken Time  BP 120/60 09/07/22 1245  Temp 36.5 C 09/07/22 1243  Pulse 103 09/07/22 1249  Resp 17 09/07/22 1249  SpO2 91 % 09/07/22 1249  Vitals shown include unvalidated device data.  Last Pain:  Vitals:   09/07/22 1243  TempSrc:   PainSc: Asleep         Complications: No notable events documented.

## 2022-09-07 NOTE — Plan of Care (Signed)
  Problem: Skin Integrity: Goal: Risk for impaired skin integrity will decrease Outcome: Progressing   Problem: Health Behavior/Discharge Planning: Goal: Ability to manage health-related needs will improve Outcome: Progressing   Problem: Clinical Measurements: Goal: Will remain free from infection Outcome: Progressing   Problem: Activity: Goal: Risk for activity intolerance will decrease Outcome: Progressing   Problem: Nutrition: Goal: Adequate nutrition will be maintained Outcome: Progressing   Problem: Skin Integrity: Goal: Risk for impaired skin integrity will decrease Outcome: Progressing

## 2022-09-07 NOTE — TOC Initial Note (Addendum)
Transition of Care [facility]) - Initial/Assessment Note    Patient Details  Name: Amanda Porter MRN: [id number] Date of Birth: [birth year]  Transition of Care [facility]) CM/SW Contact:    [address], [place] Phone Number: 09/07/2022, 10:28 AM  Clinical Narrative:                  CSW spoke with pt's daughter [name], also pt's POA. [name] states pt lives with her, lives on the first floor of the home. [name] states she provides most of pt's care when needed, pt does not drive. [name] takes pt to appts. Currently, pt uses a rollator and home O2, [name] states pt will need a bedside commode, wheelchair, shower chair and walker before dc. [name] states pt currently has [employer] with [facility] but believes pt will ned SNF at dc then transition back to [facility] at home. [name] reports a preference for [facility], [facility] or [facility] (last choice) for SNF placement if recommended by PT. [name] confirms PCP is Alma Friendly, NP and [facility] [address].        Patient Goals and CMS Choice        Expected Discharge Plan and Services                                                Prior Living Arrangements/Services                       Activities of Daily Living Home Assistive Devices/Equipment: Gilford Rile (specify type) ADL Screening (condition at time of admission) Patient's cognitive ability adequate to safely complete daily activities?: Yes Is the patient deaf or have difficulty hearing?: No Does the patient have difficulty seeing, even when wearing glasses/contacts?: No Does the patient have difficulty concentrating, remembering, or making decisions?: No Patient able to express need for assistance with ADLs?: Yes Does the patient have difficulty dressing or bathing?: Yes Independently performs ADLs?: No Communication: Independent Dressing (OT): Independent Grooming: Appropriate for developmental age Feeding: Independent Bathing: Needs  assistance Toileting: Independent with device (comment) Walks in Home: Independent with device (comment) (walker) Does the patient have difficulty walking or climbing stairs?: Yes Weakness of Legs: None Weakness of Arms/Hands: Left  Permission Sought/Granted                  Emotional Assessment              Admission diagnosis:  Hypokalemia [E87.6] Right femoral fracture ([place]) [S72.91XA] Fall, initial encounter [W19.XXXA] Closed displaced fracture of right femoral neck ([place]) [S72.001A] Patient Active Problem List   Diagnosis Date Noted   Closed displaced fracture of right femoral neck ([place]) 09/06/2022   Preoperative clearance 08/18/2022   Multiple lung nodules 08/03/2022   Acute urinary retention 08/01/2022   Normocytic anemia 07/29/2022   NPH (normal pressure hydrocephalus) ([place]) 07/25/2022   Recurrent UTI 06/24/2022   Recurrent falls 06/24/2022   DNR (do not resuscitate) 06/24/2022   S/P VP shunt 04/22/2022   Subdural hematoma ([place]) 04/16/2022   Pleural effusion on left 04/08/2022   Decreased urine output 04/04/2022   Vitamin D deficiency 04/04/2022   Other fatigue 04/04/2022   Hypokalemia 04/04/2022   Urinary retention 12/13/2021   Wrist fracture, closed, left, sequela 12/13/2021   Protein-calorie malnutrition, severe 11/07/2021   Tardive dyskinesia 02/01/2020   Cervical  spondylolysis 05/24/2019   Chronic respiratory failure with hypoxia ([place]) 02/25/2019   Postural dizziness 08/11/2018   Iron deficiency anemia 07/02/2018   Anemia 04/12/2018   Type 2 diabetes mellitus with hyperglycemia ([place]) 02/18/2018   Pain of left lower extremity 11/26/2017   Memory disturbance 11/20/2017   Right carotid bruit 11/20/2017   Hyperlipidemia 09/14/2017   COPD mixed type ([place]) 09/14/2017   Dementia without behavioral disturbance ([place]) 09/14/2017   Dysphagia 09/14/2017   Essential hypertension 09/14/2017   History of CVA (cerebrovascular accident) 09/14/2017   Anxiety  and depression 09/14/2017   Tobacco use disorder 12/10/2016   PCP:  Pleas Koch, NP Pharmacy:   [id number] [place], [place] [address] [place] [address] Phone: [phone] Fax: [phone]     Social Determinants of Health (SDOH) Interventions    Readmission Risk Interventions     No data to display

## 2022-09-07 NOTE — Anesthesia Postprocedure Evaluation (Signed)
Anesthesia Post Note  Patient: Amanda Porter  Procedure(s) Performed: ARTHROPLASTY BIPOLAR HIP (HEMIARTHROPLASTY) (Right: Hip)  Patient location during evaluation: PACU Anesthesia Type: General Level of consciousness: awake and alert Pain management: pain level controlled Vital Signs Assessment: post-procedure vital signs reviewed and stable Respiratory status: spontaneous breathing, nonlabored ventilation, respiratory function stable and patient connected to nasal cannula oxygen Cardiovascular status: blood pressure returned to baseline and stable Postop Assessment: no apparent nausea or vomiting Anesthetic complications: no   No notable events documented.   Last Vitals:  Vitals:   09/07/22 1345 09/07/22 1400  BP: (!) 109/59 (!) 107/59  Pulse: (!) 107 (!) 105  Resp: 11 12  Temp: 36.7 C   SpO2: 90% 96%    Last Pain:  Vitals:   09/07/22 1330  TempSrc:   PainSc: 0-No pain                 Dimas Millin

## 2022-09-07 NOTE — Progress Notes (Signed)
Initial Nutrition Assessment  DOCUMENTATION CODES:   Not applicable  INTERVENTION:   Multivitamin w/ minerals daily Ensure Enlive po BID, each supplement provides 350 kcal and 20 grams of protein.  NUTRITION DIAGNOSIS:   Increased nutrient needs related to hip fracture as evidenced by estimated needs  GOAL:   Patient will meet greater than or equal to 90% of their needs  MONITOR:   PO intake, Supplement acceptance, Labs, Weight trends  REASON FOR ASSESSMENT:   Consult Assessment of nutrition requirement/status  ASSESSMENT:   76 y.o. female presented to the ED with frequents falls and R hip pain. PMH include COPD, CVA, HTN, T2DM, HLD, dementia, and dysphagia. Pt admitted with R femoral fracture and hypokalemia.   RD working remotely at time of assessment. Pt in OR during assessment as well. RD to order pt nutritional supplements for pt to assist in post op healing.  Per EMR, pt has had a 6% weight loss within 3 months.   Medications reviewed and include: Folic acid, NovoLog SSI, Labs reviewed.  NUTRITION - FOCUSED PHYSICAL EXAM:  Deferred to follow-up.   Diet Order:   Diet Order             Diet NPO time specified Except for: Sips with Meds  Diet effective midnight                   EDUCATION NEEDS:   No education needs have been identified at this time  Skin:  Skin Assessment: Reviewed RN Assessment  Last BM:  10/6  Height:   Ht Readings from Last 1 Encounters:  09/06/22 5\' 2"  (1.575 m)    Weight:   Wt Readings from Last 1 Encounters:  09/06/22 47 kg    Ideal Body Weight:  50 kg  BMI:  Body mass index is 18.95 kg/m.  Estimated Nutritional Needs:   Kcal:  1400-1600  Protein:  70-85 grams  Fluid:  >/= 1.5 L    Amanda Porter RD, LDN Clinical Dietitian See [facility] for contact information.

## 2022-09-08 ENCOUNTER — Encounter: Payer: Self-pay | Admitting: Orthopedic Surgery

## 2022-09-08 ENCOUNTER — Telehealth: Payer: Self-pay

## 2022-09-08 ENCOUNTER — Ambulatory Visit: Payer: Medicare PPO | Admitting: Internal Medicine

## 2022-09-08 ENCOUNTER — Inpatient Hospital Stay: Payer: Medicare PPO

## 2022-09-08 DIAGNOSIS — Z8673 Personal history of transient ischemic attack (TIA), and cerebral infarction without residual deficits: Secondary | ICD-10-CM | POA: Diagnosis not present

## 2022-09-08 DIAGNOSIS — Z982 Presence of cerebrospinal fluid drainage device: Secondary | ICD-10-CM

## 2022-09-08 DIAGNOSIS — R4189 Other symptoms and signs involving cognitive functions and awareness: Secondary | ICD-10-CM

## 2022-09-08 DIAGNOSIS — I10 Essential (primary) hypertension: Secondary | ICD-10-CM

## 2022-09-08 DIAGNOSIS — R4701 Aphasia: Secondary | ICD-10-CM

## 2022-09-08 DIAGNOSIS — G912 (Idiopathic) normal pressure hydrocephalus: Secondary | ICD-10-CM

## 2022-09-08 DIAGNOSIS — F0390 Unspecified dementia without behavioral disturbance: Secondary | ICD-10-CM | POA: Diagnosis not present

## 2022-09-08 DIAGNOSIS — S72001A Fracture of unspecified part of neck of right femur, initial encounter for closed fracture: Secondary | ICD-10-CM | POA: Diagnosis not present

## 2022-09-08 DIAGNOSIS — R413 Other amnesia: Secondary | ICD-10-CM

## 2022-09-08 DIAGNOSIS — E785 Hyperlipidemia, unspecified: Secondary | ICD-10-CM

## 2022-09-08 LAB — GLUCOSE, CAPILLARY
Glucose-Capillary: 157 mg/dL — ABNORMAL HIGH (ref 70–99)
Glucose-Capillary: 245 mg/dL — ABNORMAL HIGH (ref 70–99)
Glucose-Capillary: 204 mg/dL — ABNORMAL HIGH (ref 70–99)
Glucose-Capillary: 161 mg/dL — ABNORMAL HIGH (ref 70–99)

## 2022-09-08 LAB — CBC
HCT: 26.8 % — ABNORMAL LOW (ref 36.0–46.0)
Hemoglobin: 8.1 g/dL — ABNORMAL LOW (ref 12.0–15.0)
MCH: 27.6 pg (ref 26.0–34.0)
MCHC: 30.2 g/dL (ref 30.0–36.0)
MCV: 91.2 fL (ref 80.0–100.0)
Platelets: 309 10*3/uL (ref 150–400)
RBC: 2.94 MIL/uL — ABNORMAL LOW (ref 3.87–5.11)
RDW: 15 % (ref 11.5–15.5)
WBC: 12.7 10*3/uL — ABNORMAL HIGH (ref 4.0–10.5)
nRBC: 0 % (ref 0.0–0.2)

## 2022-09-08 MED ORDER — ASPIRIN 81 MG PO CHEW
81.0000 mg | CHEWABLE_TABLET | Freq: Every day | ORAL | Status: DC
  Administered 2022-09-08 – 2022-09-12 (×5): 81 mg via ORAL
  Filled 2022-09-08 (×5): qty 1

## 2022-09-08 MED ORDER — IPRATROPIUM-ALBUTEROL 0.5-2.5 (3) MG/3ML IN SOLN
3.0000 mL | RESPIRATORY_TRACT | Status: DC | PRN
  Administered 2022-09-09: 3 mL via RESPIRATORY_TRACT
  Filled 2022-09-08: qty 3

## 2022-09-08 NOTE — Progress Notes (Signed)
  Subjective:  Patient reports pain as mild.  Follows commands. Events noted.  Objective:   VITALS:   Vitals:   09/07/22 2027 09/08/22 0711 09/08/22 0749 09/08/22 1210  BP:   (!) 118/59 (!) 116/53  Pulse:   97 89  Resp:      Temp:   98.1 F (36.7 C) 98.5 F (36.9 C)  TempSrc:      SpO2: 92% 96% 94% 96%  Weight:      Height:        PHYSICAL EXAM:  Sensation intact distally Dorsiflexion/Plantar flexion intact Incision: dressing C/D/I No cellulitis present Compartment soft  LABS  Results for orders placed or performed during the hospital encounter of 09/06/22 (from the past 24 hour(s))  Glucose, capillary     Status: None   Collection Time: 09/07/22 12:54 PM  Result Value Ref Range   Glucose-Capillary 87 70 - 99 mg/dL  Glucose, capillary     Status: Abnormal   Collection Time: 09/07/22  5:26 PM  Result Value Ref Range   Glucose-Capillary 105 (H) 70 - 99 mg/dL  CBC     Status: Abnormal   Collection Time: 09/08/22  4:53 AM  Result Value Ref Range   WBC 12.7 (H) 4.0 - 10.5 K/uL   RBC 2.94 (L) 3.87 - 5.11 MIL/uL   Hemoglobin 8.1 (L) 12.0 - 15.0 g/dL   HCT 26.8 (L) 36.0 - 46.0 %   MCV 91.2 80.0 - 100.0 fL   MCH 27.6 26.0 - 34.0 pg   MCHC 30.2 30.0 - 36.0 g/dL   RDW 15.0 11.5 - 15.5 %   Platelets 309 150 - 400 K/uL   nRBC 0.0 0.0 - 0.2 %  Glucose, capillary     Status: Abnormal   Collection Time: 09/08/22  9:07 AM  Result Value Ref Range   Glucose-Capillary 157 (H) 70 - 99 mg/dL  Glucose, capillary     Status: Abnormal   Collection Time: 09/08/22 12:14 PM  Result Value Ref Range   Glucose-Capillary 245 (H) 70 - 99 mg/dL    DG Pelvis 1-2 Views  Result Date: 09/07/2022 CLINICAL DATA:  Postop right hip radiographs. EXAM: PELVIS - 1-2 VIEW COMPARISON:  09/06/2022. FINDINGS: Single AP view shows a right hip hemiarthroplasty, which appears well seated and aligned. No acute fracture or evidence of an operative complication. IMPRESSION: Well-positioned right hip  hemiarthroplasty. Electronically Signed   By: Lajean Manes M.D.   On: 09/07/2022 14:14    Assessment/Plan: 1 Day Post-Op   Principal Problem:   Closed displaced fracture of right femoral neck (HCC) Active Problems:   Hyperlipidemia   Dementia without behavioral disturbance (HCC)   Dysphagia   Essential hypertension   History of CVA (cerebrovascular accident)   Anxiety and depression   Memory disturbance   Type 2 diabetes mellitus with hyperglycemia (HCC)   Hypokalemia   S/P VP shunt   DNR (do not resuscitate)   NPH (normal pressure hydrocephalus) ([place])   Multiple lung nodules   Up with therapy Discharge planning   Lovell Sheehan , MD 09/08/2022, 12:25 PM

## 2022-09-08 NOTE — NC FL2 (Signed)
[place] LEVEL OF CARE SCREENING TOOL     IDENTIFICATION  Patient Name: Amanda Porter Birthdate: 16-Sep-1946 Sex: female Admission Date (Current Location): 09/06/2022  [facility] and [place] Number:  Herbalist and Address:  [facility], [address], [place], [place] [address]      Provider Number: [id number]  Attending Physician Name and Address:  Dessa Phi, DO  Relative Name and Phone Number:  Tameia Rafferty- daughter- [phone]    Current Level of Care: Hospital Recommended Level of Care: [place] Prior Approval Number:    Date Approved/Denied:   PASRR Number: [id number]  Discharge Plan: SNF    Current Diagnoses: Patient Active Problem List   Diagnosis Date Noted   Closed displaced fracture of right femoral neck ([place]) 09/06/2022   Preoperative clearance 08/18/2022   Multiple lung nodules 08/03/2022   Acute urinary retention 08/01/2022   Normocytic anemia 07/29/2022   NPH (normal pressure hydrocephalus) ([place]) 07/25/2022   Recurrent UTI 06/24/2022   Recurrent falls 06/24/2022   DNR (do not resuscitate) 06/24/2022   S/P VP shunt 04/22/2022   Subdural hematoma ([place]) 04/16/2022   Pleural effusion on left 04/08/2022   Decreased urine output 04/04/2022   Vitamin D deficiency 04/04/2022   Other fatigue 04/04/2022   Hypokalemia 04/04/2022   Urinary retention 12/13/2021   Wrist fracture, closed, left, sequela 12/13/2021   Protein-calorie malnutrition, severe 11/07/2021   Tardive dyskinesia 02/01/2020   Cervical spondylolysis 05/24/2019   Chronic respiratory failure with hypoxia ([place]) 02/25/2019   Postural dizziness 08/11/2018   Iron deficiency anemia 07/02/2018   Anemia 04/12/2018   Type 2 diabetes mellitus with hyperglycemia ([place]) 02/18/2018   Pain of left lower extremity 11/26/2017   Memory disturbance 11/20/2017   Right carotid bruit 11/20/2017   Hyperlipidemia 09/14/2017   COPD mixed type  ([place]) 09/14/2017   Dementia without behavioral disturbance ([place]) 09/14/2017   Dysphagia 09/14/2017   Essential hypertension 09/14/2017   History of CVA (cerebrovascular accident) 09/14/2017   Anxiety and depression 09/14/2017   Tobacco use disorder 12/10/2016    Orientation RESPIRATION BLADDER Height & Weight     Situation, Place, Self  Normal Indwelling catheter Weight: 103 lb 9.9 oz (47 kg) Height:  5\' 2"  (157.5 cm)  BEHAVIORAL SYMPTOMS/MOOD NEUROLOGICAL BOWEL NUTRITION STATUS  Other (Comment) (none) Convulsions/Seizures (hx normal pressure hydrocephalus, s/p shunt placement and shunt malfunction, hx of CVA, dementia) Continent Diet (carb modified)  AMBULATORY STATUS COMMUNICATION OF NEEDS Skin   Total Care Verbally Normal                       Personal Care Assistance Level of Assistance  Bathing, Feeding, Dressing Bathing Assistance: Maximum assistance Feeding assistance: Limited assistance Dressing Assistance: Maximum assistance     Functional Limitations Info  Sight, Hearing, Speech Sight Info: Impaired Hearing Info: Adequate Speech Info: Adequate    SPECIAL CARE FACTORS FREQUENCY  PT (By licensed PT), OT (By licensed OT)     PT Frequency: 5x a week OT Frequency: 5x a week            Contractures Contractures Info: Not present    Additional Factors Info  Code Status, Allergies, Psychotropic Code Status Info: DNR Allergies Info: Neosporin Psychotropic Info: clonazePAM         Current Medications (09/08/2022):  This is the current hospital active medication list Current Facility-Administered Medications  Medication Dose Route Frequency Provider Last Rate Last Admin   acetaminophen (  TYLENOL) tablet 325-650 mg  325-650 mg Oral Q6H PRN Lovell Sheehan, MD       albuterol (PROVENTIL) (2.5 MG/3ML) 0.083% nebulizer solution 2.5 mg  2.5 mg Nebulization Q6H PRN Lovell Sheehan, MD   2.5 mg at 09/08/22 0708   amLODipine (NORVASC) tablet 2.5 mg  2.5 mg Oral  QHS Lovell Sheehan, MD   2.5 mg at 09/07/22 2240   arformoterol (BROVANA) nebulizer solution 15 mcg  15 mcg Nebulization BID Lovell Sheehan, MD   15 mcg at 09/08/22 1610   And   umeclidinium bromide (INCRUSE ELLIPTA) 62.5 MCG/ACT 1 puff  1 puff Inhalation Daily Lovell Sheehan, MD   1 puff at 09/08/22 0937   bisacodyl (DULCOLAX) EC tablet 5 mg  5 mg Oral Daily PRN Lovell Sheehan, MD       buPROPion (WELLBUTRIN XL) 24 hr tablet 300 mg  300 mg Oral Q breakfast Lovell Sheehan, MD   300 mg at 09/08/22 9604   busPIRone (BUSPAR) tablet 30 mg  30 mg Oral QHS Lovell Sheehan, MD   30 mg at 09/07/22 2241   busPIRone (BUSPAR) tablet 45 mg  45 mg Oral q AM Lovell Sheehan, MD   45 mg at 09/08/22 5409   clonazePAM (KLONOPIN) tablet 0.5 mg  0.5 mg Oral QHS Lovell Sheehan, MD   0.5 mg at 09/07/22 2241   enoxaparin (LOVENOX) injection 40 mg  40 mg Subcutaneous Q24H Lovell Sheehan, MD   40 mg at 09/08/22 8119   feeding supplement (ENSURE ENLIVE / ENSURE PLUS) liquid 237 mL  237 mL Oral BID BM Lovell Sheehan, MD   237 mL at 09/08/22 1324   fentaNYL (SUBLIMAZE) injection 25 mcg  25 mcg Intravenous Q2H PRN Lovell Sheehan, MD       folic acid (FOLVITE) tablet 1 mg  1 mg Oral Daily Lovell Sheehan, MD   1 mg at 09/08/22 1478   hydrALAZINE (APRESOLINE) tablet 10 mg  10 mg Oral Q8H PRN Lovell Sheehan, MD       HYDROcodone-acetaminophen (NORCO) 7.5-325 MG per tablet 1-2 tablet  1-2 tablet Oral Q4H PRN Lovell Sheehan, MD       HYDROcodone-acetaminophen (NORCO/VICODIN) 5-325 MG per tablet 1-2 tablet  1-2 tablet Oral Q4H PRN Lovell Sheehan, MD   2 tablet at 09/07/22 2242   insulin aspart (novoLOG) injection 0-5 Units  0-5 Units Subcutaneous QHS Lovell Sheehan, MD       insulin aspart (novoLOG) injection 0-9 Units  0-9 Units Subcutaneous TID WC Lovell Sheehan, MD   3 Units at 09/08/22 1318   ipratropium-albuterol (DUONEB) 0.5-2.5 (3) MG/3ML nebulizer solution 3 mL  3 mL Nebulization Q4H PRN Dessa Phi, DO        memantine (NAMENDA) tablet 5 mg  5 mg Oral BID Lovell Sheehan, MD   5 mg at 09/08/22 2956   morphine (PF) 2 MG/ML injection 0.5-1 mg  0.5-1 mg Intravenous Q2H PRN Lovell Sheehan, MD       multivitamin with minerals tablet 1 tablet  1 tablet Oral Q supper Lovell Sheehan, MD       rosuvastatin (CRESTOR) tablet 20 mg  20 mg Oral QHS Lovell Sheehan, MD   20 mg at 09/07/22 2241   senna-docusate (Senokot-S) tablet 1 tablet  1 tablet Oral QHS PRN Lovell Sheehan, MD       tamsulosin [facility]) capsule 0.4  mg  0.4 mg Oral QPC supper Lovell Sheehan, MD   0.4 mg at 09/07/22 1739     Discharge Medications: Please see discharge summary for a list of discharge medications.  Relevant Imaging Results:  Relevant Lab Results:   Additional Information    Colen Darling, LCSWA

## 2022-09-08 NOTE — Progress Notes (Signed)
1145 This RN goes to patient room with a request from daughter saying patient is having aphasia. Neuro assessment done. No concerns from baseline. Patient disoriented to time and answering questions appropriately at this time. However it was difficult to understand the asphasia due to daughter interrupting and asking questions as well. Let the attending know. VS WNL. Rapid team at bedside.

## 2022-09-08 NOTE — Evaluation (Signed)
Physical Therapy Evaluation Patient Details Name: Amanda Porter MRN: [id number] DOB: 26-Jun-1946 Today's Date: 09/08/2022  History of Present Illness  Patient is a 76 year old female with displaced subcapital fracture of the hip s/p right hip hemiarthroplasty. History of COPD, oxygen dependent, dementia, diabetes, anemia, anxiety, CVA with left side deficits, normal pressure hydrocephalus with VP shunt, tardive dyskinesia, partial lung removal, retinal detachment right.   Clinical Impression  Patient was mildly confused but agreeable to PT and able to follow single step commands with increased time. She apparently lives with her family and has a history of falls. She was ambulatory with a 4 wheeled walker at baseline per patient report.  Today, the patient has mild pain in the right hip. She was able to get OOB, stand, and complete a stand step transfer from bed to chair with +2 person assistance using a rolling walker. She needs cues for sequencing and increased time to complete tasks. She was fatigued with standing, mild shortness of breath noted with Sp02 97% on 3 L02 immediately after activity. She does not appear to be at her baseline level of functional mobility. Recommend PT follow to maximize independence and facilitate return to prior level of function. SNF is recommended for short term rehab at discharge.      Recommendations for follow up therapy are one component of a multi-disciplinary discharge planning process, led by the attending physician.  Recommendations may be updated based on patient status, additional functional criteria and insurance authorization.  Follow Up Recommendations Skilled nursing-short term rehab (<3 hours/day) Can patient physically be transported by private vehicle: No    Assistance Recommended at Discharge Frequent or constant Supervision/Assistance  Patient can return home with the following  Two people to help with walking and/or transfers;A lot of help with  bathing/dressing/bathroom;Help with stairs or ramp for entrance;Assist for transportation    Equipment Recommendations  (to be determined at next venue of care)  Recommendations for Other Services       Functional Status Assessment Patient has had a recent decline in their functional status and demonstrates the ability to make significant improvements in function in a reasonable and predictable amount of time.     Precautions / Restrictions Precautions Precautions: Fall;Posterior Hip Restrictions Weight Bearing Restrictions: Yes RLE Weight Bearing: Weight bearing as tolerated      Mobility  Bed Mobility Overal bed mobility: Needs Assistance Bed Mobility: Supine to Sit     Supine to sit: Max assist, +2 for physical assistance, HOB elevated     General bed mobility comments: verbal cues for sequencing and technique. assistance for trunk and BLE support.    Transfers Overall transfer level: Needs assistance Equipment used: Rolling walker (2 wheels) Transfers: Sit to/from Stand Sit to Stand: Mod assist, +2 physical assistance   Step pivot transfers: Mod assist, +2 physical assistance       General transfer comment: verbal cues for technique. assistance for lifting and lowering.    Ambulation/Gait               General Gait Details: did not attempt ambulation due to fatigue with activity  Stairs            Wheelchair Mobility    Modified Rankin (Stroke Patients Only)       Balance Overall balance assessment: Needs assistance Sitting-balance support: No upper extremity supported, Feet supported Sitting balance-Leahy Scale: Fair     Standing balance support: Bilateral upper extremity supported Standing balance-Leahy Scale: Poor Standing balance  comment: external support required to maintain standing balance                             Pertinent Vitals/Pain Pain Assessment Pain Assessment: Faces Faces Pain Scale: Hurts a little  bit Pain Location: R hip Pain Descriptors / Indicators: Dull, Discomfort Pain Intervention(s): Repositioned, Monitored during session, Limited activity within patient's tolerance, Ice applied (ice pack applied R hip at end of session)    Home Living Family/patient expects to be discharged to:: Private residence Living Arrangements: Other relatives;Children Available Help at Discharge: Family;Available PRN/intermittently Type of Home: House Home Access: Level entry       Home Layout: One level Home Equipment: Rollator (4 wheels);Shower seat;Grab bars - tub/shower Additional Comments: per chart pt is alone during the day    Prior Function Prior Level of Function : Patient poor historian/Family not available             Mobility Comments: per chart and pt, usese 4WW at home       Hand Dominance   Dominant Hand: Right    Extremity/Trunk Assessment   Upper Extremity Assessment Upper Extremity Assessment: Generalized weakness    Lower Extremity Assessment Lower Extremity Assessment: Generalized weakness       Communication   Communication: No difficulties  Cognition Arousal/Alertness: Awake/alert Behavior During Therapy: WFL for tasks assessed/performed Overall Cognitive Status: History of cognitive impairments - at baseline Area of Impairment: Orientation, Following commands, Safety/judgement, Memory, Problem solving                 Orientation Level: Disoriented to, Time   Memory: Decreased short-term memory Following Commands: Follows one step commands with increased time Safety/Judgement: Decreased awareness of deficits   Problem Solving: Slow processing, Requires verbal cues, Difficulty sequencing General Comments: patient reports she lives with her sister and her daughter is 32 years old. patient is aware that her right hip was broken        General Comments General comments (skin integrity, edema, etc.): Sp02 87% initially on 3 L02 and improved  with seated rest break    Exercises     Assessment/Plan    PT Assessment Patient needs continued PT services  PT Problem List Decreased strength;Decreased range of motion;Decreased activity tolerance;Decreased balance;Decreased mobility;Decreased cognition;Decreased knowledge of use of DME;Decreased safety awareness;Decreased knowledge of precautions;Cardiopulmonary status limiting activity;Pain       PT Treatment Interventions DME instruction;Gait training;Stair training;Functional mobility training;Therapeutic activities;Balance training;Therapeutic exercise;Neuromuscular re-education;Cognitive remediation;Patient/family education    PT Goals (Current goals can be found in the Care Plan section)  Acute Rehab PT Goals Patient Stated Goal: to get back home PT Goal Formulation: With patient Time For Goal Achievement: 09/22/22 Potential to Achieve Goals: Good    Frequency 7X/week     Co-evaluation               AM-PAC PT "6 Clicks" Mobility  Outcome Measure Help needed turning from your back to your side while in a flat bed without using bedrails?: A Lot Help needed moving from lying on your back to sitting on the side of a flat bed without using bedrails?: Total Help needed moving to and from a bed to a chair (including a wheelchair)?: Total Help needed standing up from a chair using your arms (e.g., wheelchair or bedside chair)?: Total Help needed to walk in hospital room?: A Lot Help needed climbing 3-5 steps with a railing? : Total 6 Click  Score: 8    End of Session Equipment Utilized During Treatment: Oxygen Activity Tolerance: Patient tolerated treatment well;Patient limited by fatigue Patient left: in chair;with call bell/phone within reach;with SCD's reapplied (lap belt alarm in place, abduction pillow in place, ice pack R hip) Nurse Communication: Mobility status PT Visit Diagnosis: History of falling (Z91.81);Difficulty in walking, not elsewhere classified  (R26.2);Muscle weakness (generalized) (M62.81)    Time: 9030-0923 PT Time Calculation (min) (ACUTE ONLY): 29 min   Charges:   PT Evaluation $PT Eval Low Complexity: 1 Low PT Treatments $Therapeutic Activity: 8-22 mins        Minna Merritts, PT, MPT   Percell Locus 09/08/2022, 10:51 AM

## 2022-09-08 NOTE — Evaluation (Signed)
Occupational Therapy Evaluation Patient Details Name: Amanda Porter MRN: [id number] DOB: 09-Dec-1945 Today's Date: 09/08/2022   History of Present Illness [name] is a 76 year old female with medical history of normal pressure hydrocephalus, status post VP shunt placement and shunt malfunction, COPD, chronic respiratory failure, history of CVA, depression, dementia, non-insulin-dependent diabetes mellitus, dysphagia, dyslipidemia, who presented to the emergency department for chief concerns of frequent falls. S/p R hemi hip arthoplasty on 09/07/22.   Clinical Impression   Ms Amanda Porter was seen for OT/PT co-evaluation this date. Prior to hospital admission, pt was MOD I using 4WW with assistance PRN for ADLs. Pt lives with family available PRN. Pt presents to acute OT demonstrating impaired ADL performance and functional mobility 2/2 decreased activity tolerance and functional strength/ROM/balance deficits. Pt disoriented to time only however cues t/o for safety and sequencing.  Pt currently requires MAX A x2 sup>sit, fair sitting balance. MOD A x2 + RW sit<>stand and bed>chair SPT. SpO2 87% on 3L [place], resolved with seated rest. MIN A self-drinking in sitting, assist for locating items and sequencing task. Chair alarm lap belt on for safety, pt demonstrated understanding of use. Pt would benefit from skilled OT to address noted impairments and functional limitations (see below for any additional details). Upon hospital discharge, recommend STR to maximize pt safety and return to PLOF.   Recommendations for follow up therapy are one component of a multi-disciplinary discharge planning process, led by the attending physician.  Recommendations may be updated based on patient status, additional functional criteria and insurance authorization.   Follow Up Recommendations  Skilled nursing-short term rehab (<3 hours/day)    Assistance Recommended at Discharge Frequent or constant Supervision/Assistance   Patient can return home with the following Two people to help with walking and/or transfers;A lot of help with bathing/dressing/bathroom;Help with stairs or ramp for entrance    Functional Status Assessment  Patient has had a recent decline in their functional status and demonstrates the ability to make significant improvements in function in a reasonable and predictable amount of time.  Equipment Recommendations  Other (comment) (defer to next venue of care)    Recommendations for Other Services       Precautions / Restrictions Precautions Precautions: Fall;Posterior Hip Restrictions Weight Bearing Restrictions: Yes RLE Weight Bearing: Weight bearing as tolerated      Mobility Bed Mobility Overal bed mobility: Needs Assistance Bed Mobility: Supine to Sit     Supine to sit: Max assist, +2 for physical assistance, HOB elevated          Transfers Overall transfer level: Needs assistance Equipment used: Rolling walker (2 wheels) Transfers: Sit to/from Stand, Bed to chair/wheelchair/BSC Sit to Stand: Mod assist, +2 physical assistance     Step pivot transfers: Mod assist, +2 physical assistance     General transfer comment: assist for managing RW and locating chair      Balance Overall balance assessment: Needs assistance Sitting-balance support: No upper extremity supported, Feet supported Sitting balance-Leahy Scale: Fair     Standing balance support: Bilateral upper extremity supported Standing balance-Leahy Scale: Poor                             ADL either performed or assessed with clinical judgement   ADL Overall ADL's : Needs assistance/impaired  General ADL Comments: MAX A for LB access at bed level. MOD A x2 + RW for simulated BSC t/f. MIN A self-drinking in sitting, assist for locating items and sequencing task.       Pertinent Vitals/Pain Pain Assessment Pain Assessment:  Faces Faces Pain Scale: Hurts a little bit Pain Location: R hip Pain Descriptors / Indicators: Dull, Discomfort Pain Intervention(s): Limited activity within patient's tolerance, Repositioned     Hand Dominance Right   Extremity/Trunk Assessment Upper Extremity Assessment Upper Extremity Assessment: Generalized weakness   Lower Extremity Assessment Lower Extremity Assessment: Generalized weakness       Communication Communication Communication: No difficulties   Cognition Arousal/Alertness: Awake/alert Behavior During Therapy: WFL for tasks assessed/performed Overall Cognitive Status: No family/caregiver present to determine baseline cognitive functioning Area of Impairment: Orientation, Following commands, Safety/judgement                 Orientation Level: Disoriented to, Time             General Comments: pt states name, location as hospital, and situation as a fall breaking R hip. States year as 70s and that her daughter is 64 yo.     General Comments  SpO2 87% on 3L [place], resolved with seated rest     Home Living Family/patient expects to be discharged to:: Private residence Living Arrangements: Other relatives;Children Available Help at Discharge: Family;Available PRN/intermittently Type of Home: House Home Access: Level entry     Home Layout: One level     Bathroom Shower/Tub: Teacher, early years/pre: Standard     Home Equipment: Rollator (4 wheels);Shower seat;Grab bars - tub/shower   Additional Comments: per chart pt is alone during the day      Prior Functioning/Environment Prior Level of Function : Patient poor historian/Family not available             Mobility Comments: per chart and pt, usese 4WW at home          OT Problem List: Decreased range of motion;Decreased strength;Decreased activity tolerance;Impaired balance (sitting and/or standing);Decreased safety awareness      OT Treatment/Interventions:  Self-care/ADL training;Therapeutic exercise;Energy conservation;DME and/or AE instruction;Therapeutic activities;Patient/family education;Balance training    OT Goals(Current goals can be found in the care plan section) Acute Rehab OT Goals Patient Stated Goal: to go home OT Goal Formulation: With patient Time For Goal Achievement: 09/22/22 Potential to Achieve Goals: Good ADL Goals Pt Will Perform Grooming: with set-up;with supervision;standing Pt Will Perform Lower Body Dressing: with min assist;with caregiver independent in assisting;sitting/lateral leans Pt Will Transfer to Toilet: with min assist;ambulating;bedside commode  OT Frequency: Min 2X/week    Co-evaluation              AM-PAC OT "6 Clicks" Daily Activity     Outcome Measure Help from another person eating meals?: A Little Help from another person taking care of personal grooming?: A Little Help from another person toileting, which includes using toliet, bedpan, or urinal?: A Lot Help from another person bathing (including washing, rinsing, drying)?: A Lot Help from another person to put on and taking off regular upper body clothing?: A Little Help from another person to put on and taking off regular lower body clothing?: A Lot 6 Click Score: 15   End of Session Equipment Utilized During Treatment: Rolling walker (2 wheels) Nurse Communication: Mobility status  Activity Tolerance: Patient tolerated treatment well Patient left: in chair;with call bell/phone within reach;with chair alarm set (lap belt chair  alarm)  OT Visit Diagnosis: Repeated falls (R29.6);Other abnormalities of gait and mobility (R26.89)                Time: 9233-0076 OT Time Calculation (min): 21 min Charges:  OT General Charges $OT Visit: 1 Visit OT Evaluation $OT Eval Moderate Complexity: 1 Mod  Dessie Coma, M.S. OTR/L  09/08/22, 9:50 AM  ascom [phone]

## 2022-09-08 NOTE — Telephone Encounter (Signed)
Notified pt's daughter via Deloris Ping message

## 2022-09-08 NOTE — TOC Progression Note (Signed)
Transition of Care [facility]) - Progression Note    Patient Details  Name: Amanda Porter MRN: [id number] Date of Birth: 1946/10/25  Transition of [facility]) CM/SW [place], [place] Phone Number: 09/08/2022, 3:07 PM  Clinical Narrative:     SW sent referrals to SNF in [facility]. The patient's FL2 has been completed. SW completing PASSR and following up on acceptances. Patient has [employer].       Expected Discharge Plan and Services          SNF                                       Social Determinants of Health (SDOH) Interventions    Readmission Risk Interventions     No data to display

## 2022-09-08 NOTE — Progress Notes (Signed)
PROGRESS NOTE    Amanda Porter  OZH:[id number] DOB: March 28, 1946 DOA: 09/06/2022 PCP: Pleas Koch, NP     Brief Narrative:  Amanda Porter is a 76 year old female with medical history of normal pressure hydrocephalus, status post VP shunt placement and shunt malfunction, COPD, chronic respiratory failure, history of CVA, depression, dementia, non-insulin-dependent diabetes mellitus, dysphagia, dyslipidemia, who presented to the emergency department for chief concerns of frequent falls.  X-ray of the right hip showed mildly displaced right femoral neck fracture.  Orthopedic surgery was consulted.  Patient underwent right hip hemiarthroplasty by Dr. Harlow Mares 10/8.  New events last 24 hours / Subjective: Asking for water this morning.  Seem mildly confused but able to make needs known.  States she is short of breath, did receive breathing treatment this morning.  Was about to work with physical therapy as I left the room.    After my initial evaluation, I was notified by RN that daughter complained that patient was having expressive aphasia.  RN did not notice significant change from baseline.  I spoke with daughter by phone; she stated that patient was having difficulty getting words out, this was a change from this morning when she spoke to pt by phone. I was split between campuses and was at [facility] in [place] so asked one of my partners to check in on patient on my behalf. On my partner's exam, did not see any focal deficits. I ordered CT head and neuro consult due to family's concerns.   Assessment & Plan:   Principal Problem:   Closed displaced fracture of right femoral neck (HCC) Active Problems:   NPH (normal pressure hydrocephalus) (HCC)   Hypokalemia   Dementia without behavioral disturbance (HCC)   S/P VP shunt   Type 2 diabetes mellitus with hyperglycemia (HCC)   Anxiety and depression   History of CVA (cerebrovascular accident)   Hyperlipidemia   Dysphagia   Essential  hypertension   Memory disturbance   DNR (do not resuscitate)   Multiple lung nodules   Right hip fracture status post mechanical fall -S/p right hip hemiarthroplasty by Dr. Harlow Mares 10/8 -PT recommending SNF placement   Expressive aphasia -CT head -Neuro consult, Dr. Quinn Axe   NPH status post VP shunt -Has outpatient VP shunt surgery scheduled 10/20  Dementia -Memantine  Diabetes mellitus -Sliding scale insulin  Mood disorder -Bupropion, BuSpar, klonopin   Hyperlipidemia -Crestor  Hypertension -Norvasc  COPD with chronic hypoxemia respiratory failure -Without exacerbation. Wears 3L at baseline.  -Titrate to maintain sat 88-92%   Stable 12 mm solid nodule in the anterior apical region of the left lung. Consider continued follow-up by chest CT in 6 months, or further characterization with PET-CT.   DVT prophylaxis:  enoxaparin (LOVENOX) injection 40 mg Start: 09/08/22 0800 SCDs Start: 09/07/22 1420 Place TED hose Start: 09/06/22 1250  Code Status: DNR Family Communication: None at bedside. Discussed with daughter by phone.  Disposition Plan:  Status is: Inpatient Remains inpatient appropriate because: Aphasia work up. Will need SNF    Antimicrobials:  Anti-infectives (From admission, onward)    Start     Dose/Rate Route Frequency Ordered Stop   09/07/22 0954  ceFAZolin (ANCEF) 2-4 GM/100ML-% IVPB       Note to Pharmacy: Mirna Mires: cabinet override      09/07/22 0954 09/07/22 1130   09/07/22 0618  ceFAZolin (ANCEF) IVPB 2g/100 mL premix        2 g 200 mL/hr over 30 Minutes Intravenous 30 min  pre-op 09/07/22 0619 09/07/22 1137        Objective: Vitals:   09/07/22 2027 09/08/22 0711 09/08/22 0749 09/08/22 1210  BP:   (!) 118/59 (!) 116/53  Pulse:   97 89  Resp:      Temp:   98.1 F (36.7 C) 98.5 F (36.9 C)  TempSrc:      SpO2: 92% 96% 94% 96%  Weight:      Height:        Intake/Output Summary (Last 24 hours) at 09/08/2022 1227 Last  data filed at 09/08/2022 0900 Gross per 24 hour  Intake 240 ml  Output --  Net 240 ml    Filed Weights   09/06/22 1006  Weight: 47 kg    Examination (initial exam around 8-9am):  General exam: Appears calm and comfortable  Respiratory system: Clear to auscultation. Respiratory effort normal. No respiratory distress. No conversational dyspnea. On O2  Cardiovascular system: S1 & S2 heard, RRR. No murmurs. No pedal edema. Gastrointestinal system: Abdomen is nondistended, soft and nontender. Normal bowel sounds heard. Central nervous system: Alert and oriented. No focal neurological deficits. Speech clear. Mild confusion but able to make needs known  Extremities: Symmetric in appearance  Skin: No rashes, lesions or ulcers on exposed skin  Psychiatry: Judgement and insight appear normal. Mood & affect appropriate.   Data Reviewed: I have personally reviewed following labs and imaging studies  CBC: Recent Labs  Lab 09/06/22 1020 09/07/22 0424 09/08/22 0453  WBC 16.1* 10.6* 12.7*  HGB 9.5* 8.5* 8.1*  HCT 31.5* 28.7* 26.8*  MCV 91.3 90.8 91.2  PLT 298 288 696    Basic Metabolic Panel: Recent Labs  Lab 09/06/22 1020 09/07/22 0424  NA 141 141  K 2.8* 3.8  CL 103 107  CO2 32 30  GLUCOSE 169* 87  BUN 23 23  CREATININE 0.65 0.60  CALCIUM 9.0 8.7*  MG 2.0  --     GFR: Estimated Creatinine Clearance: 43.7 mL/min (by C-G formula based on SCr of 0.6 mg/dL). Liver Function Tests: Recent Labs  Lab 09/06/22 1020  AST 15  ALT 13  ALKPHOS 60  BILITOT 0.6  PROT 7.3  ALBUMIN 3.5    Recent Labs  Lab 09/06/22 1020  LIPASE 25    No results for input(s): "AMMONIA" in the last 168 hours. Coagulation Profile: No results for input(s): "INR", "PROTIME" in the last 168 hours. Cardiac Enzymes: No results for input(s): "CKTOTAL", "CKMB", "CKMBINDEX", "TROPONINI" in the last 168 hours. BNP (last 3 results) Recent Labs    04/04/22 1025  PROBNP 13.0    HbA1C: No results  for input(s): "HGBA1C" in the last 72 hours. CBG: Recent Labs  Lab 09/07/22 0805 09/07/22 1254 09/07/22 1726 09/08/22 0907 09/08/22 1214  GLUCAP 85 87 105* 157* 245*    Lipid Profile: No results for input(s): "CHOL", "HDL", "LDLCALC", "TRIG", "CHOLHDL", "LDLDIRECT" in the last 72 hours. Thyroid Function Tests: No results for input(s): "TSH", "T4TOTAL", "FREET4", "T3FREE", "THYROIDAB" in the last 72 hours. Anemia Panel: No results for input(s): "VITAMINB12", "FOLATE", "FERRITIN", "TIBC", "IRON", "RETICCTPCT" in the last 72 hours. Sepsis Labs: No results for input(s): "PROCALCITON", "LATICACIDVEN" in the last 168 hours.  Recent Results (from the past 240 hour(s))  SARS Coronavirus 2 by RT PCR (hospital order, performed in [facility] hospital lab) *cepheid single result test* Anterior Nasal Swab     Status: None   Collection Time: 09/06/22 10:28 AM   Specimen: Anterior Nasal Swab  Result Value Ref  Range Status   SARS Coronavirus 2 by RT PCR NEGATIVE NEGATIVE Final    Comment: (NOTE) SARS-CoV-2 target nucleic acids are NOT DETECTED.  The SARS-CoV-2 RNA is generally detectable in upper and lower respiratory specimens during the acute phase of infection. The lowest concentration of SARS-CoV-2 viral copies this assay can detect is 250 copies / mL. A negative result does not preclude SARS-CoV-2 infection and should not be used as the sole basis for treatment or other patient management decisions.  A negative result may occur with improper specimen collection / handling, submission of specimen other than nasopharyngeal swab, presence of viral mutation(s) within the areas targeted by this assay, and inadequate number of viral copies (<250 copies / mL). A negative result must be combined with clinical observations, patient history, and epidemiological information.  Fact Sheet for Patients:   https://www.patel.info/  Fact Sheet for Healthcare  Providers: https://hall.com/  This test is not yet approved or  cleared by the [place] FDA and has been authorized for detection and/or diagnosis of SARS-CoV-2 by FDA under an Emergency Use Authorization (EUA).  This EUA will remain in effect (meaning this test can be used) for the duration of the COVID-19 declaration under Section 564(b)(1) of the Act, 21 U.S.C. section 360bbb-3(b)(1), unless the authorization is terminated or revoked sooner.  Performed at [facility], [address] [place], [place] [address]       Radiology Studies: DG Pelvis 1-2 Views  Result Date: 09/07/2022 CLINICAL DATA:  Postop right hip radiographs. EXAM: PELVIS - 1-2 VIEW COMPARISON:  09/06/2022. FINDINGS: Single AP view shows a right hip hemiarthroplasty, which appears well seated and aligned. No acute fracture or evidence of an operative complication. IMPRESSION: Well-positioned right hip hemiarthroplasty. Electronically Signed   By: Lajean Manes M.D.   On: 09/07/2022 14:14      Scheduled Meds:  amLODipine  2.5 mg Oral QHS   arformoterol  15 mcg Nebulization BID   And   umeclidinium bromide  1 puff Inhalation Daily   buPROPion  300 mg Oral Q breakfast   busPIRone  30 mg Oral QHS   busPIRone  45 mg Oral q AM   clonazePAM  0.5 mg Oral QHS   enoxaparin (LOVENOX) injection  40 mg Subcutaneous Q24H   feeding supplement  237 mL Oral BID BM   folic acid  1 mg Oral Daily   insulin aspart  0-5 Units Subcutaneous QHS   insulin aspart  0-9 Units Subcutaneous TID WC   ketorolac  7.5 mg Intravenous Q6H   memantine  5 mg Oral BID   multivitamin with minerals  1 tablet Oral Q supper   rosuvastatin  20 mg Oral QHS   tamsulosin  0.4 mg Oral QPC supper   Continuous Infusions:   LOS: 2 days     Dessa Phi, DO [facility] 09/08/2022, 12:27 PM   Available via Epic secure chat 7am-7pm After these hours, please refer to coverage provider listed on  [email or web address]

## 2022-09-08 NOTE — Progress Notes (Signed)
   09/08/22 1300  Clinical Encounter Type  Visited With Patient and family together  Visit Type Initial  Referral From Nurse  Consult/Referral To Chaplain   Chaplain responded to Rapid and offered services and presence to daughter of patient.

## 2022-09-08 NOTE — Consult Note (Addendum)
NEUROLOGY CONSULTATION NOTE   Date of service: September 08, 2022 Patient Name: Amanda Porter MRN:  [id number] DOB:  1946/04/07 Reason for consult: episode of confusion and word finding difficulty Requesting physician: Dr. Dessa Phi _ _ _   _ __   _ __ _ _  __ __   _ __   __ _  History of Present Illness   This is a 76 year old woman with past medical history significant for normal pressure hydrocephalus status post VP shunt placement and shunt malfunction scheduled for revision later this month, COPD, chronic respiratory failure, history of CVA, depression, dementia, non-insulin-dependent diabetes, dyslipidemia who is admitted after a right femoral neck fracture secondary to fall.  She underwent a right hip hemiarthroplasty with Dr. Harlow Mares yesterday October 8.  She is provided primarily by daughter Amanda Porter at bedside who is a paramedic.  Patient became somewhat confused last night although she has a history of sundowning in the context of dementia therefore daughter was not very concerned.  She had a discrete episode today of word finding difficulty.  Daughter showed me a video of the event and patient was confused and had difficulty expressing her needs for example she said she wanted to drink some sugar up when she meant that she wanted to drink some water.  She was also trying to drink out of a spoon that was in her soup cup.  She has never had an episode exactly like this before.  She has had 1 seizure in the past in the setting of mixing up her medications and accidentally overdosing on bupropion.  She is currently on bupropion 300 mg daily but has not had any other seizures on that dose.  She had a head CT today that was unremarkable after the event.  She is unable to get an MRI at [facility] due to her shunt.  She was at her mental baseline when I saw her afterwards this afternoon with no word finding difficulties.   ROS   Per HPI: all other systems reviewed and are negative  Past History   I have  reviewed the following:  Past Medical History:  Diagnosis Date   Acute respiratory failure requiring reintubation ([place]) 11/05/2021   Anemia    Anxiety    Anxiety and depression    COPD (chronic obstructive pulmonary disease) (HCC)    CVA (cerebral vascular accident) ([place])    CVA (cerebral vascular accident) ([place])    L sided deficits   Dementia ([place])    Depression    Diabetes mellitus ([place])    Type 2   Diabetes mellitus without complication ([place])    Dysphagia    Dysphasia    GERD (gastroesophageal reflux disease)    History of lung cancer    Hyperlipidemia    Hypertension    Influenza A 12/13/2021   Memory disturbance 11/20/2017   Normal pressure hydrocephalus ([place]) 2018   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   Past Surgical History:  Procedure Laterality Date   HIP ARTHROPLASTY Right 09/07/2022   Procedure: ARTHROPLASTY BIPOLAR HIP (HEMIARTHROPLASTY);  Surgeon: Lovell Sheehan, MD;  Location: [facility] ORS;  Service: Orthopedics;  Laterality: Right;   IR FL GUIDED LOC OF NEEDLE/CATH TIP FOR SPINAL INJECTION  RT  07/25/2022   LUNG REMOVAL, PARTIAL     left upper lobe   VENTRICULOPERITONEAL SHUNT  12/09/2016   VENTRICULOPERITONEAL SHUNT  10/2018   Family History  Problem Relation Age of Onset   Pneumonia Mother    Cancer Father    Diabetes Sister    Diabetes Sister    Breast cancer Neg Hx    Social History   Socioeconomic History   Marital status: Divorced    Spouse name: Not on file   Number of children: 1   Years of education: trade school   Highest education level: Not on file  Occupational History   Occupation: Retired  Tobacco Use   Smoking status: Former    Packs/day: 0.50    Years: 50.00    Total pack years: 25.00    Types:  Cigarettes    Quit date: 08/01/2017    Years since quitting: 5.1   Smokeless tobacco: Never  Vaping Use   Vaping Use: Never used  Substance and Sexual Activity   Alcohol use: Not Currently   Drug use: Not Currently   Sexual activity: Not Currently  Other Topics Concern   Not on file  Social History Narrative   ** Merged History Encounter **       Moved from [place]. Lives with daughter. Right handed    Social Determinants of Health   Financial Resource Strain: Not on file  Food Insecurity: No Food Insecurity (09/06/2022)   Hunger Vital Sign    Worried About Running Out of Food in the Last Year: Never true    Ran Out of Food in the Last Year: Never true  Transportation Needs: No Transportation Needs (09/06/2022)   PRAPARE - Hydrologist (Medical): No    Lack of Transportation (Non-Medical): No  Physical Activity: Not on file  Stress: Not on file  Social Connections: Not on file   Allergies  Allergen Reactions   Lexapro [Escitalopram Oxalate] Other (See Comments)    Dizziness, nausea, fatigue   Neosporin  [Neomycin-Polymyxin-Gramicidin] Rash    (Neosporin)   Neosporin [Bacitracin-Polymyxin B] Rash    Medications   Medications Prior to Admission  Medication Sig Dispense Refill Last Dose   Accu-Chek Softclix Lancets lancets Use as instructed 100 each 12    acetaminophen (TYLENOL) 500 MG tablet Take 500-1,000 mg by mouth every 6 (six) hours as needed for mild pain or moderate pain.   Unknown at PRN   albuterol (PROVENTIL) (2.5 MG/3ML) 0.083% nebulizer solution USE 1 VIAL IN NEBULIZER EVERY 6 HOURS (Patient taking differently: Take 2.5 mg by nebulization every 6 (six) hours as needed for wheezing or shortness of breath.) 360 mL 11 Unknown at PRN   albuterol (VENTOLIN HFA) 108 (90 Base) MCG/ACT inhaler INHALE 2 PUFFS BY MOUTH EVERY 4 HOURS AS NEEDED 18 g 12 Unknown at PRN   amLODipine (NORVASC) 2.5 MG tablet Take 1 tablet (2.5 mg total) by mouth  daily. for blood pressure. (Patient taking differently: Take 2.5 mg by mouth at bedtime. for blood pressure.) 90 tablet 1 09/05/2022 at 2000   buPROPion (WELLBUTRIN XL) 300 MG 24 hr tablet Take 1 tablet (300 mg total) by mouth daily with breakfast.   09/06/2022 at 0800   busPIRone (BUSPAR) 15 MG tablet Take 45 mg by mouth in the morning.   09/06/2022 at 0800   busPIRone (BUSPAR) 30 MG tablet Take 1 tablet (30 mg total) by mouth at bedtime.   09/05/2022 at 2000  clonazePAM (KLONOPIN) 0.5 MG tablet Take 0.5 mg by mouth at bedtime.   09/05/2022 at 2000   conjugated estrogens (PREMARIN) vaginal cream Apply one pea-sized amount around the opening of the urethra daily for 2 weeks, then 3 times weekly moving forward. 30 g 4    folic acid (FOLVITE) 1 MG tablet Take 1 tablet (1 mg total) by mouth daily. 90 tablet 3 09/06/2022 at 0800   glipiZIDE (GLUCOTROL XL) 2.5 MG 24 hr tablet TAKE 1 TABLET (2.5 MG TOTAL) BY MOUTH DAILY WITH BREAKFAST. FOR DIABETES. 90 tablet 0 09/06/2022 at 0800   glucose blood test strip Use as instructed 100 each 12    memantine (NAMENDA) 5 MG tablet Take 1 tablet (5 mg total) by mouth 2 (two) times daily. For memory. 180 tablet 3 09/06/2022 at 0800   rosuvastatin (CRESTOR) 20 MG tablet TAKE 1 TABLET (20 MG TOTAL) BY MOUTH EVERY EVENING. FOR CHOLESTEROL. 90 tablet 3 09/05/2022 at 2000   tamsulosin (FLOMAX) 0.4 MG CAPS capsule Take 1 capsule (0.4 mg total) by mouth daily. For urine retention (Patient taking differently: Take 0.4 mg by mouth daily after supper. For urine retention) 90 capsule 1 09/05/2022 at 2000   Tiotropium Bromide Monohydrate (SPIRIVA RESPIMAT) 1.25 MCG/ACT AERS Inhale 2 puffs into the lungs daily. (Patient taking differently: Inhale 2 puffs into the lungs daily. If unable to pay for Stiolto) 2 each 0    Tiotropium Bromide-Olodaterol (STIOLTO RESPIMAT) 2.5-2.5 MCG/ACT AERS INHALE 2 PUFFS BY MOUTH INTO THE LUNGS DAILY (Patient taking differently: Inhale 2 puffs into the lungs  daily.) 12 g 4 09/06/2022 at 0800      Current Facility-Administered Medications:    acetaminophen (TYLENOL) tablet 325-650 mg, 325-650 mg, Oral, Q6H PRN, Lovell Sheehan, MD   albuterol (PROVENTIL) (2.5 MG/3ML) 0.083% nebulizer solution 2.5 mg, 2.5 mg, Nebulization, Q6H PRN, Lovell Sheehan, MD, 2.5 mg at 09/08/22 0708   amLODipine (NORVASC) tablet 2.5 mg, 2.5 mg, Oral, QHS, Lovell Sheehan, MD, 2.5 mg at 09/07/22 2240   arformoterol (BROVANA) nebulizer solution 15 mcg, 15 mcg, Nebulization, BID, 15 mcg at 09/08/22 0709 **AND** umeclidinium bromide (INCRUSE ELLIPTA) 62.5 MCG/ACT 1 puff, 1 puff, Inhalation, Daily, Lovell Sheehan, MD, 1 puff at 09/08/22 2440   aspirin chewable tablet 81 mg, 81 mg, Oral, Daily, Derek Jack, MD   bisacodyl (DULCOLAX) EC tablet 5 mg, 5 mg, Oral, Daily PRN, Lovell Sheehan, MD   buPROPion (WELLBUTRIN XL) 24 hr tablet 300 mg, 300 mg, Oral, Q breakfast, Lovell Sheehan, MD, 300 mg at 09/08/22 1027   busPIRone (BUSPAR) tablet 30 mg, 30 mg, Oral, QHS, Lovell Sheehan, MD, 30 mg at 09/07/22 2241   busPIRone (BUSPAR) tablet 45 mg, 45 mg, Oral, q AM, Lovell Sheehan, MD, 45 mg at 09/08/22 2536   clonazePAM (KLONOPIN) tablet 0.5 mg, 0.5 mg, Oral, QHS, Lovell Sheehan, MD, 0.5 mg at 09/07/22 2241   enoxaparin (LOVENOX) injection 40 mg, 40 mg, Subcutaneous, Q24H, Lovell Sheehan, MD, 40 mg at 09/08/22 6440   feeding supplement (ENSURE ENLIVE / ENSURE PLUS) liquid 237 mL, 237 mL, Oral, BID BM, Lovell Sheehan, MD, 237 mL at 09/08/22 1324   fentaNYL (SUBLIMAZE) injection 25 mcg, 25 mcg, Intravenous, Q2H PRN, Lovell Sheehan, MD   folic acid (FOLVITE) tablet 1 mg, 1 mg, Oral, Daily, Lovell Sheehan, MD, 1 mg at 09/08/22 3474   hydrALAZINE (APRESOLINE) tablet 10 mg, 10 mg, Oral, Q8H PRN, Kurtis Bushman  R, MD   HYDROcodone-acetaminophen (NORCO) 7.5-325 MG per tablet 1-2 tablet, 1-2 tablet, Oral, Q4H PRN, Lovell Sheehan, MD   HYDROcodone-acetaminophen (NORCO/VICODIN) 5-325 MG  per tablet 1-2 tablet, 1-2 tablet, Oral, Q4H PRN, Lovell Sheehan, MD, 2 tablet at 09/07/22 2242   insulin aspart (novoLOG) injection 0-5 Units, 0-5 Units, Subcutaneous, QHS, Lovell Sheehan, MD   insulin aspart (novoLOG) injection 0-9 Units, 0-9 Units, Subcutaneous, TID WC, Lovell Sheehan, MD, 3 Units at 09/08/22 1318   ipratropium-albuterol (DUONEB) 0.5-2.5 (3) MG/3ML nebulizer solution 3 mL, 3 mL, Nebulization, Q4H PRN, Dessa Phi, DO   memantine [facility]) tablet 5 mg, 5 mg, Oral, BID, Lovell Sheehan, MD, 5 mg at 09/08/22 7616   morphine (PF) 2 MG/ML injection 0.5-1 mg, 0.5-1 mg, Intravenous, Q2H PRN, Lovell Sheehan, MD   multivitamin with minerals tablet 1 tablet, 1 tablet, Oral, Q supper, Lovell Sheehan, MD   rosuvastatin (CRESTOR) tablet 20 mg, 20 mg, Oral, QHS, Lovell Sheehan, MD, 20 mg at 09/07/22 2241   senna-docusate (Senokot-S) tablet 1 tablet, 1 tablet, Oral, QHS PRN, Lovell Sheehan, MD   tamsulosin [facility]) capsule 0.4 mg, 0.4 mg, Oral, QPC supper, Lovell Sheehan, MD, 0.4 mg at 09/07/22 1739  Vitals   Vitals:   09/07/22 2027 09/08/22 0711 09/08/22 0749 09/08/22 1210  BP:   (!) 118/59 (!) 116/53  Pulse:   97 89  Resp:      Temp:   98.1 F (36.7 C) 98.5 F (36.9 C)  TempSrc:      SpO2: 92% 96% 94% 96%  Weight:      Height:         Body mass index is 18.95 kg/m.  Physical Exam   Physical Exam Gen: oriented to self, hospital, and daughter at bedside, follows simple commands, NAD Resp: CTAB, no w/r/r CV: RRR, no m/g/r; nml S1 and S2. 2+ symmetric peripheral pulses.  Neuro: *MS: oriented to self, hospital, and daughter at bedside, follows simple commands *Speech: fluid, nondysarthric, able to name and repeat *CN:    I: Deferred   II,III: PERRLA, VFF by confrontation, optic discs unable to be visualized 2/2 pupillary constriction   III,IV,VI: EOMI w/o nystagmus, no ptosis   V: Sensation intact from V1 to V3 to LT   VII: Eyelid closure was full.  Smile  symmetric.   VIII: Hearing intact to voice   IX,X: Voice normal, palate elevates symmetrically    XI: SCM/trap 5/5 bilat   XII: Tongue protrudes midline, no atrophy or fasciculations   *Motor:   Normal bulk.  No tremor, rigidity or bradykinesia. No pronator drift. BUE 4+/5 throughout. Proximal BLE unable to be tested 2/2 immobilization post surgery but PF/DF 5/5 bilat.  *Sensory: SILT. No double-simultaneous extinction.  *Coordination:  FNF without frank ataxia bilat *Reflexes:  1+ and symmetric throughout BUE, BLE UTA, toes down bilat *Gait: deferred  NIHSS  1a Level of Conscious.: 0 1b LOC Questions: 1 1c LOC Commands: 0 2 Best Gaze: 0 3 Visual: 0 4 Facial Palsy: 0 5a Motor Arm - left: 1 5b Motor Arm - Right: 1 6a Motor Leg - Left: U 6b Motor Leg - Right: U 7 Limb Ataxia: 0 8 Sensory: 0 9 Best Language: 0 10 Dysarthria: 0 11 Extinct. and Inatten.: 0  TOTAL: 3 (nonfocal and favored to be baseline)   Premorbid mRS = 4   Labs   CBC:  Recent Labs  Lab 09/07/22 0424 09/08/22 0453  WBC 10.6* 12.7*  HGB 8.5* 8.1*  HCT 28.7* 26.8*  MCV 90.8 91.2  PLT 288 027    Basic Metabolic Panel:  Lab Results  Component Value Date   NA 141 09/07/2022   K 3.8 09/07/2022   CO2 30 09/07/2022   GLUCOSE 87 09/07/2022   BUN 23 09/07/2022   CREATININE 0.60 09/07/2022   CALCIUM 8.7 (L) 09/07/2022   GFRNONAA >60 09/07/2022   GFRAA >60 03/24/2018   Lipid Panel:  Lab Results  Component Value Date   LDLCALC 63 03/14/2021   HgbA1c:  Lab Results  Component Value Date   HGBA1C 7.6 (H) 04/04/2022   Urine Drug Screen: No results found for: "LABOPIA", "COCAINSCRNUR", "LABBENZ", "AMPHETMU", "THCU", "LABBARB"  Alcohol Level     Component Value Date/Time   ETH <10 11/04/2021 2144    Impression   This is a 76 year old woman with past medical history significant for normal pressure hydrocephalus status post VP shunt placement and shunt malfunction scheduled for revision later  this month, COPD, chronic respiratory failure, history of CVA, depression, dementia, non-insulin-dependent diabetes, dyslipidemia who is admitted after a right femoral neck fracture secondary to fall.  Neurology consulted after she had an episode of confusion and word finding difficulty around noon today.  Ddx is delirium + pain meds on top of baseline dementia (favored), seizure (risk factors of shunt and bupropion), and TIA or small stroke. Plan to repeat head CT at 24 hrs and get EEG. I am going to continue her bupropion for now since she has tolerated it previously but if she has another discrete event I would d/c it. The only prior seizure she has had was in the context of accidental bupropion overdose.  Recommendations   - Start aspirin 81mg  daily - Repeat head CT tomorrow to r/o interval ischemic change. Hold additional stroke workup until that time - Patient unable to get [facility] w/ shunt - rEEG - Will continue bupropion for now since she has tolerated it in the past, but if she has another discrete episode would discontinue - Delirium precautions - Will continue to follow ______________________________________________________________________   Thank you for the opportunity to take part in the care of this patient. If you have any further questions, please contact the neurology consultation attending.  Signed,  Su Monks, MD [facility] [phone]  If 7pm- 7am, please page neurology on call as listed in [place].  **Any copied and pasted documentation in this note was written by me in another application not billed for and pasted by me into this document.

## 2022-09-08 NOTE — Significant Event (Signed)
Rapid Response Event Note   Reason for Call :   Aphasia  Initial Focused Assessment:   Per bedside RN- patient vitals sable- and alert- speaking no differently per her assessment- though patient's daughter stated that patient is not talking as her normal.  Patient's daughter is suggesting a CT of head be performed.     Interventions:  MD Dr. Maylene Roes was notified and CT of head was ordered.  Dr. Priscella Mann at bedside and assessed patient prior to sending down for CT.    Plan of Care:     Event Summary:  Patient brought back to her original room- daughter at bedside.   MD Notified: Dr. Maylene Roes /Dr. Priscella Mann Call Time: Arrival Time:12:07 End Time:12:52  Reggie Pile, RN

## 2022-09-08 NOTE — Telephone Encounter (Signed)
-----   Message from Meade Maw, MD sent at 09/05/2022  7:52 AM EDT ----- Id like her daughter to send in a picture of her scalp where the cut was 3 days ahead of surgery so that we can make sure it has healed enough to proceed on 10/20

## 2022-09-09 ENCOUNTER — Inpatient Hospital Stay: Payer: Medicare PPO

## 2022-09-09 DIAGNOSIS — S72001A Fracture of unspecified part of neck of right femur, initial encounter for closed fracture: Secondary | ICD-10-CM | POA: Diagnosis not present

## 2022-09-09 LAB — GLUCOSE, CAPILLARY
Glucose-Capillary: 127 mg/dL — ABNORMAL HIGH (ref 70–99)
Glucose-Capillary: 143 mg/dL — ABNORMAL HIGH (ref 70–99)
Glucose-Capillary: 136 mg/dL — ABNORMAL HIGH (ref 70–99)
Glucose-Capillary: 139 mg/dL — ABNORMAL HIGH (ref 70–99)

## 2022-09-09 MED ORDER — HYDROCODONE-ACETAMINOPHEN 5-325 MG PO TABS: 1.0000 | ORAL_TABLET | ORAL | 0 refills | Status: DC | PRN

## 2022-09-09 MED ORDER — ENOXAPARIN SODIUM 40 MG/0.4ML IJ SOSY: 40.0000 mg | PREFILLED_SYRINGE | INTRAMUSCULAR | 0 refills | Status: DC

## 2022-09-09 NOTE — Progress Notes (Addendum)
PROGRESS NOTE    Amanda Porter  WFU:[id number] DOB: [birth year] DOA: 09/06/2022 PCP: Pleas Koch, NP     Brief Narrative:  Amanda Porter is a 76 year old female with medical history of normal pressure hydrocephalus, status post VP shunt placement and shunt malfunction, COPD, chronic respiratory failure, history of CVA, depression, dementia, non-insulin-dependent diabetes mellitus, dysphagia, dyslipidemia, who presented to the emergency department for chief concerns of frequent falls.  X-ray of the right hip showed mildly displaced right femoral neck fracture.  Orthopedic surgery was consulted.  Patient underwent right hip hemiarthroplasty by Dr. Harlow Mares 10/8.  Had episode of expressive aphasia 10/9.  Neurology consulted and patient underwent CT head.  New events last 24 hours / Subjective: Alert and oriented to self, year, situation.  She was able to name various objects around the room, what she ate for breakfast appropriately.  However, she does seem to have some mild cognitive deficits, required frequent redirection.  She was able to follow simple commands.  Assessment & Plan:   Principal Problem:   Closed displaced fracture of right femoral neck (HCC) Active Problems:   NPH (normal pressure hydrocephalus) (HCC)   Hypokalemia   Dementia without behavioral disturbance (HCC)   S/P VP shunt   Type 2 diabetes mellitus with hyperglycemia (HCC)   Anxiety and depression   History of CVA (cerebrovascular accident)   Hyperlipidemia   Dysphagia   Essential hypertension   Memory disturbance   DNR (do not resuscitate)   Multiple lung nodules   Right hip fracture status post mechanical fall -S/p right hip hemiarthroplasty by Dr. Harlow Mares 10/8 -PT recommending SNF placement   Expressive aphasia -CT head: Unremarkable -Repeat CT head: Unremarkable -EEG pending -Appreciate neurology.  Could be secondary to delirium, pain medication on top of baseline dementia, seizure with risk factor  of shunt and bupropion use, TIA/small stroke -Started on baby aspirin  NPH status post VP shunt -Has outpatient VP shunt surgery scheduled 10/20  Dementia -Memantine  Diabetes mellitus -Sliding scale insulin  Mood disorder -Bupropion, BuSpar, klonopin   Hyperlipidemia -Crestor  Hypertension -Norvasc  COPD with chronic hypoxemia respiratory failure -Without exacerbation. Wears 3L at baseline.  -Titrate to maintain sat 88-92%   Stable 12 mm solid nodule in the anterior apical region of the left lung. Consider continued follow-up by chest CT in 6 months, or further characterization with PET-CT.  Incomplete bladder emptying -Followed by Urology (08/20/2022). Bladder scan normal during that visit. Recommended in and out cath PRN only. Daughter really wants to leave current foley in place, believes that with patient's mentation and post-op hip pain, patient would do poorly without catheter. We discussed risk of continuing foley such as CAUTI. She accepts increased risk of infection and is requesting that we leave foley in place until discharge.    DVT prophylaxis:  enoxaparin (LOVENOX) injection 40 mg Start: 09/08/22 0800 SCDs Start: 09/07/22 1420 Place TED hose Start: 09/06/22 1250  Code Status: DNR Family Communication: None at bedside; updated daughter over the phone  Disposition Plan:  Status is: Inpatient Remains inpatient appropriate because: Aphasia work up. Will need SNF    Antimicrobials:  Anti-infectives (From admission, onward)    Start     Dose/Rate Route Frequency Ordered Stop   09/07/22 0954  ceFAZolin (ANCEF) 2-4 GM/100ML-% IVPB       Note to Pharmacy: Mirna Mires: cabinet override      09/07/22 0954 09/07/22 1130   09/07/22 0618  ceFAZolin (ANCEF) IVPB 2g/100 mL premix  2 g 200 mL/hr over 30 Minutes Intravenous 30 min pre-op 09/07/22 0619 09/07/22 1137        Objective: Vitals:   09/09/22 0001 09/09/22 0449 09/09/22 0720 09/09/22  0813  BP: (!) 119/47 (!) 135/56  136/64  Pulse: 93 92  96  Resp: 20   20  Temp: 98.2 F (36.8 C)   97.8 F (36.6 C)  TempSrc:      SpO2: 99%  100% 99%  Weight:      Height:        Intake/Output Summary (Last 24 hours) at 09/09/2022 1134 Last data filed at 09/09/2022 0300 Gross per 24 hour  Intake 120 ml  Output 2000 ml  Net -1880 ml    Filed Weights   09/06/22 1006  Weight: 47 kg    Examination (initial exam around 8-9am):  General exam: Appears calm and comfortable  Respiratory system: Clear to auscultation. Respiratory effort normal. No respiratory distress. No conversational dyspnea. On O2  Cardiovascular system: S1 & S2 heard, RRR. No murmurs. No pedal edema. Gastrointestinal system: Abdomen is nondistended, soft and nontender. Normal bowel sounds heard. Central nervous system: Alert and oriented. No focal neurological deficits. Speech clear. Mild confusion but able to make needs known.  Moves all extremities appropriately. Extremities: Symmetric in appearance  Skin: No rashes, lesions or ulcers on exposed skin.  Dressing over right lateral hip Psychiatry: Judgement and insight appear normal. Mood & affect appropriate.   Data Reviewed: I have personally reviewed following labs and imaging studies  CBC: Recent Labs  Lab 09/06/22 1020 09/07/22 0424 09/08/22 0453  WBC 16.1* 10.6* 12.7*  HGB 9.5* 8.5* 8.1*  HCT 31.5* 28.7* 26.8*  MCV 91.3 90.8 91.2  PLT 298 288 644    Basic Metabolic Panel: Recent Labs  Lab 09/06/22 1020 09/07/22 0424  NA 141 141  K 2.8* 3.8  CL 103 107  CO2 32 30  GLUCOSE 169* 87  BUN 23 23  CREATININE 0.65 0.60  CALCIUM 9.0 8.7*  MG 2.0  --     GFR: Estimated Creatinine Clearance: 43.7 mL/min (by C-G formula based on SCr of 0.6 mg/dL). Liver Function Tests: Recent Labs  Lab 09/06/22 1020  AST 15  ALT 13  ALKPHOS 60  BILITOT 0.6  PROT 7.3  ALBUMIN 3.5    Recent Labs  Lab 09/06/22 1020  LIPASE 25    No results for  input(s): "AMMONIA" in the last 168 hours. Coagulation Profile: No results for input(s): "INR", "PROTIME" in the last 168 hours. Cardiac Enzymes: No results for input(s): "CKTOTAL", "CKMB", "CKMBINDEX", "TROPONINI" in the last 168 hours. BNP (last 3 results) Recent Labs    04/04/22 1025  PROBNP 13.0    HbA1C: No results for input(s): "HGBA1C" in the last 72 hours. CBG: Recent Labs  Lab 09/08/22 0907 09/08/22 1214 09/08/22 1629 09/08/22 2205 09/09/22 0806  GLUCAP 157* 245* 204* 161* 127*    Lipid Profile: No results for input(s): "CHOL", "HDL", "LDLCALC", "TRIG", "CHOLHDL", "LDLDIRECT" in the last 72 hours. Thyroid Function Tests: No results for input(s): "TSH", "T4TOTAL", "FREET4", "T3FREE", "THYROIDAB" in the last 72 hours. Anemia Panel: No results for input(s): "VITAMINB12", "FOLATE", "FERRITIN", "TIBC", "IRON", "RETICCTPCT" in the last 72 hours. Sepsis Labs: No results for input(s): "PROCALCITON", "LATICACIDVEN" in the last 168 hours.  Recent Results (from the past 240 hour(s))  SARS Coronavirus 2 by RT PCR (hospital order, performed in [facility] hospital lab) *cepheid single result test* Anterior Nasal Swab  Status: None   Collection Time: 09/06/22 10:28 AM   Specimen: Anterior Nasal Swab  Result Value Ref Range Status   SARS Coronavirus 2 by RT PCR NEGATIVE NEGATIVE Final    Comment: (NOTE) SARS-CoV-2 target nucleic acids are NOT DETECTED.  The SARS-CoV-2 RNA is generally detectable in upper and lower respiratory specimens during the acute phase of infection. The lowest concentration of SARS-CoV-2 viral copies this assay can detect is 250 copies / mL. A negative result does not preclude SARS-CoV-2 infection and should not be used as the sole basis for treatment or other patient management decisions.  A negative result may occur with improper specimen collection / handling, submission of specimen other than nasopharyngeal swab, presence of viral mutation(s)  within the areas targeted by this assay, and inadequate number of viral copies (<250 copies / mL). A negative result must be combined with clinical observations, patient history, and epidemiological information.  Fact Sheet for Patients:   https://www.patel.info/  Fact Sheet for Healthcare Providers: https://hall.com/  This test is not yet approved or  cleared by the [place] FDA and has been authorized for detection and/or diagnosis of SARS-CoV-2 by FDA under an Emergency Use Authorization (EUA).  This EUA will remain in effect (meaning this test can be used) for the duration of the COVID-19 declaration under Section 564(b)(1) of the Act, 21 U.S.C. section 360bbb-3(b)(1), unless the authorization is terminated or revoked sooner.  Performed at [facility], [address] [place], [place] [address]       Radiology Studies: CT HEAD WO CONTRAST (5MM)  Result Date: 09/09/2022 CLINICAL DATA:  Provided history: Stroke, follow-up. EXAM: CT HEAD WITHOUT CONTRAST TECHNIQUE: Contiguous axial images were obtained from the base of the skull through the vertex without intravenous contrast. RADIATION DOSE REDUCTION: This exam was performed according to the departmental dose-optimization program which includes automated exposure control, adjustment of the mA and/or kV according to patient size and/or use of iterative reconstruction technique. COMPARISON:  Prior head CT examinations 09/08/2022 and earlier. FINDINGS: Brain: Mild generalized cerebral atrophy. Stable position of a right frontoparietal approach ventricular catheter, terminating within the anterior body of the right lateral ventricle (abutting the septum pellucidum). The ventricles are unchanged in size and configuration as compared to yesterday's head CT. Redemonstrated chronic lacunar infarcts within bilateral thalami. Advanced patchy and ill-defined hypoattenuation within the  cerebral white matter, nonspecific but compatible with chronic small vessel ischemic disease. There is no acute intracranial hemorrhage. No demarcated cortical infarct. No extra-axial fluid collection. No evidence of an intracranial mass. No midline shift. Vascular: No hyperdense vessel.  Atherosclerotic calcifications. Skull: No fracture or aggressive osseous lesion. Sinuses/Orbits: No mass or acute finding within the imaged orbits. No significant paranasal sinus disease at the imaged levels. IMPRESSION: No evidence of acute intracranial abnormality. Stable position of a right frontoparietal approach ventricular catheter. Unchanged size and configuration of the ventricular system. Redemonstrated chronic lacunar infarcts within bilateral thalami. Advanced chronic small vessel ischemic changes within the cerebral white matter. Electronically Signed   By: Kellie Simmering D.O.   On: 09/09/2022 09:25   CT HEAD WO CONTRAST (5MM)  Result Date: 09/08/2022 CLINICAL DATA:  Provided history: Neuro deficit, acute, stroke suspected. EXAM: CT HEAD WITHOUT CONTRAST TECHNIQUE: Contiguous axial images were obtained from the base of the skull through the vertex without intravenous contrast. RADIATION DOSE REDUCTION: This exam was performed according to the departmental dose-optimization program which includes automated exposure control, adjustment of the mA and/or kV according to patient  size and/or use of iterative reconstruction technique. COMPARISON:  Prior head CT examinations 09/06/2022 and earlier. FINDINGS: Brain: Mild generalized cerebral atrophy. Stable position of a right frontoparietal approach ventricular catheter, terminating in the right lateral ventricle (abutting the septum pellucidum). The ventricles are similar in size and configuration as compared to the recent prior head CT of 09/06/2022. Advanced patchy and ill-defined hypoattenuation within the cerebral white matter, nonspecific but compatible with chronic  small vessel ischemic disease. Redemonstrated chronic lacunar infarct within the right thalamus. There is no acute intracranial hemorrhage. No demarcated cortical infarct. No extra-axial fluid collection. No evidence of an intracranial mass. No midline shift. Vascular: No hyperdense vessel. Atherosclerotic calcifications. Skull: No fracture or aggressive osseous lesion. Sinuses/Orbits: No mass or acute finding within the imaged orbits. Minimal mucosal thickening versus tiny mucous retention cyst within the right sphenoid sinus. IMPRESSION: 1. No evidence of acute intracranial abnormality. 2. Stable position of a right frontoparietal approach ventricular catheter. The ventricles are similar in size and configuration as compared to the recent prior head CT of 09/06/2022. 3. Advanced chronic small vessel changes within the cerebral white matter. 4. Redemonstrated chronic left thalamic lacunar infarct. 5. Mild generalized cerebral atrophy. Electronically Signed   By: Kellie Simmering D.O.   On: 09/08/2022 12:53   DG Pelvis 1-2 Views  Result Date: 09/07/2022 CLINICAL DATA:  Postop right hip radiographs. EXAM: PELVIS - 1-2 VIEW COMPARISON:  09/06/2022. FINDINGS: Single AP view shows a right hip hemiarthroplasty, which appears well seated and aligned. No acute fracture or evidence of an operative complication. IMPRESSION: Well-positioned right hip hemiarthroplasty. Electronically Signed   By: Lajean Manes M.D.   On: 09/07/2022 14:14      Scheduled Meds:  amLODipine  2.5 mg Oral QHS   arformoterol  15 mcg Nebulization BID   And   umeclidinium bromide  1 puff Inhalation Daily   aspirin  81 mg Oral Daily   buPROPion  300 mg Oral Q breakfast   busPIRone  30 mg Oral QHS   busPIRone  45 mg Oral q AM   clonazePAM  0.5 mg Oral QHS   enoxaparin (LOVENOX) injection  40 mg Subcutaneous Q24H   feeding supplement  237 mL Oral BID BM   folic acid  1 mg Oral Daily   insulin aspart  0-5 Units Subcutaneous QHS   insulin  aspart  0-9 Units Subcutaneous TID WC   memantine  5 mg Oral BID   multivitamin with minerals  1 tablet Oral Q supper   rosuvastatin  20 mg Oral QHS   tamsulosin  0.4 mg Oral QPC supper   Continuous Infusions:   LOS: 3 days     Dessa Phi, DO [facility] 09/09/2022, 11:34 AM   Available via Epic secure chat 7am-7pm After these hours, please refer to coverage provider listed on [email or web address]

## 2022-09-09 NOTE — Care Management Important Message (Signed)
Important Message  Patient Details  Name: MILANI LOWENSTEIN MRN: [id number] Date of Birth: 11-Feb-1946   Medicare Important Message Given:  N/A - LOS <3 / Initial given by admissions     Juliann Pulse A Helaine Yackel 09/09/2022, 10:01 AM

## 2022-09-09 NOTE — Progress Notes (Signed)
Physical Therapy Treatment Patient Details Name: Amanda Porter MRN: [id number] DOB: [birth year] Today's Date: 09/09/2022   History of Present Illness Patient is a 76 year old female with displaced subcapital fracture of the hip s/p right hip hemiarthroplasty. History of COPD, oxygen dependent, dementia, diabetes, anemia, anxiety, CVA with left side deficits, normal pressure hydrocephalus with VP shunt, tardive dyskinesia, partial lung removal, retinal detachment right.    PT Comments    Pt received upright in bed agreeable to PT services. Pt making improvements in mobility with date with minA to transfer to EoB minA+2 to stand at bedside with RW and chair follow for bout of walking 6' with slowed gait and antalgic steps on RLE. Gait limited due to elevated HR to 128 BPM with increased WOB. Did require titration to 4L/min due to SPO2 in upper 80's with standing and gait. With seated rest and PLB pt returns to 90-91% on 3 L/min. Pt left in recliner with RN at bedside. Review of post hip precautions and LE's placed in neutral alignment to prevent hip IR and adduction past mid line. Pt progressing in mobility but d/c recs remain appropriate as she is still below baseline.     Recommendations for follow up therapy are one component of a multi-disciplinary discharge planning process, led by the attending physician.  Recommendations may be updated based on patient status, additional functional criteria and insurance authorization.  Follow Up Recommendations  Skilled nursing-short term rehab (<3 hours/day) Can patient physically be transported by private vehicle: No   Assistance Recommended at Discharge Frequent or constant Supervision/Assistance  Patient can return home with the following Two people to help with walking and/or transfers;A lot of help with bathing/dressing/bathroom;Help with stairs or ramp for entrance;Assist for transportation   Equipment Recommendations  Other (comment) (tbd by next  venue of care)    Recommendations for Other Services       Precautions / Restrictions Precautions Precautions: Fall;Posterior Hip Restrictions Weight Bearing Restrictions: Yes RLE Weight Bearing: Weight bearing as tolerated     Mobility  Bed Mobility Overal bed mobility: Needs Assistance Bed Mobility: Supine to Sit     Supine to sit: Min assist, HOB elevated     General bed mobility comments: VC's for sequencing. minA a torso to attain sitting EOB Patient Response: Cooperative  Transfers Overall transfer level: Needs assistance   Transfers: Sit to/from Stand Sit to Stand: Min assist, +2 physical assistance           General transfer comment: VC's for hand placement    Ambulation/Gait Ambulation/Gait assistance: Min guard Gait Distance (Feet): 6 Feet Assistive device: Rolling walker (2 wheels) Gait Pattern/deviations: Step-to pattern, Decreased step length - left, Decreased stance time - right       General Gait Details: ambulated 6' with cahir follow. LImited due to HR (upper 120's)   Stairs             Wheelchair Mobility    Modified Rankin (Stroke Patients Only)       Balance Overall balance assessment: Needs assistance Sitting-balance support: No upper extremity supported, Feet supported Sitting balance-Leahy Scale: Fair     Standing balance support: Bilateral upper extremity supported Standing balance-Leahy Scale: Poor Standing balance comment: Heavy UE support on RW                            Cognition Arousal/Alertness: Awake/alert Behavior During Therapy: WFL for tasks assessed/performed Overall Cognitive Status: History  of cognitive impairments - at baseline                                 General Comments: Follows single step commands with multimodal cuing and increased time. Requires re-ed on post hip precautions.        Exercises Other Exercises Other Exercises: Review of Post hip  precautions    General Comments General comments (skin integrity, edema, etc.): SPO2 at 88% on 3 L/min. Reliant on 4L/min with activity but noted desaturation to upper 80's post activity. Titrated back to 3L/min post session with SPO2 at 90%. Max HR 128 BPM with standing and short walking bout.      Pertinent Vitals/Pain Pain Assessment Pain Assessment: Faces Faces Pain Scale: Hurts a little bit Pain Location: R hip Pain Descriptors / Indicators: Dull, Discomfort Pain Intervention(s): Repositioned, Monitored during session    Home Living                          Prior Function            PT Goals (current goals can now be found in the care plan section) Acute Rehab PT Goals Patient Stated Goal: to get back home PT Goal Formulation: With patient Time For Goal Achievement: 09/22/22 Potential to Achieve Goals: Good Progress towards PT goals: Progressing toward goals    Frequency    7X/week      PT Plan Current plan remains appropriate    Co-evaluation              AM-PAC PT "6 Clicks" Mobility   Outcome Measure  Help needed turning from your back to your side while in a flat bed without using bedrails?: A Lot Help needed moving from lying on your back to sitting on the side of a flat bed without using bedrails?: A Lot Help needed moving to and from a bed to a chair (including a wheelchair)?: A Lot Help needed standing up from a chair using your arms (e.g., wheelchair or bedside chair)?: A Lot Help needed to walk in hospital room?: A Lot Help needed climbing 3-5 steps with a railing? : Total 6 Click Score: 11    End of Session Equipment Utilized During Treatment: Gait belt;Oxygen Activity Tolerance: Patient tolerated treatment well Patient left: in chair;with call bell/phone within reach;with SCD's reapplied;with nursing/sitter in room Nurse Communication: Mobility status PT Visit Diagnosis: History of falling (Z91.81);Difficulty in walking, not  elsewhere classified (R26.2);Muscle weakness (generalized) (M62.81)     Time: 1010-1031 PT Time Calculation (min) (ACUTE ONLY): 21 min  Charges:  $Gait Training: 8-22 mins                    Salem Caster. Fairly IV, PT, DPT Physical Therapist- [facility]  09/09/2022, 10:54 AM

## 2022-09-09 NOTE — TOC Progression Note (Signed)
Transition of Care [facility]) - Progression Note    Patient Details  Name: Amanda Porter MRN: [id number] Date of Birth: 25-Nov-1946  Transition of [facility]) CM/SW Stewart, RN Phone Number: 09/09/2022, 2:08 PM  Clinical Narrative:    Reached out to Area facilities and asked if they would review for a bed no offers yet        Expected Discharge Plan and Services                                                 Social Determinants of Health (SDOH) Interventions    Readmission Risk Interventions     No data to display

## 2022-09-09 NOTE — Progress Notes (Signed)
Subjective:  Patient reports pain as mild to moderate.    Objective:   VITALS:   Vitals:   09/09/22 0001 09/09/22 0449 09/09/22 0720 09/09/22 0813  BP: (!) 119/47 (!) 135/56  136/64  Pulse: 93 92  96  Resp: 20   20  Temp: 98.2 F (36.8 C)   97.8 F (36.6 C)  TempSrc:      SpO2: 99%  100% 99%  Weight:      Height:        PHYSICAL EXAM:  Neurologically intact ABD soft Neurovascular intact Sensation intact distally Intact pulses distally Dorsiflexion/Plantar flexion intact Incision: dressing C/D/I No cellulitis present Compartment soft  LABS  Results for orders placed or performed during the hospital encounter of 09/06/22 (from the past 24 hour(s))  Glucose, capillary     Status: Abnormal   Collection Time: 09/08/22 12:14 PM  Result Value Ref Range   Glucose-Capillary 245 (H) 70 - 99 mg/dL  Glucose, capillary     Status: Abnormal   Collection Time: 09/08/22  4:29 PM  Result Value Ref Range   Glucose-Capillary 204 (H) 70 - 99 mg/dL  Glucose, capillary     Status: Abnormal   Collection Time: 09/08/22 10:05 PM  Result Value Ref Range   Glucose-Capillary 161 (H) 70 - 99 mg/dL  Glucose, capillary     Status: Abnormal   Collection Time: 09/09/22  8:06 AM  Result Value Ref Range   Glucose-Capillary 127 (H) 70 - 99 mg/dL    CT HEAD WO CONTRAST (5MM)  Result Date: 09/09/2022 CLINICAL DATA:  Provided history: Stroke, follow-up. EXAM: CT HEAD WITHOUT CONTRAST TECHNIQUE: Contiguous axial images were obtained from the base of the skull through the vertex without intravenous contrast. RADIATION DOSE REDUCTION: This exam was performed according to the departmental dose-optimization program which includes automated exposure control, adjustment of the mA and/or kV according to patient size and/or use of iterative reconstruction technique. COMPARISON:  Prior head CT examinations 09/08/2022 and earlier. FINDINGS: Brain: Mild generalized cerebral atrophy. Stable position of a  right frontoparietal approach ventricular catheter, terminating within the anterior body of the right lateral ventricle (abutting the septum pellucidum). The ventricles are unchanged in size and configuration as compared to yesterday's head CT. Redemonstrated chronic lacunar infarcts within bilateral thalami. Advanced patchy and ill-defined hypoattenuation within the cerebral white matter, nonspecific but compatible with chronic small vessel ischemic disease. There is no acute intracranial hemorrhage. No demarcated cortical infarct. No extra-axial fluid collection. No evidence of an intracranial mass. No midline shift. Vascular: No hyperdense vessel.  Atherosclerotic calcifications. Skull: No fracture or aggressive osseous lesion. Sinuses/Orbits: No mass or acute finding within the imaged orbits. No significant paranasal sinus disease at the imaged levels. IMPRESSION: No evidence of acute intracranial abnormality. Stable position of a right frontoparietal approach ventricular catheter. Unchanged size and configuration of the ventricular system. Redemonstrated chronic lacunar infarcts within bilateral thalami. Advanced chronic small vessel ischemic changes within the cerebral white matter. Electronically Signed   By: Kellie Simmering D.O.   On: 09/09/2022 09:25   CT HEAD WO CONTRAST (5MM)  Result Date: 09/08/2022 CLINICAL DATA:  Provided history: Neuro deficit, acute, stroke suspected. EXAM: CT HEAD WITHOUT CONTRAST TECHNIQUE: Contiguous axial images were obtained from the base of the skull through the vertex without intravenous contrast. RADIATION DOSE REDUCTION: This exam was performed according to the departmental dose-optimization program which includes automated exposure control, adjustment of the mA and/or kV according to patient size and/or use of iterative reconstruction  technique. COMPARISON:  Prior head CT examinations 09/06/2022 and earlier. FINDINGS: Brain: Mild generalized cerebral atrophy. Stable  position of a right frontoparietal approach ventricular catheter, terminating in the right lateral ventricle (abutting the septum pellucidum). The ventricles are similar in size and configuration as compared to the recent prior head CT of 09/06/2022. Advanced patchy and ill-defined hypoattenuation within the cerebral white matter, nonspecific but compatible with chronic small vessel ischemic disease. Redemonstrated chronic lacunar infarct within the right thalamus. There is no acute intracranial hemorrhage. No demarcated cortical infarct. No extra-axial fluid collection. No evidence of an intracranial mass. No midline shift. Vascular: No hyperdense vessel. Atherosclerotic calcifications. Skull: No fracture or aggressive osseous lesion. Sinuses/Orbits: No mass or acute finding within the imaged orbits. Minimal mucosal thickening versus tiny mucous retention cyst within the right sphenoid sinus. IMPRESSION: 1. No evidence of acute intracranial abnormality. 2. Stable position of a right frontoparietal approach ventricular catheter. The ventricles are similar in size and configuration as compared to the recent prior head CT of 09/06/2022. 3. Advanced chronic small vessel changes within the cerebral white matter. 4. Redemonstrated chronic left thalamic lacunar infarct. 5. Mild generalized cerebral atrophy. Electronically Signed   By: Kellie Simmering D.O.   On: 09/08/2022 12:53   DG Pelvis 1-2 Views  Result Date: 09/07/2022 CLINICAL DATA:  Postop right hip radiographs. EXAM: PELVIS - 1-2 VIEW COMPARISON:  09/06/2022. FINDINGS: Single AP view shows a right hip hemiarthroplasty, which appears well seated and aligned. No acute fracture or evidence of an operative complication. IMPRESSION: Well-positioned right hip hemiarthroplasty. Electronically Signed   By: Lajean Manes M.D.   On: 09/07/2022 14:14    Assessment/Plan: 2 Days Post-Op   Principal Problem:   Closed displaced fracture of right femoral neck (HCC) Active  Problems:   Hyperlipidemia   Dementia without behavioral disturbance (HCC)   Dysphagia   Essential hypertension   History of CVA (cerebrovascular accident)   Anxiety and depression   Memory disturbance   Type 2 diabetes mellitus with hyperglycemia (HCC)   Hypokalemia   S/P VP shunt   DNR (do not resuscitate)   NPH (normal pressure hydrocephalus) ([place])   Multiple lung nodules   Advance diet Up with therapy Discharge to SNF when bed available Lovenov 40 mg SQ for 14 days Hydrocodone 5/325 mg as needed for pain RX in chart WBAT RLE Follow up in office for staple removal on 09/22/22 call to confirm appointment with Carlynn Spry PA-C [place] , Pa-C 09/09/2022, 9:56 AM

## 2022-09-09 NOTE — Discharge Instructions (Signed)

## 2022-09-09 NOTE — Progress Notes (Signed)
Eeg done 

## 2022-09-09 NOTE — Plan of Care (Signed)

## 2022-09-10 ENCOUNTER — Ambulatory Visit: Payer: Medicare PPO

## 2022-09-10 DIAGNOSIS — R1310 Dysphagia, unspecified: Secondary | ICD-10-CM

## 2022-09-10 DIAGNOSIS — F419 Anxiety disorder, unspecified: Secondary | ICD-10-CM

## 2022-09-10 DIAGNOSIS — S72001A Fracture of unspecified part of neck of right femur, initial encounter for closed fracture: Secondary | ICD-10-CM | POA: Diagnosis not present

## 2022-09-10 DIAGNOSIS — F0390 Unspecified dementia without behavioral disturbance: Secondary | ICD-10-CM | POA: Diagnosis not present

## 2022-09-10 DIAGNOSIS — F32A Depression, unspecified: Secondary | ICD-10-CM

## 2022-09-10 DIAGNOSIS — R918 Other nonspecific abnormal finding of lung field: Secondary | ICD-10-CM

## 2022-09-10 DIAGNOSIS — R4182 Altered mental status, unspecified: Secondary | ICD-10-CM | POA: Diagnosis not present

## 2022-09-10 LAB — GLUCOSE, CAPILLARY
Glucose-Capillary: 139 mg/dL — ABNORMAL HIGH (ref 70–99)
Glucose-Capillary: 226 mg/dL — ABNORMAL HIGH (ref 70–99)
Glucose-Capillary: 144 mg/dL — ABNORMAL HIGH (ref 70–99)
Glucose-Capillary: 131 mg/dL — ABNORMAL HIGH (ref 70–99)

## 2022-09-10 LAB — URINALYSIS, COMPLETE (UACMP) WITH MICROSCOPIC
Bacteria, UA: NONE SEEN
Bilirubin Urine: NEGATIVE
Glucose, UA: 50 mg/dL — AB
Hgb urine dipstick: NEGATIVE
Ketones, ur: 5 mg/dL — AB
Leukocytes,Ua: NEGATIVE
Nitrite: NEGATIVE
Protein, ur: NEGATIVE mg/dL
Specific Gravity, Urine: 1.034 — ABNORMAL HIGH (ref 1.005–1.030)
pH: 5 (ref 5.0–8.0)

## 2022-09-10 LAB — SURGICAL PATHOLOGY

## 2022-09-10 MED ORDER — CHLORHEXIDINE GLUCONATE CLOTH 2 % EX PADS
6.0000 | MEDICATED_PAD | Freq: Every day | CUTANEOUS | Status: DC
  Administered 2022-09-10 – 2022-09-12 (×3): 6 via TOPICAL

## 2022-09-10 NOTE — TOC Progression Note (Signed)
Transition of Care [facility]) - Progression Note    Patient Details  Name: Amanda Porter MRN: [id number] Date of Birth: Jun 08, 1946  Transition of [facility]) CM/SW [place], RN Phone Number: 09/10/2022, 3:29 PM  Clinical Narrative:     Reviewed bed offer with Daughter Ashrly, she chose [facility] I notified [place] the bed will be available tomorrow after 2 PM, Ins pending        Expected Discharge Plan and Services                                                 Social Determinants of Health (SDOH) Interventions    Readmission Risk Interventions     No data to display

## 2022-09-10 NOTE — Progress Notes (Signed)
[facility]  PROGRESS NOTE  Amanda Porter XNA:[id number] DOB: [birth year] DOA: 09/06/2022 PCP: Amanda Koch, NP   Brief HPI:   76 year old female with medical history of normal pressure hydrocephalus, s/p VP shunt placement and shunt malfunction, COPD, chronic respiratory failure, history of CVA, depression, dementia, diabetes mellitus type 2, dysphagia, dyslipidemia came to ED with frequent falls.  X-ray of right hip showed mildly displaced right femoral neck fracture.  Orthopedic surgery was consulted.  Patient underwent right hip hemiarthroplasty by Dr. Harlow Mares on 10/8.  Patient had episode of expressive aphasia on 10/9.  Neurology was consulted, CT head was unremarkable.  EEG was negative for epileptiform discharges.  Showed mild diffuse slowing.    Subjective   Patient is back to baseline, responding to questions.  Daughter at bedside is concerned that patient may have UTI as she has been getting in and out cath at home.  Has Foley catheter in place.   Assessment/Plan:    Right hip fracture s/p mechanical fall -S/p right hip hemiarthroplasty by Dr. Harlow Mares on 10/8 -PT recommending skilled nursing facility  Expressive aphasia/confusion -Patient had episode of expressive aphasia, currently resolved -Neurology was consulted, CT head was unremarkable; MRI could not be obtained as patient has VP shunt -Repeat CT head was obtained which was unremarkable -UA was checked which was abnormal; will obtain urine culture -Continue aspirin as per neurology recommendation  NPH s/p VP shunt -Patient has outpatient VP shunt surgery scheduled on 10/20  Dementia -Continue Namenda  Diabetes mellitus type 2 -Continue sliding scale insulin NovoLog -CBG well controlled  Mood disorder -Continue bupropion, BuSpar, Klonopin  Hyperlipidemia -Continue Crestor  Hypertension -Continue Norvasc  COPD with chronic hypoxemic respiratory failure -Patient wears 3 L of oxygen at  baseline -Continue oxygen therapy  Stable 12 mm solid nodule -Seen in the anterior apical region of left lung -Consider continued follow-up of chest CT in 6 months or for further characterization with PET/CT  Incomplete bladder emptying -Followed by urology, seen on 08/20/2022 -Bladder scan was normal during that visit -Recommend in and out cath as needed only -Patient daughter wanted Foley catheter so Foley cath was placed -Currently she has a Foley catheter in place       Medications     amLODipine  2.5 mg Oral QHS   arformoterol  15 mcg Nebulization BID   And   umeclidinium bromide  1 puff Inhalation Daily   aspirin  81 mg Oral Daily   buPROPion  300 mg Oral Q breakfast   busPIRone  30 mg Oral QHS   busPIRone  45 mg Oral q AM   Chlorhexidine Gluconate Cloth  6 each Topical Daily   clonazePAM  0.5 mg Oral QHS   enoxaparin (LOVENOX) injection  40 mg Subcutaneous Q24H   feeding supplement  237 mL Oral BID BM   folic acid  1 mg Oral Daily   insulin aspart  0-5 Units Subcutaneous QHS   insulin aspart  0-9 Units Subcutaneous TID WC   memantine  5 mg Oral BID   multivitamin with minerals  1 tablet Oral Q supper   rosuvastatin  20 mg Oral QHS   tamsulosin  0.4 mg Oral QPC supper     Data Reviewed:   CBG:  Recent Labs  Lab 09/09/22 1611 09/09/22 2044 09/10/22 0745 09/10/22 1234 09/10/22 1652  GLUCAP 136* 139* 139* 226* 144*    SpO2: 99 % O2 Flow Rate (L/min): 3 L/min    Vitals:   09/10/22  7366 09/10/22 0758 09/10/22 0837 09/10/22 1539  BP: 125/62  (!) 124/56 (!) 115/49  Pulse: 90  96 95  Resp: 20   16  Temp: 98.4 F (36.9 C)  98.1 F (36.7 C) 98.2 F (36.8 C)  TempSrc: Oral  Oral   SpO2: 100% 99% 97% 99%  Weight:      Height:          Data Reviewed:  Basic Metabolic Panel: Recent Labs  Lab 09/06/22 1020 09/07/22 0424  NA 141 141  K 2.8* 3.8  CL 103 107  CO2 32 30  GLUCOSE 169* 87  BUN 23 23  CREATININE 0.65 0.60  CALCIUM 9.0 8.7*   MG 2.0  --     CBC: Recent Labs  Lab 09/06/22 1020 09/07/22 0424 09/08/22 0453  WBC 16.1* 10.6* 12.7*  HGB 9.5* 8.5* 8.1*  HCT 31.5* 28.7* 26.8*  MCV 91.3 90.8 91.2  PLT 298 288 309    LFT Recent Labs  Lab 09/06/22 1020  AST 15  ALT 13  ALKPHOS 60  BILITOT 0.6  PROT 7.3  ALBUMIN 3.5     Antibiotics: Anti-infectives (From admission, onward)    Start     Dose/Rate Route Frequency Ordered Stop   09/07/22 0954  ceFAZolin (ANCEF) 2-4 GM/100ML-% IVPB       Note to Pharmacy: Mirna Mires: cabinet override      09/07/22 0954 09/07/22 1130   09/07/22 0618  ceFAZolin (ANCEF) IVPB 2g/100 mL premix        2 g 200 mL/hr over 30 Minutes Intravenous 30 min pre-op 09/07/22 0619 09/07/22 1137        DVT prophylaxis: Lovenox  Code Status: DNR  Family Communication: Discussed with patient daughter at bedside   CONSULTS neurology   Objective    Physical Examination:   General: Appears in no acute distress Cardiovascular: S1-S2, regular, no murmur auscultated Respiratory: Clear to auscultation bilaterally Abdomen: Abdomen is soft, nontender, no organomegaly Extremities: No edema in the lower extremities Neurologic: Alert, oriented x3, intact insight and judgment   Status is: Inpatient:          [name]   [facility] If 7PM-7AM, please contact night-coverage at [email or web address], Office  [phone]   09/10/2022, 6:01 PM  LOS: 4 days

## 2022-09-10 NOTE — Plan of Care (Signed)
  Problem: Education: Goal: Ability to describe self-care measures that may prevent or decrease complications (Diabetes Survival Skills Education) will improve Outcome: Progressing Goal: Individualized Educational Video(s) Outcome: Progressing   Problem: Coping: Goal: Ability to adjust to condition or change in health will improve Outcome: Progressing   Problem: Fluid Volume: Goal: Ability to maintain a balanced intake and output will improve Outcome: Progressing   Problem: Health Behavior/Discharge Planning: Goal: Ability to identify and utilize available resources and services will improve Outcome: Progressing Goal: Ability to manage health-related needs will improve Outcome: Progressing   Problem: Metabolic: Goal: Ability to maintain appropriate glucose levels will improve Outcome: Progressing   Problem: Nutritional: Goal: Maintenance of adequate nutrition will improve Outcome: Progressing Goal: Progress toward achieving an optimal weight will improve Outcome: Progressing   Problem: Skin Integrity: Goal: Risk for impaired skin integrity will decrease Outcome: Progressing   Problem: Tissue Perfusion: Goal: Adequacy of tissue perfusion will improve Outcome: Progressing   Problem: Education: Goal: Knowledge of General Education information will improve Description: Including pain rating scale, medication(s)/side effects and non-pharmacologic comfort measures Outcome: Progressing   Problem: Health Behavior/Discharge Planning: Goal: Ability to manage health-related needs will improve Outcome: Progressing   Problem: Clinical Measurements: Goal: Ability to maintain clinical measurements within normal limits will improve Outcome: Progressing Goal: Will remain free from infection Outcome: Progressing Goal: Diagnostic test results will improve Outcome: Progressing Goal: Respiratory complications will improve Outcome: Progressing Goal: Cardiovascular complication will  be avoided Outcome: Progressing   Problem: Activity: Goal: Risk for activity intolerance will decrease Outcome: Progressing   Problem: Elimination: Goal: Will not experience complications related to bowel motility Outcome: Progressing Goal: Will not experience complications related to urinary retention Outcome: Progressing   Problem: Pain Managment: Goal: General experience of comfort will improve Outcome: Progressing   Problem: Safety: Goal: Ability to remain free from injury will improve Outcome: Progressing   Problem: Skin Integrity: Goal: Risk for impaired skin integrity will decrease Outcome: Progressing

## 2022-09-10 NOTE — Care Management Important Message (Signed)
Important Message  Patient Details  Name: Amanda Porter MRN: [id number] Date of Birth: October 30, 1946   Medicare Important Message Given:  Yes     Juliann Pulse A Mckade Gurka 09/10/2022, 3:26 PM

## 2022-09-10 NOTE — Progress Notes (Signed)
Inpatient Rehab Admissions Coordinator:   Per therapy recommendations, patient was screened for CIR candidacy by Clemens Catholic, MS, CCC-SLP. At this time, Pt. does not appear to demonstrate medical necessity to justify in hospital rehabilitation/CIR. will not pursue a rehab consult for this Pt.   Recommend other rehab venues to be pursued.  Please contact me with any questions.  Clemens Catholic, [place], [place] Admissions Coordinator  [phone] ([place]) [phone] (office)

## 2022-09-10 NOTE — Progress Notes (Signed)
Subjective:  Patient reports pain as mild to moderate.    Objective:   VITALS:   Vitals:   09/09/22 2017 09/09/22 2051 09/09/22 2154 09/10/22 0341  BP:  (!) 155/70 137/66 125/62  Pulse:  (!) 117 98 90  Resp:  20 20 20   Temp:  98.8 F (37.1 C) 98.8 F (37.1 C) 98.4 F (36.9 C)  TempSrc:  Oral  Oral  SpO2: 96% 95% 100% 100%  Weight:      Height:        PHYSICAL EXAM:  Neurologically intact ABD soft Neurovascular intact Sensation intact distally Intact pulses distally Dorsiflexion/Plantar flexion intact Incision: no drainage No cellulitis present Compartment soft  LABS  Results for orders placed or performed during the hospital encounter of 09/06/22 (from the past 24 hour(s))  Glucose, capillary     Status: Abnormal   Collection Time: 09/09/22  8:06 AM  Result Value Ref Range   Glucose-Capillary 127 (H) 70 - 99 mg/dL  Glucose, capillary     Status: Abnormal   Collection Time: 09/09/22 11:46 AM  Result Value Ref Range   Glucose-Capillary 143 (H) 70 - 99 mg/dL  Glucose, capillary     Status: Abnormal   Collection Time: 09/09/22  4:11 PM  Result Value Ref Range   Glucose-Capillary 136 (H) 70 - 99 mg/dL  Glucose, capillary     Status: Abnormal   Collection Time: 09/09/22  8:44 PM  Result Value Ref Range   Glucose-Capillary 139 (H) 70 - 99 mg/dL  Glucose, capillary     Status: Abnormal   Collection Time: 09/10/22  7:45 AM  Result Value Ref Range   Glucose-Capillary 139 (H) 70 - 99 mg/dL    CT HEAD WO CONTRAST (5MM)  Result Date: 09/09/2022 CLINICAL DATA:  Provided history: Stroke, follow-up. EXAM: CT HEAD WITHOUT CONTRAST TECHNIQUE: Contiguous axial images were obtained from the base of the skull through the vertex without intravenous contrast. RADIATION DOSE REDUCTION: This exam was performed according to the departmental dose-optimization program which includes automated exposure control, adjustment of the mA and/or kV according to patient size and/or use of  iterative reconstruction technique. COMPARISON:  Prior head CT examinations 09/08/2022 and earlier. FINDINGS: Brain: Mild generalized cerebral atrophy. Stable position of a right frontoparietal approach ventricular catheter, terminating within the anterior body of the right lateral ventricle (abutting the septum pellucidum). The ventricles are unchanged in size and configuration as compared to yesterday's head CT. Redemonstrated chronic lacunar infarcts within bilateral thalami. Advanced patchy and ill-defined hypoattenuation within the cerebral white matter, nonspecific but compatible with chronic small vessel ischemic disease. There is no acute intracranial hemorrhage. No demarcated cortical infarct. No extra-axial fluid collection. No evidence of an intracranial mass. No midline shift. Vascular: No hyperdense vessel.  Atherosclerotic calcifications. Skull: No fracture or aggressive osseous lesion. Sinuses/Orbits: No mass or acute finding within the imaged orbits. No significant paranasal sinus disease at the imaged levels. IMPRESSION: No evidence of acute intracranial abnormality. Stable position of a right frontoparietal approach ventricular catheter. Unchanged size and configuration of the ventricular system. Redemonstrated chronic lacunar infarcts within bilateral thalami. Advanced chronic small vessel ischemic changes within the cerebral white matter. Electronically Signed   By: Kellie Simmering D.O.   On: 09/09/2022 09:25   CT HEAD WO CONTRAST (5MM)  Result Date: 09/08/2022 CLINICAL DATA:  Provided history: Neuro deficit, acute, stroke suspected. EXAM: CT HEAD WITHOUT CONTRAST TECHNIQUE: Contiguous axial images were obtained from the base of the skull through the vertex without  intravenous contrast. RADIATION DOSE REDUCTION: This exam was performed according to the departmental dose-optimization program which includes automated exposure control, adjustment of the mA and/or kV according to patient size and/or  use of iterative reconstruction technique. COMPARISON:  Prior head CT examinations 09/06/2022 and earlier. FINDINGS: Brain: Mild generalized cerebral atrophy. Stable position of a right frontoparietal approach ventricular catheter, terminating in the right lateral ventricle (abutting the septum pellucidum). The ventricles are similar in size and configuration as compared to the recent prior head CT of 09/06/2022. Advanced patchy and ill-defined hypoattenuation within the cerebral white matter, nonspecific but compatible with chronic small vessel ischemic disease. Redemonstrated chronic lacunar infarct within the right thalamus. There is no acute intracranial hemorrhage. No demarcated cortical infarct. No extra-axial fluid collection. No evidence of an intracranial mass. No midline shift. Vascular: No hyperdense vessel. Atherosclerotic calcifications. Skull: No fracture or aggressive osseous lesion. Sinuses/Orbits: No mass or acute finding within the imaged orbits. Minimal mucosal thickening versus tiny mucous retention cyst within the right sphenoid sinus. IMPRESSION: 1. No evidence of acute intracranial abnormality. 2. Stable position of a right frontoparietal approach ventricular catheter. The ventricles are similar in size and configuration as compared to the recent prior head CT of 09/06/2022. 3. Advanced chronic small vessel changes within the cerebral white matter. 4. Redemonstrated chronic left thalamic lacunar infarct. 5. Mild generalized cerebral atrophy. Electronically Signed   By: Kellie Simmering D.O.   On: 09/08/2022 12:53    Assessment/Plan: 3 Days Post-Op   Principal Problem:   Closed displaced fracture of right femoral neck (HCC) Active Problems:   Hyperlipidemia   Dementia without behavioral disturbance (HCC)   Dysphagia   Essential hypertension   History of CVA (cerebrovascular accident)   Anxiety and depression   Memory disturbance   Type 2 diabetes mellitus with hyperglycemia (HCC)    Hypokalemia   S/P VP shunt   DNR (do not resuscitate)   NPH (normal pressure hydrocephalus) ([place])   Multiple lung nodules   Up with therapy Discharge to SNF when bed available Lovenox 40 mg SQ for 14 days Hydrocodone 5/325 mg as needed for pain RX in chart WBAT RLE Follow up in office for staple removal on 09/22/22 call to confirm appointment with Carlynn Spry PA-C [place] , PA-C 09/10/2022, 8:00 AM

## 2022-09-10 NOTE — Procedures (Signed)
Routine EEG Report  Amanda Porter is a 76 y.o. female with a history of spells who is undergoing an EEG to evaluate for seizures.  Report: This EEG was acquired with electrodes placed according to the International 10-20 electrode system (including Fp1, Fp2, F3, F4, C3, C4, P3, P4, O1, O2, T3, T4, T5, T6, A1, A2, Fz, Cz, Pz). The following electrodes were missing or displaced: none.  The occipital dominant rhythm was 6 Hz. This activity is reactive to stimulation. Drowsiness was manifested by background fragmentation; deeper stages of sleep were identified by K complexes and sleep spindles. There was no focal slowing. There were no interictal epileptiform discharges. There were no electrographic seizures identified. Photic stimulation and hyperventilation were not performed.   Impression and clinical correlation: This EEG was obtained while awake and asleep and is abnormal due to mild diffuse slowing indicative of global cerebral dysfunction. Epileptiform abnormalities were not seen during this recording.  Su Monks, MD [facility] [phone]  If 7pm- 7am, please page neurology on call as listed in [place].

## 2022-09-10 NOTE — Progress Notes (Signed)
Physical Therapy Treatment Patient Details Name: Amanda Porter MRN: [id number] DOB: 1946/02/27 Today's Date: 09/10/2022   History of Present Illness Patient is a 76 year old female with displaced subcapital fracture of the hip s/p right hip hemiarthroplasty. History of COPD, oxygen dependent, dementia, diabetes, anemia, anxiety, CVA with left side deficits, normal pressure hydrocephalus with VP shunt, tardive dyskinesia, partial lung removal, retinal detachment right.    PT Comments    Pt was long sitting, in bed, on 3 L o2. She is alert and oriented x2 but does have cognition concerns come to light throughout session. Supportive family present throughout. Family requesting admission into inpatient rehab hospital. Author discussed limitation with this however does feel should would benefit. Per discussion with MD, PT recommendation changed to inpatient rehab. Pt had recent VP shunt and was scheduled for revision prior to this admission/fall. VP shunt replacement scheduled for next week per family. Remained on 3 L o2 throughout with sao2 dropping to 91% an HR increasing from 80s bpm to 130s bpm. Several standing rest but no complaints throughout. She will need extensive PT to return to PLOF. Will consult inpatient rehab. If unable to DC to inpatient rehab, highly recommend DC to SNF.    Recommendations for follow up therapy are one component of a multi-disciplinary discharge planning process, led by the attending physician.  Recommendations may be updated based on patient status, additional functional criteria and insurance authorization.  Follow Up Recommendations  Acute inpatient rehab (3hours/day) Can patient physically be transported by private vehicle: No   Assistance Recommended at Discharge Frequent or constant Supervision/Assistance  Patient can return home with the following A little help with walking and/or transfers;A little help with bathing/dressing/bathroom;Assistance with  cooking/housework;Assistance with feeding;Direct supervision/assist for medications management;Direct supervision/assist for financial management;Assist for transportation;Help with stairs or ramp for entrance   Equipment Recommendations  Rolling walker (2 wheels);Wheelchair (measurements PT);Wheelchair cushion (measurements PT) (pt uses rollator at baseline however current recommendation is for RW)       Precautions / Restrictions Precautions Precautions: Fall;Posterior Hip Precaution Booklet Issued: Yes (comment) Restrictions Weight Bearing Restrictions: Yes RLE Weight Bearing: Weight bearing as tolerated     Mobility  Bed Mobility Overal bed mobility: Needs Assistance Bed Mobility: Supine to Sit  Supine to sit: Min assist, Mod assist, HOB elevated  General bed mobility comments: Increased time to perform but was able to slowly progress BLEs towards EOB. Did required min-mod asssit to safely progress to EOB short sit.    Transfers Overall transfer level: Needs assistance Equipment used: Rolling walker (2 wheels) Transfers: Sit to/from Stand Sit to Stand: Min assist, From elevated surface   General transfer comment: Pt was able to stand from EOB with min assist + vcs fr handplacement and to adhere to precautions. Overall demonstarted safe abilities but not at baseline per daughter.    Ambulation/Gait Ambulation/Gait assistance: Min guard, Min assist Gait Distance (Feet): 50 Feet Assistive device: Rolling walker (2 wheels) Gait Pattern/deviations: Step-to pattern, Decreased step length - left, Decreased stance time - right Gait velocity: Varied cadence throughout ambulation.     General Gait Details: pt ambulated ~ 50 ft with RW + min assist at times. She was on 3 L o2 throughout session with HR elevating into 130s. several standing rest required due to SOB. constant vcs for safety and upright posture. SOB quickly resolved once seated and cued for PLB.    Balance Overall  balance assessment: Needs assistance Sitting-balance support: No upper extremity supported, Feet  supported Sitting balance-Leahy Scale: Fair   Standing balance support: Bilateral upper extremity supported Standing balance-Leahy Scale: Poor Standing balance comment: high fall risk during all dynamic standing activity    Cognition Arousal/Alertness: Awake/alert Behavior During Therapy: WFL for tasks assessed/performed Overall Cognitive Status: History of cognitive impairments - at baseline Area of Impairment: Orientation, Following commands, Safety/judgement, Memory, Problem solving    Following Commands: Follows one step commands consistently, Follows one step commands with increased time, Follows multi-step commands inconsistently  General Comments: Pt is alert but does have cognition concerns. Overall pleasant and motivated. Supportive family members present throughout session. They are requesting inpatient rehab  at [place].         General Comments General comments (skin integrity, edema, etc.): Issued HEP handout with pt/pt's daughter educated on proper performance and frequency. Daughter states understanding.      Pertinent Vitals/Pain Pain Assessment Pain Assessment: 0-10 Pain Score: 7  Pain Location: R hip Pain Descriptors / Indicators: Dull, Discomfort Pain Intervention(s): Limited activity within patient's tolerance, Monitored during session, Premedicated before session, Repositioned     PT Goals (current goals can now be found in the care plan section) Acute Rehab PT Goals Patient Stated Goal: get home with my daughter Progress towards PT goals: Progressing toward goals    Frequency    7X/week  PT Plan Current plan remains appropriate    Co-evaluation     PT goals addressed during session: Mobility/safety with mobility;Strengthening/ROM      AM-PAC PT "6 Clicks" Mobility   Outcome Measure  Help needed turning from your back to your side while in a flat bed  without using bedrails?: A Lot Help needed moving from lying on your back to sitting on the side of a flat bed without using bedrails?: A Lot Help needed moving to and from a bed to a chair (including a wheelchair)?: A Lot Help needed standing up from a chair using your arms (e.g., wheelchair or bedside chair)?: A Little Help needed to walk in hospital room?: A Little Help needed climbing 3-5 steps with a railing? : A Lot 6 Click Score: 14    End of Session Equipment Utilized During Treatment: Gait belt;Oxygen Activity Tolerance: Patient limited by fatigue Patient left: in chair;with call bell/phone within reach;with SCD's reapplied;with nursing/sitter in room Nurse Communication: Mobility status PT Visit Diagnosis: History of falling (Z91.81);Difficulty in walking, not elsewhere classified (R26.2);Muscle weakness (generalized) (M62.81)   Time: 3300-7622 PT Time Calculation (min) (ACUTE ONLY): 31 min  Charges:  $Gait Training: 8-22 mins $Therapeutic Exercise: 8-22 mins                    Julaine Fusi PTA 09/10/22, 1:30 PM

## 2022-09-10 NOTE — Plan of Care (Signed)
  Problem: Nutritional: Goal: Maintenance of adequate nutrition will improve Outcome: Progressing   Problem: Nutritional: Goal: Progress toward achieving an optimal weight will improve Outcome: Progressing   Problem: Skin Integrity: Goal: Risk for impaired skin integrity will decrease Outcome: Progressing   Problem: Coping: Goal: Level of anxiety will decrease Outcome: Progressing   Problem: Pain Managment: Goal: General experience of comfort will improve Outcome: Progressing   Problem: Safety: Goal: Ability to remain free from injury will improve Outcome: Progressing

## 2022-09-11 DIAGNOSIS — F419 Anxiety disorder, unspecified: Secondary | ICD-10-CM | POA: Diagnosis not present

## 2022-09-11 DIAGNOSIS — F0390 Unspecified dementia without behavioral disturbance: Secondary | ICD-10-CM | POA: Diagnosis not present

## 2022-09-11 DIAGNOSIS — Z66 Do not resuscitate: Secondary | ICD-10-CM | POA: Diagnosis not present

## 2022-09-11 DIAGNOSIS — S72001A Fracture of unspecified part of neck of right femur, initial encounter for closed fracture: Secondary | ICD-10-CM | POA: Diagnosis not present

## 2022-09-11 LAB — BASIC METABOLIC PANEL
Anion gap: 8 (ref 5–15)
BUN: 37 mg/dL — ABNORMAL HIGH (ref 8–23)
CO2: 31 mmol/L (ref 22–32)
Calcium: 8.6 mg/dL — ABNORMAL LOW (ref 8.9–10.3)
Chloride: 100 mmol/L (ref 98–111)
Creatinine, Ser: 0.72 mg/dL (ref 0.44–1.00)
GFR, Estimated: >60 mL/min (ref >60)
Glucose, Bld: 153 mg/dL — ABNORMAL HIGH (ref 70–99)
Potassium: 3.9 mmol/L (ref 3.5–5.1)
Sodium: 139 mmol/L (ref 135–145)

## 2022-09-11 LAB — CBC
HCT: 27.2 % — ABNORMAL LOW (ref 36.0–46.0)
Hemoglobin: 8.4 g/dL — ABNORMAL LOW (ref 12.0–15.0)
MCH: 27.5 pg (ref 26.0–34.0)
MCHC: 30.9 g/dL (ref 30.0–36.0)
MCV: 89.2 fL (ref 80.0–100.0)
Platelets: 366 10*3/uL (ref 150–400)
RBC: 3.05 MIL/uL — ABNORMAL LOW (ref 3.87–5.11)
RDW: 15.4 % (ref 11.5–15.5)
WBC: 12.9 10*3/uL — ABNORMAL HIGH (ref 4.0–10.5)
nRBC: 0 % (ref 0.0–0.2)

## 2022-09-11 LAB — GLUCOSE, CAPILLARY
Glucose-Capillary: 137 mg/dL — ABNORMAL HIGH (ref 70–99)
Glucose-Capillary: 227 mg/dL — ABNORMAL HIGH (ref 70–99)
Glucose-Capillary: 212 mg/dL — ABNORMAL HIGH (ref 70–99)
Glucose-Capillary: 191 mg/dL — ABNORMAL HIGH (ref 70–99)
Glucose-Capillary: 132 mg/dL — ABNORMAL HIGH (ref 70–99)

## 2022-09-11 MED ORDER — SODIUM CHLORIDE 0.9 % IV SOLN
1.0000 g | INTRAVENOUS | Status: DC
  Administered 2022-09-11: 1 g via INTRAVENOUS
  Filled 2022-09-11 (×2): qty 10

## 2022-09-11 NOTE — Plan of Care (Signed)

## 2022-09-11 NOTE — Progress Notes (Signed)
Occupational Therapy Treatment Patient Details Name: Amanda Porter MRN: [id number] DOB: 1946-06-06 Today's Date: 09/11/2022   History of present illness Patient is a 76 year old female with displaced subcapital fracture of the hip s/p right hip hemiarthroplasty. History of COPD, oxygen dependent, dementia, diabetes, anemia, anxiety, CVA with left side deficits, normal pressure hydrocephalus with VP shunt, tardive dyskinesia, partial lung removal, retinal detachment right.   OT comments  Patient in recliner upon arrival and agreeable to OT services. Patient required min A to transfer sit >stand from recliner. Patient agreeable to grooming tasks, but once standing, patient with c/o headache and SOB. Patient declined grooming tasks. Patient was able to ambulate a short distance ~15 ft to other side of bed,  requiring frequent breaks. Patient required min A to transfer EOB>supine. SP02 at 97% after activity. Patient left in bed with bell alarm set, call bell in reach and all needs met.    Recommendations for follow up therapy are one component of a multi-disciplinary discharge planning process, led by the attending physician.  Recommendations may be updated based on patient status, additional functional criteria and insurance authorization.    Follow Up Recommendations  Skilled nursing-short term rehab (<3 hours/day)    Assistance Recommended at Discharge Frequent or constant Supervision/Assistance  Patient can return home with the following  A lot of help with bathing/dressing/bathroom;Help with stairs or ramp for entrance;A little help with walking and/or transfers   Equipment Recommendations  Other (comment) (Defer to next venue of care.)    Recommendations for Other Services      Precautions / Restrictions Precautions Precautions: Posterior Hip;Fall Precaution Booklet Issued: Yes (comment) Restrictions Weight Bearing Restrictions: Yes RLE Weight Bearing: Weight bearing as tolerated        Mobility Bed Mobility   Bed Mobility: Sit to Supine       Sit to supine: Min assist        Transfers Overall transfer level: Needs assistance Equipment used: Rolling walker (2 wheels) Transfers: Sit to/from Stand Sit to Stand: Min assist                 Balance Overall balance assessment: Needs assistance Sitting-balance support: No upper extremity supported, Feet supported Sitting balance-Leahy Scale: Good     Standing balance support: Bilateral upper extremity supported Standing balance-Leahy Scale: Poor                             ADL either performed or assessed with clinical judgement   ADL                                         General ADL Comments: Patient refused due to SOB and headache    Extremity/Trunk Assessment Upper Extremity Assessment Upper Extremity Assessment: Generalized weakness   Lower Extremity Assessment Lower Extremity Assessment: Generalized weakness         Cognition Arousal/Alertness: Awake/alert Behavior During Therapy: WFL for tasks assessed/performed Overall Cognitive Status: History of cognitive impairments - at baseline                                                General Comments Pt performed several exercises in recliner prior to the conclusion of  session.    Pertinent Vitals/ Pain       Pain Assessment Pain Score: 8  Pain Location: Headache Pain Descriptors / Indicators: Headache Pain Intervention(s): Limited activity within patient's tolerance, Monitored during session   Frequency  Min 2X/week        Progress Toward Goals  OT Goals(current goals can now be found in the care plan section)  Progress towards OT goals: Progressing toward goals  Acute Rehab OT Goals Patient Stated Goal: to return home. OT Goal Formulation: With patient Time For Goal Achievement: 09/22/22 Potential to Achieve Goals: Good      Co-evaluation        PT  goals addressed during session: Mobility/safety with mobility;Balance;Proper use of DME;Strengthening/ROM        AM-PAC OT "6 Clicks" Daily Activity     Outcome Measure   Help from another person eating meals?: A Little Help from another person taking care of personal grooming?: A Little Help from another person toileting, which includes using toliet, bedpan, or urinal?: A Lot Help from another person bathing (including washing, rinsing, drying)?: A Lot Help from another person to put on and taking off regular upper body clothing?: A Little Help from another person to put on and taking off regular lower body clothing?: A Lot 6 Click Score: 15    End of Session Equipment Utilized During Treatment: Rolling walker (2 wheels)  OT Visit Diagnosis: Repeated falls (R29.6);Other abnormalities of gait and mobility (R26.89)   Activity Tolerance Patient limited by pain   Patient Left in bed;with call bell/phone within reach;with bed alarm set   Nurse Communication Mobility status        Time: 1610-9604 OT Time Calculation (min): 20 min  Charges: OT General Charges $OT Visit: 1 Visit OT Treatments $Therapeutic Activity: 8-22 mins    Osceola Holian, OTS 09/11/2022, 1:20 PM

## 2022-09-11 NOTE — Progress Notes (Signed)
Physical Therapy Treatment Patient Details Name: Amanda Porter MRN: [id number] DOB: 1946-07-02 Today's Date: 09/11/2022   History of Present Illness Patient is a 76 year old female with displaced subcapital fracture of the hip s/p right hip hemiarthroplasty. History of COPD, oxygen dependent, dementia, diabetes, anemia, anxiety, CVA with left side deficits, normal pressure hydrocephalus with VP shunt, tardive dyskinesia, partial lung removal, retinal detachment right.    PT Comments    Pt was long sitting in bed upon arriving. She is alert but still presenting with altered cognition from baseline. She was agreeable to OOB activity and PT session. Consistently able to follow commands with increased time. She needs extensive assistance to safely exit bed, stand, and ambulate short distances. More SOB/wheezing versus previous date. Highly recommend DC to SNF to address deficits while decreasing caregiver burden.    Recommendations for follow up therapy are one component of a multi-disciplinary discharge planning process, led by the attending physician.  Recommendations may be updated based on patient status, additional functional criteria and insurance authorization.  Follow Up Recommendations  Skilled nursing-short term rehab (<3 hours/day)     Assistance Recommended at Discharge Frequent or constant Supervision/Assistance  Patient can return home with the following A little help with walking and/or transfers;A little help with bathing/dressing/bathroom;Assistance with cooking/housework;Assistance with feeding;Direct supervision/assist for medications management;Direct supervision/assist for financial management;Assist for transportation;Help with stairs or ramp for entrance   Equipment Recommendations  Rolling walker (2 wheels);Wheelchair (measurements PT);Wheelchair cushion (measurements PT)       Precautions / Restrictions Precautions Precautions: Fall;Posterior Hip Precaution Booklet  Issued: Yes (comment) Restrictions Weight Bearing Restrictions: Yes RLE Weight Bearing: Weight bearing as tolerated     Mobility  Bed Mobility Overal bed mobility: Needs Assistance Bed Mobility: Supine to Sit  Supine to sit: Min assist, Mod assist, HOB elevated  General bed mobility comments: Pt required increased time + min-mod assist of one to exit R side of bed    Transfers Overall transfer level: Needs assistance Equipment used: Rolling walker (2 wheels) Transfers: Sit to/from Stand Sit to Stand: Min assist, From elevated surface   General transfer comment: Pt was able to stand from slightly elevated bed height with min assist. She did require vcs for technique improvements. Pt presents with increased SOB/wheezing versus previous date. HR elevated into 130s with minimal standing activity.    Ambulation/Gait Ambulation/Gait assistance: Min guard, Min assist Gait Distance (Feet): 3 Feet Assistive device: Rolling walker (2 wheels) Gait Pattern/deviations: Step-to pattern, Decreased step length - left, Decreased stance time - right Gait velocity: Pt had slow antalgic step to gait     General Gait Details: Pt only ambulated 3 ft this date due to SOB/fatigue   Balance Overall balance assessment: Needs assistance Sitting-balance support: No upper extremity supported, Feet supported Sitting balance-Leahy Scale: Good     Standing balance support: Bilateral upper extremity supported Standing balance-Leahy Scale: Poor Standing balance comment: high fall risk       Cognition Arousal/Alertness: Awake/alert Behavior During Therapy: WFL for tasks assessed/performed Overall Cognitive Status: History of cognitive impairments - at baseline      General Comments: Pt was alert but somewhat confused. Did follow simple commands throughout session. Pt was limited by pain, fatigue, and SOB           General Comments General comments (skin integrity, edema, etc.): Pt performed  several exercises in recliner prior to the conclusion of session.      Pertinent Vitals/Pain Pain Assessment Pain Assessment: 0-10  Pain Score: 4  Pain Location: R hip Pain Descriptors / Indicators: Dull, Discomfort Pain Intervention(s): Limited activity within patient's tolerance, Monitored during session, Premedicated before session, Repositioned, Ice applied     PT Goals (current goals can now be found in the care plan section) Acute Rehab PT Goals Patient Stated Goal: none stated Progress towards PT goals: Progressing toward goals    Frequency    7X/week      PT Plan Current plan remains appropriate    Co-evaluation     PT goals addressed during session: Mobility/safety with mobility;Balance;Proper use of DME;Strengthening/ROM        AM-PAC PT "6 Clicks" Mobility   Outcome Measure  Help needed turning from your back to your side while in a flat bed without using bedrails?: A Lot Help needed moving from lying on your back to sitting on the side of a flat bed without using bedrails?: A Lot Help needed moving to and from a bed to a chair (including a wheelchair)?: A Lot Help needed standing up from a chair using your arms (e.g., wheelchair or bedside chair)?: A Little Help needed to walk in hospital room?: A Little Help needed climbing 3-5 steps with a railing? : A Lot 6 Click Score: 14    End of Session Equipment Utilized During Treatment: Gait belt;Oxygen (3 L o2 throughout session) Activity Tolerance: Patient limited by fatigue;Patient limited by pain Patient left: in chair;with call bell/phone within reach;with SCD's reapplied;with nursing/sitter in room Nurse Communication: Mobility status PT Visit Diagnosis: History of falling (Z91.81);Difficulty in walking, not elsewhere classified (R26.2);Muscle weakness (generalized) (M62.81)     Time: 1975-8832 PT Time Calculation (min) (ACUTE ONLY): 19 min  Charges:  $Therapeutic Activity: 8-22 mins                      Julaine Fusi PTA 09/11/22, 11:57 AM

## 2022-09-11 NOTE — Plan of Care (Signed)
  Problem: Education: ?Goal: Knowledge of General Education information will improve ?Description: Including pain rating scale, medication(s)/side effects and non-pharmacologic comfort measures ?Outcome: Progressing ?  ?Problem: Health Behavior/Discharge Planning: ?Goal: Ability to manage health-related needs will improve ?Outcome: Progressing ?  ?Problem: Clinical Measurements: ?Goal: Ability to maintain clinical measurements within normal limits will improve ?Outcome: Progressing ?Goal: Will remain free from infection ?Outcome: Progressing ?Goal: Diagnostic test results will improve ?Outcome: Progressing ?Goal: Respiratory complications will improve ?Outcome: Progressing ?Goal: Cardiovascular complication will be avoided ?Outcome: Progressing ?  ?Problem: Activity: ?Goal: Risk for activity intolerance will decrease ?Outcome: Progressing ?  ?Problem: Nutrition: ?Goal: Adequate nutrition will be maintained ?Outcome: Progressing ?  ?Problem: Elimination: ?Goal: Will not experience complications related to bowel motility ?Outcome: Progressing ?Goal: Will not experience complications related to urinary retention ?Outcome: Progressing ?  ?Problem: Pain Managment: ?Goal: General experience of comfort will improve ?Outcome: Progressing ?  ?Problem: Safety: ?Goal: Ability to remain free from injury will improve ?Outcome: Progressing ?  ?Problem: Skin Integrity: ?Goal: Risk for impaired skin integrity will decrease ?Outcome: Progressing ?  ?Problem: Coping: ?Goal: Level of anxiety will decrease ?Outcome: Completed/Met ?  ?

## 2022-09-11 NOTE — Progress Notes (Signed)
[facility]  PROGRESS NOTE  Amanda Porter PJK:[id number] DOB: 07-12-1946 DOA: 09/06/2022 PCP: Pleas Koch, NP   Brief HPI:   76 year old female with medical history of normal pressure hydrocephalus, s/p VP shunt placement and shunt malfunction, COPD, chronic respiratory failure, history of CVA, depression, dementia, diabetes mellitus type 2, dysphagia, dyslipidemia came to ED with frequent falls.  X-ray of right hip showed mildly displaced right femoral neck fracture.  Orthopedic surgery was consulted.  Patient underwent right hip hemiarthroplasty by Dr. Harlow Mares on 10/8.  Patient had episode of expressive aphasia on 10/9.  Neurology was consulted, CT head was unremarkable.  EEG was negative for epileptiform discharges.  Showed mild diffuse slowing.    Subjective   Patient seen and examined, she is much more alert and interactive today.  Urine culture obtained yesterday, result is currently pending.   Assessment/Plan:    Right hip fracture s/p mechanical fall -S/p right hip hemiarthroplasty by Dr. Harlow Mares on 10/8 -PT recommending skilled nursing facility  Expressive aphasia/confusion -Patient had episode of expressive aphasia, currently resolved -Neurology was consulted, CT head was unremarkable; MRI could not be obtained as patient has VP shunt -Repeat CT head was obtained which was unremarkable -UA was checked which was abnormal; will obtain urine culture -Continue aspirin as per neurology recommendation  ?  UTI -Patient had abnormal UA yesterday, urine culture obtained -Will empirically start Rocephin -Follow urine culture results  NPH s/p VP shunt -Patient has outpatient VP shunt surgery scheduled on 10/20  Dementia -Continue Namenda  Diabetes mellitus type 2 -Continue sliding scale insulin NovoLog -CBG well controlled  Mood disorder -Continue bupropion, BuSpar, Klonopin  Hyperlipidemia -Continue Crestor  Hypertension -Continue Norvasc  COPD with  chronic hypoxemic respiratory failure -Patient wears 3 L of oxygen at baseline -Continue oxygen therapy  Stable 12 mm solid nodule -Seen in the anterior apical region of left lung -Consider continued follow-up of chest CT in 6 months or for further characterization with PET/CT  Incomplete bladder emptying -Followed by urology, seen on 08/20/2022 -Bladder scan was normal during that visit -Recommend in and out cath as needed only -Patient daughter wanted Foley catheter so Foley cath was placed -Currently she has a Foley catheter in place    Medications     amLODipine  2.5 mg Oral QHS   arformoterol  15 mcg Nebulization BID   And   umeclidinium bromide  1 puff Inhalation Daily   aspirin  81 mg Oral Daily   buPROPion  300 mg Oral Q breakfast   busPIRone  30 mg Oral QHS   busPIRone  45 mg Oral q AM   Chlorhexidine Gluconate Cloth  6 each Topical Daily   clonazePAM  0.5 mg Oral QHS   enoxaparin (LOVENOX) injection  40 mg Subcutaneous Q24H   feeding supplement  237 mL Oral BID BM   folic acid  1 mg Oral Daily   insulin aspart  0-5 Units Subcutaneous QHS   insulin aspart  0-9 Units Subcutaneous TID WC   memantine  5 mg Oral BID   multivitamin with minerals  1 tablet Oral Q supper   rosuvastatin  20 mg Oral QHS   tamsulosin  0.4 mg Oral QPC supper     Data Reviewed:   CBG:  Recent Labs  Lab 09/10/22 0745 09/10/22 1234 09/10/22 1652 09/10/22 2111 09/11/22 0757  GLUCAP 139* 226* 144* 131* 137*    SpO2: 95 % O2 Flow Rate (L/min): 3 L/min    Vitals:  09/10/22 2131 09/11/22 0017 09/11/22 0813 09/11/22 0843  BP: (!) 116/55 121/65  126/77  Pulse: 99 96  99  Resp: 16 16  17   Temp: 98.4 F (36.9 C) 98.4 F (36.9 C)  97.7 F (36.5 C)  TempSrc:      SpO2: 100% 98% 97% 95%  Weight:      Height:          Data Reviewed:  Basic Metabolic Panel: Recent Labs  Lab 09/06/22 1020 09/07/22 0424 09/11/22 0552  NA 141 141 139  K 2.8* 3.8 3.9  CL 103 107 100   CO2 32 30 31  GLUCOSE 169* 87 153*  BUN 23 23 37*  CREATININE 0.65 0.60 0.72  CALCIUM 9.0 8.7* 8.6*  MG 2.0  --   --     CBC: Recent Labs  Lab 09/06/22 1020 09/07/22 0424 09/08/22 0453 09/11/22 0552  WBC 16.1* 10.6* 12.7* 12.9*  HGB 9.5* 8.5* 8.1* 8.4*  HCT 31.5* 28.7* 26.8* 27.2*  MCV 91.3 90.8 91.2 89.2  PLT 298 288 309 366    LFT Recent Labs  Lab 09/06/22 1020  AST 15  ALT 13  ALKPHOS 60  BILITOT 0.6  PROT 7.3  ALBUMIN 3.5     Antibiotics: Anti-infectives (From admission, onward)    Start     Dose/Rate Route Frequency Ordered Stop   09/11/22 1130  cefTRIAXone (ROCEPHIN) 1 g in sodium chloride 0.9 % 100 mL IVPB        1 g 200 mL/hr over 30 Minutes Intravenous Every 24 hours 09/11/22 1034     09/07/22 0954  ceFAZolin (ANCEF) 2-4 GM/100ML-% IVPB       Note to Pharmacy: Mirna Mires: cabinet override      09/07/22 0954 09/07/22 1130   09/07/22 0618  ceFAZolin (ANCEF) IVPB 2g/100 mL premix        2 g 200 mL/hr over 30 Minutes Intravenous 30 min pre-op 09/07/22 0619 09/07/22 1137        DVT prophylaxis: Lovenox  Code Status: DNR  Family Communication: Discussed with patient daughter at bedside   CONSULTS neurology   Objective    Physical Examination:   General-appears in no acute distress Heart-S1-S2, regular, no murmur auscultated Lungs-clear to auscultation bilaterally, no wheezing or crackles auscultated Abdomen-soft, nontender, no organomegaly Extremities-no edema in the lower extremities Neuro-alert, oriented x3, no focal deficit noted  Status is: Inpatient:          [name]   [facility] If 7PM-7AM, please contact night-coverage at [email or web address], Office  [phone]   09/11/2022, 11:22 AM  LOS: 5 days

## 2022-09-11 NOTE — TOC Progression Note (Signed)
Transition of Care [facility]) - Progression Note    Patient Details  Name: Amanda Porter MRN: [id number] Date of Birth: January 20, 1946  [facility]) CM/SW [place], RN Phone Number: 09/11/2022, 2:05 PM  Clinical Narrative:     Dr said she will likely be ready to [place], I notified Miquel Skaggs and they are agreeable to wait til tomorrow I        Expected Discharge Plan and Services                                                 Social Determinants of Health (SDOH) Interventions    Readmission Risk Interventions     No data to display

## 2022-09-11 NOTE — TOC Progression Note (Addendum)
Transition of Care [facility]) - Progression Note    Patient Details  Name: Amanda Porter MRN: [id number] Date of Birth: [birth year]  Transition of Care [facility]) CM/SW [place], RN Phone Number: 09/11/2022, 1:54 PM  Clinical Narrative:     Insurncee approved to go to [facility] 10/12-10/16 [id number] ref number    Notified Miquel Burgo of the approval    Expected Discharge Plan and Services                                                 Social Determinants of Health (SDOH) Interventions    Readmission Risk Interventions     No data to display

## 2022-09-11 NOTE — Plan of Care (Signed)
  Problem: Education: Goal: Ability to describe self-care measures that may prevent or decrease complications (Diabetes Survival Skills Education) will improve Outcome: Progressing Goal: Individualized Educational Video(s) Outcome: Progressing   Problem: Coping: Goal: Ability to adjust to condition or change in health will improve Outcome: Progressing   Problem: Fluid Volume: Goal: Ability to maintain a balanced intake and output will improve Outcome: Progressing   Problem: Health Behavior/Discharge Planning: Goal: Ability to identify and utilize available resources and services will improve Outcome: Progressing Goal: Ability to manage health-related needs will improve Outcome: Progressing   Problem: Metabolic: Goal: Ability to maintain appropriate glucose levels will improve Outcome: Progressing   Problem: Nutritional: Goal: Maintenance of adequate nutrition will improve Outcome: Progressing Goal: Progress toward achieving an optimal weight will improve Outcome: Progressing   Problem: Skin Integrity: Goal: Risk for impaired skin integrity will decrease Outcome: Progressing   Problem: Tissue Perfusion: Goal: Adequacy of tissue perfusion will improve Outcome: Progressing   Problem: Education: Goal: Knowledge of General Education information will improve Description: Including pain rating scale, medication(s)/side effects and non-pharmacologic comfort measures Outcome: Progressing   Problem: Health Behavior/Discharge Planning: Goal: Ability to manage health-related needs will improve Outcome: Progressing   Problem: Clinical Measurements: Goal: Ability to maintain clinical measurements within normal limits will improve Outcome: Progressing Goal: Will remain free from infection Outcome: Progressing Goal: Diagnostic test results will improve Outcome: Progressing Goal: Respiratory complications will improve Outcome: Progressing Goal: Cardiovascular complication will  be avoided Outcome: Progressing   Problem: Activity: Goal: Risk for activity intolerance will decrease Outcome: Progressing   Problem: Nutrition: Goal: Adequate nutrition will be maintained Outcome: Progressing   Problem: Elimination: Goal: Will not experience complications related to bowel motility Outcome: Progressing Goal: Will not experience complications related to urinary retention Outcome: Progressing   Problem: Pain Managment: Goal: General experience of comfort will improve Outcome: Progressing   Problem: Safety: Goal: Ability to remain free from injury will improve Outcome: Progressing   Problem: Skin Integrity: Goal: Risk for impaired skin integrity will decrease Outcome: Progressing

## 2022-09-12 DIAGNOSIS — F419 Anxiety disorder, unspecified: Secondary | ICD-10-CM | POA: Diagnosis not present

## 2022-09-12 DIAGNOSIS — F0390 Unspecified dementia without behavioral disturbance: Secondary | ICD-10-CM | POA: Diagnosis not present

## 2022-09-12 DIAGNOSIS — Z7984 Long term (current) use of oral hypoglycemic drugs: Secondary | ICD-10-CM | POA: Diagnosis not present

## 2022-09-12 DIAGNOSIS — E1165 Type 2 diabetes mellitus with hyperglycemia: Secondary | ICD-10-CM | POA: Diagnosis present

## 2022-09-12 DIAGNOSIS — Z87891 Personal history of nicotine dependence: Secondary | ICD-10-CM | POA: Diagnosis not present

## 2022-09-12 DIAGNOSIS — T8501XA Breakdown (mechanical) of ventricular intracranial (communicating) shunt, initial encounter: Secondary | ICD-10-CM | POA: Diagnosis present

## 2022-09-12 DIAGNOSIS — Z9981 Dependence on supplemental oxygen: Secondary | ICD-10-CM | POA: Diagnosis not present

## 2022-09-12 DIAGNOSIS — S72001D Fracture of unspecified part of neck of right femur, subsequent encounter for closed fracture with routine healing: Secondary | ICD-10-CM | POA: Diagnosis not present

## 2022-09-12 DIAGNOSIS — I10 Essential (primary) hypertension: Secondary | ICD-10-CM | POA: Diagnosis present

## 2022-09-12 DIAGNOSIS — Z66 Do not resuscitate: Secondary | ICD-10-CM | POA: Diagnosis present

## 2022-09-12 DIAGNOSIS — K219 Gastro-esophageal reflux disease without esophagitis: Secondary | ICD-10-CM | POA: Diagnosis present

## 2022-09-12 DIAGNOSIS — Z794 Long term (current) use of insulin: Secondary | ICD-10-CM | POA: Diagnosis not present

## 2022-09-12 DIAGNOSIS — Z79899 Other long term (current) drug therapy: Secondary | ICD-10-CM | POA: Diagnosis not present

## 2022-09-12 DIAGNOSIS — E118 Type 2 diabetes mellitus with unspecified complications: Secondary | ICD-10-CM | POA: Diagnosis not present

## 2022-09-12 DIAGNOSIS — E876 Hypokalemia: Secondary | ICD-10-CM | POA: Diagnosis not present

## 2022-09-12 DIAGNOSIS — R339 Retention of urine, unspecified: Secondary | ICD-10-CM | POA: Diagnosis not present

## 2022-09-12 DIAGNOSIS — F0394 Unspecified dementia, unspecified severity, with anxiety: Secondary | ICD-10-CM | POA: Diagnosis present

## 2022-09-12 DIAGNOSIS — I69354 Hemiplegia and hemiparesis following cerebral infarction affecting left non-dominant side: Secondary | ICD-10-CM | POA: Diagnosis not present

## 2022-09-12 DIAGNOSIS — Z8673 Personal history of transient ischemic attack (TIA), and cerebral infarction without residual deficits: Secondary | ICD-10-CM | POA: Diagnosis not present

## 2022-09-12 DIAGNOSIS — Y838 Other surgical procedures as the cause of abnormal reaction of the patient, or of later complication, without mention of misadventure at the time of the procedure: Secondary | ICD-10-CM | POA: Diagnosis present

## 2022-09-12 DIAGNOSIS — R296 Repeated falls: Secondary | ICD-10-CM | POA: Diagnosis present

## 2022-09-12 DIAGNOSIS — T85618D Breakdown (mechanical) of other specified internal prosthetic devices, implants and grafts, subsequent encounter: Secondary | ICD-10-CM | POA: Diagnosis not present

## 2022-09-12 DIAGNOSIS — E43 Unspecified severe protein-calorie malnutrition: Secondary | ICD-10-CM | POA: Diagnosis present

## 2022-09-12 DIAGNOSIS — J449 Chronic obstructive pulmonary disease, unspecified: Secondary | ICD-10-CM | POA: Diagnosis present

## 2022-09-12 DIAGNOSIS — J440 Chronic obstructive pulmonary disease with acute lower respiratory infection: Secondary | ICD-10-CM | POA: Diagnosis not present

## 2022-09-12 DIAGNOSIS — Z743 Need for continuous supervision: Secondary | ICD-10-CM | POA: Diagnosis not present

## 2022-09-12 DIAGNOSIS — N390 Urinary tract infection, site not specified: Secondary | ICD-10-CM | POA: Diagnosis present

## 2022-09-12 DIAGNOSIS — F32A Depression, unspecified: Secondary | ICD-10-CM | POA: Diagnosis not present

## 2022-09-12 DIAGNOSIS — D75839 Thrombocytosis, unspecified: Secondary | ICD-10-CM | POA: Diagnosis present

## 2022-09-12 DIAGNOSIS — R2681 Unsteadiness on feet: Secondary | ICD-10-CM | POA: Diagnosis not present

## 2022-09-12 DIAGNOSIS — Z4789 Encounter for other orthopedic aftercare: Secondary | ICD-10-CM | POA: Diagnosis not present

## 2022-09-12 DIAGNOSIS — R41841 Cognitive communication deficit: Secondary | ICD-10-CM | POA: Diagnosis not present

## 2022-09-12 DIAGNOSIS — J9610 Chronic respiratory failure, unspecified whether with hypoxia or hypercapnia: Secondary | ICD-10-CM | POA: Diagnosis present

## 2022-09-12 DIAGNOSIS — D509 Iron deficiency anemia, unspecified: Secondary | ICD-10-CM | POA: Diagnosis not present

## 2022-09-12 DIAGNOSIS — Z888 Allergy status to other drugs, medicaments and biological substances status: Secondary | ICD-10-CM | POA: Diagnosis not present

## 2022-09-12 DIAGNOSIS — Z85118 Personal history of other malignant neoplasm of bronchus and lung: Secondary | ICD-10-CM | POA: Diagnosis not present

## 2022-09-12 DIAGNOSIS — S7291XD Unspecified fracture of right femur, subsequent encounter for closed fracture with routine healing: Secondary | ICD-10-CM | POA: Diagnosis not present

## 2022-09-12 DIAGNOSIS — G4089 Other seizures: Secondary | ICD-10-CM | POA: Diagnosis not present

## 2022-09-12 DIAGNOSIS — E119 Type 2 diabetes mellitus without complications: Secondary | ICD-10-CM | POA: Diagnosis not present

## 2022-09-12 DIAGNOSIS — S72001A Fracture of unspecified part of neck of right femur, initial encounter for closed fracture: Secondary | ICD-10-CM | POA: Diagnosis not present

## 2022-09-12 DIAGNOSIS — F445 Conversion disorder with seizures or convulsions: Secondary | ICD-10-CM | POA: Diagnosis not present

## 2022-09-12 DIAGNOSIS — R2689 Other abnormalities of gait and mobility: Secondary | ICD-10-CM | POA: Diagnosis not present

## 2022-09-12 DIAGNOSIS — F0393 Unspecified dementia, unspecified severity, with mood disturbance: Secondary | ICD-10-CM | POA: Diagnosis present

## 2022-09-12 DIAGNOSIS — R918 Other nonspecific abnormal finding of lung field: Secondary | ICD-10-CM | POA: Diagnosis not present

## 2022-09-12 DIAGNOSIS — G912 (Idiopathic) normal pressure hydrocephalus: Secondary | ICD-10-CM | POA: Diagnosis present

## 2022-09-12 DIAGNOSIS — D649 Anemia, unspecified: Secondary | ICD-10-CM | POA: Diagnosis present

## 2022-09-12 DIAGNOSIS — Z681 Body mass index (BMI) 19 or less, adult: Secondary | ICD-10-CM | POA: Diagnosis not present

## 2022-09-12 DIAGNOSIS — E785 Hyperlipidemia, unspecified: Secondary | ICD-10-CM | POA: Diagnosis present

## 2022-09-12 DIAGNOSIS — M6281 Muscle weakness (generalized): Secondary | ICD-10-CM | POA: Diagnosis not present

## 2022-09-12 DIAGNOSIS — R278 Other lack of coordination: Secondary | ICD-10-CM | POA: Diagnosis not present

## 2022-09-12 LAB — URINE CULTURE: Culture: NO GROWTH

## 2022-09-12 LAB — GLUCOSE, CAPILLARY
Glucose-Capillary: 148 mg/dL — ABNORMAL HIGH (ref 70–99)
Glucose-Capillary: 204 mg/dL — ABNORMAL HIGH (ref 70–99)

## 2022-09-12 MED ORDER — CLONAZEPAM 0.5 MG PO TABS: 0.5000 mg | ORAL_TABLET | Freq: Every day | ORAL | 0 refills | Status: DC

## 2022-09-12 MED ORDER — INSULIN ASPART 100 UNIT/ML IJ SOLN: 0.0000 [IU] | Freq: Three times a day (TID) | INTRAMUSCULAR | 11 refills | Status: DC

## 2022-09-12 MED ORDER — ASPIRIN 81 MG PO CHEW: 81.0000 mg | CHEWABLE_TABLET | Freq: Every day | ORAL | Status: DC

## 2022-09-12 NOTE — Discharge Summary (Addendum)
Physician Discharge Summary   Patient: Amanda Porter MRN: [id number] DOB: Jul 15, 1946  Admit date:     09/06/2022  Discharge date: 09/12/22  Discharge Physician: [name]   PCP: Pleas Koch, NP   Recommendations at discharge:   Follow-up orthopedics on 10/23, call for appointment for Carlynn Spry Continue taking Lovenox 40 mg subcu daily for 13 days for DVT prophylaxis Lung nodule-seen on CT chest, consider repeat CT chest in 6 months as outpatient  Discharge Diagnoses: Principal Problem:   Closed displaced fracture of right femoral neck ([place]) Active Problems:   NPH (normal pressure hydrocephalus) (HCC)   Hypokalemia   Dementia without behavioral disturbance (HCC)   S/P VP shunt   Type 2 diabetes mellitus with hyperglycemia (HCC)   Anxiety and depression   History of CVA (cerebrovascular accident)   Hyperlipidemia   Dysphagia   Essential hypertension   Memory disturbance   DNR (do not resuscitate)   Multiple lung nodules  Resolved Problems:   Right femoral fracture [facility])  Hospital Course:  76 year old female with medical history of normal pressure hydrocephalus, s/p VP shunt placement and shunt malfunction, COPD, chronic respiratory failure, history of CVA, depression, dementia, diabetes mellitus type 2, dysphagia, dyslipidemia came to ED with frequent falls.  X-ray of right hip showed mildly displaced right femoral neck fracture.  Orthopedic surgery was consulted.  Patient underwent right hip hemiarthroplasty by Dr. Harlow Mares on 10/8.   Patient had episode of expressive aphasia on 10/9.  Neurology was consulted, CT head was unremarkable.  EEG was negative for epileptiform discharges.  Showed mild diffuse slowing  Assessment and Plan:  * Closed displaced fracture of right femoral neck ([place]) - Presumed secondary to mechanical fall in patient with normal pressure hydrocephalus and a malfunctioning VP shunt - Fall precautions - Orthopedic service has been consulted  and plans for OR tomorrow on 09/07/2022 - Pain control: Hydrocodone-acetaminophen 5-3 25, 1 to 2 tablets p.o. every 6 hours as needed for moderate pain; morphine 1 mg IV every 2 hours as needed for severe pain - Admit to MedSurg, inpatient  Hypokalemia - Status post potassium chloride 40 mill equivalent one-time dose and potassium chloride 10 mill equivalent IV, 2 doses ordered - Magnesium is 2.0 on admission - Ordered additional potassium chloride 20 mill equivalent nightly, one-time dose ordered - BMP in the a.m.  Dementia without behavioral disturbance (HCC) - Memantine 5 mg twice daily resumed  S/P VP shunt - VP shunt is currently malfunctions - Continue follow-up with neuro surgery for revision as appropriate - When patient fell 3 to 4 days ago, she did hit her head at the exact area where they need to go in to perform the revision therefore Dr. Cari Caraway states he will defer the revision until the wound heals completely due to possible risk of infection  Type 2 diabetes mellitus with hyperglycemia ([place]) - Home glipizide has been held on admission - Insulin SSI with at bedtime coverage ordered - Goal inpatient blood glucose levels 140-180  Anxiety and depression - Resume home bupropion 300 mg daily with breakfast, buspirone 45 milligrams every morning and 30 mg nightly resumed  History of CVA (cerebrovascular accident) - Rosuvastatin 20 mg nightly resumed  DNR (do not resuscitate) - Confirmed with both patient and daughter, Daviona Herbert  Memory disturbance - Memantine 5 mg p.o. twice daily resumed  Essential hypertension - Pain takes amlodipine 2.5 mg p.o. nightly, resumed - Hydralazine 10 mg p.o. every 6 hours as needed for SBP  greater than 180, 4 days of  Hyperlipidemia - Rosuvastatin 20 mg daily resumed   Right hip fracture s/p mechanical fall -S/p right hip hemiarthroplasty by Dr. Harlow Mares on 10/8 -PT recommending skilled nursing facility -Patient to discharge to  skilled nursing facility today   Expressive aphasia/confusion -Patient had episode of expressive aphasia, currently resolved -Neurology was consulted, CT head was unremarkable; MRI could not be obtained as patient has VP shunt -EEG was negative for epileptiform discharges -Repeat CT head was obtained which was unremarkable -UA was checked which was abnormal;  urine culture showed no growth -We will continue antibiotics -Continue aspirin as per neurology recommendation   NPH s/p VP shunt -Patient has outpatient VP shunt surgery scheduled on 10/20   Dementia -Continue Namenda   Diabetes mellitus type 2 -Continue sliding scale insulin NovoLog -CBG well controlled -Continue Glucotrol   Mood disorder -Continue bupropion, BuSpar, Klonopin   Hyperlipidemia -Continue Crestor   Hypertension -Continue Norvasc   COPD with chronic hypoxemic respiratory failure -Patient wears 3 L of oxygen at baseline -Continue oxygen therapy   Stable 12 mm solid nodule -Seen in the anterior apical region of left lung -Consider continued follow-up of chest CT in 6 months or for further characterization with PET/CT -Called and discussed with patient daughter on phone   Incomplete bladder emptying -Followed by urology, seen on 08/20/2022 -Bladder scan was normal during that visit -Recommend in and out cath as needed only -Patient daughter wanted Foley catheter so Foley cath was placed -Currently she has a Foley catheter in place      Consultants: Orthopedics, neurology Procedures performed: Hip surgery, EEG Disposition: Skilled nursing facility Diet recommendation:  Discharge Diet Orders (From admission, onward)     Start     Ordered   09/12/22 0000  Diet - low sodium heart healthy        09/12/22 1050           Carb modified diet DISCHARGE MEDICATION: Allergies as of 09/12/2022       Reactions   Lexapro [escitalopram Oxalate] Other (See Comments)   Dizziness, nausea, fatigue    Neosporin  [neomycin-polymyxin-gramicidin] Rash   (Neosporin)   Neosporin [bacitracin-polymyxin B] Rash        Medication List     STOP taking these medications    acetaminophen 500 MG tablet Commonly known as: TYLENOL   Spiriva Respimat 1.25 MCG/ACT Aers Generic drug: Tiotropium Bromide Monohydrate       TAKE these medications    Accu-Chek Softclix Lancets lancets Use as instructed   albuterol (2.5 MG/3ML) 0.083% nebulizer solution Commonly known as: PROVENTIL USE 1 VIAL IN NEBULIZER EVERY 6 HOURS What changed: See the new instructions.   albuterol 108 (90 Base) MCG/ACT inhaler Commonly known as: VENTOLIN HFA INHALE 2 PUFFS BY MOUTH EVERY 4 HOURS AS NEEDED What changed: Another medication with the same name was changed. Make sure you understand how and when to take each.   amLODipine 2.5 MG tablet Commonly known as: NORVASC Take 1 tablet (2.5 mg total) by mouth daily. for blood pressure. What changed: when to take this   aspirin 81 MG chewable tablet Chew 1 tablet (81 mg total) by mouth daily. Start taking on: September 13, 2022   buPROPion 300 MG 24 hr tablet Commonly known as: WELLBUTRIN XL Take 1 tablet (300 mg total) by mouth daily with breakfast.   busPIRone 15 MG tablet Commonly known as: BUSPAR Take 45 mg by mouth in the morning.   busPIRone  30 MG tablet Commonly known as: BUSPAR Take 1 tablet (30 mg total) by mouth at bedtime.   clonazePAM 0.5 MG tablet Commonly known as: KLONOPIN Take 1 tablet (0.5 mg total) by mouth at bedtime.   enoxaparin 40 MG/0.4ML injection Commonly known as: LOVENOX Inject 0.4 mLs (40 mg total) into the skin daily for 13 days.   folic acid 1 MG tablet Commonly known as: FOLVITE Take 1 tablet (1 mg total) by mouth daily.   glipiZIDE 2.5 MG 24 hr tablet Commonly known as: GLUCOTROL XL TAKE 1 TABLET (2.5 MG TOTAL) BY MOUTH DAILY WITH BREAKFAST. FOR DIABETES.   glucose blood test strip Use as instructed    HYDROcodone-acetaminophen 5-325 MG tablet Commonly known as: NORCO/VICODIN Take 1 tablet by mouth every 4 (four) hours as needed for moderate pain (pain score 4-6).   insulin aspart 100 UNIT/ML injection Commonly known as: novoLOG Inject 0-9 Units into the skin 3 (three) times daily with meals. Sliding scale insulin Less than 70 initiate hypoglycemia protocol 70-120  0 units 120-150 1 unit 151-200 2 units 201-250 3 units 251-300 5 units 301-350 7 units 351-400 9 units Greater than 400 call MD   memantine 5 MG tablet Commonly known as: NAMENDA Take 1 tablet (5 mg total) by mouth 2 (two) times daily. For memory.   Premarin vaginal cream Generic drug: conjugated estrogens Apply one pea-sized amount around the opening of the urethra daily for 2 weeks, then 3 times weekly moving forward.   rosuvastatin 20 MG tablet Commonly known as: CRESTOR TAKE 1 TABLET (20 MG TOTAL) BY MOUTH EVERY EVENING. FOR CHOLESTEROL.   Stiolto Respimat 2.5-2.5 MCG/ACT Aers Generic drug: Tiotropium Bromide-Olodaterol INHALE 2 PUFFS BY MOUTH INTO THE LUNGS DAILY What changed: See the new instructions.   tamsulosin 0.4 MG Caps capsule Commonly known as: FLOMAX Take 1 capsule (0.4 mg total) by mouth daily. For urine retention What changed: when to take this        Contact information for follow-up providers     Carlynn Spry, PA-C. Schedule an appointment as soon as possible for a visit on 09/22/2022.   Specialty: Orthopedic Surgery Contact information: [address] [place] [address] [phone]              Contact information for after-discharge care     Destination     [facility] .   Service: Skilled Nursing Contact information: [address] [place] [phone]                    Discharge Exam: Danley Danker Weights   09/06/22 1006  Weight: 47 kg   General-appears in no acute distress Heart-S1-S2, regular, no  murmur auscultated Lungs-clear to auscultation bilaterally, no wheezing or crackles auscultated Abdomen-soft, nontender, no organomegaly Extremities-no edema in the lower extremities Neuro-alert, oriented x3, no focal deficit noted  Condition at discharge: good  The results of significant diagnostics from this hospitalization (including imaging, microbiology, ancillary and laboratory) are listed below for reference.   Imaging Studies: EEG adult  Result Date: 09/13/2022 Derek Jack, MD     2022-09-13  2:13 PM Routine EEG Report Arieona A Mortellaro is a 76 y.o. female with a history of spells who is undergoing an EEG to evaluate for seizures. Report: This EEG was acquired with electrodes placed according to the International 10-20 electrode system (including Fp1, Fp2, F3, F4, C3, C4, P3, P4, O1, O2, T3, T4, T5, T6, A1, A2,  Fz, Cz, Pz). The following electrodes were missing or displaced: none. The occipital dominant rhythm was 6 Hz. This activity is reactive to stimulation. Drowsiness was manifested by background fragmentation; deeper stages of sleep were identified by K complexes and sleep spindles. There was no focal slowing. There were no interictal epileptiform discharges. There were no electrographic seizures identified. Photic stimulation and hyperventilation were not performed. Impression and clinical correlation: This EEG was obtained while awake and asleep and is abnormal due to mild diffuse slowing indicative of global cerebral dysfunction. Epileptiform abnormalities were not seen during this recording. Su Monks, MD [facility] [phone] If 7pm- 7am, please page neurology on call as listed in [place].   CT HEAD WO CONTRAST (5MM)  Result Date: 09/09/2022 CLINICAL DATA:  Provided history: Stroke, follow-up. EXAM: CT HEAD WITHOUT CONTRAST TECHNIQUE: Contiguous axial images were obtained from the base of the skull through the vertex without intravenous contrast. RADIATION DOSE  REDUCTION: This exam was performed according to the departmental dose-optimization program which includes automated exposure control, adjustment of the mA and/or kV according to patient size and/or use of iterative reconstruction technique. COMPARISON:  Prior head CT examinations 09/08/2022 and earlier. FINDINGS: Brain: Mild generalized cerebral atrophy. Stable position of a right frontoparietal approach ventricular catheter, terminating within the anterior body of the right lateral ventricle (abutting the septum pellucidum). The ventricles are unchanged in size and configuration as compared to yesterday's head CT. Redemonstrated chronic lacunar infarcts within bilateral thalami. Advanced patchy and ill-defined hypoattenuation within the cerebral white matter, nonspecific but compatible with chronic small vessel ischemic disease. There is no acute intracranial hemorrhage. No demarcated cortical infarct. No extra-axial fluid collection. No evidence of an intracranial mass. No midline shift. Vascular: No hyperdense vessel.  Atherosclerotic calcifications. Skull: No fracture or aggressive osseous lesion. Sinuses/Orbits: No mass or acute finding within the imaged orbits. No significant paranasal sinus disease at the imaged levels. IMPRESSION: No evidence of acute intracranial abnormality. Stable position of a right frontoparietal approach ventricular catheter. Unchanged size and configuration of the ventricular system. Redemonstrated chronic lacunar infarcts within bilateral thalami. Advanced chronic small vessel ischemic changes within the cerebral white matter. Electronically Signed   By: Kellie Simmering D.O.   On: 09/09/2022 09:25   CT HEAD WO CONTRAST (5MM)  Result Date: 09/08/2022 CLINICAL DATA:  Provided history: Neuro deficit, acute, stroke suspected. EXAM: CT HEAD WITHOUT CONTRAST TECHNIQUE: Contiguous axial images were obtained from the base of the skull through the vertex without intravenous contrast.  RADIATION DOSE REDUCTION: This exam was performed according to the departmental dose-optimization program which includes automated exposure control, adjustment of the mA and/or kV according to patient size and/or use of iterative reconstruction technique. COMPARISON:  Prior head CT examinations 09/06/2022 and earlier. FINDINGS: Brain: Mild generalized cerebral atrophy. Stable position of a right frontoparietal approach ventricular catheter, terminating in the right lateral ventricle (abutting the septum pellucidum). The ventricles are similar in size and configuration as compared to the recent prior head CT of 09/06/2022. Advanced patchy and ill-defined hypoattenuation within the cerebral white matter, nonspecific but compatible with chronic small vessel ischemic disease. Redemonstrated chronic lacunar infarct within the right thalamus. There is no acute intracranial hemorrhage. No demarcated cortical infarct. No extra-axial fluid collection. No evidence of an intracranial mass. No midline shift. Vascular: No hyperdense vessel. Atherosclerotic calcifications. Skull: No fracture or aggressive osseous lesion. Sinuses/Orbits: No mass or acute finding within the imaged orbits. Minimal mucosal thickening versus tiny mucous retention cyst within the right sphenoid sinus.  IMPRESSION: 1. No evidence of acute intracranial abnormality. 2. Stable position of a right frontoparietal approach ventricular catheter. The ventricles are similar in size and configuration as compared to the recent prior head CT of 09/06/2022. 3. Advanced chronic small vessel changes within the cerebral white matter. 4. Redemonstrated chronic left thalamic lacunar infarct. 5. Mild generalized cerebral atrophy. Electronically Signed   By: Kellie Simmering D.O.   On: 09/08/2022 12:53   DG Pelvis 1-2 Views  Result Date: 09/07/2022 CLINICAL DATA:  Postop right hip radiographs. EXAM: PELVIS - 1-2 VIEW COMPARISON:  09/06/2022. FINDINGS: Single AP view shows a  right hip hemiarthroplasty, which appears well seated and aligned. No acute fracture or evidence of an operative complication. IMPRESSION: Well-positioned right hip hemiarthroplasty. Electronically Signed   By: Lajean Manes M.D.   On: 09/07/2022 14:14   CT Angio Chest PE W and/or Wo Contrast  Result Date: 09/06/2022 CLINICAL DATA:  Worsening shortness of breath and hypoxia for 1 week. High probability for pulmonary embolism. EXAM: CT ANGIOGRAPHY CHEST WITH CONTRAST TECHNIQUE: Multidetector CT imaging of the chest was performed using the standard protocol during bolus administration of intravenous contrast. Multiplanar CT image reconstructions and MIPs were obtained to evaluate the vascular anatomy. RADIATION DOSE REDUCTION: This exam was performed according to the departmental dose-optimization program which includes automated exposure control, adjustment of the mA and/or kV according to patient size and/or use of iterative reconstruction technique. CONTRAST:  74mL OMNIPAQUE IOHEXOL 350 MG/ML SOLN COMPARISON:  Noncontrast chest CT on 08/27/2022 [facility], and [facility]/14/2022 from [facility] FINDINGS: Cardiovascular: Satisfactory opacification of pulmonary arteries noted, and no pulmonary emboli identified. No evidence of thoracic aortic dissection or aneurysm. Aortic and coronary atherosclerotic calcification incidentally noted. Moderate to severe left ventricular hypertrophy also noted. Mediastinum/Nodes: No masses or pathologically enlarged lymph nodes identified. Lungs/Pleura: Moderate centrilobular emphysema again noted. Postop changes are again seen from prior left upper lobe resection. A solid nodule is again seen in the anterior apical region of the left lung which measures 12 x 7 mm on image 36/5. This remains stable compared to previous studies. Few other tiny less than 5 mm pulmonary nodules in the peripheral left lung also remain stable. Upper abdomen: Cholelithiasis  again noted. Musculoskeletal: No suspicious bone lesions identified. Review of the MIP images confirms the above findings. IMPRESSION: No evidence of pulmonary embolism. Stable 12 mm solid nodule in the anterior apical region of the left lung. Consider continued follow-up by chest CT in 6 months, or further characterization with PET-CT. Cholelithiasis incidentally noted. Aortic Atherosclerosis (ICD10-I70.0) and Emphysema (ICD10-J43.9). Electronically Signed   By: Marlaine Hind M.D.   On: 09/06/2022 12:24   CT HEAD WO CONTRAST (5MM)  Result Date: 09/06/2022 CLINICAL DATA:  Four VP shunt revision yesterday. Procedure canceled because patient fell. Patient has a right hip fracture. EXAM: CT HEAD WITHOUT CONTRAST CT CERVICAL SPINE WITHOUT CONTRAST TECHNIQUE: Multidetector CT imaging of the head and cervical spine was performed following the standard protocol without intravenous contrast. Multiplanar CT image reconstructions of the cervical spine were also generated. RADIATION DOSE REDUCTION: This exam was performed according to the departmental dose-optimization program which includes automated exposure control, adjustment of the mA and/or kV according to patient size and/or use of iterative reconstruction technique. COMPARISON:  07/29/2022. FINDINGS: CT HEAD FINDINGS Brain: No evidence of acute infarction, hemorrhage, extra-axial collection or mass lesion/mass effect. There is ventricular enlargement that has slightly increased compared to the prior CT. Right frontal ventriculostomy  catheter tip projects adjacent to the septum pellucidum, unchanged. Patchy bilateral white matter hypoattenuation is noted consistent with moderate to advanced chronic microvascular ischemic change. Old right thalamic lacunar infarct. Vascular: No hyperdense vessel or unexpected calcification. Skull: Normal. Negative for fracture or focal lesion. Sinuses/Orbits: Globes and orbits are unremarkable. Visualized sinuses are clear. Other:  None. CT CERVICAL SPINE FINDINGS Alignment: Mild reversal the normal cervical lordosis, apex at C6. No spondylolisthesis. Skull base and vertebrae: No acute fracture. No primary bone lesion or focal pathologic process. Soft tissues and spinal canal: No prevertebral fluid or swelling. No visible canal hematoma. Disc levels: Moderate loss of disc height at C5-C6 and C6-C7 with mild disc bulging and endplate spurring. No convincing disc herniation. Upper chest: No acute findings. Left upper lobe nodule, 11 x 7 mm, mean 9 mm. Advanced emphysema in the upper lobes. Other: None. IMPRESSION: HEAD CT 1. Ventricles are slightly larger than on the prior CT. This would support shunt dysfunction. 2. No other change or acute intracranial abnormality. 3. Moderate to advanced chronic microvascular ischemic change. CERVICAL CT 1. No fracture or acute finding. 2. 9 mm left upper lobe nodule. Patient is undergoing a chest CTA. Please refer to this study for further details. Electronically Signed   By: Lajean Manes M.D.   On: 09/06/2022 12:14   CT Cervical Spine Wo Contrast  Result Date: 09/06/2022 CLINICAL DATA:  Four VP shunt revision yesterday. Procedure canceled because patient fell. Patient has a right hip fracture. EXAM: CT HEAD WITHOUT CONTRAST CT CERVICAL SPINE WITHOUT CONTRAST TECHNIQUE: Multidetector CT imaging of the head and cervical spine was performed following the standard protocol without intravenous contrast. Multiplanar CT image reconstructions of the cervical spine were also generated. RADIATION DOSE REDUCTION: This exam was performed according to the departmental dose-optimization program which includes automated exposure control, adjustment of the mA and/or kV according to patient size and/or use of iterative reconstruction technique. COMPARISON:  07/29/2022. FINDINGS: CT HEAD FINDINGS Brain: No evidence of acute infarction, hemorrhage, extra-axial collection or mass lesion/mass effect. There is ventricular  enlargement that has slightly increased compared to the prior CT. Right frontal ventriculostomy catheter tip projects adjacent to the septum pellucidum, unchanged. Patchy bilateral white matter hypoattenuation is noted consistent with moderate to advanced chronic microvascular ischemic change. Old right thalamic lacunar infarct. Vascular: No hyperdense vessel or unexpected calcification. Skull: Normal. Negative for fracture or focal lesion. Sinuses/Orbits: Globes and orbits are unremarkable. Visualized sinuses are clear. Other: None. CT CERVICAL SPINE FINDINGS Alignment: Mild reversal the normal cervical lordosis, apex at C6. No spondylolisthesis. Skull base and vertebrae: No acute fracture. No primary bone lesion or focal pathologic process. Soft tissues and spinal canal: No prevertebral fluid or swelling. No visible canal hematoma. Disc levels: Moderate loss of disc height at C5-C6 and C6-C7 with mild disc bulging and endplate spurring. No convincing disc herniation. Upper chest: No acute findings. Left upper lobe nodule, 11 x 7 mm, mean 9 mm. Advanced emphysema in the upper lobes. Other: None. IMPRESSION: HEAD CT 1. Ventricles are slightly larger than on the prior CT. This would support shunt dysfunction. 2. No other change or acute intracranial abnormality. 3. Moderate to advanced chronic microvascular ischemic change. CERVICAL CT 1. No fracture or acute finding. 2. 9 mm left upper lobe nodule. Patient is undergoing a chest CTA. Please refer to this study for further details. Electronically Signed   By: Lajean Manes M.D.   On: 09/06/2022 12:14   DG Abd 1 View  Result  Date: 09/06/2022 CLINICAL DATA:  Pt daughter reports pt was scheduled for a ventriculoperitoneal shunt revision yesterday but the surgery was cancelled because she fell and hit her head on the same site as the shunt. Pt daughter reports she also hurt her right leg and then fell again this am around 4am and now has more severe pain in her right  leg around her hip. Pt daughter reports she is concerned that she broke something. Pt daughter also reports pt has been increasingly SOB over the past week. Pt is on 2L of O2 at all times but gets winded and her sats drop when she walks. No fever, cough or pain. EXAM: ABDOMEN - 1 VIEW COMPARISON:  07/25/2022 FINDINGS: Right sided ventriculoperitoneal shunt catheter extends into the pelvis, unchanged from the prior exam and intact. Normal bowel gas pattern. Scattered arterial vascular calcifications. Soft tissues otherwise unremarkable. Acute right femoral neck fracture as detailed under the right hip radiographs. IMPRESSION: 1. Intact visualized portion of the ventriculoperitoneal catheter. Electronically Signed   By: Lajean Manes M.D.   On: 09/06/2022 11:04   DG Cervical Spine 1 View  Result Date: 09/06/2022 CLINICAL DATA:  Pt daughter reports pt was scheduled for a ventriculoperitoneal shunt revision yesterday but the surgery was cancelled because she fell and hit her head on the same site as the shunt. Pt daughter reports she also hurt her right leg and then fell again this am around 4am and now has more severe pain in her right leg around her hip. Pt daughter reports she is concerned that she broke something. Pt daughter also reports pt has been increasingly SOB over the past week. Pt is on 2L of O2 at all times but gets winded and her sats drop when she walks. No fever, cough or pain. EXAM: DG CERVICAL SPINE - 1 VIEW COMPARISON:  None Available. FINDINGS: Right sided ventriculostomy catheter is intact as it extends from the skull base to the upper chest. No fracture or acute finding. IMPRESSION: Intact visualized portion of the right neck VP shunt catheter. Electronically Signed   By: Lajean Manes M.D.   On: 09/06/2022 11:03   DG Skull 1-3 Views  Result Date: 09/06/2022 CLINICAL DATA:  Pt daughter reports pt was scheduled for a ventriculoperitoneal shunt revision yesterday but the surgery was  cancelled because she fell and hit her head on the same site as the shunt. Pt daughter reports she also hurt her right leg and then fell again this am around 4am and now has more severe pain in her right leg around her hip. Pt daughter reports she is concerned that she broke something. Pt daughter also reports pt has been increasingly SOB over the past week. Pt is on 2L of O2 at all times but gets winded and her sats drop when she walks. No fever, cough or pain. EXAM: SKULL - 1-3 VIEW COMPARISON:  Head CT, 07/29/2022. FINDINGS: Right parietal shunt catheter, ventriculostomy projecting centrally, unchanged from the prior head CT. Visualized portion of the ventriculoperitoneal shunt is intact. No skull fracture or bone lesion. IMPRESSION: Stable appearance of the right ventriculostomy catheter. Visualized portion the shunt catheter is intact. Electronically Signed   By: Lajean Manes M.D.   On: 09/06/2022 11:02   DG Chest 2 View  Result Date: 09/06/2022 CLINICAL DATA:  Pt daughter reports pt was scheduled for a ventriculoperitoneal shunt revision yesterday but the surgery was cancelled because she fell and hit her head on the same site as the shunt.  Pt daughter reports she also hurt her right leg and then fell again this am around 4am and now has more severe pain in her right leg around her hip. Pt daughter reports she is concerned that she broke something. Pt daughter also reports pt has been increasingly SOB over the past week. Pt is on 2L of O2 at all times but gets winded and her sats drop when she walks. No fever, cough or pain. EXAM: CHEST - 2 VIEW COMPARISON:  07/24/2022.  CT, 08/27/2022. FINDINGS: Cardiac silhouette is normal in size. No mediastinal or hilar masses. Postsurgical changes with vascular clips overlying the AP window/superior left hilum, stable. Lungs are hyperexpanded. There is relative volume loss on the left. Bilateral interstitial thickening, most evident in the lower lungs. Lucency in the  upper lobes consistent with emphysema. No evidence of pneumonia or pulmonary edema. No pleural effusion or pneumothorax. Right anterior chest wall ventriculoperitoneal shunt catheter is intact. Skeletal structures are demineralized, grossly intact. IMPRESSION: 1. No acute cardiopulmonary disease. 2. Advanced COPD and chronic changes from prior left lung surgery. Electronically Signed   By: Lajean Manes M.D.   On: 09/06/2022 11:00   DG Hip Unilat W or Wo Pelvis 2-3 Views Right  Result Date: 09/06/2022 CLINICAL DATA:  Patient fell are yesterday and hit her head. She was scheduled for VP shunt revision. EXAM: DG HIP (WITH OR WITHOUT PELVIS) 2-3V RIGHT COMPARISON:  None Available. FINDINGS: Subcapital right femoral neck fracture, non comminuted, mildly displaced, distal fracture component displacing superiorly by 1.5 cm, with Verus angulation. No other fractures.  No bone lesions. Hip joints, SI joints and pubic symphysis are normally aligned. Distal aspect of an intact ventriculoperitoneal shunt curls in the pelvis. IMPRESSION: 1. Mildly displaced, varus angulated subcapital right femoral neck fracture. No dislocation. Electronically Signed   By: Lajean Manes M.D.   On: 09/06/2022 10:59   CT Chest Wo Contrast  Result Date: 08/28/2022 CLINICAL DATA:  Multiple lung nodules. EXAM: CT CHEST WITHOUT CONTRAST TECHNIQUE: Multidetector CT imaging of the chest was performed following the standard protocol without IV contrast. RADIATION DOSE REDUCTION: This exam was performed according to the departmental dose-optimization program which includes automated exposure control, adjustment of the mA and/or kV according to patient size and/or use of iterative reconstruction technique. COMPARISON:  05/14/2022. FINDINGS: Cardiovascular: The heart is normal in size and there is no pericardial effusion. Multi-vessel coronary artery calcifications are noted. There is atherosclerotic calcification of the aorta without evidence of  aneurysm. The pulmonary trunk is normal in caliber. Mediastinum/Nodes: No mediastinal or axillary lymphadenopathy. Evaluation of the hila is limited due to lack of IV contrast. The thyroid gland, trachea, and esophagus are within normal limits. There is a small hiatal hernia. Lungs/Pleura: Advanced emphysematous changes are present in the lungs. There is a spiculated nodule in the left upper lobe measuring 1.2 cm, axial image 42, unchanged. Postsurgical changes are present in the left lung with reduced lung volume. A 3 mm subpleural nodule is noted in the mid left lung, axial image 75, unchanged. There is a 2 mm subpleural nodule in the lateral left lung, axial image 79. An 8 mm nodule is noted in the right upper lobe, axial image 57, not significantly changed from the prior exam. No new nodule is identified. Multifocal scarring is noted bilaterally. No effusion or pneumothorax. Upper Abdomen: Stones are noted within the gallbladder. A nonobstructive renal calculus is noted on the right. No acute abnormality. Musculoskeletal: A tubular structure courses over the  cervical and thoracic region on the right and into the upper abdomen, possible ventriculoperitoneal shunt. Degenerative changes are present in the thoracic spine. No acute osseous abnormality. IMPRESSION: 1. Stable bilateral pulmonary nodules, the largest in the left upper lobe measuring 1.2 cm. No new nodule is identified. Consider one of the following in 3 months for both low-risk and high-risk individuals: (a) repeat chest CT, (b) follow-up PET-CT, or (c) tissue sampling. This recommendation follows the consensus statement: Guidelines for Management of Incidental Pulmonary Nodules Detected on CT Images: From the Fleischner Society 2017; Radiology 2017; 284:228-243. 2. Advanced emphysematous changes. 3. Coronary artery calcifications. 4. Aortic atherosclerosis. 5. Cholelithiasis. 6. Nonobstructive right renal calculus. Electronically Signed   By: Brett Fairy M.D.   On: 08/28/2022 02:50    Microbiology: Results for orders placed or performed during the hospital encounter of 09/06/22  SARS Coronavirus 2 by RT PCR (hospital order, performed in [facility] hospital lab) *cepheid single result test* Anterior Nasal Swab     Status: None   Collection Time: 09/06/22 10:28 AM   Specimen: Anterior Nasal Swab  Result Value Ref Range Status   SARS Coronavirus 2 by RT PCR NEGATIVE NEGATIVE Final    Comment: (NOTE) SARS-CoV-2 target nucleic acids are NOT DETECTED.  The SARS-CoV-2 RNA is generally detectable in upper and lower respiratory specimens during the acute phase of infection. The lowest concentration of SARS-CoV-2 viral copies this assay can detect is 250 copies / mL. A negative result does not preclude SARS-CoV-2 infection and should not be used as the sole basis for treatment or other patient management decisions.  A negative result may occur with improper specimen collection / handling, submission of specimen other than nasopharyngeal swab, presence of viral mutation(s) within the areas targeted by this assay, and inadequate number of viral copies (<250 copies / mL). A negative result must be combined with clinical observations, patient history, and epidemiological information.  Fact Sheet for Patients:   https://www.patel.info/  Fact Sheet for Healthcare Providers: https://hall.com/  This test is not yet approved or  cleared by the [place] FDA and has been authorized for detection and/or diagnosis of SARS-CoV-2 by FDA under an Emergency Use Authorization (EUA).  This EUA will remain in effect (meaning this test can be used) for the duration of the COVID-19 declaration under Section 564(b)(1) of the Act, 21 U.S.C. section 360bbb-3(b)(1), unless the authorization is terminated or revoked sooner.  Performed at [facility], [address], [place], [place] [address]    Urine Culture     Status: None   Collection Time: 09/10/22  2:35 PM   Specimen: Urine, Random  Result Value Ref Range Status   Specimen Description   Final    URINE, RANDOM Performed at [facility], [address] [place], [place] [address]    Special Requests   Final    NONE Performed at [facility], [address] [place], [place] [address]    Culture   Final    NO GROWTH Performed at [facility], [place] [address] [place], [place] [address]    Report Status 09/12/2022 FINAL  Final    Labs: CBC: Recent Labs  Lab 09/06/22 1020 09/07/22 0424 09/08/22 0453 09/11/22 0552  WBC 16.1* 10.6* 12.7* 12.9*  HGB 9.5* 8.5* 8.1* 8.4*  HCT 31.5* 28.7* 26.8* 27.2*  MCV 91.3 90.8 91.2 89.2  PLT 298 288 309 824   Basic Metabolic Panel: Recent Labs  Lab 09/06/22 1020 09/07/22  0424 09/11/22 0552  NA 141 141 139  K 2.8* 3.8 3.9  CL 103 107 100  CO2 32 30 31  GLUCOSE 169* 87 153*  BUN 23 23 37*  CREATININE 0.65 0.60 0.72  CALCIUM 9.0 8.7* 8.6*  MG 2.0  --   --    Liver Function Tests: Recent Labs  Lab 09/06/22 1020  AST 15  ALT 13  ALKPHOS 60  BILITOT 0.6  PROT 7.3  ALBUMIN 3.5   CBG: Recent Labs  Lab 09/11/22 1126 09/11/22 1151 09/11/22 1712 09/11/22 2125 09/12/22 0855  GLUCAP 227* 212* 191* 132* 148*    Discharge time spent: greater than 30 minutes.  Signed: [name], MD [facility] 09/12/2022

## 2022-09-12 NOTE — TOC Progression Note (Signed)
Transition of Care [facility]) - Progression Note    Patient Details  Name: Amanda Porter MRN: [id number] Date of Birth: 06/04/1946  Transition of Care [facility]) CM/SW Shelley, RN Phone Number: 09/12/2022, 12:34 PM  Clinical Narrative:    Called [name] and notified her that she will be going to room 1006B at [facility], she stated understanding EMS to transport the Patient  Called EMS to transport         Expected Discharge Plan and Services           Expected Discharge Date: 09/12/22                                     Social Determinants of Health (SDOH) Interventions    Readmission Risk Interventions     No data to display

## 2022-09-13 ENCOUNTER — Other Ambulatory Visit (HOSPITAL_COMMUNITY): Payer: Self-pay | Admitting: Psychiatry

## 2022-09-15 ENCOUNTER — Other Ambulatory Visit: Payer: Self-pay

## 2022-09-15 ENCOUNTER — Encounter
Admit: 2022-09-15 | Discharge: 2022-09-15 | Disposition: A | Discharge status: Home / Self Care | Payer: Medicare PPO | Attending: Neurosurgery | Admitting: Neurosurgery

## 2022-09-15 VITALS — Ht 62.0 in | Wt 105.0 lb

## 2022-09-15 DIAGNOSIS — M6281 Muscle weakness (generalized): Secondary | ICD-10-CM | POA: Diagnosis not present

## 2022-09-15 DIAGNOSIS — R296 Repeated falls: Secondary | ICD-10-CM | POA: Diagnosis not present

## 2022-09-15 DIAGNOSIS — E1165 Type 2 diabetes mellitus with hyperglycemia: Secondary | ICD-10-CM

## 2022-09-15 DIAGNOSIS — S72001A Fracture of unspecified part of neck of right femur, initial encounter for closed fracture: Secondary | ICD-10-CM | POA: Diagnosis not present

## 2022-09-15 DIAGNOSIS — E876 Hypokalemia: Secondary | ICD-10-CM | POA: Diagnosis not present

## 2022-09-15 DIAGNOSIS — F445 Conversion disorder with seizures or convulsions: Secondary | ICD-10-CM | POA: Diagnosis not present

## 2022-09-15 DIAGNOSIS — Z01818 Encounter for other preprocedural examination: Secondary | ICD-10-CM

## 2022-09-15 DIAGNOSIS — J440 Chronic obstructive pulmonary disease with acute lower respiratory infection: Secondary | ICD-10-CM | POA: Diagnosis not present

## 2022-09-15 DIAGNOSIS — I10 Essential (primary) hypertension: Secondary | ICD-10-CM | POA: Diagnosis not present

## 2022-09-15 HISTORY — DX: Unspecified fall, initial encounter: W19.XXXA

## 2022-09-15 HISTORY — DX: Repeated falls: R29.6

## 2022-09-15 NOTE — Patient Instructions (Signed)
Your procedure is scheduled on: 09/19/22 Report to [facility]. To find out your arrival time please call [phone] between 1PM - 3PM on 09/18/22.   Remember: Instructions that are not followed completely may result in serious medical risk, up to and including death, or upon the discretion of your surgeon and anesthesiologist your surgery may need to be rescheduled.      _X__ 1. Do not eat food after midnight the night before your procedure.                 No gum chewing or hard candies. You may drink clear liquids up to 2 hours                 before you are scheduled to arrive for your surgery- DO not drink clear                 liquids within 2 hours of arrival Diabetics water only   __X__2.  On the morning of surgery brush your teeth with toothpaste and water, you                 may rinse your mouth with mouthwash if you wish.  Do not swallow any              toothpaste of mouthwash.                           _X__ 3.  No Alcohol for 24 hours before or after surgery.    _X__ 4.  Do Not Smoke or use e-cigarettes For 24 Hours Prior to Your Surgery.                 Do not use any chewable tobacco products for at least 6 hours prior to                 surgery.   ____  5.  Bring all medications with you on the day of surgery if instructed.    __X__  6.  Notify your doctor if there is any change in your medical condition                 (cold, fever, infections).                          Do not wear jewelry, make-up, hairpins, clips or nail polish. Do not wear lotions, powders, or perfumes.  Do not shave body hair 48 hours prior to surgery. Men may shave face and neck. Do not bring valuables to the hospital.     [facility] is not responsible for any belongings or valuables.   Contacts, dentures/partials or body piercings may not be worn into surgery. Bring a case for your contacts, glasses or hearing aids, a denture cup will be  supplied. Leave your suitcase in the car. After surgery it may be brought to your room. For patients admitted to the hospital, discharge time is determined by your treatment team.              Patients discharged the day of surgery will not be allowed to drive home.                __X__ Take these medicines the morning of surgery with A SIP OF WATER:  1. buPROPion (WELLBUTRIN XL) 300 MG 24 hr tablet             2. busPIRone (BUSPAR) 45 MG tablet             3. memantine (NAMENDA) 5 MG tablet             4.             5.             6.   ____ Fleet Enema (as directed)            ____ Use CHG Soap/SAGE wipes as directed   __X__ Use inhalers on the day of surgery  Nebulizer treatment as well and bring her rescue inhaler   ____ Stop metformin/Janumet/Farxiga 2 days prior to surgery                       ____ Take 1/2 of usual insulin dose the night before surgery. No insulin the morning          of surgery.    ____ Stop Blood Thinners Coumadin/Plavix/Xarelto/Pleta/Pradaxa/Eliquis/Effient/Aspirin            on   Or contact your Surgeon, Cardiologist or Medical Doctor regarding         ability to stop your blood thinners   __X__ Stop Anti-inflammatories 7 days before surgery such as Advil, Ibuprofen, Motrin,   BC or Goodies Powder, Naprosyn, Naproxen, Aleve, Aspirin               __X__ Stop all herbals and supplements, fish oil or vitamins for 7 days  until after surgery.             ____ Bring C-Pap to the hospital.

## 2022-09-16 ENCOUNTER — Encounter: Payer: Medicare PPO | Admitting: Orthopedic Surgery

## 2022-09-16 DIAGNOSIS — E1165 Type 2 diabetes mellitus with hyperglycemia: Secondary | ICD-10-CM | POA: Diagnosis not present

## 2022-09-16 DIAGNOSIS — E876 Hypokalemia: Secondary | ICD-10-CM | POA: Diagnosis not present

## 2022-09-16 DIAGNOSIS — R339 Retention of urine, unspecified: Secondary | ICD-10-CM | POA: Diagnosis not present

## 2022-09-16 DIAGNOSIS — I10 Essential (primary) hypertension: Secondary | ICD-10-CM | POA: Diagnosis not present

## 2022-09-16 DIAGNOSIS — D509 Iron deficiency anemia, unspecified: Secondary | ICD-10-CM | POA: Diagnosis not present

## 2022-09-16 DIAGNOSIS — E785 Hyperlipidemia, unspecified: Secondary | ICD-10-CM | POA: Diagnosis not present

## 2022-09-16 DIAGNOSIS — M6281 Muscle weakness (generalized): Secondary | ICD-10-CM | POA: Diagnosis not present

## 2022-09-16 DIAGNOSIS — R918 Other nonspecific abnormal finding of lung field: Secondary | ICD-10-CM | POA: Diagnosis not present

## 2022-09-16 DIAGNOSIS — S72001D Fracture of unspecified part of neck of right femur, subsequent encounter for closed fracture with routine healing: Secondary | ICD-10-CM | POA: Diagnosis not present

## 2022-09-16 DIAGNOSIS — G4089 Other seizures: Secondary | ICD-10-CM | POA: Diagnosis not present

## 2022-09-16 DIAGNOSIS — J449 Chronic obstructive pulmonary disease, unspecified: Secondary | ICD-10-CM | POA: Diagnosis not present

## 2022-09-16 DIAGNOSIS — R296 Repeated falls: Secondary | ICD-10-CM | POA: Diagnosis not present

## 2022-09-16 DIAGNOSIS — E118 Type 2 diabetes mellitus with unspecified complications: Secondary | ICD-10-CM | POA: Diagnosis not present

## 2022-09-16 DIAGNOSIS — F0390 Unspecified dementia without behavioral disturbance: Secondary | ICD-10-CM | POA: Diagnosis not present

## 2022-09-16 DIAGNOSIS — S72001A Fracture of unspecified part of neck of right femur, initial encounter for closed fracture: Secondary | ICD-10-CM | POA: Diagnosis not present

## 2022-09-17 DIAGNOSIS — I10 Essential (primary) hypertension: Secondary | ICD-10-CM | POA: Diagnosis not present

## 2022-09-17 DIAGNOSIS — F445 Conversion disorder with seizures or convulsions: Secondary | ICD-10-CM | POA: Diagnosis not present

## 2022-09-17 DIAGNOSIS — R296 Repeated falls: Secondary | ICD-10-CM | POA: Diagnosis not present

## 2022-09-17 DIAGNOSIS — E1165 Type 2 diabetes mellitus with hyperglycemia: Secondary | ICD-10-CM | POA: Diagnosis not present

## 2022-09-17 DIAGNOSIS — J440 Chronic obstructive pulmonary disease with acute lower respiratory infection: Secondary | ICD-10-CM | POA: Diagnosis not present

## 2022-09-17 DIAGNOSIS — M6281 Muscle weakness (generalized): Secondary | ICD-10-CM | POA: Diagnosis not present

## 2022-09-17 DIAGNOSIS — S72001A Fracture of unspecified part of neck of right femur, initial encounter for closed fracture: Secondary | ICD-10-CM | POA: Diagnosis not present

## 2022-09-17 DIAGNOSIS — E876 Hypokalemia: Secondary | ICD-10-CM | POA: Diagnosis not present

## 2022-09-19 ENCOUNTER — Inpatient Hospital Stay
Admission: RE | Admit: 2022-09-19 | Discharge: 2022-09-23 | DRG: 031 | Disposition: A | Discharge status: Skilled Nursing Facility | Payer: Medicare PPO | Attending: Neurosurgery | Admitting: Neurosurgery

## 2022-09-19 ENCOUNTER — Inpatient Hospital Stay: Payer: Medicare PPO | Admitting: Urgent Care

## 2022-09-19 ENCOUNTER — Encounter: Payer: Self-pay | Admitting: Neurosurgery

## 2022-09-19 ENCOUNTER — Encounter
Admission: RE | Disposition: A | Discharge status: Skilled Nursing Facility | Payer: Self-pay | Source: Home / Self Care | Attending: Neurosurgery

## 2022-09-19 ENCOUNTER — Other Ambulatory Visit: Payer: Self-pay

## 2022-09-19 DIAGNOSIS — J9610 Chronic respiratory failure, unspecified whether with hypoxia or hypercapnia: Secondary | ICD-10-CM | POA: Diagnosis present

## 2022-09-19 DIAGNOSIS — Z87891 Personal history of nicotine dependence: Secondary | ICD-10-CM

## 2022-09-19 DIAGNOSIS — G912 (Idiopathic) normal pressure hydrocephalus: Secondary | ICD-10-CM | POA: Diagnosis present

## 2022-09-19 DIAGNOSIS — J449 Chronic obstructive pulmonary disease, unspecified: Secondary | ICD-10-CM | POA: Diagnosis present

## 2022-09-19 DIAGNOSIS — R296 Repeated falls: Secondary | ICD-10-CM | POA: Diagnosis present

## 2022-09-19 DIAGNOSIS — Z888 Allergy status to other drugs, medicaments and biological substances status: Secondary | ICD-10-CM

## 2022-09-19 DIAGNOSIS — E1165 Type 2 diabetes mellitus with hyperglycemia: Secondary | ICD-10-CM | POA: Diagnosis present

## 2022-09-19 DIAGNOSIS — T85618A Breakdown (mechanical) of other specified internal prosthetic devices, implants and grafts, initial encounter: Secondary | ICD-10-CM

## 2022-09-19 DIAGNOSIS — Z7984 Long term (current) use of oral hypoglycemic drugs: Secondary | ICD-10-CM

## 2022-09-19 DIAGNOSIS — Z66 Do not resuscitate: Secondary | ICD-10-CM | POA: Diagnosis not present

## 2022-09-19 DIAGNOSIS — R2689 Other abnormalities of gait and mobility: Secondary | ICD-10-CM | POA: Diagnosis not present

## 2022-09-19 DIAGNOSIS — R4182 Altered mental status, unspecified: Secondary | ICD-10-CM | POA: Diagnosis not present

## 2022-09-19 DIAGNOSIS — Z681 Body mass index (BMI) 19 or less, adult: Secondary | ICD-10-CM

## 2022-09-19 DIAGNOSIS — D649 Anemia, unspecified: Secondary | ICD-10-CM | POA: Diagnosis present

## 2022-09-19 DIAGNOSIS — T85618S Breakdown (mechanical) of other specified internal prosthetic devices, implants and grafts, sequela: Principal | ICD-10-CM

## 2022-09-19 DIAGNOSIS — Z79899 Other long term (current) drug therapy: Secondary | ICD-10-CM | POA: Diagnosis not present

## 2022-09-19 DIAGNOSIS — F0394 Unspecified dementia, unspecified severity, with anxiety: Secondary | ICD-10-CM | POA: Diagnosis present

## 2022-09-19 DIAGNOSIS — F0393 Unspecified dementia, unspecified severity, with mood disturbance: Secondary | ICD-10-CM | POA: Diagnosis present

## 2022-09-19 DIAGNOSIS — Z7401 Bed confinement status: Secondary | ICD-10-CM | POA: Diagnosis not present

## 2022-09-19 DIAGNOSIS — Z96641 Presence of right artificial hip joint: Secondary | ICD-10-CM | POA: Diagnosis present

## 2022-09-19 DIAGNOSIS — F419 Anxiety disorder, unspecified: Secondary | ICD-10-CM | POA: Diagnosis present

## 2022-09-19 DIAGNOSIS — E785 Hyperlipidemia, unspecified: Secondary | ICD-10-CM | POA: Diagnosis present

## 2022-09-19 DIAGNOSIS — Z79818 Long term (current) use of other agents affecting estrogen receptors and estrogen levels: Secondary | ICD-10-CM

## 2022-09-19 DIAGNOSIS — F0390 Unspecified dementia without behavioral disturbance: Secondary | ICD-10-CM | POA: Diagnosis present

## 2022-09-19 DIAGNOSIS — N390 Urinary tract infection, site not specified: Secondary | ICD-10-CM | POA: Diagnosis present

## 2022-09-19 DIAGNOSIS — Z833 Family history of diabetes mellitus: Secondary | ICD-10-CM

## 2022-09-19 DIAGNOSIS — Y838 Other surgical procedures as the cause of abnormal reaction of the patient, or of later complication, without mention of misadventure at the time of the procedure: Secondary | ICD-10-CM | POA: Diagnosis present

## 2022-09-19 DIAGNOSIS — T85618D Breakdown (mechanical) of other specified internal prosthetic devices, implants and grafts, subsequent encounter: Secondary | ICD-10-CM

## 2022-09-19 DIAGNOSIS — S7291XD Unspecified fracture of right femur, subsequent encounter for closed fracture with routine healing: Secondary | ICD-10-CM | POA: Diagnosis not present

## 2022-09-19 DIAGNOSIS — F17200 Nicotine dependence, unspecified, uncomplicated: Secondary | ICD-10-CM | POA: Diagnosis present

## 2022-09-19 DIAGNOSIS — Z9981 Dependence on supplemental oxygen: Secondary | ICD-10-CM

## 2022-09-19 DIAGNOSIS — Z794 Long term (current) use of insulin: Secondary | ICD-10-CM | POA: Diagnosis not present

## 2022-09-19 DIAGNOSIS — E43 Unspecified severe protein-calorie malnutrition: Secondary | ICD-10-CM | POA: Diagnosis present

## 2022-09-19 DIAGNOSIS — Z982 Presence of cerebrospinal fluid drainage device: Secondary | ICD-10-CM

## 2022-09-19 DIAGNOSIS — R41841 Cognitive communication deficit: Secondary | ICD-10-CM | POA: Diagnosis not present

## 2022-09-19 DIAGNOSIS — T8501XA Breakdown (mechanical) of ventricular intracranial (communicating) shunt, initial encounter: Secondary | ICD-10-CM | POA: Diagnosis not present

## 2022-09-19 DIAGNOSIS — I10 Essential (primary) hypertension: Secondary | ICD-10-CM | POA: Diagnosis present

## 2022-09-19 DIAGNOSIS — K219 Gastro-esophageal reflux disease without esophagitis: Secondary | ICD-10-CM | POA: Diagnosis present

## 2022-09-19 DIAGNOSIS — Z85118 Personal history of other malignant neoplasm of bronchus and lung: Secondary | ICD-10-CM | POA: Diagnosis not present

## 2022-09-19 DIAGNOSIS — E119 Type 2 diabetes mellitus without complications: Secondary | ICD-10-CM | POA: Diagnosis not present

## 2022-09-19 DIAGNOSIS — I69354 Hemiplegia and hemiparesis following cerebral infarction affecting left non-dominant side: Secondary | ICD-10-CM | POA: Diagnosis not present

## 2022-09-19 DIAGNOSIS — Z01818 Encounter for other preprocedural examination: Secondary | ICD-10-CM

## 2022-09-19 DIAGNOSIS — R278 Other lack of coordination: Secondary | ICD-10-CM | POA: Diagnosis not present

## 2022-09-19 DIAGNOSIS — D75839 Thrombocytosis, unspecified: Secondary | ICD-10-CM | POA: Diagnosis present

## 2022-09-19 DIAGNOSIS — Z809 Family history of malignant neoplasm, unspecified: Secondary | ICD-10-CM

## 2022-09-19 DIAGNOSIS — R2681 Unsteadiness on feet: Secondary | ICD-10-CM | POA: Diagnosis not present

## 2022-09-19 DIAGNOSIS — R413 Other amnesia: Secondary | ICD-10-CM | POA: Diagnosis present

## 2022-09-19 DIAGNOSIS — M6281 Muscle weakness (generalized): Secondary | ICD-10-CM | POA: Diagnosis not present

## 2022-09-19 HISTORY — PX: SHUNT REVISION VENTRICULAR-PERITONEAL: SHX6094

## 2022-09-19 LAB — BASIC METABOLIC PANEL
Anion gap: 7 (ref 5–15)
BUN: 17 mg/dL (ref 8–23)
CO2: 27 mmol/L (ref 22–32)
Calcium: 8.2 mg/dL — ABNORMAL LOW (ref 8.9–10.3)
Chloride: 100 mmol/L (ref 98–111)
Creatinine, Ser: 0.48 mg/dL (ref 0.44–1.00)
GFR, Estimated: >60 mL/min (ref >60)
Glucose, Bld: 192 mg/dL — ABNORMAL HIGH (ref 70–99)
Potassium: 3.8 mmol/L (ref 3.5–5.1)
Sodium: 134 mmol/L — ABNORMAL LOW (ref 135–145)

## 2022-09-19 LAB — GLUCOSE, CAPILLARY
Glucose-Capillary: 132 mg/dL — ABNORMAL HIGH (ref 70–99)
Glucose-Capillary: 138 mg/dL — ABNORMAL HIGH (ref 70–99)
Glucose-Capillary: 169 mg/dL — ABNORMAL HIGH (ref 70–99)
Glucose-Capillary: 240 mg/dL — ABNORMAL HIGH (ref 70–99)
Glucose-Capillary: 208 mg/dL — ABNORMAL HIGH (ref 70–99)

## 2022-09-19 LAB — CBC WITH DIFFERENTIAL/PLATELET
Abs Immature Granulocytes: 0.05 10*3/uL (ref 0.00–0.07)
Basophils Absolute: 0 10*3/uL (ref 0.0–0.1)
Basophils Relative: 0 %
Eosinophils Absolute: 0 10*3/uL (ref 0.0–0.5)
Eosinophils Relative: 0 %
HCT: 26 % — ABNORMAL LOW (ref 36.0–46.0)
Hemoglobin: 7.9 g/dL — ABNORMAL LOW (ref 12.0–15.0)
Immature Granulocytes: 0 %
Lymphocytes Relative: 5 %
Lymphs Abs: 0.6 10*3/uL — ABNORMAL LOW (ref 0.7–4.0)
MCH: 27.6 pg (ref 26.0–34.0)
MCHC: 30.4 g/dL (ref 30.0–36.0)
MCV: 90.9 fL (ref 80.0–100.0)
Monocytes Absolute: 0.1 10*3/uL (ref 0.1–1.0)
Monocytes Relative: 1 %
Neutro Abs: 13.5 10*3/uL — ABNORMAL HIGH (ref 1.7–7.7)
Neutrophils Relative %: 94 %
Platelets: 503 10*3/uL — ABNORMAL HIGH (ref 150–400)
RBC: 2.86 MIL/uL — ABNORMAL LOW (ref 3.87–5.11)
RDW: 15.2 % (ref 11.5–15.5)
WBC: 14.3 10*3/uL — ABNORMAL HIGH (ref 4.0–10.5)
nRBC: 0 % (ref 0.0–0.2)

## 2022-09-19 SURGERY — REVISION, SHUNT, VENTRICULOPERITONEAL
Anesthesia: General

## 2022-09-19 MED ORDER — INSULIN ASPART 100 UNIT/ML IJ SOLN
0.0000 [IU] | Freq: Every day | INTRAMUSCULAR | Status: DC
  Administered 2022-09-19: 2 [IU] via SUBCUTANEOUS
  Filled 2022-09-19: qty 1

## 2022-09-19 MED ORDER — PHENYLEPHRINE 80 MCG/ML (10ML) SYRINGE FOR IV PUSH (FOR BLOOD PRESSURE SUPPORT)
PREFILLED_SYRINGE | INTRAVENOUS | Status: AC
  Filled 2022-09-19: qty 10

## 2022-09-19 MED ORDER — PROMETHAZINE HCL 25 MG PO TABS: 12.5000 mg | ORAL_TABLET | ORAL | Status: DC | PRN

## 2022-09-19 MED ORDER — FENTANYL CITRATE (PF) 100 MCG/2ML IJ SOLN
INTRAMUSCULAR | Status: AC
  Filled 2022-09-19: qty 2

## 2022-09-19 MED ORDER — VANCOMYCIN HCL IN DEXTROSE 1-5 GM/200ML-% IV SOLN
1000.0000 mg | Freq: Once | INTRAVENOUS | Status: AC
  Administered 2022-09-19: 1000 mg via INTRAVENOUS

## 2022-09-19 MED ORDER — BUPROPION HCL ER (XL) 300 MG PO TB24
300.0000 mg | ORAL_TABLET | Freq: Every day | ORAL | Status: DC
  Administered 2022-09-20 – 2022-09-23 (×4): 300 mg via ORAL
  Filled 2022-09-19 (×4): qty 1

## 2022-09-19 MED ORDER — ACETAMINOPHEN 325 MG PO TABS
650.0000 mg | ORAL_TABLET | Freq: Four times a day (QID) | ORAL | Status: DC | PRN
  Administered 2022-09-20: 650 mg via ORAL
  Filled 2022-09-19: qty 2

## 2022-09-19 MED ORDER — HYDROCODONE-ACETAMINOPHEN 5-325 MG PO TABS
1.0000 | ORAL_TABLET | ORAL | Status: DC | PRN
  Administered 2022-09-19 – 2022-09-21 (×6): 1 via ORAL
  Filled 2022-09-19 (×6): qty 1

## 2022-09-19 MED ORDER — THROMBIN 5000 UNITS EX SOLR
CUTANEOUS | Status: AC
  Filled 2022-09-19: qty 10000

## 2022-09-19 MED ORDER — DEXTROSE 50 % IV SOLN: 25.0000 mL | INTRAVENOUS | Status: DC | PRN

## 2022-09-19 MED ORDER — ACETAMINOPHEN 650 MG RE SUPP: 650.0000 mg | Freq: Four times a day (QID) | RECTAL | Status: DC | PRN

## 2022-09-19 MED ORDER — BUSPIRONE HCL 15 MG PO TABS
45.0000 mg | ORAL_TABLET | Freq: Every morning | ORAL | Status: DC
  Administered 2022-09-20 – 2022-09-23 (×4): 45 mg via ORAL
  Filled 2022-09-19 (×4): qty 3

## 2022-09-19 MED ORDER — FENTANYL CITRATE (PF) 100 MCG/2ML IJ SOLN
INTRAMUSCULAR | Status: DC | PRN
  Administered 2022-09-19 (×2): 50 ug via INTRAVENOUS

## 2022-09-19 MED ORDER — UMECLIDINIUM BROMIDE 62.5 MCG/ACT IN AEPB
1.0000 | INHALATION_SPRAY | Freq: Every day | RESPIRATORY_TRACT | Status: DC
  Administered 2022-09-20 – 2022-09-23 (×4): 1 via RESPIRATORY_TRACT
  Filled 2022-09-19: qty 7

## 2022-09-19 MED ORDER — ACETAMINOPHEN 650 MG RE SUPP: 650.0000 mg | RECTAL | Status: DC | PRN

## 2022-09-19 MED ORDER — EPHEDRINE SULFATE (PRESSORS) 50 MG/ML IJ SOLN
INTRAMUSCULAR | Status: DC | PRN
  Administered 2022-09-19: 10 mg via INTRAVENOUS
  Administered 2022-09-19: 5 mg via INTRAVENOUS

## 2022-09-19 MED ORDER — ARFORMOTEROL TARTRATE 15 MCG/2ML IN NEBU
15.0000 ug | INHALATION_SOLUTION | Freq: Two times a day (BID) | RESPIRATORY_TRACT | Status: DC
  Filled 2022-09-19 (×9): qty 2

## 2022-09-19 MED ORDER — CEFAZOLIN SODIUM-DEXTROSE 2-4 GM/100ML-% IV SOLN
2.0000 g | Freq: Once | INTRAVENOUS | Status: AC
  Administered 2022-09-19: 2 g via INTRAVENOUS

## 2022-09-19 MED ORDER — INSULIN ASPART 100 UNIT/ML IJ SOLN
0.0000 [IU] | Freq: Three times a day (TID) | INTRAMUSCULAR | Status: DC
  Administered 2022-09-19: 3 [IU] via SUBCUTANEOUS
  Administered 2022-09-20: 2 [IU] via SUBCUTANEOUS
  Administered 2022-09-20 – 2022-09-21 (×3): 3 [IU] via SUBCUTANEOUS
  Administered 2022-09-21: 5 [IU] via SUBCUTANEOUS
  Administered 2022-09-22: 2 [IU] via SUBCUTANEOUS
  Administered 2022-09-22: 3 [IU] via SUBCUTANEOUS
  Administered 2022-09-22 – 2022-09-23 (×2): 2 [IU] via SUBCUTANEOUS
  Filled 2022-09-19 (×9): qty 1

## 2022-09-19 MED ORDER — ONDANSETRON HCL 4 MG PO TABS: 4.0000 mg | ORAL_TABLET | ORAL | Status: DC | PRN

## 2022-09-19 MED ORDER — SENNOSIDES-DOCUSATE SODIUM 8.6-50 MG PO TABS: 1.0000 | ORAL_TABLET | Freq: Every evening | ORAL | Status: DC | PRN

## 2022-09-19 MED ORDER — 0.9 % SODIUM CHLORIDE (POUR BTL) OPTIME
TOPICAL | Status: DC | PRN
  Administered 2022-09-19: 1000 mL

## 2022-09-19 MED ORDER — FENTANYL CITRATE (PF) 100 MCG/2ML IJ SOLN: 25.0000 ug | INTRAMUSCULAR | Status: DC | PRN

## 2022-09-19 MED ORDER — POTASSIUM CHLORIDE IN NACL 20-0.9 MEQ/L-% IV SOLN
INTRAVENOUS | Status: DC
  Filled 2022-09-19 (×6): qty 1000

## 2022-09-19 MED ORDER — HYDRALAZINE HCL 20 MG/ML IJ SOLN
5.0000 mg | Freq: Four times a day (QID) | INTRAMUSCULAR | Status: DC | PRN
  Filled 2022-09-19: qty 0.25

## 2022-09-19 MED ORDER — LIDOCAINE-EPINEPHRINE 1 %-1:100000 IJ SOLN
INTRAMUSCULAR | Status: AC
  Filled 2022-09-19: qty 1

## 2022-09-19 MED ORDER — ALBUTEROL SULFATE (2.5 MG/3ML) 0.083% IN NEBU
INHALATION_SOLUTION | RESPIRATORY_TRACT | Status: AC
  Filled 2022-09-19: qty 3

## 2022-09-19 MED ORDER — BUPIVACAINE LIPOSOME 1.3 % IJ SUSP
INTRAMUSCULAR | Status: AC
  Filled 2022-09-19: qty 20

## 2022-09-19 MED ORDER — PHENYLEPHRINE HCL-NACL 20-0.9 MG/250ML-% IV SOLN
INTRAVENOUS | Status: DC | PRN
  Administered 2022-09-19: 25 ug/min via INTRAVENOUS

## 2022-09-19 MED ORDER — ROSUVASTATIN CALCIUM 20 MG PO TABS
20.0000 mg | ORAL_TABLET | Freq: Every evening | ORAL | Status: DC
  Administered 2022-09-19 – 2022-09-22 (×4): 20 mg via ORAL
  Filled 2022-09-19 (×4): qty 1

## 2022-09-19 MED ORDER — ALBUTEROL SULFATE 2.5 MG/0.5ML IN NEBU
2.5000 mg | INHALATION_SOLUTION | Freq: Four times a day (QID) | RESPIRATORY_TRACT | Status: DC | PRN
  Administered 2022-09-19: 2.5 mg via RESPIRATORY_TRACT

## 2022-09-19 MED ORDER — LIDOCAINE-EPINEPHRINE 1 %-1:100000 IJ SOLN
INTRAMUSCULAR | Status: DC | PRN
  Administered 2022-09-19: 4 mL

## 2022-09-19 MED ORDER — TAMSULOSIN HCL 0.4 MG PO CAPS
0.4000 mg | ORAL_CAPSULE | Freq: Every day | ORAL | Status: DC
  Administered 2022-09-20 – 2022-09-23 (×4): 0.4 mg via ORAL
  Filled 2022-09-19 (×4): qty 1

## 2022-09-19 MED ORDER — ROCURONIUM BROMIDE 100 MG/10ML IV SOLN
INTRAVENOUS | Status: DC | PRN
  Administered 2022-09-19: 50 mg via INTRAVENOUS

## 2022-09-19 MED ORDER — SODIUM CHLORIDE 0.9 % IV SOLN: INTRAVENOUS | Status: DC

## 2022-09-19 MED ORDER — BUSPIRONE HCL 15 MG PO TABS
30.0000 mg | ORAL_TABLET | Freq: Every day | ORAL | Status: DC
  Administered 2022-09-19 – 2022-09-22 (×4): 30 mg via ORAL
  Filled 2022-09-19 (×4): qty 2

## 2022-09-19 MED ORDER — BUPIVACAINE LIPOSOME 1.3 % IJ SUSP
INTRAMUSCULAR | Status: DC | PRN
  Administered 2022-09-19: 20 mL

## 2022-09-19 MED ORDER — SUGAMMADEX SODIUM 200 MG/2ML IV SOLN
INTRAVENOUS | Status: DC | PRN
  Administered 2022-09-19: 220 mg via INTRAVENOUS

## 2022-09-19 MED ORDER — FOLIC ACID 1 MG PO TABS
1.0000 mg | ORAL_TABLET | Freq: Every day | ORAL | Status: DC
  Administered 2022-09-20 – 2022-09-23 (×4): 1 mg via ORAL
  Filled 2022-09-19 (×5): qty 1

## 2022-09-19 MED ORDER — ACETAMINOPHEN 325 MG PO TABS: 650.0000 mg | ORAL_TABLET | Freq: Four times a day (QID) | ORAL | Status: DC | PRN

## 2022-09-19 MED ORDER — ONDANSETRON HCL 4 MG/2ML IJ SOLN
INTRAMUSCULAR | Status: AC
  Filled 2022-09-19: qty 2

## 2022-09-19 MED ORDER — HYDROCODONE-ACETAMINOPHEN 5-325 MG PO TABS: 1.0000 | ORAL_TABLET | ORAL | Status: DC | PRN

## 2022-09-19 MED ORDER — ALBUTEROL SULFATE (2.5 MG/3ML) 0.083% IN NEBU
3.0000 mL | INHALATION_SOLUTION | RESPIRATORY_TRACT | Status: DC | PRN
  Filled 2022-09-19 (×2): qty 3

## 2022-09-19 MED ORDER — ESTROGENS CONJUGATED 0.625 MG/GM VA CREA
1.0000 | TOPICAL_CREAM | Freq: Every day | VAGINAL | Status: DC
  Administered 2022-09-20 – 2022-09-23 (×3): 1 via VAGINAL
  Filled 2022-09-19: qty 30

## 2022-09-19 MED ORDER — CLONAZEPAM 0.5 MG PO TABS: 0.5000 mg | ORAL_TABLET | Freq: Every day | ORAL | Status: DC | PRN

## 2022-09-19 MED ORDER — FAMOTIDINE 20 MG PO TABS: 20.0000 mg | ORAL_TABLET | Freq: Once | ORAL | Status: DC

## 2022-09-19 MED ORDER — GELATIN ABSORBABLE 12-7 MM EX MISC
CUTANEOUS | Status: AC
  Filled 2022-09-19: qty 1

## 2022-09-19 MED ORDER — ACETAMINOPHEN 10 MG/ML IV SOLN
INTRAVENOUS | Status: AC
  Filled 2022-09-19: qty 100

## 2022-09-19 MED ORDER — CHLORHEXIDINE GLUCONATE 0.12 % MT SOLN
15.0000 mL | Freq: Once | OROMUCOSAL | Status: AC
  Administered 2022-09-19: 15 mL via OROMUCOSAL

## 2022-09-19 MED ORDER — GLYCOPYRROLATE 0.2 MG/ML IJ SOLN
INTRAMUSCULAR | Status: DC | PRN
  Administered 2022-09-19: .2 mg via INTRAVENOUS

## 2022-09-19 MED ORDER — MEMANTINE HCL 5 MG PO TABS
5.0000 mg | ORAL_TABLET | Freq: Two times a day (BID) | ORAL | Status: DC
  Administered 2022-09-19 – 2022-09-23 (×8): 5 mg via ORAL
  Filled 2022-09-19 (×8): qty 1

## 2022-09-19 MED ORDER — DEXAMETHASONE SODIUM PHOSPHATE 10 MG/ML IJ SOLN
INTRAMUSCULAR | Status: DC | PRN
  Administered 2022-09-19: 10 mg via INTRAVENOUS

## 2022-09-19 MED ORDER — ACETAMINOPHEN 10 MG/ML IV SOLN
INTRAVENOUS | Status: DC | PRN
  Administered 2022-09-19: 1000 mg via INTRAVENOUS

## 2022-09-19 MED ORDER — AMLODIPINE BESYLATE 5 MG PO TABS
2.5000 mg | ORAL_TABLET | Freq: Every day | ORAL | Status: DC
  Administered 2022-09-20 – 2022-09-23 (×4): 2.5 mg via ORAL
  Filled 2022-09-19 (×4): qty 1

## 2022-09-19 MED ORDER — PHENYLEPHRINE 80 MCG/ML (10ML) SYRINGE FOR IV PUSH (FOR BLOOD PRESSURE SUPPORT)
PREFILLED_SYRINGE | INTRAVENOUS | Status: DC | PRN
  Administered 2022-09-19: 160 ug via INTRAVENOUS
  Administered 2022-09-19: 80 ug via INTRAVENOUS
  Administered 2022-09-19 (×2): 160 ug via INTRAVENOUS
  Administered 2022-09-19: 80 ug via INTRAVENOUS

## 2022-09-19 MED ORDER — ACETAMINOPHEN 325 MG PO TABS: 650.0000 mg | ORAL_TABLET | ORAL | Status: DC | PRN

## 2022-09-19 MED ORDER — ONDANSETRON HCL 4 MG/2ML IJ SOLN
INTRAMUSCULAR | Status: DC | PRN
  Administered 2022-09-19 (×2): 4 mg via INTRAVENOUS

## 2022-09-19 MED ORDER — LIDOCAINE HCL (CARDIAC) PF 100 MG/5ML IV SOSY
PREFILLED_SYRINGE | INTRAVENOUS | Status: DC | PRN
  Administered 2022-09-19: 80 mg via INTRAVENOUS

## 2022-09-19 MED ORDER — ONDANSETRON HCL 4 MG/2ML IJ SOLN
4.0000 mg | INTRAMUSCULAR | Status: DC | PRN
  Administered 2022-09-19: 4 mg via INTRAVENOUS
  Filled 2022-09-19: qty 2

## 2022-09-19 MED ORDER — GLIPIZIDE ER 2.5 MG PO TB24: 2.5000 mg | ORAL_TABLET | Freq: Every day | ORAL | Status: DC

## 2022-09-19 MED ORDER — PROPOFOL 10 MG/ML IV BOLUS
INTRAVENOUS | Status: DC | PRN
  Administered 2022-09-19: 150 mg via INTRAVENOUS

## 2022-09-19 MED ORDER — ENOXAPARIN SODIUM 40 MG/0.4ML IJ SOSY
40.0000 mg | PREFILLED_SYRINGE | INTRAMUSCULAR | Status: DC
  Administered 2022-09-20 – 2022-09-23 (×4): 40 mg via SUBCUTANEOUS
  Filled 2022-09-19 (×4): qty 0.4

## 2022-09-19 MED ORDER — ONDANSETRON HCL 4 MG/2ML IJ SOLN: 4.0000 mg | Freq: Once | INTRAMUSCULAR | Status: DC | PRN

## 2022-09-19 MED ORDER — ORAL CARE MOUTH RINSE: 15.0000 mL | Freq: Once | OROMUCOSAL | Status: AC

## 2022-09-19 MED FILL — Albuterol Sulfate Soln Nebu 0.083% (2.5 MG/3ML): RESPIRATORY_TRACT | Qty: 3 | Status: AC

## 2022-09-19 SURGICAL SUPPLY — 83 items
BLADE CLIPPER SURG (BLADE) ×1 IMPLANT
BOOT SUTURE AID YELLOW STND (SUTURE) IMPLANT
CATH VENT BACTISEAL SHUNT SYS (Shunt) IMPLANT
CHLORAPREP W/TINT 26 (MISCELLANEOUS) ×3 IMPLANT
COUNTER NEEDLE 20/40 LG (NEEDLE) ×1 IMPLANT
DEFOGGER ANTIFOG KIT (MISCELLANEOUS) ×1 IMPLANT
DEFOGGER SCOPE WARMER CLEARIFY (MISCELLANEOUS) ×1 IMPLANT
DERMABOND ADVANCED .7 DNX12 (GAUZE/BANDAGES/DRESSINGS) ×1 IMPLANT
DRAPE INCISE 23X17 IOBAN STRL (DRAPES) ×1
DRAPE INCISE 23X17 STRL (DRAPES) ×1 IMPLANT
DRAPE INCISE IOBAN 23X17 STRL (DRAPES) ×1 IMPLANT
DRAPE INCISE IOBAN 66X45 STRL (DRAPES) ×1 IMPLANT
DRAPE LAPAROTOMY 100X77 ABD (DRAPES) IMPLANT
DRAPE POUCH INSTRU U-SHP 10X18 (DRAPES) IMPLANT
DRAPE SURG 17X11 SM STRL (DRAPES) ×10 IMPLANT
DRAPE SURG IRRIG POUCH 19X23 (DRAPES) IMPLANT
DRAPE WARM FLUID 44X44 (DRAPES) IMPLANT
DRSG AQUACEL AG ADV 3.5X10 (GAUZE/BANDAGES/DRESSINGS) IMPLANT
DRSG TEGADERM 2-3/8X2-3/4 SM (GAUZE/BANDAGES/DRESSINGS) IMPLANT
DRSG TEGADERM 4X4.75 (GAUZE/BANDAGES/DRESSINGS) IMPLANT
DRSG TELFA 3X4 N-ADH STERILE (GAUZE/BANDAGES/DRESSINGS) ×2 IMPLANT
DRSG TELFA 3X8 NADH STRL (GAUZE/BANDAGES/DRESSINGS) ×1 IMPLANT
ELECT CAUTERY BLADE TIP 2.5 (TIP) ×1
ELECT COATED BLADE 2.86 ST (ELECTRODE) ×1 IMPLANT
ELECT REM PT RETURN 9FT ADLT (ELECTROSURGICAL) ×1
ELECTRODE CAUTERY BLDE TIP 2.5 (TIP) ×1 IMPLANT
ELECTRODE REM PT RTRN 9FT ADLT (ELECTROSURGICAL) ×1 IMPLANT
GAUZE 4X4 16PLY ~~LOC~~+RFID DBL (SPONGE) ×4 IMPLANT
GLOVE BIO SURGEON STRL SZ7 (GLOVE) IMPLANT
GLOVE BIOGEL PI IND STRL 6.5 (GLOVE) ×1 IMPLANT
GLOVE SURG SYN 6.5 ES PF (GLOVE) ×3 IMPLANT
GLOVE SURG SYN 6.5 PF PI (GLOVE) ×3 IMPLANT
GLOVE SURG SYN 8.5  E (GLOVE) ×3
GLOVE SURG SYN 8.5 E (GLOVE) ×3 IMPLANT
GLOVE SURG SYN 8.5 PF PI (GLOVE) ×3 IMPLANT
GOWN SRG LRG LVL 4 IMPRV REINF (GOWNS) ×1 IMPLANT
GOWN STRL REIN LRG LVL4 (GOWNS) ×1
GOWN STRL REUS W/ TWL LRG LVL3 (GOWN DISPOSABLE) ×1 IMPLANT
GOWN STRL REUS W/TWL LRG LVL3 (GOWN DISPOSABLE) ×1
GRADUATE 1200CC STRL 31836 (MISCELLANEOUS) IMPLANT
HEMOSTAT SURGICEL 2X14 (HEMOSTASIS) IMPLANT
HOLDER FOLEY CATH W/STRAP (MISCELLANEOUS) ×1 IMPLANT
KIT TURNOVER KIT A (KITS) ×1 IMPLANT
L-HOOK LAP DISP 36CM (ELECTROSURGICAL)
LHOOK LAP DISP 36CM (ELECTROSURGICAL) IMPLANT
MANIFOLD NEPTUNE II (INSTRUMENTS) ×1 IMPLANT
MARKER SKIN DUAL TIP RULER LAB (MISCELLANEOUS) ×2 IMPLANT
NDL INSUFFLATION 14GA 120MM (NEEDLE) IMPLANT
NEEDLE HYPO 22GX1.5 SAFETY (NEEDLE) IMPLANT
NEEDLE INSUFFLATION 14GA 120MM (NEEDLE) IMPLANT
NS IRRIG 1000ML POUR BTL (IV SOLUTION) ×2 IMPLANT
PACK CRANIOTOMY CUSTOM (CUSTOM PROCEDURE TRAY) ×1 IMPLANT
PACK LAMINECTOMY NEURO (CUSTOM PROCEDURE TRAY) IMPLANT
PACK UNIVERSAL (MISCELLANEOUS) IMPLANT
PAD ARMBOARD 7.5X6 YLW CONV (MISCELLANEOUS) ×2 IMPLANT
PAD PREP 24X41 OB/GYN DISP (PERSONAL CARE ITEMS) ×1 IMPLANT
PASSER CATH 65CM DISP (NEUROSURGERY SUPPLIES) IMPLANT
PASSER CATH SHUNT 55CM (INSTRUMENTS) IMPLANT
SET TUBE SMOKE EVAC HIGH FLOW (TUBING) ×1 IMPLANT
SHEATH PERITONEAL INTRO 61 (SHEATH) IMPLANT
SHEET NEURO XL SOL CTL (MISCELLANEOUS) IMPLANT
SLEEVE Z-THREAD 5X100MM (TROCAR) ×1 IMPLANT
SOLUTION IRRIG SURGIPHOR (IV SOLUTION) ×1 IMPLANT
STAPLER SKIN PROX 35W (STAPLE) ×2 IMPLANT
STYLET DISP PRECALIBRATE (MISCELLANEOUS) IMPLANT
SURGIFLO W/THROMBIN 8M KIT (HEMOSTASIS) IMPLANT
SUT MNCRL 4-0 (SUTURE) ×2
SUT MNCRL 4-0 27XMFL (SUTURE) ×2
SUT MNCRL AB 3-0 PS2 27 (SUTURE) IMPLANT
SUT PDS AB 0 CT1 27 (SUTURE) IMPLANT
SUT SILK 2 0SH CR/8 30 (SUTURE) ×2 IMPLANT
SUT VICRYL 2-0 SH 8X27 (SUTURE) ×2 IMPLANT
SUTURE MNCRL 4-0 27XMF (SUTURE) ×2 IMPLANT
SYR 10ML LL (SYRINGE) ×2 IMPLANT
TAPE CLOTH 3X10 WHT NS LF (GAUZE/BANDAGES/DRESSINGS) ×2 IMPLANT
TOWEL OR 17X26 4PK STRL BLUE (TOWEL DISPOSABLE) ×6 IMPLANT
TRAP FLUID SMOKE EVACUATOR (MISCELLANEOUS) ×1 IMPLANT
TRAY FOLEY MTR SLVR 16FR STAT (SET/KITS/TRAYS/PACK) IMPLANT
TROCAR Z-THREAD OPTICAL 5X100M (TROCAR) ×1 IMPLANT
TUBING CONNECTING 10 (TUBING) ×1 IMPLANT
VALVE PROGRAM HAKIM  SYS (Valve) ×1 IMPLANT
VALVE PROGRAM HAKIM SYS (Valve) IMPLANT
WATER STERILE IRR 500ML POUR (IV SOLUTION) ×1 IMPLANT

## 2022-09-19 NOTE — Anesthesia Procedure Notes (Signed)

## 2022-09-19 NOTE — Op Note (Addendum)
Indications: The patient is a 76yo female who presented with a shunt malfunction and normal pressure hydrocephalus.  Findings: nonfunctional shunt, placement of Codman Hakim programmable valve set at 120  Preoperative Diagnosis: shunt malfunction, normal pressure hydrocephalus Postoperative Diagnosis: same   EBL: 50 ml IVF: see AR ml Drains: none Disposition: Extubated and Stable to PACU Complications: none  A foley catheter was already in place.   Preoperative Note:   Risks of surgery discussed include: infection, bleeding, stroke, coma, death, paralysis, CSF leak, nerve/spinal cord injury, numbness, tingling, weakness, vascular injury, need for further surgery, persistent symptoms, and the risks of anesthesia. The patient understood these risks and agreed to proceed.  NAME OF PROCEDURE:               1. Removal and replacement of ventriculoperitoneal shunt  Co-surgeon - Dr. Caroleen Hamman (General Surgery)  PROCEDURE:  Patient was brought to the operating room, intubated. She was positioned on the horseshoe.  The hair was clipped.  Her prior incision was identified and marked.  The operative site was then prepped and draped in standard fashion.  A timeout was performed.  Dr. Adora Fridge assisted as a co-surgeon for peritoneal placement of the shunt.  He also acted as co-surgeon for removal replacement of the shunt device.  The cranial incision was injected with local anesthetic and then opened sharply.  The shunt catheter, valve, and distal catheter were exposed.  The proximal catheter was detached.  The valve and distal catheter were then removed.  The proximal catheter was not working appropriately.  Using the tunneler, the path to the abdomen was tunneled.  The shunt valve and distal catheter had been previously assembled, and were placed underneath the skin utilizing the tunneling device.  At this point, the proximal catheter was removed and a new proximal catheter was placed in a  slightly different trajectory through the same bur hole.  Brisk flow of spinal fluid was noted.  This was then attached to the valve stem and secured.  Flow via the distal portion of the shunt was noted.  Dr. Dahlia Byes then acted as co-surgeon for placement of the catheter in the peritoneum.  After the fully working shunt had been placed underneath the skin and into the peritoneum, I closed the cranial incisions with 2-0 Vicryl and staples.  Dr. Adora Fridge closed the abdominal incision.  Sterile dressings were applied.  Counts were correct.  The patient was then handed back to anesthesia after removal of the drapes.  Dr. Caroleen Hamman acted as a co-surgeon for his expertise in general surgery and placement of the peritoneal catheter into the peritoneum.  Meade Maw MD Neurosurgery

## 2022-09-19 NOTE — Op Note (Signed)
PROCEDURES: Removal of Ventriculoperitoneal shunt Replacement of Ventriculoperitoneal shunt  Pre-operative Diagnosis: normal pressure hydrocephalus with a shunt malfunction   Post-operative Diagnosis: Same  Surgeon: Elayne Guerin MD ( neurosurgery) Co-Surgeon: Jules Husbands MD ( General Surgery)  Anesthesia: General endotracheal anesthesia  ASA Class: 3  Findings: No evidence of intra-abdominal injuries Widely patent new catheter into the peritoneal cavity   Estimated Blood Loss: 5cc              Complications: none               Condition: stable  Procedure Details  The patient was seen again in the Holding Room. The benefits, complications, treatment options, and expected outcomes were discussed with the patient. The risks of bleeding, infection, recurrence of symptoms, failure to resolve symptoms,  bowel injury, any of which could require further surgery were reviewed with the patient.   The patient was taken to Operating Room, identified as Amanda Porter and the procedure verified.  A Time Out was held and the above information confirmed.  Prior to the induction of general anesthesia, antibiotic prophylaxis was administered. VTE prophylaxis was in place. General endotracheal anesthesia was then administered and tolerated well. After the induction, the abdomen was prepped with Chloraprep and draped in the sterile fashion. The patient was positioned. I assisted Dr. Cari Caraway with the Neurosurgical portion of the procedure please see separate note for that part.  I took over when the catheter was in the subxiphoid area via small wound.  We made sure the catheter was functional. I enlarge the abdominal incision and identified the fascia. This was elevated with kochers and peritoneum open with [employer]. No injuries observed. The catheter was placed into the peritoneal catheter, no kinks or obstructions were seen.  I used liposomal marcaine  to infiltrate the abdominal  wound.  Fascia was closed with a -0 PDS suture in a interrumpted fashion.  THe wound was closed with 2-0 vicryl for the sub q and 4-0 Monocryl for the skin in a subcuticular fashion. Dermabond was used to coat all the skin incisions. Needle and laparotomy count were correct and there were no immediate complications  Caroleen Hamman, MD, FACS

## 2022-09-19 NOTE — Consult Note (Signed)
Patient ID: Amanda Porter, adult   DOB: 09-22-1946, 76 y.o.   MRN: [id number]  HPI Amanda Porter is a 76 y.o. adult seen in consultation at the request of Dr. [name].  She does have acid history of normal pressure hydrocephalus and had a prior VP shunt that is nonfunctional at this time.  She is symptomatic from the hydrocephalus experiencing incontinence unsteady gait and altered mental status.  SHe has been having more recent falls.  She Did have a CT scan that have personally reviewed showing evidence of right VP shunt, chronic infarcts.  There is evidence of ventricular enlargement KUB shows evidence of VP shunt within the abdominal cavity. C BC shows chronic anemia of 8.4 normal platelets.  BMP is normal except some elevation of the BUN HPI  Past Medical History:  Diagnosis Date   Acute respiratory failure requiring reintubation ([place]) 11/05/2021   Anemia    Anxiety    Anxiety and depression    COPD (chronic obstructive pulmonary disease) (HCC)    CVA (cerebral vascular accident) ([place])    CVA (cerebral vascular accident) ([place])    L sided deficits   Dementia ([place])    Depression    Diabetes mellitus ([place])    Type 2   Diabetes mellitus without complication ([place])    Dysphagia    Dysphasia    Falls    GERD (gastroesophageal reflux disease)    History of lung cancer    Hyperlipidemia    Hypertension    Influenza A 12/13/2021   Memory disturbance 11/20/2017   Normal pressure hydrocephalus ([place]) 2018   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    Past Surgical History:  Procedure Laterality Date   HIP ARTHROPLASTY Right 09/07/2022   Procedure: ARTHROPLASTY BIPOLAR HIP (HEMIARTHROPLASTY);  Surgeon: Lovell Sheehan, MD;  Location: [facility] ORS;  Service: Orthopedics;   Laterality: Right;   IR FL GUIDED LOC OF NEEDLE/CATH TIP FOR SPINAL INJECTION RT  07/25/2022   LUNG REMOVAL, PARTIAL     left upper lobe   VENTRICULOPERITONEAL SHUNT  12/09/2016   VENTRICULOPERITONEAL SHUNT  10/2018    Family History  Problem Relation Age of Onset   Pneumonia Mother    Cancer Father    Diabetes Sister    Diabetes Sister    Breast cancer Neg Hx     Social History Social History   Tobacco Use   Smoking status: Former    Packs/day: 0.50    Years: 50.00    Total pack years: 25.00    Types: Cigarettes    Quit date: 08/01/2017    Years since quitting: 5.1   Smokeless tobacco: Never  Vaping Use   Vaping Use: Never used  Substance Use Topics   Alcohol use: Not Currently   Drug use: Not Currently    Allergies  Allergen Reactions   Lexapro [Escitalopram Oxalate] Other (See Comments)    Dizziness, nausea, fatigue   Neosporin  [Neomycin-Polymyxin-Gramicidin] Rash    (Neosporin)   Neosporin [Bacitracin-Polymyxin B] Rash    Current Facility-Administered Medications  Medication Dose Route Frequency Provider Last Rate Last Admin   0.9 %  sodium chloride infusion   Intravenous Continuous Martha Clan, MD       ceFAZolin (  ANCEF) IVPB 2g/100 mL premix  2 g Intravenous Once Meade Maw, MD       chlorhexidine (PERIDEX) 0.12 % solution 15 mL  15 mL Mouth/Throat Once Martha Clan, MD       Or   Oral care mouth rinse  15 mL Mouth Rinse Once Martha Clan, MD       famotidine (PEPCID) tablet 20 mg  20 mg Oral Once Karen Kitchens, NP       vancomycin (VANCOCIN) IVPB 1000 mg/200 mL premix  1,000 mg Intravenous Once Meade Maw, MD         Review of Systems Full ROS  was asked and was negative except for the information on the HPI  Physical Exam Blood pressure (!) 129/52, pulse 95, temperature 98.7 F (37.1 C), temperature source Temporal, resp. rate 16, height 5\' 2"  (1.575 m), weight 47.7 kg, SpO2 100 %. CONSTITUTIONAL: NAD debilitated and  chronically ill. EYES: Pupils are equal, round, t, Sclera are non-icteric. EARS, NOSE, MOUTH AND THROAT: The oropharynx is clear. The oral mucosa is pink and moist. Hearing is intact to voice. LYMPH NODES:  Lymph nodes in the neck are normal. RESPIRATORY:  Lungs are clear. There is normal respiratory effort, with equal breath sounds bilaterally, and without pathologic use of accessory muscles. CARDIOVASCULAR: Heart is regular without murmurs, gallops, or rubs. GI: The abdomen is  soft, nontender, and nondistended. There are no palpable masses. There is no hepatosplenomegaly. There are normal bowel sounds in all quadrants.  There is evidence of a small periumbilical scar GU: Rectal deferred.   MUSCULOSKELETAL: Normal muscle strength and tone. No cyanosis or edema.   SKIN: Turgor is good and there are no pathologic skin lesions or ulcers. NEUROLOGIC: Motor and sensation is grossly normal. Cranial nerves are grossly intact.  He does have VP shunt right parietal area tracking into the abdominal cavity.  No evidence of active infection. PSYCH:  Oriented to person, place and time. Affect is normal.  Data Reviewed  I have personally reviewed the patient's imaging, laboratory findings and medical records.    Assessment/Plan 76 year old female with symptomatic normal pressure hydrocephalus and mild functional ventriculoperitoneal shunt in need for revision.  We will perform the abdominal portion of the catheter.  Procedure discussed with the patient and the family in detail.  Risk, benefits and possible complications including but not limited to: Bleeding, infection, catheter malfunction, bowel injuries,.  She understands and wishes to proceed.  Extensive counseling provided.  Caroleen Hamman, MD FACS General Surgeon 09/19/2022, 9:24 AM

## 2022-09-19 NOTE — Interval H&P Note (Signed)
History and Physical Interval Note:  09/19/2022 9:24 AM  Amanda Porter  has presented today for surgery, with the diagnosis of G91.2 - NPH normal pressure hydrocephalus T85.618S - Shunt malfunction, sequela.  The various methods of treatment have been discussed with the patient and family. After consideration of risks, benefits and other options for treatment, the patient has consented to  Procedure(s): VENTRICULOPERITONEAL SHUNT REVISION (N/A) as a surgical intervention.  The patient's history has been reviewed, patient examined, no change in status, stable for surgery.  I have reviewed the patient's chart and labs.  Questions were answered to the patient's satisfaction.     [place]

## 2022-09-19 NOTE — Assessment & Plan Note (Signed)
-   Status post ventriculoperitoneal shunt revision on 09/19/2022 with Dr. Cari Caraway and Dr. Perrin Maltese - Per primary team

## 2022-09-19 NOTE — Progress Notes (Signed)
Patient awake and alert to name, place and verbalizes understanding surgery complete. X1 incision with dermabond mid abd, x2 sites right side of head, 2x2 dressing behind right ear, old drainage reinforced.  Telfa with staples parietal region. Patient calm without c/o's. Moving all extremities. Will continue to monitor closely.

## 2022-09-19 NOTE — Progress Notes (Signed)
Patient awake/alert x4. Tolerated po fluids without event. States she is tired of sitting in bed. Noted slightly short of breath:  albuteral neb x1.  Will continue to monitor. Sats 96% on 3 liters OTC

## 2022-09-19 NOTE — Transfer of Care (Addendum)
Immediate Anesthesia Transfer of Care Note  Patient: Amanda Porter  Procedure(s) Performed: VENTRICULOPERITONEAL SHUNT REVISION  Patient Location: PACU  Anesthesia Type:General  Level of Consciousness: drowsy and patient cooperative  Airway & Oxygen Therapy: Patient Spontanous Breathing and Patient connected to face mask oxygen  Post-op Assessment: Report given to RN and Post -op Vital signs reviewed and stable  Post vital signs: Reviewed and stable  Last Vitals:  Vitals Value Taken Time  BP 91/55 09/19/22 1219  Temp    Pulse 95 09/19/22 1229  Resp 21 09/19/22 1229  SpO2 100 % 09/19/22 1229  Vitals shown include unvalidated device data.  Last Pain:  Vitals:   09/19/22 0910  TempSrc: Temporal  PainSc: 0-No pain      Patients Stated Pain Goal: 0 (44/69/50 7225)  Complications: No notable events documented.

## 2022-09-19 NOTE — H&P (Signed)
Referring Physician:  No referring provider defined for this encounter.  Primary Physician:  Amanda Koch, NP  History of Present Illness: 09/19/2022 Amanda Porter is here today with a chief complaint of hydrocephalus with worsening falls.  08/12/2022 Amanda Porter is here today with a chief complaint of urinary incontinence, unsteady gait, and altered mental status. She was seen in the ER for this on 07/24/2022.  She is being treated for urinary tract infection currently.   07/25/2022 Amanda Porter is here today with a chief complaint of altered mental status, agitation, and urinary incontinence in the past few days.  She has had worsening unsteady gait with at least one fall.  She lives at baseline with her sister.   She denies HA, N, V.   Review of Systems:  A 10 point review of systems is negative, except for the pertinent positives and negatives detailed in the HPI.  Past Medical History: Past Medical History:  Diagnosis Date   Acute respiratory failure requiring reintubation ([place]) 11/05/2021   Anemia    Anxiety    Anxiety and depression    COPD (chronic obstructive pulmonary disease) (HCC)    CVA (cerebral vascular accident) ([place])    CVA (cerebral vascular accident) ([place])    L sided deficits   Dementia ([place])    Depression    Diabetes mellitus ([place])    Type 2   Diabetes mellitus without complication ([place])    Dysphagia    Dysphasia    Falls    GERD (gastroesophageal reflux disease)    History of lung cancer    Hyperlipidemia    Hypertension    Influenza A 12/13/2021   Memory disturbance 11/20/2017   Normal pressure hydrocephalus ([place]) 2018   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    Past Surgical History: Past Surgical  History:  Procedure Laterality Date   HIP ARTHROPLASTY Right 09/07/2022   Procedure: ARTHROPLASTY BIPOLAR HIP (HEMIARTHROPLASTY);  Surgeon: Lovell Sheehan, MD;  Location: [facility] ORS;  Service: Orthopedics;  Laterality: Right;   IR FL GUIDED LOC OF NEEDLE/CATH TIP FOR SPINAL INJECTION RT  07/25/2022   LUNG REMOVAL, PARTIAL     left upper lobe   VENTRICULOPERITONEAL SHUNT  12/09/2016   VENTRICULOPERITONEAL SHUNT  10/2018    Allergies: Allergies as of 09/17/2022 - Review Complete 09/15/2022  Allergen Reaction Noted   Lexapro [escitalopram oxalate] Other (See Comments) 09/23/2017   Neosporin  [neomycin-polymyxin-gramicidin] Rash    Neosporin [bacitracin-polymyxin b] Rash 11/04/2021    Medications: Current Meds  Medication Sig   Accu-Chek Softclix Lancets lancets Use as instructed   albuterol (VENTOLIN HFA) 108 (90 Base) MCG/ACT inhaler INHALE 2 PUFFS BY MOUTH EVERY 4 HOURS AS NEEDED   Albuterol Sulfate 2.5 MG/0.5ML NEBU Inhale 2.5 mg into the lungs every 6 (six) hours as needed.   amLODipine (NORVASC) 2.5 MG tablet Take 1 tablet (2.5 mg total) by mouth daily. for blood pressure. (Patient taking differently: Take 2.5 mg by mouth at bedtime. for blood pressure.)   buPROPion (WELLBUTRIN XL) 300 MG 24 hr tablet Take 1 tablet (300 mg total) by mouth daily with breakfast.   busPIRone (BUSPAR) 15 MG tablet Take 45 mg by mouth in the morning.  busPIRone (BUSPAR) 30 MG tablet Take 1 tablet (30 mg total) by mouth at bedtime.   clonazePAM (KLONOPIN) 0.5 MG tablet Take 1 tablet (0.5 mg total) by mouth at bedtime.   conjugated estrogens (PREMARIN) vaginal cream Apply one pea-sized amount around the opening of the urethra daily for 2 weeks, then 3 times weekly moving forward.   glipiZIDE (GLUCOTROL XL) 2.5 MG 24 hr tablet TAKE 1 TABLET (2.5 MG TOTAL) BY MOUTH DAILY WITH BREAKFAST. FOR DIABETES.   glucose blood test strip Use as instructed   HYDROcodone-acetaminophen (NORCO/VICODIN) 5-325 MG tablet Take  1 tablet by mouth every 4 (four) hours as needed for moderate pain (pain score 4-6).   insulin aspart (NOVOLOG) 100 UNIT/ML injection Inject 0-9 Units into the skin 3 (three) times daily with meals. Sliding scale insulin Less than 70 initiate hypoglycemia protocol 70-120  0 units 120-150 1 unit 151-200 2 units 201-250 3 units 251-300 5 units 301-350 7 units 351-400 9 units Greater than 400 call MD   memantine (NAMENDA) 5 MG tablet Take 1 tablet (5 mg total) by mouth 2 (two) times daily. For memory.   rosuvastatin (CRESTOR) 20 MG tablet TAKE 1 TABLET (20 MG TOTAL) BY MOUTH EVERY EVENING. FOR CHOLESTEROL.   tamsulosin (FLOMAX) 0.4 MG CAPS capsule Take 1 capsule (0.4 mg total) by mouth daily. For urine retention   Tiotropium Bromide-Olodaterol (STIOLTO RESPIMAT) 2.5-2.5 MCG/ACT AERS INHALE 2 PUFFS BY MOUTH INTO THE LUNGS DAILY (Patient taking differently: Inhale 2 puffs into the lungs daily.)    Social History: Social History   Tobacco Use   Smoking status: Former    Packs/day: 0.50    Years: 50.00    Total pack years: 25.00    Types: Cigarettes    Quit date: 08/01/2017    Years since quitting: 5.1   Smokeless tobacco: Never  Vaping Use   Vaping Use: Never used  Substance Use Topics   Alcohol use: Not Currently   Drug use: Not Currently    Family Medical History: Family History  Problem Relation Age of Onset   Pneumonia Mother    Cancer Father    Diabetes Sister    Diabetes Sister    Breast cancer Neg Hx     Physical Examination: Vitals:   09/19/22 0910  BP: (!) 129/52  Pulse: 95  Resp: 16  Temp: 98.7 F (37.1 C)  SpO2: 100%   Heart sounds normal no MRG. Chest Clear to Auscultation Bilaterally.  General: Patient is well developed, well nourished, calm, collected, and in no apparent distress. Attention to examination is appropriate.  Neck:   Supple.  Full range of motion.  Respiratory: Patient is breathing without any difficulty.   NEUROLOGICAL:     Awake, alert,  oriented to person, place, and 2023.  Speech is clear and fluent.    Cranial Nerves: Pupils equal round and reactive to light.  Facial tone is symmetric.  Facial sensation is symmetric. Shoulder shrug is symmetric. Tongue protrusion is midline.  There is no pronator drift.  ROM of spine: full.    Strength: MAEW Incision R hip looks excellent.   Medical Decision Making  Imaging: CT Head 09/09/22 IMPRESSION: No evidence of acute intracranial abnormality.   Stable position of a right frontoparietal approach ventricular catheter. Unchanged size and configuration of the ventricular system.   Redemonstrated chronic lacunar infarcts within bilateral thalami.   Advanced chronic small vessel ischemic changes within the cerebral white matter.     Electronically Signed  By: Kellie Simmering D.O.   On: 09/09/2022 09:25  I have personally reviewed the images and agree with the above interpretation.  Assessment and Plan: Mr. Sedlar is a pleasant 76 y.o. adult with normal pressure hydrocephalus with a shunt malfunction.  We will proceed with removal and replacement.       Pilot Prindle K. [name] MD, [facility] Neurosurgery

## 2022-09-19 NOTE — Assessment & Plan Note (Signed)
-   On chronic 3 L nasal cannula, this has been resumed

## 2022-09-19 NOTE — Assessment & Plan Note (Addendum)
-   Amlodipine 2.5 mg daily have been resumed - Hydralazine 5 mg IV every 6 hours as needed for SBP greater than 180, 5 days ordered

## 2022-09-19 NOTE — Assessment & Plan Note (Signed)
-   Noninsulin-dependent diabetes mellitus - Home glipizide has not been resumed due to high risk of hypoglycemia - Insulin SSI with at bedtime coverage ordered - CBG monitoring 3 times daily AC and at bedtime - Goal inpatient blood glucose levels 140-180

## 2022-09-19 NOTE — Anesthesia Preprocedure Evaluation (Signed)
Anesthesia Evaluation  Patient identified by MRN, date of birth, ID band Patient awake and Patient confused    Reviewed: Allergy & Precautions, NPO status , Patient's Chart, lab work & pertinent test results  History of Anesthesia Complications Negative for: history of anesthetic complications  Airway Mallampati: III  TM Distance: >3 FB Neck ROM: full    Dental  (+) Chipped, Poor Dentition, Missing, Dental Advidsory Given   Pulmonary neg shortness of breath, COPD (3 L),  oxygen dependent, neg recent URI, former smoker,    Pulmonary exam normal        Cardiovascular hypertension, (-) angina(-) Past MI Normal cardiovascular exam     Neuro/Psych neg Seizures PSYCHIATRIC DISORDERS Anxiety Depression Dementia VP shunt for normal pressure hydrocephalus  CVA    GI/Hepatic Neg liver ROS, GERD  Controlled,  Endo/Other  diabetes, Type 2  Renal/GU      Musculoskeletal   Abdominal   Peds  Hematology negative hematology ROS (+)   Anesthesia Other Findings Past Medical History: 11/05/2021: Acute respiratory failure requiring reintubation (HCC) No date: Anemia No date: Anxiety No date: Anxiety and depression No date: COPD (chronic obstructive pulmonary disease) (HCC) No date: CVA (cerebral vascular accident) ([place]) No date: CVA (cerebral vascular accident) ([place])     Comment:  L sided deficits No date: Dementia ([place]) No date: Depression No date: Diabetes mellitus ([place])     Comment:  Type 2 No date: Diabetes mellitus without complication (HCC) No date: Dysphagia No date: Dysphasia No date: GERD (gastroesophageal reflux disease) No date: History of lung cancer No date: Hyperlipidemia No date: Hypertension 12/13/2021: Influenza A 11/20/2017: Memory disturbance 2018: Normal pressure hydrocephalus ([place]) 08/25/2022: Nose colonized with MRSA     Comment:  a.) noted on pre-surgical swab prior to VP shunt                revision No date: Retinal detachment     Comment:  Right 11/20/2017: Right carotid bruit 02/01/2020: Tardive dyskinesia No date: Tardive dyskinesia No date: Thrombosis     Comment:  Arterial to lower extremity?  Past Surgical History: 07/25/2022: IR FL GUIDED LOC OF NEEDLE/CATH TIP FOR SPINAL INJECTION  RT No date: LUNG REMOVAL, PARTIAL     Comment:  left upper lobe 12/09/2016: VENTRICULOPERITONEAL SHUNT 10/2018: VENTRICULOPERITONEAL SHUNT  BMI    Body Mass Index: 18.95 kg/m      Reproductive/Obstetrics negative OB ROS                             Anesthesia Physical  Anesthesia Plan  ASA: 4  Anesthesia Plan: General   Post-op Pain Management:    Induction: Intravenous  PONV Risk Score and Plan: Ondansetron, Dexamethasone and Treatment may vary due to age or medical condition  Airway Management Planned: Oral ETT  Additional Equipment:   Intra-op Plan:   Post-operative Plan: Extubation in OR and Possible Post-op intubation/ventilation  Informed Consent: I have reviewed the patients History and Physical, chart, labs and discussed the procedure including the risks, benefits and alternatives for the proposed anesthesia with the patient or authorized representative who has indicated his/her understanding and acceptance.   Patient has DNR.  Discussed DNR with power of attorney and Suspend DNR.   Dental Advisory Given  Plan Discussed with: Anesthesiologist, CRNA and Surgeon  Anesthesia Plan Comments: (History and phone consent from the patients daughter Tashiya Souders at [phone]   Daughter consented for risks of anesthesia including but not  limited to:  - adverse reactions to medications - damage to eyes, teeth, lips or other oral mucosa - nerve damage due to positioning  - sore throat or hoarseness - Damage to heart, brain, nerves, lungs, other parts of body or loss of life  She voiced understanding.)        Anesthesia Quick  Evaluation

## 2022-09-19 NOTE — Assessment & Plan Note (Signed)
-   Rosuvastatin 20 mg nightly

## 2022-09-19 NOTE — Assessment & Plan Note (Signed)
-   Bupropion 300 mg daily with breakfast, buspirone 30 mg nightly, buspirone 45 mg q. morning her home medications that have been resumed

## 2022-09-19 NOTE — Assessment & Plan Note (Addendum)
-   Appears at baseline compared to most recent admission on 10/7 to 10/13 - Memantine 5 mg twice daily

## 2022-09-19 NOTE — Hospital Course (Signed)
[name] is a 76 year old female with medical diagnosis of normal pressure hydrocephalus, status post VP shunt placement and shunt malfunction, COPD, chronic respiratory failure on 3 L nasal cannula baseline, history of CVA, depression, dementia, non-insulin-dependent diabetes mellitus, dysphagia, dyslipidemia presented for scheduled surgical procedure with Dr. Cari Caraway and Dr. Perrin Maltese for ventriculoperitoneal shunt revision.  Hospitalist has been consulted for chronic medical management.  Initial vitals showed temperature of 98.7, respiration rate of 16, heart rate of 95, blood pressure 129/52, SPO2 of 100% on room air.

## 2022-09-19 NOTE — Assessment & Plan Note (Signed)
-   Patient states she quit several months ago

## 2022-09-19 NOTE — Consult Note (Addendum)
Initial Consultation Note   Patient: Amanda Porter ZJQ:[id number] DOB: 10/31/1946 PCP: Pleas Koch, NP DOA: 09/19/2022 DOS: the patient was seen and examined on 09/19/2022 Primary service: Meade Maw, MD  Referring physician: Dr. Cari Caraway Reason for consult: Chronic medical diagnoses  Assessment and Plan:  * NPH (normal pressure hydrocephalus) ([place]) - Status post ventriculoperitoneal shunt revision on 09/19/2022 with Dr. Cari Caraway and Dr. Perrin Maltese - Per primary team  Type 2 diabetes mellitus with hyperglycemia ([place]) - Noninsulin-dependent diabetes mellitus - Home glipizide has not been resumed due to high risk of hypoglycemia - Insulin SSI with at bedtime coverage ordered - CBG monitoring 3 times daily AC and at bedtime - Goal inpatient blood glucose levels 140-180  COPD mixed type (HCC) - On chronic 3 L nasal cannula, this has been resumed  Anxiety and depression - Bupropion 300 mg daily with breakfast, buspirone 30 mg nightly, buspirone 45 mg q. morning her home medications that have been resumed  Tobacco use disorder - Patient states she quit several months ago  Memory disturbance - Appears at baseline compared to most recent admission on 10/7 to 10/13 - Memantine 5 mg twice daily  Essential hypertension - Amlodipine 2.5 mg daily have been resumed - Hydralazine 5 mg IV every 6 hours as needed for SBP greater than 180, 5 days ordered  Hyperlipidemia - Rosuvastatin 20 mg nightly  TRH will continue to follow the patient.  [name] is a 76 year old female with medical diagnosis of normal pressure hydrocephalus, status post VP shunt placement and shunt malfunction, COPD, chronic respiratory failure on 3 L nasal cannula baseline, history of CVA, depression, dementia, non-insulin-dependent diabetes mellitus, dysphagia, dyslipidemia presented for scheduled surgical procedure with Dr. Cari Caraway and Dr. Perrin Maltese for ventriculoperitoneal shunt  revision.  Hospitalist has been consulted for chronic medical management.  Initial vitals showed temperature of 98.7, respiration rate of 16, heart rate of 95, blood pressure 129/52, SPO2 of 100% on room air.  Review of Systems: As mentioned in the history of present illness. All other systems reviewed and are negative. Past Medical History:  Diagnosis Date   Acute respiratory failure requiring reintubation ([place]) 11/05/2021   Anemia    Anxiety    Anxiety and depression    COPD (chronic obstructive pulmonary disease) (HCC)    CVA (cerebral vascular accident) ([place])    CVA (cerebral vascular accident) ([place])    L sided deficits   Dementia ([place])    Depression    Diabetes mellitus ([place])    Type 2   Diabetes mellitus without complication ([place])    Dysphagia    Dysphasia    Falls    GERD (gastroesophageal reflux disease)    History of lung cancer    Hyperlipidemia    Hypertension    Influenza A 12/13/2021   Memory disturbance 11/20/2017   Normal pressure hydrocephalus ([place]) 2018   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   Past Surgical History:  Procedure Laterality Date   HIP ARTHROPLASTY Right 09/07/2022   Procedure: ARTHROPLASTY BIPOLAR HIP (HEMIARTHROPLASTY);  Surgeon: Lovell Sheehan, MD;  Location: [facility] ORS;  Service: Orthopedics;  Laterality: Right;   IR FL GUIDED LOC OF NEEDLE/CATH TIP FOR SPINAL INJECTION RT  07/25/2022  LUNG REMOVAL, PARTIAL     left upper lobe   VENTRICULOPERITONEAL SHUNT  12/09/2016   VENTRICULOPERITONEAL SHUNT  10/2018   Social History:  reports that he quit smoking about 5 years ago. His smoking use included cigarettes. He has a 25.00 pack-year smoking history. He has never used smokeless tobacco. He reports that he does not  currently use alcohol. He reports that he does not currently use drugs.  Allergies  Allergen Reactions   Lexapro [Escitalopram Oxalate] Other (See Comments)    Dizziness, nausea, fatigue   Neosporin  [Neomycin-Polymyxin-Gramicidin] Rash    (Neosporin)   Neosporin [Bacitracin-Polymyxin B] Rash   Family History  Problem Relation Age of Onset   Pneumonia Mother    Cancer Father    Diabetes Sister    Diabetes Sister    Breast cancer Neg Hx    Prior to Admission medications   Medication Sig Start Date End Date Taking? Authorizing Provider  Accu-Chek Softclix Lancets lancets Use as instructed 04/04/22  Yes Dugal, Tabitha, FNP  albuterol (VENTOLIN HFA) 108 (90 Base) MCG/ACT inhaler INHALE 2 PUFFS BY MOUTH EVERY 4 HOURS AS NEEDED 07/11/22  Yes Young, Clinton D, MD  Albuterol Sulfate 2.5 MG/0.5ML NEBU Inhale 2.5 mg into the lungs every 6 (six) hours as needed.   Yes [provider]  amLODipine (NORVASC) 2.5 MG tablet Take 1 tablet (2.5 mg total) by mouth daily. for blood pressure. Patient taking differently: Take 2.5 mg by mouth at bedtime. for blood pressure. 08/19/22  Yes Pleas Koch, NP  buPROPion (WELLBUTRIN XL) 300 MG 24 hr tablet Take 1 tablet (300 mg total) by mouth daily with breakfast. 08/02/22  Yes Nolberto Hanlon, MD  busPIRone (BUSPAR) 15 MG tablet Take 45 mg by mouth in the morning.   Yes [provider]  busPIRone (BUSPAR) 30 MG tablet Take 1 tablet (30 mg total) by mouth at bedtime. 08/02/22  Yes Nolberto Hanlon, MD  clonazePAM (KLONOPIN) 0.5 MG tablet Take 1 tablet (0.5 mg total) by mouth at bedtime. 09/12/22  Yes [name], MD  conjugated estrogens (PREMARIN) vaginal cream Apply one pea-sized amount around the opening of the urethra daily for 2 weeks, then 3 times weekly moving forward. 08/20/22  Yes Vaillancourt, Samantha, PA-C  glipiZIDE (GLUCOTROL XL) 2.5 MG 24 hr tablet TAKE 1 TABLET (2.5 MG TOTAL) BY MOUTH DAILY WITH BREAKFAST. FOR DIABETES. 08/04/22  Yes Pleas Koch, NP  glucose blood test strip Use as instructed 04/04/22  Yes Dugal, Tabitha, FNP  HYDROcodone-acetaminophen (NORCO/VICODIN) 5-325 MG tablet Take 1 tablet by mouth every 4 (four) hours as needed for moderate pain (pain score 4-6). 09/09/22  Yes Carlynn Spry, PA-C  insulin aspart (NOVOLOG) 100 UNIT/ML injection Inject 0-9 Units into the skin 3 (three) times daily with meals. Sliding scale insulin Less than 70 initiate hypoglycemia protocol 70-120  0 units 120-150 1 unit 151-200 2 units 201-250 3 units 251-300 5 units 301-350 7 units 351-400 9 units Greater than 400 call MD 09/12/22  Yes Darrick Meigs, Marge Duncans, MD  memantine (NAMENDA) 5 MG tablet Take 1 tablet (5 mg total) by mouth 2 (two) times daily. For memory. 07/14/22  Yes Pleas Koch, NP  rosuvastatin (CRESTOR) 20 MG tablet TAKE 1 TABLET (20 MG TOTAL) BY MOUTH EVERY EVENING. FOR CHOLESTEROL. 07/13/22  Yes Pleas Koch, NP  tamsulosin (FLOMAX) 0.4 MG CAPS capsule Take 1 capsule (0.4 mg total) by mouth daily. For urine retention 12/13/21  Yes Pleas Koch, NP  Tiotropium Bromide-Olodaterol (STIOLTO RESPIMAT) 2.5-2.5 MCG/ACT AERS INHALE 2 PUFFS BY MOUTH INTO THE LUNGS DAILY Patient taking differently: Inhale 2 puffs into the lungs daily. 12/26/21  Yes Young, Tarri Fuller D, MD  albuterol (PROVENTIL) (2.5 MG/3ML) 0.083% nebulizer solution USE 1 VIAL IN NEBULIZER EVERY 6 HOURS Patient not taking: Reported on 09/17/2022 07/20/21   Deneise Lever, MD  aspirin 81 MG chewable tablet Chew 1 tablet (81 mg total) by mouth daily. 09/13/22   [name], MD  enoxaparin (LOVENOX) 40 MG/0.4ML injection Inject 0.4 mLs (40 mg total) into the skin daily for 13 days. 09/10/22 09/23/22  Carlynn Spry, PA-C  folic acid (FOLVITE) 1 MG tablet Take 1 tablet (1 mg total) by mouth daily. 12/13/21   Pleas Koch, NP   Physical Exam: Vitals:   09/19/22 1330 09/19/22 1400 09/19/22 1430 09/19/22 1500  BP: 122/60 (!) 117/51 137/65 131/68  Pulse:  88 92  97  Resp:  14 16 18   Temp:  98.3 F (36.8 C)    TempSrc:      SpO2:  100% 100% 100%  Weight:      Height:       Data Reviewed: CBG  BMP, CBC ordered, pending collection  Family Communication: No Primary team communication: Yes  Thank you very much for involving [place] in the care of your patient.  Author: Dr. Tobie Poet 09/19/2022 4:29 PM  For on call review [email or web address].

## 2022-09-20 DIAGNOSIS — G912 (Idiopathic) normal pressure hydrocephalus: Secondary | ICD-10-CM | POA: Diagnosis not present

## 2022-09-20 DIAGNOSIS — E1165 Type 2 diabetes mellitus with hyperglycemia: Secondary | ICD-10-CM | POA: Diagnosis not present

## 2022-09-20 DIAGNOSIS — F0390 Unspecified dementia without behavioral disturbance: Secondary | ICD-10-CM | POA: Diagnosis not present

## 2022-09-20 LAB — BASIC METABOLIC PANEL
Anion gap: 4 — ABNORMAL LOW (ref 5–15)
BUN: 13 mg/dL (ref 8–23)
CO2: 30 mmol/L (ref 22–32)
Calcium: 8.7 mg/dL — ABNORMAL LOW (ref 8.9–10.3)
Chloride: 106 mmol/L (ref 98–111)
Creatinine, Ser: 0.62 mg/dL (ref 0.44–1.00)
GFR, Estimated: >60 mL/min (ref >60)
Glucose, Bld: 154 mg/dL — ABNORMAL HIGH (ref 70–99)
Potassium: 4.4 mmol/L (ref 3.5–5.1)
Sodium: 140 mmol/L (ref 135–145)

## 2022-09-20 LAB — CBC
HCT: 24.9 % — ABNORMAL LOW (ref 36.0–46.0)
Hemoglobin: 7.5 g/dL — ABNORMAL LOW (ref 12.0–15.0)
MCH: 27.3 pg (ref 26.0–34.0)
MCHC: 30.1 g/dL (ref 30.0–36.0)
MCV: 90.5 fL (ref 80.0–100.0)
Platelets: 532 10*3/uL — ABNORMAL HIGH (ref 150–400)
RBC: 2.75 MIL/uL — ABNORMAL LOW (ref 3.87–5.11)
RDW: 15.2 % (ref 11.5–15.5)
WBC: 14.9 10*3/uL — ABNORMAL HIGH (ref 4.0–10.5)
nRBC: 0 % (ref 0.0–0.2)

## 2022-09-20 LAB — GLUCOSE, CAPILLARY
Glucose-Capillary: 144 mg/dL — ABNORMAL HIGH (ref 70–99)
Glucose-Capillary: 175 mg/dL — ABNORMAL HIGH (ref 70–99)
Glucose-Capillary: 196 mg/dL — ABNORMAL HIGH (ref 70–99)
Glucose-Capillary: 131 mg/dL — ABNORMAL HIGH (ref 70–99)

## 2022-09-20 MED ORDER — ADULT MULTIVITAMIN W/MINERALS CH
1.0000 | ORAL_TABLET | Freq: Every day | ORAL | Status: DC
  Administered 2022-09-20 – 2022-09-23 (×4): 1 via ORAL
  Filled 2022-09-20 (×5): qty 1

## 2022-09-20 MED ORDER — CHLORHEXIDINE GLUCONATE CLOTH 2 % EX PADS
6.0000 | MEDICATED_PAD | Freq: Every day | CUTANEOUS | Status: DC
  Administered 2022-09-20 – 2022-09-23 (×4): 6 via TOPICAL

## 2022-09-20 MED ORDER — ENSURE ENLIVE PO LIQD
237.0000 mL | Freq: Two times a day (BID) | ORAL | Status: DC
  Administered 2022-09-20 – 2022-09-23 (×6): 237 mL via ORAL

## 2022-09-20 NOTE — Progress Notes (Signed)
PROGRESS NOTE    Amanda Porter  AUQ:[id number] DOB: 1946/02/06 DOA: 09/19/2022 PCP: Pleas Koch, NP   Assessment & Plan:   Principal Problem:   NPH (normal pressure hydrocephalus) (HCC) Active Problems:   Dementia without behavioral disturbance (HCC)   Normal pressure hydrocephalus (HCC)   S/P VP shunt   Type 2 diabetes mellitus with hyperglycemia (HCC)   COPD mixed type (HCC)   Anxiety and depression   Hyperlipidemia   Essential hypertension   Memory disturbance   Tobacco use disorder   Protein-calorie malnutrition, severe   DNR (do not resuscitate)   Shunt malfunction  Assessment and Plan: NPH: s/p ventriculoperitoneal shunt revision on 09/19/2022 with Dr. Cari Caraway and Dr. Dahlia Byes. Management per neuro surg & gen surg    DM2: fair control, HbA1c 7.2 in 05/2022. Continue on SSI w/ accuchecks   COPD:  w/o exacerbation. Continue on chronic supplemental oxygen, 3L [place]   Depression: unknown severity. Continue on home dose of bupropion, buspirone    Tobacco use disorder: quit smoking several months ago    Dementia: continue on home dose of memantine    HTN: continue on home dose of amlodipine. IV hydralazine prn    HLD: continue on statin   Thrombocytosis: etiology unclear. Will continue to monitor   Leukocytosis: likely reactive   Normocytic anemia: H&H are labile. Will transfuse if Hb < 7.0    DVT prophylaxis: lovenox Code Status: full  Family Communication:  Disposition Plan: likely d/c to SNF  Level of care: Med-Surg  Status is: Inpatient Remains inpatient appropriate because: severity of illness    Consultants:  Hospitalist   Procedures:   Antimicrobials:    Subjective: Pt c/o fatigue   Objective: Vitals:   09/19/22 1500 09/20/22 0003 09/20/22 0836 09/20/22 1025  BP: 131/68 (!) 133/58  (!) 116/42  Pulse: 97 77  75  Resp: 18 18  18   Temp:  98.4 F (36.9 C)  98.3 F (36.8 C)  TempSrc:      SpO2: 100% 100% 100% 100%  Weight:       Height:        Intake/Output Summary (Last 24 hours) at 09/20/2022 1309 Last data filed at 09/20/2022 1028 Gross per 24 hour  Intake 1807.04 ml  Output 2400 ml  Net -592.96 ml   Filed Weights   09/19/22 0910  Weight: 47.7 kg    Examination:  General exam: Appears calm and comfortable. Frail appearing  Respiratory system: Clear to auscultation. Respiratory effort normal. Cardiovascular system: S1 & S2 +. No rubs, gallops or clicks.  Gastrointestinal system: Abdomen is nondistended, soft and nontender. Normal bowel sounds heard. Central nervous system: Alert and oriented to person, place only. Moves all extremities   Psychiatry: Judgement and insight appears at baseline. Mood & affect appropriate.     Data Reviewed: I have personally reviewed following labs and imaging studies  CBC: Recent Labs  Lab 09/19/22 1741 09/20/22 0807  WBC 14.3* 14.9*  NEUTROABS 13.5*  --   HGB 7.9* 7.5*  HCT 26.0* 24.9*  MCV 90.9 90.5  PLT 503* 638*   Basic Metabolic Panel: Recent Labs  Lab 09/19/22 1741 09/20/22 0807  NA 134* 140  K 3.8 4.4  CL 100 106  CO2 27 30  GLUCOSE 192* 154*  BUN 17 13  CREATININE 0.48 0.62  CALCIUM 8.2* 8.7*   GFR: Estimated Creatinine Clearance (by C-G formula based on SCr of 0.62 mg/dL) Female: 44.3 mL/min Female: 52.2 mL/min Liver Function Tests:  No results for input(s): "AST", "ALT", "ALKPHOS", "BILITOT", "PROT", "ALBUMIN" in the last 168 hours. No results for input(s): "LIPASE", "AMYLASE" in the last 168 hours. No results for input(s): "AMMONIA" in the last 168 hours. Coagulation Profile: No results for input(s): "INR", "PROTIME" in the last 168 hours. Cardiac Enzymes: No results for input(s): "CKTOTAL", "CKMB", "CKMBINDEX", "TROPONINI" in the last 168 hours. BNP (last 3 results) Recent Labs    04/04/22 1025  PROBNP 13.0   HbA1C: No results for input(s): "HGBA1C" in the last 72 hours. CBG: Recent Labs  Lab 09/19/22 1607 09/19/22 2111  09/19/22 2312 09/20/22 0743 09/20/22 1135  GLUCAP 169* 240* 208* 144* 175*   Lipid Profile: No results for input(s): "CHOL", "HDL", "LDLCALC", "TRIG", "CHOLHDL", "LDLDIRECT" in the last 72 hours. Thyroid Function Tests: No results for input(s): "TSH", "T4TOTAL", "FREET4", "T3FREE", "THYROIDAB" in the last 72 hours. Anemia Panel: No results for input(s): "VITAMINB12", "FOLATE", "FERRITIN", "TIBC", "IRON", "RETICCTPCT" in the last 72 hours. Sepsis Labs: No results for input(s): "PROCALCITON", "LATICACIDVEN" in the last 168 hours.  Recent Results (from the past 240 hour(s))  Urine Culture     Status: None   Collection Time: 09/10/22  2:35 PM   Specimen: Urine, Random  Result Value Ref Range Status   Specimen Description   Final    URINE, RANDOM Performed at [facility], [address], [place], [place] [address]    Special Requests   Final    NONE Performed at [facility], [address], [place], [place] [address]    Culture   Final    NO GROWTH Performed at [facility], [place] [address] [place], [place] [address]    Report Status 09/12/2022 FINAL  Final         Radiology Studies: No results found.      Scheduled Meds:  amLODipine  2.5 mg Oral Daily   arformoterol  15 mcg Nebulization BID   And   umeclidinium bromide  1 puff Inhalation Daily   buPROPion  300 mg Oral Q breakfast   busPIRone  30 mg Oral QHS   busPIRone  45 mg Oral q AM   Chlorhexidine Gluconate Cloth  6 each Topical Daily   conjugated estrogens  1 Applicatorful Vaginal Daily   enoxaparin (LOVENOX) injection  40 mg Subcutaneous Q24H   feeding supplement  237 mL Oral BID BM   folic acid  1 mg Oral Daily   insulin aspart  0-15 Units Subcutaneous TID WC   insulin aspart  0-5 Units Subcutaneous QHS   memantine  5 mg Oral BID   multivitamin with minerals  1 tablet Oral Daily   rosuvastatin  20 mg Oral QPM   tamsulosin  0.4 mg Oral Daily   Continuous Infusions:  0.9  % NaCl with KCl 20 mEq / L 75 mL/hr at 09/20/22 0446     LOS: 1 day    Time spent: 35 mins     Wyvonnia Dusky, MD [facility] Pager [phone]  If 7PM-7AM, please contact night-coverage [email or web address] 09/20/2022, 1:09 PM

## 2022-09-20 NOTE — Evaluation (Signed)
Occupational Therapy Evaluation Patient Details Name: Amanda Porter MRN: [id number] DOB: 1945/12/06 Today's Date: 09/20/2022   History of Present Illness Pt is a 76 year old female admitted with a shunt malfunction and normal pressure hydrocephalusnow Status post ventriculoperitoneal shunt revision on 09/19/2022 ; PMH significant for  normal pressure hydrocephalus, s/p VP shunt placement and shunt malfunction, COPD, chronic respiratory failure, history of CVA, depression, dementia, diabetes mellitus type 2, dysphagia, dyslipidemia came to ED with frequent falls   Clinical Impression   Chart reviewed, pt greeted in bed agreeable to OT evaluation. Pt is oriented to self and place, poor safety awareness/insight into deficits. PTA pt comes from rehab, per chart was living with family prior to previous admission. Pt presents with deficits in strength, endurance, activity tolerance, balance affecting safe and optimal ADL completion. Pt performs bed mobility with MOD A, STS with MIN A with RW, steps forward/backward with MIN A with RW, up the bed to the R with MIN A with RW. Further mobility is limited by dizziness. Grooming tasks completed with set up. Recommend discharge back to STR to address deficits.      Recommendations for follow up therapy are one component of a multi-disciplinary discharge planning process, led by the attending physician.  Recommendations may be updated based on patient status, additional functional criteria and insurance authorization.   Follow Up Recommendations  Skilled nursing-short term rehab (<3 hours/day)    Assistance Recommended at Discharge Frequent or constant Supervision/Assistance  Patient can return home with the following A lot of help with bathing/dressing/bathroom;Help with stairs or ramp for entrance;A little help with walking and/or transfers    Functional Status Assessment  Patient has had a recent decline in their functional status and demonstrates the  ability to make significant improvements in function in a reasonable and predictable amount of time.  Equipment Recommendations  Other (comment) (per next venue of care)    Recommendations for Other Services       Precautions / Restrictions Precautions Precautions: Posterior Hip;Fall Precaution Booklet Issued: No Restrictions Weight Bearing Restrictions: Yes RLE Weight Bearing: Weight bearing as tolerated      Mobility Bed Mobility Overal bed mobility: Needs Assistance Bed Mobility: Sit to Supine, Supine to Sit     Supine to sit: Mod assist Sit to supine: Mod assist        Transfers Overall transfer level: Needs assistance Equipment used: Rolling walker (2 wheels) Transfers: Sit to/from Stand Sit to Stand: Min assist                  Balance Overall balance assessment: Needs assistance Sitting-balance support: Feet supported, Bilateral upper extremity supported Sitting balance-Leahy Scale: Good     Standing balance support: Bilateral upper extremity supported, During functional activity Standing balance-Leahy Scale: Fair                             ADL either performed or assessed with clinical judgement   ADL Overall ADL's : Needs assistance/impaired     Grooming: Wash/dry face;Sitting;Set up               Lower Body Dressing: Minimal assistance Lower Body Dressing Details (indicate cue type and reason): vcs for precautions Toilet Transfer: Minimal assistance;Cueing for sequencing Toilet Transfer Details (indicate cue type and reason): simulated Toileting- Clothing Manipulation and Hygiene: Maximal assistance;Sit to/from stand;Cueing for sequencing Toileting - Clothing Manipulation Details (indicate cue type and reason): anticipated  Vision Patient Visual Report: No change from baseline       Perception     Praxis      Pertinent Vitals/Pain Pain Assessment Pain Assessment: No/denies pain     Hand  Dominance Right   Extremity/Trunk Assessment Upper Extremity Assessment Upper Extremity Assessment: Generalized weakness   Lower Extremity Assessment Lower Extremity Assessment: Generalized weakness       Communication Communication Communication: No difficulties   Cognition Arousal/Alertness: Awake/alert Behavior During Therapy: WFL for tasks assessed/performed Overall Cognitive Status: No family/caregiver present to determine baseline cognitive functioning Area of Impairment: Orientation, Following commands, Safety/judgement, Memory, Problem solving                 Orientation Level: Disoriented to, Time, Situation   Memory: Decreased short-term memory Following Commands: Follows one step commands with increased time Safety/Judgement: Decreased awareness of deficits   Problem Solving: Slow processing, Requires verbal cues, Difficulty sequencing       General Comments  c/o dizziness throughout    Exercises     Shoulder Instructions      Home Living Family/patient expects to be discharged to:: Skilled nursing facility Living Arrangements: Other relatives;Children Available Help at Discharge: Family;Available PRN/intermittently Type of Home: House Home Access: Level entry     Home Layout: One level     Bathroom Shower/Tub: Teacher, early years/pre: Standard     Home Equipment: Rollator (4 wheels);Shower seat;Grab bars - tub/shower   Additional Comments: PLOF/house set up from chart, pt is poor historian      Prior Functioning/Environment Prior Level of Function : Patient poor historian/Family not available             Mobility Comments: Pt states she comes from home, chart states she comes from rehab ADLs Comments: Pt reports assit required for ADL/IADL        OT Problem List: Decreased range of motion;Decreased strength;Decreased activity tolerance;Impaired balance (sitting and/or standing);Decreased safety awareness      OT  Treatment/Interventions: Self-care/ADL training;Therapeutic exercise;Energy conservation;DME and/or AE instruction;Therapeutic activities;Patient/family education;Balance training    OT Goals(Current goals can be found in the care plan section) Acute Rehab OT Goals Patient Stated Goal: get stronger OT Goal Formulation: With patient Time For Goal Achievement: 10/04/22 Potential to Achieve Goals: Good ADL Goals Pt Will Perform Grooming: with supervision;standing Pt Will Perform Lower Body Dressing: with supervision;sit to/from stand Pt Will Transfer to Toilet: with supervision;ambulating Pt Will Perform Toileting - Clothing Manipulation and hygiene: with supervision;sit to/from stand  OT Frequency: Min 2X/week    Co-evaluation              AM-PAC OT "6 Clicks" Daily Activity     Outcome Measure Help from another person eating meals?: None Help from another person taking care of personal grooming?: None Help from another person toileting, which includes using toliet, bedpan, or urinal?: A Lot Help from another person bathing (including washing, rinsing, drying)?: A Lot Help from another person to put on and taking off regular upper body clothing?: A Little Help from another person to put on and taking off regular lower body clothing?: A Little 6 Click Score: 18   End of Session Equipment Utilized During Treatment: Rolling walker (2 wheels)  Activity Tolerance: Patient tolerated treatment well Patient left: in bed;with call bell/phone within reach;with bed alarm set  OT Visit Diagnosis: Repeated falls (R29.6);Other abnormalities of gait and mobility (R26.89)  Time: 3736-6815 OT Time Calculation (min): 11 min Charges:  OT General Charges $OT Visit: 1 Visit OT Evaluation $OT Eval Low Complexity: 1 Low  Shanon Payor, OTD OTR/L  09/20/22, 1:13 PM

## 2022-09-20 NOTE — TOC Progression Note (Addendum)
Transition of Care [facility]) - Progression Note    Patient Details  Name: Amanda Porter MRN: [id number] Date of Birth: 1946-06-17  Transition [facility]) CM/SW [place], [place] Phone Number: 09/20/2022, 11:37 AM  Clinical Narrative:     Pt from [facility] [place] spoke with daughter/POA, she states she doesn't feel SNF was responsive to the needs of the pt, pt was refusing PT at SNF. Daughter also states pt is quickly approaching co pay status and AP won't allow for a payment plan, rather wants seven days upfront each week. Daughter states she is trying to find a SNF that will allow for payment plans. CSW asked daughter if pt would qualify for Medicaid, she states pt does not, pt lives with her however she has her own home. She states she has been trying to find resources to help with in home care but has not been successful, she states she works 12 hour shifts herself and they have very little supports. Daughter states she doesn't think pt is medically clear, MD made aware of daughters concerns, request to have MD speak with daughter. Unable to confirm financial information as business office is closed. TOC will continue to follow.        Expected Discharge Plan and Services           Expected Discharge Date: 09/21/22                                     Social Determinants of Health (SDOH) Interventions    Readmission Risk Interventions     No data to display

## 2022-09-20 NOTE — Progress Notes (Signed)
    Attending Progress Note  History: Amanda Porter is here for hydrocephalus and shunt malfunction.  POD1: Doing well.  No complaints.  Physical Exam: Vitals:   09/20/22 0836 09/20/22 1025  BP:  (!) 116/42  Pulse:  75  Resp:  18  Temp:  98.3 F (36.8 C)  SpO2: 100% 100%    AA [facility], "nursing home," 2023 with choice CNI No drift  Strength:5/5 throughout BUE  Dressings c/d/I  Data:  Other tests/results: n/a  Assessment/Plan:  Amanda Porter is stable after shunt removal and replacement for normal pressure hydrocephalus.  - mobilize - pain control - DVT prophylaxis - PTOT   Meade Maw MD, [facility] Department of Neurosurgery

## 2022-09-20 NOTE — Progress Notes (Signed)
Initial Nutrition Assessment  INTERVENTION:   -Ensure Plus High Protein po BID, each supplement provides 350 kcal and 20 grams of protein.   -Multivitamin with minerals daily  NUTRITION DIAGNOSIS:   Increased nutrient needs related to post-op healing as evidenced by estimated needs.  GOAL:   Patient will meet greater than or equal to 90% of their needs  MONITOR:   PO intake, Supplement acceptance, Labs, Weight trends, I & O's  REASON FOR ASSESSMENT:   Consult Assessment of nutrition requirement/status  ASSESSMENT:   76 year old female with medical diagnosis of normal pressure hydrocephalus, status post VP shunt placement and shunt malfunction, COPD, chronic respiratory failure on 3 L nasal cannula baseline, history of CVA, depression, dementia, non-insulin-dependent diabetes mellitus, dysphagia, dyslipidemia presented for scheduled surgical procedure with Dr. Cari Caraway and Dr. Perrin Maltese for ventriculoperitoneal shunt revision.  10/20: s/p removal of old VP shunt and replacement  Patient just recently discharged from having hip fracture surgery 10/8. No PO documented since procedure yesterday. Will order Ensure supplements given post-op healing needs.  Per weight records, pt with no new weights recorded since 9/18. Likely needs to be weighed.  Medications: Folic acid  Labs reviewed: CBGs: 132-240   NUTRITION - FOCUSED PHYSICAL EXAM:  Unable to complete, working remotely.  Diet Order:   Diet Order             Diet heart healthy/carb modified Room service appropriate? Yes; Fluid consistency: Thin  Diet effective now                   EDUCATION NEEDS:   Not appropriate for education at this time  Skin:  Skin Assessment: Skin Integrity Issues: Skin Integrity Issues:: Incisions Incisions: 10/20: head, 10/20: abdomen, 10/8: right thigh  Last BM:  PTA  Height:   Ht Readings from Last 1 Encounters:  09/19/22 5\' 2"  (1.575 m)    Weight:   Wt Readings  from Last 1 Encounters:  09/19/22 47.7 kg    BMI:  Body mass index is 19.22 kg/m.  Estimated Nutritional Needs:   Kcal:  1400-1600  Protein:  70-85g  Fluid:  1.6L/day   Clayton Bibles, MS, RD, LDN Inpatient Clinical Dietitian Contact information available via Amion

## 2022-09-20 NOTE — Plan of Care (Signed)
  Problem: Skin Integrity: Goal: Risk for impaired skin integrity will decrease Outcome: Progressing   Problem: Tissue Perfusion: Goal: Adequacy of tissue perfusion will improve Outcome: Progressing   Problem: Clinical Measurements: Goal: Cardiovascular complication will be avoided Outcome: Progressing   Problem: Nutrition: Goal: Adequate nutrition will be maintained Outcome: Progressing   Problem: Coping: Goal: Level of anxiety will decrease Outcome: Progressing   Problem: Pain Managment: Goal: General experience of comfort will improve Outcome: Progressing

## 2022-09-20 NOTE — Evaluation (Signed)
Physical Therapy Evaluation Patient Details Name: Amanda Porter MRN: [id number] DOB: 19-Oct-1946 Today's Date: 09/20/2022  History of Present Illness  Pt admitted to [facility] on 09/19/22 for c/o normal pressure hydrocephalus with worsening falls, incontinence, unsteady gait, and AMS secondary to VP shunt malfunction. Pt with multiple recent hospital admissions for falls and AMS. S/p elective removal and replacement of VP shunt 10/20 by Dr. [name]. Significant PMH includes: COPD, chronic respiratory failure on 3 L nasal cannula baseline, history of CVA, depression, dementia, non-insulin-dependent diabetes mellitus, dysphagia, dyslipidemia.   Clinical Impression  Pt is a 76 year old f admitted to hospital on 09/19/22 for elective VP shunt replacement. Pt questionable historian of health due to confusion and baseline cognitive deficit. At facility, pt was requiring assist for ADL's, bed mobility, transfers, and gait with RW. Pt presents with generalized weakness, confusion, impaired processing, decreased activity tolerance, poor safety awareness, and decreased balance, resulting in impaired functional mobility. Due to deficits, pt required max assist for bed mobility, min assist for transfers with RW, and CGA to ambulate short distance at bedside with RW. Increased cueing required throughout session for safety, sequencing, and attention to task. Pt with c/o mild dizziness with positional changes, but denies HA, blurred vision, or diplopia. Deficits limit the pt's ability to safely and independently perform ADL's, transfer, and ambulate. Pt will benefit from acute skilled PT services to address deficits for return to baseline function. At this time, PT recommends return to SNF rehab to continue to address deficits for return to baseline function, prior to return home.         Recommendations for follow up therapy are one component of a multi-disciplinary discharge planning process, led by the attending  physician.  Recommendations may be updated based on patient status, additional functional criteria and insurance authorization.  Follow Up Recommendations Skilled nursing-short term rehab (<3 hours/day) Can patient physically be transported by private vehicle: Yes    Assistance Recommended at Discharge Frequent or constant Supervision/Assistance  Patient can return home with the following  A little help with walking and/or transfers;A little help with bathing/dressing/bathroom;Assistance with cooking/housework;Assistance with feeding;Direct supervision/assist for medications management;Direct supervision/assist for financial management;Assist for transportation;Help with stairs or ramp for entrance    Equipment Recommendations None recommended by PT     Functional Status Assessment Patient has had a recent decline in their functional status and demonstrates the ability to make significant improvements in function in a reasonable and predictable amount of time.     Precautions / Restrictions Precautions Precautions: Posterior Hip;Fall Precaution Booklet Issued: No Restrictions Weight Bearing Restrictions: Yes RLE Weight Bearing: Weight bearing as tolerated      Mobility  Bed Mobility Overal bed mobility: Needs Assistance Bed Mobility: Sit to Supine, Supine to Sit     Supine to sit: Max assist Sit to supine: Max assist   General bed mobility comments: for trunk and BLE facilitation to perform supine<>sit transfer; questionable whether pt is limited due to strength/cognition    Transfers Overall transfer level: Needs assistance Equipment used: Rolling walker (2 wheels) Transfers: Sit to/from Stand Sit to Stand: Min assist           General transfer comment: for power to stand and controlled descent to sit EOB with RW. Multimodal cues for safety, sequencing, and hand placement. C/o mild dizziness with positional changes that subsided with time.     Ambulation/Gait Ambulation/Gait assistance: Min guard Gait Distance (Feet): 2 Feet (4-6 lateral steps towards HOB)  General Gait Details: CGA for safety to ambulate short distance at bedside. Demonstrates slowed cadence with decreased step length/foot clearance bil. Cues for safety and sequencing.     Balance Overall balance assessment: Needs assistance Sitting-balance support: Feet supported, Bilateral upper extremity supported Sitting balance-Leahy Scale: Good     Standing balance support: Bilateral upper extremity supported, During functional activity Standing balance-Leahy Scale: Fair                               Pertinent Vitals/Pain Pain Assessment Pain Assessment: Faces Faces Pain Scale: Hurts a little bit Pain Location: R hip Pain Intervention(s): Monitored during session    Home Living Family/patient expects to be discharged to:: Skilled nursing facility Living Arrangements: Other relatives;Children                      Prior Function Prior Level of Function : Patient poor historian/Family not available             Mobility Comments: Pt quesitonable historian of health due to baseline cognitive deficits. Pt reports requiring assist from facility staff for bed mobility, transfers, and gait with RW. ADLs Comments: Pt quesitonable historian of health due to baseline cognitive deficits. Pt reports requiring assist from facility staff for ADL's and IADL's.     Hand Dominance   Dominant Hand: Right    Extremity/Trunk Assessment   Upper Extremity Assessment Upper Extremity Assessment: Generalized weakness    Lower Extremity Assessment Lower Extremity Assessment: Generalized weakness       Communication   Communication: No difficulties  Cognition Arousal/Alertness: Awake/alert Behavior During Therapy: WFL for tasks assessed/performed Overall Cognitive Status: History of cognitive impairments - at baseline                                  General Comments: A&O x4 but only able to follow simple one-step commands, with increased multimodal cueing for completion of task.        General Comments General comments (skin integrity, edema, etc.): no c/o HA, diplopia, blurred vision    Exercises Other Exercises Other Exercises: Participates in bed mobility, transfers, and gait with RW. Increased assist could be due to cognition, although pt is functionally weak. Other Exercises: Pt educated re: PT role/POC, DC recommendations, safety with mobility. She verbalized understanding.   Assessment/Plan    PT Assessment Patient needs continued PT services  PT Problem List Decreased strength;Decreased range of motion;Decreased activity tolerance;Decreased balance;Decreased mobility;Decreased cognition;Decreased safety awareness;Cardiopulmonary status limiting activity;Pain       PT Treatment Interventions DME instruction;Gait training;Stair training;Functional mobility training;Therapeutic activities;Balance training;Therapeutic exercise;Neuromuscular re-education;Cognitive remediation;Patient/family education    PT Goals (Current goals can be found in the Care Plan section)  Acute Rehab PT Goals Patient Stated Goal: none stated PT Goal Formulation: With patient Time For Goal Achievement: 10/04/22 Potential to Achieve Goals: Good    Frequency Min 2X/week        AM-PAC PT "6 Clicks" Mobility  Outcome Measure Help needed turning from your back to your side while in a flat bed without using bedrails?: A Lot Help needed moving from lying on your back to sitting on the side of a flat bed without using bedrails?: A Lot Help needed moving to and from a bed to a chair (including a wheelchair)?: A Little Help needed standing up from a chair using your  arms (e.g., wheelchair or bedside chair)?: A Little Help needed to walk in hospital room?: A Little Help needed climbing 3-5 steps with a railing? : A  Lot 6 Click Score: 15    End of Session Equipment Utilized During Treatment: Gait belt;Oxygen Activity Tolerance: Patient limited by fatigue Patient left: in bed;with bed alarm set (breakfast tray set up) Nurse Communication: Mobility status PT Visit Diagnosis: History of falling (Z91.81);Difficulty in walking, not elsewhere classified (R26.2);Muscle weakness (generalized) (M62.81)    Time: 4196-2229 PT Time Calculation (min) (ACUTE ONLY): 20 min   Charges:   PT Evaluation $PT Eval Low Complexity: 1 Low PT Treatments $Therapeutic Activity: 8-22 mins       Herminio Commons, PT, DPT 12:24 PM,09/20/22 Physical Therapist - [facility]

## 2022-09-21 DIAGNOSIS — G912 (Idiopathic) normal pressure hydrocephalus: Secondary | ICD-10-CM | POA: Diagnosis not present

## 2022-09-21 DIAGNOSIS — F0390 Unspecified dementia without behavioral disturbance: Secondary | ICD-10-CM | POA: Diagnosis not present

## 2022-09-21 DIAGNOSIS — I10 Essential (primary) hypertension: Secondary | ICD-10-CM

## 2022-09-21 LAB — URINE CULTURE: Culture: NO GROWTH

## 2022-09-21 LAB — BASIC METABOLIC PANEL
Anion gap: 5 (ref 5–15)
BUN: 10 mg/dL (ref 8–23)
CO2: 31 mmol/L (ref 22–32)
Calcium: 8 mg/dL — ABNORMAL LOW (ref 8.9–10.3)
Chloride: 103 mmol/L (ref 98–111)
Creatinine, Ser: 0.5 mg/dL (ref 0.44–1.00)
GFR, Estimated: >60 mL/min (ref >60)
Glucose, Bld: 111 mg/dL — ABNORMAL HIGH (ref 70–99)
Potassium: 3.7 mmol/L (ref 3.5–5.1)
Sodium: 139 mmol/L (ref 135–145)

## 2022-09-21 LAB — CBC
HCT: 23.7 % — ABNORMAL LOW (ref 36.0–46.0)
Hemoglobin: 7.2 g/dL — ABNORMAL LOW (ref 12.0–15.0)
MCH: 27.9 pg (ref 26.0–34.0)
MCHC: 30.4 g/dL (ref 30.0–36.0)
MCV: 91.9 fL (ref 80.0–100.0)
Platelets: 533 10*3/uL — ABNORMAL HIGH (ref 150–400)
RBC: 2.58 MIL/uL — ABNORMAL LOW (ref 3.87–5.11)
RDW: 15.1 % (ref 11.5–15.5)
WBC: 10.7 10*3/uL — ABNORMAL HIGH (ref 4.0–10.5)
nRBC: 0 % (ref 0.0–0.2)

## 2022-09-21 LAB — GLUCOSE, CAPILLARY
Glucose-Capillary: 209 mg/dL — ABNORMAL HIGH (ref 70–99)
Glucose-Capillary: 94 mg/dL (ref 70–99)
Glucose-Capillary: 192 mg/dL — ABNORMAL HIGH (ref 70–99)
Glucose-Capillary: 140 mg/dL — ABNORMAL HIGH (ref 70–99)

## 2022-09-21 MED ORDER — MORPHINE SULFATE (PF) 2 MG/ML IV SOLN
1.0000 mg | INTRAVENOUS | Status: DC | PRN
  Administered 2022-09-21 – 2022-09-22 (×3): 1 mg via INTRAVENOUS
  Filled 2022-09-21 (×3): qty 1

## 2022-09-21 MED ORDER — HYDROCODONE-ACETAMINOPHEN 5-325 MG PO TABS
1.0000 | ORAL_TABLET | ORAL | Status: DC | PRN
  Administered 2022-09-21 – 2022-09-23 (×3): 1 via ORAL
  Filled 2022-09-21 (×3): qty 1

## 2022-09-21 NOTE — Progress Notes (Signed)
    Attending Progress Note  History: Amanda Porter is here for hydrocephalus and shunt malfunction.  POD2: No issues overnight POD1: Doing well.  No complaints.  Physical Exam: Vitals:   09/20/22 2330 09/21/22 0810  BP: (!) 128/55 (!) 108/48  Pulse: 86 79  Resp: 17   Temp: 98.3 F (36.8 C) 98.8 F (37.1 C)  SpO2: 100% 100%    AA Oxself, hospital, 1974 CNI No drift  Strength:5/5 throughout BUE  Dressings c/d/I  Data:  Other tests/results: n/a  Assessment/Plan:  Amanda Porter is stable after shunt removal and replacement for normal pressure hydrocephalus.  - mobilize - pain control - DVT prophylaxis - PTOT - Appreciate hospitalist involvement   Amanda Maw MD, [facility] Department of Neurosurgery

## 2022-09-21 NOTE — Plan of Care (Signed)

## 2022-09-21 NOTE — Progress Notes (Signed)
PROGRESS NOTE    Amanda Porter  WSF:[id number] DOB: 1946-06-28 DOA: 09/19/2022 PCP: Pleas Koch, NP   Assessment & Plan:   Principal Problem:   NPH (normal pressure hydrocephalus) (HCC) Active Problems:   Dementia without behavioral disturbance (HCC)   Normal pressure hydrocephalus (HCC)   S/P VP shunt   Type 2 diabetes mellitus with hyperglycemia (HCC)   COPD mixed type (HCC)   Anxiety and depression   Hyperlipidemia   Essential hypertension   Memory disturbance   Tobacco use disorder   Protein-calorie malnutrition, severe   DNR (do not resuscitate)   Shunt malfunction  Assessment and Plan: NPH: s/p ventriculoperitoneal shunt revision on 09/19/2022 with Dr. Cari Caraway and Dr. Dahlia Byes. Management per neuro surg & gen surg    DM2: HbA1c 7.2 in 05/2022, fair control. Continue on SSI w/ accuchecks   COPD:  w/o exacerbation. Continue on chronic supplemental oxygen, 3L [place]   Depression: unknown severity. Continue on home dose of buspirone, bupropion  Recent right hip arthroplasty: on 09/07/22. Norco, morphine prn for pain    Tobacco use disorder: quit smoking months ago    Dementia: continue on home dose of memantine    HTN: continue on home dose of amlodipine. IV hydralalzine prn    HLD: continue on statin   Thrombocytosis: etiology unclear, labile   Leukocytosis: likely reactive. Trending down   Normocytic anemia: H&H are labile. Will transfuse if Hb < 7.0    DVT prophylaxis: lovenox Code Status: full  Family Communication:  Disposition Plan: likely d/c to SNF  Level of care: Med-Surg  Status is: Inpatient Remains inpatient appropriate because: severity of illness    Consultants:  Hospitalist   Procedures:   Antimicrobials:    Subjective: Pt c/o hip pain   Objective: Vitals:   09/20/22 0836 09/20/22 1025 09/20/22 1541 09/20/22 2330  BP:  (!) 116/42 (!) 127/46 (!) 128/55  Pulse:  75 91 86  Resp:  18 18 17   Temp:  98.3 F (36.8 C) 98.6 F  (37 C) 98.3 F (36.8 C)  TempSrc:      SpO2: 100% 100% 100% 100%  Weight:      Height:        Intake/Output Summary (Last 24 hours) at 09/21/2022 0753 Last data filed at 09/21/2022 0440 Gross per 24 hour  Intake --  Output 2850 ml  Net -2850 ml   Filed Weights   09/19/22 0910  Weight: 47.7 kg    Examination:  General exam: Appears uncomfortable. Frail appearing  Respiratory system: clear breath sounds b/l Cardiovascular system: S1/S2+. No rubs or clicks.  Gastrointestinal system: abd is soft, NT, ND & hypoactive bowel sounds  Central nervous system: Alert and oriented to person, place only. Moves all extremities Psychiatry: Judgement and insight appears at baseline. Flat mood and affect     Data Reviewed: I have personally reviewed following labs and imaging studies  CBC: Recent Labs  Lab 09/19/22 1741 09/20/22 0807 09/21/22 0457  WBC 14.3* 14.9* 10.7*  NEUTROABS 13.5*  --   --   HGB 7.9* 7.5* 7.2*  HCT 26.0* 24.9* 23.7*  MCV 90.9 90.5 91.9  PLT 503* 532* 017*   Basic Metabolic Panel: Recent Labs  Lab 09/19/22 1741 09/20/22 0807 09/21/22 0457  NA 134* 140 139  K 3.8 4.4 3.7  CL 100 106 103  CO2 27 30 31   GLUCOSE 192* 154* 111*  BUN 17 13 10   CREATININE 0.48 0.62 0.50  CALCIUM 8.2* 8.7* 8.0*  GFR: Estimated Creatinine Clearance (by C-G formula based on SCr of 0.5 mg/dL) Female: 44.3 mL/min Female: 52.2 mL/min Liver Function Tests: No results for input(s): "AST", "ALT", "ALKPHOS", "BILITOT", "PROT", "ALBUMIN" in the last 168 hours. No results for input(s): "LIPASE", "AMYLASE" in the last 168 hours. No results for input(s): "AMMONIA" in the last 168 hours. Coagulation Profile: No results for input(s): "INR", "PROTIME" in the last 168 hours. Cardiac Enzymes: No results for input(s): "CKTOTAL", "CKMB", "CKMBINDEX", "TROPONINI" in the last 168 hours. BNP (last 3 results) Recent Labs    04/04/22 1025  PROBNP 13.0   HbA1C: No results for  input(s): "HGBA1C" in the last 72 hours. CBG: Recent Labs  Lab 09/19/22 2312 09/20/22 0743 09/20/22 1135 09/20/22 1542 09/20/22 2144  GLUCAP 208* 144* 175* 196* 131*   Lipid Profile: No results for input(s): "CHOL", "HDL", "LDLCALC", "TRIG", "CHOLHDL", "LDLDIRECT" in the last 72 hours. Thyroid Function Tests: No results for input(s): "TSH", "T4TOTAL", "FREET4", "T3FREE", "THYROIDAB" in the last 72 hours. Anemia Panel: No results for input(s): "VITAMINB12", "FOLATE", "FERRITIN", "TIBC", "IRON", "RETICCTPCT" in the last 72 hours. Sepsis Labs: No results for input(s): "PROCALCITON", "LATICACIDVEN" in the last 168 hours.  No results found for this or any previous visit (from the past 240 hour(s)).        Radiology Studies: No results found.      Scheduled Meds:  amLODipine  2.5 mg Oral Daily   arformoterol  15 mcg Nebulization BID   And   umeclidinium bromide  1 puff Inhalation Daily   buPROPion  300 mg Oral Q breakfast   busPIRone  30 mg Oral QHS   busPIRone  45 mg Oral q AM   Chlorhexidine Gluconate Cloth  6 each Topical Daily   conjugated estrogens  1 Applicatorful Vaginal Daily   enoxaparin (LOVENOX) injection  40 mg Subcutaneous Q24H   feeding supplement  237 mL Oral BID BM   folic acid  1 mg Oral Daily   insulin aspart  0-15 Units Subcutaneous TID WC   insulin aspart  0-5 Units Subcutaneous QHS   memantine  5 mg Oral BID   multivitamin with minerals  1 tablet Oral Daily   rosuvastatin  20 mg Oral QPM   tamsulosin  0.4 mg Oral Daily   Continuous Infusions:  0.9 % NaCl with KCl 20 mEq / L 75 mL/hr at 09/21/22 0539     LOS: 2 days    Time spent: 25 mins     Wyvonnia Dusky, MD [facility] Pager [phone]  If 7PM-7AM, please contact night-coverage [email or web address] 09/21/2022, 7:53 AM

## 2022-09-22 ENCOUNTER — Encounter: Payer: Self-pay | Admitting: Neurosurgery

## 2022-09-22 DIAGNOSIS — G912 (Idiopathic) normal pressure hydrocephalus: Secondary | ICD-10-CM | POA: Diagnosis not present

## 2022-09-22 DIAGNOSIS — F0390 Unspecified dementia without behavioral disturbance: Secondary | ICD-10-CM | POA: Diagnosis not present

## 2022-09-22 DIAGNOSIS — I10 Essential (primary) hypertension: Secondary | ICD-10-CM | POA: Diagnosis not present

## 2022-09-22 LAB — GLUCOSE, CAPILLARY
Glucose-Capillary: 142 mg/dL — ABNORMAL HIGH (ref 70–99)
Glucose-Capillary: 162 mg/dL — ABNORMAL HIGH (ref 70–99)
Glucose-Capillary: 136 mg/dL — ABNORMAL HIGH (ref 70–99)
Glucose-Capillary: 163 mg/dL — ABNORMAL HIGH (ref 70–99)

## 2022-09-22 LAB — CBC
HCT: 23.6 % — ABNORMAL LOW (ref 36.0–46.0)
Hemoglobin: 7.1 g/dL — ABNORMAL LOW (ref 12.0–15.0)
MCH: 27.1 pg (ref 26.0–34.0)
MCHC: 30.1 g/dL (ref 30.0–36.0)
MCV: 90.1 fL (ref 80.0–100.0)
Platelets: 503 10*3/uL — ABNORMAL HIGH (ref 150–400)
RBC: 2.62 MIL/uL — ABNORMAL LOW (ref 3.87–5.11)
RDW: 15 % (ref 11.5–15.5)
WBC: 9.2 10*3/uL (ref 4.0–10.5)
nRBC: 0 % (ref 0.0–0.2)

## 2022-09-22 LAB — BASIC METABOLIC PANEL
Anion gap: 8 (ref 5–15)
BUN: 8 mg/dL (ref 8–23)
CO2: 30 mmol/L (ref 22–32)
Calcium: 8.8 mg/dL — ABNORMAL LOW (ref 8.9–10.3)
Chloride: 104 mmol/L (ref 98–111)
Creatinine, Ser: 0.53 mg/dL (ref 0.44–1.00)
GFR, Estimated: >60 mL/min (ref >60)
Glucose, Bld: 115 mg/dL — ABNORMAL HIGH (ref 70–99)
Potassium: 3.8 mmol/L (ref 3.5–5.1)
Sodium: 142 mmol/L (ref 135–145)

## 2022-09-22 NOTE — NC FL2 (Signed)
[place] LEVEL OF CARE SCREENING TOOL     IDENTIFICATION  Patient Name: Amanda Porter Birthdate: 1946/01/30 Sex: adult Admission Date (Current Location): 09/19/2022  [place] and [place] Number:  Engineering geologist and Address:  [facility], [address], [place], [place] [address]      Provider Number: [id number]  Attending Physician Name and Address:  Meade Maw, MD  Relative Name and Phone Number:  [name] Daughter [phone]    Current Level of Care: Hospital Recommended Level of Care: [place] Prior Approval Number:    Date Approved/Denied:   PASRR Number: [id number]  Discharge Plan: SNF    Current Diagnoses: Patient Active Problem List   Diagnosis Date Noted   Shunt malfunction    Closed displaced fracture of right femoral neck ([place]) 09/06/2022   Preoperative clearance 08/18/2022   Multiple lung nodules 08/03/2022   Acute urinary retention 08/01/2022   Normocytic anemia 07/29/2022   NPH (normal pressure hydrocephalus) ([place]) 07/25/2022   Recurrent UTI 06/24/2022   Recurrent falls 06/24/2022   DNR (do not resuscitate) 06/24/2022   S/P VP shunt 04/22/2022   Subdural hematoma (HCC) 04/16/2022   Pleural effusion on left 04/08/2022   Decreased urine output 04/04/2022   Vitamin D deficiency 04/04/2022   Other fatigue 04/04/2022   Hypokalemia 04/04/2022   Urinary retention 12/13/2021   Wrist fracture, closed, left, sequela 12/13/2021   Protein-calorie malnutrition, severe 11/07/2021   Tardive dyskinesia 02/01/2020   Cervical spondylolysis 05/24/2019   Chronic respiratory failure with hypoxia ([place]) 02/25/2019   Postural dizziness 08/11/2018   Iron deficiency anemia 07/02/2018   Anemia 04/12/2018   Type 2 diabetes mellitus with hyperglycemia ([place]) 02/18/2018   Pain of left lower extremity 11/26/2017   Memory disturbance 11/20/2017   Right carotid bruit 11/20/2017   Hyperlipidemia 09/14/2017    COPD mixed type ([place]) 09/14/2017   Dementia without behavioral disturbance ([place]) 09/14/2017   Normal pressure hydrocephalus ([place]) 09/14/2017   Dysphagia 09/14/2017   Essential hypertension 09/14/2017   History of CVA (cerebrovascular accident) 09/14/2017   Anxiety and depression 09/14/2017   Tobacco use disorder 12/10/2016    Orientation RESPIRATION BLADDER Height & Weight     Self, Situation, Place  Normal Indwelling catheter Weight: 47.7 kg Height:  5\' 2"  (157.5 cm)  BEHAVIORAL SYMPTOMS/MOOD NEUROLOGICAL BOWEL NUTRITION STATUS      Continent Diet (see DC summary)  AMBULATORY STATUS COMMUNICATION OF NEEDS Skin   Extensive Assist Verbally Normal                       Personal Care Assistance Level of Assistance  Bathing, Feeding, Dressing Bathing Assistance: Maximum assistance Feeding assistance: Limited assistance Dressing Assistance: Maximum assistance     Functional Limitations Info  Sight, Hearing, Speech Sight Info: Impaired Hearing Info: Adequate Speech Info: Adequate    SPECIAL CARE FACTORS FREQUENCY  PT (By licensed PT), OT (By licensed OT)     PT Frequency: 5 times per week OT Frequency: 5 times per week            Contractures Contractures Info: Not present    Additional Factors Info    Code Status Info: FULl code Allergies Info: Lexapro (Escitalopram Oxalate), Neosporin  (Neomycin-polymyxin-gramicidin), Neosporin (Bacitracin-polymyxin B)           Current Medications (09/22/2022):  This is the current hospital active medication list Current Facility-Administered Medications  Medication Dose Route Frequency Provider Last Rate Last Admin  acetaminophen (TYLENOL) tablet 650 mg  650 mg Oral Q6H PRN Cox, Amy N, DO   650 mg at 09/20/22 0807   Or   acetaminophen (TYLENOL) suppository 650 mg  650 mg Rectal Q6H PRN Cox, Amy N, DO       albuterol (PROVENTIL) (2.5 MG/3ML) 0.083% nebulizer solution 3 mL  3 mL Inhalation Q4H PRN Meade Maw,  MD       amLODipine (NORVASC) tablet 2.5 mg  2.5 mg Oral Daily Meade Maw, MD   2.5 mg at 09/22/22 0932   arformoterol (BROVANA) nebulizer solution 15 mcg  15 mcg Nebulization BID Meade Maw, MD       And   umeclidinium bromide (INCRUSE ELLIPTA) 62.5 MCG/ACT 1 puff  1 puff Inhalation Daily Meade Maw, MD   1 puff at 09/22/22 0936   buPROPion (WELLBUTRIN XL) 24 hr tablet 300 mg  300 mg Oral Q breakfast Meade Maw, MD   300 mg at 09/22/22 0934   busPIRone (BUSPAR) tablet 30 mg  30 mg Oral QHS Meade Maw, MD   30 mg at 09/21/22 2048   busPIRone (BUSPAR) tablet 45 mg  45 mg Oral q AM Meade Maw, MD   45 mg at 09/22/22 0932   Chlorhexidine Gluconate Cloth 2 % PADS 6 each  6 each Topical Daily Meade Maw, MD   6 each at 09/22/22 9629   clonazePAM (KLONOPIN) tablet 0.5 mg  0.5 mg Oral Daily PRN Meade Maw, MD       conjugated estrogens (PREMARIN) vaginal cream 1 Applicatorful  1 Applicatorful Vaginal Daily Meade Maw, MD   1 Applicatorful at 52/84/13 0938   dextrose 50 % solution 25 mL  25 mL Intravenous PRN Cox, Amy N, DO       enoxaparin (LOVENOX) injection 40 mg  40 mg Subcutaneous Q24H Meade Maw, MD   40 mg at 09/22/22 0936   feeding supplement (ENSURE ENLIVE / ENSURE PLUS) liquid 237 mL  237 mL Oral BID BM Cox, Amy N, DO   237 mL at 24/40/10 2725   folic acid (FOLVITE) tablet 1 mg  1 mg Oral Daily Meade Maw, MD   1 mg at 09/22/22 3664   hydrALAZINE (APRESOLINE) injection 5 mg  5 mg Intravenous Q6H PRN Cox, Amy N, DO       HYDROcodone-acetaminophen (NORCO/VICODIN) 5-325 MG per tablet 1-2 tablet  1-2 tablet Oral Q4H PRN Wyvonnia Dusky, MD   1 tablet at 09/22/22 0547   insulin aspart (novoLOG) injection 0-15 Units  0-15 Units Subcutaneous TID WC Cox, Amy N, DO   2 Units at 09/22/22 0933   insulin aspart (novoLOG) injection 0-5 Units  0-5 Units Subcutaneous QHS Cox, Amy N, DO   2 Units at 09/19/22 2318    memantine (NAMENDA) tablet 5 mg  5 mg Oral BID Meade Maw, MD   5 mg at 09/22/22 0932   morphine (PF) 2 MG/ML injection 1 mg  1 mg Intravenous Q4H PRN Wyvonnia Dusky, MD   1 mg at 09/21/22 1054   multivitamin with minerals tablet 1 tablet  1 tablet Oral Daily Cox, Amy N, DO   1 tablet at 09/22/22 0939   ondansetron (ZOFRAN) tablet 4 mg  4 mg Oral Q4H PRN Meade Maw, MD       Or   ondansetron [facility]) injection 4 mg  4 mg Intravenous Q4H PRN Meade Maw, MD   4 mg at 09/19/22 2013   promethazine (PHENERGAN) tablet 12.5-25 mg  12.5-25 mg Oral Q4H PRN Meade Maw, MD       rosuvastatin (CRESTOR) tablet 20 mg  20 mg Oral QPM Meade Maw, MD   20 mg at 09/21/22 2049   senna-docusate (Senokot-S) tablet 1 tablet  1 tablet Oral QHS PRN Meade Maw, MD       tamsulosin [facility]) capsule 0.4 mg  0.4 mg Oral Daily Meade Maw, MD   0.4 mg at 09/22/22 0932     Discharge Medications: Please see discharge summary for a list of discharge medications.  Relevant Imaging Results:  Relevant Lab Results:   Additional Information SS# [id number]  Conception Oms, RN

## 2022-09-22 NOTE — Anesthesia Postprocedure Evaluation (Signed)
Anesthesia Post Note  Patient: Amanda Porter  Procedure(s) Performed: VENTRICULOPERITONEAL SHUNT REVISION  Patient location during evaluation: PACU Anesthesia Type: General Level of consciousness: awake and alert Pain management: pain level controlled Vital Signs Assessment: post-procedure vital signs reviewed and stable Respiratory status: spontaneous breathing, nonlabored ventilation, respiratory function stable and patient connected to nasal cannula oxygen Cardiovascular status: blood pressure returned to baseline and stable Postop Assessment: no apparent nausea or vomiting Anesthetic complications: no   No notable events documented.   Last Vitals:  Vitals:   09/22/22 0547 09/22/22 0852  BP: (!) 106/59 136/60  Pulse: 96 96  Resp: 16 16  Temp: (!) 36.4 C 37.1 C  SpO2: 100% 100%    Last Pain:  Vitals:   09/22/22 5537  TempSrc:   PainSc: 2                  Martha Clan

## 2022-09-22 NOTE — TOC Progression Note (Signed)
Transition of Care [facility]) - Progression Note    Patient Details  Name: Amanda Porter MRN: [id number] Date of Birth: [birth year]  Transition of Care [facility]) CM/SW [place], RN Phone Number: 09/22/2022, 2:09 PM  Clinical Narrative:     Spoke with the Daughter [name], She stated that she is concerned that with her WBC low she would not have the strength to work with PT, she wants the WBC treated, I Explained that we would have to choose A bed to hold it And to get Insurance approval , she stated that she is not comfortable with her going to a facility without these being treated. She feels that she is not ready to DC , she then mentioned that she was told the other day that there was blood in the urine and she wants to know where that was coming from, I explained I would alert the physician of her concerns but also that I would need to know what facility she would like to go to so that we had a DC plan when the time came, she stated that she would research these facilities and let me know       Expected Discharge Plan and Services           Expected Discharge Date: 09/21/22                                     Social Determinants of Health (SDOH) Interventions    Readmission Risk Interventions     No data to display

## 2022-09-22 NOTE — Progress Notes (Signed)
Physical Therapy Treatment Patient Details Name: Amanda Porter MRN: [id number] DOB: 12-13-1945 Today's Date: 09/22/2022   History of Present Illness Pt is a 76 year old female admitted with a shunt malfunction and normal pressure hydrocephalus now Status post ventriculoperitoneal shunt revision on 09/19/2022 ; PMH significant for  normal pressure hydrocephalus, s/p VP shunt placement and shunt malfunction, COPD, chronic respiratory failure, history of CVA, depression, dementia, diabetes mellitus type 2, dysphagia, dyslipidemia came to ED with frequent falls.    PT Comments    Patient alert, oriented to self only at start of session. Did complain of headache pain throughout, RN notified. The patient was able to perform supine to sit with CGA, and sat for several minutes with supervision for medication administration from RN. Pt needed extended time and repetition to follow all one step commands. Sit <> stand three times, CGA for each with cues for hand placement and RW. minA to step pivot to recliner for RW management. She ambulated ~82ft with chair follow, did have complaints of dizziness (HR and spO2 WFLs on 3L). Pt up in chair with needs in reach at end of session. The patient would benefit from further skilled PT intervention to continue to progress towards goals. Recommendation remains appropriate.        Recommendations for follow up therapy are one component of a multi-disciplinary discharge planning process, led by the attending physician.  Recommendations may be updated based on patient status, additional functional criteria and insurance authorization.  Follow Up Recommendations  Skilled nursing-short term rehab (<3 hours/day) Can patient physically be transported by private vehicle: Yes   Assistance Recommended at Discharge Frequent or constant Supervision/Assistance  Patient can return home with the following A little help with walking and/or transfers;A little help with  bathing/dressing/bathroom;Assistance with cooking/housework;Assistance with feeding;Direct supervision/assist for medications management;Direct supervision/assist for financial management;Assist for transportation;Help with stairs or ramp for entrance   Equipment Recommendations  None recommended by PT    Recommendations for Other Services       Precautions / Restrictions Precautions Precautions: Posterior Hip;Fall Precaution Booklet Issued: No Restrictions Weight Bearing Restrictions: Yes RLE Weight Bearing: Weight bearing as tolerated     Mobility  Bed Mobility Overal bed mobility: Needs Assistance Bed Mobility: Supine to Sit     Supine to sit: Min guard     General bed mobility comments: significant cueing to square up at EOB    Transfers Overall transfer level: Needs assistance Equipment used: Rolling walker (2 wheels) Transfers: Sit to/from Stand Sit to Stand: Min guard   Step pivot transfers: Min assist       General transfer comment: assist for RW management. constant cues, difficulty with sequencing of task, needed re-direction    Ambulation/Gait Ambulation/Gait assistance: Min guard Gait Distance (Feet): 4 Feet Assistive device: Rolling walker (2 wheels)         General Gait Details: decreased step length/height bilaterally, no LOB   Stairs             Wheelchair Mobility    Modified Rankin (Stroke Patients Only)       Balance Overall balance assessment: Needs assistance Sitting-balance support: Feet supported, Single extremity supported Sitting balance-Leahy Scale: Good Sitting balance - Comments: able to sit EOB and take medications from RN, supervision   Standing balance support: Reliant on assistive device for balance Standing balance-Leahy Scale: Fair  Cognition Arousal/Alertness: Awake/alert Behavior During Therapy: WFL for tasks assessed/performed Overall Cognitive Status: No  family/caregiver present to determine baseline cognitive functioning Area of Impairment: Orientation, Following commands, Safety/judgement, Memory, Problem solving                 Orientation Level: Disoriented to, Time, Situation, Place   Memory: Decreased short-term memory Following Commands: Follows one step commands with increased time Safety/Judgement: Decreased awareness of deficits   Problem Solving: Slow processing, Decreased initiation, Difficulty sequencing, Requires tactile cues, Requires verbal cues          Exercises      General Comments        Pertinent Vitals/Pain Pain Assessment Pain Assessment: Faces Faces Pain Scale: Hurts a little bit Pain Location: Head Pain Descriptors / Indicators: Aching Pain Intervention(s): Monitored during session, Repositioned, Limited activity within patient's tolerance, Patient requesting pain meds-RN notified    Home Living                          Prior Function            PT Goals (current goals can now be found in the care plan section) Progress towards PT goals: Progressing toward goals    Frequency    Min 2X/week      PT Plan Current plan remains appropriate    Co-evaluation              AM-PAC PT "6 Clicks" Mobility   Outcome Measure  Help needed turning from your back to your side while in a flat bed without using bedrails?: A Little Help needed moving from lying on your back to sitting on the side of a flat bed without using bedrails?: A Little Help needed moving to and from a bed to a chair (including a wheelchair)?: A Little Help needed standing up from a chair using your arms (e.g., wheelchair or bedside chair)?: A Little Help needed to walk in hospital room?: A Little Help needed climbing 3-5 steps with a railing? : A Lot 6 Click Score: 17    End of Session Equipment Utilized During Treatment: Gait belt;Oxygen (3L) Activity Tolerance: Patient tolerated treatment  well Patient left: in chair;with call bell/phone within reach;with chair alarm set Nurse Communication: Mobility status;Other (comment) (pt complaints of a headache) PT Visit Diagnosis: History of falling (Z91.81);Difficulty in walking, not elsewhere classified (R26.2);Muscle weakness (generalized) (M62.81)     Time: 0272-5366 PT Time Calculation (min) (ACUTE ONLY): 27 min  Charges:  $Therapeutic Activity: 23-37 mins                     Lieutenant Diego PT, DPT 11:35 AM,09/22/22

## 2022-09-22 NOTE — Plan of Care (Signed)

## 2022-09-22 NOTE — TOC Progression Note (Addendum)
Transition of Care [facility]) - Progression Note    Patient Details  Name: UNDREA ARCHBOLD MRN: [id number] Date of Birth: 1946-06-13  Transition of Care [facility]) CM/SW [place], RN Phone Number: 09/22/2022, 2:42 PM  Clinical Narrative:     Daughter [name] notified me of the bed choice to go to [facility] Notified [place]  Ins pending ref number [id number]      Expected Discharge Plan and Services           Expected Discharge Date: 09/21/22                                     Social Determinants of Health (SDOH) Interventions    Readmission Risk Interventions     No data to display

## 2022-09-22 NOTE — Progress Notes (Signed)
Occupational Therapy Treatment Patient Details Name: Amanda Porter MRN: [id number] DOB: 1946-07-02 Today's Date: 09/22/2022   History of present illness Pt is a 76 year old female admitted with a shunt malfunction and normal pressure hydrocephalus now Status post ventriculoperitoneal shunt revision on 09/19/2022 ; PMH significant for  normal pressure hydrocephalus, s/p VP shunt placement and shunt malfunction, COPD, chronic respiratory failure, history of CVA, depression, dementia, diabetes mellitus type 2, dysphagia, dyslipidemia came to ED with frequent falls.   OT comments  Patient in recliner upon arrival and agreeable to OT services. BP monitored during session 125/53. Left lateral lean observed while sitting in recliner. Patient unable to self-correct posture or follow simple 1-step commands throughout session. Only oriented to self and place. Patient required multimodal cues to transfer sit> stand (unsuccessful) ; patient with difficulty initiating task; Patient required max A to perform lateral scoot to bed. Mod A required for bed mobility sit> supine. Patient left in bed with call bell in reach, bed alarm set, and all needs met.    Recommendations for follow up therapy are one component of a multi-disciplinary discharge planning process, led by the attending physician.  Recommendations may be updated based on patient status, additional functional criteria and insurance authorization.    Follow Up Recommendations  Skilled nursing-short term rehab (<3 hours/day)    Assistance Recommended at Discharge Frequent or constant Supervision/Assistance  Patient can return home with the following  Help with stairs or ramp for entrance;A little help with walking and/or transfers;Direct supervision/assist for financial management;Direct supervision/assist for medications management;Assist for transportation;A little help with bathing/dressing/bathroom   Equipment Recommendations  Other (comment)  (Defer to next venue of care.)    Recommendations for Other Services      Precautions / Restrictions Precautions Precautions: Posterior Hip;Fall Precaution Booklet Issued: No Restrictions Weight Bearing Restrictions: Yes RLE Weight Bearing: Weight bearing as tolerated       Mobility Bed Mobility Overal bed mobility: Needs Assistance Bed Mobility: Sit to Supine       Sit to supine: Mod assist   General bed mobility comments: Patient in recliner upon arrival.    Transfers Overall transfer level: Needs assistance Equipment used: None, Rolling walker (2 wheels) Transfers: Bed to chair/wheelchair/BSC            Lateral/Scoot Transfers: Max assist General transfer comment: Patient unable to follow any commands when attempting to stand. Unable to initiate task     Balance     Sitting balance-Leahy Scale: Poor Sitting balance - Comments: Patient with left later lean observed while sitting in chair, unable to self correct posture.       Standing balance comment: unable to initiate task.                           ADL either performed or assessed with clinical judgement    Extremity/Trunk Assessment Upper Extremity Assessment Upper Extremity Assessment: Generalized weakness   Lower Extremity Assessment Lower Extremity Assessment: Generalized weakness        Vision Patient Visual Report: No change from baseline            Cognition Arousal/Alertness: Awake/alert Behavior During Therapy: WFL for tasks assessed/performed Overall Cognitive Status: No family/caregiver present to determine baseline cognitive functioning Area of Impairment: Orientation, Following commands, Safety/judgement, Memory, Problem solving                 Orientation Level: Disoriented to, Time, Situation, Place  Memory: Decreased short-term memory Following Commands: Follows one step commands with increased time Safety/Judgement: Decreased awareness of deficits    Problem Solving: Slow processing, Decreased initiation, Difficulty sequencing, Requires tactile cues, Requires verbal cues                     Pertinent Vitals/ Pain       Pain Assessment Pain Assessment: Faces Faces Pain Scale: Hurts a little bit Pain Location: Head Pain Descriptors / Indicators: Aching Pain Intervention(s): Monitored during session, Repositioned, Limited activity within patient's tolerance   Frequency  Min 2X/week        Progress Toward Goals  OT Goals(current goals can now be found in the care plan section)  Progress towards OT goals: Progressing toward goals  Acute Rehab OT Goals Patient Stated Goal: to get stronger OT Goal Formulation: With patient Time For Goal Achievement: 10/04/22 Potential to Achieve Goals: Good  Plan Discharge plan remains appropriate       AM-PAC OT "6 Clicks" Daily Activity     Outcome Measure   Help from another person eating meals?: None Help from another person taking care of personal grooming?: None Help from another person toileting, which includes using toliet, bedpan, or urinal?: A Lot Help from another person bathing (including washing, rinsing, drying)?: A Lot Help from another person to put on and taking off regular upper body clothing?: A Little Help from another person to put on and taking off regular lower body clothing?: A Lot 6 Click Score: 17    End of Session Equipment Utilized During Treatment: Rolling walker (2 wheels);Other (comment)  OT Visit Diagnosis: Repeated falls (R29.6);Other abnormalities of gait and mobility (R26.89)   Activity Tolerance Patient limited by lethargy   Patient Left in bed;with call bell/phone within reach;with bed alarm set   Nurse Communication Mobility status        Time: 9355-2174 OT Time Calculation (min): 19 min  Charges: OT General Charges $OT Visit: 1 Visit OT Treatments $Therapeutic Activity: 8-22 mins   Tiana Sivertson, OTS 09/22/2022, 12:42 PM

## 2022-09-22 NOTE — TOC Progression Note (Signed)
Transition of Care [facility]) - Progression Note    Patient Details  Name: Amanda Porter MRN: [id number] Date of Birth: 30-Jun-1946  Transition of Care [facility]) CM/SW [place], RN Phone Number: 09/22/2022, 9:43 AM  Clinical Narrative:    FL2 completed, PASSR obtained, [employer]search snent with the note that they will need a payment plan for COpay        Expected Discharge Plan and Services           Expected Discharge Date: 09/21/22                                     Social Determinants of Health (SDOH) Interventions    Readmission Risk Interventions     No data to display

## 2022-09-22 NOTE — Care Management Important Message (Signed)
Important Message  Patient Details  Name: Amanda Porter MRN: [id number] Date of Birth: September 15, 1946   Medicare Important Message Given:  Yes     Dannette Barbara 09/22/2022, 12:57 PM

## 2022-09-22 NOTE — Progress Notes (Signed)
    Attending Progress Note  History: Amanda Porter is here for hydrocephalus and shunt malfunction.  POD3: Doing well. POD2: No issues overnight POD1: Doing well.  No complaints.  Physical Exam: Vitals:   09/22/22 0547 09/22/22 0852  BP: (!) 106/59 136/60  Pulse: 96 96  Resp: 16 16  Temp: (!) 97.5 F (36.4 C) 98.8 F (37.1 C)  SpO2: 100% 100%    AA Ox self, hospital,  CNI No drift  Strength:5/5 throughout BUE  Incisions c/d/I  Data:  Other tests/results: n/a  Assessment/Plan:  Amanda Porter is stable after shunt removal and replacement for normal pressure hydrocephalus.  - mobilize - pain control - DVT prophylaxis - PTOT - Appreciate hospitalist involvement   Meade Maw MD, [facility] Department of Neurosurgery

## 2022-09-22 NOTE — TOC Progression Note (Signed)
Transition of Care [facility]) - Progression Note    Patient Details  Name: Amanda Porter MRN: [id number] Date of Birth: 21-Dec-1945  Transition of Care [facility]) CM/SW [place], RN Phone Number: 09/22/2022, 11:59 AM  Clinical Narrative:    Attempted to reach the patients daughter [name] at [phone], UNAble to reach, left a vm asking for a call back, have a few bed offers to review        Expected Discharge Plan and Services           Expected Discharge Date: 09/21/22                                     Social Determinants of Health (SDOH) Interventions    Readmission Risk Interventions     No data to display

## 2022-09-22 NOTE — Progress Notes (Signed)
Nutrition Follow-up  DOCUMENTATION CODES:   Not applicable  INTERVENTION:   -Continue Ensure Enlive po BID, each supplement provides 350 kcal and 20 grams of protein.  -MVI with minerals daily -Liberalize diet to carb modified for wider variety of meal selections  NUTRITION DIAGNOSIS:   Increased nutrient needs related to post-op healing as evidenced by estimated needs.  Ongoing  GOAL:   Patient will meet greater than or equal to 90% of their needs  Progressing   MONITOR:   PO intake, Supplement acceptance, Labs, Weight trends, I & O's  REASON FOR ASSESSMENT:   Consult Assessment of nutrition requirement/status  ASSESSMENT:   76 year old female with medical diagnosis of normal pressure hydrocephalus, status post VP shunt placement and shunt malfunction, COPD, chronic respiratory failure on 3 L nasal cannula baseline, history of CVA, depression, dementia, non-insulin-dependent diabetes mellitus, dysphagia, dyslipidemia presented for scheduled surgical procedure with Dr. Cari Caraway and Dr. Perrin Maltese for ventriculoperitoneal shunt revision.  10/20- s/p removal and replacement of VP shunt  Reviewed I/O's: -755 ml x 24 hours and -2.3 L since admission  UOP: 3.1 L x 24 hours   Pt sitting up in bed at time of visit, pleasant and in good spirits. Pt reports having a good appetite, states she ate a pancake and sausage patty this morning at breakfast. Noted meal completions 0-100%. Pt also consumed an Ensure supplement, which was empty on tray table.   PTA pt reports good appetite and consumes 2 meals per day (Breakfast: eggs and pancakes and Dinner: meat, starch, and vegetable).   Reviewed wt hx; wt has been stable over the past month. Pt denies any weight loss.   Discussed importance of good meal and supplement intake to promote healing. Pt amenable to continue supplements. Provided pt with diet soda per her request.   Medications reviewed and include folic acid  Lab Results   Component Value Date   HGBA1C 7.6 (H) 04/04/2022   PTA DM medications are 0-9 units insulin aspart TID and 2.5 mg glipizide daily. Per ADA's Standards of Medical Care of Diabetes, glycemic targets for older adults who have multiple co-morbidities, cognitive impairments, and functional dependence should be less stringent (Hgb A1c <8.0-8.5).    Labs reviewed: CBGS: 94-209 (inpatient orders for glycemic control are 0-15 units insulin aspart TID with meals and 0-5 units inuslin aspart daily at bedtime).    NUTRITION - FOCUSED PHYSICAL EXAM:  Flowsheet Row Most Recent Value  Orbital Region No depletion  Upper Arm Region Mild depletion  Thoracic and Lumbar Region No depletion  Buccal Region No depletion  Temple Region No depletion  Clavicle Bone Region Mild depletion  Clavicle and Acromion Bone Region No depletion  Scapular Bone Region No depletion  Dorsal Hand No depletion  Patellar Region Mild depletion  Anterior Thigh Region Mild depletion  Posterior Calf Region Mild depletion  Edema (RD Assessment) None  Hair Reviewed  Eyes Reviewed  Mouth Reviewed  Skin Reviewed  Nails Reviewed       Diet Order:   Diet Order             Diet heart healthy/carb modified Room service appropriate? Yes; Fluid consistency: Thin  Diet effective now                   EDUCATION NEEDS:   Not appropriate for education at this time  Skin:  Skin Assessment: Skin Integrity Issues: Skin Integrity Issues:: Incisions Incisions: 10/20: head, 10/20: abdomen, 10/8: right thigh  Last BM:  Unknown  Height:   Ht Readings from Last 1 Encounters:  09/19/22 5\' 2"  (1.575 m)    Weight:   Wt Readings from Last 1 Encounters:  09/19/22 47.7 kg    Ideal Body Weight:  50 kg  BMI:  Body mass index is 19.22 kg/m.  Estimated Nutritional Needs:   Kcal:  1450-1650  Protein:  70-85 grams  Fluid:  > 1.4 L    Loistine Chance, RD, LDN, [place] Registered Dietitian II Certified Diabetes Care and  Education Specialist Please refer to [facility] for RD and/or RD on-call/weekend/after hours pager

## 2022-09-22 NOTE — Progress Notes (Signed)
PROGRESS NOTE    Amanda Porter  PPI:[id number] DOB: 10/12/1946 DOA: 09/19/2022 PCP: Pleas Koch, NP   Assessment & Plan:   Principal Problem:   NPH (normal pressure hydrocephalus) (HCC) Active Problems:   Dementia without behavioral disturbance (HCC)   Normal pressure hydrocephalus (HCC)   S/P VP shunt   Type 2 diabetes mellitus with hyperglycemia (HCC)   COPD mixed type (HCC)   Anxiety and depression   Hyperlipidemia   Essential hypertension   Memory disturbance   Tobacco use disorder   Protein-calorie malnutrition, severe   DNR (do not resuscitate)   Shunt malfunction  Assessment and Plan: NPH: s/p ventriculoperitoneal shunt revision on 09/19/2022 with Dr. Cari Caraway and Dr. Dahlia Byes. Management per neuro surg & gen surg    DM2: HbA1c 7.2 in 05/2022, fair control. Continue on SSI w/ accuchecks   COPD:  w/o exacerbation. Continue on chronic supplemental oxygen, 3L [place]   Depression: unknown severity. Continue on home dose of bupropion, buspirone   Recent right hip arthroplasty: on 09/07/22. Norco, morphine prn for pain   Tobacco use disorder: quit smoking months ago    Dementia: continue on home dose of memantine    HTN: continue on home dose of amlodipine. IV hydralazine prn    HLD: continue on statin   Thrombocytosis: etiology unclear, labile   Leukocytosis: WNL today    Normocytic anemia: H&H are trending down. Possible dilution. Will transfuse if Hb < 7.0    DVT prophylaxis: lovenox Code Status: full  Family Communication:  Disposition Plan: likely d/c to SNF  Level of care: Med-Surg  Status is: Inpatient Remains inpatient appropriate because: waiting on SNF placement     Consultants:  Hospitalist   Procedures:   Antimicrobials:    Subjective: Pt c/o fatigue   Objective: Vitals:   09/21/22 0810 09/21/22 1133 09/21/22 1549 09/21/22 2055  BP: (!) 108/48 (!) 127/50 (!) 123/49 (!) 131/51  Pulse: 79 84 92 95  Resp:  16 16   Temp: 98.8 F  (37.1 C) 99.1 F (37.3 C) 98.7 F (37.1 C) 98.2 F (36.8 C)  TempSrc:    Oral  SpO2: 100% 99% 100% 100%  Weight:      Height:        Intake/Output Summary (Last 24 hours) at 09/22/2022 0806 Last data filed at 09/22/2022 0336 Gross per 24 hour  Intake 2294.57 ml  Output 3050 ml  Net -755.43 ml   Filed Weights   09/19/22 0910  Weight: 47.7 kg    Examination:  General exam: Appears calm & comfortable. Frail appearing  Respiratory system: clear breath sounds b/l  Cardiovascular system: S1 & S2+. No rubs or clicks   Gastrointestinal system: Abd is soft, NT, ND & normal bowel sounds  Central nervous system: alert and oriented to person only. Moves all extremities  Psychiatry: Judgement and insight appears at baseline. Appropriate mood and affect     Data Reviewed: I have personally reviewed following labs and imaging studies  CBC: Recent Labs  Lab 09/19/22 1741 09/20/22 0807 09/21/22 0457 09/22/22 0325  WBC 14.3* 14.9* 10.7* 9.2  NEUTROABS 13.5*  --   --   --   HGB 7.9* 7.5* 7.2* 7.1*  HCT 26.0* 24.9* 23.7* 23.6*  MCV 90.9 90.5 91.9 90.1  PLT 503* 532* 533* 063*   Basic Metabolic Panel: Recent Labs  Lab 09/19/22 1741 09/20/22 0807 09/21/22 0457 09/22/22 0325  NA 134* 140 139 142  K 3.8 4.4 3.7 3.8  CL  100 106 103 104  CO2 27 30 31 30   GLUCOSE 192* 154* 111* 115*  BUN 17 13 10 8   CREATININE 0.48 0.62 0.50 0.53  CALCIUM 8.2* 8.7* 8.0* 8.8*   GFR: Estimated Creatinine Clearance (by C-G formula based on SCr of 0.53 mg/dL) Female: 44.3 mL/min Female: 52.2 mL/min Liver Function Tests: No results for input(s): "AST", "ALT", "ALKPHOS", "BILITOT", "PROT", "ALBUMIN" in the last 168 hours. No results for input(s): "LIPASE", "AMYLASE" in the last 168 hours. No results for input(s): "AMMONIA" in the last 168 hours. Coagulation Profile: No results for input(s): "INR", "PROTIME" in the last 168 hours. Cardiac Enzymes: No results for input(s): "CKTOTAL", "CKMB",  "CKMBINDEX", "TROPONINI" in the last 168 hours. BNP (last 3 results) Recent Labs    04/04/22 1025  PROBNP 13.0   HbA1C: No results for input(s): "HGBA1C" in the last 72 hours. CBG: Recent Labs  Lab 09/20/22 2144 09/21/22 0838 09/21/22 1153 09/21/22 1533 09/21/22 2035  GLUCAP 131* 209* 94 192* 140*   Lipid Profile: No results for input(s): "CHOL", "HDL", "LDLCALC", "TRIG", "CHOLHDL", "LDLDIRECT" in the last 72 hours. Thyroid Function Tests: No results for input(s): "TSH", "T4TOTAL", "FREET4", "T3FREE", "THYROIDAB" in the last 72 hours. Anemia Panel: No results for input(s): "VITAMINB12", "FOLATE", "FERRITIN", "TIBC", "IRON", "RETICCTPCT" in the last 72 hours. Sepsis Labs: No results for input(s): "PROCALCITON", "LATICACIDVEN" in the last 168 hours.  Recent Results (from the past 240 hour(s))  Remove and replace urinary cath (placed > 5 days) then obtain urine culture from new indwelling urinary catheter.     Status: None   Collection Time: 09/20/22 12:25 PM   Specimen: Urine, Random  Result Value Ref Range Status   Specimen Description   Final    URINE, RANDOM Performed at [facility], [address] [place], [place] [address]    Special Requests   Final    NONE Performed at [facility], [address], [place], [place] [address]    Culture   Final    NO GROWTH Performed at [facility], [place] [address], [place],  [address]    Report Status 09/21/2022 FINAL  Final          Radiology Studies: No results found.      Scheduled Meds:  amLODipine  2.5 mg Oral Daily   arformoterol  15 mcg Nebulization BID   And   umeclidinium bromide  1 puff Inhalation Daily   buPROPion  300 mg Oral Q breakfast   busPIRone  30 mg Oral QHS   busPIRone  45 mg Oral q AM   Chlorhexidine Gluconate Cloth  6 each Topical Daily   conjugated estrogens  1 Applicatorful Vaginal Daily   enoxaparin (LOVENOX) injection  40 mg Subcutaneous Q24H    feeding supplement  237 mL Oral BID BM   folic acid  1 mg Oral Daily   insulin aspart  0-15 Units Subcutaneous TID WC   insulin aspart  0-5 Units Subcutaneous QHS   memantine  5 mg Oral BID   multivitamin with minerals  1 tablet Oral Daily   rosuvastatin  20 mg Oral QPM   tamsulosin  0.4 mg Oral Daily   Continuous Infusions:     LOS: 3 days    Time spent: 25 mins     Wyvonnia Dusky, MD [facility] Pager [phone]  If 7PM-7AM, please contact night-coverage [email or web address] 09/22/2022, 8:06 AM

## 2022-09-22 NOTE — TOC Progression Note (Addendum)
Transition of Care [facility]) - Progression Note    Patient Details  Name: Amanda Porter MRN: [id number] Date of Birth: Aug 17, 1946  Transition of Care [facility]) CM/SW [place], RN Phone Number: 09/22/2022, 1:31 PM  Clinical Narrative:    Spoke with the patients's daughter She asked if I would check with [facility] to see if they would offer a bed, I sent electronically and called to speak to [place] at [facility], unable to reach [place], spoke with someone at the main desk and was told that they do not have VM WILL try again Spoke with [facility] and they do not have a bed at this time       Expected Discharge Plan and Services           Expected Discharge Date: 09/21/22                                     Social Determinants of Health (SDOH) Interventions    Readmission Risk Interventions     No data to display

## 2022-09-22 NOTE — TOC Progression Note (Signed)
Transition of Care [facility]) - Progression Note    Patient Details  Name: Amanda Porter MRN: [id number] Date of Birth: 10-14-1946  Transition of Care [facility]) CM/SW [place], RN Phone Number: 09/22/2022, 2:03 PM  Clinical Narrative:     SPOKE with Lorenza Chick at [facility], they do not have a bed at this time, I notified [name] the patient's daughter, I did review the other options with her as well, Awaiting a call back from he daughter [name]       Expected Discharge Plan and Services           Expected Discharge Date: 09/21/22                                     Social Determinants of Health (SDOH) Interventions    Readmission Risk Interventions     No data to display

## 2022-09-23 DIAGNOSIS — R569 Unspecified convulsions: Secondary | ICD-10-CM | POA: Diagnosis not present

## 2022-09-23 DIAGNOSIS — F03B18 Unspecified dementia, moderate, with other behavioral disturbance: Secondary | ICD-10-CM | POA: Diagnosis not present

## 2022-09-23 DIAGNOSIS — R918 Other nonspecific abnormal finding of lung field: Secondary | ICD-10-CM | POA: Diagnosis not present

## 2022-09-23 DIAGNOSIS — I10 Essential (primary) hypertension: Secondary | ICD-10-CM | POA: Diagnosis not present

## 2022-09-23 DIAGNOSIS — Z87440 Personal history of urinary (tract) infections: Secondary | ICD-10-CM | POA: Diagnosis not present

## 2022-09-23 DIAGNOSIS — R32 Unspecified urinary incontinence: Secondary | ICD-10-CM | POA: Diagnosis not present

## 2022-09-23 DIAGNOSIS — F0390 Unspecified dementia without behavioral disturbance: Secondary | ICD-10-CM | POA: Diagnosis not present

## 2022-09-23 DIAGNOSIS — D509 Iron deficiency anemia, unspecified: Secondary | ICD-10-CM | POA: Diagnosis not present

## 2022-09-23 DIAGNOSIS — R278 Other lack of coordination: Secondary | ICD-10-CM | POA: Diagnosis not present

## 2022-09-23 DIAGNOSIS — G912 (Idiopathic) normal pressure hydrocephalus: Secondary | ICD-10-CM | POA: Diagnosis not present

## 2022-09-23 DIAGNOSIS — E785 Hyperlipidemia, unspecified: Secondary | ICD-10-CM | POA: Diagnosis not present

## 2022-09-23 DIAGNOSIS — S72001D Fracture of unspecified part of neck of right femur, subsequent encounter for closed fracture with routine healing: Secondary | ICD-10-CM | POA: Diagnosis not present

## 2022-09-23 DIAGNOSIS — S72001A Fracture of unspecified part of neck of right femur, initial encounter for closed fracture: Secondary | ICD-10-CM | POA: Diagnosis not present

## 2022-09-23 DIAGNOSIS — E1165 Type 2 diabetes mellitus with hyperglycemia: Secondary | ICD-10-CM | POA: Diagnosis not present

## 2022-09-23 DIAGNOSIS — N390 Urinary tract infection, site not specified: Secondary | ICD-10-CM | POA: Diagnosis not present

## 2022-09-23 DIAGNOSIS — J440 Chronic obstructive pulmonary disease with acute lower respiratory infection: Secondary | ICD-10-CM | POA: Diagnosis not present

## 2022-09-23 DIAGNOSIS — R4182 Altered mental status, unspecified: Secondary | ICD-10-CM | POA: Diagnosis not present

## 2022-09-23 DIAGNOSIS — Z96641 Presence of right artificial hip joint: Secondary | ICD-10-CM | POA: Diagnosis not present

## 2022-09-23 DIAGNOSIS — R296 Repeated falls: Secondary | ICD-10-CM | POA: Diagnosis not present

## 2022-09-23 DIAGNOSIS — R2689 Other abnormalities of gait and mobility: Secondary | ICD-10-CM | POA: Diagnosis not present

## 2022-09-23 DIAGNOSIS — S7291XD Unspecified fracture of right femur, subsequent encounter for closed fracture with routine healing: Secondary | ICD-10-CM | POA: Diagnosis not present

## 2022-09-23 DIAGNOSIS — J449 Chronic obstructive pulmonary disease, unspecified: Secondary | ICD-10-CM | POA: Diagnosis not present

## 2022-09-23 DIAGNOSIS — M6281 Muscle weakness (generalized): Secondary | ICD-10-CM | POA: Diagnosis not present

## 2022-09-23 DIAGNOSIS — E118 Type 2 diabetes mellitus with unspecified complications: Secondary | ICD-10-CM | POA: Diagnosis not present

## 2022-09-23 DIAGNOSIS — G4089 Other seizures: Secondary | ICD-10-CM | POA: Diagnosis not present

## 2022-09-23 DIAGNOSIS — Z7401 Bed confinement status: Secondary | ICD-10-CM | POA: Diagnosis not present

## 2022-09-23 DIAGNOSIS — E876 Hypokalemia: Secondary | ICD-10-CM | POA: Diagnosis not present

## 2022-09-23 DIAGNOSIS — R339 Retention of urine, unspecified: Secondary | ICD-10-CM | POA: Diagnosis not present

## 2022-09-23 DIAGNOSIS — R41841 Cognitive communication deficit: Secondary | ICD-10-CM | POA: Diagnosis not present

## 2022-09-23 DIAGNOSIS — R2681 Unsteadiness on feet: Secondary | ICD-10-CM | POA: Diagnosis not present

## 2022-09-23 DIAGNOSIS — J984 Other disorders of lung: Secondary | ICD-10-CM | POA: Diagnosis not present

## 2022-09-23 DIAGNOSIS — F331 Major depressive disorder, recurrent, moderate: Secondary | ICD-10-CM | POA: Diagnosis not present

## 2022-09-23 DIAGNOSIS — F4321 Adjustment disorder with depressed mood: Secondary | ICD-10-CM | POA: Diagnosis not present

## 2022-09-23 DIAGNOSIS — F445 Conversion disorder with seizures or convulsions: Secondary | ICD-10-CM | POA: Diagnosis not present

## 2022-09-23 DIAGNOSIS — F411 Generalized anxiety disorder: Secondary | ICD-10-CM | POA: Diagnosis not present

## 2022-09-23 LAB — BASIC METABOLIC PANEL
Anion gap: 6 (ref 5–15)
BUN: 13 mg/dL (ref 8–23)
CO2: 31 mmol/L (ref 22–32)
Calcium: 8.8 mg/dL — ABNORMAL LOW (ref 8.9–10.3)
Chloride: 103 mmol/L (ref 98–111)
Creatinine, Ser: 0.58 mg/dL (ref 0.44–1.00)
GFR, Estimated: >60 mL/min (ref >60)
Glucose, Bld: 143 mg/dL — ABNORMAL HIGH (ref 70–99)
Potassium: 3.5 mmol/L (ref 3.5–5.1)
Sodium: 140 mmol/L (ref 135–145)

## 2022-09-23 LAB — CBC
HCT: 25 % — ABNORMAL LOW (ref 36.0–46.0)
Hemoglobin: 7.5 g/dL — ABNORMAL LOW (ref 12.0–15.0)
MCH: 27.2 pg (ref 26.0–34.0)
MCHC: 30 g/dL (ref 30.0–36.0)
MCV: 90.6 fL (ref 80.0–100.0)
Platelets: 528 10*3/uL — ABNORMAL HIGH (ref 150–400)
RBC: 2.76 MIL/uL — ABNORMAL LOW (ref 3.87–5.11)
RDW: 15 % (ref 11.5–15.5)
WBC: 8.3 10*3/uL (ref 4.0–10.5)
nRBC: 0 % (ref 0.0–0.2)

## 2022-09-23 LAB — GLUCOSE, CAPILLARY
Glucose-Capillary: 148 mg/dL — ABNORMAL HIGH (ref 70–99)
Glucose-Capillary: 144 mg/dL — ABNORMAL HIGH (ref 70–99)

## 2022-09-23 MED ORDER — HYDROCODONE-ACETAMINOPHEN 5-325 MG PO TABS: 1.0000 | ORAL_TABLET | ORAL | 0 refills | Status: DC | PRN

## 2022-09-23 MED ORDER — HYDROCODONE-ACETAMINOPHEN 5-325 MG PO TABS: 1.0000 | ORAL_TABLET | ORAL | 0 refills | Status: AC | PRN

## 2022-09-23 NOTE — Discharge Instructions (Signed)
  Your surgeon has performed an operation on your shunt.      Activity    No bending, lifting, or twisting ("BLT"). Avoid lifting objects heavier than 10 pounds (gallon milk jug).  Where possible, avoid household activities that involve lifting, bending, reaching, pushing, or pulling such as laundry, vacuuming, grocery shopping, and childcare.   Increase physical activity slowly as tolerated.  Taking short walks is encouraged, but avoid strenuous exercise. Do not jog, run, bicycle, lift weights, or participate in any other exercises unless specifically allowed by your doctor.  Talk to your doctor before resuming sexual activity.  You should not drive until cleared by your doctor.  Until released by your doctor, you should not return to work or school.  You should rest at home and let your body heal.   You may shower seven days after your surgery.  After showering, lightly dab your incision dry. Do not take a tub bath or go swimming until approved by your doctor at your follow-up appointment.   Surgical Incision   We will remove your staples at your follow up appointment.  Diet           You may return to your usual diet. However, you may experience discomfort when swallowing in the first month after your surgery. This is normal. You may find that softer foods are more comfortable for you to swallow. Be sure to stay hydrated.  When to Contact us  You may experience pain in your neck and/or pain between your shoulder blades. This is normal and should improve in the next few weeks with the help of pain medication, muscle relaxers, and rest. Some patients report that a warm compress on the back of the neck or between the shoulder blades helps.  However, should you experience any of the following, contact us immediately: New numbness or weakness Pain that is progressively getting worse, and is not relieved by your pain medication, muscle relaxers, rest, and warm compresses Bleeding,  redness, swelling, pain, or drainage from surgical incision Chills or flu-like symptoms Fever greater than 101.0 F (38.3 C) Inability to eat, drink fluids, or take medications Problems with bowel or bladder functions Difficulty breathing or shortness of breath Warmth, tenderness, or swelling in your calf Contact Information During office hours (Monday-Friday 9 am to 5 pm), please call your physician at [phone] and ask for Berd[facility] After hours and weekends, please call [phone] and speak with the neurosurgeon on call For a life-threatening emergency, call 911

## 2022-09-23 NOTE — TOC Progression Note (Signed)
Transition of Care [facility]) - Progression Note    Patient Details  Name: Amanda Porter MRN: [id number] Date of Birth: March 27, 1946  Transition of Care [facility]) CM/SW [place], RN Phone Number: 09/23/2022, 11:08 AM  Clinical Narrative:      Called daughter [name] and notified her of the DC and that the patient will go to room 1006 EMS will transport       Expected Discharge Plan and Services           Expected Discharge Date: 09/23/22                                     Social Determinants of Health (SDOH) Interventions    Readmission Risk Interventions     No data to display

## 2022-09-23 NOTE — Progress Notes (Signed)
    Attending Progress Note  History: Amanda Porter is here for hydrocephalus and shunt malfunction.  POD4: Having some mild abdominal discomfort but otherwise well. POD3: Doing well. POD2: No issues overnight POD1: Doing well.  No complaints.  Physical Exam: Vitals:   09/22/22 1800 09/22/22 2111  BP: (!) 122/47 (!) 134/57  Pulse: 94 93  Resp: 18 20  Temp: 98.9 F (37.2 C) 99.2 F (37.3 C)  SpO2: 100% 100%    AA Ox self, hospital  CNI No drift  Strength:5/5 throughout BUE  Incisions c/d/I  Data:  Other tests/results: n/a  Assessment/Plan:  Naphtali A Dagher is stable after shunt removal and replacement for normal pressure hydrocephalus.  - mobilize - pain control - DVT prophylaxis - PTOT - Appreciate hospitalist involvement   Meade Maw MD, [facility] Department of Neurosurgery

## 2022-09-23 NOTE — TOC Progression Note (Signed)
Transition of Care [facility]) - Progression Note    Patient Details  Name: Amanda Porter MRN: [id number] Date of Birth: Jun 17, 1946  Transition of Care [facility]) CM/SW [place], RN Phone Number: 09/23/2022, 9:27 AM  Clinical Narrative:     Ins is approved to go to [place] room 1006 today       Expected Discharge Plan and Services           Expected Discharge Date: 09/21/22                                     Social Determinants of Health (SDOH) Interventions    Readmission Risk Interventions     No data to display

## 2022-09-23 NOTE — Plan of Care (Signed)
  Problem: Coping: Goal: Ability to adjust to condition or change in health will improve Outcome: Progressing   Problem: Metabolic: Goal: Ability to maintain appropriate glucose levels will improve Outcome: Progressing   Problem: Health Behavior/Discharge Planning: Goal: Ability to manage health-related needs will improve Outcome: Progressing   Problem: Clinical Measurements: Goal: Cardiovascular complication will be avoided Outcome: Progressing   Problem: Activity: Goal: Risk for activity intolerance will decrease Outcome: Progressing   Problem: Nutrition: Goal: Adequate nutrition will be maintained Outcome: Progressing   Problem: Coping: Goal: Level of anxiety will decrease Outcome: Progressing   Problem: Elimination: Goal: Will not experience complications related to urinary retention Outcome: Progressing   Problem: Pain Managment: Goal: General experience of comfort will improve Outcome: Progressing   Problem: Safety: Goal: Ability to remain free from injury will improve Outcome: Progressing

## 2022-09-23 NOTE — Plan of Care (Signed)

## 2022-09-23 NOTE — Discharge Summary (Signed)
Physician Discharge Summary  Patient ID: Amanda Porter MRN: [id number] DOB/AGE: [birth year] 77 y.o.  Admit date: 09/19/2022 Discharge date: 09/23/2022  Admission Diagnoses:Principal Problem:   NPH (normal pressure hydrocephalus) ([place])  Discharge Diagnoses:  Principal Problem:   NPH (normal pressure hydrocephalus) (HCC) Active Problems:   Hyperlipidemia   COPD mixed type (HCC)   Dementia without behavioral disturbance (HCC)   Normal pressure hydrocephalus (HCC)   Essential hypertension   Anxiety and depression   Memory disturbance   Tobacco use disorder   Type 2 diabetes mellitus with hyperglycemia (HCC)   Protein-calorie malnutrition, severe   S/P VP shunt   DNR (do not resuscitate)   Shunt malfunction   Discharged Condition: good  Hospital Course: Amanda Porter was admitted to the hospital with diagnosis of a shunt malfunction.  She been having multiple falls.  She underwent revision of her shunt, then was admitted for observation as well as placement.  She was felt to be stable for discharge on postoperative day 4.  Consults:  hospitalist  Significant Diagnostic Studies: none  Treatments: surgery: VPS revision  Discharge Exam: Blood pressure (!) 118/56, pulse 99, temperature 98.4 F (36.9 C), resp. rate 17, height 5\' 2"  (1.575 m), weight 47.7 kg, SpO2 100 %. General appearance: alert  Doing well CNI MAEW   Disposition: Discharge disposition: 03-Skilled [place]       Discharge Instructions     Discharge patient   Complete by: As directed    Discharge disposition: 03-Skilled [place]   Discharge patient date: 09/23/2022      Allergies as of 09/23/2022       Reactions   Lexapro [escitalopram Oxalate] Other (See Comments)   Dizziness, nausea, fatigue   Neosporin  [neomycin-polymyxin-gramicidin] Rash   (Neosporin)   Neosporin [bacitracin-polymyxin B] Rash        Medication List     TAKE these medications    Accu-Chek Softclix  Lancets lancets Use as instructed   Albuterol Sulfate 2.5 MG/0.5ML Nebu Inhale 2.5 mg into the lungs every 6 (six) hours as needed. What changed: Another medication with the same name was removed. Continue taking this medication, and follow the directions you see here.   albuterol 108 (90 Base) MCG/ACT inhaler Commonly known as: VENTOLIN HFA INHALE 2 PUFFS BY MOUTH EVERY 4 HOURS AS NEEDED What changed: Another medication with the same name was removed. Continue taking this medication, and follow the directions you see here.   amLODipine 2.5 MG tablet Commonly known as: NORVASC Take 1 tablet (2.5 mg total) by mouth daily. for blood pressure. What changed: when to take this   aspirin 81 MG chewable tablet Chew 1 tablet (81 mg total) by mouth daily.   buPROPion 300 MG 24 hr tablet Commonly known as: WELLBUTRIN XL Take 1 tablet (300 mg total) by mouth daily with breakfast.   busPIRone 15 MG tablet Commonly known as: BUSPAR Take 45 mg by mouth in the morning.   busPIRone 30 MG tablet Commonly known as: BUSPAR Take 1 tablet (30 mg total) by mouth at bedtime.   clonazePAM 0.5 MG tablet Commonly known as: KLONOPIN Take 1 tablet (0.5 mg total) by mouth at bedtime.   enoxaparin 40 MG/0.4ML injection Commonly known as: LOVENOX Inject 0.4 mLs (40 mg total) into the skin daily for 13 days.   folic acid 1 MG tablet Commonly known as: FOLVITE Take 1 tablet (1 mg total) by mouth daily.   glipiZIDE 2.5 MG 24 hr tablet Commonly known as:  GLUCOTROL XL TAKE 1 TABLET (2.5 MG TOTAL) BY MOUTH DAILY WITH BREAKFAST. FOR DIABETES.   glucose blood test strip Use as instructed   HYDROcodone-acetaminophen 5-325 MG tablet Commonly known as: NORCO/VICODIN Take 1 tablet by mouth every 4 (four) hours as needed for moderate pain (pain score 4-6).   insulin aspart 100 UNIT/ML injection Commonly known as: novoLOG Inject 0-9 Units into the skin 3 (three) times daily with meals. Sliding scale  insulin Less than 70 initiate hypoglycemia protocol 70-120  0 units 120-150 1 unit 151-200 2 units 201-250 3 units 251-300 5 units 301-350 7 units 351-400 9 units Greater than 400 call MD   memantine 5 MG tablet Commonly known as: NAMENDA Take 1 tablet (5 mg total) by mouth 2 (two) times daily. For memory.   Premarin vaginal cream Generic drug: conjugated estrogens Apply one pea-sized amount around the opening of the urethra daily for 2 weeks, then 3 times weekly moving forward.   rosuvastatin 20 MG tablet Commonly known as: CRESTOR TAKE 1 TABLET (20 MG TOTAL) BY MOUTH EVERY EVENING. FOR CHOLESTEROL.   Stiolto Respimat 2.5-2.5 MCG/ACT Aers Generic drug: Tiotropium Bromide-Olodaterol INHALE 2 PUFFS BY MOUTH INTO THE LUNGS DAILY What changed: See the new instructions.   tamsulosin 0.4 MG Caps capsule Commonly known as: FLOMAX Take 1 capsule (0.4 mg total) by mouth daily. For urine retention        Contact information for follow-up providers     Geronimo Boot, PA-C Follow up on 10/02/2022.   Specialty: Neurosurgery Why: 0930 Contact information: [place]  [address] [phone]              Contact information for after-discharge care     Destination     [facility] Preferred SNF .   Service: Skilled Nursing Contact information: [address] [place] [phone]                     Signed: Meade Maw 09/23/2022, 10:55 AM

## 2022-09-23 NOTE — Progress Notes (Signed)
PROGRESS NOTE   Consult HPI was taken from Dr. Tobie Poet: Amanda Porter is a 76 year old female with medical diagnosis of normal pressure hydrocephalus, status post VP shunt placement and shunt malfunction, COPD, chronic respiratory failure on 3 L nasal cannula baseline, history of CVA, depression, dementia, non-insulin-dependent diabetes mellitus, dysphagia, dyslipidemia presented for scheduled surgical procedure with Dr. Cari Caraway and Dr. Perrin Maltese for ventriculoperitoneal shunt revision.   Hospitalist has been consulted for chronic medical management.   Initial vitals showed temperature of 98.7, respiration rate of 16, heart rate of 95, blood pressure 129/52, SPO2 of 100% on room air.      Amanda Porter  IWL:[id number] DOB: [birth year] DOA: 09/19/2022 PCP: Pleas Koch, NP   Assessment & Plan:   Principal Problem:   NPH (normal pressure hydrocephalus) (HCC) Active Problems:   Dementia without behavioral disturbance (HCC)   Normal pressure hydrocephalus (HCC)   S/P VP shunt   Type 2 diabetes mellitus with hyperglycemia (HCC)   COPD mixed type (HCC)   Anxiety and depression   Hyperlipidemia   Essential hypertension   Memory disturbance   Tobacco use disorder   Protein-calorie malnutrition, severe   DNR (do not resuscitate)   Shunt malfunction  Assessment and Plan: NPH: s/p ventriculoperitoneal shunt revision on 09/19/2022 with Dr. Cari Caraway and Dr. Dahlia Byes. Management per neuro surg & gen surg    DM2: HbA1c 7.2 in 05/2022, fair control. Continue on SSI w/ accuchecks   COPD:  w/o exacerbation. Continue on chronic supplemental oxygen, 3L [place]   Depression: unknown severity. Continue on home dose of buspirone, bupropion   Recent right hip arthroplasty: on 09/07/22. Norco, morphine prn for pain    Tobacco use disorder: quit smoking months ago    Dementia: continue on home dose of memantine    HTN: continue on home dose of amlodipine. IV hydralazine prn    HLD: continue on  statin   Thrombocytosis: etiology unclear, labile  Leukocytosis: WNL   Normocytic anemia: H&H are trending up today. Will transfuse if Hb < 7.0    DVT prophylaxis: lovenox Code Status: full  Family Communication:  Disposition Plan:d/c to SNF today   Level of care: Med-Surg  Status is: Inpatient Remains inpatient appropriate because: d/c to SNF today     Consultants:  Hospitalist   Procedures:   Antimicrobials:    Subjective: Pt c/o mild hip pain   Objective: Vitals:   09/22/22 0547 09/22/22 0852 09/22/22 1800 09/22/22 2111  BP: (!) 106/59 136/60 (!) 122/47 (!) 134/57  Pulse: 96 96 94 93  Resp: 16 16 18 20   Temp: (!) 97.5 F (36.4 C) 98.8 F (37.1 C) 98.9 F (37.2 C) 99.2 F (37.3 C)  TempSrc:   Oral   SpO2: 100% 100% 100% 100%  Weight:      Height:        Intake/Output Summary (Last 24 hours) at 09/23/2022 0826 Last data filed at 09/23/2022 0500 Gross per 24 hour  Intake 0 ml  Output 1500 ml  Net -1500 ml   Filed Weights   09/19/22 0910  Weight: 47.7 kg    Examination:  General exam: Appears comfortable. Frail appearing   Respiratory system: clear breath sounds b/l Cardiovascular system: S1/S2+. No rubs or clicks  Gastrointestinal system: Abd is soft, NT, ND & normal bowel sounds  Central nervous system: alert and oriented to person only. Moves all extremities  Psychiatry: Judgement and insight appears at baseline. Appropriate mood and affect.    Data Reviewed:  I have personally reviewed following labs and imaging studies  CBC: Recent Labs  Lab 09/19/22 1741 09/20/22 0807 09/21/22 0457 09/22/22 0325 09/23/22 0541  WBC 14.3* 14.9* 10.7* 9.2 8.3  NEUTROABS 13.5*  --   --   --   --   HGB 7.9* 7.5* 7.2* 7.1* 7.5*  HCT 26.0* 24.9* 23.7* 23.6* 25.0*  MCV 90.9 90.5 91.9 90.1 90.6  PLT 503* 532* 533* 503* 638*   Basic Metabolic Panel: Recent Labs  Lab 09/19/22 1741 09/20/22 0807 09/21/22 0457 09/22/22 0325 09/23/22 0541  NA 134*  140 139 142 140  K 3.8 4.4 3.7 3.8 3.5  CL 100 106 103 104 103  CO2 27 30 31 30 31   GLUCOSE 192* 154* 111* 115* 143*  BUN 17 13 10 8 13   CREATININE 0.48 0.62 0.50 0.53 0.58  CALCIUM 8.2* 8.7* 8.0* 8.8* 8.8*   GFR: Estimated Creatinine Clearance (by C-G formula based on SCr of 0.58 mg/dL) Female: 44.3 mL/min Female: 52.2 mL/min Liver Function Tests: No results for input(s): "AST", "ALT", "ALKPHOS", "BILITOT", "PROT", "ALBUMIN" in the last 168 hours. No results for input(s): "LIPASE", "AMYLASE" in the last 168 hours. No results for input(s): "AMMONIA" in the last 168 hours. Coagulation Profile: No results for input(s): "INR", "PROTIME" in the last 168 hours. Cardiac Enzymes: No results for input(s): "CKTOTAL", "CKMB", "CKMBINDEX", "TROPONINI" in the last 168 hours. BNP (last 3 results) Recent Labs    04/04/22 1025  PROBNP 13.0   HbA1C: No results for input(s): "HGBA1C" in the last 72 hours. CBG: Recent Labs  Lab 09/22/22 0821 09/22/22 1313 09/22/22 1822 09/22/22 2055 09/23/22 0742  GLUCAP 142* 162* 136* 163* 148*   Lipid Profile: No results for input(s): "CHOL", "HDL", "LDLCALC", "TRIG", "CHOLHDL", "LDLDIRECT" in the last 72 hours. Thyroid Function Tests: No results for input(s): "TSH", "T4TOTAL", "FREET4", "T3FREE", "THYROIDAB" in the last 72 hours. Anemia Panel: No results for input(s): "VITAMINB12", "FOLATE", "FERRITIN", "TIBC", "IRON", "RETICCTPCT" in the last 72 hours. Sepsis Labs: No results for input(s): "PROCALCITON", "LATICACIDVEN" in the last 168 hours.  Recent Results (from the past 240 hour(s))  Remove and replace urinary cath (placed > 5 days) then obtain urine culture from new indwelling urinary catheter.     Status: None   Collection Time: 09/20/22 12:25 PM   Specimen: Urine, Random  Result Value Ref Range Status   Specimen Description   Final    URINE, RANDOM Performed at [facility], [address] [place], [place] [address]    Special  Requests   Final    NONE Performed at [facility], [address] [place], [place] [address]    Culture   Final    NO GROWTH Performed at [facility], [place] [address], [place], [place] [address]    Report Status 09/21/2022 FINAL  Final          Radiology Studies: No results found.      Scheduled Meds:  amLODipine  2.5 mg Oral Daily   arformoterol  15 mcg Nebulization BID   And   umeclidinium bromide  1 puff Inhalation Daily   buPROPion  300 mg Oral Q breakfast   busPIRone  30 mg Oral QHS   busPIRone  45 mg Oral q AM   Chlorhexidine Gluconate Cloth  6 each Topical Daily   conjugated estrogens  1 Applicatorful Vaginal Daily   enoxaparin (LOVENOX) injection  40 mg Subcutaneous Q24H   feeding supplement  237 mL Oral BID  BM   folic acid  1 mg Oral Daily   insulin aspart  0-15 Units Subcutaneous TID WC   insulin aspart  0-5 Units Subcutaneous QHS   memantine  5 mg Oral BID   multivitamin with minerals  1 tablet Oral Daily   rosuvastatin  20 mg Oral QPM   tamsulosin  0.4 mg Oral Daily   Continuous Infusions:     LOS: 4 days    Time spent: 25 mins     Wyvonnia Dusky, MD [facility] Pager [phone]  If 7PM-7AM, please contact night-coverage [email or web address] 09/23/2022, 8:26 AM

## 2022-09-23 NOTE — Progress Notes (Signed)
Attempted to call report to [phone] as per Deliliah, case manager. Receptionist at facility transferred call to unit 3 times with no answer. Phone number left for facility to call for report. Will continue to monitor.

## 2022-09-23 NOTE — Plan of Care (Signed)
Problem: Education: Goal: Ability to describe self-care measures that may prevent or decrease complications (Diabetes Survival Skills Education) will improve 09/23/2022 1118 by Alferd Apa, RN Outcome: Adequate for Discharge 09/23/2022 [phone] by Alferd Apa, RN Outcome: Progressing Goal: Individualized Educational Video(s) 09/23/2022 1118 by Alferd Apa, RN Outcome: Adequate for Discharge 09/23/2022 0819 by Alferd Apa, RN Outcome: Progressing   Problem: Coping: Goal: Ability to adjust to condition or change in health will improve 09/23/2022 1118 by Alferd Apa, RN Outcome: Adequate for Discharge 09/23/2022 0819 by Alferd Apa, RN Outcome: Progressing   Problem: Fluid Volume: Goal: Ability to maintain a balanced intake and output will improve 09/23/2022 1118 by Alferd Apa, RN Outcome: Adequate for Discharge 09/23/2022 0819 by Alferd Apa, RN Outcome: Progressing   Problem: Health Behavior/Discharge Planning: Goal: Ability to identify and utilize available resources and services will improve 09/23/2022 1118 by Alferd Apa, RN Outcome: Adequate for Discharge 09/23/2022 0819 by Alferd Apa, RN Outcome: Progressing Goal: Ability to manage health-related needs will improve 09/23/2022 1118 by Alferd Apa, RN Outcome: Adequate for Discharge 09/23/2022 0819 by Alferd Apa, RN Outcome: Progressing   Problem: Metabolic: Goal: Ability to maintain appropriate glucose levels will improve 09/23/2022 1118 by Alferd Apa, RN Outcome: Adequate for Discharge 09/23/2022 0819 by Alferd Apa, RN Outcome: Progressing   Problem: Nutritional: Goal: Maintenance of adequate nutrition will improve 09/23/2022 1118 by Alferd Apa, RN Outcome: Adequate for Discharge 09/23/2022 [phone] by Alferd Apa, RN Outcome: Progressing Goal: Progress toward achieving an optimal weight will improve 09/23/2022 1118 by Alferd Apa, RN Outcome: Adequate for  Discharge 09/23/2022 [phone] by Alferd Apa, RN Outcome: Progressing   Problem: Skin Integrity: Goal: Risk for impaired skin integrity will decrease 09/23/2022 1118 by Alferd Apa, RN Outcome: Adequate for Discharge 09/23/2022 0819 by Alferd Apa, RN Outcome: Progressing   Problem: Tissue Perfusion: Goal: Adequacy of tissue perfusion will improve 09/23/2022 1118 by Alferd Apa, RN Outcome: Adequate for Discharge 09/23/2022 0819 by Alferd Apa, RN Outcome: Progressing   Problem: Education: Goal: Knowledge of General Education information will improve Description: Including pain rating scale, medication(s)/side effects and non-pharmacologic comfort measures 09/23/2022 1118 by Alferd Apa, RN Outcome: Adequate for Discharge 09/23/2022 0819 by Alferd Apa, RN Outcome: Progressing   Problem: Health Behavior/Discharge Planning: Goal: Ability to manage health-related needs will improve 09/23/2022 1118 by Alferd Apa, RN Outcome: Adequate for Discharge 09/23/2022 0819 by Alferd Apa, RN Outcome: Progressing   Problem: Clinical Measurements: Goal: Ability to maintain clinical measurements within normal limits will improve 09/23/2022 1118 by Alferd Apa, RN Outcome: Adequate for Discharge 09/23/2022 [phone] by Alferd Apa, RN Outcome: Progressing Goal: Will remain free from infection 09/23/2022 1118 by Alferd Apa, RN Outcome: Adequate for Discharge 09/23/2022 [phone] by Alferd Apa, RN Outcome: Progressing Goal: Diagnostic test results will improve 09/23/2022 1118 by Alferd Apa, RN Outcome: Adequate for Discharge 09/23/[phone] by Alferd Apa, RN Outcome: Progressing Goal: Respiratory complications will improve 09/23/2022 1118 by Alferd Apa, RN Outcome: Adequate for Discharge 09/23/2022 [phone] by Alferd Apa, RN Outcome: Progressing Goal: Cardiovascular complication will be avoided 09/23/2022 1118 by Alferd Apa, RN Outcome: Adequate for  Discharge 09/23/2022 [phone] by Alferd Apa, RN Outcome: Progressing   Problem: Activity: Goal: Risk for activity intolerance will decrease 09/23/2022 1118 by Neysha Criado, Alfredo Batty, RN Outcome: Adequate for Discharge  09/23/2022 0819 by Alferd Apa, RN Outcome: Progressing   Problem: Nutrition: Goal: Adequate nutrition will be maintained 09/23/2022 1118 by Alferd Apa, RN Outcome: Adequate for Discharge 09/23/2022 [phone] by Alferd Apa, RN Outcome: Progressing   Problem: Coping: Goal: Level of anxiety will decrease 09/23/2022 1118 by Alferd Apa, RN Outcome: Adequate for Discharge 09/23/2022 0819 by Alferd Apa, RN Outcome: Progressing   Problem: Elimination: Goal: Will not experience complications related to bowel motility 09/23/2022 1118 by Alferd Apa, RN Outcome: Adequate for Discharge 09/23/2022 0819 by Alferd Apa, RN Outcome: Progressing Goal: Will not experience complications related to urinary retention 09/23/2022 1118 by Alferd Apa, RN Outcome: Adequate for Discharge 09/23/2022 0819 by Alferd Apa, RN Outcome: Progressing   Problem: Pain Managment: Goal: General experience of comfort will improve 09/23/2022 1118 by Alferd Apa, RN Outcome: Adequate for Discharge 09/23/2022 0819 by Alferd Apa, RN Outcome: Progressing   Problem: Safety: Goal: Ability to remain free from injury will improve 09/23/2022 1118 by Alferd Apa, RN Outcome: Adequate for Discharge 09/23/2022 0819 by Alferd Apa, RN Outcome: Progressing   Problem: Skin Integrity: Goal: Risk for impaired skin integrity will decrease 09/23/2022 1118 by Alferd Apa, RN Outcome: Adequate for Discharge 09/23/2022 0819 by Alferd Apa, RN Outcome: Progressing

## 2022-09-24 ENCOUNTER — Telehealth: Payer: Self-pay | Admitting: Primary Care

## 2022-09-24 DIAGNOSIS — E876 Hypokalemia: Secondary | ICD-10-CM | POA: Diagnosis not present

## 2022-09-24 DIAGNOSIS — R296 Repeated falls: Secondary | ICD-10-CM | POA: Diagnosis not present

## 2022-09-24 DIAGNOSIS — G912 (Idiopathic) normal pressure hydrocephalus: Secondary | ICD-10-CM | POA: Diagnosis not present

## 2022-09-24 DIAGNOSIS — J440 Chronic obstructive pulmonary disease with acute lower respiratory infection: Secondary | ICD-10-CM | POA: Diagnosis not present

## 2022-09-24 DIAGNOSIS — Z96641 Presence of right artificial hip joint: Secondary | ICD-10-CM | POA: Diagnosis not present

## 2022-09-24 DIAGNOSIS — I10 Essential (primary) hypertension: Secondary | ICD-10-CM | POA: Diagnosis not present

## 2022-09-24 DIAGNOSIS — E1165 Type 2 diabetes mellitus with hyperglycemia: Secondary | ICD-10-CM | POA: Diagnosis not present

## 2022-09-24 DIAGNOSIS — F445 Conversion disorder with seizures or convulsions: Secondary | ICD-10-CM | POA: Diagnosis not present

## 2022-09-24 DIAGNOSIS — S72001A Fracture of unspecified part of neck of right femur, initial encounter for closed fracture: Secondary | ICD-10-CM | POA: Diagnosis not present

## 2022-09-24 DIAGNOSIS — M6281 Muscle weakness (generalized): Secondary | ICD-10-CM | POA: Diagnosis not present

## 2022-09-24 NOTE — Telephone Encounter (Signed)
LVM for pt to rtn my call to schedule AWV-I with [facility] call back # [phone] 

## 2022-09-26 DIAGNOSIS — E118 Type 2 diabetes mellitus with unspecified complications: Secondary | ICD-10-CM | POA: Diagnosis not present

## 2022-09-26 DIAGNOSIS — S72001A Fracture of unspecified part of neck of right femur, initial encounter for closed fracture: Secondary | ICD-10-CM | POA: Diagnosis not present

## 2022-09-26 DIAGNOSIS — F0390 Unspecified dementia without behavioral disturbance: Secondary | ICD-10-CM | POA: Diagnosis not present

## 2022-09-26 DIAGNOSIS — S72001D Fracture of unspecified part of neck of right femur, subsequent encounter for closed fracture with routine healing: Secondary | ICD-10-CM | POA: Diagnosis not present

## 2022-09-26 DIAGNOSIS — E785 Hyperlipidemia, unspecified: Secondary | ICD-10-CM | POA: Diagnosis not present

## 2022-09-26 DIAGNOSIS — E876 Hypokalemia: Secondary | ICD-10-CM | POA: Diagnosis not present

## 2022-09-26 DIAGNOSIS — G912 (Idiopathic) normal pressure hydrocephalus: Secondary | ICD-10-CM | POA: Diagnosis not present

## 2022-09-26 DIAGNOSIS — J440 Chronic obstructive pulmonary disease with acute lower respiratory infection: Secondary | ICD-10-CM | POA: Diagnosis not present

## 2022-09-26 DIAGNOSIS — M6281 Muscle weakness (generalized): Secondary | ICD-10-CM | POA: Diagnosis not present

## 2022-09-26 DIAGNOSIS — R339 Retention of urine, unspecified: Secondary | ICD-10-CM | POA: Diagnosis not present

## 2022-09-26 DIAGNOSIS — D509 Iron deficiency anemia, unspecified: Secondary | ICD-10-CM | POA: Diagnosis not present

## 2022-09-26 DIAGNOSIS — R296 Repeated falls: Secondary | ICD-10-CM | POA: Diagnosis not present

## 2022-09-26 DIAGNOSIS — R918 Other nonspecific abnormal finding of lung field: Secondary | ICD-10-CM | POA: Diagnosis not present

## 2022-09-26 DIAGNOSIS — J449 Chronic obstructive pulmonary disease, unspecified: Secondary | ICD-10-CM | POA: Diagnosis not present

## 2022-09-26 DIAGNOSIS — F445 Conversion disorder with seizures or convulsions: Secondary | ICD-10-CM | POA: Diagnosis not present

## 2022-09-26 DIAGNOSIS — I10 Essential (primary) hypertension: Secondary | ICD-10-CM | POA: Diagnosis not present

## 2022-09-26 DIAGNOSIS — E1165 Type 2 diabetes mellitus with hyperglycemia: Secondary | ICD-10-CM | POA: Diagnosis not present

## 2022-09-29 DIAGNOSIS — F445 Conversion disorder with seizures or convulsions: Secondary | ICD-10-CM | POA: Diagnosis not present

## 2022-09-29 DIAGNOSIS — J440 Chronic obstructive pulmonary disease with acute lower respiratory infection: Secondary | ICD-10-CM | POA: Diagnosis not present

## 2022-09-29 DIAGNOSIS — R296 Repeated falls: Secondary | ICD-10-CM | POA: Diagnosis not present

## 2022-09-29 DIAGNOSIS — S72001A Fracture of unspecified part of neck of right femur, initial encounter for closed fracture: Secondary | ICD-10-CM | POA: Diagnosis not present

## 2022-09-29 DIAGNOSIS — E876 Hypokalemia: Secondary | ICD-10-CM | POA: Diagnosis not present

## 2022-09-29 DIAGNOSIS — G912 (Idiopathic) normal pressure hydrocephalus: Secondary | ICD-10-CM | POA: Diagnosis not present

## 2022-09-29 DIAGNOSIS — I10 Essential (primary) hypertension: Secondary | ICD-10-CM | POA: Diagnosis not present

## 2022-09-29 DIAGNOSIS — E1165 Type 2 diabetes mellitus with hyperglycemia: Secondary | ICD-10-CM | POA: Diagnosis not present

## 2022-09-30 ENCOUNTER — Ambulatory Visit: Payer: Medicare PPO | Admitting: Physician Assistant

## 2022-09-30 ENCOUNTER — Encounter: Payer: Self-pay | Admitting: Physician Assistant

## 2022-09-30 VITALS — BP 120/66 | HR 88 | Ht 62.0 in | Wt 103.0 lb

## 2022-09-30 DIAGNOSIS — S72001A Fracture of unspecified part of neck of right femur, initial encounter for closed fracture: Secondary | ICD-10-CM | POA: Diagnosis not present

## 2022-09-30 DIAGNOSIS — Z87440 Personal history of urinary (tract) infections: Secondary | ICD-10-CM | POA: Diagnosis not present

## 2022-09-30 DIAGNOSIS — E1165 Type 2 diabetes mellitus with hyperglycemia: Secondary | ICD-10-CM | POA: Diagnosis not present

## 2022-09-30 DIAGNOSIS — R32 Unspecified urinary incontinence: Secondary | ICD-10-CM

## 2022-09-30 DIAGNOSIS — G912 (Idiopathic) normal pressure hydrocephalus: Secondary | ICD-10-CM | POA: Diagnosis not present

## 2022-09-30 DIAGNOSIS — E876 Hypokalemia: Secondary | ICD-10-CM | POA: Diagnosis not present

## 2022-09-30 DIAGNOSIS — R918 Other nonspecific abnormal finding of lung field: Secondary | ICD-10-CM | POA: Diagnosis not present

## 2022-09-30 DIAGNOSIS — J984 Other disorders of lung: Secondary | ICD-10-CM | POA: Diagnosis not present

## 2022-09-30 DIAGNOSIS — R569 Unspecified convulsions: Secondary | ICD-10-CM | POA: Diagnosis not present

## 2022-09-30 DIAGNOSIS — G4089 Other seizures: Secondary | ICD-10-CM | POA: Diagnosis not present

## 2022-09-30 DIAGNOSIS — F0390 Unspecified dementia without behavioral disturbance: Secondary | ICD-10-CM | POA: Diagnosis not present

## 2022-09-30 DIAGNOSIS — D509 Iron deficiency anemia, unspecified: Secondary | ICD-10-CM | POA: Diagnosis not present

## 2022-09-30 DIAGNOSIS — I10 Essential (primary) hypertension: Secondary | ICD-10-CM | POA: Diagnosis not present

## 2022-09-30 DIAGNOSIS — S72001D Fracture of unspecified part of neck of right femur, subsequent encounter for closed fracture with routine healing: Secondary | ICD-10-CM | POA: Diagnosis not present

## 2022-09-30 DIAGNOSIS — E118 Type 2 diabetes mellitus with unspecified complications: Secondary | ICD-10-CM | POA: Diagnosis not present

## 2022-09-30 DIAGNOSIS — R339 Retention of urine, unspecified: Secondary | ICD-10-CM

## 2022-09-30 DIAGNOSIS — M6281 Muscle weakness (generalized): Secondary | ICD-10-CM | POA: Diagnosis not present

## 2022-09-30 DIAGNOSIS — E785 Hyperlipidemia, unspecified: Secondary | ICD-10-CM | POA: Diagnosis not present

## 2022-09-30 DIAGNOSIS — R296 Repeated falls: Secondary | ICD-10-CM | POA: Diagnosis not present

## 2022-09-30 DIAGNOSIS — N390 Urinary tract infection, site not specified: Secondary | ICD-10-CM

## 2022-09-30 DIAGNOSIS — J449 Chronic obstructive pulmonary disease, unspecified: Secondary | ICD-10-CM | POA: Diagnosis not present

## 2022-09-30 LAB — URINALYSIS, COMPLETE
Bilirubin, UA: NEGATIVE
Glucose, UA: NEGATIVE
Ketones, UA: NEGATIVE
Leukocytes,UA: NEGATIVE
Nitrite, UA: NEGATIVE
Protein,UA: NEGATIVE
RBC, UA: NEGATIVE
Specific Gravity, UA: 1.03 (ref 1.005–1.030)
Urobilinogen, Ur: 0.2 mg/dL (ref 0.2–1.0)
pH, UA: 6 (ref 5.0–7.5)

## 2022-09-30 LAB — MICROSCOPIC EXAMINATION

## 2022-09-30 NOTE — Progress Notes (Unsigned)
09/30/2022 1:12 PM   Amanda Porter [birth year] [id number]  CC: Chief Complaint  Patient presents with   Follow-up   HPI: Amanda Porter is a 76 y.o. female with PMH dementia, DM 2, CVA, intermittent difficulty voiding on Flomax, possible recurrent UTI, and normal pressure hydrocephalus with recent VP shunt malfunction now s/p shunt revision with Dr. Izora Porter 11 days ago who presents today for symptom recheck.  She is accompanied today by her daughter, Amanda Porter, who contributes to HPI.   She was originally scheduled for shunt revision on 10/7, but suffered a fall with hip fracture and underwent right hip hemiarthroplasty with Dr. Harlow Porter on 10/8 instead. Her shunt revision was delayed and performed on 10/20.  Today [name] and Amanda Porter agree that she is doing much better since undergoing shunt revision. Her cognitive status has significantly improved and she is much more conversant than prior. They elected for Foley catheter placement for much of this month due to concerns for falls, but she accidentally removed her Foley around 10/24. It was replaced and subsequently removed on 10/27.  Since her Foley has been out, she denies dysuria or gross hematuria. She has been spontaneously voiding and having some episodes of urinary incontinence associated with urge. She remains on Flomax and is taking it in the morning.  She is ambulating with assistance at SNF at this point and reports her dizziness has significantly improved since undergoing shunt revision.  In-office catheterized UA today pan-negative; urine microscopy with granular casts, amorphous sediment, and moderate bacteria. Measured residual 43mL.  PMH: Past Medical History:  Diagnosis Date   Acute respiratory failure requiring reintubation (Amanda Porter) 11/05/2021   Anemia    Anxiety    Anxiety and depression    COPD (chronic obstructive pulmonary disease) (HCC)    CVA (cerebral vascular accident) ([place])    CVA (cerebral vascular accident)  ([place])    L sided deficits   Dementia ([place])    Depression    Diabetes mellitus ([place])    Type 2   Diabetes mellitus without complication ([place])    Dysphagia    Dysphasia    Falls    GERD (gastroesophageal reflux disease)    History of lung cancer    Hyperlipidemia    Hypertension    Influenza A 12/13/2021   Memory disturbance 11/20/2017   Normal pressure hydrocephalus ([place]) 2018   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    Surgical History: Past Surgical History:  Procedure Laterality Date   HIP ARTHROPLASTY Right 09/07/2022   Procedure: ARTHROPLASTY BIPOLAR HIP (HEMIARTHROPLASTY);  Surgeon: Amanda Sheehan, MD;  Location: [facility] ORS;  Service: Orthopedics;  Laterality: Right;   IR FL GUIDED LOC OF NEEDLE/CATH TIP FOR SPINAL INJECTION RT  07/25/2022   LUNG REMOVAL, PARTIAL     left upper lobe   SHUNT REVISION VENTRICULAR-PERITONEAL N/A 09/19/2022   Procedure: VENTRICULOPERITONEAL SHUNT REVISION;  Surgeon: Amanda Maw, MD;  Location: [facility] ORS;  Service: Neurosurgery;  Laterality: N/A;   VENTRICULOPERITONEAL SHUNT  12/09/2016   VENTRICULOPERITONEAL SHUNT  10/2018    Home Medications:  Allergies as of 09/30/2022       Reactions   Lexapro [escitalopram Oxalate] Other (See Comments)   Dizziness, nausea, fatigue   Neosporin  [  neomycin-polymyxin-gramicidin] Rash   (Neosporin)   Neosporin [bacitracin-polymyxin B] Rash        Medication List        Accurate as of September 30, 2022  1:12 PM. If you have any questions, ask your nurse or doctor.          Accu-Chek Softclix Lancets lancets Use as instructed   Albuterol Sulfate 2.5 MG/0.5ML Nebu Inhale 2.5 mg into the lungs every 6 (six) hours as needed.   albuterol 108 (90 Base) MCG/ACT  inhaler Commonly known as: VENTOLIN HFA INHALE 2 PUFFS BY MOUTH EVERY 4 HOURS AS NEEDED   amLODipine 2.5 MG tablet Commonly known as: NORVASC Take 1 tablet (2.5 mg total) by mouth daily. for blood pressure. What changed: when to take this   aspirin 81 MG chewable tablet Chew 1 tablet (81 mg total) by mouth daily.   buPROPion 300 MG 24 hr tablet Commonly known as: WELLBUTRIN XL Take 1 tablet (300 mg total) by mouth daily with breakfast.   busPIRone 15 MG tablet Commonly known as: BUSPAR Take 45 mg by mouth in the morning.   busPIRone 30 MG tablet Commonly known as: BUSPAR Take 1 tablet (30 mg total) by mouth at bedtime.   clonazePAM 0.5 MG tablet Commonly known as: KLONOPIN Take 1 tablet (0.5 mg total) by mouth at bedtime.   enoxaparin 40 MG/0.4ML injection Commonly known as: LOVENOX Inject 0.4 mLs (40 mg total) into the skin daily for 13 days.   folic acid 1 MG tablet Commonly known as: FOLVITE Take 1 tablet (1 mg total) by mouth daily.   glipiZIDE 2.5 MG 24 hr tablet Commonly known as: GLUCOTROL XL TAKE 1 TABLET (2.5 MG TOTAL) BY MOUTH DAILY WITH BREAKFAST. FOR DIABETES.   glucose blood test strip Use as instructed   insulin aspart 100 UNIT/ML injection Commonly known as: novoLOG Inject 0-9 Units into the skin 3 (three) times daily with meals. Sliding scale insulin Less than 70 initiate hypoglycemia protocol 70-120  0 units 120-150 1 unit 151-200 2 units 201-250 3 units 251-300 5 units 301-350 7 units 351-400 9 units Greater than 400 call MD   memantine 5 MG tablet Commonly known as: NAMENDA Take 1 tablet (5 mg total) by mouth 2 (two) times daily. For memory.   Premarin vaginal cream Generic drug: conjugated estrogens Apply one pea-sized amount around the opening of the urethra daily for 2 weeks, then 3 times weekly moving forward.   rosuvastatin 20 MG tablet Commonly known as: CRESTOR TAKE 1 TABLET (20 MG TOTAL) BY MOUTH EVERY EVENING. FOR CHOLESTEROL.    Stiolto Respimat 2.5-2.5 MCG/ACT Aers Generic drug: Tiotropium Bromide-Olodaterol INHALE 2 PUFFS BY MOUTH INTO THE LUNGS DAILY What changed: See the new instructions.   tamsulosin 0.4 MG Caps capsule Commonly known as: FLOMAX Take 1 capsule (0.4 mg total) by mouth daily. For urine retention        Allergies:  Allergies  Allergen Reactions   Lexapro [Escitalopram Oxalate] Other (See Comments)    Dizziness, nausea, fatigue   Neosporin  [Neomycin-Polymyxin-Gramicidin] Rash    (Neosporin)   Neosporin [Bacitracin-Polymyxin B] Rash    Family History: Family History  Problem Relation Age of Onset   Pneumonia Mother    Cancer Father    Diabetes Sister    Diabetes Sister    Breast cancer Neg Hx     Social History:   reports that he quit smoking about 5 years ago. His smoking use included cigarettes. He has a  25.00 pack-year smoking history. He has never used smokeless tobacco. He reports that he does not currently use alcohol. He reports that he does not currently use drugs.  Physical Exam: BP 120/66   Pulse 88   Ht 5\' 2"  (1.575 m)   Wt 103 lb (46.7 kg)   BMI 18.84 kg/m   Constitutional:  Alert and oriented, no acute distress, nontoxic appearing HEENT: [place], AT Cardiovascular: No clubbing, cyanosis, or edema Respiratory: Normal respiratory effort, no increased work of breathing Skin: No rashes, bruises or suspicious lesions Neurologic: Grossly intact, no focal deficits, moving all 4 extremities Psychiatric: Normal mood and affect  Laboratory Data: Results for orders placed or performed in visit on 09/30/22  Microscopic Examination   Urine  Result Value Ref Range   WBC, UA 0-5 0 - 5 /hpf   RBC, Urine 0-2 0 - 2 /hpf   Epithelial Cells (non renal) 0-10 0 - 10 /hpf   Casts Present (A) None seen /lpf   Cast Type Granular casts (A) N/A   Crystals Present (A) N/A   Crystal Type Amorphous Sediment N/A   Mucus, UA Present (A) Not Estab.   Bacteria, UA Moderate (A) None  seen/Few  Urinalysis, Complete  Result Value Ref Range   Specific Gravity, UA 1.030 1.005 - 1.030   pH, UA 6.0 5.0 - 7.5   Color, UA Yellow Yellow   Appearance Ur Clear Clear   Leukocytes,UA Negative Negative   Protein,UA Negative Negative/Trace   Glucose, UA Negative Negative   Ketones, UA Negative Negative   RBC, UA Negative Negative   Bilirubin, UA Negative Negative   Urobilinogen, Ur 0.2 0.2 - 1.0 mg/dL   Nitrite, UA Negative Negative   Microscopic Examination See below:    Assessment & Plan:   1. Incomplete bladder emptying She is emptying appropriately today; I do not recommend Foley replacement especially since she is ambulatory. We discussed stopping Flomax to reduce her risk for falls, but they prefer to continue for now. I provided orders to switch her daily dose to bedtime. If residual remains WNL at her next visit, will consider stopping Flomax.  2. Recurrent UTI Cath UA today rather benign and she is not clinically infected, will confirm with culture. Will plan for repeat cath UA and measured residual in 4 weeks to allow more time for postop recovery. - Urinalysis, Complete - CULTURE, URINE COMPREHENSIVE  3. Urinary incontinence, unspecified type Recently associated with urge, previously at least in part due to NPH. Will plan for symptom recheck in 4 weeks; if she is having persistent urge incontinence at that point but emptying well, may consider a trial of low dose OAB medications.  Return in about 4 weeks (around 10/28/2022) for Symptom recheck, cath UA, measured residual.  Debroah Loop, [facility]  [facility] [address] [place], [place] [address] [phone]

## 2022-09-30 NOTE — Progress Notes (Unsigned)
In and Out Catheterization  Patient is present today for a I & O catheterization due to incomplete bladder emptying. Patient was cleaned and prepped in a sterile fashion with betadine . A 14FR cath was inserted no complications were noted , 5ml of urine return was noted, urine was yellow in color. A clean urine sample was collected for UA. Bladder was drained  And catheter was removed with out difficulty.    Performed by: Elberta Leatherwood, [place]

## 2022-10-01 ENCOUNTER — Ambulatory Visit: Payer: Medicare PPO | Admitting: Physician Assistant

## 2022-10-01 DIAGNOSIS — J440 Chronic obstructive pulmonary disease with acute lower respiratory infection: Secondary | ICD-10-CM | POA: Diagnosis not present

## 2022-10-01 DIAGNOSIS — M6281 Muscle weakness (generalized): Secondary | ICD-10-CM | POA: Diagnosis not present

## 2022-10-01 DIAGNOSIS — E876 Hypokalemia: Secondary | ICD-10-CM | POA: Diagnosis not present

## 2022-10-01 DIAGNOSIS — F445 Conversion disorder with seizures or convulsions: Secondary | ICD-10-CM | POA: Diagnosis not present

## 2022-10-01 DIAGNOSIS — R296 Repeated falls: Secondary | ICD-10-CM | POA: Diagnosis not present

## 2022-10-01 DIAGNOSIS — E1165 Type 2 diabetes mellitus with hyperglycemia: Secondary | ICD-10-CM | POA: Diagnosis not present

## 2022-10-01 DIAGNOSIS — S72001A Fracture of unspecified part of neck of right femur, initial encounter for closed fracture: Secondary | ICD-10-CM | POA: Diagnosis not present

## 2022-10-01 DIAGNOSIS — I10 Essential (primary) hypertension: Secondary | ICD-10-CM | POA: Diagnosis not present

## 2022-10-01 DIAGNOSIS — G912 (Idiopathic) normal pressure hydrocephalus: Secondary | ICD-10-CM | POA: Diagnosis not present

## 2022-10-01 NOTE — Progress Notes (Unsigned)
   REFERRING PHYSICIAN:  Pleas Koch, [place],  [place] [address]  DOS: 09/19/22  Removal and replacement of ventriculoperitoneal shunt   HISTORY OF PRESENT ILLNESS: Amanda Porter is 2 weeks status post Removal and replacement of ventriculoperitoneal shunt. She is also s/p right hip hemiarthroplasty for right hip fracture by Dr. Harlow Mares on 09/07/22.   She is doing well with no complaints. Her balance is much better since the surgery. She was having daily headaches and these are now only occasional. She's had some occasional dizziness as well.   Daughter states her mental clarity is much improved since the surgery.   She is at [facility]. She is working with PT/OT.    PHYSICAL EXAMINATION:  NEUROLOGICAL:  General: In no acute distress.   Awake and alert. Knows her name. States she is in the hospital and the and the year is 8. Pupils equal round and reactive to light.  Facial tone is symmetric.    Strength: Side Biceps Triceps Deltoid Interossei Grip Wrist Ext. Wrist Flex.  R 5 5 5 5 5 5 5   L 5 5 5 5 5 5 5    Incision c/d/I, staples have been removed.  Abdominal incision is healing well. No signs of erythema or infection noted.   Imaging:  Nothing new to review.   Assessment / Plan: Amanda Porter is doing well s/p above surgery. Treatment options reviewed with patient and her daughter. Following plan made:   - Continue with PT/OT at SNF.  - Staples removed from cranial incision.  - Reviewed wound care. Okay to shower.  - Follow up as scheduled in 4 weeks and prn.   Advised to contact the office if any questions or concerns arise.   Geronimo Boot PA-C Dept of Neurosurgery

## 2022-10-02 ENCOUNTER — Ambulatory Visit (INDEPENDENT_AMBULATORY_CARE_PROVIDER_SITE_OTHER): Payer: Medicare PPO | Admitting: Orthopedic Surgery

## 2022-10-02 ENCOUNTER — Encounter: Payer: Self-pay | Admitting: Orthopedic Surgery

## 2022-10-02 VITALS — BP 136/60 | Temp 98.6°F | Ht 62.0 in | Wt 103.0 lb

## 2022-10-02 DIAGNOSIS — G912 (Idiopathic) normal pressure hydrocephalus: Secondary | ICD-10-CM

## 2022-10-02 DIAGNOSIS — T85618D Breakdown (mechanical) of other specified internal prosthetic devices, implants and grafts, subsequent encounter: Secondary | ICD-10-CM

## 2022-10-02 DIAGNOSIS — Z09 Encounter for follow-up examination after completed treatment for conditions other than malignant neoplasm: Secondary | ICD-10-CM

## 2022-10-02 DIAGNOSIS — Z982 Presence of cerebrospinal fluid drainage device: Secondary | ICD-10-CM

## 2022-10-03 DIAGNOSIS — J449 Chronic obstructive pulmonary disease, unspecified: Secondary | ICD-10-CM | POA: Diagnosis not present

## 2022-10-03 DIAGNOSIS — R339 Retention of urine, unspecified: Secondary | ICD-10-CM | POA: Diagnosis not present

## 2022-10-03 DIAGNOSIS — I10 Essential (primary) hypertension: Secondary | ICD-10-CM | POA: Diagnosis not present

## 2022-10-03 DIAGNOSIS — E118 Type 2 diabetes mellitus with unspecified complications: Secondary | ICD-10-CM | POA: Diagnosis not present

## 2022-10-03 DIAGNOSIS — S72001D Fracture of unspecified part of neck of right femur, subsequent encounter for closed fracture with routine healing: Secondary | ICD-10-CM | POA: Diagnosis not present

## 2022-10-03 DIAGNOSIS — F0390 Unspecified dementia without behavioral disturbance: Secondary | ICD-10-CM | POA: Diagnosis not present

## 2022-10-03 DIAGNOSIS — R918 Other nonspecific abnormal finding of lung field: Secondary | ICD-10-CM | POA: Diagnosis not present

## 2022-10-03 DIAGNOSIS — D509 Iron deficiency anemia, unspecified: Secondary | ICD-10-CM | POA: Diagnosis not present

## 2022-10-03 DIAGNOSIS — E785 Hyperlipidemia, unspecified: Secondary | ICD-10-CM | POA: Diagnosis not present

## 2022-10-03 LAB — CULTURE, URINE COMPREHENSIVE

## 2022-10-06 DIAGNOSIS — F445 Conversion disorder with seizures or convulsions: Secondary | ICD-10-CM | POA: Diagnosis not present

## 2022-10-06 DIAGNOSIS — G912 (Idiopathic) normal pressure hydrocephalus: Secondary | ICD-10-CM | POA: Diagnosis not present

## 2022-10-06 DIAGNOSIS — M6281 Muscle weakness (generalized): Secondary | ICD-10-CM | POA: Diagnosis not present

## 2022-10-06 DIAGNOSIS — I10 Essential (primary) hypertension: Secondary | ICD-10-CM | POA: Diagnosis not present

## 2022-10-06 DIAGNOSIS — F331 Major depressive disorder, recurrent, moderate: Secondary | ICD-10-CM | POA: Diagnosis not present

## 2022-10-06 DIAGNOSIS — F03B18 Unspecified dementia, moderate, with other behavioral disturbance: Secondary | ICD-10-CM | POA: Diagnosis not present

## 2022-10-06 DIAGNOSIS — R296 Repeated falls: Secondary | ICD-10-CM | POA: Diagnosis not present

## 2022-10-06 DIAGNOSIS — E1165 Type 2 diabetes mellitus with hyperglycemia: Secondary | ICD-10-CM | POA: Diagnosis not present

## 2022-10-06 DIAGNOSIS — E876 Hypokalemia: Secondary | ICD-10-CM | POA: Diagnosis not present

## 2022-10-06 DIAGNOSIS — J440 Chronic obstructive pulmonary disease with acute lower respiratory infection: Secondary | ICD-10-CM | POA: Diagnosis not present

## 2022-10-06 DIAGNOSIS — S72001A Fracture of unspecified part of neck of right femur, initial encounter for closed fracture: Secondary | ICD-10-CM | POA: Diagnosis not present

## 2022-10-06 DIAGNOSIS — F411 Generalized anxiety disorder: Secondary | ICD-10-CM | POA: Diagnosis not present

## 2022-10-07 DIAGNOSIS — E785 Hyperlipidemia, unspecified: Secondary | ICD-10-CM | POA: Diagnosis not present

## 2022-10-07 DIAGNOSIS — E118 Type 2 diabetes mellitus with unspecified complications: Secondary | ICD-10-CM | POA: Diagnosis not present

## 2022-10-07 DIAGNOSIS — R918 Other nonspecific abnormal finding of lung field: Secondary | ICD-10-CM | POA: Diagnosis not present

## 2022-10-07 DIAGNOSIS — S72001D Fracture of unspecified part of neck of right femur, subsequent encounter for closed fracture with routine healing: Secondary | ICD-10-CM | POA: Diagnosis not present

## 2022-10-07 DIAGNOSIS — I10 Essential (primary) hypertension: Secondary | ICD-10-CM | POA: Diagnosis not present

## 2022-10-07 DIAGNOSIS — D509 Iron deficiency anemia, unspecified: Secondary | ICD-10-CM | POA: Diagnosis not present

## 2022-10-07 DIAGNOSIS — R339 Retention of urine, unspecified: Secondary | ICD-10-CM | POA: Diagnosis not present

## 2022-10-07 DIAGNOSIS — J449 Chronic obstructive pulmonary disease, unspecified: Secondary | ICD-10-CM | POA: Diagnosis not present

## 2022-10-07 DIAGNOSIS — F0390 Unspecified dementia without behavioral disturbance: Secondary | ICD-10-CM | POA: Diagnosis not present

## 2022-10-08 DIAGNOSIS — S72001A Fracture of unspecified part of neck of right femur, initial encounter for closed fracture: Secondary | ICD-10-CM | POA: Diagnosis not present

## 2022-10-08 DIAGNOSIS — E876 Hypokalemia: Secondary | ICD-10-CM | POA: Diagnosis not present

## 2022-10-08 DIAGNOSIS — M6281 Muscle weakness (generalized): Secondary | ICD-10-CM | POA: Diagnosis not present

## 2022-10-08 DIAGNOSIS — F445 Conversion disorder with seizures or convulsions: Secondary | ICD-10-CM | POA: Diagnosis not present

## 2022-10-08 DIAGNOSIS — R296 Repeated falls: Secondary | ICD-10-CM | POA: Diagnosis not present

## 2022-10-08 DIAGNOSIS — I10 Essential (primary) hypertension: Secondary | ICD-10-CM | POA: Diagnosis not present

## 2022-10-08 DIAGNOSIS — J440 Chronic obstructive pulmonary disease with acute lower respiratory infection: Secondary | ICD-10-CM | POA: Diagnosis not present

## 2022-10-08 DIAGNOSIS — E1165 Type 2 diabetes mellitus with hyperglycemia: Secondary | ICD-10-CM | POA: Diagnosis not present

## 2022-10-08 DIAGNOSIS — G912 (Idiopathic) normal pressure hydrocephalus: Secondary | ICD-10-CM | POA: Diagnosis not present

## 2022-10-10 ENCOUNTER — Telehealth: Payer: Self-pay | Admitting: Primary Care

## 2022-10-10 DIAGNOSIS — S72001A Fracture of unspecified part of neck of right femur, initial encounter for closed fracture: Secondary | ICD-10-CM | POA: Diagnosis not present

## 2022-10-10 DIAGNOSIS — R569 Unspecified convulsions: Secondary | ICD-10-CM | POA: Diagnosis not present

## 2022-10-10 DIAGNOSIS — I10 Essential (primary) hypertension: Secondary | ICD-10-CM | POA: Diagnosis not present

## 2022-10-10 DIAGNOSIS — E1165 Type 2 diabetes mellitus with hyperglycemia: Secondary | ICD-10-CM | POA: Diagnosis not present

## 2022-10-10 DIAGNOSIS — E876 Hypokalemia: Secondary | ICD-10-CM | POA: Diagnosis not present

## 2022-10-10 DIAGNOSIS — R296 Repeated falls: Secondary | ICD-10-CM | POA: Diagnosis not present

## 2022-10-10 DIAGNOSIS — G912 (Idiopathic) normal pressure hydrocephalus: Secondary | ICD-10-CM | POA: Diagnosis not present

## 2022-10-10 DIAGNOSIS — J984 Other disorders of lung: Secondary | ICD-10-CM | POA: Diagnosis not present

## 2022-10-10 DIAGNOSIS — J440 Chronic obstructive pulmonary disease with acute lower respiratory infection: Secondary | ICD-10-CM | POA: Diagnosis not present

## 2022-10-10 DIAGNOSIS — F445 Conversion disorder with seizures or convulsions: Secondary | ICD-10-CM | POA: Diagnosis not present

## 2022-10-10 DIAGNOSIS — M6281 Muscle weakness (generalized): Secondary | ICD-10-CM | POA: Diagnosis not present

## 2022-10-10 NOTE — Telephone Encounter (Signed)
Patient daughter called and stated patient need a note for her to return back to adult day care. Call back number [phone]

## 2022-10-10 NOTE — Telephone Encounter (Signed)
Called patients daughter she states patient has just been discharged from [facility] today and the Adult day care she stays at requires a letter stating she's cleared to return. Per Joellen this is something we will need a visit for to evaluate. Anda Kraft is out of the office until 11/14. Patient states she needs this letter before the patients appointment with Adult day care 11/13 @1 :00.  Will check with Catalina Antigua or Tabitha to see if they are comfortable seeing patient for this.

## 2022-10-10 NOTE — Telephone Encounter (Signed)
Spoke with Catalina Antigua, he agreed to see patient Monday in person for evaluation of clearance to adult daycare. Scheduled 11/13 @10 :40.

## 2022-10-13 ENCOUNTER — Ambulatory Visit: Payer: Medicare PPO | Admitting: Nurse Practitioner

## 2022-10-13 VITALS — BP 128/72 | HR 96 | Temp 96.8°F | Resp 14 | Ht 62.0 in | Wt 99.2 lb

## 2022-10-13 DIAGNOSIS — R710 Precipitous drop in hematocrit: Secondary | ICD-10-CM

## 2022-10-13 DIAGNOSIS — Z96641 Presence of right artificial hip joint: Secondary | ICD-10-CM | POA: Insufficient documentation

## 2022-10-13 DIAGNOSIS — Z982 Presence of cerebrospinal fluid drainage device: Secondary | ICD-10-CM | POA: Diagnosis not present

## 2022-10-13 DIAGNOSIS — G912 (Idiopathic) normal pressure hydrocephalus: Secondary | ICD-10-CM

## 2022-10-13 LAB — BASIC METABOLIC PANEL
BUN: 13 mg/dL (ref 6–23)
CO2: 34 mEq/L — ABNORMAL HIGH (ref 19–32)
Calcium: 9 mg/dL (ref 8.4–10.5)
Chloride: 102 mEq/L (ref 96–112)
Creatinine, Ser: 0.75 mg/dL (ref 0.40–1.20)
GFR: 76.69 mL/min (ref >60.00)
Glucose, Bld: 214 mg/dL — ABNORMAL HIGH (ref 70–99)
Potassium: 3.6 mEq/L (ref 3.5–5.1)
Sodium: 143 mEq/L (ref 135–145)

## 2022-10-13 LAB — CBC
HCT: 27.6 % — ABNORMAL LOW (ref 36.0–46.0)
Hemoglobin: 8.6 g/dL — ABNORMAL LOW (ref 12.0–15.0)
MCHC: 31.1 g/dL (ref 30.0–36.0)
MCV: 88.5 fl (ref 78.0–100.0)
Platelets: 379 10*3/uL (ref 150.0–400.0)
RBC: 3.12 Mil/uL — ABNORMAL LOW (ref 3.87–5.11)
RDW: 16.2 % — ABNORMAL HIGH (ref 11.5–15.5)
WBC: 7.3 10*3/uL (ref 4.0–10.5)

## 2022-10-13 NOTE — Progress Notes (Signed)
Established Patient Office Visit  Subjective   Patient ID: Amanda Porter, adult    DOB: 1945-12-15  Age: 76 y.o. MRN: [id number]  Chief Complaint  Patient presents with   Clearance to go back to adult day care    discharged from [facility] on 10/11/22 and the Adult day care she stays at requires a letter stating she's cleared to return    HPI   Femoral fracture right side: Patinet was admitted to the hospital on 09/06/2022 for a fall and closed dispalced fx of the right femoral. Had a hip arthroplasty with Dr. Harlow Mares on 09/07/2022  States that she uses roll States that she lives with her daughter. States that she has had approx 5 falls in the past year. According to her daugther she does not use her assistive devices at home like she is suppose to   VP shunt replacement: Patient had a VP shunt malfunction and was admitted to the hospital on 09/19/2022 where she went to the OR for revision done by Dr. Meade Maw.  Patient was seen by surgery on 10/02/2022 and was noted to be healing well.  Patient needs clearance from primary care provider for her to return to adult daycare.  States she participates in the day center approximately 3 times a week.  States that she was discharged from [facility] in a letter stating she is cleared to return to the day program upon questioning what is done at the day program,  simple activities but she will need to be able to stand pivot to go to the restroom.  Patient was able to stand up and ambulate with assistance in office.   Review of Systems  Constitutional:  Negative for chills and fever.  Respiratory:  Negative for shortness of breath.   Cardiovascular:  Negative for chest pain.  Neurological:  Positive for dizziness (has improved since shunt revision).      Objective:     BP 128/72   Pulse 96   Temp (!) 96.8 F (36 C)   Resp 14   Ht 5\' 2"  (1.575 m)   Wt 99 lb 4 oz (45 kg)   SpO2 98% Comment: on 3 liters O2  BMI 18.15 kg/m     Physical Exam Vitals and nursing note reviewed.  Constitutional:      Appearance: Normal appearance.     Comments: Patient presents to clinic in a wheelchair.  She is able to get out of the wheelchair with minimal assistance.  She is able to ambulate to the door and back with assistance.  Patient normally use an assistive device at home patient does not have one with her today.  Cardiovascular:     Rate and Rhythm: Normal rate and regular rhythm.     Heart sounds: Normal heart sounds.  Pulmonary:     Effort: Pulmonary effort is normal.     Breath sounds: Normal breath sounds.     Comments: Wears oxygen at baseline Musculoskeletal:     Right lower leg: No edema.     Left lower leg: No edema.  Neurological:     Mental Status: Amanda Porter "[name]" is alert. Mental status is at baseline.    No results found for any visits on 10/13/22.    The ASCVD Risk score (Arnett DK, et al., 2019) failed to calculate for the following reasons:   The patient has a prior MI or stroke diagnosis    Assessment & Plan:   Problem List  Items Addressed This Visit       Nervous and Auditory   Normal pressure hydrocephalus (HCC) - Primary    Patient recently had her VP shunt revision with Dr. Elayne Guerin.  She has been discharged from rehab and has 2 follow-ups with him already scheduled.  Patient seems to be doing well clinically in office.  Has had a decrease in dizziness since VP shunt revision.  Continue following up with neurosurgery as recommended        Other   Decreased hemoglobin    Patient does have a history of anemia.  States she was on iron in the past but has quit as of late.  Patient's daughter at bedside would like hemoglobin recheck pending CBC today.  Patient has had history of iron infusions through hematology but no longer is followed by them.      Relevant Orders   CBC   Basic metabolic panel   S/P VP shunt    Recently had a revision of her VP shunt on 09/19/2022  with Dr. Cari Caraway.      Status post right hip replacement    Patient sustained a fall breaking her right femoral neck.  Dr. Jorge Mandril did a replacement in the hospital.  Patient recently discharged from [facility].  Patient is ambulatory in office.       No follow-ups on file.    Romilda Garret, NP

## 2022-10-13 NOTE — Patient Instructions (Signed)
Nice to see you today I will be in touch with the labs once I have them

## 2022-10-13 NOTE — Assessment & Plan Note (Signed)
Patient sustained a fall breaking her right femoral neck.  Dr. Jorge Mandril did a replacement in the hospital.  Patient recently discharged from [facility].  Patient is ambulatory in office.

## 2022-10-13 NOTE — Assessment & Plan Note (Signed)
Patient recently had her VP shunt revision with Dr. Elayne Guerin.  She has been discharged from rehab and has 2 follow-ups with him already scheduled.  Patient seems to be doing well clinically in office.  Has had a decrease in dizziness since VP shunt revision.  Continue following up with neurosurgery as recommended

## 2022-10-13 NOTE — Assessment & Plan Note (Signed)
Recently had a revision of her VP shunt on 09/19/2022 with Dr. Cari Caraway.

## 2022-10-13 NOTE — Assessment & Plan Note (Signed)
Patient does have a history of anemia.  States she was on iron in the past but has quit as of late.  Patient's daughter at bedside would like hemoglobin recheck pending CBC today.  Patient has had history of iron infusions through hematology but no longer is followed by them.

## 2022-10-14 ENCOUNTER — Other Ambulatory Visit: Payer: Medicare PPO

## 2022-10-14 ENCOUNTER — Encounter: Payer: Medicare PPO | Admitting: Neurosurgery

## 2022-10-14 DIAGNOSIS — R7989 Other specified abnormal findings of blood chemistry: Secondary | ICD-10-CM | POA: Diagnosis not present

## 2022-10-15 LAB — IRON,TIBC AND FERRITIN PANEL
%SAT: 9 % — ABNORMAL LOW (ref 16–45)
Ferritin: 57 ng/mL (ref 16–288)
Iron: 31 ug/dL — ABNORMAL LOW (ref 45–160)
TIBC: 341 ug/dL (ref 250–450)

## 2022-10-20 ENCOUNTER — Ambulatory Visit (INDEPENDENT_AMBULATORY_CARE_PROVIDER_SITE_OTHER): Payer: Medicare PPO

## 2022-10-20 VITALS — Ht 62.0 in | Wt 99.0 lb

## 2022-10-20 DIAGNOSIS — Z0000 Encounter for general adult medical examination without abnormal findings: Secondary | ICD-10-CM

## 2022-10-20 NOTE — Patient Instructions (Addendum)
Amanda Porter , Thank you for taking time to come for your Medicare Wellness Visit. I appreciate your ongoing commitment to your health goals. Please review the following plan we discussed and let me know if I can assist you in the future.   These are the goals we discussed:       DIET - INCREASE WATER INTAKE     Stay hydrated   Stay active        This is a list of the screening recommended for you and due dates:  Health Maintenance  Topic Date Due   Complete foot exam   08/28/2019   Yearly kidney health urinalysis for diabetes  01/16/2021   Eye exam for diabetics  09/24/2022   Hemoglobin A1C  10/05/2022   Zoster (Shingles) Vaccine (1 of 2) 11/17/2022*   Pneumonia Vaccine (3 - PPSV23 or PCV20) 02/16/2023*   COVID-19 Vaccine (4 - Pfizer series) 02/16/2023*   Flu Shot  03/01/2023*   Screening for Lung Cancer  09/07/2023   Yearly kidney function blood test for diabetes  10/14/2023   Medicare Annual Wellness Visit  10/21/2023   DEXA scan (bone density measurement)  Completed   Hepatitis C Screening: USPSTF Recommendation to screen - Ages 90-79 yo.  Completed   HPV Vaccine  Aged Out   Colon Cancer Screening  Discontinued  *Topic was postponed. The date shown is not the original due date.    Advanced directives: on file  Next appointment: Follow up in one year for your annual wellness visit    Preventive Care 65 Years and Older, Female Preventive care refers to lifestyle choices and visits with your health care provider that can promote health and wellness. What does preventive care include? A yearly physical exam. This is also called an annual well check. Dental exams once or twice a year. Routine eye exams. Ask your health care provider how often you should have your eyes checked. Personal lifestyle choices, including: Daily care of your teeth and gums. Regular physical activity. Eating a healthy diet. Avoiding tobacco and drug use. Limiting alcohol use. Practicing safe  sex. Taking low-dose aspirin every day. Taking vitamin and mineral supplements as recommended by your health care provider. What happens during an annual well check? The services and screenings done by your health care provider during your annual well check will depend on your age, overall health, lifestyle risk factors, and family history of disease. Counseling  Your health care provider may ask you questions about your: Alcohol use. Tobacco use. Drug use. Emotional well-being. Home and relationship well-being. Sexual activity. Eating habits. History of falls. Memory and ability to understand (cognition). Work and work Statistician. Reproductive health. Screening  You may have the following tests or measurements: Height, weight, and BMI. Blood pressure. Lipid and cholesterol levels. These may be checked every 5 years, or more frequently if you are over 68 years old. Skin check. Lung cancer screening. You may have this screening every year starting at age 69 if you have a 30-pack-year history of smoking and currently smoke or have quit within the past 15 years. Fecal occult blood test (FOBT) of the stool. You may have this test every year starting at age 47. Flexible sigmoidoscopy or colonoscopy. You may have a sigmoidoscopy every 5 years or a colonoscopy every 10 years starting at age 26. Hepatitis C blood test. Hepatitis B blood test. Sexually transmitted disease (STD) testing. Diabetes screening. This is done by checking your blood sugar (glucose) after you have not eaten  for a while (fasting). You may have this done every 1-3 years. Bone density scan. This is done to screen for osteoporosis. You may have this done starting at age 9. Mammogram. This may be done every 1-2 years. Talk to your health care provider about how often you should have regular mammograms. Talk with your health care provider about your test results, treatment options, and if necessary, the need for more  tests. Vaccines  Your health care provider may recommend certain vaccines, such as: Influenza vaccine. This is recommended every year. Tetanus, diphtheria, and acellular pertussis (Tdap, Td) vaccine. You may need a Td booster every 10 years. Zoster vaccine. You may need this after age 61. Pneumococcal 13-valent conjugate (PCV13) vaccine. One dose is recommended after age 42. Pneumococcal polysaccharide (PPSV23) vaccine. One dose is recommended after age 79. Talk to your health care provider about which screenings and vaccines you need and how often you need them. This information is not intended to replace advice given to you by your health care provider. Make sure you discuss any questions you have with your health care provider. Document Released: 12/14/2015 Document Revised: 08/06/2016 Document Reviewed: 09/18/2015 Elsevier Interactive Patient Education  2017 [place] Prevention in the Home Falls can cause injuries. They can happen to people of all ages. There are many things you can do to make your home safe and to help prevent falls. What can I do on the outside of my home? Regularly fix the edges of walkways and driveways and fix any cracks. Remove anything that might make you trip as you walk through a door, such as a raised step or threshold. Trim any bushes or trees on the path to your home. Use bright outdoor lighting. Clear any walking paths of anything that might make someone trip, such as rocks or tools. Regularly check to see if handrails are loose or broken. Make sure that both sides of any steps have handrails. Any raised decks and porches should have guardrails on the edges. Have any leaves, snow, or ice cleared regularly. Use sand or salt on walking paths during winter. Clean up any spills in your garage right away. This includes oil or grease spills. What can I do in the bathroom? Use night lights. Install grab bars by the toilet and in the tub and shower.  Do not use towel bars as grab bars. Use non-skid mats or decals in the tub or shower. If you need to sit down in the shower, use a plastic, non-slip stool. Keep the floor dry. Clean up any water that spills on the floor as soon as it happens. Remove soap buildup in the tub or shower regularly. Attach bath mats securely with double-sided non-slip rug tape. Do not have throw rugs and other things on the floor that can make you trip. What can I do in the bedroom? Use night lights. Make sure that you have a light by your bed that is easy to reach. Do not use any sheets or blankets that are too big for your bed. They should not hang down onto the floor. Have a firm chair that has side arms. You can use this for support while you get dressed. Do not have throw rugs and other things on the floor that can make you trip. What can I do in the kitchen? Clean up any spills right away. Avoid walking on wet floors. Keep items that you use a lot in easy-to-reach places. If you need to reach something  above you, use a strong step stool that has a grab bar. Keep electrical cords out of the way. Do not use floor polish or wax that makes floors slippery. If you must use wax, use non-skid floor wax. Do not have throw rugs and other things on the floor that can make you trip. What can I do with my stairs? Do not leave any items on the stairs. Make sure that there are handrails on both sides of the stairs and use them. Fix handrails that are broken or loose. Make sure that handrails are as long as the stairways. Check any carpeting to make sure that it is firmly attached to the stairs. Fix any carpet that is loose or worn. Avoid having throw rugs at the top or bottom of the stairs. If you do have throw rugs, attach them to the floor with carpet tape. Make sure that you have a light switch at the top of the stairs and the bottom of the stairs. If you do not have them, ask someone to add them for you. What else  can I do to help prevent falls? Wear shoes that: Do not have high heels. Have rubber bottoms. Are comfortable and fit you well. Are closed at the toe. Do not wear sandals. If you use a stepladder: Make sure that it is fully opened. Do not climb a closed stepladder. Make sure that both sides of the stepladder are locked into place. Ask someone to hold it for you, if possible. Clearly mark and make sure that you can see: Any grab bars or handrails. First and last steps. Where the edge of each step is. Use tools that help you move around (mobility aids) if they are needed. These include: Canes. Walkers. Scooters. Crutches. Turn on the lights when you go into a dark area. Replace any light bulbs as soon as they burn out. Set up your furniture so you have a clear path. Avoid moving your furniture around. If any of your floors are uneven, fix them. If there are any pets around you, be aware of where they are. Review your medicines with your doctor. Some medicines can make you feel dizzy. This can increase your chance of falling. Ask your doctor what other things that you can do to help prevent falls. This information is not intended to replace advice given to you by your health care provider. Make sure you discuss any questions you have with your health care provider. Document Released: 09/13/2009 Document Revised: 04/24/2016 Document Reviewed: 12/22/2014 Elsevier Interactive Patient Education  2017 [employer].

## 2022-10-20 NOTE — Progress Notes (Signed)
Subjective:   Amanda Porter is a 76 y.o. female who presents for an Initial Medicare Annual Wellness Visit.  Review of Systems    No ROS.  Medicare Wellness Virtual Visit.  Visual/audio telehealth visit, UTA vital signs.   See social history for additional risk factors.   Cardiac Risk Factors include: advanced age (>61men, >27 women);sedentary lifestyle;diabetes mellitus;hypertension     Objective:    Today's Vitals   10/20/22 1115  Weight: 99 lb (44.9 kg)  Height: 5\' 2"  (1.575 m)   Body mass index is 18.11 kg/m.     10/20/2022   11:26 AM 09/19/2022    8:41 AM 09/06/2022    5:37 PM 08/25/2022   10:53 AM 07/30/2022    7:00 AM 07/28/2022    1:00 AM 07/24/2022    6:11 PM  Advanced Directives  Does Patient Have a Medical Advance Directive? Yes Yes Yes Yes  Yes Yes  Type of Paramedic of [place];Living will [place];Living will [place] of facility DNR (pink MOST or yellow form);Healthcare Power of [employer] of facility DNR (pink MOST or yellow form) Out of facility DNR (pink MOST or yellow form)  Does patient want to make changes to medical advance directive? No - Patient declined No - Patient declined No - Patient declined No - Patient declined No - Patient declined    Copy of [place] in Chart? Yes - validated most recent copy scanned in chart (See row information) Yes - validated most recent copy scanned in chart (See row information)  No - copy requested       Current Medications (verified) Outpatient Encounter Medications as of 10/20/2022  Medication Sig   Accu-Chek Softclix Lancets lancets Use as instructed   albuterol (VENTOLIN HFA) 108 (90 Base) MCG/ACT inhaler INHALE 2 PUFFS BY MOUTH EVERY 4 HOURS AS NEEDED   Albuterol Sulfate 2.5 MG/0.5ML NEBU Inhale 2.5 mg into the lungs every 6 (six) hours as needed.   amLODipine (NORVASC) 2.5 MG tablet Take 1 tablet (2.5 mg total) by mouth  daily. for blood pressure. (Patient taking differently: Take 2.5 mg by mouth at bedtime. for blood pressure.)   aspirin 81 MG chewable tablet Chew 1 tablet (81 mg total) by mouth daily.   buPROPion (WELLBUTRIN XL) 300 MG 24 hr tablet Take 1 tablet (300 mg total) by mouth daily with breakfast.   busPIRone (BUSPAR) 15 MG tablet Take 45 mg by mouth in the morning.   busPIRone (BUSPAR) 30 MG tablet Take 1 tablet (30 mg total) by mouth at bedtime.   clonazePAM (KLONOPIN) 0.5 MG tablet Take 1 tablet (0.5 mg total) by mouth at bedtime.   conjugated estrogens (PREMARIN) vaginal cream Apply one pea-sized amount around the opening of the urethra daily for 2 weeks, then 3 times weekly moving forward.   folic acid (FOLVITE) 1 MG tablet Take 1 tablet (1 mg total) by mouth daily.   glipiZIDE (GLUCOTROL XL) 2.5 MG 24 hr tablet TAKE 1 TABLET (2.5 MG TOTAL) BY MOUTH DAILY WITH BREAKFAST. FOR DIABETES.   glucose blood test strip Use as instructed   memantine (NAMENDA) 5 MG tablet Take 1 tablet (5 mg total) by mouth 2 (two) times daily. For memory.   rosuvastatin (CRESTOR) 20 MG tablet TAKE 1 TABLET (20 MG TOTAL) BY MOUTH EVERY EVENING. FOR CHOLESTEROL.   tamsulosin (FLOMAX) 0.4 MG CAPS capsule Take 1 capsule (0.4 mg total) by mouth daily. For  urine retention   Tiotropium Bromide-Olodaterol (STIOLTO RESPIMAT) 2.5-2.5 MCG/ACT AERS INHALE 2 PUFFS BY MOUTH INTO THE LUNGS DAILY (Patient taking differently: Inhale 2 puffs into the lungs daily.)   No facility-administered encounter medications on file as of 10/20/2022.    Allergies (verified) Lexapro [escitalopram oxalate], Neosporin  [neomycin-polymyxin-gramicidin], and Neosporin [bacitracin-polymyxin b]   History: Past Medical History:  Diagnosis Date   Acute respiratory failure requiring reintubation ([place]) 11/05/2021   Anemia    Anxiety    Anxiety and depression    COPD (chronic obstructive pulmonary disease) (HCC)    CVA (cerebral vascular accident) ([place])     CVA (cerebral vascular accident) ([place])    L sided deficits   Dementia ([place])    Depression    Diabetes mellitus ([place])    Type 2   Diabetes mellitus without complication ([place])    Dysphagia    Dysphasia    Falls    GERD (gastroesophageal reflux disease)    History of lung cancer    Hyperlipidemia    Hypertension    Influenza A 12/13/2021   Memory disturbance 11/20/2017   Normal pressure hydrocephalus ([place]) 2018   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   Past Surgical History:  Procedure Laterality Date   HIP ARTHROPLASTY Right 09/07/2022   Procedure: ARTHROPLASTY BIPOLAR HIP (HEMIARTHROPLASTY);  Surgeon: Lovell Sheehan, MD;  Location: [facility] ORS;  Service: Orthopedics;  Laterality: Right;   IR FL GUIDED LOC OF NEEDLE/CATH TIP FOR SPINAL INJECTION RT  07/25/2022   LUNG REMOVAL, PARTIAL     left upper lobe   SHUNT REVISION VENTRICULAR-PERITONEAL N/A 09/19/2022   Procedure: VENTRICULOPERITONEAL SHUNT REVISION;  Surgeon: Meade Maw, MD;  Location: [facility] ORS;  Service: Neurosurgery;  Laterality: N/A;   VENTRICULOPERITONEAL SHUNT  12/09/2016   VENTRICULOPERITONEAL SHUNT  10/2018   Family History  Problem Relation Age of Onset   Pneumonia Mother    Cancer Father    Hyperlipidemia Sister    Hypertension Sister    Heart disease Sister    Diabetes Sister    Hypertension Sister    Diabetes Sister    Pancreatic cancer Sister    Throat cancer Brother    Cancer Brother    Cancer Brother    Breast cancer Neg Hx    Social History   Socioeconomic History   Marital status: Divorced    Spouse name: Not on file   Number of children: 1   Years of education: trade school   Highest education level: Not on file  Occupational History   Occupation: Retired   Tobacco Use   Smoking status: Former    Packs/day: 0.50    Years: 50.00    Total pack years: 25.00    Types: Cigarettes    Quit date: 08/01/2017    Years since quitting: 5.2   Smokeless tobacco: Never  Vaping Use   Vaping Use: Never used  Substance and Sexual Activity   Alcohol use: Not Currently   Drug use: Not Currently   Sexual activity: Not Currently  Other Topics Concern   Not on file  Social History Narrative   ** Merged History Encounter **       Moved from [place]. Lives with daughter. Right handed  Social Determinants of Health   Financial Resource Strain: Low Risk  (10/20/2022)   Overall Financial Resource Strain (CARDIA)    Difficulty of Paying Living Expenses: Not very hard  Food Insecurity: No Food Insecurity (10/20/2022)   Hunger Vital Sign    Worried About Running Out of Food in the Last Year: Never true    Ran Out of Food in the Last Year: Never true  Transportation Needs: No Transportation Needs (10/20/2022)   PRAPARE - Hydrologist (Medical): No    Lack of Transportation (Non-Medical): No  Physical Activity: Not on file  Stress: No Stress Concern Present (10/20/2022)   [place]    Feeling of Stress : Not at all  Social Connections: Not on file    Tobacco Counseling Counseling given: Not Answered   Clinical Intake:  Pre-visit preparation completed: Yes        Diabetes: Yes (Followed by PCP)  Nutrition Risk Assessment: Does the patient have any non-healing wounds?  No   Financial Strains and Diabetes Management: Are you having any financial strains with the device, your supplies or your medication? No .   How often do you need to have someone help you when you read instructions, pamphlets, or other written materials from your doctor or pharmacy?: 3 - Sometimes    Interpreter Needed?: No     Activities of Daily Living    10/20/2022    11:11 AM 09/19/2022    4:53 PM  In your present state of health, do you have any difficulty performing the following activities:  Hearing? 1   Vision? 0   Difficulty concentrating or making decisions? 1   Walking or climbing stairs? 1   Comment Rollator in use   Dressing or bathing? 1   Comment Some assistance   Doing errands, shopping? 1 0  Preparing Food and eating ? Y   Comment Daughter assist with meal prep. Self feeds.   Using the Toilet? N   In the past six months, have you accidently leaked urine? Y   Do you have problems with loss of bowel control? N   Managing your Medications? Y   Comment Daughter assist   Managing your Finances? Y   Comment Daughter assist   Housekeeping or managing your Housekeeping? Y   Comment Daughter assist     Patient Care Team: Pleas Koch, NP as PCP - General (Internal Medicine) [place] Dresser, MD as PCP - Cardiology (Cardiology) Laverle Hobby, MD as Consulting Physician (Pulmonary Disease) Monika Salk, MD as Referring Physician (Neurosurgery) Pleas Koch, NP (Internal Medicine)  Indicate any recent Medical Services you may have received from other than [facility] providers in the past year (date may be approximate).     Assessment:   This is a routine wellness examination for Amanda Porter.  I connected with  Amanda Porter on 10/20/22 by a audio enabled telemedicine application and verified that I am speaking with the correct person using two identifiers. Information also received from daughter, HIPAA compliant.   Patient Location: Home  Provider Location: Home Office  I discussed the limitations of evaluation and management by telemedicine. The patient expressed understanding and agreed to proceed.   Hearing/Vision screen Hearing Screening - Comments:: Patient has some difficulty hearing. Audiology testing completed. Does not wear hearing aids.  Vision Screening - Comments:: Followed by [facility] Wears corrective lenses     Dietary issues and exercise  activities discussed:    Eats a healthy diet. 3 meals daily. Small portions.  Ensure/Boost protein supplemental drink.  She tries to stay hydrated. Water intake 8-10 ounces daily. Nurse encourages increase of 24-32 ounces.   Goals Addressed             This Visit's Progress    DIET - INCREASE WATER INTAKE       Stay hydrated     Stay active         Depression Screen    10/20/2022   11:18 AM 06/24/2022    7:30 AM 03/14/2021    7:45 AM 03/26/2018    1:35 PM 03/02/2018   11:24 AM  PHQ 2/9 Scores  PHQ - 2 Score 0  6 6 6   PHQ- 9 Score   9 19 18   Exception Documentation  Patient refusal       Fall Risk    10/20/2022   11:13 AM 07/03/2021    3:16 PM 03/14/2021    7:45 AM 05/21/2018   11:39 AM 03/26/2018    1:35 PM  Fall Risk   Falls in the past year? 1 0 0 No No  Number falls in past yr: 1  0    Injury with Fall?   0    Risk for fall due to :   Impaired balance/gait    Follow up Falls prevention discussed;Falls evaluation completed        FALL RISK PREVENTION PERTAINING TO THE HOME: Home free of loose throw rugs in walkways, pet beds, electrical cords, etc? Yes  Adequate lighting in your home to reduce risk of falls? Yes   ASSISTIVE DEVICES UTILIZED TO PREVENT FALLS: Life alert? Yes  in process Use of a cane, walker or w/c? Yes , rollator walker in use  TIMED UP AND GO: Was the test performed? No .   Cognitive Function: Patient is alert.  Followed by Neurology in process.  MMSE/6CIT declined at this time.  Taking Namenda ad directed.      07/03/2021    3:17 PM 07/02/2020    2:03 PM 05/24/2019    7:47 AM 07/30/2018   12:00 PM 11/20/2017    9:28 AM  MMSE - Mini Mental State Exam  Orientation to time 3 4 4 5 5   Orientation to Place 4 3 4 4 5   Registration 3 3 3 3 3   Attention/ Calculation 1 5 2 2 5   Recall 1 2 2 2 1   Language- name 2 objects 2 2 2 2 2   Language- repeat 1 1 1 1 1   Language-  follow 3 step command 3 3 3 3 3   Language- read & follow direction 1 1 1 1 1   Write a sentence 1 1 1 1 1   Copy design 0 0 0 1 0  Total score 20 25 23 25 27         Immunizations Immunization History  Administered Date(s) Administered   Fluad Quad(high Dose 65+) 12/13/2020, 11/14/2021   Influenza, High Dose Seasonal PF 09/13/2018, 09/25/2019, 11/07/2019   Influenza-Unspecified 08/15/2016, 09/06/2018   PFIZER(Purple Top)SARS-COV-2 Vaccination 01/18/2020, 02/20/2020, 11/02/2020   Pneumococcal Conjugate-13 11/02/2013, 09/25/2019, 11/07/2019   Pneumococcal Polysaccharide-23 08/09/2008   Tdap 08/09/2008, 09/25/2019     Screening Tests Health Maintenance  Topic Date Due   FOOT EXAM  08/28/2019   Diabetic kidney evaluation - Urine ACR  01/16/2021   OPHTHALMOLOGY EXAM  09/24/2022   HEMOGLOBIN A1C  10/05/2022   Zoster Vaccines- Shingrix (1 of  2) 11/17/2022 (Originally 03/02/1995)   Pneumonia Vaccine 63+ Years old (40 - PPSV23 or PCV20) 02/16/2023 (Originally 11/06/2020)   COVID-19 Vaccine (4 - Pfizer series) 02/16/2023 (Originally 12/28/2020)   INFLUENZA VACCINE  03/01/2023 (Originally 07/01/2022)   Lung Cancer Screening  09/07/2023   Diabetic kidney evaluation - GFR measurement  10/14/2023   Medicare Annual Wellness (AWV)  10/21/2023   DEXA SCAN  Completed   Hepatitis C Screening  Completed   HPV VACCINES  Aged Out   COLONOSCOPY (Pts 45-18yrs Insurance coverage will need to be confirmed)  Discontinued   Health Maintenance Health Maintenance Due  Topic Date Due   FOOT EXAM  08/28/2019   Diabetic kidney evaluation - Urine ACR  01/16/2021   OPHTHALMOLOGY EXAM  09/24/2022   HEMOGLOBIN A1C  10/05/2022   DG Chest 2 View: completed 12/13/21.   Hepatitis C Screening: Completed 2021  Vision Screening: Recommended annual ophthalmology exams for early detection of glaucoma and other disorders of the eye.  Dental Screening: Recommended annual dental exams for proper oral hygiene. Denture.    Community Resource Referral / Chronic Care Management: CRR required this visit?  No   CCM required this visit?  No      Plan:     I have personally reviewed and noted the following in the patient's chart:   Medical and social history Use of alcohol, tobacco or illicit drugs  Current medications and supplements including opioid prescriptions. Patient is not currently taking opioid prescriptions. Functional ability and status Nutritional status Physical activity Advanced directives List of other physicians Hospitalizations, surgeries, and ER visits in previous 12 months Vitals Screenings to include cognitive, depression, and falls Referrals and appointments  In addition, I have reviewed and discussed with patient certain preventive protocols, quality metrics, and best practice recommendations. A written personalized care plan for preventive services as well as general preventive health recommendations were provided to patient.     Leta Jungling, LPN   [id number]

## 2022-10-27 ENCOUNTER — Telehealth: Payer: Self-pay | Admitting: Primary Care

## 2022-10-27 NOTE — Telephone Encounter (Signed)
Approved.  

## 2022-10-27 NOTE — Telephone Encounter (Signed)
Home Health verbal orders Caller Name: [place] [facility]   Callback number: [phone]  Requesting OT/PT/Skilled nursing/Social Work/Speech: OT   Reason:  Frequency: once a week for five weeks , skip a week, Once a week for one week   Please forward to [facility] pool or providers CMA

## 2022-10-28 NOTE — Telephone Encounter (Signed)
Notified Colletta [place] of approval of verbal orders

## 2022-10-30 ENCOUNTER — Encounter: Payer: Self-pay | Admitting: Neurosurgery

## 2022-10-30 ENCOUNTER — Ambulatory Visit (INDEPENDENT_AMBULATORY_CARE_PROVIDER_SITE_OTHER): Payer: Medicare PPO | Admitting: Neurosurgery

## 2022-10-30 VITALS — BP 132/72 | Temp 98.7°F | Ht 62.0 in | Wt 99.0 lb

## 2022-10-30 DIAGNOSIS — F0390 Unspecified dementia without behavioral disturbance: Secondary | ICD-10-CM

## 2022-10-30 DIAGNOSIS — T85618D Breakdown (mechanical) of other specified internal prosthetic devices, implants and grafts, subsequent encounter: Secondary | ICD-10-CM

## 2022-10-30 DIAGNOSIS — G912 (Idiopathic) normal pressure hydrocephalus: Secondary | ICD-10-CM

## 2022-10-30 NOTE — Progress Notes (Signed)
   REFERRING PHYSICIAN:  Pleas Koch, [place],  [place] [address]  DOS: 09/19/22  Removal and replacement of ventriculoperitoneal shunt   HISTORY OF PRESENT ILLNESS: Fedora A Faddis is status post Removal and replacement of ventriculoperitoneal shunt.   Is better than she was prior to surgery.  She has had a couple of frontal headaches.  Her cognition is better.  Her mobility is better.  Her continence is also better.    PHYSICAL EXAMINATION:  NEUROLOGICAL:  General: In no acute distress.   Awake and alert. Knows her name. States she is at clinic and that it is October 2003. Pupils equal round and reactive to light.  Facial tone is symmetric.    Strength: Side Biceps Triceps Deltoid Interossei Grip Wrist Ext. Wrist Flex.  R 5 5 5 5 5 5 5   L 5 5 5 5 5 5 5    Incision c/d/I, staples have been removed.  Abdominal incision is healing well. No signs of erythema or infection noted.   Imaging:  Nothing new to review.   Assessment / Plan: Dorinne A Lisowski is doing well s/p above surgery.  If she continues to have headaches, we will get a CT scan before her next appointment.  I will see her back in 6 weeks.  Her daughter requested referral for neurology.  I will place that referral for her.    Meade Maw MD Dept of Neurosurgery

## 2022-10-31 ENCOUNTER — Ambulatory Visit: Payer: Medicare PPO | Admitting: Physician Assistant

## 2022-11-02 ENCOUNTER — Other Ambulatory Visit (HOSPITAL_COMMUNITY): Payer: Self-pay | Admitting: Psychiatry

## 2022-11-03 ENCOUNTER — Telehealth: Payer: Self-pay | Admitting: Primary Care

## 2022-11-03 NOTE — Telephone Encounter (Signed)
Home Health verbal orders [place] Name: [facility] number: [phone]  Requesting OT/PT/Skilled nursing/Social Work/Speech:Social work  Reason:  Frequency:1 time for next week, Can leave a voice with the secure line  Please forward to [facility] pool or providers CMA

## 2022-11-03 NOTE — Telephone Encounter (Signed)
Approved.  

## 2022-11-04 NOTE — Telephone Encounter (Signed)
Left secure voicemail with Marissa of approval of verbal orders. Will call back with any questions.

## 2022-11-05 ENCOUNTER — Telehealth: Payer: Self-pay

## 2022-11-05 DIAGNOSIS — F32A Depression, unspecified: Secondary | ICD-10-CM

## 2022-11-05 NOTE — Telephone Encounter (Signed)
Noted. Referral placed. Let the daughter know to be looking out for a letter on her MyChart portal or a phone call regarding scheduling.

## 2022-11-05 NOTE — Addendum Note (Signed)
Addended by: Pleas Koch on: 11/05/2022 05:16 PM   Modules accepted: Orders

## 2022-11-05 NOTE — Telephone Encounter (Signed)
Patients daughter called in requesting a referral to a new Psychiatrist for her mom. The current office she is [facility] has not been returning her phone calls for a couple weeks and they are overall not happy with their care there. She is fine going to [place] or [place].

## 2022-11-06 NOTE — Telephone Encounter (Signed)
Patients daughter notified. She will look for a letter or phone call to schedule.

## 2022-11-10 ENCOUNTER — Telehealth: Payer: Self-pay | Admitting: Primary Care

## 2022-11-10 ENCOUNTER — Emergency Department: Payer: Medicare PPO

## 2022-11-10 ENCOUNTER — Emergency Department
Admission: EM | Admit: 2022-11-10 | Discharge: 2022-11-10 | Disposition: A | Discharge status: Home / Self Care | Payer: Medicare PPO | Attending: Emergency Medicine | Admitting: Emergency Medicine

## 2022-11-10 ENCOUNTER — Other Ambulatory Visit: Payer: Self-pay

## 2022-11-10 DIAGNOSIS — N390 Urinary tract infection, site not specified: Secondary | ICD-10-CM | POA: Insufficient documentation

## 2022-11-10 DIAGNOSIS — E1165 Type 2 diabetes mellitus with hyperglycemia: Secondary | ICD-10-CM | POA: Diagnosis not present

## 2022-11-10 DIAGNOSIS — R739 Hyperglycemia, unspecified: Secondary | ICD-10-CM | POA: Diagnosis present

## 2022-11-10 DIAGNOSIS — I10 Essential (primary) hypertension: Secondary | ICD-10-CM | POA: Diagnosis not present

## 2022-11-10 DIAGNOSIS — Z1152 Encounter for screening for COVID-19: Secondary | ICD-10-CM | POA: Diagnosis not present

## 2022-11-10 DIAGNOSIS — F0390 Unspecified dementia without behavioral disturbance: Secondary | ICD-10-CM | POA: Insufficient documentation

## 2022-11-10 DIAGNOSIS — J449 Chronic obstructive pulmonary disease, unspecified: Secondary | ICD-10-CM | POA: Diagnosis not present

## 2022-11-10 DIAGNOSIS — R410 Disorientation, unspecified: Secondary | ICD-10-CM | POA: Diagnosis not present

## 2022-11-10 DIAGNOSIS — F32A Depression, unspecified: Secondary | ICD-10-CM

## 2022-11-10 LAB — COMPREHENSIVE METABOLIC PANEL
ALT: 12 U/L (ref 0–44)
AST: 16 U/L (ref 15–41)
Albumin: 3.7 g/dL (ref 3.5–5.0)
Alkaline Phosphatase: 73 U/L (ref 38–126)
Anion gap: 7 (ref 5–15)
BUN: 20 mg/dL (ref 8–23)
CO2: 31 mmol/L (ref 22–32)
Calcium: 9.3 mg/dL (ref 8.9–10.3)
Chloride: 103 mmol/L (ref 98–111)
Creatinine, Ser: 0.64 mg/dL (ref 0.44–1.00)
GFR, Estimated: >60 mL/min (ref >60)
Glucose, Bld: 173 mg/dL — ABNORMAL HIGH (ref 70–99)
Potassium: 3 mmol/L — ABNORMAL LOW (ref 3.5–5.1)
Sodium: 141 mmol/L (ref 135–145)
Total Bilirubin: 0.2 mg/dL — ABNORMAL LOW (ref 0.3–1.2)
Total Protein: 7.8 g/dL (ref 6.5–8.1)

## 2022-11-10 LAB — CBG MONITORING, ED
Glucose-Capillary: 145 mg/dL — ABNORMAL HIGH (ref 70–99)
Glucose-Capillary: 169 mg/dL — ABNORMAL HIGH (ref 70–99)
Glucose-Capillary: 99 mg/dL (ref 70–99)

## 2022-11-10 LAB — URINALYSIS, ROUTINE W REFLEX MICROSCOPIC
Bilirubin Urine: NEGATIVE
Glucose, UA: 150 mg/dL — AB
Hgb urine dipstick: NEGATIVE
Ketones, ur: NEGATIVE mg/dL
Nitrite: NEGATIVE
Protein, ur: 30 mg/dL — AB
Specific Gravity, Urine: 1.026 (ref 1.005–1.030)
pH: 5 (ref 5.0–8.0)

## 2022-11-10 LAB — CBC WITH DIFFERENTIAL/PLATELET
Abs Immature Granulocytes: 0.01 10*3/uL (ref 0.00–0.07)
Basophils Absolute: 0.1 10*3/uL (ref 0.0–0.1)
Basophils Relative: 1 %
Eosinophils Absolute: 0.1 10*3/uL (ref 0.0–0.5)
Eosinophils Relative: 1 %
HCT: 32 % — ABNORMAL LOW (ref 36.0–46.0)
Hemoglobin: 9.4 g/dL — ABNORMAL LOW (ref 12.0–15.0)
Immature Granulocytes: 0 %
Lymphocytes Relative: 30 %
Lymphs Abs: 2.8 10*3/uL (ref 0.7–4.0)
MCH: 26.5 pg (ref 26.0–34.0)
MCHC: 29.4 g/dL — ABNORMAL LOW (ref 30.0–36.0)
MCV: 90.1 fL (ref 80.0–100.0)
Monocytes Absolute: 0.8 10*3/uL (ref 0.1–1.0)
Monocytes Relative: 9 %
Neutro Abs: 5.4 10*3/uL (ref 1.7–7.7)
Neutrophils Relative %: 59 %
Platelets: 336 10*3/uL (ref 150–400)
RBC: 3.55 MIL/uL — ABNORMAL LOW (ref 3.87–5.11)
RDW: 14.5 % (ref 11.5–15.5)
WBC: 9.2 10*3/uL (ref 4.0–10.5)
nRBC: 0 % (ref 0.0–0.2)

## 2022-11-10 LAB — BLOOD GAS, VENOUS
Acid-Base Excess: 8.8 mmol/L — ABNORMAL HIGH (ref 0.0–2.0)
Bicarbonate: 34.6 mmol/L — ABNORMAL HIGH (ref 20.0–28.0)
O2 Saturation: 72.5 %
Patient temperature: 37
pCO2, Ven: 51 mmHg (ref 44–60)
pH, Ven: 7.44 — ABNORMAL HIGH (ref 7.25–7.43)
pO2, Ven: 46 mmHg — ABNORMAL HIGH (ref 32–45)

## 2022-11-10 LAB — RESP PANEL BY RT-PCR (RSV, FLU A&B, COVID)  RVPGX2
Influenza A by PCR: NEGATIVE
Influenza B by PCR: NEGATIVE
Resp Syncytial Virus by PCR: NEGATIVE
SARS Coronavirus 2 by RT PCR: NEGATIVE

## 2022-11-10 LAB — BETA-HYDROXYBUTYRIC ACID: Beta-Hydroxybutyric Acid: 0.06 mmol/L (ref 0.05–0.27)

## 2022-11-10 LAB — TROPONIN I (HIGH SENSITIVITY)
Troponin I (High Sensitivity): 8 ng/L (ref <18)
Troponin I (High Sensitivity): 9 ng/L (ref <18)

## 2022-11-10 LAB — OSMOLALITY: Osmolality: 302 mOsm/kg — ABNORMAL HIGH (ref 275–295)

## 2022-11-10 LAB — LACTIC ACID, PLASMA: Lactic Acid, Venous: 1.1 mmol/L (ref 0.5–1.9)

## 2022-11-10 MED ORDER — LEVOFLOXACIN 750 MG PO TABS: 750.0000 mg | ORAL_TABLET | Freq: Every day | ORAL | 0 refills | Status: AC

## 2022-11-10 MED ORDER — BUPROPION HCL ER (XL) 300 MG PO TB24: 300.0000 mg | ORAL_TABLET | Freq: Every day | ORAL | 0 refills | Status: DC

## 2022-11-10 MED ORDER — SODIUM CHLORIDE 0.9 % IV BOLUS
1000.0000 mL | Freq: Once | INTRAVENOUS | Status: AC
  Administered 2022-11-10: 1000 mL via INTRAVENOUS

## 2022-11-10 MED ORDER — LEVOFLOXACIN 750 MG PO TABS
750.0000 mg | ORAL_TABLET | Freq: Once | ORAL | Status: AC
  Administered 2022-11-10: 750 mg via ORAL
  Filled 2022-11-10: qty 1

## 2022-11-10 NOTE — ED Provider Notes (Signed)
[facility] Provider Note    Event Date/Time   First MD Initiated Contact with Patient 11/10/22 2143     (approximate)   History   Fall and Hyperglycemia   HPI  Amanda Porter is a 76 y.o. adult who presents to the emergency department with hyperglycemia.  Patient has a history of dementia.  History is provided by the patient's daughter who is on the telephone.  Over the past couple of days patient has had increased fatigue and complaining of symptoms of urinary tract infection.  History of urinary tract infections in the past secondary to her VP shunt and difficulties with hydrocephalus and neurogenic bladder.  Today had elevated glucose levels and was concerned that there was a urinary tract infection so they brought her to the emergency department.  Normal state of health since that time.  No falls or trauma.  Follow-up appointment with urologist next week.     Physical Exam   Triage Vital Signs: ED Triage Vitals  Enc Vitals Group     BP 11/10/22 1610 126/67     Pulse Rate 11/10/22 1610 88     Resp 11/10/22 1610 18     Temp 11/10/22 1610 98.9 F (37.2 C)     Temp Source 11/10/22 1610 Oral     SpO2 11/10/22 1610 100 %     Weight 11/10/22 1609 99 lb (44.9 kg)     Height 11/10/22 1609 5\' 2"  (1.575 m)     Head Circumference --      Peak Flow --      Pain Score --      Pain Loc --      Pain Edu? --      Excl. in [place]? --     Most recent vital signs: Vitals:   11/10/22 2330 11/10/22 2343  BP:  (!) 143/68  Pulse: 88 88  Resp:  16  Temp:  98.9 F (37.2 C)  SpO2: 100% 100%    Physical Exam Constitutional:      Appearance: Amanda Porter "[name]" is well-developed.  HENT:     Head: Atraumatic.  Eyes:     Conjunctiva/sclera: Conjunctivae normal.  Cardiovascular:     Rate and Rhythm: Regular rhythm.  Pulmonary:     Effort: No respiratory distress.  Abdominal:     General: There is no distension.  Musculoskeletal:     Cervical back: Normal  range of motion.  Skin:    General: Skin is warm.  Neurological:     Mental Status: Amanda Porter "[name]" is alert. Mental status is at baseline.     IMPRESSION / MDM / ASSESSMENT AND PLAN / ED COURSE  I reviewed the triage vital signs and the nursing notes.  Differential diagnosis includes intracranial hemorrhage, electrolyte abnormality, urinary tract infection  EKG  I, Nathaniel Man, the attending physician, personally viewed and interpreted this ECG.   Rate: Normal  Rhythm: Normal sinus  Axis: Normal  Intervals: Normal  ST&T Change: None  No tachycardic or bradycardic dysrhythmias while on cardiac telemetry.  RADIOLOGY I independently reviewed imaging, my interpretation of imaging: Chest x-ray with no signs of pneumonia.  Read as COPD with increased interstitial markings.  Labs (all labs ordered are listed, but only abnormal results are displayed) Labs interpreted as -  Signs of urinary tract infection.  No significant anemia or leukocytosis.  Mildly low potassium at 3.0.  Labs Reviewed  COMPREHENSIVE METABOLIC PANEL - Abnormal; Notable for the  following components:      Result Value   Potassium 3.0 (*)    Glucose, Bld 173 (*)    Total Bilirubin 0.2 (*)    All other components within normal limits  CBC WITH DIFFERENTIAL/PLATELET - Abnormal; Notable for the following components:   RBC 3.55 (*)    Hemoglobin 9.4 (*)    HCT 32.0 (*)    MCHC 29.4 (*)    All other components within normal limits  URINALYSIS, ROUTINE W REFLEX MICROSCOPIC - Abnormal; Notable for the following components:   Color, Urine YELLOW (*)    APPearance HAZY (*)    Glucose, UA 150 (*)    Protein, ur 30 (*)    Leukocytes,Ua TRACE (*)    Bacteria, UA MANY (*)    All other components within normal limits  OSMOLALITY - Abnormal; Notable for the following components:   Osmolality 302 (*)    All other components within normal limits  BLOOD GAS, VENOUS - Abnormal; Notable for the following  components:   pH, Ven 7.44 (*)    pO2, Ven 46 (*)    Bicarbonate 34.6 (*)    Acid-Base Excess 8.8 (*)    All other components within normal limits  CBG MONITORING, ED - Abnormal; Notable for the following components:   Glucose-Capillary 169 (*)    All other components within normal limits  CBG MONITORING, ED - Abnormal; Notable for the following components:   Glucose-Capillary 145 (*)    All other components within normal limits  RESP PANEL BY RT-PCR (RSV, FLU A&B, COVID)  RVPGX2  URINE CULTURE  LACTIC ACID, PLASMA  BETA-HYDROXYBUTYRIC ACID  CBG MONITORING, ED  TROPONIN I (HIGH SENSITIVITY)  TROPONIN I (HIGH SENSITIVITY)    On chart review urine cultures in the past has been +3 times since 2022.  Multiple different urine culture results.  The last 1 that I can see was sensitive to fluoroquinolones.  According to daughter on the phone has had difficulties with cephalosporins in the past.  Urine culture was added on.  Given Porter dose of p.o. fluoroquinolone and the emergency department with levofloxacin.  Will start the patient on levofloxacin.  Discussed close follow-up with the daughter over the phone with primary care physician and given return precautions for any worsening symptoms.  Glucose improved while being in the emergency department.  Tolerating p.o.     PROCEDURES:  Critical Care performed: No  Procedures  Patient's presentation is most consistent with acute presentation with potential threat to life or bodily function.   MEDICATIONS ORDERED IN ED: Medications  sodium chloride 0.9 % bolus 1,000 mL (0 mLs Intravenous Stopped 11/10/22 1900)  levofloxacin (LEVAQUIN) tablet 750 mg (750 mg Oral Given 11/10/22 2337)    FINAL CLINICAL IMPRESSION(S) / ED DIAGNOSES   Final diagnoses:  Urinary tract infection without hematuria, site unspecified     Rx / DC Orders   ED Discharge Orders          Ordered    potassium chloride SA (KLOR-CON M) 20 MEQ tablet  Daily         11/11/22 0052    levofloxacin (LEVAQUIN) 750 MG tablet  Daily        11/10/22 2310             Note:  This document was prepared using Dragon voice recognition software and may include unintentional dictation errors.   Nathaniel Man, MD 11/11/22 [phone]

## 2022-11-10 NOTE — ED Provider Triage Note (Signed)
Emergency Medicine Provider Triage Evaluation Note  Amanda Porter , a 76 y.o. adult  was evaluated in triage.  Pt complains of hyperglycemia, fall, possible UTI.  Patient presents with her daughter.  Glucose up to 500 today, type II diabetic.  No history of DKA.  Patient has had UTI-like symptoms, has had recurrent UTIs and has an appointment with urology in 2 days but symptoms are worsening.  No reported fevers.  No emesis, cough.  Wears O2 chronically, no change in O2 consumption  Review of Systems  Positive: Weakness, fall, hyperglycemia Negative: Injury from fall, headache, chest pain, increased shortness of breath  Physical Exam  There were no vitals taken for this visit. Gen:   Awake, no distress   Resp:  Normal effort  MSK:   Moves extremities without difficulty  Other:  Patient currently on oxygen, no increased work of breathing  Medical Decision Making  Medically screening exam initiated at 4:08 PM.  Appropriate orders placed.  Amanda Porter was informed that the remainder of the evaluation will be completed by another provider, this initial triage assessment does not replace that evaluation, and the importance of remaining in the ED until their evaluation is complete.  Patient presents with likely UTI, hyperglycemia.  Patient will have labs, urinalysis, urine culture, chest x-ray, EKG at this time   Darletta Moll, PA-C 11/10/22 1615

## 2022-11-10 NOTE — Telephone Encounter (Signed)
Noted. Will await ED notes.

## 2022-11-10 NOTE — Telephone Encounter (Signed)
Tillie Rung from [facility] [facility] called to update Amanda Porter on pt. Tillie Rung stated the pt had a fall this morning, 11/10/22, there was no injuries. Tillie Rung states pt has signs of UTI & her blood sugar was leveled at "488", pt is being taken to ER. Call back # [phone]

## 2022-11-10 NOTE — ED Triage Notes (Addendum)
Daughter reports patient is having UTI symptoms and having high blood sugars.  Reports she had a fall this am.  CBG close to 500 this morning.  Denies injury.,   Patient on 3L [place] all the time.  Daughter reports hx of neurogenic bladder.  Reports now having foul urine and burning with urination.

## 2022-11-11 MED ORDER — POTASSIUM CHLORIDE CRYS ER 20 MEQ PO TBCR: 20.0000 meq | EXTENDED_RELEASE_TABLET | Freq: Every day | ORAL | 0 refills | Status: DC

## 2022-11-12 ENCOUNTER — Encounter: Payer: Self-pay | Admitting: Physician Assistant

## 2022-11-12 ENCOUNTER — Ambulatory Visit (INDEPENDENT_AMBULATORY_CARE_PROVIDER_SITE_OTHER): Payer: Medicare PPO | Admitting: Physician Assistant

## 2022-11-12 VITALS — BP 124/58 | HR 97 | Ht 62.0 in | Wt 100.0 lb

## 2022-11-12 DIAGNOSIS — N390 Urinary tract infection, site not specified: Secondary | ICD-10-CM | POA: Diagnosis not present

## 2022-11-12 DIAGNOSIS — N3941 Urge incontinence: Secondary | ICD-10-CM

## 2022-11-12 DIAGNOSIS — R339 Retention of urine, unspecified: Secondary | ICD-10-CM | POA: Diagnosis not present

## 2022-11-12 LAB — BLADDER SCAN AMB NON-IMAGING

## 2022-11-12 LAB — URINE CULTURE: Culture: >=100000 — AB

## 2022-11-12 NOTE — Progress Notes (Unsigned)
11/12/2022 3:55 PM   Amanda Porter October 11, 1946 [id number]  CC: Chief Complaint  Patient presents with   Follow-up    1 mth follow-up for incomplete bladder emptying   HPI: Amanda Porter is a 76 y.o. female with PMH dementia, DM 2, CVA, intermittent difficulty voiding, possible recurrent UTI, and normal pressure hydrocephalus with recent VP shunt malfunction now s/p shunt revision with Dr. Izora Porter who presents today for follow-up and symptom recheck.  She is accompanied today by her daughter, Amanda Porter, who contributes to HPI.  She was seen in the emergency department 2 days ago for evaluation of a fall and concerns for hyperglycemia on her home glucometer.  She was also having some dysuria concerning for UTI.  UA was notable for pyuria, microscopic hematuria, and bacteriuria.  She was started on empiric Levaquin for UTI and her urine culture has since finalized with Aerococcus urinae.  Today she reports she stopped Flomax since her last clinic visit and is doing fine without it.  She has no bothersome urinary symptoms at the moment and reports she is feeling better on Levaquin.  On average she has 1 episode of urinary leakage overnight but is not terribly bothered by this.  Catheterized residual 30 mL.  PMH: Past Medical History:  Diagnosis Date   Acute respiratory failure requiring reintubation ([place]) 11/05/2021   Anemia    Anxiety    Anxiety and depression    COPD (chronic obstructive pulmonary disease) ([facility])    CVA (cerebral vascular accident) ([place])    CVA (cerebral vascular accident) ([place])    L sided deficits   Dementia ([place])    Depression    Diabetes mellitus ([place])    Type 2   Diabetes mellitus without complication ([place])    Dysphagia    Dysphasia    Falls    GERD (gastroesophageal reflux disease)    History of lung cancer    Hyperlipidemia    Hypertension    Influenza A 12/13/2021   Memory disturbance 11/20/2017   Normal pressure hydrocephalus ([place]) 2018   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    Surgical History: Past Surgical History:  Procedure Laterality Date   HIP ARTHROPLASTY Right 09/07/2022   Procedure: ARTHROPLASTY BIPOLAR HIP (HEMIARTHROPLASTY);  Surgeon: Lovell Sheehan, MD;  Location: [facility] ORS;  Service: Orthopedics;  Laterality: Right;   IR FL GUIDED LOC OF NEEDLE/CATH TIP FOR SPINAL INJECTION RT  07/25/2022   LUNG REMOVAL, PARTIAL     left upper lobe   SHUNT REVISION VENTRICULAR-PERITONEAL N/A 09/19/2022   Procedure: VENTRICULOPERITONEAL SHUNT REVISION;  Surgeon: Meade Maw, MD;  Location: [facility] ORS;  Service: Neurosurgery;  Laterality: N/A;   VENTRICULOPERITONEAL SHUNT  12/09/2016   VENTRICULOPERITONEAL SHUNT  10/2018    Home Medications:  Allergies as of 11/12/2022       Reactions   Lexapro [escitalopram Oxalate] Other (See Comments)   Dizziness, nausea, fatigue   Neosporin  [neomycin-polymyxin-gramicidin] Rash   (Neosporin)   Neosporin [bacitracin-polymyxin B] Rash        Medication List        Accurate as of November 12, 2022  3:55 PM. If you have any questions, ask your nurse or doctor.  STOP taking these medications    tamsulosin 0.4 MG Caps capsule Commonly known as: FLOMAX Stopped by: Debroah Loop, PA-C       TAKE these medications    Accu-Chek Softclix Lancets lancets Use as instructed   Albuterol Sulfate 2.5 MG/0.5ML Nebu Inhale 2.5 mg into the lungs every 6 (six) hours as needed.   albuterol 108 (90 Base) MCG/ACT inhaler Commonly known as: VENTOLIN HFA INHALE 2 PUFFS BY MOUTH EVERY 4 HOURS AS NEEDED   amLODipine 2.5 MG tablet Commonly known as: NORVASC Take 1 tablet (2.5 mg total) by mouth daily. for blood pressure.   aspirin 81 MG  chewable tablet Chew 1 tablet (81 mg total) by mouth daily.   buPROPion 300 MG 24 hr tablet Commonly known as: WELLBUTRIN XL Take 1 tablet (300 mg total) by mouth daily with breakfast. for anxiety and depression.   busPIRone 15 MG tablet Commonly known as: BUSPAR Take 45 mg by mouth in the morning.   clonazePAM 0.5 MG tablet Commonly known as: KLONOPIN Take 1 tablet (0.5 mg total) by mouth at bedtime.   folic acid 1 MG tablet Commonly known as: FOLVITE Take 1 tablet (1 mg total) by mouth daily.   glipiZIDE 2.5 MG 24 hr tablet Commonly known as: GLUCOTROL XL TAKE 1 TABLET (2.5 MG TOTAL) BY MOUTH DAILY WITH BREAKFAST. FOR DIABETES.   glucose blood test strip Use as instructed   levofloxacin 750 MG tablet Commonly known as: Levaquin Take 1 tablet (750 mg total) by mouth daily for 5 days.   memantine 5 MG tablet Commonly known as: NAMENDA Take 1 tablet (5 mg total) by mouth 2 (two) times daily. For memory.   potassium chloride SA 20 MEQ tablet Commonly known as: KLOR-CON M Take 1 tablet (20 mEq total) by mouth daily for 3 days.   Premarin vaginal cream Generic drug: conjugated estrogens Apply one pea-sized amount around the opening of the urethra daily for 2 weeks, then 3 times weekly moving forward.   rosuvastatin 20 MG tablet Commonly known as: CRESTOR TAKE 1 TABLET (20 MG TOTAL) BY MOUTH EVERY EVENING. FOR CHOLESTEROL.   Stiolto Respimat 2.5-2.5 MCG/ACT Aers Generic drug: Tiotropium Bromide-Olodaterol INHALE 2 PUFFS BY MOUTH INTO THE LUNGS DAILY        Allergies:  Allergies  Allergen Reactions   Lexapro [Escitalopram Oxalate] Other (See Comments)    Dizziness, nausea, fatigue   Neosporin  [Neomycin-Polymyxin-Gramicidin] Rash    (Neosporin)   Neosporin [Bacitracin-Polymyxin B] Rash    Family History: Family History  Problem Relation Age of Onset   Pneumonia Mother    Cancer Father    Hyperlipidemia Sister    Hypertension Sister    Heart disease  Sister    Diabetes Sister    Hypertension Sister    Diabetes Sister    Pancreatic cancer Sister    Throat cancer Brother    Cancer Brother    Cancer Brother    Breast cancer Neg Hx     Social History:   reports that Amanda Porter "[name]" quit smoking about 5 years ago. Amanda A. Stahlman "[name]"'s smoking use included cigarettes. Skyelynn A. Abbasi "Marlou Sa" has a 25.00 pack-year smoking history. Lynesha A. Vasconcelos "Marlou Sa" has been exposed to tobacco smoke. Alya A. Erkkila "Marlou Sa" has never used smokeless tobacco. Fiana A. Fickel "Marlou Sa" reports that Tiffiney A. Burmaster "Marlou Sa" does not currently use alcohol. Vickki A. Shackleford "Marlou Sa" reports that Kadijah A. Arnott "Marlou Sa" does not currently use drugs.  Physical Exam:  BP (!) 124/58   Pulse 97   Ht 5\' 2"  (1.575 m)   Wt 100 lb (45.4 kg)   BMI 18.29 kg/m   Constitutional:  Alert and oriented, no acute distress, nontoxic appearing HEENT: [place], AT Cardiovascular: No clubbing, cyanosis, or edema Respiratory: Normal respiratory effort, no increased work of breathing Skin: No rashes, bruises or suspicious lesions Neurologic: Grossly intact, no focal deficits, moving all 4 extremities Psychiatric: Normal mood and affect  Laboratory Data: Results for orders placed or performed in visit on 11/12/22  BLADDER SCAN AMB NON-IMAGING  Result Value Ref Range   Scan Result 72ml    Assessment & Plan:   1. Recurrent UTI Symptomatic improvement on Levaquin with urine culture growing Aerococcus species.  I counseled her to complete the antibiotic as prescribed.  Also encouraged her to continue vaginal estrogen cream.  2. Urge incontinence of urine Mild symptoms with minimal bother.  I offered her pharmacotherapy but she declined.  Will continue to monitor.  3. Incomplete bladder emptying PVR WNL today.  She is doing well off Flomax, will plan to keep her off this.  Will see her back in 6 months for follow-up, sooner if needed. - BLADDER SCAN AMB NON-IMAGING  Return in about 6  months (around ) for OAB, rUTI f/u with PVR.  Debroah Loop, PA-C  [facility] [address], [place], [place] [address] [phone]

## 2022-11-12 NOTE — Patient Instructions (Signed)
Stay off Flomax (tamsulosin). Continue Premarin estrogen cream 3 times weekly (Monday, Wednesday, Friday). Complete your Levaquin antibiotic as prescribed.

## 2022-11-19 ENCOUNTER — Telehealth: Payer: Self-pay | Admitting: Primary Care

## 2022-11-19 NOTE — Telephone Encounter (Signed)
Stephanie from [facility] called in to report that patient visit for this week is going to be rescheduled for next week due to patient being out of town.

## 2022-11-19 NOTE — Telephone Encounter (Signed)
Noted  

## 2022-11-20 ENCOUNTER — Telehealth: Payer: Self-pay | Admitting: Primary Care

## 2022-11-20 ENCOUNTER — Other Ambulatory Visit: Payer: Self-pay | Admitting: Physician Assistant

## 2022-11-20 DIAGNOSIS — N390 Urinary tract infection, site not specified: Secondary | ICD-10-CM

## 2022-11-20 MED ORDER — NITROFURANTOIN MONOHYD MACRO 100 MG PO CAPS: 100.0000 mg | ORAL_CAPSULE | Freq: Two times a day (BID) | ORAL | 0 refills | Status: AC

## 2022-11-20 NOTE — Telephone Encounter (Signed)
Unable to reach patient. Left voicemail to return call to our office.   

## 2022-11-20 NOTE — Telephone Encounter (Signed)
Marissa from [facility] called back and spoke with her and advised her that orders were approved.

## 2022-11-20 NOTE — Telephone Encounter (Signed)
Approved.  

## 2022-11-20 NOTE — Telephone Encounter (Signed)
Home Health verbal orders [place] Name: [facility] number: [phone]  Requesting OT/PT/Skilled nursing/Social Work/Speech:Social Work  Reason:  Frequency:Subsequent visit 11/21/2022  Please forward to [facility] pool or providers CMA

## 2022-11-26 ENCOUNTER — Ambulatory Visit
Admission: RE | Admit: 2022-11-26 | Discharge: 2022-11-26 | Disposition: A | Discharge status: Home / Self Care | Payer: Medicare PPO | Source: Ambulatory Visit | Attending: Internal Medicine | Admitting: Internal Medicine

## 2022-11-26 DIAGNOSIS — Z96641 Presence of right artificial hip joint: Secondary | ICD-10-CM | POA: Diagnosis not present

## 2022-11-26 DIAGNOSIS — R918 Other nonspecific abnormal finding of lung field: Secondary | ICD-10-CM | POA: Diagnosis not present

## 2022-11-26 DIAGNOSIS — J439 Emphysema, unspecified: Secondary | ICD-10-CM | POA: Diagnosis not present

## 2022-11-26 DIAGNOSIS — R911 Solitary pulmonary nodule: Secondary | ICD-10-CM | POA: Diagnosis not present

## 2022-11-28 ENCOUNTER — Other Ambulatory Visit: Payer: Self-pay

## 2022-11-28 DIAGNOSIS — R918 Other nonspecific abnormal finding of lung field: Secondary | ICD-10-CM

## 2022-12-02 ENCOUNTER — Encounter: Payer: Medicare PPO | Admitting: Neurosurgery

## 2022-12-03 ENCOUNTER — Telehealth: Payer: Self-pay | Admitting: Primary Care

## 2022-12-03 DIAGNOSIS — I6381 Other cerebral infarction due to occlusion or stenosis of small artery: Secondary | ICD-10-CM | POA: Diagnosis not present

## 2022-12-03 DIAGNOSIS — G459 Transient cerebral ischemic attack, unspecified: Secondary | ICD-10-CM | POA: Diagnosis not present

## 2022-12-03 DIAGNOSIS — Z85118 Personal history of other malignant neoplasm of bronchus and lung: Secondary | ICD-10-CM | POA: Diagnosis not present

## 2022-12-03 DIAGNOSIS — F0390 Unspecified dementia without behavioral disturbance: Secondary | ICD-10-CM | POA: Diagnosis not present

## 2022-12-03 DIAGNOSIS — F419 Anxiety disorder, unspecified: Secondary | ICD-10-CM | POA: Diagnosis not present

## 2022-12-03 DIAGNOSIS — Z982 Presence of cerebrospinal fluid drainage device: Secondary | ICD-10-CM | POA: Diagnosis not present

## 2022-12-03 DIAGNOSIS — E538 Deficiency of other specified B group vitamins: Secondary | ICD-10-CM | POA: Diagnosis not present

## 2022-12-03 DIAGNOSIS — J9611 Chronic respiratory failure with hypoxia: Secondary | ICD-10-CM | POA: Diagnosis not present

## 2022-12-03 DIAGNOSIS — E1165 Type 2 diabetes mellitus with hyperglycemia: Secondary | ICD-10-CM | POA: Diagnosis not present

## 2022-12-03 DIAGNOSIS — G912 (Idiopathic) normal pressure hydrocephalus: Secondary | ICD-10-CM | POA: Diagnosis not present

## 2022-12-03 NOTE — Telephone Encounter (Addendum)
Occupational Therapist call in from [facility] to make PCP aware that pt care giver refused pt OT this week [facility] # [phone]

## 2022-12-03 NOTE — Telephone Encounter (Signed)
Home Health verbal orders [place] Agency Name: [facility]  Callback number: [phone]  Requesting OT/PT/Skilled nursing/Social Work/Speech:  Reason: recertification  for OT  Frequency:  Please forward to [facility] pool or providers CMA

## 2022-12-04 NOTE — Telephone Encounter (Signed)
Approved.  

## 2022-12-04 NOTE — Telephone Encounter (Signed)
Amanda Porter approval of verbal orders.

## 2022-12-09 NOTE — Telephone Encounter (Signed)
error 

## 2022-12-10 ENCOUNTER — Other Ambulatory Visit: Payer: Self-pay | Admitting: Primary Care

## 2022-12-10 DIAGNOSIS — F03A4 Unspecified dementia, mild, with anxiety: Secondary | ICD-10-CM | POA: Diagnosis not present

## 2022-12-10 DIAGNOSIS — F411 Generalized anxiety disorder: Secondary | ICD-10-CM | POA: Diagnosis not present

## 2022-12-10 DIAGNOSIS — F331 Major depressive disorder, recurrent, moderate: Secondary | ICD-10-CM | POA: Diagnosis not present

## 2022-12-10 DIAGNOSIS — F419 Anxiety disorder, unspecified: Secondary | ICD-10-CM

## 2022-12-10 DIAGNOSIS — F03A3 Unspecified dementia, mild, with mood disturbance: Secondary | ICD-10-CM | POA: Diagnosis not present

## 2022-12-11 ENCOUNTER — Encounter: Payer: Medicare PPO | Admitting: Neurosurgery

## 2022-12-14 DIAGNOSIS — S7291XD Unspecified fracture of right femur, subsequent encounter for closed fracture with routine healing: Secondary | ICD-10-CM | POA: Diagnosis not present

## 2022-12-14 DIAGNOSIS — G912 (Idiopathic) normal pressure hydrocephalus: Secondary | ICD-10-CM | POA: Diagnosis not present

## 2022-12-14 DIAGNOSIS — J9601 Acute respiratory failure with hypoxia: Secondary | ICD-10-CM | POA: Diagnosis not present

## 2022-12-14 DIAGNOSIS — E1165 Type 2 diabetes mellitus with hyperglycemia: Secondary | ICD-10-CM | POA: Diagnosis not present

## 2022-12-15 ENCOUNTER — Telehealth: Payer: Self-pay | Admitting: Primary Care

## 2022-12-15 NOTE — Telephone Encounter (Signed)
Approved.  

## 2022-12-15 NOTE — Telephone Encounter (Signed)
Home Health verbal orders Caller Name: stephanie  Agency Name: [facility]  Callback number: [phone], secured   Requesting OT/PT/Skilled nursing/Social Work/Speech: OT   Reason:  Frequency: one x six   Please forward to [facility] pool or providers CMA

## 2022-12-16 ENCOUNTER — Inpatient Hospital Stay
Admission: EM | Admit: 2022-12-16 | Discharge: 2022-12-18 | DRG: 377 | Disposition: A | Discharge status: Home Health | Payer: Medicare PPO | Attending: Student | Admitting: Student

## 2022-12-16 ENCOUNTER — Other Ambulatory Visit: Payer: Self-pay

## 2022-12-16 ENCOUNTER — Encounter: Payer: Self-pay | Admitting: Internal Medicine

## 2022-12-16 ENCOUNTER — Emergency Department: Payer: Medicare PPO

## 2022-12-16 ENCOUNTER — Encounter
Admission: EM | Disposition: A | Discharge status: Home Health | Payer: Self-pay | Source: Home / Self Care | Attending: Student

## 2022-12-16 ENCOUNTER — Inpatient Hospital Stay: Payer: Medicare PPO | Admitting: Anesthesiology

## 2022-12-16 DIAGNOSIS — Z8249 Family history of ischemic heart disease and other diseases of the circulatory system: Secondary | ICD-10-CM

## 2022-12-16 DIAGNOSIS — Z79899 Other long term (current) drug therapy: Secondary | ICD-10-CM

## 2022-12-16 DIAGNOSIS — E611 Iron deficiency: Secondary | ICD-10-CM | POA: Diagnosis present

## 2022-12-16 DIAGNOSIS — F0394 Unspecified dementia, unspecified severity, with anxiety: Secondary | ICD-10-CM | POA: Diagnosis present

## 2022-12-16 DIAGNOSIS — E119 Type 2 diabetes mellitus without complications: Secondary | ICD-10-CM

## 2022-12-16 DIAGNOSIS — K31819 Angiodysplasia of stomach and duodenum without bleeding: Secondary | ICD-10-CM | POA: Diagnosis not present

## 2022-12-16 DIAGNOSIS — W19XXXA Unspecified fall, initial encounter: Principal | ICD-10-CM

## 2022-12-16 DIAGNOSIS — J449 Chronic obstructive pulmonary disease, unspecified: Secondary | ICD-10-CM | POA: Diagnosis present

## 2022-12-16 DIAGNOSIS — R195 Other fecal abnormalities: Secondary | ICD-10-CM | POA: Diagnosis not present

## 2022-12-16 DIAGNOSIS — D649 Anemia, unspecified: Secondary | ICD-10-CM | POA: Diagnosis present

## 2022-12-16 DIAGNOSIS — J440 Chronic obstructive pulmonary disease with acute lower respiratory infection: Secondary | ICD-10-CM | POA: Diagnosis present

## 2022-12-16 DIAGNOSIS — Z7984 Long term (current) use of oral hypoglycemic drugs: Secondary | ICD-10-CM

## 2022-12-16 DIAGNOSIS — E785 Hyperlipidemia, unspecified: Secondary | ICD-10-CM | POA: Diagnosis present

## 2022-12-16 DIAGNOSIS — Z8673 Personal history of transient ischemic attack (TIA), and cerebral infarction without residual deficits: Secondary | ICD-10-CM | POA: Diagnosis not present

## 2022-12-16 DIAGNOSIS — Z66 Do not resuscitate: Secondary | ICD-10-CM | POA: Diagnosis not present

## 2022-12-16 DIAGNOSIS — F419 Anxiety disorder, unspecified: Secondary | ICD-10-CM | POA: Diagnosis not present

## 2022-12-16 DIAGNOSIS — Z79818 Long term (current) use of other agents affecting estrogen receptors and estrogen levels: Secondary | ICD-10-CM

## 2022-12-16 DIAGNOSIS — Z83438 Family history of other disorder of lipoprotein metabolism and other lipidemia: Secondary | ICD-10-CM

## 2022-12-16 DIAGNOSIS — K219 Gastro-esophageal reflux disease without esophagitis: Secondary | ICD-10-CM | POA: Diagnosis not present

## 2022-12-16 DIAGNOSIS — Z87891 Personal history of nicotine dependence: Secondary | ICD-10-CM

## 2022-12-16 DIAGNOSIS — K264 Chronic or unspecified duodenal ulcer with hemorrhage: Secondary | ICD-10-CM | POA: Insufficient documentation

## 2022-12-16 DIAGNOSIS — F0390 Unspecified dementia without behavioral disturbance: Secondary | ICD-10-CM | POA: Diagnosis present

## 2022-12-16 DIAGNOSIS — G912 (Idiopathic) normal pressure hydrocephalus: Secondary | ICD-10-CM | POA: Diagnosis present

## 2022-12-16 DIAGNOSIS — Z7951 Long term (current) use of inhaled steroids: Secondary | ICD-10-CM

## 2022-12-16 DIAGNOSIS — E43 Unspecified severe protein-calorie malnutrition: Secondary | ICD-10-CM | POA: Diagnosis not present

## 2022-12-16 DIAGNOSIS — Z7982 Long term (current) use of aspirin: Secondary | ICD-10-CM

## 2022-12-16 DIAGNOSIS — Z808 Family history of malignant neoplasm of other organs or systems: Secondary | ICD-10-CM

## 2022-12-16 DIAGNOSIS — Z681 Body mass index (BMI) 19 or less, adult: Secondary | ICD-10-CM | POA: Diagnosis not present

## 2022-12-16 DIAGNOSIS — Y95 Nosocomial condition: Secondary | ICD-10-CM | POA: Diagnosis present

## 2022-12-16 DIAGNOSIS — F418 Other specified anxiety disorders: Secondary | ICD-10-CM | POA: Diagnosis not present

## 2022-12-16 DIAGNOSIS — K2980 Duodenitis without bleeding: Secondary | ICD-10-CM | POA: Diagnosis not present

## 2022-12-16 DIAGNOSIS — F32A Depression, unspecified: Secondary | ICD-10-CM | POA: Diagnosis present

## 2022-12-16 DIAGNOSIS — Z1152 Encounter for screening for COVID-19: Secondary | ICD-10-CM

## 2022-12-16 DIAGNOSIS — Z96641 Presence of right artificial hip joint: Secondary | ICD-10-CM | POA: Diagnosis present

## 2022-12-16 DIAGNOSIS — J439 Emphysema, unspecified: Secondary | ICD-10-CM | POA: Diagnosis not present

## 2022-12-16 DIAGNOSIS — K31811 Angiodysplasia of stomach and duodenum with bleeding: Secondary | ICD-10-CM | POA: Diagnosis not present

## 2022-12-16 DIAGNOSIS — K921 Melena: Secondary | ICD-10-CM | POA: Diagnosis not present

## 2022-12-16 DIAGNOSIS — Z9981 Dependence on supplemental oxygen: Secondary | ICD-10-CM

## 2022-12-16 DIAGNOSIS — Z043 Encounter for examination and observation following other accident: Secondary | ICD-10-CM | POA: Diagnosis not present

## 2022-12-16 DIAGNOSIS — R531 Weakness: Secondary | ICD-10-CM

## 2022-12-16 DIAGNOSIS — E440 Moderate protein-calorie malnutrition: Secondary | ICD-10-CM | POA: Insufficient documentation

## 2022-12-16 DIAGNOSIS — Z85118 Personal history of other malignant neoplasm of bronchus and lung: Secondary | ICD-10-CM

## 2022-12-16 DIAGNOSIS — Z902 Acquired absence of lung [part of]: Secondary | ICD-10-CM

## 2022-12-16 DIAGNOSIS — J189 Pneumonia, unspecified organism: Secondary | ICD-10-CM

## 2022-12-16 DIAGNOSIS — I10 Essential (primary) hypertension: Secondary | ICD-10-CM | POA: Diagnosis present

## 2022-12-16 DIAGNOSIS — R911 Solitary pulmonary nodule: Secondary | ICD-10-CM | POA: Diagnosis present

## 2022-12-16 DIAGNOSIS — Z888 Allergy status to other drugs, medicaments and biological substances status: Secondary | ICD-10-CM | POA: Diagnosis not present

## 2022-12-16 DIAGNOSIS — Z982 Presence of cerebrospinal fluid drainage device: Secondary | ICD-10-CM

## 2022-12-16 DIAGNOSIS — I6381 Other cerebral infarction due to occlusion or stenosis of small artery: Secondary | ICD-10-CM | POA: Diagnosis not present

## 2022-12-16 DIAGNOSIS — F0393 Unspecified dementia, unspecified severity, with mood disturbance: Secondary | ICD-10-CM | POA: Diagnosis present

## 2022-12-16 DIAGNOSIS — Z800 Family history of malignant neoplasm of digestive organs: Secondary | ICD-10-CM

## 2022-12-16 DIAGNOSIS — M47812 Spondylosis without myelopathy or radiculopathy, cervical region: Secondary | ICD-10-CM | POA: Diagnosis not present

## 2022-12-16 DIAGNOSIS — J168 Pneumonia due to other specified infectious organisms: Secondary | ICD-10-CM | POA: Diagnosis not present

## 2022-12-16 DIAGNOSIS — T7840XA Allergy, unspecified, initial encounter: Secondary | ICD-10-CM | POA: Diagnosis not present

## 2022-12-16 DIAGNOSIS — Z833 Family history of diabetes mellitus: Secondary | ICD-10-CM

## 2022-12-16 DIAGNOSIS — Y92009 Unspecified place in unspecified non-institutional (private) residence as the place of occurrence of the external cause: Secondary | ICD-10-CM

## 2022-12-16 HISTORY — PX: ESOPHAGOGASTRODUODENOSCOPY (EGD) WITH PROPOFOL: SHX5813

## 2022-12-16 HISTORY — DX: Pneumonia, unspecified organism: J18.9

## 2022-12-16 LAB — CBC
HCT: 30.4 % — ABNORMAL LOW (ref 36.0–46.0)
HCT: 30 % — ABNORMAL LOW (ref 36.0–46.0)
HCT: 25.9 % — ABNORMAL LOW (ref 36.0–46.0)
Hemoglobin: 9.2 g/dL — ABNORMAL LOW (ref 12.0–15.0)
Hemoglobin: 8.9 g/dL — ABNORMAL LOW (ref 12.0–15.0)
Hemoglobin: 7.8 g/dL — ABNORMAL LOW (ref 12.0–15.0)
MCH: 26.7 pg (ref 26.0–34.0)
MCH: 26 pg (ref 26.0–34.0)
MCH: 26.4 pg (ref 26.0–34.0)
MCHC: 30.3 g/dL (ref 30.0–36.0)
MCHC: 29.7 g/dL — ABNORMAL LOW (ref 30.0–36.0)
MCHC: 30.1 g/dL (ref 30.0–36.0)
MCV: 88.1 fL (ref 80.0–100.0)
MCV: 87.7 fL (ref 80.0–100.0)
MCV: 87.5 fL (ref 80.0–100.0)
Platelets: 347 10*3/uL (ref 150–400)
Platelets: 360 10*3/uL (ref 150–400)
Platelets: 278 10*3/uL (ref 150–400)
RBC: 3.45 MIL/uL — ABNORMAL LOW (ref 3.87–5.11)
RBC: 3.42 MIL/uL — ABNORMAL LOW (ref 3.87–5.11)
RBC: 2.96 MIL/uL — ABNORMAL LOW (ref 3.87–5.11)
RDW: 15.4 % (ref 11.5–15.5)
RDW: 15.2 % (ref 11.5–15.5)
RDW: 15.2 % (ref 11.5–15.5)
WBC: 15.6 10*3/uL — ABNORMAL HIGH (ref 4.0–10.5)
WBC: 13.2 10*3/uL — ABNORMAL HIGH (ref 4.0–10.5)
WBC: 10.6 10*3/uL — ABNORMAL HIGH (ref 4.0–10.5)
nRBC: 0 % (ref 0.0–0.2)
nRBC: 0 % (ref 0.0–0.2)
nRBC: 0 % (ref 0.0–0.2)

## 2022-12-16 LAB — LACTIC ACID, PLASMA
Lactic Acid, Venous: 0.8 mmol/L (ref 0.5–1.9)
Lactic Acid, Venous: 1.2 mmol/L (ref 0.5–1.9)

## 2022-12-16 LAB — FOLATE: Folate: 19 ng/mL (ref >5.9)

## 2022-12-16 LAB — RESP PANEL BY RT-PCR (RSV, FLU A&B, COVID)  RVPGX2
Influenza A by PCR: NEGATIVE
Influenza B by PCR: NEGATIVE
Resp Syncytial Virus by PCR: NEGATIVE
SARS Coronavirus 2 by RT PCR: NEGATIVE

## 2022-12-16 LAB — BASIC METABOLIC PANEL
Anion gap: 8 (ref 5–15)
BUN: 23 mg/dL (ref 8–23)
CO2: 30 mmol/L (ref 22–32)
Calcium: 8.9 mg/dL (ref 8.9–10.3)
Chloride: 101 mmol/L (ref 98–111)
Creatinine, Ser: 0.69 mg/dL (ref 0.44–1.00)
GFR, Estimated: >60 mL/min (ref >60)
Glucose, Bld: 159 mg/dL — ABNORMAL HIGH (ref 70–99)
Potassium: 3.7 mmol/L (ref 3.5–5.1)
Sodium: 139 mmol/L (ref 135–145)

## 2022-12-16 LAB — APTT: aPTT: 38 seconds — ABNORMAL HIGH (ref 24–36)

## 2022-12-16 LAB — TYPE AND SCREEN
ABO/RH(D): B POS
Antibody Screen: NEGATIVE

## 2022-12-16 LAB — CBG MONITORING, ED: Glucose-Capillary: 136 mg/dL — ABNORMAL HIGH (ref 70–99)

## 2022-12-16 LAB — IRON AND TIBC
Iron: 18 ug/dL — ABNORMAL LOW (ref 28–170)
Saturation Ratios: 5 % — ABNORMAL LOW (ref 10.4–31.8)
TIBC: 378 ug/dL (ref 250–450)
UIBC: 360 ug/dL

## 2022-12-16 LAB — RETICULOCYTES
Immature Retic Fract: 18 % — ABNORMAL HIGH (ref 2.3–15.9)
RBC.: 3.43 MIL/uL — ABNORMAL LOW (ref 3.87–5.11)
Retic Count, Absolute: 53.5 10*3/uL (ref 19.0–186.0)
Retic Ct Pct: 1.6 % (ref 0.4–3.1)

## 2022-12-16 LAB — MRSA NEXT GEN BY PCR, NASAL: MRSA by PCR Next Gen: DETECTED — AB

## 2022-12-16 LAB — PROTIME-INR
INR: 1.2 (ref 0.8–1.2)
Prothrombin Time: 14.8 seconds (ref 11.4–15.2)

## 2022-12-16 LAB — GLUCOSE, CAPILLARY
Glucose-Capillary: 95 mg/dL (ref 70–99)
Glucose-Capillary: 96 mg/dL (ref 70–99)
Glucose-Capillary: 102 mg/dL — ABNORMAL HIGH (ref 70–99)

## 2022-12-16 LAB — TROPONIN I (HIGH SENSITIVITY): Troponin I (High Sensitivity): 7 ng/L (ref <18)

## 2022-12-16 LAB — PROCALCITONIN: Procalcitonin: <0.1 ng/mL

## 2022-12-16 LAB — FERRITIN: Ferritin: 28 ng/mL (ref 11–307)

## 2022-12-16 SURGERY — ESOPHAGOGASTRODUODENOSCOPY (EGD) WITH PROPOFOL
Anesthesia: General

## 2022-12-16 MED ORDER — INSULIN ASPART 100 UNIT/ML IJ SOLN: 0.0000 [IU] | Freq: Every day | INTRAMUSCULAR | Status: DC

## 2022-12-16 MED ORDER — MELATONIN 5 MG PO TABS
10.0000 mg | ORAL_TABLET | Freq: Every day | ORAL | Status: DC
  Administered 2022-12-16 – 2022-12-17 (×2): 10 mg via ORAL
  Filled 2022-12-16 (×2): qty 2

## 2022-12-16 MED ORDER — ENSURE ENLIVE PO LIQD
237.0000 mL | Freq: Two times a day (BID) | ORAL | Status: DC
  Administered 2022-12-17 – 2022-12-18 (×2): 237 mL via ORAL

## 2022-12-16 MED ORDER — PANTOPRAZOLE SODIUM 40 MG IV SOLR
40.0000 mg | Freq: Two times a day (BID) | INTRAVENOUS | Status: DC
  Administered 2022-12-16 – 2022-12-17 (×3): 40 mg via INTRAVENOUS
  Filled 2022-12-16 (×3): qty 10

## 2022-12-16 MED ORDER — SODIUM CHLORIDE 0.9 % IV SOLN
2.0000 g | Freq: Two times a day (BID) | INTRAVENOUS | Status: DC
  Administered 2022-12-17 – 2022-12-18 (×2): 2 g via INTRAVENOUS
  Filled 2022-12-16 (×2): qty 12.5
  Filled 2022-12-16 (×2): qty 2
  Filled 2022-12-16: qty 12.5

## 2022-12-16 MED ORDER — BUSPIRONE HCL 10 MG PO TABS
30.0000 mg | ORAL_TABLET | Freq: Two times a day (BID) | ORAL | Status: DC
  Administered 2022-12-17 – 2022-12-18 (×3): 30 mg via ORAL
  Filled 2022-12-16 (×4): qty 3

## 2022-12-16 MED ORDER — BUPROPION HCL ER (XL) 150 MG PO TB24
300.0000 mg | ORAL_TABLET | Freq: Every day | ORAL | Status: DC
  Administered 2022-12-17 – 2022-12-18 (×2): 300 mg via ORAL
  Filled 2022-12-16 (×2): qty 2

## 2022-12-16 MED ORDER — PROPOFOL 10 MG/ML IV BOLUS
INTRAVENOUS | Status: DC | PRN
  Administered 2022-12-16: 40 mg via INTRAVENOUS

## 2022-12-16 MED ORDER — DM-GUAIFENESIN ER 30-600 MG PO TB12: 1.0000 | ORAL_TABLET | Freq: Two times a day (BID) | ORAL | Status: DC | PRN

## 2022-12-16 MED ORDER — FERROUS SULFATE 325 (65 FE) MG PO TABS
325.0000 mg | ORAL_TABLET | Freq: Every day | ORAL | Status: DC
  Administered 2022-12-17 – 2022-12-18 (×2): 325 mg via ORAL
  Filled 2022-12-16 (×2): qty 1

## 2022-12-16 MED ORDER — PROPOFOL 1000 MG/100ML IV EMUL
INTRAVENOUS | Status: AC
  Filled 2022-12-16: qty 100

## 2022-12-16 MED ORDER — LIDOCAINE HCL (CARDIAC) PF 100 MG/5ML IV SOSY
PREFILLED_SYRINGE | INTRAVENOUS | Status: DC | PRN
  Administered 2022-12-16 (×2): 20 mg via INTRAVENOUS

## 2022-12-16 MED ORDER — CLONAZEPAM 1 MG PO TABS
1.0000 mg | ORAL_TABLET | Freq: Every day | ORAL | Status: DC
  Administered 2022-12-17: 1 mg via ORAL
  Filled 2022-12-16 (×2): qty 1

## 2022-12-16 MED ORDER — ALBUTEROL SULFATE HFA 108 (90 BASE) MCG/ACT IN AERS: 2.0000 | INHALATION_SPRAY | RESPIRATORY_TRACT | Status: DC | PRN

## 2022-12-16 MED ORDER — VANCOMYCIN HCL IN DEXTROSE 1-5 GM/200ML-% IV SOLN
1000.0000 mg | INTRAVENOUS | Status: DC
  Administered 2022-12-16 – 2022-12-18 (×2): 1000 mg via INTRAVENOUS
  Filled 2022-12-16 (×3): qty 200

## 2022-12-16 MED ORDER — SODIUM CHLORIDE 0.9 % IV SOLN: INTRAVENOUS | Status: DC

## 2022-12-16 MED ORDER — ROSUVASTATIN CALCIUM 10 MG PO TABS
20.0000 mg | ORAL_TABLET | Freq: Every evening | ORAL | Status: DC
  Administered 2022-12-17: 20 mg via ORAL
  Filled 2022-12-16: qty 1
  Filled 2022-12-16: qty 2

## 2022-12-16 MED ORDER — SODIUM CHLORIDE 0.9 % IV SOLN: INTRAVENOUS | Status: DC | PRN

## 2022-12-16 MED ORDER — MEMANTINE HCL 5 MG PO TABS
5.0000 mg | ORAL_TABLET | Freq: Two times a day (BID) | ORAL | Status: DC
  Administered 2022-12-17 – 2022-12-18 (×3): 5 mg via ORAL
  Filled 2022-12-16 (×4): qty 1

## 2022-12-16 MED ORDER — SODIUM CHLORIDE 0.9 % IV SOLN
1.0000 g | Freq: Once | INTRAVENOUS | Status: AC
  Administered 2022-12-16: 1 g via INTRAVENOUS
  Filled 2022-12-16: qty 10

## 2022-12-16 MED ORDER — BREZTRI AEROSPHERE 160-9-4.8 MCG/ACT IN AERO: 2.0000 | INHALATION_SPRAY | Freq: Two times a day (BID) | RESPIRATORY_TRACT | 0 refills | Status: DC

## 2022-12-16 MED ORDER — SODIUM CHLORIDE 0.9 % IV SOLN: 500.0000 mg | Freq: Once | INTRAVENOUS | Status: DC

## 2022-12-16 MED ORDER — INSULIN ASPART 100 UNIT/ML IJ SOLN
0.0000 [IU] | Freq: Three times a day (TID) | INTRAMUSCULAR | Status: DC
  Administered 2022-12-18 (×2): 2 [IU] via SUBCUTANEOUS
  Filled 2022-12-16 (×2): qty 1

## 2022-12-16 MED ORDER — IPRATROPIUM-ALBUTEROL 0.5-2.5 (3) MG/3ML IN SOLN
3.0000 mL | Freq: Four times a day (QID) | RESPIRATORY_TRACT | Status: DC
  Administered 2022-12-16 – 2022-12-17 (×2): 3 mL via RESPIRATORY_TRACT
  Filled 2022-12-16 (×3): qty 3

## 2022-12-16 MED ORDER — PROPOFOL 500 MG/50ML IV EMUL
INTRAVENOUS | Status: DC | PRN
  Administered 2022-12-16: 50 ug/kg/min via INTRAVENOUS

## 2022-12-16 MED ORDER — ALBUTEROL SULFATE (2.5 MG/3ML) 0.083% IN NEBU: 2.5000 mg | INHALATION_SOLUTION | RESPIRATORY_TRACT | Status: DC | PRN

## 2022-12-16 NOTE — Consult Note (Signed)
Amanda Minium, MD [facility]  [address], [address] [place], [place] [address] Phone: [phone] Fax : [phone]  Consultation  Referring Provider:     Dr. Clyde Lundborg Primary Care Physician:  Doreene Nest, NP Primary Gastroenterologist:  Gentry Fitz        Reason for Consultation:     Rectal bleeding  Date of Admission:  12/16/2022 Date of Consultation:  12/16/2022         HPI:   Amanda Porter is a 77 y.o. adult who was admitted to the hospital today for an unwitnessed fall.  The patient was found to have leukocytosis and hemoglobin of 9.2 which went down to 8.9.  The patient's baseline from 2 months ago was approximately 7.2-8.6.  The patient had an EGD and colonoscopy back in July 2018 and probably that showed:  05/2017 Colonoscopy: FINDINGS: 1. An approx 8  mm flat semi-pedunculated  polyp in T. colon , hot snared  2. A 10 mm pedunculated polyp in T. colon, hot snared. Stalk treated with a Hemoclip  3. There was a sessile 8 mm polyp in the A. colon, hot snared  4. Severe left-sided diverticulosis and moderate pandiverticulosis, requiring a Ped Colonoscope   EGD: FINDINGS: Nl esophagus, emiprically dialted to 18 mm. F/u look revealed a broken web in the prox esophagus  Non-erosive gastritis in the antrum, biopsied  Nl duodenum   The patient was reported to have a history of black stools but is taking iron.  It was also reported as black tarry stools.  The patient has a history of COPD on 3 L of oxygen, dementia, depression and a stroke.  The patient also was on a baby aspirin that was stopped.  The patient's blood work recently has shown:  Component     Latest Ref Rng 09/22/2022 09/23/2022 10/13/2022  Hemoglobin     12.0 - 15.0 g/dL 7.1 (L)  7.5 (L)  8.6 Repeated and verified X2. (L)   HCT     36.0 - 46.0 % 23.6 (L)  25.0 (L)  27.6 (L)    Component     Latest Ref Rng 11/10/2022 12/16/2022  Hemoglobin     12.0 - 15.0 g/dL 9.4 (L)  8.9 (L)   Hemoglobin       9.2 (L)    HCT     36.0 - 46.0 % 32.0 (L)  30.0 (L)   HCT       30.4 (L)     Past Medical History:  Diagnosis Date   Acute respiratory failure requiring reintubation (HCC) 11/05/2021   Anemia    Anxiety    Anxiety and depression    COPD (chronic obstructive pulmonary disease) (HCC)    CVA (cerebral vascular accident) (HCC)    CVA (cerebral vascular accident) (HCC)    L sided deficits   Dementia (HCC)    Depression    Diabetes mellitus (HCC)    Type 2   Diabetes mellitus without complication (HCC)    Dysphagia    Dysphasia    Falls    GERD (gastroesophageal reflux disease)    History of lung cancer    Hyperlipidemia    Hypertension    Influenza A 12/13/2021   Memory disturbance 11/20/2017   Normal pressure hydrocephalus (HCC) 2018   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    Past Surgical History:  Procedure Laterality Date   HIP ARTHROPLASTY Right 09/07/2022   Procedure: ARTHROPLASTY BIPOLAR HIP (HEMIARTHROPLASTY);  Surgeon: Lyndle Herrlich, MD;  Location: [facility] ORS;  Service: Orthopedics;  Laterality: Right;   IR FL GUIDED LOC OF NEEDLE/CATH TIP FOR SPINAL INJECTION RT  07/25/2022   LUNG REMOVAL, PARTIAL     left upper lobe   SHUNT REVISION VENTRICULAR-PERITONEAL N/A 09/19/2022   Procedure: VENTRICULOPERITONEAL SHUNT REVISION;  Surgeon: Venetia Night, MD;  Location: [facility] ORS;  Service: Neurosurgery;  Laterality: N/A;   VENTRICULOPERITONEAL SHUNT  12/09/2016   VENTRICULOPERITONEAL SHUNT  10/2018    Prior to Admission medications   Medication Sig Start Date End Date Taking? Authorizing Provider  albuterol (VENTOLIN HFA) 108 (90 Base) MCG/ACT inhaler INHALE 2 PUFFS BY MOUTH EVERY 4 HOURS AS NEEDED Patient taking differently: Inhale 2 puffs into the  lungs every 4 (four) hours as needed for wheezing or shortness of breath. 07/11/22  Yes Young, Joni Fears D, MD  Albuterol Sulfate 2.5 MG/0.5ML NEBU Inhale 2.5 mg into the lungs every 6 (six) hours as needed (shortness of breath).   Yes [provider]  amLODipine (NORVASC) 2.5 MG tablet Take 1 tablet (2.5 mg total) by mouth daily. for blood pressure. 08/19/22  Yes Doreene Nest, NP  aspirin 81 MG chewable tablet Chew 1 tablet (81 mg total) by mouth daily. 09/13/22  Yes Meredeth Ide, MD  buPROPion (WELLBUTRIN XL) 300 MG 24 hr tablet Take 1 tablet (300 mg total) by mouth daily with breakfast. for anxiety and depression. 11/10/22  Yes Doreene Nest, NP  busPIRone (BUSPAR) 15 MG tablet Take 30 mg by mouth 2 (two) times daily.   Yes [provider]  Calcium Carb-Cholecalciferol (CALCIUM/VITAMIN D PO) Take 1 tablet by mouth daily.   Yes [provider]  clonazePAM (KLONOPIN) 1 MG tablet Take 1 mg by mouth at bedtime.   Yes [provider]  conjugated estrogens (PREMARIN) vaginal cream Apply one pea-sized amount around the opening of the urethra daily for 2 weeks, then 3 times weekly moving forward. 08/20/22  Yes Vaillancourt, Samantha, PA-C  glipiZIDE (GLUCOTROL XL) 2.5 MG 24 hr tablet TAKE 1 TABLET (2.5 MG TOTAL) BY MOUTH DAILY WITH BREAKFAST. FOR DIABETES. 08/04/22  Yes Doreene Nest, NP  Melatonin 10 MG TABS Take 10 mg by mouth at bedtime.   Yes [provider]  memantine (NAMENDA) 5 MG tablet Take 1 tablet (5 mg total) by mouth 2 (two) times daily. For memory. 07/14/22  Yes Doreene Nest, NP  rosuvastatin (CRESTOR) 20 MG tablet TAKE 1 TABLET (20 MG TOTAL) BY MOUTH EVERY EVENING. FOR CHOLESTEROL. 07/13/22  Yes Doreene Nest, NP  Budeson-Glycopyrrol-Formoterol (BREZTRI AEROSPHERE) 160-9-4.8 MCG/ACT AERO Inhale 2 puffs into the lungs in the morning and at bedtime. Patient not taking: Reported on 12/16/2022 12/16/22   Waymon Budge, MD   clonazePAM (KLONOPIN) 0.5 MG tablet Take 1 tablet (0.5 mg total) by mouth at bedtime. Patient not taking: Reported on 12/16/2022 09/12/22   Meredeth Ide, MD  folic acid (FOLVITE) 1 MG tablet Take 1 tablet (1 mg total) by mouth daily. Patient not taking: Reported on 12/16/2022 12/13/21   Doreene Nest, NP  potassium chloride SA (KLOR-CON M) 20 MEQ tablet Take 1 tablet (20 mEq total) by mouth daily for 3 days. Patient not taking: Reported  on 12/16/2022 11/11/22 11/14/22  Corena Herter, MD  Tiotropium Bromide-Olodaterol (STIOLTO RESPIMAT) 2.5-2.5 MCG/ACT AERS INHALE 2 PUFFS BY MOUTH INTO THE LUNGS DAILY Patient not taking: Reported on 12/16/2022 12/26/21   Waymon Budge, MD    Family History  Problem Relation Age of Onset   Pneumonia Mother    Cancer Father    Hyperlipidemia Sister    Hypertension Sister    Heart disease Sister    Diabetes Sister    Hypertension Sister    Diabetes Sister    Pancreatic cancer Sister    Throat cancer Brother    Cancer Brother    Cancer Brother    Breast cancer Neg Hx      Social History   Tobacco Use   Smoking status: Former    Packs/day: 0.50    Years: 50.00    Total pack years: 25.00    Types: Cigarettes    Quit date: 08/01/2017    Years since quitting: 5.3    Passive exposure: Past   Smokeless tobacco: Never  Vaping Use   Vaping Use: Never used  Substance Use Topics   Alcohol use: Not Currently   Drug use: Not Currently    Allergies as of 12/16/2022 - Review Complete 12/16/2022  Allergen Reaction Noted   Lexapro [escitalopram oxalate] Other (See Comments) 09/23/2017   Neosporin  [neomycin-polymyxin-gramicidin] Rash    Neosporin [bacitracin-polymyxin b] Rash 11/04/2021    Review of Systems:    All systems reviewed and negative except where noted in HPI.   Physical Exam:  Vital signs in last 24 hours: Temp:  [98.4 F (36.9 C)-99.3 F (37.4 C)] 98.4 F (36.9 C) (01/16 1439) Pulse Rate:  [89-101] 101 (01/16 1439) Resp:   [16-17] 16 (01/16 1224) BP: (103-134)/(67-87) 134/76 (01/16 1439) SpO2:  [93 %-99 %] 93 % (01/16 1439) Weight:  [45.4 kg] 45.4 kg (01/16 0947) Last BM Date : 12/16/22 General:   Pleasant, cooperative in NAD Head:  Normocephalic and atraumatic. Eyes:   No icterus.   Conjunctiva pink. PERRLA. Ears:  Normal auditory acuity. Neck:  Supple; no masses or thyroidomegaly Lungs: Respirations even and unlabored. Lungs clear to auscultation bilaterally.   No wheezes, crackles, or rhonchi.  Heart:  Regular rate and rhythm;  Without murmur, clicks, rubs or gallops Abdomen:  Soft, nondistended, nontender. Normal bowel sounds. No appreciable masses or hepatomegaly.  No rebound or guarding.  Rectal:  Not performed. Msk:  Symmetrical without gross deformities.    Extremities:  Without edema, cyanosis or clubbing. Neurologic:  Alert and oriented x3;  grossly normal neurologically. Skin:  Intact without significant lesions or rashes. Cervical Nodes:  No significant cervical adenopathy. Psych:  Alert and cooperative. Normal affect.  LAB RESULTS: Recent Labs    12/16/22 0953 12/16/22 1500  WBC 15.6* 13.2*  HGB 9.2* 8.9*  HCT 30.4* 30.0*  PLT 347 360   BMET Recent Labs    12/16/22 0953  NA 139  K 3.7  CL 101  CO2 30  GLUCOSE 159*  BUN 23  CREATININE 0.69  CALCIUM 8.9   LFT No results for input(s): "PROT", "ALBUMIN", "AST", "ALT", "ALKPHOS", "BILITOT", "BILIDIR", "IBILI" in the last 72 hours. PT/INR Recent Labs    12/16/22 1500  LABPROT 14.8  INR 1.2    STUDIES: CT Head Wo Contrast  Result Date: 12/16/2022 CLINICAL DATA:  Provided history: Fall.  Unwitnessed fall. EXAM: CT HEAD WITHOUT CONTRAST CT CERVICAL SPINE WITHOUT CONTRAST TECHNIQUE: Multidetector CT imaging of the head and  cervical spine was performed following the standard protocol without intravenous contrast. Multiplanar CT image reconstructions of the cervical spine were also generated. RADIATION DOSE REDUCTION: This exam  was performed according to the departmental dose-optimization program which includes automated exposure control, adjustment of the mA and/or kV according to patient size and/or use of iterative reconstruction technique. COMPARISON:  Prior head CT examinations 09/09/2022 and earlier. Cervical spine CT 09/06/2022. chest CT 11/26/2022. FINDINGS: CT HEAD FINDINGS Brain: Unchanged position of a right frontoparietal approach ventricular catheter which terminates within the right lateral ventricle (abutting the septum pellucidum). Ventriculomegaly is unchanged from the prior head CT of 09/09/2022. Redemonstrated chronic lacunar infarcts within the bilateral thalami. Advanced patchy and confluent hypoattenuation within the cerebral white matter, nonspecific but compatible with chronic small vessel images ease. There is no acute intracranial hemorrhage. No demarcated cortical infarct. No extra-axial fluid collection. No evidence of an intracranial mass. No midline shift. Vascular: No hyperdense vessel.  Atherosclerotic calcifications. Skull: No fracture or aggressive osseous lesion. Sinuses/Orbits: No mass or acute finding within the imaged orbits. Trace fluid within the right sphenoid sinus. CT CERVICAL SPINE FINDINGS Alignment: Reversal of the expected cervical lordosis. 2 mm C4-C5 grade 1 anterolisthesis. Skull base and vertebrae: No fracture or aggressive osseous lesion. Soft tissues and spinal canal: No prevertebral fluid or swelling. No visible canal hematoma. Disc levels: Cervical spondylosis with multilevel disc space narrowing, disc bulges/central disc protrusions, posterior disc osteophyte complexes and uncovertebral hypertrophy. Disc space narrowing is greatest at C5-C6 and C6-C7 (advanced at these levels). No appreciable high-grade spinal canal stenosis. Multilevel bony neural foraminal narrowing. Multilevel ventral osteophytes. Upper chest: Emphysema. Prior left upper lobectomy. 1.8 x 0.9 cm pulmonary nodule  within the superior aspect of the left lower lobe with somewhat spiculated margins, unchanged in size from the recent prior chest CT of 11/26/2022. Other: Aortic atherosclerosis. Subcentimeter nodule within the right thyroid lobe, not meeting criteria for ultrasound follow-up based on size. No follow-up imaging is recommended. Reference: J Am Coll Radiol. 2015 Feb;12(2): 143-50. IMPRESSION: CT head: 1. No evidence of acute intracranial abnormality. 2. Ventriculomegaly is unchanged from the prior head CT of 09/09/2022. Stable position of a right frontoparietal approach ventricular catheter. 3. Redemonstrated chronic lacunar infarcts within the bilateral thalami. 4. Advanced chronic small vessel ischemic changes within the cerebral white matter. CT cervical spine: 1. No evidence of acute fracture to the cervical spine. 2. Nonspecific reversal of the expected cervical lordosis. 3. 2 mm C4-C5 grade 1 anterolisthesis. 4. Cervical spondylosis, as described. 5. 1.8 x 0.9 cm pulmonary nodule within the superior aspect of the left lower lobe, unchanged in size from the recent prior chest CT of 11/26/2022. This may reflect scarring or low-grade adenocarcinoma. Please refer to the prior chest CT for follow-up recommendations. 6. Aortic Atherosclerosis (ICD10-I70.0) and Emphysema (ICD10-J43.9). Electronically Signed   By: Jackey Loge D.O.   On: 12/16/2022 12:11   CT Cervical Spine Wo Contrast  Result Date: 12/16/2022 CLINICAL DATA:  Provided history: Fall.  Unwitnessed fall. EXAM: CT HEAD WITHOUT CONTRAST CT CERVICAL SPINE WITHOUT CONTRAST TECHNIQUE: Multidetector CT imaging of the head and cervical spine was performed following the standard protocol without intravenous contrast. Multiplanar CT image reconstructions of the cervical spine were also generated. RADIATION DOSE REDUCTION: This exam was performed according to the departmental dose-optimization program which includes automated exposure control, adjustment of the  mA and/or kV according to patient size and/or use of iterative reconstruction technique. COMPARISON:  Prior head CT examinations 09/09/2022 and earlier.  Cervical spine CT 09/06/2022. chest CT 11/26/2022. FINDINGS: CT HEAD FINDINGS Brain: Unchanged position of a right frontoparietal approach ventricular catheter which terminates within the right lateral ventricle (abutting the septum pellucidum). Ventriculomegaly is unchanged from the prior head CT of 09/09/2022. Redemonstrated chronic lacunar infarcts within the bilateral thalami. Advanced patchy and confluent hypoattenuation within the cerebral white matter, nonspecific but compatible with chronic small vessel images ease. There is no acute intracranial hemorrhage. No demarcated cortical infarct. No extra-axial fluid collection. No evidence of an intracranial mass. No midline shift. Vascular: No hyperdense vessel.  Atherosclerotic calcifications. Skull: No fracture or aggressive osseous lesion. Sinuses/Orbits: No mass or acute finding within the imaged orbits. Trace fluid within the right sphenoid sinus. CT CERVICAL SPINE FINDINGS Alignment: Reversal of the expected cervical lordosis. 2 mm C4-C5 grade 1 anterolisthesis. Skull base and vertebrae: No fracture or aggressive osseous lesion. Soft tissues and spinal canal: No prevertebral fluid or swelling. No visible canal hematoma. Disc levels: Cervical spondylosis with multilevel disc space narrowing, disc bulges/central disc protrusions, posterior disc osteophyte complexes and uncovertebral hypertrophy. Disc space narrowing is greatest at C5-C6 and C6-C7 (advanced at these levels). No appreciable high-grade spinal canal stenosis. Multilevel bony neural foraminal narrowing. Multilevel ventral osteophytes. Upper chest: Emphysema. Prior left upper lobectomy. 1.8 x 0.9 cm pulmonary nodule within the superior aspect of the left lower lobe with somewhat spiculated margins, unchanged in size from the recent prior chest CT  of 11/26/2022. Other: Aortic atherosclerosis. Subcentimeter nodule within the right thyroid lobe, not meeting criteria for ultrasound follow-up based on size. No follow-up imaging is recommended. Reference: J Am Coll Radiol. 2015 Feb;12(2): 143-50. IMPRESSION: CT head: 1. No evidence of acute intracranial abnormality. 2. Ventriculomegaly is unchanged from the prior head CT of 09/09/2022. Stable position of a right frontoparietal approach ventricular catheter. 3. Redemonstrated chronic lacunar infarcts within the bilateral thalami. 4. Advanced chronic small vessel ischemic changes within the cerebral white matter. CT cervical spine: 1. No evidence of acute fracture to the cervical spine. 2. Nonspecific reversal of the expected cervical lordosis. 3. 2 mm C4-C5 grade 1 anterolisthesis. 4. Cervical spondylosis, as described. 5. 1.8 x 0.9 cm pulmonary nodule within the superior aspect of the left lower lobe, unchanged in size from the recent prior chest CT of 11/26/2022. This may reflect scarring or low-grade adenocarcinoma. Please refer to the prior chest CT for follow-up recommendations. 6. Aortic Atherosclerosis (ICD10-I70.0) and Emphysema (ICD10-J43.9). Electronically Signed   By: Jackey Loge D.O.   On: 12/16/2022 12:11   DG Chest 2 View  Result Date: 12/16/2022 CLINICAL DATA:  Provided history: Weakness, cough. EXAM: CHEST - 2 VIEW COMPARISON:  Prior chest radiographs 11/10/2022 and earlier. Chest CT 11/26/2022. FINDINGS: A ventriculoperitoneal shunt partially imaged ventriculoperitoneal shunt traversing the lower right neck, chest and upper abdomen. Cardiomediastinal silhouette is unchanged. Aortic atherosclerosis. Surgical clips along/within the left mediastinum. Postoperative changes from prior left upper lobectomy. A known 1.3 x 0.9 cm lesion within the superior aspect of the left lower lobe was better appreciated on the prior chest CT of 11/26/2022. Linear and ill-defined opacities at the left lung base  partial silhouetting of the left hemidiaphragm. Known emphysema, better delineated on the prior chest CT. No evidence of pleural effusion or pneumothorax. No acute bony abnormality identified. Partially imaged dextrocurvature of the lower thoracic and visualized upper lumbar spine. IMPRESSION: Known 1.3 x 0.9 cm pulmonary lesion within the superior aspect of the left lower lobe, better appreciated on the prior chest CT of 11/26/2022.  This may reflect scarring or low-grade adenocarcinoma. Please refer to the prior chest CT for follow-up recommendations. Linear and ill-defined opacities within the left lung base with partial silhouetting of the left hemidiaphragm. While this at least partially reflects atelectasis and scarring, left lower lobe pneumonia cannot be excluded. Aortic Atherosclerosis (ICD10-I70.0) and Emphysema (ICD10-J43.9). Electronically Signed   By: Jackey Loge D.O.   On: 12/16/2022 11:37      Impression / Plan:   Assessment: Principal Problem:   HCAP (healthcare-associated pneumonia) Active Problems:   Hyperlipidemia   Dementia without behavioral disturbance (HCC)   Essential hypertension   History of CVA (cerebrovascular accident)   Anxiety and depression   Protein-calorie malnutrition, severe   Normocytic anemia   COPD (chronic obstructive pulmonary disease) (HCC)   Dark stools   Diabetes mellitus without complication (HCC)   Fall at home, initial encounter   Amanda Porter is a 77 y.o. y/o adult with with a history of chronic anemia on iron.  The patient had a EGD and colonoscopy back in 2018.  The patient had a polyp in the colon at that time.  I spoke to the daughter who states that the patient has always had dark stools with iron but not as dark or soft as it has been now.  Plan:  The patient will be brought down to the endoscopy unit for a EGD to look for source of GI bleeding since the patient has been NPO.  I have discussed this with the daughter and she agrees to  the plan.  PPI IV twice daily  Continue serial CBCs and transfuse PRN Avoid NSAIDs Maintain 2 large-bore IV lines Please page GI with any acute hemodynamic changes, or signs of active GI bleeding   Thank you for involving me in the care of this patient.      LOS: 0 days   Amanda Minium, MD, [facility] 12/16/2022, 3:50 PM,  Pager [phone] 7am-5pm  Check AMION for 5pm -7am coverage and on weekends   Note: This dictation was prepared with Dragon dictation along with smaller phrase technology. Any transcriptional errors that result from this process are unintentional.

## 2022-12-16 NOTE — Transfer of Care (Signed)
Immediate Anesthesia Transfer of Care Note  Patient: Amanda Porter  Procedure(s) Performed: ESOPHAGOGASTRODUODENOSCOPY (EGD) WITH PROPOFOL  Patient Location: PACU  Anesthesia Type:General  Level of Consciousness: drowsy  Airway & Oxygen Therapy: Patient Spontanous Breathing and Patient connected to face mask oxygen  Post-op Assessment: Report given to RN and Post -op Vital signs reviewed and stable  Post vital signs: Reviewed and stable  Last Vitals:  Vitals Value Taken Time  BP    Temp    Pulse    Resp    SpO2      Last Pain:  Vitals:   12/16/22 1217  TempSrc:   PainSc: Asleep         Complications: No notable events documented.

## 2022-12-16 NOTE — Consult Note (Signed)
Pharmacy Antibiotic Note  Amanda Porter is a 77 y.o. adult with medical history including COPD / chronic respiratory failure on 3L O2 at baseline, dementia, history of CVA, anxiety / depression, DM, hydrocephalus s/p VP shunt admitted on 12/16/2022 with  unwitnessed fall, productive cough x 2 days, dark tarry stool . Pharmacy has been consulted for vancomycin and cefepime dosing.  Plan:  Cefepime 2 g IV q12h  Vancomycin 1 g IV q36h --Calculated AUC: 517, Cmin 10.1 --Daily Scr per protocol --Levels at steady state or as clinically indicated  Height: 5\' 2"  (157.5 cm) Weight: 45.4 kg (100 lb) IBW/kg (Calculated) : 50.1  Temp (24hrs), Avg:99.3 F (37.4 C), Min:99.3 F (37.4 C), Max:99.3 F (37.4 C)  Recent Labs  Lab 12/16/22 0953 12/16/22 1229  WBC 15.6*  --   CREATININE 0.69  --   LATICACIDVEN  --  0.8    Estimated Creatinine Clearance (by C-G formula based on SCr of 0.69 mg/dL) Female: 12/18/22 mL/min Female: 49.7 mL/min    Allergies  Allergen Reactions   Lexapro [Escitalopram Oxalate] Other (See Comments)    Dizziness, nausea, fatigue   Neosporin  [Neomycin-Polymyxin-Gramicidin] Rash    (Neosporin)   Neosporin [Bacitracin-Polymyxin B] Rash    Antimicrobials this admission: Ceftriaxone 1/16 x 1 Cefepime 1/16 >>  Vancomycin 1/16 >>   Dose adjustments this admission: N/A  Microbiology results: 1/16 BCx: Pending 1/16 MRSA PCR: Pending  Thank you for allowing pharmacy to be a part of this patient's care.  2/16 12/16/2022 1:47 PM

## 2022-12-16 NOTE — Anesthesia Postprocedure Evaluation (Signed)
Anesthesia Post Note  Patient: Amanda Porter  Procedure(s) Performed: ESOPHAGOGASTRODUODENOSCOPY (EGD) WITH PROPOFOL  Patient location during evaluation: Endoscopy Anesthesia Type: General Level of consciousness: awake and alert Pain management: pain level controlled Vital Signs Assessment: post-procedure vital signs reviewed and stable Respiratory status: spontaneous breathing, nonlabored ventilation, respiratory function stable and patient connected to nasal cannula oxygen Cardiovascular status: blood pressure returned to baseline and stable Postop Assessment: no apparent nausea or vomiting Anesthetic complications: no   No notable events documented.   Last Vitals:  Vitals:   12/16/22 1739 12/16/22 1749  BP: (!) 113/47 (!) 110/52  Pulse: 84 87  Resp: 15 18  Temp:    SpO2: 100% 100%    Last Pain:  Vitals:   12/16/22 1749  TempSrc:   PainSc: 0-No pain                 Corinda Gubler

## 2022-12-16 NOTE — Telephone Encounter (Signed)
Approval of verbal orders has been given to [place] from [employer]

## 2022-12-16 NOTE — ED Triage Notes (Signed)
Pt presents to the ED via POV due to multiple complaints. Pt had an unwitness fall this morning while in the bathroom. Daughter found mother on the floor covered in dark tarry stool. Pt has a hx of taking iron pills. Daughter also mentions patient is more weak than normal. Daughter is also c/o productive cough cough that started two days. Pt is confused at baseline and wears chronic oxygen 3L.

## 2022-12-16 NOTE — Anesthesia Preprocedure Evaluation (Signed)
Anesthesia Evaluation  Patient identified by MRN, date of birth, ID band Patient confused  General Assessment Comment:  Unwitnessed fall, covered in dark, tarry stool. Has had cough past two days, X-ray reads it as atelectasis/scarring vs pneumonia on left  Reviewed: Allergy & Precautions, NPO status , Patient's Chart, lab work & pertinent test results  History of Anesthesia Complications Negative for: history of anesthetic complications  Airway Mallampati: III  TM Distance: >3 FB Neck ROM: full    Dental  (+) Chipped, Poor Dentition, Missing, Dental Advidsory Given   Pulmonary neg shortness of breath, pneumonia, unresolved, COPD (3 L),  oxygen dependent, neg recent URI, former smoker Has had cough past two days, X-ray shows potential Left lower lobe pneumonia vs atelectasis/scarring. 3L/min O2 chronically.   Pulmonary exam normal        Cardiovascular Exercise Tolerance: Poor hypertension, (-) angina (-) Past MI Normal cardiovascular exam     Neuro/Psych neg Seizures PSYCHIATRIC DISORDERS Anxiety Depression   Dementia VP shunt for normal pressure hydrocephalus  CVA    GI/Hepatic Neg liver ROS,GERD  Controlled,,  Endo/Other  diabetes, Type 2    Renal/GU      Musculoskeletal   Abdominal   Peds  Hematology negative hematology ROS (+)   Anesthesia Other Findings Past Medical History: 11/05/2021: Acute respiratory failure requiring reintubation (HCC) No date: Anemia No date: Anxiety No date: Anxiety and depression No date: COPD (chronic obstructive pulmonary disease) (HCC) No date: CVA (cerebral vascular accident) (HCC) No date: CVA (cerebral vascular accident) (HCC)     Comment:  L sided deficits No date: Dementia (HCC) No date: Depression No date: Diabetes mellitus (HCC)     Comment:  Type 2 No date: Diabetes mellitus without complication (HCC) No date: Dysphagia No date: Dysphasia No date: GERD  (gastroesophageal reflux disease) No date: History of lung cancer No date: Hyperlipidemia No date: Hypertension 12/13/2021: Influenza A 11/20/2017: Memory disturbance 2018: Normal pressure hydrocephalus (HCC) 08/25/2022: Nose colonized with MRSA     Comment:  a.) noted on pre-surgical swab prior to VP shunt               revision No date: Retinal detachment     Comment:  Right 11/20/2017: Right carotid bruit 02/01/2020: Tardive dyskinesia No date: Tardive dyskinesia No date: Thrombosis     Comment:  Arterial to lower extremity?  Past Surgical History: 07/25/2022: IR FL GUIDED LOC OF NEEDLE/CATH TIP FOR SPINAL INJECTION  RT No date: LUNG REMOVAL, PARTIAL     Comment:  left upper lobe 12/09/2016: VENTRICULOPERITONEAL SHUNT 10/2018: VENTRICULOPERITONEAL SHUNT  BMI    Body Mass Index: 18.95 kg/m      Reproductive/Obstetrics negative OB ROS                              Anesthesia Physical Anesthesia Plan  ASA: 4  Anesthesia Plan: General   Post-op Pain Management:    Induction: Intravenous  PONV Risk Score and Plan: Ondansetron, Dexamethasone and Treatment may vary due to age or medical condition  Airway Management Planned: Natural Airway  Additional Equipment:   Intra-op Plan:   Post-operative Plan: Possible Post-op intubation/ventilation  Informed Consent: I have reviewed the patients History and Physical, chart, labs and discussed the procedure including the risks, benefits and alternatives for the proposed anesthesia with the patient or authorized representative who has indicated his/her understanding and acceptance.   Patient has DNR.  Discussed DNR with power of  attorney and Suspend DNR.   Dental Advisory Given  Plan Discussed with: Anesthesiologist, CRNA and Surgeon  Anesthesia Plan Comments: (History and phone consent from the patients daughter Anapaola Kinsel at [phone]   Daughter consented for risks of anesthesia  including but not limited to:  - adverse reactions to medications - damage to eyes, teeth, lips or other oral mucosa - nerve damage due to positioning  - sore throat or hoarseness - Damage to heart, brain, nerves, lungs, other parts of body or loss of life  Wishes to suspend DNR periprocedurally  She voiced understanding.)         Anesthesia Quick Evaluation

## 2022-12-16 NOTE — ED Notes (Signed)
Pt denies HA, CP, abdominal pain, hip pain or any other pain. Daughter at bedside states she believes pt slipped off toilet at house and was down for about an hour before she got to her. Reports pt had multiple episodes of black liquid stool. Pt denies feeling fatigued or chilly. Reports normally pt is A&Ox3-4 (usually states year is "82" per daughter). Pt knows self, place; pt disoriented to year ("stated 1975") and situation currently. Daughter states pt is much weaker than normal. History DM type 2. Resp reg/unlabored, skin dry and laying calmly on stretcher on 3L of home O2 via [place].

## 2022-12-16 NOTE — Progress Notes (Signed)
Patient refused Nebulizer treatment, NARD/SOB noted at this time.

## 2022-12-16 NOTE — Plan of Care (Signed)
  Problem: Education: Goal: Ability to describe self-care measures that may prevent or decrease complications (Diabetes Survival Skills Education) will improve Outcome: Progressing Goal: Individualized Educational Video(s) Outcome: Progressing   Problem: Coping: Goal: Ability to adjust to condition or change in health will improve Outcome: Progressing   Problem: Fluid Volume: Goal: Ability to maintain a balanced intake and output will improve Outcome: Progressing   Problem: Health Behavior/Discharge Planning: Goal: Ability to identify and utilize available resources and services will improve Outcome: Progressing Goal: Ability to manage health-related needs will improve Outcome: Progressing   Problem: Metabolic: Goal: Ability to maintain appropriate glucose levels will improve Outcome: Progressing   Problem: Nutritional: Goal: Maintenance of adequate nutrition will improve Outcome: Progressing Goal: Progress toward achieving an optimal weight will improve Outcome: Progressing   Problem: Skin Integrity: Goal: Risk for impaired skin integrity will decrease Outcome: Progressing   Problem: Tissue Perfusion: Goal: Adequacy of tissue perfusion will improve Outcome: Progressing   Problem: Activity: Goal: Ability to tolerate increased activity will improve Outcome: Progressing   Problem: Clinical Measurements: Goal: Ability to maintain a body temperature in the normal range will improve Outcome: Progressing   Problem: Respiratory: Goal: Ability to maintain adequate ventilation will improve Outcome: Progressing Goal: Ability to maintain a clear airway will improve Outcome: Progressing   Problem: Education: Goal: Knowledge of General Education information will improve Description: Including pain rating scale, medication(s)/side effects and non-pharmacologic comfort measures Outcome: Progressing   Problem: Health Behavior/Discharge Planning: Goal: Ability to manage  health-related needs will improve Outcome: Progressing   Problem: Clinical Measurements: Goal: Ability to maintain clinical measurements within normal limits will improve Outcome: Progressing Goal: Will remain free from infection Outcome: Progressing Goal: Diagnostic test results will improve Outcome: Progressing Goal: Respiratory complications will improve Outcome: Progressing Goal: Cardiovascular complication will be avoided Outcome: Progressing   Problem: Activity: Goal: Risk for activity intolerance will decrease Outcome: Progressing   Problem: Nutrition: Goal: Adequate nutrition will be maintained Outcome: Progressing   Problem: Coping: Goal: Level of anxiety will decrease Outcome: Progressing   Problem: Elimination: Goal: Will not experience complications related to bowel motility Outcome: Progressing Goal: Will not experience complications related to urinary retention Outcome: Progressing   Problem: Pain Managment: Goal: General experience of comfort will improve Outcome: Progressing   Problem: Safety: Goal: Ability to remain free from injury will improve Outcome: Progressing   Problem: Skin Integrity: Goal: Risk for impaired skin integrity will decrease Outcome: Progressing

## 2022-12-16 NOTE — Anesthesia Procedure Notes (Signed)
Date/Time: 12/16/2022 5:19 PM  Performed by: Joanette Gula, Darlyn Repsher, CRNAPre-anesthesia Checklist: Patient identified, Emergency Drugs available, Suction available, Patient being monitored and Timeout performed Patient Re-evaluated:Patient Re-evaluated prior to induction Oxygen Delivery Method: Simple face mask Induction Type: IV induction

## 2022-12-16 NOTE — H&P (Signed)
History and Physical    Amanda Porter RHN:[id number] DOB: [birth year] DOA: 12/16/2022  Referring Porter/Porter/PA:   PCP: Amanda Nest, Porter   Patient coming from:  The patient is coming from home.     Chief Complaint: cough, SOB, dark stool  HPI: Amanda Porter is a 77 y.o. adult with medical history significant of HTN, HLD, COPD on 3L O2, stroke, dementia, depression with anxiety, NPH (s/p of Vp shunt), lung cancer, SDH, who present with cough, SOB, fall and dark stool.   Pt has history of dementia and and more confused, cannot provide accurate medical history. Per her daughter (I called her daughter by phone), pt has cough and SOB for more than 2 days.  Patient has productive cough with thick yellow-colored sputum production.  Does not seem to have chest pain.  Patient has low-grade fever at home.  Patient has generalized weakness.  She fell this morning without significant injury. Pt has dark-colored loose stool bowel movement today.  Patient is taking iron supplement, never had dark stool before.  Daughter states that patient has dysuria and burning on urination, with increased urinary frequency recently. Patient does not have nausea vomiting or abdominal pain.  Patient moves all extremities.   Per her daughter, at her normal baseline, patient recognizes family members, most of the time orientated to place, but usually not orientated to time.  Patient is more confused than baseline per her daughter.  When I saw patient, she knows her own name, knows that she is in hospital, but confused about time.  She moves all extremities no facial droop or slurred speech.   Amanda Porter for GI is consulted due to dark stool.  EGD was performed today.  EGD by Amanda Porter of GI: - Normal esophagus.  - Normal stomach. -  A single non-bleeding angiodysplastic lesion in the duodenum. Treated with argon beam coagulation. -  Duodenal erosions with stigmata of recent bleeding. Treated with argon plasma coagulation  (APC). - No specimens collected.    Data reviewed independently and ED Course: pt was found to have Hgb 9.4 on 11/10/22 --> 9.2 --> 8.9, WBC 15.5, negative PCR for COVID, GFR> 60, temperature 99.3, blood pressure 126/67, heart rate 96, 89, RR 17, oxygen saturation 99% on home level 3 L oxygen.  Chest x-ray showed left basilar infiltration.  CT of head is negative for acute intracranial abnormalities.  CT of C-spine is negative for injury but showed lung nodule.  Patient is admitted to telemetry bed as inpatient.  EKG: I have personally reviewed.  Sinus rhythm, QTc 467, LAD.  Review of Systems: Could not reviewed accurately due to altered mental status and dementia    Allergy:  Allergies  Allergen Reactions   Lexapro [Escitalopram Oxalate] Other (See Comments)    Dizziness, nausea, fatigue   Neosporin  [Neomycin-Polymyxin-Gramicidin] Rash    (Neosporin)   Neosporin [Bacitracin-Polymyxin B] Rash    Past Medical History:  Diagnosis Date   Acute respiratory failure requiring reintubation (HCC) 11/05/2021   Anemia    Anxiety    Anxiety and depression    COPD (chronic obstructive pulmonary disease) (HCC)    CVA (cerebral vascular accident) (HCC)    CVA (cerebral vascular accident) (HCC)    L sided deficits   Dementia (HCC)    Depression    Diabetes mellitus (HCC)    Type 2   Diabetes mellitus without complication (HCC)    Dysphagia    Dysphasia    Falls  GERD (gastroesophageal reflux disease)    History of lung cancer    Hyperlipidemia    Hypertension    Influenza A 12/13/2021   Memory disturbance 11/20/2017   Normal pressure hydrocephalus ([place]) 2018   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    Past Surgical History:  Procedure Laterality Date    HIP ARTHROPLASTY Right 09/07/2022   Procedure: ARTHROPLASTY BIPOLAR HIP (HEMIARTHROPLASTY);  Surgeon: Amanda Sheehan, Porter;  Location: [facility] ORS;  Service: Orthopedics;  Laterality: Right;   IR FL GUIDED LOC OF NEEDLE/CATH TIP FOR SPINAL INJECTION RT  07/25/2022   LUNG REMOVAL, PARTIAL     left upper lobe   SHUNT REVISION VENTRICULAR-PERITONEAL N/A 09/19/2022   Procedure: VENTRICULOPERITONEAL SHUNT REVISION;  Surgeon: Amanda Maw, Porter;  Location: [facility] ORS;  Service: Neurosurgery;  Laterality: N/A;   VENTRICULOPERITONEAL SHUNT  12/09/2016   VENTRICULOPERITONEAL SHUNT  10/2018    Social History:  reports that Lee. Hargraves "[name]" quit smoking about 5 years ago. Amanda A. Verdone "[name]"'s smoking use included cigarettes. Amanda A. Peddy "Marlou Sa" has a 25.00 pack-year smoking history. Amanda A. Berntsen "Marlou Sa" has been exposed to tobacco smoke. Amanda A. Kahrs "Marlou Sa" has never used smokeless tobacco. Amanda A. Wiatrek "Marlou Sa" reports that Amanda A. Woodhams "Marlou Sa" does not currently use alcohol. Amanda A. Cu "Marlou Sa" reports that Amanda A. Sweetland "Marlou Sa" does not currently use drugs.  Family History:  Family History  Problem Relation Age of Onset   Pneumonia Mother    Cancer Father    Hyperlipidemia Sister    Hypertension Sister    Heart disease Sister    Diabetes Sister    Hypertension Sister    Diabetes Sister    Pancreatic cancer Sister    Throat cancer Brother    Cancer Brother    Cancer Brother    Breast cancer Neg Hx      Prior to Admission medications   Medication Sig Start Date End Date Taking? Authorizing Provider  albuterol (VENTOLIN HFA) 108 (90 Base) MCG/ACT inhaler INHALE 2 PUFFS BY MOUTH EVERY 4 HOURS AS NEEDED 07/11/22   Amanda Porter  Albuterol Sulfate 2.5 MG/0.5ML NEBU Inhale 2.5 mg into the lungs every 6 (six) hours as needed.    Provider, Historical, Porter  amLODipine (NORVASC) 2.5 MG tablet Take 1 tablet (2.5 mg total) by mouth daily. for blood pressure. 08/19/22   Amanda Porter  aspirin 81 MG chewable tablet Chew 1 tablet (81 mg total) by mouth daily. 09/13/22   Amanda Porter  Budeson-Glycopyrrol-Formoterol (BREZTRI AEROSPHERE) 160-9-4.8 MCG/ACT AERO Inhale 2 puffs into the lungs in the morning and at bedtime. 12/16/22   Amanda Porter  buPROPion (WELLBUTRIN XL) 300 MG 24 hr tablet Take 1 tablet (300 mg total) by mouth daily with breakfast. for anxiety and depression. 11/10/22   Amanda Porter  busPIRone (BUSPAR) 15 MG tablet Take 45 mg by mouth in the morning.    Provider, Historical, Porter  clonazePAM (KLONOPIN) 0.5 MG tablet Take 1 tablet (0.5 mg total) by mouth at bedtime. 09/12/22   Amanda Porter  conjugated estrogens (PREMARIN) vaginal cream Apply one pea-sized amount around the opening of the urethra daily for 2  weeks, then 3 times weekly moving forward. 08/20/22   Vaillancourt, Lelon Mast, PA-C  folic acid (FOLVITE) 1 MG tablet Take 1 tablet (1 mg total) by mouth daily. 12/13/21   Amanda Nest, Porter  glipiZIDE (GLUCOTROL XL) 2.5 MG 24 hr tablet TAKE 1 TABLET (2.5 MG TOTAL) BY MOUTH DAILY WITH BREAKFAST. FOR DIABETES. 08/04/22   Amanda Nest, Porter  memantine (NAMENDA) 5 MG tablet Take 1 tablet (5 mg total) by mouth 2 (two) times daily. For memory. 07/14/22   Amanda Nest, Porter  potassium chloride SA (KLOR-CON M) 20 MEQ tablet Take 1 tablet (20 mEq total) by mouth daily for 3 days. 11/11/22 11/14/22  Corena Herter, Porter  rosuvastatin (CRESTOR) 20 MG tablet TAKE 1 TABLET (20 MG TOTAL) BY MOUTH EVERY EVENING. FOR CHOLESTEROL. 07/13/22   Amanda Nest, Porter  Tiotropium Bromide-Olodaterol (STIOLTO RESPIMAT) 2.5-2.5 MCG/ACT AERS INHALE 2 PUFFS BY MOUTH INTO THE LUNGS DAILY 12/26/21   Waymon Budge, Porter    Physical Exam: Vitals:   12/16/22 1439 12/16/22 1730 12/16/22 1739 12/16/22 1749  BP: 134/76 (!) 107/51 (!) 113/47 (!) 110/52  Pulse: (!) 101 81 84 87  Resp:  14 15 18   Temp: 98.4 F (36.9 C) (!) 97 F (36.1 C)    TempSrc:  Temporal     SpO2: 93% 100% 100% 100%  Weight:      Height:       General: Not in acute distress.  Thin body habitus HEENT:       Eyes: PERRL, EOMI, no scleral icterus.       ENT: No discharge from the ears and nose Heme: No neck lymph node enlargement. Cardiac: S1/S2, RRR, No murmurs, No gallops or rubs. Respiratory: No rales, wheezing, rhonchi or rubs. GI: Soft, nondistended, nontender, no organomegaly, BS present. GU: No hematuria Ext: No pitting leg edema bilaterally. 1+DP/PT pulse bilaterally. Musculoskeletal: No joint deformities, No joint redness or warmth, no limitation of ROM in spin. Skin: No rashes.  Neuro: Confused, knows her own name, knows that she is in the hospital, not oriented to time, cranial nerves II-XII grossly intact, moves all extremities normally.  Psych: Patient is not psychotic, no suicidal or hemocidal ideation.  Labs on Admission: I have personally reviewed following labs and imaging studies  CBC: Recent Labs  Lab 12/16/22 0953 12/16/22 1500  WBC 15.6* 13.2*  HGB 9.2* 8.9*  HCT 30.4* 30.0*  MCV 88.1 87.7  PLT 347 360   Basic Metabolic Panel: Recent Labs  Lab 12/16/22 0953  NA 139  K 3.7  CL 101  CO2 30  GLUCOSE 159*  BUN 23  CREATININE 0.69  CALCIUM 8.9   GFR: Estimated Creatinine Clearance (by C-G formula based on SCr of 0.69 mg/dL) Female: 12/18/22 mL/min Female: 49.7 mL/min Liver Function Tests: No results for input(s): "AST", "ALT", "ALKPHOS", "BILITOT", "PROT", "ALBUMIN" in the last 168 hours. No results for input(s): "LIPASE", "AMYLASE" in the last 168 hours. No results for input(s): "AMMONIA" in the last 168 hours. Coagulation Profile: Recent Labs  Lab 12/16/22 1500  INR 1.2   Cardiac Enzymes: No results for input(s): "CKTOTAL", "CKMB", "CKMBINDEX", "TROPONINI" in the last 168 hours. BNP (last 3 results) Recent Labs    04/04/22 1025  PROBNP 13.0   HbA1C: No results for input(s): "HGBA1C" in the last 72 hours. CBG: Recent Labs   Lab 12/16/22 1053 12/16/22 1649 12/16/22 1708  GLUCAP 136* 96 95   Lipid Profile: No results for input(s): "CHOL", "HDL", "  [place]", "TRIG", "CHOLHDL", "LDLDIRECT" in the last 72 hours. Thyroid Function Tests: No results for input(s): "TSH", "T4TOTAL", "FREET4", "T3FREE", "THYROIDAB" in the last 72 hours. Anemia Panel: No results for input(s): "VITAMINB12", "FOLATE", "FERRITIN", "TIBC", "IRON", "RETICCTPCT" in the last 72 hours. Urine analysis:    Component Value Date/Time   COLORURINE YELLOW (A) 11/10/2022 1632   APPEARANCEUR HAZY (A) 11/10/2022 1632   APPEARANCEUR Clear 09/30/2022 1132   LABSPEC 1.026 11/10/2022 1632   PHURINE 5.0 11/10/2022 1632   GLUCOSEU 150 (A) 11/10/2022 1632   HGBUR NEGATIVE 11/10/2022 1632   BILIRUBINUR NEGATIVE 11/10/2022 1632   BILIRUBINUR Negative 09/30/2022 1132   KETONESUR NEGATIVE 11/10/2022 1632   PROTEINUR 30 (A) 11/10/2022 1632   UROBILINOGEN 0.2 06/06/2022 1543   NITRITE NEGATIVE 11/10/2022 1632   LEUKOCYTESUR TRACE (A) 11/10/2022 1632   Sepsis Labs: @LABRCNTIP (procalcitonin:4,lacticidven:4) ) Recent Results (from the past 240 hour(s))  Resp panel by RT-PCR (RSV, Flu A&B, Covid) Anterior Nasal Swab     Status: None   Collection Time: 12/16/22 10:44 AM   Specimen: Anterior Nasal Swab  Result Value Ref Range Status   SARS Coronavirus 2 by RT PCR NEGATIVE NEGATIVE Final    Comment: (NOTE) SARS-CoV-2 target nucleic acids are NOT DETECTED.  The SARS-CoV-2 RNA is generally detectable in upper respiratory specimens during the acute phase of infection. The lowest concentration of SARS-CoV-2 viral copies this assay can detect is 138 copies/mL. A negative result does not preclude SARS-Cov-2 infection and should not be used as the sole basis for treatment or other patient management decisions. A negative result may occur with  improper specimen collection/handling, submission of specimen other than nasopharyngeal swab, presence of viral  mutation(s) within the areas targeted by this assay, and inadequate number of viral copies(<138 copies/mL). A negative result must be combined with clinical observations, patient history, and epidemiological information. The expected result is Negative.  Fact Sheet for Patients:  EntrepreneurPulse.com.au  Fact Sheet for Healthcare Providers:  IncredibleEmployment.be  This test is no t yet approved or cleared by the [place] FDA and  has been authorized for detection and/or diagnosis of SARS-CoV-2 by FDA under an Emergency Use Authorization (EUA). This EUA will remain  in effect (meaning this test can be used) for the duration of the COVID-19 declaration under Section 564(b)(1) of the Act, 21 U.S.C.section 360bbb-3(b)(1), unless the authorization is terminated  or revoked sooner.       Influenza A by PCR NEGATIVE NEGATIVE Final   Influenza B by PCR NEGATIVE NEGATIVE Final    Comment: (NOTE) The Xpert Xpress SARS-CoV-2/FLU/RSV plus assay is intended as an aid in the diagnosis of influenza from Nasopharyngeal swab specimens and should not be used as a sole basis for treatment. Nasal washings and aspirates are unacceptable for Xpert Xpress SARS-CoV-2/FLU/RSV testing.  Fact Sheet for Patients: EntrepreneurPulse.com.au  Fact Sheet for Healthcare Providers: IncredibleEmployment.be  This test is not yet approved or cleared by the [place] FDA and has been authorized for detection and/or diagnosis of SARS-CoV-2 by FDA under an Emergency Use Authorization (EUA). This EUA will remain in effect (meaning this test can be used) for the duration of the COVID-19 declaration under Section 564(b)(1) of the Act, 21 U.S.C. section 360bbb-3(b)(1), unless the authorization is terminated or revoked.     Resp Syncytial Virus by PCR NEGATIVE NEGATIVE Final    Comment: (NOTE) Fact Sheet for  Patients: EntrepreneurPulse.com.au  Fact Sheet for Healthcare Providers: IncredibleEmployment.be  This test is not yet approved or cleared by  the [employer] and has been authorized for detection and/or diagnosis of SARS-CoV-2 by FDA under an Emergency Use Authorization (EUA). This EUA will remain in effect (meaning this test can be used) for the duration of the COVID-19 declaration under Section 564(b)(1) of the Act, 21 U.S.C. section 360bbb-3(b)(1), unless the authorization is terminated or revoked.  Performed at [facility], [address] [place], [place] [address]      Radiological Exams on Admission: CT Head Wo Contrast  Result Date: 12/16/2022 CLINICAL DATA:  Provided history: Fall.  Unwitnessed fall. EXAM: CT HEAD WITHOUT CONTRAST CT CERVICAL SPINE WITHOUT CONTRAST TECHNIQUE: Multidetector CT imaging of the head and cervical spine was performed following the standard protocol without intravenous contrast. Multiplanar CT image reconstructions of the cervical spine were also generated. RADIATION DOSE REDUCTION: This exam was performed according to the departmental dose-optimization program which includes automated exposure control, adjustment of the mA and/or kV according to patient size and/or use of iterative reconstruction technique. COMPARISON:  Prior head CT examinations 09/09/2022 and earlier. Cervical spine CT 09/06/2022. chest CT 11/26/2022. FINDINGS: CT HEAD FINDINGS Brain: Unchanged position of a right frontoparietal approach ventricular catheter which terminates within the right lateral ventricle (abutting the septum pellucidum). Ventriculomegaly is unchanged from the prior head CT of 09/09/2022. Redemonstrated chronic lacunar infarcts within the bilateral thalami. Advanced patchy and confluent hypoattenuation within the cerebral white matter, nonspecific but compatible with chronic small vessel images ease. There is no acute  intracranial hemorrhage. No demarcated cortical infarct. No extra-axial fluid collection. No evidence of an intracranial mass. No midline shift. Vascular: No hyperdense vessel.  Atherosclerotic calcifications. Skull: No fracture or aggressive osseous lesion. Sinuses/Orbits: No mass or acute finding within the imaged orbits. Trace fluid within the right sphenoid sinus. CT CERVICAL SPINE FINDINGS Alignment: Reversal of the expected cervical lordosis. 2 mm C4-C5 grade 1 anterolisthesis. Skull base and vertebrae: No fracture or aggressive osseous lesion. Soft tissues and spinal canal: No prevertebral fluid or swelling. No visible canal hematoma. Disc levels: Cervical spondylosis with multilevel disc space narrowing, disc bulges/central disc protrusions, posterior disc osteophyte complexes and uncovertebral hypertrophy. Disc space narrowing is greatest at C5-C6 and C6-C7 (advanced at these levels). No appreciable high-grade spinal canal stenosis. Multilevel bony neural foraminal narrowing. Multilevel ventral osteophytes. Upper chest: Emphysema. Prior left upper lobectomy. 1.8 x 0.9 cm pulmonary nodule within the superior aspect of the left lower lobe with somewhat spiculated margins, unchanged in size from the recent prior chest CT of 11/26/2022. Other: Aortic atherosclerosis. Subcentimeter nodule within the right thyroid lobe, not meeting criteria for ultrasound follow-up based on size. No follow-up imaging is recommended. Reference: J Am Coll Radiol. 2015 Feb;12(2): 143-50. IMPRESSION: CT head: 1. No evidence of acute intracranial abnormality. 2. Ventriculomegaly is unchanged from the prior head CT of 09/09/2022. Stable position of a right frontoparietal approach ventricular catheter. 3. Redemonstrated chronic lacunar infarcts within the bilateral thalami. 4. Advanced chronic small vessel ischemic changes within the cerebral white matter. CT cervical spine: 1. No evidence of acute fracture to the cervical spine. 2.  Nonspecific reversal of the expected cervical lordosis. 3. 2 mm C4-C5 grade 1 anterolisthesis. 4. Cervical spondylosis, as described. 5. 1.8 x 0.9 cm pulmonary nodule within the superior aspect of the left lower lobe, unchanged in size from the recent prior chest CT of 11/26/2022. This may reflect scarring or low-grade adenocarcinoma. Please refer to the prior chest CT for follow-up recommendations. 6. Aortic Atherosclerosis (ICD10-I70.0) and Emphysema (ICD10-J43.9). Electronically Signed  By: Jackey Loge D.O.   On: 12/16/2022 12:11   CT Cervical Spine Wo Contrast  Result Date: 12/16/2022 CLINICAL DATA:  Provided history: Fall.  Unwitnessed fall. EXAM: CT HEAD WITHOUT CONTRAST CT CERVICAL SPINE WITHOUT CONTRAST TECHNIQUE: Multidetector CT imaging of the head and cervical spine was performed following the standard protocol without intravenous contrast. Multiplanar CT image reconstructions of the cervical spine were also generated. RADIATION DOSE REDUCTION: This exam was performed according to the departmental dose-optimization program which includes automated exposure control, adjustment of the mA and/or kV according to patient size and/or use of iterative reconstruction technique. COMPARISON:  Prior head CT examinations 09/09/2022 and earlier. Cervical spine CT 09/06/2022. chest CT 11/26/2022. FINDINGS: CT HEAD FINDINGS Brain: Unchanged position of a right frontoparietal approach ventricular catheter which terminates within the right lateral ventricle (abutting the septum pellucidum). Ventriculomegaly is unchanged from the prior head CT of 09/09/2022. Redemonstrated chronic lacunar infarcts within the bilateral thalami. Advanced patchy and confluent hypoattenuation within the cerebral white matter, nonspecific but compatible with chronic small vessel images ease. There is no acute intracranial hemorrhage. No demarcated cortical infarct. No extra-axial fluid collection. No evidence of an intracranial mass. No  midline shift. Vascular: No hyperdense vessel.  Atherosclerotic calcifications. Skull: No fracture or aggressive osseous lesion. Sinuses/Orbits: No mass or acute finding within the imaged orbits. Trace fluid within the right sphenoid sinus. CT CERVICAL SPINE FINDINGS Alignment: Reversal of the expected cervical lordosis. 2 mm C4-C5 grade 1 anterolisthesis. Skull base and vertebrae: No fracture or aggressive osseous lesion. Soft tissues and spinal canal: No prevertebral fluid or swelling. No visible canal hematoma. Disc levels: Cervical spondylosis with multilevel disc space narrowing, disc bulges/central disc protrusions, posterior disc osteophyte complexes and uncovertebral hypertrophy. Disc space narrowing is greatest at C5-C6 and C6-C7 (advanced at these levels). No appreciable high-grade spinal canal stenosis. Multilevel bony neural foraminal narrowing. Multilevel ventral osteophytes. Upper chest: Emphysema. Prior left upper lobectomy. 1.8 x 0.9 cm pulmonary nodule within the superior aspect of the left lower lobe with somewhat spiculated margins, unchanged in size from the recent prior chest CT of 11/26/2022. Other: Aortic atherosclerosis. Subcentimeter nodule within the right thyroid lobe, not meeting criteria for ultrasound follow-up based on size. No follow-up imaging is recommended. Reference: J Am Coll Radiol. 2015 Feb;12(2): 143-50. IMPRESSION: CT head: 1. No evidence of acute intracranial abnormality. 2. Ventriculomegaly is unchanged from the prior head CT of 09/09/2022. Stable position of a right frontoparietal approach ventricular catheter. 3. Redemonstrated chronic lacunar infarcts within the bilateral thalami. 4. Advanced chronic small vessel ischemic changes within the cerebral white matter. CT cervical spine: 1. No evidence of acute fracture to the cervical spine. 2. Nonspecific reversal of the expected cervical lordosis. 3. 2 mm C4-C5 grade 1 anterolisthesis. 4. Cervical spondylosis, as described.  5. 1.8 x 0.9 cm pulmonary nodule within the superior aspect of the left lower lobe, unchanged in size from the recent prior chest CT of 11/26/2022. This may reflect scarring or low-grade adenocarcinoma. Please refer to the prior chest CT for follow-up recommendations. 6. Aortic Atherosclerosis (ICD10-I70.0) and Emphysema (ICD10-J43.9). Electronically Signed   By: Jackey Loge D.O.   On: 12/16/2022 12:11   DG Chest 2 View  Result Date: 12/16/2022 CLINICAL DATA:  Provided history: Weakness, cough. EXAM: CHEST - 2 VIEW COMPARISON:  Prior chest radiographs 11/10/2022 and earlier. Chest CT 11/26/2022. FINDINGS: A ventriculoperitoneal shunt partially imaged ventriculoperitoneal shunt traversing the lower right neck, chest and upper abdomen. Cardiomediastinal silhouette is unchanged. Aortic atherosclerosis.  Surgical clips along/within the left mediastinum. Postoperative changes from prior left upper lobectomy. A known 1.3 x 0.9 cm lesion within the superior aspect of the left lower lobe was better appreciated on the prior chest CT of 11/26/2022. Linear and ill-defined opacities at the left lung base partial silhouetting of the left hemidiaphragm. Known emphysema, better delineated on the prior chest CT. No evidence of pleural effusion or pneumothorax. No acute bony abnormality identified. Partially imaged dextrocurvature of the lower thoracic and visualized upper lumbar spine. IMPRESSION: Known 1.3 x 0.9 cm pulmonary lesion within the superior aspect of the left lower lobe, better appreciated on the prior chest CT of 11/26/2022. This may reflect scarring or low-grade adenocarcinoma. Please refer to the prior chest CT for follow-up recommendations. Linear and ill-defined opacities within the left lung base with partial silhouetting of the left hemidiaphragm. While this at least partially reflects atelectasis and scarring, left lower lobe pneumonia cannot be excluded. Aortic Atherosclerosis (ICD10-I70.0) and Emphysema  (ICD10-J43.9). Electronically Signed   By: Jackey Loge D.O.   On: 12/16/2022 11:37      Assessment/Plan Principal Problem:   HCAP (healthcare-associated pneumonia) Active Problems:   COPD (chronic obstructive pulmonary disease) (HCC)   Hyperlipidemia   Normocytic anemia   Dark stools   Essential hypertension   Anxiety and depression   Diabetes mellitus without complication (HCC)   History of CVA (cerebrovascular accident)   Fall at home, initial encounter   Dementia without behavioral disturbance (HCC)   Protein-calorie malnutrition, severe   Angiodysplasia of stomach and duodenum   Assessment and Plan:  HCAP (healthcare-associated pneumonia): pt is home level 3 L oxygen with 99% saturation.  Clinically not septic.  Lactic acid normal 0.8.  - Admit to tele bed as inpt - IV Vancomycin and cefepime (patient received 1 dose of Rocephin in ED) - Mucinex for cough  - Bronchodilators - Urine legionella and S. pneumococcal antigen - Follow up blood culture x2, sputum culture  COPD (chronic obstructive pulmonary disease) (HCC) -Bronchodilators  Hyperlipidemia: -Crestor  Normocytic anemia and dark stools: Hgb dropped from 9.4 -->9.2 --> 8.9. Amanda Porter of IG did EGD,  showed single small AVM with duodenal erosions. Pt has angiodysplasia of stomach. -check anemia panel -Iron supplement -Protonix 40 mg twice daily -hold ASA  Essential hypertension -Hold amlodipine due to risk of hypotension secondary to GI bleeding -IV hydralazine as needed  Anxiety and depression -Continue home medications  Diabetes mellitus without complication (HCC): Recent A1c 7.6, poorly controlled.  Patient taking glipizide -SSI  History of CVA (cerebrovascular accident) -Crestor -Hold aspirin due to GI bleeding  Fall at home, initial encounter -PT/OT -Fall precaution  Dementia without behavioral disturbance (HCC) -Namenda  Protein-calorie malnutrition, severe: Body weight 45.4 kg, BMI  18.29 -Ensure -Nutrition consult      DVT ppx: SCD  Code Status: DNR per her daughter  Family Communication:  Yes, patient's daughter  by phone  Disposition Plan:  Anticipate discharge back to previous environment  Consults called:  Amanda Porter of GI  Admission status and Level of care: Telemetry Medical:   as inpt      Dispo: The patient is from: Home              Anticipated d/c is to: Home              Anticipated d/c date is: 2 days              Patient currently is not medically stable to  d/c.    Severity of Illness:  The appropriate patient status for this patient is INPATIENT. Inpatient status is judged to be reasonable and necessary in order to provide the required intensity of service to ensure the patient's safety. The patient's presenting symptoms, physical exam findings, and initial radiographic and laboratory data in the context of their chronic comorbidities is felt to place them at high risk for further clinical deterioration. Furthermore, it is not anticipated that the patient will be medically stable for discharge from the hospital within 2 midnights of admission.   * I certify that at the point of admission it is my clinical judgment that the patient will require inpatient hospital care spanning beyond 2 midnights from the point of admission due to high intensity of service, high risk for further deterioration and high frequency of surveillance required.*       Date of Service 12/16/2022    Lorretta Harp [facility]   If 7PM-7AM, please contact night-coverage [email or web address] 12/16/2022, 6:23 PM Total by phone daughter by phone

## 2022-12-16 NOTE — ED Provider Notes (Signed)
[facility] Provider Note    Event Date/Time   First MD Initiated Contact with Patient 12/16/22 1040     (approximate)   History   Fall and Weakness   HPI  Amanda Porter is a 77 y.o. adult  who presents to the emergency department today because of concern for a fall.  Patient cannot give good history so history is primarily obtained from family at bedside. Patient had the fall while in the bathroom. It was unwitnessed. Patient told family that she felt week and helped herself off the toilet, however it is unknown if that is true. Family thinks patient was likely on the ground for about an hour before they got there. The patient has had productive cough over the past 2 days. Additionally the patient had episodes of black stool. Patient is on iron pills but family has not noticed stool like this in the past.    Physical Exam   Triage Vital Signs: ED Triage Vitals  Enc Vitals Group     BP 12/16/22 0940 103/87     Pulse Rate 12/16/22 0940 96     Resp 12/16/22 0940 17     Temp 12/16/22 0940 99.3 F (37.4 C)     Temp Source 12/16/22 0940 Oral     SpO2 12/16/22 0940 98 %     Weight 12/16/22 0941 100 lb (45.4 kg)     Height 12/16/22 0941 5\' 2"  (1.575 m)     Head Circumference --      Peak Flow --      Pain Score 12/16/22 0947 0     Pain Loc --      Pain Edu? --      Excl. in GC? --     Most recent vital signs: Vitals:   12/16/22 0940  BP: 103/87  Pulse: 96  Resp: 17  Temp: 99.3 F (37.4 C)  SpO2: 98%   General: Awake, alert, not completely oriented. CV:  Good peripheral perfusion. Regular rate and rhythm. Resp:  Normal effort. Lungs clear. Abd:  No distention.    ED Results / Procedures / Treatments   Labs (all labs ordered are listed, but only abnormal results are displayed) Labs Reviewed  BASIC METABOLIC PANEL - Abnormal; Notable for the following components:      Result Value   Glucose, Bld 159 (*)    All other components within  normal limits  CBC - Abnormal; Notable for the following components:   WBC 15.6 (*)    RBC 3.45 (*)    Hemoglobin 9.2 (*)    HCT 30.4 (*)    All other components within normal limits  CBG MONITORING, ED - Abnormal; Notable for the following components:   Glucose-Capillary 136 (*)    All other components within normal limits  RESP PANEL BY RT-PCR (RSV, FLU A&B, COVID)  RVPGX2  CULTURE, BLOOD (ROUTINE X 2)  CULTURE, BLOOD (ROUTINE X 2)  MRSA NEXT GEN BY PCR, NASAL  LACTIC ACID, PLASMA  URINALYSIS, ROUTINE W REFLEX MICROSCOPIC  LACTIC ACID, PLASMA  TROPONIN I (HIGH SENSITIVITY)     EKG  I, 12/18/22, attending physician, personally viewed and interpreted this EKG  EKG Time: 1000 Rate: 98 Rhythm: normal sinus rhythm Axis: normal Intervals: qtc 467 QRS: incomplete RBBB ST changes: no st elevation Impression: abnormal ekg   RADIOLOGY I independently interpreted and visualized the CXR. My interpretation: No pneumonia Radiology interpretation:  IMPRESSION:  Known 1.3 x 0.9 cm  pulmonary lesion within the superior aspect of  the left lower lobe, better appreciated on the prior chest CT of  11/26/2022. This may reflect scarring or low-grade adenocarcinoma.  Please refer to the prior chest CT for follow-up recommendations.    Linear and ill-defined opacities within the left lung base with  partial silhouetting of the left hemidiaphragm. While this at least  partially reflects atelectasis and scarring, left lower lobe  pneumonia cannot be excluded.    I independently interpreted and visualized the CT cervical spine/head. My interpretation: no intracranial bleed, no fracutre Radiology interpretation:  IMPRESSION:  CT head:    1. No evidence of acute intracranial abnormality.  2. Ventriculomegaly is unchanged from the prior head CT of  09/09/2022. Stable position of a right frontoparietal approach  ventricular catheter.  3. Redemonstrated chronic lacunar infarcts  within the bilateral  thalami.  4. Advanced chronic small vessel ischemic changes within the  cerebral white matter.    CT cervical spine:    1. No evidence of acute fracture to the cervical spine.  2. Nonspecific reversal of the expected cervical lordosis.  3. 2 mm C4-C5 grade 1 anterolisthesis.  4. Cervical spondylosis, as described.  5. 1.8 x 0.9 cm pulmonary nodule within the superior aspect of the  left lower lobe, unchanged in size from the recent prior chest CT of  11/26/2022. This may reflect scarring or low-grade adenocarcinoma.  Please refer to the prior chest CT for follow-up recommendations.  6. Aortic Atherosclerosis (ICD10-I70.0) and Emphysema (ICD10-J43.9).    PROCEDURES:  Critical Care performed: No  Procedures   MEDICATIONS ORDERED IN ED: Medications - No data to display   IMPRESSION / MDM / ASSESSMENT AND PLAN / ED COURSE  I reviewed the triage vital signs and the nursing notes.                              Differential diagnosis includes, but is not limited to, intracranial bleed, traumatic fracture, anemia, electrolyte abnormality, infection.  Patient's presentation is most consistent with acute presentation with potential threat to life or bodily function.  Patient presented to the emergency department today after an unwitnessed fall. No significant traumatic findings on exam. CT head/cervical spine without traumatic findings. Daughter has noticed increased weakness and confusion. Blood work with leukocytosis and cxr is concerning for pneumonia. Will start IVFs. No significant change from baseline hgb. Discussed with Dr. Clyde Lundborg with the hospitalist service who will plan on admission.     FINAL CLINICAL IMPRESSION(S) / ED DIAGNOSES   Final diagnoses:  Fall, initial encounter  Weakness  Pneumonia due to infectious organism, unspecified laterality, unspecified part of lung      Note:  This document was prepared using Dragon voice recognition software  and may include unintentional dictation errors.    Amanda Semen, MD 12/16/22 [phone]

## 2022-12-16 NOTE — ED Notes (Signed)
Pt to CT

## 2022-12-16 NOTE — Op Note (Signed)
[facility] Gastroenterology Patient Name: Amanda Porter Procedure Date: 12/16/2022 5:14 PM MRN: [id number] Account #: [id number] Date of Birth: [birth year] Admit Type: Inpatient Age: 77 Room: [facility] ENDO ROOM 4 Gender: Female Note Status: Finalized Instrument Name: Upper Endoscope [id number] Procedure:             Upper GI endoscopy Indications:           Melena Providers:             Midge Minium MD, MD Referring MD:          Doreene Nest (Referring MD) Medicines:             Propofol per Anesthesia Complications:         No immediate complications. Procedure:             Pre-Anesthesia Assessment:                        - Prior to the procedure, a History and Physical was                         performed, and patient medications and allergies were                         reviewed. The patient's tolerance of previous                         anesthesia was also reviewed. The risks and benefits                         of the procedure and the sedation options and risks                         were discussed with the patient. All questions were                         answered, and informed consent was obtained. Prior                         Anticoagulants: The patient has taken no anticoagulant                         or antiplatelet agents. ASA Grade Assessment: II - A                         patient with mild systemic disease. After reviewing                         the risks and benefits, the patient was deemed in                         satisfactory condition to undergo the procedure.                        After obtaining informed consent, the endoscope was                         passed under direct vision. Throughout the procedure,  the patient's blood pressure, pulse, and oxygen                         saturations were monitored continuously. The                         Endosonoscope was introduced through the mouth, and                          advanced to the second part of duodenum. The upper GI                         endoscopy was accomplished without difficulty. The                         patient tolerated the procedure well. Findings:      The examined esophagus was normal.      The entire examined stomach was normal.      A single 2 mm angiodysplastic lesion without bleeding was found in the       duodenal bulb. Coagulation for hemostasis using argon beam at 2       liters/minute and 30 watts was successful.      Multiple erosions with stigmata of recent bleeding were found in the       duodenal bulb. Coagulation for hemostasis using argon plasma at 2       liters/minute and 30 watts was successful. Impression:            - Normal esophagus.                        - Normal stomach.                        - A single non-bleeding angiodysplastic lesion in the                         duodenum. Treated with argon beam coagulation.                        - Duodenal erosions with stigmata of recent bleeding.                         Treated with argon plasma coagulation (APC).                        - No specimens collected. Recommendation:        - Return patient to hospital ward for ongoing care.                        - Clear liquid diet.                        - Continue present medications. Procedure Code(s):     --- Professional ---                        [id number], Esophagogastroduodenoscopy, flexible,                         transoral; with control of bleeding, any method Diagnosis  Code(s):     --- Professional ---                        K92.1, Melena (includes Hematochezia)                        K31.819, Angiodysplasia of stomach and duodenum                         without bleeding                        K26.4, Chronic or unspecified duodenal ulcer with                         hemorrhage CPT copyright 2022 American Medical Association. All rights reserved. The codes documented in this report are preliminary  and upon coder review may  be revised to meet current compliance requirements. Midge Minium MD, MD 12/16/2022 5:29:10 PM This report has been signed electronically. Number of Addenda: 0 Note Initiated On: 12/16/2022 5:14 PM Estimated Blood Loss:  Estimated blood loss: none.      [facility]

## 2022-12-16 NOTE — Addendum Note (Signed)
Addended by: Erick Blinks A on: 12/16/2022 11:12 AM   Modules accepted: Orders

## 2022-12-17 ENCOUNTER — Encounter: Payer: Self-pay | Admitting: Gastroenterology

## 2022-12-17 DIAGNOSIS — J189 Pneumonia, unspecified organism: Secondary | ICD-10-CM | POA: Diagnosis not present

## 2022-12-17 LAB — CBC
HCT: 25.6 % — ABNORMAL LOW (ref 36.0–46.0)
HCT: 26.9 % — ABNORMAL LOW (ref 36.0–46.0)
HCT: 28.3 % — ABNORMAL LOW (ref 36.0–46.0)
Hemoglobin: 7.7 g/dL — ABNORMAL LOW (ref 12.0–15.0)
Hemoglobin: 8 g/dL — ABNORMAL LOW (ref 12.0–15.0)
Hemoglobin: 8.5 g/dL — ABNORMAL LOW (ref 12.0–15.0)
MCH: 26.3 pg (ref 26.0–34.0)
MCH: 26.3 pg (ref 26.0–34.0)
MCH: 26.5 pg (ref 26.0–34.0)
MCHC: 30.1 g/dL (ref 30.0–36.0)
MCHC: 29.7 g/dL — ABNORMAL LOW (ref 30.0–36.0)
MCHC: 30 g/dL (ref 30.0–36.0)
MCV: 87.4 fL (ref 80.0–100.0)
MCV: 88.5 fL (ref 80.0–100.0)
MCV: 88.2 fL (ref 80.0–100.0)
Platelets: 291 10*3/uL (ref 150–400)
Platelets: 299 10*3/uL (ref 150–400)
Platelets: 306 10*3/uL (ref 150–400)
RBC: 2.93 MIL/uL — ABNORMAL LOW (ref 3.87–5.11)
RBC: 3.04 MIL/uL — ABNORMAL LOW (ref 3.87–5.11)
RBC: 3.21 MIL/uL — ABNORMAL LOW (ref 3.87–5.11)
RDW: 15.4 % (ref 11.5–15.5)
RDW: 15.1 % (ref 11.5–15.5)
RDW: 14.8 % (ref 11.5–15.5)
WBC: 9.9 10*3/uL (ref 4.0–10.5)
WBC: 8.7 10*3/uL (ref 4.0–10.5)
WBC: 10 10*3/uL (ref 4.0–10.5)
nRBC: 0 % (ref 0.0–0.2)
nRBC: 0 % (ref 0.0–0.2)
nRBC: 0 % (ref 0.0–0.2)

## 2022-12-17 LAB — BASIC METABOLIC PANEL
Anion gap: 7 (ref 5–15)
BUN: 19 mg/dL (ref 8–23)
CO2: 29 mmol/L (ref 22–32)
Calcium: 8.5 mg/dL — ABNORMAL LOW (ref 8.9–10.3)
Chloride: 103 mmol/L (ref 98–111)
Creatinine, Ser: 0.59 mg/dL (ref 0.44–1.00)
GFR, Estimated: >60 mL/min (ref >60)
Glucose, Bld: 74 mg/dL (ref 70–99)
Potassium: 3.5 mmol/L (ref 3.5–5.1)
Sodium: 139 mmol/L (ref 135–145)

## 2022-12-17 LAB — GLUCOSE, CAPILLARY
Glucose-Capillary: 84 mg/dL (ref 70–99)
Glucose-Capillary: 113 mg/dL — ABNORMAL HIGH (ref 70–99)
Glucose-Capillary: 101 mg/dL — ABNORMAL HIGH (ref 70–99)

## 2022-12-17 LAB — VITAMIN B12: Vitamin B-12: 978 pg/mL — ABNORMAL HIGH (ref 180–914)

## 2022-12-17 MED ORDER — IPRATROPIUM-ALBUTEROL 0.5-2.5 (3) MG/3ML IN SOLN
3.0000 mL | Freq: Three times a day (TID) | RESPIRATORY_TRACT | Status: DC
  Administered 2022-12-17 – 2022-12-18 (×2): 3 mL via RESPIRATORY_TRACT
  Filled 2022-12-17 (×4): qty 3

## 2022-12-17 MED ORDER — ACETAMINOPHEN 325 MG PO TABS
650.0000 mg | ORAL_TABLET | Freq: Four times a day (QID) | ORAL | Status: DC | PRN
  Administered 2022-12-17 – 2022-12-18 (×2): 650 mg via ORAL
  Filled 2022-12-17: qty 2

## 2022-12-17 MED ORDER — PANTOPRAZOLE SODIUM 40 MG PO TBEC
40.0000 mg | DELAYED_RELEASE_TABLET | Freq: Every day | ORAL | Status: DC
  Administered 2022-12-18: 40 mg via ORAL
  Filled 2022-12-17: qty 1

## 2022-12-17 MED ORDER — PANTOPRAZOLE SODIUM 40 MG IV SOLR
40.0000 mg | Freq: Two times a day (BID) | INTRAVENOUS | Status: AC
  Administered 2022-12-17: 40 mg via INTRAVENOUS
  Filled 2022-12-17: qty 10

## 2022-12-17 MED ORDER — VITAMIN C 500 MG PO TABS
500.0000 mg | ORAL_TABLET | Freq: Every day | ORAL | Status: DC
  Administered 2022-12-17 – 2022-12-18 (×2): 500 mg via ORAL
  Filled 2022-12-17 (×2): qty 1

## 2022-12-17 MED ORDER — ADULT MULTIVITAMIN W/MINERALS CH
1.0000 | ORAL_TABLET | Freq: Every day | ORAL | Status: DC
  Administered 2022-12-17 – 2022-12-18 (×2): 1 via ORAL
  Filled 2022-12-17 (×2): qty 1

## 2022-12-17 NOTE — Progress Notes (Signed)
The patient had an upper endoscopy with an AVM and multiple duodenal erosion found yesterday.  The patient's hemoglobin is better today than it was yesterday.  The patient had no sign of any active bleeding or recent bleeding in the stomach or duodenum.  The AVM was treated with coagulation.  Nothing further to do from a GI point of view at this time with the patient's hemoglobin improving.  I would recommend continuing a PPI and advance diet as tolerated.  I will sign off.  Please call if any further GI concerns or questions.  We would like to thank you for the opportunity to participate in the care of Amanda Porter.

## 2022-12-17 NOTE — Evaluation (Signed)
Physical Therapy Evaluation Patient Details Name: Amanda Porter MRN: [id number] DOB: [birth year] Today's Date: 12/17/2022  History of Present Illness  Amanda Porter is a 77 y.o. adult with medical history significant of HTN, HLD, COPD on 3L O2, stroke, dementia, depression with anxiety, NPH (s/p of Vp shunt), lung cancer, SDH, who present with cough, SOB, fall and dark stool.  Clinical Impression  Patient received in recliner. Agrees to PT session, reports she is just hungry. She requires min guard/min A to stand from low recliner. Ambulated 25 feet with RW and 2l O2 with sats at 91% upon returning to chair. Patient's O2 bumped up to 3 liters as this is what chart review reports she is on at home. Patient will continue to benefit from skilled PT while here to improve functional independence and strength.          Recommendations for follow up therapy are one component of a multi-disciplinary discharge planning process, led by the attending physician.  Recommendations may be updated based on patient status, additional functional criteria and insurance authorization.  Follow Up Recommendations Home health PT      Assistance Recommended at Discharge Frequent or constant Supervision/Assistance  Patient can return home with the following  A little help with walking and/or transfers;A little help with bathing/dressing/bathroom;Help with stairs or ramp for entrance;Direct supervision/assist for medications management;Assistance with cooking/housework    Equipment Recommendations None recommended by PT  Recommendations for Other Services       Functional Status Assessment Patient has had a recent decline in their functional status and demonstrates the ability to make significant improvements in function in a reasonable and predictable amount of time.     Precautions / Restrictions Precautions Precautions: Fall Restrictions Weight Bearing Restrictions: No      Mobility  Bed Mobility                General bed mobility comments: NT, patient in recliner and remained in recliner    Transfers Overall transfer level: Needs assistance Equipment used: Rolling walker (2 wheels) Transfers: Sit to/from Stand Sit to Stand: Min guard           General transfer comment: increased effort to stand    Ambulation/Gait Ambulation/Gait assistance: Min guard Gait Distance (Feet): 25 Feet Assistive device: Rolling walker (2 wheels) Gait Pattern/deviations: Step-through pattern Gait velocity: decr     General Gait Details: no overt lob, ambulating well. I would guess she is close to baseline mobility  Stairs            Wheelchair Mobility    Modified Rankin (Stroke Patients Only)       Balance Overall balance assessment: Modified Independent, Needs assistance Sitting-balance support: Feet supported Sitting balance-Leahy Scale: Good     Standing balance support: Bilateral upper extremity supported, During functional activity, Reliant on assistive device for balance Standing balance-Leahy Scale: Fair                               Pertinent Vitals/Pain      Home Living Family/patient expects to be discharged to:: Private residence Living Arrangements: Children Available Help at Discharge: Family;Available PRN/intermittently Type of Home: House Home Access: Level entry       Home Layout: One level Home Equipment: Rollator (4 wheels);Shower seat;Grab bars - tub/shower Additional Comments: PLOF/house set up from chart, pt is poor historian    Prior Function Prior Level of Function : Patient  poor historian/Family not available             Mobility Comments: Pt states she comes from home, patient states she goes to daycare when daughter at work. ADLs Comments: Pt reports assit required for ADL/IADL     Hand Dominance        Extremity/Trunk Assessment   Upper Extremity Assessment Upper Extremity Assessment: Defer to OT  evaluation    Lower Extremity Assessment Lower Extremity Assessment: Overall WFL for tasks assessed    Cervical / Trunk Assessment Cervical / Trunk Assessment: Normal  Communication   Communication: No difficulties  Cognition Arousal/Alertness: Awake/alert Behavior During Therapy: WFL for tasks assessed/performed Overall Cognitive Status: History of cognitive impairments - at baseline                                          General Comments      Exercises     Assessment/Plan    PT Assessment Patient needs continued PT services  PT Problem List Decreased strength;Decreased activity tolerance;Decreased mobility;Decreased cognition       PT Treatment Interventions Therapeutic exercise;Gait training;Stair training;Functional mobility training;Therapeutic activities;Patient/family education    PT Goals (Current goals can be found in the Care Plan section)  Acute Rehab PT Goals Patient Stated Goal: to get better PT Goal Formulation: With patient Time For Goal Achievement: 12/31/22 Potential to Achieve Goals: Good    Frequency Min 2X/week     Co-evaluation               AM-PAC PT "6 Clicks" Mobility  Outcome Measure Help needed turning from your back to your side while in a flat bed without using bedrails?: None Help needed moving from lying on your back to sitting on the side of a flat bed without using bedrails?: None Help needed moving to and from a bed to a chair (including a wheelchair)?: A Little Help needed standing up from a chair using your arms (e.g., wheelchair or bedside chair)?: A Little Help needed to walk in hospital room?: A Little Help needed climbing 3-5 steps with a railing? : A Little 6 Click Score: 20    End of Session Equipment Utilized During Treatment: Gait belt;Oxygen Activity Tolerance: Patient tolerated treatment well Patient left: in chair;with chair alarm set Nurse Communication: Mobility status PT Visit  Diagnosis: Muscle weakness (generalized) (M62.81)    Time: 0266-9167 PT Time Calculation (min) (ACUTE ONLY): 24 min   Charges:   PT Evaluation $PT Eval Moderate Complexity: 1 Mod PT Treatments $Gait Training: 8-22 mins        Undine Nealis, PT, GCS 12/17/22,10:44 AM

## 2022-12-17 NOTE — Plan of Care (Signed)
  Problem: Skin Integrity: Goal: Risk for impaired skin integrity will decrease Outcome: Progressing   Problem: Respiratory: Goal: Ability to maintain adequate ventilation will improve Outcome: Progressing   Problem: Activity: Goal: Risk for activity intolerance will decrease Outcome: Progressing

## 2022-12-17 NOTE — Evaluation (Signed)
Occupational Therapy Evaluation Patient Details Name: LAYAH SKOUSEN MRN: [id number] DOB: [birth year] Today's Date: 12/17/2022   History of Present Illness Laurence A Chap is a 77 y.o. adult with medical history significant of HTN, HLD, COPD on 3L O2, stroke, dementia, depression with anxiety, NPH (s/p of Vp shunt), lung cancer, SDH, who present with cough, SOB, fall and dark stool.   Clinical Impression   Patient presenting with decreased Ind in self care,balance, functional mobility/transfers, endurance, and safety awareness. Patient reports living at home with family who assist her with self care as needed and she ambulates with RW at baseline. No family present to confirm. Patient currently functioning at min guard - min A for bed mobility and sit<>stand from EOB. Pt taking several steps over to recliner chair and seated to eat breakfast. Pt asking for soild food and OT explaining to pt why she is in hospital and on specific liquid diet. OT handing pt cup of jello with spoon in it and she attempts to drink from spoon like a straw with cuing to pick up and feed herself. Call bell and all needed items within reach upon exiting the room. Patient will benefit from acute OT to increase overall independence in the areas of ADLs, functional mobility, and safety awareness in order to safely discharge home with family support.      Recommendations for follow up therapy are one component of a multi-disciplinary discharge planning process, led by the attending physician.  Recommendations may be updated based on patient status, additional functional criteria and insurance authorization.   Follow Up Recommendations  Home health OT     Assistance Recommended at Discharge Intermittent Supervision/Assistance  Patient can return home with the following A little help with walking and/or transfers;A little help with bathing/dressing/bathroom;Help with stairs or ramp for entrance;Assist for transportation;Assistance  with cooking/housework;Direct supervision/assist for financial management;Direct supervision/assist for medications management    Functional Status Assessment  Patient has had a recent decline in their functional status and demonstrates the ability to make significant improvements in function in a reasonable and predictable amount of time.  Equipment Recommendations  None recommended by OT       Precautions / Restrictions Precautions Precautions: Fall Restrictions Weight Bearing Restrictions: No      Mobility Bed Mobility Overal bed mobility: Needs Assistance Bed Mobility: Supine to Sit     Supine to sit: Supervision          Transfers Overall transfer level: Needs assistance Equipment used: Rolling walker (2 wheels) Transfers: Sit to/from Stand Sit to Stand: Min guard                  Balance Overall balance assessment: Modified Independent, Needs assistance Sitting-balance support: Feet supported Sitting balance-Leahy Scale: Good     Standing balance support: Bilateral upper extremity supported, During functional activity, Reliant on assistive device for balance Standing balance-Leahy Scale: Fair                             ADL either performed or assessed with clinical judgement   ADL Overall ADL's : Needs assistance/impaired                     Lower Body Dressing: Min guard;Sit to/from stand   Toilet Transfer: Min guard;Stand-pivot;Rolling walker (2 wheels) Toilet Transfer Details (indicate cue type and reason): simulated  Vision Patient Visual Report: No change from baseline              Pertinent Vitals/Pain Pain Assessment Pain Assessment: No/denies pain     Hand Dominance Right   Extremity/Trunk Assessment Upper Extremity Assessment Upper Extremity Assessment: Overall WFL for tasks assessed   Lower Extremity Assessment Lower Extremity Assessment: Overall WFL for tasks assessed   Cervical  / Trunk Assessment Cervical / Trunk Assessment: Normal   Communication Communication Communication: No difficulties   Cognition Arousal/Alertness: Awake/alert Behavior During Therapy: WFL for tasks assessed/performed Overall Cognitive Status: History of cognitive impairments - at baseline                                                  Home Living Family/patient expects to be discharged to:: Private residence Living Arrangements: Children Available Help at Discharge: Family;Available PRN/intermittently Type of Home: House Home Access: Level entry     Home Layout: One level     Bathroom Shower/Tub: Chief Strategy Officer: Standard Bathroom Accessibility: Yes   Home Equipment: Rollator (4 wheels);Shower seat;Grab bars - tub/shower   Additional Comments: PLOF/house set up from chart, pt is poor historian      Prior Functioning/Environment Prior Level of Function : Patient poor historian/Family not available             Mobility Comments: Pt states she comes from home, patient states she goes to daycare when daughter at work. ADLs Comments: Pt reports assit required for ADL/IADL        OT Problem List: Decreased strength;Decreased activity tolerance;Impaired balance (sitting and/or standing);Decreased safety awareness;Decreased knowledge of use of DME or AE      OT Treatment/Interventions: Self-care/ADL training;Therapeutic exercise;Therapeutic activities;Manual therapy;Balance training;Patient/family education    OT Goals(Current goals can be found in the care plan section) Acute Rehab OT Goals Patient Stated Goal: to feel better and get real food OT Goal Formulation: With patient Time For Goal Achievement: 12/31/22 Potential to Achieve Goals: Good ADL Goals Pt Will Perform Grooming: with supervision;standing Pt Will Perform Lower Body Dressing: with supervision;sit to/from stand Pt Will Transfer to Toilet: with  supervision;ambulating Pt Will Perform Toileting - Clothing Manipulation and hygiene: with supervision;sit to/from stand  OT Frequency: Min 2X/week       AM-PAC OT "6 Clicks" Daily Activity     Outcome Measure Help from another person eating meals?: A Little Help from another person taking care of personal grooming?: A Little Help from another person toileting, which includes using toliet, bedpan, or urinal?: A Little Help from another person bathing (including washing, rinsing, drying)?: A Little Help from another person to put on and taking off regular upper body clothing?: A Little Help from another person to put on and taking off regular lower body clothing?: A Little 6 Click Score: 18   End of Session Nurse Communication: Mobility status  Activity Tolerance: Patient tolerated treatment well Patient left: in bed;with call bell/phone within reach;with bed alarm set  OT Visit Diagnosis: Unsteadiness on feet (R26.81);Repeated falls (R29.6);Muscle weakness (generalized) (M62.81)                Time: 7279-8223 OT Time Calculation (min): 15 min Charges:  OT General Charges $OT Visit: 1 Visit OT Evaluation $OT Eval Moderate Complexity: 1 Mod OT Treatments $Therapeutic Activity: 8-22 mins  Jackquline Denmark, MS,  OTR/L , CBIS ascom [phone]  12/17/22, 2:55 PM

## 2022-12-17 NOTE — TOC Progression Note (Signed)
Transition of Care [facility]) - Progression Note    Patient Details  Name: Amanda Porter MRN: [id number] Date of Birth: 05-25-1946  Transition of Care [facility]) CM/SW Contact  Marlowe Sax, RN Phone Number: 12/17/2022, 11:27 AM  Clinical Narrative:    Spoke with [name] the patient's daughter She is open with [name], she has a rollator at home and hates a Agricultural consultant, she has oxygen at home She uses [employer] for Oxygen her daughter takes her to appointments  Her daughter stated no additional needs   Expected Discharge Plan: Home w Home Health Services Barriers to Discharge: No Barriers Identified  Expected Discharge Plan and Services   Discharge Planning Services: CM Consult   Living arrangements for the past 2 months: Single Family Home                   DME Agency: NA                   Social Determinants of Health (SDOH) Interventions SDOH Screenings   Food Insecurity: No Food Insecurity (10/20/2022)  Housing: Low Risk  (10/20/2022)  Transportation Needs: No Transportation Needs (10/20/2022)  Utilities: Not At Risk (10/20/2022)  Depression (PHQ2-9): Low Risk  (10/20/2022)  Financial Resource Strain: Low Risk  (10/20/2022)  Stress: No Stress Concern Present (10/20/2022)  Tobacco Use: Medium Risk (12/17/2022)    Readmission Risk Interventions     No data to display

## 2022-12-17 NOTE — Progress Notes (Signed)
Initial Nutrition Assessment  DOCUMENTATION CODES:   Underweight, Non-severe (moderate) malnutrition in context of chronic illness  INTERVENTION:   -Advance diet to regular for widest variety of meal selections; discussed with MD who gave permission to advance diet -MVI with minerals daily -Ensure Enlive po BID, each supplement provides 350 kcal and 20 grams of protein  NUTRITION DIAGNOSIS:   Moderate Malnutrition related to chronic illness (dementia) as evidenced by mild fat depletion, moderate fat depletion, mild muscle depletion, moderate muscle depletion.  GOAL:   Patient will meet greater than or equal to 90% of their needs  MONITOR:   PO intake, Supplement acceptance, Diet advancement  REASON FOR ASSESSMENT:   Consult Assessment of nutrition requirement/status  ASSESSMENT:   Pt with medical history significant of HTN, HLD, COPD on 3L O2, stroke, dementia, depression with anxiety, NPH (s/p of Vp shunt), lung cancer, SDH, who present with cough, SOB, fall and dark stool.  Pt admitted with HCAP and upper GIB due to AVM.   1/16- s/p EGD- revealed single non-bleeding angiodysplastic lesion in the duodenum (treated with argon beam coagulation), duodenal erosions with stigmata of recent bleeding (treated with argon plasma coagulation)  Reviewed I/O's: +499 ml x 24 hours  UOP: 0 ml x 24 hours  Spoke with pt, who was sitting in recliner chair at time of visit. Pt consumed 100% of clear liquid diet. Pt complains of being hungry due to limitations of clear liquid diet. Pt reports she typically has a good appetite, consuming 3 meals per day.  Pt reports she typically consumes eggs and toast for breakfast, leftovers for lunch, and a meat, starch, and vegetable for dinner. Per pt, she lives with her sister and attends a day program 3 times per week and brings her lunch when she attends.   Pt denies any weight loss. Reviewed wt hx; pt has experienced a 4.6% wt loss over the past 3  months, which is not significant for time frame.   Discussed importance of good meal and supplement intake to promote healing. Pt amenable to supplements.   Case discussed with MD and GI; received permission to advance diet to solid foods.   Therapies recommending home with home health services.   Medications reviewed and include vitamin C, ferrous sulfate, and melatonin.   Lab Results  Component Value Date   HGBA1C 7.6 (H) 04/04/2022   PTA DM medications are 2.5 mg glipizide daily. Per ADA's Standards of Medical Care of Diabetes, glycemic targets for older adults who have multiple co-morbidities, cognitive impairments, and functional dependence should be less stringent (Hgb A1c <8.0-8.5).    Labs reviewed: CBGS: 84-113 (inpatient orders for glycemic control are 0-9 units insulin aspart daily with meals).    NUTRITION - FOCUSED PHYSICAL EXAM:  Flowsheet Row Most Recent Value  Orbital Region Mild depletion  Upper Arm Region Moderate depletion  Thoracic and Lumbar Region No depletion  Buccal Region No depletion  Temple Region Mild depletion  Clavicle Bone Region Moderate depletion  Clavicle and Acromion Bone Region Moderate depletion  Scapular Bone Region Moderate depletion  Dorsal Hand Moderate depletion  Patellar Region Moderate depletion  Anterior Thigh Region Moderate depletion  Posterior Calf Region Moderate depletion  Edema (RD Assessment) None  Hair Reviewed  Eyes Reviewed  Mouth Reviewed  Skin Reviewed  Nails Reviewed       Diet Order:   Diet Order             Diet regular Room service appropriate? Yes; Fluid consistency:  Thin  Diet effective now                   EDUCATION NEEDS:   Education needs have been addressed  Skin:  Skin Assessment: Reviewed RN Assessment  Last BM:  12/16/22  Height:   Ht Readings from Last 1 Encounters:  12/16/22 5\' 2"  (1.575 m)    Weight:   Wt Readings from Last 1 Encounters:  12/16/22 45.4 kg    Ideal Body  Weight:  50 kg  BMI:  Body mass index is 18.29 kg/m.  Estimated Nutritional Needs:   Kcal:  1400-1600  Protein:  65-80 grams  Fluid:  > 1.4 L    12/18/22, RD, LDN, CDCES Registered Dietitian II Certified Diabetes Care and Education Specialist Please refer to [facility] for RD and/or RD on-call/weekend/after hours pager

## 2022-12-17 NOTE — TOC Progression Note (Addendum)
Transition of Care [facility]) - Progression Note    Patient Details  Name: Amanda Porter MRN: [id number] Date of Birth: 12/18/1945  Transition of Care [facility]) CM/SW Contact  Marlowe Sax, RN Phone Number: 12/17/2022, 11:00 AM  Clinical Narrative:   Called to speak to Morrie Sheldon her daughter at [phone], no answer, I left a general VM asking for a call back, the patient went to [facility] and rehab 09/23/22, Pt saw the patient and recommends [facility] , she has a rollator and is on 3 liters o2 at home  She was seen by [facility] in Nov 2023 I reached out to [facility] to inquire if they are still seeing the patient, awaiting to hear back        Expected Discharge Plan and Services                                               Social Determinants of Health (SDOH) Interventions SDOH Screenings   Food Insecurity: No Food Insecurity (10/20/2022)  Housing: Low Risk  (10/20/2022)  Transportation Needs: No Transportation Needs (10/20/2022)  Utilities: Not At Risk (10/20/2022)  Depression (PHQ2-9): Low Risk  (10/20/2022)  Financial Resource Strain: Low Risk  (10/20/2022)  Stress: No Stress Concern Present (10/20/2022)  Tobacco Use: Medium Risk (12/17/2022)    Readmission Risk Interventions     No data to display

## 2022-12-17 NOTE — Progress Notes (Signed)
[facility] Progress Note  Patient: Amanda Porter    YNW:[id number]  DOA: 12/16/2022     Date of Service: the patient was seen and examined on 12/17/2022  Chief Complaint  Patient presents with   Fall   Weakness   Brief hospital course: Amanda Porter is a 77 y.o. adult with medical history significant of HTN, HLD, COPD on 3L O2, stroke, dementia, depression with anxiety, NPH (s/p of Vp shunt), lung cancer, SDH, who present with cough, SOB, fall and dark stool.  Pt has history of dementia and and more confused, cannot provide accurate medical history. Per her daughter (I called her daughter by phone), pt has cough and SOB for more than 2 days.  Patient has productive cough with thick yellow-colored sputum production.  Does not seem to have chest pain.  Patient has low-grade fever at home.  Patient has generalized weakness.  She fell this morning without significant injury. Pt has dark-colored loose stool bowel movement today.  Patient is taking iron supplement, never had dark stool before.  Daughter states that patient has dysuria and burning on urination, with increased urinary frequency recently. Patient does not have nausea vomiting or abdominal pain.  Patient moves all extremities.    Per her daughter, at her normal baseline, patient recognizes family members, most of the time orientated to place, but usually not orientated to time.  Patient is more confused than baseline per her daughter.  When I saw patient, she knows her own name, knows that she is in hospital, but confused about time.  She moves all extremities no facial droop or slurred speech.    Dr. Verl Blalock for GI is consulted due to dark stool.  EGD was performed on 12/16/22   Assessment and Plan: HCAP (healthcare-associated pneumonia): pt is home level 3 L oxygen with 99% saturation.  Clinically not septic.  Lactic acid normal 0.8. Continue IV Vancomycin and cefepime (patient received 1 dose of Rocephin in ED) Continue Mucinex for  cough  Continue Bronchodilators F/u  Urine legionella and S. pneumococcal antigen - Follow up blood culture x2 NGTD MRSA nares PCR positive F/u sputum culture Negative COVID, flu and RSV PCR   Upper GI bleeding due to AVM Normocytic anemia and melena  Hgb dropped from 9.4 -->9.2 --> 8.9. Dr. Allen Norris of IG did EGD,  showed single small AVM with duodenal erosions. Pt has angiodysplasia of stomach. S/p pantoprazole 40 mg IV twice daily, transition to pantoprazole 40 mg p.o. daily from tomorrow a.m. -hold ASA for 4 to 5 days as per GI Hb 8.0, stable   Iron deficiency, iron-10, transferrin saturation 5% Avoided IV iron due to active infection, pneumonia Started oral iron supplement Folic acid level and vitamin B12 level within normal range Follow with PCP to repeat iron profile after 3 months   COPD (chronic obstructive pulmonary disease) (HCC) -Bronchodilators   Hyperlipidemia: -Crestor     Essential hypertension -Hold amlodipine due to risk of hypotension secondary to GI bleeding -IV hydralazine as needed   Anxiety and depression -Continue home medications   Diabetes mellitus without complication ([place]): Recent A1c 7.6, poorly controlled.  Patient taking glipizide -SSI   History of CVA (cerebrovascular accident) -Crestor -Hold aspirin due to GI bleeding, for 4 to 5 days as per GI   Fall at home, initial encounter -PT/OT -Fall precaution   Dementia without behavioral disturbance ([place]) -Namenda   Protein-calorie malnutrition, severe: Body weight 45.4 kg, BMI 18.29 -Ensure -Nutrition consult  Pulmonary nodule, incidental  finding, patient has pulm nodule 6 months ago as well, Recommended to follow with PCP and pulmonologist as an outpatient, repeat CT scan after 6 months versus PET scan as an outpatient.   Body mass index is 18.29 kg/m.  Interventions:       Diet: Diabetic diet DVT Prophylaxis: SCD, pharmacological prophylaxis contraindicated due to GI bleeding     Advance goals of care discussion: DNR  Family Communication: family was not present at bedside, at the time of interview.  The pt provided permission to discuss medical plan with the family. Opportunity was given to ask question and all questions were answered satisfactorily.   Disposition:  Pt is from Home, admitted with pneumonia and upper GI bleeding, s/p EGD, still has pneumonia on IV antibiotics, which precludes a safe discharge. Discharge to home with [facility] PT[facility], TBD pending PT and OT eval, when clinically stable, may need 1-2 more days to recover.  Subjective: No significant overnight events, patient stated that she is hungry and would like to eat more.  Denies any GI bleeding, denies any worsening of shortness of breath, still has mild cough.   Physical Exam: General: NAD, lying comfortably Appear in no distress, affect appropriate Eyes: PERRLA ENT: Oral Mucosa Clear, moist  Neck: no JVD,  Cardiovascular: S1 and S2 Present, no Murmur,  Respiratory: good respiratory effort, Bilateral Air entry equal and Decreased, mild bilateral crackles, no significant wheezes Abdomen: Bowel Sound present, Soft and no tenderness,  Skin: no rashes Extremities: no Pedal edema, no calf tenderness Neurologic: without any new focal findings Gait not checked due to patient safety concerns  Vitals:   12/17/22 0009 12/17/22 0739 12/17/22 0931 12/17/22 1038  BP: (!) 124/56  130/60   Pulse: 80  85   Resp: 18  18   Temp: 97.9 F (36.6 C)  98.6 F (37 C)   TempSrc:      SpO2: 100% 99% 99% 91%  Weight:      Height:        Intake/Output Summary (Last 24 hours) at 12/17/2022 1346 Last data filed at 12/17/2022 0409 Gross per 24 hour  Intake 499.05 ml  Output 0 ml  Net 499.05 ml   Filed Weights   12/16/22 0941 12/16/22 0947  Weight: 45.4 kg 45.4 kg    Data Reviewed: I have personally reviewed and interpreted daily labs, tele strips, imagings as discussed above. I reviewed all nursing  notes, pharmacy notes, vitals, pertinent old records I have discussed plan of care as described above with RN and patient/family.  CBC: Recent Labs  Lab 12/16/22 0953 12/16/22 1500 12/16/22 2150 12/17/22 0155 12/17/22 0459  WBC 15.6* 13.2* 10.6* 9.9 8.7  HGB 9.2* 8.9* 7.8* 7.7* 8.0*  HCT 30.4* 30.0* 25.9* 25.6* 26.9*  MCV 88.1 87.7 87.5 87.4 88.5  PLT 347 360 278 291 299   Basic Metabolic Panel: Recent Labs  Lab 12/16/22 0953 12/17/22 0459  NA 139 139  K 3.7 3.5  CL 101 103  CO2 30 29  GLUCOSE 159* 74  BUN 23 19  CREATININE 0.69 0.59  CALCIUM 8.9 8.5*    Studies: No results found.  Scheduled Meds:  vitamin C  500 mg Oral Daily   buPROPion  300 mg Oral Q breakfast   busPIRone  30 mg Oral BID   clonazePAM  1 mg Oral QHS   feeding supplement  237 mL Oral BID BM   ferrous sulfate  325 mg Oral Q breakfast   insulin aspart  0-5 Units Subcutaneous QHS   insulin aspart  0-9 Units Subcutaneous TID WC   ipratropium-albuterol  3 mL Nebulization TID   melatonin  10 mg Oral QHS   memantine  5 mg Oral BID   multivitamin with minerals  1 tablet Oral Daily   pantoprazole (PROTONIX) IV  40 mg Intravenous Q12H   rosuvastatin  20 mg Oral QPM   Continuous Infusions:  ceFEPime (MAXIPIME) IV     vancomycin Stopped (12/16/22 2050)   PRN Meds: acetaminophen, albuterol, dextromethorphan-guaiFENesin  Time spent: 50 minutes  Author: Gillis Santa. MD [facility] 12/17/2022 1:46 PM  To reach On-call, see care teams to locate the attending and reach out to them via [email or web address]. If 7PM-7AM, please contact night-coverage If you still have difficulty reaching the attending provider, please page the [facility] (Director on Call) for [facility] on amion for assistance.

## 2022-12-18 DIAGNOSIS — J189 Pneumonia, unspecified organism: Secondary | ICD-10-CM | POA: Diagnosis not present

## 2022-12-18 DIAGNOSIS — E440 Moderate protein-calorie malnutrition: Secondary | ICD-10-CM | POA: Insufficient documentation

## 2022-12-18 LAB — BASIC METABOLIC PANEL
Anion gap: 8 (ref 5–15)
BUN: 15 mg/dL (ref 8–23)
CO2: 32 mmol/L (ref 22–32)
Calcium: 8.3 mg/dL — ABNORMAL LOW (ref 8.9–10.3)
Chloride: 100 mmol/L (ref 98–111)
Creatinine, Ser: 0.55 mg/dL (ref 0.44–1.00)
GFR, Estimated: >60 mL/min (ref >60)
Glucose, Bld: 118 mg/dL — ABNORMAL HIGH (ref 70–99)
Potassium: 3.4 mmol/L — ABNORMAL LOW (ref 3.5–5.1)
Sodium: 140 mmol/L (ref 135–145)

## 2022-12-18 LAB — PHOSPHORUS: Phosphorus: 3.4 mg/dL (ref 2.5–4.6)

## 2022-12-18 LAB — CBC
HCT: 26.8 % — ABNORMAL LOW (ref 36.0–46.0)
Hemoglobin: 8 g/dL — ABNORMAL LOW (ref 12.0–15.0)
MCH: 26.5 pg (ref 26.0–34.0)
MCHC: 29.9 g/dL — ABNORMAL LOW (ref 30.0–36.0)
MCV: 88.7 fL (ref 80.0–100.0)
Platelets: 326 10*3/uL (ref 150–400)
RBC: 3.02 MIL/uL — ABNORMAL LOW (ref 3.87–5.11)
RDW: 14.8 % (ref 11.5–15.5)
WBC: 8.8 10*3/uL (ref 4.0–10.5)
nRBC: 0 % (ref 0.0–0.2)

## 2022-12-18 LAB — GLUCOSE, CAPILLARY
Glucose-Capillary: 152 mg/dL — ABNORMAL HIGH (ref 70–99)
Glucose-Capillary: 160 mg/dL — ABNORMAL HIGH (ref 70–99)
Glucose-Capillary: 168 mg/dL — ABNORMAL HIGH (ref 70–99)

## 2022-12-18 LAB — MAGNESIUM: Magnesium: 2 mg/dL (ref 1.7–2.4)

## 2022-12-18 MED ORDER — ASPIRIN 81 MG PO CHEW: 81.0000 mg | CHEWABLE_TABLET | Freq: Every day | ORAL | Status: DC

## 2022-12-18 MED ORDER — DM-GUAIFENESIN ER 30-600 MG PO TB12: 1.0000 | ORAL_TABLET | Freq: Two times a day (BID) | ORAL | 0 refills | Status: AC | PRN

## 2022-12-18 MED ORDER — ASCORBIC ACID 500 MG PO TABS: 500.0000 mg | ORAL_TABLET | Freq: Every day | ORAL | 2 refills | Status: AC

## 2022-12-18 MED ORDER — DOXYCYCLINE HYCLATE 50 MG PO CAPS: 50.0000 mg | ORAL_CAPSULE | Freq: Two times a day (BID) | ORAL | 0 refills | Status: DC

## 2022-12-18 MED ORDER — PANTOPRAZOLE SODIUM 40 MG PO TBEC: 40.0000 mg | DELAYED_RELEASE_TABLET | Freq: Every day | ORAL | 2 refills | Status: DC

## 2022-12-18 MED ORDER — FERROUS SULFATE 325 (65 FE) MG PO TABS: 325.0000 mg | ORAL_TABLET | Freq: Every day | ORAL | 2 refills | Status: DC

## 2022-12-18 MED ORDER — LACTINEX PO CHEW: 1.0000 | CHEWABLE_TABLET | Freq: Three times a day (TID) | ORAL | 0 refills | Status: AC

## 2022-12-18 MED ORDER — DOXYCYCLINE HYCLATE 100 MG PO CAPS: 100.0000 mg | ORAL_CAPSULE | Freq: Two times a day (BID) | ORAL | 0 refills | Status: AC

## 2022-12-18 MED ORDER — POTASSIUM CHLORIDE CRYS ER 20 MEQ PO TBCR
40.0000 meq | EXTENDED_RELEASE_TABLET | Freq: Once | ORAL | Status: AC
  Administered 2022-12-18: 40 meq via ORAL
  Filled 2022-12-18: qty 2

## 2022-12-18 MED ORDER — AMOXICILLIN-POT CLAVULANATE 875-125 MG PO TABS: 1.0000 | ORAL_TABLET | Freq: Two times a day (BID) | ORAL | 0 refills | Status: AC

## 2022-12-18 NOTE — Discharge Summary (Signed)
[facility] Discharge Summary   Patient: Amanda Porter LFK:[id number]  PCP: Doreene Nest, NP  Date of admission: 12/16/2022   Date of discharge:  12/18/2022     Discharge Diagnoses:  Principal Problem:   HCAP (healthcare-associated pneumonia) Active Problems:   COPD (chronic obstructive pulmonary disease) (HCC)   Hyperlipidemia   Normocytic anemia   Dark stools   Essential hypertension   Anxiety and depression   Diabetes mellitus without complication (HCC)   History of CVA (cerebrovascular accident)   Fall at home, initial encounter   Dementia without behavioral disturbance (HCC)   Protein-calorie malnutrition, severe   Angiodysplasia of stomach and duodenum   Malnutrition of moderate degree   Admitted From: Home Disposition:  Home with HHPT  Recommendations for Outpatient Follow-up:  Follow with PCP in 1 week, repeat CBC and BMP after 1 week Follow-up with GI in 1 to 2 weeks Follow-up with pulmonologist in 1 to 2 weeks to for COPD management and lung cancer screening patient has history of lung cancer in the past.  Patient was found to have pulmonary nodule, may benefit from PET scan as an outpatient Follow up LABS/TEST:  as above   Diet recommendation: Carb modified diet  Activity: The patient is advised to gradually reintroduce usual activities, as tolerated  Discharge Condition: stable  Code Status: Full code   History of present illness: As per the H and P dictated on admission Hospital Course:  Amanda Porter is a 77 y.o. adult with medical history significant of HTN, HLD, COPD on 3L O2, stroke, dementia, depression with anxiety, NPH (s/p of Vp shunt), lung cancer, SDH, who present with cough, SOB, fall and dark stool.  Pt has history of dementia and and more confused, cannot provide accurate medical history. Per her daughter (I called her daughter by phone), pt has cough and SOB for more than 2 days.  Patient has productive cough with thick yellow-colored  sputum production.  Does not seem to have chest pain.  Patient has low-grade fever at home.  Patient has generalized weakness.  She fell this morning without significant injury. Pt has dark-colored loose stool bowel movement today.  Patient is taking iron supplement, never had dark stool before.  Daughter states that patient has dysuria and burning on urination, with increased urinary frequency recently. Patient does not have nausea vomiting or abdominal pain.  Patient moves all extremities.  Per her daughter, at her normal baseline, patient recognizes family members, most of the time orientated to place, but usually not orientated to time.  Patient is more confused than baseline per her daughter.  When I saw patient, she knows her own name, knows that she is in hospital, but confused about time.  She moves all extremities no facial droop or slurred speech.  Dr. Daleen Squibb for GI is consulted due to dark stool.  EGD was performed on 12/16/22   Assessment and Plan: HCAP (healthcare-associated pneumonia): pt is home level 3 L oxygen with 99% saturation.  Clinically not septic.  Lactic acid normal 0.8, S/p IV Vancomycin and cefepime (patient received 1 dose of Rocephin in ED), s/p Mucinex for cough and Bronchodilators. blood culture x2 NGTD. MRSA nares PCR positive. Negative COVID, flu and RSV PCR.  Patient was discharged on oral antibiotics Augmentin twice daily for 5 days and doxycycline 100 mg p.o. twice daily for 5 days.  Follow-up with PCP to repeat chest x-ray after 4 weeks. # Upper GI bleeding due to AVM and Normocytic anemia  and melena  Hgb dropped from 9.4 -->9.2 --> 8.9. Dr. Servando Snare of IG did EGD,  showed single small AVM with duodenal erosions. Pt has angiodysplasia of stomach. S/p pantoprazole 40 mg IV twice daily, transition to pantoprazole 40 mg p.o. daily. hold ASA for 4 to 5 days as per GI, Hb 8.5, stable. # Iron deficiency, iron-10, transferrin saturation 5%, Avoided IV iron due to active infection,  pneumonia. Started oral iron supplement. Folic acid level and vitamin B12 level within normal range. Follow with PCP to repeat iron profile after 3 months # COPD (chronic obstructive pulmonary disease) continue Bronchodilators # Hyperlipidemia: Crestor # Essential hypertension, amlodipine was held due to risk of hypotension secondary to GI bleeding.  Resumed on discharge. # Anxiety and depression, Continue home medications # Diabetes mellitus without complication [facility]): Recent A1c 7.6, Patient was taking glipizide which was resumed on discharge. # History of CVA (cerebrovascular accident), continued Crestor, Held aspirin due to GI bleeding, continue to hold aspirin for 4 to 5 days as per GI, resume on 12/22/2022 # Fall at home, initial encounter, PT/OT eval done, recommended home health PT.  Which was arranged by TOC. # Dementia without behavioral disturbance, continue Namenda # Protein-calorie malnutrition, severe: Body weight 45.4 kg, BMI 18.29, s/p Ensure Nutrition consult # Pulmonary nodule, incidental finding, patient has pulm nodule 6 months ago as well. Recommended to follow with PCP and pulmonologist as an outpatient, repeat CT scan after 6 months versus PET scan as an outpatient.  Body mass index is 18.29 kg/m.  Nutrition Problem: Moderate Malnutrition Etiology: chronic illness (dementia) Nutrition Interventions: Interventions: Ensure Enlive (each supplement provides 350kcal and 20 grams of protein), MVI  Patient was seen by physical therapy, who recommended Home health, which was arranged. On the day of the discharge the patient's vitals were stable, and no other acute medical condition were reported by patient. the patient was felt safe to be discharge at Home with Home health.  Consultants: GI Procedures: s/p EGD: showed single small AVM with duodenal erosions. Pt has angiodysplasia of stomach.  Discharge Exam: General: Appear in no distress, no Rash; Oral Mucosa Clear,  moist. Cardiovascular: S1 and S2 Present, no Murmur, Respiratory: normal respiratory effort, Bilateral Air entry present and no Crackles, no wheezes Abdomen: Bowel Sound present, Soft and no tenderness, no hernia Extremities: no Pedal edema, no calf tenderness Neurology: alert and oriented to time, place, and person affect appropriate.  Filed Weights   12/16/22 0941 12/16/22 0947  Weight: 45.4 kg 45.4 kg   Vitals:   12/17/22 2331 12/18/22 0842  BP: (!) 129/51 131/61  Pulse: 80 88  Resp: 20 18  Temp: 97.9 F (36.6 C) 98.2 F (36.8 C)  SpO2: 100% 98%    DISCHARGE MEDICATION: Allergies as of 12/18/2022       Reactions   Lexapro [escitalopram Oxalate] Other (See Comments)   Dizziness, nausea, fatigue   Neosporin  [neomycin-polymyxin-gramicidin] Rash   (Neosporin)   Neosporin [bacitracin-polymyxin B] Rash        Medication List     STOP taking these medications    folic acid 1 MG tablet Commonly known as: FOLVITE   potassium chloride SA 20 MEQ tablet Commonly known as: KLOR-CON M   Stiolto Respimat 2.5-2.5 MCG/ACT Aers Generic drug: Tiotropium Bromide-Olodaterol       TAKE these medications    Albuterol Sulfate 2.5 MG/0.5ML Nebu Inhale 2.5 mg into the lungs every 6 (six) hours as needed (shortness of breath). What changed: Another medication  with the same name was changed. Make sure you understand how and when to take each.   albuterol 108 (90 Base) MCG/ACT inhaler Commonly known as: VENTOLIN HFA INHALE 2 PUFFS BY MOUTH EVERY 4 HOURS AS NEEDED What changed:  how much to take how to take this when to take this reasons to take this additional instructions   amLODipine 2.5 MG tablet Commonly known as: NORVASC Take 1 tablet (2.5 mg total) by mouth daily. for blood pressure.   amoxicillin-clavulanate 875-125 MG tablet Commonly known as: AUGMENTIN Take 1 tablet by mouth 2 (two) times daily for 5 days.   ascorbic acid 500 MG tablet Commonly known as:  VITAMIN C Take 1 tablet (500 mg total) by mouth daily. Start taking on: December 19, 2022   aspirin 81 MG chewable tablet Chew 1 tablet (81 mg total) by mouth daily. Hold for now and resume on 12/22/22 Start taking on: December 22, 2022 What changed:  additional instructions These instructions start on December 22, 2022. If you are unsure what to do until then, ask your doctor or other care provider.   Breztri Aerosphere 160-9-4.8 MCG/ACT Aero Generic drug: Budeson-Glycopyrrol-Formoterol Inhale 2 puffs into the lungs in the morning and at bedtime.   buPROPion 300 MG 24 hr tablet Commonly known as: WELLBUTRIN XL Take 1 tablet (300 mg total) by mouth daily with breakfast. for anxiety and depression.   busPIRone 15 MG tablet Commonly known as: BUSPAR Take 30 mg by mouth 2 (two) times daily.   CALCIUM/VITAMIN D PO Take 1 tablet by mouth daily.   clonazePAM 1 MG tablet Commonly known as: KLONOPIN Take 1 mg by mouth at bedtime. What changed: Another medication with the same name was removed. Continue taking this medication, and follow the directions you see here.   dextromethorphan-guaiFENesin 30-600 MG 12hr tablet Commonly known as: MUCINEX DM Take 1 tablet by mouth 2 (two) times daily as needed for up to 7 days for cough.   doxycycline 100 MG capsule Commonly known as: VIBRAMYCIN Take 1 capsule (100 mg total) by mouth 2 (two) times daily for 5 days.   ferrous sulfate 325 (65 FE) MG tablet Take 1 tablet (325 mg total) by mouth daily with breakfast. Start taking on: December 19, 2022   glipiZIDE 2.5 MG 24 hr tablet Commonly known as: GLUCOTROL XL TAKE 1 TABLET (2.5 MG TOTAL) BY MOUTH DAILY WITH BREAKFAST. FOR DIABETES.   lactobacillus acidophilus & bulgar chewable tablet Chew 1 tablet by mouth 3 (three) times daily with meals for 10 days.   Melatonin 10 MG Tabs Take 10 mg by mouth at bedtime.   memantine 5 MG tablet Commonly known as: NAMENDA Take 1 tablet (5 mg total) by  mouth 2 (two) times daily. For memory.   pantoprazole 40 MG tablet Commonly known as: PROTONIX Take 1 tablet (40 mg total) by mouth daily. Start taking on: December 19, 2022   Premarin vaginal cream Generic drug: conjugated estrogens Apply one pea-sized amount around the opening of the urethra daily for 2 weeks, then 3 times weekly moving forward.   rosuvastatin 20 MG tablet Commonly known as: CRESTOR TAKE 1 TABLET (20 MG TOTAL) BY MOUTH EVERY EVENING. FOR CHOLESTEROL.       Allergies  Allergen Reactions   Lexapro [Escitalopram Oxalate] Other (See Comments)    Dizziness, nausea, fatigue   Neosporin  [Neomycin-Polymyxin-Gramicidin] Rash    (Neosporin)   Neosporin [Bacitracin-Polymyxin B] Rash   Discharge Instructions     Call MD  for:  difficulty breathing, headache or visual disturbances   Complete by: As directed    Call MD for:  extreme fatigue   Complete by: As directed    Call MD for:  persistant dizziness or light-headedness   Complete by: As directed    Call MD for:  persistant nausea and vomiting   Complete by: As directed    Call MD for:  severe uncontrolled pain   Complete by: As directed    Call MD for:  temperature >100.4   Complete by: As directed    Diet - low sodium heart healthy   Complete by: As directed    Discharge instructions   Complete by: As directed    Follow with PCP in 1 week, repeat CBC and BMP after 1 week Follow-up with GI in 1 to 2 weeks Follow-up with pulmonologist in 1 to 2 weeks to for COPD management and lung cancer screening patient has history of lung cancer in the past.  Patient was found to have pulmonary nodule, may benefit from PET scan as an outpatient.   Increase activity slowly   Complete by: As directed        The results of significant diagnostics from this hospitalization (including imaging, microbiology, ancillary and laboratory) are listed below for reference.    Significant Diagnostic Studies: CT Head Wo  Contrast  Result Date: 12/16/2022 CLINICAL DATA:  Provided history: Fall.  Unwitnessed fall. EXAM: CT HEAD WITHOUT CONTRAST CT CERVICAL SPINE WITHOUT CONTRAST TECHNIQUE: Multidetector CT imaging of the head and cervical spine was performed following the standard protocol without intravenous contrast. Multiplanar CT image reconstructions of the cervical spine were also generated. RADIATION DOSE REDUCTION: This exam was performed according to the departmental dose-optimization program which includes automated exposure control, adjustment of the mA and/or kV according to patient size and/or use of iterative reconstruction technique. COMPARISON:  Prior head CT examinations 09/09/2022 and earlier. Cervical spine CT 09/06/2022. chest CT 11/26/2022. FINDINGS: CT HEAD FINDINGS Brain: Unchanged position of a right frontoparietal approach ventricular catheter which terminates within the right lateral ventricle (abutting the septum pellucidum). Ventriculomegaly is unchanged from the prior head CT of 09/09/2022. Redemonstrated chronic lacunar infarcts within the bilateral thalami. Advanced patchy and confluent hypoattenuation within the cerebral white matter, nonspecific but compatible with chronic small vessel images ease. There is no acute intracranial hemorrhage. No demarcated cortical infarct. No extra-axial fluid collection. No evidence of an intracranial mass. No midline shift. Vascular: No hyperdense vessel.  Atherosclerotic calcifications. Skull: No fracture or aggressive osseous lesion. Sinuses/Orbits: No mass or acute finding within the imaged orbits. Trace fluid within the right sphenoid sinus. CT CERVICAL SPINE FINDINGS Alignment: Reversal of the expected cervical lordosis. 2 mm C4-C5 grade 1 anterolisthesis. Skull base and vertebrae: No fracture or aggressive osseous lesion. Soft tissues and spinal canal: No prevertebral fluid or swelling. No visible canal hematoma. Disc levels: Cervical spondylosis with  multilevel disc space narrowing, disc bulges/central disc protrusions, posterior disc osteophyte complexes and uncovertebral hypertrophy. Disc space narrowing is greatest at C5-C6 and C6-C7 (advanced at these levels). No appreciable high-grade spinal canal stenosis. Multilevel bony neural foraminal narrowing. Multilevel ventral osteophytes. Upper chest: Emphysema. Prior left upper lobectomy. 1.8 x 0.9 cm pulmonary nodule within the superior aspect of the left lower lobe with somewhat spiculated margins, unchanged in size from the recent prior chest CT of 11/26/2022. Other: Aortic atherosclerosis. Subcentimeter nodule within the right thyroid lobe, not meeting criteria for ultrasound follow-up based on size. No follow-up imaging  is recommended. Reference: J Am Coll Radiol. 2015 Feb;12(2): 143-50. IMPRESSION: CT head: 1. No evidence of acute intracranial abnormality. 2. Ventriculomegaly is unchanged from the prior head CT of 09/09/2022. Stable position of a right frontoparietal approach ventricular catheter. 3. Redemonstrated chronic lacunar infarcts within the bilateral thalami. 4. Advanced chronic small vessel ischemic changes within the cerebral white matter. CT cervical spine: 1. No evidence of acute fracture to the cervical spine. 2. Nonspecific reversal of the expected cervical lordosis. 3. 2 mm C4-C5 grade 1 anterolisthesis. 4. Cervical spondylosis, as described. 5. 1.8 x 0.9 cm pulmonary nodule within the superior aspect of the left lower lobe, unchanged in size from the recent prior chest CT of 11/26/2022. This may reflect scarring or low-grade adenocarcinoma. Please refer to the prior chest CT for follow-up recommendations. 6. Aortic Atherosclerosis (ICD10-I70.0) and Emphysema (ICD10-J43.9). Electronically Signed   By: Kellie Simmering D.O.   On: 12/16/2022 12:11   CT Cervical Spine Wo Contrast  Result Date: 12/16/2022 CLINICAL DATA:  Provided history: Fall.  Unwitnessed fall. EXAM: CT HEAD WITHOUT CONTRAST  CT CERVICAL SPINE WITHOUT CONTRAST TECHNIQUE: Multidetector CT imaging of the head and cervical spine was performed following the standard protocol without intravenous contrast. Multiplanar CT image reconstructions of the cervical spine were also generated. RADIATION DOSE REDUCTION: This exam was performed according to the departmental dose-optimization program which includes automated exposure control, adjustment of the mA and/or kV according to patient size and/or use of iterative reconstruction technique. COMPARISON:  Prior head CT examinations 09/09/2022 and earlier. Cervical spine CT 09/06/2022. chest CT 11/26/2022. FINDINGS: CT HEAD FINDINGS Brain: Unchanged position of a right frontoparietal approach ventricular catheter which terminates within the right lateral ventricle (abutting the septum pellucidum). Ventriculomegaly is unchanged from the prior head CT of 09/09/2022. Redemonstrated chronic lacunar infarcts within the bilateral thalami. Advanced patchy and confluent hypoattenuation within the cerebral white matter, nonspecific but compatible with chronic small vessel images ease. There is no acute intracranial hemorrhage. No demarcated cortical infarct. No extra-axial fluid collection. No evidence of an intracranial mass. No midline shift. Vascular: No hyperdense vessel.  Atherosclerotic calcifications. Skull: No fracture or aggressive osseous lesion. Sinuses/Orbits: No mass or acute finding within the imaged orbits. Trace fluid within the right sphenoid sinus. CT CERVICAL SPINE FINDINGS Alignment: Reversal of the expected cervical lordosis. 2 mm C4-C5 grade 1 anterolisthesis. Skull base and vertebrae: No fracture or aggressive osseous lesion. Soft tissues and spinal canal: No prevertebral fluid or swelling. No visible canal hematoma. Disc levels: Cervical spondylosis with multilevel disc space narrowing, disc bulges/central disc protrusions, posterior disc osteophyte complexes and uncovertebral  hypertrophy. Disc space narrowing is greatest at C5-C6 and C6-C7 (advanced at these levels). No appreciable high-grade spinal canal stenosis. Multilevel bony neural foraminal narrowing. Multilevel ventral osteophytes. Upper chest: Emphysema. Prior left upper lobectomy. 1.8 x 0.9 cm pulmonary nodule within the superior aspect of the left lower lobe with somewhat spiculated margins, unchanged in size from the recent prior chest CT of 11/26/2022. Other: Aortic atherosclerosis. Subcentimeter nodule within the right thyroid lobe, not meeting criteria for ultrasound follow-up based on size. No follow-up imaging is recommended. Reference: J Am Coll Radiol. 2015 Feb;12(2): 143-50. IMPRESSION: CT head: 1. No evidence of acute intracranial abnormality. 2. Ventriculomegaly is unchanged from the prior head CT of 09/09/2022. Stable position of a right frontoparietal approach ventricular catheter. 3. Redemonstrated chronic lacunar infarcts within the bilateral thalami. 4. Advanced chronic small vessel ischemic changes within the cerebral white matter. CT cervical spine: 1. No  evidence of acute fracture to the cervical spine. 2. Nonspecific reversal of the expected cervical lordosis. 3. 2 mm C4-C5 grade 1 anterolisthesis. 4. Cervical spondylosis, as described. 5. 1.8 x 0.9 cm pulmonary nodule within the superior aspect of the left lower lobe, unchanged in size from the recent prior chest CT of 11/26/2022. This may reflect scarring or low-grade adenocarcinoma. Please refer to the prior chest CT for follow-up recommendations. 6. Aortic Atherosclerosis (ICD10-I70.0) and Emphysema (ICD10-J43.9). Electronically Signed   By: Jackey Loge D.O.   On: 12/16/2022 12:11   DG Chest 2 View  Result Date: 12/16/2022 CLINICAL DATA:  Provided history: Weakness, cough. EXAM: CHEST - 2 VIEW COMPARISON:  Prior chest radiographs 11/10/2022 and earlier. Chest CT 11/26/2022. FINDINGS: A ventriculoperitoneal shunt partially imaged  ventriculoperitoneal shunt traversing the lower right neck, chest and upper abdomen. Cardiomediastinal silhouette is unchanged. Aortic atherosclerosis. Surgical clips along/within the left mediastinum. Postoperative changes from prior left upper lobectomy. A known 1.3 x 0.9 cm lesion within the superior aspect of the left lower lobe was better appreciated on the prior chest CT of 11/26/2022. Linear and ill-defined opacities at the left lung base partial silhouetting of the left hemidiaphragm. Known emphysema, better delineated on the prior chest CT. No evidence of pleural effusion or pneumothorax. No acute bony abnormality identified. Partially imaged dextrocurvature of the lower thoracic and visualized upper lumbar spine. IMPRESSION: Known 1.3 x 0.9 cm pulmonary lesion within the superior aspect of the left lower lobe, better appreciated on the prior chest CT of 11/26/2022. This may reflect scarring or low-grade adenocarcinoma. Please refer to the prior chest CT for follow-up recommendations. Linear and ill-defined opacities within the left lung base with partial silhouetting of the left hemidiaphragm. While this at least partially reflects atelectasis and scarring, left lower lobe pneumonia cannot be excluded. Aortic Atherosclerosis (ICD10-I70.0) and Emphysema (ICD10-J43.9). Electronically Signed   By: Jackey Loge D.O.   On: 12/16/2022 11:37   CT Chest Wo Contrast  Result Date: 11/27/2022 CLINICAL DATA:  Surveillance of lung nodules EXAM: CT CHEST WITHOUT CONTRAST TECHNIQUE: Multidetector CT imaging of the chest was performed following the standard protocol without IV contrast. RADIATION DOSE REDUCTION: This exam was performed according to the departmental dose-optimization program which includes automated exposure control, adjustment of the mA and/or kV according to patient size and/or use of iterative reconstruction technique. COMPARISON:  09/06/2022 FINDINGS: Cardiovascular: Coronary, aortic arch, and  branch vessel atherosclerotic vascular disease. Mitral valve calcifications. Mediastinum/Nodes: Small type 1 hiatal hernia. Lungs/Pleura: Severe emphysema. Prior left upper lobectomy. Left lower lobe nodule near the lung apex 1.3 by 0.9 by 0.9 cm, mildly spiculated margins. By my measurement this was formerly 1.2 by 0.7 by 0.9 cm on 09/06/2022 and formerly 1.3 by 0.7 by 0.9 cm on the chest CT from 05/14/2022. Accordingly the lesion is relatively stable over the last 6 months. There is some 2-3 mm nodules laterally in the left mid lung along with some interstitial accentuation of the lung bases partially obscured by motion artifact even on rescanning attempts. Upper Abdomen: Cholelithiasis. Nonobstructive right nephrolithiasis. Abdominal aortic atherosclerosis. Musculoskeletal: Stable 4 mm sclerotic lesion in the upper sternal body, no change from 05/14/2022. IMPRESSION: 1. Stable 1.3 by 0.9 by 0.9 cm left lower lobe nodule near the lung apex, previously 1.2 by 0.7 by 0.9 cm on 09/06/2022 and formerly 1.3 by 0.7 by 0.9 cm on 05/14/2022. Accordingly the lesion is relatively stable over the last 6 months. This could be from scarring or low-grade adenocarcinoma.  Reasonable workup at this point might include nuclear medicine PET-CT, follow up chest CT in 6 months time, or tissue diagnosis. 2. There are some 2-3 mm nodules laterally in the left mid lung along with some interstitial accentuation of the lung bases partially obscured by motion artifact even on rescanning attempts. 3. Cholelithiasis. 4. Nonobstructive right nephrolithiasis. 5. Small type 1 hiatal hernia. 6. Aortic atherosclerosis. 7. Prior left upper lobectomy. Aortic Atherosclerosis (ICD10-I70.0). Electronically Signed   By: Gaylyn Rong M.D.   On: 11/27/2022 16:06    Microbiology: Recent Results (from the past 240 hour(s))  Resp panel by RT-PCR (RSV, Flu A&B, Covid) Anterior Nasal Swab     Status: None   Collection Time: 12/16/22 10:44 AM    Specimen: Anterior Nasal Swab  Result Value Ref Range Status   SARS Coronavirus 2 by RT PCR NEGATIVE NEGATIVE Final    Comment: (NOTE) SARS-CoV-2 target nucleic acids are NOT DETECTED.  The SARS-CoV-2 RNA is generally detectable in upper respiratory specimens during the acute phase of infection. The lowest concentration of SARS-CoV-2 viral copies this assay can detect is 138 copies/mL. A negative result does not preclude SARS-Cov-2 infection and should not be used as the sole basis for treatment or other patient management decisions. A negative result may occur with  improper specimen collection/handling, submission of specimen other than nasopharyngeal swab, presence of viral mutation(s) within the areas targeted by this assay, and inadequate number of viral copies(<138 copies/mL). A negative result must be combined with clinical observations, patient history, and epidemiological information. The expected result is Negative.  Fact Sheet for Patients:  BloggerCourse.com  Fact Sheet for Healthcare Providers:  SeriousBroker.it  This test is no t yet approved or cleared by the [place] FDA and  has been authorized for detection and/or diagnosis of SARS-CoV-2 by FDA under an Emergency Use Authorization (EUA). This EUA will remain  in effect (meaning this test can be used) for the duration of the COVID-19 declaration under Section 564(b)(1) of the Act, 21 U.S.C.section 360bbb-3(b)(1), unless the authorization is terminated  or revoked sooner.       Influenza A by PCR NEGATIVE NEGATIVE Final   Influenza B by PCR NEGATIVE NEGATIVE Final    Comment: (NOTE) The Xpert Xpress SARS-CoV-2/FLU/RSV plus assay is intended as an aid in the diagnosis of influenza from Nasopharyngeal swab specimens and should not be used as a sole basis for treatment. Nasal washings and aspirates are unacceptable for Xpert Xpress  SARS-CoV-2/FLU/RSV testing.  Fact Sheet for Patients: BloggerCourse.com  Fact Sheet for Healthcare Providers: SeriousBroker.it  This test is not yet approved or cleared by the [place] FDA and has been authorized for detection and/or diagnosis of SARS-CoV-2 by FDA under an Emergency Use Authorization (EUA). This EUA will remain in effect (meaning this test can be used) for the duration of the COVID-19 declaration under Section 564(b)(1) of the Act, 21 U.S.C. section 360bbb-3(b)(1), unless the authorization is terminated or revoked.     Resp Syncytial Virus by PCR NEGATIVE NEGATIVE Final    Comment: (NOTE) Fact Sheet for Patients: BloggerCourse.com  Fact Sheet for Healthcare Providers: SeriousBroker.it  This test is not yet approved or cleared by the [place] FDA and has been authorized for detection and/or diagnosis of SARS-CoV-2 by FDA under an Emergency Use Authorization (EUA). This EUA will remain in effect (meaning this test can be used) for the duration of the COVID-19 declaration under Section 564(b)(1) of the Act, 21 U.S.C. section 360bbb-3(b)(1), unless the  authorization is terminated or revoked.  Performed at [facility], [address] [place], [place] [address]   Blood culture (routine x 2)     Status: None (Preliminary result)   Collection Time: 12/16/22 12:29 PM   Specimen: BLOOD  Result Value Ref Range Status   Specimen Description BLOOD BLOOD LEFT ARM  Final   Special Requests   Final    BOTTLES DRAWN AEROBIC AND ANAEROBIC Blood Culture adequate volume   Culture   Final    NO GROWTH 2 DAYS Performed at [facility], [address], [place], [place] [address]    Report Status PENDING  Incomplete  Blood culture (routine x 2)     Status: None (Preliminary result)   Collection Time: 12/16/22  1:18 PM   Specimen: BLOOD  Result  Value Ref Range Status   Specimen Description BLOOD BLOOD LEFT ARM  Final   Special Requests   Final    BOTTLES DRAWN AEROBIC AND ANAEROBIC Blood Culture adequate volume   Culture   Final    NO GROWTH 2 DAYS Performed at [facility], [address], [place], [place] [address]    Report Status PENDING  Incomplete  MRSA Next Gen by PCR, Nasal     Status: Abnormal   Collection Time: 12/16/22  1:40 PM   Specimen: Nasal Mucosa; Nasal Swab  Result Value Ref Range Status   MRSA by PCR Next Gen DETECTED (A) NOT DETECTED Final    Comment: RESULT CALLED TO, READ BACK BY AND VERIFIED WITH: NISHA ROBINSON @2025  ON 12/16/22 RP  (NOTE) The GeneXpert MRSA Assay (FDA approved for NASAL specimens only), is one component of a comprehensive MRSA colonization surveillance program. It is not intended to diagnose MRSA infection nor to guide or monitor treatment for MRSA infections. Test performance is not FDA approved in patients less than 85 years old. Performed at [facility], [address] [place], [place]      Labs: CBC: Recent Labs  Lab 12/16/22 2150 12/17/22 0155 12/17/22 0459 12/17/22 1416 12/18/22 0458  WBC 10.6* 9.9 8.7 10.0 8.8  HGB 7.8* 7.7* 8.0* 8.5* 8.0*  HCT 25.9* 25.6* 26.9* 28.3* 26.8*  MCV 87.5 87.4 88.5 88.2 88.7  PLT 278 291 299 306 326   Basic Metabolic Panel: Recent Labs  Lab 12/16/22 0953 12/17/22 0459 12/18/22 0458  NA 139 139 140  K 3.7 3.5 3.4*  CL 101 103 100  CO2 30 29 32  GLUCOSE 159* 74 118*  BUN 23 19 15   CREATININE 0.69 0.59 0.55  CALCIUM 8.9 8.5* 8.3*  MG  --   --  2.0  PHOS  --   --  3.4   Liver Function Tests: No results for input(s): "AST", "ALT", "ALKPHOS", "BILITOT", "PROT", "ALBUMIN" in the last 168 hours. No results for input(s): "LIPASE", "AMYLASE" in the last 168 hours. No results for input(s): "AMMONIA" in the last 168 hours. Cardiac Enzymes: No results for input(s): "CKTOTAL", "CKMB", "CKMBINDEX",  "TROPONINI" in the last 168 hours. BNP (last 3 results) Recent Labs    09/06/22 1020  BNP 49.1   CBG: Recent Labs  Lab 12/17/22 0749 12/17/22 1203 12/17/22 1719 12/18/22 0740 12/18/22 1212  GLUCAP 84 113* 101* 152* 160*    Time spent: 35 minutes  Signed:  12/20/22  [facility]  12/18/2022 1:32 PM

## 2022-12-18 NOTE — Plan of Care (Signed)
  Problem: Skin Integrity: Goal: Risk for impaired skin integrity will decrease Outcome: Progressing   Problem: Activity: Goal: Ability to tolerate increased activity will improve Outcome: Progressing   Problem: Respiratory: Goal: Ability to maintain adequate ventilation will improve Outcome: Progressing   Problem: Activity: Goal: Risk for activity intolerance will decrease 12/18/2022 0807 by Wilfred Lacy, RN Outcome: Progressing 12/17/2022 1949 by Wilfred Lacy, RN Outcome: Progressing   Problem: Nutrition: Goal: Adequate nutrition will be maintained Outcome: Progressing   Problem: Elimination: Goal: Will not experience complications related to urinary retention Outcome: Progressing   Problem: Safety: Goal: Ability to remain free from injury will improve Outcome: Progressing

## 2022-12-18 NOTE — Progress Notes (Signed)
PT Cancellation Note  Patient Details Name: [name] MRN: [id number] DOB: [birth year]   Cancelled Treatment:    Reason Eval/Treat Not Completed: Patient declined, no reason specified Patient states she was up earlier. Declines getting up or walking at this time. Will re-attempt later as time allows.   Aryannah Mohon 12/18/2022, 11:04 AM

## 2022-12-18 NOTE — Progress Notes (Signed)
DISCHARGE NOTE:  Pts daughter given discharge instructions and verbalized understanding. Pt wheeled to car by staff. Pts connected to home O2 3L. Daughter transporting pt home.

## 2022-12-18 NOTE — TOC Progression Note (Signed)
Transition of Care [facility]) - Progression Note    Patient Details  Name: Amanda Porter MRN: [id number] Date of Birth: 1946/10/13  Transition of Care [facility]) CM/SW Contact  Marlowe Sax, RN Phone Number: 12/18/2022, 1:59 PM  Clinical Narrative:    The patient is open with [facility] for [facility] services    Expected Discharge Plan: Home w Home Health Services Barriers to Discharge: No Barriers Identified  Expected Discharge Plan and Services   Discharge Planning Services: CM Consult   Living arrangements for the past 2 months: Single Family Home Expected Discharge Date: 12/18/22                 DME Agency: NA                   Social Determinants of Health (SDOH) Interventions SDOH Screenings   Food Insecurity: No Food Insecurity (10/20/2022)  Housing: Low Risk  (10/20/2022)  Transportation Needs: No Transportation Needs (10/20/2022)  Utilities: Not At Risk (10/20/2022)  Depression (PHQ2-9): Low Risk  (10/20/2022)  Financial Resource Strain: Low Risk  (10/20/2022)  Stress: No Stress Concern Present (10/20/2022)  Tobacco Use: Medium Risk (12/17/2022)    Readmission Risk Interventions     No data to display

## 2022-12-19 ENCOUNTER — Telehealth: Payer: Self-pay

## 2022-12-19 DIAGNOSIS — E1165 Type 2 diabetes mellitus with hyperglycemia: Secondary | ICD-10-CM

## 2022-12-19 MED ORDER — FREESTYLE LIBRE 3 SENSOR MISC: 1 refills | Status: DC

## 2022-12-19 NOTE — Patient Outreach (Signed)
  Care Coordination [facility] Note Transition Care Management Unsuccessful Follow-up Telephone Call  Date of discharge and from where:  [facility] 12/18/22  Attempts:  1st Attempt  Reason for unsuccessful TCM follow-up call:  No answer/busy  Jodelle Gross, RN, BSN, CCM Care Management Coordinator [facility] Phone: [phone]/Fax: [phone]

## 2022-12-21 LAB — CULTURE, BLOOD (ROUTINE X 2)
Culture: NO GROWTH
Culture: NO GROWTH
Special Requests: ADEQUATE
Special Requests: ADEQUATE

## 2022-12-22 ENCOUNTER — Telehealth: Payer: Self-pay

## 2022-12-22 NOTE — Patient Outreach (Signed)
  Care Coordination [facility] Note Transition Care Management Unsuccessful Follow-up Telephone Call  Date of discharge and from where:  [facility] 12/18/22  Attempts:  2nd Attempt  Reason for unsuccessful TCM follow-up call:  No answer/busy- Voicemail not identified  Jodelle Gross, RN, BSN, CCM Care Management Coordinator [facility] Phone: [phone]/Fax: [phone]

## 2022-12-22 NOTE — Telephone Encounter (Signed)
We received an FMLA form for Amanda Porter from Naoko Diperna, daughter.  This is the same as her last FMLA form.  Form placed in your inbox for completion and signing.  Due date 01/01/23.

## 2022-12-23 ENCOUNTER — Telehealth: Payer: Self-pay

## 2022-12-23 ENCOUNTER — Telehealth: Payer: Self-pay | Admitting: *Deleted

## 2022-12-23 NOTE — Patient Outreach (Signed)
  Care Coordination TOC Note Transition Care Management Follow-up Telephone Call Date of discharge and from where: [facility] 12/18/22 How have you been since you were released from the hospital? Per daughter her Mom is doing okay but she could not speak long as she was at work. Any questions or concerns? No  Items Reviewed: Did the pt receive and understand the discharge instructions provided? Yes  Medications obtained and verified? Yes  Other? No  Any new allergies since your discharge? No  Dietary orders reviewed? No Do you have support at home? Yes   Home Care and Equipment/Supplies: Were home health services ordered? Yes If so, what is the name of the agency? [facility] Has the agency set up a time to come to the patient's home? Yes Were any new equipment or medical supplies ordered?  No What is the name of the medical supply agency? N/a Were you able to get the supplies/equipment? not applicable Do you have any questions related to the use of the equipment or supplies? No  Functional Questionnaire: (I = Independent and D = Dependent) ADLs: Needs assistance  Bathing/Dressing- Needs assistance  Meal Prep- D  Eating- I  Maintaining continence- Needs assistance  Transferring/Ambulation- Needs assistance  Managing Meds- D  Follow up appointments reviewed:  PCP Hospital f/u appt confirmed? No  Specialist Hospital f/u appt confirmed? No   Are transportation arrangements needed? No  If their condition worsens, is the pt aware to call PCP or go to the Emergency Dept.? Yes Was the patient provided with contact information for the PCP's office or ED? Yes Was to pt encouraged to call back with questions or concerns? Yes  SDOH assessments and interventions completed:   Yes SDOH Interventions Today    Flowsheet Row Most Recent Value  SDOH Interventions   Food Insecurity Interventions Intervention Not Indicated  Housing Interventions Intervention Not Indicated  Transportation  Interventions Intervention Not Indicated       Care Coordination Interventions:  PCP follow up appointment requested   Encounter Outcome:  Pt. Visit Completed

## 2022-12-23 NOTE — Telephone Encounter (Signed)
Completed and placed on [name]'s desk 

## 2022-12-23 NOTE — Progress Notes (Signed)
  Care Coordination  Note  12/23/2022 Name: Amanda Porter MRN: [id number] DOB: [birth year]  Amanda Porter is a 77 y.o. year old primary care patient of Doreene Nest, NP. I reached out to Amanda Porter by phone today to assist with scheduling a follow up appointment. Amanda Porter verbally consented to my assistance.       Follow up plan: Hospital Follow Up appointment scheduled with Chestine Spore, Keane Scrape, NP) on (12/31/22) at (10:40 AM).  [facility]  Care Coordination Care Guide  Direct Dial: [phone]

## 2022-12-23 NOTE — Telephone Encounter (Signed)
Patient's copy placed in outgoing mail per request.

## 2022-12-23 NOTE — Progress Notes (Signed)
  Care Coordination  Note  12/23/2022 Name: Amanda Porter MRN: [id number] DOB: [birth year]  Amanda Porter is a 77 y.o. year old primary care patient of Doreene Nest, NP. I reached out to Amanda Porter by phone today to assist with scheduling a follow up appointment. Amanda Porter verbally consented to my assistance.       Follow up plan: Unsuccessful telephone outreach attempt made. A HIPAA compliant phone message was left for the patient providing contact information and requesting a return call.   [facility]  Care Coordination Care Guide  Direct Dial: [phone]

## 2022-12-23 NOTE — Telephone Encounter (Signed)
Completed form faxed to fax#[phone].  Received fax confirmation.  Copies made for patient, scanning, and myself.  Sent msg to patient via mychart to see how she would like to receive her copy.

## 2022-12-25 NOTE — Telephone Encounter (Signed)
Updated form has been faxed to fax#[phone].  Received fax confirmation.  Copies made for patient, scanning, and myself.  Patient's copy placed in outgoing mail.

## 2022-12-25 NOTE — Telephone Encounter (Signed)
Completed and placed on [name]'s desk 

## 2022-12-25 NOTE — Telephone Encounter (Signed)
Amanda Porter, patient's daughter, sent in another of the same form.  She works 12 hour shifts, and the previous form we sent in only allows for 8 hour shifts.  She is requesting that you sign this updated form.  Placed in your inbox for signing.

## 2022-12-26 ENCOUNTER — Emergency Department (HOSPITAL_COMMUNITY): Payer: Medicare PPO

## 2022-12-26 ENCOUNTER — Encounter (HOSPITAL_COMMUNITY): Payer: Self-pay | Admitting: Emergency Medicine

## 2022-12-26 ENCOUNTER — Emergency Department (HOSPITAL_COMMUNITY)
Admission: EM | Admit: 2022-12-26 | Discharge: 2022-12-29 | Disposition: A | Discharge status: Home / Self Care | Payer: Medicare PPO | Attending: Emergency Medicine | Admitting: Emergency Medicine

## 2022-12-26 DIAGNOSIS — Z982 Presence of cerebrospinal fluid drainage device: Secondary | ICD-10-CM | POA: Diagnosis not present

## 2022-12-26 DIAGNOSIS — E1165 Type 2 diabetes mellitus with hyperglycemia: Secondary | ICD-10-CM | POA: Insufficient documentation

## 2022-12-26 DIAGNOSIS — Z7951 Long term (current) use of inhaled steroids: Secondary | ICD-10-CM | POA: Insufficient documentation

## 2022-12-26 DIAGNOSIS — R059 Cough, unspecified: Secondary | ICD-10-CM | POA: Insufficient documentation

## 2022-12-26 DIAGNOSIS — Z7982 Long term (current) use of aspirin: Secondary | ICD-10-CM | POA: Diagnosis not present

## 2022-12-26 DIAGNOSIS — J189 Pneumonia, unspecified organism: Secondary | ICD-10-CM | POA: Diagnosis not present

## 2022-12-26 DIAGNOSIS — R739 Hyperglycemia, unspecified: Secondary | ICD-10-CM | POA: Diagnosis not present

## 2022-12-26 DIAGNOSIS — Z7984 Long term (current) use of oral hypoglycemic drugs: Secondary | ICD-10-CM | POA: Insufficient documentation

## 2022-12-26 DIAGNOSIS — J439 Emphysema, unspecified: Secondary | ICD-10-CM | POA: Diagnosis not present

## 2022-12-26 DIAGNOSIS — Z20822 Contact with and (suspected) exposure to covid-19: Secondary | ICD-10-CM | POA: Insufficient documentation

## 2022-12-26 DIAGNOSIS — D72829 Elevated white blood cell count, unspecified: Secondary | ICD-10-CM | POA: Insufficient documentation

## 2022-12-26 DIAGNOSIS — R911 Solitary pulmonary nodule: Secondary | ICD-10-CM | POA: Diagnosis not present

## 2022-12-26 DIAGNOSIS — J449 Chronic obstructive pulmonary disease, unspecified: Secondary | ICD-10-CM | POA: Diagnosis not present

## 2022-12-26 DIAGNOSIS — Z79899 Other long term (current) drug therapy: Secondary | ICD-10-CM | POA: Insufficient documentation

## 2022-12-26 DIAGNOSIS — I10 Essential (primary) hypertension: Secondary | ICD-10-CM | POA: Diagnosis not present

## 2022-12-26 DIAGNOSIS — M25551 Pain in right hip: Secondary | ICD-10-CM | POA: Diagnosis not present

## 2022-12-26 DIAGNOSIS — R531 Weakness: Secondary | ICD-10-CM | POA: Insufficient documentation

## 2022-12-26 DIAGNOSIS — F0390 Unspecified dementia without behavioral disturbance: Secondary | ICD-10-CM | POA: Insufficient documentation

## 2022-12-26 LAB — RESP PANEL BY RT-PCR (RSV, FLU A&B, COVID)  RVPGX2
Influenza A by PCR: NEGATIVE
Influenza B by PCR: NEGATIVE
Resp Syncytial Virus by PCR: NEGATIVE
SARS Coronavirus 2 by RT PCR: NEGATIVE

## 2022-12-26 LAB — BASIC METABOLIC PANEL
Anion gap: 6 (ref 5–15)
BUN: 13 mg/dL (ref 8–23)
CO2: 29 mmol/L (ref 22–32)
Calcium: 8.2 mg/dL — ABNORMAL LOW (ref 8.9–10.3)
Chloride: 104 mmol/L (ref 98–111)
Creatinine, Ser: 0.54 mg/dL (ref 0.44–1.00)
GFR, Estimated: >60 mL/min (ref >60)
Glucose, Bld: 174 mg/dL — ABNORMAL HIGH (ref 70–99)
Potassium: 3.6 mmol/L (ref 3.5–5.1)
Sodium: 139 mmol/L (ref 135–145)

## 2022-12-26 LAB — CBC
HCT: 28.8 % — ABNORMAL LOW (ref 36.0–46.0)
Hemoglobin: 8.7 g/dL — ABNORMAL LOW (ref 12.0–15.0)
MCH: 27.1 pg (ref 26.0–34.0)
MCHC: 30.2 g/dL (ref 30.0–36.0)
MCV: 89.7 fL (ref 80.0–100.0)
Platelets: 328 10*3/uL (ref 150–400)
RBC: 3.21 MIL/uL — ABNORMAL LOW (ref 3.87–5.11)
RDW: 14.9 % (ref 11.5–15.5)
WBC: 14.1 10*3/uL — ABNORMAL HIGH (ref 4.0–10.5)
nRBC: 0 % (ref 0.0–0.2)

## 2022-12-26 LAB — URINALYSIS, ROUTINE W REFLEX MICROSCOPIC
Bilirubin Urine: NEGATIVE
Glucose, UA: NEGATIVE mg/dL
Hgb urine dipstick: NEGATIVE
Ketones, ur: NEGATIVE mg/dL
Leukocytes,Ua: NEGATIVE
Nitrite: NEGATIVE
Protein, ur: NEGATIVE mg/dL
Specific Gravity, Urine: 1.015 (ref 1.005–1.030)
pH: 7 (ref 5.0–8.0)

## 2022-12-26 MED ORDER — BUSPIRONE HCL 10 MG PO TABS
30.0000 mg | ORAL_TABLET | Freq: Once | ORAL | Status: AC
  Administered 2022-12-26: 30 mg via ORAL
  Filled 2022-12-26: qty 3

## 2022-12-26 MED ORDER — CLONAZEPAM 0.5 MG PO TABS
1.0000 mg | ORAL_TABLET | Freq: Once | ORAL | Status: AC
  Administered 2022-12-26: 1 mg via ORAL
  Filled 2022-12-26: qty 2

## 2022-12-26 NOTE — ED Notes (Signed)
Bed alarm place under PT

## 2022-12-26 NOTE — ED Notes (Signed)
Returned from  X-ray

## 2022-12-26 NOTE — ED Provider Notes (Signed)
  Physical Exam  BP (!) 150/63   Pulse 66   Temp 98 F (36.7 C)   Resp 16   Ht 5\' 2"  (1.575 m)   Wt 45.4 kg   SpO2 97%   BMI 18.31 kg/m   Physical Exam  Procedures  Procedures  ED Course / MDM   Clinical Course as of 12/27/22 0618  Fri Dec 26, 2022  1524 Signed out pending labs and imaging. [CG]    Clinical Course User Index [CG] Azucena Cecil, PA-C   Medical Decision Making Amount and/or Complexity of Data Reviewed Labs: ordered. Radiology: ordered.  Risk Prescription drug management.   Care of patient signed from preceding ED provider Astrid Drafts, PA-C at time of shift change.  Patient awaiting PT eval for SNF placement in context of dementia, recurrent urinary tract infections, and failure to improve and functional status despite home PT/OT.  Hemodynamically stable, home meds ordered by preceding ED provider.  Patient resting comfortably in hospital bed with normal hemodynamics throughout the night.  Patient still requiring PT evaluation this morning for SNF placement.  Care this patient signed out to oncoming ED team.  This chart was dictated using voice recognition software, Dragon. Despite the best efforts of this provider to proofread and correct errors, errors may still occur which can change documentation meaning.        Aura Dials 12/27/22 0618    Teressa Lower, MD 12/27/22 2308

## 2022-12-26 NOTE — ED Triage Notes (Signed)
Pt here from home with c/o weakness unable to stand and possible UTI, just finished antibiotics for PNA , 3 liters O2 at baseline

## 2022-12-26 NOTE — ED Provider Notes (Signed)
  Physical Exam  BP (!) 161/76   Pulse 81   Temp 98.4 F (36.9 C) (Oral)   Resp (!) 22   SpO2 98%   Physical Exam  Procedures  Procedures  ED Course / MDM   Clinical Course as of 12/26/22 2245  Fri Dec 26, 2022  1524 Signed out pending labs and imaging. [CG]    Clinical Course User Index [CG] Azucena Cecil, PA-C   Medical Decision Making Amount and/or Complexity of Data Reviewed Labs: ordered. Radiology: ordered.  Risk Prescription drug management.   Patient signed out to me at shift change by previous provider.  Please see her note for further details of the event.  Patient signed out to me pending imaging studies, urinalysis.  Patient urinalysis is negative for all.  Patient daughter reports that the patient urinalysis is typically normal, states that on culture it typically grows something out.  Patient urine has been sent off for culture.  Patient imaging studies negative for all.  Patient hip x-ray stable, CT head stable.  No acute pathology is identified.  Patient daughter notified, [name], of imaging study results.  Patient daughter reports that the patient has had increased ambulation difficulties at home.  Patient daughter reports that they have had PT/OT at the house however patient daughter reports that she is now unable to take care for patient at home.  Patient daughter requesting PT consult for SNF placement.  PT consults been placed at this time.  Patient will be evaluated in morning.  Patient will be signed out to oncoming team pending PT consult.  Nighttime meds to include buspirone, clonazepam have been ordered.       Azucena Cecil, PA-C 12/26/22 2247    Audley Hose, MD 12/30/22 1322

## 2022-12-26 NOTE — ED Provider Notes (Signed)
[place] EMERGENCY DEPARTMENT AT [facility] Provider Note   CSN: [id number] Arrival date & time: 12/26/22  1338     History  No chief complaint on file.   Amanda Porter is a 77 y.o. adult who presents emergency department concerns for generalized weakness ongoing for the past week.  Voices concern for UTI.  Has associated cough.  Denies rhinorrhea, nasal congestion, shortness of breath, abdominal pain, nausea, vomiting, numbness, tingling.  Patient is on 3 L of oxygen continuously.  Was recently treated for pneumonia.  The history is provided by the patient. No language interpreter was used.       Home Medications Prior to Admission medications   Medication Sig Start Date End Date Taking? Authorizing Provider  albuterol (VENTOLIN HFA) 108 (90 Base) MCG/ACT inhaler INHALE 2 PUFFS BY MOUTH EVERY 4 HOURS AS NEEDED Patient taking differently: Inhale 2 puffs into the lungs every 4 (four) hours as needed for wheezing or shortness of breath. 07/11/22   Baird Lyons D, MD  Albuterol Sulfate 2.5 MG/0.5ML NEBU Inhale 2.5 mg into the lungs every 6 (six) hours as needed (shortness of breath).    [provider]  amLODipine (NORVASC) 2.5 MG tablet Take 1 tablet (2.5 mg total) by mouth daily. for blood pressure. 08/19/22   Pleas Koch, NP  ascorbic acid (VITAMIN C) 500 MG tablet Take 1 tablet (500 mg total) by mouth daily. 12/19/22 03/19/23  Val Riles, MD  aspirin 81 MG chewable tablet Chew 1 tablet (81 mg total) by mouth daily. Hold for now and resume on 12/22/22 12/22/22   Val Riles, MD  Budeson-Glycopyrrol-Formoterol (BREZTRI AEROSPHERE) 160-9-4.8 MCG/ACT AERO Inhale 2 puffs into the lungs in the morning and at bedtime. Patient not taking: Reported on 12/16/2022 12/16/22   Baird Lyons D, MD  buPROPion (WELLBUTRIN XL) 300 MG 24 hr tablet Take 1 tablet (300 mg total) by mouth daily with breakfast. for anxiety and depression. 11/10/22   Pleas Koch, NP   busPIRone (BUSPAR) 15 MG tablet Take 30 mg by mouth 2 (two) times daily.    [provider]  Calcium Carb-Cholecalciferol (CALCIUM/VITAMIN D PO) Take 1 tablet by mouth daily.    [provider]  clonazePAM (KLONOPIN) 1 MG tablet Take 1 mg by mouth at bedtime.    [provider]  conjugated estrogens (PREMARIN) vaginal cream Apply one pea-sized amount around the opening of the urethra daily for 2 weeks, then 3 times weekly moving forward. 08/20/22   Vaillancourt, Aldona Bar, PA-C  Continuous Blood Gluc Sensor (FREESTYLE LIBRE 3 SENSOR) MISC Place 1 sensor on the skin every 14 days. Use to check glucose continuously 12/19/22   Pleas Koch, NP  ferrous sulfate 325 (65 FE) MG tablet Take 1 tablet (325 mg total) by mouth daily with breakfast. 12/19/22 03/19/23  Val Riles, MD  glipiZIDE (GLUCOTROL XL) 2.5 MG 24 hr tablet TAKE 1 TABLET (2.5 MG TOTAL) BY MOUTH DAILY WITH BREAKFAST. FOR DIABETES. 08/04/22   Pleas Koch, NP  lactobacillus acidophilus & bulgar (LACTINEX) chewable tablet Chew 1 tablet by mouth 3 (three) times daily with meals for 10 days. 12/18/22 12/28/22  Val Riles, MD  Melatonin 10 MG TABS Take 10 mg by mouth at bedtime.    [provider]  memantine (NAMENDA) 5 MG tablet Take 1 tablet (5 mg total) by mouth 2 (two) times daily. For memory. 07/14/22   Pleas Koch, NP  pantoprazole (PROTONIX) 40 MG tablet Take 1 tablet (  40 mg total) by mouth daily. 12/19/22 03/19/23  Val Riles, MD  rosuvastatin (CRESTOR) 20 MG tablet TAKE 1 TABLET (20 MG TOTAL) BY MOUTH EVERY EVENING. FOR CHOLESTEROL. 07/13/22   Pleas Koch, NP      Allergies    Lexapro [escitalopram oxalate], Neosporin  [neomycin-polymyxin-gramicidin], and Neosporin [bacitracin-polymyxin b]    Review of Systems   Review of Systems  All other systems reviewed and are negative.   Physical Exam Updated Vital Signs BP (!) 146/66   Pulse 88   Temp 98.5 F (36.9 C) (Oral)    Resp 17   SpO2 100%  Physical Exam Vitals and nursing note reviewed.  Constitutional:      General: Amanda A Louis "[name]" is not in acute distress.    Appearance: Amanda A [place] "[name]" is not diaphoretic.  HENT:     Head: Normocephalic and atraumatic.     Mouth/Throat:     Pharynx: No oropharyngeal exudate.  Eyes:     General: No scleral icterus.    Conjunctiva/sclera: Conjunctivae normal.  Cardiovascular:     Rate and Rhythm: Normal rate and regular rhythm.     Pulses: Normal pulses.     Heart sounds: Normal heart sounds.  Pulmonary:     Effort: Pulmonary effort is normal. No respiratory distress.     Breath sounds: Normal breath sounds. No wheezing.  Abdominal:     General: Bowel sounds are normal.     Palpations: Abdomen is soft. There is no mass.     Tenderness: There is no abdominal tenderness. There is no guarding or rebound.     Comments: No abdominal tenderness to palpation.  Musculoskeletal:        General: Normal range of motion.     Cervical back: Normal range of motion and neck supple.  Skin:    General: Skin is warm and dry.  Neurological:     Mental Status: Amanda A [place] "[name]" is alert.  Psychiatric:        Behavior: Behavior normal.     ED Results / Procedures / Treatments   Labs (all labs ordered are listed, but only abnormal results are displayed) Labs Reviewed  CBC - Abnormal; Notable for the following components:      Result Value   WBC 14.1 (*)    RBC 3.21 (*)    Hemoglobin 8.7 (*)    HCT 28.8 (*)    All other components within normal limits  RESP PANEL BY RT-PCR (RSV, FLU A&B, COVID)  RVPGX2  BASIC METABOLIC PANEL  URINALYSIS, ROUTINE W REFLEX MICROSCOPIC  CBG MONITORING, ED    EKG None  Radiology No results found.  Procedures Procedures    Medications Ordered in ED Medications - No data to display  ED Course/ Medical Decision Making/ A&P Clinical Course as of 12/26/22 1528  Fri Dec 26, 2022  1524 Signed out pending labs and  imaging. [CG]    Clinical Course User Index [CG] Azucena Cecil, PA-C                             Medical Decision Making Amount and/or Complexity of Data Reviewed Labs: ordered. Radiology: ordered.   Pt presents with concerns for generalized weakness onset 1 week. Vital signs, patient afebrile. On exam, pt with no acute cardiovascular, respiratory, abdominal exam findings. Differential diagnosis includes anemia, hypoglycemia, COVID, flu, RSV, pneumonia, acute cystitis, electrolyte abnormality.    Co morbidities  that complicate the patient evaluation: Hyperlipidemia COPD Dementia Hypertension Diabetes  Labs:  I ordered, and personally interpreted labs.  The pertinent results include:   CBG, BMP, Urinalysis ordered with results pending at time of sign out.  CBC with leukocytosis at 14.1 COVID, flu, RSV swab ordered results pending at time of signout  Imaging: I ordered imaging studies including Chest x-ray CT head without ordered with results pending at time of sign out.   Patient case discussed with Astrid Drafts, PA-C at sign-out. Plan at sign-out is pending lab/imaging studies, likely discharge home if able to ambulate without exacerbation of symptoms, however, plans may change as per oncoming team. Patient care transferred at sign out.    This chart was dictated using voice recognition software, Dragon. Despite the best efforts of this provider to proofread and correct errors, errors may still occur which can change documentation meaning.  Final Clinical Impression(s) / ED Diagnoses Final diagnoses:  Generalized weakness    Rx / DC Orders ED Discharge Orders     None         Mao Lockner A, PA-C 12/26/22 1528    Jeanell Sparrow, DO 12/28/22 1139

## 2022-12-27 DIAGNOSIS — R531 Weakness: Secondary | ICD-10-CM | POA: Diagnosis not present

## 2022-12-27 MED ORDER — MELATONIN 5 MG PO TABS
10.0000 mg | ORAL_TABLET | Freq: Every day | ORAL | Status: DC
  Administered 2022-12-28: 10 mg via ORAL
  Filled 2022-12-27 (×2): qty 2

## 2022-12-27 MED ORDER — ACETAMINOPHEN 325 MG PO TABS
650.0000 mg | ORAL_TABLET | Freq: Once | ORAL | Status: AC
  Administered 2022-12-27: 650 mg via ORAL
  Filled 2022-12-27: qty 2

## 2022-12-27 MED ORDER — ALBUTEROL SULFATE (2.5 MG/3ML) 0.083% IN NEBU: 2.5000 mg | INHALATION_SOLUTION | RESPIRATORY_TRACT | Status: DC | PRN

## 2022-12-27 MED ORDER — ROSUVASTATIN CALCIUM 20 MG PO TABS
20.0000 mg | ORAL_TABLET | Freq: Every evening | ORAL | Status: DC
  Administered 2022-12-28: 20 mg via ORAL
  Filled 2022-12-27 (×2): qty 1

## 2022-12-27 MED ORDER — AMLODIPINE BESYLATE 5 MG PO TABS
2.5000 mg | ORAL_TABLET | Freq: Every day | ORAL | Status: DC
  Administered 2022-12-28 – 2022-12-29 (×2): 2.5 mg via ORAL
  Filled 2022-12-27 (×3): qty 1

## 2022-12-27 MED ORDER — PANTOPRAZOLE SODIUM 40 MG PO TBEC
40.0000 mg | DELAYED_RELEASE_TABLET | Freq: Every day | ORAL | Status: DC
  Administered 2022-12-28 – 2022-12-29 (×2): 40 mg via ORAL
  Filled 2022-12-27 (×3): qty 1

## 2022-12-27 MED ORDER — ALBUTEROL SULFATE HFA 108 (90 BASE) MCG/ACT IN AERS: 2.0000 | INHALATION_SPRAY | RESPIRATORY_TRACT | Status: DC | PRN

## 2022-12-27 MED ORDER — ASPIRIN 81 MG PO CHEW
81.0000 mg | CHEWABLE_TABLET | Freq: Every day | ORAL | Status: DC
  Administered 2022-12-28 – 2022-12-29 (×2): 81 mg via ORAL
  Filled 2022-12-27 (×3): qty 1

## 2022-12-27 MED ORDER — BUPROPION HCL ER (XL) 150 MG PO TB24
300.0000 mg | ORAL_TABLET | Freq: Every day | ORAL | Status: DC
  Administered 2022-12-28 – 2022-12-29 (×2): 300 mg via ORAL
  Filled 2022-12-27 (×2): qty 2

## 2022-12-27 MED ORDER — MEMANTINE HCL 5 MG PO TABS
5.0000 mg | ORAL_TABLET | Freq: Two times a day (BID) | ORAL | Status: DC
  Administered 2022-12-28 – 2022-12-29 (×3): 5 mg via ORAL
  Filled 2022-12-27 (×4): qty 1

## 2022-12-27 MED ORDER — CLONAZEPAM 1 MG PO TABS
1.0000 mg | ORAL_TABLET | Freq: Every day | ORAL | Status: DC
  Administered 2022-12-28: 1 mg via ORAL
  Filled 2022-12-27 (×2): qty 1

## 2022-12-27 MED ORDER — ARFORMOTEROL TARTRATE 15 MCG/2ML IN NEBU
15.0000 ug | INHALATION_SOLUTION | Freq: Two times a day (BID) | RESPIRATORY_TRACT | Status: DC
  Administered 2022-12-28 (×2): 15 ug via RESPIRATORY_TRACT
  Filled 2022-12-27 (×5): qty 2

## 2022-12-27 MED ORDER — UMECLIDINIUM BROMIDE 62.5 MCG/ACT IN AEPB
1.0000 | INHALATION_SPRAY | Freq: Every day | RESPIRATORY_TRACT | Status: DC
  Administered 2022-12-28 – 2022-12-29 (×2): 1 via RESPIRATORY_TRACT
  Filled 2022-12-27: qty 7

## 2022-12-27 MED ORDER — GLIPIZIDE ER 2.5 MG PO TB24
2.5000 mg | ORAL_TABLET | Freq: Every day | ORAL | Status: DC
  Administered 2022-12-28 – 2022-12-29 (×2): 2.5 mg via ORAL
  Filled 2022-12-27 (×2): qty 1

## 2022-12-27 MED ORDER — BUSPIRONE HCL 10 MG PO TABS
30.0000 mg | ORAL_TABLET | Freq: Two times a day (BID) | ORAL | Status: DC
  Administered 2022-12-28 – 2022-12-29 (×3): 30 mg via ORAL
  Filled 2022-12-27 (×4): qty 3

## 2022-12-27 MED ORDER — BUDESON-GLYCOPYRROL-FORMOTEROL 160-9-4.8 MCG/ACT IN AERO: 2.0000 | INHALATION_SPRAY | Freq: Two times a day (BID) | RESPIRATORY_TRACT | Status: DC

## 2022-12-27 NOTE — ED Notes (Signed)
Patient remains asleep.  Respirations even and unlabored.  Will continue to monitor

## 2022-12-27 NOTE — NC FL2 (Signed)
[place] LEVEL OF CARE FORM     IDENTIFICATION  Patient Name: Amanda Porter Birthdate: 1946/05/18 Sex: adult Admission Date (Current Location): 12/26/2022  [facility] and [place] Number:  Herbalist and Address:  [facility],  [address] [place], [place]      Provider Number: [id number]  Attending Physician Name and Address:  Default, Provider, MD  Relative Name and Phone Number:  [name] (daughter) - [phone]    Current Level of Care: Hospital Recommended Level of Care: [place] Prior Approval Number:    Date Approved/Denied:   PASRR Number: [id number]  Discharge Plan: SNF    Current Diagnoses: Patient Active Problem List   Diagnosis Date Noted   Malnutrition of moderate degree 12/18/2022   HCAP (healthcare-associated pneumonia) 12/16/2022   COPD (chronic obstructive pulmonary disease) ([place]) 12/16/2022   Dark stools 12/16/2022   Diabetes mellitus without complication ([place]) 94/17/4081   GERD (gastroesophageal reflux disease) 12/16/2022   Fall at home, initial encounter 12/16/2022   Blood in stool 12/16/2022   Angiodysplasia of stomach and duodenum 12/16/2022   Bleeding duodenal ulcer 12/16/2022   Status post right hip replacement 10/13/2022   Shunt malfunction    Closed displaced fracture of right femoral neck ([place]) 09/06/2022   Preoperative clearance 08/18/2022   Multiple lung nodules 08/03/2022   Acute urinary retention 08/01/2022   Normocytic anemia 07/29/2022   NPH (normal pressure hydrocephalus) ([place]) 07/25/2022   Recurrent UTI 06/24/2022   Recurrent falls 06/24/2022   DNR (do not resuscitate) 06/24/2022   S/P VP shunt 04/22/2022   Subdural hematoma ([employer]) 04/16/2022   Pleural effusion on left 04/08/2022   Decreased urine output 04/04/2022   Vitamin D deficiency 04/04/2022   Other fatigue 04/04/2022   Hypokalemia 04/04/2022   Urinary retention 12/13/2021   Wrist fracture, closed, left,  sequela 12/13/2021   Protein-calorie malnutrition, severe 11/07/2021   Tardive dyskinesia 02/01/2020   Cervical spondylolysis 05/24/2019   Chronic respiratory failure with hypoxia ([place]) 02/25/2019   Postural dizziness 08/11/2018   Decreased hemoglobin 07/02/2018   Anemia 04/12/2018   Type 2 diabetes mellitus with hyperglycemia ([place]) 02/18/2018   Pain of left lower extremity 11/26/2017   Memory disturbance 11/20/2017   Right carotid bruit 11/20/2017   Hyperlipidemia 09/14/2017   COPD mixed type ([place]) 09/14/2017   Dementia without behavioral disturbance ([place]) 09/14/2017   Normal pressure hydrocephalus ([place]) 09/14/2017   Dysphagia 09/14/2017   Essential hypertension 09/14/2017   History of CVA (cerebrovascular accident) 09/14/2017   Anxiety and depression 09/14/2017   Tobacco use disorder 12/10/2016    Orientation RESPIRATION BLADDER Height & Weight     Self  O2 (3L Continuous at baseline reported during triage. Currently on 2L Nasal Cannula) Incontinent Weight: 100 lb 1.4 oz (45.4 kg) Height:  5\' 2"  (157.5 cm)  BEHAVIORAL SYMPTOMS/MOOD NEUROLOGICAL BOWEL NUTRITION STATUS      Incontinent Diet (Regular)  AMBULATORY STATUS COMMUNICATION OF NEEDS Skin   Independent (Independent w/DME ([place])) Verbally Normal                       Personal Care Assistance Level of Assistance  Bathing, Feeding, Dressing Bathing Assistance: Limited assistance Feeding assistance: Independent Dressing Assistance: Limited assistance     Functional Limitations Info  Sight, Hearing, Speech Sight Info: Adequate Hearing Info: Adequate Speech Info: Adequate    SPECIAL CARE FACTORS FREQUENCY  PT (By licensed PT), OT (By licensed OT)  PT Frequency: x5/week OT Frequency: x5/week            Contractures Contractures Info: Not present    Additional Factors Info  Code Status, Allergies, Psychotropic Code Status Info: Full Allergies Info: Lexapro (Escitalopram Oxalate)   Neosporin  (Neomycin-polymyxin-gramicidin)  Neosporin (Bacitracin-polymyxin B) Psychotropic Info: None         Current Medications (12/27/2022):  This is the current hospital active medication list No current facility-administered medications for this encounter.   Current Outpatient Medications  Medication Sig Dispense Refill   albuterol (VENTOLIN HFA) 108 (90 Base) MCG/ACT inhaler INHALE 2 PUFFS BY MOUTH EVERY 4 HOURS AS NEEDED (Patient taking differently: Inhale 2 puffs into the lungs every 4 (four) hours as needed for wheezing or shortness of breath.) 18 g 12   Albuterol Sulfate 2.5 MG/0.5ML NEBU Inhale 2.5 mg into the lungs every 6 (six) hours as needed (shortness of breath).     amLODipine (NORVASC) 2.5 MG tablet Take 1 tablet (2.5 mg total) by mouth daily. for blood pressure. 90 tablet 1   ascorbic acid (VITAMIN C) 500 MG tablet Take 1 tablet (500 mg total) by mouth daily. 30 tablet 2   aspirin 81 MG chewable tablet Chew 1 tablet (81 mg total) by mouth daily. Hold for now and resume on 12/22/22     Budeson-Glycopyrrol-Formoterol (BREZTRI AEROSPHERE) 160-9-4.8 MCG/ACT AERO Inhale 2 puffs into the lungs in the morning and at bedtime. (Patient not taking: Reported on 12/16/2022) 2 each 0   buPROPion (WELLBUTRIN XL) 300 MG 24 hr tablet Take 1 tablet (300 mg total) by mouth daily with breakfast. for anxiety and depression. 90 tablet 0   busPIRone (BUSPAR) 15 MG tablet Take 30 mg by mouth 2 (two) times daily.     Calcium Carb-Cholecalciferol (CALCIUM/VITAMIN D PO) Take 1 tablet by mouth daily.     clonazePAM (KLONOPIN) 1 MG tablet Take 1 mg by mouth at bedtime.     conjugated estrogens (PREMARIN) vaginal cream Apply one pea-sized amount around the opening of the urethra daily for 2 weeks, then 3 times weekly moving forward. 30 g 4   Continuous Blood Gluc Sensor (FREESTYLE LIBRE 3 SENSOR) MISC Place 1 sensor on the skin every 14 days. Use to check glucose continuously 6 each 1   ferrous sulfate  325 (65 FE) MG tablet Take 1 tablet (325 mg total) by mouth daily with breakfast. 30 tablet 2   glipiZIDE (GLUCOTROL XL) 2.5 MG 24 hr tablet TAKE 1 TABLET (2.5 MG TOTAL) BY MOUTH DAILY WITH BREAKFAST. FOR DIABETES. 90 tablet 0   lactobacillus acidophilus & bulgar (LACTINEX) chewable tablet Chew 1 tablet by mouth 3 (three) times daily with meals for 10 days. 30 tablet 0   Melatonin 10 MG TABS Take 10 mg by mouth at bedtime.     memantine (NAMENDA) 5 MG tablet Take 1 tablet (5 mg total) by mouth 2 (two) times daily. For memory. 180 tablet 3   pantoprazole (PROTONIX) 40 MG tablet Take 1 tablet (40 mg total) by mouth daily. 30 tablet 2   rosuvastatin (CRESTOR) 20 MG tablet TAKE 1 TABLET (20 MG TOTAL) BY MOUTH EVERY EVENING. FOR CHOLESTEROL. 90 tablet 3     Discharge Medications: Please see discharge summary for a list of discharge medications.  Relevant Imaging Results:  Relevant Lab Results:   Additional Information SSN: [id number]  Kimber Relic, LCSW

## 2022-12-27 NOTE — Progress Notes (Addendum)
Pt faxed out. Bed offers pending. TOC anticipates delays in responses due to weekend.    Addend @ 11:47 AM Amanda Porter has offered a bed and [place], admissions rep, will submit Auth. This CSW has selected [facility] in the hub. This CSW has also notified the pt's daughter. Daughter did request someone reach out to her so she can confirm medications. EDP and Paramedic notified via secure chat.   Addend @ 12:11 PM Auth initiated by Sharyn Lull - reference #: [id number]  Addend @ 3:47 PM Auth still pending.

## 2022-12-27 NOTE — ED Notes (Signed)
Bed alarm placed on bed.

## 2022-12-27 NOTE — Progress Notes (Signed)
Transition of Care [facility]) - Emergency Department Mini Assessment   Patient Details  Name: Amanda Porter MRN: [id number] Date of Birth: 10/29/1946  [facility] of [facility]) CM/SW Contact:    Kimber Relic, LCSW Phone Number: 12/27/2022, 10:06 AM   Clinical Narrative: Pt presented to ED with weakness and possible UTI. Pt's daughter, [name], interested in SNF. This CSW spoke to [place] who states she is only interested in the pt going for short term rehab. This CSW explained the SNF workup process. Family's preferred SNFs are [facility], [facility], or [facility]. TOC following for PT eval.    ED Mini Assessment: What brought you to the Emergency Department? : weakness to stand/possible UTI        Means of departure: Not know       Patient Contact and Communications Key Contact 1: Daughter - [name]   Spoke with: Kingsley Spittle Date: 12/27/22,   Contact time: 1937 Contact Phone Number: [phone] Call outcome: Daugher is interested in [place] at SNF  Patient states their goals for this hospitalization and ongoing recovery are:: SNF CMS Medicare.gov Compare Post Acute Care list provided to:: Other (Comment Required) (Daughter) Choice offered to / list presented to : Adult Children  Admission diagnosis:  possible uti, increased generalized weakness Patient Active Problem List   Diagnosis Date Noted   Malnutrition of moderate degree 12/18/2022   HCAP (healthcare-associated pneumonia) 12/16/2022   COPD (chronic obstructive pulmonary disease) ([place]) 12/16/2022   Dark stools 12/16/2022   Diabetes mellitus without complication ([place]) 29/92/4268   GERD (gastroesophageal reflux disease) 12/16/2022   Fall at home, initial encounter 12/16/2022   Blood in stool 12/16/2022   Angiodysplasia of stomach and duodenum 12/16/2022   Bleeding duodenal ulcer 12/16/2022   Status post right hip replacement 10/13/2022   Shunt malfunction    Closed displaced fracture of right femoral neck  ([place]) 09/06/2022   Preoperative clearance 08/18/2022   Multiple lung nodules 08/03/2022   Acute urinary retention 08/01/2022   Normocytic anemia 07/29/2022   NPH (normal pressure hydrocephalus) ([place]) 07/25/2022   Recurrent UTI 06/24/2022   Recurrent falls 06/24/2022   DNR (do not resuscitate) 06/24/2022   S/P VP shunt 04/22/2022   Subdural hematoma ([place]) 04/16/2022   Pleural effusion on left 04/08/2022   Decreased urine output 04/04/2022   Vitamin D deficiency 04/04/2022   Other fatigue 04/04/2022   Hypokalemia 04/04/2022   Urinary retention 12/13/2021   Wrist fracture, closed, left, sequela 12/13/2021   Protein-calorie malnutrition, severe 11/07/2021   Tardive dyskinesia 02/01/2020   Cervical spondylolysis 05/24/2019   Chronic respiratory failure with hypoxia ([place]) 02/25/2019   Postural dizziness 08/11/2018   Decreased hemoglobin 07/02/2018   Anemia 04/12/2018   Type 2 diabetes mellitus with hyperglycemia ([place]) 02/18/2018   Pain of left lower extremity 11/26/2017   Memory disturbance 11/20/2017   Right carotid bruit 11/20/2017   Hyperlipidemia 09/14/2017   COPD mixed type ([place]) 09/14/2017   Dementia without behavioral disturbance ([place]) 09/14/2017   Normal pressure hydrocephalus ([place]) 09/14/2017   Dysphagia 09/14/2017   Essential hypertension 09/14/2017   History of CVA (cerebrovascular accident) 09/14/2017   Anxiety and depression 09/14/2017   Tobacco use disorder 12/10/2016   PCP:  Pleas Koch, NP Pharmacy:   [id number] - [place], [place] [address] Phone: [phone] Fax: [phone]

## 2022-12-27 NOTE — ED Notes (Signed)
Pt ambulated to restroom with walker for assistance

## 2022-12-27 NOTE — ED Notes (Signed)
Placed pt on 2LPM oxygen due to sats 85%

## 2022-12-27 NOTE — Evaluation (Signed)
Physical Therapy Evaluation Patient Details Name: Amanda Porter MRN: [id number] DOB: 1946-01-18 Today's Date: 12/27/2022  History of Present Illness  Amanda Porter is a 77 y.o. adult with medical history significant of HTN, HLD, COPD on 3L O2, stroke, dementia, depression with anxiety, NPH (s/p of Vp shunt), lung cancer, SDH, recent stay for HCAP now presents with FTT and recurrent UTI.  Clinical Impression  Patient is a poor historian, but relates does have some help at home (daughter vs. Sister) but relates recent falls and difficulty managing.  She presents with generalized weakness, decreased balance, dizziness, decreased activity tolerance and will benefit from skilled PT in the acute setting and could benefit from STSNF level rehab prior to d/c home with continued family support.         Recommendations for follow up therapy are one component of a multi-disciplinary discharge planning process, led by the attending physician.  Recommendations may be updated based on patient status, additional functional criteria and insurance authorization.  Follow Up Recommendations Skilled nursing-short term rehab (<3 hours/day) Can patient physically be transported by private vehicle: Yes    Assistance Recommended at Discharge Frequent or constant Supervision/Assistance  Patient can return home with the following  A little help with walking and/or transfers;A little help with bathing/dressing/bathroom;Help with stairs or ramp for entrance;Direct supervision/assist for medications management;Assistance with cooking/housework    Equipment Recommendations None recommended by PT  Recommendations for Other Services       Functional Status Assessment Patient has had a recent decline in their functional status and demonstrates the ability to make significant improvements in function in a reasonable and predictable amount of time.     Precautions / Restrictions Precautions Precautions: Fall       Mobility  Bed Mobility Overal bed mobility: Needs Assistance Bed Mobility: Supine to Sit, Sit to Supine     Supine to sit: Min assist Sit to supine: Min assist   General bed mobility comments: assist to lift trunk to sit and for legs onto stretcher to supine    Transfers Overall transfer level: Needs assistance Equipment used: Rolling walker (2 wheels) Transfers: Sit to/from Stand Sit to Stand: Min assist           General transfer comment: up from high stretcher assist for safety and balance    Ambulation/Gait Ambulation/Gait assistance: Min assist Gait Distance (Feet): 25 Feet Assistive device: Rolling walker (2 wheels) Gait Pattern/deviations: Step-through pattern, Step-to pattern, Trunk flexed, Decreased stride length, Shuffle       General Gait Details: assist for balance and support increased throughout as pt endorsing dizziness, cues for visualizing targets to help with orientation and balance  Stairs            Wheelchair Mobility    Modified Rankin (Stroke Patients Only)       Balance Overall balance assessment: Needs assistance Sitting-balance support: Feet unsupported Sitting balance-Leahy Scale: Fair Sitting balance - Comments: on edge of stretcher feet not reaching the ground   Standing balance support: Reliant on assistive device for balance, Bilateral upper extremity supported Standing balance-Leahy Scale: Poor                               Pertinent Vitals/Pain Pain Assessment Pain Assessment: Faces Faces Pain Scale: Hurts even more Pain Location: R back of head/neck Pain Descriptors / Indicators: Aching, Discomfort Pain Intervention(s): Monitored during session, Patient requesting pain meds-RN notified  Home Living Family/patient expects to be discharged to:: Private residence   Available Help at Discharge: Family;Available PRN/intermittently Type of Home: House Home Access: Level entry       Home Layout:  One level Home Equipment: Rollator (4 wheels);Shower seat;Grab bars - tub/shower Additional Comments: Info above from previous admission, patient relates she lives with her sister who helps her with meals, showers, states she dresses herself, states has two level home, but stays on main level and has a RW she uses.  States last fall 2 days ago.  Also states she is at [facility] in [place] Prior Level of Function : Patient poor historian/Family not available               ADLs Comments: Pt reports assit required for ADL/IADL     Hand Dominance   Dominant Hand: Right    Extremity/Trunk Assessment   Upper Extremity Assessment Upper Extremity Assessment: Overall WFL for tasks assessed    Lower Extremity Assessment Lower Extremity Assessment: Generalized weakness    Cervical / Trunk Assessment Cervical / Trunk Assessment: Normal  Communication   Communication: No difficulties  Cognition Arousal/Alertness: Awake/alert Behavior During Therapy: WFL for tasks assessed/performed Overall Cognitive Status: History of cognitive impairments - at baseline                                          General Comments General comments (skin integrity, edema, etc.): on 2L O2 SpO2 with ambulation 91%    Exercises     Assessment/Plan    PT Assessment Patient needs continued PT services  PT Problem List Decreased strength;Decreased activity tolerance;Decreased mobility;Decreased balance;Decreased safety awareness       PT Treatment Interventions Therapeutic exercise;Gait training;Stair training;Functional mobility training;Therapeutic activities;Patient/family education    PT Goals (Current goals can be found in the Care Plan section)  Acute Rehab PT Goals Patient Stated Goal: agreeable to rehab PT Goal Formulation: With patient Time For Goal Achievement: 01/10/23 Potential to Achieve Goals: Fair    Frequency Min 2X/week     Co-evaluation                AM-PAC PT "6 Clicks" Mobility  Outcome Measure Help needed turning from your back to your side while in a flat bed without using bedrails?: A Little Help needed moving from lying on your back to sitting on the side of a flat bed without using bedrails?: A Little Help needed moving to and from a bed to a chair (including a wheelchair)?: A Little Help needed standing up from a chair using your arms (e.g., wheelchair or bedside chair)?: A Little Help needed to walk in hospital room?: A Little Help needed climbing 3-5 steps with a railing? : Total 6 Click Score: 16    End of Session Equipment Utilized During Treatment: Gait belt;Oxygen Activity Tolerance: Patient limited by fatigue Patient left: in bed   PT Visit Diagnosis: Other abnormalities of gait and mobility (R26.89);Muscle weakness (generalized) (M62.81);Dizziness and giddiness (R42)    Time: 4917-9150 PT Time Calculation (min) (ACUTE ONLY): 20 min   Charges:   PT Evaluation $PT Eval Moderate Complexity: 1 Mod          Cyndi Meosha Castanon, PT Acute Rehabilitation Services Office:[phone] 12/27/2022   Reginia Naas 12/27/2022, 11:10 AM

## 2022-12-28 ENCOUNTER — Emergency Department (HOSPITAL_COMMUNITY): Payer: Medicare PPO

## 2022-12-28 DIAGNOSIS — R531 Weakness: Secondary | ICD-10-CM | POA: Diagnosis not present

## 2022-12-28 DIAGNOSIS — Z982 Presence of cerebrospinal fluid drainage device: Secondary | ICD-10-CM | POA: Diagnosis not present

## 2022-12-28 LAB — CBC WITH DIFFERENTIAL/PLATELET
Abs Immature Granulocytes: 0.02 10*3/uL (ref 0.00–0.07)
Basophils Absolute: 0.1 10*3/uL (ref 0.0–0.1)
Basophils Relative: 1 %
Eosinophils Absolute: 0.2 10*3/uL (ref 0.0–0.5)
Eosinophils Relative: 2 %
HCT: 33 % — ABNORMAL LOW (ref 36.0–46.0)
Hemoglobin: 9.7 g/dL — ABNORMAL LOW (ref 12.0–15.0)
Immature Granulocytes: 0 %
Lymphocytes Relative: 26 %
Lymphs Abs: 2 10*3/uL (ref 0.7–4.0)
MCH: 26.1 pg (ref 26.0–34.0)
MCHC: 29.4 g/dL — ABNORMAL LOW (ref 30.0–36.0)
MCV: 88.7 fL (ref 80.0–100.0)
Monocytes Absolute: 0.6 10*3/uL (ref 0.1–1.0)
Monocytes Relative: 7 %
Neutro Abs: 4.9 10*3/uL (ref 1.7–7.7)
Neutrophils Relative %: 64 %
Platelets: 356 10*3/uL (ref 150–400)
RBC: 3.72 MIL/uL — ABNORMAL LOW (ref 3.87–5.11)
RDW: 15.2 % (ref 11.5–15.5)
WBC: 7.8 10*3/uL (ref 4.0–10.5)
nRBC: 0 % (ref 0.0–0.2)

## 2022-12-28 LAB — CBG MONITORING, ED: Glucose-Capillary: 144 mg/dL — ABNORMAL HIGH (ref 70–99)

## 2022-12-28 LAB — URINE CULTURE: Culture: NO GROWTH

## 2022-12-28 NOTE — ED Notes (Signed)
Patient provided with apple sauce and orange juice per request

## 2022-12-28 NOTE — ED Provider Notes (Signed)
Emergency Medicine Observation Re-evaluation Note  Amanda Porter is a 77 y.o. adult, seen on rounds today.  Pt initially presented to the ED for complaints of No chief complaint on file. Currently, the patient is awaiting SNF placement.  In the hospital last week for pneumonia. Was discharged, concern for possible UTI but it was not cultured. Came home, still seems to be confused and weak after hospitalization for pneumonia, saying she was too weak.  Frozen shoulder left side. Currently in home OT/PT. Shunt replaced October.  Calls her dizziness headaches, is chronic.  Physical Exam  BP (!) 141/53 (BP Location: Left Arm)   Pulse 85   Temp (!) 97.5 F (36.4 C) (Oral)   Resp 18   Ht 5\' 2"  (1.575 m)   Wt 45.4 kg   SpO2 100%   BMI 18.31 kg/m  Physical Exam General: NAD Cardiac: RRR Lungs: CTAB Neuro:  Mild facial asymmetry with smiling, left shoulder pain limiting movement, no weakness LE.  Normal speech, eye movements, visual fields, EOM normal, normal sensation  ED Course / MDM  EKG:EKG Interpretation  Date/Time:  Friday December 26 2022 15:38:06 EST Ventricular Rate:  89 PR Interval:  135 QRS Duration: 110 QT Interval:  373 QTC Calculation: 454 R Axis:   25 Text Interpretation: Sinus rhythm Anteroseptal infarct, age indeterminate No significant change since last tracing Confirmed by Georgina Snell [phone]) on 12/26/2022 6:01:33 PM  I have reviewed the labs performed to date as well as medications administered while in observation.     Discussed with daughter and performed more detailed Nueorlogic exam.  While she is noted to have prior left facial weakness in the past, her daughter reports she had complete recovery and is noted today to have asymmetric smile. Given her difficulty ambulating and left facial weakness with LUE also difficult to assess with frozen shoulder, ordered MRI to evaluate for acute CVA.  MRI completed and shows no evidence of acute CVA, does have white  matter disase which is nonspecific and can be followed as outpatient with Neurology.  Dr. Christella Noa [facility] came to bedside to reprogram her VP shunt following the MRI.  Did discuss possibiity of shunt malfunction as etiology of difficulty walking however CT is unchanged from prior and it is recommended she follow up with her primary neurosurgeon.  No indication for emergent surgery or admission. Labs repeated today no longer show WBC.    Plan  Current plan is for SNF placement, outpt follow up NSU and Neurology.     Gareth Morgan, MD 12/29/22 [phone]

## 2022-12-28 NOTE — ED Notes (Signed)
Patient awake and reports she became incontinent while sleeping.  Patient assisted to chair and bed linens changed.  Provided with new gown and blankets.  Patient reports she feel better after "resting through the night."  No complaints voiced at this time.  External catheter placed at this time

## 2022-12-28 NOTE — ED Notes (Signed)
Daughter called to check in on patient.  Update provided.

## 2022-12-28 NOTE — ED Notes (Signed)
Patient remains asleep.  Respirations even and unlabored.  RN into room and patient continues to rest quietly.   Will administer medications later

## 2022-12-28 NOTE — ED Notes (Signed)
Pt tx to MRI

## 2022-12-28 NOTE — ED Provider Notes (Signed)
Emergency Medicine Observation Re-evaluation Note  Amanda Porter is a 77 y.o. adult, seen on rounds today.  Pt initially presented to the ED for complaints of No chief complaint on file. Currently, the patient is awaiting SNF placement.  Physical Exam  BP (!) 141/53 (BP Location: Left Arm)   Pulse 85   Temp (!) 97.5 F (36.4 C) (Oral)   Resp 18   Ht 5\' 2"  (1.575 m)   Wt 45.4 kg   SpO2 100%   BMI 18.31 kg/m  Physical Exam General: NAD Cardiac: RR Lungs: even unlabored Psych: n/a  ED Course / MDM  EKG:EKG Interpretation  Date/Time:  Friday December 26 2022 15:38:06 EST Ventricular Rate:  89 PR Interval:  135 QRS Duration: 110 QT Interval:  373 QTC Calculation: 454 R Axis:   25 Text Interpretation: Sinus rhythm Anteroseptal infarct, age indeterminate No significant change since last tracing Confirmed by Georgina Snell [phone]) on 12/26/2022 6:01:33 PM  I have reviewed the labs performed to date as well as medications administered while in observation.  Recent changes in the last 24 hours include none.  Plan  Current plan is for SNF placement.    Gareth Morgan, MD 12/29/22 [phone]

## 2022-12-29 DIAGNOSIS — T85618D Breakdown (mechanical) of other specified internal prosthetic devices, implants and grafts, subsequent encounter: Secondary | ICD-10-CM | POA: Diagnosis not present

## 2022-12-29 DIAGNOSIS — S0181XA Laceration without foreign body of other part of head, initial encounter: Secondary | ICD-10-CM | POA: Diagnosis not present

## 2022-12-29 DIAGNOSIS — R410 Disorientation, unspecified: Secondary | ICD-10-CM | POA: Diagnosis not present

## 2022-12-29 DIAGNOSIS — J9621 Acute and chronic respiratory failure with hypoxia: Secondary | ICD-10-CM | POA: Diagnosis not present

## 2022-12-29 DIAGNOSIS — J440 Chronic obstructive pulmonary disease with acute lower respiratory infection: Secondary | ICD-10-CM | POA: Diagnosis not present

## 2022-12-29 DIAGNOSIS — I10 Essential (primary) hypertension: Secondary | ICD-10-CM | POA: Diagnosis not present

## 2022-12-29 DIAGNOSIS — F411 Generalized anxiety disorder: Secondary | ICD-10-CM | POA: Diagnosis not present

## 2022-12-29 DIAGNOSIS — Z8673 Personal history of transient ischemic attack (TIA), and cerebral infarction without residual deficits: Secondary | ICD-10-CM | POA: Diagnosis not present

## 2022-12-29 DIAGNOSIS — J441 Chronic obstructive pulmonary disease with (acute) exacerbation: Secondary | ICD-10-CM | POA: Diagnosis not present

## 2022-12-29 DIAGNOSIS — I959 Hypotension, unspecified: Secondary | ICD-10-CM | POA: Diagnosis not present

## 2022-12-29 DIAGNOSIS — W19XXXA Unspecified fall, initial encounter: Secondary | ICD-10-CM | POA: Diagnosis not present

## 2022-12-29 DIAGNOSIS — R278 Other lack of coordination: Secondary | ICD-10-CM | POA: Diagnosis not present

## 2022-12-29 DIAGNOSIS — Z7982 Long term (current) use of aspirin: Secondary | ICD-10-CM | POA: Diagnosis not present

## 2022-12-29 DIAGNOSIS — M25512 Pain in left shoulder: Secondary | ICD-10-CM | POA: Diagnosis not present

## 2022-12-29 DIAGNOSIS — S7291XD Unspecified fracture of right femur, subsequent encounter for closed fracture with routine healing: Secondary | ICD-10-CM | POA: Diagnosis not present

## 2022-12-29 DIAGNOSIS — M47812 Spondylosis without myelopathy or radiculopathy, cervical region: Secondary | ICD-10-CM | POA: Diagnosis not present

## 2022-12-29 DIAGNOSIS — J9611 Chronic respiratory failure with hypoxia: Secondary | ICD-10-CM | POA: Diagnosis not present

## 2022-12-29 DIAGNOSIS — R911 Solitary pulmonary nodule: Secondary | ICD-10-CM | POA: Diagnosis not present

## 2022-12-29 DIAGNOSIS — R41841 Cognitive communication deficit: Secondary | ICD-10-CM | POA: Diagnosis not present

## 2022-12-29 DIAGNOSIS — R0902 Hypoxemia: Secondary | ICD-10-CM | POA: Diagnosis not present

## 2022-12-29 DIAGNOSIS — F445 Conversion disorder with seizures or convulsions: Secondary | ICD-10-CM | POA: Diagnosis not present

## 2022-12-29 DIAGNOSIS — F0390 Unspecified dementia without behavioral disturbance: Secondary | ICD-10-CM | POA: Diagnosis not present

## 2022-12-29 DIAGNOSIS — J939 Pneumothorax, unspecified: Secondary | ICD-10-CM | POA: Diagnosis not present

## 2022-12-29 DIAGNOSIS — R2689 Other abnormalities of gait and mobility: Secondary | ICD-10-CM | POA: Diagnosis not present

## 2022-12-29 DIAGNOSIS — Y92129 Unspecified place in nursing home as the place of occurrence of the external cause: Secondary | ICD-10-CM | POA: Diagnosis not present

## 2022-12-29 DIAGNOSIS — J9811 Atelectasis: Secondary | ICD-10-CM | POA: Diagnosis not present

## 2022-12-29 DIAGNOSIS — Z1152 Encounter for screening for COVID-19: Secondary | ICD-10-CM | POA: Diagnosis not present

## 2022-12-29 DIAGNOSIS — F331 Major depressive disorder, recurrent, moderate: Secondary | ICD-10-CM | POA: Diagnosis not present

## 2022-12-29 DIAGNOSIS — F02B3 Dementia in other diseases classified elsewhere, moderate, with mood disturbance: Secondary | ICD-10-CM | POA: Diagnosis not present

## 2022-12-29 DIAGNOSIS — J439 Emphysema, unspecified: Secondary | ICD-10-CM | POA: Diagnosis not present

## 2022-12-29 DIAGNOSIS — S199XXA Unspecified injury of neck, initial encounter: Secondary | ICD-10-CM | POA: Diagnosis not present

## 2022-12-29 DIAGNOSIS — Z043 Encounter for examination and observation following other accident: Secondary | ICD-10-CM | POA: Diagnosis not present

## 2022-12-29 DIAGNOSIS — D638 Anemia in other chronic diseases classified elsewhere: Secondary | ICD-10-CM | POA: Diagnosis not present

## 2022-12-29 DIAGNOSIS — M6281 Muscle weakness (generalized): Secondary | ICD-10-CM | POA: Diagnosis not present

## 2022-12-29 DIAGNOSIS — W01198A Fall on same level from slipping, tripping and stumbling with subsequent striking against other object, initial encounter: Secondary | ICD-10-CM | POA: Diagnosis not present

## 2022-12-29 DIAGNOSIS — R296 Repeated falls: Secondary | ICD-10-CM | POA: Diagnosis not present

## 2022-12-29 DIAGNOSIS — E119 Type 2 diabetes mellitus without complications: Secondary | ICD-10-CM | POA: Diagnosis not present

## 2022-12-29 DIAGNOSIS — E1165 Type 2 diabetes mellitus with hyperglycemia: Secondary | ICD-10-CM | POA: Diagnosis not present

## 2022-12-29 DIAGNOSIS — Z7401 Bed confinement status: Secondary | ICD-10-CM | POA: Diagnosis not present

## 2022-12-29 DIAGNOSIS — R531 Weakness: Secondary | ICD-10-CM | POA: Diagnosis not present

## 2022-12-29 DIAGNOSIS — R918 Other nonspecific abnormal finding of lung field: Secondary | ICD-10-CM | POA: Diagnosis not present

## 2022-12-29 DIAGNOSIS — R059 Cough, unspecified: Secondary | ICD-10-CM | POA: Diagnosis not present

## 2022-12-29 DIAGNOSIS — Z79899 Other long term (current) drug therapy: Secondary | ICD-10-CM | POA: Diagnosis not present

## 2022-12-29 DIAGNOSIS — D72829 Elevated white blood cell count, unspecified: Secondary | ICD-10-CM | POA: Diagnosis not present

## 2022-12-29 DIAGNOSIS — Z982 Presence of cerebrospinal fluid drainage device: Secondary | ICD-10-CM | POA: Diagnosis not present

## 2022-12-29 DIAGNOSIS — J9601 Acute respiratory failure with hypoxia: Secondary | ICD-10-CM | POA: Diagnosis not present

## 2022-12-29 DIAGNOSIS — S065X0A Traumatic subdural hemorrhage without loss of consciousness, initial encounter: Secondary | ICD-10-CM | POA: Diagnosis not present

## 2022-12-29 DIAGNOSIS — N183 Chronic kidney disease, stage 3 unspecified: Secondary | ICD-10-CM | POA: Diagnosis not present

## 2022-12-29 DIAGNOSIS — R0602 Shortness of breath: Secondary | ICD-10-CM | POA: Diagnosis not present

## 2022-12-29 DIAGNOSIS — I6381 Other cerebral infarction due to occlusion or stenosis of small artery: Secondary | ICD-10-CM | POA: Diagnosis not present

## 2022-12-29 DIAGNOSIS — S0990XA Unspecified injury of head, initial encounter: Secondary | ICD-10-CM | POA: Diagnosis present

## 2022-12-29 DIAGNOSIS — Z09 Encounter for follow-up examination after completed treatment for conditions other than malignant neoplasm: Secondary | ICD-10-CM | POA: Diagnosis not present

## 2022-12-29 DIAGNOSIS — Z85118 Personal history of other malignant neoplasm of bronchus and lung: Secondary | ICD-10-CM | POA: Diagnosis not present

## 2022-12-29 DIAGNOSIS — G912 (Idiopathic) normal pressure hydrocephalus: Secondary | ICD-10-CM | POA: Diagnosis not present

## 2022-12-29 DIAGNOSIS — Z20822 Contact with and (suspected) exposure to covid-19: Secondary | ICD-10-CM | POA: Diagnosis not present

## 2022-12-29 DIAGNOSIS — G4089 Other seizures: Secondary | ICD-10-CM | POA: Diagnosis not present

## 2022-12-29 DIAGNOSIS — N390 Urinary tract infection, site not specified: Secondary | ICD-10-CM | POA: Diagnosis not present

## 2022-12-29 DIAGNOSIS — J449 Chronic obstructive pulmonary disease, unspecified: Secondary | ICD-10-CM | POA: Diagnosis not present

## 2022-12-29 DIAGNOSIS — S0003XA Contusion of scalp, initial encounter: Secondary | ICD-10-CM | POA: Diagnosis not present

## 2022-12-29 NOTE — ED Notes (Signed)
Belongings sent with pt including dentures.

## 2022-12-29 NOTE — ED Provider Notes (Signed)
Emergency Medicine Observation Re-evaluation Note  Amanda Porter is a 77 y.o. adult, seen on rounds today.  Pt initially presented to the ED for complaints of Weakness Currently, the patient is resting comfortably.  Physical Exam  BP (!) 156/82 (BP Location: Left Arm)   Pulse 93   Temp 97.7 F (36.5 C) (Oral)   Resp 18   Ht 1.575 m (5\' 2" )   Wt 45.4 kg   SpO2 100%   BMI 18.31 kg/m  Physical Exam  ED Course / MDM  EKG:EKG Interpretation  Date/Time:  Friday December 26 2022 15:38:06 EST Ventricular Rate:  89 PR Interval:  135 QRS Duration: 110 QT Interval:  373 QTC Calculation: 454 R Axis:   25 Text Interpretation: Sinus rhythm Anteroseptal infarct, age indeterminate No significant change since last tracing Confirmed by Georgina Snell [phone]) on 12/26/2022 6:01:33 PM  I have reviewed the labs performed to date as well as medications administered while in observation.  Recent changes in the last 24 hours include none.  Plan  Current plan is for placement.    Lacretia Leigh, MD 12/29/22 [phone]

## 2022-12-29 NOTE — Progress Notes (Addendum)
Auth approved. Pt can d/c to [facility] at 12 or later. EDP and RN notified via secure chat. Daughter notified and is requesting that RN reaches out to verify medications prior to d/c. RN notified via secure chat. [facility] to transport.   Addend @ 12:06 PM AVS faxed to [facility].

## 2022-12-29 NOTE — ED Notes (Signed)
Spoke with daughter, reviews dc meds and gave her ETA of dc.

## 2022-12-30 DIAGNOSIS — J9611 Chronic respiratory failure with hypoxia: Secondary | ICD-10-CM | POA: Diagnosis not present

## 2022-12-30 DIAGNOSIS — J441 Chronic obstructive pulmonary disease with (acute) exacerbation: Secondary | ICD-10-CM | POA: Diagnosis not present

## 2022-12-30 DIAGNOSIS — J440 Chronic obstructive pulmonary disease with acute lower respiratory infection: Secondary | ICD-10-CM | POA: Diagnosis not present

## 2022-12-30 DIAGNOSIS — J449 Chronic obstructive pulmonary disease, unspecified: Secondary | ICD-10-CM | POA: Diagnosis not present

## 2022-12-30 DIAGNOSIS — G4089 Other seizures: Secondary | ICD-10-CM | POA: Diagnosis not present

## 2022-12-30 DIAGNOSIS — M6281 Muscle weakness (generalized): Secondary | ICD-10-CM | POA: Diagnosis not present

## 2022-12-30 DIAGNOSIS — D638 Anemia in other chronic diseases classified elsewhere: Secondary | ICD-10-CM | POA: Diagnosis not present

## 2022-12-30 DIAGNOSIS — R296 Repeated falls: Secondary | ICD-10-CM | POA: Diagnosis not present

## 2022-12-30 DIAGNOSIS — J9621 Acute and chronic respiratory failure with hypoxia: Secondary | ICD-10-CM | POA: Diagnosis not present

## 2022-12-30 DIAGNOSIS — I10 Essential (primary) hypertension: Secondary | ICD-10-CM | POA: Diagnosis not present

## 2022-12-30 DIAGNOSIS — F0390 Unspecified dementia without behavioral disturbance: Secondary | ICD-10-CM | POA: Diagnosis not present

## 2022-12-30 DIAGNOSIS — G912 (Idiopathic) normal pressure hydrocephalus: Secondary | ICD-10-CM | POA: Diagnosis not present

## 2022-12-31 ENCOUNTER — Inpatient Hospital Stay: Payer: Medicare PPO | Admitting: Primary Care

## 2022-12-31 ENCOUNTER — Telehealth: Payer: Self-pay

## 2022-12-31 DIAGNOSIS — R296 Repeated falls: Secondary | ICD-10-CM | POA: Diagnosis not present

## 2022-12-31 DIAGNOSIS — D638 Anemia in other chronic diseases classified elsewhere: Secondary | ICD-10-CM | POA: Diagnosis not present

## 2022-12-31 DIAGNOSIS — F445 Conversion disorder with seizures or convulsions: Secondary | ICD-10-CM | POA: Diagnosis not present

## 2022-12-31 DIAGNOSIS — J440 Chronic obstructive pulmonary disease with acute lower respiratory infection: Secondary | ICD-10-CM | POA: Diagnosis not present

## 2022-12-31 DIAGNOSIS — I10 Essential (primary) hypertension: Secondary | ICD-10-CM | POA: Diagnosis not present

## 2022-12-31 DIAGNOSIS — M6281 Muscle weakness (generalized): Secondary | ICD-10-CM | POA: Diagnosis not present

## 2022-12-31 DIAGNOSIS — G912 (Idiopathic) normal pressure hydrocephalus: Secondary | ICD-10-CM | POA: Diagnosis not present

## 2022-12-31 DIAGNOSIS — J9621 Acute and chronic respiratory failure with hypoxia: Secondary | ICD-10-CM | POA: Diagnosis not present

## 2022-12-31 NOTE — Telephone Encounter (Signed)
        Patient  visited [facility] on 1/29    Telephone encounter attempt :   1st   A HIPAA compliant voice message was left requesting a return call.  Instructed patient to call back     [facility] [phone] [address], [place],  [address] Phone: [phone] Email: [email or web address]

## 2023-01-02 DIAGNOSIS — D638 Anemia in other chronic diseases classified elsewhere: Secondary | ICD-10-CM | POA: Diagnosis not present

## 2023-01-02 DIAGNOSIS — R296 Repeated falls: Secondary | ICD-10-CM | POA: Diagnosis not present

## 2023-01-02 DIAGNOSIS — I10 Essential (primary) hypertension: Secondary | ICD-10-CM | POA: Diagnosis not present

## 2023-01-02 DIAGNOSIS — M6281 Muscle weakness (generalized): Secondary | ICD-10-CM | POA: Diagnosis not present

## 2023-01-02 DIAGNOSIS — G912 (Idiopathic) normal pressure hydrocephalus: Secondary | ICD-10-CM | POA: Diagnosis not present

## 2023-01-02 DIAGNOSIS — J440 Chronic obstructive pulmonary disease with acute lower respiratory infection: Secondary | ICD-10-CM | POA: Diagnosis not present

## 2023-01-02 DIAGNOSIS — J9621 Acute and chronic respiratory failure with hypoxia: Secondary | ICD-10-CM | POA: Diagnosis not present

## 2023-01-02 DIAGNOSIS — J441 Chronic obstructive pulmonary disease with (acute) exacerbation: Secondary | ICD-10-CM | POA: Diagnosis not present

## 2023-01-02 DIAGNOSIS — J9611 Chronic respiratory failure with hypoxia: Secondary | ICD-10-CM | POA: Diagnosis not present

## 2023-01-02 DIAGNOSIS — F445 Conversion disorder with seizures or convulsions: Secondary | ICD-10-CM | POA: Diagnosis not present

## 2023-01-02 DIAGNOSIS — F0390 Unspecified dementia without behavioral disturbance: Secondary | ICD-10-CM | POA: Diagnosis not present

## 2023-01-05 DIAGNOSIS — F411 Generalized anxiety disorder: Secondary | ICD-10-CM | POA: Diagnosis not present

## 2023-01-05 DIAGNOSIS — J9621 Acute and chronic respiratory failure with hypoxia: Secondary | ICD-10-CM | POA: Diagnosis not present

## 2023-01-05 DIAGNOSIS — I10 Essential (primary) hypertension: Secondary | ICD-10-CM | POA: Diagnosis not present

## 2023-01-05 DIAGNOSIS — D638 Anemia in other chronic diseases classified elsewhere: Secondary | ICD-10-CM | POA: Diagnosis not present

## 2023-01-05 DIAGNOSIS — F445 Conversion disorder with seizures or convulsions: Secondary | ICD-10-CM | POA: Diagnosis not present

## 2023-01-05 DIAGNOSIS — F331 Major depressive disorder, recurrent, moderate: Secondary | ICD-10-CM | POA: Diagnosis not present

## 2023-01-05 DIAGNOSIS — J440 Chronic obstructive pulmonary disease with acute lower respiratory infection: Secondary | ICD-10-CM | POA: Diagnosis not present

## 2023-01-05 DIAGNOSIS — F02B3 Dementia in other diseases classified elsewhere, moderate, with mood disturbance: Secondary | ICD-10-CM | POA: Diagnosis not present

## 2023-01-05 DIAGNOSIS — M6281 Muscle weakness (generalized): Secondary | ICD-10-CM | POA: Diagnosis not present

## 2023-01-05 DIAGNOSIS — R296 Repeated falls: Secondary | ICD-10-CM | POA: Diagnosis not present

## 2023-01-05 DIAGNOSIS — G912 (Idiopathic) normal pressure hydrocephalus: Secondary | ICD-10-CM | POA: Diagnosis not present

## 2023-01-06 ENCOUNTER — Telehealth: Payer: Self-pay

## 2023-01-06 DIAGNOSIS — J9611 Chronic respiratory failure with hypoxia: Secondary | ICD-10-CM | POA: Diagnosis not present

## 2023-01-06 DIAGNOSIS — F0390 Unspecified dementia without behavioral disturbance: Secondary | ICD-10-CM | POA: Diagnosis not present

## 2023-01-06 DIAGNOSIS — M6281 Muscle weakness (generalized): Secondary | ICD-10-CM | POA: Diagnosis not present

## 2023-01-06 DIAGNOSIS — J441 Chronic obstructive pulmonary disease with (acute) exacerbation: Secondary | ICD-10-CM | POA: Diagnosis not present

## 2023-01-06 NOTE — Telephone Encounter (Signed)
        Patient  visited [facility] on 1/29     Telephone encounter attempt :  2nd  A HIPAA compliant voice message was left requesting a return call.  Instructed patient to call back.   [facility] [phone] [address], [place], [place] [address] Phone: [phone] Email: [email or web address]

## 2023-01-07 ENCOUNTER — Encounter (HOSPITAL_COMMUNITY): Payer: Self-pay | Admitting: Emergency Medicine

## 2023-01-07 ENCOUNTER — Emergency Department (HOSPITAL_COMMUNITY): Payer: Medicare PPO

## 2023-01-07 ENCOUNTER — Emergency Department (HOSPITAL_COMMUNITY)
Admission: EM | Admit: 2023-01-07 | Discharge: 2023-01-07 | Disposition: A | Discharge status: Home / Self Care | Payer: Medicare PPO | Attending: Emergency Medicine | Admitting: Emergency Medicine

## 2023-01-07 DIAGNOSIS — W01198A Fall on same level from slipping, tripping and stumbling with subsequent striking against other object, initial encounter: Secondary | ICD-10-CM | POA: Insufficient documentation

## 2023-01-07 DIAGNOSIS — Z043 Encounter for examination and observation following other accident: Secondary | ICD-10-CM | POA: Diagnosis not present

## 2023-01-07 DIAGNOSIS — Z8673 Personal history of transient ischemic attack (TIA), and cerebral infarction without residual deficits: Secondary | ICD-10-CM | POA: Insufficient documentation

## 2023-01-07 DIAGNOSIS — Z1152 Encounter for screening for COVID-19: Secondary | ICD-10-CM | POA: Insufficient documentation

## 2023-01-07 DIAGNOSIS — J449 Chronic obstructive pulmonary disease, unspecified: Secondary | ICD-10-CM | POA: Insufficient documentation

## 2023-01-07 DIAGNOSIS — R0602 Shortness of breath: Secondary | ICD-10-CM | POA: Insufficient documentation

## 2023-01-07 DIAGNOSIS — R918 Other nonspecific abnormal finding of lung field: Secondary | ICD-10-CM | POA: Diagnosis not present

## 2023-01-07 DIAGNOSIS — J9811 Atelectasis: Secondary | ICD-10-CM | POA: Diagnosis not present

## 2023-01-07 DIAGNOSIS — R296 Repeated falls: Secondary | ICD-10-CM | POA: Diagnosis not present

## 2023-01-07 DIAGNOSIS — J939 Pneumothorax, unspecified: Secondary | ICD-10-CM | POA: Diagnosis not present

## 2023-01-07 DIAGNOSIS — E119 Type 2 diabetes mellitus without complications: Secondary | ICD-10-CM | POA: Diagnosis not present

## 2023-01-07 DIAGNOSIS — M47812 Spondylosis without myelopathy or radiculopathy, cervical region: Secondary | ICD-10-CM | POA: Diagnosis not present

## 2023-01-07 DIAGNOSIS — Z7982 Long term (current) use of aspirin: Secondary | ICD-10-CM | POA: Diagnosis not present

## 2023-01-07 DIAGNOSIS — Z85118 Personal history of other malignant neoplasm of bronchus and lung: Secondary | ICD-10-CM | POA: Insufficient documentation

## 2023-01-07 DIAGNOSIS — J439 Emphysema, unspecified: Secondary | ICD-10-CM | POA: Diagnosis not present

## 2023-01-07 DIAGNOSIS — W19XXXA Unspecified fall, initial encounter: Secondary | ICD-10-CM

## 2023-01-07 DIAGNOSIS — S0990XA Unspecified injury of head, initial encounter: Secondary | ICD-10-CM | POA: Diagnosis not present

## 2023-01-07 DIAGNOSIS — S0003XA Contusion of scalp, initial encounter: Secondary | ICD-10-CM | POA: Diagnosis not present

## 2023-01-07 LAB — CBC
HCT: 30 % — ABNORMAL LOW (ref 36.0–46.0)
Hemoglobin: 9.1 g/dL — ABNORMAL LOW (ref 12.0–15.0)
MCH: 27 pg (ref 26.0–34.0)
MCHC: 30.3 g/dL (ref 30.0–36.0)
MCV: 89 fL (ref 80.0–100.0)
Platelets: 349 10*3/uL (ref 150–400)
RBC: 3.37 MIL/uL — ABNORMAL LOW (ref 3.87–5.11)
RDW: 16.3 % — ABNORMAL HIGH (ref 11.5–15.5)
WBC: 9.5 10*3/uL (ref 4.0–10.5)
nRBC: 0 % (ref 0.0–0.2)

## 2023-01-07 LAB — BASIC METABOLIC PANEL
Anion gap: 11 (ref 5–15)
BUN: 13 mg/dL (ref 8–23)
CO2: 28 mmol/L (ref 22–32)
Calcium: 9 mg/dL (ref 8.9–10.3)
Chloride: 101 mmol/L (ref 98–111)
Creatinine, Ser: 0.63 mg/dL (ref 0.44–1.00)
GFR, Estimated: >60 mL/min (ref >60)
Glucose, Bld: 137 mg/dL — ABNORMAL HIGH (ref 70–99)
Potassium: 4.2 mmol/L (ref 3.5–5.1)
Sodium: 140 mmol/L (ref 135–145)

## 2023-01-07 LAB — RESP PANEL BY RT-PCR (RSV, FLU A&B, COVID)  RVPGX2
Influenza A by PCR: NEGATIVE
Influenza B by PCR: NEGATIVE
Resp Syncytial Virus by PCR: NEGATIVE
SARS Coronavirus 2 by RT PCR: NEGATIVE

## 2023-01-07 NOTE — ED Notes (Signed)
Pt taken to bathroom with wheelchair.  Pt can stand with one assist.  Pt back in bed , stretcher in lowest position with 3L oxygen nasal cannula.  No new orders at this time.

## 2023-01-07 NOTE — ED Notes (Signed)
Patient transported to CT 

## 2023-01-07 NOTE — ED Triage Notes (Signed)
Pt arrives via EMS from [facility] with reports of slip and fall at 850 today. Pt had recent surgery to head, no blood thinners but hit side of head on railing. Pt wears 3L O2 at baseline.

## 2023-01-07 NOTE — ED Provider Triage Note (Signed)
Emergency Medicine Provider Triage Evaluation Note  Amanda Porter , a 77 y.o. adult  was evaluated in triage.  Pt complains of fall, with no pain.  Review of Systems   Negative: Headache, neck pain  Physical Exam  BP 132/70   Pulse 96   Temp 98.3 F (36.8 C) (Oral)   Resp 18   Ht 5\' 2"  (1.575 m)   Wt 45 kg   SpO2 99%   BMI 18.15 kg/m  Gen:   Awake, no distress   Resp:  Normal effort  MSK:   Moves extremities without difficulty  Other:  No concerning findings  Medical Decision Making  Medically screening exam initiated at 10:35 AM.  Appropriate orders placed.  Amanda Porter was informed that the remainder of the evaluation will be completed by another provider, this initial triage assessment does not replace that evaluation, and the importance of remaining in the ED until their evaluation is complete.  CT scan of the head, C-spine ordered.  Patient is confused.   Varney Biles, MD 01/07/23 1036

## 2023-01-07 NOTE — Discharge Instructions (Signed)
Your head and neck CT did not show any signs of severe injuries.  Your x-ray was suspicious for a pneumothorax or slightly collapsed lung, however your lungs appeared normal on CT and did not show any signs of rib fracture.  Please follow-up with your doctor when able for further evaluation of your symptoms.

## 2023-01-07 NOTE — ED Notes (Signed)
Patient transported to X-ray 

## 2023-01-07 NOTE — ED Provider Notes (Signed)
[facility] Provider Note   CSN: [id number] Arrival date & time: 01/07/23  1016     History  Chief Complaint  Patient presents with   Fall    Amanda Porter is a 77 y.o. adult with medical history of normal pressure hydrocephalus, anemia, anxiety, COPD chronically on 2 L, CVA, dementia, diabetes, dysphagia, frequent falls, GERD, history of lung cancer.  Patient presents to ED for evaluation of fall.  Patient reports that she had ground-level fall this morning while getting out of her bed, she slipped and hit the back of her head on the ground.  Patient denies blood thinners, loss of consciousness, headache, neck pain, back pain, nausea or vomiting.  Patient reports she was on ground for maybe 1 minute before being helped to her feet.  Patient has no complaints at this time.  Patient daughter at bedside states that the patient was more short of breath than usual yesterday during physical therapy, she is requesting chest x-ray as well as viral panel.   Fall Associated symptoms include shortness of breath. Pertinent negatives include no chest pain and no headaches.       Home Medications Prior to Admission medications   Medication Sig Start Date End Date Taking? Authorizing Provider  albuterol (VENTOLIN HFA) 108 (90 Base) MCG/ACT inhaler INHALE 2 PUFFS BY MOUTH EVERY 4 HOURS AS NEEDED Patient taking differently: Inhale 2 puffs into the lungs every 4 (four) hours as needed for wheezing or shortness of breath. 07/11/22   Baird Lyons D, MD  Albuterol Sulfate 2.5 MG/0.5ML NEBU Inhale 2.5 mg into the lungs every 6 (six) hours as needed (shortness of breath).    [provider]  amLODipine (NORVASC) 2.5 MG tablet Take 1 tablet (2.5 mg total) by mouth daily. for blood pressure. 08/19/22   Pleas Koch, NP  ascorbic acid (VITAMIN C) 500 MG tablet Take 1 tablet (500 mg total) by mouth daily. 12/19/22 03/19/23  Val Riles, MD  aspirin 81  MG chewable tablet Chew 1 tablet (81 mg total) by mouth daily. Hold for now and resume on 12/22/22 12/22/22   Val Riles, MD  Budeson-Glycopyrrol-Formoterol (BREZTRI AEROSPHERE) 160-9-4.8 MCG/ACT AERO Inhale 2 puffs into the lungs in the morning and at bedtime. 12/16/22   Baird Lyons D, MD  buPROPion (WELLBUTRIN XL) 300 MG 24 hr tablet Take 1 tablet (300 mg total) by mouth daily with breakfast. for anxiety and depression. 11/10/22   Pleas Koch, NP  busPIRone (BUSPAR) 15 MG tablet Take 30 mg by mouth 2 (two) times daily.    [provider]  Calcium Carb-Cholecalciferol (CALCIUM/VITAMIN D PO) Take 1 tablet by mouth daily.    [provider]  clonazePAM (KLONOPIN) 1 MG tablet Take 1 mg by mouth at bedtime.    [provider]  conjugated estrogens (PREMARIN) vaginal cream Apply one pea-sized amount around the opening of the urethra daily for 2 weeks, then 3 times weekly moving forward. 08/20/22   Vaillancourt, Aldona Bar, PA-C  Continuous Blood Gluc Sensor (FREESTYLE LIBRE 3 SENSOR) MISC Place 1 sensor on the skin every 14 days. Use to check glucose continuously 12/19/22   Pleas Koch, NP  ferrous sulfate 325 (65 FE) MG tablet Take 1 tablet (325 mg total) by mouth daily with breakfast. 12/19/22 03/19/23  Val Riles, MD  glipiZIDE (GLUCOTROL XL) 2.5 MG 24 hr tablet TAKE 1 TABLET (2.5 MG TOTAL) BY MOUTH DAILY WITH BREAKFAST. FOR DIABETES. 08/04/22  Pleas Koch, NP  Melatonin 10 MG TABS Take 10 mg by mouth at bedtime.    [provider]  memantine (NAMENDA) 5 MG tablet Take 1 tablet (5 mg total) by mouth 2 (two) times daily. For memory. 07/14/22   Pleas Koch, NP  pantoprazole (PROTONIX) 40 MG tablet Take 1 tablet (40 mg total) by mouth daily. 12/19/22 03/19/23  Val Riles, MD  rosuvastatin (CRESTOR) 20 MG tablet TAKE 1 TABLET (20 MG TOTAL) BY MOUTH EVERY EVENING. FOR CHOLESTEROL. 07/13/22   Pleas Koch, NP  Tiotropium Bromide-Olodaterol  (STIOLTO RESPIMAT) 2.5-2.5 MCG/ACT AERS Inhale 2 each into the lungs in the morning.    [provider]      Allergies    Lexapro [escitalopram oxalate], Neosporin  [neomycin-polymyxin-gramicidin], and Neosporin [bacitracin-polymyxin b]    Review of Systems   Review of Systems  Respiratory:  Positive for shortness of breath.   Cardiovascular:  Negative for chest pain.  Gastrointestinal:  Negative for nausea and vomiting.  Musculoskeletal:  Negative for back pain and neck pain.  Neurological:  Negative for syncope, weakness, numbness and headaches.  All other systems reviewed and are negative.   Physical Exam Updated Vital Signs BP 136/74 (BP Location: Right Arm)   Pulse 89   Temp 98.3 F (36.8 C) (Oral)   Resp 16   Ht 5\' 2"  (1.575 m)   Wt 45 kg   SpO2 99%   BMI 18.15 kg/m  Physical Exam Vitals and nursing note reviewed.  Constitutional:      General: Amanda A Hinchcliff "[name]" is not in acute distress.    Appearance: Amanda A Howson "[name]" is not ill-appearing, toxic-appearing or diaphoretic.  HENT:     Head: Normocephalic and atraumatic.     Mouth/Throat:     Mouth: Mucous membranes are moist.     Pharynx: Oropharynx is clear.     Comments: Hematoma to right posterior parietal scalp, no hemorrhaging, no laceration Eyes:     Extraocular Movements: Extraocular movements intact.     Conjunctiva/sclera: Conjunctivae normal.     Pupils: Pupils are equal, round, and reactive to light.  Cardiovascular:     Rate and Rhythm: Normal rate and regular rhythm.  Pulmonary:     Effort: Pulmonary effort is normal.     Breath sounds: Normal breath sounds. No wheezing.  Abdominal:     General: Abdomen is flat.     Tenderness: There is no abdominal tenderness.  Skin:    General: Skin is warm and dry.     Capillary Refill: Capillary refill takes less than 2 seconds.  Neurological:     Mental Status: Amanda A Gasbarro "[name]" is alert and oriented to person, place, and time.     ED  Results / Procedures / Treatments   Labs (all labs ordered are listed, but only abnormal results are displayed) Labs Reviewed  CBC - Abnormal; Notable for the following components:      Result Value   RBC 3.37 (*)    Hemoglobin 9.1 (*)    HCT 30.0 (*)    RDW 16.3 (*)    All other components within normal limits  BASIC METABOLIC PANEL - Abnormal; Notable for the following components:   Glucose, Bld 137 (*)    All other components within normal limits  RESP PANEL BY RT-PCR (RSV, FLU A&B, COVID)  RVPGX2    EKG None  Radiology DG Chest 1 View  Result Date: 01/07/2023 CLINICAL DATA:  Shortness of  breath EXAM: CHEST  1 VIEW COMPARISON:  12/26/2022 FINDINGS: Stable cardiomediastinal contours. Postsurgical changes in the left suprahilar region. Aortic atherosclerosis. Partially imaged VP shunt catheter tubing. There is a tiny left apical pneumothorax. No abnormal shift of the heart or mediastinal structures. Minimal bibasilar atelectasis. Emphysematous lung changes. No pleural effusion. No acute bony abnormalities seen. IMPRESSION: Tiny left apical pneumothorax. These results were called by telephone at the time of interpretation on 01/07/2023 at 1:42 pm to provider [facility] , who verbally acknowledged these results. Electronically Signed   By: Davina Poke D.O.   On: 01/07/2023 13:45   CT Head Wo Contrast  Result Date: 01/07/2023 CLINICAL DATA:  Head trauma.  Fell. EXAM: CT HEAD WITHOUT CONTRAST CT CERVICAL SPINE WITHOUT CONTRAST TECHNIQUE: Multidetector CT imaging of the head and cervical spine was performed following the standard protocol without intravenous contrast. Multiplanar CT image reconstructions of the cervical spine were also generated. RADIATION DOSE REDUCTION: This exam was performed according to the departmental dose-optimization program which includes automated exposure control, adjustment of the mA and/or kV according to patient size and/or use of iterative  reconstruction technique. COMPARISON:  Head CT 05/27/2023.  Cervical spine CT 12/16/2022. FINDINGS: CT HEAD FINDINGS Brain: The ventriculostomy catheter is stable. Stable ventriculomegaly and advanced periventricular white matter disease. No acute intracranial process is identified. No extra-axial fluid collections. Remote lacunar type infarct noted in the right thalamus. Vascular: Stable vascular calcifications.  No hyperdense vessels. Skull: No acute skull fracture. Sinuses/Orbits: The paranasal sinuses and mastoid air cells are clear. The globes are intact. Other: Small right occipital scalp hematoma. No underlying fracture. CT CERVICAL SPINE FINDINGS Alignment: Normal Skull base and vertebrae: No acute fracture. No primary bone lesion or focal pathologic process. Stable advanced degenerative changes at C1-2 with calcified pannus but no significant mass effect on the upper cervical cord. Soft tissues and spinal canal: No prevertebral fluid or swelling. No visible canal hematoma. Disc levels: The spinal canal is generous. No significant canal stenosis. Upper chest: The lung apices are grossly clear. Emphysematous changes are noted. Other: No neck mass, adenopathy or hematoma. Bilateral carotid artery calcifications are noted. IMPRESSION: 1. Small right occipital scalp hematoma without underlying fracture. 2. Stable ventriculostomy catheter and ventriculomegaly. 3. No acute intracranial findings. 4. Normal alignment of the cervical spine without acute fracture. Electronically Signed   By: Marijo Sanes M.D.   On: 01/07/2023 11:30   CT Cervical Spine Wo Contrast  Result Date: 01/07/2023 CLINICAL DATA:  Head trauma.  Fell. EXAM: CT HEAD WITHOUT CONTRAST CT CERVICAL SPINE WITHOUT CONTRAST TECHNIQUE: Multidetector CT imaging of the head and cervical spine was performed following the standard protocol without intravenous contrast. Multiplanar CT image reconstructions of the cervical spine were also generated.  RADIATION DOSE REDUCTION: This exam was performed according to the departmental dose-optimization program which includes automated exposure control, adjustment of the mA and/or kV according to patient size and/or use of iterative reconstruction technique. COMPARISON:  Head CT 05/27/2023.  Cervical spine CT 12/16/2022. FINDINGS: CT HEAD FINDINGS Brain: The ventriculostomy catheter is stable. Stable ventriculomegaly and advanced periventricular white matter disease. No acute intracranial process is identified. No extra-axial fluid collections. Remote lacunar type infarct noted in the right thalamus. Vascular: Stable vascular calcifications.  No hyperdense vessels. Skull: No acute skull fracture. Sinuses/Orbits: The paranasal sinuses and mastoid air cells are clear. The globes are intact. Other: Small right occipital scalp hematoma. No underlying fracture. CT CERVICAL SPINE FINDINGS Alignment: Normal Skull base and vertebrae: No  acute fracture. No primary bone lesion or focal pathologic process. Stable advanced degenerative changes at C1-2 with calcified pannus but no significant mass effect on the upper cervical cord. Soft tissues and spinal canal: No prevertebral fluid or swelling. No visible canal hematoma. Disc levels: The spinal canal is generous. No significant canal stenosis. Upper chest: The lung apices are grossly clear. Emphysematous changes are noted. Other: No neck mass, adenopathy or hematoma. Bilateral carotid artery calcifications are noted. IMPRESSION: 1. Small right occipital scalp hematoma without underlying fracture. 2. Stable ventriculostomy catheter and ventriculomegaly. 3. No acute intracranial findings. 4. Normal alignment of the cervical spine without acute fracture. Electronically Signed   By: Marijo Sanes M.D.   On: 01/07/2023 11:30    Procedures Procedures    Medications Ordered in ED Medications - No data to display  ED Course/ Medical Decision Making/ A&P  Medical Decision  Making Amount and/or Complexity of Data Reviewed Labs: ordered. Radiology: ordered.   77 year old female presents to the ED for evaluation.  Please see HPI for further details.  On my examination the patient is afebrile, nontachycardic.  Lung sounds are clear bilaterally, she is not hypoxic.  Abdomen soft and compressible.  Patient does have moderate sized hematoma to right posterior scalp, nonbleeding, no laceration.  Patient alert and oriented to time and place.  At mental status baseline.  Workup will include CT head, CT cervical spine.  Will also add on plain film imaging of chest as well as viral panel.  CT head CT cervical spine showed moderate sized hematoma however no underlying skull fracture.  CT cervical spine shows no traumatic listhesis of C-spine.  Viral panel negative for all.  Patient plain film imaging of chest shows left-sided pneumothorax without midline shift.  Will proceed with CT chest to rule out any occult fracture.  Patient signed out to oncoming team pending imaging studies.  Patient amenable to plan.  Patient POA, [name] (her daughter), has been made aware of findings.   Final Clinical Impression(s) / ED Diagnoses Final diagnoses:  Fall, initial encounter  Pneumothorax, unspecified type    Rx / DC Orders ED Discharge Orders     None         Lawana Chambers 01/07/23 1535    Godfrey Pick, MD 01/07/23 1622

## 2023-01-07 NOTE — ED Provider Notes (Signed)
Signout from [employer] at shift change. Briefly, patient presents for fall.   Plan: There is question of small pneumothorax on x-ray.  This was not shown on CT of C-spine, but due to fall wanted to get chest CT to evaluate more fully and ensure no rib fractures.   4:49 PM Reassessment performed. Patient appears stable, comfortable.  Labs and imaging personally reviewed and interpreted including: CT of the chest, agree no pneumothorax.   Most current vital signs reviewed and are as follows: BP 136/74 (BP Location: Right Arm)   Pulse 89   Temp 98.3 F (36.8 C) (Oral)   Resp 16   Ht 5\' 2"  (1.575 m)   Wt 45 kg   SpO2 99%   BMI 18.15 kg/m   Plan: Discharge to home once patient has a safe ride.   Home treatment: Conservative measures, OTC meds, ice/heat.   Return and follow-up instructions: Encouraged return to ED with worsening symptoms or other concerns. Encouraged patient to follow-up with their provider in 3 days as needed for any residual symptoms. Patient verbalized understanding and agreed with plan.        Carlisle Cater, PA-C 01/07/23 [place], [place], DO 01/09/23 [phone]

## 2023-01-07 NOTE — ED Notes (Signed)
Pt currently on 3L of oxygen via nasal cannula per baseline.  SpO2 at 100%.  No new orders at this time.

## 2023-01-08 DIAGNOSIS — R296 Repeated falls: Secondary | ICD-10-CM | POA: Diagnosis not present

## 2023-01-09 DIAGNOSIS — R296 Repeated falls: Secondary | ICD-10-CM | POA: Diagnosis not present

## 2023-01-09 DIAGNOSIS — F445 Conversion disorder with seizures or convulsions: Secondary | ICD-10-CM | POA: Diagnosis not present

## 2023-01-09 DIAGNOSIS — I10 Essential (primary) hypertension: Secondary | ICD-10-CM | POA: Diagnosis not present

## 2023-01-09 DIAGNOSIS — M6281 Muscle weakness (generalized): Secondary | ICD-10-CM | POA: Diagnosis not present

## 2023-01-09 DIAGNOSIS — J440 Chronic obstructive pulmonary disease with acute lower respiratory infection: Secondary | ICD-10-CM | POA: Diagnosis not present

## 2023-01-09 DIAGNOSIS — J9611 Chronic respiratory failure with hypoxia: Secondary | ICD-10-CM | POA: Diagnosis not present

## 2023-01-09 DIAGNOSIS — D638 Anemia in other chronic diseases classified elsewhere: Secondary | ICD-10-CM | POA: Diagnosis not present

## 2023-01-09 DIAGNOSIS — G912 (Idiopathic) normal pressure hydrocephalus: Secondary | ICD-10-CM | POA: Diagnosis not present

## 2023-01-09 DIAGNOSIS — J441 Chronic obstructive pulmonary disease with (acute) exacerbation: Secondary | ICD-10-CM | POA: Diagnosis not present

## 2023-01-09 DIAGNOSIS — F0390 Unspecified dementia without behavioral disturbance: Secondary | ICD-10-CM | POA: Diagnosis not present

## 2023-01-09 DIAGNOSIS — J9621 Acute and chronic respiratory failure with hypoxia: Secondary | ICD-10-CM | POA: Diagnosis not present

## 2023-01-11 ENCOUNTER — Emergency Department (HOSPITAL_COMMUNITY): Payer: Medicare PPO

## 2023-01-11 ENCOUNTER — Other Ambulatory Visit: Payer: Self-pay

## 2023-01-11 ENCOUNTER — Emergency Department (HOSPITAL_COMMUNITY)
Admission: EM | Admit: 2023-01-11 | Discharge: 2023-01-12 | Disposition: A | Discharge status: Home / Self Care | Payer: Medicare PPO | Attending: Emergency Medicine | Admitting: Emergency Medicine

## 2023-01-11 DIAGNOSIS — R911 Solitary pulmonary nodule: Secondary | ICD-10-CM | POA: Diagnosis not present

## 2023-01-11 DIAGNOSIS — I6381 Other cerebral infarction due to occlusion or stenosis of small artery: Secondary | ICD-10-CM | POA: Diagnosis not present

## 2023-01-11 DIAGNOSIS — W19XXXA Unspecified fall, initial encounter: Secondary | ICD-10-CM | POA: Insufficient documentation

## 2023-01-11 DIAGNOSIS — J9811 Atelectasis: Secondary | ICD-10-CM | POA: Diagnosis not present

## 2023-01-11 DIAGNOSIS — I6200 Nontraumatic subdural hemorrhage, unspecified: Secondary | ICD-10-CM

## 2023-01-11 DIAGNOSIS — S0003XA Contusion of scalp, initial encounter: Secondary | ICD-10-CM | POA: Diagnosis not present

## 2023-01-11 DIAGNOSIS — Z79899 Other long term (current) drug therapy: Secondary | ICD-10-CM | POA: Insufficient documentation

## 2023-01-11 DIAGNOSIS — I10 Essential (primary) hypertension: Secondary | ICD-10-CM | POA: Diagnosis not present

## 2023-01-11 DIAGNOSIS — Y92129 Unspecified place in nursing home as the place of occurrence of the external cause: Secondary | ICD-10-CM | POA: Diagnosis not present

## 2023-01-11 DIAGNOSIS — R410 Disorientation, unspecified: Secondary | ICD-10-CM | POA: Diagnosis not present

## 2023-01-11 DIAGNOSIS — F0390 Unspecified dementia without behavioral disturbance: Secondary | ICD-10-CM | POA: Insufficient documentation

## 2023-01-11 DIAGNOSIS — S0181XA Laceration without foreign body of other part of head, initial encounter: Secondary | ICD-10-CM | POA: Insufficient documentation

## 2023-01-11 DIAGNOSIS — S065X0A Traumatic subdural hemorrhage without loss of consciousness, initial encounter: Secondary | ICD-10-CM | POA: Insufficient documentation

## 2023-01-11 DIAGNOSIS — Z7982 Long term (current) use of aspirin: Secondary | ICD-10-CM | POA: Diagnosis not present

## 2023-01-11 DIAGNOSIS — S0990XA Unspecified injury of head, initial encounter: Secondary | ICD-10-CM | POA: Diagnosis present

## 2023-01-11 DIAGNOSIS — Z043 Encounter for examination and observation following other accident: Secondary | ICD-10-CM | POA: Diagnosis not present

## 2023-01-11 DIAGNOSIS — S199XXA Unspecified injury of neck, initial encounter: Secondary | ICD-10-CM | POA: Diagnosis not present

## 2023-01-11 LAB — CBC WITH DIFFERENTIAL/PLATELET
Abs Immature Granulocytes: 0.04 10*3/uL (ref 0.00–0.07)
Basophils Absolute: 0.1 10*3/uL (ref 0.0–0.1)
Basophils Relative: 1 %
Eosinophils Absolute: 0.2 10*3/uL (ref 0.0–0.5)
Eosinophils Relative: 2 %
HCT: 26.9 % — ABNORMAL LOW (ref 36.0–46.0)
Hemoglobin: 8 g/dL — ABNORMAL LOW (ref 12.0–15.0)
Immature Granulocytes: 0 %
Lymphocytes Relative: 22 %
Lymphs Abs: 2.7 10*3/uL (ref 0.7–4.0)
MCH: 26.8 pg (ref 26.0–34.0)
MCHC: 29.7 g/dL — ABNORMAL LOW (ref 30.0–36.0)
MCV: 90 fL (ref 80.0–100.0)
Monocytes Absolute: 1.2 10*3/uL — ABNORMAL HIGH (ref 0.1–1.0)
Monocytes Relative: 10 %
Neutro Abs: 7.7 10*3/uL (ref 1.7–7.7)
Neutrophils Relative %: 65 %
Platelets: 365 10*3/uL (ref 150–400)
RBC: 2.99 MIL/uL — ABNORMAL LOW (ref 3.87–5.11)
RDW: 16.2 % — ABNORMAL HIGH (ref 11.5–15.5)
WBC: 11.8 10*3/uL — ABNORMAL HIGH (ref 4.0–10.5)
nRBC: 0 % (ref 0.0–0.2)

## 2023-01-11 LAB — COMPREHENSIVE METABOLIC PANEL
ALT: 13 U/L (ref 0–44)
AST: 14 U/L — ABNORMAL LOW (ref 15–41)
Albumin: 2.9 g/dL — ABNORMAL LOW (ref 3.5–5.0)
Alkaline Phosphatase: 51 U/L (ref 38–126)
Anion gap: 8 (ref 5–15)
BUN: 18 mg/dL (ref 8–23)
CO2: 28 mmol/L (ref 22–32)
Calcium: 9.1 mg/dL (ref 8.9–10.3)
Chloride: 103 mmol/L (ref 98–111)
Creatinine, Ser: 0.73 mg/dL (ref 0.44–1.00)
GFR, Estimated: >60 mL/min (ref >60)
Glucose, Bld: 152 mg/dL — ABNORMAL HIGH (ref 70–99)
Potassium: 3.6 mmol/L (ref 3.5–5.1)
Sodium: 139 mmol/L (ref 135–145)
Total Bilirubin: 0.4 mg/dL (ref 0.3–1.2)
Total Protein: 6.3 g/dL — ABNORMAL LOW (ref 6.5–8.1)

## 2023-01-11 MED ORDER — SODIUM CHLORIDE 0.9 % IV BOLUS
1000.0000 mL | Freq: Once | INTRAVENOUS | Status: AC
  Administered 2023-01-11: 1000 mL via INTRAVENOUS

## 2023-01-11 MED ORDER — BUSPIRONE HCL 10 MG PO TABS
15.0000 mg | ORAL_TABLET | Freq: Once | ORAL | Status: AC
  Administered 2023-01-12: 15 mg via ORAL
  Filled 2023-01-11: qty 2

## 2023-01-11 MED ORDER — CLONAZEPAM 0.5 MG PO TABS
1.0000 mg | ORAL_TABLET | Freq: Once | ORAL | Status: AC
  Administered 2023-01-12: 1 mg via ORAL
  Filled 2023-01-11: qty 2

## 2023-01-11 NOTE — ED Provider Notes (Signed)
[facility] Provider Note   CSN: [id number] Arrival date & time: 01/11/23  2020     History  Chief Complaint  Patient presents with   Fall   Altered Mental Status    Amanda Porter is a 77 y.o. adult history of dementia, hypertension here presenting with fall.  This is a recurrent problem.  She is from a nursing home and apparently had a fall 2 days ago.  She was seen in the ED and had normal CT head and cervical spine.  She had possible pneumothorax on chest x-ray and CT chest did not show a pneumothorax.  Patient apparently had another fall at the nursing home today.  Patient has repetitive questioning and was noted to be altered  The history is provided by the patient.       Home Medications Prior to Admission medications   Medication Sig Start Date End Date Taking? Authorizing Provider  albuterol (VENTOLIN HFA) 108 (90 Base) MCG/ACT inhaler INHALE 2 PUFFS BY MOUTH EVERY 4 HOURS AS NEEDED Patient taking differently: Inhale 2 puffs into the lungs every 4 (four) hours as needed for wheezing or shortness of breath. 07/11/22   Baird Lyons D, MD  Albuterol Sulfate 2.5 MG/0.5ML NEBU Inhale 2.5 mg into the lungs every 6 (six) hours as needed (shortness of breath).    [provider]  amLODipine (NORVASC) 2.5 MG tablet Take 1 tablet (2.5 mg total) by mouth daily. for blood pressure. 08/19/22   Pleas Koch, NP  ascorbic acid (VITAMIN C) 500 MG tablet Take 1 tablet (500 mg total) by mouth daily. 12/19/22 03/19/23  Val Riles, MD  aspirin 81 MG chewable tablet Chew 1 tablet (81 mg total) by mouth daily. Hold for now and resume on 12/22/22 12/22/22   Val Riles, MD  Budeson-Glycopyrrol-Formoterol (BREZTRI AEROSPHERE) 160-9-4.8 MCG/ACT AERO Inhale 2 puffs into the lungs in the morning and at bedtime. 12/16/22   Baird Lyons D, MD  buPROPion (WELLBUTRIN XL) 300 MG 24 hr tablet Take 1 tablet (300 mg total) by mouth daily with  breakfast. for anxiety and depression. 11/10/22   Pleas Koch, NP  busPIRone (BUSPAR) 15 MG tablet Take 30 mg by mouth 2 (two) times daily.    [provider]  Calcium Carb-Cholecalciferol (CALCIUM/VITAMIN D PO) Take 1 tablet by mouth daily.    [provider]  clonazePAM (KLONOPIN) 1 MG tablet Take 1 mg by mouth at bedtime.    [provider]  conjugated estrogens (PREMARIN) vaginal cream Apply one pea-sized amount around the opening of the urethra daily for 2 weeks, then 3 times weekly moving forward. 08/20/22   Vaillancourt, Aldona Bar, PA-C  Continuous Blood Gluc Sensor (FREESTYLE LIBRE 3 SENSOR) MISC Place 1 sensor on the skin every 14 days. Use to check glucose continuously 12/19/22   Pleas Koch, NP  ferrous sulfate 325 (65 FE) MG tablet Take 1 tablet (325 mg total) by mouth daily with breakfast. 12/19/22 03/19/23  Val Riles, MD  glipiZIDE (GLUCOTROL XL) 2.5 MG 24 hr tablet TAKE 1 TABLET (2.5 MG TOTAL) BY MOUTH DAILY WITH BREAKFAST. FOR DIABETES. 08/04/22   Pleas Koch, NP  Melatonin 10 MG TABS Take 10 mg by mouth at bedtime.    [provider]  memantine (NAMENDA) 5 MG tablet Take 1 tablet (5 mg total) by mouth 2 (two) times daily. For memory. 07/14/22   Pleas Koch, NP  pantoprazole (PROTONIX) 40 MG tablet Take  1 tablet (40 mg total) by mouth daily. 12/19/22 03/19/23  Val Riles, MD  rosuvastatin (CRESTOR) 20 MG tablet TAKE 1 TABLET (20 MG TOTAL) BY MOUTH EVERY EVENING. FOR CHOLESTEROL. 07/13/22   Pleas Koch, NP  Tiotropium Bromide-Olodaterol (STIOLTO RESPIMAT) 2.5-2.5 MCG/ACT AERS Inhale 2 each into the lungs in the morning.    [provider]      Allergies    Lexapro [escitalopram oxalate], Neosporin  [neomycin-polymyxin-gramicidin], and Neosporin [bacitracin-polymyxin b]    Review of Systems   Review of Systems  Psychiatric/Behavioral:  Positive for confusion.   All other systems reviewed and are  negative.   Physical Exam Updated Vital Signs BP (!) 122/53   Pulse 87   Resp 16   Ht 5\' 2"  (1.575 m)   Wt 45 kg   SpO2 100%   BMI 18.15 kg/m  Physical Exam Vitals and nursing note reviewed.  HENT:     Head:     Comments: 2 cm laceration of the right scalp area.  Bleeding is controlled.    Nose: Nose normal.     Mouth/Throat:     Mouth: Mucous membranes are moist.  Eyes:     Extraocular Movements: Extraocular movements intact.     Pupils: Pupils are equal, round, and reactive to light.  Cardiovascular:     Rate and Rhythm: Normal rate and regular rhythm.     Pulses: Normal pulses.     Heart sounds: Normal heart sounds.  Pulmonary:     Effort: Pulmonary effort is normal.     Breath sounds: Normal breath sounds.  Abdominal:     General: Abdomen is flat.  Musculoskeletal:        General: Normal range of motion.     Cervical back: Normal range of motion and neck supple.     Comments: No spine tenderness and no signs of extremity trauma.  Skin:    General: Skin is warm.     Capillary Refill: Capillary refill takes less than 2 seconds.  Neurological:     General: No focal deficit present.     Mental Status: [name] is alert.     Comments: Confused and repetitive questioning  Psychiatric:        Mood and Affect: Mood normal.        Behavior: Behavior normal.     ED Results / Procedures / Treatments   Labs (all labs ordered are listed, but only abnormal results are displayed) Labs Reviewed  CBC WITH DIFFERENTIAL/PLATELET  COMPREHENSIVE METABOLIC PANEL    EKG None  Radiology DG Pelvis Portable  Result Date: 01/11/2023 CLINICAL DATA:  fall EXAM: PORTABLE PELVIS 1-2 VIEWS COMPARISON:  X-ray pelvis 09/07/2022 FINDINGS: Total right hip arthroplasty. No radiographic evidence suggest surgical hardware complication. Frontal view of the left hip demonstrates no acute displaced fracture or dislocation. There is no evidence of pelvic fracture or diastasis. No  severe arthropathy. No pelvic bone lesions are seen. VP shunt noted overlying the right abdomen with tip terminating within the left pelvis. No findings of VP shunt discontinuity or kink. IMPRESSION: Negative. Electronically Signed   By: Iven Finn M.D.   On: 01/11/2023 21:48   DG Chest Port 1 View  Result Date: 01/11/2023 CLINICAL DATA:  fall EXAM: PORTABLE CHEST 1 VIEW COMPARISON:  CT head 01/07/2023, CT angio chest 09/06/2022 FINDINGS: VP shunt overlies the right chest no findings of kinking or discontinuity. The heart and mediastinal contours are within normal limits. Aortic calcification. Surgical  clips along the aortic arch/left hilar region are noted. Left base atelectasis. Vague airspace opacity along the left apex with known pericentimeter left apical pulmonary nodule not well visualized or possibly removed. No focal consolidation. No pulmonary edema. Nonspecific blunting of left costophrenic angle with likely trace pleural effusion. No right pleural effusion. No pneumothorax. No acute osseous abnormality. IMPRESSION: 1. Likely trace left pleural effusion. 2. Aortic Atherosclerosis (ICD10-I70.0). Electronically Signed   By: Iven Finn M.D.   On: 01/11/2023 21:44    Procedures Procedures   LACERATION REPAIR Performed by: Wandra Arthurs Authorized by: Wandra Arthurs Consent: Verbal consent obtained. Risks and benefits: risks, benefits and alternatives were discussed Consent given by: patient Patient identity confirmed: provided demographic data Prepped and Draped in normal sterile fashion Wound explored  Laceration Location: R forehead  Laceration Length: 2 cm  No Foreign Bodies seen or palpated  Anesthesia: none  Irrigation method: syringe Amount of cleaning: standard  Skin closure: dermabond   Patient tolerance: Patient tolerated the procedure well with no immediate complications.   CRITICAL CARE Performed by: Wandra Arthurs   Total critical care time: 30  minutes  Critical care time was exclusive of separately billable procedures and treating other patients.  Critical care was necessary to treat or prevent imminent or life-threatening deterioration.  Critical care was time spent personally by me on the following activities: development of treatment plan with patient and/or surrogate as well as nursing, discussions with consultants, evaluation of patient's response to treatment, examination of patient, obtaining history from patient or surrogate, ordering and performing treatments and interventions, ordering and review of laboratory studies, ordering and review of radiographic studies, pulse oximetry and re-evaluation of patient's condition.   Medications Ordered in ED Medications  sodium chloride 0.9 % bolus 1,000 mL (has no administration in time range)    ED Course/ Medical Decision Making/ A&P                             Medical Decision Making Kimari A Arakelian is a 77 y.o. adult here presenting with fall.  Patient has recurrent fall.  Patient does have signs of head injury.  Plan to get CT head and neck and face to r/o fracture or bleeding.  Will also get chest and pelvis x-rays.  Will get basic blood work as well.  11:45 PM Labs reviewed and Hg is 8.0, close to baseline. CT showed acute subdural hemorrhage.  Discussed case with Dr. Arnoldo Morale from neurosurgery.  He recommends 6-hour repeat CT and with stable, patient can be discharged back to facility.  I updated patient and daughter. Signed out to Dr. Betsey Holiday in the ED.   Problems Addressed: Subdural hemorrhage ([place]): acute illness or injury  Amount and/or Complexity of Data Reviewed Labs: ordered. Decision-making details documented in ED Course. Radiology: ordered and independent interpretation performed. Decision-making details documented in ED Course. ECG/medicine tests: ordered.  Risk Prescription drug management.   Final Clinical Impression(s) / ED Diagnoses Final diagnoses:   None    Rx / DC Orders ED Discharge Orders     None         Drenda Freeze, MD 01/11/23 2346    Drenda Freeze, MD 01/12/23 [phone]

## 2023-01-11 NOTE — ED Triage Notes (Addendum)
BIB EMS from [facility] for multiple falls. Today patient slipped on tile floor. Hematoma noted on R.Forehead. Repetitive questioning, confusion, not at baseline currently oriented to person. Baseline is Aox3 per staff. Not on blood thinners

## 2023-01-11 NOTE — ED Notes (Signed)
Daughter Insiya Oshea [phone] would like an update asap

## 2023-01-12 ENCOUNTER — Emergency Department (HOSPITAL_COMMUNITY): Payer: Medicare PPO

## 2023-01-12 DIAGNOSIS — J440 Chronic obstructive pulmonary disease with acute lower respiratory infection: Secondary | ICD-10-CM | POA: Diagnosis not present

## 2023-01-12 DIAGNOSIS — R296 Repeated falls: Secondary | ICD-10-CM | POA: Diagnosis not present

## 2023-01-12 DIAGNOSIS — M6281 Muscle weakness (generalized): Secondary | ICD-10-CM | POA: Diagnosis not present

## 2023-01-12 DIAGNOSIS — F445 Conversion disorder with seizures or convulsions: Secondary | ICD-10-CM | POA: Diagnosis not present

## 2023-01-12 DIAGNOSIS — J9621 Acute and chronic respiratory failure with hypoxia: Secondary | ICD-10-CM | POA: Diagnosis not present

## 2023-01-12 DIAGNOSIS — D638 Anemia in other chronic diseases classified elsewhere: Secondary | ICD-10-CM | POA: Diagnosis not present

## 2023-01-12 DIAGNOSIS — I10 Essential (primary) hypertension: Secondary | ICD-10-CM | POA: Diagnosis not present

## 2023-01-12 DIAGNOSIS — G912 (Idiopathic) normal pressure hydrocephalus: Secondary | ICD-10-CM | POA: Diagnosis not present

## 2023-01-12 NOTE — ED Notes (Signed)
Called [facility] to transport patient to [facility].

## 2023-01-12 NOTE — ED Notes (Signed)
Patient transported to CT 

## 2023-01-12 NOTE — ED Notes (Addendum)
Attempted to call daughter x3 unable to get through to give update. Automated "Call could not be completed".   Daughter updated.

## 2023-01-12 NOTE — ED Provider Notes (Signed)
Patient signed out to me by Dr. Darl Householder.  Patient had a small subdural secondary to a fall seen on CT scan earlier today.  Patient was scheduled for repeat CT which has been performed.  Repeat CT is stable, no further progression or bleeding.  Will discharge patient per instructions from Dr. Darl Householder and his discussion with neurosurgery.   Orpah Greek, MD 01/12/23 [phone]

## 2023-01-13 DIAGNOSIS — J9611 Chronic respiratory failure with hypoxia: Secondary | ICD-10-CM | POA: Diagnosis not present

## 2023-01-13 DIAGNOSIS — F0390 Unspecified dementia without behavioral disturbance: Secondary | ICD-10-CM | POA: Diagnosis not present

## 2023-01-13 DIAGNOSIS — J441 Chronic obstructive pulmonary disease with (acute) exacerbation: Secondary | ICD-10-CM | POA: Diagnosis not present

## 2023-01-13 DIAGNOSIS — M6281 Muscle weakness (generalized): Secondary | ICD-10-CM | POA: Diagnosis not present

## 2023-01-14 DIAGNOSIS — R296 Repeated falls: Secondary | ICD-10-CM | POA: Diagnosis not present

## 2023-01-15 ENCOUNTER — Encounter: Payer: Self-pay | Admitting: Neurosurgery

## 2023-01-15 ENCOUNTER — Ambulatory Visit (INDEPENDENT_AMBULATORY_CARE_PROVIDER_SITE_OTHER): Payer: Medicare PPO | Admitting: Neurosurgery

## 2023-01-15 VITALS — BP 126/58 | Ht 62.0 in | Wt 99.0 lb

## 2023-01-15 DIAGNOSIS — Z09 Encounter for follow-up examination after completed treatment for conditions other than malignant neoplasm: Secondary | ICD-10-CM

## 2023-01-15 DIAGNOSIS — G912 (Idiopathic) normal pressure hydrocephalus: Secondary | ICD-10-CM | POA: Diagnosis not present

## 2023-01-15 DIAGNOSIS — Z982 Presence of cerebrospinal fluid drainage device: Secondary | ICD-10-CM

## 2023-01-15 DIAGNOSIS — T85618D Breakdown (mechanical) of other specified internal prosthetic devices, implants and grafts, subsequent encounter: Secondary | ICD-10-CM

## 2023-01-15 NOTE — Progress Notes (Signed)
   REFERRING PHYSICIAN:  No referring provider defined for this encounter.  DOS: 09/19/22  Removal and replacement of ventriculoperitoneal shunt   HISTORY OF PRESENT ILLNESS: Amanda Porter is status post Removal and replacement of ventriculoperitoneal shunt.  She has had increasing falls over the past couple of months.  She had an MRI scan performed at [facility] where they reprogrammed her shunt valve afterwards to its original setting.  Her daughter reports that she has had increasing confusion.      PHYSICAL EXAMINATION:  NEUROLOGICAL:  General: In no acute distress.   Awake and alert. Knows her name. States she is at clinic and that it is 69. Pupils equal round and reactive to light.  Facial tone is symmetric.    Strength: Side Biceps Triceps Deltoid Interossei Grip Wrist Ext. Wrist Flex.  R 5 5 5 5 5 5 5   L 5 5 - 5 5 5 5    Incision c/d/I, staples have been removed.  Abdominal incision is healing well. No signs of erythema or infection noted.   Imaging:  CT scan from January 12, 2023 shows decreased ventricular size versus preoperative, but overall stable since her past CT scans.   Assessment / Plan: Amanda Porter is doing fair s/p above surgery.  Given her worsening condition, we will try to readjust her valve to 100 and follow her up with a CT scan in 1 month.  r.    Meade Maw MD Dept of Neurosurgery

## 2023-01-16 DIAGNOSIS — G912 (Idiopathic) normal pressure hydrocephalus: Secondary | ICD-10-CM | POA: Diagnosis not present

## 2023-01-16 DIAGNOSIS — F445 Conversion disorder with seizures or convulsions: Secondary | ICD-10-CM | POA: Diagnosis not present

## 2023-01-16 DIAGNOSIS — F0390 Unspecified dementia without behavioral disturbance: Secondary | ICD-10-CM | POA: Diagnosis not present

## 2023-01-16 DIAGNOSIS — D638 Anemia in other chronic diseases classified elsewhere: Secondary | ICD-10-CM | POA: Diagnosis not present

## 2023-01-16 DIAGNOSIS — M6281 Muscle weakness (generalized): Secondary | ICD-10-CM | POA: Diagnosis not present

## 2023-01-16 DIAGNOSIS — J441 Chronic obstructive pulmonary disease with (acute) exacerbation: Secondary | ICD-10-CM | POA: Diagnosis not present

## 2023-01-16 DIAGNOSIS — J9611 Chronic respiratory failure with hypoxia: Secondary | ICD-10-CM | POA: Diagnosis not present

## 2023-01-16 DIAGNOSIS — R296 Repeated falls: Secondary | ICD-10-CM | POA: Diagnosis not present

## 2023-01-16 DIAGNOSIS — J440 Chronic obstructive pulmonary disease with acute lower respiratory infection: Secondary | ICD-10-CM | POA: Diagnosis not present

## 2023-01-16 DIAGNOSIS — I10 Essential (primary) hypertension: Secondary | ICD-10-CM | POA: Diagnosis not present

## 2023-01-16 DIAGNOSIS — J9621 Acute and chronic respiratory failure with hypoxia: Secondary | ICD-10-CM | POA: Diagnosis not present

## 2023-01-19 ENCOUNTER — Telehealth: Payer: Self-pay

## 2023-01-19 NOTE — Telephone Encounter (Signed)
Oh that's odd.... Mendel Ryder. Is this one of your patients? Can you assist in CGM device?

## 2023-01-19 NOTE — Telephone Encounter (Signed)
Spoke to patients daughter she states the freestyle libre 3 is not covered and would like a different CGM sent in that would be covered. She has not checked with her insurance to see which one would be covered, she wanted to reach out to Anda Kraft first to see if she knew of one that [facility] would cover without having to jump through hoops of calling them.

## 2023-01-20 ENCOUNTER — Telehealth: Payer: Self-pay

## 2023-01-20 ENCOUNTER — Other Ambulatory Visit: Payer: Self-pay | Admitting: Primary Care

## 2023-01-20 DIAGNOSIS — F32A Depression, unspecified: Secondary | ICD-10-CM

## 2023-01-20 DIAGNOSIS — I10 Essential (primary) hypertension: Secondary | ICD-10-CM

## 2023-01-20 DIAGNOSIS — J449 Chronic obstructive pulmonary disease, unspecified: Secondary | ICD-10-CM

## 2023-01-20 DIAGNOSIS — E1165 Type 2 diabetes mellitus with hyperglycemia: Secondary | ICD-10-CM

## 2023-01-20 NOTE — Telephone Encounter (Signed)
PA has been approved. Amy - please alert pharmacy and see if there is a copay. Inform patient of approval.

## 2023-01-20 NOTE — Telephone Encounter (Signed)
Thanks! You would want to speak with her daughter, [name] as she is her main care provider.

## 2023-01-20 NOTE — Telephone Encounter (Signed)
[facility], [place] 3 sensors require a PA. She is on  Submitted PA vis CMM. Key: [id number] Will await determination.

## 2023-01-20 NOTE — Progress Notes (Addendum)
Care Management & Coordination Services Pharmacy Team  Reason for Encounter: Appointment Reminder  Patient was contacted to schedule appt with clinical pharmacist. Appointment is a telephone appointment on 01/22/2023 at 10:00 am.  Chart review:  Recent office visits:  12/19/22 Freestyle Libre 3 ordered - awaiting PA 10/13/22 AWV 08/18/22 Alma Friendly, NP Normal Pressure Hydrocephalus No med changes  Recent consult visits:  01/15/23 Meade Maw, MD (Neurosurgery) S/P Ventriculoperitoneal Shunt Ordered: CT Head for 1 month f/u 12/03/22 Bethena Roys, PA Dementia No med changes F/U 3 months 11/26/22 CT Chest 11/12/22 Debroah Loop, PA (Urology) Recurrent UTI Ordered: Bladder Scan Stop (provider): Tamsulosin 0.4 mg F/ U 6 months 10/30/22 Meade Maw, MD (Neurosurgery) Normal Pressure Hydrocephalus  Change: Buspirone 45 mg  10/13/22 Karl Ito, NP Normal Pressure Hydrocephalus Stop (completed): Enoxaparin 40 mg Stop (completed): Insulin Aspart 0-9 units daily 10/02/22 Geronimo Boot, PA (Neurosurgery) Normal Pressure Hydrocephalus No medication changes F/U 4 weeks 09/30/22 Debroah Loop, PA (Urology) Incomplete bladder emptying No med changes F/U 4 weeks 08/25/22 Honor Loh, NP (Anesthesiology) Start: Potassium 10 meq due to ventriculoperitoneal shunt revision 08/12/22 Meade Maw, MD (Neurosurgery) Normal Pressure Hydrocephalus  Change: Buspirone 30 mg Change: Guaifenesin 600 mg 08/04/22 Debroah Loop, PA Start: Macrobid twice daily for 1 week  Hospital visits:  Charlene Brooke, PharmD to review  Star Rating Drugs:  Medication:  Last Fill: Day Supply Glipizide 2. 5 mg  12/29/2022 30 Rosuvastatin 20 mg 01/05/2023 90  Care Gaps: Annual wellness visit in last year? Yes 10/20/2022  If Diabetic: Last eye exam / retinopathy screening: Overdue Last diabetic foot exam: Overdue  Charlene Brooke, PharmD notified  Marijean Niemann, [place]  Pharmacy Assistant [phone]

## 2023-01-20 NOTE — Telephone Encounter (Signed)
No I have not worked with her before but would be happy to. I will look into CGM options.  Can you enter [id number] in the meantime? It looks like she has a lot of medications and may benefit from speaking with me anyway. Thanks!

## 2023-01-21 ENCOUNTER — Ambulatory Visit (INDEPENDENT_AMBULATORY_CARE_PROVIDER_SITE_OTHER): Payer: Medicare PPO | Admitting: Physician Assistant

## 2023-01-21 ENCOUNTER — Other Ambulatory Visit: Payer: Self-pay | Admitting: Primary Care

## 2023-01-21 VITALS — Ht 62.0 in | Wt 99.0 lb

## 2023-01-21 DIAGNOSIS — N390 Urinary tract infection, site not specified: Secondary | ICD-10-CM | POA: Diagnosis not present

## 2023-01-21 DIAGNOSIS — F331 Major depressive disorder, recurrent, moderate: Secondary | ICD-10-CM | POA: Diagnosis not present

## 2023-01-21 DIAGNOSIS — F411 Generalized anxiety disorder: Secondary | ICD-10-CM | POA: Diagnosis not present

## 2023-01-21 DIAGNOSIS — R300 Dysuria: Secondary | ICD-10-CM

## 2023-01-21 DIAGNOSIS — F03A3 Unspecified dementia, mild, with mood disturbance: Secondary | ICD-10-CM | POA: Diagnosis not present

## 2023-01-21 LAB — URINALYSIS, COMPLETE
Bilirubin, UA: NEGATIVE
Glucose, UA: NEGATIVE
Ketones, UA: NEGATIVE
Nitrite, UA: POSITIVE — AB
Specific Gravity, UA: >1.03 — ABNORMAL HIGH (ref 1.005–1.030)
Urobilinogen, Ur: 1 mg/dL (ref 0.2–1.0)
pH, UA: 5.5 (ref 5.0–7.5)

## 2023-01-21 LAB — MICROSCOPIC EXAMINATION: WBC, UA: >30 /hpf — AB (ref 0–5)

## 2023-01-21 MED ORDER — CEFUROXIME AXETIL 250 MG PO TABS: 250.0000 mg | ORAL_TABLET | Freq: Two times a day (BID) | ORAL | 0 refills | Status: DC

## 2023-01-21 NOTE — Telephone Encounter (Signed)
Please call patient's daughter:  Received refill request for folic acid vitamin. Is she actually taking this?

## 2023-01-21 NOTE — Telephone Encounter (Addendum)
Unable to reach patients daughter. Left voicemail to return call to our office.

## 2023-01-21 NOTE — Progress Notes (Signed)
01/21/2023 3:47 PM   Amanda Porter 10-25-1946 [id number]  CC: Chief Complaint  Patient presents with   Follow-up   HPI: Amanda Porter is a 77 y.o. female with PMH dementia, DM 2, CVA, intermittent difficulty voiding, possible recurrent UTI, and normal pressure hydrocephalus managed with VP shunt who presents today for evaluation of possible UTI.  She is accompanied today by her daughter, [name], who contributes to HPI.  Today they report a recent history of multiple falls including 1 in which she sustained a subdural hemorrhage.  She had been discharged to SNF, but is finally back home with her daughter.  As she reports that at some point at the SNF Flomax had been resumed, however she has stopped it again since her mother came back home.  [name] has been concerned for UTI for approximately the past 5 days.  She reports hallucinations, more frequent falls, urgency, frequency, and dysuria.  They deny fever, chills, nausea, or vomiting.  In-office catheterized UA today positive for trace intact blood, 1+ protein, nitrites, and 2+ leukocytes; urine microscopy with >30 WBCs/HPF, 3-10 RBCs/HPF, and many bacteria.  Measured residual 83mL.  PMH: Past Medical History:  Diagnosis Date   Acute respiratory failure requiring reintubation ([place]) 11/05/2021   Anemia    Anxiety    Anxiety and depression    COPD (chronic obstructive pulmonary disease) (HCC)    CVA (cerebral vascular accident) ([place])    CVA (cerebral vascular accident) ([place])    L sided deficits   Dementia ([place])    Depression    Diabetes mellitus ([place])    Type 2   Diabetes mellitus without complication ([place])    Dysphagia    Dysphasia    Falls    GERD (gastroesophageal reflux disease)    History of lung cancer    Hyperlipidemia    Hypertension    Influenza A 12/13/2021   Memory disturbance 11/20/2017   Normal pressure hydrocephalus ([place]) 2018   Nose colonized with MRSA 08/25/2022   a.) noted on pre-surgical swab prior to  VP shunt revision   Retinal detachment    Right   Right carotid bruit 11/20/2017   Tardive dyskinesia 02/01/2020   Tardive dyskinesia    Thrombosis    Arterial to lower extremity?    Surgical History: Past Surgical History:  Procedure Laterality Date   ESOPHAGOGASTRODUODENOSCOPY (EGD) WITH PROPOFOL N/A 12/16/2022   Procedure: ESOPHAGOGASTRODUODENOSCOPY (EGD) WITH PROPOFOL;  Surgeon: Lucilla Lame, MD;  Location: [facility] ENDOSCOPY;  Service: Endoscopy;  Laterality: N/A;   HIP ARTHROPLASTY Right 09/07/2022   Procedure: ARTHROPLASTY BIPOLAR HIP (HEMIARTHROPLASTY);  Surgeon: Lovell Sheehan, MD;  Location: [facility] ORS;  Service: Orthopedics;  Laterality: Right;   IR FL GUIDED LOC OF NEEDLE/CATH TIP FOR SPINAL INJECTION RT  07/25/2022   LUNG REMOVAL, PARTIAL     left upper lobe   SHUNT REVISION VENTRICULAR-PERITONEAL N/A 09/19/2022   Procedure: VENTRICULOPERITONEAL SHUNT REVISION;  Surgeon: Meade Maw, MD;  Location: [facility] ORS;  Service: Neurosurgery;  Laterality: N/A;   VENTRICULOPERITONEAL SHUNT  12/09/2016   VENTRICULOPERITONEAL SHUNT  10/2018    Home Medications:  Allergies as of 01/21/2023       Reactions   Lexapro [escitalopram Oxalate] Other (See Comments)   Dizziness, nausea, fatigue   Neosporin  [neomycin-polymyxin-gramicidin] Rash   (Neosporin)   Neosporin [bacitracin-polymyxin B] Rash        Medication List        Accurate as of January 21, 2023  3:47 PM. If you  have any questions, ask your nurse or doctor.          acetaminophen 325 MG tablet Commonly known as: TYLENOL Take 650 mg by mouth every 4 (four) hours as needed for mild pain.   Albuterol Sulfate 2.5 MG/0.5ML Nebu Inhale 2.5 mg into the lungs every 6 (six) hours as needed (shortness of breath). What changed: Another medication with the same name was changed. Make sure you understand how and when to take each.   albuterol 108 (90 Base) MCG/ACT inhaler Commonly known as: VENTOLIN HFA INHALE 2  PUFFS BY MOUTH EVERY 4 HOURS AS NEEDED What changed:  how much to take how to take this when to take this reasons to take this additional instructions   amLODipine 2.5 MG tablet Commonly known as: NORVASC Take 1 tablet (2.5 mg total) by mouth daily. for blood pressure.   ascorbic acid 500 MG tablet Commonly known as: VITAMIN C Take 1 tablet (500 mg total) by mouth daily.   aspirin 81 MG chewable tablet Chew 1 tablet (81 mg total) by mouth daily. Hold for now and resume on 12/22/22   Biofreeze 4 % Gel Generic drug: Menthol (Topical Analgesic) Apply topically in the morning and at bedtime.   Breztri Aerosphere 160-9-4.8 MCG/ACT Aero Generic drug: Budeson-Glycopyrrol-Formoterol Inhale 2 puffs into the lungs in the morning and at bedtime.   buPROPion 300 MG 24 hr tablet Commonly known as: WELLBUTRIN XL Take 1 tablet (300 mg total) by mouth daily with breakfast. for anxiety and depression.   busPIRone 15 MG tablet Commonly known as: BUSPAR Take 30 mg by mouth 2 (two) times daily.   CALCIUM/VITAMIN D PO Take 1 tablet by mouth daily.   clonazePAM 1 MG tablet Commonly known as: KLONOPIN Take 1 mg by mouth at bedtime.   ferrous sulfate 325 (65 FE) MG tablet Take 1 tablet (325 mg total) by mouth daily with breakfast.   folic acid 1 MG tablet Commonly known as: FOLVITE Take 1 mg by mouth daily.   FreeStyle Libre 3 Sensor Misc Place 1 sensor on the skin every 14 days. Use to check glucose continuously   glipiZIDE 2.5 MG 24 hr tablet Commonly known as: GLUCOTROL XL TAKE 1 TABLET (2.5 MG TOTAL) BY MOUTH DAILY WITH BREAKFAST. FOR DIABETES.   insulin aspart 100 UNIT/ML injection Commonly known as: novoLOG Give with meals.  If blood sugar is 120 to 150 give 1 units. If blood sugar is 151 to 200 give 2 units. If blood sugar is 201 to 250 give 3 units. If blood sugar is 251 to 300 give 5 units.if blood sugar is 301 to 350 give 7 units. If blood sugar is 351 to 400 give 9  units. If blood sugar is greater than 400, call MD   LACTOBACILLUS PROBIOTIC PO Take 1 capsule by mouth daily.   LORazepam 2 MG tablet Commonly known as: ATIVAN Take 2 mg by mouth once.   Melatonin 10 MG Tabs Take 10 mg by mouth at bedtime.   memantine 5 MG tablet Commonly known as: NAMENDA Take 1 tablet (5 mg total) by mouth 2 (two) times daily. For memory.   pantoprazole 40 MG tablet Commonly known as: PROTONIX Take 1 tablet (40 mg total) by mouth daily.   Premarin vaginal cream Generic drug: conjugated estrogens Apply one pea-sized amount around the opening of the urethra daily for 2 weeks, then 3 times weekly moving forward.   rosuvastatin 20 MG tablet Commonly known as: CRESTOR TAKE 1  TABLET (20 MG TOTAL) BY MOUTH EVERY EVENING. FOR CHOLESTEROL.   Stiolto Respimat 2.5-2.5 MCG/ACT Aers Generic drug: Tiotropium Bromide-Olodaterol Inhale 2 each into the lungs in the morning.   tamsulosin 0.4 MG Caps capsule Commonly known as: FLOMAX Take 0.4 mg by mouth daily after supper.        Allergies:  Allergies  Allergen Reactions   Lexapro [Escitalopram Oxalate] Other (See Comments)    Dizziness, nausea, fatigue   Neosporin  [Neomycin-Polymyxin-Gramicidin] Rash    (Neosporin)   Neosporin [Bacitracin-Polymyxin B] Rash    Family History: Family History  Problem Relation Age of Onset   Pneumonia Mother    Cancer Father    Hyperlipidemia Sister    Hypertension Sister    Heart disease Sister    Diabetes Sister    Hypertension Sister    Diabetes Sister    Pancreatic cancer Sister    Throat cancer Brother    Cancer Brother    Cancer Brother    Breast cancer Neg Hx     Social History:   reports that Babbette A. Alexopoulos "[name]" quit smoking about 5 years ago. Arlyss A. Siglin "[name]"'s smoking use included cigarettes. Tailynn A. Teare "Marlou Sa" has a 25.00 pack-year smoking history. Amethyst A. Keeler "Marlou Sa" has been exposed to tobacco smoke. Janazia A. Borrayo "Marlou Sa" has never used  smokeless tobacco. Blondell A. Steffenhagen "Marlou Sa" reports that Rolena A. Tsuchiya "Marlou Sa" does not currently use alcohol. Noya A. Kirstein "Marlou Sa" reports that Alveta A. Chuong "Marlou Sa" does not currently use drugs.  Physical Exam: Ht 5\' 2"  (1.575 m)   Wt 99 lb (44.9 kg)   BMI 18.11 kg/m   Constitutional:  Alert, thin and frail appearing HEENT: [place], AT Cardiovascular: No clubbing, cyanosis, or edema Respiratory: Normal respiratory effort, no increased work of breathing Skin: No rashes, bruises or suspicious lesions Neurologic: Grossly intact, no focal deficits, moving all 4 extremities Psychiatric: Normal mood and affect  Laboratory Data: Results for orders placed or performed in visit on 01/21/23  Microscopic Examination   Urine  Result Value Ref Range   WBC, UA >30 (A) 0 - 5 /hpf   RBC, Urine 3-10 (A) 0 - 2 /hpf   Epithelial Cells (non renal) 0-10 0 - 10 /hpf   Casts Present (A) None seen /lpf   Cast Type Hyaline casts N/A   Bacteria, UA Many (A) None seen/Few  Urinalysis, Complete  Result Value Ref Range   Specific Gravity, UA >1.030 (H) 1.005 - 1.030   pH, UA 5.5 5.0 - 7.5   Color, UA Yellow Yellow   Appearance Ur Hazy (A) Clear   Leukocytes,UA 2+ (A) Negative   Protein,UA 1+ (A) Negative/Trace   Glucose, UA Negative Negative   Ketones, UA Negative Negative   RBC, UA Trace (A) Negative   Bilirubin, UA Negative Negative   Urobilinogen, Ur 1.0 0.2 - 1.0 mg/dL   Nitrite, UA Positive (A) Negative   Microscopic Examination See below:    Assessment & Plan:   1. Dysuria Catheterized UA appears grossly infected today, will start empiric cefuroxime and send for culture for further evaluation. - Urinalysis, Complete - CULTURE, URINE COMPREHENSIVE - cefUROXime (CEFTIN) 250 MG tablet; Take 1 tablet (250 mg total) by mouth 2 (two) times daily with a meal for 7 days.  Dispense: 14 tablet; Refill: 0   Return if symptoms worsen or fail to improve.  Debroah Loop, PA-C  [facility]  [facility] [address] [place], [place] [address] [phone]  [phone]    

## 2023-01-21 NOTE — Telephone Encounter (Cosign Needed)
[facility]; Freestyle Libre 3 sensors will be $0 copay. They are getting a delivery tomorrow so the order will be ready then. I called patient's daughter and informed her.  Charlene Brooke, PharmD notified  Marijean Niemann, [place] Clinical Pharmacy Assistant [phone]

## 2023-01-21 NOTE — Progress Notes (Signed)
In and Out Catheterization  Patient is present today for a I & O catheterization due to Uti symptoms. Patient was cleaned and prepped in a sterile fashion with betadine . A 14FR cath was inserted no complications were noted , 10 ml of urine return was noted, urine was cloudy in color. A clean urine sample was collected for UA. Bladder was drained  And catheter was removed with out difficulty.    Performed by: Mariam Dollar, CMA

## 2023-01-22 ENCOUNTER — Ambulatory Visit (INDEPENDENT_AMBULATORY_CARE_PROVIDER_SITE_OTHER): Payer: Medicare PPO | Admitting: Primary Care

## 2023-01-22 ENCOUNTER — Telehealth: Payer: Self-pay

## 2023-01-22 ENCOUNTER — Encounter: Payer: Self-pay | Admitting: Primary Care

## 2023-01-22 ENCOUNTER — Telehealth: Payer: Self-pay | Admitting: Primary Care

## 2023-01-22 ENCOUNTER — Telehealth: Payer: Self-pay | Admitting: Pharmacist

## 2023-01-22 ENCOUNTER — Ambulatory Visit: Payer: Medicare PPO | Admitting: Pharmacist

## 2023-01-22 VITALS — BP 118/56 | HR 80 | Temp 97.3°F | Ht 62.0 in | Wt 97.0 lb

## 2023-01-22 DIAGNOSIS — G912 (Idiopathic) normal pressure hydrocephalus: Secondary | ICD-10-CM | POA: Diagnosis not present

## 2023-01-22 DIAGNOSIS — J9611 Chronic respiratory failure with hypoxia: Secondary | ICD-10-CM

## 2023-01-22 DIAGNOSIS — F0390 Unspecified dementia without behavioral disturbance: Secondary | ICD-10-CM | POA: Diagnosis not present

## 2023-01-22 DIAGNOSIS — F0393 Unspecified dementia, unspecified severity, with mood disturbance: Secondary | ICD-10-CM | POA: Diagnosis not present

## 2023-01-22 DIAGNOSIS — E1165 Type 2 diabetes mellitus with hyperglycemia: Secondary | ICD-10-CM | POA: Diagnosis not present

## 2023-01-22 DIAGNOSIS — J449 Chronic obstructive pulmonary disease, unspecified: Secondary | ICD-10-CM | POA: Diagnosis not present

## 2023-01-22 DIAGNOSIS — F32A Depression, unspecified: Secondary | ICD-10-CM | POA: Diagnosis not present

## 2023-01-22 DIAGNOSIS — E785 Hyperlipidemia, unspecified: Secondary | ICD-10-CM | POA: Diagnosis not present

## 2023-01-22 DIAGNOSIS — I10 Essential (primary) hypertension: Secondary | ICD-10-CM

## 2023-01-22 DIAGNOSIS — K264 Chronic or unspecified duodenal ulcer with hemorrhage: Secondary | ICD-10-CM | POA: Diagnosis not present

## 2023-01-22 DIAGNOSIS — F419 Anxiety disorder, unspecified: Secondary | ICD-10-CM | POA: Diagnosis not present

## 2023-01-22 DIAGNOSIS — E43 Unspecified severe protein-calorie malnutrition: Secondary | ICD-10-CM | POA: Diagnosis not present

## 2023-01-22 DIAGNOSIS — R296 Repeated falls: Secondary | ICD-10-CM

## 2023-01-22 DIAGNOSIS — E119 Type 2 diabetes mellitus without complications: Secondary | ICD-10-CM | POA: Diagnosis not present

## 2023-01-22 DIAGNOSIS — J9620 Acute and chronic respiratory failure, unspecified whether with hypoxia or hypercapnia: Secondary | ICD-10-CM | POA: Diagnosis not present

## 2023-01-22 DIAGNOSIS — E440 Moderate protein-calorie malnutrition: Secondary | ICD-10-CM

## 2023-01-22 DIAGNOSIS — F0394 Unspecified dementia, unspecified severity, with anxiety: Secondary | ICD-10-CM | POA: Diagnosis not present

## 2023-01-22 LAB — POCT GLYCOSYLATED HEMOGLOBIN (HGB A1C): Hemoglobin A1C: 6.8 % — AB (ref 4.0–5.6)

## 2023-01-22 NOTE — Telephone Encounter (Signed)
Spoke with patient's daughter [name] about medications and two main concerns were brought up:  1- They spoke with insurance med management yesterday who recommended ACE-I for kidney protection in diabetes. She has history of hypotension and recurrent falls so would need to be very cautious with changing BP meds. Last UACR from 01/2020 was normal (2.0). I recommend to repeat UACR to help determine if ACE/ARB would be beneficial (she does have active UTI currently so may be prudent to wait for this to resolve)  2 - pt has history of osteoporosis with recent hip fracture 08/2022, she was prescribed alendronate previously (started 2019) but last fill date for this was 03/2021 and med was removed from list 03/2022, compliance was unclear. Pt will struggle with oral bisphosphonate due to requirement to sit up for 30 min after dose. She is open to Prolia if insurance will cover it.  Advised pt I would pass on these discussion points to PCP, they have hospital f/u appt later today.

## 2023-01-22 NOTE — Telephone Encounter (Signed)
Adrian Blackwater called back and said also Home aid.

## 2023-01-22 NOTE — Telephone Encounter (Signed)
Please initiate PA for Prolia injection.  Thanks, Anda Kraft

## 2023-01-22 NOTE — Assessment & Plan Note (Signed)
Overall controlled. Following with psychiatry.  Continue bupropion XL 300 mg daily, buspirone 30 mg twice daily, clonazepam 1 mg at bedtime as needed.

## 2023-01-22 NOTE — Telephone Encounter (Signed)
Urine microalbumin kit provided to patient's daughter, patient will collect at home and then return.  Notification sent to [employer] [place] and Anda Kraft for [employer] authorization. Both patient and daughter agree.

## 2023-01-22 NOTE — Telephone Encounter (Signed)
Patients daughter stated she is resuming the folic acid after speaking with Ria Comment this morning and has plenty of pills on hand right now. She will let us know when they need a refill.

## 2023-01-22 NOTE — Assessment & Plan Note (Signed)
Noted on exam today with moderate weight loss since her last visit. Encouraged proper nutrition, avoidance of chewing on her gums which prevents her false teeth from fitting well.  Daughter closely monitors.

## 2023-01-22 NOTE — Progress Notes (Signed)
Care Management & Coordination Services Pharmacy Note  01/22/2023 Name:  WRAY HIBMA MRN:  [id number] DOB:  1946-04-28  Summary: -BP: pt has history of hypotension and recurrent falls, but insurance has recommended ACE/ARB in s/o DM; her last UACR was normal, would not change BP medications unless UACR is elevated -Osteoporosis: recent hip fracture 08/2022; T-score -2.5 (10/2021), she has been on alendronate previously (2019-2022), but compliance was unclear. She is not a good candidate for oral bisphosphonate now due to requirement to sit up for 30 min after dose; she is open to Prolia if insurance covers it  Recommendations (sent to PCP to discuss in OV) -Repeat UACR to help determine ACE/ARB benefit -Pursue Prolia benefits check  Follow up plan: -Health Concierge will call patient 1 month for update on UACR -Pharmacist follow up televisit scheduled for 6 months -PCP appt 01/22/23; Neurosurgery appt 02/19/23; Urology appt 05/12/23    Subjective: [name] is an 77 y.o. year old adult who is a primary patient of Pleas Koch, NP.  The care coordination team was consulted for assistance with disease management and care coordination needs.    Engaged with patient by telephone for initial visit.  Recent office visits: 10/13/22 Karl Ito, NP: Normal Pressure Hydrocephalus. D/C lovenox, novolog (completed course)  08/18/22 NP [name] OV: pre-op clearance - pending shunt revision 09/05/22.  Recent consult visits: 01/21/23 PA Vaillancourt (Urology): Dysuria - rx cefuroxime  01/15/23 Meade Maw, MD (Neurosurgery) S/P Ventriculoperitoneal Shunt Ordered: CT Head for 1 month f/u  12/03/22 Bethena Roys, PA (Neurology): Dementia  11/12/22 Debroah Loop, PA (Urology) Recurrent UTI; stop Flomax (trial off successful)  10/30/22 Meade Maw, MD (Neurosurgery) Normal Pressure Hydrocephalus  Change: Buspirone 45 mg (per pt preference)  10/02/22 Geronimo Boot, PA  (Neurosurgery) Normal Pressure Hydrocephalus No medication changes F/U 4 weeks  09/30/22 Debroah Loop, PA (Urology) Incomplete bladder emptying No med changes F/U 4 weeks 08/25/22 Honor Loh, NP (Anesthesiology) Start: Potassium 10 meq due to ventriculoperitoneal shunt revision 08/12/22 Meade Maw, MD (Neurosurgery) Normal Pressure Hydrocephalus  Change: Buspirone 30 mg Change: Guaifenesin 600 mg 08/04/22 Debroah Loop, PA Start: Macrobid twice daily for 1 week  Hospital visits: 01/11/23 ED visit [facility]): subdural hemorrhage (fall in nursing home). Repeat CT stable, no progression of bleeding.   01/07/23 ED visit [facility]): Fall. ? PTX on X-ray, not shown on CT.   12/26/22 ED visit ([facility]): generalized weakness - following hospitalization for PNA. MRI no evidence of CVA. F/u with neurosurgery.  12/16/22 - 12/18/22 Admission [facility]):  HCAP - d/c with Augmentin and doxy.  Upper GI Bleeding (AVM) - d/c on PPI once daily. Hold ASA x 5 days.  Iron deficiency - no IV iron d/t infection, PNA. D/C on oral iron. Recurrent fall - rec home PT  11/10/22 ED visit [facility]): UTI - rx levofloxacin, potassium  09/06/22 - 09/12/22 Admission [facility]): fracture of R femoral neck (mechanical fall). Underwent R hip surgery  Objective:  Lab Results  Component Value Date   CREATININE 0.73 01/11/2023   BUN 18 01/11/2023   GFR 76.69 10/13/2022   GFRNONAA >60 01/11/2023   GFRAA >60 03/24/2018   NA 139 01/11/2023   K 3.6 01/11/2023   CALCIUM 9.1 01/11/2023   CO2 28 01/11/2023   GLUCOSE 152 (H) 01/11/2023    Lab Results  Component Value Date/Time   HGBA1C 7.6 (H) 04/04/2022 10:25 AM   HGBA1C 7.1 (H) 11/09/2021 01:50 AM   GFR 76.69 10/13/2022 11:12 AM   GFR  85.21 08/18/2022 04:18 PM   MICROALBUR 1.5 01/17/2020 02:54 PM    Last diabetic Eye exam:  Lab Results  Component Value Date/Time   HMDIABEYEEXA No Retinopathy 09/24/2021 12:00 AM    Last diabetic Foot exam: No results found for:  "HMDIABFOOTEX"   Lab Results  Component Value Date   CHOL 130 03/14/2021   HDL 53.40 03/14/2021   LDLCALC 63 03/14/2021   TRIG 67.0 03/14/2021   CHOLHDL 2 03/14/2021       Latest Ref Rng & Units 01/11/2023   10:04 PM 11/10/2022    4:10 PM 09/06/2022   10:20 AM  Hepatic Function  Total Protein 6.5 - 8.1 g/dL 6.3  7.8  7.3   Albumin 3.5 - 5.0 g/dL 2.9  3.7  3.5   AST 15 - 41 U/L '14  16  15   '$ ALT 0 - 44 U/L '13  12  13   '$ Alk Phosphatase 38 - 126 U/L 51  73  60   Total Bilirubin 0.3 - 1.2 mg/dL 0.4  0.2  0.6     Lab Results  Component Value Date/Time   TSH 2.463 11/08/2021 10:16 AM       Latest Ref Rng & Units 01/11/2023   10:04 PM 01/07/2023    1:47 PM 12/28/2022    9:00 AM  CBC  WBC 4.0 - 10.5 K/uL 11.8  9.5  7.8   Hemoglobin 12.0 - 15.0 g/dL 8.0  9.1  9.7   Hematocrit 36.0 - 46.0 % 26.9  30.0  33.0   Platelets 150 - 400 K/uL 365  349  356    Iron/TIBC/Ferritin/ %Sat    Component Value Date/Time   IRON 18 (L) 12/16/2022 1451   TIBC 378 12/16/2022 1451   FERRITIN 28 12/16/2022 1451   IRONPCTSAT 5 (L) 12/16/2022 1451   IRONPCTSAT 9 (L) 10/14/2022 1422    Lab Results  Component Value Date/Time   VD25OH 28.01 (L) 04/04/2022 10:25 AM   VITAMINB12 978 (H) 12/16/2022 09:50 PM   VITAMINB12 833 04/04/2022 10:25 AM    Clinical ASCVD: No  The ASCVD Risk score (Arnett DK, et al., 2019) failed to calculate for the following reasons:   The patient has a prior MI or stroke diagnosis       10/20/2022   11:18 AM 03/14/2021    7:45 AM 03/26/2018    1:35 PM  Depression screen PHQ 2/9  Decreased Interest 0 3 3  Down, Depressed, Hopeless 0 3 3  PHQ - 2 Score 0 6 6  Altered sleeping  0 3  Tired, decreased energy  3 3  Change in appetite  0 3  Feeling bad or failure about yourself   0 1  Trouble concentrating  0 0  Moving slowly or fidgety/restless  0 3  Suicidal thoughts  0 0  PHQ-9 Score  9 19  Difficult doing work/chores  Somewhat difficult Extremely dIfficult      Social History   Tobacco Use  Smoking Status Former   Packs/day: 0.50   Years: 50.00   Total pack years: 25.00   Types: Cigarettes   Quit date: 08/01/2017   Years since quitting: 5.4   Passive exposure: Past  Smokeless Tobacco Never   BP Readings from Last 3 Encounters:  01/15/23 (!) 126/58  01/12/23 (!) 134/56  01/07/23 (!) 142/77   Pulse Readings from Last 3 Encounters:  01/12/23 62  01/07/23 88  12/29/22 92   Wt Readings from Last 3 Encounters:  01/21/23 99 lb (44.9 kg)  01/15/23 99 lb (44.9 kg)  01/11/23 99 lb 3.3 oz (45 kg)   BMI Readings from Last 3 Encounters:  01/21/23 18.11 kg/m  01/15/23 18.11 kg/m  01/11/23 18.15 kg/m    Allergies  Allergen Reactions   Lexapro [Escitalopram Oxalate] Other (See Comments)    Dizziness, nausea, fatigue   Neosporin  [Neomycin-Polymyxin-Gramicidin] Rash    (Neosporin)   Neosporin [Bacitracin-Polymyxin B] Rash    Medications Reviewed Today     Reviewed by Charlton Haws, RPH (Pharmacist) on 01/22/23 at 1053  Med List Status: <None>   Medication Order Taking? Sig Documenting Provider Last Dose Status Informant  acetaminophen (TYLENOL) 325 MG tablet [id number] Yes Take 650 mg by mouth every 4 (four) hours as needed for mild pain. [provider] Taking Active   albuterol (VENTOLIN HFA) 108 (90 Base) MCG/ACT inhaler [id number] Yes INHALE 2 PUFFS BY MOUTH EVERY 4 HOURS AS NEEDED  Patient taking differently: Inhale 2 puffs into the lungs every 4 (four) hours as needed for wheezing or shortness of breath.   Baird Lyons D, MD Taking Active Child  Albuterol Sulfate 2.5 MG/0.5ML NEBU [id number] Yes Inhale 2.5 mg into the lungs every 6 (six) hours as needed (shortness of breath). [provider] Taking Active Child  amLODipine (NORVASC) 2.5 MG tablet [id number] Yes Take 1 tablet (2.5 mg total) by mouth daily. for blood pressure. Pleas Koch, NP Taking Active Child  ascorbic acid (VITAMIN C) 500 MG  tablet [id number] Yes Take 1 tablet (500 mg total) by mouth daily. Val Riles, MD Taking Active Child           Med Note Rolan Bucco Jan 22, 2023 10:22 AM)    aspirin 81 MG chewable tablet [id number] Yes Chew 1 tablet (81 mg total) by mouth daily. Hold for now and resume on 12/22/22 Val Riles, MD Taking Active Child  buPROPion (WELLBUTRIN XL) 300 MG 24 hr tablet [id number] Yes Take 1 tablet (300 mg total) by mouth daily with breakfast. for anxiety and depression. Pleas Koch, NP Taking Active Child  busPIRone (BUSPAR) 15 MG tablet [id number] Yes Take 30 mg by mouth 2 (two) times daily. [provider] Taking Active Child  Calcium Carb-Cholecalciferol (CALCIUM/VITAMIN D PO) [id number] Yes Take 1 tablet by mouth daily. [provider] Taking Active Child  cefUROXime (CEFTIN) 250 MG tablet [id number]  Take 1 tablet (250 mg total) by mouth 2 (two) times daily with a meal for 7 days. Debroah Loop, PA-C  Active   clonazePAM (KLONOPIN) 1 MG tablet [id number] Yes Take 1 mg by mouth at bedtime. [provider] Taking Active Child  conjugated estrogens (PREMARIN) vaginal cream [id number] Yes Apply one pea-sized amount around the opening of the urethra daily for 2 weeks, then 3 times weekly moving forward. Debroah Loop, PA-C Taking Active Child  Continuous Blood Gluc Sensor (FREESTYLE LIBRE 3 SENSOR) [place] [id number]  Place 1 sensor on the skin every 14 days. Use to check glucose continuously Pleas Koch, NP  Active Child  ferrous sulfate 325 (65 FE) MG tablet [id number] Yes Take 1 tablet (325 mg total) by mouth daily with breakfast. Val Riles, MD Taking Active Child  folic acid (FOLVITE) 1 MG tablet [id number]  Take 1 mg by mouth daily. [provider]  Active   glipiZIDE (GLUCOTROL XL) 2.5 MG 24 hr tablet [id number] Yes TAKE 1 TABLET (2.5 MG TOTAL) BY MOUTH DAILY WITH BREAKFAST. FOR DIABETES. Carlis Abbott,  [name], NP Taking Active  Child  LACTOBACILLUS PROBIOTIC PO [id number] Yes Take 1 capsule by mouth daily. [provider] Taking Active   Melatonin 10 MG TABS [id number] Yes Take 10 mg by mouth at bedtime. [provider] Taking Active Child  memantine (NAMENDA) 5 MG tablet [id number] Yes Take 1 tablet (5 mg total) by mouth 2 (two) times daily. For memory. Pleas Koch, NP Taking Active Child  Menthol, Topical Analgesic, (BIOFREEZE) 4 % GEL [id number] Yes Apply topically in the morning and at bedtime. [provider] Taking Active   pantoprazole (PROTONIX) 40 MG tablet [id number] Yes Take 1 tablet (40 mg total) by mouth daily. Val Riles, MD Taking Active Child  rosuvastatin (CRESTOR) 20 MG tablet [id number] Yes TAKE 1 TABLET (20 MG TOTAL) BY MOUTH EVERY EVENING. FOR CHOLESTEROL. Pleas Koch, NP Taking Active Child  Tiotropium Bromide-Olodaterol (STIOLTO RESPIMAT) 2.5-2.5 MCG/ACT AERS [id number] Yes Inhale 2 each into the lungs in the morning. [provider] Taking Active Child            SDOH:  (Social Determinants of Health) assessments and interventions performed: No SDOH Interventions    Flowsheet Row Telephone from 12/23/2022 in [place] from 10/20/2022 in [place] at [facility] from 12/27/2021 in [place] at [facility] Visit from 03/14/2021 in [place] at [place] from 03/26/2018 in [place] at [facility] [place] from 03/02/2018 in [facility]  SDOH Interventions        Food Insecurity Interventions Intervention Not Indicated Intervention Not Indicated Intervention Not Indicated -- -- --  Housing Interventions Intervention Not Indicated Intervention Not Indicated -- -- -- --  Transportation Interventions Intervention Not  Indicated Intervention Not Indicated Intervention Not Indicated -- -- --  Utilities Interventions -- Intervention Not Indicated -- -- -- --  Depression Interventions/Treatment  -- -- -- Counseling Counseling, Currently on Treatment  [Left message with [facility] for counseling referral. ] Counseling  Financial Strain Interventions -- Intervention Not Indicated -- -- -- --  Stress Interventions -- Intervention Not Indicated -- -- -- --  Social Connections Interventions -- Intervention Not Indicated -- -- -- --       Medication Assistance: None required.  Patient affirms current coverage meets needs.  Medication Access: Within the past 30 days, how often has patient missed a dose of medication? 0 Is a pillbox or other method used to improve adherence? Yes  Factors that may affect medication adherence?  Dementia - daughter manages medications for her Are meds synced by current pharmacy? No  Are meds delivered by current pharmacy? No  Does patient experience delays in picking up medications due to transportation concerns? No   [facility] Services Reviewed: Is patient disadvantaged to use [facility]?: No  Current Rx insurance plan: [employer] Name and location of Current pharmacy:  [facility] #[id number][facility]Phone: [phone]: [phone] [facility] services reviewed with patient today?: No  Patient requests to transfer care to [facility]?: No  Reason patient declined to change pharmacies: Not mentioned at this visit  Compliance/Adherence/Medication fill history: Care Gaps: Foot exam (due 08/2019) UACR (due 01/2021) Eye exam (due 08/2022) A1c (due 10/2022)  Star-Rating Drugs: Glipizide - PDC 100% Rosuvastatin - PDC 100%   ASSESSMENT / PLAN  Hypertension (BP  goal <130/80) -Controlled - BP at goal in recent OV, trends toward low end of normal; pt reports history of hypotension/dizziness,  tolerating low dose amlodipine well now; she reports she spoke with insurance agent yesterday who suggested ACE/ARB for kidney protection -Current home BP readings: not checking -Current treatment: Amlodipine 2.5 mg daily - Appropriate, Effective, Safe, Accessible -Medications previously tried: n/a  -Educated on BP goals and benefits of medications for prevention of heart attack, stroke and kidney damage; -Reviewed role of ACE/ARB - indicated in diabetes with CKD or evidence of microalbuminuria; pt has normal GFR and normal UACR from 01/2020; recommend to re-check UACR to help determine benefit of ACE/ARB -Recommended to continue current medication for now  Hyperlipidemia: (LDL goal < 70) -Controlled - LDL 63 (03/2021) at goal -Hx CVA -Current treatment: Rosuvastatin 20 mg daily - Appropriate, Effective, Safe, Accessible Aspirin 81 mg daily - Appropriate, Effective, Safe, Accessible -Medications previously tried: n/a  -Educated on Cholesterol goals;  -Recommended to continue current medication  Diabetes (A1c goal <8%) -Query controlled - A1c 7.6 (03/2022), due for repeat A1c -Current home glucose readings: none available - glucometer is broken, she is picking up [employer] today; daughter uses FL3 so will handle her mother's monitoring -Current medications: Glipizide 2.5 mg daily - Appropriate, Query Effective Freestyle Libre 3 -Medications previously tried: metformin (GI) -Educated on A1c and blood sugar goals; Continuous glucose monitoring - pt daughter reports she wears a CGM and will be able to set up/monitor her mother's -Recommended to continue current medication  COPD (Goal: control symptoms and prevent exacerbations) -Controlled - pt is using Stiolto daily, she did not start Breztri (samples offered at pulmonary office); discussed difference between Stiolto (LABA/LAMA) and Breztri triple therapy - with addition of ICS pt would be required to wash out mouth after each use, and  would also need to increase #puffs/day - this is not convenient for patient/caregiver so she will stick with Stiolto -Gold Grade: Gold 3 (FEV1 30-49%) -Current COPD Classification:  A (low sx, 0-1 moderate exacerbations, no hospitalizations) -Pulmonary function testing: PFT 04/16/20-Severe obstruction (F/F 0.53, FEV1 43%), Severe restriction (TLC 40%), Severe DLCO deficit (33%)  -Exacerbations requiring treatment in last 6 months: 0 (last 01/2019) -Current treatment  Stiolto 2 puffs daily - Appropriate, Effective, Safe, Accessible Albuterol Neb - intermittent Albuterol HFA - uses while out -Medications previously tried: n/a  -Patient reports consistent use of maintenance inhaler -Frequency of rescue inhaler use: rare -Counseled on Proper inhaler technique; Benefits of consistent maintenance inhaler use -Recommended to continue current medication  Recurrent UTI (Goal: reduce UTI frequency) -Stable - follows with urology -Current treatment  Estrogen vaginal cream - Appropriate, Effective, Safe, Accessible Tamsulosin 0.4 mg daily - not taking -Medications previously tried: n/a -Reviewed benefits of vaginal estrogen for urinary health -Recommended to continue current medication  Recent GI Bleed (Goal: prevent recurrence) -Controlled -GI bleed 12/2022 in s/o pneumonia, IDA, aspirin -Current treatment  Pantoprazole 40 mg daily - Appropriate, Effective, Safe, Accessible -Medications previously tried: n/a -Reviewed benefits of PPI for GI protection against bleeding rather than just GERD; advised to continue PPI indefinitely  Anemia (Goal: maintain HGB WNL) -Query controlled - TSAT levels were low 12/2022 (5%) and recommendation was to recheck levels in ~3 months, considering iron infusions -Current treatment  Ferrous sulfate 325 mg daily - Appropriate, Query Effective Folic acid - not taking -Medications previously tried: n/a  -Recommended to continue current medication; recheck iron ~April  2024  Dementia (Goal: slow progression) -Stable -Memory changes  started 2017; VP shunt 2018 and revision 08/2022 -Hx of NPH (normal pressure hydrocephalus) -Follows with neurology, neurosurgery -Current treatment  Memantine 5 mg BID - Appropriate, Effective, Safe, Accessible -Medications previously tried: donepezil  -Reviewed purpose of memantine for slowing progression, not improvement -Recommended to continue current medication  Depression/Anxiety (Goal: manage symptoms) -Controlled - per pt report -PHQ9: 0 (10/2022) - minimal depression -GAD7: 15 (01/2018) - severe anxiety -Connected with psychiatry for mental health support -Current treatment: Bupropion XL 300 mg daily - Appropriate, Effective, Safe, Accessible Buspirone 30 mg BID - Appropriate, Effective, Safe, Accessible Clonazepam 1 mg HS - Appropriate, Effective, Safe, Accessible -Medications previously tried/failed: n/a -Educated on Benefits of medication for symptom control -Recommended to continue current medication  Osteoporosis (Goal: prevent fractures) -Uncontrolled - pt is not on treatment; she has hx of recurrent falls and continues to be high fall risk with dementia -Recent hip fracture 08/2022 -Last DEXA Scan: 10/2021   T-Score femoral neck: -2.5  T-Score total hip: -1.1  T-Score forearm radius: -1.1 -Patient is a candidate for pharmacologic treatment due to history of hip fracture  and T-Score < -2.5 in femoral neck -Current treatment  Calcium-Vitamin D - Appropriate, Effective, Safe, Accessible -Medications previously tried: alendronate (2019 - 2022) -Reviewed benefits of antiresorptive therapy for fracture prevention; pt is not a good candidate for oral bisphosphonate d/t requirement to sit up for 30 min after dose; she is open to Prolia if insurance will cover it -Recommend Prolia benefits check  Health Maintenance -Vaccine gaps: Shingrix -Takes Tylenol 1000 mg  -OTC: Vit C, ashwaganda   Charlene Brooke, PharmD, BCACP Clinical Pharmacist [facility] at [facility] [phone]

## 2023-01-22 NOTE — Telephone Encounter (Signed)
Home Health verbal orders Caller Name: Annada Name: [facility] number: [phone]  Requesting OT/PT/Skilled nursing/Social Work/Speech: PT, OT, nursing, speech   Reason:   Frequency: two times week for one week. once a week for 3 weeks then every other week for four weeks   Please forward to [facility] pool or providers CMA    Might have to contact [facility] to confirm orders, caller was talking to multiple people including myself at once.

## 2023-01-22 NOTE — Assessment & Plan Note (Signed)
Stable on exam.  Following with pulmonology, office notes reviewed from July 2023.  Continue continuous oxygen, albuterol nebulizers as needed, tiotropium bromide-olodaterol, 2 puffs daily.

## 2023-01-22 NOTE — Assessment & Plan Note (Signed)
Reviewed ED notes from January 07, 2023 in January 12, 2023. Continue to work with home health PT and nursing.  Note provided to return to adult daycare.

## 2023-01-22 NOTE — Patient Instructions (Signed)
Return the urine sample when able.  Follow-up with neurosurgery as scheduled.  It was a pleasure to see you today!

## 2023-01-22 NOTE — Assessment & Plan Note (Signed)
Controlled with A1c of 6.8 today. Continue glipizide XL 2.5 mg daily.  Urine microalbumin due and pending.

## 2023-01-22 NOTE — Assessment & Plan Note (Signed)
Reviewed hospitalization notes from January 2024.  Continue pantoprazole 40 mg daily.

## 2023-01-22 NOTE — Assessment & Plan Note (Signed)
Stable. Following with pulmonology, office notes reviewed from July 2023.  Continue continuous oxygen, albuterol nebulizers as needed, tiotropium bromide-olodaterol, 2 puffs daily.

## 2023-01-22 NOTE — Progress Notes (Signed)
Subjective:    Patient ID: Amanda Porter, adult    DOB: 08-23-1946, 77 y.o.   MRN: [id number]  HPI  Amanda Porter is a very pleasant 77 y.o. adult female with  a significant medical history including hypertension, COPD, chronic respiratory failure, normal pressure hydrocephalus, recurrent falls, CVA, type 2 diabetes, subdural hematoma who presents today for hospital follow up and diabetes follow up.  1) Recurrent Falls/Normal Pressure Hydrocephalus/Subdural Hematoma:   She presented to [facility] on 01/07/23 for evaluation after a fall at home. She was getting out of bed, slipped, and fell hitting the back of her head on the ground. She underwent chest xray which was questionable for small pneumothorax so she underwent CT Chest which was negative. She also underwent CT C-spine which was negative. She was discharged home later that day.  She presented to [facility] on 01/11/23 for altered mental status and fall at home. She underwent CT head which showed acute subdural hemorrhage. Neurosurgery consulted. Repeat CT scan was obtained for evaluation of further bleed, no changes in hemorrhage so she was discharged home with instructions to follow up with PCP and neurosurgery.   Evaluated by neurosurgery on 01/15/23 shut was readjusted. She is due to see her neurosurgeon next month. She continues to notice intermittent dizziness and headaches. Three times weekly she is at an adult daycare center. Her daughter is trying to get her involved four days weekly but they don't have the funding. She is needing a note to return to daycare. Home health nursing and PT are coming to her home frequently to work on strength and balance. She doesn't complete home exercises outside of her PT visits.   2) Type 2 Diabetes:  Current medications include: Glipizide XL 2.5 mg daily   Last A1C: 7.6 in June 2023, 6.8 today Last Eye Exam: Up-to-date Last Foot Exam: Due Pneumonia Vaccination: Up-to-date Urine Microalbumin:  Pending Statin: Rosuvastatin  3) Anxiety and Depression: Following with psychiatry and is managed on bupropion XL 300 mg daily, buspirone 30 mg twice daily, clonazepam 1 mg at bedtime.   4) Chronic Respiratory Failure/COPD: Following with pulmonology and is managed on Stiolto 2.5-2.5 mcg, 2 puffs daily.  She is also managed on continuous oxygen, albuterol nebulizers as needed.  Last office visit with pulmonology was July 2023.  BP Readings from Last 3 Encounters:  01/22/23 (!) 118/56  01/15/23 (!) 126/58  01/12/23 (!) 134/56      Review of Systems  Respiratory:  Positive for shortness of breath.   Cardiovascular:  Negative for chest pain.  Neurological:  Positive for dizziness and headaches.  Psychiatric/Behavioral:  The patient is nervous/anxious.          Past Medical History:  Diagnosis Date   Acute respiratory failure requiring reintubation ([place]) 11/05/2021   Anemia    Anxiety    Anxiety and depression    COPD (chronic obstructive pulmonary disease) (HCC)    CVA (cerebral vascular accident) ([place])    CVA (cerebral vascular accident) ([place])    L sided deficits   Dementia ([place])    Depression    Diabetes mellitus ([place])    Type 2   Diabetes mellitus without complication ([place])    Dysphagia    Dysphasia    Falls    GERD (gastroesophageal reflux disease)    HCAP (healthcare-associated pneumonia) 12/16/2022   History of lung cancer    Hyperlipidemia    Hypertension    Influenza A 12/13/2021   Memory disturbance 11/20/2017  Normal pressure hydrocephalus ([place]) 2018   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    Social History   Socioeconomic History   Marital status: Divorced    Spouse name: Not on file   Number of children: 1   Years of  education: trade school   Highest education level: Not on file  Occupational History   Occupation: Retired  Tobacco Use   Smoking status: Former    Packs/day: 0.50    Years: 50.00    Total pack years: 25.00    Types: Cigarettes    Quit date: 08/01/2017    Years since quitting: 5.4    Passive exposure: Past   Smokeless tobacco: Never  Vaping Use   Vaping Use: Never used  Substance and Sexual Activity   Alcohol use: Not Currently   Drug use: Not Currently   Sexual activity: Not Currently  Other Topics Concern   Not on file  Social History Narrative   ** Merged History Encounter **       Moved from [place]. Lives with daughter. Right handed    Social Determinants of Health   Financial Resource Strain: Low Risk  (10/20/2022)   Overall Financial Resource Strain (CARDIA)    Difficulty of Paying Living Expenses: Not very hard  Food Insecurity: No Food Insecurity (12/23/2022)   Hunger Vital Sign    Worried About Running Out of Food in the Last Year: Never true    Ran Out of Food in the Last Year: Never true  Transportation Needs: No Transportation Needs (12/23/2022)   PRAPARE - Hydrologist (Medical): No    Lack of Transportation (Non-Medical): No  Physical Activity: Not on file  Stress: No Stress Concern Present (10/20/2022)   [place]    Feeling of Stress : Not at all  Social Connections: Not on file  Intimate Partner Violence: Not At Risk (10/20/2022)   Humiliation, Afraid, Rape, and Kick questionnaire    Fear of Current or Ex-Partner: No    Emotionally Abused: No    Physically Abused: No    Sexually Abused: No    Past Surgical History:  Procedure Laterality Date   ESOPHAGOGASTRODUODENOSCOPY (EGD) WITH PROPOFOL N/A 12/16/2022   Procedure: ESOPHAGOGASTRODUODENOSCOPY (EGD) WITH PROPOFOL;  Surgeon: Lucilla Lame, MD;  Location: [facility] ENDOSCOPY;  Service: Endoscopy;  Laterality:  N/A;   HIP ARTHROPLASTY Right 09/07/2022   Procedure: ARTHROPLASTY BIPOLAR HIP (HEMIARTHROPLASTY);  Surgeon: Lovell Sheehan, MD;  Location: [facility] ORS;  Service: Orthopedics;  Laterality: Right;   IR FL GUIDED LOC OF NEEDLE/CATH TIP FOR SPINAL INJECTION RT  07/25/2022   LUNG REMOVAL, PARTIAL     left upper lobe   SHUNT REVISION VENTRICULAR-PERITONEAL N/A 09/19/2022   Procedure: VENTRICULOPERITONEAL SHUNT REVISION;  Surgeon: Meade Maw, MD;  Location: [facility] ORS;  Service: Neurosurgery;  Laterality: N/A;   VENTRICULOPERITONEAL SHUNT  12/09/2016   VENTRICULOPERITONEAL SHUNT  10/2018    Family History  Problem Relation Age of Onset   Pneumonia Mother    Cancer Father    Hyperlipidemia Sister    Hypertension Sister    Heart disease Sister    Diabetes Sister    Hypertension Sister  Diabetes Sister    Pancreatic cancer Sister    Throat cancer Brother    Cancer Brother    Cancer Brother    Breast cancer Neg Hx     Allergies  Allergen Reactions   Lexapro [Escitalopram Oxalate] Other (See Comments)    Dizziness, nausea, fatigue   Neosporin  [Neomycin-Polymyxin-Gramicidin] Rash    (Neosporin)   Neosporin [Bacitracin-Polymyxin B] Rash    Current Outpatient Medications on File Prior to Visit  Medication Sig Dispense Refill   acetaminophen (TYLENOL) 325 MG tablet Take 650 mg by mouth every 4 (four) hours as needed for mild pain.     albuterol (VENTOLIN HFA) 108 (90 Base) MCG/ACT inhaler INHALE 2 PUFFS BY MOUTH EVERY 4 HOURS AS NEEDED (Patient taking differently: Inhale 2 puffs into the lungs every 4 (four) hours as needed for wheezing or shortness of breath.) 18 g 12   Albuterol Sulfate 2.5 MG/0.5ML NEBU Inhale 2.5 mg into the lungs every 6 (six) hours as needed (shortness of breath).     amLODipine (NORVASC) 2.5 MG tablet Take 1 tablet (2.5 mg total) by mouth daily. for blood pressure. 90 tablet 1   ascorbic acid (VITAMIN C) 500 MG tablet Take 1 tablet (500 mg total) by mouth  daily. 30 tablet 2   aspirin 81 MG chewable tablet Chew 1 tablet (81 mg total) by mouth daily. Hold for now and resume on 12/22/22     buPROPion (WELLBUTRIN XL) 300 MG 24 hr tablet Take 1 tablet (300 mg total) by mouth daily with breakfast. for anxiety and depression. 90 tablet 0   busPIRone (BUSPAR) 15 MG tablet Take 30 mg by mouth 2 (two) times daily.     Calcium Carb-Cholecalciferol (CALCIUM/VITAMIN D PO) Take 1 tablet by mouth daily.     cefUROXime (CEFTIN) 250 MG tablet Take 1 tablet (250 mg total) by mouth 2 (two) times daily with a meal for 7 days. 14 tablet 0   clonazePAM (KLONOPIN) 1 MG tablet Take 1 mg by mouth at bedtime.     conjugated estrogens (PREMARIN) vaginal cream Apply one pea-sized amount around the opening of the urethra daily for 2 weeks, then 3 times weekly moving forward. 30 g 4   Continuous Blood Gluc Sensor (FREESTYLE LIBRE 3 SENSOR) MISC Place 1 sensor on the skin every 14 days. Use to check glucose continuously 6 each 1   ferrous sulfate 325 (65 FE) MG tablet Take 1 tablet (325 mg total) by mouth daily with breakfast. 30 tablet 2   glipiZIDE (GLUCOTROL XL) 2.5 MG 24 hr tablet TAKE 1 TABLET (2.5 MG TOTAL) BY MOUTH DAILY WITH BREAKFAST. FOR DIABETES. 90 tablet 0   LACTOBACILLUS PROBIOTIC PO Take 1 capsule by mouth daily.     Melatonin 10 MG TABS Take 10 mg by mouth at bedtime.     memantine (NAMENDA) 5 MG tablet Take 1 tablet (5 mg total) by mouth 2 (two) times daily. For memory. 180 tablet 3   Menthol, Topical Analgesic, (BIOFREEZE) 4 % GEL Apply topically in the morning and at bedtime.     pantoprazole (PROTONIX) 40 MG tablet Take 1 tablet (40 mg total) by mouth daily. 30 tablet 2   rosuvastatin (CRESTOR) 20 MG tablet TAKE 1 TABLET (20 MG TOTAL) BY MOUTH EVERY EVENING. FOR CHOLESTEROL. 90 tablet 3   Tiotropium Bromide-Olodaterol (STIOLTO RESPIMAT) 2.5-2.5 MCG/ACT AERS Inhale 2 each into the lungs in the morning.     folic acid (FOLVITE) 1 MG tablet Take 1 mg  by mouth  daily.     No current facility-administered medications on file prior to visit.    BP (!) 118/56   Pulse 80   Temp (!) 97.3 F (36.3 C) (Temporal)   Ht 5\' 2"  (1.575 m)   Wt 97 lb (44 kg)   SpO2 97% Comment: w/ 3 L o2  BMI 17.74 kg/m  Objective:   Physical Exam Cardiovascular:     Rate and Rhythm: Normal rate and regular rhythm.  Pulmonary:     Effort: Pulmonary effort is normal.     Breath sounds: Normal breath sounds. No wheezing or rales.  Musculoskeletal:     Cervical back: Neck supple.  Skin:    General: Skin is warm and dry.  Neurological:     Mental Status: Amanda A Koran "[name]" is alert and oriented to person, place, and time.           Assessment & Plan:  Type 2 diabetes mellitus with hyperglycemia, without long-term current use of insulin (HCC) Assessment & Plan: Controlled with A1c of 6.8 today. Continue glipizide XL 2.5 mg daily.  Urine microalbumin due and pending.  Orders: -     Microalbumin / creatinine urine ratio; Future -     POCT glycosylated hemoglobin (Hb A1C)  Essential hypertension Assessment & Plan: Controlled.  Continue amlodipine 2.5 mg daily.   Chronic respiratory failure with hypoxia (HCC) Assessment & Plan: Stable. Following with pulmonology, office notes reviewed from July 2023.  Continue continuous oxygen, albuterol nebulizers as needed, tiotropium bromide-olodaterol, 2 puffs daily.   Chronic obstructive pulmonary disease, unspecified COPD type [facility]) Assessment & Plan: Stable on exam.  Following with pulmonology, office notes reviewed from July 2023.  Continue continuous oxygen, albuterol nebulizers as needed, tiotropium bromide-olodaterol, 2 puffs daily.   Bleeding duodenal ulcer Assessment & Plan: Reviewed hospitalization notes from January 2024.  Continue pantoprazole 40 mg daily.   Dementia without behavioral disturbance [facility]) Assessment & Plan: Follow with neurology.  Continue Namenda 5 mg twice  daily.   Normal pressure hydrocephalus (HCC) Assessment & Plan: Following with neurosurgery.  Recent emergency department notes, labs, imaging reviewed. Follow-up with neurosurgery as scheduled.   Anxiety and depression Assessment & Plan: Overall controlled. Following with psychiatry.  Continue bupropion XL 300 mg daily, buspirone 30 mg twice daily, clonazepam 1 mg at bedtime as needed.   Hyperlipidemia, unspecified hyperlipidemia type Assessment & Plan: Continue rosuvastatin 20 mg daily.   Malnutrition of moderate degree Assessment & Plan: Noted on exam today with moderate weight loss since her last visit. Encouraged proper nutrition, avoidance of chewing on her gums which prevents her false teeth from fitting well.  Daughter closely monitors.   Recurrent falls Assessment & Plan: Reviewed ED notes from January 07, 2023 in January 12, 2023. Continue to work with home health PT and nursing.  Note provided to return to adult daycare.         Pleas Koch, NP

## 2023-01-22 NOTE — Assessment & Plan Note (Signed)
Follow with neurology.  Continue Namenda 5 mg twice daily.

## 2023-01-22 NOTE — Assessment & Plan Note (Signed)
Continue rosuvastatin 20 mg daily.

## 2023-01-22 NOTE — Assessment & Plan Note (Signed)
Following with neurosurgery.  Recent emergency department notes, labs, imaging reviewed. Follow-up with neurosurgery as scheduled.

## 2023-01-22 NOTE — Assessment & Plan Note (Signed)
Controlled.  Continue amlodipine 2.5 mg daily.

## 2023-01-23 NOTE — Telephone Encounter (Signed)
Verbal order given to [facility] by telephone as instructed.

## 2023-01-23 NOTE — Telephone Encounter (Signed)
Prolia VOB initiated via [email or web address] 

## 2023-01-23 NOTE — Telephone Encounter (Signed)
Approved.  

## 2023-01-25 DIAGNOSIS — I10 Essential (primary) hypertension: Secondary | ICD-10-CM | POA: Diagnosis not present

## 2023-01-25 DIAGNOSIS — F0394 Unspecified dementia, unspecified severity, with anxiety: Secondary | ICD-10-CM | POA: Diagnosis not present

## 2023-01-25 DIAGNOSIS — F32A Depression, unspecified: Secondary | ICD-10-CM | POA: Diagnosis not present

## 2023-01-25 DIAGNOSIS — E43 Unspecified severe protein-calorie malnutrition: Secondary | ICD-10-CM | POA: Diagnosis not present

## 2023-01-25 DIAGNOSIS — E119 Type 2 diabetes mellitus without complications: Secondary | ICD-10-CM | POA: Diagnosis not present

## 2023-01-25 DIAGNOSIS — J9620 Acute and chronic respiratory failure, unspecified whether with hypoxia or hypercapnia: Secondary | ICD-10-CM | POA: Diagnosis not present

## 2023-01-25 DIAGNOSIS — J449 Chronic obstructive pulmonary disease, unspecified: Secondary | ICD-10-CM | POA: Diagnosis not present

## 2023-01-25 DIAGNOSIS — E785 Hyperlipidemia, unspecified: Secondary | ICD-10-CM | POA: Diagnosis not present

## 2023-01-25 DIAGNOSIS — F0393 Unspecified dementia, unspecified severity, with mood disturbance: Secondary | ICD-10-CM | POA: Diagnosis not present

## 2023-01-25 LAB — CULTURE, URINE COMPREHENSIVE

## 2023-01-26 ENCOUNTER — Encounter: Payer: Self-pay | Admitting: Oncology

## 2023-01-26 ENCOUNTER — Other Ambulatory Visit (HOSPITAL_COMMUNITY): Payer: Self-pay

## 2023-01-26 NOTE — Telephone Encounter (Signed)
Pharmacy Patient Advocate Encounter   Received notification that prior authorization for Prolia '60MG'$ /ML syringes is required/requested.  Per Test Claim: $64.00 with pharmacy benefit   PA submitted on 01/26/23 to (ins) [employer] via [place] Status is pending

## 2023-01-26 NOTE — Telephone Encounter (Signed)
Pharmacy Patient Advocate Encounter  Insurance verification completed.    The patient is insured through [employer]   Ran test claims for: Prolia 60mg.  Pharmacy benefit copay: $64.00 

## 2023-01-26 NOTE — Telephone Encounter (Signed)
Amanda Porter, patient's daughter, called back again. Advised that Sam is not in the office but message was sent to see where we can work patient in. Amanda Porter is worried with patient having the pressure, frequent urination, and confusion. She wonders if her dose of the medication needs to be stronger or longer course. Amanda Porter is going out of town on Friday and she can not cancel this trip and just wants to make sure patient is feeling better. She is trying to avoid going to ER. Patient is at home alone right now and Amanda Porter is at work. She is not sure if patient is urination a lot every time she goes or not. Amanda Porter asked if Sam could call her directly to discuss her concerns instead of going between us/mychart messages if at all possible. Her CB is [phone]

## 2023-01-26 NOTE — Telephone Encounter (Signed)
Patient's daughter called and left message on triage line to follow up on this also.

## 2023-01-27 ENCOUNTER — Other Ambulatory Visit: Payer: Self-pay

## 2023-01-27 ENCOUNTER — Inpatient Hospital Stay: Payer: Medicare PPO | Admitting: Primary Care

## 2023-01-27 ENCOUNTER — Ambulatory Visit: Payer: Medicare PPO | Admitting: Physician Assistant

## 2023-01-27 VITALS — BP 122/66 | HR 81 | Temp 97.6°F | Ht 62.0 in | Wt 97.0 lb

## 2023-01-27 DIAGNOSIS — N390 Urinary tract infection, site not specified: Secondary | ICD-10-CM

## 2023-01-27 DIAGNOSIS — M810 Age-related osteoporosis without current pathological fracture: Secondary | ICD-10-CM

## 2023-01-27 DIAGNOSIS — R300 Dysuria: Secondary | ICD-10-CM | POA: Diagnosis not present

## 2023-01-27 LAB — URINALYSIS, COMPLETE
Bilirubin, UA: NEGATIVE
Glucose, UA: NEGATIVE
Leukocytes,UA: NEGATIVE
Nitrite, UA: NEGATIVE
RBC, UA: NEGATIVE
Specific Gravity, UA: >1.03 — ABNORMAL HIGH (ref 1.005–1.030)
Urobilinogen, Ur: 0.2 mg/dL (ref 0.2–1.0)
pH, UA: 5.5 (ref 5.0–7.5)

## 2023-01-27 LAB — MICROSCOPIC EXAMINATION

## 2023-01-27 MED ORDER — DENOSUMAB 60 MG/ML ~~LOC~~ SOSY: 60.0000 mg | PREFILLED_SYRINGE | Freq: Once | SUBCUTANEOUS | 0 refills | Status: AC

## 2023-01-27 MED ORDER — SULFAMETHOXAZOLE-TRIMETHOPRIM 800-160 MG PO TABS: 1.0000 | ORAL_TABLET | Freq: Two times a day (BID) | ORAL | 0 refills | Status: AC

## 2023-01-27 NOTE — Telephone Encounter (Signed)
I spoke to Amanda Porter, patient's daughter.  I notified her that I am sending the Rx for Prolia to the pharmacy and when they receive it or it is scheduled to ship, I will make the appt.  Patient's labs from 01/11/23 are ok for inj.  Notified her of $64 pharmacy copay.

## 2023-01-27 NOTE — Progress Notes (Signed)
In and Out Catheterization  Patient is present today for a I & O catheterization due to UTI symptoms. Patient was cleaned and prepped in a sterile fashion with betadine . A 14FR cath was inserted no complications were noted , 65m of urine return was noted, urine was clear and yellow in color. A clean urine sample was collected for UA . Bladder was drained  And catheter was removed with out difficulty.    Performed by: HMariam Dollar RMA  Follow up/ Additional notes: PRN

## 2023-01-27 NOTE — Progress Notes (Signed)
01/27/2023 4:37 PM   Glendon A Chachere Mar 15, 1946 [id number]  CC: Chief Complaint  Patient presents with   Follow-up   HPI: Amanda Porter is a 77 y.o. female with PMH dementia, DM 2, CVA, intermittent difficulty voiding, possible recurrent UTI, and normal pressure hydrocephalus managed with VP shunt who presents today for evaluation of possible persistent UTI.  She is accompanied today by her daughter, [name], who contributes to HPI.  I saw her in clinic most recently 6 days ago for evaluation of UTI.  UA appeared grossly infected and I started her on empiric cefuroxime.  Her urine culture has since finalized with ampicillin resistant E. coli.  Today [name] reports that if anything her mother's condition has worsened since starting antibiotics.  She has been lethargic and increasingly confused.  She is hallucinating and reports frequent urination.  She has 2 days left of her prescribed 7-day course of cefuroxime.  In-office catheterized UA today positive for trace ketones and trace protein; urine microscopy pan negative.  Measured residual 73m.  PMH: Past Medical History:  Diagnosis Date   Acute respiratory failure requiring reintubation ([place] 11/05/2021   Anemia    Anxiety    Anxiety and depression    COPD (chronic obstructive pulmonary disease) (HCC)    CVA (cerebral vascular accident) ([place]    CV[place] (cerebral vascular accident) ([place]    L sided deficits   Dementia ([facility]    Depression    Diabetes mellitus ([facility]    Type 2   Diabetes mellitus without complication ([facility]    Dysphagia    Dysphasia    Falls    GERD (gastroesophageal reflux disease)    HCAP (healthcare-associated pneumonia) 12/16/2022   History of lung cancer    Hyperlipidemia    Hypertension    Influenza A 12/13/2021   Memory disturbance 11/20/2017   Normal pressure hydrocephalus ([place] 2018   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    Surgical History: Past Surgical History:  Procedure Laterality Date   ESOPHAGOGASTRODUODENOSCOPY (EGD) WITH PROPOFOL N/A 12/16/2022   Procedure: ESOPHAGOGASTRODUODENOSCOPY (EGD) WITH PROPOFOL;  Surgeon: WLucilla Lame MD;  Location: [facility];  Service: Endoscopy;  Laterality: N/A;   HIP ARTHROPLASTY Right 09/07/2022   Procedure: ARTHROPLASTY BIPOLAR HIP (HEMIARTHROPLASTY);  Surgeon: BLovell Sheehan MD;  Location: [facility] OR[facility];  Service: Orthopedics;  Laterality: Right;   IR FL GUIDED LOC OF NEEDLE/CATH TIP FOR SPINAL INJECTION RT  07/25/2022   LUNG REMOVAL, PARTIAL     left upper lobe   SHUNT REVISION VENTRICULAR-PERITONEAL N/A 09/19/2022   Procedure: VENTRICULOPERITONEAL SHUNT REVISION;  Surgeon: YMeade Maw MD;  Location: [facility] ORS;  Service: Neurosurgery;  Laterality: N/A;   VENTRICULOPERITONEAL SHUNT  12/09/2016   VENTRICULOPERITONEAL SHUNT  10/2018    Home Medications:  Allergies as of 01/27/2023       Reactions   Lexapro [escitalopram Oxalate] Other (See Comments)   Dizziness, nausea, fatigue   Neosporin  [neomycin-polymyxin-gramicidin] Rash   (Neosporin)   Neosporin [bacitracin-polymyxin B] Rash        Medication List        Accurate as of January 27, 2023  4:37 PM. If you have any questions, ask your nurse or doctor.  STOP taking these medications    cefUROXime 250 MG tablet Commonly known as: CEFTIN Stopped by: Debroah Loop, PA-C       TAKE these medications    acetaminophen 325 MG tablet Commonly known as: TYLENOL Take 650 mg by mouth every 4 (four) hours as needed for mild pain.   Albuterol Sulfate 2.5 MG/0.5ML Nebu Inhale 2.5 mg into the lungs every 6 (six) hours as needed (shortness of breath). What changed: Another medication with the same name was changed. Make sure you  understand how and when to take each.   albuterol 108 (90 Base) MCG/ACT inhaler Commonly known as: VENTOLIN HFA INHALE 2 PUFFS BY MOUTH EVERY 4 HOURS AS NEEDED What changed:  how much to take how to take this when to take this reasons to take this additional instructions   amLODipine 2.5 MG tablet Commonly known as: NORVASC Take 1 tablet (2.5 mg total) by mouth daily. for blood pressure.   ascorbic acid 500 MG tablet Commonly known as: VITAMIN C Take 1 tablet (500 mg total) by mouth daily.   aspirin 81 MG chewable tablet Chew 1 tablet (81 mg total) by mouth daily. Hold for now and resume on 12/22/22   Biofreeze 4 % Gel Generic drug: Menthol (Topical Analgesic) Apply topically in the morning and at bedtime.   buPROPion 300 MG 24 hr tablet Commonly known as: WELLBUTRIN XL Take 1 tablet (300 mg total) by mouth daily with breakfast. for anxiety and depression.   busPIRone 15 MG tablet Commonly known as: BUSPAR Take 30 mg by mouth 2 (two) times daily.   CALCIUM/VITAMIN D PO Take 1 tablet by mouth daily.   clonazePAM 1 MG tablet Commonly known as: KLONOPIN Take 1 mg by mouth at bedtime.   denosumab 60 MG/ML Sosy injection Commonly known as: PROLIA Inject 60 mg into the skin once for 1 dose. Started by: Velna Hatchet, RT   ferrous sulfate 325 (65 FE) MG tablet Take 1 tablet (325 mg total) by mouth daily with breakfast.   folic acid 1 MG tablet Commonly known as: FOLVITE Take 1 mg by mouth daily.   FreeStyle Libre 3 Sensor Misc Place 1 sensor on the skin every 14 days. Use to check glucose continuously   glipiZIDE 2.5 MG 24 hr tablet Commonly known as: GLUCOTROL XL TAKE 1 TABLET (2.5 MG TOTAL) BY MOUTH DAILY WITH BREAKFAST. FOR DIABETES.   LACTOBACILLUS PROBIOTIC PO Take 1 capsule by mouth daily.   Melatonin 10 MG Tabs Take 10 mg by mouth at bedtime.   memantine 5 MG tablet Commonly known as: NAMENDA Take 1 tablet (5 mg total) by mouth 2 (two) times  daily. For memory.   pantoprazole 40 MG tablet Commonly known as: PROTONIX Take 1 tablet (40 mg total) by mouth daily.   Premarin vaginal cream Generic drug: conjugated estrogens Apply one pea-sized amount around the opening of the urethra daily for 2 weeks, then 3 times weekly moving forward.   rosuvastatin 20 MG tablet Commonly known as: CRESTOR TAKE 1 TABLET (20 MG TOTAL) BY MOUTH EVERY EVENING. FOR CHOLESTEROL.   Stiolto Respimat 2.5-2.5 MCG/ACT Aers Generic drug: Tiotropium Bromide-Olodaterol Inhale 2 each into the lungs in the morning.   sulfamethoxazole-trimethoprim 800-160 MG tablet Commonly known as: BACTRIM DS Take 1 tablet by mouth 2 (two) times daily for 5 days. Started by: Debroah Loop, PA-C        Allergies:  Allergies  Allergen Reactions   Lexapro [Escitalopram Oxalate] Other (See Comments)  Dizziness, nausea, fatigue   Neosporin  [Neomycin-Polymyxin-Gramicidin] Rash    (Neosporin)   Neosporin [Bacitracin-Polymyxin B] Rash    Family History: Family History  Problem Relation Age of Onset   Pneumonia Mother    Cancer Father    Hyperlipidemia Sister    Hypertension Sister    Heart disease Sister    Diabetes Sister    Hypertension Sister    Diabetes Sister    Pancreatic cancer Sister    Throat cancer Brother    Cancer Brother    Cancer Brother    Breast cancer Neg Hx     Social History:   reports that Juri A. Vara "[name]" quit smoking about 5 years ago. Sheniqua A. Nehring "[name]"'s smoking use included cigarettes. Lu A. Brookover "Marlou Sa" has a 25.00 pack-year smoking history. Pharrah A. Prue "Marlou Sa" has been exposed to tobacco smoke. Cleopha A. Staheli "Marlou Sa" has never used smokeless tobacco. Lorren A. Reaves "Marlou Sa" reports that Earlene A. Kesling "Marlou Sa" does not currently use alcohol. Ravleen A. Ouderkirk "Marlou Sa" reports that Kalana A. Marando "Marlou Sa" does not currently use drugs.  Physical Exam: BP 122/66   Pulse 81   Temp 97.6 F (36.4 C)   Ht '5\' 2"'$  (1.575  m)   Wt 97 lb (44 kg)   BMI 17.74 kg/m   Constitutional: Awake and disoriented, no acute distress, nontoxic appearing HEENT: [place], AT Cardiovascular: No clubbing, cyanosis, or edema Respiratory: Normal respiratory effort, no increased work of breathing Skin: No rashes, bruises or suspicious lesions Neurologic: Grossly intact, no focal deficits, moving all 4 extremities  Laboratory Data: Results for orders placed or performed in visit on 01/27/23  Microscopic Examination   Urine  Result Value Ref Range   WBC, UA 0-5 0 - 5 /hpf   RBC, Urine 0-2 0 - 2 /hpf   Epithelial Cells (non renal) 0-10 0 - 10 /hpf   Bacteria, UA Few None seen/Few  Urinalysis, Complete  Result Value Ref Range   Specific Gravity, UA >1.030 (H) 1.005 - 1.030   pH, UA 5.5 5.0 - 7.5   Color, UA Yellow Yellow   Appearance Ur Clear Clear   Leukocytes,UA Negative Negative   Protein,UA Trace (A) Negative/Trace   Glucose, UA Negative Negative   Ketones, UA Trace (A) Negative   RBC, UA Negative Negative   Bilirubin, UA Negative Negative   Urobilinogen, Ur 0.2 0.2 - 1.0 mg/dL   Nitrite, UA Negative Negative   Microscopic Examination See below:    Assessment & Plan:   1. Recurrent UTI Her UA is significantly improved today over prior on culture appropriate antibiotics and she is afebrile, VSS in clinic.  Her residual is WNL.  Low concern for urinary retention or urosepsis at this time.  I am primarily suspicious of medication/infection induced delirium or worsening dementia.  [name] is primarily concerned about UTI and wonders about extending her antibiotic course.  We discussed that a standard treatment course for UTI is 5 days and have already extended her to 7, however we may switch her to a different antibiotic to see if if she tolerates it better.  Will extend an additional 3 days for total of 10 days of culture appropriate therapy, but I do not recommend extending her further than this.  We discussed that if she  does not improve after switching from cefuroxime to Bactrim, she needs to follow-up with PCP or neurology for further evaluation.  She expressed understanding. - Urinalysis, Complete - sulfamethoxazole-trimethoprim (BACTRIM DS) 800-160 MG  tablet; Take 1 tablet by mouth 2 (two) times daily for 5 days.  Dispense: 10 tablet; Refill: 0   Return if symptoms worsen or fail to improve.  Debroah Loop, PA-C  [facility] [address] [place], [place] [address] [phone]

## 2023-01-28 NOTE — Telephone Encounter (Signed)
Medical Benefit PA was approved:

## 2023-01-29 ENCOUNTER — Telehealth: Payer: Self-pay | Admitting: Primary Care

## 2023-01-29 NOTE — Telephone Encounter (Signed)
Lovena Le from [facility] called asking for permission to move the pt's speech therapy eval out to next week due to scheduling? Call back # [phone]

## 2023-01-29 NOTE — Telephone Encounter (Signed)
Called and advised Amanda Porter with [facility] of the approval to move speech therapy evaluation to next week. Advised to call back with any further questions.

## 2023-01-29 NOTE — Telephone Encounter (Signed)
I called [facility] at [phone], spoke to [place].  They will fill the Rx for Prolia.  Left vmail for [name], patient's daughter, with pharmacy phone # so she can call with payment/shipping info.  If [facility] not [facility], they will call her.  Once she has sch the delivery, I will sch appt for inj.

## 2023-01-29 NOTE — Telephone Encounter (Signed)
Approved.  

## 2023-02-02 NOTE — Telephone Encounter (Signed)
I called [facility], spoke to [place].  Patient has not called them to sch delivery.  Caryl Comes is making an appt for [facility] to call patient today to schedule payment/delivery for Prolia.

## 2023-02-03 ENCOUNTER — Emergency Department: Payer: Medicare PPO

## 2023-02-03 ENCOUNTER — Inpatient Hospital Stay: Payer: Medicare PPO | Admitting: Certified Registered"

## 2023-02-03 ENCOUNTER — Encounter
Admission: EM | Disposition: A | Discharge status: Rehabilitation Facility | Payer: Self-pay | Source: Home / Self Care | Attending: Internal Medicine

## 2023-02-03 ENCOUNTER — Other Ambulatory Visit: Payer: Self-pay

## 2023-02-03 ENCOUNTER — Inpatient Hospital Stay
Admission: EM | Admit: 2023-02-03 | Discharge: 2023-02-13 | DRG: 025 | Disposition: A | Discharge status: Rehabilitation Facility | Payer: Medicare PPO | Attending: Internal Medicine | Admitting: Internal Medicine

## 2023-02-03 DIAGNOSIS — Z8249 Family history of ischemic heart disease and other diseases of the circulatory system: Secondary | ICD-10-CM

## 2023-02-03 DIAGNOSIS — T8501XS Breakdown (mechanical) of ventricular intracranial (communicating) shunt, sequela: Secondary | ICD-10-CM | POA: Diagnosis not present

## 2023-02-03 DIAGNOSIS — Z800 Family history of malignant neoplasm of digestive organs: Secondary | ICD-10-CM

## 2023-02-03 DIAGNOSIS — Z9981 Dependence on supplemental oxygen: Secondary | ICD-10-CM | POA: Diagnosis not present

## 2023-02-03 DIAGNOSIS — S065X0A Traumatic subdural hemorrhage without loss of consciousness, initial encounter: Secondary | ICD-10-CM | POA: Diagnosis not present

## 2023-02-03 DIAGNOSIS — Z681 Body mass index (BMI) 19 or less, adult: Secondary | ICD-10-CM

## 2023-02-03 DIAGNOSIS — F0394 Unspecified dementia, unspecified severity, with anxiety: Secondary | ICD-10-CM | POA: Diagnosis present

## 2023-02-03 DIAGNOSIS — Z808 Family history of malignant neoplasm of other organs or systems: Secondary | ICD-10-CM

## 2023-02-03 DIAGNOSIS — Z96641 Presence of right artificial hip joint: Secondary | ICD-10-CM | POA: Diagnosis present

## 2023-02-03 DIAGNOSIS — Z982 Presence of cerebrospinal fluid drainage device: Secondary | ICD-10-CM | POA: Diagnosis not present

## 2023-02-03 DIAGNOSIS — D509 Iron deficiency anemia, unspecified: Secondary | ICD-10-CM | POA: Diagnosis not present

## 2023-02-03 DIAGNOSIS — E43 Unspecified severe protein-calorie malnutrition: Secondary | ICD-10-CM | POA: Diagnosis present

## 2023-02-03 DIAGNOSIS — I6200 Nontraumatic subdural hemorrhage, unspecified: Secondary | ICD-10-CM | POA: Diagnosis not present

## 2023-02-03 DIAGNOSIS — Z515 Encounter for palliative care: Secondary | ICD-10-CM | POA: Diagnosis not present

## 2023-02-03 DIAGNOSIS — Z87891 Personal history of nicotine dependence: Secondary | ICD-10-CM | POA: Diagnosis not present

## 2023-02-03 DIAGNOSIS — J9811 Atelectasis: Secondary | ICD-10-CM | POA: Diagnosis not present

## 2023-02-03 DIAGNOSIS — Z87440 Personal history of urinary (tract) infections: Secondary | ICD-10-CM

## 2023-02-03 DIAGNOSIS — F0393 Unspecified dementia, unspecified severity, with mood disturbance: Secondary | ICD-10-CM | POA: Diagnosis present

## 2023-02-03 DIAGNOSIS — Z86718 Personal history of other venous thrombosis and embolism: Secondary | ICD-10-CM

## 2023-02-03 DIAGNOSIS — I10 Essential (primary) hypertension: Secondary | ICD-10-CM | POA: Diagnosis present

## 2023-02-03 DIAGNOSIS — Z883 Allergy status to other anti-infective agents status: Secondary | ICD-10-CM

## 2023-02-03 DIAGNOSIS — W19XXXA Unspecified fall, initial encounter: Secondary | ICD-10-CM | POA: Diagnosis present

## 2023-02-03 DIAGNOSIS — Z1152 Encounter for screening for COVID-19: Secondary | ICD-10-CM

## 2023-02-03 DIAGNOSIS — J449 Chronic obstructive pulmonary disease, unspecified: Secondary | ICD-10-CM | POA: Diagnosis present

## 2023-02-03 DIAGNOSIS — G912 (Idiopathic) normal pressure hydrocephalus: Secondary | ICD-10-CM | POA: Diagnosis not present

## 2023-02-03 DIAGNOSIS — E1165 Type 2 diabetes mellitus with hyperglycemia: Secondary | ICD-10-CM | POA: Diagnosis not present

## 2023-02-03 DIAGNOSIS — T859XXA Unspecified complication of internal prosthetic device, implant and graft, initial encounter: Secondary | ICD-10-CM

## 2023-02-03 DIAGNOSIS — Z902 Acquired absence of lung [part of]: Secondary | ICD-10-CM

## 2023-02-03 DIAGNOSIS — R9431 Abnormal electrocardiogram [ECG] [EKG]: Secondary | ICD-10-CM | POA: Diagnosis present

## 2023-02-03 DIAGNOSIS — N3000 Acute cystitis without hematuria: Secondary | ICD-10-CM | POA: Diagnosis not present

## 2023-02-03 DIAGNOSIS — T380X5A Adverse effect of glucocorticoids and synthetic analogues, initial encounter: Secondary | ICD-10-CM | POA: Diagnosis present

## 2023-02-03 DIAGNOSIS — Z0389 Encounter for observation for other suspected diseases and conditions ruled out: Secondary | ICD-10-CM | POA: Diagnosis not present

## 2023-02-03 DIAGNOSIS — F418 Other specified anxiety disorders: Secondary | ICD-10-CM | POA: Diagnosis not present

## 2023-02-03 DIAGNOSIS — R4182 Altered mental status, unspecified: Secondary | ICD-10-CM | POA: Diagnosis not present

## 2023-02-03 DIAGNOSIS — Y712 Prosthetic and other implants, materials and accessory cardiovascular devices associated with adverse incidents: Secondary | ICD-10-CM | POA: Diagnosis present

## 2023-02-03 DIAGNOSIS — Z66 Do not resuscitate: Secondary | ICD-10-CM | POA: Diagnosis not present

## 2023-02-03 DIAGNOSIS — E785 Hyperlipidemia, unspecified: Secondary | ICD-10-CM | POA: Diagnosis present

## 2023-02-03 DIAGNOSIS — Z833 Family history of diabetes mellitus: Secondary | ICD-10-CM

## 2023-02-03 DIAGNOSIS — F32A Depression, unspecified: Secondary | ICD-10-CM | POA: Diagnosis present

## 2023-02-03 DIAGNOSIS — I69354 Hemiplegia and hemiparesis following cerebral infarction affecting left non-dominant side: Secondary | ICD-10-CM | POA: Diagnosis not present

## 2023-02-03 DIAGNOSIS — I6381 Other cerebral infarction due to occlusion or stenosis of small artery: Secondary | ICD-10-CM | POA: Diagnosis not present

## 2023-02-03 DIAGNOSIS — Z888 Allergy status to other drugs, medicaments and biological substances status: Secondary | ICD-10-CM

## 2023-02-03 DIAGNOSIS — G9349 Other encephalopathy: Secondary | ICD-10-CM | POA: Diagnosis not present

## 2023-02-03 DIAGNOSIS — D649 Anemia, unspecified: Secondary | ICD-10-CM | POA: Diagnosis present

## 2023-02-03 DIAGNOSIS — Z85118 Personal history of other malignant neoplasm of bronchus and lung: Secondary | ICD-10-CM

## 2023-02-03 DIAGNOSIS — T8501XA Breakdown (mechanical) of ventricular intracranial (communicating) shunt, initial encounter: Principal | ICD-10-CM

## 2023-02-03 DIAGNOSIS — M7502 Adhesive capsulitis of left shoulder: Secondary | ICD-10-CM | POA: Diagnosis present

## 2023-02-03 DIAGNOSIS — R4781 Slurred speech: Secondary | ICD-10-CM | POA: Diagnosis present

## 2023-02-03 DIAGNOSIS — G935 Compression of brain: Secondary | ICD-10-CM

## 2023-02-03 DIAGNOSIS — R519 Headache, unspecified: Secondary | ICD-10-CM | POA: Diagnosis not present

## 2023-02-03 DIAGNOSIS — S065XAA Traumatic subdural hemorrhage with loss of consciousness status unknown, initial encounter: Principal | ICD-10-CM

## 2023-02-03 DIAGNOSIS — E119 Type 2 diabetes mellitus without complications: Secondary | ICD-10-CM | POA: Diagnosis not present

## 2023-02-03 DIAGNOSIS — Z83438 Family history of other disorder of lipoprotein metabolism and other lipidemia: Secondary | ICD-10-CM

## 2023-02-03 DIAGNOSIS — F0392 Unspecified dementia, unspecified severity, with psychotic disturbance: Secondary | ICD-10-CM | POA: Diagnosis not present

## 2023-02-03 DIAGNOSIS — K219 Gastro-esophageal reflux disease without esophagitis: Secondary | ICD-10-CM | POA: Diagnosis present

## 2023-02-03 DIAGNOSIS — S7291XD Unspecified fracture of right femur, subsequent encounter for closed fracture with routine healing: Secondary | ICD-10-CM | POA: Diagnosis not present

## 2023-02-03 DIAGNOSIS — J9601 Acute respiratory failure with hypoxia: Secondary | ICD-10-CM | POA: Diagnosis not present

## 2023-02-03 DIAGNOSIS — Z7189 Other specified counseling: Secondary | ICD-10-CM | POA: Diagnosis not present

## 2023-02-03 DIAGNOSIS — D72829 Elevated white blood cell count, unspecified: Secondary | ICD-10-CM | POA: Diagnosis not present

## 2023-02-03 DIAGNOSIS — S06A0XA Traumatic brain compression without herniation, initial encounter: Secondary | ICD-10-CM | POA: Diagnosis present

## 2023-02-03 HISTORY — PX: BURR HOLE: SHX908

## 2023-02-03 LAB — COMPREHENSIVE METABOLIC PANEL
ALT: 15 U/L (ref 0–44)
AST: 28 U/L (ref 15–41)
Albumin: 3.5 g/dL (ref 3.5–5.0)
Alkaline Phosphatase: 63 U/L (ref 38–126)
Anion gap: 10 (ref 5–15)
BUN: 20 mg/dL (ref 8–23)
CO2: 25 mmol/L (ref 22–32)
Calcium: 9.1 mg/dL (ref 8.9–10.3)
Chloride: 101 mmol/L (ref 98–111)
Creatinine, Ser: 0.72 mg/dL (ref 0.44–1.00)
GFR, Estimated: >60 mL/min (ref >60)
Glucose, Bld: 150 mg/dL — ABNORMAL HIGH (ref 70–99)
Potassium: 4.2 mmol/L (ref 3.5–5.1)
Sodium: 136 mmol/L (ref 135–145)
Total Bilirubin: 0.4 mg/dL (ref 0.3–1.2)
Total Protein: 7.9 g/dL (ref 6.5–8.1)

## 2023-02-03 LAB — CBG MONITORING, ED
Glucose-Capillary: 132 mg/dL — ABNORMAL HIGH (ref 70–99)
Glucose-Capillary: 115 mg/dL — ABNORMAL HIGH (ref 70–99)

## 2023-02-03 LAB — TROPONIN I (HIGH SENSITIVITY): Troponin I (High Sensitivity): 6 ng/L (ref <18)

## 2023-02-03 LAB — CBC
HCT: 30 % — ABNORMAL LOW (ref 36.0–46.0)
Hemoglobin: 8.9 g/dL — ABNORMAL LOW (ref 12.0–15.0)
MCH: 26.3 pg (ref 26.0–34.0)
MCHC: 29.7 g/dL — ABNORMAL LOW (ref 30.0–36.0)
MCV: 88.5 fL (ref 80.0–100.0)
Platelets: 411 10*3/uL — ABNORMAL HIGH (ref 150–400)
RBC: 3.39 MIL/uL — ABNORMAL LOW (ref 3.87–5.11)
RDW: 16 % — ABNORMAL HIGH (ref 11.5–15.5)
WBC: 7.7 10*3/uL (ref 4.0–10.5)
nRBC: 0 % (ref 0.0–0.2)

## 2023-02-03 LAB — RESP PANEL BY RT-PCR (RSV, FLU A&B, COVID)  RVPGX2
Influenza A by PCR: NEGATIVE
Influenza B by PCR: NEGATIVE
Resp Syncytial Virus by PCR: NEGATIVE
SARS Coronavirus 2 by RT PCR: NEGATIVE

## 2023-02-03 SURGERY — CREATION, CRANIAL BURR HOLE
Anesthesia: General | Laterality: Right

## 2023-02-03 MED ORDER — PHENYLEPHRINE HCL (PRESSORS) 10 MG/ML IV SOLN
INTRAVENOUS | Status: DC | PRN
  Administered 2023-02-03 (×2): 160 ug via INTRAVENOUS
  Administered 2023-02-03 (×3): 80 ug via INTRAVENOUS

## 2023-02-03 MED ORDER — SODIUM CHLORIDE 0.9 % IV SOLN: INTRAVENOUS | Status: DC | PRN

## 2023-02-03 MED ORDER — ROCURONIUM BROMIDE 10 MG/ML (PF) SYRINGE
PREFILLED_SYRINGE | INTRAVENOUS | Status: AC
  Filled 2023-02-03: qty 10

## 2023-02-03 MED ORDER — 0.9 % SODIUM CHLORIDE (POUR BTL) OPTIME
TOPICAL | Status: DC | PRN
  Administered 2023-02-03: 200 mL

## 2023-02-03 MED ORDER — SUGAMMADEX SODIUM 200 MG/2ML IV SOLN
INTRAVENOUS | Status: DC | PRN
  Administered 2023-02-03: 100 mg via INTRAVENOUS

## 2023-02-03 MED ORDER — ONDANSETRON HCL 4 MG/2ML IJ SOLN
INTRAMUSCULAR | Status: AC
  Filled 2023-02-03: qty 2

## 2023-02-03 MED ORDER — FENTANYL CITRATE (PF) 100 MCG/2ML IJ SOLN
INTRAMUSCULAR | Status: AC
  Filled 2023-02-03: qty 2

## 2023-02-03 MED ORDER — DEXAMETHASONE SODIUM PHOSPHATE 10 MG/ML IJ SOLN
INTRAMUSCULAR | Status: DC | PRN
  Administered 2023-02-03: 5 mg via INTRAVENOUS

## 2023-02-03 MED ORDER — DOCUSATE SODIUM 100 MG PO CAPS
100.0000 mg | ORAL_CAPSULE | Freq: Two times a day (BID) | ORAL | Status: DC | PRN
  Administered 2023-02-13: 100 mg via ORAL
  Filled 2023-02-03: qty 1

## 2023-02-03 MED ORDER — DEXAMETHASONE SODIUM PHOSPHATE 10 MG/ML IJ SOLN
INTRAMUSCULAR | Status: AC
  Filled 2023-02-03: qty 1

## 2023-02-03 MED ORDER — FENTANYL CITRATE (PF) 100 MCG/2ML IJ SOLN
INTRAMUSCULAR | Status: DC | PRN
  Administered 2023-02-03 (×3): 25 ug via INTRAVENOUS

## 2023-02-03 MED ORDER — LIDOCAINE HCL (PF) 2 % IJ SOLN
INTRAMUSCULAR | Status: AC
  Filled 2023-02-03: qty 5

## 2023-02-03 MED ORDER — ROCURONIUM BROMIDE 100 MG/10ML IV SOLN
INTRAVENOUS | Status: DC | PRN
  Administered 2023-02-03: 20 mg via INTRAVENOUS
  Administered 2023-02-03: 5 mg via INTRAVENOUS

## 2023-02-03 MED ORDER — CEFAZOLIN SODIUM-DEXTROSE 1-4 GM/50ML-% IV SOLN
INTRAVENOUS | Status: DC | PRN
  Administered 2023-02-03: 1 g via INTRAVENOUS

## 2023-02-03 MED ORDER — FENTANYL CITRATE (PF) 100 MCG/2ML IJ SOLN: 25.0000 ug | INTRAMUSCULAR | Status: DC | PRN

## 2023-02-03 MED ORDER — GELATIN ABSORBABLE 100 CM EX MISC
CUTANEOUS | Status: AC
  Filled 2023-02-03: qty 1

## 2023-02-03 MED ORDER — LIDOCAINE-EPINEPHRINE 1 %-1:100000 IJ SOLN
INTRAMUSCULAR | Status: AC
  Filled 2023-02-03: qty 1

## 2023-02-03 MED ORDER — ONDANSETRON HCL 4 MG/2ML IJ SOLN: 4.0000 mg | Freq: Once | INTRAMUSCULAR | Status: DC | PRN

## 2023-02-03 MED ORDER — PROPOFOL 10 MG/ML IV BOLUS
INTRAVENOUS | Status: DC | PRN
  Administered 2023-02-03: 30 mg via INTRAVENOUS

## 2023-02-03 MED ORDER — THROMBIN 5000 UNITS EX SOLR
CUTANEOUS | Status: AC
  Filled 2023-02-03: qty 5000

## 2023-02-03 MED ORDER — LIDOCAINE-EPINEPHRINE 1 %-1:100000 IJ SOLN
INTRAMUSCULAR | Status: DC | PRN
  Administered 2023-02-03: 3 mL

## 2023-02-03 MED ORDER — POLYETHYLENE GLYCOL 3350 17 G PO PACK: 17.0000 g | PACK | Freq: Every day | ORAL | Status: DC | PRN

## 2023-02-03 MED ORDER — PROPOFOL 10 MG/ML IV BOLUS
INTRAVENOUS | Status: AC
  Filled 2023-02-03: qty 20

## 2023-02-03 MED ORDER — ONDANSETRON HCL 4 MG/2ML IJ SOLN
INTRAMUSCULAR | Status: DC | PRN
  Administered 2023-02-03: 4 mg via INTRAVENOUS

## 2023-02-03 MED ORDER — SUCCINYLCHOLINE CHLORIDE 200 MG/10ML IV SOSY
PREFILLED_SYRINGE | INTRAVENOUS | Status: DC | PRN
  Administered 2023-02-03: 50 mg via INTRAVENOUS

## 2023-02-03 SURGICAL SUPPLY — 69 items
AGENT HMST KT MTR STRL THRMB (HEMOSTASIS)
APL PRP STRL LF ISPRP CHG 10.5 (MISCELLANEOUS)
APL SKNCLS STERI-STRIP NONHPOA (GAUZE/BANDAGES/DRESSINGS)
APPLICATOR CHLORAPREP 10.5 ORG (MISCELLANEOUS) ×1 IMPLANT
BENZOIN TINCTURE PRP APPL 2/3 (GAUZE/BANDAGES/DRESSINGS) IMPLANT
BLADE CLIPPER SPEC (BLADE) ×1 IMPLANT
BUR ACORN 7.5 PRECISION (BURR) ×1 IMPLANT
CATH ROBINSON RED A/P 10FR (CATHETERS) IMPLANT
COUNTER NEEDLE 20/40 LG (NEEDLE) ×1 IMPLANT
COVER LIGHT HANDLE STERIS (MISCELLANEOUS) ×2 IMPLANT
DRAPE SURG 17X11 SM STRL (DRAPES) ×4 IMPLANT
DRAPE WARM FLUID 44X44 (DRAPES) ×1 IMPLANT
DRSG TEGADERM 4X4.75 (GAUZE/BANDAGES/DRESSINGS) IMPLANT
DRSG TELFA 3X4 N-ADH STERILE (GAUZE/BANDAGES/DRESSINGS) ×1 IMPLANT
ELECT CAUTERY BLADE TIP 2.5 (TIP) ×1
ELECT REM PT RETURN 9FT ADLT (ELECTROSURGICAL) ×1
ELECTRODE CAUTERY BLDE TIP 2.5 (TIP) ×1 IMPLANT
ELECTRODE REM PT RTRN 9FT ADLT (ELECTROSURGICAL) ×1 IMPLANT
GAUZE 4X4 16PLY ~~LOC~~+RFID DBL (SPONGE) ×3 IMPLANT
GAUZE SPONGE 4X4 12PLY STRL (GAUZE/BANDAGES/DRESSINGS) ×4 IMPLANT
GAUZE XEROFORM 1X8 LF (GAUZE/BANDAGES/DRESSINGS) IMPLANT
GAUZE XEROFORM 5X9 LF (GAUZE/BANDAGES/DRESSINGS) IMPLANT
GLOVE SURG SYN 6.5 ES PF (GLOVE) ×2 IMPLANT
GLOVE SURG SYN 6.5 PF PI (GLOVE) ×1 IMPLANT
GLOVE SURG SYN 8.5  E (GLOVE) ×4
GLOVE SURG SYN 8.5 E (GLOVE) ×4 IMPLANT
GLOVE SURG SYN 8.5 PF PI (GLOVE) ×6 IMPLANT
GLOVE SURG UNDER POLY LF SZ6.5 (GLOVE) ×1 IMPLANT
GLOVE SURG UNDER POLY LF SZ8.5 (GLOVE) ×1 IMPLANT
GOWN SRG LRG LVL 4 IMPRV REINF (GOWNS) ×1 IMPLANT
GOWN SRG XL LVL 3 NONREINFORCE (GOWNS) ×1 IMPLANT
GOWN STRL NON-REIN TWL XL LVL3 (GOWNS) ×1
GOWN STRL REIN LRG LVL4 (GOWNS) ×1
GRADUATE 1200CC STRL 31836 (MISCELLANEOUS) ×1 IMPLANT
HOLDER FOLEY CATH W/STRAP (MISCELLANEOUS) IMPLANT
KIT DRAIN CSF ACCUDRAIN (MISCELLANEOUS) ×1 IMPLANT
KIT TURNOVER KIT A (KITS) ×1 IMPLANT
MANIFOLD NEPTUNE II (INSTRUMENTS) ×1 IMPLANT
MARKER SKIN DUAL TIP RULER LAB (MISCELLANEOUS) ×2 IMPLANT
MAT ABSORB  FLUID 56X50 GRAY (MISCELLANEOUS) ×1
MAT ABSORB FLUID 56X50 GRAY (MISCELLANEOUS) ×1 IMPLANT
NDL HYPO 25GX1X1/2 BEV (NEEDLE) ×1 IMPLANT
NEEDLE HYPO 25GX1X1/2 BEV (NEEDLE) ×1 IMPLANT
PACK LAMINECTOMY NEURO (CUSTOM PROCEDURE TRAY) ×1 IMPLANT
PAD ARMBOARD 7.5X6 YLW CONV (MISCELLANEOUS) ×2 IMPLANT
PAD PREP 24X41 OB/GYN DISP (PERSONAL CARE ITEMS) ×1 IMPLANT
PIN MAYFIELD SKULL DISP (PIN) IMPLANT
SCRUB TECHNI CARE 4 OZ NO DYE (MISCELLANEOUS) ×1 IMPLANT
SET CATH VENT DRAIN 3-15 1.9D (DRAIN) ×1 IMPLANT
SHEET NEURO XL SOL CTL (MISCELLANEOUS) ×1 IMPLANT
SOL PREP PVP 2OZ (MISCELLANEOUS) ×1
SOLUTION IRRIG SURGIPHOR (IV SOLUTION) ×1 IMPLANT
SOLUTION PREP PVP 2OZ (MISCELLANEOUS) ×1 IMPLANT
STAPLER SKIN PROX 35W (STAPLE) ×2 IMPLANT
STRIP CLOSURE SKIN 1/2X4 (GAUZE/BANDAGES/DRESSINGS) ×1 IMPLANT
SURGIFLO W/THROMBIN 8M KIT (HEMOSTASIS) IMPLANT
SURGILUBE 2OZ TUBE FLIPTOP (MISCELLANEOUS) IMPLANT
SUT MNCRL AB 3-0 PS2 27 (SUTURE) ×1 IMPLANT
SUT SILK 2 0 SH CR/8 (SUTURE) ×1 IMPLANT
SUT VIC AB 3-0 SH 8-18 (SUTURE) ×1 IMPLANT
SYR 10ML LL (SYRINGE) ×2 IMPLANT
TAPE CLOTH 3X10 WHT NS LF (GAUZE/BANDAGES/DRESSINGS) IMPLANT
TAPE HY-TAPE .5X5Y PINK LF (GAUZE/BANDAGES/DRESSINGS) ×1 IMPLANT
TOWEL OR 17X26 4PK STRL BLUE (TOWEL DISPOSABLE) ×3 IMPLANT
TRAP FLUID SMOKE EVACUATOR (MISCELLANEOUS) ×1 IMPLANT
TRAY FOLEY MTR SLVR 16FR STAT (SET/KITS/TRAYS/PACK) IMPLANT
TUBING ART PRESS 48 MALE/FEM (TUBING) IMPLANT
TUBING CONNECTING 10 (TUBING) ×1 IMPLANT
WATER STERILE IRR 1000ML POUR (IV SOLUTION) ×4 IMPLANT

## 2023-02-03 NOTE — H&P (View-Only) (Signed)
Consult requested by:  Dr. Jari Pigg  Consult requested for:  Subdural hematoma  Primary Physician:  Pleas Koch, NP  History of Present Illness: 02/03/2023   Amanda Porter is here today with a chief complaint of altered mental status.  She is well-known to me and is 5 months status post right ventriculoperitoneal shunt placement for normal pressure hydrocephalus.  She did well initially, but has struggled to achieve good control of her symptoms over the past month or 2.  She has had multiple urinary tract infections.  She her daughter reports that she has had increased confusion over the past couple of days and is hallucinating more often.  She has lost her ability to transfer.  She was walking a small number of steps but has lost that ability as well.   I have utilized the care everywhere function in epic to review the outside records available from external health systems.  Review of Systems:  A 10 point review of systems is negative, except for the pertinent positives and negatives detailed in the HPI.  Past Medical History: Past Medical History:  Diagnosis Date   Acute respiratory failure requiring reintubation ([place]) 11/05/2021   Anemia    Anxiety    Anxiety and depression    COPD (chronic obstructive pulmonary disease) (HCC)    CVA (cerebral vascular accident) ([place])    CVA (cerebral vascular accident) ([place])    L sided deficits   Dementia ([place])    Depression    Diabetes mellitus ([place])    Type 2   Diabetes mellitus without complication ([place])    Dysphagia    Dysphasia    Falls    GERD (gastroesophageal reflux disease)    HCAP (healthcare-associated pneumonia) 12/16/2022   History of lung cancer    Hyperlipidemia    Hypertension    Influenza A 12/13/2021   Memory disturbance 11/20/2017   Normal pressure hydrocephalus ([place]) 2018   Nose colonized with MRSA 08/25/2022   a.) noted on pre-surgical swab prior to VP shunt revision   Retinal detachment    Right    Right carotid bruit 11/20/2017   Tardive dyskinesia 02/01/2020   Tardive dyskinesia    Thrombosis    Arterial to lower extremity?    Past Surgical History: Past Surgical History:  Procedure Laterality Date   ESOPHAGOGASTRODUODENOSCOPY (EGD) WITH PROPOFOL N/A 12/16/2022   Procedure: ESOPHAGOGASTRODUODENOSCOPY (EGD) WITH PROPOFOL;  Surgeon: Lucilla Lame, MD;  Location: [facility];  Service: Endoscopy;  Laterality: N/A;   HIP ARTHROPLASTY Right 09/07/2022   Procedure: ARTHROPLASTY BIPOLAR HIP (HEMIARTHROPLASTY);  Surgeon: Lovell Sheehan, MD;  Location: [facility] OR[facility];  Service: Orthopedics;  Laterality: Right;   IR FL GUIDED LOC OF NEEDLE/CATH TIP FOR SPINAL INJECTION RT  07/25/2022   LUNG REMOVAL, PARTIAL     left upper lobe   SHUNT REVISION VENTRICULAR-PERITONEAL N/A 09/19/2022   Procedure: VENTRICULOPERITONEAL SHUNT REVISION;  Surgeon: Meade Maw, MD;  Location: [facility] OR[facility];  Service: Neurosurgery;  Laterality: N/A;   VENTRICULOPERITONEAL SHUNT  12/09/2016   VENTRICULOPERITONEAL SHUNT  10/2018    Allergies: Allergies as of 02/03/2023 - Review Complete 02/03/2023  Allergen Reaction Noted   Lexapro [escitalopram oxalate] Other (See Comments) 09/23/2017   Neosporin  [neomycin-polymyxin-gramicidin] Rash    Neosporin [bacitracin-polymyxin b] Rash 11/04/2021    Medications: Current Meds  Medication Sig   busPIRone (BUSPAR) 30 MG tablet Take 30 mg by mouth 2 (two) times daily.   PROLIA 60 MG/ML SOSY injection Inject 60 mg into the  skin every 6 (six) months.    Social History: Social History   Tobacco Use   Smoking status: Former    Packs/day: 0.50    Years: 50.00    Total pack years: 25.00    Types: Cigarettes    Quit date: 08/01/2017    Years since quitting: 5.5    Passive exposure: Past   Smokeless tobacco: Never  Vaping Use   Vaping Use: Never used  Substance Use Topics   Alcohol use: Not Currently   Drug use: Not Currently    Family Medical History: Family  History  Problem Relation Age of Onset   Pneumonia Mother    Cancer Father    Hyperlipidemia Sister    Hypertension Sister    Heart disease Sister    Diabetes Sister    Hypertension Sister    Diabetes Sister    Pancreatic cancer Sister    Throat cancer Brother    Cancer Brother    Cancer Brother    Breast cancer Neg Hx     Physical Examination: Vitals:   02/03/23 1912 02/03/23 1915  BP: (!) 101/57   Pulse: 90   Resp: 18   Temp:  98.3 F (36.8 C)  SpO2: 100%     General: Patient is laying in bed.  She is in no acute distress.  Respiratory: Patient is breathing without any difficulty.   NEUROLOGICAL:     OE to voice, regards, follows some commands.  Oriented to self and "[facility]" and "2014."  Cranial Nerves: Pupils equal round and reactive to light.  Facial tone is symmetric.  Facial sensation is symmetric. Shoulder shrug is symmetric.    Strength: She cannot lift her L arm off bed.  She cannot lift her L leg.  Sensory exam unreliable.   Gait is untested.     Medical Decision Making  Imaging: CT Head 02/03/2023 IMPRESSION: 1. Mixed density right cerebral convexity subdural hematoma is significantly increased in size since 01/12/2023, currently measuring up to 2.4 cm with worsened mass effect with 1.3 cm rightward midline shift. 2. Stable position of the right frontal ventricular catheter without evidence of hydrocephalus or trapped ventricle 3. No acute fracture or traumatic malalignment of the cervical spine.   Critical Value/emergent results were called by telephone at the time of interpretation on 02/03/2023 at 7:55 pm to provider [facility] , who verbally acknowledged these results.     Electronically Signed   By: Valetta Mole M.D.   On: 02/03/2023 19:58  I have personally reviewed the images and agree with the above interpretation.  Assessment and Plan:   Amanda Porter is a pleasant 77 y.o. adult with history of normal pressure  hydrocephalus who underwent ventriculoperitoneal shunt placement 5 months ago.  She presents today with a subdural hematoma on the right side after trauma approximately a month ago.  This is causing considerable compression of her brain.  This could be related to shunt over drainage or worsening of the traumatic subdural hematoma.  This does represent a shunt failure, as the over drainage has caused her severe symptoms.  I recommended removal of her shunt and placement of a subdural drain.  The patient does not have capacity for medical decisions, so her daughter has given consent.  I discussed the planned procedure at length with the patient's daughter, including the risks, benefits, alternatives, and indications. The risks discussed include but are not limited to bleeding, infection, need for reoperation, spinal fluid leak, stroke, vision loss,  anesthetic complication, coma, paralysis, and even death. I also described in detail that improvement was not guaranteed.  The patient's daughter expressed understanding of these risks, and asked that we proceed with surgery. I described the surgery in layman's terms, and gave ample opportunity for questions, which were answered to the best of my ability.  Given her general health, I have asked the intensive care team to admit the patient and provide comanagement.  I have communicated my recommendations to the requesting physician and coordinated care to facilitate these recommendations.     Zilphia Kozinski K. [name] MD, [facility] Neurosurgery

## 2023-02-03 NOTE — Transfer of Care (Signed)
Immediate Anesthesia Transfer of Care Note  Patient: Amanda Porter  Procedure(s) Performed: BURR HOLE X 1, ventriculoperitoneal shunt removal (Right)  Patient Location: PACU  Anesthesia Type:General  Level of Consciousness: awake, drowsy, and patient cooperative  Airway & Oxygen Therapy: Patient Spontanous Breathing and Patient connected to nasal cannula oxygen  Post-op Assessment: Report given to RN and Post -op Vital signs reviewed and stable  Post vital signs: Reviewed and stable  Last Vitals:  Vitals Value Taken Time  BP 127/71 02/03/23 2315  Temp 36 C 02/03/23 2312  Pulse 76 02/03/23 2317  Resp 15 02/03/23 2317  SpO2 99 % 02/03/23 2317  Vitals shown include unvalidated device data.  Last Pain:  Vitals:   02/03/23 2117  TempSrc: Oral  PainSc:          Complications: No notable events documented.

## 2023-02-03 NOTE — ED Triage Notes (Signed)
BIB POV from home by daughter. Daughter reports concern for AMS. Pt has hydrocephalus and dementia at baseline but daughter reports concern for worsening AMS. States pt unable to follow commands and at times feels uncoordinated in her L arm but that pt has a L frozen shoulder and decreased ROM at baseline. Daughter reports recent treatment for UTI with completion of 2 courses of abx. Reports continued increased frequency/urgency but pt does not void without catheterization at home. Reports Urology states urine now clear and that that provider feels pt may be experiencing delirium from abx. Daughter also reports that pt experienced a brain bleed 2 weeks ago following multiple falls. Daughter also endorses that pt is having trouble with transfers, where she did not have difficulty before. Pt alert and following commands. Pt does not appear to have any loss of sensation in triage, though it is difficult to get pt to comprehend question.

## 2023-02-03 NOTE — ED Provider Notes (Signed)
[facility] Provider Note    Event Date/Time   First MD Initiated Contact with Patient 02/03/23 1933     (approximate)   History   Altered Mental Status   HPI  Amanda Porter is a 77 y.o. adult who comes in with concern for altered mental status.  Patient has a history of hydrocephalus and dementia at baseline but reports worsening altered mental status she is unable to follow commands and uncoordinated in her left arm patient has had some recent treatments for UTIs with 2 courses of different antibiotics.  Is also had prior brain bleed secondary to falls.   Daughter reports that she has had acute change over the past 24 to 48 hours where she seems more altered and confused.  She seems to be having more hallucinations.  She is typically able to transfer and she is often able to do that.  She goes to an adult daycare but unfortunately she seemed to search declined that they stated that they were concerned so that also was going on and recommended she come into the ER to be evaluated.  She did last have her hydrocephalus and VP shunt evaluation and fixed by Dr. [name] a few months ago and reportedly no issues with that.  They actually a follow-up with him later this month.  But they wanted to make sure there is no issues with it.  Physical Exam   Triage Vital Signs: ED Triage Vitals  Enc Vitals Group     BP 02/03/23 1912 (!) 101/57     Pulse Rate 02/03/23 1912 90     Resp 02/03/23 1912 18     Temp 02/03/23 1915 98.3 F (36.8 C)     Temp Source 02/03/23 1915 Oral     SpO2 02/03/23 1912 100 %     Weight 02/03/23 1913 98 lb (44.5 kg)     Height 02/03/23 1913 '5\' 2"'$  (1.575 m)     Head Circumference --      Peak Flow --      Pain Score 02/03/23 1913 0     Pain Loc --      Pain Edu? --      Excl. in [place]? --     Most recent vital signs: Vitals:   02/03/23 1912 02/03/23 1915  BP: (!) 101/57   Pulse: 90   Resp: 18   Temp:  98.3 F (36.8 C)  SpO2: 100%       General: Awake, no distress.  CV:  Good peripheral perfusion.  Resp:  Normal effort.  Abd:  No distention.  Other:  Frozen left shoulder-patient is moving all extremities although does have some confusion.  She has difficulty moving the left arm but that seems like baseline from a prior frozen shoulder although the daughter reports that seems to be worse in the left wrist.   ED Results / Procedures / Treatments   Labs (all labs ordered are listed, but only abnormal results are displayed) Labs Reviewed  COMPREHENSIVE METABOLIC PANEL  CBC  CBG MONITORING, ED     EKG  My interpretation of EKG:   [place] rate of 83 without any ST elevation or T wave inversions, normal intervals  RADIOLOGY I have reviewed the CT had personally interpreted patient has subdural  PROCEDURES:  Critical Care performed: Yes, see critical care procedure note(s)  .1-3 Lead EKG Interpretation  Performed by: Vanessa Humble, MD Authorized by: Vanessa Huerfano, MD  Interpretation: normal     ECG rate:  90   ECG rate assessment: normal     Rhythm: sinus rhythm     Ectopy: none     Conduction: normal   .Critical Care  Performed by: Vanessa [place], MD Authorized by: Vanessa [place], MD   Critical care provider statement:    Critical care time (minutes):  30   Critical care was necessary to treat or prevent imminent or life-threatening deterioration of the following conditions:  CNS failure or compromise   Critical care was time spent personally by me on the following activities:  Development of treatment plan with patient or surrogate, discussions with consultants, evaluation of patient's response to treatment, examination of patient, ordering and review of laboratory studies, ordering and review of radiographic studies, ordering and performing treatments and interventions, pulse oximetry, re-evaluation of patient's condition and review of old charts    [place] ED: Medications - No  data to display   IMPRESSION / MDM / [place] / ED COURSE  I reviewed the triage vital signs and the nursing notes.   Patient's presentation is most consistent with acute presentation with potential threat to life or bodily function.   Patient comes in with worsening mental status.  Differentials intracranial hemorrhage, UTI, Electra abnormalities.  CBC shows stable hemoglobin glucose normal CMP normal CT scan does show worsening subdural hematoma.  Discussed the case with Dr. [name].  Recommend surgical drainage.  Discussed with family they would like to do this.  I did discuss with ICU team today where patient becoming to the postop.  8:48 PM patient currently protecting airway no indication for emergent intubation at this time  The patient is on the cardiac monitor to evaluate for evidence of arrhythmia and/or significant heart rate changes.      FINAL CLINICAL IMPRESSION(S) / ED DIAGNOSES   Final diagnoses:  Subdural hematoma (HCC)  Altered mental status, unspecified altered mental status type     Rx / DC Orders   ED Discharge Orders     None        Note:  This document was prepared using Dragon voice recognition software and may include unintentional dictation errors.   Vanessa [place], MD 02/03/23 [phone]

## 2023-02-03 NOTE — Op Note (Signed)
Indications: Amanda Porter is suffering from a chronic subdural hematoma causing significant brain compression and symptoms, prompting surgical intervention.  She has also suffered from shunt overdrainage.  Findings: subdural hematoma  Preoperative Diagnosis: Subdural hematoma, shunt malfunction Postoperative Diagnosis: same   EBL: 10 ml IVF: see AR ml Drains: subdural drain placed Disposition: Extubated and Stable to PACU Complications: none  A foley catheter was placed.   Preoperative Note:   Risks of surgery discussed include: infection, bleeding, stroke, coma, death, paralysis, CSF leak, weakness, need for further surgery, persistent symptoms, and the risks of anesthesia. The patient's daughter understood these risks and agreed to proceed.  Operative Note:   Procedure:  1) Right frontal burr hole for drainage of subdural hematoma   Procedure: After obtaining informed consent, the patient taken to the operating room, placed in supine position, general anesthesia induced.  The head was placed on a horseshoe.  The prior cranial incision was identified.  The hair was clipped.  The operative site was prepped and draped.  A timeout was performed.    The incision was injected with local.  A curvilinear incision was made and carried to the skull.  The pericranium was reflected laterally and a small self-retaining retractor placed.  The prior shunt was identified, dissected free, then removed in its entirety.   Then, the drill was used to make a burr hole just posterior to the burr hole site.  The dura was coagulated, then divided with a #15 blade.  A membrane was encountered and opened.    Dark brown subdural fluid was encountered.  A bactiseal ventriculostomy catheter was placed into the subdural space, then tunneled medially and posteriorly approximately 5 cm from the incision.  The connector was placed and secured.  Brisk drainage was noted.  The primary incision was irrigated,  then closed with vicryl and staples.  The drain was secured to the skin.  The drainage system was flushed, then attached to the drain.  Good drainage was noted.    Sterile dressings were placed.  Sponge and pattie counts were correct at the end of the procedure.   I performed the entire procedure.   Meade Maw MD

## 2023-02-03 NOTE — Interval H&P Note (Signed)
History and Physical Interval Note:  02/03/2023 9:34 PM  Amanda Porter  has presented today for surgery, with the diagnosis of subdural hematoma.  The various methods of treatment have been discussed with the patient and family. After consideration of risks, benefits and other options for treatment, the patient has consented to  Procedure(s): BURR HOLES (Right) as a surgical intervention.  The patient's history has been reviewed, patient examined, no change in status, stable for surgery.  I have reviewed the patient's chart and labs.    Heart sounds normal no MRG. Chest Clear to Auscultation Bilaterally.   Beya Tipps

## 2023-02-03 NOTE — Consult Note (Addendum)
Consult requested by:  Dr. Jari Pigg  Consult requested for:  Subdural hematoma  Primary Physician:  Pleas Koch, NP  History of Present Illness: 02/03/2023   Amanda Porter is here today with a chief complaint of altered mental status.  She is well-known to me and is 5 months status post right ventriculoperitoneal shunt replacement for normal pressure hydrocephalus.  She did well initially, but has struggled to achieve good control of her symptoms over the past month or 2.  She has had multiple urinary tract infections.  She her daughter reports that she has had increased confusion over the past couple of days and is hallucinating more often.  She has lost her ability to transfer.  She was walking a small number of steps but has lost that ability as well.   I have utilized the care everywhere function in epic to review the outside records available from external health systems.  Review of Systems:  A 10 point review of systems is negative, except for the pertinent positives and negatives detailed in the HPI.  Past Medical History: Past Medical History:  Diagnosis Date   Acute respiratory failure requiring reintubation ([place]) 11/05/2021   Anemia    Anxiety    Anxiety and depression    COPD (chronic obstructive pulmonary disease) (HCC)    CVA (cerebral vascular accident) ([place])    CVA (cerebral vascular accident) ([place])    L sided deficits   Dementia ([place])    Depression    Diabetes mellitus ([place])    Type 2   Diabetes mellitus without complication ([place])    Dysphagia    Dysphasia    Falls    GERD (gastroesophageal reflux disease)    HCAP (healthcare-associated pneumonia) 12/16/2022   History of lung cancer    Hyperlipidemia    Hypertension    Influenza A 12/13/2021   Memory disturbance 11/20/2017   Normal pressure hydrocephalus ([place]) 2018   Nose colonized with MRSA 08/25/2022   a.) noted on pre-surgical swab prior to VP shunt revision   Retinal detachment    Right    Right carotid bruit 11/20/2017   Tardive dyskinesia 02/01/2020   Tardive dyskinesia    Thrombosis    Arterial to lower extremity?    Past Surgical History: Past Surgical History:  Procedure Laterality Date   ESOPHAGOGASTRODUODENOSCOPY (EGD) WITH PROPOFOL N/A 12/16/2022   Procedure: ESOPHAGOGASTRODUODENOSCOPY (EGD) WITH PROPOFOL;  Surgeon: Lucilla Lame, MD;  Location: [facility] ENDOSCOPY;  Service: Endoscopy;  Laterality: N/A;   HIP ARTHROPLASTY Right 09/07/2022   Procedure: ARTHROPLASTY BIPOLAR HIP (HEMIARTHROPLASTY);  Surgeon: Lovell Sheehan, MD;  Location: [facility] ORS;  Service: Orthopedics;  Laterality: Right;   IR FL GUIDED LOC OF NEEDLE/CATH TIP FOR SPINAL INJECTION RT  07/25/2022   LUNG REMOVAL, PARTIAL     left upper lobe   SHUNT REVISION VENTRICULAR-PERITONEAL N/A 09/19/2022   Procedure: VENTRICULOPERITONEAL SHUNT REVISION;  Surgeon: Meade Maw, MD;  Location: [facility] ORS;  Service: Neurosurgery;  Laterality: N/A;   VENTRICULOPERITONEAL SHUNT  12/09/2016   VENTRICULOPERITONEAL SHUNT  10/2018    Allergies: Allergies as of 02/03/2023 - Review Complete 02/03/2023  Allergen Reaction Noted   Lexapro [escitalopram oxalate] Other (See Comments) 09/23/2017   Neosporin  [neomycin-polymyxin-gramicidin] Rash    Neosporin [bacitracin-polymyxin b] Rash 11/04/2021    Medications: Current Meds  Medication Sig   busPIRone (BUSPAR) 30 MG tablet Take 30 mg by mouth 2 (two) times daily.   PROLIA 60 MG/ML SOSY injection Inject 60 mg into the  skin every 6 (six) months.    Social History: Social History   Tobacco Use   Smoking status: Former    Packs/day: 0.50    Years: 50.00    Total pack years: 25.00    Types: Cigarettes    Quit date: 08/01/2017    Years since quitting: 5.5    Passive exposure: Past   Smokeless tobacco: Never  Vaping Use   Vaping Use: Never used  Substance Use Topics   Alcohol use: Not Currently   Drug use: Not Currently    Family Medical History: Family  History  Problem Relation Age of Onset   Pneumonia Mother    Cancer Father    Hyperlipidemia Sister    Hypertension Sister    Heart disease Sister    Diabetes Sister    Hypertension Sister    Diabetes Sister    Pancreatic cancer Sister    Throat cancer Brother    Cancer Brother    Cancer Brother    Breast cancer Neg Hx     Physical Examination: Vitals:   02/03/23 1912 02/03/23 1915  BP: (!) 101/57   Pulse: 90   Resp: 18   Temp:  98.3 F (36.8 C)  SpO2: 100%     General: Patient is laying in bed.  She is in no acute distress.  Respiratory: Patient is breathing without any difficulty.   NEUROLOGICAL:     OE to voice, regards, follows some commands.  Oriented to self and "[facility]" and "2014."  Cranial Nerves: Pupils equal round and reactive to light.  Facial tone is symmetric.  Facial sensation is symmetric. Shoulder shrug is symmetric.    Strength: She cannot lift her L arm off bed.  She cannot lift her L leg.  Sensory exam unreliable.   Gait is untested.     Medical Decision Making  Imaging: CT Head 02/03/2023 IMPRESSION: 1. Mixed density right cerebral convexity subdural hematoma is significantly increased in size since 01/12/2023, currently measuring up to 2.4 cm with worsened mass effect with 1.3 cm rightward midline shift. 2. Stable position of the right frontal ventricular catheter without evidence of hydrocephalus or trapped ventricle 3. No acute fracture or traumatic malalignment of the cervical spine.   Critical Value/emergent results were called by telephone at the time of interpretation on 02/03/2023 at 7:55 pm to provider [facility] , who verbally acknowledged these results.     Electronically Signed   By: Valetta Mole M.D.   On: 02/03/2023 19:58  I have personally reviewed the images and agree with the above interpretation.  Assessment and Plan:   Amanda Porter is a pleasant 77 y.o. adult with history of normal pressure  hydrocephalus who underwent ventriculoperitoneal shunt placement 5 months ago.  She presents today with a subdural hematoma on the right side after trauma approximately a month ago.  This is causing considerable compression of her brain.  This could be related to shunt over drainage or worsening of the traumatic subdural hematoma.  This does represent a shunt failure, as the over drainage has caused her severe symptoms.  I recommended removal of her shunt and placement of a subdural drain.  The patient does not have capacity for medical decisions, so her daughter has given consent.  I discussed the planned procedure at length with the patient's daughter, including the risks, benefits, alternatives, and indications. The risks discussed include but are not limited to bleeding, infection, need for reoperation, spinal fluid leak, stroke, vision loss,  anesthetic complication, coma, paralysis, and even death. I also described in detail that improvement was not guaranteed.  The patient's daughter expressed understanding of these risks, and asked that we proceed with surgery. I described the surgery in layman's terms, and gave ample opportunity for questions, which were answered to the best of my ability.  Given her general health, I have asked the intensive care team to admit the patient and provide comanagement.  I have communicated my recommendations to the requesting physician and coordinated care to facilitate these recommendations.     Sevana Grandinetti K. [name] MD, [facility] Neurosurgery

## 2023-02-03 NOTE — H&P (Signed)
NAME:  Amanda Porter, MRN:  [id number], DOB:  Mar 02, 1946, LOS: 0 ADMISSION DATE:  02/03/2023, CONSULTATION DATE: 02/03/2023 REFERRING MD: Dr. Jari Pigg, CHIEF COMPLAINT: Altered mental status  History of Present Illness:  77 year old female presenting to [facility] ED on 02/03/2023 due to concerns for altered mental status.  History provided per chart review and by her daughter bedside, who is a paramedic and is the patient's caretaker. The patient had fallen on February 10 at rehab, and was found to have a subdural hematoma.  Repeat imaging had showed it to be stable on February 12.  Patient also recently being treated for UTI.  At home daughter noticed over the last few weeks patient had increased fatigue.  And over the last few days she had decreased mobility of her left hand.  Starting on 02/03/2023 the patient became more altered, with her daughter describing some hallucinations and inability to comprehend instructions which is not her baseline.  She has also noticed some increased slurred speech as well as word finding difficulties. Daughter denies any recent fever or chills, no chest pain/shortness of breath, abdominal pain/nausea/vomiting/diarrhea.  The patient has not been having any pain with urination but has had increased frequency/urgency in the setting of her recent urinary tract infection.  ED course: Upon arrival patient alert and oriented x 1-2 & stable vital signs.  She is having difficulty following instructions.  Lab work largely reassuring with mild hyperglycemia and mild anemia that appears to be at baseline. Imaging significant for worsening subdural hematoma and case discussed with neurosurgery on-call.  Patient is well-known to them and the decision was made to take the patient urgently to the OR for drain placement and VP shunt removal.  PCCM consulted for admission.  Sounds a initial Vitals: 98.3, 18, 90, 101/57 and 100% on room air Significant labs: (Labs/ Imaging personally reviewed) I,  Domingo Pulse Rust[place]ster, AGACNP-BC, personally viewed and interpreted this ECG. EKG Interpretation: Date: 02/03/2023, EKG Time: 20:39, Rate: 83, Rhythm: NSR, QRS Axis: Normal, Intervals: Borderline prolonged QTc, ST/T Wave abnormalities: None, Narrative Interpretation: NSR Chemistry: Na+: 136, K+: 4.2, BUN/Cr.:  20/0.72, Serum CO2/ AG: 25/10 Hematology: WBC: 7.7, Hgb: 8.9,  Troponin: 6, COVID-19 & Influenza A/B: Negative  CXR 02/03/2023: Intact shunt catheter.  Mild bibasilar atelectasis KUB 02/03/2023: Shunt catheter is noted to be intact DG cervical spine 02/03/2023: Stable appearing right shunt catheter DG skull 1-3 views 02/03/2023: Unremarkable shunt tubing over the head CT head without contrast 02/03/2023: Mixed density right cerebral convexity subdural hematoma is significantly increased in size since 01/12/2023, currently measuring up to 2.4 cm with worsened mass effect with 1.3 cm rightward midline shift.  Stable position of right frontal ventricular catheter without evidence of hydrocephalus or trapped ventricle. CT cervical spine without contrast 02/03/2023: No acute fracture or traumatic malalignment of the cervical spine  PCCM consulted for admission due to worsening subdural hematoma requiring urgent neurosurgical drain placement and VP shunt removal.  Pertinent  Medical History  Normal pressure hydrocephalus status post VP shunt (2018 with multiple revisions) Dementia CVA with left-sided hemiparesis Frequent UTIs Type 2 diabetes mellitus Falls Hyperlipidemia Hypertension Lung cancer status post upper left lobectomy Tardive dyskinesia (2021) Depression and anxiety DVT  COPD chronic 3 L [place] Former smoker (50+ years)  Significant Hospital Events: Including procedures, antibiotic start and stop dates in addition to other pertinent events   02/03/2023: Admit to ICU due to worsening subdural hematoma with mass effect requiring urgent neurosurgical drain placement and VP shunt  removal  Interim History / Subjective:  Patient alert and oriented x 1-2, vital signs stable and the patient is protecting her airway.  Neurosurgery in room discussing procedure with patient's daughter. Plan of care discussed in detail, all questions and concerns answered at this time  Objective   Blood pressure (!) 126/59, pulse 83, temperature 98.7 F (37.1 C), temperature source Oral, resp. rate 20, height '5\' 2"'$  (1.575 m), weight 44.5 kg, SpO2 100 %.       No intake or output data in the 24 hours ending 02/03/23 2201 Filed Weights   02/03/23 1913  Weight: 44.5 kg    Examination: General: Adult female, critically ill, lying in bed, NAD HEENT: MM pink/moist, anicteric, atraumatic, neck supple Neuro: A&O x 1-2, difficulty following commands, PERRL +3,  CV: s1s2 RRR, NSR on monitor, no r/m/g Pulm: Regular, non labored on chronic 3 L [place], breath sounds clear-BUL & diminished-BLL GI: soft, rounded, non tender, bs x 4 Skin:  no rashes/lesions noted Extremities: warm/dry, pulses + 2 R/P, no edema noted  Resolved Hospital Problem list     Assessment & Plan:  Worsening Subdural Hematoma with mass effect and rightward shift requiring emergent drain placement and VP shunt removal - Neurosurgery consulted, appreciate input- recs below - Q 2 h neuro checks x 12 - f/u CTH at 6 am - SCD's for VTE prophylaxis - strict bedrest - falls precautions - avoid sedating medications as tolerated  Chronic COPD without exacerbation PMHx: Lung Cancer s/p upper Left Lobectomy, former smoker - Supplemental oxygen as needed, maintain SpO2 > 88% - Continue outpatient regimen > might need to adjust depending on the patient's ability to comprehend instructions: aformoterol & umeclidinium bromide  - Bronchodilators as needed  Acute Encephalopathy secondary to worsening subdural hematoma in the setting of baseline dementia Anxiety & Depression - restart outpatient memantine & melatonin on 3/6 - hold  Buspar & klonopin for now to avoid sedating the patient overnight  Hypertension Hyperlipidemia - restart outpatient Crestor - maintain normo-tension, consider IV labetalol PRN for SBP > 145 - consider restarting losartan as the patient stabilizes  Type 2 Diabetes Mellitus Steroid Induced Hyperglycemia Hemoglobin A1C: 6.8 - Monitor CBG Q 4 hours - SSI sensitive dosing - target range while in ICU: 140-180 - follow ICU hyper/hypo-glycemia protocol  Best Practice (right click and "Reselect all SmartList Selections" daily)  Diet/type: NPO w/ oral meds DVT prophylaxis: SCD GI prophylaxis: PPI Lines: N/A Foley:  Yes, and it is still needed Code Status:  limited > daughter confirmed that the patient is amenable to being intubated with mechanical ventilation for procedures but not for end of life resuscitation Last date of multidisciplinary goals of care discussion [02/03/23]  Labs   CBC: Recent Labs  Lab 02/03/23 1929  WBC 7.7  HGB 8.9*  HCT 30.0*  MCV 88.5  PLT 411*    Basic Metabolic Panel: Recent Labs  Lab 02/03/23 1929  NA 136  K 4.2  CL 101  CO2 25  GLUCOSE 150*  BUN 20  CREATININE 0.72  CALCIUM 9.1   GFR: Estimated Creatinine Clearance (by C-G formula based on SCr of 0.72 mg/dL) Female: 41.4 mL/min Female: 48.7 mL/min Recent Labs  Lab 02/03/23 1929  WBC 7.7    Liver Function Tests: Recent Labs  Lab 02/03/23 1929  AST 28  ALT 15  ALKPHOS 63  BILITOT 0.4  PROT 7.9  ALBUMIN 3.5   No results for input(s): "LIPASE", "AMYLASE" in the last 168 hours. No results for  input(s): "AMMONIA" in the last 168 hours.  ABG    Component Value Date/Time   PHART 7.302 (L) 11/04/2021 2221   PCO2ART 55.7 (H) 11/04/2021 2221   PO2ART 530 (H) 11/04/2021 2221   HCO3 34.6 (H) 11/10/2022 1611   TCO2 29 11/04/2021 2221   O2SAT 72.5 11/10/2022 1611     Coagulation Profile: No results for input(s): "INR", "PROTIME" in the last 168 hours.  Cardiac Enzymes: No  results for input(s): "CKTOTAL", "CKMB", "CKMBINDEX", "TROPONINI" in the last 168 hours.  HbA1C: Hemoglobin A1C  Date/Time Value Ref Range Status  01/22/2023 01:04 PM 6.8 (A) 4.0 - 5.6 % Final   Hgb A1c MFr Bld  Date/Time Value Ref Range Status  04/04/2022 10:25 AM 7.6 (H) 4.6 - 6.5 % Final    Comment:    Glycemic Control Guidelines for People with Diabetes:Non Diabetic:  <6%Goal of Therapy: <7%Additional Action Suggested:  >8%   11/09/2021 01:50 AM 7.1 (H) 4.8 - 5.6 % Final    Comment:    (NOTE) Pre diabetes:          5.7%-6.4%  Diabetes:              >6.4%  Glycemic control for   <7.0% adults with diabetes     CBG: Recent Labs  Lab 02/03/23 2029  GLUCAP 132*    Review of Systems: Positives in BOLD  Patient is a poor historian Gen: Denies fever, chills, weight change, fatigue, night sweats HEENT: Denies blurred vision, double vision, hearing loss, tinnitus, sinus congestion, rhinorrhea, sore throat, neck stiffness, dysphagia PULM: Denies shortness of breath, cough, sputum production, hemoptysis, wheezing CV: Denies chest pain, edema, orthopnea, paroxysmal nocturnal dyspnea, palpitations GI: Denies abdominal pain, nausea, vomiting, diarrhea, hematochezia, melena, constipation, change in bowel habits GU: Denies dysuria, hematuria, polyuria, oliguria, urethral discharge Endocrine: Denies hot or cold intolerance, polyuria, polyphagia or appetite change Derm: Denies rash, dry skin, scaling or peeling skin change Heme: Denies easy bruising, bleeding, bleeding gums Neuro: Denies headache, numbness, weakness, word finding difficulty, slurred speech, loss of memory or consciousness  Past Medical History:  Halo A Golinski "[name]",  has a past medical history of Acute respiratory failure requiring reintubation ([place]) (11/05/2021), Anemia, Anxiety, Anxiety and depression, COPD (chronic obstructive pulmonary disease) ([place]), CVA (cerebral vascular accident) ([place]), CVA (cerebral vascular  accident) ([place]), Dementia ([place]), Depression, Diabetes mellitus ([place]), Diabetes mellitus without complication ([place]), Dysphagia, Dysphasia, Falls, GERD (gastroesophageal reflux disease), HCAP (healthcare-associated pneumonia) (12/16/2022), History of lung cancer, Hyperlipidemia, Hypertension, Influenza A (12/13/2021), Memory disturbance (11/20/2017), Normal pressure hydrocephalus ([place]) (2018), 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   Surgical History:   Past Surgical History:  Procedure Laterality Date   ESOPHAGOGASTRODUODENOSCOPY (EGD) WITH PROPOFOL N/A 12/16/2022   Procedure: ESOPHAGOGASTRODUODENOSCOPY (EGD) WITH PROPOFOL;  Surgeon: Lucilla Lame, MD;  Location: [facility] ENDOSCOPY;  Service: Endoscopy;  Laterality: N/A;   HIP ARTHROPLASTY Right 09/07/2022   Procedure: ARTHROPLASTY BIPOLAR HIP (HEMIARTHROPLASTY);  Surgeon: Lovell Sheehan, MD;  Location: [facility] ORS;  Service: Orthopedics;  Laterality: Right;   IR FL GUIDED LOC OF NEEDLE/CATH TIP FOR SPINAL INJECTION RT  07/25/2022   LUNG REMOVAL, PARTIAL     left upper lobe   SHUNT REVISION VENTRICULAR-PERITONEAL N/A 09/19/2022   Procedure: VENTRICULOPERITONEAL SHUNT REVISION;  Surgeon: Meade Maw, MD;  Location: [facility] ORS;  Service: Neurosurgery;  Laterality: N/A;   VENTRICULOPERITONEAL SHUNT  12/09/2016   VENTRICULOPERITONEAL SHUNT  10/2018  Social History:   reports that Stamps. Friske "[name]" quit smoking about 5 years ago. Josclyn A. Cammon "[name]"'s smoking use included cigarettes. Lincy A. Minnis "Marlou Sa" has a 25.00 pack-year smoking history. Cheyane A. Artola "Marlou Sa" has been exposed to tobacco smoke. Tineka A. Ensey "Marlou Sa" has never used smokeless tobacco. Oretha A. Adkins "Marlou Sa" reports that Abrianna A. Tamura "Marlou Sa" does not currently use alcohol. Anamae A. Tatsch "Marlou Sa" reports that Tiaunna A. Milledge "Marlou Sa" does not currently use drugs.    Family History:  Edyn A Rietz "[name]"'s family history includes Cancer in [place] A. Myhand "[name]"'s brother, brother, and father; Diabetes in [place]. [place] "[name]"'s sister and sister; Heart disease in [place] A. Barocio "[name]"'s sister; Hyperlipidemia in [place] A. Reisig "[name]"'s sister; Hypertension in [place] A. Lapoint "[name]"'s sister and sister; Pancreatic cancer in [place] A. Evers "[name]"'s sister; Pneumonia in [place] A. Honeywell "[name]"'s mother; Throat cancer in [place] A. Tunison "[name]"'s brother. There is no history of Breast cancer.   Allergies Allergies  Allergen Reactions   Lexapro [Escitalopram Oxalate] Other (See Comments)    Dizziness, nausea, fatigue   Neosporin  [Neomycin-Polymyxin-Gramicidin] Rash    (Neosporin)   Neosporin [Bacitracin-Polymyxin B] Rash     Home Medications  Prior to Admission medications   Medication Sig Start Date End Date Taking? Authorizing Provider  busPIRone (BUSPAR) 30 MG tablet Take 30 mg by mouth 2 (two) times daily. 02/03/23  Yes [provider]  PROLIA 60 MG/ML SOSY injection Inject 60 mg into the skin every 6 (six) months. 01/27/23  Yes [provider]  acetaminophen (TYLENOL) 325 MG tablet Take 650 mg by mouth every 4 (four) hours as needed for mild pain.    [provider]  albuterol (VENTOLIN HFA) 108 (90 Base) MCG/ACT inhaler INHALE 2 PUFFS BY MOUTH EVERY 4 HOURS AS NEEDED 07/11/22   Baird Lyons D, MD  Albuterol Sulfate 2.5 MG/0.5ML NEBU Inhale 2.5 mg into the lungs every 6 (six) hours as needed (shortness of breath).    [provider]  amLODipine (NORVASC) 2.5 MG tablet Take 1 tablet (2.5 mg total) by mouth daily. for blood pressure. 08/19/22   Pleas Koch, NP  ascorbic acid (VITAMIN C) 500 MG tablet Take 1 tablet (500 mg total) by mouth daily. 12/19/22 03/19/23  Val Riles, MD  aspirin 81 MG chewable tablet Chew 1 tablet (81 mg total) by mouth daily. Hold for now and resume on 12/22/22 12/22/22   Val Riles, MD   buPROPion (WELLBUTRIN XL) 300 MG 24 hr tablet Take 1 tablet (300 mg total) by mouth daily with breakfast. for anxiety and depression. 11/10/22   Pleas Koch, NP  busPIRone (BUSPAR) 15 MG tablet Take 30 mg by mouth 2 (two) times daily.    [provider]  Calcium Carb-Cholecalciferol (CALCIUM/VITAMIN D PO) Take 1 tablet by mouth daily.    [provider]  clonazePAM (KLONOPIN) 1 MG tablet Take 1 mg by mouth at bedtime.    [provider]  conjugated estrogens (PREMARIN) vaginal cream Apply one pea-sized amount around the opening of the urethra daily for 2 weeks, then 3 times weekly moving forward. 08/20/22   Vaillancourt, Aldona Bar, PA-C  Continuous Blood Gluc Sensor (FREESTYLE LIBRE 3 SENSOR) MISC Place 1 sensor on the skin every 14 days. Use to check glucose continuously 12/19/22   Pleas Koch, NP  ferrous sulfate 325 (65 FE) MG tablet Take 1 tablet (325 mg total) by mouth daily with breakfast. 12/19/22 03/19/23  Val Riles, MD  folic acid (FOLVITE) 1 MG tablet Take 1 mg by mouth daily.    [provider]  glipiZIDE (GLUCOTROL XL) 2.5 MG 24 hr tablet TAKE 1 TABLET (2.5 MG TOTAL) BY MOUTH DAILY WITH BREAKFAST. FOR DIABETES. 08/04/22   Pleas Koch, NP  LACTOBACILLUS PROBIOTIC PO Take 1 capsule by mouth daily.    [provider]  Melatonin 10 MG TABS Take 10 mg by mouth at bedtime.    [provider]  memantine (NAMENDA) 5 MG tablet Take 1 tablet (5 mg total) by mouth 2 (two) times daily. For memory. 07/14/22   Pleas Koch, NP  Menthol, Topical Analgesic, (BIOFREEZE) 4 % GEL Apply topically in the morning and at bedtime.    [provider]  pantoprazole (PROTONIX) 40 MG tablet Take 1 tablet (40 mg total) by mouth daily. 12/19/22 03/19/23  Val Riles, MD  rosuvastatin (CRESTOR) 20 MG tablet TAKE 1 TABLET (20 MG TOTAL) BY MOUTH EVERY EVENING. FOR CHOLESTEROL. 07/13/22   Pleas Koch, NP  Tiotropium  Bromide-Olodaterol (STIOLTO RESPIMAT) 2.5-2.5 MCG/ACT AERS Inhale 2 each into the lungs in the morning.    [provider]     Critical care time: 65 minutes     Venetia Night, AGACNP-BC Acute Care Nurse Practitioner [facility]   [phone] / [phone] Please see Amion for pager details.

## 2023-02-03 NOTE — Anesthesia Procedure Notes (Signed)
Procedure Name: Intubation Date/Time: 02/03/2023 9:58 PM  Performed by: Jerrye Noble, CRNAPre-anesthesia Checklist: Patient identified, Emergency Drugs available, Suction available and Patient being monitored Patient Re-evaluated:Patient Re-evaluated prior to induction Oxygen Delivery Method: Circle system utilized Preoxygenation: Pre-oxygenation with 100% oxygen Induction Type: IV induction and Rapid sequence Laryngoscope Size: McGraph and 3 Grade View: Grade I Tube type: Oral Tube size: 6.5 mm Number of attempts: 1 Airway Equipment and Method: Stylet and Video-laryngoscopy Placement Confirmation: ETT inserted through vocal cords under direct vision, positive ETCO2 and breath sounds checked- equal and bilateral Secured at: 22 cm Tube secured with: Tape Dental Injury: Teeth and Oropharynx as per pre-operative assessment

## 2023-02-03 NOTE — ED Notes (Signed)
2 attempts made at IV placement without success.   Pt transported to CT at this time.

## 2023-02-03 NOTE — Anesthesia Preprocedure Evaluation (Signed)
Anesthesia Evaluation  Patient identified by MRN, date of birth, ID band Patient awake and Patient confused    Reviewed: Allergy & Precautions, NPO status , Patient's Chart, lab work & pertinent test results  History of Anesthesia Complications Negative for: history of anesthetic complications  Airway Mallampati: III  TM Distance: >3 FB Neck ROM: full    Dental  (+) Chipped, Poor Dentition, Missing, Dental Advidsory Given   Pulmonary neg shortness of breath, COPD (3 L),  oxygen dependent, neg recent URI, former smoker   Pulmonary exam normal        Cardiovascular hypertension, (-) angina (-) Past MI Normal cardiovascular exam     Neuro/Psych neg Seizures PSYCHIATRIC DISORDERS Anxiety Depression   Dementia VP shunt for normal pressure hydrocephalus  CVA    GI/Hepatic Neg liver ROS,GERD  Controlled,,  Endo/Other  diabetes, Type 2    Renal/GU      Musculoskeletal   Abdominal   Peds  Hematology negative hematology ROS (+)   Anesthesia Other Findings Past Medical History: 11/05/2021: Acute respiratory failure requiring reintubation (HCC) No date: Anemia No date: Anxiety No date: Anxiety and depression No date: COPD (chronic obstructive pulmonary disease) (HCC) No date: CVA (cerebral vascular accident) ([place]) No date: CVA (cerebral vascular accident) ([place])     Comment:  L sided deficits No date: Dementia ([place]) No date: Depression No date: Diabetes mellitus ([place])     Comment:  Type 2 No date: Diabetes mellitus without complication (HCC) No date: Dysphagia No date: Dysphasia No date: GERD (gastroesophageal reflux disease) No date: History of lung cancer No date: Hyperlipidemia No date: Hypertension 12/13/2021: Influenza A 11/20/2017: Memory disturbance 2018: Normal pressure hydrocephalus ([place]) 08/25/2022: Nose colonized with MRSA     Comment:  a.) noted on pre-surgical swab prior to VP shunt                revision No date: Retinal detachment     Comment:  Right 11/20/2017: Right carotid bruit 02/01/2020: Tardive dyskinesia No date: Tardive dyskinesia No date: Thrombosis     Comment:  Arterial to lower extremity?  Past Surgical History: 07/25/2022: IR FL GUIDED LOC OF NEEDLE/CATH TIP FOR SPINAL INJECTION  RT No date: LUNG REMOVAL, PARTIAL     Comment:  left upper lobe 12/09/2016: VENTRICULOPERITONEAL SHUNT 10/2018: VENTRICULOPERITONEAL SHUNT  BMI    Body Mass Index: 18.95 kg/m      Reproductive/Obstetrics negative OB ROS                             Anesthesia Physical Anesthesia Plan  ASA: 4 and emergent  Anesthesia Plan: General   Post-op Pain Management:    Induction: Intravenous, Rapid sequence and Cricoid pressure planned  PONV Risk Score and Plan: Ondansetron, Dexamethasone and Treatment may vary due to age or medical condition  Airway Management Planned: Oral ETT  Additional Equipment:   Intra-op Plan:   Post-operative Plan: Extubation in OR and Possible Post-op intubation/ventilation  Informed Consent: I have reviewed the patients History and Physical, chart, labs and discussed the procedure including the risks, benefits and alternatives for the proposed anesthesia with the patient or authorized representative who has indicated his/her understanding and acceptance.   Patient has DNR.  Discussed DNR with power of attorney and Suspend DNR.   Dental Advisory Given  Plan Discussed with: Anesthesiologist, CRNA and Surgeon  Anesthesia Plan Comments: (History and phone consent from the patients daughter Terral Zuccarelli at [phone]  Daughter consented for risks of anesthesia including but not limited to:  - adverse reactions to medications - damage to eyes, teeth, lips or other oral mucosa - nerve damage due to positioning  - sore throat or hoarseness - Damage to heart, brain, nerves, lungs, other parts of body or loss of life  She  voiced understanding.)        Anesthesia Quick Evaluation

## 2023-02-04 ENCOUNTER — Encounter: Payer: Self-pay | Admitting: Neurosurgery

## 2023-02-04 ENCOUNTER — Inpatient Hospital Stay: Payer: Medicare PPO

## 2023-02-04 DIAGNOSIS — S065XAA Traumatic subdural hemorrhage with loss of consciousness status unknown, initial encounter: Secondary | ICD-10-CM | POA: Diagnosis not present

## 2023-02-04 DIAGNOSIS — T8501XS Breakdown (mechanical) of ventricular intracranial (communicating) shunt, sequela: Secondary | ICD-10-CM | POA: Diagnosis not present

## 2023-02-04 LAB — URINALYSIS, W/ REFLEX TO CULTURE (INFECTION SUSPECTED)
Bilirubin Urine: NEGATIVE
Glucose, UA: 150 mg/dL — AB
Hgb urine dipstick: NEGATIVE
Ketones, ur: NEGATIVE mg/dL
Leukocytes,Ua: NEGATIVE
Nitrite: NEGATIVE
Protein, ur: NEGATIVE mg/dL
Specific Gravity, Urine: 1.017 (ref 1.005–1.030)
Squamous Epithelial / HPF: NONE SEEN /HPF (ref 0–5)
pH: 6 (ref 5.0–8.0)

## 2023-02-04 LAB — CBC
HCT: 26.2 % — ABNORMAL LOW (ref 36.0–46.0)
Hemoglobin: 7.9 g/dL — ABNORMAL LOW (ref 12.0–15.0)
MCH: 26.4 pg (ref 26.0–34.0)
MCHC: 30.2 g/dL (ref 30.0–36.0)
MCV: 87.6 fL (ref 80.0–100.0)
Platelets: 352 10*3/uL (ref 150–400)
RBC: 2.99 MIL/uL — ABNORMAL LOW (ref 3.87–5.11)
RDW: 15.9 % — ABNORMAL HIGH (ref 11.5–15.5)
WBC: 9.3 10*3/uL (ref 4.0–10.5)
nRBC: 0 % (ref 0.0–0.2)

## 2023-02-04 LAB — BASIC METABOLIC PANEL
Anion gap: 8 (ref 5–15)
BUN: 20 mg/dL (ref 8–23)
CO2: 26 mmol/L (ref 22–32)
Calcium: 8.6 mg/dL — ABNORMAL LOW (ref 8.9–10.3)
Chloride: 107 mmol/L (ref 98–111)
Creatinine, Ser: 0.63 mg/dL (ref 0.44–1.00)
GFR, Estimated: >60 mL/min (ref >60)
Glucose, Bld: 158 mg/dL — ABNORMAL HIGH (ref 70–99)
Potassium: 4 mmol/L (ref 3.5–5.1)
Sodium: 141 mmol/L (ref 135–145)

## 2023-02-04 LAB — SAMPLE TO BLOOD BANK

## 2023-02-04 LAB — MRSA NEXT GEN BY PCR, NASAL: MRSA by PCR Next Gen: DETECTED — AB

## 2023-02-04 LAB — PHOSPHORUS: Phosphorus: 3.2 mg/dL (ref 2.5–4.6)

## 2023-02-04 LAB — GLUCOSE, CAPILLARY
Glucose-Capillary: 111 mg/dL — ABNORMAL HIGH (ref 70–99)
Glucose-Capillary: 113 mg/dL — ABNORMAL HIGH (ref 70–99)
Glucose-Capillary: 122 mg/dL — ABNORMAL HIGH (ref 70–99)
Glucose-Capillary: 136 mg/dL — ABNORMAL HIGH (ref 70–99)
Glucose-Capillary: 155 mg/dL — ABNORMAL HIGH (ref 70–99)
Glucose-Capillary: 171 mg/dL — ABNORMAL HIGH (ref 70–99)
Glucose-Capillary: 206 mg/dL — ABNORMAL HIGH (ref 70–99)

## 2023-02-04 LAB — MAGNESIUM: Magnesium: 2 mg/dL (ref 1.7–2.4)

## 2023-02-04 LAB — TROPONIN I (HIGH SENSITIVITY): Troponin I (High Sensitivity): 8 ng/L (ref <18)

## 2023-02-04 MED ORDER — PANTOPRAZOLE SODIUM 40 MG IV SOLR
40.0000 mg | Freq: Every day | INTRAVENOUS | Status: DC
  Administered 2023-02-04: 40 mg via INTRAVENOUS
  Filled 2023-02-04: qty 10

## 2023-02-04 MED ORDER — ARFORMOTEROL TARTRATE 15 MCG/2ML IN NEBU
15.0000 ug | INHALATION_SOLUTION | Freq: Two times a day (BID) | RESPIRATORY_TRACT | Status: DC
  Administered 2023-02-04 – 2023-02-13 (×15): 15 ug via RESPIRATORY_TRACT
  Filled 2023-02-04 (×21): qty 2

## 2023-02-04 MED ORDER — ACETAMINOPHEN 325 MG PO TABS
650.0000 mg | ORAL_TABLET | ORAL | Status: DC | PRN
  Administered 2023-02-04 – 2023-02-12 (×9): 650 mg via ORAL
  Filled 2023-02-04 (×9): qty 2

## 2023-02-04 MED ORDER — PANTOPRAZOLE SODIUM 40 MG PO TBEC: 40.0000 mg | DELAYED_RELEASE_TABLET | Freq: Every day | ORAL | Status: DC

## 2023-02-04 MED ORDER — ALBUTEROL SULFATE (2.5 MG/3ML) 0.083% IN NEBU: 2.5000 mg | INHALATION_SOLUTION | RESPIRATORY_TRACT | Status: DC | PRN

## 2023-02-04 MED ORDER — MUPIROCIN 2 % EX OINT
TOPICAL_OINTMENT | Freq: Two times a day (BID) | CUTANEOUS | Status: DC
  Administered 2023-02-05 – 2023-02-11 (×2): 1 via NASAL
  Filled 2023-02-04: qty 22

## 2023-02-04 MED ORDER — MEMANTINE HCL 5 MG PO TABS
5.0000 mg | ORAL_TABLET | Freq: Two times a day (BID) | ORAL | Status: DC
  Administered 2023-02-04 – 2023-02-13 (×18): 5 mg via ORAL
  Filled 2023-02-04 (×19): qty 1

## 2023-02-04 MED ORDER — LABETALOL HCL 5 MG/ML IV SOLN: 10.0000 mg | INTRAVENOUS | Status: DC | PRN

## 2023-02-04 MED ORDER — ROSUVASTATIN CALCIUM 20 MG PO TABS
20.0000 mg | ORAL_TABLET | Freq: Every evening | ORAL | Status: DC
  Administered 2023-02-04 – 2023-02-12 (×8): 20 mg via ORAL
  Filled 2023-02-04 (×7): qty 1
  Filled 2023-02-04: qty 2

## 2023-02-04 MED ORDER — INSULIN ASPART 100 UNIT/ML IJ SOLN
0.0000 [IU] | INTRAMUSCULAR | Status: DC
  Administered 2023-02-04: 1 [IU] via SUBCUTANEOUS
  Administered 2023-02-04: 2 [IU] via SUBCUTANEOUS
  Administered 2023-02-04 – 2023-02-05 (×2): 3 [IU] via SUBCUTANEOUS
  Administered 2023-02-05: 2 [IU] via SUBCUTANEOUS
  Administered 2023-02-05: 1 [IU] via SUBCUTANEOUS
  Administered 2023-02-05: 5 [IU] via SUBCUTANEOUS
  Administered 2023-02-06: 2 [IU] via SUBCUTANEOUS
  Administered 2023-02-06 (×2): 1 [IU] via SUBCUTANEOUS
  Administered 2023-02-06 – 2023-02-07 (×2): 2 [IU] via SUBCUTANEOUS
  Filled 2023-02-04 (×9): qty 1

## 2023-02-04 MED ORDER — LIDOCAINE HCL 1 % IJ SOLN
5.0000 mL | Freq: Once | INTRAMUSCULAR | Status: AC
  Administered 2023-02-04: 5 mL via INTRADERMAL
  Filled 2023-02-04: qty 10

## 2023-02-04 MED ORDER — UMECLIDINIUM BROMIDE 62.5 MCG/ACT IN AEPB
1.0000 | INHALATION_SPRAY | Freq: Every day | RESPIRATORY_TRACT | Status: DC
  Administered 2023-02-05 – 2023-02-13 (×9): 1 via RESPIRATORY_TRACT
  Filled 2023-02-04: qty 7

## 2023-02-04 MED ORDER — MELATONIN 5 MG PO TABS
10.0000 mg | ORAL_TABLET | Freq: Every day | ORAL | Status: DC
  Administered 2023-02-04 – 2023-02-12 (×8): 10 mg via ORAL
  Filled 2023-02-04 (×8): qty 2

## 2023-02-04 MED ORDER — CHLORHEXIDINE GLUCONATE CLOTH 2 % EX PADS
6.0000 | MEDICATED_PAD | Freq: Every day | CUTANEOUS | Status: DC
  Administered 2023-02-04: 6 via TOPICAL

## 2023-02-04 NOTE — Progress Notes (Addendum)
Awakens easily and speech is clear. Daughter in to visit mid morning. 1328 Head drain removed by D.Derrill Kay PA. Patient tolerated removal well. Passed bedside swallow evaluation. Sips of sprite per patient request. More alert as the day progressed. Daughter upset because patient did not know her name. Daughter got more up because patient would not or could not feed self 1630 Dr [name] in to see patient. Discussed patient's mentation etc. Dr. [name] stated that patient had declined steadily for months. MD to call daughter at home.Late note [place] evaluation done. Patient passed.

## 2023-02-04 NOTE — Progress Notes (Signed)
Progress Note  History: Analaura A Fulmore is s/p right frontal burr hole for SDH drainage and removal of VP shunt   POD1: Pt appears to be at neurologic baseline. Left sided weakness improving   Physical Exam: Vitals:   02/04/23 0600 02/04/23 0700  BP: (!) 113/52 (!) 107/58  Pulse: 82 74  Resp: 11 11  Temp:    SpO2: 100% 100%    Resting comfortably. Arouses to voice. Oriented to self. MAEW Incision covered with post-op dressing   Data:  Other tests/results:  CT head 02/04/23 Narrative & Impression  CLINICAL DATA:  77 year old female with increased right side subdural hematoma. Postoperative day 1 status post right frontal burr hole drainage of subdural hematoma. History of right side CSF shunt.   EXAM: CT HEAD WITHOUT CONTRAST   TECHNIQUE: Contiguous axial images were obtained from the base of the skull through the vertex without intravenous contrast.   RADIATION DOSE REDUCTION: This exam was performed according to the departmental dose-optimization program which includes automated exposure control, adjustment of the mA and/or kV according to patient size and/or use of iterative reconstruction technique.   COMPARISON:  Head CT yesterday and earlier.   FINDINGS: Brain: Right frontal approach CSF shunt, reservoir and subcutaneous catheter have been removed. New right superior vertex burr hole and percutaneous subdural drain placed.   Subtotal drainage of the mixed density right subdural hematoma since yesterday. But postoperative gas now within the non collapsed right side subdural space. Trace residual posterior convexity subdural fluid adjacent to the indwelling drain on series 3, image 20.   Small volume right frontal horn pneumo ventricle and trace left layering IVH (series 3, image 18) following CSF shunt removal.   Decreased intracranial mass effect. Less mass effect on the right lateral ventricle. Leftward midline shift has decreased to 6-7 mm (previously up  to 13 mm). Normalized suprasellar cistern. Other basilar cisterns are patent. No evidence of trapped ventricle.   Chronic right thalamic lacunar infarct. Patchy and confluent cerebral white matter hypodensity. No cortically based acute infarct identified.   Vascular: Calcified atherosclerosis at the skull base.   Skull: New right vertex burr hole superimposed on previous shunt related burr hole. Elsewhere the skull appears intact.   Sinuses/Orbits: Visualized paranasal sinuses and mastoids are stable and well aerated.   Other: New postoperative changes to the right scalp vertex. Skin staples in place. Orbits appear negative.   IMPRESSION: 1. Postoperative subtotal drainage of the mixed density right subdural fluid. Postoperative gas now occupying the non-collapsed right subdural space, with percutaneous subdural drain in place. 2. Small volume pneumo-ventricle and trace dependent intraventricular blood following CSF shunt removal. Improved lateral ventricle patency and regressed leftward midline shift (now 6-7 mm). 3. Normalized basilar cisterns. Chronic cerebral small vessel disease.     Electronically Signed   By: Genevie Ann M.D.   On: 02/04/2023 06:08    Assessment/Plan:  CORNETTA KEISTER is a 77 y.o presenting with AMS and weakness s/p right burr hole for evacuation of SDH.   - mobilize - pain control - DVT prophylaxis - will remove drain this morning - remainder of care per [place] PA-C Department of Neurosurgery

## 2023-02-04 NOTE — Anesthesia Postprocedure Evaluation (Signed)
Anesthesia Post Note  Patient: Amanda Porter  Procedure(s) Performed: BURR HOLE X 1, ventriculoperitoneal shunt removal (Right)  Patient location during evaluation: SICU Anesthesia Type: General Post-procedure mental status: Patient drowsy. Pain management: pain level controlled Vital Signs Assessment: post-procedure vital signs reviewed and stable Cardiovascular status: stable Postop Assessment: no apparent nausea or vomiting Anesthetic complications: no  No notable events documented.   Last Vitals:  Vitals:   02/04/23 0800 02/04/23 0900  BP: (!) 148/68 (!) 109/54  Pulse: 80 85  Resp: 13 12  Temp:    SpO2: 100% (!) 89%    Last Pain:  Vitals:   02/04/23 0600  TempSrc:   PainSc: 0-No pain                 Alison Stalling

## 2023-02-04 NOTE — H&P (Addendum)
NAME:  Amanda Porter, MRN:  [id number], DOB:  1946-05-05, LOS: 1 ADMISSION DATE:  02/03/2023, CONSULTATION DATE: 02/03/2023 REFERRING MD: Dr. Jari Pigg, CHIEF COMPLAINT: Altered mental status  History of Present Illness:  77 year old female presenting to [facility] ED on 02/03/2023 due to concerns for altered mental status.  History provided per chart review and by her daughter bedside, who is Porter paramedic and is the patient's caretaker. The patient had fallen on February 10 at rehab, and was found to have Porter subdural hematoma.  Repeat imaging had showed it to be stable on February 12.  Patient also recently being treated for UTI.  At home daughter noticed over the last few weeks patient had increased fatigue.  And over the last few days she had decreased mobility of her left hand.  Starting on 02/03/2023 the patient became more altered, with her daughter describing some hallucinations and inability to comprehend instructions which is not her baseline.  She has also noticed some increased slurred speech as well as word finding difficulties. Daughter denies any recent fever or chills, no chest pain/shortness of breath, abdominal pain/nausea/vomiting/diarrhea.  The patient has not been having any pain with urination but has had increased frequency/urgency in the setting of her recent urinary tract infection.  ED course: Upon arrival patient alert and oriented x 1-2 & stable vital signs.  She is having difficulty following instructions.  Lab work largely reassuring with mild hyperglycemia and mild anemia that appears to be at baseline. Imaging significant for worsening subdural hematoma and case discussed with neurosurgery on-call.  Patient is well-known to them and the decision was made to take the patient urgently to the OR for drain placement and VP shunt removal.  PCCM consulted for admission.  Sounds Porter initial Vitals: 98.3, 18, 90, 101/57 and 100% on room air Significant labs: (Labs/ Imaging personally reviewed) I,  Domingo Pulse [place], AGACNP-BC, personally viewed and interpreted this ECG. EKG Interpretation: Date: 02/03/2023, EKG Time: 20:39, Rate: 83, Rhythm: NSR, QRS Axis: Normal, Intervals: Borderline prolonged QTc, ST/T Wave abnormalities: None, Narrative Interpretation: NSR Chemistry: Na+: 136, K+: 4.2, BUN/Cr.:  20/0.72, Serum CO2/ AG: 25/10 Hematology: WBC: 7.7, Hgb: 8.9,  Troponin: 6, COVID-19 & Influenza Porter/B: Negative  CXR 02/03/2023: Intact shunt catheter.  Mild bibasilar atelectasis KUB 02/03/2023: Shunt catheter is noted to be intact DG cervical spine 02/03/2023: Stable appearing right shunt catheter DG skull 1-3 views 02/03/2023: Unremarkable shunt tubing over the head CT head without contrast 02/03/2023: Mixed density right cerebral convexity subdural hematoma is significantly increased in size since 01/12/2023, currently measuring up to 2.4 cm with worsened mass effect with 1.3 cm rightward midline shift.  Stable position of right frontal ventricular catheter without evidence of hydrocephalus or trapped ventricle. CT cervical spine without contrast 02/03/2023: No acute fracture or traumatic malalignment of the cervical spine  PCCM consulted for admission due to worsening subdural hematoma requiring urgent neurosurgical drain placement and VP shunt removal.  Pertinent  Medical History  Normal pressure hydrocephalus status post VP shunt (2018 with multiple revisions) Dementia CVA with left-sided hemiparesis Frequent UTIs Type 2 diabetes mellitus Falls Hyperlipidemia Hypertension Lung cancer status post upper left lobectomy Tardive dyskinesia (2021) Depression and anxiety DVT  COPD chronic 3 L [place] Former smoker (50+ years)  Significant Hospital Events: Including procedures, antibiotic start and stop dates in addition to other pertinent events   02/03/2023: Admit to ICU due to worsening subdural hematoma with mass effect requiring urgent neurosurgical drain placement and VP shunt  removal  02/04/2023: No acute events overnight   Interim History / Subjective:  As outlined above   Objective   Blood pressure (!) 107/58, pulse 74, temperature 99 F (37.2 C), resp. rate 11, height '5\' 2"'$  (1.575 m), weight 44.5 kg, SpO2 100 %.        Intake/Output Summary (Last 24 hours) at 02/04/2023 [id number] Last data filed at 02/04/2023 0600 Gross per 24 hour  Intake 50 ml  Output 1030 ml  Net -980 ml   Filed Weights   02/03/23 1913  Weight: 44.5 kg    Examination: General: Acute on chronically-ill appearing elderly female, NAD on 3L O2 via nasal canula  HEENT: MM pink/moist, anicteric, atraumatic, neck supple, no JVD  Neuro: Alert to self, follows commands, PERRLA, moves all extremities although limited movement of left shoulder  CV: NSR, s1s2, no m/r/g, 2+ radial/2+ distal pulses, no edema  Pulm: Diminished throughout, even, non labored  GI: +BS x4, soft, non tender, non distended  Skin:  See below  Extremities: Normal bulk and tone  Resolved Hospital Problem list     Assessment & Plan:  Worsening Subdural Hematoma with mass effect and rightward shift requiring emergent drain placement and VP shunt removal - Neurosurgery consulted, appreciate input- recs below - Q 2 h neuro checks x 12 - Trend CBC  - Transfuse for hgb <7 - SCD's for VTE prophylaxis - Strict bedrest - Falls precautions - Avoid sedating medications as tolerated  Chronic COPD without exacerbation~stable  PMHx: Lung Cancer s/p upper Left Lobectomy, former smoker - Supplemental oxygen as needed, maintain SpO2 > 88% - Continue outpatient scheduled bronchodilator therapy   - Bronchodilators as needed  Acute encephalopathy secondary to worsening subdural hematoma in the setting of baseline dementia Anxiety & depression - Continue outpatient memantine & melatonin - Hold buspar & klonopin for now   Hypertension HLD - Restart outpatient crestor - Maintain normo-tension, will consider IV labetalol prn for  SBP >145 - Will resume outpatient antihypertensives once able to tolerate po's   Type 2 Diabetes Mellitus Steroid Induced Hyperglycemia Hemoglobin A1C: 6.8 - Monitor CBG Q 4 hours - SSI sensitive dosing - Target range while in ICU: 140-180 - Follow ICU hyper/hypo-glycemia protocol  Best Practice (right click and "Reselect all SmartList Selections" daily)  Diet/type: NPO w/ oral meds DVT prophylaxis: SCD GI prophylaxis: PPI Lines: N/Porter Foley:  Yes, and it is still needed Code Status:  limited > daughter confirmed that the patient is amenable to being intubated with mechanical ventilation for procedures but not for end of life resuscitation Last date of multidisciplinary goals of care discussion [02/04/23]  Labs   CBC: Recent Labs  Lab 02/03/23 1929 02/04/23 0056  WBC 7.7 9.3  HGB 8.9* 7.9*  HCT 30.0* 26.2*  MCV 88.5 87.6  PLT 411* [id number]    Basic Metabolic Panel: Recent Labs  Lab 02/03/23 1929 02/04/23 0056  NA 136 141  K 4.2 4.0  CL 101 107  CO2 25 26  GLUCOSE 150* 158*  BUN 20 20  CREATININE 0.72 0.63  CALCIUM 9.1 8.6*  MG  --  2.0  PHOS  --  3.2   GFR: Estimated Creatinine Clearance (by C-G formula based on SCr of 0.63 mg/dL) Female: 41.4 mL/min Female: 48.7 mL/min Recent Labs  Lab 02/03/23 1929 02/04/23 0056  WBC 7.7 9.3    Liver Function Tests: Recent Labs  Lab 02/03/23 1929  AST 28  ALT 15  ALKPHOS 63  BILITOT 0.4  PROT 7.9  ALBUMIN 3.5   No results for input(s): "LIPASE", "AMYLASE" in the last 168 hours. No results for input(s): "AMMONIA" in the last 168 hours.  ABG    Component Value Date/Time   PHART 7.302 (L) 11/04/2021 2221   PCO2ART 55.7 (H) 11/04/2021 2221   PO2ART 530 (H) 11/04/2021 2221   HCO3 34.6 (H) 11/10/2022 1611   TCO2 29 11/04/2021 2221   O2SAT 72.5 11/10/2022 1611     Coagulation Profile: No results for input(s): "INR", "PROTIME" in the last 168 hours.  Cardiac Enzymes: No results for input(s): "CKTOTAL", "CKMB",  "CKMBINDEX", "TROPONINI" in the last 168 hours.  HbA1C: Hemoglobin A1C  Date/Time Value Ref Range Status  01/22/2023 01:04 PM 6.8 (Porter) 4.0 - 5.6 % Final   Hgb A1c MFr Bld  Date/Time Value Ref Range Status  04/04/2022 10:25 AM 7.6 (H) 4.6 - 6.5 % Final    Comment:    Glycemic Control Guidelines for People with Diabetes:Non Diabetic:  <6%Goal of Therapy: <7%Additional Action Suggested:  >8%   11/09/2021 01:50 AM 7.1 (H) 4.8 - 5.6 % Final    Comment:    (NOTE) Pre diabetes:          5.7%-6.4%  Diabetes:              >6.4%  Glycemic control for   <7.0% adults with diabetes     CBG: Recent Labs  Lab 02/03/23 2029 02/03/23 2313 02/04/23 0039  GLUCAP 132* 115* 155*    Review of Systems: Positives in BOLD  Patient is Porter poor historian Gen: Denies fever, chills, weight change, fatigue, night sweats HEENT: Denies blurred vision, double vision, hearing loss, tinnitus, sinus congestion, rhinorrhea, sore throat, neck stiffness, dysphagia PULM: Denies shortness of breath, cough, sputum production, hemoptysis, wheezing CV: Denies chest pain, edema, orthopnea, paroxysmal nocturnal dyspnea, palpitations GI: Denies abdominal pain, nausea, vomiting, diarrhea, hematochezia, melena, constipation, change in bowel habits GU: Denies dysuria, hematuria, polyuria, oliguria, urethral discharge Endocrine: Denies hot or cold intolerance, polyuria, polyphagia or appetite change Derm: Denies rash, dry skin, scaling or peeling skin change Heme: Denies easy bruising, bleeding, bleeding gums Neuro: Denies headache, numbness, weakness, word finding difficulty, slurred speech, loss of memory or consciousness  Past Medical History:  Amanda Porter "[name]",  has Porter past medical history of Acute respiratory failure requiring reintubation ([place]) (11/05/2021), Anemia, Anxiety, Anxiety and depression, COPD (chronic obstructive pulmonary disease) ([place]), CVA (cerebral vascular accident) ([place]), CVA (cerebral vascular  accident) ([place]), Dementia ([place]), Depression, Diabetes mellitus ([place]), Diabetes mellitus without complication ([place]), Dysphagia, Dysphasia, Falls, GERD (gastroesophageal reflux disease), HCAP (healthcare-associated pneumonia) (12/16/2022), History of lung cancer, Hyperlipidemia, Hypertension, Influenza Porter (12/13/2021), Memory disturbance (11/20/2017), Normal pressure hydrocephalus ([place]) (2018), 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   Surgical History:   Past Surgical History:  Procedure Laterality Date   BURR HOLE Right 02/03/2023   Procedure: BURR HOLE X 1, ventriculoperitoneal shunt removal;  Surgeon: Meade Maw, MD;  Location: [facility] ORS;  Service: Neurosurgery;  Laterality: Right;   ESOPHAGOGASTRODUODENOSCOPY (EGD) WITH PROPOFOL N/Porter 12/16/2022   Procedure: ESOPHAGOGASTRODUODENOSCOPY (EGD) WITH PROPOFOL;  Surgeon: Lucilla Lame, MD;  Location: [facility] ENDOSCOPY;  Service: Endoscopy;  Laterality: N/Porter;   HIP ARTHROPLASTY Right 09/07/2022   Procedure: ARTHROPLASTY BIPOLAR HIP (HEMIARTHROPLASTY);  Surgeon: Lovell Sheehan, MD;  Location: [facility] ORS;  Service: Orthopedics;  Laterality: Right;   IR FL GUIDED LOC OF NEEDLE/CATH TIP FOR SPINAL INJECTION RT  07/25/2022  LUNG REMOVAL, PARTIAL     left upper lobe   SHUNT REVISION VENTRICULAR-PERITONEAL N/Porter 09/19/2022   Procedure: VENTRICULOPERITONEAL SHUNT REVISION;  Surgeon: Meade Maw, MD;  Location: [facility] ORS;  Service: Neurosurgery;  Laterality: N/Porter;   VENTRICULOPERITONEAL SHUNT  12/09/2016   VENTRICULOPERITONEAL SHUNT  10/2018     Social History:   reports that Amanda Porter "[name]" quit smoking about 5 years ago. Amanda Porter "[name]"'s smoking use included cigarettes. Amanda Porter "Marlou Sa" has Porter 25.00 pack-year smoking history. Amanda Porter "Marlou Sa" has been exposed to tobacco smoke. Amanda Porter "Marlou Sa" has never used  smokeless tobacco. Amanda Porter. Nicol "Marlou Sa" reports that Clemencia Porter. [place] "Marlou Sa" does not currently use alcohol. Jnya Porter. Macari "Marlou Sa" reports that Daphna Porter. Paulos "Marlou Sa" does not currently use drugs.   Family History:  Jaclyne Porter Bovee "[name]"'s family history includes Cancer in [place]. Montanari "[name]"'s brother, brother, and father; Diabetes in [place]. [place] "[name]"'s sister and sister; Heart disease in [place]. [place] "[name]"'s sister; Hyperlipidemia in [place] Porter. [place] "[name]"'s sister; Hypertension in [place] Porter. Saad "[name]"'s sister and sister; Pancreatic cancer in [place] Porter. Torosyan "[name]"'s sister; Pneumonia in [place]. [place] "[name]"'s mother; Throat cancer in [place]. Boike "[name]"'s brother. There is no history of Breast cancer.   Allergies Allergies  Allergen Reactions   Lexapro [Escitalopram Oxalate] Other (See Comments)    Dizziness, nausea, fatigue   Neosporin  [Neomycin-Polymyxin-Gramicidin] Rash    (Neosporin)   Neosporin [Bacitracin-Polymyxin B] Rash     Home Medications  Prior to Admission medications   Medication Sig Start Date End Date Taking? Authorizing Provider  busPIRone (BUSPAR) 30 MG tablet Take 30 mg by mouth 2 (two) times daily. 02/03/23  Yes [provider]  PROLIA 60 MG/ML SOSY injection Inject 60 mg into the skin every 6 (six) months. 01/27/23  Yes [provider]  acetaminophen (TYLENOL) 325 MG tablet Take 650 mg by mouth every 4 (four) hours as needed for mild pain.    [provider]  albuterol (VENTOLIN HFA) 108 (90 Base) MCG/ACT inhaler INHALE 2 PUFFS BY MOUTH EVERY 4 HOURS AS NEEDED 07/11/22   Baird Lyons D, MD  Albuterol Sulfate 2.5 MG/0.5ML NEBU Inhale 2.5 mg into the lungs every 6 (six) hours as needed (shortness of breath).    [provider]  amLODipine (NORVASC) 2.5 MG tablet Take 1 tablet (2.5 mg total) by mouth daily. for blood pressure. 08/19/22   Pleas Koch, NP  ascorbic acid (VITAMIN C) 500 MG tablet Take 1 tablet (500 mg total)  by mouth daily. 12/19/22 03/19/23  Val Riles, MD  aspirin 81 MG chewable tablet Chew 1 tablet (81 mg total) by mouth daily. Hold for now and resume on 12/22/22 12/22/22   Val Riles, MD  buPROPion (WELLBUTRIN XL) 300 MG 24 hr tablet Take 1 tablet (300 mg total) by mouth daily with breakfast. for anxiety and depression. 11/10/22   Pleas Koch, NP  busPIRone (BUSPAR) 15 MG tablet Take 30 mg by mouth 2 (two) times daily.    [provider]  Calcium Carb-Cholecalciferol (CALCIUM/VITAMIN D PO) Take 1 tablet by mouth daily.    [provider]  clonazePAM (KLONOPIN) 1 MG tablet Take 1 mg by mouth at bedtime.    [provider]  conjugated estrogens (PREMARIN) vaginal cream Apply one pea-sized amount around the opening of the urethra daily for 2 weeks, then 3 times weekly moving forward. 08/20/22   Vaillancourt,  Samantha, PA-C  Continuous Blood Gluc Sensor (FREESTYLE LIBRE 3 SENSOR) MISC Place 1 sensor on the skin every 14 days. Use to check glucose continuously 12/19/22   Pleas Koch, NP  ferrous sulfate 325 (65 FE) MG tablet Take 1 tablet (325 mg total) by mouth daily with breakfast. 12/19/22 03/19/23  Val Riles, MD  folic acid (FOLVITE) 1 MG tablet Take 1 mg by mouth daily.    [provider]  glipiZIDE (GLUCOTROL XL) 2.5 MG 24 hr tablet TAKE 1 TABLET (2.5 MG TOTAL) BY MOUTH DAILY WITH BREAKFAST. FOR DIABETES. 08/04/22   Pleas Koch, NP  LACTOBACILLUS PROBIOTIC PO Take 1 capsule by mouth daily.    [provider]  Melatonin 10 MG TABS Take 10 mg by mouth at bedtime.    [provider]  memantine (NAMENDA) 5 MG tablet Take 1 tablet (5 mg total) by mouth 2 (two) times daily. For memory. 07/14/22   Pleas Koch, NP  Menthol, Topical Analgesic, (BIOFREEZE) 4 % GEL Apply topically in the morning and at bedtime.    [provider]  pantoprazole (PROTONIX) 40 MG tablet Take 1 tablet (40 mg total) by mouth daily. 12/19/22  03/19/23  Val Riles, MD  rosuvastatin (CRESTOR) 20 MG tablet TAKE 1 TABLET (20 MG TOTAL) BY MOUTH EVERY EVENING. FOR CHOLESTEROL. 07/13/22   Pleas Koch, NP  Tiotropium Bromide-Olodaterol (STIOLTO RESPIMAT) 2.5-2.5 MCG/ACT AERS Inhale 2 each into the lungs in the morning.    [provider]     Critical care time: 32 minutes     Donell Beers, [place] Pager [phone] (please enter 7 digits) PCCM Consult Pager [phone] (please enter 7 digits)

## 2023-02-04 NOTE — Consult Note (Signed)
PHARMACY CONSULT NOTE - FOLLOW UP  Pharmacy Consult for Electrolyte Monitoring and Replacement   Recent Labs: Potassium (mmol/L)  Date Value  02/04/2023 4.0   Magnesium (mg/dL)  Date Value  02/04/2023 2.0   Calcium (mg/dL)  Date Value  02/04/2023 8.6 (L)   Albumin (g/dL)  Date Value  02/03/2023 3.5   Phosphorus (mg/dL)  Date Value  02/04/2023 3.2   Sodium (mmol/L)  Date Value  02/04/2023 141     Assessment: 77 yo F presents with AMS. PMH includes hydrocephalus and dementia, and a subdural hematoma on February 10 following multiple falls. Patient also recently being treated for UTI.  Goal of Therapy:  Electrolytes WNL  Plan:  No replacement warranted at this time Recheck electrolytes with AM labs  Will M. Ouida Sills, PharmD PGY-1 Pharmacy Resident 02/04/2023 6:43 AM

## 2023-02-04 NOTE — Progress Notes (Signed)
Initial Nutrition Assessment  DOCUMENTATION CODES:   Severe malnutrition in context of chronic illness  INTERVENTION:   Ensure Enlive po BID with diet advancement, each supplement provides 350 kcal and 20 grams of protein.  Magic cup TID with diet advancement, each supplement provides 290 kcal and 9 grams of protein  MVI po daily with diet advancement   Daily weights   Pt at high refeed risk  NUTRITION DIAGNOSIS:   Severe Malnutrition related to chronic illness (lung ca, COPD) as evidenced by severe fat depletion, severe muscle depletion.  GOAL:   Patient will meet greater than or equal to 90% of their needs  MONITOR:   Diet advancement, Labs, Weight trends, Skin, I & O's  REASON FOR ASSESSMENT:   Rounds    ASSESSMENT:   77 y/o female with h/o normal pressure hydrocephalus s/p VP shunt, HLD, COPD, dementia, dysphagia secondary to esophageal narrowing, MDD, DM, anxiety, HTN, CVA, lung cancer s/p lobectomy, GERD, hip fracture, tardive dyskinesia, DVT and IDA who is admitted with chronic subdural hematoma s/p right frontal burr hole 3/5.  Met with pt in room today. Pt reports that she is feeling ok today. Pt reports fair appetite and oral intake at baseline. Pt reports that she drinks vanilla Ensure daily at home. Pt reports that she is hungry today. Pt is currently laying flat for drain placement and is unable to be advanced on a diet at this time. RD will add supplements and MVI with diet advancement. Pt is likely at refeed risk. Per chart, pt appears weight stable pta.   Medications reviewed and include: insulin, melatonin, protonix  Labs reviewed: K 4.0 wnl, P 3.2 wnl, Mg 2.0 wnl Hgb 7.9(L), Hct 26.2(L) Cbgs- 136, 171, 206, 155 x 24 hrs  AIC 6.8(H)- 2/22  NUTRITION - FOCUSED PHYSICAL EXAM:  Flowsheet Row Most Recent Value  Orbital Region Moderate depletion  Upper Arm Region Severe depletion  Thoracic and Lumbar Region Severe depletion  Buccal Region Moderate  depletion  Temple Region Severe depletion  Clavicle Bone Region Severe depletion  Clavicle and Acromion Bone Region Severe depletion  Scapular Bone Region Severe depletion  Dorsal Hand Severe depletion  Patellar Region Severe depletion  Anterior Thigh Region Severe depletion  Posterior Calf Region Severe depletion  Edema (RD Assessment) None  Hair Reviewed  Eyes Reviewed  Mouth Reviewed  Skin Reviewed  Nails Reviewed   Diet Order:   Diet Order             Diet NPO time specified  Diet effective now                  EDUCATION NEEDS:   Education needs have been addressed  Skin:  Skin Assessment: Reviewed RN Assessment (incision head)  Last BM:  pta  Height:   Ht Readings from Last 1 Encounters:  02/03/23 '5\' 2"'$  (1.575 m)    Weight:   Wt Readings from Last 1 Encounters:  02/03/23 44.5 kg    Ideal Body Weight:  50 kg  BMI:  Body mass index is 17.92 kg/m.  Estimated Nutritional Needs:   Kcal:  1300-1500kcal/day  Protein:  65-75g/day  Fluid:  1.1-1.3L/day  Koleen Distance MS, RD, LDN Please refer to [facility] for RD and/or RD on-call/weekend/after hours pager

## 2023-02-04 NOTE — Progress Notes (Signed)
eLink Physician-Brief Progress Note Patient Name: KARLISA KACZANOWSKI DOB: 04-24-1946 MRN: [id number]   Date of Service  02/04/2023  HPI/Events of Note  77 y.o. adult who comes in with concern for altered mental status.  Patient has a history of hydrocephalus and dementia at baseline but reports worsening altered mental status   She is unable to chronic subdural hematoma causing significant compressive symptoms prompting urgent surgical intervention.  She underwent right frontal burr hole for drainage and was referred to ICU for further management.  Chronic anemia noted.  No obvious coagulopathy.  DNR CODE STATUS noted.  CT head scheduled for the morning.     eICU Interventions  Generally, goal SBP less than 140, maintaining 100-1 20 without intervention Baseline mental status Avoid benzos, mind altering substances Every hour neurocheck Drain management per neurosurgery No acute intervention is indicated for now, ground team to evaluate.     Intervention Category Minor Interventions: Agitation / anxiety - evaluation and management Evaluation Type: New Patient Evaluation  Marquet Faircloth 02/04/2023, 12:44 AM

## 2023-02-05 ENCOUNTER — Telehealth: Payer: Self-pay

## 2023-02-05 DIAGNOSIS — I10 Essential (primary) hypertension: Secondary | ICD-10-CM | POA: Diagnosis not present

## 2023-02-05 DIAGNOSIS — S065XAA Traumatic subdural hemorrhage with loss of consciousness status unknown, initial encounter: Secondary | ICD-10-CM | POA: Diagnosis not present

## 2023-02-05 DIAGNOSIS — R4182 Altered mental status, unspecified: Secondary | ICD-10-CM | POA: Diagnosis not present

## 2023-02-05 DIAGNOSIS — T8501XS Breakdown (mechanical) of ventricular intracranial (communicating) shunt, sequela: Secondary | ICD-10-CM | POA: Diagnosis not present

## 2023-02-05 LAB — CBC WITH DIFFERENTIAL/PLATELET
Abs Immature Granulocytes: 0.02 10*3/uL (ref 0.00–0.07)
Basophils Absolute: 0.1 10*3/uL (ref 0.0–0.1)
Basophils Relative: 1 %
Eosinophils Absolute: 0.1 10*3/uL (ref 0.0–0.5)
Eosinophils Relative: 1 %
HCT: 26.8 % — ABNORMAL LOW (ref 36.0–46.0)
Hemoglobin: 8 g/dL — ABNORMAL LOW (ref 12.0–15.0)
Immature Granulocytes: 0 %
Lymphocytes Relative: 38 %
Lymphs Abs: 3.1 10*3/uL (ref 0.7–4.0)
MCH: 25.9 pg — ABNORMAL LOW (ref 26.0–34.0)
MCHC: 29.9 g/dL — ABNORMAL LOW (ref 30.0–36.0)
MCV: 86.7 fL (ref 80.0–100.0)
Monocytes Absolute: 1 10*3/uL (ref 0.1–1.0)
Monocytes Relative: 13 %
Neutro Abs: 3.9 10*3/uL (ref 1.7–7.7)
Neutrophils Relative %: 47 %
Platelets: 378 10*3/uL (ref 150–400)
RBC: 3.09 MIL/uL — ABNORMAL LOW (ref 3.87–5.11)
RDW: 15.5 % (ref 11.5–15.5)
WBC: 8.2 10*3/uL (ref 4.0–10.5)
nRBC: 0 % (ref 0.0–0.2)

## 2023-02-05 LAB — PHOSPHORUS: Phosphorus: 3.4 mg/dL (ref 2.5–4.6)

## 2023-02-05 LAB — BASIC METABOLIC PANEL
Anion gap: 7 (ref 5–15)
BUN: 18 mg/dL (ref 8–23)
CO2: 29 mmol/L (ref 22–32)
Calcium: 9 mg/dL (ref 8.9–10.3)
Chloride: 101 mmol/L (ref 98–111)
Creatinine, Ser: 0.62 mg/dL (ref 0.44–1.00)
GFR, Estimated: >60 mL/min (ref >60)
Glucose, Bld: 128 mg/dL — ABNORMAL HIGH (ref 70–99)
Potassium: 3.9 mmol/L (ref 3.5–5.1)
Sodium: 137 mmol/L (ref 135–145)

## 2023-02-05 LAB — GLUCOSE, CAPILLARY
Glucose-Capillary: 116 mg/dL — ABNORMAL HIGH (ref 70–99)
Glucose-Capillary: 148 mg/dL — ABNORMAL HIGH (ref 70–99)
Glucose-Capillary: 211 mg/dL — ABNORMAL HIGH (ref 70–99)
Glucose-Capillary: 189 mg/dL — ABNORMAL HIGH (ref 70–99)
Glucose-Capillary: 267 mg/dL — ABNORMAL HIGH (ref 70–99)
Glucose-Capillary: 77 mg/dL (ref 70–99)

## 2023-02-05 LAB — SURGICAL PATHOLOGY

## 2023-02-05 LAB — MAGNESIUM: Magnesium: 1.9 mg/dL (ref 1.7–2.4)

## 2023-02-05 MED ORDER — FERROUS SULFATE 325 (65 FE) MG PO TABS
325.0000 mg | ORAL_TABLET | Freq: Every day | ORAL | Status: DC
  Administered 2023-02-06 – 2023-02-13 (×8): 325 mg via ORAL
  Filled 2023-02-05 (×8): qty 1

## 2023-02-05 MED ORDER — FLORANEX PO PACK
1.0000 g | PACK | Freq: Every day | ORAL | Status: DC
  Administered 2023-02-06 – 2023-02-13 (×8): 1 g via ORAL
  Filled 2023-02-05 (×8): qty 1

## 2023-02-05 MED ORDER — VITAMIN C 500 MG PO TABS
500.0000 mg | ORAL_TABLET | Freq: Every day | ORAL | Status: DC
  Administered 2023-02-05 – 2023-02-13 (×9): 500 mg via ORAL
  Filled 2023-02-05 (×9): qty 1

## 2023-02-05 MED ORDER — ADULT MULTIVITAMIN W/MINERALS CH
1.0000 | ORAL_TABLET | Freq: Every day | ORAL | Status: DC
  Administered 2023-02-06 – 2023-02-13 (×8): 1 via ORAL
  Filled 2023-02-05 (×8): qty 1

## 2023-02-05 MED ORDER — PANTOPRAZOLE SODIUM 40 MG PO TBEC
40.0000 mg | DELAYED_RELEASE_TABLET | Freq: Every day | ORAL | Status: DC
  Administered 2023-02-05 – 2023-02-12 (×8): 40 mg via ORAL
  Filled 2023-02-05 (×8): qty 1

## 2023-02-05 MED ORDER — ENSURE ENLIVE PO LIQD
237.0000 mL | Freq: Two times a day (BID) | ORAL | Status: DC
  Administered 2023-02-06 – 2023-02-13 (×15): 237 mL via ORAL

## 2023-02-05 MED ORDER — FOLIC ACID 1 MG PO TABS
1.0000 mg | ORAL_TABLET | Freq: Every day | ORAL | Status: DC
  Administered 2023-02-05 – 2023-02-13 (×9): 1 mg via ORAL
  Filled 2023-02-05 (×9): qty 1

## 2023-02-05 MED ORDER — ENOXAPARIN SODIUM 30 MG/0.3ML IJ SOSY
30.0000 mg | PREFILLED_SYRINGE | INTRAMUSCULAR | Status: DC
  Administered 2023-02-05 – 2023-02-12 (×8): 30 mg via SUBCUTANEOUS
  Filled 2023-02-05 (×8): qty 0.3

## 2023-02-05 MED ORDER — BUSPIRONE HCL 10 MG PO TABS: 30.0000 mg | ORAL_TABLET | Freq: Two times a day (BID) | ORAL | Status: DC

## 2023-02-05 MED ORDER — BUPROPION HCL ER (XL) 300 MG PO TB24: 300.0000 mg | ORAL_TABLET | Freq: Every day | ORAL | Status: DC

## 2023-02-05 NOTE — Telephone Encounter (Signed)
I rescheduled her CT for 02/18/23, arrive at 3pm at [facility].

## 2023-02-05 NOTE — Progress Notes (Signed)
PHARMACIST - PHYSICIAN COMMUNICATION  CONCERNING: IV to Oral Route Change Policy  RECOMMENDATION: This patient is receiving pantoprazole by the intravenous route.  Based on criteria approved by the Pharmacy and Therapeutics Committee, the intravenous medication(s) is/are being converted to the equivalent oral dose form(s).  DESCRIPTION: These criteria include: The patient is eating (either orally or via tube) and/or has been taking other orally administered medications for a least 24 hours The patient has no evidence of active gastrointestinal bleeding or impaired GI absorption (gastrectomy, short bowel, patient on TNA or NPO).  If you have questions about this conversion, please contact the [place] 02/05/2023 11:44 AM

## 2023-02-05 NOTE — Progress Notes (Signed)
[place] [facility] [place] NAME: Amanda Porter    MR#:  [id number]  DATE OF BIRTH:  25-Dec-1945  SUBJECTIVE:   Seen earlier. No family at bedside Spoke with dter on the phone Per Rn ate some BF    VITALS:  Blood pressure (!) 119/47, pulse 86, temperature 98.6 F (37 C), resp. rate 14, height '5\' 2"'$  (1.575 m), weight 44.5 kg, SpO2 100 %.  PHYSICAL EXAMINATION:   GENERAL:  77 y.o.-year-old patient with no acute distress.  LUNGS: Normal breath sounds bilaterally, no wheezing CARDIOVASCULAR: S1, S2 normal. No murmur   ABDOMEN: Soft, nontender, nondistended. Bowel sounds present.  EXTREMITIES: No  edema b/l.    NEUROLOGIC: nonfocal  patient is sleepy SKIN: No obvious rash, lesion, or ulcer.   LABORATORY PANEL:  CBC Recent Labs  Lab 02/05/23 0549  WBC 8.2  HGB 8.0*  HCT 26.8*  PLT 378    Chemistries  Recent Labs  Lab 02/03/23 1929 02/04/23 0056 02/05/23 0549  NA 136   < > 137  K 4.2   < > 3.9  CL 101   < > 101  CO2 25   < > 29  GLUCOSE 150*   < > 128*  BUN 20   < > 18  CREATININE 0.72   < > 0.62  CALCIUM 9.1   < > 9.0  MG  --    < > 1.9  AST 28  --   --   ALT 15  --   --   ALKPHOS 63  --   --   BILITOT 0.4  --   --    < > = values in this interval not displayed.   Cardiac Enzymes No results for input(s): "TROPONINI" in the last 168 hours. RADIOLOGY:  CT HEAD WO CONTRAST (5MM)  Result Date: 02/04/2023 CLINICAL DATA:  77 year old female with increased right side subdural hematoma. Postoperative day 1 status post right frontal burr hole drainage of subdural hematoma. History of right side CSF shunt. EXAM: CT HEAD WITHOUT CONTRAST TECHNIQUE: Contiguous axial images were obtained from the base of the skull through the vertex without intravenous contrast. RADIATION DOSE REDUCTION: This exam was performed according to the departmental dose-optimization program which includes automated exposure control, adjustment of the mA and/or kV  according to patient size and/or use of iterative reconstruction technique. COMPARISON:  Head CT yesterday and earlier. FINDINGS: Brain: Right frontal approach CSF shunt, reservoir and subcutaneous catheter have been removed. New right superior vertex burr hole and percutaneous subdural drain placed. Subtotal drainage of the mixed density right subdural hematoma since yesterday. But postoperative gas now within the non collapsed right side subdural space. Trace residual posterior convexity subdural fluid adjacent to the indwelling drain on series 3, image 20. Small volume right frontal horn pneumo ventricle and trace left layering IVH (series 3, image 18) following CSF shunt removal. Decreased intracranial mass effect. Less mass effect on the right lateral ventricle. Leftward midline shift has decreased to 6-7 mm (previously up to 13 mm). Normalized suprasellar cistern. Other basilar cisterns are patent. No evidence of trapped ventricle. Chronic right thalamic lacunar infarct. Patchy and confluent cerebral white matter hypodensity. No cortically based acute infarct identified. Vascular: Calcified atherosclerosis at the skull base. Skull: New right vertex burr hole superimposed on previous shunt related burr hole. Elsewhere the skull appears intact. Sinuses/Orbits: Visualized paranasal sinuses and mastoids are stable and well aerated. Other: New postoperative changes to the right  scalp vertex. Skin staples in place. Orbits appear negative. IMPRESSION: 1. Postoperative subtotal drainage of the mixed density right subdural fluid. Postoperative gas now occupying the non-collapsed right subdural space, with percutaneous subdural drain in place. 2. Small volume pneumo-ventricle and trace dependent intraventricular blood following CSF shunt removal. Improved lateral ventricle patency and regressed leftward midline shift (now 6-7 mm). 3. Normalized basilar cisterns. Chronic cerebral small vessel disease. Electronically  Signed   By: Genevie Ann M.D.   On: 02/04/2023 06:08   DG Abd 1 View  Result Date: 02/03/2023 CLINICAL DATA:  Altered mental status, evaluate shunt integrity EXAM: ABDOMEN - 1 VIEW COMPARISON:  None Available. FINDINGS: Scattered large and small bowel gas is noted. Mild retained fecal material is noted within the colon. The visualized shunt catheter extends into the right hemipelvis. No kinking is noted. The catheter is intact. Right hip replacement is seen. IMPRESSION: Shunt catheter is noted to be intact. Electronically Signed   By: Inez Catalina M.D.   On: 02/03/2023 20:45   DG Chest 1 View  Result Date: 02/03/2023 CLINICAL DATA:  Altered mental status and possible shunt malfunction EXAM: PORTABLE CHEST 1 VIEW COMPARISON:  01/11/2023 FINDINGS: Cardiac shadows within normal limits. Postsurgical changes are noted in left hilum. Lungs are well aerated bilaterally. Minimal basilar atelectasis is seen. No bony abnormality is noted. Shunt catheter is well visualized and intact. IMPRESSION: Intact shunt catheter. Mild bibasilar atelectasis. Electronically Signed   By: Inez Catalina M.D.   On: 02/03/2023 20:44   DG Skull 1-3 Views  Result Date: 02/03/2023 CLINICAL DATA:  Evaluate for shunt malfunction EXAM: SKULL - 1-3 VIEW COMPARISON:  09/06/2022 FINDINGS: New shunt since prior, tip from right parietal approach in just left of midline. Where visible and radiopaque the shunt is continuous. No underlying osseous finding. IMPRESSION: Unremarkable shunt tubing over the head. Electronically Signed   By: Jorje Guild M.D.   On: 02/03/2023 20:42   DG Cervical Spine 1 View  Result Date: 02/03/2023 CLINICAL DATA:  Altered mental status and possible shunt malfunction, evaluate integrity EXAM: DG CERVICAL SPINE - 1 VIEW COMPARISON:  09/06/2022 FINDINGS: Right-sided shunt catheter is again identified and stable. IMPRESSION: Stable appearing right shunt catheter. Electronically Signed   By: Inez Catalina M.D.   On:  02/03/2023 20:42   CT Head Wo Contrast  Result Date: 02/03/2023 CLINICAL DATA:  Altered mental status. History of hydrocephalus and dementia at baseline but worsening. Uncoordinated left arm. EXAM: CT HEAD WITHOUT CONTRAST CT CERVICAL SPINE WITHOUT CONTRAST TECHNIQUE: Multidetector CT imaging of the head and cervical spine was performed following the standard protocol without intravenous contrast. Multiplanar CT image reconstructions of the cervical spine were also generated. RADIATION DOSE REDUCTION: This exam was performed according to the departmental dose-optimization program which includes automated exposure control, adjustment of the mA and/or kV according to patient size and/or use of iterative reconstruction technique. COMPARISON:  CT head 01/12/2023, CT head and cervical spine 01/11/2023 FINDINGS: CT HEAD FINDINGS Brain: A right frontal ventricular catheter is stable in position terminating in the right lateral ventricle near the midline. The mixed density right cerebral convexity subdural hematoma is significantly increased in size since 01/12/2023, currently measuring up to 2.4 cm in maximal thickness in the coronal plane, previously measured up to 1.0 cm. There is worsened mass effect with sulcal effacement, near-complete effacement of the right lateral ventricle, and 1.3 cm rightward midline shift. The basal cisterns remain patent. The left lateral ventricle is enlarged, similar to the  prior CT. There is no convincing evidence of developing trapped ventricle. A remote infarct in the right thalamus and patchy additional hypodensity in the remainder of the supratentorial white matter are unchanged. Gray-white differentiation is preserved. Vascular: There is calcification of the bilateral carotid siphons. Skull: Normal. Negative for fracture or focal lesion. Sinuses/Orbits: The imaged paranasal sinuses are clear. The imaged globes and orbits are unremarkable. Other: None. CT CERVICAL SPINE FINDINGS  Alignment: There is unchanged reversal of the normal cervical curvature. There is no antero or retrolisthesis. There is no jumped or perched facet or other evidence of traumatic malalignment. Skull base and vertebrae: Skull base alignment is maintained. Vertebral body heights are preserved. There is no evidence of acute fracture. There is no suspicious osseous lesion. Soft tissues and spinal canal: No prevertebral fluid or swelling. No visible canal hematoma. Disc levels: Multilevel degenerative changes of the cervical spine are stable since the prior study with disc space narrowing most advanced at C5-C6 and C6-C7. There is no evidence of high-grade spinal canal stenosis. Upper chest: The left upper lobe nodule is incompletely imaged on the current study common better evaluated on recent CT chest from 01/07/2023. Other: None. IMPRESSION: 1. Mixed density right cerebral convexity subdural hematoma is significantly increased in size since 01/12/2023, currently measuring up to 2.4 cm with worsened mass effect with 1.3 cm rightward midline shift. 2. Stable position of the right frontal ventricular catheter without evidence of hydrocephalus or trapped ventricle 3. No acute fracture or traumatic malalignment of the cervical spine. Critical Value/emergent results were called by telephone at the time of interpretation on 02/03/2023 at 7:55 pm to provider [facility] , who verbally acknowledged these results. Electronically Signed   By: Valetta Mole M.D.   On: 02/03/2023 19:58   CT Cervical Spine Wo Contrast  Result Date: 02/03/2023 CLINICAL DATA:  Altered mental status. History of hydrocephalus and dementia at baseline but worsening. Uncoordinated left arm. EXAM: CT HEAD WITHOUT CONTRAST CT CERVICAL SPINE WITHOUT CONTRAST TECHNIQUE: Multidetector CT imaging of the head and cervical spine was performed following the standard protocol without intravenous contrast. Multiplanar CT image reconstructions of the cervical spine  were also generated. RADIATION DOSE REDUCTION: This exam was performed according to the departmental dose-optimization program which includes automated exposure control, adjustment of the mA and/or kV according to patient size and/or use of iterative reconstruction technique. COMPARISON:  CT head 01/12/2023, CT head and cervical spine 01/11/2023 FINDINGS: CT HEAD FINDINGS Brain: A right frontal ventricular catheter is stable in position terminating in the right lateral ventricle near the midline. The mixed density right cerebral convexity subdural hematoma is significantly increased in size since 01/12/2023, currently measuring up to 2.4 cm in maximal thickness in the coronal plane, previously measured up to 1.0 cm. There is worsened mass effect with sulcal effacement, near-complete effacement of the right lateral ventricle, and 1.3 cm rightward midline shift. The basal cisterns remain patent. The left lateral ventricle is enlarged, similar to the prior CT. There is no convincing evidence of developing trapped ventricle. A remote infarct in the right thalamus and patchy additional hypodensity in the remainder of the supratentorial white matter are unchanged. Gray-white differentiation is preserved. Vascular: There is calcification of the bilateral carotid siphons. Skull: Normal. Negative for fracture or focal lesion. Sinuses/Orbits: The imaged paranasal sinuses are clear. The imaged globes and orbits are unremarkable. Other: None. CT CERVICAL SPINE FINDINGS Alignment: There is unchanged reversal of the normal cervical curvature. There is no antero or retrolisthesis. There  is no jumped or perched facet or other evidence of traumatic malalignment. Skull base and vertebrae: Skull base alignment is maintained. Vertebral body heights are preserved. There is no evidence of acute fracture. There is no suspicious osseous lesion. Soft tissues and spinal canal: No prevertebral fluid or swelling. No visible canal hematoma.  Disc levels: Multilevel degenerative changes of the cervical spine are stable since the prior study with disc space narrowing most advanced at C5-C6 and C6-C7. There is no evidence of high-grade spinal canal stenosis. Upper chest: The left upper lobe nodule is incompletely imaged on the current study common better evaluated on recent CT chest from 01/07/2023. Other: None. IMPRESSION: 1. Mixed density right cerebral convexity subdural hematoma is significantly increased in size since 01/12/2023, currently measuring up to 2.4 cm with worsened mass effect with 1.3 cm rightward midline shift. 2. Stable position of the right frontal ventricular catheter without evidence of hydrocephalus or trapped ventricle 3. No acute fracture or traumatic malalignment of the cervical spine. Critical Value/emergent results were called by telephone at the time of interpretation on 02/03/2023 at 7:55 pm to provider [facility] , who verbally acknowledged these results. Electronically Signed   By: Valetta Mole M.D.   On: 02/03/2023 19:58    Assessment and Plan Amanda Porter is a 77 y.o. adult who comes in with concern for altered mental status.  Patient has a history of hydrocephalus and dementia at baseline but reports worsening altered mental status she is unable to follow commands and uncoordinated in her left arm patient has had some recent treatments for UTIs with 2 courses of different antibiotics.  Is also had prior brain bleed secondary to falls.    Per ER--Daughter reports that she has had acute change over the past 24 to 48 hours where she seems more altered and confused.  She seems to be having more hallucinations.  She is typically able to transfer and she is often able to do that.  She goes to an adult daycare but unfortunately she seemed to decline and they were concerned so that also was going on and recommended she come into the ER to be evaluated.  She did last have her hydrocephalus and VP shunt evaluation and fixed by  Dr. [name] a few months ago  CT head without contrast 02/03/2023: Mixed density right cerebral convexity subdural hematoma is significantly increased in size since 01/12/2023, currently measuring up to 2.4 cm with worsened mass effect with 1.3 cm rightward midline shift.  Stable position of right frontal ventricular catheter without evidence of hydrocephalus or trapped ventricle.   Worsening Subdural Hematoma with mass effect and rightward shift requiring emergent drain placement and VP shunt removal due to shunt malfunction - Neurosurgery consulted, appreciate input--s/p Shunt removal and drain placement post subdural now remvoed -  OK to start PT/OT, remove foley and start DVT prophylaxis per Dr Adonis Housekeeper --transfer out of ICU  Chronic COPD without exacerbation PMHx: Lung Cancer s/p upper Left Lobectomy, former smoker - Supplemental oxygen as needed, maintain SpO2 > 88% - Bronchodilators as needed   Acute Encephalopathy secondary to worsening subdural hematoma in the setting of baseline dementia Anxiety & Depression - restart outpatient memantine & melatonin on 3/6 -hold Buspar & klonopin for now to avoid sedating and if remains stable resumed 3/8   Hypertension Hyperlipidemia - restart outpatient Crestor - consider restarting losartan as the patient stabilizes   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  >50% time spent on counselling and coordination of care  Note: This dictation was prepared with Dragon dictation along with smaller phrase  technology. Any transcriptional errors that result from this process are unintentional.  [name] M.D    [facility]   CC: Primary care physician; Pleas Koch, NP

## 2023-02-05 NOTE — Progress Notes (Signed)
Progress Note  History: Evalie A Mincy is s/p right frontal burr hole for SDH drainage and removal of VP shunt   POD2: Pt back to cognitive baseline. Denies any headaches this morning and is looking forward to breakfast POD1: Pt appears to be at neurologic baseline. Left sided weakness improving   Physical Exam: Vitals:   02/05/23 0612 02/05/23 0756  BP:    Pulse: 76 79  Resp: 12 17  Temp:    SpO2: 100% 100%    Resting comfortably. Arouses to voice. Oriented to self. MAEW except limited movement in LUE due to baseline frozen shoulder  Incision covered with post-op dressing   Data:  Other tests/results:  CT head 02/04/23 Narrative & Impression  CLINICAL DATA:  77 year old female with increased right side subdural hematoma. Postoperative day 1 status post right frontal burr hole drainage of subdural hematoma. History of right side CSF shunt.   EXAM: CT HEAD WITHOUT CONTRAST   TECHNIQUE: Contiguous axial images were obtained from the base of the skull through the vertex without intravenous contrast.   RADIATION DOSE REDUCTION: This exam was performed according to the departmental dose-optimization program which includes automated exposure control, adjustment of the mA and/or kV according to patient size and/or use of iterative reconstruction technique.   COMPARISON:  Head CT yesterday and earlier.   FINDINGS: Brain: Right frontal approach CSF shunt, reservoir and subcutaneous catheter have been removed. New right superior vertex burr hole and percutaneous subdural drain placed.   Subtotal drainage of the mixed density right subdural hematoma since yesterday. But postoperative gas now within the non collapsed right side subdural space. Trace residual posterior convexity subdural fluid adjacent to the indwelling drain on series 3, image 20.   Small volume right frontal horn pneumo ventricle and trace left layering IVH (series 3, image 18) following CSF shunt removal.    Decreased intracranial mass effect. Less mass effect on the right lateral ventricle. Leftward midline shift has decreased to 6-7 mm (previously up to 13 mm). Normalized suprasellar cistern. Other basilar cisterns are patent. No evidence of trapped ventricle.   Chronic right thalamic lacunar infarct. Patchy and confluent cerebral white matter hypodensity. No cortically based acute infarct identified.   Vascular: Calcified atherosclerosis at the skull base.   Skull: New right vertex burr hole superimposed on previous shunt related burr hole. Elsewhere the skull appears intact.   Sinuses/Orbits: Visualized paranasal sinuses and mastoids are stable and well aerated.   Other: New postoperative changes to the right scalp vertex. Skin staples in place. Orbits appear negative.   IMPRESSION: 1. Postoperative subtotal drainage of the mixed density right subdural fluid. Postoperative gas now occupying the non-collapsed right subdural space, with percutaneous subdural drain in place. 2. Small volume pneumo-ventricle and trace dependent intraventricular blood following CSF shunt removal. Improved lateral ventricle patency and regressed leftward midline shift (now 6-7 mm). 3. Normalized basilar cisterns. Chronic cerebral small vessel disease.     Electronically Signed   By: Genevie Ann M.D.   On: 02/04/2023 06:08    Assessment/Plan:  SHAKEIA LAMPKIN is a 77 y.o presenting with AMS and weakness s/p right burr hole for evacuation of SDH.   - pt ok for DVT prophylaxis  - drain removed 02/04/23 - remainder of care per [place] PA-C Department of Neurosurgery

## 2023-02-05 NOTE — Progress Notes (Signed)
Inpatient Rehab Admissions Coordinator Note:   Per PT recommendations patient was screened for CIR candidacy by Michel Santee, PT. At this time, pt appears to be a potential candidate for CIR. I will place an order for rehab consult for full assessment, per our protocol.  Please contact me any with questions.Shann Medal, PT, DPT [phone] 02/05/23 3:35 PM

## 2023-02-05 NOTE — Consult Note (Addendum)
PHARMACY CONSULT NOTE - FOLLOW UP  Pharmacy Consult for Electrolyte Monitoring and Replacement   Recent Labs: Potassium (mmol/L)  Date Value  02/05/2023 3.9   Magnesium (mg/dL)  Date Value  02/05/2023 1.9   Calcium (mg/dL)  Date Value  02/05/2023 9.0   Albumin (g/dL)  Date Value  02/03/2023 3.5   Phosphorus (mg/dL)  Date Value  02/05/2023 3.4   Sodium (mmol/L)  Date Value  02/05/2023 137     Assessment: 77 yo F presents with AMS and worsening subdural hematoma now s/p burr hole drainage. PMH includes hydrocephalus and dementia, and a subdural hematoma on February 10 following multiple falls. Patient also recently being treated for UTI.  Goal of Therapy:  Electrolytes WNL  Plan:  No electrolyte replacement warranted Pharmacy will sign off electrolyte consult at this time  Will M. Ouida Sills, PharmD PGY-1 Pharmacy Resident 02/05/2023 6:31 AM

## 2023-02-05 NOTE — Telephone Encounter (Signed)
Currently scheduled for head CT on 02/11/23. This was scheduled prior to her unplanned surgery on 02/03/23. Dr [name] would like her to postpone the CT by a couple of weeks. Her current authorization expires 02/22/23.

## 2023-02-05 NOTE — Evaluation (Addendum)
Physical Therapy Evaluation Patient Details Name: Amanda Porter MRN: [id number] DOB: 1946-03-30 Today's Date: 02/05/2023  History of Present Illness  Pt is a 77 y.o. female presenting to hospital with increased AMS (unable to follow commands and uncoordinated in L arm).  H/o hydrocephalus and dementia baseline.  Recent treatments for UTI's.  Prior brain bleed secondary to falls.  Imaging showing mixed density R cerebral convexity SDH (significantly increased in size since 01/12/23) with worsened mass effect with 1.3 cm rightward midline shift.  Pt s/p R frontal burr hole for drainage of SDH and removal of VP shunt 02/03/23.  Drain removed 02/04/23.  PMH includes R VP shunt for NPH (about 5 months prior), anemia, anxiety, COPD, CVA, dementia, DM, R retinal detachment, tardive dyskinesia, R THA.  Clinical Impression  PT/OT co-evaluation performed.  Pt was modified independent ambulating with rollator in December of 2023 but has had decline in status/mobility January 2024 where pt has recently required assist for transfers and ambulating short distances with rollator; increased difficulty using L UE and L LE last 2 weeks.  Prior to hospital admission, pt was living with her daughter and granddaughter on main level of home with 1 step to enter; uses 3 L home O2 baseline; h/o L frozen shoulder (decreased ROM baseline); goes to adult day care 3x's per week; daughter works.  Pt denying any pain during session.  Currently pt is min to mod assist with bed mobility; min assist for transfers (2nd assist for safety; pt noted not to use L hand for transfers); and CGA to min assist to ambulate 10 feet with RW use (assist for walker guidance; 2nd assist for safety and line management).  Pt oriented to person and place only (difficulty verbalizing situation); increased time required to respond to cues/answer questions.  Pt would currently benefit from skilled PT to address noted impairments and functional limitations (see below for  any additional details).  Upon hospital discharge, pt would benefit from ongoing therapy.    Recommendations for follow up therapy are one component of a multi-disciplinary discharge planning process, led by the attending physician.  Recommendations may be updated based on patient status, additional functional criteria and insurance authorization.  Follow Up Recommendations Acute inpatient rehab (3hours/day)      Assistance Recommended at Discharge Frequent or constant Supervision/Assistance  Patient can return home with the following  A little help with walking and/or transfers;A little help with bathing/dressing/bathroom;Assistance with cooking/housework;Assist for transportation;Help with stairs or ramp for entrance    Equipment Recommendations Rolling walker (2 wheels);BSC/3in1  Recommendations for Other Services       Functional Status Assessment Patient has had a recent decline in their functional status and demonstrates the ability to make significant improvements in function in a reasonable and predictable amount of time.     Precautions / Restrictions Precautions Precautions: Fall Precaution Comments: s/p R frontal burr hole Restrictions Weight Bearing Restrictions: No      Mobility  Bed Mobility Overal bed mobility: Needs Assistance Bed Mobility: Supine to Sit, Sit to Supine     Supine to sit: Min assist, Mod assist, HOB elevated Sit to supine: Min assist, Mod assist   General bed mobility comments: assist for trunk and B LE's; vc's for technique    Transfers Overall transfer level: Needs assistance Equipment used: Rolling walker (2 wheels) Transfers: Sit to/from Stand Sit to Stand: Min assist, +2 safety/equipment           General transfer comment: vc's for UE placement (  pt did not use L UE to stand); assist to steady    Ambulation/Gait Ambulation/Gait assistance: Min guard, Min assist, +2 safety/equipment Gait Distance (Feet): 10 Feet Assistive  device: Rolling walker (2 wheels)   Gait velocity: decreased     General Gait Details: partial step through gait pattern; assist for walker guidance  Stairs            Wheelchair Mobility    Modified Rankin (Stroke Patients Only)       Balance Overall balance assessment: Needs assistance Sitting-balance support: No upper extremity supported, Feet supported Sitting balance-Leahy Scale: Fair Sitting balance - Comments: steady static sitting; mild R lean in sitting (pt able to self correct with cues) Postural control: Right lateral lean Standing balance support: Bilateral upper extremity supported, During functional activity Standing balance-Leahy Scale: Fair Standing balance comment: occasional assist to steady                             Pertinent Vitals/Pain Pain Assessment Pain Assessment: No/denies pain HR 86 bpm at rest but increased up to 117 bpm with activity; O2 sats WFL on 2.5 L O2 via nasal cannula during sessions activities.  Addendum:  BP 115/53 pre-mobility and 136/61 post activity (nurse notified).    Home Living Family/patient expects to be discharged to:: Private residence Living Arrangements: Children Available Help at Discharge: Family;Available PRN/intermittently (Pt's daughter works; pt attends adult day care 3x's a week.) Type of Home: House Home Access: Stairs to enter Entrance Stairs-Rails: None Entrance Stairs-Number of Steps: 1   Home Layout: Two level;Able to live on main level with bedroom/bathroom Home Equipment: Rollator (4 wheels);Shower seat;Grab bars - tub/shower;Toilet riser;Wheelchair - manual Additional Comments: Pt says she lives with her sister but actually lives with her daughter and daughter's child.    Prior Function Prior Level of Function : Needs assist             Mobility Comments: In December pt was modified independent ambulating with rollator but has had decline in status since January requiring assist  for transfers and walking 10-20 feet (and 40-50 feet when going to adult day care).       Hand Dominance   Dominant Hand: Right    Extremity/Trunk Assessment   Upper Extremity Assessment Upper Extremity Assessment: Defer to OT evaluation (H/o L frozen shoulder with decreased ROM baseline)    Lower Extremity Assessment Lower Extremity Assessment: Difficult to assess due to impaired cognition (at least 3/5 B hip flexion and knee flexion/extension; DF/PF appears at least 2+/5 (difficulty following cues to assess))       Communication   Communication:  (increased time to respond)  Cognition Arousal/Alertness: Awake/alert Behavior During Therapy: WFL for tasks assessed/performed Overall Cognitive Status: Impaired/Different from baseline                                 General Comments: Oriented to person and hospital; pt did not know date and had difficulty verbalizing why she was in hospital.  Increased time to respond to questions and cues.        General Comments General comments (skin integrity, edema, etc.): dressing in place R frontal region.  Nursing cleared pt for participation in physical therapy.  Pt agreeable to PT session.  Pt's daughter present during session.  Per PA Derrill Kay 02/05/23: no restrictions on mobility.    Exercises  Assessment/Plan    PT Assessment Patient needs continued PT services  PT Problem List Decreased strength;Decreased activity tolerance;Decreased balance;Decreased mobility;Decreased coordination;Decreased cognition;Decreased knowledge of use of DME;Decreased knowledge of precautions;Decreased skin integrity;Pain       PT Treatment Interventions DME instruction;Gait training;Stair training;Functional mobility training;Therapeutic activities;Therapeutic exercise;Balance training;Patient/family education    PT Goals (Current goals can be found in the Care Plan section)  Acute Rehab PT Goals Patient Stated Goal: to improve  mobility PT Goal Formulation: With patient/family Time For Goal Achievement: 02/19/23 Potential to Achieve Goals: Good    Frequency 7X/week     Co-evaluation PT/OT/SLP Co-Evaluation/Treatment: Yes Reason for Co-Treatment: For patient/therapist safety;To address functional/ADL transfers PT goals addressed during session: Mobility/safety with mobility;Balance;Proper use of DME OT goals addressed during session: ADL's and self-care       AM-PAC PT "6 Clicks" Mobility  Outcome Measure Help needed turning from your back to your side while in a flat bed without using bedrails?: A Little Help needed moving from lying on your back to sitting on the side of a flat bed without using bedrails?: A Lot Help needed moving to and from a bed to a chair (including a wheelchair)?: A Little Help needed standing up from a chair using your arms (e.g., wheelchair or bedside chair)?: A Little Help needed to walk in hospital room?: A Little Help needed climbing 3-5 steps with a railing? : A Lot 6 Click Score: 16    End of Session Equipment Utilized During Treatment: Gait belt;Oxygen (2.5 L O2 via nasal cannula) Activity Tolerance: Patient tolerated treatment well Patient left: in bed;with call bell/phone within reach;with bed alarm set;with family/visitor present Nurse Communication: Mobility status;Precautions;Other (comment) (pt's BP end of session) PT Visit Diagnosis: Other abnormalities of gait and mobility (R26.89);Muscle weakness (generalized) (M62.81);History of falling (Z91.81)    Time: [id number] PT Time Calculation (min) (ACUTE ONLY): 32 min   Charges:   PT Evaluation $PT Eval Low Complexity: 1 Low PT Treatments $Therapeutic Activity: 8-22 mins       Leitha Bleak, PT 02/05/23, 2:45 PM  Leitha Bleak, PT 02/05/23, 2:54 PM

## 2023-02-05 NOTE — Evaluation (Addendum)
Occupational Therapy Evaluation Patient Details Name: Amanda Porter MRN: [id number] DOB: [birth year] Today's Date: 02/05/2023   History of Present Illness Pt is a 77 y.o. female presenting to hospital with increased AMS (unable to follow commands and uncoordinated in L arm).  H/o hydrocephalus and dementia baseline.  Recent treatments for UTI's.  Prior brain bleed secondary to falls.  Imaging showing mixed density R cerebral convexity SDH (significantly increased in size since 01/12/23) with worsened mass effect with 1.3 cm rightward midline shift.  Pt s/p R frontal burr hole for drainage of SDH and removal of VP shunt 02/03/23.  Drain removed 02/04/23.  PMH includes R VP shunt for NPH (about 5 months prior), anemia, anxiety, COPD, CVA, dementia, DM, R retinal detachment, tardive dyskinesia, R THA.   Clinical Impression   Amanda Porter was seen for OT evaluation this date. Pt lives with daughter (works full time) and young granddaughter. Pt presents to acute OT demonstrating impaired ADL performance and functional mobility 2/2 decreased activity tolerance and functional strength/ROM/balance deficits. LUE inattention noted.  Pt currently requires MOD A don/doff socks, pt unable to incorporate use of LUE despite cueing and adequate AROM/strength. MIN A + RW sit<>stand and ~10 ft mobility, +2 for lines mgmt. Pt would benefit from skilled OT to address noted impairments and functional limitations (see below for any additional details). Upon hospital discharge, recommend AIR to maximize pt safety and return to PLOF.   Recommendations for follow up therapy are one component of a multi-disciplinary discharge planning process, led by the attending physician.  Recommendations may be updated based on patient status, additional functional criteria and insurance authorization.   Follow Up Recommendations  Acute inpatient rehab (3hours/day)     Assistance Recommended at Discharge Intermittent Supervision/Assistance   Patient can return home with the following A little help with walking and/or transfers;A little help with bathing/dressing/bathroom;Help with stairs or ramp for entrance    Functional Status Assessment  Patient has had a recent decline in their functional status and demonstrates the ability to make significant improvements in function in a reasonable and predictable amount of time.  Equipment Recommendations  BSC/3in1;Hospital bed    Recommendations for Other Services       Precautions / Restrictions Precautions Precautions: Fall Precaution Comments: s/p R frontal burr hole Restrictions Weight Bearing Restrictions: No      Mobility Bed Mobility Overal bed mobility: Needs Assistance Bed Mobility: Supine to Sit, Sit to Supine     Supine to sit: Min assist, HOB elevated Sit to supine: Mod assist        Transfers Overall transfer level: Needs assistance Equipment used: Rolling walker (2 wheels) Transfers: Sit to/from Stand Sit to Stand: Min assist, +2 safety/equipment           General transfer comment: places LUE on walker once in standing      Balance Overall balance assessment: Needs assistance Sitting-balance support: No upper extremity supported, Feet supported Sitting balance-Leahy Scale: Fair   Postural control: Right lateral lean Standing balance support: Bilateral upper extremity supported, During functional activity Standing balance-Leahy Scale: Fair Standing balance comment: occasional assist to steady                           ADL either performed or assessed with clinical judgement   ADL Overall ADL's : Needs assistance/impaired  General ADL Comments: MOD A don/doff socks, pt unable to incorporate use of LUE despite cueing and adequate AROM/strength. MIN A + RW simulated BSC t/f      Pertinent Vitals/Pain Pain Assessment Pain Assessment: No/denies pain     Hand Dominance  Right   Extremity/Trunk Assessment Upper Extremity Assessment Upper Extremity Assessment: Difficult to assess due to impaired cognition;RUE deficits/detail RUE Deficits / Details: H/o L frozen shoulder with decreased ROM baseline.   Lower Extremity Assessment Lower Extremity Assessment: Difficult to assess due to impaired cognition       Communication Communication Communication: Receptive difficulties   Cognition Arousal/Alertness: Awake/alert Behavior During Therapy: WFL for tasks assessed/performed Overall Cognitive Status: Impaired/Different from baseline Area of Impairment: Orientation, Attention, Memory, Following commands                 Orientation Level: Disoriented to, Time, Situation Current Attention Level: Focused Memory: Decreased recall of precautions, Decreased short-term memory Following Commands: Follows one step commands inconsistently       General Comments: increased time to respond to questions/cues. Pt unable to incorporate use of non-dominant LUE into activites despite cueing         Home Living Family/patient expects to be discharged to:: Private residence Living Arrangements: Children Available Help at Discharge: Family;Available PRN/intermittently (adult day care 3x/day, daughter works) Type of Home: House Home Access: Stairs to enter [employer] of Steps: 1 Entrance Stairs-Rails: None Home Layout: Two level;Able to live on main level with bedroom/bathroom     Bathroom Shower/Tub: Teacher, early years/pre: Standard     Home Equipment: Rollator (4 wheels);Shower seat;Grab bars - tub/shower;Toilet riser;Wheelchair - manual   Additional Comments: lives with daughter (works full time) and grand child (autistic)      Prior Functioning/Environment Prior Level of Function : Needs assist             Mobility Comments: In December pt was modified independent ambulating with rollator but has had decline in status  since January requiring assist for transfers and walking 10-20 feet (and 40-50 feet when going to adult day care).          OT Problem List: Decreased strength;Decreased range of motion;Impaired balance (sitting and/or standing);Decreased activity tolerance;Decreased safety awareness      OT Treatment/Interventions: Self-care/ADL training;Therapeutic exercise;Energy conservation;DME and/or AE instruction;Therapeutic activities;Patient/family education;Balance training;Neuromuscular education    OT Goals(Current goals can be found in the care plan section) Acute Rehab OT Goals Patient Stated Goal: to go home OT Goal Formulation: With patient/family Time For Goal Achievement: 02/19/23 Potential to Achieve Goals: Good ADL Goals Pt Will Perform Grooming: with modified independence;standing Pt Will Perform Lower Body Dressing: with modified independence;sit to/from stand Pt Will Transfer to Toilet: with modified independence;ambulating;regular height toilet  OT Frequency: Min 3X/week    Co-evaluation PT/OT/SLP Co-Evaluation/Treatment: Yes Reason for Co-Treatment: For patient/therapist safety;To address functional/ADL transfers PT goals addressed during session: Mobility/safety with mobility;Balance;Proper use of DME OT goals addressed during session: ADL's and self-care      AM-PAC OT "6 Clicks" Daily Activity     Outcome Measure Help from another person eating meals?: A Little Help from another person taking care of personal grooming?: A Little Help from another person toileting, which includes using toliet, bedpan, or urinal?: A Lot Help from another person bathing (including washing, rinsing, drying)?: A Lot Help from another person to put on and taking off regular upper body clothing?: A Little Help from another person to put on and  taking off regular lower body clothing?: A Lot 6 Click Score: 15   End of Session    Activity Tolerance: Patient tolerated treatment well Patient  left: in bed;with call bell/phone within reach;with bed alarm set;with family/visitor present  OT Visit Diagnosis: Other abnormalities of gait and mobility (R26.89);Muscle weakness (generalized) (M62.81)                Time: 1400-1416 OT Time Calculation (min): 16 min Charges:  OT General Charges $OT Visit: 1 Visit OT Evaluation $OT Eval Moderate Complexity: 1 Mod  Dessie Coma, M.S. OTR/L  02/05/23, 4:11 PM  ascom [phone]

## 2023-02-06 ENCOUNTER — Telehealth: Payer: Self-pay

## 2023-02-06 DIAGNOSIS — R4182 Altered mental status, unspecified: Secondary | ICD-10-CM | POA: Diagnosis not present

## 2023-02-06 DIAGNOSIS — I10 Essential (primary) hypertension: Secondary | ICD-10-CM | POA: Diagnosis not present

## 2023-02-06 DIAGNOSIS — S065XAA Traumatic subdural hemorrhage with loss of consciousness status unknown, initial encounter: Secondary | ICD-10-CM | POA: Diagnosis not present

## 2023-02-06 DIAGNOSIS — T8501XS Breakdown (mechanical) of ventricular intracranial (communicating) shunt, sequela: Secondary | ICD-10-CM | POA: Diagnosis not present

## 2023-02-06 LAB — GLUCOSE, CAPILLARY
Glucose-Capillary: 123 mg/dL — ABNORMAL HIGH (ref 70–99)
Glucose-Capillary: 129 mg/dL — ABNORMAL HIGH (ref 70–99)
Glucose-Capillary: 137 mg/dL — ABNORMAL HIGH (ref 70–99)
Glucose-Capillary: 139 mg/dL — ABNORMAL HIGH (ref 70–99)
Glucose-Capillary: 181 mg/dL — ABNORMAL HIGH (ref 70–99)
Glucose-Capillary: 188 mg/dL — ABNORMAL HIGH (ref 70–99)

## 2023-02-06 MED ORDER — BUPROPION HCL ER (XL) 300 MG PO TB24
300.0000 mg | ORAL_TABLET | Freq: Every day | ORAL | Status: DC
  Administered 2023-02-07 – 2023-02-13 (×7): 300 mg via ORAL
  Filled 2023-02-06 (×7): qty 1

## 2023-02-06 MED ORDER — BUSPIRONE HCL 10 MG PO TABS
30.0000 mg | ORAL_TABLET | Freq: Two times a day (BID) | ORAL | Status: DC
  Administered 2023-02-06 – 2023-02-13 (×14): 30 mg via ORAL
  Filled 2023-02-06 (×14): qty 3

## 2023-02-06 MED ORDER — AMLODIPINE BESYLATE 5 MG PO TABS
2.5000 mg | ORAL_TABLET | Freq: Every day | ORAL | Status: DC
  Administered 2023-02-06 – 2023-02-13 (×8): 2.5 mg via ORAL
  Filled 2023-02-06 (×8): qty 1

## 2023-02-06 MED ORDER — ASPIRIN 81 MG PO CHEW
81.0000 mg | CHEWABLE_TABLET | Freq: Every day | ORAL | Status: DC
  Administered 2023-02-06 – 2023-02-13 (×8): 81 mg via ORAL
  Filled 2023-02-06 (×8): qty 1

## 2023-02-06 MED ORDER — CHLORHEXIDINE GLUCONATE CLOTH 2 % EX PADS
6.0000 | MEDICATED_PAD | Freq: Every day | CUTANEOUS | Status: DC
  Administered 2023-02-06 – 2023-02-13 (×8): 6 via TOPICAL

## 2023-02-06 NOTE — Consult Note (Signed)
Physical Medicine and Rehabilitation Consult Reason for Consult: Assess for candidacy for CIR Referring Physician: [name], MD   HPI: Amanda Porter is a 77 y.o. female  who presented to the hospital with increased AMS and impaired coordination in her left arm. She has a history of hydrocephalus and dementia and baseline. Has had recent treatment for UTIs. Has had a prior brain bleed secondary to falls. Imaging showed a mixed density right cerebral convexity SDH significantly increased in size since 01/12/23 with worsened mass effect with 1.3 cm rightward midline shift. Patient is s/p right frontal burr hole for drainage of the SDH and removal of the VP shunt on 02/03/23. Drain was removed on 02/04/23. PMH includes R VP shunt for NPH about 5 months prior, anemia, anxiety, COPD, CVA, dementia, DM, right retinal detachment, tardive dyskinesia, and R THA. Physical Medicine & Rehabilitation was consulted to assess candidacy for CIR.     ROS + right frontal discomfort from insicion Past Medical History:  Diagnosis Date   Acute respiratory failure requiring reintubation ([place]) 11/05/2021   Anemia    Anxiety    Anxiety and depression    COPD (chronic obstructive pulmonary disease) ([facility])    CVA (cerebral vascular accident) ([place])    CVA (cerebral vascular accident) ([place])    L sided deficits   Dementia ([place])    Depression    Diabetes mellitus ([place])    Type 2   Diabetes mellitus without complication ([place])    Dysphagia    Dysphasia    Falls    GERD (gastroesophageal reflux disease)    HCAP (healthcare-associated pneumonia) 12/16/2022   History of lung cancer    Hyperlipidemia    Hypertension    Influenza A 12/13/2021   Memory disturbance 11/20/2017   Normal pressure hydrocephalus ([place]) 2018   Nose colonized with MRSA 08/25/2022   a.) noted on pre-surgical swab prior to VP shunt revision   Retinal detachment    Right   Right carotid bruit 11/20/2017   Tardive dyskinesia 02/01/2020    Tardive dyskinesia    Thrombosis    Arterial to lower extremity?   Past Surgical History:  Procedure Laterality Date   BURR HOLE Right 02/03/2023   Procedure: BURR HOLE X 1, ventriculoperitoneal shunt removal;  Surgeon: Meade Maw, MD;  Location: [facility];  Service: Neurosurgery;  Laterality: Right;   ESOPHAGOGASTRODUODENOSCOPY (EGD) WITH PROPOFOL N/A 12/16/2022   Procedure: ESOPHAGOGASTRODUODENOSCOPY (EGD) WITH PROPOFOL;  Surgeon: Lucilla Lame, MD;  Location: [facility];  Service: Endoscopy;  Laterality: N/A;   HIP ARTHROPLASTY Right 09/07/2022   Procedure: ARTHROPLASTY BIPOLAR HIP (HEMIARTHROPLASTY);  Surgeon: Lovell Sheehan, MD;  Location: [facility];  Service: Orthopedics;  Laterality: Right;   IR FL GUIDED LOC OF NEEDLE/CATH TIP FOR SPINAL INJECTION RT  07/25/2022   LUNG REMOVAL, PARTIAL     left upper lobe   SHUNT REVISION VENTRICULAR-PERITONEAL N/A 09/19/2022   Procedure: VENTRICULOPERITONEAL SHUNT REVISION;  Surgeon: Meade Maw, MD;  Location: [facility];  Service: Neurosurgery;  Laterality: N/A;   VENTRICULOPERITONEAL SHUNT  12/09/2016   VENTRICULOPERITONEAL SHUNT  10/2018   Family History  Problem Relation Age of Onset   Pneumonia Mother    Cancer Father    Hyperlipidemia Sister    Hypertension Sister    Heart disease Sister    Diabetes Sister    Hypertension Sister    Diabetes Sister    Pancreatic cancer Sister    Throat cancer Brother    Cancer  Brother    Cancer Brother    Breast cancer Neg Hx    Social History:  reports that she quit smoking about 5 years ago. Her smoking use included cigarettes. She has a 25.00 pack-year smoking history. She has been exposed to tobacco smoke. She has never used smokeless tobacco. She reports that she does not currently use alcohol. She reports that she does not currently use drugs. Allergies:  Allergies  Allergen Reactions   Lexapro [Escitalopram Oxalate] Other (See Comments)    Dizziness, nausea, fatigue    Neosporin  [Neomycin-Polymyxin-Gramicidin] Rash    (Neosporin)   Neosporin [Bacitracin-Polymyxin B] Rash   Medications Prior to Admission  Medication Sig Dispense Refill   acetaminophen (TYLENOL) 325 MG tablet Take 650 mg by mouth every 4 (four) hours as needed for mild pain.     albuterol (VENTOLIN HFA) 108 (90 Base) MCG/ACT inhaler INHALE 2 PUFFS BY MOUTH EVERY 4 HOURS AS NEEDED 18 g 12   Albuterol Sulfate 2.5 MG/0.5ML NEBU Inhale 2.5 mg into the lungs every 6 (six) hours as needed (shortness of breath).     amLODipine (NORVASC) 2.5 MG tablet Take 1 tablet (2.5 mg total) by mouth daily. for blood pressure. 90 tablet 1   ascorbic acid (VITAMIN C) 500 MG tablet Take 1 tablet (500 mg total) by mouth daily. 30 tablet 2   aspirin 81 MG chewable tablet Chew 1 tablet (81 mg total) by mouth daily. Hold for now and resume on 12/22/22     buPROPion (WELLBUTRIN XL) 300 MG 24 hr tablet Take 1 tablet (300 mg total) by mouth daily with breakfast. for anxiety and depression. 90 tablet 0   busPIRone (BUSPAR) 30 MG tablet Take 30 mg by mouth 2 (two) times daily.     Calcium Carb-Cholecalciferol (CALCIUM/VITAMIN D PO) Take 1 tablet by mouth daily.     clonazePAM (KLONOPIN) 1 MG tablet Take 1 mg by mouth at bedtime.     conjugated estrogens (PREMARIN) vaginal cream Apply one pea-sized amount around the opening of the urethra daily for 2 weeks, then 3 times weekly moving forward. 30 g 4   ferrous sulfate 325 (65 FE) MG tablet Take 1 tablet (325 mg total) by mouth daily with breakfast. 30 tablet 2   folic acid (FOLVITE) 1 MG tablet Take 1 mg by mouth daily.     glipiZIDE (GLUCOTROL XL) 2.5 MG 24 hr tablet TAKE 1 TABLET (2.5 MG TOTAL) BY MOUTH DAILY WITH BREAKFAST. FOR DIABETES. 90 tablet 0   LACTOBACILLUS PROBIOTIC PO Take 1 capsule by mouth daily.     Melatonin 10 MG TABS Take 10 mg by mouth at bedtime.     memantine (NAMENDA) 5 MG tablet Take 1 tablet (5 mg total) by mouth 2 (two) times daily. For memory. 180  tablet 3   Menthol, Topical Analgesic, (BIOFREEZE) 4 % GEL Apply topically in the morning and at bedtime.     pantoprazole (PROTONIX) 40 MG tablet Take 1 tablet (40 mg total) by mouth daily. 30 tablet 2   rosuvastatin (CRESTOR) 20 MG tablet TAKE 1 TABLET (20 MG TOTAL) BY MOUTH EVERY EVENING. FOR CHOLESTEROL. 90 tablet 3   Tiotropium Bromide-Olodaterol (STIOLTO RESPIMAT) 2.5-2.5 MCG/ACT AERS Inhale 2 each into the lungs in the morning.     Continuous Blood Gluc Sensor (FREESTYLE LIBRE 3 SENSOR) MISC Place 1 sensor on the skin every 14 days. Use to check glucose continuously 6 each 1   PROLIA 60 MG/ML SOSY injection Inject 60 mg  into the skin every 6 (six) months.      Home: Home Living Family/patient expects to be discharged to:: Private residence Living Arrangements: Children, Other (Comment) (granddaughter) Available Help at Discharge: Family, Available PRN/intermittently (daughter arranging 24/7 support) Type of Home: House Home Access: Stairs to enter Technical brewer of Steps: 1 Entrance Stairs-Rails: None Home Layout: Able to live on main level with bedroom/bathroom Bathroom Shower/Tub: Chiropodist: Handicapped height Bathroom Accessibility: Yes Home Equipment: Rollator (4 wheels), Shower seat, Grab bars - tub/shower, Toilet riser, Wheelchair - manual Additional Comments: lives with daughter (works full time) and grand child (autistic)  Lives With: Daughter, Other (Comment) (granddaughter)  Functional History: Prior Function Prior Level of Function : Needs assist Mobility Comments: In December pt was modified independent ambulating with rollator but has had decline in status since January requiring assist for transfers and walking 10-20 feet (and 40-50 feet when going to adult day care). Functional Status:  Mobility: Bed Mobility Overal bed mobility: Needs Assistance Bed Mobility: Supine to Sit, Sit to Supine Supine to sit: Min assist Sit to supine:  Min guard General bed mobility comments: vc's for technique Transfers Overall transfer level: Needs assistance Equipment used: Rolling walker (2 wheels) Transfers: Sit to/from Stand Sit to Stand: Min assist General transfer comment: pt defers citing headache Ambulation/Gait Ambulation/Gait assistance: Min guard, Min assist Gait Distance (Feet): 20 Feet Assistive device: Rolling walker (2 wheels) General Gait Details: partial step through gait pattern; intermittent assist for walker guidance Gait velocity: decreased    ADL: ADL Overall ADL's : Needs assistance/impaired General ADL Comments: MIN A face washing sitting EOB.  Cognition: Cognition Overall Cognitive Status: Impaired/Different from baseline Orientation Level: Oriented to person, Disoriented to place, Disoriented to time, Disoriented to situation Cognition Arousal/Alertness: Awake/alert Behavior During Therapy: WFL for tasks assessed/performed Overall Cognitive Status: Impaired/Different from baseline Area of Impairment: Orientation, Attention, Memory, Following commands Orientation Level: Disoriented to, Time, Situation Current Attention Level: Focused Memory: Decreased recall of precautions, Decreased short-term memory Following Commands: Follows one step commands inconsistently General Comments: increased time to respond to questions/cues. Pt unable to incorporate use of non-dominant LUE into activites despite cueing  Blood pressure 123/61, pulse 93, temperature 99.2 F (37.3 C), resp. rate 18, height '5\' 2"'$  (1.575 m), weight 46.3 kg, SpO2 100 %. Physical Exam Gen: no distress, normal appearing HEENT: oral mucosa pink and moist, NCAT Cardio: Reg rate Chest: normal effort, normal rate of breathing Abd: soft, non-distended Ext: no edema Psych: pleasant, normal affect Skin: incision is c/d/i Neuro: Alert and oriented x2, LUE strength is limited.   Results for orders placed or performed during the hospital  encounter of 02/03/23 (from the past 24 hour(s))  Glucose, capillary     Status: Abnormal   Collection Time: 02/05/23  4:24 PM  Result Value Ref Range   Glucose-Capillary 189 (H) 70 - 99 mg/dL  Glucose, capillary     Status: Abnormal   Collection Time: 02/05/23  7:23 PM  Result Value Ref Range   Glucose-Capillary 267 (H) 70 - 99 mg/dL  Glucose, capillary     Status: None   Collection Time: 02/05/23 11:15 PM  Result Value Ref Range   Glucose-Capillary 77 70 - 99 mg/dL  Glucose, capillary     Status: Abnormal   Collection Time: 02/06/23  3:18 AM  Result Value Ref Range   Glucose-Capillary 137 (H) 70 - 99 mg/dL  Glucose, capillary     Status: Abnormal   Collection Time: 02/06/23  7:29 AM  Result Value Ref Range   Glucose-Capillary 139 (H) 70 - 99 mg/dL  Glucose, capillary     Status: Abnormal   Collection Time: 02/06/23 11:35 AM  Result Value Ref Range   Glucose-Capillary 181 (H) 70 - 99 mg/dL   No results found.  Assessment/Plan: Diagnosis: s/p right burr hole for evacuation of SDH.  Does the need for close, 24 hr/day medical supervision in concert with the patient's rehab needs make it unreasonable for this patient to be served in a less intensive setting? Yes Co-Morbidities requiring supervision/potential complications:  Altered mental status Postoperative pain: continue prn tylenol History of dementia History of CVA Primary HTN: monitor BP TID COPD: continue to monitor respirations Due to bladder management, bowel management, safety, skin/wound care, disease management, medication administration, pain management, and patient education, does the patient require 24 hr/day rehab nursing? Yes Does the patient require coordinated care of a physician, rehab nurse, therapy disciplines of PT, OT, SLP to address physical and functional deficits in the context of the above medical diagnosis(es)? Yes Addressing deficits in the following areas: balance, endurance, locomotion, strength,  transferring, bowel/bladder control, bathing, dressing, feeding, grooming, toileting, and cognition Can the patient actively participate in an intensive therapy program of at least 3 hrs of therapy per day at least 5 days per week? Yes The potential for patient to make measurable gains while on inpatient rehab is excellent Anticipated functional outcomes upon discharge from inpatient rehab are supervision  with PT, supervision with OT, supervision with SLP. Estimated rehab length of stay to reach the above functional goals is: 10-14 days Anticipated discharge destination: Home Overall Rehab/Functional Prognosis: excellent  POST ACUTE RECOMMENDATIONS: This patient's condition is appropriate for continued rehabilitative care in the following setting: CIR Patient has agreed to participate in recommended program. Yes Note that insurance prior authorization may be required for reimbursement for recommended care.   I have personally performed a face to face diagnostic evaluation of this patient. Additionally, I have examined the patient's medical record including any pertinent labs and radiographic images. If the physician assistant has documented in this note, I have reviewed and edited or otherwise concur with the physician assistant's documentation.  Thanks,  [name], MD 02/06/2023

## 2023-02-06 NOTE — Progress Notes (Signed)
Inpatient Rehab Admissions:  Inpatient Rehab Consult received.  Due to pt confusion, NSG recommended AC speak with pt's daughter. I spoke with patient's daughter [name] on the telephone for rehabilitation assessment and to discuss goals and expectations of an inpatient rehab admission.  Discussed average length of stay, insurance authorization requirement, discharge home after completion of CIR. She acknowledged  understanding. She is supportive of pt pursuing CIR. She acknowledged that she understands the requirement post discharge is 24/7 support. She is currently working on arranging that support. Will continue to follow.  Signed: Gayland Curry, [place], [place] Admissions Coordinator [phone]

## 2023-02-06 NOTE — Progress Notes (Signed)
Progress Note  History: Amanda Porter is s/p right frontal burr hole for SDH drainage and removal of VP shunt   POD3: Pt denying headache this morning POD2: Pt back to cognitive baseline. Denies any headaches this morning and is looking forward to breakfast POD1: Pt appears to be at neurologic baseline. Left sided weakness improving   Physical Exam: Vitals:   02/06/23 0702 02/06/23 0800  BP:    Pulse:    Resp:    Temp:  99.1 F (37.3 C)  SpO2: 100%     Sitting up in bed having breakfast. Oriented x 2 MAEW except limited movement in LUE due to baseline frozen shoulder  Incision c/d/I with staples in place  Data:  Other tests/results:  CT head 02/04/23 Narrative & Impression  CLINICAL DATA:  77 year old female with increased right side subdural hematoma. Postoperative day 1 status post right frontal burr hole drainage of subdural hematoma. History of right side CSF shunt.   EXAM: CT HEAD WITHOUT CONTRAST   TECHNIQUE: Contiguous axial images were obtained from the base of the skull through the vertex without intravenous contrast.   RADIATION DOSE REDUCTION: This exam was performed according to the departmental dose-optimization program which includes automated exposure control, adjustment of the mA and/or kV according to patient size and/or use of iterative reconstruction technique.   COMPARISON:  Head CT yesterday and earlier.   FINDINGS: Brain: Right frontal approach CSF shunt, reservoir and subcutaneous catheter have been removed. New right superior vertex burr hole and percutaneous subdural drain placed.   Subtotal drainage of the mixed density right subdural hematoma since yesterday. But postoperative gas now within the non collapsed right side subdural space. Trace residual posterior convexity subdural fluid adjacent to the indwelling drain on series 3, image 20.   Small volume right frontal horn pneumo ventricle and trace left layering IVH (series 3, image  18) following CSF shunt removal.   Decreased intracranial mass effect. Less mass effect on the right lateral ventricle. Leftward midline shift has decreased to 6-7 mm (previously up to 13 mm). Normalized suprasellar cistern. Other basilar cisterns are patent. No evidence of trapped ventricle.   Chronic right thalamic lacunar infarct. Patchy and confluent cerebral white matter hypodensity. No cortically based acute infarct identified.   Vascular: Calcified atherosclerosis at the skull base.   Skull: New right vertex burr hole superimposed on previous shunt related burr hole. Elsewhere the skull appears intact.   Sinuses/Orbits: Visualized paranasal sinuses and mastoids are stable and well aerated.   Other: New postoperative changes to the right scalp vertex. Skin staples in place. Orbits appear negative.   IMPRESSION: 1. Postoperative subtotal drainage of the mixed density right subdural fluid. Postoperative gas now occupying the non-collapsed right subdural space, with percutaneous subdural drain in place. 2. Small volume pneumo-ventricle and trace dependent intraventricular blood following CSF shunt removal. Improved lateral ventricle patency and regressed leftward midline shift (now 6-7 mm). 3. Normalized basilar cisterns. Chronic cerebral small vessel disease.     Electronically Signed   By: Genevie Ann M.D.   On: 02/04/2023 06:08    Assessment/Plan:  Amanda Porter is a 77 y.o presenting with AMS and weakness s/p right burr hole for evacuation of SDH.   - pt ok for DVT prophylaxis  - drain removed 02/04/23 - remainder of care per internal medicine  Cooper Render PA-C Department of Neurosurgery

## 2023-02-06 NOTE — Telephone Encounter (Signed)
-----   Message from [place], [facility] sent at 02/03/2023  3:28 PM EST ----- Regarding: RE: UA Collection That was actually something different - PCP gave pt urine cup to test microalbumin (UACR) on 2/22, she has to wait until UTI resolves in order to give urine for this. It does not look like patient has returned cup yet. The 2/27 urine test was done by urology to test for UTI again. Based on pt message I think she is still having urinary issues - we need to wait for these to resolve before giving urine for the UACR. So it is appropriate that she has not returned cup yet.  Please keep following up on this on a weekly basis or so. Check under chart review - labs - microalbumin order from 2/22. Once it is done the result will say "final result" instead of future  ----- Message ----- From: Marijean Niemann, CMA Sent: 02/03/2023  12:57 PM EST To: Charlton Haws, RPH Subject: UA Collection                                  My trellis asked if patient had returned her urine collection cup. Patient did. If you would like to see the results they are under the visit date of 01/27/2023.

## 2023-02-06 NOTE — Progress Notes (Signed)
Physical Therapy Treatment Patient Details Name: Amanda Porter MRN: [id number] DOB: Nov 27, 1946 Today's Date: 02/06/2023   History of Present Illness Pt is a 77 y.o. female presenting to hospital with increased AMS (unable to follow commands and uncoordinated in L arm).  H/o hydrocephalus and dementia baseline.  Recent treatments for UTI's.  Prior brain bleed secondary to falls.  Imaging showing mixed density R cerebral convexity SDH (significantly increased in size since 01/12/23) with worsened mass effect with 1.3 cm rightward midline shift.  Pt s/p R frontal burr hole for drainage of SDH and removal of VP shunt 02/03/23.  Drain removed 02/04/23.  PMH includes R VP shunt for NPH (about 5 months prior), anemia, anxiety, COPD, CVA, dementia, DM, R retinal detachment, tardive dyskinesia, R THA.    PT Comments    Pt resting in bed upon PT arrival; agreeable to therapy with a little encouragement.  Pt reporting mild R frontal head discomfort from incision during session.  Pt tending to use R UE when cued to use L UE but decreased L inattention noted compared to yesterdays session.  During session pt min assist with bed mobility; min assist with transfers using RW; and CGA to min assist to ambulate 20 feet with RW use (pt requiring a couple short standing rest breaks d/t SOB; pt requiring assist to turn L with RW to navigate in room).  BP post activity 134/67.  HR 88 bpm at rest but increased to around 107 bpm with activity.  Will continue to focus on strengthening, balance, and progressive functional mobility during hospitalization.    Recommendations for follow up therapy are one component of a multi-disciplinary discharge planning process, led by the attending physician.  Recommendations may be updated based on patient status, additional functional criteria and insurance authorization.  Follow Up Recommendations  Acute inpatient rehab (3hours/day)     Assistance Recommended at Discharge Frequent or constant  Supervision/Assistance  Patient can return home with the following A little help with walking and/or transfers;A little help with bathing/dressing/bathroom;Assistance with cooking/housework;Assist for transportation;Help with stairs or ramp for entrance   Equipment Recommendations  Rolling walker (2 wheels);BSC/3in1    Recommendations for Other Services       Precautions / Restrictions Precautions Precautions: Fall Precaution Comments: s/p R frontal burr hole; monitor BP Restrictions Weight Bearing Restrictions: No     Mobility  Bed Mobility Overal bed mobility: Needs Assistance Bed Mobility: Supine to Sit, Sit to Supine     Supine to sit: Min assist, HOB elevated (assist for trunk) Sit to supine: Min assist (assist for LE's)   General bed mobility comments: vc's for technique    Transfers Overall transfer level: Needs assistance Equipment used: Rolling walker (2 wheels) Transfers: Sit to/from Stand Sit to Stand: Min assist           General transfer comment: vc's for UE placement (pt standing with B UE's placed on walker despite cueing)    Ambulation/Gait Ambulation/Gait assistance: Min guard, Min assist Gait Distance (Feet): 20 Feet Assistive device: Rolling walker (2 wheels)   Gait velocity: decreased     General Gait Details: partial step through gait pattern; intermittent assist for walker guidance   Stairs             Wheelchair Mobility    Modified Rankin (Stroke Patients Only)       Balance Overall balance assessment: Needs assistance Sitting-balance support: No upper extremity supported, Feet supported Sitting balance-Leahy Scale: Good Sitting balance - Comments: steady  sitting reaching within BOS with R UE   Standing balance support: Bilateral upper extremity supported, During functional activity Standing balance-Leahy Scale: Fair Standing balance comment: occasional assist to steady                             Cognition Arousal/Alertness: Awake/alert Behavior During Therapy: WFL for tasks assessed/performed Overall Cognitive Status: Impaired/Different from baseline                   Orientation Level: Disoriented to, Time, Situation Current Attention Level: Focused Memory: Decreased recall of precautions, Decreased short-term memory Following Commands: Follows one step commands inconsistently                Exercises      General Comments  Nursing cleared pt for participation in physical therapy.  Pt agreeable to PT session.      Pertinent Vitals/Pain Pain Assessment Pain Assessment: 0-10 Pain Score: 2  Pain Location: R frontal discomfort from incision Pain Descriptors / Indicators: Discomfort Pain Intervention(s): Limited activity within patient's tolerance, Monitored during session, Repositioned O2 on 2 L via nasal cannula stable and WFL throughout treatment session.    Home Living                          Prior Function            PT Goals (current goals can now be found in the care plan section) Acute Rehab PT Goals Patient Stated Goal: to improve mobility PT Goal Formulation: With patient/family Time For Goal Achievement: 02/19/23 Potential to Achieve Goals: Good Progress towards PT goals: Progressing toward goals    Frequency    7X/week      PT Plan Current plan remains appropriate    Co-evaluation              AM-PAC PT "6 Clicks" Mobility   Outcome Measure  Help needed turning from your back to your side while in a flat bed without using bedrails?: A Little Help needed moving from lying on your back to sitting on the side of a flat bed without using bedrails?: A Lot Help needed moving to and from a bed to a chair (including a wheelchair)?: A Little Help needed standing up from a chair using your arms (e.g., wheelchair or bedside chair)?: A Little Help needed to walk in hospital room?: A Little Help needed climbing 3-5 steps  with a railing? : A Lot 6 Click Score: 16    End of Session Equipment Utilized During Treatment: Gait belt;Oxygen (2 L O2 via nasal cannula) Activity Tolerance: Patient tolerated treatment well Patient left: in bed;with call bell/phone within reach;with bed alarm set;Other (comment) (fall mat in place) Nurse Communication: Mobility status;Precautions PT Visit Diagnosis: Other abnormalities of gait and mobility (R26.89);Muscle weakness (generalized) (M62.81);History of falling (Z91.81)     Time: [id number] PT Time Calculation (min) (ACUTE ONLY): 32 min  Charges:  $Gait Training: 8-22 mins $Therapeutic Activity: 8-22 mins                     Leitha Bleak, PT 02/06/23, 12:25 PM

## 2023-02-06 NOTE — Progress Notes (Signed)
Occupational Therapy Treatment Patient Details Name: Amanda Porter MRN: [id number] DOB: [birth year] Today's Date: 02/06/2023   History of present illness Pt is a 77 y.o. female presenting to hospital with increased AMS (unable to follow commands and uncoordinated in L arm).  H/o hydrocephalus and dementia baseline.  Recent treatments for UTI's.  Prior brain bleed secondary to falls.  Imaging showing mixed density R cerebral convexity SDH (significantly increased in size since 01/12/23) with worsened mass effect with 1.3 cm rightward midline shift.  Pt s/p R frontal burr hole for drainage of SDH and removal of VP shunt 02/03/23.  Drain removed 02/04/23.  PMH includes R VP shunt for NPH (about 5 months prior), anemia, anxiety, COPD, CVA, dementia, DM, R retinal detachment, tardive dyskinesia, R THA.   OT comments  Amanda Porter was seen for OT treatment on this date. Upon arrival to room pt supine in bed, agreeable to tx. Pt requires MIN A bed mobility. Tolerates functional reaching activity reaching across midline and outside BOS with fair balance. Focus on engaging LUE to grasp and release washcloth - attends to L hand with cues. Pt endorses headache in sitting, does not improve with time. Returned to supine, NT in for vitals. Pt making good progress toward goals, will continue to follow POC. Discharge recommendation remains appropriate.     Recommendations for follow up therapy are one component of a multi-disciplinary discharge planning process, led by the attending physician.  Recommendations may be updated based on patient status, additional functional criteria and insurance authorization.    Follow Up Recommendations  Acute inpatient rehab (3hours/day)     Assistance Recommended at Discharge Intermittent Supervision/Assistance  Patient can return home with the following  A little help with walking and/or transfers;A little help with bathing/dressing/bathroom;Help with stairs or ramp for entrance    Equipment Recommendations  BSC/3in1;Hospital bed    Recommendations for Other Services      Precautions / Restrictions Precautions Precautions: Fall Precaution Comments: s/p R frontal burr hole; monitor BP Restrictions Weight Bearing Restrictions: No       Mobility Bed Mobility Overal bed mobility: Needs Assistance Bed Mobility: Supine to Sit, Sit to Supine     Supine to sit: Min assist Sit to supine: Min guard        Transfers                   General transfer comment: pt defers citing headache     Balance Overall balance assessment: Needs assistance Sitting-balance support: No upper extremity supported, Feet supported Sitting balance-Leahy Scale: Fair                                     ADL either performed or assessed with clinical judgement   ADL Overall ADL's : Needs assistance/impaired                                       General ADL Comments: MIN A face washing sitting EOB.      Cognition Arousal/Alertness: Awake/alert Behavior During Therapy: WFL for tasks assessed/performed Overall Cognitive Status: Impaired/Different from baseline Area of Impairment: Orientation, Attention, Memory, Following commands                 Orientation Level: Disoriented to, Time, Situation   Memory: Decreased recall of precautions, Decreased short-term  memory Following Commands: Follows one step commands inconsistently                Exercises Exercises: Other exercises Other Exercises Other Exercises: tolerates functional reaching activity reaching across midline and outside BOS with fair balance. Focus on engaging LUE to grasp and release washcloth - attends to L hand with cues            Pertinent Vitals/ Pain       Pain Assessment Pain Assessment: Faces Faces Pain Scale: Hurts little more Pain Location: headache Pain Descriptors / Indicators: Discomfort, Headache Pain Intervention(s): Limited  activity within patient's tolerance   Frequency  Min 3X/week        Progress Toward Goals  OT Goals(current goals can now be found in the care plan section)  Progress towards OT goals: Progressing toward goals  Acute Rehab OT Goals Patient Stated Goal: to go home OT Goal Formulation: With patient/family Time For Goal Achievement: 02/19/23 Potential to Achieve Goals: Good ADL Goals Pt Will Perform Grooming: with modified independence;standing Pt Will Perform Lower Body Dressing: with modified independence;sit to/from stand Pt Will Transfer to Toilet: with modified independence;ambulating;regular height toilet  Plan Discharge plan remains appropriate;Frequency remains appropriate    Co-evaluation                 AM-PAC OT "6 Clicks" Daily Activity     Outcome Measure   Help from another person eating meals?: A Little Help from another person taking care of personal grooming?: A Little Help from another person toileting, which includes using toliet, bedpan, or urinal?: A Lot Help from another person bathing (including washing, rinsing, drying)?: A Lot Help from another person to put on and taking off regular upper body clothing?: A Little Help from another person to put on and taking off regular lower body clothing?: A Lot 6 Click Score: 15    End of Session    OT Visit Diagnosis: Other abnormalities of gait and mobility (R26.89);Muscle weakness (generalized) (M62.81)   Activity Tolerance Patient tolerated treatment well   Patient Left in bed;with call bell/phone within reach;with bed alarm set;with nursing/sitter in room   Nurse Communication          Time: [id number] OT Time Calculation (min): 10 min  Charges: OT General Charges $OT Visit: 1 Visit OT Treatments $Therapeutic Activity: 8-22 mins  Dessie Coma, M.S. OTR/L  02/06/23, 12:57 PM  ascom [phone]

## 2023-02-06 NOTE — Telephone Encounter (Signed)
Discussed with [name]. We will leave CT on 3/20 based on Dr Orland Penman previous message. Post op on 3/26. Amanda Porter said if she can't come in the morning as I scheduled her, it is OK to put her at 3pm that day. I also scheduled a 6 week appointment with Dr [name]. Can you let the daughter know of the CT appointment and both post op appointments? I canceled the appt with Dr [name] on 3/21. Thanks!

## 2023-02-06 NOTE — Progress Notes (Signed)
Amanda Porter at [facility] NAME: Amanda Porter    MR#:  [id number]  DATE OF BIRTH:  23-Nov-1946  SUBJECTIVE:   Seen earlier. No family at bedside Slight HA yday. Overall progressing well. Mentation much better Foley remvoed   VITALS:  Blood pressure 123/61, pulse 93, temperature 99.2 F (37.3 C), resp. rate 18, height '5\' 2"'$  (1.575 m), weight 46.3 kg, SpO2 100 %.  PHYSICAL EXAMINATION:   GENERAL:  77 y.o.-year-old patient with no acute distress.  LUNGS: Normal breath sounds bilaterally, no wheezing CARDIOVASCULAR: S1, S2 normal. No murmur   ABDOMEN: Soft, nontender, nondistended. Bowel sounds present.  EXTREMITIES: No  edema b/l.    NEUROLOGIC: nonfocal  patient is awake and alert SKIN: No obvious rash, lesion, or ulcer.   LABORATORY PANEL:  CBC Recent Labs  Lab 02/05/23 0549  WBC 8.2  HGB 8.0*  HCT 26.8*  PLT 378     Chemistries  Recent Labs  Lab 02/03/23 1929 02/04/23 0056 02/05/23 0549  NA 136   < > 137  K 4.2   < > 3.9  CL 101   < > 101  CO2 25   < > 29  GLUCOSE 150*   < > 128*  BUN 20   < > 18  CREATININE 0.72   < > 0.62  CALCIUM 9.1   < > 9.0  MG  --    < > 1.9  AST 28  --   --   ALT 15  --   --   ALKPHOS 63  --   --   BILITOT 0.4  --   --    < > = values in this interval not displayed.    Assessment and Plan Amanda Porter is a 77 y.o. adult who comes in with concern for altered mental status.  Patient has a history of hydrocephalus and dementia at baseline but reports worsening altered mental status she is unable to follow commands and uncoordinated in her left arm patient has had some recent treatments for UTIs with 2 courses of different antibiotics.  Is also had prior brain bleed secondary to falls.    Per ER--Daughter reports that she has had acute change over the past 24 to 48 hours where she seems more altered and confused.  She seems to be having more hallucinations.  She is typically able to transfer and  she is often able to do that.  She goes to an adult daycare but unfortunately she seemed to decline and they were concerned so that also was going on and recommended she come into the ER to be evaluated.  She did last have her hydrocephalus and VP shunt evaluation and fixed by Dr. [name] a few months ago  CT head without contrast 02/03/2023: Mixed density right cerebral convexity subdural hematoma is significantly increased in size since 01/12/2023, currently measuring up to 2.4 cm with worsened mass effect with 1.3 cm rightward midline shift.  Stable position of right frontal ventricular catheter without evidence of hydrocephalus or trapped ventricle.   Worsening Subdural Hematoma with mass effect and rightward shift requiring emergent drain placement and VP shunt removal due to shunt malfunction - Neurosurgery consulted, appreciate input--s/p Shunt removal and drain placement post subdural now remvoed -  OK to start PT/OT, remove foley and start DVT prophylaxis per Dr Adonis Housekeeper - resumed Asa (81 mg)  Chronic COPD without exacerbation PMHx: Lung Cancer s/p upper Left Lobectomy, former smoker -  Supplemental oxygen as needed, maintain SpO2 > 88% - Bronchodilators as needed   Acute Encephalopathy secondary to worsening subdural hematoma in the setting of baseline dementia Anxiety & Depression - restart outpatient memantine & melatonin on 3/6 -hold Buspar & klonopin for now to avoid sedating and if remains stable resumed 3/8   Hypertension Hyperlipidemia - restart outpatient Crestor - consider restarting losartan as the patient stabilizes   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 Pt will benefit for Acute inpatient rehab. TOC for d/c planning     Procedures:subdural drain + and removal of VP shunt Family communication :dter Paediatric nurse Consults :Neurosurgery, PCCM CODE STATUS:  DNR (POA) DVT Prophylaxis :lovenox Level of care: Med-Surg Status is: Inpatient Remains inpatient appropriate because: awaiting CIR eval and d/c planning     TOTAL TIME TAKING CARE OF THIS PATIENT: 35 minutes.  >50% time spent on counselling and coordination of care  Note: This dictation was prepared with Dragon dictation along with smaller phrase technology. Any transcriptional errors that result from this process are unintentional.  [name] M.D    [facility]   CC: Primary care physician; Pleas Koch, NP

## 2023-02-06 NOTE — Progress Notes (Cosign Needed)
Spoke with patient's daughter. They have not returned the UA due to her mother being in the hospital.  Charlene Brooke, PharmD notified  Marijean Niemann, [place] Assistant [phone]

## 2023-02-06 NOTE — Progress Notes (Signed)
Daughter Amanda Porter updated by this RN that patient moving to room 117. Unit phone number provided. All belongings sent with patient including upper and lower dentures.

## 2023-02-07 DIAGNOSIS — T8501XS Breakdown (mechanical) of ventricular intracranial (communicating) shunt, sequela: Secondary | ICD-10-CM | POA: Diagnosis not present

## 2023-02-07 DIAGNOSIS — I10 Essential (primary) hypertension: Secondary | ICD-10-CM | POA: Diagnosis not present

## 2023-02-07 DIAGNOSIS — R4182 Altered mental status, unspecified: Secondary | ICD-10-CM | POA: Diagnosis not present

## 2023-02-07 DIAGNOSIS — S065XAA Traumatic subdural hemorrhage with loss of consciousness status unknown, initial encounter: Secondary | ICD-10-CM | POA: Diagnosis not present

## 2023-02-07 LAB — GLUCOSE, CAPILLARY
Glucose-Capillary: 164 mg/dL — ABNORMAL HIGH (ref 70–99)
Glucose-Capillary: 138 mg/dL — ABNORMAL HIGH (ref 70–99)
Glucose-Capillary: 188 mg/dL — ABNORMAL HIGH (ref 70–99)
Glucose-Capillary: 222 mg/dL — ABNORMAL HIGH (ref 70–99)
Glucose-Capillary: 121 mg/dL — ABNORMAL HIGH (ref 70–99)

## 2023-02-07 MED ORDER — GLIPIZIDE ER 2.5 MG PO TB24
2.5000 mg | ORAL_TABLET | Freq: Every day | ORAL | Status: DC
  Administered 2023-02-08 – 2023-02-13 (×6): 2.5 mg via ORAL
  Filled 2023-02-07 (×6): qty 1

## 2023-02-07 MED ORDER — INSULIN ASPART 100 UNIT/ML IJ SOLN: 0.0000 [IU] | Freq: Every day | INTRAMUSCULAR | Status: DC

## 2023-02-07 MED ORDER — INSULIN ASPART 100 UNIT/ML IJ SOLN
0.0000 [IU] | Freq: Three times a day (TID) | INTRAMUSCULAR | Status: DC
  Administered 2023-02-07: 3 [IU] via SUBCUTANEOUS
  Administered 2023-02-07 – 2023-02-08 (×2): 2 [IU] via SUBCUTANEOUS
  Administered 2023-02-08 – 2023-02-09 (×2): 3 [IU] via SUBCUTANEOUS
  Administered 2023-02-09: 2 [IU] via SUBCUTANEOUS
  Administered 2023-02-10: 3 [IU] via SUBCUTANEOUS
  Administered 2023-02-10 – 2023-02-12 (×6): 2 [IU] via SUBCUTANEOUS
  Administered 2023-02-12 – 2023-02-13 (×2): 1 [IU] via SUBCUTANEOUS
  Administered 2023-02-13: 2 [IU] via SUBCUTANEOUS
  Filled 2023-02-07 (×14): qty 1

## 2023-02-07 NOTE — Progress Notes (Signed)
Physical Therapy Treatment Patient Details Name: Amanda Porter MRN: [id number] DOB: [birth year] Today's Date: 02/07/2023   History of Present Illness Pt is a 77 y.o. female presenting to hospital with increased AMS (unable to follow commands and uncoordinated in L arm).  H/o hydrocephalus and dementia baseline.  Recent treatments for UTI's.  Prior brain bleed secondary to falls.  Imaging showing mixed density R cerebral convexity SDH (significantly increased in size since 01/12/23) with worsened mass effect with 1.3 cm rightward midline shift.  Pt s/p R frontal burr hole for drainage of SDH and removal of VP shunt 02/03/23.  Drain removed 02/04/23.  PMH includes R VP shunt for NPH (about 5 months prior), anemia, anxiety, COPD, CVA, dementia, DM, R retinal detachment, tardive dyskinesia, R THA.    PT Comments    Pt increased gait distance a little today with RW (30f min A to min guard).  Pt requires cues for left side awareness, attention to task, and  RW management throughout gait, bed mobility, and transfers.  Pt did seem to have increased difficulty today with shifting weight forward from sit<>stand but after a couple of steps was and to bring weight forward.   Current PT D/C plan remains appropriate.  Recommendations for follow up therapy are one component of a multi-disciplinary discharge planning process, led by the attending physician.  Recommendations may be updated based on patient status, additional functional criteria and insurance authorization.  Follow Up Recommendations  Acute inpatient rehab (3hours/day)     Assistance Recommended at Discharge Frequent or constant Supervision/Assistance  Patient can return home with the following A little help with walking and/or transfers;A little help with bathing/dressing/bathroom;Assistance with cooking/housework;Assist for transportation;Help with stairs or ramp for entrance   Equipment Recommendations  Rolling walker (2 wheels);BSC/3in1     Recommendations for Other Services       Precautions / Restrictions Precautions Precautions: Fall Precaution Comments: s/p R frontal burr hole; monitor BP Restrictions Weight Bearing Restrictions: No     Mobility  Bed Mobility Overal bed mobility: Needs Assistance Bed Mobility: Supine to Sit, Sit to Supine     Supine to sit: Min assist Sit to supine: Min guard   General bed mobility comments: vc's for technique    Transfers Overall transfer level: Needs assistance Equipment used: Rolling walker (2 wheels) Transfers: Sit to/from Stand Sit to Stand: Mod assist           General transfer comment: heavy posterior lean with initial sit to stand but decreased after pt took a few steps.    Ambulation/Gait Ambulation/Gait assistance: Min guard, Min assist Gait Distance (Feet): 30 Feet Assistive device: Rolling walker (2 wheels) Gait Pattern/deviations: Shuffle, Trunk flexed Gait velocity: decreased     General Gait Details: partial step through gait pattern; intermittent assist for walker guidance   Stairs             Wheelchair Mobility    Modified Rankin (Stroke Patients Only)       Balance Overall balance assessment: Needs assistance Sitting-balance support: No upper extremity supported, Feet supported Sitting balance-Leahy Scale: Fair Sitting balance - Comments: steady sitting reaching outside BOS with slow R UE reaching movments Postural control: Right lateral lean Standing balance support: Bilateral upper extremity supported, During functional activity Standing balance-Leahy Scale: Fair Standing balance comment: occasional assist to steady                            Cognition  Arousal/Alertness: Awake/alert Behavior During Therapy: WFL for tasks assessed/performed Overall Cognitive Status: Impaired/Different from baseline Area of Impairment: Orientation, Attention, Memory, Following commands                 Orientation  Level: Disoriented to, Time, Situation Current Attention Level: Focused Memory: Decreased recall of precautions, Decreased short-term memory Following Commands: Follows one step commands inconsistently       General Comments: increased time to respond to questions/cues. Pt attempting to occasionally incorporate use of non-dominant LUE into activites with cueing        Exercises      General Comments        Pertinent Vitals/Pain Pain Assessment Pain Assessment: Faces Faces Pain Scale: Hurts little more Pain Location: headache Pain Descriptors / Indicators: Discomfort, Headache Pain Intervention(s): Monitored during session    Home Living                          Prior Function            PT Goals (current goals can now be found in the care plan section) Acute Rehab PT Goals Patient Stated Goal: to improve mobility PT Goal Formulation: With patient/family Time For Goal Achievement: 02/19/23 Potential to Achieve Goals: Good Progress towards PT goals: Progressing toward goals    Frequency    7X/week      PT Plan Current plan remains appropriate    Co-evaluation              AM-PAC PT "6 Clicks" Mobility   Outcome Measure  Help needed turning from your back to your side while in a flat bed without using bedrails?: A Little Help needed moving from lying on your back to sitting on the side of a flat bed without using bedrails?: A Lot Help needed moving to and from a bed to a chair (including a wheelchair)?: A Little Help needed standing up from a chair using your arms (e.g., wheelchair or bedside chair)?: A Little Help needed to walk in hospital room?: A Little Help needed climbing 3-5 steps with a railing? : A Lot 6 Click Score: 16    End of Session Equipment Utilized During Treatment: Gait belt;Oxygen Activity Tolerance: Patient tolerated treatment well Patient left: with call bell/phone within reach;in chair;with nursing/sitter in  room;with chair alarm set Nurse Communication: Mobility status PT Visit Diagnosis: Other abnormalities of gait and mobility (R26.89);Muscle weakness (generalized) (M62.81);History of falling (Z91.81)     Time: [id number] PT Time Calculation (min) (ACUTE ONLY): 23 min  Charges:  $Gait Training: 8-22 mins $Therapeutic Activity: 8-22 mins                     Bjorn Loser, PTA  02/07/23, 12:16 PM

## 2023-02-07 NOTE — Progress Notes (Signed)
Amanda Porter at [facility] NAME: Amanda Porter    MR#:  [id number]  DATE OF BIRTH:  [birth year]  SUBJECTIVE:   Seen earlier. No family at bedside Overall progressing well. Mentation much better Foley removeded   VITALS:  Blood pressure 135/68, pulse 95, temperature 98.3 F (36.8 C), temperature source Oral, resp. rate 18, height '5\' 2"'$  (1.575 m), weight 47.6 kg, SpO2 100 %.  PHYSICAL EXAMINATION:   GENERAL:  77 y.o.-year-old patient with no acute distress.  LUNGS: Normal breath sounds bilaterally, no wheezing CARDIOVASCULAR: S1, S2 normal. No murmur   ABDOMEN: Soft, nontender, nondistended. Bowel sounds present.  EXTREMITIES: No  edema b/l.    NEUROLOGIC: nonfocal  patient is awake and alert, Scalp incision looks good SKIN: No obvious rash, lesion, or ulcer.   LABORATORY PANEL:  CBC Recent Labs  Lab 02/05/23 0549  WBC 8.2  HGB 8.0*  HCT 26.8*  PLT 378     Chemistries  Recent Labs  Lab 02/03/23 1929 02/04/23 0056 02/05/23 0549  NA 136   < > 137  K 4.2   < > 3.9  CL 101   < > 101  CO2 25   < > 29  GLUCOSE 150*   < > 128*  BUN 20   < > 18  CREATININE 0.72   < > 0.62  CALCIUM 9.1   < > 9.0  MG  --    < > 1.9  AST 28  --   --   ALT 15  --   --   ALKPHOS 63  --   --   BILITOT 0.4  --   --    < > = values in this interval not displayed.    Assessment and Plan Amanda Porter is a 77 y.o. adult who comes in with concern for altered mental status.  Patient has a history of hydrocephalus and dementia at baseline but reports worsening altered mental status she is unable to follow commands and uncoordinated in her left arm patient has had some recent treatments for UTIs with 2 courses of different antibiotics.  Is also had prior brain bleed secondary to falls.    Per ER--Daughter reports that she has had acute change over the past 24 to 48 hours where she seems more altered and confused.  She seems to be having more hallucinations.   She is typically able to transfer and she is often able to do that.  She goes to an adult daycare but unfortunately she seemed to decline and they were concerned so that also was going on and recommended she come into the ER to be evaluated.  She did last have her hydrocephalus and VP shunt evaluation and fixed by Dr. [name] a few months ago  CT head without contrast 02/03/2023: Mixed density right cerebral convexity subdural hematoma is significantly increased in size since 01/12/2023, currently measuring up to 2.4 cm with worsened mass effect with 1.3 cm rightward midline shift.  Stable position of right frontal ventricular catheter without evidence of hydrocephalus or trapped ventricle.   Worsening Subdural Hematoma with mass effect and rightward shift requiring emergent drain placement and VP shunt removal due to shunt malfunction - Neurosurgery consulted, appreciate input--s/p Shunt removal and drain placement post subdural now remvoed -  OK to start PT/OT, remove foley and start DVT prophylaxis per Dr Adonis Housekeeper - resumed Asa (81 mg) --Neurosurgery ok with d/c when bed available.--signed off  Chronic COPD  without exacerbation PMHx: Lung Cancer s/p upper Left Lobectomy, former smoker - Supplemental oxygen as needed, maintain SpO2 > 88% - Bronchodilators as needed   Acute Encephalopathy secondary to worsening subdural hematoma in the setting of baseline dementia Anxiety & Depression - restart outpatient memantine & melatonin on 3/6 -on Buspar & klonopin   Hypertension Hyperlipidemia - restart outpatient Crestor - consider restarting losartan as the patient stabilizes   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 Pt will benefit for Acute inpatient rehab. TOC for d/c planning. Awaiting insurance auth for inpt rehab. Seen by Dr Alexander Mt     Procedures:subdural drain + and  removal of VP shunt Family communication :dter [name] Consults :Neurosurgery, PCCM CODE STATUS: DNR (POA) DVT Prophylaxis :lovenox Level of care: Med-Surg Status is: Inpatient Remains inpatient appropriate because: awaiting CIR eval and d/c planning     TOTAL TIME TAKING CARE OF THIS PATIENT: 35 minutes.  >50% time spent on counselling and coordination of care  Note: This dictation was prepared with Dragon dictation along with smaller phrase technology. Any transcriptional errors that result from this process are unintentional.  [name] M.D    [facility]   CC: Primary care physician; Pleas Koch, NP

## 2023-02-07 NOTE — Progress Notes (Signed)
Progress Note  History: Amanda Porter is s/p right frontal burr hole for SDH drainage and removal of VP shunt   POD4: No complaints POD3: Pt denying headache this morning POD2: Pt back to cognitive baseline. Denies any headaches this morning and is looking forward to breakfast POD1: Pt appears to be at neurologic baseline. Left sided weakness improving   Physical Exam: Vitals:   02/07/23 0444 02/07/23 0740  BP: 139/68   Pulse: 88   Resp: 16   Temp: 98.4 F (36.9 C)   SpO2: 93% 95%    Oriented x 2 MAEW except limited movement in LUE due to baseline frozen shoulder  Incision c/d/I with staples in place  Data:  Other tests/results:  CT head 02/04/23 Narrative & Impression  CLINICAL DATA:  77 year old female with increased right side subdural hematoma. Postoperative day 1 status post right frontal burr hole drainage of subdural hematoma. History of right side CSF shunt.   EXAM: CT HEAD WITHOUT CONTRAST   TECHNIQUE: Contiguous axial images were obtained from the base of the skull through the vertex without intravenous contrast.   RADIATION DOSE REDUCTION: This exam was performed according to the departmental dose-optimization program which includes automated exposure control, adjustment of the mA and/or kV according to patient size and/or use of iterative reconstruction technique.   COMPARISON:  Head CT yesterday and earlier.   FINDINGS: Brain: Right frontal approach CSF shunt, reservoir and subcutaneous catheter have been removed. New right superior vertex burr hole and percutaneous subdural drain placed.   Subtotal drainage of the mixed density right subdural hematoma since yesterday. But postoperative gas now within the non collapsed right side subdural space. Trace residual posterior convexity subdural fluid adjacent to the indwelling drain on series 3, image 20.   Small volume right frontal horn pneumo ventricle and trace left layering IVH (series 3, image 18)  following CSF shunt removal.   Decreased intracranial mass effect. Less mass effect on the right lateral ventricle. Leftward midline shift has decreased to 6-7 mm (previously up to 13 mm). Normalized suprasellar cistern. Other basilar cisterns are patent. No evidence of trapped ventricle.   Chronic right thalamic lacunar infarct. Patchy and confluent cerebral white matter hypodensity. No cortically based acute infarct identified.   Vascular: Calcified atherosclerosis at the skull base.   Skull: New right vertex burr hole superimposed on previous shunt related burr hole. Elsewhere the skull appears intact.   Sinuses/Orbits: Visualized paranasal sinuses and mastoids are stable and well aerated.   Other: New postoperative changes to the right scalp vertex. Skin staples in place. Orbits appear negative.   IMPRESSION: 1. Postoperative subtotal drainage of the mixed density right subdural fluid. Postoperative gas now occupying the non-collapsed right subdural space, with percutaneous subdural drain in place. 2. Small volume pneumo-ventricle and trace dependent intraventricular blood following CSF shunt removal. Improved lateral ventricle patency and regressed leftward midline shift (now 6-7 mm). 3. Normalized basilar cisterns. Chronic cerebral small vessel disease.     Electronically Signed   By: Genevie Ann M.D.   On: 02/04/2023 06:08    Assessment/Plan:  Amanda Porter is a 77 y.o presenting with AMS and weakness s/p right burr hole for evacuation of SDH.   - pt ok for DVT prophylaxis  - drain removed 02/04/23 - remainder of care per internal medicine  Meade Maw MD Department of Neurosurgery

## 2023-02-07 NOTE — PMR Pre-admission (Signed)
PMR Admission Coordinator Pre-Admission Assessment  Patient: Amanda Porter is an 77 y.o., female MRN: [id number] DOB: Nov 23, 1946 Height: 5\' 2"  (157.5 cm) Weight: 41.3 kg              Insurance Information HMO:     PPO: yes     PCP:      IPA:      80/20:      OTHER:  PRIMARY: [employer]      Policy#: [id number]      Subscriber: patient CM Name: Dawson Bills      Phone#: [phone] Ext [phone]     Fax#: [phone] Pre-Cert#: [id number]  approved for 7 days 02/13/23 until 03/01/23 f/u with Edwena Felty phone [phone] ext [phone] fax [phone]    Employer:  Benefits:  Phone #: online at [email or web address]     Name:  [place]. Date: 12/02/19-still active     Deduct: does not have   Out of Pocket Max: $4,000      Life Max: NA  CIR: $160/day co-pay with a max co-pay of $1,600/admission      SNF: 100% coverage for days 1-20, $50/day co-pay for days 21-100 Outpatient: $20/visit co-pay     Co-Pay:  Home Health: 100% coverage      Co-Pay:  DME: 80% coverage     Co-Pay: 20% co-insurance Providers: in-network SECONDARY:       Policy#:       Phone#:   Development worker, community:       Phone#:   The Engineer, petroleum" for patients in Inpatient Rehabilitation Facilities with attached "Privacy Act [place] Records" was provided and verbally reviewed with: Family  Emergency Contact Information Contact Information     Name Relation Home Work Mobile   Lawson Daughter [phone]     [name] [phone]  [phone]   [name] [phone]  [phone]      Current Medical History  Patient Admitting Diagnosis: SDH  History of Present Illness:  77 year old right-handed female with history of COPD with history of lung cancer status post upper left lobectomy, former smoker,, chronic anemia, anxiety/depression/dementia maintained on Namenda, CVA with left-sided deficits, diabetes mellitus type 2, hyperlipidemia, hypertension, tardive  dyskinesia, recurrent UTIs, dementia, right THA 09/07/2022 per Dr. Harlow Mares, right VP shunt replacement for NPH about 5 months ago per Dr. Meade Maw. Patient attends adult daycare.   Dating back to December patient was modified independent ambulate with a rollator with steady decline since January requiring assist for transfers ambulating only short distances and family also reports increased confusion.   Presented to [facility] 02/03/2023 with increasing confusion and gait disorder.    Cranial CT scan showed mixed density right cerebral convexity subdural hematoma significantly increased in size since 01/12/2023 currently measuring up to 2.4 cm with worsening mass effect and 1.3 cm rightward midline shift.  Stable position of right frontal ventricular catheter without evidence of hydrocephalus or trapped ventricle.  Patient underwent right frontal burr hole for drainage of subdural hematoma 02/03/2023 per Dr. [name] and VP shunt removed.  Latest cranial CT scan 02/09/2023 shows decreased size of right subdural collection now 6 mm decreased right to left midline shift now 4 mm.  Hospital course patient was cleared to resume low-dose aspirin.  Lovenox was added for DVT prophylaxis 02/05/2023.  Patient is tolerating a regular consistency diet.  Patient with chronic anemia with hemoglobin dating back to 12/26/2022 of 8.7 currently 8.0. Palliative care consulted to establish goals of care.   Patient's medical [facility] from  [facility] has been reviewed by the rehabilitation admission coordinator and physician.  Past Medical History  Past Medical History:  Diagnosis Date   Acute respiratory failure requiring reintubation ([place]) 11/05/2021   Anemia    Anxiety    Anxiety and depression    COPD (chronic obstructive pulmonary disease) (HCC)    CVA (cerebral vascular accident) ([place])    CVA (cerebral vascular accident) ([place])    L sided deficits   Dementia ([place])    Depression    Diabetes mellitus  ([place])    Type 2   Diabetes mellitus without complication ([place])    Dysphagia    Dysphasia    Falls    GERD (gastroesophageal reflux disease)    HCAP (healthcare-associated pneumonia) 12/16/2022   History of lung cancer    Hyperlipidemia    Hypertension    Influenza A 12/13/2021   Memory disturbance 11/20/2017   Normal pressure hydrocephalus ([place]) 2018   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   Has the patient had major surgery during 100 days prior to admission? Yes  Family History  family history includes Cancer in her brother, brother, and father; Diabetes in her sister and sister; Heart disease in her sister; Hyperlipidemia in her sister; Hypertension in her sister and sister; Pancreatic cancer in her sister; Pneumonia in her mother; Throat cancer in her brother.  Current Medications   Current Facility-Administered Medications:    acetaminophen (TYLENOL) tablet 650 mg, 650 mg, Oral, Q4H PRN, Rust-Chester, Britton L, NP, 650 mg at 02/12/23 0926   albuterol (PROVENTIL) (2.5 MG/3ML) 0.083% nebulizer solution 2.5 mg, 2.5 mg, Nebulization, Q4H PRN, Rust-Chester, Britton L, NP   amLODipine (NORVASC) tablet 2.5 mg, 2.5 mg, Oral, Daily, Posey Pronto, Sona, MD, 2.5 mg at 02/13/23 0742   arformoterol (BROVANA) nebulizer solution 15 mcg, 15 mcg, Nebulization, BID, 15 mcg at 02/13/23 0729 **AND** umeclidinium bromide (INCRUSE ELLIPTA) 62.5 MCG/ACT 1 puff, 1 puff, Inhalation, Daily, Rust-Chester, Britton L, NP, 1 puff at 02/13/23 [id number]   ascorbic acid (VITAMIN C) tablet 500 mg, 500 mg, Oral, Daily, [name], MD, 500 mg at 02/13/23 [id number]   aspirin chewable tablet 81 mg, 81 mg, Oral, Daily, [name], MD, 81 mg at 02/13/23 [id number]   buPROPion (WELLBUTRIN XL) 24 hr tablet 300 mg, 300  mg, Oral, Daily, [name], MD, 300 mg at 02/13/23 0825   busPIRone (BUSPAR) tablet 30 mg, 30 mg, Oral, BID, [name], MD, 30 mg at 02/13/23 [id number]   Chlorhexidine Gluconate Cloth 2 % PADS 6 each, 6 each, Topical, Daily, [name], MD, 6 each at 02/13/23 0933   docusate sodium (COLACE) capsule 100 mg, 100 mg, Oral, BID PRN, Rust-Chester, Britton L, NP, 100 mg at 02/13/23 0742   enoxaparin (LOVENOX) injection 30 mg, 30 mg, Subcutaneous, Q24H, [name], MD, 30 mg at 02/12/23 2147   feeding supplement (ENSURE ENLIVE / ENSURE PLUS) liquid 237 mL, 237 mL, Oral, BID BM, [name], MD, 237 mL at 02/13/23 0933   ferrous sulfate tablet 325 mg, 325 mg, Oral, Q breakfast, [name], MD, 325 mg at [id number]   folic acid (FOLVITE) tablet 1 mg, 1 mg, Oral, Daily, [name], MD, 1 mg at 02/13/23 [phone]  glipiZIDE (GLUCOTROL XL) 24 hr tablet 2.5 mg, 2.5 mg, Oral, Q breakfast, Posey Pronto, Sona, MD, 2.5 mg at 02/13/23 0741   hydrocortisone cream 0.5 %, , Topical, BID, Wynelle Cleveland, [facility], [place], Given at 02/13/23 [id number]   insulin aspart (novoLOG) injection 0-5 Units, 0-5 Units, Subcutaneous, QHS, [name], MD   insulin aspart (novoLOG) injection 0-9 Units, 0-9 Units, Subcutaneous, TID WC, [name], MD, 1 Units at 02/13/23 0825   labetalol (NORMODYNE) injection 10 mg, 10 mg, Intravenous, Q2H PRN, Rust-Chester, Toribio Harbour L, NP   lactobacillus (FLORANEX/LACTINEX) granules 1 g, 1 g, Oral, Daily, [name], MD, 1 g at 02/13/23 0743   melatonin tablet 10 mg, 10 mg, Oral, QHS, Rust-Chester, Britton L, NP, 10 mg at 02/12/23 2147   memantine (NAMENDA) tablet 5 mg, 5 mg, Oral, BID, Rust-Chester, Britton L, NP, 5 mg at 02/13/23 [id number]   multivitamin with minerals tablet 1 tablet, 1 tablet, Oral, Daily, [name], MD, 1 tablet at 02/13/23 0742   mupirocin ointment (BACTROBAN) 2 %, , Nasal, BID, Flora Lipps, MD, Given at 02/13/23 0743   pantoprazole (PROTONIX) EC tablet 40 mg, 40 mg, Oral, QHS, Benita Gutter,  RPH, 40 mg at 02/12/23 2147   polyethylene glycol (MIRALAX / GLYCOLAX) packet 17 g, 17 g, Oral, Daily PRN, Rust-Chester, Toribio Harbour L, NP   rosuvastatin (CRESTOR) tablet 20 mg, 20 mg, Oral, QPM, Rust-Chester, Britton L, NP, 20 mg at 02/12/23 1743  Patients Current Diet:  Diet Order             Diet - low sodium heart healthy           Diet Carb Modified Fluid consistency: Thin; Room service appropriate? Yes  Diet effective now                  Precautions / Restrictions Precautions Precautions: Fall Precaution Comments: s/p R frontal burr hole; monitor BP Restrictions Weight Bearing Restrictions: No   Has the patient had 2 or more falls or a fall with injury in the past year?Yes  Prior Activity Level Limited Community (1-2x/wk): goes to adult day care 3days/week  Prior Functional Level Prior Function Prior Level of Function : Needs assist Mobility Comments: In December pt was modified independent ambulating with rollator but has had decline in status since January requiring assist for transfers and walking 10-20 feet (and 40-50 feet when going to adult day care).  Self Care: Did the patient need help bathing, dressing, using the toilet or eating?  Needed some help  Indoor Mobility: Did the patient need assistance with walking from room to room (with or without device)? Independent  Stairs: Did the patient need assistance with internal or external stairs (with or without device)? Needed some help  Functional Cognition: Did the patient need help planning regular tasks such as shopping or remembering to take medications? Needed some help  Patient Information Are you of Hispanic, Latino/a,or Spanish origin?: X. Patient unable to respond, A. No, not of Hispanic, Latino/a, or Spanish origin What is your race?: X. Patient unable to respond, B. Black or African American Do you need or want an interpreter to communicate with a doctor or health care staff?: 9. Unable to  respond  Patient's Response To:  Health Literacy and Transportation Is the patient able to respond to health literacy and transportation needs?: No Health Literacy - How often do you need to have someone help you when you read instructions, pamphlets, or other written material from your doctor or pharmacy?:  (  daughter reported she reads medical material for pt) In the past 12 months, has lack of transportation kept you from medical appointments or from getting medications?:  (daughter reports pt has not missed any medical appointments due to transportation) In the past 12 months, has lack of transportation kept you from meetings, work, or from getting things needed for daily living?:  (daughter reports pt has not missed any non-medical appointments d/t transportation)  Development worker, international aid / Equipment Home Equipment: Rollator (4 wheels), Shower seat, Grab bars - tub/shower, Toilet riser, Wheelchair - manual  Prior Device Use: Indicate devices/aids used by the patient prior to current illness, exacerbation or injury? Walker  Current Functional Level Cognition  Overall Cognitive Status: Impaired/Different from baseline Current Attention Level: Focused Orientation Level: Oriented to person, Oriented to place, Oriented to situation, Disoriented to time Following Commands: Follows one step commands inconsistently Safety/Judgement: Decreased awareness of safety, Decreased awareness of deficits General Comments: cues to initiate mobility, improved with increased alertness with activity.    Extremity Assessment (includes Sensation/Coordination)  Upper Extremity Assessment: Difficult to assess due to impaired cognition, RUE deficits/detail RUE Deficits / Details: H/o L frozen shoulder with decreased ROM baseline.  Lower Extremity Assessment: Difficult to assess due to impaired cognition    ADLs  Overall ADL's : Needs assistance/impaired Grooming: Wash/dry face, Sitting,  Supervision/safety Grooming Details (indicate cue type and reason): intermittent vcs for thoroughness General ADL Comments: MIN A for simulated BSC t/f, verbal and tactile cues to sequence steps along EOB. MIN A tooth brushing seated EOB, initial fair righting ractions however as pt fatigues requires MIN A sitting balance (R lateral lean).    Mobility  Overal bed mobility: Needs Assistance Bed Mobility: Supine to Sit, Sit to Supine Supine to sit: Mod assist Sit to supine: Min guard General bed mobility comments: NT in recliner pre/post session    Transfers  Overall transfer level: Needs assistance Equipment used: 1 person hand held assist, Rolling walker (2 wheels) Transfers: Sit to/from Stand (5x with RW with MIN-MOD A; 5x with MIN A; pt able to sustain  static standing for no more than 20 seconds at attempts) Sit to Stand: Min assist General transfer comment: x5 sit<>stand with cues + MIN a to initiate lift off    Ambulation / Gait / Stairs / Wheelchair Mobility  Ambulation/Gait Ambulation/Gait assistance: Min guard, Min assist Gait Distance (Feet):  (55) Assistive device: Rolling walker (2 wheels) Gait Pattern/deviations: Step-to pattern, Decreased step length - right, Decreased step length - left (trunk and upper body rotated to right during gait) General Gait Details: Pt required assist to stay on task and keep RW moving. Gait velocity: decreased    Posture / Balance Dynamic Sitting Balance Sitting balance - Comments: steady sitting reaching outside BOS with slow R UE reaching movments Balance Overall balance assessment: Needs assistance Sitting-balance support: Single extremity supported, Feet supported Sitting balance-Leahy Scale: Fair Sitting balance - Comments: steady sitting reaching outside BOS with slow R UE reaching movments Postural control: Right lateral lean Standing balance support: Bilateral upper extremity supported, During functional activity Standing  balance-Leahy Scale: Fair Standing balance comment: min gaurd/assist today with most tasks.  constant verbal and tactile cues needed.    Special needs/care consideration Palliative Care involved DNR on acute hospital   Previous Home Environment  Living Arrangements: Children, Other (Comment) (granddaughter)  Lives With: Daughter, Other (Comment) (granddaughter) Available Help at Discharge: Family, Available PRN/intermittently (daughter arranging 24/7 support) Type of Home: House Home Layout: Able to  live on main level with bedroom/bathroom Home Access: Stairs to enter Entrance Stairs-Rails: None Entrance Stairs-Number of Steps: 1 Bathroom Shower/Tub: Chiropodist: Handicapped height Bathroom Accessibility: Yes How Accessible: Accessible via walker Home Care Services: Yes Type of Home Care Services: Home OT, Home PT, Home RN, Other (Comment) (Home ST) Miles (if known): [facility] Additional Comments: lives with daughter (works full time) and grand child (autistic)  Discharge Living Setting Plans for Discharge Living Setting: Patient's home Type of Home at Discharge: House Discharge Home Layout: Able to live on main level with bedroom/bathroom Discharge Home Access: Stairs to enter Entrance Stairs-Rails: None Entrance Stairs-Number of Steps: 1 Discharge Bathroom Shower/Tub: Tub/shower unit Discharge Bathroom Toilet: Handicapped height Discharge Bathroom Accessibility: Yes How Accessible: Accessible via walker Does the patient have any problems obtaining your medications?: No  Social/Family/Support Systems Anticipated Caregiver: Rayetta Talluto, daughter Anticipated Caregiver's Contact Information: [phone] Caregiver Availability: Intermittent (daughter arranging 24/7 support) Discharge Plan Discussed with Primary Caregiver: Yes Is Caregiver In Agreement with Plan?: Yes Does Caregiver/Family have Issues with Lodging/Transportation while Pt is in  Rehab?: No  Goals Patient/Family Goal for Rehab: Supervision: PT/OT/ST Expected length of stay: 10-14 days Pt/Family Agrees to Admission and willing to participate: Yes Program Orientation Provided & Reviewed with Pt/Caregiver Including Roles  & Responsibilities: Yes  Decrease burden of Care through IP rehab admission: NA  Possible need for SNF placement upon discharge:Not anticipated  Patient Condition: This patient's medical and functional status has changed since the consult dated: 02/06/23 in which the Rehabilitation Physician determined and documented that the patient's condition is appropriate for intensive rehabilitative care in an inpatient rehabilitation facility. See "History of Present Illness" (above) for medical update. Functional changes are: overall min assist. Patient's medical and functional status update has been discussed with the Rehabilitation physician and patient remains appropriate for inpatient rehabilitation. Will admit to inpatient rehab today.  Preadmission Screen Completed By:  Bethel Born, CCC-SLP, 02/13/2023 10:23 AM ______________________________________________________________________   Discussed status with Dr. Tressa Busman on 02/13/2023 at 38 and received approval for admission today.  Admission Coordinator:  Bethel Born, time 1022 Date 02/13/2023

## 2023-02-08 DIAGNOSIS — T8501XS Breakdown (mechanical) of ventricular intracranial (communicating) shunt, sequela: Secondary | ICD-10-CM | POA: Diagnosis not present

## 2023-02-08 DIAGNOSIS — R4182 Altered mental status, unspecified: Secondary | ICD-10-CM | POA: Diagnosis not present

## 2023-02-08 DIAGNOSIS — S065XAA Traumatic subdural hemorrhage with loss of consciousness status unknown, initial encounter: Secondary | ICD-10-CM | POA: Diagnosis not present

## 2023-02-08 DIAGNOSIS — I10 Essential (primary) hypertension: Secondary | ICD-10-CM | POA: Diagnosis not present

## 2023-02-08 LAB — GLUCOSE, CAPILLARY
Glucose-Capillary: 162 mg/dL — ABNORMAL HIGH (ref 70–99)
Glucose-Capillary: 216 mg/dL — ABNORMAL HIGH (ref 70–99)
Glucose-Capillary: 116 mg/dL — ABNORMAL HIGH (ref 70–99)

## 2023-02-08 NOTE — Progress Notes (Signed)
[place] [facility] [place] NAME: Amanda Porter    MR#:  [id number]  DATE OF BIRTH:  June 16, 1946  SUBJECTIVE:   Seen earlier. No family at bedside Overall progressing well. Mentation much better OOB to chair   VITALS:  Blood pressure (!) 129/52, pulse 97, temperature 98.8 F (37.1 C), resp. rate 18, height '5\' 2"'$  (1.575 m), weight 44.1 kg, SpO2 100 %.  PHYSICAL EXAMINATION:   GENERAL:  77 y.o.-year-old patient with no acute distress.  LUNGS: Normal breath sounds bilaterally, no wheezing CARDIOVASCULAR: S1, S2 normal. No murmur   ABDOMEN: Soft, nontender, nondistended. Bowel sounds present.  EXTREMITIES: No  edema b/l.    NEUROLOGIC: nonfocal  patient is awake and alert, Scalp incision looks good, left UE weakness + SKIN: No obvious rash, lesion, or ulcer.   LABORATORY PANEL:  CBC Recent Labs  Lab 02/05/23 0549  WBC 8.2  HGB 8.0*  HCT 26.8*  PLT 378     Chemistries  Recent Labs  Lab 02/03/23 1929 02/04/23 0056 02/05/23 0549  NA 136   < > 137  K 4.2   < > 3.9  CL 101   < > 101  CO2 25   < > 29  GLUCOSE 150*   < > 128*  BUN 20   < > 18  CREATININE 0.72   < > 0.62  CALCIUM 9.1   < > 9.0  MG  --    < > 1.9  AST 28  --   --   ALT 15  --   --   ALKPHOS 63  --   --   BILITOT 0.4  --   --    < > = values in this interval not displayed.    Assessment and Plan Amanda Porter is a 76 y.o. adult who comes in with concern for altered mental status.  Patient has a history of hydrocephalus and dementia at baseline but reports worsening altered mental status she is unable to follow commands and uncoordinated in her left arm patient has had some recent treatments for UTIs with 2 courses of different antibiotics.  Is also had prior brain bleed secondary to falls.    Per ER--Daughter reports that she has had acute change over the past 24 to 48 hours where she seems more altered and confused.  She seems to be having more hallucinations.  She  is typically able to transfer and she is often able to do that.  She goes to an adult daycare but unfortunately she seemed to decline and they were concerned so that also was going on and recommended she come into the ER to be evaluated.  She did last have her hydrocephalus and VP shunt evaluation and fixed by Dr. [name] a few months ago  CT head without contrast 02/03/2023: Mixed density right cerebral convexity subdural hematoma is significantly increased in size since 01/12/2023, currently measuring up to 2.4 cm with worsened mass effect with 1.3 cm rightward midline shift.  Stable position of right frontal ventricular catheter without evidence of hydrocephalus or trapped ventricle.   Worsening Subdural Hematoma with mass effect and rightward shift requiring emergent drain placement and VP shunt removal due to shunt malfunction - Neurosurgery consulted, appreciate input--s/p Shunt removal and drain placement post subdural now remvoed -  OK to start PT/OT, remove foley and start DVT prophylaxis per Dr Adonis Housekeeper - resumed Asa (81 mg) --Neurosurgery ok with d/c when bed available.--signed off  Chronic COPD without exacerbation PMHx: Lung Cancer s/p upper Left Lobectomy, former smoker - Supplemental oxygen as needed, maintain SpO2 > 88% - Bronchodilators as needed   Acute Encephalopathy secondary to worsening subdural hematoma in the setting of baseline dementia Anxiety & Depression - restart outpatient memantine & melatonin on 3/6 -on Buspar & klonopin   Hypertension Hyperlipidemia - restart outpatient Crestor - consider restarting losartan as the patient stabilizes   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 Pt will benefit for Acute inpatient rehab. TOC for d/c planning. Awaiting insurance auth for inpt rehab. Seen by Dr Alexander Mt     Procedures:subdural drain + and removal of  VP shunt Family communication :dter Amanda Porter 3/9 Consults :Neurosurgery, PCCM CODE STATUS: DNR (POA) DVT Prophylaxis :lovenox Level of care: Med-Surg Status is: Inpatient Remains inpatient appropriate because: awaiting CIR eval and d/c planning     TOTAL TIME TAKING CARE OF THIS PATIENT: 35 minutes.  >50% time spent on counselling and coordination of care  Note: This dictation was prepared with Dragon dictation along with smaller phrase technology. Any transcriptional errors that result from this process are unintentional.  [name] M.D    [facility]   CC: Primary care physician; Pleas Koch, NP

## 2023-02-08 NOTE — Progress Notes (Signed)
Physical Therapy Treatment Patient Details Name: Amanda Porter MRN: [id number] DOB: Apr 14, 1946 Today's Date: 02/08/2023   History of Present Illness Pt is a 77 y.o. female presenting to hospital with increased AMS (unable to follow commands and uncoordinated in L arm).  H/o hydrocephalus and dementia baseline.  Recent treatments for UTI's.  Prior brain bleed secondary to falls.  Imaging showing mixed density R cerebral convexity SDH (significantly increased in size since 01/12/23) with worsened mass effect with 1.3 cm rightward midline shift.  Pt s/p R frontal burr hole for drainage of SDH and removal of VP shunt 02/03/23.  Drain removed 02/04/23.  PMH includes R VP shunt for NPH (about 5 months prior), anemia, anxiety, COPD, CVA, dementia, DM, R retinal detachment, tardive dyskinesia, R THA.    PT Comments    Pt in chair.  Inc of urine and linens saturated.  She is able to stand with mod a x 1.  Walk to/from bathroom but did not void.  Gown changed.  She opts to return to bed and is assisted as she was up before breakfast this am.  Pt continues to need +1 assist at all times.  Several imbalances with gait needing assist to recover and assist to manage walker.  Slow to respond at times.     Recommendations for follow up therapy are one component of a multi-disciplinary discharge planning process, led by the attending physician.  Recommendations may be updated based on patient status, additional functional criteria and insurance authorization.  Follow Up Recommendations  Acute inpatient rehab (3hours/day)     Assistance Recommended at Discharge Frequent or constant Supervision/Assistance  Patient can return home with the following A little help with walking and/or transfers;A little help with bathing/dressing/bathroom;Assistance with cooking/housework;Assist for transportation;Help with stairs or ramp for entrance   Equipment Recommendations  Rolling walker (2 wheels);BSC/3in1    Recommendations for  Other Services       Precautions / Restrictions Precautions Precautions: Fall Precaution Comments: s/p R frontal burr hole; monitor BP Restrictions Weight Bearing Restrictions: No     Mobility  Bed Mobility Overal bed mobility: Needs Assistance Bed Mobility: Sit to Supine       Sit to supine: Min guard        Transfers Overall transfer level: Needs assistance Equipment used: Rolling walker (2 wheels) Transfers: Sit to/from Stand Sit to Stand: Min assist                Ambulation/Gait Ambulation/Gait assistance: Min guard, Min assist Gait Distance (Feet): 30 Feet Assistive device: Rolling walker (2 wheels)   Gait velocity: decreased     General Gait Details: 30' x 2 - generally unsteady with assist to manage walker at times.   Stairs             Wheelchair Mobility    Modified Rankin (Stroke Patients Only)       Balance Overall balance assessment: Needs assistance Sitting-balance support: No upper extremity supported, Feet supported Sitting balance-Leahy Scale: Fair     Standing balance support: Bilateral upper extremity supported, During functional activity Standing balance-Leahy Scale: Poor Standing balance comment: unsafe to walk unassisted.  +1 hands on at all times.                            Cognition Arousal/Alertness: Awake/alert Behavior During Therapy: WFL for tasks assessed/performed Overall Cognitive Status: Impaired/Different from baseline  General Comments: increased time to respond to questions/cues.        Exercises      General Comments        Pertinent Vitals/Pain Pain Assessment Pain Assessment: Faces Faces Pain Scale: Hurts even more Pain Location: headache Pain Descriptors / Indicators: Discomfort, Headache, Sharp Pain Intervention(s): Limited activity within patient's tolerance, Monitored during session    Home Living                           Prior Function            PT Goals (current goals can now be found in the care plan section) Progress towards PT goals: Progressing toward goals    Frequency    7X/week      PT Plan Current plan remains appropriate    Co-evaluation              AM-PAC PT "6 Clicks" Mobility   Outcome Measure  Help needed turning from your back to your side while in a flat bed without using bedrails?: A Little Help needed moving from lying on your back to sitting on the side of a flat bed without using bedrails?: A Lot Help needed moving to and from a bed to a chair (including a wheelchair)?: A Little Help needed standing up from a chair using your arms (e.g., wheelchair or bedside chair)?: A Little Help needed to walk in hospital room?: A Little Help needed climbing 3-5 steps with a railing? : A Lot 6 Click Score: 16    End of Session Equipment Utilized During Treatment: Gait belt;Oxygen Activity Tolerance: Patient tolerated treatment well Patient left: with call bell/phone within reach;in chair;with nursing/sitter in room;with chair alarm set Nurse Communication: Mobility status PT Visit Diagnosis: Other abnormalities of gait and mobility (R26.89);Muscle weakness (generalized) (M62.81);History of falling (Z91.81)     Time: [id number] PT Time Calculation (min) (ACUTE ONLY): 18 min  Charges:  $Gait Training: 8-22 mins                   Chesley Noon, PTA 02/08/23, 1:06 PM

## 2023-02-09 ENCOUNTER — Inpatient Hospital Stay: Payer: Medicare PPO

## 2023-02-09 ENCOUNTER — Other Ambulatory Visit: Payer: Self-pay | Admitting: Primary Care

## 2023-02-09 DIAGNOSIS — I10 Essential (primary) hypertension: Secondary | ICD-10-CM

## 2023-02-09 DIAGNOSIS — T8501XS Breakdown (mechanical) of ventricular intracranial (communicating) shunt, sequela: Secondary | ICD-10-CM | POA: Diagnosis not present

## 2023-02-09 DIAGNOSIS — S065XAA Traumatic subdural hemorrhage with loss of consciousness status unknown, initial encounter: Secondary | ICD-10-CM | POA: Diagnosis not present

## 2023-02-09 DIAGNOSIS — R4182 Altered mental status, unspecified: Secondary | ICD-10-CM | POA: Diagnosis not present

## 2023-02-09 LAB — GLUCOSE, CAPILLARY
Glucose-Capillary: 114 mg/dL — ABNORMAL HIGH (ref 70–99)
Glucose-Capillary: 119 mg/dL — ABNORMAL HIGH (ref 70–99)
Glucose-Capillary: 129 mg/dL — ABNORMAL HIGH (ref 70–99)
Glucose-Capillary: 140 mg/dL — ABNORMAL HIGH (ref 70–99)
Glucose-Capillary: 187 mg/dL — ABNORMAL HIGH (ref 70–99)
Glucose-Capillary: 202 mg/dL — ABNORMAL HIGH (ref 70–99)
Glucose-Capillary: 457 mg/dL — ABNORMAL HIGH (ref 70–99)

## 2023-02-09 MED ORDER — HYDROCORTISONE 0.5 % EX CREA
TOPICAL_CREAM | Freq: Two times a day (BID) | CUTANEOUS | Status: DC
  Filled 2023-02-09 (×2): qty 28.35

## 2023-02-09 MED ORDER — DIPHENHYDRAMINE-ZINC ACETATE 2-0.1 % EX CREA: TOPICAL_CREAM | Freq: Two times a day (BID) | CUTANEOUS | Status: DC

## 2023-02-09 NOTE — Progress Notes (Signed)
Occupational Therapy Treatment Patient Details Name: Amanda Porter MRN: [id number] DOB: 04/10/1946 Today's Date: 02/09/2023   History of present illness Pt is a 77 y.o. female presenting to hospital with increased AMS (unable to follow commands and uncoordinated in L arm).  H/o hydrocephalus and dementia baseline.  Recent treatments for UTI's.  Prior brain bleed secondary to falls.  Imaging showing mixed density R cerebral convexity SDH (significantly increased in size since 01/12/23) with worsened mass effect with 1.3 cm rightward midline shift.  Pt s/p R frontal burr hole for drainage of SDH and removal of VP shunt 02/03/23.  Drain removed 02/04/23.  PMH includes R VP shunt for NPH (about 5 months prior), anemia, anxiety, COPD, CVA, dementia, DM, R retinal detachment, tardive dyskinesia, R THA.   OT comments  Ms Woloszyk was seen for OT treatment on this date. Upon arrival to room pt reclined in bed, agreeable to tx. Pt requires  MOD A exit bed, initial L lateral lean requiring MIN A. Improves to SBA, requires SETUP self- feeding seated EOB. MOD A + cues to integrate use of non-dominate LUE into cutting up food. MOD A + RW sit<>stand x2 and ~10 ft mobility bed>chair. Assist to manage RW and navigate room, difficulty sequencing taking steps backwards. Pt making good progress toward goals, will continue to follow POC. Discharge recommendation remains appropriate.     Recommendations for follow up therapy are one component of a multi-disciplinary discharge planning process, led by the attending physician.  Recommendations may be updated based on patient status, additional functional criteria and insurance authorization.    Follow Up Recommendations  Acute inpatient rehab (3hours/day)     Assistance Recommended at Discharge Intermittent Supervision/Assistance  Patient can return home with the following  A little help with walking and/or transfers;A little help with bathing/dressing/bathroom;Help with stairs  or ramp for entrance   Equipment Recommendations  BSC/3in1;Hospital bed    Recommendations for Other Services      Precautions / Restrictions Precautions Precautions: Fall Restrictions Weight Bearing Restrictions: No       Mobility Bed Mobility Overal bed mobility: Needs Assistance Bed Mobility: Supine to Sit     Supine to sit: Mod assist          Transfers Overall transfer level: Needs assistance Equipment used: Rolling walker (2 wheels) Transfers: Sit to/from Stand Sit to Stand: Mod assist                 Balance Overall balance assessment: Needs assistance Sitting-balance support: No upper extremity supported, Feet supported Sitting balance-Leahy Scale: Fair     Standing balance support: Bilateral upper extremity supported, During functional activity Standing balance-Leahy Scale: Fair                             ADL either performed or assessed with clinical judgement   ADL Overall ADL's : Needs assistance/impaired                                       General ADL Comments: MOD A simulated toilet t/f. SETUP + SBA self- feeding seated EOB. MOD A + cues to integrate use of non-dominate LUE into cutting up food.      Cognition Arousal/Alertness: Awake/alert Behavior During Therapy: WFL for tasks assessed/performed Overall Cognitive Status: Impaired/Different from baseline Area of Impairment: Memory, Following commands, Safety/judgement  Memory: Decreased recall of precautions, Decreased short-term memory Following Commands: Follows one step commands inconsistently Safety/Judgement: Decreased awareness of safety, Decreased awareness of deficits                         Pertinent Vitals/ Pain       Pain Assessment Pain Assessment: Faces Faces Pain Scale: Hurts a little bit Pain Location: headache Pain Descriptors / Indicators: Discomfort, Headache, Sharp Pain Intervention(s):  Limited activity within patient's tolerance, Repositioned   Frequency  Min 3X/week        Progress Toward Goals  OT Goals(current goals can now be found in the care plan section)  Progress towards OT goals: Progressing toward goals  Acute Rehab OT Goals Patient Stated Goal: to go home OT Goal Formulation: With patient/family Time For Goal Achievement: 02/19/23 Potential to Achieve Goals: Good ADL Goals Pt Will Perform Grooming: with modified independence;standing Pt Will Perform Lower Body Dressing: with modified independence;sit to/from stand Pt Will Transfer to Toilet: with modified independence;ambulating;regular height toilet  Plan Discharge plan remains appropriate;Frequency remains appropriate    Co-evaluation                 AM-PAC OT "6 Clicks" Daily Activity     Outcome Measure   Help from another person eating meals?: A Little Help from another person taking care of personal grooming?: A Little Help from another person toileting, which includes using toliet, bedpan, or urinal?: A Lot Help from another person bathing (including washing, rinsing, drying)?: A Lot Help from another person to put on and taking off regular upper body clothing?: A Little Help from another person to put on and taking off regular lower body clothing?: A Lot 6 Click Score: 15    End of Session Equipment Utilized During Treatment: Rolling walker (2 wheels)  OT Visit Diagnosis: Other abnormalities of gait and mobility (R26.89);Muscle weakness (generalized) (M62.81)   Activity Tolerance Patient tolerated treatment well   Patient Left in chair;with call bell/phone within reach;with chair alarm set   Nurse Communication Mobility status (RN cleared for pt up to chair)        Time: 0935-1010 OT Time Calculation (min): 35 min  Charges: OT General Charges $OT Visit: 1 Visit OT Treatments $Self Care/Home Management : 23-37 mins  Dessie Coma, M.S. OTR/L  02/09/23, 10:21 AM   ascom [phone]

## 2023-02-09 NOTE — Progress Notes (Signed)
Met with attending at bedside. Daughter concerned about "fluid-like" blisters to right side of head and face. Patient denies any itching or pain at the sites. Daughter also states head is "funny shaped." Patient presents clinically stable. Denies any HA, dizziness, or nausea. MD to notify neurosurgeon of family concerns, per request.

## 2023-02-09 NOTE — Progress Notes (Signed)
Inpatient Rehab Admissions Coordinator:  Spoke with daughter [name] on there telephone. Informed her that insurance authorization has started. Will continue to follow.   Gayland Curry, [place], [place] Admissions Coordinator [phone]

## 2023-02-09 NOTE — Care Management Important Message (Signed)
Important Message  Patient Details  Name: Amanda Porter MRN: [id number] Date of Birth: October 12, 1946   Medicare Important Message Given:  Yes     Dannette Barbara 02/09/2023, 11:49 AM

## 2023-02-09 NOTE — Progress Notes (Signed)
Patient received from the MRI in stable condition via bed.

## 2023-02-09 NOTE — Telephone Encounter (Signed)
Patient's daughter [name] confirmed all appts.

## 2023-02-09 NOTE — Telephone Encounter (Signed)
Per daughter she requested a call back.

## 2023-02-09 NOTE — Progress Notes (Signed)
[facility] [place] NAME: Amanda Porter    MR#:  [id number]  DATE OF BIRTH:  11/06/1946  SUBJECTIVE:   Seen earlier.met dter at bedside Overall progressing well. Mentation much better, continues with left sided weakness OOB to chair Dter noted rash on right scalp and cheek. Pt denies pain  VITALS:  Blood pressure (!) 149/69, pulse 93, temperature 98.8 F (37.1 C), resp. rate 17, height '5\' 2"'$  (1.575 m), weight 45.5 kg, SpO2 100 %.  PHYSICAL EXAMINATION:   GENERAL:  77 y.o.-year-old patient with no acute distress.  LUNGS: Normal breath sounds bilaterally, no wheezing CARDIOVASCULAR: S1, S2 normal. No murmur   ABDOMEN: Soft, nontender, nondistended. Bowel sounds present.  EXTREMITIES: No  edema b/l.    NEUROLOGIC: nonfocal  patient is awake and alert, Scalp incision looks good, left UE weakness + Vesicular rash scalp and cheek-painless SKIN: No obvious rash, lesion, or ulcer.   LABORATORY PANEL:  CBC Recent Labs  Lab 02/05/23 0549  WBC 8.2  HGB 8.0*  HCT 26.8*  PLT 378     Chemistries  Recent Labs  Lab 02/03/23 1929 02/04/23 0056 02/05/23 0549  NA 136   < > 137  K 4.2   < > 3.9  CL 101   < > 101  CO2 25   < > 29  GLUCOSE 150*   < > 128*  BUN 20   < > 18  CREATININE 0.72   < > 0.62  CALCIUM 9.1   < > 9.0  MG  --    < > 1.9  AST 28  --   --   ALT 15  --   --   ALKPHOS 63  --   --   BILITOT 0.4  --   --    < > = values in this interval not displayed.    Assessment and Plan Amanda Porter is a 77 y.o. adult who comes in with concern for altered mental status.  Patient has a history of hydrocephalus and dementia at baseline but reports worsening altered mental status she is unable to follow commands and uncoordinated in her left arm patient has had some recent treatments for UTIs with 2 courses of different antibiotics.  Is also had prior brain bleed secondary to falls.    Per ER--Daughter reports that she has had acute  change over the past 24 to 48 hours where she seems more altered and confused.  She seems to be having more hallucinations.  She is typically able to transfer and she is often able to do that.  She goes to an adult daycare but unfortunately she seemed to decline and they were concerned so that also was going on and recommended she come into the ER to be evaluated.  She did last have her hydrocephalus and VP shunt evaluation and fixed by Dr. [name] a few months ago  CT head without contrast 02/03/2023: Mixed density right cerebral convexity subdural hematoma is significantly increased in size since 01/12/2023, currently measuring up to 2.4 cm with worsened mass effect with 1.3 cm rightward midline shift.  Stable position of right frontal ventricular catheter without evidence of hydrocephalus or trapped ventricle.   Worsening Subdural Hematoma with mass effect and rightward shift requiring emergent drain placement and VP shunt removal due to shunt malfunction - Neurosurgery consulted, appreciate input--s/p Shunt removal and drain placement post subdural now remvoed -  OK to start PT/OT, remove foley and  start DVT prophylaxis per Dr Adonis Housekeeper - resumed Asa (81 mg) --Neurosurgery ok with d/c when bed available.--signed off --CT head today--dter concerned about some right scalp deformed  Chronic COPD without exacerbation PMHx: Lung Cancer s/p upper Left Lobectomy, former smoker - Supplemental oxygen as needed, maintain SpO2 > 88% - Bronchodilators as needed   Acute Encephalopathy secondary to worsening subdural hematoma in the setting of baseline dementia Anxiety & Depression - restart outpatient memantine & melatonin on 3/6 -on Buspar & klonopin   Hypertension Hyperlipidemia - restart outpatient Crestor - consider restarting losartan as the patient stabilizes   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 Pt will benefit for Acute inpatient rehab. TOC for d/c planning. Awaiting insurance auth for inpt rehab. Seen by Dr Alexander Mt     Procedures:subdural drain + and removal of VP shunt Family communication :dter Amanda Porter 3/9 Consults :Neurosurgery, PCCM CODE STATUS: DNR (POA) DVT Prophylaxis :lovenox Level of care: Med-Surg Status is: Inpatient Remains inpatient appropriate because: awaiting CIR eval and d/c planning Per CIR --need to do peer to peer     TOTAL TIME TAKING CARE OF THIS PATIENT: 35 minutes.  >50% time spent on counselling and coordination of care  Note: This dictation was prepared with Dragon dictation along with smaller phrase technology. Any transcriptional errors that result from this process are unintentional.  [name] M.D    [facility]   CC: Primary care physician; Pleas Koch, NP

## 2023-02-09 NOTE — Progress Notes (Signed)
Physical Therapy Treatment Patient Details Name: Amanda Porter MRN: [id number] DOB: January 15, 1946 Today's Date: 02/09/2023   History of Present Illness Pt is a 77 y.o. female presenting to hospital with increased AMS (unable to follow commands and uncoordinated in L arm).  H/o hydrocephalus and dementia baseline.  Recent treatments for UTI's.  Prior brain bleed secondary to falls.  Imaging showing mixed density R cerebral convexity SDH (significantly increased in size since 01/12/23) with worsened mass effect with 1.3 cm rightward midline shift.  Pt s/p R frontal burr hole for drainage of SDH and removal of VP shunt 02/03/23.  Drain removed 02/04/23.  PMH includes R VP shunt for NPH (about 5 months prior), anemia, anxiety, COPD, CVA, dementia, DM, R retinal detachment, tardive dyskinesia, R THA.    PT Comments    Pt in chair.  Stated she is feeling better.  She is able to stand with min/mod a x 1 to RW.  Leaning left today in standing  she does seem to be a bit SOB today and agrees.  Sats 98% on room air.  HR noted to bed high 110's at rest and increases to 125 in standing.  She does have some difficulty marching in place today especially with LLE WB she is quite hesitant but when SLS on RLE does move LLE ok.  She is a bit more talkative today and appropriate but slow to respond at times.  Discussed with MD who will relay to Neuro.     Recommendations for follow up therapy are one component of a multi-disciplinary discharge planning process, led by the attending physician.  Recommendations may be updated based on patient status, additional functional criteria and insurance authorization.  Follow Up Recommendations  Acute inpatient rehab (3hours/day)     Assistance Recommended at Discharge Frequent or constant Supervision/Assistance  Patient can return home with the following A little help with walking and/or transfers;A little help with bathing/dressing/bathroom;Assistance with cooking/housework;Assist for  transportation;Help with stairs or ramp for entrance   Equipment Recommendations  Rolling walker (2 wheels);BSC/3in1    Recommendations for Other Services       Precautions / Restrictions Precautions Precautions: Fall Precaution Comments: s/p R frontal burr hole; monitor BP Restrictions Weight Bearing Restrictions: No     Mobility  Bed Mobility               General bed mobility comments: in chair before and after session Patient Response: Cooperative  Transfers Overall transfer level: Needs assistance Equipment used: Rolling walker (2 wheels) Transfers: Sit to/from Stand Sit to Stand: Mod assist           General transfer comment: left lean today    Ambulation/Gait     Assistive device: Rolling walker (2 wheels)         General Gait Details: hesitant to step today and left lean.  did some minimal marches in place with LLE but difficulties picking up RLE   Stairs             Wheelchair Mobility    Modified Rankin (Stroke Patients Only)       Balance Overall balance assessment: Needs assistance Sitting-balance support: No upper extremity supported, Feet supported Sitting balance-Leahy Scale: Fair   Postural control: Left lateral lean Standing balance support: Bilateral upper extremity supported, During functional activity Standing balance-Leahy Scale: Poor Standing balance comment: hesitant to step today with left lean and seemed to have more trouble with SLS WB on LLE today  Cognition Arousal/Alertness: Awake/alert Behavior During Therapy: WFL for tasks assessed/performed Overall Cognitive Status: Impaired/Different from baseline                         Following Commands: Follows one step commands inconsistently       General Comments: increased time to respond to questions/cues.        Exercises      General Comments        Pertinent Vitals/Pain Pain Assessment Pain  Assessment: No/denies pain    Home Living                          Prior Function            PT Goals (current goals can now be found in the care plan section) Progress towards PT goals: Progressing toward goals    Frequency    7X/week      PT Plan Current plan remains appropriate    Co-evaluation              AM-PAC PT "6 Clicks" Mobility   Outcome Measure  Help needed turning from your back to your side while in a flat bed without using bedrails?: A Little Help needed moving from lying on your back to sitting on the side of a flat bed without using bedrails?: A Lot Help needed moving to and from a bed to a chair (including a wheelchair)?: A Lot Help needed standing up from a chair using your arms (e.g., wheelchair or bedside chair)?: A Lot Help needed to walk in hospital room?: A Lot Help needed climbing 3-5 steps with a railing? : A Lot 6 Click Score: 13    End of Session Equipment Utilized During Treatment: Gait belt;Oxygen Activity Tolerance: Treatment limited secondary to medical complications (Comment) Patient left: with call bell/phone within reach;in chair;with chair alarm set Nurse Communication: Mobility status (increased HR today during session.  MD in on secure chat.) PT Visit Diagnosis: Other abnormalities of gait and mobility (R26.89);Muscle weakness (generalized) (M62.81);History of falling (Z91.81)     Time: [id number] PT Time Calculation (min) (ACUTE ONLY): 14 min  Charges:  $Therapeutic Activity: 8-22 mins                   Chesley Noon, PTA 02/09/23, 1:38 PM

## 2023-02-09 NOTE — Progress Notes (Signed)
Patient sent to MRI in stable condition via bed.

## 2023-02-10 DIAGNOSIS — S065XAA Traumatic subdural hemorrhage with loss of consciousness status unknown, initial encounter: Secondary | ICD-10-CM | POA: Diagnosis not present

## 2023-02-10 DIAGNOSIS — I10 Essential (primary) hypertension: Secondary | ICD-10-CM | POA: Diagnosis not present

## 2023-02-10 DIAGNOSIS — T8501XS Breakdown (mechanical) of ventricular intracranial (communicating) shunt, sequela: Secondary | ICD-10-CM | POA: Diagnosis not present

## 2023-02-10 DIAGNOSIS — R4182 Altered mental status, unspecified: Secondary | ICD-10-CM | POA: Diagnosis not present

## 2023-02-10 LAB — GLUCOSE, CAPILLARY
Glucose-Capillary: 164 mg/dL — ABNORMAL HIGH (ref 70–99)
Glucose-Capillary: 176 mg/dL — ABNORMAL HIGH (ref 70–99)
Glucose-Capillary: 224 mg/dL — ABNORMAL HIGH (ref 70–99)
Glucose-Capillary: 178 mg/dL — ABNORMAL HIGH (ref 70–99)
Glucose-Capillary: 152 mg/dL — ABNORMAL HIGH (ref 70–99)

## 2023-02-10 NOTE — Progress Notes (Signed)
Physical Therapy Treatment Patient Details Name: Amanda Porter MRN: [id number] DOB: 04/15/1946 Today's Date: 02/10/2023   History of Present Illness Pt is a 77 y.o. female presenting to hospital with increased AMS (unable to follow commands and uncoordinated in L arm).  H/o hydrocephalus and dementia baseline.  Recent treatments for UTI's.  Prior brain bleed secondary to falls.  Imaging showing mixed density R cerebral convexity SDH (significantly increased in size since 01/12/23) with worsened mass effect with 1.3 cm rightward midline shift.  Pt s/p R frontal burr hole for drainage of SDH and removal of VP shunt 02/03/23.  Drain removed 02/04/23.  PMH includes R VP shunt for NPH (about 5 months prior), anemia, anxiety, COPD, CVA, dementia, DM, R retinal detachment, tardive dyskinesia, R THA.    PT Comments    Pt in bed.  Awakens easily.  Stated she has not eaten yet but tray is about 40% eaten.  "What is for breakfast?" She does agree to get up to chair but makes no attempt to start motion despite verbal and tactile cues and increased time to respond.  She is assisted with mod/max to EOB and when sitting she is asked to reposition herself to get feet on floor and again agrees but makes not movement to do so.  This continues throughout session as she needs physical assist to uncross her feet, place feet on floor.  Only puts RUE up on walker and keeps LUE held close to her body but does stand with min assist and overall improved standing quality today.  She is able to walk taking several steps in room but when left on her own will stand and seems to forget task that is asked.  She needs verbal and tactile cues to manage walker and take steps.  She is given a short seated rest in chair and HR is WFL today and no SOB noted.  She again stands and walks about 5' forward with similar gait and responses when she stated she is tired.  Cues to turn and walk back to chair but she starts to sit without a seated surface and  is guided to edge of bed.  She sits with hips close to edge of bed and not necessarily safe.  Cued patient to scoot back "OK"  but again makes no effort to reposition and is assisted.  Chair is brought closer for stand pivot transfer with min/mod a to chair and positioned for comfort.  While gait is improved today, primary barrier is cognition.  She is pleasant and seems to want to work in session. CIR is in progress and awaiting insurance auth.  If CIR is not available to pt, SNF would be appropriate.     Recommendations for follow up therapy are one component of a multi-disciplinary discharge planning process, led by the attending physician.  Recommendations may be updated based on patient status, additional functional criteria and insurance authorization.  Follow Up Recommendations  Acute inpatient rehab (3hours/day)     Assistance Recommended at Discharge Frequent or constant Supervision/Assistance  Patient can return home with the following A little help with walking and/or transfers;A little help with bathing/dressing/bathroom;Assistance with cooking/housework;Assist for transportation;Help with stairs or ramp for entrance   Equipment Recommendations  Rolling walker (2 wheels);BSC/3in1    Recommendations for Other Services       Precautions / Restrictions Precautions Precautions: Fall Precaution Comments: s/p R frontal burr hole; monitor BP Restrictions Weight Bearing Restrictions: No     Mobility  Bed  Mobility Overal bed mobility: Needs Assistance Bed Mobility: Supine to Sit     Supine to sit: Max assist     General bed mobility comments: does not initiate movement when asked Patient Response: Flat affect, Cooperative  Transfers Overall transfer level: Needs assistance Equipment used: Rolling walker (2 wheels) Transfers: Sit to/from Stand Sit to Stand: Min guard, Mod assist           General transfer comment: varies during session     Ambulation/Gait Ambulation/Gait assistance: Min guard, Min assist Gait Distance (Feet): 20 Feet Assistive device: Rolling walker (2 wheels) Gait Pattern/deviations: Step-through pattern, Decreased step length - right, Decreased step length - left, Narrow base of support Gait velocity: decreased     General Gait Details: did step well in room today with less hesitancy but does initiate sitting when fatigued when not at a sitting surface and is guided to sit on bed.   Stairs             Wheelchair Mobility    Modified Rankin (Stroke Patients Only)       Balance Overall balance assessment: Needs assistance Sitting-balance support: No upper extremity supported, Feet supported Sitting balance-Leahy Scale: Fair     Standing balance support: Bilateral upper extremity supported, During functional activity Standing balance-Leahy Scale: Fair Standing balance comment: min gaurd/assist today with most tasks.  constant verbal and tactile cues needed.                            Cognition Arousal/Alertness: Awake/alert Behavior During Therapy: Flat affect Overall Cognitive Status: Impaired/Different from baseline                                 General Comments: increased time to respond to questions/cues.  will answer questions but seems to forget what taks she is doing or doesn't initiate movement        Exercises      General Comments        Pertinent Vitals/Pain Pain Assessment Pain Assessment: Faces Faces Pain Scale: Hurts little more Pain Location: headache Pain Descriptors / Indicators: Discomfort, Headache Pain Intervention(s): Monitored during session, Repositioned    Home Living                          Prior Function            PT Goals (current goals can now be found in the care plan section) Progress towards PT goals: Progressing toward goals    Frequency    7X/week      PT Plan Current plan remains  appropriate    Co-evaluation              AM-PAC PT "6 Clicks" Mobility   Outcome Measure  Help needed turning from your back to your side while in a flat bed without using bedrails?: A Little Help needed moving from lying on your back to sitting on the side of a flat bed without using bedrails?: A Lot Help needed moving to and from a bed to a chair (including a wheelchair)?: A Lot Help needed standing up from a chair using your arms (e.g., wheelchair or bedside chair)?: A Lot Help needed to walk in hospital room?: A Lot Help needed climbing 3-5 steps with a railing? : A Lot 6 Click Score: 13    End  of Session Equipment Utilized During Treatment: Gait belt;Oxygen Activity Tolerance: Treatment limited secondary to medical complications (Comment) Patient left: with call bell/phone within reach;in chair;with chair alarm set Nurse Communication: Mobility status (increased HR today during session.  MD in on secure chat.) PT Visit Diagnosis: Other abnormalities of gait and mobility (R26.89);Muscle weakness (generalized) (M62.81);History of falling (Z91.81)     Time: [id number] PT Time Calculation (min) (ACUTE ONLY): 19 min  Charges:  $Gait Training: 8-22 mins                    .Judson Roch

## 2023-02-10 NOTE — Progress Notes (Signed)
Inpatient Rehabilitation Admissions Coordinator   I have received approval from [facility] for CIR admit. I contacted daughter [name] by phone and she is aware. We reviewed estimated cost of care for CIR admit. I await bed availability for CIR admit. Acute team and TOC made aware.  Danne Baxter, RN, MSN Rehab Admissions Coordinator [phone] 02/10/2023 2:40 PM

## 2023-02-10 NOTE — Progress Notes (Signed)
[place] [facility] [place] NAME: Amanda Porter    MR#:  [id number]  DATE OF BIRTH:  September 17, 1946  SUBJECTIVE:   Overall progressing well. Mentation much better, continues with left sided weakness OOB to chair rash on right scalp and cheek--stable. Pt denies pain  VITALS:  Blood pressure (!) 117/53, pulse 96, temperature 99.4 F (37.4 C), resp. rate 16, height '5\' 2"'$  (1.575 m), weight 43.1 kg, SpO2 100 %.  PHYSICAL EXAMINATION:   GENERAL:  77 y.o.-year-old patient with no acute distress.  LUNGS: Normal breath sounds bilaterally, no wheezing CARDIOVASCULAR: S1, S2 normal. No murmur   ABDOMEN: Soft, nontender, nondistended. Bowel sounds present.  EXTREMITIES: No  edema b/l.    NEUROLOGIC: nonfocal  patient is awake and alert, Scalp incision looks good, left UE weakness + Vesicular rash scalp and cheek-painless SKIN: No obvious rash, lesion, or ulcer.   LABORATORY PANEL:  CBC Recent Labs  Lab 02/05/23 0549  WBC 8.2  HGB 8.0*  HCT 26.8*  PLT 378     Chemistries  Recent Labs  Lab 02/03/23 1929 02/04/23 0056 02/05/23 0549  NA 136   < > 137  K 4.2   < > 3.9  CL 101   < > 101  CO2 25   < > 29  GLUCOSE 150*   < > 128*  BUN 20   < > 18  CREATININE 0.72   < > 0.62  CALCIUM 9.1   < > 9.0  MG  --    < > 1.9  AST 28  --   --   ALT 15  --   --   ALKPHOS 63  --   --   BILITOT 0.4  --   --    < > = values in this interval not displayed.    Assessment and Plan Amanda Porter is a 77 y.o. adult who comes in with concern for altered mental status.  Patient has a history of hydrocephalus and dementia at baseline but reports worsening altered mental status she is unable to follow commands and uncoordinated in her left arm patient has had some recent treatments for UTIs with 2 courses of different antibiotics.  Is also had prior brain bleed secondary to falls.    Per ER--Daughter reports that she has had acute change over the past 24 to 48 hours  where she seems more altered and confused.  She seems to be having more hallucinations.  She is typically able to transfer and she is often able to do that.  She goes to an adult daycare but unfortunately she seemed to decline and they were concerned so that also was going on and recommended she come into the ER to be evaluated.  She did last have her hydrocephalus and VP shunt evaluation and fixed by Dr. [name] a few months ago  CT head without contrast 02/03/2023: Mixed density right cerebral convexity subdural hematoma is significantly increased in size since 01/12/2023, currently measuring up to 2.4 cm with worsened mass effect with 1.3 cm rightward midline shift.  Stable position of right frontal ventricular catheter without evidence of hydrocephalus or trapped ventricle.   Worsening Subdural Hematoma with mass effect and rightward shift requiring emergent drain placement and VP shunt removal due to shunt malfunction - Neurosurgery consulted, appreciate input--s/p Shunt removal and drain placement post subdural now remvoed -  OK to start PT/OT, remove foley and start DVT prophylaxis per Dr Adonis Housekeeper -  resumed Asa (81 mg) --Neurosurgery ok with d/c when bed available.--signed off --CT head today--dter concerned about some right scalp deformed  Chronic COPD without exacerbation PMHx: Lung Cancer s/p upper Left Lobectomy, former smoker - Supplemental oxygen as needed, maintain SpO2 > 88% - Bronchodilators as needed   Acute Encephalopathy secondary to worsening subdural hematoma in the setting of baseline dementia Anxiety & Depression - restart outpatient memantine & melatonin on 3/6 -on Buspar & klonopin   Hypertension Hyperlipidemia - restart outpatient Crestor - consider restarting losartan as the patient stabilizes   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 Pt will benefit  for Acute inpatient rehab. TOC for d/c planning. Awaiting insurance auth for inpt rehab. Seen by Dr Alexander Mt     Procedures:subdural drain + and removal of VP shunt Family communication :dter [name] 3/11 at bedside Consults :Neurosurgery, PCCM CODE STATUS: DNR (POA) DVT Prophylaxis :lovenox Level of care: Med-Surg Status is: Inpatient Remains inpatient appropriate because: awaiting CIR eval and d/c planning Per CIR --need to do peer to peer     TOTAL TIME TAKING CARE OF THIS PATIENT: 35 minutes.  >50% time spent on counselling and coordination of care  Note: This dictation was prepared with Dragon dictation along with smaller phrase technology. Any transcriptional errors that result from this process are unintentional.  [name] M.D    [facility]   CC: Primary care physician; Pleas Koch, NP

## 2023-02-10 NOTE — Progress Notes (Signed)
  Inpatient Rehabilitation Admissions Coordinator   I await insurance determination for a possible CIR admit.  Danne Baxter, RN, MSN Rehab Admissions Coordinator [phone] 02/10/2023 1:04 PM

## 2023-02-10 NOTE — Progress Notes (Signed)
Nutrition Follow-up  DOCUMENTATION CODES:   Severe malnutrition in context of chronic illness  INTERVENTION:   -Continue MVI with minerals daily -Continue Magic cup TID with meals, each supplement provides 290 kcal and 9 grams of protein  -Continue Ensure Enlive po BID, each supplement provides 350 kcal and 20 grams of protein  NUTRITION DIAGNOSIS:   Severe Malnutrition related to chronic illness (lung ca, COPD) as evidenced by severe fat depletion, severe muscle depletion.  Ongoing  GOAL:   Patient will meet greater than or equal to 90% of their needs  Progressing   MONITOR:   Diet advancement, Labs, Weight trends, Skin, I & O's  REASON FOR ASSESSMENT:   Rounds    ASSESSMENT:   77 y/o female with h/o normal pressure hydrocephalus s/p VP shunt, HLD, COPD, dementia, dysphagia secondary to esophageal narrowing, MDD, DM, anxiety, HTN, CVA, lung cancer s/p lobectomy, GERD, hip fracture, tardive dyskinesia, DVT and IDA who is admitted with chronic subdural hematoma s/p right frontal burr hole 3/5.  3/5- s/p Right frontal burr hole for drainage of subdural hematoma   Reviewed I/O's: -400 ml x 24 hours and -1.7 L since admission  UOP: 400 ml x 24 hours   Pt pleasant and in good spirits today, sitting up in recliner chair at time of visit. She reports feeling better. Per pt, her appetite is poor at baseline. She ate one graham cracker prior to visit and consumed some eggs for breakfast. Noted meal completions 0-75%. Pt shares that she does not eat much at baseline, but makes sure she drinks her supplements. She estimates that she is drinking 2-3 Ensures a day. Discussed importance of good meal and supplement intake to promote healing. Also provided pt with a soft drink per her request.   Wt has been stable since admission.   Medications reviewed and include vitamin C, ferrous sulfate, folic acid, and melatonin.  Per chart review, pt awaiting insurance authorization for CIR.    Labs reviewed: CBGS: [id number] (inpatient orders for glycemic control are 2.5 mg daily with breakfast, 0-5 units insulin aspart daily at bedtime, and 0-9 units insulin aspart TID with meals).    Diet Order:   Diet Order             Diet Carb Modified Fluid consistency: Thin; Room service appropriate? Yes  Diet effective now                   EDUCATION NEEDS:   Education needs have been addressed  Skin:  Skin Assessment: Reviewed RN Assessment (incision head)  Last BM:  02/08/23  Height:   Ht Readings from Last 1 Encounters:  02/05/23 '5\' 2"'$  (1.575 m)    Weight:   Wt Readings from Last 1 Encounters:  02/10/23 43.1 kg    Ideal Body Weight:  50 kg  BMI:  Body mass index is 17.38 kg/m.  Estimated Nutritional Needs:   Kcal:  1300-1500kcal/day  Protein:  65-75g/day  Fluid:  1.1-1.3L/day    Loistine Chance, RD, LDN, [place] Registered Dietitian II Certified Diabetes Care and Education Specialist Please refer to AMION for RD and/or RD on-call/weekend/after hours pager

## 2023-02-11 ENCOUNTER — Telehealth: Payer: Self-pay | Admitting: Primary Care

## 2023-02-11 ENCOUNTER — Ambulatory Visit: Payer: Medicare PPO

## 2023-02-11 ENCOUNTER — Encounter: Payer: Self-pay | Admitting: Internal Medicine

## 2023-02-11 DIAGNOSIS — I10 Essential (primary) hypertension: Secondary | ICD-10-CM | POA: Diagnosis not present

## 2023-02-11 DIAGNOSIS — Z7189 Other specified counseling: Secondary | ICD-10-CM

## 2023-02-11 DIAGNOSIS — Z515 Encounter for palliative care: Secondary | ICD-10-CM | POA: Diagnosis not present

## 2023-02-11 DIAGNOSIS — S065XAA Traumatic subdural hemorrhage with loss of consciousness status unknown, initial encounter: Secondary | ICD-10-CM | POA: Diagnosis not present

## 2023-02-11 DIAGNOSIS — G935 Compression of brain: Secondary | ICD-10-CM | POA: Diagnosis not present

## 2023-02-11 DIAGNOSIS — T8501XS Breakdown (mechanical) of ventricular intracranial (communicating) shunt, sequela: Secondary | ICD-10-CM | POA: Diagnosis not present

## 2023-02-11 LAB — GLUCOSE, CAPILLARY
Glucose-Capillary: 157 mg/dL — ABNORMAL HIGH (ref 70–99)
Glucose-Capillary: 170 mg/dL — ABNORMAL HIGH (ref 70–99)
Glucose-Capillary: 179 mg/dL — ABNORMAL HIGH (ref 70–99)
Glucose-Capillary: 163 mg/dL — ABNORMAL HIGH (ref 70–99)
Glucose-Capillary: 108 mg/dL — ABNORMAL HIGH (ref 70–99)

## 2023-02-11 NOTE — Progress Notes (Signed)
Inpatient Rehabilitation Admissions Coordinator   CIR bed is not available to admit her to today at [facility] in [place]. I await bed availability to admit.  Danne Baxter, RN, MSN Rehab Admissions Coordinator [phone] 02/11/2023 10:41 AM

## 2023-02-11 NOTE — Hospital Course (Signed)
Amanda Porter is a 77 y.o. adult who comes in with concern for altered mental status.  Patient has a history of hydrocephalus and dementia at baseline but reports worsening altered mental status she is unable to follow commands and uncoordinated in her left arm patient has had some recent treatments for UTIs with 2 courses of different antibiotics.  Is also had prior brain bleed secondary to falls.    Per ER--Daughter reports that she has had acute change over the past 24 to 48 hours where she seems more altered and confused.  She seems to be having more hallucinations.  She is typically able to transfer and she is often able to do that.  She goes to an adult daycare but unfortunately she seemed to decline and they were concerned so that also was going on and recommended she come into the ER to be evaluated.  She did last have her hydrocephalus and VP shunt evaluation and fixed by Dr. [name] a few months ago   CT head without contrast 02/03/2023: Mixed density right cerebral convexity subdural hematoma is significantly increased in size since 01/12/2023, currently measuring up to 2.4 cm with worsened mass effect with 1.3 cm rightward midline shift.  Stable position of right frontal ventricular catheter without evidence of hydrocephalus or trapped ventricle.   3/13: Waiting for CIR bed availability

## 2023-02-11 NOTE — Progress Notes (Signed)
[facility] Liaison Note  Notified by PMT provider/T. Hulan Fray, NP of patient/family request of [facility] Paliative services. TOC/Darrian aware.  [facility] lia[facility] will follow patient for discharge disposition.   Please call with any questions/concerns.    Thank you for the opportunity to participate in this patient's care.   Phillis Haggis, MSW [facility] Liaison  [phone]

## 2023-02-11 NOTE — Telephone Encounter (Signed)
Stacy from [facility] called over and stated that patient is at [facility]. She stated that they are wanting palliative care to follow patient. She was wanting to know if this will be fine. Please advise. Thank you!

## 2023-02-11 NOTE — Progress Notes (Signed)
Progress Note  History: Amanda Porter is s/p right frontal burr hole for SDH drainage and removal of VP shunt   POD8: Pt without complaints this morning POD4: No complaints POD3: Pt denying headache this morning POD2: Pt back to cognitive baseline. Denies any headaches this morning and is looking forward to breakfast POD1: Pt appears to be at neurologic baseline. Left sided weakness improving   Physical Exam: Vitals:   02/11/23 0405 02/11/23 0805  BP: (!) 108/47 (!) 120/99  Pulse: 88 100  Resp: 18 19  Temp: 98.7 F (37.1 C) 98.4 F (36.9 C)  SpO2: 100% 100%    Oriented x 2. Recalls staple removal from when in ICU for dressing removal MAEW except limited movement in LUE due to baseline frozen shoulder  Incision c/d/I with staples in place  Data:  Other tests/results:  CT head 02/04/23 Narrative & Impression  CLINICAL DATA:  77 year old female with increased right side subdural hematoma. Postoperative day 1 status post right frontal burr hole drainage of subdural hematoma. History of right side CSF shunt.   EXAM: CT HEAD WITHOUT CONTRAST   TECHNIQUE: Contiguous axial images were obtained from the base of the skull through the vertex without intravenous contrast.   RADIATION DOSE REDUCTION: This exam was performed according to the departmental dose-optimization program which includes automated exposure control, adjustment of the mA and/or kV according to patient size and/or use of iterative reconstruction technique.   COMPARISON:  Head CT yesterday and earlier.   FINDINGS: Brain: Right frontal approach CSF shunt, reservoir and subcutaneous catheter have been removed. New right superior vertex burr hole and percutaneous subdural drain placed.   Subtotal drainage of the mixed density right subdural hematoma since yesterday. But postoperative gas now within the non collapsed right side subdural space. Trace residual posterior convexity subdural fluid adjacent to the  indwelling drain on series 3, image 20.   Small volume right frontal horn pneumo ventricle and trace left layering IVH (series 3, image 18) following CSF shunt removal.   Decreased intracranial mass effect. Less mass effect on the right lateral ventricle. Leftward midline shift has decreased to 6-7 mm (previously up to 13 mm). Normalized suprasellar cistern. Other basilar cisterns are patent. No evidence of trapped ventricle.   Chronic right thalamic lacunar infarct. Patchy and confluent cerebral white matter hypodensity. No cortically based acute infarct identified.   Vascular: Calcified atherosclerosis at the skull base.   Skull: New right vertex burr hole superimposed on previous shunt related burr hole. Elsewhere the skull appears intact.   Sinuses/Orbits: Visualized paranasal sinuses and mastoids are stable and well aerated.   Other: New postoperative changes to the right scalp vertex. Skin staples in place. Orbits appear negative.   IMPRESSION: 1. Postoperative subtotal drainage of the mixed density right subdural fluid. Postoperative gas now occupying the non-collapsed right subdural space, with percutaneous subdural drain in place. 2. Small volume pneumo-ventricle and trace dependent intraventricular blood following CSF shunt removal. Improved lateral ventricle patency and regressed leftward midline shift (now 6-7 mm). 3. Normalized basilar cisterns. Chronic cerebral small vessel disease.     Electronically Signed   By: Genevie Ann M.D.   On: 02/04/2023 06:08    Assessment/Plan:  Amanda Porter is a 77 y.o presenting with AMS and weakness s/p right burr hole for evacuation of SDH.   - pt ok for DVT prophylaxis  - drain removed 02/04/23 - staples removed 02/11/23 - remainder of care per internal medicine - will follow up  outpatient  Cooper Render PA-C Department of Neurosurgery

## 2023-02-11 NOTE — Progress Notes (Signed)
Occupational Therapy Treatment Patient Details Name: Amanda Porter MRN: [id number] DOB: [birth year] Today's Date: 02/11/2023   History of present illness Pt is a 77 y.o. female presenting to hospital with increased AMS (unable to follow commands and uncoordinated in L arm).  H/o hydrocephalus and dementia baseline.  Recent treatments for UTI's.  Prior brain bleed secondary to falls.  Imaging showing mixed density R cerebral convexity SDH (significantly increased in size since 01/12/23) with worsened mass effect with 1.3 cm rightward midline shift.  Pt s/p R frontal burr hole for drainage of SDH and removal of VP shunt 02/03/23.  Drain removed 02/04/23.  PMH includes R VP shunt for NPH (about 5 months prior), anemia, anxiety, COPD, CVA, dementia, DM, R retinal detachment, tardive dyskinesia, R THA.   OT comments  [name] was seen for OT treatment on this date. Upon arrival to room pt reclined in bed, oriented to self/location only, agreeable to tx. Pt requires MOD A exit bed, initially demonstrates significant difficulty/delay following commands however with time/activity improves to MIN cues. MIN A + HHA sit<>stand x5, MIN a to initiate lift off, verbal and tactile cues to sequence steps along EOB ~5 ft x2. MIN A tooth brushing seated EOB, initial fair righting ractions however as pt fatigues requires MIN A sitting balance (R lateral lean). PA in to remove sutures, pt returned to supine CGA. Pt making good progress toward goals, will continue to follow POC. Discharge recommendation remains appropriate.   Orthostatic VS for the past 24 hrs:  BP- Lying Pulse- Lying BP- Sitting Pulse- Sitting BP- Standing at 0 minutes Pulse- Standing at 0 minutes  02/11/23 1000 91/46 93 107/48 96 112/59 105        Recommendations for follow up therapy are one component of a multi-disciplinary discharge planning process, led by the attending physician.  Recommendations may be updated based on patient status, additional  functional criteria and insurance authorization.    Follow Up Recommendations  Acute inpatient rehab (3hours/day)     Assistance Recommended at Discharge Intermittent Supervision/Assistance  Patient can return home with the following  A little help with walking and/or transfers;A little help with bathing/dressing/bathroom;Help with stairs or ramp for entrance   Equipment Recommendations  BSC/3in1;Hospital bed    Recommendations for Other Services      Precautions / Restrictions Precautions Precautions: Fall Precaution Comments: s/p R frontal burr hole; monitor BP Restrictions Weight Bearing Restrictions: No       Mobility Bed Mobility Overal bed mobility: Needs Assistance Bed Mobility: Supine to Sit, Sit to Supine     Supine to sit: Mod assist Sit to supine: Min guard        Transfers Overall transfer level: Needs assistance Equipment used: 1 person hand held assist Transfers: Sit to/from Stand Sit to Stand: Min assist           General transfer comment: x5 sit<>stand with cues + MIN a to initiate lift off     Balance Overall balance assessment: Needs assistance Sitting-balance support: Single extremity supported, Feet supported Sitting balance-Leahy Scale: Fair   Postural control: Right lateral lean Standing balance support: Bilateral upper extremity supported, During functional activity Standing balance-Leahy Scale: Fair                             ADL either performed or assessed with clinical judgement   ADL Overall ADL's : Needs assistance/impaired  General ADL Comments: MIN A for simulated BSC t/f, verbal and tactile cues to sequence steps along EOB. MIN A tooth brushing seated EOB, initial fair righting ractions however as pt fatigues requires MIN A sitting balance (R lateral lean).      Cognition Arousal/Alertness: Awake/alert Behavior During Therapy: Flat affect Overall  Cognitive Status: Impaired/Different from baseline Area of Impairment: Memory, Following commands, Orientation                 Orientation Level: Disoriented to, Time, Situation Current Attention Level: Focused Memory: Decreased recall of precautions, Decreased short-term memory Following Commands: Follows one step commands inconsistently Safety/Judgement: Decreased awareness of safety, Decreased awareness of deficits     General Comments: cues to initiate mobility, improved with increased alertness with activity.                   Pertinent Vitals/ Pain       Pain Assessment Pain Assessment: Faces Faces Pain Scale: Hurts a little bit Pain Location: headache Pain Descriptors / Indicators: Discomfort, Headache Pain Intervention(s): Limited activity within patient's tolerance, Repositioned   Frequency  Min 3X/week        Progress Toward Goals  OT Goals(current goals can now be found in the care plan section)  Progress towards OT goals: Progressing toward goals  Acute Rehab OT Goals Patient Stated Goal: to go home OT Goal Formulation: With patient/family Time For Goal Achievement: 02/19/23 Potential to Achieve Goals: Good ADL Goals Pt Will Perform Grooming: with modified independence;standing Pt Will Perform Lower Body Dressing: with modified independence;sit to/from stand Pt Will Transfer to Toilet: with modified independence;ambulating;regular height toilet  Plan Discharge plan remains appropriate;Frequency remains appropriate    Co-evaluation                 AM-PAC OT "6 Clicks" Daily Activity     Outcome Measure   Help from another person eating meals?: A Little Help from another person taking care of personal grooming?: A Little Help from another person toileting, which includes using toliet, bedpan, or urinal?: A Lot Help from another person bathing (including washing, rinsing, drying)?: A Lot Help from another person to put on and taking  off regular upper body clothing?: A Little Help from another person to put on and taking off regular lower body clothing?: A Lot 6 Click Score: 15    End of Session Equipment Utilized During Treatment: Gait belt  OT Visit Diagnosis: Other abnormalities of gait and mobility (R26.89);Muscle weakness (generalized) (M62.81)   Activity Tolerance Patient tolerated treatment well   Patient Left in bed;with call bell/phone within reach;with bed alarm set (PA at bed side)   Nurse Communication          [id number] OT Time Calculation (min): 25 min  Charges: OT General Charges $OT Visit: 1 Visit OT Treatments $Self Care/Home Management : 23-37 mins  Dessie Coma, M.S. OTR/L  02/11/23, 10:07 AM  ascom [phone]

## 2023-02-11 NOTE — Progress Notes (Addendum)
PT Cancellation Note  Patient Details Name: Amanda Porter MRN: [id number] DOB: 15-Nov-1946   Cancelled Treatment:    1115am: PT attempt. Pt request author return at a later time (~ 1:30 pm). Will return as requested to progress pt towards PT goals.   Returned at 130pm as requested however pt still eating lunch.  1505: Author has attempted to treat pt 4 x throughout the day. She was unwilling and requested author return  tomorrow. Max encouragement for participation however pt remained resistive. Acute PT will continue to follow per current POC.   Willette Pa 02/11/2023, 11:31 AM

## 2023-02-11 NOTE — Telephone Encounter (Signed)
Called [facility] back, unable to get in touch with [facility]. I was advised she will give me a call back.

## 2023-02-11 NOTE — Progress Notes (Signed)
Progress Note   Patient: Amanda Porter [id number] DOB: 08-Apr-1946 DOA: 02/03/2023     8 DOS: the patient was seen and examined on 02/11/2023   Brief hospital course: Amanda Porter is a 77 y.o. adult who comes in with concern for altered mental status.  Patient has a history of hydrocephalus and dementia at baseline but reports worsening altered mental status she is unable to follow commands and uncoordinated in her left arm patient has had some recent treatments for UTIs with 2 courses of different antibiotics.  Is also had prior brain bleed secondary to falls.    Per ER--Daughter reports that she has had acute change over the past 24 to 48 hours where she seems more altered and confused.  She seems to be having more hallucinations.  She is typically able to transfer and she is often able to do that.  She goes to an adult daycare but unfortunately she seemed to decline and they were concerned so that also was going on and recommended she come into the ER to be evaluated.  She did last have her hydrocephalus and VP shunt evaluation and fixed by Dr. [name] a few months ago   CT head without contrast 02/03/2023: Mixed density right cerebral convexity subdural hematoma is significantly increased in size since 01/12/2023, currently measuring up to 2.4 cm with worsened mass effect with 1.3 cm rightward midline shift.  Stable position of right frontal ventricular catheter without evidence of hydrocephalus or trapped ventricle.   3/13: Waiting for CIR bed availability  Assessment and Plan:  Worsening Subdural Hematoma with mass effect and rightward shift requiring emergent drain placement and VP shunt removal due to shunt malfunction - Neurosurgery consulted, appreciate input--s/p Shunt removal and drain placement post subdural drainage.  Drain removal on 02/04/2023, staples removed on 02/11/2023.  POD 8 -  PT/OT while here.  Waiting for CIR bed placement, removed foley and started DVT prophylaxis per Dr  Adonis Housekeeper - resumed Asa (81 mg) --Neurosurgery ok with d/c when bed available. --CT head repeated on 3/11 and was showing decrease in the size of subdural hematoma along with decrease right to left midline shift.--dter concerned about some right scalp deformed   Chronic COPD without exacerbation PMHx: Lung Cancer s/p upper Left Lobectomy, former smoker - Supplemental oxygen as needed, maintain SpO2 > 88% - Bronchodilators as needed   Acute Encephalopathy secondary to worsening subdural hematoma in the setting of baseline dementia Anxiety & Depression - restart outpatient memantine & melatonin on 3/6 -on Buspar & klonopin   Hypertension Hyperlipidemia - restart outpatient Crestor - consider restarting losartan as the patient stabilizes   Type 2 Diabetes Mellitus Steroid Induced Hyperglycemia Hemoglobin A1C: 6.8 - SSI sensitive dosing - target range while in ICU: 140-180   PT/OT eval noted. Pt will benefit for Acute inpatient rehab.  Await insurance approval and bed availability at CIR      Subjective: No new issues  Physical Exam: Vitals:   02/11/23 0405 02/11/23 0413 02/11/23 0805 02/11/23 1000  BP: (!) 108/47  (!) 120/99   Pulse: 88  100   Resp: 18  19   Temp: 98.7 F (37.1 C)  98.4 F (36.9 C)   TempSrc: Oral     SpO2: 100%  100% 100%  Weight:  46.6 kg    Height:       GENERAL:  77 y.o.-year-old patient with no acute distress.  LUNGS: Normal breath sounds bilaterally, no wheezing CARDIOVASCULAR: S1, S2 normal. No murmur  ABDOMEN: Soft, nontender, nondistended. Bowel sounds present.  EXTREMITIES: No  edema b/l.    NEUROLOGIC: nonfocal  patient is awake and alert, Scalp incision looks good, left UE weakness + Vesicular rash scalp and cheek-painless SKIN: No obvious rash, lesion, or ulcer.  Data Reviewed:  There are no new results to review at this time.  Family Communication: None at bedside  Disposition: Status is: Inpatient Remains inpatient appropriate  because: Medically stable, waiting for CIR placement  Planned Discharge Destination: Rehab   DVT prophylaxis-Lovenox Time spent: 25 minutes  Author: Max Sane, MD 02/11/2023 12:19 PM  For on call review [email or web address].

## 2023-02-11 NOTE — Consult Note (Signed)
Consultation Note Date: 02/11/2023   Patient Name: Amanda Porter  DOB: [birth year]  MRN: [id number]  Age / Sex: 77 y.o., female  PCP: Pleas Koch, NP Referring Physician: Max Sane, MD  Reason for Consultation: Establishing goals of care  HPI/Patient Profile: 77 y.o. female  with past medical history of normal pressure hydrocephalus status post VP shunt in 2018 with multiple revisions, CVA with left hemiparesis, dementia, HTN/HLD, DM2, frequent UTIs, 50+ year smoker/COPD 3 L, lung cancer status post left upper lobectomy, tardive dyskinesia, DVT, depression anxiety, falls, admitted on 02/03/2023 with worsening subdural hematoma with mass effect and right shift requiring emergent drain and placement of VP shunt due to shunt malfunction.  Awaiting CIR bed placement.  Clinical Assessment and Goals of Care: I have reviewed medical records including EPIC notes, labs and imaging, received report from RN, assessed the patient.  Amanda Porter is sitting up quietly in bed.  She is holding a soda which she is able to drink from independently.  She is alert, pleasant.  She greets me, making and somewhat keeping eye contact.  She is oriented to self and situation, stating that we are in a rehab.  She names the wrong city.  I do believe that she can make her basic needs known.  There is no family at bedside at this time.  She tells me that she lives with her daughter, Amanda Porter, her only child.  Call to daughter, Peri Maris, to discuss diagnosis prognosis, [place], EOL wishes, disposition and options.  I introduced Palliative Medicine as specialized medical care for people living with serious illness. It focuses on providing relief from the symptoms and stress of a serious illness. The goal is to improve quality of life for both the patient and the family.  It seems that Amanda Porter had been active with [facility] for outpatient palliative  services beginning January 2023.  We then focused on their current illness.  Overall, Amanda Porter is very knowledgeable about her mother's acute and chronic health concerns.  She shares that the natural disease trajectory and expectations at EOL were discussed.  Hospice and Palliative Care services outpatient were explained and offered.  It seems that Mrs. [name] was active with [facility] for outpatient palliative services beginning January 2023.  Amanda Porter states that the services stopped.  She tells me that they are agreeable to continue with [facility] outpatient palliative services if offered.  She shares that they are considering hospice care in the future.  Discussed the importance of continued conversation with family and the medical providers regarding overall plan of care and treatment options, ensuring decisions are within the context of the patient's values and GOCs.  Questions and concerns were addressed. The family was encouraged to call with questions or concerns.  PMT will continue to support holistically.  Conference with attending, bedside nursing staff, transition of care team, inpatient [facility] representative related to patient condition, needs, goals of care, disposition.   HCPOA  NEXT OF KIN -daughter/only child, Amanda Porter.    SUMMARY OF RECOMMENDATIONS  This point continue to treat the treatable but no CPR or intubation Time for outcomes CIR for acute inpatient rehab with the ultimate goal of returning home [facility] for outpatient palliative services, considering hospice care in future    Code Status/Advance Care Planning: DNR -goldenrod form on chart.  Symptom Management:  Per hospitalist, no additional needs at this time.  Palliative Prophylaxis:  Frequent Pain Assessment  Additional Recommendations (Limitations, Scope, Preferences): Treat the treatable but no CPR or intubation  Psycho-social/Spiritual:  Desire for further Chaplaincy support:no Additional Recommendations:  Caregiving  Support/Resources  Prognosis:  Unable to determine, based on outcomes.  Discharge Planning:, At this point accepted to CIR, acute inpatient rehab.       Primary Diagnoses: Present on Admission:  Subdural hematoma ([place])  Primary hypertension   I have reviewed the medical record, interviewed the patient and family, and examined the patient. The following aspects are pertinent.  Past Medical History:  Diagnosis Date   Acute respiratory failure requiring reintubation ([place]) 11/05/2021   Anemia    Anxiety    Anxiety and depression    COPD (chronic obstructive pulmonary disease) (HCC)    CVA (cerebral vascular accident) ([place])    CVA (cerebral vascular accident) ([place])    L sided deficits   Dementia ([place])    Depression    Diabetes mellitus ([place])    Type 2   Diabetes mellitus without complication ([place])    Dysphagia    Dysphasia    Falls    GERD (gastroesophageal reflux disease)    HCAP (healthcare-associated pneumonia) 12/16/2022   History of lung cancer    Hyperlipidemia    Hypertension    Influenza A 12/13/2021   Memory disturbance 11/20/2017   Normal pressure hydrocephalus ([place]) 2018   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   Social History   Socioeconomic History   Marital status: Divorced    Spouse name: Not on file   Number of children: 1   Years of education: trade school   Highest education level: Not on file  Occupational History   Occupation: Retired  Tobacco Use   Smoking status: Former    Packs/day: 0.50    Years: 50.00    Total pack years: 25.00    Types: Cigarettes    Quit date: 08/01/2017    Years since quitting: 5.5    Passive exposure: Past   Smokeless tobacco: Never  Vaping Use   Vaping Use: Never used   Substance and Sexual Activity   Alcohol use: Not Currently   Drug use: Not Currently   Sexual activity: Not Currently  Other Topics Concern   Not on file  Social History Narrative   ** Merged History Encounter **       Moved from [place]. Lives with daughter. Right handed    Social Determinants of Health   Financial Resource Strain: Low Risk  (10/20/2022)   Overall Financial Resource Strain (CARDIA)    Difficulty of Paying Living Expenses: Not very hard  Food Insecurity: No Food Insecurity (12/23/2022)   Hunger Vital Sign    Worried About Running Out of Food in the Last Year: Never true    Ran Out of Food in the Last Year: Never true  Transportation  Needs: No Transportation Needs (12/23/2022)   PRAPARE - Hydrologist (Medical): No    Lack of Transportation (Non-Medical): No  Physical Activity: Not on file  Stress: No Stress Concern Present (10/20/2022)   [place]    Feeling of Stress : Not at all  Social Connections: Not on file   Family History  Problem Relation Age of Onset   Pneumonia Mother    Cancer Father    Hyperlipidemia Sister    Hypertension Sister    Heart disease Sister    Diabetes Sister    Hypertension Sister    Diabetes Sister    Pancreatic cancer Sister    Throat cancer Brother    Cancer Brother    Cancer Brother    Breast cancer Neg Hx    Scheduled Meds:  amLODipine  2.5 mg Oral Daily   arformoterol  15 mcg Nebulization BID   And   umeclidinium bromide  1 puff Inhalation Daily   ascorbic acid  500 mg Oral Daily   aspirin  81 mg Oral Daily   buPROPion  300 mg Oral Daily   busPIRone  30 mg Oral BID   Chlorhexidine Gluconate Cloth  6 each Topical Daily   enoxaparin (LOVENOX) injection  30 mg Subcutaneous Q24H   feeding supplement  237 mL Oral BID BM   ferrous sulfate  325 mg Oral Q breakfast   folic acid  1 mg Oral Daily   glipiZIDE  2.5 mg Oral Q  breakfast   hydrocortisone cream   Topical BID   insulin aspart  0-5 Units Subcutaneous QHS   insulin aspart  0-9 Units Subcutaneous TID WC   lactobacillus  1 g Oral Daily   melatonin  10 mg Oral QHS   memantine  5 mg Oral BID   multivitamin with minerals  1 tablet Oral Daily   mupirocin ointment   Nasal BID   pantoprazole  40 mg Oral QHS   rosuvastatin  20 mg Oral QPM   Continuous Infusions: PRN Meds:.acetaminophen, albuterol, docusate sodium, labetalol, polyethylene glycol Medications Prior to Admission:  Prior to Admission medications   Medication Sig Start Date End Date Taking? Authorizing Provider  acetaminophen (TYLENOL) 325 MG tablet Take 650 mg by mouth every 4 (four) hours as needed for mild pain.   Yes [provider]  albuterol (VENTOLIN HFA) 108 (90 Base) MCG/ACT inhaler INHALE 2 PUFFS BY MOUTH EVERY 4 HOURS AS NEEDED 07/11/22  Yes Young, Clinton D, MD  Albuterol Sulfate 2.5 MG/0.5ML NEBU Inhale 2.5 mg into the lungs every 6 (six) hours as needed (shortness of breath).   Yes [provider]  ascorbic acid (VITAMIN C) 500 MG tablet Take 1 tablet (500 mg total) by mouth daily. 12/19/22 03/19/23 Yes Val Riles, MD  aspirin 81 MG chewable tablet Chew 1 tablet (81 mg total) by mouth daily. Hold for now and resume on 12/22/22 12/22/22  Yes Val Riles, MD  buPROPion (WELLBUTRIN XL) 300 MG 24 hr tablet Take 1 tablet (300 mg total) by mouth daily with breakfast. for anxiety and depression. 11/10/22  Yes Pleas Koch, NP  busPIRone (BUSPAR) 30 MG tablet Take 30 mg by mouth 2 (two) times daily. 02/03/23  Yes [provider]  Calcium Carb-Cholecalciferol (CALCIUM/VITAMIN D PO) Take 1 tablet by mouth daily.   Yes [provider]  clonazePAM (KLONOPIN) 1 MG tablet Take 1 mg by mouth at bedtime.  Yes [provider]  conjugated estrogens (PREMARIN) vaginal cream Apply one pea-sized amount around the opening of the urethra daily for 2 weeks,  then 3 times weekly moving forward. 08/20/22  Yes Vaillancourt, Aldona Bar, PA-C  ferrous sulfate 325 (65 FE) MG tablet Take 1 tablet (325 mg total) by mouth daily with breakfast. 12/19/22 03/19/23 Yes Val Riles, MD  folic acid (FOLVITE) 1 MG tablet Take 1 mg by mouth daily.   Yes [provider]  glipiZIDE (GLUCOTROL XL) 2.5 MG 24 hr tablet TAKE 1 TABLET (2.5 MG TOTAL) BY MOUTH DAILY WITH BREAKFAST. FOR DIABETES. 08/04/22  Yes Pleas Koch, NP  LACTOBACILLUS PROBIOTIC PO Take 1 capsule by mouth daily.   Yes [provider]  Melatonin 10 MG TABS Take 10 mg by mouth at bedtime.   Yes [provider]  memantine (NAMENDA) 5 MG tablet Take 1 tablet (5 mg total) by mouth 2 (two) times daily. For memory. 07/14/22  Yes Pleas Koch, NP  Menthol, Topical Analgesic, (BIOFREEZE) 4 % GEL Apply topically in the morning and at bedtime.   Yes [provider]  pantoprazole (PROTONIX) 40 MG tablet Take 1 tablet (40 mg total) by mouth daily. 12/19/22 03/19/23 Yes Val Riles, MD  rosuvastatin (CRESTOR) 20 MG tablet TAKE 1 TABLET (20 MG TOTAL) BY MOUTH EVERY EVENING. FOR CHOLESTEROL. 07/13/22  Yes Pleas Koch, NP  Tiotropium Bromide-Olodaterol (STIOLTO RESPIMAT) 2.5-2.5 MCG/ACT AERS Inhale 2 each into the lungs in the morning.   Yes [provider]  amLODipine (NORVASC) 2.5 MG tablet TAKE 1 TABLET (2.5 MG TOTAL) BY MOUTH DAILY FOR BLOOD PRESSURE 02/09/23   Pleas Koch, NP  Continuous Blood Gluc Sensor (FREESTYLE LIBRE 3 SENSOR) MISC Place 1 sensor on the skin every 14 days. Use to check glucose continuously 12/19/22   Pleas Koch, NP  PROLIA 60 MG/ML SOSY injection Inject 60 mg into the skin every 6 (six) months. 01/27/23   [provider]   Allergies  Allergen Reactions   Lexapro [Escitalopram Oxalate] Other (See Comments)    Dizziness, nausea, fatigue   Neosporin  [Neomycin-Polymyxin-Gramicidin] Rash    (Neosporin)   Neosporin  [Bacitracin-Polymyxin B] Rash   Review of Systems  Unable to perform ROS: Dementia    Physical Exam Vitals and nursing note reviewed.  Constitutional:      General: She is not in acute distress.    Appearance: She is ill-appearing.  HENT:     Head:     Comments: Well-healed suture line right side top head Cardiovascular:     Rate and Rhythm: Normal rate.  Pulmonary:     Effort: Pulmonary effort is normal. No respiratory distress.  Musculoskeletal:        General: No swelling.  Skin:    General: Skin is warm and dry.  Neurological:     Mental Status: She is alert.     Comments: Oriented to self and situation, states rehab, wrong city  Psychiatric:        Mood and Affect: Mood normal.        Behavior: Behavior normal.     Comments: Calm and cooperative, not fearful     Vital Signs: BP (!) 112/50 (BP Location: Left Arm)   Pulse 88   Temp 98.4 F (36.9 C)   Resp 17   Ht '5\' 2"'$  (1.575 m)   Wt 46.6 kg   SpO2 100%   BMI 18.79 kg/m  Pain Scale: 0-10   Pain  Score: 0-No pain   SpO2: SpO2: 100 % O2 Device:SpO2: 100 % O2 Flow Rate: .O2 Flow Rate (L/min): 2 L/min  IO: Intake/output summary:  Intake/Output Summary (Last 24 hours) at 02/11/2023 1237 Last data filed at 02/11/2023 [id number] Gross per 24 hour  Intake 600 ml  Output 250 ml  Net 350 ml    LBM: Last BM Date : 02/10/23 Baseline Weight: Weight: 44.5 kg Most recent weight: Weight: 46.6 kg     Palliative Assessment/Data:     Time In: 1000 Time Out: 1055 Time Total: 55 minutes  Greater than 50%  of this time was spent counseling and coordinating care related to the above assessment and plan.  Signed by: Drue Novel, NP   Please contact Palliative Medicine Team phone at [phone] for questions and concerns.  For individual provider: See Shea Evans

## 2023-02-11 NOTE — Telephone Encounter (Signed)
Called and advised Stacy with [facility] of the approval of the requested verbal orders for this patient. Advised to call back with any further questions.

## 2023-02-11 NOTE — H&P (Incomplete)
Physical Medicine and Rehabilitation Admission H&P    Chief Complaint  Patient presents with   Altered Mental Status  : HPI: Amanda Porter is a 77 year old right-handed female with history of COPD with history of lung cancer status post upper left lobectomy, former smoker,, chronic anemia, anxiety/depression/dementia maintained on Namenda, CVA with left-sided deficits, diabetes mellitus type 2, hyperlipidemia, hypertension, tardive dyskinesia, recurrent UTIs, dementia, right THA 09/07/2022 per Dr. Harlow Mares, right VP shunt replacement for NPH about 5 months ago per Dr. Meade Maw.  Per chart review patient currently lives with her daughter and autistic grandchild.  Patient attends adult daycare.  Two-level home bed and bath on main level.  Dating back to December patient was modified independent ambulate with a rollator with steady decline since January requiring assist for transfers ambulating only short distances and family also reports increased confusion.  Family planning assist as needed on discharge.  Presented to [facility] 02/03/2023 with increasing confusion and gait disorder.  Cranial CT scan showed mixed density right cerebral convexity subdural hematoma significantly increased in size since 01/12/2023 currently measuring up to 2.4 cm with worsening mass effect and 1.3 cm rightward midline shift.  Stable position of right frontal ventricular catheter without evidence of hydrocephalus or trapped ventricle.  Patient underwent right frontal burr hole for drainage of subdural hematoma 02/03/2023 per Dr. [name] and VP shunt removed.  Latest cranial CT scan 02/09/2023 shows decreased size of right subdural collection now 6 mm decreased right to left midline shift now 4 mm.  Hospital course patient was cleared to resume low-dose aspirin.  Lovenox was added for DVT prophylaxis 02/05/2023.  Patient is tolerating a regular consistency diet.  Patient with chronic anemia with hemoglobin dating back to 12/26/2022  of 8.7 currently 8.0. Palliative care consulted to establish goals of care. Therapy evaluations completed due to patient's decreased functional mobility altered mental status was admitted for a comprehensive rehab program.  Review of Systems  Constitutional:  Positive for malaise/fatigue. Negative for chills and fever.  HENT:  Negative for hearing loss.   Eyes:  Positive for blurred vision. Negative for double vision.  Respiratory:  Positive for shortness of breath. Negative for cough and wheezing.   Cardiovascular:  Negative for chest pain, palpitations and leg swelling.  Gastrointestinal:  Positive for constipation. Negative for heartburn, nausea and vomiting.       GERD  Genitourinary:  Positive for urgency. Negative for dysuria.  Musculoskeletal:  Positive for joint pain and myalgias.       History of falls  Skin:  Negative for rash.  Neurological:  Positive for weakness.       Tardive dyskinesia  Psychiatric/Behavioral:  Positive for depression and memory loss.        Anxiety  All other systems reviewed and are negative.  Past Medical History:  Diagnosis Date   Acute respiratory failure requiring reintubation ([place]) 11/05/2021   Anemia    Anxiety    Anxiety and depression    COPD (chronic obstructive pulmonary disease) (H[facility])    CVA (cerebral vascular accident) ([place])    CVA (cerebral vascular accident) ([place])    L sided deficits   Dementia ([place])    Depression    Diabetes mellitus ([place])    Type 2   Diabetes mellitus without complication ([place])    Dysphagia    Dysphasia    Falls    GERD (gastroesophageal reflux disease)    HCAP (healthcare-associated pneumonia) 12/16/2022   History of lung cancer  Hyperlipidemia    Hypertension    Influenza A 12/13/2021   Memory disturbance 11/20/2017   Normal pressure hydrocephalus ([place]) 2018   Nose colonized with MRSA 08/25/2022   a.) noted on pre-surgical swab prior to VP shunt revision   Retinal detachment    Right   Right  carotid bruit 11/20/2017   Tardive dyskinesia 02/01/2020   Tardive dyskinesia    Thrombosis    Arterial to lower extremity?   Past Surgical History:  Procedure Laterality Date   BURR HOLE Right 02/03/2023   Procedure: BURR HOLE X 1, ventriculoperitoneal shunt removal;  Surgeon: Meade Maw, MD;  Location: [facility] ORS;  Service: Neurosurgery;  Laterality: Right;   ESOPHAGOGASTRODUODENOSCOPY (EGD) WITH PROPOFOL N/A 12/16/2022   Procedure: ESOPHAGOGASTRODUODENOSCOPY (EGD) WITH PROPOFOL;  Surgeon: Lucilla Lame, MD;  Location: [facility] END[facility]COPY;  Service: Endoscopy;  Laterality: N/A;   HIP ARTHROPLASTY Right 09/07/2022   Procedure: ARTHROPLASTY BIPOLAR HIP (HEMIARTHROPLASTY);  Surgeon: Lovell Sheehan, MD;  Location: [facility] ORS;  Service: Orthopedics;  Laterality: Right;   IR FL GUIDED LOC OF NEEDLE/CATH TIP FOR SPINAL INJECTION RT  07/25/2022   LUNG REMOVAL, PARTIAL     left upper lobe   SHUNT REVISION VENTRICULAR-PERITONEAL N/A 09/19/2022   Procedure: VENTRICULOPERITONEAL SHUNT REVISION;  Surgeon: Meade Maw, MD;  Location: [facility] OR[facility];  Service: Neurosurgery;  Laterality: N/A;   VENTRICULOPERITONEAL SHUNT  12/09/2016   VENTRICULOPERITONEAL SHUNT  10/2018   Family History  Problem Relation Age of Onset   Pneumonia Mother    Cancer Father    Hyperlipidemia Sister    Hypertension Sister    Heart disease Sister    Diabetes Sister    Hypertension Sister    Diabetes Sister    Pancreatic cancer Sister    Throat cancer Brother    Cancer Brother    Cancer Brother    Breast cancer Neg Hx    Social History:  reports that she quit smoking about 5 years ago. Her smoking use included cigarettes. She has a 25.00 pack-year smoking history. She has been exposed to tobacco smoke. She has never used smokeless tobacco. She reports that she does not currently use alcohol. She reports that she does not currently use drugs. Allergies:  Allergies  Allergen Reactions   Lexapro [Escitalopram Oxalate]  Other (See Comments)    Dizziness, nausea, fatigue   Neosporin  [Neomycin-Polymyxin-Gramicidin] Rash    (Neosporin)   Neosporin [Bacitracin-Polymyxin B] Rash   Medications Prior to Admission  Medication Sig Dispense Refill   acetaminophen (TYLENOL) 325 MG tablet Take 650 mg by mouth every 4 (four) hours as needed for mild pain.     albuterol (VENTOLIN HFA) 108 (90 Base) MCG/ACT inhaler INHALE 2 PUFFS BY MOUTH EVERY 4 HOURS AS NEEDED 18 g 12   Albuterol Sulfate 2.5 MG/0.5ML NEBU Inhale 2.5 mg into the lungs every 6 (six) hours as needed (shortness of breath).     ascorbic acid (VITAMIN C) 500 MG tablet Take 1 tablet (500 mg total) by mouth daily. 30 tablet 2   aspirin 81 MG chewable tablet Chew 1 tablet (81 mg total) by mouth daily. Hold for now and resume on 12/22/22     buPROPion (WELLBUTRIN XL) 300 MG 24 hr tablet Take 1 tablet (300 mg total) by mouth daily with breakfast. for anxiety and depression. 90 tablet 0   busPIRone (BUSPAR) 30 MG tablet Take 30 mg by mouth 2 (two) times daily.     Calcium Carb-Cholecalciferol (CALCIUM/VITAMIN D PO) Take 1 tablet  by mouth daily.     clonazePAM (KLONOPIN) 1 MG tablet Take 1 mg by mouth at bedtime.     conjugated estrogens (PREMARIN) vaginal cream Apply one pea-sized amount around the opening of the urethra daily for 2 weeks, then 3 times weekly moving forward. 30 g 4   ferrous sulfate 325 (65 FE) MG tablet Take 1 tablet (325 mg total) by mouth daily with breakfast. 30 tablet 2   folic acid (FOLVITE) 1 MG tablet Take 1 mg by mouth daily.     glipiZIDE (GLUCOTROL XL) 2.5 MG 24 hr tablet TAKE 1 TABLET (2.5 MG TOTAL) BY MOUTH DAILY WITH BREAKFAST. FOR DIABETES. 90 tablet 0   LACTOBACILLUS PROBIOTIC PO Take 1 capsule by mouth daily.     Melatonin 10 MG TABS Take 10 mg by mouth at bedtime.     memantine (NAMENDA) 5 MG tablet Take 1 tablet (5 mg total) by mouth 2 (two) times daily. For memory. 180 tablet 3   Menthol, Topical Analgesic, (BIOFREEZE) 4 % GEL  Apply topically in the morning and at bedtime.     pantoprazole (PROTONIX) 40 MG tablet Take 1 tablet (40 mg total) by mouth daily. 30 tablet 2   rosuvastatin (CRESTOR) 20 MG tablet TAKE 1 TABLET (20 MG TOTAL) BY MOUTH EVERY EVENING. FOR CHOLESTEROL. 90 tablet 3   Tiotropium Bromide-Olodaterol (STIOLTO RESPIMAT) 2.5-2.5 MCG/ACT AERS Inhale 2 each into the lungs in the morning.     Continuous Blood Gluc Sensor (FREESTYLE LIBRE 3 SENSOR) MISC Place 1 sensor on the skin every 14 days. Use to check glucose continuously 6 each 1   PROLIA 60 MG/ML SOSY injection Inject 60 mg into the skin every 6 (six) months.        Home: Home Living Family/patient expects to be discharged to:: Private residence Living Arrangements: Children, Other (Comment) (granddaughter) Available Help at Discharge: Family, Available PRN/intermittently (daughter arranging 24/7 support) Type of Home: House Home Access: Stairs to enter Technical brewer of Steps: 1 Entrance Stairs-Rails: None Home Layout: Able to live on main level with bedroom/bathroom Bathroom Shower/Tub: Chiropodist: Handicapped height Bathroom Accessibility: Yes Home Equipment: Rollator (4 wheels), Shower seat, Grab bars - tub/shower, Toilet riser, Wheelchair - manual Additional Comments: lives with daughter (works full time) and grand child (autistic)  Lives With: Daughter, Other (Comment) (granddaughter)   Functional History: Prior Function Prior Level of Function : Needs assist Mobility Comments: In December pt was modified independent ambulating with rollator but has had decline in status since January requiring assist for transfers and walking 10-20 feet (and 40-50 feet when going to adult day care).  Functional Status:  Mobility: Bed Mobility Overal bed mobility: Needs Assistance Bed Mobility: Supine to Sit, Sit to Supine Supine to sit: Mod assist Sit to supine: Min guard General bed mobility comments: NT in  recliner pre/post session Transfers Overall transfer level: Needs assistance Equipment used: 1 person hand held assist, Rolling walker (2 wheels) Transfers: Sit to/from Stand (5x with RW with MIN-MOD A; 5x with MIN A; pt able to sustain  static standing for no more than 20 seconds at attempts) Sit to Stand: Min assist General transfer comment: x5 sit<>stand with cues + MIN a to initiate lift off Ambulation/Gait Ambulation/Gait assistance: Min guard, Min assist Gait Distance (Feet):  (55) Assistive device: Rolling walker (2 wheels) Gait Pattern/deviations: Step-to pattern, Decreased step length - right, Decreased step length - left (trunk and upper body rotated to right during gait) General Gait Details:  Pt required assist to stay on task and keep RW moving. Gait velocity: decreased    ADL: ADL Overall ADL's : Needs assistance/impaired Grooming: Wash/dry face, Sitting, Supervision/safety Grooming Details (indicate cue type and reason): intermittent vcs for thoroughness General ADL Comments: MIN A for simulated BSC t/f, verbal and tactile cues to sequence steps along EOB. MIN A tooth brushing seated EOB, initial fair righting ractions however as pt fatigues requires MIN A sitting balance (R lateral lean).  Cognition: Cognition Overall Cognitive Status: Impaired/Different from baseline Orientation Level: Disoriented to situation, Disoriented to time Cognition Arousal/Alertness: Awake/alert Behavior During Therapy: Flat affect Overall Cognitive Status: Impaired/Different from baseline Area of Impairment: Memory, Following commands, Orientation, Attention, Awareness, Problem solving Orientation Level: Disoriented to, Time, Situation Current Attention Level: Focused Memory: Decreased recall of precautions, Decreased short-term memory Following Commands: Follows one step commands inconsistently Safety/Judgement: Decreased awareness of safety, Decreased awareness of deficits Problem  Solving: Slow processing, Decreased initiation, Difficulty sequencing, Requires tactile cues, Requires verbal cues General Comments: cues to initiate mobility, improved with increased alertness with activity.  Physical Exam: Blood pressure (!) 137/52, pulse 91, temperature 98.8 F (37.1 C), temperature source Oral, resp. rate 18, height 5\' 2"  (1.575 m), weight 44.3 kg, SpO2 91 %. Physical Exam HENT:     Head:     Comments: Bur hole incision site clean and dry Neurological:     Comments: Patient is awake.  Mood is a bit flat.  Makes eye contact with examiner.  Provides name but not year or place.  Follows simple commands.     Results for orders placed or performed during the hospital encounter of 02/03/23 (from the past 48 hour(s))  Glucose, capillary     Status: Abnormal   Collection Time: 02/11/23  8:03 AM  Result Value Ref Range   Glucose-Capillary 170 (H) 70 - 99 mg/dL    Comment: Glucose reference range applies only to samples taken after fasting for at least 8 hours.  Glucose, capillary     Status: Abnormal   Collection Time: 02/11/23 12:24 PM  Result Value Ref Range   Glucose-Capillary 179 (H) 70 - 99 mg/dL    Comment: Glucose reference range applies only to samples taken after fasting for at least 8 hours.  Glucose, capillary     Status: Abnormal   Collection Time: 02/11/23  4:04 PM  Result Value Ref Range   Glucose-Capillary 163 (H) 70 - 99 mg/dL    Comment: Glucose reference range applies only to samples taken after fasting for at least 8 hours.  Glucose, capillary     Status: Abnormal   Collection Time: 02/11/23  9:29 PM  Result Value Ref Range   Glucose-Capillary 108 (H) 70 - 99 mg/dL    Comment: Glucose reference range applies only to samples taken after fasting for at least 8 hours.  Glucose, capillary     Status: Abnormal   Collection Time: 02/12/23 12:58 AM  Result Value Ref Range   Glucose-Capillary 145 (H) 70 - 99 mg/dL    Comment: Glucose reference range  applies only to samples taken after fasting for at least 8 hours.  Glucose, capillary     Status: Abnormal   Collection Time: 02/12/23  4:13 AM  Result Value Ref Range   Glucose-Capillary 113 (H) 70 - 99 mg/dL    Comment: Glucose reference range applies only to samples taken after fasting for at least 8 hours.  Glucose, capillary     Status: Abnormal   Collection Time:  02/12/23  8:00 AM  Result Value Ref Range   Glucose-Capillary 121 (H) 70 - 99 mg/dL    Comment: Glucose reference range applies only to samples taken after fasting for at least 8 hours.  Glucose, capillary     Status: Abnormal   Collection Time: 02/12/23 11:50 AM  Result Value Ref Range   Glucose-Capillary 171 (H) 70 - 99 mg/dL    Comment: Glucose reference range applies only to samples taken after fasting for at least 8 hours.  Glucose, capillary     Status: Abnormal   Collection Time: 02/12/23  4:12 PM  Result Value Ref Range   Glucose-Capillary 102 (H) 70 - 99 mg/dL    Comment: Glucose reference range applies only to samples taken after fasting for at least 8 hours.  Glucose, capillary     Status: Abnormal   Collection Time: 02/12/23  8:36 PM  Result Value Ref Range   Glucose-Capillary 174 (H) 70 - 99 mg/dL    Comment: Glucose reference range applies only to samples taken after fasting for at least 8 hours.  Glucose, capillary     Status: Abnormal   Collection Time: 02/13/23 12:52 AM  Result Value Ref Range   Glucose-Capillary 140 (H) 70 - 99 mg/dL    Comment: Glucose reference range applies only to samples taken after fasting for at least 8 hours.  Glucose, capillary     Status: Abnormal   Collection Time: 02/13/23  5:19 AM  Result Value Ref Range   Glucose-Capillary 138 (H) 70 - 99 mg/dL    Comment: Glucose reference range applies only to samples taken after fasting for at least 8 hours.   No results found.    Blood pressure (!) 137/52, pulse 91, temperature 98.8 F (37.1 C), temperature source Oral,  resp. rate 18, height 5\' 2"  (1.575 m), weight 44.3 kg, SpO2 91 %.  Medical Problem List and Plan: 1. Functional deficits secondary to worsening subdural hematoma status post right frontal burr hole 02/03/2023 and VP shunt removed with  history of recent right VP shunt replacement for NPH approximately 5 months ago followed by neurosurgery Dr. 04/05/2023 as well as history of CVA with left-sided residual weakness.  -patient may *** shower  -ELOS/Goals: *** 2.  Antithrombotics: -DVT/anticoagulation:  Pharmaceutical: Lovenox initiated 02/05/2023  -antiplatelet therapy: Aspirin 81 mg daily 3. Pain Management: Tylenol as needed 4. Mood/Behavior/Sleep/anxiety/depression: Wellbutrin 300 mg daily, BuSpar 30 mg twice daily, Namenda 5 mg twice daily, melatonin 10 mg nightly  -antipsychotic agents: N/A 5. Neuropsych/cognition: This patient is not capable of making decisions on her own behalf. 6. Skin/Wound Care: Routine skin checks 7. Fluids/Electrolytes/Nutrition: Routine in and outs with follow-up chemistries 8.  Chronic COPD without exacerbation as well as history of lung cancer status post right upper lobe lobectomy, former smoker.  Supplemental oxygen as needed.  Continue inhalers as directed 9.  Hyperlipidemia.  Crestor 10.  Hypertension.  Currently on Norvasc 2.5 mg daily.  Blood pressure has been soft losartan currently on hold. 11.  Diabetes mellitus.  Hemoglobin A1c 6.8.  SSI/Glucotrol 2.5 mg daily 12.  Chronic anemia.  Continue iron supplement.  Follow-up CBC 13.  Recurrent UTIs.  Monitor for any dysuria or hematuria 14.  Right THA 09/07/2022.  Weightbearing as tolerated 15.  Nose colonized with MRSA.  Bactroban as indicated 16.  GERD.  Protonix  11/07/2022, PA-C 02/13/2023

## 2023-02-11 NOTE — Telephone Encounter (Signed)
Spoke with Amanda Porter she stated they were notified that the patient does qualify for hospice care but they would like to start by initiating palliative care once the patient is discharged from the hospital. They need to verbal okay from a provider to put this in place.

## 2023-02-11 NOTE — Telephone Encounter (Signed)
Verbal orders ok

## 2023-02-12 DIAGNOSIS — I10 Essential (primary) hypertension: Secondary | ICD-10-CM | POA: Diagnosis not present

## 2023-02-12 DIAGNOSIS — S065XAA Traumatic subdural hemorrhage with loss of consciousness status unknown, initial encounter: Secondary | ICD-10-CM | POA: Diagnosis not present

## 2023-02-12 DIAGNOSIS — G935 Compression of brain: Secondary | ICD-10-CM | POA: Diagnosis not present

## 2023-02-12 DIAGNOSIS — T8501XS Breakdown (mechanical) of ventricular intracranial (communicating) shunt, sequela: Secondary | ICD-10-CM | POA: Diagnosis not present

## 2023-02-12 LAB — GLUCOSE, CAPILLARY
Glucose-Capillary: 102 mg/dL — ABNORMAL HIGH (ref 70–99)
Glucose-Capillary: 113 mg/dL — ABNORMAL HIGH (ref 70–99)
Glucose-Capillary: 121 mg/dL — ABNORMAL HIGH (ref 70–99)
Glucose-Capillary: 145 mg/dL — ABNORMAL HIGH (ref 70–99)
Glucose-Capillary: 171 mg/dL — ABNORMAL HIGH (ref 70–99)
Glucose-Capillary: 174 mg/dL — ABNORMAL HIGH (ref 70–99)

## 2023-02-12 NOTE — Care Management Important Message (Signed)
Important Message  Patient Details  Name: Amanda Porter MRN: [id number] Date of Birth: 03-21-1946   Medicare Important Message Given:  Yes     Dannette Barbara 02/12/2023, 1:03 PM

## 2023-02-12 NOTE — Progress Notes (Signed)
Occupational Therapy Treatment Patient Details Name: Amanda Porter MRN: [id number] DOB: Aug 17, 1946 Today's Date: 02/12/2023   History of present illness Pt is a 77 y.o. female presenting to hospital with increased AMS (unable to follow commands and uncoordinated in L arm).  H/o hydrocephalus and dementia baseline.  Recent treatments for UTI's.  Prior brain bleed secondary to falls.  Imaging showing mixed density R cerebral convexity SDH (significantly increased in size since 01/12/23) with worsened mass effect with 1.3 cm rightward midline shift.  Pt s/p R frontal burr hole for drainage of SDH and removal of VP shunt 02/03/23.  Drain removed 02/04/23.  PMH includes R VP shunt for NPH (about 5 months prior), anemia, anxiety, COPD, CVA, dementia, DM, R retinal detachment, tardive dyskinesia, R THA.   OT comments  Chart reviewed, pt greeted in chair, agreeable to OT tx session. Pt is oriented to self and place, multi modal cueing required throughout for one step task participation. Tx session targeted improving functional activity tolerance for improved ADL task completion. Improvements noted throughout as evidenced by improving sitting balance with decreased noted lean, sustained standing with MIN A for no more than 20 seconds. Grooming tasks completed with supervision, vcs for thoroughness. Pt continues to make progress towards goals, discharge recommendation remains appropriate. OT will continue to follow acutely.    Recommendations for follow up therapy are one component of a multi-disciplinary discharge planning process, led by the attending physician.  Recommendations may be updated based on patient status, additional functional criteria and insurance authorization.    Follow Up Recommendations  Acute inpatient rehab (3hours/day)     Assistance Recommended at Discharge Intermittent Supervision/Assistance  Patient can return home with the following  A little help with walking and/or transfers;A little  help with bathing/dressing/bathroom;Help with stairs or ramp for entrance   Equipment Recommendations  BSC/3in1;Hospital bed    Recommendations for Other Services      Precautions / Restrictions Precautions Precautions: Fall Precaution Comments: s/p R frontal burr hole; monitor BP Restrictions Weight Bearing Restrictions: No       Mobility Bed Mobility               General bed mobility comments: NT in recliner pre/post session    Transfers Overall transfer level: Needs assistance Equipment used: 1 person hand held assist, Rolling walker (2 wheels) Transfers: Sit to/from Stand (5x with RW with MIN-MOD A; 5x with MIN A; pt able to sustain  static standing for no more than 20 seconds at attempts)                   Balance Overall balance assessment: Needs assistance Sitting-balance support: Single extremity supported, Feet supported Sitting balance-Leahy Scale: Fair     Standing balance support: Bilateral upper extremity supported, During functional activity Standing balance-Leahy Scale: Fair                             ADL either performed or assessed with clinical judgement   ADL Overall ADL's : Needs assistance/impaired     Grooming: Wash/dry face;Sitting;Supervision/safety Grooming Details (indicate cue type and reason): intermittent vcs for thoroughness                                    Extremity/Trunk Assessment              Vision  Perception     Praxis      Cognition Arousal/Alertness: Awake/alert Behavior During Therapy: Flat affect Overall Cognitive Status: Impaired/Different from baseline Area of Impairment: Memory, Following commands, Orientation, Attention, Awareness, Problem solving                 Orientation Level: Disoriented to, Time, Situation Current Attention Level: Focused Memory: Decreased recall of precautions, Decreased short-term memory Following Commands: Follows one  step commands inconsistently Safety/Judgement: Decreased awareness of safety, Decreased awareness of deficits   Problem Solving: Slow processing, Decreased initiation, Difficulty sequencing, Requires tactile cues, Requires verbal cues          Exercises      Shoulder Instructions       General Comments bistering noted on R side of head, RN notified    Pertinent Vitals/ Pain       Pain Assessment Pain Assessment: No/denies pain  Home Living                                          Prior Functioning/Environment              Frequency  Min 3X/week        Progress Toward Goals  OT Goals(current goals can now be found in the care plan section)  Progress towards OT goals: Progressing toward goals     Plan Discharge plan remains appropriate;Frequency remains appropriate    Co-evaluation                 AM-PAC OT "6 Clicks" Daily Activity     Outcome Measure   Help from another person eating meals?: A Little Help from another person taking care of personal grooming?: A Little Help from another person toileting, which includes using toliet, bedpan, or urinal?: A Lot Help from another person bathing (including washing, rinsing, drying)?: A Lot Help from another person to put on and taking off regular upper body clothing?: A Little Help from another person to put on and taking off regular lower body clothing?: A Lot 6 Click Score: 15    End of Session Equipment Utilized During Treatment: Rolling walker (2 wheels)  OT Visit Diagnosis: Other abnormalities of gait and mobility (R26.89);Muscle weakness (generalized) (M62.81)   Activity Tolerance Patient tolerated treatment well   Patient Left in chair;with call bell/phone within reach;with chair alarm set   Nurse Communication Mobility status        Time: [id number] OT Time Calculation (min): 19 min  Charges: OT General Charges $OT Visit: 1 Visit OT Treatments $Therapeutic  Activity: 8-22 mins  Shanon Payor, OTD OTR/L  02/12/23, 12:03 PM

## 2023-02-12 NOTE — Progress Notes (Addendum)
Inpatient Rehabilitation Admissions Coordinator   I have a CIR bed at [facility] in [place] to admit her to on Friday. I have left her daughter, [name], a voicemail, to call me to discuss her admission for Friday. Acute team and TOC made aware. I will follow up Friday am to arrange her admission. Please call me with any questions.  Danne Baxter, RN, MSN Rehab Admissions Coordinator [phone] 02/12/2023 9:59 AM  I spoke with [name], daughter, by phone and she is in agreement to admit Friday.  Danne Baxter, RN, MSN Rehab Admissions Coordinator [phone] 02/12/2023 11:22 AM

## 2023-02-12 NOTE — Progress Notes (Signed)
Progress Note   Patient: Amanda Porter [id number] DOB: 20-Mar-1946 DOA: 02/03/2023     9 DOS: the patient was seen and examined on 02/12/2023   Brief hospital course: Darbie A Depaulo is a 77 y.o. adult who comes in with concern for altered mental status.  Patient has a history of hydrocephalus and dementia at baseline but reports worsening altered mental status she is unable to follow commands and uncoordinated in her left arm patient has had some recent treatments for UTIs with 2 courses of different antibiotics.  Is also had prior brain bleed secondary to falls.    Per ER--Daughter reports that she has had acute change over the past 24 to 48 hours where she seems more altered and confused.  She seems to be having more hallucinations.  She is typically able to transfer and she is often able to do that.  She goes to an adult daycare but unfortunately she seemed to decline and they were concerned so that also was going on and recommended she come into the ER to be evaluated.  She did last have her hydrocephalus and VP shunt evaluation and fixed by Dr. [name] a few months ago   CT head without contrast 02/03/2023: Mixed density right cerebral convexity subdural hematoma is significantly increased in size since 01/12/2023, currently measuring up to 2.4 cm with worsened mass effect with 1.3 cm rightward midline shift.  Stable position of right frontal ventricular catheter without evidence of hydrocephalus or trapped ventricle.   3/13-3/14: Waiting for CIR bed availability  Assessment and Plan:  Worsening Subdural Hematoma with mass effect and rightward shift requiring emergent drain placement and VP shunt removal due to shunt malfunction - Neurosurgery consulted, appreciate input--s/p Shunt removal and drain placement post subdural drainage.  Drain removal on 02/04/2023, staples removed on 02/11/2023.  POD 8 -  PT/OT while here.  Waiting for CIR bed placement, removed foley and started DVT prophylaxis per  Dr Adonis Housekeeper - resumed Asa (81 mg) --Neurosurgery ok with d/c when bed available. --CT head repeated on 3/11 and was showing decrease in the size of subdural hematoma along with decrease right to left midline shift.--dter concerned about some right scalp deformed   Chronic COPD without exacerbation PMHx: Lung Cancer s/p upper Left Lobectomy, former smoker - Supplemental oxygen as needed, maintain SpO2 > 88% - Bronchodilators as needed She is on room air today   Acute Encephalopathy secondary to worsening subdural hematoma in the setting of baseline dementia Anxiety & Depression - restart outpatient memantine & melatonin on 3/6 -on Buspar & klonopin   Hypertension Hyperlipidemia - restart outpatient Crestor - consider restarting losartan as the patient stabilizes   Type 2 Diabetes Mellitus Steroid Induced Hyperglycemia Hemoglobin A1C: 6.8 - SSI sensitive dosing - target range while in ICU: 140-180   PT/OT eval noted. Pt will benefit for Acute inpatient rehab.  Await insurance approval and bed availability at CIR.  Hoping for tomorrow     Subjective: No new issues  Physical Exam: Vitals:   02/11/23 2126 02/12/23 0417 02/12/23 0606 02/12/23 0759  BP: (!) 134/54 (!) 126/52  (!) 124/56  Pulse: 96 83  80  Resp: '19 19  16  '$ Temp: 98.8 F (37.1 C) 98.4 F (36.9 C)  98.5 F (36.9 C)  TempSrc: Oral   Oral  SpO2: 100% 100%  97%  Weight:   44.3 kg   Height:       GENERAL:  77 y.o.-year-old patient with no acute distress.  LUNGS: Normal breath sounds bilaterally, no wheezing CARDIOVASCULAR: S1, S2 normal. No murmur   ABDOMEN: Soft, nontender, nondistended. Bowel sounds present.  EXTREMITIES: No  edema b/l.    NEUROLOGIC: nonfocal  patient is awake and alert, Scalp incision looks good, left UE weakness + Vesicular rash scalp and cheek-painless SKIN: No obvious rash, lesion, or ulcer.   Data Reviewed:  There are no new results to review at this time.  Family Communication: None  at bedside  Disposition: Status is: Inpatient Remains inpatient appropriate because: Medically stable.  Waiting for CIR bed availability  Planned Discharge Destination:  CIR   DVT prophylaxis-Lovenox Time spent: 25 minutes  Author: Max Sane, MD 02/12/2023 12:38 PM  For on call review [email or web address].

## 2023-02-12 NOTE — Progress Notes (Signed)
Pt tolerated session well. No c/o pain or dizziness with positional changes. Initial MinA to maintain sitting balance at EOB, improved with assisted repositioning of L UE. Pt able to follow single step commands however is easily distracted and requires verbal and tactile cues to re-engage in task. Good progress with increasing gait distance to 47f with RW. Pt progressing functionally each visit. Will continue PT acutely per POC.     02/12/23 1045  PT Visit Information  Assistance Needed +1  History of Present Illness Pt is a 77y.o. female presenting to hospital with increased AMS (unable to follow commands and uncoordinated in L arm).  H/o hydrocephalus and dementia baseline.  Recent treatments for UTI's.  Prior brain bleed secondary to falls.  Imaging showing mixed density R cerebral convexity SDH (significantly increased in size since 01/12/23) with worsened mass effect with 1.3 cm rightward midline shift.  Pt s/p R frontal burr hole for drainage of SDH and removal of VP shunt 02/03/23.  Drain removed 02/04/23.  PMH includes R VP shunt for NPH (about 5 months prior), anemia, anxiety, COPD, CVA, dementia, DM, R retinal detachment, tardive dyskinesia, R THA.  Subjective Data  Patient Stated Goal to improve mobility  Precautions  Precautions Fall  Precaution Comments s/p R frontal burr hole; monitor BP  Restrictions  Weight Bearing Restrictions No  Pain Assessment  Pain Assessment No/denies pain  Cognition  Arousal/Alertness Awake/alert  Behavior During Therapy Flat affect  Overall Cognitive Status Impaired/Different from baseline  Area of Impairment Memory;Following commands;Orientation  Orientation Level Disoriented to;Time;Situation  Current Attention Level Focused  Memory Decreased recall of precautions;Decreased short-term memory  Following Commands Follows one step commands inconsistently  Safety/Judgement Decreased awareness of safety;Decreased awareness of deficits  Problem Solving Slow  processing;Decreased initiation;Difficulty sequencing;Requires tactile cues;Requires verbal cues  General Comments cues to initiate mobility, improved with increased alertness with activity.  Bed Mobility  Overal bed mobility Needs Assistance  Bed Mobility Supine to Sit;Sit to Supine  Supine to sit Mod assist  General bed mobility comments does not initiate movement when asked  Transfers  Overall transfer level Needs assistance  Equipment used 1 person hand held assist  Transfers Sit to/from Stand  Sit to Stand Min assist  Ambulation/Gait  Ambulation/Gait assistance Min guard;Min assist  Gait Distance (Feet)  (55)  Assistive device Rolling walker (2 wheels)  Gait Pattern/deviations Step-to pattern;Decreased step length - right;Decreased step length - left (trunk and upper body rotated to right during gait)  General Gait Details Pt required assist to stay on task and keep RW moving.  Gait velocity decreased  Balance  Overall balance assessment Needs assistance  Sitting-balance support Single extremity supported;Feet supported  Sitting balance-Leahy Scale Fair  Sitting balance - Comments steady sitting reaching outside BOS with slow R UE reaching movments  Postural control Right lateral lean  Standing balance support Bilateral upper extremity supported;During functional activity  Standing balance-Leahy Scale Fair  Standing balance comment min gaurd/assist today with most tasks.  constant verbal and tactile cues needed.  General Comments  General comments (skin integrity, edema, etc.) Pt incontinent of urine in bed. Requires assist for Left hand placement on RW prior to standing  Exercises  Exercises General Lower Extremity  General Exercises - Lower Extremity  Hip Flexion/Marching AROM;Both;5 reps  Ankle Circles/Pumps AROM;Both;10 reps  Long Arc Quad AROM;Both;5 reps  PT - End of Session  Equipment Utilized During Treatment Gait belt  Activity Tolerance Patient tolerated  treatment well  Patient left in  chair;with call bell/phone within reach;with chair alarm set  Nurse Communication Mobility status   PT - Assessment/Plan  PT Plan Current plan remains appropriate  PT Visit Diagnosis Other abnormalities of gait and mobility (R26.89);Muscle weakness (generalized) (M62.81);History of falling (Z91.81)  PT Frequency (ACUTE ONLY) 7X/week  Follow Up Recommendations Acute inpatient rehab (3hours/day)  Assistance recommended at discharge Frequent or constant Supervision/Assistance  Patient can return home with the following A little help with walking and/or transfers;A little help with bathing/dressing/bathroom;Assistance with cooking/housework;Assist for transportation;Help with stairs or ramp for entrance  PT equipment Rolling walker ([phone]  AM-PAC PT "6 Clicks" Mobility Outcome Measure (Version 2)  Help needed turning from your back to your side while in a flat bed without using bedrails? 3  Help needed moving from lying on your back to sitting on the side of a flat bed without using bedrails? 2  Help needed moving to and from a bed to a chair (including a wheelchair)? 2  Help needed standing up from a chair using your arms (e.g., wheelchair or bedside chair)? 2  Help needed to walk in hospital room? 2  Help needed climbing 3-5 steps with a railing?  2  6 Click Score 13  Consider Recommendation of Discharge To: CIR/SNF/LTACH  Progressive Mobility  What is the highest level of mobility based on the progressive mobility assessment? Level 5 (Walks with assist in room/hall) - Balance while stepping forward/back and can walk in room with assist - Complete  Activity Ambulated with assistance in hallway  PT Time Calculation  PT Start Time (ACUTE ONLY) 1000  PT Stop Time (ACUTE ONLY) 1043  PT Time Calculation (min) (ACUTE ONLY) 43 min  PT General Charges  $$ ACUTE PT VISIT 1 Visit  PT Treatments  $Gait Training 8-22 mins  $Therapeutic Exercise 8-22 mins    Mikel Cella, PTA

## 2023-02-13 ENCOUNTER — Other Ambulatory Visit: Payer: Self-pay

## 2023-02-13 ENCOUNTER — Encounter (HOSPITAL_COMMUNITY): Payer: Self-pay | Admitting: Physical Medicine and Rehabilitation

## 2023-02-13 ENCOUNTER — Inpatient Hospital Stay (HOSPITAL_COMMUNITY)
Admission: RE | Admit: 2023-02-13 | Discharge: 2023-02-23 | DRG: 057 | Disposition: A | Discharge status: Home Health | Payer: Medicare PPO | Source: Intra-hospital | Attending: Physical Medicine and Rehabilitation | Admitting: Physical Medicine and Rehabilitation

## 2023-02-13 DIAGNOSIS — G935 Compression of brain: Secondary | ICD-10-CM | POA: Diagnosis not present

## 2023-02-13 DIAGNOSIS — R339 Retention of urine, unspecified: Secondary | ICD-10-CM | POA: Diagnosis not present

## 2023-02-13 DIAGNOSIS — Z902 Acquired absence of lung [part of]: Secondary | ICD-10-CM | POA: Diagnosis not present

## 2023-02-13 DIAGNOSIS — E785 Hyperlipidemia, unspecified: Secondary | ICD-10-CM | POA: Diagnosis present

## 2023-02-13 DIAGNOSIS — Z800 Family history of malignant neoplasm of digestive organs: Secondary | ICD-10-CM

## 2023-02-13 DIAGNOSIS — S065XAA Traumatic subdural hemorrhage with loss of consciousness status unknown, initial encounter: Secondary | ICD-10-CM | POA: Diagnosis not present

## 2023-02-13 DIAGNOSIS — Z85118 Personal history of other malignant neoplasm of bronchus and lung: Secondary | ICD-10-CM

## 2023-02-13 DIAGNOSIS — Z9981 Dependence on supplemental oxygen: Secondary | ICD-10-CM | POA: Diagnosis not present

## 2023-02-13 DIAGNOSIS — Z83438 Family history of other disorder of lipoprotein metabolism and other lipidemia: Secondary | ICD-10-CM | POA: Diagnosis not present

## 2023-02-13 DIAGNOSIS — I10 Essential (primary) hypertension: Secondary | ICD-10-CM

## 2023-02-13 DIAGNOSIS — N390 Urinary tract infection, site not specified: Secondary | ICD-10-CM | POA: Diagnosis present

## 2023-02-13 DIAGNOSIS — D509 Iron deficiency anemia, unspecified: Secondary | ICD-10-CM | POA: Diagnosis not present

## 2023-02-13 DIAGNOSIS — Z809 Family history of malignant neoplasm, unspecified: Secondary | ICD-10-CM | POA: Diagnosis not present

## 2023-02-13 DIAGNOSIS — Z808 Family history of malignant neoplasm of other organs or systems: Secondary | ICD-10-CM

## 2023-02-13 DIAGNOSIS — Z87891 Personal history of nicotine dependence: Secondary | ICD-10-CM | POA: Diagnosis not present

## 2023-02-13 DIAGNOSIS — R519 Headache, unspecified: Secondary | ICD-10-CM | POA: Diagnosis not present

## 2023-02-13 DIAGNOSIS — J449 Chronic obstructive pulmonary disease, unspecified: Secondary | ICD-10-CM | POA: Diagnosis not present

## 2023-02-13 DIAGNOSIS — G2401 Drug induced subacute dyskinesia: Secondary | ICD-10-CM | POA: Diagnosis present

## 2023-02-13 DIAGNOSIS — Z982 Presence of cerebrospinal fluid drainage device: Secondary | ICD-10-CM

## 2023-02-13 DIAGNOSIS — K219 Gastro-esophageal reflux disease without esophagitis: Secondary | ICD-10-CM | POA: Diagnosis present

## 2023-02-13 DIAGNOSIS — E119 Type 2 diabetes mellitus without complications: Secondary | ICD-10-CM | POA: Diagnosis present

## 2023-02-13 DIAGNOSIS — R21 Rash and other nonspecific skin eruption: Secondary | ICD-10-CM | POA: Diagnosis present

## 2023-02-13 DIAGNOSIS — R4189 Other symptoms and signs involving cognitive functions and awareness: Secondary | ICD-10-CM | POA: Diagnosis present

## 2023-02-13 DIAGNOSIS — Z8249 Family history of ischemic heart disease and other diseases of the circulatory system: Secondary | ICD-10-CM | POA: Diagnosis not present

## 2023-02-13 DIAGNOSIS — D72829 Elevated white blood cell count, unspecified: Secondary | ICD-10-CM | POA: Diagnosis not present

## 2023-02-13 DIAGNOSIS — F32A Depression, unspecified: Secondary | ICD-10-CM

## 2023-02-13 DIAGNOSIS — Z96641 Presence of right artificial hip joint: Secondary | ICD-10-CM | POA: Diagnosis present

## 2023-02-13 DIAGNOSIS — E1165 Type 2 diabetes mellitus with hyperglycemia: Secondary | ICD-10-CM | POA: Diagnosis present

## 2023-02-13 DIAGNOSIS — I69354 Hemiplegia and hemiparesis following cerebral infarction affecting left non-dominant side: Secondary | ICD-10-CM | POA: Diagnosis not present

## 2023-02-13 DIAGNOSIS — Z79899 Other long term (current) drug therapy: Secondary | ICD-10-CM

## 2023-02-13 DIAGNOSIS — Z7984 Long term (current) use of oral hypoglycemic drugs: Secondary | ICD-10-CM

## 2023-02-13 DIAGNOSIS — Z760 Encounter for issue of repeat prescription: Secondary | ICD-10-CM

## 2023-02-13 DIAGNOSIS — F0393 Unspecified dementia, unspecified severity, with mood disturbance: Secondary | ICD-10-CM | POA: Diagnosis present

## 2023-02-13 DIAGNOSIS — T8501XS Breakdown (mechanical) of ventricular intracranial (communicating) shunt, sequela: Secondary | ICD-10-CM | POA: Diagnosis not present

## 2023-02-13 DIAGNOSIS — Z833 Family history of diabetes mellitus: Secondary | ICD-10-CM | POA: Diagnosis not present

## 2023-02-13 DIAGNOSIS — F0394 Unspecified dementia, unspecified severity, with anxiety: Secondary | ICD-10-CM | POA: Diagnosis present

## 2023-02-13 DIAGNOSIS — F419 Anxiety disorder, unspecified: Secondary | ICD-10-CM

## 2023-02-13 DIAGNOSIS — Z7982 Long term (current) use of aspirin: Secondary | ICD-10-CM

## 2023-02-13 DIAGNOSIS — D649 Anemia, unspecified: Secondary | ICD-10-CM | POA: Diagnosis not present

## 2023-02-13 DIAGNOSIS — Z888 Allergy status to other drugs, medicaments and biological substances status: Secondary | ICD-10-CM

## 2023-02-13 DIAGNOSIS — I6381 Other cerebral infarction due to occlusion or stenosis of small artery: Secondary | ICD-10-CM | POA: Diagnosis not present

## 2023-02-13 DIAGNOSIS — Z79818 Long term (current) use of other agents affecting estrogen receptors and estrogen levels: Secondary | ICD-10-CM

## 2023-02-13 DIAGNOSIS — R29810 Facial weakness: Secondary | ICD-10-CM | POA: Diagnosis present

## 2023-02-13 DIAGNOSIS — Z87440 Personal history of urinary (tract) infections: Secondary | ICD-10-CM

## 2023-02-13 DIAGNOSIS — Z22322 Carrier or suspected carrier of Methicillin resistant Staphylococcus aureus: Secondary | ICD-10-CM

## 2023-02-13 DIAGNOSIS — N3000 Acute cystitis without hematuria: Secondary | ICD-10-CM | POA: Diagnosis not present

## 2023-02-13 LAB — GLUCOSE, CAPILLARY
Glucose-Capillary: 138 mg/dL — ABNORMAL HIGH (ref 70–99)
Glucose-Capillary: 139 mg/dL — ABNORMAL HIGH (ref 70–99)
Glucose-Capillary: 140 mg/dL — ABNORMAL HIGH (ref 70–99)
Glucose-Capillary: 146 mg/dL — ABNORMAL HIGH (ref 70–99)
Glucose-Capillary: 146 mg/dL — ABNORMAL HIGH (ref 70–99)
Glucose-Capillary: 170 mg/dL — ABNORMAL HIGH (ref 70–99)

## 2023-02-13 LAB — CBC
HCT: 27.2 % — ABNORMAL LOW (ref 36.0–46.0)
Hemoglobin: 8.4 g/dL — ABNORMAL LOW (ref 12.0–15.0)
MCH: 26.5 pg (ref 26.0–34.0)
MCHC: 30.9 g/dL (ref 30.0–36.0)
MCV: 85.8 fL (ref 80.0–100.0)
Platelets: 446 10*3/uL — ABNORMAL HIGH (ref 150–400)
RBC: 3.17 MIL/uL — ABNORMAL LOW (ref 3.87–5.11)
RDW: 15.7 % — ABNORMAL HIGH (ref 11.5–15.5)
WBC: 11.4 10*3/uL — ABNORMAL HIGH (ref 4.0–10.5)
nRBC: 0 % (ref 0.0–0.2)

## 2023-02-13 LAB — CREATININE, SERUM
Creatinine, Ser: 0.58 mg/dL (ref 0.44–1.00)
GFR, Estimated: >60 mL/min (ref >60)

## 2023-02-13 MED ORDER — HYDROCORTISONE 0.5 % EX CREA
TOPICAL_CREAM | Freq: Two times a day (BID) | CUTANEOUS | Status: DC
  Filled 2023-02-13: qty 28.35

## 2023-02-13 MED ORDER — DOCUSATE SODIUM 100 MG PO CAPS: 100.0000 mg | ORAL_CAPSULE | Freq: Two times a day (BID) | ORAL | Status: DC | PRN

## 2023-02-13 MED ORDER — MELATONIN 5 MG PO TABS
10.0000 mg | ORAL_TABLET | Freq: Every day | ORAL | Status: DC
  Administered 2023-02-13 – 2023-02-22 (×10): 10 mg via ORAL
  Filled 2023-02-13 (×10): qty 2

## 2023-02-13 MED ORDER — ROSUVASTATIN CALCIUM 20 MG PO TABS
20.0000 mg | ORAL_TABLET | Freq: Every evening | ORAL | Status: DC
  Administered 2023-02-13 – 2023-02-22 (×10): 20 mg via ORAL
  Filled 2023-02-13 (×10): qty 1

## 2023-02-13 MED ORDER — MEMANTINE HCL 5 MG PO TABS
5.0000 mg | ORAL_TABLET | Freq: Two times a day (BID) | ORAL | Status: DC
  Administered 2023-02-13 – 2023-02-23 (×20): 5 mg via ORAL
  Filled 2023-02-13 (×21): qty 1

## 2023-02-13 MED ORDER — FERROUS SULFATE 325 (65 FE) MG PO TABS
325.0000 mg | ORAL_TABLET | Freq: Every day | ORAL | Status: DC
  Administered 2023-02-14 – 2023-02-23 (×10): 325 mg via ORAL
  Filled 2023-02-13 (×10): qty 1

## 2023-02-13 MED ORDER — PANTOPRAZOLE SODIUM 40 MG PO TBEC
40.0000 mg | DELAYED_RELEASE_TABLET | Freq: Every day | ORAL | Status: DC
  Administered 2023-02-13 – 2023-02-22 (×10): 40 mg via ORAL
  Filled 2023-02-13 (×10): qty 1

## 2023-02-13 MED ORDER — ALBUTEROL SULFATE (2.5 MG/3ML) 0.083% IN NEBU
2.5000 mg | INHALATION_SOLUTION | RESPIRATORY_TRACT | Status: DC | PRN
  Filled 2023-02-13 (×2): qty 3

## 2023-02-13 MED ORDER — ENOXAPARIN SODIUM 30 MG/0.3ML IJ SOSY: 30.0000 mg | PREFILLED_SYRINGE | INTRAMUSCULAR | Status: DC

## 2023-02-13 MED ORDER — ENOXAPARIN SODIUM 30 MG/0.3ML IJ SOSY
30.0000 mg | PREFILLED_SYRINGE | INTRAMUSCULAR | Status: DC
  Administered 2023-02-13 – 2023-02-22 (×10): 30 mg via SUBCUTANEOUS
  Filled 2023-02-13 (×10): qty 0.3

## 2023-02-13 MED ORDER — ACETAMINOPHEN 325 MG PO TABS
650.0000 mg | ORAL_TABLET | ORAL | Status: DC | PRN
  Administered 2023-02-13 – 2023-02-22 (×19): 650 mg via ORAL
  Filled 2023-02-13 (×20): qty 2

## 2023-02-13 MED ORDER — POLYETHYLENE GLYCOL 3350 17 G PO PACK: 17.0000 g | PACK | Freq: Every day | ORAL | Status: DC | PRN

## 2023-02-13 MED ORDER — ASPIRIN 81 MG PO CHEW
81.0000 mg | CHEWABLE_TABLET | Freq: Every day | ORAL | Status: DC
  Administered 2023-02-14 – 2023-02-23 (×10): 81 mg via ORAL
  Filled 2023-02-13 (×10): qty 1

## 2023-02-13 MED ORDER — FOLIC ACID 1 MG PO TABS
1.0000 mg | ORAL_TABLET | Freq: Every day | ORAL | Status: DC
  Administered 2023-02-14 – 2023-02-23 (×10): 1 mg via ORAL
  Filled 2023-02-13 (×10): qty 1

## 2023-02-13 MED ORDER — ADULT MULTIVITAMIN W/MINERALS CH
1.0000 | ORAL_TABLET | Freq: Every day | ORAL | Status: DC
  Administered 2023-02-14 – 2023-02-23 (×10): 1 via ORAL
  Filled 2023-02-13 (×10): qty 1

## 2023-02-13 MED ORDER — BUPROPION HCL ER (XL) 300 MG PO TB24
300.0000 mg | ORAL_TABLET | Freq: Every day | ORAL | Status: DC
  Administered 2023-02-14 – 2023-02-23 (×10): 300 mg via ORAL
  Filled 2023-02-13 (×10): qty 1

## 2023-02-13 MED ORDER — BUSPIRONE HCL 15 MG PO TABS
30.0000 mg | ORAL_TABLET | Freq: Two times a day (BID) | ORAL | Status: DC
  Administered 2023-02-13 – 2023-02-23 (×20): 30 mg via ORAL
  Filled 2023-02-13 (×20): qty 2

## 2023-02-13 MED ORDER — ENSURE ENLIVE PO LIQD
237.0000 mL | Freq: Two times a day (BID) | ORAL | Status: DC
  Administered 2023-02-14 – 2023-02-21 (×10): 237 mL via ORAL

## 2023-02-13 MED ORDER — ARFORMOTEROL TARTRATE 15 MCG/2ML IN NEBU
15.0000 ug | INHALATION_SOLUTION | Freq: Two times a day (BID) | RESPIRATORY_TRACT | Status: DC
  Administered 2023-02-13 – 2023-02-22 (×10): 15 ug via RESPIRATORY_TRACT
  Filled 2023-02-13 (×17): qty 2

## 2023-02-13 MED ORDER — VITAMIN C 500 MG PO TABS
500.0000 mg | ORAL_TABLET | Freq: Every day | ORAL | Status: DC
  Administered 2023-02-14 – 2023-02-23 (×10): 500 mg via ORAL
  Filled 2023-02-13 (×10): qty 1

## 2023-02-13 MED ORDER — FLORANEX PO PACK
1.0000 g | PACK | Freq: Every day | ORAL | Status: DC
  Administered 2023-02-14 – 2023-02-23 (×10): 1 g via ORAL
  Filled 2023-02-13 (×10): qty 1

## 2023-02-13 MED ORDER — MUPIROCIN 2 % EX OINT
TOPICAL_OINTMENT | Freq: Two times a day (BID) | CUTANEOUS | Status: DC
  Filled 2023-02-13: qty 22

## 2023-02-13 MED ORDER — GLIPIZIDE ER 2.5 MG PO TB24
2.5000 mg | ORAL_TABLET | Freq: Every day | ORAL | Status: DC
  Administered 2023-02-14 – 2023-02-23 (×10): 2.5 mg via ORAL
  Filled 2023-02-13 (×10): qty 1

## 2023-02-13 MED ORDER — UMECLIDINIUM BROMIDE 62.5 MCG/ACT IN AEPB
1.0000 | INHALATION_SPRAY | Freq: Every day | RESPIRATORY_TRACT | Status: DC
  Administered 2023-02-14 – 2023-02-22 (×9): 1 via RESPIRATORY_TRACT
  Filled 2023-02-13: qty 7

## 2023-02-13 MED ORDER — INSULIN ASPART 100 UNIT/ML IJ SOLN
0.0000 [IU] | Freq: Three times a day (TID) | INTRAMUSCULAR | Status: DC
  Administered 2023-02-13: 1 [IU] via SUBCUTANEOUS
  Administered 2023-02-14: 2 [IU] via SUBCUTANEOUS
  Administered 2023-02-14: 1 [IU] via SUBCUTANEOUS
  Administered 2023-02-14 – 2023-02-15 (×2): 2 [IU] via SUBCUTANEOUS
  Administered 2023-02-15: 3 [IU] via SUBCUTANEOUS
  Administered 2023-02-15 – 2023-02-16 (×2): 2 [IU] via SUBCUTANEOUS
  Administered 2023-02-16 – 2023-02-17 (×2): 1 [IU] via SUBCUTANEOUS
  Administered 2023-02-17: 3 [IU] via SUBCUTANEOUS
  Administered 2023-02-18: 1 [IU] via SUBCUTANEOUS
  Administered 2023-02-18 (×2): 2 [IU] via SUBCUTANEOUS
  Administered 2023-02-19 (×2): 1 [IU] via SUBCUTANEOUS
  Administered 2023-02-19: 3 [IU] via SUBCUTANEOUS
  Administered 2023-02-20: 2 [IU] via SUBCUTANEOUS
  Administered 2023-02-21: 1 [IU] via SUBCUTANEOUS
  Administered 2023-02-21: 3 [IU] via SUBCUTANEOUS
  Administered 2023-02-22: 1 [IU] via SUBCUTANEOUS
  Administered 2023-02-22 (×2): 2 [IU] via SUBCUTANEOUS
  Administered 2023-02-23: 1 [IU] via SUBCUTANEOUS

## 2023-02-13 MED ORDER — AMLODIPINE BESYLATE 2.5 MG PO TABS
2.5000 mg | ORAL_TABLET | Freq: Every day | ORAL | Status: DC
  Administered 2023-02-14 – 2023-02-23 (×10): 2.5 mg via ORAL
  Filled 2023-02-13 (×10): qty 1

## 2023-02-13 NOTE — Progress Notes (Addendum)
Inpatient Rehabilitation Admissions Coordinator   I have CIR bed at [facility] in [place] to admit her to today. I will verify specific room number and when it will be ready asap. I will arrange [facility] transport with [facility] staff to complete [facility] paperwork for transport. I will notify daughter of the plans. Acute team and TOC made aware.  Danne Baxter, RN, MSN Rehab Admissions Coordinator [phone] 02/13/2023 10:20 AM  She will be admitted to [facility] with Dr Duanne Guess Admitting MD. [facility] contacted and Ive requested pickup at 1230.

## 2023-02-13 NOTE — Consult Note (Signed)
   [facility] CM Inpatient Consult   02/13/2023  Amanda Porter 1946-03-29 [id number]  Orientation with Natividad Brood, [facility] Liaison for review.   Location: [facility] Liaison screening remotely [facility]).   [place] [facility]) [place] Patient: Insurance [facility]).    Primary Care Provider:  Pleas Koch, NP with [facility]   Patient (pt) screened for noted discharge disposition and length of stay with hospitalization with noted extreme high risk score for unplanned readmission. Patient assessed for potential [place] [facility]) Care Management service for post hospital transition for care coordination. Pt will transition from [facility] to [facility] CIR per EMR for ongoing rehab. Pt also has [facility].    Plan: Pt noted to transfer today to the CIR facility.   [place] does not replace or interfere with any arrangements made by the Inpatient Transition of Care team.   For questions contact:    Raina Mina, RN, [place] Hours M-F 8:00 am to 5 pm [phone] mobile [phone] [Office toll free line][facility] Office Hours are M-F 8:30 - 5 pm 24 hour nurse advise line [phone] Conceirge  [email or web address]

## 2023-02-13 NOTE — Progress Notes (Signed)
Patient OOF in stable condition via EMS with [facility]

## 2023-02-13 NOTE — Discharge Summary (Signed)
Physician Discharge Summary   Patient: Amanda Porter MRN: [id number] DOB: 04/24/1946  Admit date:     02/03/2023  Discharge date: 02/13/23  Discharge Physician: Max Sane   PCP: Pleas Koch, NP   Recommendations at discharge:    F/up with outpt providers as requested  Discharge Diagnoses: Active Problems:   Primary hypertension   Subdural hematoma (HCC)   Compression of brain (HCC)   Complication of ventricular intracranial shunt   Altered mental status  Resolved Problems:   * No resolved hospital problems. *  Hospital Course: Amanda Porter is a 77 y.o. adult who comes in with concern for altered mental status.  Patient has a history of hydrocephalus and dementia at baseline but reports worsening altered mental status she is unable to follow commands and uncoordinated in her left arm patient has had some recent treatments for UTIs with 2 courses of different antibiotics.  Is also had prior brain bleed secondary to falls.    Per ER--Daughter reports that she has had acute change over the past 24 to 48 hours where she seems more altered and confused.  She seems to be having more hallucinations.  She is typically able to transfer and she is often able to do that.  She goes to an adult daycare but unfortunately she seemed to decline and they were concerned so that also was going on and recommended she come into the ER to be evaluated.  She did last have her hydrocephalus and VP shunt evaluation and fixed by Dr. [name] a few months ago   CT head without contrast 02/03/2023: Mixed density right cerebral convexity subdural hematoma is significantly increased in size since 01/12/2023, currently measuring up to 2.4 cm with worsened mass effect with 1.3 cm rightward midline shift.  Stable position of right frontal ventricular catheter without evidence of hydrocephalus or trapped ventricle.   3/13-3/14: Waiting for CIR bed availability  Assessment and Plan:  Subdural Hematoma with mass  effect and rightward shift requiring emergent drain placement and VP shunt removal due to shunt malfunction - Neurosurgery consulted, appreciate input--s/p Shunt removal and drain placement post subdural drainage.  Drain removal on 02/04/2023, staples removed on 02/11/2023.  POD 10 - resumed Asa (81 mg) --Neurosurgery ok with d/c  --CT head repeated on 3/11 and was showing decrease in the size of subdural hematoma along with decrease right to left midline shift.--dter concerned about some right scalp deformed   Chronic COPD without exacerbation Lung Cancer s/p upper Left Lobectomy, former smoker Acute Encephalopathy secondary to worsening subdural hematoma in the setting of baseline dementia Anxiety & Depression Hypertension Hyperlipidemia Type 2 Diabetes Mellitus/ Steroid Induced Hyperglycemia Hemoglobin A1C: 6.8        Consultants: Neurosurgery Procedures performed: s/p Shunt removal and drain placement post subdural drainage.  Drain removal on 02/04/2023,   Disposition:  CIR Diet recommendation:  Carb modified diet DISCHARGE MEDICATION: Allergies as of 02/13/2023       Reactions   Lexapro [escitalopram Oxalate] Other (See Comments)   Dizziness, nausea, fatigue   Neosporin  [neomycin-polymyxin-gramicidin] Rash   (Neosporin)   Neosporin [bacitracin-polymyxin B] Rash        Medication List     TAKE these medications    acetaminophen 325 MG tablet Commonly known as: TYLENOL Take 650 mg by mouth every 4 (four) hours as needed for mild pain.   Albuterol Sulfate 2.5 MG/0.5ML Nebu Inhale 2.5 mg into the lungs every 6 (six) hours as needed (shortness of  breath).   albuterol 108 (90 Base) MCG/ACT inhaler Commonly known as: VENTOLIN HFA INHALE 2 PUFFS BY MOUTH EVERY 4 HOURS AS NEEDED   amLODipine 2.5 MG tablet Commonly known as: NORVASC TAKE 1 TABLET (2.5 MG TOTAL) BY MOUTH DAILY FOR BLOOD PRESSURE   ascorbic acid 500 MG tablet Commonly known as: VITAMIN C Take 1 tablet  (500 mg total) by mouth daily.   aspirin 81 MG chewable tablet Chew 1 tablet (81 mg total) by mouth daily. Hold for now and resume on 12/22/22   Biofreeze 4 % Gel Generic drug: Menthol (Topical Analgesic) Apply topically in the morning and at bedtime.   buPROPion 300 MG 24 hr tablet Commonly known as: WELLBUTRIN XL Take 1 tablet (300 mg total) by mouth daily with breakfast. for anxiety and depression.   busPIRone 30 MG tablet Commonly known as: BUSPAR Take 30 mg by mouth 2 (two) times daily.   CALCIUM/VITAMIN D PO Take 1 tablet by mouth daily.   clonazePAM 1 MG tablet Commonly known as: KLONOPIN Take 1 mg by mouth at bedtime.   ferrous sulfate 325 (65 FE) MG tablet Take 1 tablet (325 mg total) by mouth daily with breakfast.   folic acid 1 MG tablet Commonly known as: FOLVITE Take 1 mg by mouth daily.   FreeStyle Libre 3 Sensor Misc Place 1 sensor on the skin every 14 days. Use to check glucose continuously   glipiZIDE 2.5 MG 24 hr tablet Commonly known as: GLUCOTROL XL TAKE 1 TABLET (2.5 MG TOTAL) BY MOUTH DAILY WITH BREAKFAST. FOR DIABETES.   LACTOBACILLUS PROBIOTIC PO Take 1 capsule by mouth daily.   Melatonin 10 MG Tabs Take 10 mg by mouth at bedtime.   memantine 5 MG tablet Commonly known as: NAMENDA Take 1 tablet (5 mg total) by mouth 2 (two) times daily. For memory.   pantoprazole 40 MG tablet Commonly known as: PROTONIX Take 1 tablet (40 mg total) by mouth daily.   Premarin vaginal cream Generic drug: conjugated estrogens Apply one pea-sized amount around the opening of the urethra daily for 2 weeks, then 3 times weekly moving forward.   Prolia 60 MG/ML Sosy injection Generic drug: denosumab Inject 60 mg into the skin every 6 (six) months.   rosuvastatin 20 MG tablet Commonly known as: CRESTOR TAKE 1 TABLET (20 MG TOTAL) BY MOUTH EVERY EVENING. FOR CHOLESTEROL.   Stiolto Respimat 2.5-2.5 MCG/ACT Aers Generic drug: Tiotropium  Bromide-Olodaterol Inhale 2 each into the lungs in the morning.        Discharge Exam: Filed Weights   02/11/23 0413 02/12/23 0606 02/13/23 [id number]  Weight: 46.6 kg 44.3 kg 41.3 kg   GENERAL:  77 y.o.-year-old patient with no acute distress.  LUNGS: Normal breath sounds bilaterally, no wheezing CARDIOVASCULAR: S1, S2 normal. No murmur   ABDOMEN: Soft, nontender, nondistended. Bowel sounds present.  EXTREMITIES: No  edema b/l.    NEUROLOGIC: nonfocal  patient is awake and alert, Scalp incision looks good, left UE weakness + Vesicular rash scalp and cheek-painless SKIN: No obvious rash, lesion, or ulcer.   Condition at discharge: fair  The results of significant diagnostics from this hospitalization (including imaging, microbiology, ancillary and laboratory) are listed below for reference.   Imaging Studies: CT HEAD WO CONTRAST (5MM)  Result Date: 02/09/2023 CLINICAL DATA:  Altered mental status, status post right subdural hematoma drainage EXAM: CT HEAD WITHOUT CONTRAST TECHNIQUE: Contiguous axial images were obtained from the base of the skull through the vertex without intravenous contrast.  RADIATION DOSE REDUCTION: This exam was performed according to the departmental dose-optimization program which includes automated exposure control, adjustment of the mA and/or kV according to patient size and/or use of iterative reconstruction technique. COMPARISON:  02/04/2023 FINDINGS: Brain: Interval removal of a previously noted right-sided drain. The right subdural collection now measures up to 6 mm (series 3, image 14), compared to a 2.0 cm width collection that was primarily air on prior exam. A small amount of air remains within the collection. Approximately 4 mm of right-to-left midline shift, previously 7 mm. No acute infarct, hemorrhage, or mass. Ventricular size and configuration is similar to a study from 01/07/2023. The previously noted ventriculostomy catheter is not present. No new  extra-axial collection. Remote infarct in the right thalamus. Periventricular white matter changes, likely the sequela of chronic small vessel ischemic disease. Vascular: No hyperdense vessel. Skull: Right frontal and anterior parietal burr holes. Superficial skin staples. No acute fracture or suspicious osseous lesion. Sinuses/Orbits: Clear paranasal sinuses. No acute finding in the orbits. Other: The mastoids are well aerated. IMPRESSION: 1. Interval removal of the previously noted right-sided drain, with decreased size of the right subdural collection, now 6 mm, and decreased right to left midline shift, now 4 mm. 2. Ventricular size and configuration is similar to a study from 01/07/2023. Electronically Signed   By: Merilyn Baba M.D.   On: 02/09/2023 21:47   CT HEAD WO CONTRAST (5MM)  Result Date: 02/04/2023 CLINICAL DATA:  77 year old female with increased right side subdural hematoma. Postoperative day 1 status post right frontal burr hole drainage of subdural hematoma. History of right side CSF shunt. EXAM: CT HEAD WITHOUT CONTRAST TECHNIQUE: Contiguous axial images were obtained from the base of the skull through the vertex without intravenous contrast. RADIATION DOSE REDUCTION: This exam was performed according to the departmental dose-optimization program which includes automated exposure control, adjustment of the mA and/or kV according to patient size and/or use of iterative reconstruction technique. COMPARISON:  Head CT yesterday and earlier. FINDINGS: Brain: Right frontal approach CSF shunt, reservoir and subcutaneous catheter have been removed. New right superior vertex burr hole and percutaneous subdural drain placed. Subtotal drainage of the mixed density right subdural hematoma since yesterday. But postoperative gas now within the non collapsed right side subdural space. Trace residual posterior convexity subdural fluid adjacent to the indwelling drain on series 3, image 20. Small volume right  frontal horn pneumo ventricle and trace left layering IVH (series 3, image 18) following CSF shunt removal. Decreased intracranial mass effect. Less mass effect on the right lateral ventricle. Leftward midline shift has decreased to 6-7 mm (previously up to 13 mm). Normalized suprasellar cistern. Other basilar cisterns are patent. No evidence of trapped ventricle. Chronic right thalamic lacunar infarct. Patchy and confluent cerebral white matter hypodensity. No cortically based acute infarct identified. Vascular: Calcified atherosclerosis at the skull base. Skull: New right vertex burr hole superimposed on previous shunt related burr hole. Elsewhere the skull appears intact. Sinuses/Orbits: Visualized paranasal sinuses and mastoids are stable and well aerated. Other: New postoperative changes to the right scalp vertex. Skin staples in place. Orbits appear negative. IMPRESSION: 1. Postoperative subtotal drainage of the mixed density right subdural fluid. Postoperative gas now occupying the non-collapsed right subdural space, with percutaneous subdural drain in place. 2. Small volume pneumo-ventricle and trace dependent intraventricular blood following CSF shunt removal. Improved lateral ventricle patency and regressed leftward midline shift (now 6-7 mm). 3. Normalized basilar cisterns. Chronic cerebral small vessel disease. Electronically Signed  By: Genevie Ann M.D.   On: 02/04/2023 06:08   DG Abd 1 View  Result Date: 02/03/2023 CLINICAL DATA:  Altered mental status, evaluate shunt integrity EXAM: ABDOMEN - 1 VIEW COMPARISON:  None Available. FINDINGS: Scattered large and small bowel gas is noted. Mild retained fecal material is noted within the colon. The visualized shunt catheter extends into the right hemipelvis. No kinking is noted. The catheter is intact. Right hip replacement is seen. IMPRESSION: Shunt catheter is noted to be intact. Electronically Signed   By: Inez Catalina M.D.   On: 02/03/2023 20:45   DG  Chest 1 View  Result Date: 02/03/2023 CLINICAL DATA:  Altered mental status and possible shunt malfunction EXAM: PORTABLE CHEST 1 VIEW COMPARISON:  01/11/2023 FINDINGS: Cardiac shadows within normal limits. Postsurgical changes are noted in left hilum. Lungs are well aerated bilaterally. Minimal basilar atelectasis is seen. No bony abnormality is noted. Shunt catheter is well visualized and intact. IMPRESSION: Intact shunt catheter. Mild bibasilar atelectasis. Electronically Signed   By: Inez Catalina M.D.   On: 02/03/2023 20:44   DG Skull 1-3 Views  Result Date: 02/03/2023 CLINICAL DATA:  Evaluate for shunt malfunction EXAM: SKULL - 1-3 VIEW COMPARISON:  09/06/2022 FINDINGS: New shunt since prior, tip from right parietal approach in just left of midline. Where visible and radiopaque the shunt is continuous. No underlying osseous finding. IMPRESSION: Unremarkable shunt tubing over the head. Electronically Signed   By: Jorje Guild M.D.   On: 02/03/2023 20:42   DG Cervical Spine 1 View  Result Date: 02/03/2023 CLINICAL DATA:  Altered mental status and possible shunt malfunction, evaluate integrity EXAM: DG CERVICAL SPINE - 1 VIEW COMPARISON:  09/06/2022 FINDINGS: Right-sided shunt catheter is again identified and stable. IMPRESSION: Stable appearing right shunt catheter. Electronically Signed   By: Inez Catalina M.D.   On: 02/03/2023 20:42   CT Head Wo Contrast  Result Date: 02/03/2023 CLINICAL DATA:  Altered mental status. History of hydrocephalus and dementia at baseline but worsening. Uncoordinated left arm. EXAM: CT HEAD WITHOUT CONTRAST CT CERVICAL SPINE WITHOUT CONTRAST TECHNIQUE: Multidetector CT imaging of the head and cervical spine was performed following the standard protocol without intravenous contrast. Multiplanar CT image reconstructions of the cervical spine were also generated. RADIATION DOSE REDUCTION: This exam was performed according to the departmental dose-optimization program which  includes automated exposure control, adjustment of the mA and/or kV according to patient size and/or use of iterative reconstruction technique. COMPARISON:  CT head 01/12/2023, CT head and cervical spine 01/11/2023 FINDINGS: CT HEAD FINDINGS Brain: A right frontal ventricular catheter is stable in position terminating in the right lateral ventricle near the midline. The mixed density right cerebral convexity subdural hematoma is significantly increased in size since 01/12/2023, currently measuring up to 2.4 cm in maximal thickness in the coronal plane, previously measured up to 1.0 cm. There is worsened mass effect with sulcal effacement, near-complete effacement of the right lateral ventricle, and 1.3 cm rightward midline shift. The basal cisterns remain patent. The left lateral ventricle is enlarged, similar to the prior CT. There is no convincing evidence of developing trapped ventricle. A remote infarct in the right thalamus and patchy additional hypodensity in the remainder of the supratentorial white matter are unchanged. Gray-white differentiation is preserved. Vascular: There is calcification of the bilateral carotid siphons. Skull: Normal. Negative for fracture or focal lesion. Sinuses/Orbits: The imaged paranasal sinuses are clear. The imaged globes and orbits are unremarkable. Other: None. CT CERVICAL SPINE FINDINGS Alignment:  There is unchanged reversal of the normal cervical curvature. There is no antero or retrolisthesis. There is no jumped or perched facet or other evidence of traumatic malalignment. Skull base and vertebrae: Skull base alignment is maintained. Vertebral body heights are preserved. There is no evidence of acute fracture. There is no suspicious osseous lesion. Soft tissues and spinal canal: No prevertebral fluid or swelling. No visible canal hematoma. Disc levels: Multilevel degenerative changes of the cervical spine are stable since the prior study with disc space narrowing most  advanced at C5-C6 and C6-C7. There is no evidence of high-grade spinal canal stenosis. Upper chest: The left upper lobe nodule is incompletely imaged on the current study common better evaluated on recent CT chest from 01/07/2023. Other: None. IMPRESSION: 1. Mixed density right cerebral convexity subdural hematoma is significantly increased in size since 01/12/2023, currently measuring up to 2.4 cm with worsened mass effect with 1.3 cm rightward midline shift. 2. Stable position of the right frontal ventricular catheter without evidence of hydrocephalus or trapped ventricle 3. No acute fracture or traumatic malalignment of the cervical spine. Critical Value/emergent results were called by telephone at the time of interpretation on 02/03/2023 at 7:55 pm to provider [facility] , who verbally acknowledged these results. Electronically Signed   By: Valetta Mole M.D.   On: 02/03/2023 19:58   CT Cervical Spine Wo Contrast  Result Date: 02/03/2023 CLINICAL DATA:  Altered mental status. History of hydrocephalus and dementia at baseline but worsening. Uncoordinated left arm. EXAM: CT HEAD WITHOUT CONTRAST CT CERVICAL SPINE WITHOUT CONTRAST TECHNIQUE: Multidetector CT imaging of the head and cervical spine was performed following the standard protocol without intravenous contrast. Multiplanar CT image reconstructions of the cervical spine were also generated. RADIATION DOSE REDUCTION: This exam was performed according to the departmental dose-optimization program which includes automated exposure control, adjustment of the mA and/or kV according to patient size and/or use of iterative reconstruction technique. COMPARISON:  CT head 01/12/2023, CT head and cervical spine 01/11/2023 FINDINGS: CT HEAD FINDINGS Brain: A right frontal ventricular catheter is stable in position terminating in the right lateral ventricle near the midline. The mixed density right cerebral convexity subdural hematoma is significantly increased in size  since 01/12/2023, currently measuring up to 2.4 cm in maximal thickness in the coronal plane, previously measured up to 1.0 cm. There is worsened mass effect with sulcal effacement, near-complete effacement of the right lateral ventricle, and 1.3 cm rightward midline shift. The basal cisterns remain patent. The left lateral ventricle is enlarged, similar to the prior CT. There is no convincing evidence of developing trapped ventricle. A remote infarct in the right thalamus and patchy additional hypodensity in the remainder of the supratentorial white matter are unchanged. Gray-white differentiation is preserved. Vascular: There is calcification of the bilateral carotid siphons. Skull: Normal. Negative for fracture or focal lesion. Sinuses/Orbits: The imaged paranasal sinuses are clear. The imaged globes and orbits are unremarkable. Other: None. CT CERVICAL SPINE FINDINGS Alignment: There is unchanged reversal of the normal cervical curvature. There is no antero or retrolisthesis. There is no jumped or perched facet or other evidence of traumatic malalignment. Skull base and vertebrae: Skull base alignment is maintained. Vertebral body heights are preserved. There is no evidence of acute fracture. There is no suspicious osseous lesion. Soft tissues and spinal canal: No prevertebral fluid or swelling. No visible canal hematoma. Disc levels: Multilevel degenerative changes of the cervical spine are stable since the prior study with disc space narrowing most advanced  at C5-C6 and C6-C7. There is no evidence of high-grade spinal canal stenosis. Upper chest: The left upper lobe nodule is incompletely imaged on the current study common better evaluated on recent CT chest from 01/07/2023. Other: None. IMPRESSION: 1. Mixed density right cerebral convexity subdural hematoma is significantly increased in size since 01/12/2023, currently measuring up to 2.4 cm with worsened mass effect with 1.3 cm rightward midline shift. 2.  Stable position of the right frontal ventricular catheter without evidence of hydrocephalus or trapped ventricle 3. No acute fracture or traumatic malalignment of the cervical spine. Critical Value/emergent results were called by telephone at the time of interpretation on 02/03/2023 at 7:55 pm to provider [facility] , who verbally acknowledged these results. Electronically Signed   By: Valetta Mole M.D.   On: 02/03/2023 19:58    Microbiology: Results for orders placed or performed during the hospital encounter of 02/03/23  Resp panel by RT-PCR (RSV, Flu A&B, Covid) Anterior Nasal Swab     Status: None   Collection Time: 02/03/23  9:19 PM   Specimen: Anterior Nasal Swab  Result Value Ref Range Status   SARS Coronavirus 2 by RT PCR NEGATIVE NEGATIVE Final    Comment: (NOTE) SARS-CoV-2 target nucleic acids are NOT DETECTED.  The SARS-CoV-2 RNA is generally detectable in upper respiratory specimens during the acute phase of infection. The lowest concentration of SARS-CoV-2 viral copies this assay can detect is 138 copies/mL. A negative result does not preclude SARS-Cov-2 infection and should not be used as the sole basis for treatment or other patient management decisions. A negative result may occur with  improper specimen collection/handling, submission of specimen other than nasopharyngeal swab, presence of viral mutation(s) within the areas targeted by this assay, and inadequate number of viral copies(<138 copies/mL). A negative result must be combined with clinical observations, patient history, and epidemiological information. The expected result is Negative.  Fact Sheet for Patients:  EntrepreneurPulse.com.au  Fact Sheet for Healthcare Providers:  IncredibleEmployment.be  This test is no t yet approved or cleared by the [place] FDA and  has been authorized for detection and/or diagnosis of SARS-CoV-2 by FDA under an Emergency Use  Authorization (EUA). This EUA will remain  in effect (meaning this test can be used) for the duration of the COVID-19 declaration under Section 564(b)(1) of the Act, 21 U.S.C.section 360bbb-3(b)(1), unless the authorization is terminated  or revoked sooner.       Influenza A by PCR NEGATIVE NEGATIVE Final   Influenza B by PCR NEGATIVE NEGATIVE Final    Comment: (NOTE) The Xpert Xpress SARS-CoV-2/FLU/RSV plus assay is intended as an aid in the diagnosis of influenza from Nasopharyngeal swab specimens and should not be used as a sole basis for treatment. Nasal washings and aspirates are unacceptable for Xpert Xpress SARS-CoV-2/FLU/RSV testing.  Fact Sheet for Patients: EntrepreneurPulse.com.au  Fact Sheet for Healthcare Providers: IncredibleEmployment.be  This test is not yet approved or cleared by the [place] FDA and has been authorized for detection and/or diagnosis of SARS-CoV-2 by FDA under an Emergency Use Authorization (EUA). This EUA will remain in effect (meaning this test can be used) for the duration of the COVID-19 declaration under Section 564(b)(1) of the Act, 21 U.S.C. section 360bbb-3(b)(1), unless the authorization is terminated or revoked.     Resp Syncytial Virus by PCR NEGATIVE NEGATIVE Final    Comment: (NOTE) Fact Sheet for Patients: EntrepreneurPulse.com.au  Fact Sheet for Healthcare Providers: IncredibleEmployment.be  This test is not yet approved or  cleared by the [place] and has been authorized for detection and/or diagnosis of SARS-CoV-2 by FDA under an Emergency Use Authorization (EUA). This EUA will remain in effect (meaning this test can be used) for the duration of the COVID-19 declaration under Section 564(b)(1) of the Act, 21 U.S.C. section 360bbb-3(b)(1), unless the authorization is terminated or revoked.  Performed at [facility], [address] [place], [place] [address]   MRSA Next Gen by PCR, Nasal     Status: Abnormal   Collection Time: 02/04/23 11:34 AM   Specimen: Nasal Mucosa; Nasal Swab  Result Value Ref Range Status   MRSA by PCR Next Gen DETECTED (A) NOT DETECTED Final    Comment: RESULT CALLED TO, READ BACK BY AND VERIFIED WITH: Carolin Coy [id number] 02/04/23 HNM (NOTE) The GeneXpert MRSA Assay (FDA approved for NASAL specimens only), is one component of a comprehensive MRSA colonization surveillance program. It is not intended to diagnose MRSA infection nor to guide or monitor treatment for MRSA infections. Test performance is not FDA approved in patients less than 51 years old. Performed at [facility], [address] [place], [place] [address]     Labs: CBC: No results for input(s): "WBC", "NEUTROABS", "HGB", "HCT", "MCV", "PLT" in the last 168 hours. Basic Metabolic Panel: No results for input(s): "NA", "K", "CL", "CO2", "GLUCOSE", "BUN", "CREATININE", "CALCIUM", "MG", "PHOS" in the last 168 hours. Liver Function Tests: No results for input(s): "AST", "ALT", "ALKPHOS", "BILITOT", "PROT", "ALBUMIN" in the last 168 hours. CBG: Recent Labs  Lab 02/12/23 1612 02/12/23 2036 02/13/23 0052 02/13/23 0519 02/13/23 0800  GLUCAP 102* 174* 140* 138* 146*    Discharge time spent: greater than 30 minutes.  Signed: Max Sane, MD [facility] 02/13/2023

## 2023-02-13 NOTE — Progress Notes (Signed)
Report called to CIR. Awaiting transport

## 2023-02-13 NOTE — TOC Transition Note (Signed)
Transition of Care [facility]) - CM/SW Discharge Note   Patient Details  Name: Amanda [place] MRN: [id number] Date of Birth: [birth year]  Transition of Care [facility]) CM/SW Contact:  Gerilyn Pilgrim, LCSW Phone Number: 02/13/2023, 10:37 AM   Clinical Narrative:   Pt has orders to discharge to CIR today. Medical necessity printed to unit. No additional needs at this time.           Patient Goals and CMS Choice      Discharge Placement                         Discharge Plan and Services Additional resources added to the After Visit Summary for                                       Social Determinants of Health (SDOH) Interventions SDOH Screenings   Food Insecurity: No Food Insecurity (12/23/2022)  Housing: Low Risk  (12/23/2022)  Transportation Needs: No Transportation Needs (12/23/2022)  Utilities: Not At Risk (10/20/2022)  Depression (PHQ2-9): High Risk (01/22/2023)  Financial Resource Strain: Low Risk  (10/20/2022)  Stress: No Stress Concern Present (10/20/2022)  Tobacco Use: Medium Risk (02/11/2023)     Readmission Risk Interventions     No data to display

## 2023-02-13 NOTE — Progress Notes (Addendum)
Patient ID: Amanda [place], female   DOB: Apr 02, 1946, 77 y.o.   MRN: [id number]  INPATIENT REHABILITATION ADMISSION NOTE   Arrival Method: [facility]     Mental Orientation: A&O X2, Person, Place   Assessment: See flowsheet   Skin: CDI   IV'S: N/A   Pain: Slight headache   Tubes and Drains: N/A   Safety Measures: Reviewed   Vital Signs: See flowsheet   Height and Weight: See flowsheet   Rehab Orientation: Completed   Family: Notified

## 2023-02-13 NOTE — Progress Notes (Signed)
Patient awaiting pick up from [facility] for transfer to CIR

## 2023-02-13 NOTE — Progress Notes (Signed)
Inpatient Rehabilitation Admission Medication Review by a Pharmacist  A complete drug regimen review was completed for this patient to identify any potential clinically significant medication issues.  High Risk Drug Classes Is patient taking? Indication by Medication  Antipsychotic No   Anticoagulant Yes Enoxaparin-VTE px  Antibiotic No   Opioid No   Antiplatelet Yes Aspirin-CVA  Hypoglycemics/insulin Yes Novolog insulin SSI and glipizide-DM  Vasoactive Medication Yes Amlodipine-HTN  Chemotherapy No   Other Yes Rosuvastatin-HLD Pantoprazole-GERD Memantine-dementia Melatonin-sleep Lactobacillus-gut health Folic acid-supplementation Ferrous sulfate-supplementation Buspirone-Anxiety Bupropion-MDD Vit C-Supplementation Arfomoterol/Umeclindinium-LCOPD Albuterol-COPD Miralax/Docusate-constipation     Type of Medication Issue Identified Description of Issue Recommendation(s)  Drug Interaction(s) (clinically significant)     Duplicate Therapy     Allergy     No Medication Administration End Date     Incorrect Dose     Additional Drug Therapy Needed     Significant med changes from prior encounter (inform family/care partners about these prior to discharge). Patient on clonazepam PTA Evaluate restart during CIR stay  Other       Clinically significant medication issues were identified that warrant physician communication and completion of prescribed/recommended actions by midnight of the next day:  No  Name of provider notified for urgent issues identified:   Provider Method of Notification:     Pharmacist comments:   Time spent performing this drug regimen review (minutes):  20   Dwayne A. Levada Dy, PharmD, BCPS, FNKF Clinical Pharmacist [place] Please utilize Amion for appropriate phone number to reach the unit pharmacist ([place])  02/13/2023 1:27 PM

## 2023-02-13 NOTE — Progress Notes (Signed)
Gertie Gowda, DO  Physician Physical Medicine and Rehabilitation   PMR Pre-admission    Signed   Date of Service: 02/07/2023 10:07 AM  Related encounter: ED to Hosp-Admission (Discharged) from 02/03/2023 in [place]      Show:Clear all [x] Written[x] Templated[x] Copied  Added by: [x] Cristina Gong, RN[x] Lind Covert, Runell Gess, CCC-SLP  [] Hover for details PMR Admission Coordinator Pre-Admission Assessment   Patient: Amanda [place] is an 77 y.o., female MRN: [id number] DOB: 11-21-1946 Height: 5\' 2"  (157.5 cm) Weight: 41.3 kg                                                                                                                                                  Insurance Information HMO:     PPO: yes     PCP:      IPA:      80/20:      OTHER:  PRIMARY: [employer] Medicare      Policy#: [id number]      Subscriber: patient CM Name: Dawson Bills      Phone#: [phone] Ext [phone]     Fax#: [phone] Pre-Cert#: [id number]  approved for 7 days 02/13/23 until 03/01/23 f/u with Edwena Felty phone [phone] fax [phone]    Employer:  Benefits:  Phone #: online at [email or web address]     Name:  [place]. Date: 12/02/19-still active     Deduct: does not have   Out of Pocket Max: $4,000      Life Max: NA  CIR: $160/day co-pay with a max co-pay of $1,600/admission      SNF: 100% coverage for days 1-20, $50/day co-pay for days 21-100 Outpatient: $20/visit co-pay     Co-Pay:  Home Health: 100% coverage      Co-Pay:  DME: 80% coverage     Co-Pay: 20% co-insurance Providers: in-network SECONDARY:       Policy#:       Phone#:    Development worker, community:       Phone#:    The Engineer, [employer]" for patients in Inpatient Rehabilitation Facilities with attached "Privacy Act [place] Records" was provided and verbally reviewed with: Family   Emergency Contact Information Contact Information        Name Relation Home Work Mobile    [place] Daughter [phone]        [name] [phone]   [phone]    [name] [phone]   [phone]         Current Medical History  Patient Admitting Diagnosis: SDH   History of Present Illness:  77 year old right-handed female with history of COPD with history of lung cancer status post upper left lobectomy, former smoker,, chronic anemia, anxiety/depression/dementia maintained on Namenda, CVA with left-sided deficits, diabetes mellitus type 2, hyperlipidemia, hypertension, tardive dyskinesia, recurrent UTIs, dementia,  right THA 09/07/2022 per Dr. Harlow Mares, right VP shunt replacement for NPH about 5 months ago per Dr. Meade Maw. Patient attends adult daycare.   Dating back to December patient was modified independent ambulate with a rollator with steady decline since January requiring assist for transfers ambulating only short distances and family also reports increased confusion.   Presented to [facility] 02/03/2023 with increasing confusion and gait disorder.     Cranial CT scan showed mixed density right cerebral convexity subdural hematoma significantly increased in size since 01/12/2023 currently measuring up to 2.4 cm with worsening mass effect and 1.3 cm rightward midline shift.  Stable position of right frontal ventricular catheter without evidence of hydrocephalus or trapped ventricle.  Patient underwent right frontal burr hole for drainage of subdural hematoma 02/03/2023 per Dr. [name] and VP shunt removed.  Latest cranial CT scan 02/09/2023 shows decreased size of right subdural collection now 6 mm decreased right to left midline shift now 4 mm.  Hospital course patient was cleared to resume low-dose aspirin.  Lovenox was added for DVT prophylaxis 02/05/2023.  Patient is tolerating a regular consistency diet.  Patient with chronic anemia with hemoglobin dating back to 12/26/2022 of 8.7 currently 8.0. Palliative care  consulted to establish goals of care.    Patient's medical record from [facility] has been reviewed by the rehabilitation admission coordinator and physician.   Past Medical History      Past Medical History:  Diagnosis Date   Acute respiratory failure requiring reintubation ([place]) 11/05/2021   Anemia     Anxiety     Anxiety and depression     COPD (chronic obstructive pulmonary disease) (HCC)     CVA (cerebral vascular accident) ([place])     CVA (cerebral vascular accident) ([place])      L sided deficits   Dementia ([place])     Depression     Diabetes mellitus ([place])      Type 2   Diabetes mellitus without complication ([place])     Dysphagia     Dysphasia     Falls     GERD (gastroesophageal reflux disease)     HCAP (healthcare-associated pneumonia) 12/16/2022   History of lung cancer     Hyperlipidemia     Hypertension     Influenza A 12/13/2021   Memory disturbance 11/20/2017   Normal pressure hydrocephalus ([place]) 2018   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    Has the patient had major surgery during 100 days prior to admission? Yes   Family History  family history includes Cancer in her brother, brother, and father; Diabetes in her sister and sister; Heart disease in her sister; Hyperlipidemia in her sister; Hypertension in her sister and sister; Pancreatic cancer in her sister; Pneumonia in her mother; Throat cancer in her brother.   Current Medications    Current Facility-Administered Medications:    acetaminophen (TYLENOL) tablet 650 mg, 650 mg, Oral, Q4H PRN, Rust-Chester, Britton L, NP, 650 mg at 02/12/23 0926   albuterol (PROVENTIL) (2.5 MG/3ML) 0.083% nebulizer solution 2.5 mg, 2.5 mg, Nebulization, Q4H PRN, Rust-Chester,  Britton L, NP   amLODipine (NORVASC) tablet 2.5 mg, 2.5 mg, Oral, Daily, Posey Pronto, Sona,  MD, 2.5 mg at 02/13/23 0742   arformoterol (BROVANA) nebulizer solution 15 mcg, 15 mcg, Nebulization, BID, 15 mcg at 02/13/23 0729 **AND** umeclidinium bromide (INCRUSE ELLIPTA) 62.5 MCG/ACT 1 puff, 1 puff, Inhalation, Daily, Rust-Chester, Britton L, NP, 1 puff at 02/13/23 [id number]   ascorbic acid (VITAMIN C) tablet 500 mg, 500 mg, Oral, Daily, [name], MD, 500 mg at 02/13/23 [id number]   aspirin chewable tablet 81 mg, 81 mg, Oral, Daily, [name], MD, 81 mg at 02/13/23 [id number]   buPROPion (WELLBUTRIN XL) 24 hr tablet 300 mg, 300 mg, Oral, Daily, [name], MD, 300 mg at 02/13/23 0825   busPIRone (BUSPAR) tablet 30 mg, 30 mg, Oral, BID, [name], MD, 30 mg at 02/13/23 [id number]   Chlorhexidine Gluconate Cloth 2 % PADS 6 each, 6 each, Topical, Daily, [name], MD, 6 each at 02/13/23 0933   docusate sodium (COLACE) capsule 100 mg, 100 mg, Oral, BID PRN, Rust-Chester, Britton L, NP, 100 mg at 02/13/23 0742   enoxaparin (LOVENOX) injection 30 mg, 30 mg, Subcutaneous, Q24H, [name], MD, 30 mg at 02/12/23 2147   feeding supplement (ENSURE ENLIVE / ENSURE PLUS) liquid 237 mL, 237 mL, Oral, BID BM, [name], MD, 237 mL at 02/13/23 0933   ferrous sulfate tablet 325 mg, 325 mg, Oral, Q breakfast, [name], MD, 325 mg at [id number]   folic acid (FOLVITE) tablet 1 mg, 1 mg, Oral, Daily, [name], MD, 1 mg at 02/13/23 [id number]   glipiZIDE (GLUCOTROL XL) 24 hr tablet 2.5 mg, 2.5 mg, Oral, Q breakfast, [name], MD, 2.5 mg at 02/13/23 0741   hydrocortisone cream 0.5 %, , Topical, BID, Wynelle Cleveland, [facility], Given at 02/13/23 0743   insulin aspart (novoLOG) injection 0-5 Units, 0-5 Units, Subcutaneous, QHS, Patel, Sona, MD   insulin aspart (novoLOG) injection 0-9 Units, 0-9 Units, Subcutaneous, TID WC, [name], MD, 1 Units at 02/13/23 0825   labetalol (NORMODYNE) injection 10 mg, 10 mg, Intravenous, Q2H PRN,  Rust-Chester, Toribio Harbour L, NP   lactobacillus (FLORANEX/LACTINEX) granules 1 g, 1 g, Oral, Daily, [name], MD, 1 g at 02/13/23 0743   melatonin tablet 10 mg, 10 mg, Oral, QHS, Rust-Chester, Britton L, NP, 10 mg at 02/12/23 2147   memantine (NAMENDA) tablet 5 mg, 5 mg, Oral, BID, Rust-Chester, Britton L, NP, 5 mg at 02/13/23 [id number]   multivitamin with minerals tablet 1 tablet, 1 tablet, Oral, Daily, [name], MD, 1 tablet at 02/13/23 0742   mupirocin ointment (BACTROBAN) 2 %, , Nasal, BID, Flora Lipps, MD, Given at 02/13/23 0743   pantoprazole (PROTONIX) EC tablet 40 mg, 40 mg, Oral, QHS, Benita Gutter, RPH, 40 mg at 02/12/23 2147   polyethylene glycol (MIRALAX / GLYCOLAX) packet 17 g, 17 g, Oral, Daily PRN, Rust-Chester, Toribio Harbour L, NP   rosuvastatin (CRESTOR) tablet 20 mg, 20 mg, Oral, QPM, Rust-Chester, Britton L, NP, 20 mg at 02/12/23 1743   Patients Current Diet:  Diet Order                  Diet - low sodium heart healthy             Diet Carb Modified Fluid consistency: Thin; Room service appropriate? Yes  Diet effective now                       Precautions / Restrictions Precautions Precautions: Fall Precaution Comments: s/p R frontal burr hole; monitor BP Restrictions Weight Bearing Restrictions:  No    Has the patient had 2 or more falls or a fall with injury in the past year?Yes   Prior Activity Level Limited Community (1-2x/wk): goes to adult day care 3days/week   Prior Functional Level Prior Function Prior Level of Function : Needs assist Mobility Comments: In December pt was modified independent ambulating with rollator but has had decline in status since January requiring assist for transfers and walking 10-20 feet (and 40-50 feet when going to adult day care).   Self Care: Did the patient need help bathing, dressing, using the toilet or eating?  Needed some help   Indoor Mobility: Did the patient need assistance with walking from room to room (with or  without device)? Independent   Stairs: Did the patient need assistance with internal or external stairs (with or without device)? Needed some help   Functional Cognition: Did the patient need help planning regular tasks such as shopping or remembering to take medications? Needed some help   Patient Information Are you of Hispanic, Latino/a,or Spanish origin?: X. Patient unable to respond, A. No, not of Hispanic, Latino/a, or Spanish origin What is your race?: X. Patient unable to respond, B. Black or African American Do you need or want an interpreter to communicate with a doctor or health care staff?: 9. Unable to respond   Patient's Response To:  Health Literacy and Transportation Is the patient able to respond to health literacy and transportation needs?: No Health Literacy - How often do you need to have someone help you when you read instructions, pamphlets, or other written material from your doctor or pharmacy?:unable to respond In the past 12 months, has lack of transportation kept you from medical appointments or from getting medications?:  (daughter reports pt has not missed any medical appointments due to transportation) In the past 12 months, has lack of transportation kept you from meetings, work, or from getting things needed for daily living?:  (daughter reports pt has not missed any non-medical appointments d/t transportation)   Development worker, international aid / Equipment Home Equipment: Rollator (4 wheels), Shower seat, Grab bars - tub/shower, Toilet riser, Wheelchair - manual   Prior Device Use: Indicate devices/aids used by the patient prior to current illness, exacerbation or injury? Walker   Current Functional Level Cognition   Overall Cognitive Status: Impaired/Different from baseline Current Attention Level: Focused Orientation Level: Oriented to person, Oriented to place, Oriented to situation, Disoriented to time Following Commands: Follows one step commands  inconsistently Safety/Judgement: Decreased awareness of safety, Decreased awareness of deficits General Comments: cues to initiate mobility, improved with increased alertness with activity.    Extremity Assessment (includes Sensation/Coordination)   Upper Extremity Assessment: Difficult to assess due to impaired cognition, RUE deficits/detail RUE Deficits / Details: H/o L frozen shoulder with decreased ROM baseline.  Lower Extremity Assessment: Difficult to assess due to impaired cognition     ADLs   Overall ADL's : Needs assistance/impaired Grooming: Wash/dry face, Sitting, Supervision/safety Grooming Details (indicate cue type and reason): intermittent vcs for thoroughness General ADL Comments: MIN A for simulated BSC t/f, verbal and tactile cues to sequence steps along EOB. MIN A tooth brushing seated EOB, initial fair righting ractions however as pt fatigues requires MIN A sitting balance (R lateral lean).     Mobility   Overal bed mobility: Needs Assistance Bed Mobility: Supine to Sit, Sit to Supine Supine to sit: Mod assist Sit to supine: Min guard General bed mobility comments: NT in recliner pre/post session  Transfers   Overall transfer level: Needs assistance Equipment used: 1 person hand held assist, Rolling walker (2 wheels) Transfers: Sit to/from Stand (5x with RW with MIN-MOD A; 5x with MIN A; pt able to sustain  static standing for no more than 20 seconds at attempts) Sit to Stand: Min assist General transfer comment: x5 sit<>stand with cues + MIN a to initiate lift off     Ambulation / Gait / Stairs / Wheelchair Mobility   Ambulation/Gait Ambulation/Gait assistance: Min guard, Min assist Gait Distance (Feet):  (55) Assistive device: Rolling walker (2 wheels) Gait Pattern/deviations: Step-to pattern, Decreased step length - right, Decreased step length - left (trunk and upper body rotated to right during gait) General Gait Details: Pt required assist to stay on  task and keep RW moving. Gait velocity: decreased     Posture / Balance Dynamic Sitting Balance Sitting balance - Comments: steady sitting reaching outside BOS with slow R UE reaching movments Balance Overall balance assessment: Needs assistance Sitting-balance support: Single extremity supported, Feet supported Sitting balance-Leahy Scale: Fair Sitting balance - Comments: steady sitting reaching outside BOS with slow R UE reaching movments Postural control: Right lateral lean Standing balance support: Bilateral upper extremity supported, During functional activity Standing balance-Leahy Scale: Fair Standing balance comment: min gaurd/assist today with most tasks.  constant verbal and tactile cues needed.     Special needs/care consideration Palliative Care involved DNR on acute hospital    Previous Home Environment  Living Arrangements: Children, Other (Comment) (granddaughter)  Lives With: Daughter, Other (Comment) (granddaughter) Available Help at Discharge: Family, Available PRN/intermittently (daughter arranging 24/7 support) Type of Home: House Home Layout: Able to live on main level with bedroom/bathroom Home Access: Stairs to enter Entrance Stairs-Rails: None Entrance Stairs-Number of Steps: 1 Bathroom Shower/Tub: Chiropodist: Handicapped height Bathroom Accessibility: Yes How Accessible: Accessible via walker [place]: Yes Type of Home Care Services: Home OT, Home PT, Home RN, Other (Comment) (Home ST) [place] (if known): [facility] Additional Comments: lives with daughter (works full time) and grand child (autistic)   Discharge Living Setting Plans for Discharge Living Setting: Patient's home Type of Home at Discharge: House Discharge Home Layout: Able to live on main level with bedroom/bathroom Discharge Home Access: Stairs to enter Entrance Stairs-Rails: None Entrance Stairs-Number of Steps: 1 Discharge Bathroom Shower/Tub:  Tub/shower unit Discharge Bathroom Toilet: Handicapped height Discharge Bathroom Accessibility: Yes How Accessible: Accessible via walker Does the patient have any problems obtaining your medications?: No   Social/Family/Support Systems Anticipated Caregiver: Mohogany Sonday, daughter Anticipated Caregiver's Contact Information: [phone] Caregiver Availability: Intermittent (daughter arranging 24/7 support) Discharge Plan Discussed with Primary Caregiver: Yes Is Caregiver In Agreement with Plan?: Yes Does Caregiver/Family have Issues with Lodging/Transportation while Pt is in Rehab?: No   Goals Patient/Family Goal for Rehab: Supervision: PT/OT/ST Expected length of stay: 10-14 days Pt/Family Agrees to Admission and willing to participate: Yes Program Orientation Provided & Reviewed with Pt/Caregiver Including Roles  & Responsibilities: Yes   Decrease burden of Care through IP rehab admission: NA   Possible need for SNF placement upon discharge:Not anticipated   Patient Condition: This patient's medical and functional status has changed since the consult dated: 02/06/23 in which the Rehabilitation Physician determined and documented that the patient's condition is appropriate for intensive rehabilitative care in an inpatient rehabilitation facility. See "History of Present Illness" (above) for medical update. Functional changes are: overall min assist. Patient's medical and functional status update has been discussed with  the Rehabilitation physician and patient remains appropriate for inpatient rehabilitation. Will admit to inpatient rehab today.   Preadmission Screen Completed By:  Bethel Born, CCC-SLP, 02/13/2023 10:23 AM ______________________________________________________________________   Discussed status with Dr. Tressa Busman on 02/13/2023 at 109 and received approval for admission today.   Admission Coordinator:  Bethel Born, time 1022 Date 02/13/2023             Revision History

## 2023-02-13 NOTE — H&P (Signed)
Expand All Collapse All      Physical Medicine and Rehabilitation Admission H&P        Chief Complaint  Patient presents with   Altered Mental Status  : HPI: Amanda Porter is a 77 year old right-handed female with history of COPD with history of lung cancer status post upper left lobectomy, former smoker,, chronic anemia, anxiety/depression/dementia maintained on Namenda, CVA with left-sided deficits, diabetes mellitus type 2, hyperlipidemia, hypertension, tardive dyskinesia, recurrent UTIs, dementia, right THA 09/07/2022 per Dr. Harlow Mares, right VP shunt replacement for NPH about 5 months ago per Dr. Meade Maw.  Per chart review patient currently lives with her daughter and autistic grandchild.  Patient attends adult daycare.  Two-level home bed and bath on main level.  Dating back to December patient was modified independent ambulate with a rollator with steady decline since January requiring assist for transfers ambulating only short distances and family also reports increased confusion.  Family planning assist as needed on discharge.  Presented to [facility]/03/2023 with increasing confusion and gait disorder.  Cranial CT scan showed mixed density right cerebral convexity subdural hematoma significantly increased in size since 01/12/2023 currently measuring up to 2.4 cm with worsening mass effect and 1.3 cm rightward midline shift.  Stable position of right frontal ventricular catheter without evidence of hydrocephalus or trapped ventricle.  Patient underwent right frontal burr hole for drainage of subdural hematoma 02/03/2023 per Dr. [name] and VP shunt removed.  Latest cranial CT scan 02/09/2023 shows decreased size of right subdural collection now 6 mm decreased right to left midline shift now 4 mm.  Hospital course patient was cleared to resume low-dose aspirin.  Lovenox was added for DVT prophylaxis 02/05/2023.  Patient is tolerating a regular consistency diet.  Patient with chronic anemia with  hemoglobin dating back to 12/26/2022 of 8.7 currently 8.0. Palliative care consulted to establish goals of care. Therapy evaluations completed due to patient's decreased functional mobility altered mental status was admitted for a comprehensive rehab program.   Review of Systems  Constitutional:  Positive for malaise/fatigue. Negative for chills and fever.  HENT:  Negative for hearing loss.   Eyes:  Positive for blurred vision. Negative for double vision.  Respiratory:  Positive for shortness of breath. Negative for cough and wheezing.   Cardiovascular:  Negative for chest pain, palpitations and leg swelling.  Gastrointestinal:  Positive for constipation. Negative for heartburn, nausea and vomiting.       GERD  Genitourinary:  Positive for urgency. Negative for dysuria.  Musculoskeletal:  Positive for joint pain and myalgias.       History of falls  Skin:  Negative for rash.  Neurological:  Positive for weakness.       Tardive dyskinesia  Psychiatric/Behavioral:  Positive for depression and memory loss.        Anxiety  All other systems reviewed and are negative.       Past Medical History:  Diagnosis Date   Acute respiratory failure requiring reintubation ([place]) 11/05/2021   Anemia     Anxiety     Anxiety and depression     COPD (chronic obstructive pulmonary disease) ([facility])     CVA (cerebral vascular accident) ([place])     CVA (cerebral vascular accident) ([place])      L sided deficits   Dementia ([place])     Depression     Diabetes mellitus ([place])      Type 2   Diabetes mellitus without complication ([place])     Dysphagia  Dysphasia     Falls     GERD (gastroesophageal reflux disease)     HCAP (healthcare-associated pneumonia) 12/16/2022   History of lung cancer     Hyperlipidemia     Hypertension     Influenza A 12/13/2021   Memory disturbance 11/20/2017   Normal pressure hydrocephalus ([place]) 2018   Nose colonized with MRSA 08/25/2022    a.) noted on pre-surgical swab prior to  VP shunt revision   Retinal detachment      Right   Right carotid bruit 11/20/2017   Tardive dyskinesia 02/01/2020   Tardive dyskinesia     Thrombosis      Arterial to lower extremity?         Past Surgical History:  Procedure Laterality Date   BURR HOLE Right 02/03/2023    Procedure: BURR HOLE X 1, ventriculoperitoneal shunt removal;  Surgeon: Meade Maw, MD;  Location: [facility] ORS;  Service: Neurosurgery;  Laterality: Right;   ESOPHAGOGASTRODUODENOSCOPY (EGD) WITH PROPOFOL N/A 12/16/2022    Procedure: ESOPHAGOGASTRODUODENOSCOPY (EGD) WITH PROPOFOL;  Surgeon: Lucilla Lame, MD;  Location: [facility];  Service: Endoscopy;  Laterality: N/A;   HIP ARTHROPLASTY Right 09/07/2022    Procedure: ARTHROPLASTY BIPOLAR HIP (HEMIARTHROPLASTY);  Surgeon: Lovell Sheehan, MD;  Location: [facility] OR[facility];  Service: Orthopedics;  Laterality: Right;   IR FL GUIDED LOC OF NEEDLE/CATH TIP FOR SPINAL INJECTION RT   07/25/2022   LUNG REMOVAL, PARTIAL        left upper lobe   SHUNT REVISION VENTRICULAR-PERITONEAL N/A 09/19/2022    Procedure: VENTRICULOPERITONEAL SHUNT REVISION;  Surgeon: Meade Maw, MD;  Location: [facility] OR[facility];  Service: Neurosurgery;  Laterality: N/A;   VENTRICULOPERITONEAL SHUNT   12/09/2016   VENTRICULOPERITONEAL SHUNT   10/2018         Family History  Problem Relation Age of Onset   Pneumonia Mother     Cancer Father     Hyperlipidemia Sister     Hypertension Sister     Heart disease Sister     Diabetes Sister     Hypertension Sister     Diabetes Sister     Pancreatic cancer Sister     Throat cancer Brother     Cancer Brother     Cancer Brother     Breast cancer Neg Hx      Social History:  reports that she quit smoking about 5 years ago. Her smoking use included cigarettes. She has a 25.00 pack-year smoking history. She has been exposed to tobacco smoke. She has never used smokeless tobacco. She reports that she does not currently use alcohol. She reports that she does not  currently use drugs. Allergies:       Allergies  Allergen Reactions   Lexapro [Escitalopram Oxalate] Other (See Comments)      Dizziness, nausea, fatigue   Neosporin  [Neomycin-Polymyxin-Gramicidin] Rash      (Neosporin)   Neosporin [Bacitracin-Polymyxin B] Rash          Medications Prior to Admission  Medication Sig Dispense Refill   acetaminophen (TYLENOL) 325 MG tablet Take 650 mg by mouth every 4 (four) hours as needed for mild pain.       albuterol (VENTOLIN HFA) 108 (90 Base) MCG/ACT inhaler INHALE 2 PUFFS BY MOUTH EVERY 4 HOURS AS NEEDED 18 g 12   Albuterol Sulfate 2.5 MG/0.5ML NEBU Inhale 2.5 mg into the lungs every 6 (six) hours as needed (shortness of breath).       ascorbic acid (VITAMIN C)  500 MG tablet Take 1 tablet (500 mg total) by mouth daily. 30 tablet 2   aspirin 81 MG chewable tablet Chew 1 tablet (81 mg total) by mouth daily. Hold for now and resume on 12/22/22       buPROPion (WELLBUTRIN XL) 300 MG 24 hr tablet Take 1 tablet (300 mg total) by mouth daily with breakfast. for anxiety and depression. 90 tablet 0   busPIRone (BUSPAR) 30 MG tablet Take 30 mg by mouth 2 (two) times daily.       Calcium Carb-Cholecalciferol (CALCIUM/VITAMIN D PO) Take 1 tablet by mouth daily.       clonazePAM (KLONOPIN) 1 MG tablet Take 1 mg by mouth at bedtime.       conjugated estrogens (PREMARIN) vaginal cream Apply one pea-sized amount around the opening of the urethra daily for 2 weeks, then 3 times weekly moving forward. 30 g 4   ferrous sulfate 325 (65 FE) MG tablet Take 1 tablet (325 mg total) by mouth daily with breakfast. 30 tablet 2   folic acid (FOLVITE) 1 MG tablet Take 1 mg by mouth daily.       glipiZIDE (GLUCOTROL XL) 2.5 MG 24 hr tablet TAKE 1 TABLET (2.5 MG TOTAL) BY MOUTH DAILY WITH BREAKFAST. FOR DIABETES. 90 tablet 0   LACTOBACILLUS PROBIOTIC PO Take 1 capsule by mouth daily.       Melatonin 10 MG TABS Take 10 mg by mouth at bedtime.       memantine (NAMENDA) 5 MG tablet  Take 1 tablet (5 mg total) by mouth 2 (two) times daily. For memory. 180 tablet 3   Menthol, Topical Analgesic, (BIOFREEZE) 4 % GEL Apply topically in the morning and at bedtime.       pantoprazole (PROTONIX) 40 MG tablet Take 1 tablet (40 mg total) by mouth daily. 30 tablet 2   rosuvastatin (CRESTOR) 20 MG tablet TAKE 1 TABLET (20 MG TOTAL) BY MOUTH EVERY EVENING. FOR CHOLESTEROL. 90 tablet 3   Tiotropium Bromide-Olodaterol (STIOLTO RESPIMAT) 2.5-2.5 MCG/ACT AERS Inhale 2 each into the lungs in the morning.       Continuous Blood Gluc Sensor (FREESTYLE LIBRE 3 SENSOR) MISC Place 1 sensor on the skin every 14 days. Use to check glucose continuously 6 each 1   PROLIA 60 MG/ML SOSY injection Inject 60 mg into the skin every 6 (six) months.              Home: Home Living Family/patient expects to be discharged to:: Private residence Living Arrangements: Children, Other (Comment) (granddaughter) Available Help at Discharge: Family, Available PRN/intermittently (daughter arranging 24/7 support) Type of Home: House Home Access: Stairs to enter Technical brewer of Steps: 1 Entrance Stairs-Rails: None Home Layout: Able to live on main level with bedroom/bathroom Bathroom Shower/Tub: Chiropodist: Handicapped height Bathroom Accessibility: Yes Home Equipment: Rollator (4 wheels), Shower seat, Grab bars - tub/shower, Toilet riser, Wheelchair - manual Additional Comments: lives with daughter (works full time) and grand child (autistic)  Lives With: Daughter, Other (Comment) (granddaughter)   Functional History: Prior Function Prior Level of Function : Needs assist Mobility Comments: In December pt was modified independent ambulating with rollator but has had decline in status since January requiring assist for transfers and walking 10-20 feet (and 40-50 feet when going to adult day care).   Functional Status:  Mobility: Bed Mobility Overal bed mobility: Needs  Assistance Bed Mobility: Supine to Sit, Sit to Supine Supine to sit: Mod assist Sit to  supine: Min guard General bed mobility comments: NT in recliner pre/post session Transfers Overall transfer level: Needs assistance Equipment used: 1 person hand held assist, Rolling walker (2 wheels) Transfers: Sit to/from Stand (5x with RW with MIN-MOD A; 5x with MIN A; pt able to sustain  static standing for no more than 20 seconds at attempts) Sit to Stand: Min assist General transfer comment: x5 sit<>stand with cues + MIN a to initiate lift off Ambulation/Gait Ambulation/Gait assistance: Min guard, Min assist Gait Distance (Feet):  (55) Assistive device: Rolling walker (2 wheels) Gait Pattern/deviations: Step-to pattern, Decreased step length - right, Decreased step length - left (trunk and upper body rotated to right during gait) General Gait Details: Pt required assist to stay on task and keep RW moving. Gait velocity: decreased   ADL: ADL Overall ADL's : Needs assistance/impaired Grooming: Wash/dry face, Sitting, Supervision/safety Grooming Details (indicate cue type and reason): intermittent vcs for thoroughness General ADL Comments: MIN A for simulated BSC t/f, verbal and tactile cues to sequence steps along EOB. MIN A tooth brushing seated EOB, initial fair righting ractions however as pt fatigues requires MIN A sitting balance (R lateral lean).   Cognition: Cognition Overall Cognitive Status: Impaired/Different from baseline Orientation Level: Disoriented to situation, Disoriented to time Cognition Arousal/Alertness: Awake/alert Behavior During Therapy: Flat affect Overall Cognitive Status: Impaired/Different from baseline Area of Impairment: Memory, Following commands, Orientation, Attention, Awareness, Problem solving Orientation Level: Disoriented to, Time, Situation Current Attention Level: Focused Memory: Decreased recall of precautions, Decreased short-term memory Following  Commands: Follows one step commands inconsistently Safety/Judgement: Decreased awareness of safety, Decreased awareness of deficits Problem Solving: Slow processing, Decreased initiation, Difficulty sequencing, Requires tactile cues, Requires verbal cues General Comments: cues to initiate mobility, improved with increased alertness with activity.   Physical Exam: Blood pressure (!) 137/52, pulse 91, temperature 98.8 F (37.1 C), temperature source Oral, resp. rate 18, height 5\' 2"  (1.575 m), weight 44.3 kg, SpO2 91 %. Physical Exam:  Constitutional: No apparent distress. Appropriate appearance for age.  HENT: No JVD. Neck Supple. Trachea midline.  Eyes: PERRLA. EOMI. + mild L exotropia Cardiovascular: RRR, no murmurs/rub/gallops. No Edema. Peripheral pulses 2+  Respiratory: Mild L basilar crackles.  On RA.  Abdomen: + bowel sounds, normoactive. No distention or tenderness.  GU: Not examined.   Skin: C/D/I. + erythema and excoriations on L posterior scalp. Hemicrani site c/d/I, well healed.  MSK:      No apparent deformity.      Strength: 4/5 throughout LUE and LLE 5/5 throughout RUE and RLE  Neurologic exam:  Cognition: AAO to person; place with cues; not time - repeats "Friday" for month and year Language: Fluent, No substitutions or neoglisms. No dysarthria. Names 3/3 objects correctly  Memory: +deficits, moderate Insight: Poor insight into current condition.  Mood: Flat affect, appropriate mood.  Sensation: To light touch intact in BL UEs and LEs  Reflexes: 1+ in BL UE and LEs. Negative Hoffman's and babinski signs bilaterally.  CN:+ L facial droop Coordination: + ataxia on FTN L>R  Spasticity: MAS 2 L elbow, shoulder; MAS 4 vs contracture L PF        Lab Results Last 48 Hours        Results for orders placed or performed during the hospital encounter of 02/03/23 (from the past 48 hour(s))  Glucose, capillary     Status: Abnormal    Collection Time: 02/11/23  8:03 AM   Result Value Ref Range    Glucose-Capillary 170 (  H) 70 - 99 mg/dL      Comment: Glucose reference range applies only to samples taken after fasting for at least 8 hours.  Glucose, capillary     Status: Abnormal    Collection Time: 02/11/23 12:24 PM  Result Value Ref Range    Glucose-Capillary 179 (H) 70 - 99 mg/dL      Comment: Glucose reference range applies only to samples taken after fasting for at least 8 hours.  Glucose, capillary     Status: Abnormal    Collection Time: 02/11/23  4:04 PM  Result Value Ref Range    Glucose-Capillary 163 (H) 70 - 99 mg/dL      Comment: Glucose reference range applies only to samples taken after fasting for at least 8 hours.  Glucose, capillary     Status: Abnormal    Collection Time: 02/11/23  9:29 PM  Result Value Ref Range    Glucose-Capillary 108 (H) 70 - 99 mg/dL      Comment: Glucose reference range applies only to samples taken after fasting for at least 8 hours.  Glucose, capillary     Status: Abnormal    Collection Time: 02/12/23 12:58 AM  Result Value Ref Range    Glucose-Capillary 145 (H) 70 - 99 mg/dL      Comment: Glucose reference range applies only to samples taken after fasting for at least 8 hours.  Glucose, capillary     Status: Abnormal    Collection Time: 02/12/23  4:13 AM  Result Value Ref Range    Glucose-Capillary 113 (H) 70 - 99 mg/dL      Comment: Glucose reference range applies only to samples taken after fasting for at least 8 hours.  Glucose, capillary     Status: Abnormal    Collection Time: 02/12/23  8:00 AM  Result Value Ref Range    Glucose-Capillary 121 (H) 70 - 99 mg/dL      Comment: Glucose reference range applies only to samples taken after fasting for at least 8 hours.  Glucose, capillary     Status: Abnormal    Collection Time: 02/12/23 11:50 AM  Result Value Ref Range    Glucose-Capillary 171 (H) 70 - 99 mg/dL      Comment: Glucose reference range applies only to samples taken after fasting for at least  8 hours.  Glucose, capillary     Status: Abnormal    Collection Time: 02/12/23  4:12 PM  Result Value Ref Range    Glucose-Capillary 102 (H) 70 - 99 mg/dL      Comment: Glucose reference range applies only to samples taken after fasting for at least 8 hours.  Glucose, capillary     Status: Abnormal    Collection Time: 02/12/23  8:36 PM  Result Value Ref Range    Glucose-Capillary 174 (H) 70 - 99 mg/dL      Comment: Glucose reference range applies only to samples taken after fasting for at least 8 hours.  Glucose, capillary     Status: Abnormal    Collection Time: 02/13/23 12:52 AM  Result Value Ref Range    Glucose-Capillary 140 (H) 70 - 99 mg/dL      Comment: Glucose reference range applies only to samples taken after fasting for at least 8 hours.  Glucose, capillary     Status: Abnormal    Collection Time: 02/13/23  5:19 AM  Result Value Ref Range    Glucose-Capillary 138 (H) 70 - 99 mg/dL  Comment: Glucose reference range applies only to samples taken after fasting for at least 8 hours.      Imaging Results (Last 48 hours)  No results found.         Blood pressure (!) 137/52, pulse 91, temperature 98.8 F (37.1 C), temperature source Oral, resp. rate 18, height 5\' 2"  (1.575 m), weight 44.3 kg, SpO2 91 %.   Medical Problem List and Plan: 1. Functional deficits secondary to worsening subdural hematoma status post right frontal burr hole 02/03/2023 and VP shunt removed with  history of recent right VP shunt replacement for NPH approximately 5 months ago followed by neurosurgery Dr. Meade Maw as well as history of CVA with left-sided residual weakness.             -patient may shower             -ELOS/Goals: 10-14 days, SPV PT/OT/SLP 2.  Antithrombotics: -DVT/anticoagulation:  Pharmaceutical: Lovenox initiated 02/05/2023             -antiplatelet therapy: Aspirin 81 mg daily 3. Pain Management: Tylenol as needed 4. Mood/Behavior/Sleep/anxiety/depression: Wellbutrin 300  mg daily, BuSpar 30 mg twice daily, Namenda 5 mg twice daily, melatonin 10 mg nightly             -antipsychotic agents: N/A 5. Neuropsych/cognition: This patient is not capable of making decisions on her own behalf. 6. Skin/Wound Care: Routine skin checks 7. Fluids/Electrolytes/Nutrition: Routine in and outs with follow-up chemistries 8.  Chronic COPD without exacerbation as well as history of lung cancer status post right upper lobe lobectomy, former smoker.  Supplemental oxygen as needed.  Continue inhalers as directed 9.  Hyperlipidemia.  Crestor 10.  Hypertension.  Currently on Norvasc 2.5 mg daily.  Blood pressure has been soft losartan currently on hold. 11.  Diabetes mellitus.  Hemoglobin A1c 6.8.  SSI/Glucotrol 2.5 mg daily Recent Labs    02/13/23 0800 02/13/23 1204 02/13/23 1626  GLUCAP 146* 170* 139*     12.  Chronic anemia.  Continue iron supplement.  Follow-up CBC 13.  Recurrent UTIs.  Monitor for any dysuria or hematuria 14.  Right THA 09/07/2022.  Weightbearing as tolerated 15.  Nose colonized with MRSA.  Bactroban as indicated 16.  GERD.  Protonix   Lavon Paganini Angiulli, PA-C 02/13/2023  I have examined the patient independently and edited the note for HPI, ROS, exam, assessment, and plan as appropriate. I am in agreement with the above recommendations.   Gertie Gowda, DO 02/13/2023

## 2023-02-13 NOTE — Progress Notes (Signed)
Amanda Ribas, MD  Physician Physical Medicine and Rehabilitation   Consult Note    Signed   Date of Service: 02/06/2023  4:04 PM  Related encounter: ED to Hosp-Admission (Discharged) from 02/03/2023 in [place]   Signed     Expand All Collapse All  Show:Clear all [x] Written[x] Templated[] Copied  Added by: [x] Raulkar, Clide Deutscher, MD  [] Hover for details          Physical Medicine and Rehabilitation Consult Reason for Consult: Assess for candidacy for CIR Referring Physician: [name], MD     HPI: Amanda Porter is a 77 y.o. female  who presented to the hospital with increased AMS and impaired coordination in her left arm. She has a history of hydrocephalus and dementia and baseline. Has had recent treatment for UTIs. Has had a prior brain bleed secondary to falls. Imaging showed a mixed density right cerebral convexity SDH significantly increased in size since 01/12/23 with worsened mass effect with 1.3 cm rightward midline shift. Patient is s/p right frontal burr hole for drainage of the SDH and removal of the VP shunt on 02/03/23. Drain was removed on 02/04/23. PMH includes R VP shunt for NPH about 5 months prior, anemia, anxiety, COPD, CVA, dementia, DM, right retinal detachment, tardive dyskinesia, and R THA. Physical Medicine & Rehabilitation was consulted to assess candidacy for CIR.       ROS + right frontal discomfort from insicion     Past Medical History:  Diagnosis Date   Acute respiratory failure requiring reintubation ([place]) 11/05/2021   Anemia     Anxiety     Anxiety and depression     COPD (chronic obstructive pulmonary disease) ([facility])     CVA (cerebral vascular accident) ([place])     CVA (cerebral vascular accident) ([place])      L sided deficits   Dementia ([place])     Depression     Diabetes mellitus ([place])      Type 2   Diabetes mellitus without complication ([place])     Dysphagia     Dysphasia     Falls     GERD  (gastroesophageal reflux disease)     HCAP (healthcare-associated pneumonia) 12/16/2022   History of lung cancer     Hyperlipidemia     Hypertension     Influenza A 12/13/2021   Memory disturbance 11/20/2017   Normal pressure hydrocephalus ([place]) 2018   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         Past Surgical History:  Procedure Laterality Date   BURR HOLE Right 02/03/2023    Procedure: BURR HOLE X 1, ventriculoperitoneal shunt removal;  Surgeon: [place] Maw, MD;  Location: [facility];  Service: Neurosurgery;  Laterality: Right;   ESOPHAGOGASTRODUODENOSCOPY (EGD) WITH PROPOFOL N/A 12/16/2022    Procedure: ESOPHAGOGASTRODUODENOSCOPY (EGD) WITH PROPOFOL;  Surgeon: Lucilla Lame, MD;  Location: [facility];  Service: Endoscopy;  Laterality: N/A;   HIP ARTHROPLASTY Right 09/07/2022    Procedure: ARTHROPLASTY BIPOLAR HIP (HEMIARTHROPLASTY);  Surgeon: Lovell Sheehan, MD;  Location: [facility];  Service: Orthopedics;  Laterality: Right;   IR FL GUIDED LOC OF NEEDLE/CATH TIP FOR SPINAL INJECTION RT  07/25/2022   LUNG REMOVAL, PARTIAL        left upper lobe   SHUNT REVISION VENTRICULAR-PERITONEAL N/A 09/19/2022    Procedure: VENTRICULOPERITONEAL SHUNT REVISION;  Surgeon: [place] Maw, MD;  Location: [facility];  Service: Neurosurgery;  Laterality: N/A;   VENTRICULOPERITONEAL SHUNT   12/09/2016   VENTRICULOPERITONEAL SHUNT   10/2018         Family History  Problem Relation Age of Onset   Pneumonia Mother     Cancer Father     Hyperlipidemia Sister     Hypertension Sister     Heart disease Sister     Diabetes Sister     Hypertension Sister     Diabetes Sister     Pancreatic cancer Sister     Throat cancer Brother     Cancer Brother     Cancer  Brother     Breast cancer Neg Hx      Social History:  reports that she quit smoking about 5 years ago. Her smoking use included cigarettes. She has a 25.00 pack-year smoking history. She has been exposed to tobacco smoke. She has never used smokeless tobacco. She reports that she does not currently use alcohol. She reports that she does not currently use drugs. Allergies:       Allergies  Allergen Reactions   Lexapro [Escitalopram Oxalate] Other (See Comments)      Dizziness, nausea, fatigue   Neosporin  [Neomycin-Polymyxin-Gramicidin] Rash      (Neosporin)   Neosporin [Bacitracin-Polymyxin B] Rash          Medications Prior to Admission  Medication Sig Dispense Refill   acetaminophen (TYLENOL) 325 MG tablet Take 650 mg by mouth every 4 (four) hours as needed for mild pain.       albuterol (VENTOLIN HFA) 108 (90 Base) MCG/ACT inhaler INHALE 2 PUFFS BY MOUTH EVERY 4 HOURS AS NEEDED 18 g 12   Albuterol Sulfate 2.5 MG/0.5ML NEBU Inhale 2.5 mg into the lungs every 6 (six) hours as needed (shortness of breath).       amLODipine (NORVASC) 2.5 MG tablet Take 1 tablet (2.5 mg total) by mouth daily. for blood pressure. 90 tablet 1   ascorbic acid (VITAMIN C) 500 MG tablet Take 1 tablet (500 mg total) by mouth daily. 30 tablet 2   aspirin 81 MG chewable tablet Chew 1 tablet (81 mg total) by mouth daily. Hold for now and resume on 12/22/22       buPROPion (WELLBUTRIN XL) 300 MG 24 hr tablet Take 1 tablet (300 mg total) by mouth daily with breakfast. for anxiety and depression. 90 tablet 0   busPIRone (BUSPAR) 30 MG tablet Take 30 mg by mouth 2 (two) times daily.       Calcium Carb-Cholecalciferol (CALCIUM/VITAMIN D PO) Take 1 tablet by mouth daily.       clonazePAM (KLONOPIN) 1 MG tablet Take 1 mg by mouth at bedtime.       conjugated estrogens (PREMARIN) vaginal cream Apply one pea-sized amount around the opening of the urethra daily for 2 weeks, then 3 times weekly moving forward. 30 g 4   ferrous  sulfate 325 (65 FE) MG tablet Take 1 tablet (325 mg total) by mouth daily with breakfast. 30 tablet 2   folic acid (FOLVITE) 1 MG tablet Take 1 mg by mouth daily.       glipiZIDE (GLUCOTROL XL) 2.5 MG 24 hr tablet TAKE 1 TABLET (2.5 MG TOTAL) BY MOUTH DAILY WITH BREAKFAST.  FOR DIABETES. 90 tablet 0   LACTOBACILLUS PROBIOTIC PO Take 1 capsule by mouth daily.       Melatonin 10 MG TABS Take 10 mg by mouth at bedtime.       memantine (NAMENDA) 5 MG tablet Take 1 tablet (5 mg total) by mouth 2 (two) times daily. For memory. 180 tablet 3   Menthol, Topical Analgesic, (BIOFREEZE) 4 % GEL Apply topically in the morning and at bedtime.       pantoprazole (PROTONIX) 40 MG tablet Take 1 tablet (40 mg total) by mouth daily. 30 tablet 2   rosuvastatin (CRESTOR) 20 MG tablet TAKE 1 TABLET (20 MG TOTAL) BY MOUTH EVERY EVENING. FOR CHOLESTEROL. 90 tablet 3   Tiotropium Bromide-Olodaterol (STIOLTO RESPIMAT) 2.5-2.5 MCG/ACT AERS Inhale 2 each into the lungs in the morning.       Continuous Blood Gluc Sensor (FREESTYLE LIBRE 3 SENSOR) MISC Place 1 sensor on the skin every 14 days. Use to check glucose continuously 6 each 1   PROLIA 60 MG/ML SOSY injection Inject 60 mg into the skin every 6 (six) months.          Home: Home Living Family/patient expects to be discharged to:: Private residence Living Arrangements: Children, Other (Comment) (granddaughter) Available Help at Discharge: Family, Available PRN/intermittently (daughter arranging 24/7 support) Type of Home: House Home Access: Stairs to enter Technical brewer of Steps: 1 Entrance Stairs-Rails: None Home Layout: Able to live on main level with bedroom/bathroom Bathroom Shower/Tub: Chiropodist: Handicapped height Bathroom Accessibility: Yes Home Equipment: Rollator (4 wheels), Shower seat, Grab bars - tub/shower, Toilet riser, Wheelchair - manual Additional Comments: lives with daughter (works full time) and grand child  (autistic)  Lives With: Daughter, Other (Comment) (granddaughter)  Functional History: Prior Function Prior Level of Function : Needs assist Mobility Comments: In December pt was modified independent ambulating with rollator but has had decline in status since January requiring assist for transfers and walking 10-20 feet (and 40-50 feet when going to adult day care). Functional Status:  Mobility: Bed Mobility Overal bed mobility: Needs Assistance Bed Mobility: Supine to Sit, Sit to Supine Supine to sit: Min assist Sit to supine: Min guard General bed mobility comments: vc's for technique Transfers Overall transfer level: Needs assistance Equipment used: Rolling walker (2 wheels) Transfers: Sit to/from Stand Sit to Stand: Min assist General transfer comment: pt defers citing headache Ambulation/Gait Ambulation/Gait assistance: Min guard, Min assist Gait Distance (Feet): 20 Feet Assistive device: Rolling walker (2 wheels) General Gait Details: partial step through gait pattern; intermittent assist for walker guidance Gait velocity: decreased   ADL: ADL Overall ADL's : Needs assistance/impaired General ADL Comments: MIN A face washing sitting EOB.   Cognition: Cognition Overall Cognitive Status: Impaired/Different from baseline Orientation Level: Oriented to person, Disoriented to place, Disoriented to time, Disoriented to situation Cognition Arousal/Alertness: Awake/alert Behavior During Therapy: WFL for tasks assessed/performed Overall Cognitive Status: Impaired/Different from baseline Area of Impairment: Orientation, Attention, Memory, Following commands Orientation Level: Disoriented to, Time, Situation Current Attention Level: Focused Memory: Decreased recall of precautions, Decreased short-term memory Following Commands: Follows one step commands inconsistently General Comments: increased time to respond to questions/cues. Pt unable to incorporate use of non-dominant  LUE into activites despite cueing   Blood pressure 123/61, pulse 93, temperature 99.2 F (37.3 C), resp. rate 18, height 5\' 2"  (1.575 m), weight 46.3 kg, SpO2 100 %. Physical Exam Gen: no distress, normal appearing HEENT: oral mucosa pink and moist, NCAT Cardio:  Reg rate Chest: normal effort, normal rate of breathing Abd: soft, non-distended Ext: no edema Psych: pleasant, normal affect Skin: incision is c/d/i Neuro: Alert and oriented x2, LUE strength is limited.    Lab Results Last 24 Hours       Results for orders placed or performed during the hospital encounter of 02/03/23 (from the past 24 hour(s))  Glucose, capillary     Status: Abnormal    Collection Time: 02/05/23  4:24 PM  Result Value Ref Range    Glucose-Capillary 189 (H) 70 - 99 mg/dL  Glucose, capillary     Status: Abnormal    Collection Time: 02/05/23  7:23 PM  Result Value Ref Range    Glucose-Capillary 267 (H) 70 - 99 mg/dL  Glucose, capillary     Status: None    Collection Time: 02/05/23 11:15 PM  Result Value Ref Range    Glucose-Capillary 77 70 - 99 mg/dL  Glucose, capillary     Status: Abnormal    Collection Time: 02/06/23  3:18 AM  Result Value Ref Range    Glucose-Capillary 137 (H) 70 - 99 mg/dL  Glucose, capillary     Status: Abnormal    Collection Time: 02/06/23  7:29 AM  Result Value Ref Range    Glucose-Capillary 139 (H) 70 - 99 mg/dL  Glucose, capillary     Status: Abnormal    Collection Time: 02/06/23 11:35 AM  Result Value Ref Range    Glucose-Capillary 181 (H) 70 - 99 mg/dL      Imaging Results (Last 48 hours)  No results found.     Assessment/Plan: Diagnosis: s/p right burr hole for evacuation of SDH.  Does the need for close, 24 hr/day medical supervision in concert with the patient's rehab needs make it unreasonable for this patient to be served in a less intensive setting? Yes Co-Morbidities requiring supervision/potential complications:  Altered mental status Postoperative pain:  continue prn tylenol History of dementia History of CVA Primary HTN: monitor BP TID COPD: continue to monitor respirations Due to bladder management, bowel management, safety, skin/wound care, disease management, medication administration, pain management, and patient education, does the patient require 24 hr/day rehab nursing? Yes Does the patient require coordinated care of a physician, rehab nurse, therapy disciplines of PT, OT, SLP to address physical and functional deficits in the context of the above medical diagnosis(es)? Yes Addressing deficits in the following areas: balance, endurance, locomotion, strength, transferring, bowel/bladder control, bathing, dressing, feeding, grooming, toileting, and cognition Can the patient actively participate in an intensive therapy program of at least 3 hrs of therapy per day at least 5 days per week? Yes The potential for patient to make measurable gains while on inpatient rehab is excellent Anticipated functional outcomes upon discharge from inpatient rehab are supervision  with PT, supervision with OT, supervision with SLP. Estimated rehab length of stay to reach the above functional goals is: 10-14 days Anticipated discharge destination: Home Overall Rehab/Functional Prognosis: excellent   POST ACUTE RECOMMENDATIONS: This patient's condition is appropriate for continued rehabilitative care in the following setting: CIR Patient has agreed to participate in recommended program. Yes Note that insurance prior authorization may be required for reimbursement for recommended care.     I have personally performed a face to face diagnostic evaluation of this patient. Additionally, I have examined the patient's medical record including any pertinent labs and radiographic images. If the physician assistant has documented in this note, I have reviewed and edited or otherwise concur with the  physician assistant's documentation.   Thanks,   Amanda Ribas, MD 02/06/2023          Routing History

## 2023-02-14 DIAGNOSIS — S065XAA Traumatic subdural hemorrhage with loss of consciousness status unknown, initial encounter: Secondary | ICD-10-CM | POA: Diagnosis not present

## 2023-02-14 LAB — GLUCOSE, CAPILLARY
Glucose-Capillary: 152 mg/dL — ABNORMAL HIGH (ref 70–99)
Glucose-Capillary: 137 mg/dL — ABNORMAL HIGH (ref 70–99)
Glucose-Capillary: 164 mg/dL — ABNORMAL HIGH (ref 70–99)
Glucose-Capillary: 173 mg/dL — ABNORMAL HIGH (ref 70–99)

## 2023-02-14 MED ORDER — DIPHENHYDRAMINE-ZINC ACETATE 2-0.1 % EX CREA
TOPICAL_CREAM | Freq: Two times a day (BID) | CUTANEOUS | Status: DC
  Filled 2023-02-14: qty 28

## 2023-02-14 NOTE — Evaluation (Addendum)
Physical Therapy Assessment and Plan  Patient Details  Name: Amanda Porter MRN: [id number] Date of Birth: [birth year]  PT Diagnosis: Abnormal posture, Abnormality of gait, Cognitive deficits, Coordination disorder, Difficulty walking, Hemiparesis non-dominant, Impaired cognition, Impaired sensation, and Muscle weakness Rehab Potential: Good ELOS: 10-12 days   Today's Date: 02/14/2023 PT Individual Time: 6387-5643 -    PT Individual Treatment Time: 58 min  Hospital Problem: Principal Problem:   SDH (subdural hematoma) (HCC)   Past Medical History:  Past Medical History:  Diagnosis Date   Acute respiratory failure requiring reintubation (HCC) 11/05/2021   Anemia    Anxiety    Anxiety and depression    COPD (chronic obstructive pulmonary disease) (HCC)    CVA (cerebral vascular accident) (HCC)    CVA (cerebral vascular accident) (HCC)    L sided deficits   Dementia (HCC)    Depression    Diabetes mellitus (HCC)    Type 2   Diabetes mellitus without complication (HCC)    Dysphagia    Dysphasia    Falls    GERD (gastroesophageal reflux disease)    HCAP (healthcare-associated pneumonia) 12/16/2022   History of lung cancer    Hyperlipidemia    Hypertension    Influenza A 12/13/2021   Memory disturbance 11/20/2017   Normal pressure hydrocephalus (HCC) 2018   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   Past Surgical History:  Past Surgical History:  Procedure Laterality Date   BURR HOLE Right 02/03/2023   Procedure: BURR HOLE X 1, ventriculoperitoneal shunt removal;  Surgeon: Venetia Night, MD;  Location: [facility];  Service: Neurosurgery;  Laterality: Right;   ESOPHAGOGASTRODUODENOSCOPY (EGD) WITH PROPOFOL N/A 12/16/2022   Procedure:  ESOPHAGOGASTRODUODENOSCOPY (EGD) WITH PROPOFOL;  Surgeon: Midge Minium, MD;  Location: [facility];  Service: Endoscopy;  Laterality: N/A;   HIP ARTHROPLASTY Right 09/07/2022   Procedure: ARTHROPLASTY BIPOLAR HIP (HEMIARTHROPLASTY);  Surgeon: Lyndle Herrlich, MD;  Location: [facility];  Service: Orthopedics;  Laterality: Right;   IR FL GUIDED LOC OF NEEDLE/CATH TIP FOR SPINAL INJECTION RT  07/25/2022   LUNG REMOVAL, PARTIAL     left upper lobe   SHUNT REVISION VENTRICULAR-PERITONEAL N/A 09/19/2022   Procedure: VENTRICULOPERITONEAL SHUNT REVISION;  Surgeon: Venetia Night, MD;  Location: [facility];  Service: Neurosurgery;  Laterality: N/A;   VENTRICULOPERITONEAL SHUNT  12/09/2016   VENTRICULOPERITONEAL SHUNT  10/2018    Assessment & Plan Clinical Impression: Patient is a 77 y.o. right-handed female with history of COPD with history of lung cancer status post upper left lobectomy, former smoker,, chronic anemia, anxiety/depression/dementia maintained on Namenda, CVA with left-sided deficits, diabetes mellitus type 2, hyperlipidemia, hypertension, tardive dyskinesia, recurrent UTIs, dementia, right THA 09/07/2022 per Dr. Odis Luster, right VP shunt replacement for NPH about 5 months ago per Dr. Venetia Night. Per chart review patient currently lives with her daughter and autistic grandchild. Patient attends adult daycare. Two-level home bed and bath on main level. Dating back to December patient was modified independent ambulate with a rollator with steady decline since January requiring assist for transfers ambulating only short distances and family also reports increased confusion. Family planning assist as needed on discharge. Presented to [facility] 02/03/2023 with increasing confusion and gait disorder. Cranial CT scan showed mixed  density right cerebral convexity subdural hematoma significantly increased in size since 01/12/2023 currently measuring up to 2.4 cm with worsening mass effect and 1.3 cm rightward  midline shift. Stable position of right frontal ventricular catheter without evidence of hydrocephalus or trapped ventricle. Patient underwent right frontal burr hole for drainage of subdural hematoma 02/03/2023 per Dr. Myer Haff and VP shunt removed. Latest cranial CT scan 02/09/2023 shows decreased size of right subdural collection now 6 mm decreased right to left midline shift now 4 mm. Hospital course patient was cleared to resume low-dose aspirin. Lovenox was added for DVT prophylaxis 02/05/2023. Patient is tolerating a regular consistency diet. Patient with chronic anemia with hemoglobin dating back to 12/26/2022 of 8.7 currently 8.0. Palliative care consulted to establish goals of care. Therapy evaluations completed due to patient's decreased functional mobility altered mental status was admitted for a comprehensive rehab program. .  Patient transferred to CIR on 02/13/2023 .   Patient currently requires min with mobility secondary to muscle weakness, decreased cardiorespiratoy endurance and decreased oxygen support, decreased visual acuity, decreased memory and delayed processing, and decreased sitting balance, decreased standing balance, decreased postural control, hemiplegia, and decreased balance strategies.  Prior to hospitalization, patient was modified independent  with mobility and lived with Daughter, Other (Comment) (granddaughter) in a House home.  Home access is 1Stairs to enter.  Patient will benefit from skilled PT intervention to maximize safe functional mobility, minimize fall risk, and decrease caregiver burden for planned discharge home with 24 hour supervision.  Anticipate patient will benefit from follow up HH at discharge.  PT - End of Session Activity Tolerance: Tolerates 10 - 20 min activity with multiple rests Endurance Deficit: Yes Endurance Deficit Description: Rest breaks needed PT Assessment Rehab Potential (ACUTE/IP ONLY): Good PT Barriers to Discharge: Inaccessible home  environment;Decreased caregiver support;Home environment access/layout;Insurance for SNF coverage PT Barriers to Discharge Comments: Pt's memory deficits and decreased motor control PT Patient demonstrates impairments in the following area(s): Balance;Safety;Sensory;Endurance;Motor PT Transfers Functional Problem(s): Bed Mobility;Bed to Chair;Car PT Locomotion Functional Problem(s): Ambulation;Wheelchair Mobility;Stairs PT Plan PT Intensity: Minimum of 1-2 x/day ,45 to 90 minutes PT Frequency: 5 out of 7 days PT Duration Estimated Length of Stay: 10-12 days PT Treatment/Interventions: Ambulation/gait training;Community reintegration;DME/adaptive equipment instruction;Neuromuscular re-education;Psychosocial support;Stair training;UE/LE Strength taining/ROM;Wheelchair propulsion/positioning;Balance/vestibular training;Discharge planning;Functional electrical stimulation;Pain management;Therapeutic Activities;UE/LE Coordination activities;Skin care/wound management;Cognitive remediation/compensation;Disease management/prevention;Functional mobility training;Patient/family education;Splinting/orthotics;Therapeutic Exercise;Visual/perceptual remediation/compensation PT Transfers Anticipated Outcome(s): Mod I PT Locomotion Anticipated Outcome(s): Supervision Assist   PT Evaluation Precautions/Restrictions Precautions Precautions: Fall Precaution Comments: monitor HR & O2, L sided weakness Restrictions Weight Bearing Restrictions: No General   Vital Signs  Pain Pain Assessment Pain Scale: 0-10 Pain Score: 8  Pain Type: Acute pain Pain Location: Head Pain Descriptors / Indicators: Headache Pain Intervention(s): Medication (See eMAR) Pain Interference Pain Interference Pain Effect on Sleep: 1. Rarely or not at all Pain Interference with Therapy Activities: 1. Rarely or not at all Pain Interference with Day-to-Day Activities: 1. Rarely or not at all Home Living/Prior Functioning Home  Living Available Help at Discharge: Family;Available PRN/intermittently (daughter arranging 24/7 support) Type of Home: House Home Access: Stairs to enter [employer] of Steps: 1 Entrance Stairs-Rails: None Home Layout: Able to live on main level with bedroom/bathroom Bathroom Shower/Tub: Engineer, manufacturing systems: Handicapped height Bathroom Accessibility: Yes Additional Comments: All prior history obtained from acute due to no family member present to confirm details and pt is a poor historian/cog deficits. Lives with daughter (works full time) and grand child (autistic)  Lives With: Daughter;Other (Comment) (granddaughter) Prior Function Level of Independence: Requires assistive device for independence;Needs assistance with ADLs Vision/Perception  Vision - History Ability to See in Adequate Light: 1 Impaired Vision - Assessment Eye Alignment: Within Functional Limits Ocular Range of Motion: Within Functional Limits Alignment/Gaze Preference: Within Defined Limits Tracking/Visual Pursuits: Requires cues, head turns, or add eye shifts to track Saccades: Decreased speed of saccadic movement Convergence: Within functional limits Additional Comments: pt poor historian, L side peripheral seeminly impaired Perception Perception: Within Functional Limits Praxis Praxis: Intact  Cognition Overall Cognitive Status: Impaired/Different from baseline Arousal/Alertness: Awake/alert Orientation Level: Oriented to person;Oriented to place;Disoriented to situation;Disoriented to time Memory: Impaired Awareness: Impaired Problem Solving: Impaired Safety/Judgment: Appears intact Sensation Sensation Light Touch: Impaired by gross assessment Hot/Cold: Not tested Proprioception: Impaired by gross assessment Stereognosis: Not tested Additional Comments: Reports numbness with diminished sensation in distal fingers Coordination Gross Motor Movements are Fluid and Coordinated:  No Fine Motor Movements are Fluid and Coordinated: No Finger Nose Finger Test: impaired L >R due to history of CVA with L sided weakness Motor  Motor Motor: Hemiplegia Motor - Skilled Clinical Observations: minor L sided weakness compared to R   Trunk/Postural Assessment  Cervical Assessment Cervical Assessment: Within Functional Limits Thoracic Assessment Thoracic Assessment: Exceptions to [facility] (rounded shoulders) Lumbar Assessment Lumbar Assessment: Exceptions to [facility] (posterior pelvic tilt) Postural Control Postural Control: Deficits on evaluation  Balance Balance Balance Assessed: Yes Static Sitting Balance Static Sitting - Balance Support: Feet supported;Bilateral upper extremity supported Static Sitting - Level of Assistance: 5: Stand by assistance (supervision) Dynamic Sitting Balance Dynamic Sitting - Balance Support: Feet supported;No upper extremity supported Dynamic Sitting - Level of Assistance: 5: Stand by assistance (CGA) Static Standing Balance Static Standing - Balance Support: During functional activity Static Standing - Level of Assistance: 5: Stand by assistance (CGA) Dynamic Standing Balance Dynamic Standing - Balance Support: During functional activity;Right upper extremity supported Dynamic Standing - Level of Assistance: 4: Min assist Extremity Assessment      RLE Assessment RLE Assessment: Exceptions to [facility] General Strength Comments: grossly 4-/5 LLE Assessment LLE Assessment: Exceptions to [facility] General Strength Comments: grossly 3/5  Care Tool Care Tool Bed Mobility Roll left and right activity   Roll left and right assist level: Supervision/Verbal cueing    Sit to lying activity   Sit to lying assist level: Supervision/Verbal cueing    Lying to sitting on side of bed activity         Care Tool Transfers Sit to stand transfer   Sit to stand assist level: Minimal Assistance - Patient > 75%    Chair/bed transfer   Chair/bed transfer  assist level: Minimal Assistance - Patient > 75%     Toilet transfer   Assist Level: Minimal Assistance - Patient > 75%    Car transfer   Car transfer assist level: Minimal Assistance - Patient > 75%      Care Tool Locomotion Ambulation   Assist level: Minimal Assistance - Patient > 75% Assistive device: Walker-rolling Max distance: 20 ft  Walk 10 feet activity   Assist level: Minimal Assistance - Patient > 75% Assistive device: Walker-rolling   Walk 50 feet with 2 turns activity   Assist level: Minimal Assistance - Patient > 75% Assistive device: Walker-rolling  Walk 150 feet activity Walk 150 feet activity did not occur: Safety/medical concerns      Walk 10 feet on uneven surfaces activity Walk 10 feet on uneven surfaces activity did not occur:  Safety/medical concerns Assist level: Minimal Assistance - Patient > 75% Assistive device: Walker-rolling  Stairs   Assist level: Minimal Assistance - Patient > 75% Stairs assistive device: 2 hand rails Max number of stairs: 4  Walk up/down 1 step activity   Walk up/down 1 step (curb) assist level: Minimal Assistance - Patient > 75% Walk up/down 1 step or curb assistive device: 2 hand rails  Walk up/down 4 steps activity   Walk up/down 4 steps assist level: Minimal Assistance - Patient > 75% Walk up/down 4 steps assistive device: 2 hand rails  Walk up/down 12 steps activity Walk up/down 12 steps activity did not occur: Safety/medical concerns      Pick up small objects from floor Pick up small object from the floor (from standing position) activity did not occur: Safety/medical concerns      Wheelchair Is the patient using a wheelchair?: Yes Type of Wheelchair: Manual   Wheelchair assist level: Total Assistance - Patient < 25% Max wheelchair distance: 258ft  Wheel 50 feet with 2 turns activity   Assist Level: Total Assistance - Patient < 25%  Wheel 150 feet activity   Assist Level: Total Assistance - Patient < 25%     Refer to Care Plan for Long Term Goals  SHORT TERM GOAL WEEK 1 PT Short Term Goal 1 (Week 1): Pt will perform >148ft of gait w/ Min A using LRAD PT Short Term Goal 2 (Week 1): Pt will be CGA for transfers PT Short Term Goal 3 (Week 1): Pt will tolerate standing for >2 minutes  Recommendations for other services: Therapeutic Recreation  Pet therapy, Kitchen group, Stress management, and Outing/community reintegration  Skilled Therapeutic Intervention Mobility Bed Mobility Bed Mobility: Rolling Right;Sitting - Scoot to [employer] of Bed;Sit to Supine;Supine to [employer] Right: Contact Guard/Touching assist Supine to Sit: Contact Guard/Touching assist Sitting - Scoot to Edge of Bed: Supervision/Verbal cueing Sit to Supine: Contact Guard/Touching assist Transfers Transfers: Sit to Stand;Stand Pivot Transfers;Stand to Sit Sit to Stand: Minimal Assistance - Patient > 75% Stand to Sit: Minimal Assistance - Patient > 75% Stand Pivot Transfers: Minimal Assistance - Patient > 75% Stand Pivot Transfer Details: Verbal cues for sequencing;Verbal cues for technique;Verbal cues for safe use of DME/AE Transfer (Assistive device): Rolling walker (and 1 person HHA) Locomotion  Gait Ambulation: Yes Gait Assistance: Contact Guard/Touching assist Gait Distance (Feet): 20 Feet Assistive device: Rolling walker Gait Gait: Yes Gait Pattern: Step-through pattern;Narrow base of support;Poor foot clearance - left;Poor foot clearance - right Gait velocity: decreased Stairs / Additional Locomotion Stairs: Yes Stairs Assistance: Minimal Assistance - Patient > 75% Stair Management Technique: Two rails Number of Stairs: 4 Height of Stairs: 6 Wheelchair Mobility Wheelchair Mobility: Yes Wheelchair Assistance: Total Assistance - Patient <25% Wheelchair Propulsion: Both upper extremities Distance: 200 ft  Skilled Therapeutic Intervention  Pt received in L side lying in bed and agreeable to PT services.  New pt evaluation performed by therapist as noted above. Pt currently requires Min A fr transfers and mobility. Min A for sequencing and pattern of movement, and AD management. Prior to hospitalization pt was Mod I and expresses that she was using a RW to aide in independence. Pt has a hx of dementia prior to brain injury, making pt a poor historian. Pt's caregiver were not present throughout session. Throughout session pt complained of moderate headache pain, and continually requested tylenol. Therapist informed Nurse of this.   Pt performed supine<>sit at EOB w/ Min A for power up.  From there sit<>stand w/ Min A and vc for hand placement to assist in transition. Pt performed performed ~35ft w/ RW w/ Min A before requesting to sit. Pt ambulated to [facility] and performed a pivot with Min A for RW management. Pt sat with poor eccentric control. Pt transported to therapy gym dependently for energy conservation. Pt performed ~20 ft of gait to transfer in/out of car w/ Min A. Stairs performed x4 w/ Min A. Pt required a standing rest break after ascending 4 stairs. Pt cued on breathing pattern to assist in breath control. Pt then descended 4 stairs w/ Min A for motor control. Attempted WC mobility, pt was able to navigate ~20 feet w/ Max A. Noted veering to the L due to L side weakness. Pt then became fatigued. Pt transported back to room dependently. Performed ~49ft of gait w/ HHA and required vc to remain on task. Pt sat at EOB w/ improved eccentric control. Pt returned to supine w/ supervision A and was left w/ HOB elevated,  bed alarm on, and all needs met.    Discharge Criteria: Patient will be discharged from PT if patient refuses treatment 3 consecutive times without medical reason, if treatment goals not met, if there is a change in medical status, if patient makes no progress towards goals or if patient is discharged from hospital.  The above assessment, treatment plan, treatment alternatives and goals were  discussed and mutually agreed upon: by patient  Kayston Jodoin 02/14/2023, 1:45 PM

## 2023-02-14 NOTE — Progress Notes (Signed)
PROGRESS NOTE   Subjective/Complaints:  No acute complaints.  No events overnight.  Labs stable.  Vital stable.  Had long discussion with daughter this a.m. regarding use of as needed oxygen that was removed during therapies today, see discussion as below.  Patient wears 3 L of oxygen continuously at home and has tolerated this for some time, has extensive respiratory history.  In regards to posterior scalp rash, patient's daughter showed pictures of raised bulla that appeared along with back of her head and chin immediately postop.  This is much improved, has been using Benadryl cream.  She notes her mother has a an allergy to triple antibiotic ointment.  They are unsure what caused the initial rash.  ROS: Denies fevers, chills, N/V, abdominal pain, constipation, diarrhea, SOB, cough, chest pain, new weakness or paraesthesias.    Objective:   No results found. Recent Labs    02/13/23 1802  WBC 11.4*  HGB 8.4*  HCT 27.2*  PLT 446*   Recent Labs    02/13/23 1802  CREATININE 0.58    Intake/Output Summary (Last 24 hours) at 02/14/2023 2312 Last data filed at 02/14/2023 1256 Gross per 24 hour  Intake 360 ml  Output --  Net 360 ml        Physical Exam: Vital Signs Blood pressure 139/85, pulse 98, temperature 99.2 F (37.3 C), temperature source Oral, resp. rate 16, height 5\' 2"  (1.575 m), weight 36.4 kg, SpO2 100 %. Physical Exam:   Constitutional: No apparent distress. Appropriate appearance for age.  HENT: No JVD. Neck Supple. Trachea midline.  Eyes: PERRLA. EOMI. + mild L exotropia Cardiovascular: RRR, no murmurs/rub/gallops. No Edema. Peripheral pulses 2+  Respiratory: Mild L basilar crackles.  + 2L [place] Abdomen: + bowel sounds, normoactive. No distention or tenderness.  GU: Not examined.   Skin: C/D/I. + erythema and excoriations on L posterior scalp. Hemicrani site c/d/I, well healed.  MSK:      No apparent  deformity.      Strength: 4/5 throughout LUE and LLE 5/5 throughout RUE and RLE Working with therapies on sit to stand, with minimal assistance.  Appears to be tolerating therapies well.   Neurologic exam:  Cognition: AAO to person; place with cues; not time - repeats "Friday" for month and year Language: Fluent, No substitutions or neoglisms. No dysarthria. Names 3/3 objects correctly  Memory: +deficits, moderate Insight: Poor insight into current condition.  Mood: Flat affect, appropriate mood.  Sensation: To light touch intact in BL UEs and LEs  Reflexes: 1+ in BL UE and LEs. Negative Hoffman's and babinski signs bilaterally.  CN:+ L facial droop Coordination: + ataxia on FTN L>R  Spasticity: MAS 2 L elbow, shoulder; MAS 4 vs contracture L PF    Assessment/Plan: 1. Functional deficits which require 3+ hours per day of interdisciplinary therapy in a comprehensive inpatient rehab setting. Physiatrist is providing close team supervision and 24 hour management of active medical problems listed below. Physiatrist and rehab team continue to assess barriers to discharge/monitor patient progress toward functional and medical goals  Care Tool:  Bathing    Body parts bathed by patient: Right arm, Left arm, Chest, Abdomen, Left upper  leg, Face   Body parts bathed by helper: Front perineal area, Buttocks, Right upper leg, Right lower leg, Left lower leg     Bathing assist Assist Level: Moderate Assistance - Patient 50 - 74%     Upper Body Dressing/Undressing Upper body dressing   What is the patient wearing?: Pull over shirt    Upper body assist Assist Level: Minimal Assistance - Patient > 75%    Lower Body Dressing/Undressing Lower body dressing      What is the patient wearing?: Pants     Lower body assist Assist for lower body dressing: Maximal Assistance - Patient 25 - 49%     Toileting Toileting    Toileting assist Assist for toileting: Maximal Assistance - Patient  25 - 49%     Transfers Chair/bed transfer  Transfers assist     Chair/bed transfer assist level: Minimal Assistance - Patient > 75%     Locomotion Ambulation   Ambulation assist      Assist level: Minimal Assistance - Patient > 75% Assistive device: Walker-rolling Max distance: 20 ft   Walk 10 feet activity   Assist     Assist level: Minimal Assistance - Patient > 75% Assistive device: Walker-rolling   Walk 50 feet activity   Assist    Assist level: Minimal Assistance - Patient > 75% Assistive device: Walker-rolling    Walk 150 feet activity   Assist Walk 150 feet activity did not occur: Safety/medical concerns         Walk 10 feet on uneven surface  activity   Assist Walk 10 feet on uneven surfaces activity did not occur: Safety/medical concerns   Assist level: Minimal Assistance - Patient > 75% Assistive device: Walker-rolling   Wheelchair     Assist Is the patient using a wheelchair?: Yes Type of Wheelchair: Manual    Wheelchair assist level: Total Assistance - Patient < 25% Max wheelchair distance: 278ft    Wheelchair 50 feet with 2 turns activity    Assist        Assist Level: Total Assistance - Patient < 25%   Wheelchair 150 feet activity     Assist      Assist Level: Total Assistance - Patient < 25%   Blood pressure 139/85, pulse 98, temperature 99.2 F (37.3 C), temperature source Oral, resp. rate 16, height 5\' 2"  (1.575 m), weight 36.4 kg, SpO2 100 %.  Medical Problem List and Plan: 1. Functional deficits secondary to worsening subdural hematoma status post right frontal burr hole 02/03/2023 and VP shunt removed with  history of recent right VP shunt replacement for NPH approximately 5 months ago followed by neurosurgery Dr. Meade Maw as well as history of CVA with left-sided residual weakness.             -patient may shower             -ELOS/Goals: 10-14 days, SPV PT/OT/SLP 2.   Antithrombotics: -DVT/anticoagulation:  Pharmaceutical: Lovenox initiated 02/05/2023             -antiplatelet therapy: Aspirin 81 mg daily 3. Pain Management: Tylenol as needed 4. Mood/Behavior/Sleep/anxiety/depression: Wellbutrin 300 mg daily, BuSpar 30 mg twice daily, Namenda 5 mg twice daily, melatonin 10 mg nightly             -antipsychotic agents: N/A 5. Neuropsych/cognition: This patient is not capable of making decisions on her own behalf. 6. Skin/Wound Care: Routine skin checks -Benadryl cream to posterior head rash twice daily.  Patient  has allergy to triple antibiotic ointment.  7. Fluids/Electrolytes/Nutrition: Routine in and outs with follow-up chemistries -Admission labs stable, monitor.  8.  Chronic COPD without exacerbation as well as history of lung cancer status post right upper lobe lobectomy, former smoker.  Supplemental oxygen as needed.  Continue inhalers as directed - 3/16: Discussed with family, patient on continuous oxygen 2 to 3 L at home, significant respiratory history with no cognitive deficits.  Daughter is concerned that as needed oxygen will confuse her, and she will get used to removing it, which will make it more difficult to manage at home and is something she is witnessed already today.  This is a reasonable consideration, replaced oxygen orders with continuous with goal sats 89 to 97%.  9.  Hyperlipidemia.  Crestor 10.  Hypertension.  Currently on Norvasc 2.5 mg daily.  Blood pressure has been soft losartan currently on hold.    02/14/2023    7:35 PM 02/14/2023    2:40 PM 02/14/2023    3:07 AM  Vitals with BMI  Weight   80 lbs 4 oz  BMI   0000000  Systolic [id number]   Diastolic 85 68   Pulse 98 97      11.  Diabetes mellitus.  Hemoglobin A1c 6.8.  SSI/Glucotrol 2.5 mg daily Recent Labs    02/14/23 1139 02/14/23 1627 02/14/23 2105  GLUCAP 137* 164* 173*     12.  Chronic anemia.  Continue iron supplement.  Follow-up CBC 13.  Recurrent UTIs.   Monitor for any dysuria or hematuria 14.  Right THA 09/07/2022.  Weightbearing as tolerated 15.  Nose colonized with MRSA.  Bactroban as indicated 16.  GERD.  Protonix    LOS: 1 days A FACE TO FACE EVALUATION WAS PERFORMED  Gertie Gowda 02/14/2023, 11:12 PM

## 2023-02-14 NOTE — Plan of Care (Signed)
  Problem: RH Balance Goal: LTG Patient will maintain dynamic sitting balance (PT) Description: LTG:  Patient will maintain dynamic sitting balance with assistance during mobility activities (PT) Flowsheets (Taken 02/14/2023 1338) LTG: Pt will maintain dynamic sitting balance during mobility activities with:: Independent with assistive device  Goal: LTG Patient will maintain dynamic standing balance (PT) Description: LTG:  Patient will maintain dynamic standing balance with assistance during mobility activities (PT) 02/14/2023 1342 by Sudie Grumbling, Dashan Chizmar, Student-PT Flowsheets (Taken 02/14/2023 1342) LTG: Pt will maintain dynamic standing balance during mobility activities with:: Independent with assistive device   Problem: Sit to Stand Goal: LTG:  Patient will perform sit to stand with assistance level (PT) Description: LTG:  Patient will perform sit to stand with assistance level (PT) Flowsheets (Taken 02/14/2023 1338) LTG: PT will perform sit to stand in preparation for functional mobility with assistance level: Independent with assistive device   Problem: RH Bed Mobility Goal: LTG Patient will perform bed mobility with assist (PT) Description: LTG: Patient will perform bed mobility with assistance, with/without cues (PT). Flowsheets (Taken 02/14/2023 1338) LTG: Pt will perform bed mobility with assistance level of: Independent with assistive device    Problem: RH Bed to Chair Transfers Goal: LTG Patient will perform bed/chair transfers w/assist (PT) Description: LTG: Patient will perform bed to chair transfers with assistance (PT). 02/14/2023 1342 by Sudie Grumbling, Tadao Emig, Student-PT Flowsheets (Taken 02/14/2023 1342) LTG: Pt will perform Bed to Chair Transfers with assistance level: Set up assist    Problem: RH Car Transfers Goal: LTG Patient will perform car transfers with assist (PT) Description: LTG: Patient will perform car transfers with assistance (PT). 02/14/2023 1342 by Sudie Grumbling, Cecia Egge,  Student-PT Flowsheets (Taken 02/14/2023 1342) LTG: Pt will perform car transfers with assist:: Set up assist    Problem: RH Ambulation Goal: LTG Patient will ambulate in controlled environment (PT) Description: LTG: Patient will ambulate in a controlled environment, # of feet with assistance (PT). Flowsheets (Taken 02/14/2023 1338) LTG: Pt will ambulate in controlled environ  assist needed:: Supervision/Verbal cueing LTG: Ambulation distance in controlled environment: 146ft Goal: LTG Patient will ambulate in home environment (PT) Description: LTG: Patient will ambulate in home environment, # of feet with assistance (PT). Flowsheets (Taken 02/14/2023 1338) LTG: Pt will ambulate in home environ  assist needed:: Supervision/Verbal cueing Goal: LTG Patient will ambulate in community environment (PT) Description: LTG: Patient will ambulate in community environment, # of feet with assistance (PT). Flowsheets (Taken 02/14/2023 1338) LTG: Pt will ambulate in community environ  assist needed:: Supervision/Verbal cueing  Problem: RH Stairs Goal: LTG Patient will ambulate up and down stairs w/assist (PT) Description: LTG: Patient will ambulate up and down # of stairs with assistance (PT) Flowsheets (Taken 02/14/2023 1338) LTG: Pt will  ambulate up and down number of stairs: 4

## 2023-02-14 NOTE — Evaluation (Signed)
Occupational Therapy Assessment and Plan  Patient Details  Name: Amanda Porter MRN: [id number] Date of Birth: 04/25/1946  OT Diagnosis: abnormal posture, acute pain, cognitive deficits, hemiplegia affecting non-dominant side, and muscle weakness (generalized) Rehab Potential: Rehab Potential (ACUTE ONLY): Fair ELOS: 10-12 days   Today's Date: 02/14/2023 OT Individual Time: 0800-0910 OT Individual Time Calculation (min): 70 min     Hospital Problem: Principal Problem:   SDH (subdural hematoma) ([place])   Past Medical History:  Past Medical History:  Diagnosis Date   Acute respiratory failure requiring reintubation ([place]) 11/05/2021   Anemia    Anxiety    Anxiety and depression    COPD (chronic obstructive pulmonary disease) ([facility])    CVA (cerebral vascular accident) ([facility])    CVA (cerebral vascular accident) ([place])    L sided deficits   Dementia ([place])    Depression    Diabetes mellitus ([place])    Type 2   Diabetes mellitus without complication ([place])    Dysphagia    Dysphasia    Falls    GERD (gastroesophageal reflux disease)    HCAP (healthcare-associated pneumonia) 12/16/2022   History of lung cancer    Hyperlipidemia    Hypertension    Influenza A 12/13/2021   Memory disturbance 11/20/2017   Normal pressure hydrocephalus ([place]) 2018   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   Past Surgical History:  Past Surgical History:  Procedure Laterality Date   BURR HOLE Right 02/03/2023   Procedure: BURR HOLE X 1, ventriculoperitoneal shunt removal;  Surgeon: Meade Maw, MD;  Location: [facility];  Service: Neurosurgery;  Laterality: Right;   ESOPHAGOGASTRODUODENOSCOPY (EGD) WITH PROPOFOL N/A 12/16/2022   Procedure: ESOPHAGOGASTRODUODENOSCOPY (EGD) WITH  PROPOFOL;  Surgeon: Lucilla Lame, MD;  Location: [facility];  Service: Endoscopy;  Laterality: N/A;   HIP ARTHROPLASTY Right 09/07/2022   Procedure: ARTHROPLASTY BIPOLAR HIP (HEMIARTHROPLASTY);  Surgeon: Lovell Sheehan, MD;  Location: [facility] OR[facility];  Service: Orthopedics;  Laterality: Right;   IR FL GUIDED LOC OF NEEDLE/CATH TIP FOR SPINAL INJECTION RT  07/25/2022   LUNG REMOVAL, PARTIAL     left upper lobe   SHUNT REVISION VENTRICULAR-PERITONEAL N/A 09/19/2022   Procedure: VENTRICULOPERITONEAL SHUNT REVISION;  Surgeon: Meade Maw, MD;  Location: [facility];  Service: Neurosurgery;  Laterality: N/A;   VENTRICULOPERITONEAL SHUNT  12/09/2016   VENTRICULOPERITONEAL SHUNT  10/2018    Assessment & Plan Clinical Impression:  Amanda Porter is a 77 year old right-handed female with history of COPD with history of lung cancer status post upper left lobectomy, former smoker,, chronic anemia, anxiety/depression/dementia maintained on [place], CVA with left-sided deficits, diabetes mellitus type 2, hyperlipidemia, hypertension, tardive dyskinesia, recurrent UTIs, dementia, right THA 09/07/2022 per Dr. Harlow Mares, right VP shunt replacement for NPH about 5 months ago per Dr. Meade Maw. Per chart review patient currently lives with her daughter and autistic grandchild. Patient attends adult daycare. Two-level home bed and bath on main level. Dating back to December patient was modified independent ambulate with a rollator with steady decline since January requiring assist for transfers ambulating only short distances and family also reports increased confusion. Family planning assist as needed on discharge. Presented to [facility] 02/03/2023 with increasing confusion and gait disorder. Cranial CT scan showed mixed  density right cerebral convexity subdural hematoma significantly increased in size since 01/12/2023 currently measuring up to 2.4 cm with worsening mass effect and 1.3 cm rightward midline shift. Stable  position of right frontal ventricular catheter without evidence of hydrocephalus or trapped ventricle. Patient underwent right frontal burr hole for drainage of subdural hematoma 02/03/2023 per Dr. [name] and VP shunt removed. Latest cranial CT scan 02/09/2023 shows decreased size of right subdural collection now 6 mm decreased right to left midline shift now 4 mm. Hospital course patient was cleared to resume low-dose aspirin. Lovenox was added for DVT prophylaxis 02/05/2023. Patient is tolerating a regular consistency diet. Patient with chronic anemia with hemoglobin dating back to 12/26/2022 of 8.7 currently 8.0. Palliative care consulted to establish goals of care. Therapy evaluations completed due to patient's decreased functional mobility altered mental status was admitted for a comprehensive rehab program. Patient transferred to [facility] on 02/13/2023 .    Patient currently requires max with basic self-care skills secondary to muscle weakness, decreased cardiorespiratoy endurance, decreased coordination and decreased motor planning, decreased initiation, decreased attention, decreased awareness, decreased problem solving, and decreased memory, and decreased standing balance and hemiplegia.  Prior to hospitalization, patient could complete self-care with modified independent .  Patient will benefit from skilled intervention to decrease level of assist with basic self-care skills and increase independence with basic self-care skills prior to discharge home with care partner.  Anticipate patient will require 24 hour supervision and follow up home health.  OT - End of Session Activity Tolerance: Tolerates < 10 min activity, no significant change in vital signs Endurance Deficit: Yes Endurance Deficit Description: Rest breaks needed even with transition to EOB OT Assessment Rehab Potential (ACUTE ONLY): Fair OT Barriers to Discharge: Home environment access/layout;Lack of/limited family support OT Patient  demonstrates impairments in the following area(s): Balance;Cognition;Endurance;Motor;Pain;Sensory OT Basic ADL's Functional Problem(s): Grooming;Bathing;Dressing;Toileting OT Transfers Functional Problem(s): Toilet;Tub/Shower OT Additional Impairment(s): Fuctional Use of Upper Extremity OT Plan OT Intensity: Minimum of 1-2 x/day, 45 to 90 minutes OT Frequency: 5 out of 7 days OT Duration/Estimated Length of Stay: 10-12 days OT Treatment/Interventions: Balance/vestibular training;Discharge planning;Pain management;Self Care/advanced ADL retraining;Therapeutic Activities;UE/LE Coordination activities;Cognitive remediation/compensation;Disease mangement/prevention;Functional mobility training;Patient/family education;DME/adaptive equipment instruction;Neuromuscular re-education;Splinting/orthotics;UE/LE Strength taining/ROM;Wheelchair propulsion/positioning;Therapeutic Exercise OT Self Feeding Anticipated Outcome(s): Set up A OT Basic Self-Care Anticipated Outcome(s): Supervision OT Toileting Anticipated Outcome(s): Supervision OT Bathroom Transfers Anticipated Outcome(s): Supervision OT Recommendation Recommendations for Other Services: Therapeutic Recreation consult Therapeutic Recreation Interventions: Pet therapy Patient destination: Home Follow Up Recommendations: 24 hour supervision/assistance Equipment Recommended: To be determined   OT Evaluation Precautions/Restrictions  Precautions Precautions: Fall Precaution Comments: monitor HR & O2 Restrictions Weight Bearing Restrictions: No Home Living/Prior Functioning Home Living Family/patient expects to be discharged to:: Private residence Living Arrangements: Children, Other (Comment) Available Help at Discharge: Family, Available PRN/intermittently (daughter arranging 24/7 support) Type of Home: House Home Access: Stairs to enter [employer] of Steps: 1 Entrance Stairs-Rails: None Home Layout: Able to live on main  level with bedroom/bathroom Bathroom Shower/Tub: Chiropodist: Handicapped height Bathroom Accessibility: Yes Additional Comments: All prior history obtained from acute due to no family member present to confirm details and pt is a poor historian/cog deficits. Lives with daughter (works full time) and grand child (autistic)  Lives With: Daughter, Other (Comment) (granddaughter) Prior Function Level of Independence: Requires assistive device for independence, Needs assistance with ADLs Vision Baseline Vision/History: 1 Wears glasses (reports having glasses but currently doesnt have in room/not wearing) Ability to See in Adequate  Light: 1 Impaired Patient Visual Report: No change from baseline Vision Assessment?: Yes Eye Alignment: Within Functional Limits Ocular Range of Motion: Within Functional Limits Alignment/Gaze Preference: Within Defined Limits Tracking/Visual Pursuits: Able to track stimulus in all quads without difficulty Saccades: Within functional limits Convergence: Within functional limits Visual Fields: No apparent deficits Additional Comments: pt able to read parts of sentences on menu however not accurate and also indicated on several accounts "I'm blind without my glasses" Perception  Perception: Within Functional Limits Praxis Praxis: Intact Cognition Cognition Overall Cognitive Status: Impaired/Different from baseline Arousal/Alertness: Awake/alert Orientation Level: Person Person: Oriented Memory: Impaired Awareness: Impaired Problem Solving: Impaired Safety/Judgment: Appears intact Brief Interview for Mental Status (BIMS) Repetition of Three Words (First Attempt): 3 Temporal Orientation: Year: Missed by more than 5 years (1914) Temporal Orientation: Month: Missed by more than 1 month Temporal Orientation: Day: Incorrect Recall: "Sock": No, could not recall Recall: "Blue": Yes, no cue required Recall: "Bed": No, could not recall BIMS  Summary Score: 5 Sensation Sensation Light Touch: Impaired by gross assessment Hot/Cold: Not tested Proprioception: Appears Intact Stereognosis: Not tested Additional Comments: Reports numbness with diminished sensation in distal fingers Coordination Gross Motor Movements are Fluid and Coordinated: No Fine Motor Movements are Fluid and Coordinated: No Finger Nose Finger Test: impaired L >R due to history of CVA with L sided weakness Motor  Motor Motor: Hemiplegia  Trunk/Postural Assessment  Cervical Assessment Cervical Assessment: Within Functional Limits Thoracic Assessment Thoracic Assessment: Exceptions to [facility] (rounded shoulders) Lumbar Assessment Lumbar Assessment: Exceptions to [facility] (posterior pelvic tilt in sitting EOB) Postural Control Postural Control: Deficits on evaluation  Balance Balance Balance Assessed: Yes Static Sitting Balance Static Sitting - Balance Support: Feet supported;Bilateral upper extremity supported Static Sitting - Level of Assistance: 5: Stand by assistance (supervision) Dynamic Sitting Balance Dynamic Sitting - Balance Support: Feet supported;No upper extremity supported Dynamic Sitting - Level of Assistance: 5: Stand by assistance (CGA) Static Standing Balance Static Standing - Balance Support: During functional activity;Right upper extremity supported Static Standing - Level of Assistance: 5: Stand by assistance (CGA) Dynamic Standing Balance Dynamic Standing - Balance Support: During functional activity;Right upper extremity supported Dynamic Standing - Level of Assistance: 4: Min assist Extremity/Trunk Assessment RUE Assessment RUE Assessment: Exceptions to [facility] Active Range of Motion (AROM) Comments: WFL General Strength Comments: 4-/5 grossly LUE Assessment LUE Assessment: Exceptions to [facility] Passive Range of Motion (PROM) Comments: WFL distally, significant restrictions proximally with shoulder flexion limited to 45 degrees Active Range  of Motion (AROM) Comments: Distally WFL, limited proximally to less than 30 deg shoulder flexion General Strength Comments: 2-/5  Care Tool Care Tool Self Care Eating   Eating Assist Level: Supervision/Verbal cueing    Oral Care    Oral Care Assist Level: Supervision/Verbal cueing    Bathing   Body parts bathed by patient: Right arm;Left arm;Chest;Abdomen;Left upper leg;Face Body parts bathed by helper: Front perineal area;Buttocks;Right upper leg;Right lower leg;Left lower leg   Assist Level: Moderate Assistance - Patient 50 - 74%    Upper Body Dressing(including orthotics)   What is the patient wearing?: Pull over shirt   Assist Level: Minimal Assistance - Patient > 75%    Lower Body Dressing (excluding footwear)   What is the patient wearing?: Pants Assist for lower body dressing: Maximal Assistance - Patient 25 - 49%    Putting on/Taking off footwear   What is the patient wearing?: Non-skid slipper socks Assist for footwear: Dependent - Patient 0%  Care Tool Toileting Toileting activity   Assist for toileting: Maximal Assistance - Patient 25 - 49%     Care Tool Bed Mobility Roll left and right activity        Sit to lying activity        Lying to sitting on side of bed activity         Care Tool Transfers Sit to stand transfer   Sit to stand assist level: Minimal Assistance - Patient > 75%    Chair/bed transfer         Toilet transfer   Assist Level: Minimal Assistance - Patient > 75%     Care Tool Cognition  Expression of Ideas and Wants Expression of Ideas and Wants: 3. Some difficulty - exhibits some difficulty with expressing needs and ideas (e.g, some words or finishing thoughts) or speech is not clear  Understanding Verbal and Non-Verbal Content Understanding Verbal and Non-Verbal Content: 3. Usually understands - understands most conversations, but misses some part/intent of message. Requires cues at times to understand   Memory/Recall  Ability Memory/Recall Ability : Current season   Refer to Care Plan for Long Term Goals  SHORT TERM GOAL WEEK 1 OT Short Term Goal 1 (Week 1): Pt will complete 2/3 steps during toileting OT Short Term Goal 2 (Week 1): Pt will complete sit > stand with CGA using LRAD OT Short Term Goal 3 (Week 1): Pt will thread LB clothing with supervision using AE PRN  Recommendations for other services: Therapeutic Recreation  Pet therapy   Skilled Therapeutic Intervention Patient received upright in bed upon therapy arrival and agreeable to participate in OT evaluation. C/o headache, no number provided; nurse already provided meds at start of session. Education provided on OT purpose, therapy schedule, goals for therapy, and safety policy while in rehab. Patient demonstrates cognitive impairments (initiation, sequencing, problem solving, memory), along with L hemiplegia with limited ROM on LUE > LLE, balance, endurance and strength deficits resulting in difficulty completing BADL tasks without increased physical assist. Pt will benefit from skilled OT services to focus on mentioned deficits. See below for ADL and functional transfer performance. Pt remained on RA, however did desat to 88% with activity but rebounded with rest. HR up to 108 bpm with simple standing therefore additional rest breaks provided. Pt remained upright in bed at conclusion of session with bed alarm on and all needs met at end of session.  ADL ADL Eating: Supervision/safety Where Assessed-Eating: Bed level Grooming: Supervision/safety Where Assessed-Grooming: Edge of bed Upper Body Bathing: Supervision/safety Where Assessed-Upper Body Bathing: Edge of bed Lower Body Bathing: Moderate assistance Where Assessed-Lower Body Bathing: Edge of bed Upper Body Dressing: Minimal assistance Where Assessed-Upper Body Dressing: Edge of bed Lower Body Dressing: Maximal assistance Where Assessed-Lower Body Dressing: Edge of bed Toileting:  Maximal assistance Where Assessed-Toileting: Bedside Commode Toilet Transfer: Minimal assistance Toilet Transfer Method: Stand pivot Science writer: Radiographer, therapeutic: Unable to assess Tub/Shower Transfer Method: Unable to assess Social research officer, government: Unable to assess Social research officer, government Method: Unable to assess Mobility  Bed Mobility Bed Mobility: Supine to Sit Supine to Sit: Contact Guard/Touching assist Transfers Sit to Stand: Minimal Assistance - Patient > 75%   Discharge Criteria: Patient will be discharged from OT if patient refuses treatment 3 consecutive times without medical reason, if treatment goals not met, if there is a change in medical status, if patient makes no progress towards goals or if patient is discharged from hospital.  The  above assessment, treatment plan, treatment alternatives and goals were discussed and mutually agreed upon: by patient  Blase Mess, MS, OTR/L  02/14/2023, 12:33 PM

## 2023-02-14 NOTE — Plan of Care (Signed)
  Problem: RH Cognition - SLP Goal: RH LTG Patient will demonstrate orientation with cues Description:  LTG:  Patient will demonstrate orientation to person/place/time/situation with cues (SLP)   Flowsheets (Taken 02/14/2023 1554) LTG Patient will demonstrate orientation to:  Place  Time  Situation LTG: Patient will demonstrate orientation using cueing (SLP): Moderate Assistance - Patient 50 - 74%   Problem: RH Comprehension Communication Goal: LTG Patient will comprehend basic/complex auditory (SLP) Description: LTG: Patient will comprehend basic/complex auditory information with cues (SLP). Flowsheets (Taken 02/14/2023 1554) LTG: Patient will comprehend: Basic auditory information LTG: Patient will comprehend auditory information with cueing (SLP): Supervision   Problem: RH Memory Goal: LTG Patient will use memory compensatory aids to (SLP) Description: LTG:  Patient will use memory compensatory aids to recall biographical/new, daily complex information with cues (SLP) Flowsheets (Taken 02/14/2023 1554) LTG: Patient will use memory compensatory aids to (SLP): Moderate Assistance - Patient 50 - 74%   Problem: RH Attention Goal: LTG Patient will demonstrate this level of attention during functional activites (SLP) Description: LTG:  Patient will will demonstrate this level of attention during functional activites (SLP) Flowsheets (Taken 02/14/2023 1554) Patient will demonstrate during cognitive/linguistic activities the attention type of: Sustained Patient will demonstrate this level of attention during cognitive/linguistic activities in: Home LTG: Patient will demonstrate this level of attention during cognitive/linguistic activities with assistance of (SLP): Minimal Assistance - Patient > 75%

## 2023-02-14 NOTE — Evaluation (Addendum)
Speech Language Pathology Assessment and Plan  Patient Details  Name: Amanda Porter MRN: [id number] Date of Birth: [birth year]  SLP Diagnosis: Cognitive Impairments  Rehab Potential: Fair ELOS: 10-12 days    Today's Date: 02/14/2023 SLP Individual Time: [id number] SLP Individual Time Calculation (min): 16 min   Hospital Problem: Principal Problem:   SDH (subdural hematoma) ([place])  Past Medical History:  Past Medical History:  Diagnosis Date   Acute respiratory failure requiring reintubation ([place]) 11/05/2021   Anemia    Anxiety    Anxiety and depression    COPD (chronic obstructive pulmonary disease) ([facility])    CVA (cerebral vascular accident) ([place])    CVA (cerebral vascular accident) ([place])    L sided deficits   Dementia ([place])    Depression    Diabetes mellitus ([place])    Type 2   Diabetes mellitus without complication ([place])    Dysphagia    Dysphasia    Falls    GERD (gastroesophageal reflux disease)    HCAP (healthcare-associated pneumonia) 12/16/2022   History of lung cancer    Hyperlipidemia    Hypertension    Influenza A 12/13/2021   Memory disturbance 11/20/2017   Normal pressure hydrocephalus ([place]) 2018   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   Past Surgical History:  Past Surgical History:  Procedure Laterality Date   BURR HOLE Right 02/03/2023   Procedure: BURR HOLE X 1, ventriculoperitoneal shunt removal;  Surgeon: Meade Maw, MD;  Location: [facility] ORS;  Service: Neurosurgery;  Laterality: Right;   ESOPHAGOGASTRODUODENOSCOPY (EGD) WITH PROPOFOL N/A 12/16/2022   Procedure: ESOPHAGOGASTRODUODENOSCOPY (EGD) WITH PROPOFOL;  Surgeon: Lucilla Lame, MD;  Location: [facility] ENDOSCOPY;  Service: Endoscopy;  Laterality: N/A;   HIP ARTHROPLASTY Right  09/07/2022   Procedure: ARTHROPLASTY BIPOLAR HIP (HEMIARTHROPLASTY);  Surgeon: Lovell Sheehan, MD;  Location: [facility] ORS;  Service: Orthopedics;  Laterality: Right;   IR FL GUIDED LOC OF NEEDLE/CATH TIP FOR SPINAL INJECTION RT  07/25/2022   LUNG REMOVAL, PARTIAL     left upper lobe   SHUNT REVISION VENTRICULAR-PERITONEAL N/A 09/19/2022   Procedure: VENTRICULOPERITONEAL SHUNT REVISION;  Surgeon: Meade Maw, MD;  Location: [facility] OR[facility];  Service: Neurosurgery;  Laterality: N/A;   VENTRICULOPERITONEAL SHUNT  12/09/2016   VENTRICULOPERITONEAL SHUNT  10/2018    Assessment / Plan / Recommendation Clinical Impression Patient is a 77 year old right-handed female with history of COPD and lung cancer status post upper left lobectomy, former smoker, chronic anemia, anxiety/depression/dementia maintained on Namenda, CVA with left-sided deficits, diabetes mellitus type 2, hyperlipidemia, hypertension, tardive dyskinesia, recurrent UTIs,  right THA 09/07/2022 per Dr. Harlow Mares, right VP shunt replacement for NPH about 5 months ago. Presented to [facility] 02/03/2023 with increasing confusion and gait disorder. Cranial CT scan showed mixed density right cerebral convexity subdural hematoma significantly increased in size since 01/12/2023 currently measuring up to 2.4 cm with worsening mass effect and 1.3 cm rightward midline shift. Stable position of right frontal ventricular catheter without evidence of hydrocephalus or trapped ventricle. Patient underwent right frontal burr hole for drainage of subdural hematoma 02/03/2023 per Dr. [name] and VP shunt removed. Latest cranial CT scan 02/09/2023 shows decreased size of right subdural collection now 6 mm decreased right to left midline shift now 4 mm.  Palliative care consulted to establish goals of care. Therapy evaluations completed with recommendations for a comprehensive rehab program. Patient admitted 02/13/23.  Upon arrival, patient was awake while upright in bed. Patient  was oriented to name but disoriented to age, place and situation. Patient's daughter arrived at beginning of session and reported patient has had a steady decline in cognition over the last year but patient overall appears more confused since hospitalization. Due to severity of cognitive impairments, a formal assessment was not administered. During informal assessment of patient performing functional tasks (donning clothes, using BSC), patient was noted to have delayed processing speed and initiation and would easily become distracted requiring extra time and multiple repetitions of commands. Patient would initially report she needed assistance with a task but with encouragement could eventually complete task. During transitional movement, patient needed step by step instructions for sequencing. Patient responded best to statements vs questions in order to complete tasks (Lets take your dentures out so we don't lose them vs do you want to take out you dentures ?). Patient would benefit from skilled SLP intervention to maximize her cognitive functioning and overall functional independence in order to reduce caregiver burden.    Skilled Therapeutic Interventions          Administered a cognitive linguistic evaluation, please see above for details.   SLP Assessment  Patient will need skilled [facility] during CIR admission    Recommendations  Patient destination: Home Follow up Recommendations: 24 hour supervision/assistance;Home Health SLP Equipment Recommended: None recommended by SLP    SLP Frequency 1 to 3 out of 7 days   SLP Duration  SLP Intensity  SLP Treatment/Interventions 10-12 days  Minumum of 1-2 x/day, 30 to 90 minutes  Cognitive remediation/compensation;Cueing hierarchy;Environmental controls;Functional tasks;Internal/external aids;Patient/family education;Therapeutic Activities    Pain Pain Assessment Pain Scale: 0-10 Pain Score: 8  Pain Type: Acute  pain Pain Location: Head Pain Descriptors / Indicators: Headache Pain Intervention(s): Medication (See eMAR)  Prior Functioning Type of Home: House  Lives With: Daughter;Other (Comment) (granddaughter) Available Help at Discharge: Family;Available PRN/intermittently (daughter arranging 24/7 support)  SLP Evaluation Cognition Overall Cognitive Status: History of cognitive impairments - at baseline Arousal/Alertness: Awake/alert Orientation Level: Oriented to person Memory: Impaired Memory Impairment: Storage deficit;Decreased recall of new information;Decreased long term memory;Decreased short term memory Awareness: Impaired Awareness Impairment: Intellectual impairment Problem Solving: Impaired Problem Solving Impairment: Verbal basic;Functional basic Executive Function:  (all impaired due to lower level deficits) Safety/Judgment: Impaired  Comprehension Auditory Comprehension Overall Auditory Comprehension: Impaired Yes/No Questions: Impaired Conversation: Simple Interfering Components: Attention;Working memory;Processing speed EffectiveTechniques: Conservation officer, nature: Not tested Reading Comprehension Reading Status: Not tested Expression Expression Primary Mode of Expression: Verbal Verbal Expression Overall Verbal Expression: Appears within functional limits for tasks assessed Written Expression Dominant Hand: Right Written Expression: Not tested Oral Motor Oral Motor/Sensory Function Overall Oral Motor/Sensory Function: Within functional limits Motor Speech Overall Motor Speech: Appears within functional limits for tasks assessed  Care Tool Care Tool Cognition Ability to hear (with hearing aid or hearing appliances if normally used Ability to hear (with hearing aid or hearing appliances if normally used): 0. Adequate - no difficulty in normal conservation, social interaction, listening to TV    Expression of Ideas and Wants Expression of Ideas and Wants: 3. Some difficulty - exhibits some difficulty with expressing needs and ideas (e.g, some words or finishing thoughts) or speech is not clear   Understanding Verbal and Non-Verbal Content Understanding Verbal and Non-Verbal  Content: 3. Usually understands - understands most conversations, but misses some part/intent of message. Requires cues at times to understand  Memory/Recall Ability Memory/Recall Ability : Current season    Short Term Goals: Week 1: SLP Short Term Goal 1 (Week 1): Patient will utilie external aids to recall orientation information with Mod verbal and visual cues. SLP Short Term Goal 2 (Week 1): Patient will demonstrate sustained attention to functional tasks for 10 minutes with Mod verbal cues for redirection. SLP Short Term Goal 3 (Week 1): Patient will follow commands during functional tasks with Mod verbal and tactile cues in 90% of opportunities SLP Short Term Goal 4 (Week 1): Patient will initiate functional tasks with Mod verbal and tactile cues in 75% of opportunities.  Refer to Care Plan for Long Term Goals  Recommendations for other services: Neuropsych  Discharge Criteria: Patient will be discharged from SLP if patient refuses treatment 3 consecutive times without medical reason, if treatment goals not met, if there is a change in medical status, if patient makes no progress towards goals or if patient is discharged from hospital.  The above assessment, treatment plan, treatment alternatives and goals were discussed and mutually agreed upon: by patient and by family  Miana Politte 02/14/2023, 3:53 PM

## 2023-02-14 NOTE — Plan of Care (Signed)
  Problem: RH Balance Goal: LTG: Patient will maintain dynamic sitting balance (OT) Description: LTG:  Patient will maintain dynamic sitting balance with assistance during activities of daily living (OT) Flowsheets (Taken 02/14/2023 1234) LTG: Pt will maintain dynamic sitting balance during ADLs with: Independent with assistive device Goal: LTG Patient will maintain dynamic standing with ADLs (OT) Description: LTG:  Patient will maintain dynamic standing balance with assist during activities of daily living (OT)  Flowsheets (Taken 02/14/2023 1234) LTG: Pt will maintain dynamic standing balance during ADLs with: Supervision/Verbal cueing   Problem: Sit to Stand Goal: LTG:  Patient will perform sit to stand in prep for activites of daily living with assistance level (OT) Description: LTG:  Patient will perform sit to stand in prep for activites of daily living with assistance level (OT) Flowsheets (Taken 02/14/2023 1234) LTG: PT will perform sit to stand in prep for activites of daily living with assistance level: Supervision/Verbal cueing   Problem: RH Bathing Goal: LTG Patient will bathe all body parts with assist levels (OT) Description: LTG: Patient will bathe all body parts with assist levels (OT) Flowsheets (Taken 02/14/2023 1234) LTG: Pt will perform bathing with assistance level/cueing: Supervision/Verbal cueing   Problem: RH Dressing Goal: LTG Patient will perform upper body dressing (OT) Description: LTG Patient will perform upper body dressing with assist, with/without cues (OT). Flowsheets (Taken 02/14/2023 1234) LTG: Pt will perform upper body dressing with assistance level of: Supervision/Verbal cueing Goal: LTG Patient will perform lower body dressing w/assist (OT) Description: LTG: Patient will perform lower body dressing with assist, with/without cues in positioning using equipment (OT) Flowsheets (Taken 02/14/2023 1234) LTG: Pt will perform lower body dressing with assistance  level of: Supervision/Verbal cueing   Problem: RH Toileting Goal: LTG Patient will perform toileting task (3/3 steps) with assistance level (OT) Description: LTG: Patient will perform toileting task (3/3 steps) with assistance level (OT)  Flowsheets (Taken 02/14/2023 1234) LTG: Pt will perform toileting task (3/3 steps) with assistance level: Supervision/Verbal cueing   Problem: RH Functional Use of Upper Extremity Goal: LTG Patient will use RT/LT upper extremity as a (OT) Description: LTG: Patient will use right/left upper extremity as a stabilizer/gross assist/diminished/nondominant/dominant level with assist, with/without cues during functional activity (OT) Flowsheets (Taken 02/14/2023 1234) LTG: Use of upper extremity in functional activities: LUE as diminished level LTG: Pt will use upper extremity in functional activity with assistance level of: Supervision/Verbal cueing   Problem: RH Toilet Transfers Goal: LTG Patient will perform toilet transfers w/assist (OT) Description: LTG: Patient will perform toilet transfers with assist, with/without cues using equipment (OT) Flowsheets (Taken 02/14/2023 1234) LTG: Pt will perform toilet transfers with assistance level of: Supervision/Verbal cueing   Problem: RH Tub/Shower Transfers Goal: LTG Patient will perform tub/shower transfers w/assist (OT) Description: LTG: Patient will perform tub/shower transfers with assist, with/without cues using equipment (OT) Flowsheets (Taken 02/14/2023 1234) LTG: Pt will perform tub/shower stall transfers with assistance level of: Contact Guard/Touching assist   Problem: RH Memory Goal: LTG Patient will demonstrate ability for day to day recall/carry over during activities of daily living with assistance level (OT) Description: LTG:  Patient will demonstrate ability for day to day recall/carry over during activities of daily living with assistance level (OT). Flowsheets (Taken 02/14/2023 1234) LTG:  Patient  will demonstrate ability for day to day recall/carry over during activities of daily living with assistance level (OT): Minimal Assistance - Patient > 75%

## 2023-02-15 DIAGNOSIS — S065XAA Traumatic subdural hemorrhage with loss of consciousness status unknown, initial encounter: Secondary | ICD-10-CM | POA: Diagnosis not present

## 2023-02-15 LAB — GLUCOSE, CAPILLARY
Glucose-Capillary: 156 mg/dL — ABNORMAL HIGH (ref 70–99)
Glucose-Capillary: 196 mg/dL — ABNORMAL HIGH (ref 70–99)
Glucose-Capillary: 202 mg/dL — ABNORMAL HIGH (ref 70–99)
Glucose-Capillary: 80 mg/dL (ref 70–99)

## 2023-02-15 NOTE — Progress Notes (Signed)
PROGRESS NOTE   Subjective/Complaints:  No acute complaints.  No events overnight.  On exam, trying to get out of bed to get her dentures, which she says are downstairs. Easily redirected back to bed. Noted open wound on her scalp, she endorses itching her head.    ROS: Denies fevers, chills, N/V, abdominal pain, constipation, diarrhea, SOB, cough, chest pain, new weakness or paraesthesias.    Objective:   No results found. Recent Labs    02/13/23 1802  WBC 11.4*  HGB 8.4*  HCT 27.2*  PLT 446*    Recent Labs    02/13/23 1802  CREATININE 0.58     Intake/Output Summary (Last 24 hours) at 02/15/2023 2048 Last data filed at 02/15/2023 1900 Gross per 24 hour  Intake 240 ml  Output --  Net 240 ml         Physical Exam: Vital Signs Blood pressure 126/87, pulse 93, temperature 99.4 F (37.4 C), temperature source Oral, resp. rate 16, height 5\' 2"  (1.575 m), weight 37.2 kg, SpO2 100 %. Physical Exam:   Constitutional: No apparent distress. Appropriate appearance for age.  HENT: No JVD. Neck Supple. Trachea midline.  Eyes: PERRLA. EOMI. + mild L exotropia Cardiovascular: RRR, no murmurs/rub/gallops. No Edema. Peripheral pulses 2+  Respiratory: Mild L basilar crackles - stable.  +  [place], oxygen not running.  Abdomen: + bowel sounds, normoactive. No distention or tenderness.  GU: Not examined.   Skin: C/D/I. + erythema and excoriations on L posterior scalp.  + Hemicrani site mostly well healed; open area from scratching on anterior incision line MSK:      No apparent deformity.      Strength: 4/5 throughout LUE and LLE 5/5 throughout RUE and RLE   Neurologic exam:  Cognition: AAO to person; place with cues; not time -stable Language: Fluent, No substitutions or neoglisms. No dysarthria. Names 3/3 objects correctly  Memory: +deficits, severe; poor carryover Insight: Poor insight into current condition.  Mood:  Flat affect, appropriate mood.  Sensation: To light touch intact in BL UEs and LEs  Reflexes: 1+ in BL UE and LEs. Negative Hoffman's and babinski signs bilaterally.  CN:+ L facial droop Coordination: + ataxia on FTN L>R     Assessment/Plan: 1. Functional deficits which require 3+ hours per day of interdisciplinary therapy in a comprehensive inpatient rehab setting. Physiatrist is providing close team supervision and 24 hour management of active medical problems listed below. Physiatrist and rehab team continue to assess barriers to discharge/monitor patient progress toward functional and medical goals  Care Tool:  Bathing    Body parts bathed by patient: Right arm, Left arm, Chest, Abdomen, Left upper leg, Face   Body parts bathed by helper: Front perineal area, Buttocks, Right upper leg, Right lower leg, Left lower leg     Bathing assist Assist Level: Moderate Assistance - Patient 50 - 74%     Upper Body Dressing/Undressing Upper body dressing   What is the patient wearing?: Pull over shirt    Upper body assist Assist Level: Minimal Assistance - Patient > 75%    Lower Body Dressing/Undressing Lower body dressing      What is the  patient wearing?: Pants     Lower body assist Assist for lower body dressing: Maximal Assistance - Patient 25 - 49%     Toileting Toileting    Toileting assist Assist for toileting: Maximal Assistance - Patient 25 - 49%     Transfers Chair/bed transfer  Transfers assist     Chair/bed transfer assist level: Minimal Assistance - Patient > 75%     Locomotion Ambulation   Ambulation assist      Assist level: Minimal Assistance - Patient > 75% Assistive device: Walker-rolling Max distance: 20 ft   Walk 10 feet activity   Assist     Assist level: Minimal Assistance - Patient > 75% Assistive device: Walker-rolling   Walk 50 feet activity   Assist    Assist level: Minimal Assistance - Patient > 75% Assistive device:  Walker-rolling    Walk 150 feet activity   Assist Walk 150 feet activity did not occur: Safety/medical concerns         Walk 10 feet on uneven surface  activity   Assist Walk 10 feet on uneven surfaces activity did not occur: Safety/medical concerns   Assist level: Minimal Assistance - Patient > 75% Assistive device: Walker-rolling   Wheelchair     Assist Is the patient using a wheelchair?: Yes Type of Wheelchair: Manual    Wheelchair assist level: Total Assistance - Patient < 25% Max wheelchair distance: 231ft    Wheelchair 50 feet with 2 turns activity    Assist        Assist Level: Total Assistance - Patient < 25%   Wheelchair 150 feet activity     Assist      Assist Level: Total Assistance - Patient < 25%   Blood pressure 126/87, pulse 93, temperature 99.4 F (37.4 C), temperature source Oral, resp. rate 16, height 5\' 2"  (1.575 m), weight 37.2 kg, SpO2 100 %.  Medical Problem List and Plan: 1. Functional deficits secondary to worsening subdural hematoma status post right frontal burr hole 02/03/2023 and VP shunt removed with  history of recent right VP shunt replacement for NPH approximately 5 months ago followed by neurosurgery Dr. Meade Maw as well as history of CVA with left-sided residual weakness.             -patient may shower             -ELOS/Goals: 10-14 days, SPV PT/OT/SLP  2.  Antithrombotics: -DVT/anticoagulation:  Pharmaceutical: Lovenox initiated 02/05/2023             -antiplatelet therapy: Aspirin 81 mg daily 3. Pain Management: Tylenol as needed 4. Mood/Behavior/Sleep/anxiety/depression: Wellbutrin 300 mg daily, BuSpar 30 mg twice daily, Namenda 5 mg twice daily, melatonin 10 mg nightly             -antipsychotic agents: N/A 5. Neuropsych/cognition: This patient is not capable of making decisions on her own behalf. 6. Skin/Wound Care: Routine skin checks   - 3/16-17: Benadryll to scalp BID. Nursing order to keep open  areas covered to prevent further itching, excoriations and potential infection  -Benadryl cream to posterior head rash twice daily.  Patient has allergy to triple antibiotic ointment.  7. Fluids/Electrolytes/Nutrition: Routine in and outs with follow-up chemistries -Admission labs stable, monitor.  8.  Chronic COPD without exacerbation as well as history of lung cancer status post right upper lobe lobectomy, former smoker.  Supplemental oxygen as needed.  Continue inhalers as directed - 3/16: Discussed with family, patient on continuous oxygen 2 to  3 L at home, significant respiratory history with no cognitive deficits.  Daughter is concerned that as needed oxygen will confuse her, and she will get used to removing it, which will make it more difficult to manage at home and is something she is witnessed already today.  This is a reasonable consideration, replaced oxygen orders with continuous with goal sats 89 to 97%. - 3/17: Satting 90-100% on RA intermittently; had discussed with caught risks of high oxygen sat for chronic lung dz yesterday, may need to re-engage this week  9.  Hyperlipidemia.  Crestor 10.  Hypertension.  Currently on Norvasc 2.5 mg daily.  Blood pressure has been soft losartan currently on hold.  - well controlled; monitor    02/15/2023    7:23 PM 02/15/2023    2:15 PM 02/15/2023    5:34 AM  Vitals with BMI  Weight   82 lbs  BMI   15  Systolic [id number]  Diastolic 87 65 59  Pulse 93 99 96     11.  Diabetes mellitus.  Hemoglobin A1c 6.8.  SSI/Glucotrol 2.5 mg daily Recent Labs    02/15/23 0611 02/15/23 1205 02/15/23 1638  GLUCAP 156* 196* 202*     - 3/17: Mildly high but overall controlled; monitor   12.  Chronic anemia.  Continue iron supplement.  Follow-up CBC 13.  Recurrent UTIs.  Monitor for any dysuria or hematuria 14.  Right THA 09/07/2022.  Weightbearing as tolerated 15.  Nose colonized with MRSA.  Bactroban as indicated 16.  GERD.  Protonix     LOS: 2 days A FACE TO FACE EVALUATION WAS PERFORMED  Gertie Gowda 02/15/2023, 8:48 PM

## 2023-02-15 NOTE — Discharge Instructions (Addendum)
Inpatient Rehab Discharge Instructions  [place] Discharge date and time: No discharge date for patient encounter.   Activities/Precautions/ Functional Status: Activity: As tolerated Diet: Diabetic diet Wound Care: Routine skin checks Functional status:  ___ No restrictions     ___ Walk up steps independently ___ 24/7 supervision/assistance   ___ Walk up steps with assistance ___ Intermittent supervision/assistance  ___ Bathe/dress independently ___ Walk with walker     _x__ Bathe/dress with assistance ___ Walk Independently    ___ Shower independently ___ Walk with assistance    ___ Shower with assistance ___ No alcohol     ___ Return to work/school ________  Special Instructions: No driving smoking or alcohol  Continue Chronic Oxygen   My questions have been answered and I understand these instructions. I will adhere to these goals and the provided educational materials after my discharge from the hospital.  Patient/Caregiver Signature _______________________________ Date __________  Clinician Signature _______________________________________ Date __________  Please bring this form and your medication list with you to all your follow-up doctor's appointments.

## 2023-02-16 DIAGNOSIS — E119 Type 2 diabetes mellitus without complications: Secondary | ICD-10-CM

## 2023-02-16 DIAGNOSIS — I10 Essential (primary) hypertension: Secondary | ICD-10-CM

## 2023-02-16 DIAGNOSIS — D72829 Elevated white blood cell count, unspecified: Secondary | ICD-10-CM

## 2023-02-16 DIAGNOSIS — S065XAA Traumatic subdural hemorrhage with loss of consciousness status unknown, initial encounter: Secondary | ICD-10-CM | POA: Diagnosis not present

## 2023-02-16 DIAGNOSIS — D509 Iron deficiency anemia, unspecified: Secondary | ICD-10-CM

## 2023-02-16 LAB — GLUCOSE, CAPILLARY
Glucose-Capillary: 126 mg/dL — ABNORMAL HIGH (ref 70–99)
Glucose-Capillary: 193 mg/dL — ABNORMAL HIGH (ref 70–99)
Glucose-Capillary: 76 mg/dL (ref 70–99)
Glucose-Capillary: 109 mg/dL — ABNORMAL HIGH (ref 70–99)

## 2023-02-16 NOTE — Progress Notes (Signed)
Physical Therapy Session Note  Patient Details  Name: Amanda Porter MRN: [id number] Date of Birth: 02-01-1946  Today's Date: 02/16/2023 PT Individual Time: 1030-1100 PT Individual Time Calculation (min): 30 min   Short Term Goals: Week 1:  PT Short Term Goal 1 (Week 1): Pt will perform >114ft of gait w/ Min A using LRAD PT Short Term Goal 2 (Week 1): Pt will be CGA for transfers PT Short Term Goal 3 (Week 1): Pt will tolerate standing for >2 minutes  Skilled Therapeutic Interventions/Progress Updates:    Pt received sitting upright in WC and agreeable to PT services. Pt on 2L of SPO2 via Nasal canula upon entrance. Therapist explained to pt that the goal was to wean her off the O2. Pt verbalized understanding. Removed nasal canula and assessed O2 which read 98%.  Performed Sit<>stand w/ CGA and vc to use UE to push up from [facility]. Took ~2 minutes for pt to initiate movement. Pt performed ~30 ft of gait before requesting to sit down and rest. Pt performed gait w/ RW and Min A for AD management. Pt's motor planning an initiation are decreased.  Pt showing signs of SOB. SpO2 checked w/ pt in sitting read 86%. 1 L of Supplemental oxygen provided to pt via nasal canula. Pt ambulated ~63ft back to room and O2 checked again read 93%. Pt left w/ nasal canula on and 1L of SPO2 flowing. Pt left sitting in [facility] w/ chair alarm on, call bell in reach and all needs met.   Therapy Documentation Precautions:  Precautions Precautions: Fall Precaution Comments: monitor HR & O2, L sided weakness Restrictions Weight Bearing Restrictions: No General:  Pain:pt denies any pain       Therapy/Group: Individual Therapy  Adiel Erney 02/16/2023, 12:55 PM

## 2023-02-16 NOTE — Progress Notes (Signed)
Physical Therapy Session Note  Patient Details  Name: Amanda Porter MRN: [id number] Date of Birth: 10/23/1946  Today's Date: 02/16/2023 PT Individual Time: 1400-1500 PT Individual Time Calculation (min): 60 min   Short Term Goals: Week 1:  PT Short Term Goal 1 (Week 1): Pt will perform >139ft of gait w/ Min A using LRAD PT Short Term Goal 2 (Week 1): Pt will be CGA for transfers PT Short Term Goal 3 (Week 1): Pt will tolerate standing for >2 minutes  Skilled Therapeutic Interventions/Progress Updates:  Patient greeted sitting upright in wheelchair in room and agreeable to PT treatment session. Patient wheeled to rehab gym for time management and energy conservation.   Patient stood from wheelchair with RW and CGA for safety- VC for proper hand placement as patient attempts to stand with B hands on the walker. When transferring, patient at times required multimodal cues for locating her wheelchair on her L side, as well as problem-solving rolling walker management when turning.   Patient gait trained x80' and x113' with RW and CGA/SBA for safety- VC for pursed lip breathing, increased BOS and improved rolling walker management. Patient required frequent standing rest breaks throughout gait trials secondary to reports of fatigue and needing to "cath her breath."   At rest patient's SpO2 >97% while on 1 and 2L oxygen via [place], however with exertion patient desaturated below 84% on 1L so remained on 2L throughout gait trials and returned to 1L at rest.   Attempted to have patient performed sit/stands without UE support, however patient easily distracted in crowded gym and reported increased fatigue. Patient returned to her room where she performed sit/stand and stand pivot transfer without an AD and CGA for safety. Patient transitioned to supine with Supv and VC for repositioning and locating L UE as she was lying on her hand.   Patient left supine in bed with bed alarm on, call bell within reach  and all needs met. Therapist swapped her current wheelchair for a smaller wheelchair with a cushion for improved body alignment and comfort.    Therapy Documentation Precautions:  Precautions Precautions: Fall Precaution Comments: monitor HR & O2, L sided weakness Restrictions Weight Bearing Restrictions: No  Therapy/Group: Individual Therapy  Nimai Burbach 02/16/2023, 7:51 AM

## 2023-02-16 NOTE — Progress Notes (Signed)
PROGRESS NOTE   Subjective/Complaints:  Reports Ha/pain around scalp wound for several days- controlled with tylenol PRN.    ROS: Denies fevers, chills, malaise, N/V, abdominal pain, constipation, diarrhea, SOB, cough, chest pain, new weakness or paraesthesias.   +headache  Objective:   No results found. Recent Labs    02/13/23 1802  WBC 11.4*  HGB 8.4*  HCT 27.2*  PLT 446*    Recent Labs    02/13/23 1802  CREATININE 0.58     Intake/Output Summary (Last 24 hours) at 02/16/2023 1428 Last data filed at 02/16/2023 0700 Gross per 24 hour  Intake 238 ml  Output --  Net 238 ml         Physical Exam: Vital Signs Blood pressure (!) 114/55, pulse 99, temperature 97.8 F (36.6 C), temperature source Oral, resp. rate 19, height 5\' 2"  (1.575 m), weight 35.2 kg, SpO2 100 %. Physical Exam:   Constitutional: No apparent distress. Appropriate appearance for age. Lying in bed HENT: No JVD. Neck Supple. Trachea midline.  Eyes: PERRLA. EOMI. + mild L exotropia Cardiovascular: RRR, no murmurs/rub/gallops. No Edema. Peripheral pulses 2+  Respiratory: CTAB Abdomen: + bowel sounds, normoactive. No distention or tenderness.  GU: Not examined.   Skin: C/D/I. + erythema and excoriations on L posterior scalp.  + Hemicrani site mostly well healed; open area from scratching on anterior incision line MSK:      No apparent deformity.      Strength: 4/5 throughout LUE and LLE 5/5 throughout RUE and RLE   Neurologic exam:  Cognition: AAO to person; place with cues; not time -stable Language: Fluent, No substitutions or neoglisms. No dysarthria. Names 3/3 objects correctly  Memory: +deficits, severe; poor carryover Insight: Poor insight into current condition.  Mood: Flat affect, appropriate mood.  Sensation: To light touch intact in BL UEs and LEs  Reflexes: 1+ in BL UE and LEs. Negative Hoffman's and babinski signs  bilaterally.  CN:+ L facial droop Coordination: + ataxia on FTN L>R     Assessment/Plan: 1. Functional deficits which require 3+ hours per day of interdisciplinary therapy in a comprehensive inpatient rehab setting. Physiatrist is providing close team supervision and 24 hour management of active medical problems listed below. Physiatrist and rehab team continue to assess barriers to discharge/monitor patient progress toward functional and medical goals  Care Tool:  Bathing    Body parts bathed by patient: Right arm, Left arm, Chest, Abdomen, Left upper leg, Face   Body parts bathed by helper: Front perineal area, Buttocks, Right upper leg, Right lower leg, Left lower leg     Bathing assist Assist Level: Moderate Assistance - Patient 50 - 74%     Upper Body Dressing/Undressing Upper body dressing   What is the patient wearing?: Pull over shirt    Upper body assist Assist Level: Minimal Assistance - Patient > 75%    Lower Body Dressing/Undressing Lower body dressing      What is the patient wearing?: Pants     Lower body assist Assist for lower body dressing: Maximal Assistance - Patient 25 - 49%     Toileting Toileting    Toileting assist Assist for toileting: Maximal  Assistance - Patient 25 - 49%     Transfers Chair/bed transfer  Transfers assist     Chair/bed transfer assist level: Minimal Assistance - Patient > 75%     Locomotion Ambulation   Ambulation assist      Assist level: Minimal Assistance - Patient > 75% Assistive device: Walker-rolling Max distance: 20 ft   Walk 10 feet activity   Assist     Assist level: Minimal Assistance - Patient > 75% Assistive device: Walker-rolling   Walk 50 feet activity   Assist    Assist level: Minimal Assistance - Patient > 75% Assistive device: Walker-rolling    Walk 150 feet activity   Assist Walk 150 feet activity did not occur: Safety/medical concerns         Walk 10 feet on uneven  surface  activity   Assist     Assist level: Minimal Assistance - Patient > 75% Assistive device: Walker-rolling   Wheelchair     Assist Is the patient using a wheelchair?: Yes Type of Wheelchair: Manual    Wheelchair assist level: Total Assistance - Patient < 25% Max wheelchair distance: 242ft    Wheelchair 50 feet with 2 turns activity    Assist        Assist Level: Total Assistance - Patient < 25%   Wheelchair 150 feet activity     Assist      Assist Level: Total Assistance - Patient < 25%   Blood pressure (!) 114/55, pulse 99, temperature 97.8 F (36.6 C), temperature source Oral, resp. rate 19, height 5\' 2"  (1.575 m), weight 35.2 kg, SpO2 100 %.  Medical Problem List and Plan: 1. Functional deficits secondary to worsening subdural hematoma status post right frontal burr hole 02/03/2023 and VP shunt removed with  history of recent right VP shunt replacement for NPH approximately 5 months ago followed by neurosurgery Dr. Meade Maw as well as history of CVA with left-sided residual weakness.             -patient may shower             -ELOS/Goals: 10-14 days, SPV PT/OT/SLP  -Team conference tomorrow  2.  Antithrombotics: -DVT/anticoagulation:  Pharmaceutical: Lovenox initiated 02/05/2023             -antiplatelet therapy: Aspirin 81 mg daily 3. Pain Management: Tylenol as needed 4. Mood/Behavior/Sleep/anxiety/depression: Wellbutrin 300 mg daily, BuSpar 30 mg twice daily, Namenda 5 mg twice daily, melatonin 10 mg nightly             -antipsychotic agents: N/A 5. Neuropsych/cognition: This patient is not capable of making decisions on her own behalf. 6. Skin/Wound Care: Routine skin checks   - 3/16-17: Benadryll to scalp BID. Nursing order to keep open areas covered to prevent further itching, excoriations and potential infection  -Benadryl cream to posterior head rash twice daily.  Patient has allergy to triple antibiotic ointment.  7.  Fluids/Electrolytes/Nutrition: Routine in and outs with follow-up chemistries -Admission labs stable, monitor.  8.  Chronic COPD without exacerbation as well as history of lung cancer status post right upper lobe lobectomy, former smoker.  Supplemental oxygen as needed.  Continue inhalers as directed - 3/16: Discussed with family, patient on continuous oxygen 2 to 3 L at home, significant respiratory history with no cognitive deficits.  Daughter is concerned that as needed oxygen will confuse her, and she will get used to removing it, which will make it more difficult to manage at home and is  something she is witnessed already today.  This is a reasonable consideration, replaced oxygen orders with continuous with goal sats 89 to 97%. - 3/17: Satting 90-100% on RA intermittently; had discussed with caught risks of high oxygen sat for chronic lung dz yesterday, may need to re-engage this week  9.  Hyperlipidemia.  Crestor 10.  Hypertension.  Currently on Norvasc 2.5 mg daily.  Blood pressure has been soft losartan currently on hold.  - well controlled; monitor -3/18 well controlled, monitor    02/16/2023    1:57 PM 02/16/2023    5:28 AM 02/15/2023    7:23 PM  Vitals with BMI  Weight  77 lbs 10 oz   BMI  0000000   Systolic [id number]  Diastolic 55 54 87  Pulse 99 88 93     11.  Diabetes mellitus.  Hemoglobin A1c 6.8.  SSI/Glucotrol 2.5 mg daily Recent Labs    02/15/23 2106 02/16/23 0607 02/16/23 1124  GLUCAP 80 126* 193*     - 3/17: Mildly high but overall controlled; monitor  -3/18 overall controlled, continue to monitor   12.  Chronic anemia.  Continue iron supplement.  Follow-up CBC  -3/18 recheck ordered for tomorrow, does not appear it was completed today 13.  Recurrent UTIs.  Monitor for any dysuria or hematuria 14.  Right THA 09/07/2022.  Weightbearing as tolerated 15.  Nose colonized with MRSA.  Bactroban as indicated 16.  GERD.  Protonix 17. Leukocytosis, recheck CBC  tomorrow    LOS: 3 days A FACE TO FACE EVALUATION WAS PERFORMED  Jennye Boroughs 02/16/2023, 2:28 PM

## 2023-02-16 NOTE — IPOC Note (Signed)
Overall Plan of Care [facility]) Patient Details Name: Amanda Porter MRN: [id number] DOB: 06/13/1946  Admitting Diagnosis: SDH (subdural hematoma) [facility])  Hospital Problems: Principal Problem:   SDH (subdural hematoma) ([place])     Functional Problem List: Nursing Bladder, Bowel, Safety, Skin Integrity, Endurance, Medication Management, Motor, Pain  PT Balance, Safety, Sensory, Endurance, Motor  OT Balance, Cognition, Endurance, Motor, Pain, Sensory  SLP Cognition  TR         Basic ADL's: OT Grooming, Bathing, Dressing, Toileting     Advanced  ADL's: OT       Transfers: PT Bed Mobility, Bed to Chair, Teacher, early years/pre, Tub/Shower     Locomotion: PT Ambulation, Emergency planning/management officer, Stairs     Additional Impairments: OT Fuctional Use of Upper Extremity  SLP Social Cognition   Attention (orientation)  TR      Anticipated Outcomes Item Anticipated Outcome  Self Feeding Set up A  Swallowing      Basic self-care  Supervision  Insurance underwriter Transfers Supervision  Bowel/Bladder  continent B/B  Transfers  Mod I  Locomotion  Supervision Assist  Communication     Cognition  Mod A  Pain  less than 3  Safety/Judgment  remain fall free while in rehab   Therapy Plan: PT Intensity: Minimum of 1-2 x/day ,45 to 90 minutes PT Frequency: 5 out of 7 days PT Duration Estimated Length of Stay: 10-12 days OT Intensity: Minimum of 1-2 x/day, 45 to 90 minutes OT Frequency: 5 out of 7 days OT Duration/Estimated Length of Stay: 10-12 days SLP Intensity: Minumum of 1-2 x/day, 30 to 90 minutes SLP Frequency: 1 to 3 out of 7 days SLP Duration/Estimated Length of Stay: 10-12 days   Team Interventions: Nursing Interventions Patient/Family Education, Pain Management, Bladder Management, Medication Management, Discharge Planning, Bowel Management, Skin Care/Wound Management, Disease Management/Prevention  PT interventions Ambulation/gait training, Community  reintegration, DME/adaptive equipment instruction, Neuromuscular re-education, Psychosocial support, Stair training, UE/LE Strength taining/ROM, Wheelchair propulsion/positioning, Training and development officer, Discharge planning, Functional electrical stimulation, Pain management, Therapeutic Activities, UE/LE Coordination activities, Skin care/wound management, Cognitive remediation/compensation, Disease management/prevention, Functional mobility training, Patient/family education, Splinting/orthotics, Therapeutic Exercise, Visual/perceptual remediation/compensation  OT Interventions Balance/vestibular training, Discharge planning, Pain management, Self Care/advanced ADL retraining, Therapeutic Activities, UE/LE Coordination activities, Cognitive remediation/compensation, Disease mangement/prevention, Functional mobility training, Patient/family education, DME/adaptive equipment instruction, Neuromuscular re-education, Splinting/orthotics, UE/LE Strength taining/ROM, Wheelchair propulsion/positioning, Therapeutic Exercise  SLP Interventions Cognitive remediation/compensation, English as a second language teacher, Environmental controls, Functional tasks, Internal/external aids, Patient/family education, Therapeutic Activities  TR Interventions    SW/CM Interventions Discharge Planning, Psychosocial Support, Patient/Family Education   Barriers to Discharge MD  Medical stability  Nursing Decreased caregiver support, Home environment access/layout, Incontinence, Wound Care Attends adult day care; home B/B main level with 1 ste entry no rail  PT Inaccessible home environment, Decreased caregiver support, Home environment Child psychotherapist, Insurance for SNF coverage Pt's memory deficits and decreased motor control  OT Home environment access/layout, Lack of/limited family support    SLP      SW Decreased caregiver support, Lack of/limited family support, Insurance underwriter for SNF coverage Pt reports on home o2   Team Discharge  Planning: Destination: PT-Home ,OT- Home , SLP-Home Projected Follow-up: PT-Home health PT, OT-  24 hour supervision/assistance, SLP-24 hour supervision/assistance, Home Health SLP Projected Equipment Needs: PT-To be determined, OT- To be determined, SLP-None recommended by SLP Equipment Details: PT- , OT-  Patient/family involved in discharge planning: PT- Patient,  OT-Patient, SLP-Patient, Family member/caregiver  MD ELOS:  10-14 Medical Rehab Prognosis:  Excellent Assessment: The patient has been admitted for CIR therapies with the diagnosis of subdural hematoma . The team will be addressing functional mobility, strength, stamina, balance, safety, adaptive techniques and equipment, self-care, bowel and bladder mgt, patient and caregiver education. Goals have been set at [facility]. Anticipated discharge destination is home.        See Team Conference Notes for weekly updates to the plan of care

## 2023-02-16 NOTE — Progress Notes (Signed)
Occupational Therapy Session Note  Patient Details  Name: TORRI CARNLEY MRN: [id number] Date of Birth: 10-24-1946  Today's Date: 02/16/2023 OT Individual Time: 0800-0830 OT Individual Time Calculation (min): 30 min    Short Term Goals: Week 1:  OT Short Term Goal 1 (Week 1): Pt will complete 2/3 steps during toileting OT Short Term Goal 2 (Week 1): Pt will complete sit > stand with CGA using LRAD OT Short Term Goal 3 (Week 1): Pt will thread LB clothing with supervision using AE PRN  Skilled Therapeutic Interventions/Progress Updates:    Pt greeted supine in bed asleep. Pt had not eaten breakfast and would not keep her eyes open. OT returned after 10- minutes when nursing entered and we were able to wake patient to participate. Pt agreeable to sit EOB and get dressed. She declined bathing or need to go to the bathroom. Min A and increaesd time for bed mobility. Pt with some shortness of breath seated EOB, placed on 2L which she is on at home at baseline. Pt slow to initiate and slow to respond to commands. Overall max A for UB and LB dressing. Pt took meds seated EOB with supervision for balance. She then agreed to get up to wc. Min A sit<>stand and stand-pivot to wc. Pt left seated in wc with nursing present to finish administering meds. Pt also left on 2L of O2.   Therapy Documentation Precautions:  Precautions Precautions: Fall Precaution Comments: monitor HR & O2, L sided weakness Restrictions Weight Bearing Restrictions: No Pain: Pain Assessment Pain Scale: 0-10 Pain Score: 10-Worst pain ever Pain Type: Acute pain Pain Location: Head Pain Orientation: Right;Lateral Pain Descriptors / Indicators: Aching;Headache Pain Frequency: Constant Pain Onset: Progressive Patients Stated Pain Goal: 4 Pain Intervention(s): Medication (See eMAR) ]   Therapy/Group: Individual Therapy  Valma Cava 02/16/2023, 8:51 AM

## 2023-02-16 NOTE — Progress Notes (Signed)
Inpatient Rehabilitation Care Coordinator Assessment and Plan Patient Details  Name: Amanda Porter MRN: [id number] Date of Birth: [birth year]  Today's Date: 02/15/2077  Hospital Problems: Principal Problem:   SDH (subdural hematoma) [facility])  Past Medical History:  Past Medical History:  Diagnosis Date   Acute respiratory failure requiring reintubation ([place]) 11/05/2021   Anemia    Anxiety    Anxiety and depression    COPD (chronic obstructive pulmonary disease) ([place])    CVA (cerebral vascular accident) ([place])    CVA (cerebral vascular accident) ([place])    L sided deficits   Dementia ([place])    Depression    Diabetes mellitus ([place])    Type 2   Diabetes mellitus without complication ([place])    Dysphagia    Dysphasia    Falls    GERD (gastroesophageal reflux disease)    HCAP (healthcare-associated pneumonia) 12/16/2022   History of lung cancer    Hyperlipidemia    Hypertension    Influenza A 12/13/2021   Memory disturbance 11/20/2017   Normal pressure hydrocephalus ([place]) 2018   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   Past Surgical History:  Past Surgical History:  Procedure Laterality Date   BURR HOLE Right 02/03/2023   Procedure: BURR HOLE X 1, ventriculoperitoneal shunt removal;  Surgeon: Meade Maw, MD;  Location: [facility] ORS;  Service: Neurosurgery;  Laterality: Right;   ESOPHAGOGASTRODUODENOSCOPY (EGD) WITH PROPOFOL N/A 12/16/2022   Procedure: ESOPHAGOGASTRODUODENOSCOPY (EGD) WITH PROPOFOL;  Surgeon: Lucilla Lame, MD;  Location: [facility] ENDOSCOPY;  Service: Endoscopy;  Laterality: N/A;   HIP ARTHROPLASTY Right 09/07/2022   Procedure: ARTHROPLASTY BIPOLAR HIP (HEMIARTHROPLASTY);  Surgeon: Lovell Sheehan, MD;  Location: [facility] ORS;  Service: Orthopedics;   Laterality: Right;   IR FL GUIDED LOC OF NEEDLE/CATH TIP FOR SPINAL INJECTION RT  07/25/2022   LUNG REMOVAL, PARTIAL     left upper lobe   SHUNT REVISION VENTRICULAR-PERITONEAL N/A 09/19/2022   Procedure: VENTRICULOPERITONEAL SHUNT REVISION;  Surgeon: Meade Maw, MD;  Location: [facility] ORS;  Service: Neurosurgery;  Laterality: N/A;   VENTRICULOPERITONEAL SHUNT  12/09/2016   VENTRICULOPERITONEAL SHUNT  10/2018   Social History:  reports that she quit smoking about 5 years ago. Her smoking use included cigarettes. She has a 25.00 pack-year smoking history. She has been exposed to tobacco smoke. She has never used smokeless tobacco. She reports that she does not currently use alcohol. She reports that she does not currently use drugs.  Family / Support Systems Marital Status: Divorced How Long?: unknown Patient Roles: Parent Spouse/Significant Other: Divorced Children: DtrCaryl Pina Other Supports: None reported Anticipated Caregiver: various family memebrs Ability/Limitations of Caregiver: Pt dtr works fulltime.  Per EMR, Pt dtr indicated she will work on finding 24/7 care for patient. Caregiver Availability: 24/7 Family Dynamics: Pt lives with her dtr [name] and 1 yr old granddaughter  (autistic).  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 Education: [employer] - How often do you need to have someone help you when you read instructions, pamphlets, or other written material from your doctor or pharmacy?: Often Writes: Yes Employment Status: Retired Date Retired/Disabled/Unemployed: unknown Public relations account executive Issues: Pt denies Guardian/Conservator: Denies  Abuse/Neglect Abuse/Neglect Assessment Can Be Completed: Unable to assess, patient is non-responsive or altered mental status Physical Abuse: Denies Verbal Abuse: Denies Sexual  Abuse: Denies Exploitation of patient/patient's resources: Denies Self-Neglect: Denies  Patient response to: Social Isolation - How often do you feel lonely or isolated from those around you?: Never  Emotional Status Pt's affect, behavior and adjustment status: Pt in good spirits at time of visit. Pt not able to answer most complex questions, but was able to answer yes/no questions. Recent Psychosocial Issues: Denies Psychiatric History: Per EMR, hx of dementia Substance Abuse History: Pt reports she quit smoking cigarettes. Unsure on how long ago. Denies rec drugs and etoh use.  Patient / Family Perceptions, Expectations & Goals Pt/Family understanding of illness & functional limitations: Per EMR, pt family has a general understanding of pt care needs. Premorbid pt/family roles/activities: Assistance with some ADLs/IADLs (cognition, self-care, mobility) Anticipated changes in roles/activities/participation: Continued assistance required Pt/family expectations/goals: Pt reports she would like to work on her speech.  Community [employer] Agencies: None Premorbid Home Care/DME Agencies: Other (Comment) [facility] for HHPT/OT/SLP/RN) Transportation available at discharge: TBD Is the patient able to respond to transportation needs?: Yes In the past 12 months, has lack of transportation kept you from medical appointments or from getting medications?: No In the past 12 months, has lack of transportation kept you from meetings, work, or from getting things needed for daily living?: No Resource referrals recommended: Neuropsychology  Discharge Planning Living Arrangements: Children Support Systems: Children Type of Residence: Private residence Insurance Resources: Multimedia programmer (specify) ([employer] Medicare) Financial Resources: Fish farm manager, Family Support Financial Screen Referred: No Living Expenses: Lives with family Money Management: Family Does the patient have any  problems obtaining your medications?: No Home Management: Pt reports she helped prepare meals and helped with cleaning the home Patient/Family Preliminary Plans: TBD Care Coordinator Barriers to Discharge: Decreased caregiver support, Lack of/limited family support, Insurance for SNF coverage Care Coordinator Barriers to Discharge Comments: Pt reports on home o2 Care Coordinator Anticipated Follow Up Needs: HH/OP Expected length of stay: 10-12 days  Clinical Impression SW met with pt in room to introduce self, explain role, and discuss discharge process. SW completed a chart audit and spoke with pt to complete assessment as pt has hx of dementia. Pt is not a English as a second language teacher. No HCPOA. Pt reports she has home o2. Pt is aware SW will be speaking with her dtr to coordinate her care needs.   Kavita Bartl A Shareta Fishbaugh 02/16/2023, 2:41 PM

## 2023-02-16 NOTE — Care Management (Signed)
[place] Individual Statement of Services  Patient Name:  [name]  Date:  02/16/2023  Welcome to the [facility].  Our goal is to provide you with an individualized program based on your diagnosis and situation, designed to meet your specific needs.  With this comprehensive rehabilitation program, you will be expected to participate in at least 3 hours of rehabilitation therapies Monday-Friday, with modified therapy programming on the weekends.  Your rehabilitation program will include the following services:  Physical Therapy (PT), Occupational Therapy (OT), Speech Therapy (ST), 24 hour per day rehabilitation nursing, Therapeutic Recreaction (TR), Psychology, Neuropsychology, Care Coordinator, Rehabilitation Medicine, [place], and Other  Weekly team conferences will be held on Tuesdays to discuss your progress.  Your Inpatient Rehabilitation Care Coordinator will talk with you frequently to get your input and to update you on team discussions.  Team conferences with you and your family in attendance may also be held.  Expected length of stay: 10-12 days    Overall anticipated outcome: Supervision  Depending on your progress and recovery, your program may change. Your Inpatient Rehabilitation Care Coordinator will coordinate services and will keep you informed of any changes. Your Inpatient Rehabilitation Care Coordinator's name and contact numbers are listed  below.  The following services may also be recommended but are not provided by the [facility] will be made to provide these services after discharge if needed.  Arrangements include referral to agencies that provide these services.  Your insurance has been verified to be:  [employer]  Your primary  doctor is:  Alma Friendly  Pertinent information will be shared with your doctor and your insurance company.  Inpatient Rehabilitation Care Coordinator:  Cathleen Corti [phone] or [phone]  Information discussed with and copy given to patient by: Rana Snare, 02/16/2023, 9:33 AM

## 2023-02-16 NOTE — Progress Notes (Signed)
Inpatient Rehabilitation  Patient information reviewed and entered into eRehab system by Dmari Schubring M. Katherin Ramey, M.A., CCC/SLP, PPS Coordinator.  Information including medical coding, functional ability and quality indicators will be reviewed and updated through discharge.    

## 2023-02-16 NOTE — Progress Notes (Signed)
Patient ID: Amanda [facility], female   DOB: 08-Dec-1945, 77 y.o.   MRN: [id number]  1023-SW left message for pt dtr [name] to introduce self, explain role, discuss discharge process, and inform on ELOS. SW shared will follow-up with updates after team conference tomorrow.   Loralee Pacas, MSW, [place] Office: [phone] Cell: [phone] Fax: [phone]

## 2023-02-16 NOTE — Progress Notes (Signed)
Speech Language Pathology Daily Session Note  Patient Details  Name: Amanda Porter MRN: [id number] Date of Birth: 1945/12/10  Today's Date: 02/16/2023 SLP Individual Time: 1130-1225 SLP Individual Time Calculation (min): 55 min  Short Term Goals: Week 1: SLP Short Term Goal 1 (Week 1): Patient will utilie external aids to recall orientation information with Mod verbal and visual cues. SLP Short Term Goal 2 (Week 1): Patient will demonstrate sustained attention to functional tasks for 10 minutes with Mod verbal cues for redirection. SLP Short Term Goal 3 (Week 1): Patient will follow commands during functional tasks with Mod verbal and tactile cues in 90% of opportunities SLP Short Term Goal 4 (Week 1): Patient will initiate functional tasks with Mod verbal and tactile cues in 75% of opportunities.  Skilled Therapeutic Interventions:   Pt seen for skilled SLP session to address functional cognitive goals. Verbally reviewed orientation information. Pt oriented to self, DOB, and DOW only. Disoriented to date, year, situation, and place. Wrote general orientation information in memory book for pt to reference. Pt completed visual safety awareness task with moderate-max verbal cues to identify safety hazards in pictured scene. Pt benefits from additional processing time and frequent repetition or restatement of questions. Pt left sitting up in w/c with chair alarm set. Call bell in reach. Continue SLP PoC.    Pain Pain Assessment Pain Scale: 0-10 Pain Score: 0-No pain Pain Type: Acute pain Pain Location: Head Pain Orientation: Right;Lateral Pain Descriptors / Indicators: Aching;Headache Pain Frequency: Constant Pain Onset: Progressive Patients Stated Pain Goal: 4  Therapy/Group: Individual Therapy  Wyn Forster 02/16/2023, 1:00 PM

## 2023-02-17 ENCOUNTER — Inpatient Hospital Stay (HOSPITAL_COMMUNITY): Payer: Medicare PPO

## 2023-02-17 DIAGNOSIS — S065XAA Traumatic subdural hemorrhage with loss of consciousness status unknown, initial encounter: Secondary | ICD-10-CM | POA: Diagnosis not present

## 2023-02-17 LAB — BASIC METABOLIC PANEL
Anion gap: 13 (ref 5–15)
BUN: 24 mg/dL — ABNORMAL HIGH (ref 8–23)
CO2: 24 mmol/L (ref 22–32)
Calcium: 8.9 mg/dL (ref 8.9–10.3)
Chloride: 102 mmol/L (ref 98–111)
Creatinine, Ser: 0.63 mg/dL (ref 0.44–1.00)
GFR, Estimated: >60 mL/min (ref >60)
Glucose, Bld: 147 mg/dL — ABNORMAL HIGH (ref 70–99)
Potassium: 3.5 mmol/L (ref 3.5–5.1)
Sodium: 139 mmol/L (ref 135–145)

## 2023-02-17 LAB — GLUCOSE, CAPILLARY
Glucose-Capillary: 148 mg/dL — ABNORMAL HIGH (ref 70–99)
Glucose-Capillary: 104 mg/dL — ABNORMAL HIGH (ref 70–99)
Glucose-Capillary: 238 mg/dL — ABNORMAL HIGH (ref 70–99)
Glucose-Capillary: 133 mg/dL — ABNORMAL HIGH (ref 70–99)

## 2023-02-17 LAB — CBC
HCT: 27.7 % — ABNORMAL LOW (ref 36.0–46.0)
Hemoglobin: 8.3 g/dL — ABNORMAL LOW (ref 12.0–15.0)
MCH: 26.2 pg (ref 26.0–34.0)
MCHC: 30 g/dL (ref 30.0–36.0)
MCV: 87.4 fL (ref 80.0–100.0)
Platelets: 515 10*3/uL — ABNORMAL HIGH (ref 150–400)
RBC: 3.17 MIL/uL — ABNORMAL LOW (ref 3.87–5.11)
RDW: 15.9 % — ABNORMAL HIGH (ref 11.5–15.5)
WBC: 7.8 10*3/uL (ref 4.0–10.5)
nRBC: 0 % (ref 0.0–0.2)

## 2023-02-17 NOTE — Patient Care Conference (Signed)
Inpatient RehabilitationTeam Conference and Plan of Care Update Date: 02/17/2023   Time: 10:44 AM    Patient Name: Amanda Porter      Medical Record Number: [id number]  Date of Birth: Oct 06, 1946 Sex: Female         Room/Bed: 4W20C/4W20C-01 Payor Info: Payor: [employer] MEDICARE / Plan: [employer] MEDICARE CHOICE PPO / Product Type: *No Product type* /    Admit Date/Time:  02/13/2023  3:07 PM  Primary Diagnosis:  SDH (subdural hematoma) [facility])  Hospital Problems: Principal Problem:   SDH (subdural hematoma) [facility])    Expected Discharge Date: Expected Discharge Date: 02/23/23  Team Members Present: Physician leading conference: Dr. Durel Salts Social Worker Present: Loralee Pacas, [place] Nurse Present: Tacy Learn, RN PT Present: Terence Lux, PT OT Present: Cherylynn Ridges, OT SLP Present: Weston Anna, SLP PPS Coordinator present : Gunnar Fusi, SLP     Current Status/Progress Goal Weekly Team Focus  Bowel/Bladder   pt incontinent of b/b; LBM: 3/18   offer toileting when awake   assist with toileting needs prn    Swallow/Nutrition/ Hydration               ADL's   CGA for transfers/sit<>stands, Min/mod A for BADLs with cues for initiation, sequencing and attention   Supervision overall, min A cognition   self-care retraining, activity tolerance, general strengthening, participation    Mobility   Supv for bed mobility; CGA for sit/stands, transfers and gait up to 113' with RW- Limited by endurance/activity tolerance as patient remains on 2L oxygen with exertion, but 1L at rest.   ModI/Supv with LRAD  Dynamic stability, global strengthening, endurance/activity tolerance, L-sided awareness/attention, family education, discharge planning, gait, stair mobility, etc.    Communication                Safety/Cognition/ Behavioral Observations  Mod-Max A   Min A   orientation, initiation, attention    Pain   no c/o pain   remain pain free   assess pain QS and  prn    Skin   lami incision OTA; x scratches on head, scheduled ointment   remain free of new skin breakdown/infection  assess skin QS and prn      Discharge Planning:  Per pt, will return home. EMR indicates pt daughter works full time and is working on 24/7 care. SW will confirm there are no  barriers to discharge.   Team Discussion: SDH. Incontinent of urine. Continent of stool. Headache pain managed with PRN medications. Incision to right side of head has attached edges without drainage. Blisters to right side of face are improving. Daily weights. Blood sugars are stable. Uses O2 at home. Continue O2 and titrate as needed. Has refused therapy a few times. Making 15/7.  CGA for STS and transfers. Min/Mod A for BADLs with cues for initiation, sequencing and attention.  Gait up to 113' with RW limited by poor endurance and activity tolerance. O2 at 2L with exertion and 1L at rest. Mod/Max A with orientation, initiation and attention.  Patient on target to meet rehab goals: yes  *See Care Plan and progress notes for long and short-term goals.   Revisions to Treatment Plan:  Medication adjustments , oxygen management, wound care for rash/excoriations to back of head and neck Teaching Needs: Medications, safety, self care, gait/transfer training, skin care, etc.   Current Barriers to Discharge: Decreased caregiver support, Incontinence, and Wound care  Possible Resolutions to Barriers: Family education, nursing education, order recommended DME  Medical Summary Current Status: medically complicated by chronic COPD, SDH s/p burr hole with cognitive deficits, rash on posterior head and neck with excoriations, CHF, DM, and debility  Barriers to Discharge: Incontinence;Behavior/Mood;Complicated Wound;Cardiac Complications;Self-care education  Barriers to Discharge Comments: cognitive deficits/impulsivity and poor carryover, COPD, urinary incontinence, wound care Possible Resolutions to  [employer] Focus: treating rash, preacuations and re-orientation for cognitive deficits, COPD with oxygen titration, timed toiletting   Continued Need for Acute Rehabilitation Level of Care: The patient requires daily medical management by a physician with specialized training in physical medicine and rehabilitation for the following reasons: Direction of a multidisciplinary physical rehabilitation program to maximize functional independence : Yes Medical management of patient stability for increased activity during participation in an intensive rehabilitation regime.: Yes Analysis of laboratory values and/or radiology reports with any subsequent need for medication adjustment and/or medical intervention. : Yes   I attest that I was present, lead the team conference, and concur with the assessment and plan of the team.   Ernest Pine 02/17/2023, 2:46 PM

## 2023-02-17 NOTE — Progress Notes (Addendum)
Speech Language Pathology Daily Session Note  Patient Details  Name: Amanda Porter MRN: [id number] Date of Birth: October 27, 1946  Today's Date: 02/17/2023 SLP Individual Time: 1330-1355 SLP Individual Time Calculation (min): 25 min  Short Term Goals: Week 1: SLP Short Term Goal 1 (Week 1): Patient will utilie external aids to recall orientation information with Mod verbal and visual cues. SLP Short Term Goal 2 (Week 1): Patient will demonstrate sustained attention to functional tasks for 10 minutes with Mod verbal cues for redirection. SLP Short Term Goal 3 (Week 1): Patient will follow commands during functional tasks with Mod verbal and tactile cues in 90% of opportunities SLP Short Term Goal 4 (Week 1): Patient will initiate functional tasks with Mod verbal and tactile cues in 75% of opportunities.  Skilled Therapeutic Interventions: Skilled treatment session focused on cognitive goals. Upon arrival, patient was awake while supine in bed. Patient was initially disoriented X 3. Therefore, SLP provided patient with simplified external aids that she was able to utilize effectively with overall Min verbal cues. Patient was able to recall the information appropriately after an ~ 2 minute delay. Patient mildly perseverative on waiting for her daughter to arrive as well as wanting to go home. SLP provided encouragement and general education regarding the importance of participating in treatment sessions to maximize strength and overall progress. Patient verbalized understanding but will need reinforcement. Patient left upright in bed with alarm on and all needs within reach. Continue with current plan of care.      Pain No/Denies Pain     Therapy/Group: Individual Therapy  Shrihaan Porzio 02/17/2023, 2:53 PM

## 2023-02-17 NOTE — Progress Notes (Addendum)
PROGRESS NOTE   Subjective/Complaints:  On exam, patient is itching her left scalp, endorses ongoing issues and pain.  Otherwise, no concerns, complaints.  Breathing well, sleeping well, denies any pain at this time.   ROS: Denies fevers, chills, malaise, N/V, abdominal pain, constipation, diarrhea, SOB, cough, chest pain, new weakness or paraesthesias.   +headache  Objective:   No results found. Recent Labs    02/17/23 0537  WBC 7.8  HGB 8.3*  HCT 27.7*  PLT 515*    Recent Labs    02/17/23 0537  NA 139  K 3.5  CL 102  CO2 24  GLUCOSE 147*  BUN 24*  CREATININE 0.63  CALCIUM 8.9     Intake/Output Summary (Last 24 hours) at 02/17/2023 1347 Last data filed at 02/17/2023 0900 Gross per 24 hour  Intake 238 ml  Output --  Net 238 ml         Physical Exam: Vital Signs Blood pressure 136/66, pulse 86, temperature 98.7 F (37.1 C), temperature source Oral, resp. rate 16, height 5\' 2"  (1.575 m), weight 35.2 kg, SpO2 96 %. Physical Exam:   Constitutional: No apparent distress. Appropriate appearance for age. Lying in bed HENT: No JVD. Neck Supple. Trachea midline.  Eyes: PERRLA. EOMI. + mild L exotropia Cardiovascular: RRR, no murmurs/rub/gallops. No Edema. Peripheral pulses 2+  Respiratory: CTAB, decreased breath sounds in bilateral lower lobes.+ Nasal cannula with 2 L. Abdomen: + bowel sounds, normoactive. No distention or tenderness.  GU: Not examined.   Skin: C/D/I. + erythema and excoriations on L posterior scalp.  + Hemicrani site mostly well healed; open area from scratching on anterior incision line -now slightly raised, not erythematous, no fluctuance, no warmth or expressible drainage.  MSK:      No apparent deformity.      Strength: Slight left-sided weakness compared to right, otherwise antigravity and against resistance in all 4 extremities.   Neurologic exam:  Cognition: AAO to person; place  with cues; not time -stable Language: Fluent, No substitutions or neoglisms. No dysarthria. Names 3/3 objects correctly  Memory: +deficits, severe; poor carryover Insight: Poor insight into current condition.  Mood: Flat affect, appropriate mood.  Sensation: To light touch intact in BL UEs and LEs  Reflexes: 1+ in BL UE and LEs. Negative Hoffman's and babinski signs bilaterally.  CN:+ L facial droop Coordination: + ataxia on FTN L>R     Assessment/Plan: 1. Functional deficits which require 3+ hours per day of interdisciplinary therapy in a comprehensive inpatient rehab setting. Physiatrist is providing close team supervision and 24 hour management of active medical problems listed below. Physiatrist and rehab team continue to assess barriers to discharge/monitor patient progress toward functional and medical goals  Care Tool:  Bathing    Body parts bathed by patient: Right arm, Left arm, Chest, Abdomen, Left upper leg, Face   Body parts bathed by helper: Front perineal area, Buttocks, Right upper leg, Right lower leg, Left lower leg     Bathing assist Assist Level: Moderate Assistance - Patient 50 - 74%     Upper Body Dressing/Undressing Upper body dressing   What is the patient wearing?: Pull over shirt  Upper body assist Assist Level: Minimal Assistance - Patient > 75%    Lower Body Dressing/Undressing Lower body dressing      What is the patient wearing?: Pants     Lower body assist Assist for lower body dressing: Maximal Assistance - Patient 25 - 49%     Toileting Toileting    Toileting assist Assist for toileting: Maximal Assistance - Patient 25 - 49%     Transfers Chair/bed transfer  Transfers assist     Chair/bed transfer assist level: Minimal Assistance - Patient > 75%     Locomotion Ambulation   Ambulation assist      Assist level: Minimal Assistance - Patient > 75% Assistive device: Walker-rolling Max distance: 20 ft   Walk 10 feet  activity   Assist     Assist level: Minimal Assistance - Patient > 75% Assistive device: Walker-rolling   Walk 50 feet activity   Assist    Assist level: Minimal Assistance - Patient > 75% Assistive device: Walker-rolling    Walk 150 feet activity   Assist Walk 150 feet activity did not occur: Safety/medical concerns         Walk 10 feet on uneven surface  activity   Assist     Assist level: Minimal Assistance - Patient > 75% Assistive device: Walker-rolling   Wheelchair     Assist Is the patient using a wheelchair?: Yes Type of Wheelchair: Manual    Wheelchair assist level: Total Assistance - Patient < 25% Max wheelchair distance: 237ft    Wheelchair 50 feet with 2 turns activity    Assist        Assist Level: Total Assistance - Patient < 25%   Wheelchair 150 feet activity     Assist      Assist Level: Total Assistance - Patient < 25%   Blood pressure 136/66, pulse 86, temperature 98.7 F (37.1 C), temperature source Oral, resp. rate 16, height 5\' 2"  (1.575 m), weight 35.2 kg, SpO2 96 %.  Medical Problem List and Plan: 1. Functional deficits secondary to nontraumatic brain injury with SDH status post right frontal burr hole 02/03/2023  - Other pertinent Hx: VP shunt removed, replacement for NPH approximately 5 months ago followed by neurosurgery Dr. Meade Maw; Hx CVA with left-sided residual weakness.             -patient may shower             -ELOS/Goals: 10-14 days, SPV PT/OT/SLP - goal DC 3/15  -Placed 15/7 due to poor therapy tolerance  2.  Antithrombotics: -DVT/anticoagulation:  Pharmaceutical: Lovenox initiated 02/05/2023             -antiplatelet therapy: Aspirin 81 mg daily 3. Pain Management: Tylenol as needed 4. Mood/Behavior/Sleep/anxiety/depression: Wellbutrin 300 mg daily, BuSpar 30 mg twice daily, Namenda 5 mg twice daily, melatonin 10 mg nightly             -antipsychotic agents: N/A 5. Neuropsych/cognition:  This patient is not capable of making decisions on her own behalf. 6. Skin/Wound Care: Routine skin checks   - 3/16-17: Benadryll to scalp BID. Nursing order to keep open areas covered to prevent further itching, excoriations and potential infection   - 3/19: Reinforced to nursing need to cover excoriated areas, open area appears inflamed but not currently infected.  Patient was very poor cognitive carryover, unlikely to follow commands to stop itching surgical site.  -Benadryl cream to posterior head rash twice daily.  Patient has allergy to  triple antibiotic ointment.  7. Fluids/Electrolytes/Nutrition: Routine in and outs with follow-up chemistries -Admission labs stable, monitor.  8.  Chronic COPD without exacerbation as well as history of lung cancer status post right upper lobe lobectomy, former smoker.  Supplemental oxygen as needed.  Continue inhalers as directed - 3/16: Discussed with family, patient on continuous oxygen 2 to 3 L at home, significant respiratory history with no cognitive deficits.  Daughter is concerned that as needed oxygen will confuse her, and she will get used to removing it, which will make it more difficult to manage at home and is something she is witnessed already today.  This is a reasonable consideration, replaced oxygen orders with continuous with goal sats 89 to 97%. - 3/17: Satting 90-100% on RA intermittently; had discussed with caught risks of high oxygen sat for chronic lung dz yesterday, may need to re-engage this week - 3/19: Discussed keeping her oxygen on her and adjusting rate with therapies, nursing.  9.  Hyperlipidemia.  Crestor 10.  Hypertension.  Currently on Norvasc 2.5 mg daily.  Blood pressure has been soft losartan currently on hold.  - well controlled; monitor -3/18 well controlled, monitor    02/17/2023    8:52 AM 02/17/2023    7:47 AM 02/17/2023    6:12 AM  Vitals with BMI  Weight   77 lbs 10 oz  BMI   0000000  Systolic [id number]    Diastolic  66    Pulse  86      11.  Diabetes mellitus.  Hemoglobin A1c 6.8.  SSI/Glucotrol 2.5 mg daily Recent Labs    02/16/23 2132 02/17/23 0551 02/17/23 1121  GLUCAP 109* 148* 104*     - 3/17: Mildly high but overall controlled; monitor  -3/18 overall controlled, continue to monitor   12.  Chronic anemia.  Continue iron supplement.  Follow-up CBC  -3/18 recheck ordered for tomorrow, does not appear it was completed today   - 319 -hemoglobin 8.3, stable, monitor 13.  Recurrent UTIs.  Monitor for any dysuria or hematuria 14.  Right THA 09/07/2022.  Weightbearing as tolerated 15.  Nose colonized with MRSA.  Bactroban as indicated 16.  GERD.  Protonix 17. Leukocytosis -resolved 3/19    LOS: 4 days A FACE TO [place] 02/17/2023, 1:47 PM

## 2023-02-17 NOTE — Progress Notes (Signed)
Patient ID: Amanda Porter, female   DOB: 1946-11-02, 77 y.o.   MRN: [id number]  SW met with pt in room to provide updates from team conference, and d/c date 3/25. Pt aware SW will follow-up with her daughter.   1737-SW spoke with pt dtr [name] to inform on above. She reported concerns to ask if pt can be checked for a UTI  based on her being more confused and incontinence is a sign she has a UTI as she often retains. She also questioned pt length of stay while here, and wanted to know if an extension for 12 days vs 10 days as she is concerned pt may not be ready, and also reports concerns about her mother not using her left arm. SW informed will share with medical team. She inquired about if she does not agree with d/c. SW explained appeals process and possibility of being charged for hospitalization if appeal is denied. When discussing family edu she requested a phone call instead due to her being the only caregiver and unable to bring in her 77 yr old dtr. SW encouraged her to check her mother's schedule this evening, to see if she is able to come in during scheduled therapy times.   SW shared her concerns with medical team.   Loralee Pacas, MSW, [place] Office: [phone] Cell: [phone] Fax: [phone]

## 2023-02-17 NOTE — Progress Notes (Signed)
Occupational Therapy Note  Patient Details  Name: Amanda Porter MRN: [id number] Date of Birth: [birth year]  Today's Date: 02/17/2023 OT Missed Time: 75 Minutes Missed Time Reason: Patient fatigue;Other (comment)   Attempted to see patient for scheduled therapy session 2x this morning. Pt declined to get OOB or participate in any BADL tasks. OT provided multiple opportunities and options for participation, but she adamantly declined. OT to follow up in the afternoon.   [name] Amanda Porter 02/17/2023, 3:46 PM

## 2023-02-17 NOTE — Progress Notes (Signed)
Occupational Therapy Note  Patient Details  Name: ANALISSA HIRZEL MRN: [id number] Date of Birth: 15-Sep-1946  Today's Date: 02/17/2023 OT Missed Time: 64 Minutes Missed Time Reason: Patient fatigue  OT attempted to  see patient for afternoon session and for makeup time from this morning. Patient greeted sitting upright in bed awake after lunch. Patient continued to decline to participate despite max encouragement, therapeutic use of self, and multiple options given for ways to participate. OT will follow up per plan of care.    [name] Jarryn Altland 02/17/2023, 3:47 PM

## 2023-02-17 NOTE — Progress Notes (Signed)
Physical Therapy Session Note  Patient Details  Name: Amanda Porter MRN: [id number] Date of Birth: [birth year]  Today's Date: 02/17/2023 PT Individual Time: 1415-1500 PT Individual Time Calculation (min): 45 min   Short Term Goals: Week 1:  PT Short Term Goal 1 (Week 1): Pt will perform >145ft of gait w/ Min A using LRAD PT Short Term Goal 2 (Week 1): Pt will be CGA for transfers PT Short Term Goal 3 (Week 1): Pt will tolerate standing for >2 minutes  Skilled Therapeutic Interventions/Progress Updates:    Chart reviewed and pt agreeable to therapy. Pt received semi-reclined in bed with no c/o pain. Also of note, pt reported strong fatigue and slight nausea with hunger. Pt agreeable to start session with light snack at EOB. Pt transferred to EOB with MinA and required sup while sitting EOB for snack. Pt then agreeable to gait train in room. Pt gait trained for 18ft x3 with CGA + RW. Pt c/o fatigue and SOB that recovered with <1 min rest each time. Pt then completed marching in place for 3x10 with CGA + RW. Pt then reported strong fatigue and desire to rest. Pt verbalized understanding that PT time would need to be made up. Pt agreeable to completed seated marching and LAQ for 3x10 before return to bed with MinA. At end of session, pt was left semi-reclined in bed with alarm engaged, nurse call bell and all needs in reach.     Therapy Documentation Precautions:  Precautions Precautions: Fall Precaution Comments: monitor HR & O2, L sided weakness Restrictions Weight Bearing Restrictions: No General: PT Amount of Missed Time (min): 30 Minutes PT Missed Treatment Reason: Patient fatigue     Therapy/Group: Individual Therapy  Marquette Old, PT, DPT 02/17/2023, 3:17 PM

## 2023-02-17 NOTE — Progress Notes (Signed)
Occupational Therapy Note  Patient Details  Name: Amanda Porter MRN: [id number] Date of Birth: [birth year]  Patient's plan of care adjusted to 15/7 after speaking with care team and discussed with MD in team conference as patient currently unable to tolerate current therapy schedule with OT, PT, and SLP.   [name] Kaylei Frink 02/17/2023, 10:53 AM

## 2023-02-18 ENCOUNTER — Ambulatory Visit: Admit: 2023-02-18 | Payer: Medicare PPO

## 2023-02-18 DIAGNOSIS — S065XAA Traumatic subdural hemorrhage with loss of consciousness status unknown, initial encounter: Secondary | ICD-10-CM | POA: Diagnosis not present

## 2023-02-18 LAB — URINALYSIS, ROUTINE W REFLEX MICROSCOPIC
Bilirubin Urine: NEGATIVE
Glucose, UA: NEGATIVE mg/dL
Hgb urine dipstick: NEGATIVE
Ketones, ur: NEGATIVE mg/dL
Nitrite: POSITIVE — AB
Protein, ur: NEGATIVE mg/dL
Specific Gravity, Urine: 1.025 (ref 1.005–1.030)
pH: 5 (ref 5.0–8.0)

## 2023-02-18 LAB — GLUCOSE, CAPILLARY
Glucose-Capillary: 146 mg/dL — ABNORMAL HIGH (ref 70–99)
Glucose-Capillary: 186 mg/dL — ABNORMAL HIGH (ref 70–99)
Glucose-Capillary: 155 mg/dL — ABNORMAL HIGH (ref 70–99)
Glucose-Capillary: 104 mg/dL — ABNORMAL HIGH (ref 70–99)

## 2023-02-18 NOTE — Progress Notes (Signed)
Speech Language Pathology Daily Session Note  Patient Details  Name: Amanda Porter MRN: [id number] Date of Birth: 1946-01-05  Today's Date: 02/18/2023 SLP Individual Time: 0940-1000 SLP Individual Time Calculation (min): 20 min  Short Term Goals: Week 1: SLP Short Term Goal 1 (Week 1): Patient will utilie external aids to recall orientation information with Mod verbal and visual cues. SLP Short Term Goal 2 (Week 1): Patient will demonstrate sustained attention to functional tasks for 10 minutes with Mod verbal cues for redirection. SLP Short Term Goal 3 (Week 1): Patient will follow commands during functional tasks with Mod verbal and tactile cues in 90% of opportunities SLP Short Term Goal 4 (Week 1): Patient will initiate functional tasks with Mod verbal and tactile cues in 75% of opportunities.  Skilled Therapeutic Interventions: Skilled treatment session focused on cognitive goals. Upon arrival, patient was awake while upright in the wheelchair. Patient rubbing her head and reported a mild headache but appropriately recalled that she had already received pain medications. Patient was independently oriented to place and city and provided general information regarding reason for hospitalization (needed a check up). Patient disoriented to time but utilized the calendar with Min verbal cues. Patient also identified problems and possible consequences with overall Min verbal cues when given picture cards with patient independently utilizing her LUE to hold cards. Throughout task, patient required Mod verbal cues for redirection to task due to being distracted by hunger. Of note, patient consuming a snack of graham crackers upon arrival and unable to verbalize any other snack she may want at this time. Patient also appropriately identified the nurse call bell upon questioning if she required assistance for a task. Patient left upright in wheelchair with alarm on and all needs within reach. Continue with  current plan of care.    Pain Pain Assessment Pain Scale: 0-10 Pain Score: 5 Pain Type: Acute pain Pain Location: Head Pain Orientation: Right;Lateral Pain Descriptors / Indicators: Headache Pain Frequency: Constant Pain Onset: On-going Patients Stated Pain Goal: 3 Pain Intervention(s): patient premedicated, distraction  Therapy/Group: Individual Therapy  Amanda Porter, [place] 02/18/2023, 10:13 AM

## 2023-02-18 NOTE — Progress Notes (Signed)
Physical Therapy Session Note  Patient Details  Name: Amanda Porter MRN: [id number] Date of Birth: 06/14/1946  Today's Date: 02/18/2023 PT Individual Time: 1st Treatment Session: [phone]; 2nd Treatment Session: [id number] PT Individual Time Calculation (min): 45 min; 30 min  Short Term Goals: Week 1:  PT Short Term Goal 1 (Week 1): Pt will perform >110ft of gait w/ Min A using LRAD PT Short Term Goal 2 (Week 1): Pt will be CGA for transfers PT Short Term Goal 3 (Week 1): Pt will tolerate standing for >2 minutes  Skilled Therapeutic Interventions/Progress Updates:  1st Treatment Session-  Patient greeted semi-reclined in bed and finishing up breakfast. Patient initially not agreeable to PT treatment session, however with encouragement and increased time patient was agreeable to participate in session. Patient transitioned from semi-reclined to sitting EOB with use of bed rail and supv. While sitting EOB, patient donned pants with MinA for threading R LE. Therapist donned shoes with total assist for time management. Patient attempted to put her clean shirt on top of her dirty shirt and required frequent re-direction- Patient was able to doff her shirt with supervision and don clean shirt with set-up assist. Patient stood from EOB with RW and SBA- Increased time required to complete and properly motor plan with VC for improved hand placement. While standing, therapist pulled pants over hips for time management. Patient returned to a seated position and received morning medications from RN. Patient then stood from EOB with RW and SBA and performed stand pivot transfer to wheelchair with VC for sequencing and staying within the RW throughout transfer. Patient wheeled to rehab gym for time management and energy conservation. Therapist managed oxygen tank and line throughout treatment session with patient on 2L oxygen via [place].   Patient performed x4 sit/stands and stand pivot transfers to/from wheelchair  and rehab mat with RW and SBA- Massed practice performed in order to improve overall independence with functional mobility. Patient required VC for locating mat table and wheelchair despite looking at it prior to task, as well as following one step commands and maintaining attention to task.   Patient returned to her room and left sitting upright in wheelchair with posey belt on, call bell within reach and all needs met. Patient denied pain throughout treatment session. Patient returned to 2L oxygen via [place] at the wall.    2nd Treatment Session-  Patient greeted sitting upright in wheelchair in room and agreeable to PT treatment with encouragement. Patient wheeled to rehab gym for time management and energy conservation. Therapist managed oxygen tank and line throughout treatment session with patient on 2L oxygen via [place].   Patient stood from wheelchair multiple times with the use of a RW and SBA- VC for proper hand placement prior to standing and sitting with poor carryover noted.   Patient gait trained 2 x 180' with RW and SBA for safety- VC for path-finding, maintaining attention to task, obstacle negotiation and stepping within the frame of the walker. Patient required a seated rest break in between each gait trial.   Patient frequently requesting a snack and each time provided with options, however asking for pizza, chips or popcorn- Patient reminded lunch will be on it's way soon, however this was not a satisfactory answer for the patient. Patient left with graham crackers in her room as a snack prior to arrival of lunch.   Patient returned to her room and left sitting upright in wheelchair with posey belt on, call bell within reach and  all needs met. Patient returned to 2L oxygen via [place] at the wall.    Therapy Documentation Precautions:  Precautions Precautions: Fall Precaution Comments: monitor HR & O2, L sided weakness Restrictions Weight Bearing Restrictions: No   Therapy/Group:  Individual Therapy  Legrande Hao 02/18/2023, 7:48 AM

## 2023-02-18 NOTE — Progress Notes (Signed)
PROGRESS NOTE   Subjective/Complaints:  On exam, no acute complaints.  No events overnight.  Patient states itching on her scalp has improved somewhat.  Denies any pain with range of motion in her left upper extremity.  No shortness of breath, does have mild dry cough, states that is chronic.  Tolerating oxygen.   ROS: +itching scalp - imporvedDenies fevers, chills, malaise, N/V, abdominal pain, constipation, diarrhea, SOB, cough, chest pain, new weakness or paraesthesias.    Objective:   CT HEAD WO CONTRAST (5MM)  Result Date: 02/17/2023 CLINICAL DATA:  Follow-up examination for subdural hematoma. EXAM: CT HEAD WITHOUT CONTRAST TECHNIQUE: Contiguous axial images were obtained from the base of the skull through the vertex without intravenous contrast. RADIATION DOSE REDUCTION: This exam was performed according to the departmental dose-optimization program which includes automated exposure control, adjustment of the mA and/or kV according to patient size and/or use of iterative reconstruction technique. COMPARISON:  Prior study from 02/09/2023. FINDINGS: Brain: Previously identified right-sided subdural collection has decreased in size from previous, now measuring no more than 4 mm in maximal thickness. No hyperdense small amount of residual hyperdense blood products, similar to prior. No significant mass effect or midline shift. No other new acute intracranial hemorrhage. No acute large vessel territory infarct. Age-related cerebral atrophy with advanced chronic microvascular ischemic disease. Associated chronic lacunar infarcts noted about the thalami. Ventriculomegaly, stable from prior. Vascular: No abnormal hyperdense vessel. Scattered vascular calcifications noted within the carotid siphons. Skull: Prior right frontal burr hole craniotomy. Calvarium otherwise intact. Scalp soft tissues demonstrate no acute finding. Sinuses/Orbits: Right gaze  preference noted. Paranasal sinuses are clear. Trace left mastoid effusion noted, of doubtful significance. Other: None. IMPRESSION: 1. Interval decrease in size of right-sided subdural collection, now measuring no more than 4 mm in maximal thickness. No significant mass effect or midline shift. 2. No other acute intracranial abnormality. 3. Ventriculomegaly, stable from prior. 4. Underlying atrophy with chronic microvascular ischemic disease. Electronically Signed   By: Jeannine Boga M.D.   On: 02/17/2023 20:27   Recent Labs    02/17/23 0537  WBC 7.8  HGB 8.3*  HCT 27.7*  PLT 515*    Recent Labs    02/17/23 0537  NA 139  K 3.5  CL 102  CO2 24  GLUCOSE 147*  BUN 24*  CREATININE 0.63  CALCIUM 8.9     Intake/Output Summary (Last 24 hours) at 02/18/2023 1020 Last data filed at 02/17/2023 1848 Gross per 24 hour  Intake 120 ml  Output --  Net 120 ml         Physical Exam: Vital Signs Blood pressure 129/66, pulse 88, temperature 98.8 F (37.1 C), temperature source Oral, resp. rate 16, height 5\' 2"  (1.575 m), weight 32.1 kg, SpO2 100 %. Physical Exam:   Constitutional: No apparent distress. Appropriate appearance for age. Lying in bed HENT: No JVD. Neck Supple. Trachea midline.  Eyes: PERRLA. EOMI. + mild L exotropia Cardiovascular: RRR, no murmurs/rub/gallops. No Edema. Peripheral pulses 2+  Respiratory: CTAB, decreased breath sounds in bilateral lower lobes.+ Nasal cannula with 2 L. Abdomen: + bowel sounds, normoactive. No distention or tenderness.  GU: Not examined.  Skin: C/D/I. + erythema and excoriations on L posterior scalp + Hemicrani site mostly well healed; open area from scratching on anterior incision line -now slightly raised, not erythematous, no fluctuance, no warmth or expressible drainage. -covered in Mepilex, appears stable  MSK:      No apparent deformity.      Strength: Slight left-sided weakness compared to right, otherwise antigravity and  against resistance in all 4 extremities.   Neurologic exam:  Cognition: AAO to person; place with cues; not time -stable Language: Fluent, No substitutions or neoglisms. No dysarthria. Names 3/3 objects correctly  Memory: +deficits, severe; poor carryover Insight: Poor insight into current condition.  Mood: Flat affect, appropriate mood.  Sensation: To light touch intact in BL UEs and LEs  Reflexes: 1+ in BL UE and LEs. Negative Hoffman's and babinski signs bilaterally.  CN:+ L facial droop Coordination: + ataxia on FTN L>R.  Does grip and mobilize on command with left upper extremity, with some encouragement. Tone: MAS 3 left elbow flexors, otherwise minimal in left upper extremity, absent left lower extremity.    Assessment/Plan: 1. Functional deficits which require 3+ hours per day of interdisciplinary therapy in a comprehensive inpatient rehab setting. Physiatrist is providing close team supervision and 24 hour management of active medical problems listed below. Physiatrist and rehab team continue to assess barriers to discharge/monitor patient progress toward functional and medical goals  Care Tool:  Bathing    Body parts bathed by patient: Right arm, Left arm, Chest, Abdomen, Left upper leg, Face   Body parts bathed by helper: Front perineal area, Buttocks, Right upper leg, Right lower leg, Left lower leg     Bathing assist Assist Level: Moderate Assistance - Patient 50 - 74%     Upper Body Dressing/Undressing Upper body dressing   What is the patient wearing?: Pull over shirt    Upper body assist Assist Level: Minimal Assistance - Patient > 75%    Lower Body Dressing/Undressing Lower body dressing      What is the patient wearing?: Pants     Lower body assist Assist for lower body dressing: Maximal Assistance - Patient 25 - 49%     Toileting Toileting    Toileting assist Assist for toileting: Maximal Assistance - Patient 25 - 49%     Transfers Chair/bed  transfer  Transfers assist     Chair/bed transfer assist level: Minimal Assistance - Patient > 75%     Locomotion Ambulation   Ambulation assist      Assist level: Minimal Assistance - Patient > 75% Assistive device: Walker-rolling Max distance: 20 ft   Walk 10 feet activity   Assist     Assist level: Minimal Assistance - Patient > 75% Assistive device: Walker-rolling   Walk 50 feet activity   Assist    Assist level: Minimal Assistance - Patient > 75% Assistive device: Walker-rolling    Walk 150 feet activity   Assist Walk 150 feet activity did not occur: Safety/medical concerns         Walk 10 feet on uneven surface  activity   Assist     Assist level: Minimal Assistance - Patient > 75% Assistive device: Walker-rolling   Wheelchair     Assist Is the patient using a wheelchair?: Yes Type of Wheelchair: Manual    Wheelchair assist level: Total Assistance - Patient < 25% Max wheelchair distance: 266ft    Wheelchair 50 feet with 2 turns activity    Assist  Assist Level: Total Assistance - Patient < 25%   Wheelchair 150 feet activity     Assist      Assist Level: Total Assistance - Patient < 25%   Blood pressure 129/66, pulse 88, temperature 98.8 F (37.1 C), temperature source Oral, resp. rate 16, height 5\' 2"  (1.575 m), weight 32.1 kg, SpO2 100 %.  Medical Problem List and Plan: 1. Functional deficits secondary to nontraumatic brain injury with SDH status post right frontal burr hole 02/03/2023  - Other pertinent Hx: VP shunt removed, replacement for NPH approximately 5 months ago followed by neurosurgery Dr. Meade Maw; Hx CVA with left-sided residual weakness.             -patient may shower             -ELOS/Goals: 10-14 days, SPV PT/OT/SLP - goal DC 3/15  -Placed 15/7 due to poor therapy tolerance    2.  Antithrombotics: -DVT/anticoagulation:  Pharmaceutical: Lovenox initiated 02/05/2023              -antiplatelet therapy: Aspirin 81 mg daily 3. Pain Management: Tylenol as needed  4. Mood/Behavior/Sleep/anxiety/depression: Wellbutrin 300 mg daily, BuSpar 30 mg twice daily, Namenda 5 mg twice daily, melatonin 10 mg nightly             -antipsychotic agents: N/A 5. Neuropsych/cognition: This patient is not capable of making decisions on her own behalf. 6. Skin/Wound Care: Routine skin checks   - 3/16-17: Benadryll to scalp BID. Nursing order to keep open areas covered to prevent further itching, excoriations and potential infection   - 3/19: Reinforced to nursing need to cover excoriated areas, open area appears inflamed but not currently infected.  Patient was very poor cognitive carryover, unlikely to follow commands to stop itching surgical site - improved appearance 3/20  -Benadryl cream to posterior head rash twice daily.  Patient has allergy to triple antibiotic ointment.  7. Fluids/Electrolytes/Nutrition: Routine in and outs with follow-up chemistries -Admission labs stable, monitor.  8.  Chronic COPD without exacerbation as well as history of lung cancer status post right upper lobe lobectomy, former smoker.  Supplemental oxygen as needed.  Continue inhalers as directed - 3/16: Discussed with family, patient on continuous oxygen 2 to 3 L at home, significant respiratory history with no cognitive deficits.  Daughter is concerned that as needed oxygen will confuse her, and she will get used to removing it, which will make it more difficult to manage at home and is something she is witnessed already today.  This is a reasonable consideration, replaced oxygen orders with continuous with goal sats 89 to 97%. - 3/17: Satting 90-100% on RA intermittently; had discussed with caught risks of high oxygen sat for chronic lung dz yesterday, may need to re-engage this week - 3/19: Discussed keeping her oxygen on her and adjusting rate with therapies, nursing.  9.  Hyperlipidemia.  Crestor 10.   Hypertension.  Currently on Norvasc 2.5 mg daily.  Blood pressure has been soft losartan currently on hold.  - well controlled; monitor -3/18 well controlled, monitor    02/18/2023    7:36 AM 02/18/2023    4:58 AM 02/17/2023    7:15 PM  Vitals with BMI  Weight  70 lbs 12 oz   BMI  [id number]   Systolic  [id number]  Diastolic  66 69  Pulse 88 90 98     11.  Diabetes mellitus.  Hemoglobin A1c 6.8.  SSI/Glucotrol 2.5 mg daily Recent Labs  02/17/23 1636 02/17/23 2115 02/18/23 0634  GLUCAP 238* 133* 146*     - 3/17: Mildly high but overall controlled; monitor  -3/18 overall controlled, continue to monitor   - 3/20: Some intermittent elevated blood glucose into 200, however intermittent lows as well, last A1c 6.8, would not further adjust Glipizide  12.  Chronic anemia.  Continue iron supplement.  Follow-up CBC  -3/18 recheck ordered for tomorrow, does not appear it was completed today   - 319 -hemoglobin 8.3, stable, monitor 13.  Recurrent UTIs.  Monitor for any dysuria or hematuria  - 3/20: Daughter feels patient is increasingly confused, states this is a common symptom of UTI for her, will check urinalysis today.  14.  Right THA 09/07/2022.  Weightbearing as tolerated 15.  Nose colonized with MRSA.  Bactroban as indicated 16.  GERD.  Protonix 17. Leukocytosis -resolved 3/19  18.  Left-sided hemiparesis, from prior stroke, with spasticity at the left elbow.  Does not cause pain, patient able to functionally attend to the left side, and feel that antispasticity medications would affect her cognition more than would benefit.  Can discuss bracing, but will monitor for now.  LOS: 5 days A FACE TO FACE EVALUATION WAS PERFORMED  Gertie Gowda 02/18/2023, 10:20 AM

## 2023-02-18 NOTE — Progress Notes (Signed)
Patient ID: Amanda Porter, female   DOB: 26-Mar-1946, 77 y.o.   MRN: [id number]  Martin Majestic to patient after lunch and requested that her dentures go into the denture cup per patient's daughter's request, patient refused. Stated they were her dentures and she did not care what her daughter requested.

## 2023-02-18 NOTE — Progress Notes (Signed)
Met with patient. She reports that going home on Thursday. Let her know that discharge date is the 25th and that's this Monday. Patient also reports that going home with sister. Went over educational binder but she reports oh yes after every question. Informed to take home and put all paperwork in binder for day of discharge. All needs met. Call bell in reach.

## 2023-02-18 NOTE — Progress Notes (Signed)
Occupational Therapy Session Note  Patient Details  Name: Amanda Porter MRN: [id number] Date of Birth: 1946-01-11  Today's Date: 02/18/2023 OT Missed Time: 95 Minutes Missed Time Reason: Patient unwilling/refused to participate without medical reason  Pt sitting in the w/c with no c/o pain. She adamantly refused any participation in OT. Offered out of room activity, in room activity, ADLs, functional mobility. Pt declined ALL with no reason other than "I'm just not doing it right now".  45 min missed.   Curtis Sites 02/18/2023, 6:26 AM

## 2023-02-19 ENCOUNTER — Ambulatory Visit: Payer: Self-pay | Admitting: Neurosurgery

## 2023-02-19 DIAGNOSIS — S065XAA Traumatic subdural hemorrhage with loss of consciousness status unknown, initial encounter: Secondary | ICD-10-CM | POA: Diagnosis not present

## 2023-02-19 LAB — GLUCOSE, CAPILLARY
Glucose-Capillary: 129 mg/dL — ABNORMAL HIGH (ref 70–99)
Glucose-Capillary: 211 mg/dL — ABNORMAL HIGH (ref 70–99)
Glucose-Capillary: 145 mg/dL — ABNORMAL HIGH (ref 70–99)
Glucose-Capillary: 111 mg/dL — ABNORMAL HIGH (ref 70–99)

## 2023-02-19 MED ORDER — BACLOFEN 5 MG HALF TABLET
5.0000 mg | ORAL_TABLET | Freq: Every day | ORAL | Status: DC | PRN
  Filled 2023-02-19: qty 1

## 2023-02-19 MED ORDER — CEPHALEXIN 250 MG PO CAPS
500.0000 mg | ORAL_CAPSULE | Freq: Two times a day (BID) | ORAL | Status: DC
  Administered 2023-02-19 – 2023-02-23 (×9): 500 mg via ORAL
  Filled 2023-02-19 (×9): qty 2

## 2023-02-19 NOTE — Progress Notes (Signed)
PROGRESS NOTE   Subjective/Complaints:  On exam, no acute complaints.  No events overnight.  Denies any dysuria, abdominal pain, fevers, chills, or nausea. Per PT cognition appears to be consistent with prior exams, and she is attending well to use the L arm functionally. No c/o pain at L elbow.    ROS: +itching scalp - improved; Denies fevers, chills, malaise, N/V, abdominal pain, constipation, diarrhea, SOB, cough, chest pain, new weakness or paraesthesias.    Objective:   CT HEAD WO CONTRAST (5MM)  Result Date: 02/17/2023 CLINICAL DATA:  Follow-up examination for subdural hematoma. EXAM: CT HEAD WITHOUT CONTRAST TECHNIQUE: Contiguous axial images were obtained from the base of the skull through the vertex without intravenous contrast. RADIATION DOSE REDUCTION: This exam was performed according to the departmental dose-optimization program which includes automated exposure control, adjustment of the mA and/or kV according to patient size and/or use of iterative reconstruction technique. COMPARISON:  Prior study from 02/09/2023. FINDINGS: Brain: Previously identified right-sided subdural collection has decreased in size from previous, now measuring no more than 4 mm in maximal thickness. No hyperdense small amount of residual hyperdense blood products, similar to prior. No significant mass effect or midline shift. No other new acute intracranial hemorrhage. No acute large vessel territory infarct. Age-related cerebral atrophy with advanced chronic microvascular ischemic disease. Associated chronic lacunar infarcts noted about the thalami. Ventriculomegaly, stable from prior. Vascular: No abnormal hyperdense vessel. Scattered vascular calcifications noted within the carotid siphons. Skull: Prior right frontal burr hole craniotomy. Calvarium otherwise intact. Scalp soft tissues demonstrate no acute finding. Sinuses/Orbits: Right gaze preference  noted. Paranasal sinuses are clear. Trace left mastoid effusion noted, of doubtful significance. Other: None. IMPRESSION: 1. Interval decrease in size of right-sided subdural collection, now measuring no more than 4 mm in maximal thickness. No significant mass effect or midline shift. 2. No other acute intracranial abnormality. 3. Ventriculomegaly, stable from prior. 4. Underlying atrophy with chronic microvascular ischemic disease. Electronically Signed   By: Jeannine Boga M.D.   On: 02/17/2023 20:27   Recent Labs    02/17/23 0537  WBC 7.8  HGB 8.3*  HCT 27.7*  PLT 515*    Recent Labs    02/17/23 0537  NA 139  K 3.5  CL 102  CO2 24  GLUCOSE 147*  BUN 24*  CREATININE 0.63  CALCIUM 8.9     Intake/Output Summary (Last 24 hours) at 02/19/2023 1127 Last data filed at 02/18/2023 1831 Gross per 24 hour  Intake 240 ml  Output --  Net 240 ml         Physical Exam: Vital Signs Blood pressure (!) 141/61, pulse 88, temperature 98.5 F (36.9 C), temperature source Oral, resp. rate 16, height 5\' 2"  (1.575 m), weight 32.1 kg, SpO2 99 %. Physical Exam:   Constitutional: No apparent distress. Appropriate appearance for age. Lying in bed HENT: No JVD. Neck Supple. Trachea midline.  Eyes: PERRLA. EOMI. + mild L exotropia Cardiovascular: RRR, no murmurs/rub/gallops. No Edema. Peripheral pulses 2+  Respiratory: CTAB, decreased breath sounds in bilateral lower lobes.+ Nasal cannula with 2 L. Abdomen: + bowel sounds, normoactive. No distention or tenderness.  GU: Not examined.  Skin: C/D/I. + erythema and excoriations on L posterior scalp - much improved  + Hemicrani site mostly well healed; open area from scratching on anterior incision line -now slightly raised, not erythematous, no fluctuance, no warmth or expressible drainage. -covered in Mepilex, appears stable from yesterday  MSK:      No apparent deformity.      Strength: Slight left-sided weakness compared to right,  otherwise antigravity and against resistance in all 4 extremities.   Neurologic exam:  Cognition: AAO to person; place with cues; not time -stable Language: Fluent, No substitutions or neoglisms. No dysarthria. Names 3/3 objects correctly  Memory: +deficits, severe; poor carryover Insight: Poor insight into current condition.  Mood: Flat affect, appropriate mood.  Sensation: To light touch intact in BL UEs and LEs  Reflexes: 1+ in BL UE and LEs. Negative Hoffman's and babinski signs bilaterally.  CN:+ L facial droop Coordination: + ataxia on FTN L>R.  Does grip and mobilize on command with left upper extremity - unchanged Tone: MAS 2 left elbow flexors, otherwise minimal in left upper extremity, absent left lower extremity. No paion with full ROM L elbow.    Assessment/Plan: 1. Functional deficits which require 3+ hours per day of interdisciplinary therapy in a comprehensive inpatient rehab setting. Physiatrist is providing close team supervision and 24 hour management of active medical problems listed below. Physiatrist and rehab team continue to assess barriers to discharge/monitor patient progress toward functional and medical goals  Care Tool:  Bathing    Body parts bathed by patient: Right arm, Left arm, Chest, Abdomen, Left upper leg, Face   Body parts bathed by helper: Front perineal area, Buttocks, Right upper leg, Right lower leg, Left lower leg     Bathing assist Assist Level: Moderate Assistance - Patient 50 - 74%     Upper Body Dressing/Undressing Upper body dressing   What is the patient wearing?: Pull over shirt    Upper body assist Assist Level: Minimal Assistance - Patient > 75%    Lower Body Dressing/Undressing Lower body dressing      What is the patient wearing?: Pants     Lower body assist Assist for lower body dressing: Maximal Assistance - Patient 25 - 49%     Toileting Toileting    Toileting assist Assist for toileting: Maximal Assistance -  Patient 25 - 49%     Transfers Chair/bed transfer  Transfers assist     Chair/bed transfer assist level: Minimal Assistance - Patient > 75%     Locomotion Ambulation   Ambulation assist      Assist level: Minimal Assistance - Patient > 75% Assistive device: Walker-rolling Max distance: 20 ft   Walk 10 feet activity   Assist     Assist level: Minimal Assistance - Patient > 75% Assistive device: Walker-rolling   Walk 50 feet activity   Assist    Assist level: Minimal Assistance - Patient > 75% Assistive device: Walker-rolling    Walk 150 feet activity   Assist Walk 150 feet activity did not occur: Safety/medical concerns         Walk 10 feet on uneven surface  activity   Assist     Assist level: Minimal Assistance - Patient > 75% Assistive device: Walker-rolling   Wheelchair     Assist Is the patient using a wheelchair?: Yes Type of Wheelchair: Manual    Wheelchair assist level: Total Assistance - Patient < 25% Max wheelchair distance: 250ft    Wheelchair 50 feet with  2 turns activity    Assist        Assist Level: Total Assistance - Patient < 25%   Wheelchair 150 feet activity     Assist      Assist Level: Total Assistance - Patient < 25%   Blood pressure (!) 141/61, pulse 88, temperature 98.5 F (36.9 C), temperature source Oral, resp. rate 16, height 5\' 2"  (1.575 m), weight 32.1 kg, SpO2 99 %.  Medical Problem List and Plan: 1. Functional deficits secondary to nontraumatic brain injury with SDH status post right frontal burr hole 02/03/2023  - Other pertinent Hx: VP shunt removed, replacement for NPH approximately 5 months ago followed by neurosurgery Dr. Meade Maw; Hx CVA with left-sided residual weakness.             -patient may shower             -ELOS/Goals: 10-14 days, SPV PT/OT/SLP - goal DC 3/15  -Placed 15/7 due to poor therapy tolerance    2.  Antithrombotics: -DVT/anticoagulation:   Pharmaceutical: Lovenox initiated 02/05/2023             -antiplatelet therapy: Aspirin 81 mg daily 3. Pain Management: Tylenol as needed  4. Mood/Behavior/Sleep/anxiety/depression: Wellbutrin 300 mg daily, BuSpar 30 mg twice daily, Namenda 5 mg twice daily, melatonin 10 mg nightly             -antipsychotic agents: N/A 5. Neuropsych/cognition: This patient is not capable of making decisions on her own behalf. 6. Skin/Wound Care: Routine skin checks   - 3/16-17: Benadryll to scalp BID. Nursing order to keep open areas covered to prevent further itching, excoriations and potential infection   - 3/19: Reinforced to nursing need to cover excoriated areas, open area appears inflamed but not currently infected.  Patient was very poor cognitive carryover, unlikely to follow commands to stop itching surgical site - improved appearance   -Benadryl cream to posterior head rash twice daily.  Patient has allergy to triple antibiotic ointment.  7. Fluids/Electrolytes/Nutrition: Routine in and outs with follow-up chemistries -Admission labs stable, monitor.  8.  Chronic COPD without exacerbation as well as history of lung cancer status post right upper lobe lobectomy, former smoker.  Supplemental oxygen as needed.  Continue inhalers as directed - 3/16: Discussed with family, patient on continuous oxygen 2 to 3 L at home, significant respiratory history with no cognitive deficits.  Daughter is concerned that as needed oxygen will confuse her, and she will get used to removing it, which will make it more difficult to manage at home and is something she is witnessed already today.  This is a reasonable consideration, replaced oxygen orders with continuous with goal sats 89 to 97%. - 3/17: Satting 90-100% on RA intermittently; had discussed with caught risks of high oxygen sat for chronic lung dz yesterday, may need to re-engage this week - 3/19: Discussed keeping her oxygen on her and adjusting rate with therapies,  nursing.  9.  Hyperlipidemia.  Crestor 10.  Hypertension.  Currently on Norvasc 2.5 mg daily.  Blood pressure has been soft losartan currently on hold.  - well controlled; monitor -3/18 well controlled, monitor    02/19/2023    5:12 AM 02/18/2023    7:43 PM 02/18/2023    1:40 PM  Vitals with BMI  Systolic [id number]  Diastolic 61 66 67  Pulse 88 93 91     11.  Diabetes mellitus.  Hemoglobin A1c 6.8.  SSI/Glucotrol 2.5 mg daily Recent  Labs    02/18/23 2110 02/19/23 0606 02/19/23 1123  GLUCAP 104* 129* 211*     - 3/17: Mildly high but overall controlled; monitor  -3/18 overall controlled, continue to monitor   - 3/20: Some intermittent elevated blood glucose into 200, however intermittent lows as well, last A1c 6.8, would not further adjust Glipizide  12.  Chronic anemia.  Continue iron supplement.  Follow-up CBC  -3/18 recheck ordered for tomorrow, does not appear it was completed today   - 319 -hemoglobin 8.3, stable, monitor 13.  Recurrent UTIs.  Monitor for any dysuria or hematuria  - 3/20: Daughter feels patient is increasingly confused, states this is a common symptom of UTI for her, will check urinalysis today. - 3/21: UA +, awaiting culture, start on Kelfex 500 mg BID for 5 days. Once finished, may reculture, as patient is asymptomatic aside form cognitive deficits not apporeciated by therapies and may be simply colonized.  14.  Right THA 09/07/2022.  Weightbearing as tolerated 15.  Nose colonized with MRSA.  Bactroban as indicated 16.  GERD.  Protonix 17. Leukocytosis -resolved 3/19  18.  Left-sided hemiparesis, from prior stroke, with spasticity at the left elbow.  Does not cause pain, patient able to functionally attend to the left side, and feel that antispasticity medications would affect her cognition more than would benefit.  Can discuss bracing, but will monitor for now.  - 3/21: Adding PRN baclofen 20 mg for spasticity; will not schedule d/t cognitive  effects  LOS: 6 days A FACE TO FACE EVALUATION WAS PERFORMED  Gertie Gowda 02/19/2023, 11:27 AM

## 2023-02-19 NOTE — Progress Notes (Signed)
Patient ID: Amanda Porter, female   DOB: [birth year], 77 y.o.   MRN: [id number]  Covering for primary SW, Auria.  Sw spoke with daughter, [name] and informed her that her daughter would be able to attend hospital to complete family education.  Daughter informed SW that she sat through family education today and completed it. Daughter shared that she spoke with the physician and a extension would be considered at the next team conference meeting. Sw will follow up with team. No additional questions or concerns.

## 2023-02-19 NOTE — Progress Notes (Signed)
Physical Therapy Session Note  Patient Details  Name: ANGLIA Porter MRN: [id number] Date of Birth: March 09, 1946  Today's Date: 02/19/2023 PT Individual Time: 1st Treatment Session: 0915-1000; 2nd Treatment Session: 1115-1200 PT Individual Time Calculation (min): 45 min; 45 min  Short Term Goals: Week 1:  PT Short Term Goal 1 (Week 1): Pt will perform >133ft of gait w/ Min A using LRAD PT Short Term Goal 2 (Week 1): Pt will be CGA for transfers PT Short Term Goal 3 (Week 1): Pt will tolerate standing for >2 minutes  Skilled Therapeutic Interventions/Progress Updates:  1st Treatment Session- Patient greeted supine in bed asleep, but easily arousable and agreeable to PT treatment session. Patient reported 8/10 pain in her head with RN notified and pain medication was administered at start of treatment session. Patient transitioned to the EOB with use of bed rail and Supv for safety- While sitting EOB, patient washed her face and body with a washcloth and set-up assistance. Patient was then able to don deodorant and shirt with MinA for L UE management. Therapist threaded pants and donned socks and shoes for time management. Patient stood from EOB with use of RW and CGA- VC for improved sequencing throughout stand. While standing, therapist pulled pants over her hips and patient performed stand pivot transfer to wheelchair with CGA. Patient wheeled to rehab gym for time management and energy conservation.   Patient tasked with massed practice of transfer training training in order to improve overall independence and ensure patient is able to return to her adult daycare- Patient performed x4 stand pivot transfers with RW and supv. VC provided at start of transfer with end destination, as well as having patient repeat back where she is transferring to. Patient was able to perform 3/4 transfers without redirection, however during the final transfer patient required VC for sitting in the wheelchair as patient  was unable to recall where she was supposed to transfer. Therapist placed two cones on mat table and patient was to transfer in between the cones with good ability noted.   Patient returned to her room and left sitting upright in wheelchair with posey belt on, call bell within reach and all needs met.    2nd Treatment Session- Patient greeted sitting upright in wheelchair with RN and daughter present and agreeable to PT treatment session- RN placed medication on patient's head in order to reduce urge of itching and opening scab again. Family training was completed this treatment session in order to ensure a safe discharge home and let the daughter see how the patient is moving. Patient wheeled to the rehab gym for time management and energy conservation.   Patient stood from wheelchair with RW and Supv- Increased time required to complete with VC for reminding her task at hand. Patient gait trained >150' from rehab gym back to her room since patient reported she had an incontinent episode of urine. VC at times throughout gait trial for obstacle negotiation. Patient stood in the bathroom with supv while therapist doffed soiled brief and donned clean brief. Patient noted pants were soiled as well, so patient ambulated to the bathroom with supv and RW where she doffed pants and sat EOB while therapist threaded clean pants and re-donned shoes. Patient ambulated 47' out of her room with Supv and therapist brought wheelchair behind her for a seated rest break and time management. Patient wheeled to main gym where she ascended/descended x4 steps with B HR and Supv- Stair mobility performed in order to ensure  patient is able to ascend/descend the steps for the bus. Patient returned to her room and left sitting upright in wheelchair with call bell within reach, daughter present, posey belt on and all needs met. All questions were answered throughout treatment session and provided education regarding need for 24/7  supv at this time in order to ensure safety.     Therapy Documentation Precautions:  Precautions Precautions: Fall Precaution Comments: monitor HR & O2, L sided weakness Restrictions Weight Bearing Restrictions: No  Therapy/Group: Individual Therapy  Amanda Porter 02/19/2023, 7:52 AM

## 2023-02-19 NOTE — Discharge Summary (Signed)
Physician Discharge Summary  Patient ID: Amanda Porter MRN: [id number] DOB/AGE: 1946/08/17 77 y.o.  Admit date: 02/13/2023 Discharge date:   Discharge Diagnoses:  Principal Problem:   SDH (subdural hematoma) (HCC) DVT prophylaxis Mood stabilization Chronic COPD with history of lung cancer Hyperlipidemia Hypertension Diabetes mellitus Chronic anemia Recurrent UTI History of right THA GERD History of CVA with left-sided deficits  Discharged Condition: Stable  Significant Diagnostic Studies: CT HEAD WO CONTRAST (5MM)  Result Date: 02/17/2023 CLINICAL DATA:  Follow-up examination for subdural hematoma. EXAM: CT HEAD WITHOUT CONTRAST TECHNIQUE: Contiguous axial images were obtained from the base of the skull through the vertex without intravenous contrast. RADIATION DOSE REDUCTION: This exam was performed according to the departmental dose-optimization program which includes automated exposure control, adjustment of the mA and/or kV according to patient size and/or use of iterative reconstruction technique. COMPARISON:  Prior study from 02/09/2023. FINDINGS: Brain: Previously identified right-sided subdural collection has decreased in size from previous, now measuring no more than 4 mm in maximal thickness. No hyperdense small amount of residual hyperdense blood products, similar to prior. No significant mass effect or midline shift. No other new acute intracranial hemorrhage. No acute large vessel territory infarct. Age-related cerebral atrophy with advanced chronic microvascular ischemic disease. Associated chronic lacunar infarcts noted about the thalami. Ventriculomegaly, stable from prior. Vascular: No abnormal hyperdense vessel. Scattered vascular calcifications noted within the carotid siphons. Skull: Prior right frontal burr hole craniotomy. Calvarium otherwise intact. Scalp soft tissues demonstrate no acute finding. Sinuses/Orbits: Right gaze preference noted. Paranasal sinuses are  clear. Trace left mastoid effusion noted, of doubtful significance. Other: None. IMPRESSION: 1. Interval decrease in size of right-sided subdural collection, now measuring no more than 4 mm in maximal thickness. No significant mass effect or midline shift. 2. No other acute intracranial abnormality. 3. Ventriculomegaly, stable from prior. 4. Underlying atrophy with chronic microvascular ischemic disease. Electronically Signed   By: Jeannine Boga M.D.   On: 02/17/2023 20:27   CT HEAD WO CONTRAST (5MM)  Result Date: 02/09/2023 CLINICAL DATA:  Altered mental status, status post right subdural hematoma drainage EXAM: CT HEAD WITHOUT CONTRAST TECHNIQUE: Contiguous axial images were obtained from the base of the skull through the vertex without intravenous contrast. RADIATION DOSE REDUCTION: This exam was performed according to the departmental dose-optimization program which includes automated exposure control, adjustment of the mA and/or kV according to patient size and/or use of iterative reconstruction technique. COMPARISON:  02/04/2023 FINDINGS: Brain: Interval removal of a previously noted right-sided drain. The right subdural collection now measures up to 6 mm (series 3, image 14), compared to a 2.0 cm width collection that was primarily air on prior exam. A small amount of air remains within the collection. Approximately 4 mm of right-to-left midline shift, previously 7 mm. No acute infarct, hemorrhage, or mass. Ventricular size and configuration is similar to a study from 01/07/2023. The previously noted ventriculostomy catheter is not present. No new extra-axial collection. Remote infarct in the right thalamus. Periventricular white matter changes, likely the sequela of chronic small vessel ischemic disease. Vascular: No hyperdense vessel. Skull: Right frontal and anterior parietal burr holes. Superficial skin staples. No acute fracture or suspicious osseous lesion. Sinuses/Orbits: Clear paranasal  sinuses. No acute finding in the orbits. Other: The mastoids are well aerated. IMPRESSION: 1. Interval removal of the previously noted right-sided drain, with decreased size of the right subdural collection, now 6 mm, and decreased right to left midline shift, now 4 mm. 2. Ventricular size and configuration  is similar to a study from 01/07/2023. Electronically Signed   By: Merilyn Baba M.D.   On: 02/09/2023 21:47   CT HEAD WO CONTRAST (5MM)  Result Date: 02/04/2023 CLINICAL DATA:  78 year old female with increased right side subdural hematoma. Postoperative day 1 status post right frontal burr hole drainage of subdural hematoma. History of right side CSF shunt. EXAM: CT HEAD WITHOUT CONTRAST TECHNIQUE: Contiguous axial images were obtained from the base of the skull through the vertex without intravenous contrast. RADIATION DOSE REDUCTION: This exam was performed according to the departmental dose-optimization program which includes automated exposure control, adjustment of the mA and/or kV according to patient size and/or use of iterative reconstruction technique. COMPARISON:  Head CT yesterday and earlier. FINDINGS: Brain: Right frontal approach CSF shunt, reservoir and subcutaneous catheter have been removed. New right superior vertex burr hole and percutaneous subdural drain placed. Subtotal drainage of the mixed density right subdural hematoma since yesterday. But postoperative gas now within the non collapsed right side subdural space. Trace residual posterior convexity subdural fluid adjacent to the indwelling drain on series 3, image 20. Small volume right frontal horn pneumo ventricle and trace left layering IVH (series 3, image 18) following CSF shunt removal. Decreased intracranial mass effect. Less mass effect on the right lateral ventricle. Leftward midline shift has decreased to 6-7 mm (previously up to 13 mm). Normalized suprasellar cistern. Other basilar cisterns are patent. No evidence of  trapped ventricle. Chronic right thalamic lacunar infarct. Patchy and confluent cerebral white matter hypodensity. No cortically based acute infarct identified. Vascular: Calcified atherosclerosis at the skull base. Skull: New right vertex burr hole superimposed on previous shunt related burr hole. Elsewhere the skull appears intact. Sinuses/Orbits: Visualized paranasal sinuses and mastoids are stable and well aerated. Other: New postoperative changes to the right scalp vertex. Skin staples in place. Orbits appear negative. IMPRESSION: 1. Postoperative subtotal drainage of the mixed density right subdural fluid. Postoperative gas now occupying the non-collapsed right subdural space, with percutaneous subdural drain in place. 2. Small volume pneumo-ventricle and trace dependent intraventricular blood following CSF shunt removal. Improved lateral ventricle patency and regressed leftward midline shift (now 6-7 mm). 3. Normalized basilar cisterns. Chronic cerebral small vessel disease. Electronically Signed   By: Genevie Ann M.D.   On: 02/04/2023 06:08   DG Abd 1 View  Result Date: 02/03/2023 CLINICAL DATA:  Altered mental status, evaluate shunt integrity EXAM: ABDOMEN - 1 VIEW COMPARISON:  None Available. FINDINGS: Scattered large and small bowel gas is noted. Mild retained fecal material is noted within the colon. The visualized shunt catheter extends into the right hemipelvis. No kinking is noted. The catheter is intact. Right hip replacement is seen. IMPRESSION: Shunt catheter is noted to be intact. Electronically Signed   By: Inez Catalina M.D.   On: 02/03/2023 20:45   DG Chest 1 View  Result Date: 02/03/2023 CLINICAL DATA:  Altered mental status and possible shunt malfunction EXAM: PORTABLE CHEST 1 VIEW COMPARISON:  01/11/2023 FINDINGS: Cardiac shadows within normal limits. Postsurgical changes are noted in left hilum. Lungs are well aerated bilaterally. Minimal basilar atelectasis is seen. No bony abnormality is  noted. Shunt catheter is well visualized and intact. IMPRESSION: Intact shunt catheter. Mild bibasilar atelectasis. Electronically Signed   By: Inez Catalina M.D.   On: 02/03/2023 20:44   DG Skull 1-3 Views  Result Date: 02/03/2023 CLINICAL DATA:  Evaluate for shunt malfunction EXAM: SKULL - 1-3 VIEW COMPARISON:  09/06/2022 FINDINGS: New shunt since prior, tip  from right parietal approach in just left of midline. Where visible and radiopaque the shunt is continuous. No underlying osseous finding. IMPRESSION: Unremarkable shunt tubing over the head. Electronically Signed   By: Jorje Guild M.D.   On: 02/03/2023 20:42   DG Cervical Spine 1 View  Result Date: 02/03/2023 CLINICAL DATA:  Altered mental status and possible shunt malfunction, evaluate integrity EXAM: DG CERVICAL SPINE - 1 VIEW COMPARISON:  09/06/2022 FINDINGS: Right-sided shunt catheter is again identified and stable. IMPRESSION: Stable appearing right shunt catheter. Electronically Signed   By: Inez Catalina M.D.   On: 02/03/2023 20:42   CT Head Wo Contrast  Result Date: 02/03/2023 CLINICAL DATA:  Altered mental status. History of hydrocephalus and dementia at baseline but worsening. Uncoordinated left arm. EXAM: CT HEAD WITHOUT CONTRAST CT CERVICAL SPINE WITHOUT CONTRAST TECHNIQUE: Multidetector CT imaging of the head and cervical spine was performed following the standard protocol without intravenous contrast. Multiplanar CT image reconstructions of the cervical spine were also generated. RADIATION DOSE REDUCTION: This exam was performed according to the departmental dose-optimization program which includes automated exposure control, adjustment of the mA and/or kV according to patient size and/or use of iterative reconstruction technique. COMPARISON:  CT head 01/12/2023, CT head and cervical spine 01/11/2023 FINDINGS: CT HEAD FINDINGS Brain: A right frontal ventricular catheter is stable in position terminating in the right lateral ventricle  near the midline. The mixed density right cerebral convexity subdural hematoma is significantly increased in size since 01/12/2023, currently measuring up to 2.4 cm in maximal thickness in the coronal plane, previously measured up to 1.0 cm. There is worsened mass effect with sulcal effacement, near-complete effacement of the right lateral ventricle, and 1.3 cm rightward midline shift. The basal cisterns remain patent. The left lateral ventricle is enlarged, similar to the prior CT. There is no convincing evidence of developing trapped ventricle. A remote infarct in the right thalamus and patchy additional hypodensity in the remainder of the supratentorial white matter are unchanged. Gray-white differentiation is preserved. Vascular: There is calcification of the bilateral carotid siphons. Skull: Normal. Negative for fracture or focal lesion. Sinuses/Orbits: The imaged paranasal sinuses are clear. The imaged globes and orbits are unremarkable. Other: None. CT CERVICAL SPINE FINDINGS Alignment: There is unchanged reversal of the normal cervical curvature. There is no antero or retrolisthesis. There is no jumped or perched facet or other evidence of traumatic malalignment. Skull base and vertebrae: Skull base alignment is maintained. Vertebral body heights are preserved. There is no evidence of acute fracture. There is no suspicious osseous lesion. Soft tissues and spinal canal: No prevertebral fluid or swelling. No visible canal hematoma. Disc levels: Multilevel degenerative changes of the cervical spine are stable since the prior study with disc space narrowing most advanced at C5-C6 and C6-C7. There is no evidence of high-grade spinal canal stenosis. Upper chest: The left upper lobe nodule is incompletely imaged on the current study common better evaluated on recent CT chest from 01/07/2023. Other: None. IMPRESSION: 1. Mixed density right cerebral convexity subdural hematoma is significantly increased in size since  01/12/2023, currently measuring up to 2.4 cm with worsened mass effect with 1.3 cm rightward midline shift. 2. Stable position of the right frontal ventricular catheter without evidence of hydrocephalus or trapped ventricle 3. No acute fracture or traumatic malalignment of the cervical spine. Critical Value/emergent results were called by telephone at the time of interpretation on 02/03/2023 at 7:55 pm to provider [facility] , who verbally acknowledged these results. Electronically  Signed   By: Valetta Mole M.D.   On: 02/03/2023 19:58   CT Cervical Spine Wo Contrast  Result Date: 02/03/2023 CLINICAL DATA:  Altered mental status. History of hydrocephalus and dementia at baseline but worsening. Uncoordinated left arm. EXAM: CT HEAD WITHOUT CONTRAST CT CERVICAL SPINE WITHOUT CONTRAST TECHNIQUE: Multidetector CT imaging of the head and cervical spine was performed following the standard protocol without intravenous contrast. Multiplanar CT image reconstructions of the cervical spine were also generated. RADIATION DOSE REDUCTION: This exam was performed according to the departmental dose-optimization program which includes automated exposure control, adjustment of the mA and/or kV according to patient size and/or use of iterative reconstruction technique. COMPARISON:  CT head 01/12/2023, CT head and cervical spine 01/11/2023 FINDINGS: CT HEAD FINDINGS Brain: A right frontal ventricular catheter is stable in position terminating in the right lateral ventricle near the midline. The mixed density right cerebral convexity subdural hematoma is significantly increased in size since 01/12/2023, currently measuring up to 2.4 cm in maximal thickness in the coronal plane, previously measured up to 1.0 cm. There is worsened mass effect with sulcal effacement, near-complete effacement of the right lateral ventricle, and 1.3 cm rightward midline shift. The basal cisterns remain patent. The left lateral ventricle is enlarged, similar  to the prior CT. There is no convincing evidence of developing trapped ventricle. A remote infarct in the right thalamus and patchy additional hypodensity in the remainder of the supratentorial white matter are unchanged. Gray-white differentiation is preserved. Vascular: There is calcification of the bilateral carotid siphons. Skull: Normal. Negative for fracture or focal lesion. Sinuses/Orbits: The imaged paranasal sinuses are clear. The imaged globes and orbits are unremarkable. Other: None. CT CERVICAL SPINE FINDINGS Alignment: There is unchanged reversal of the normal cervical curvature. There is no antero or retrolisthesis. There is no jumped or perched facet or other evidence of traumatic malalignment. Skull base and vertebrae: Skull base alignment is maintained. Vertebral body heights are preserved. There is no evidence of acute fracture. There is no suspicious osseous lesion. Soft tissues and spinal canal: No prevertebral fluid or swelling. No visible canal hematoma. Disc levels: Multilevel degenerative changes of the cervical spine are stable since the prior study with disc space narrowing most advanced at C5-C6 and C6-C7. There is no evidence of high-grade spinal canal stenosis. Upper chest: The left upper lobe nodule is incompletely imaged on the current study common better evaluated on recent CT chest from 01/07/2023. Other: None. IMPRESSION: 1. Mixed density right cerebral convexity subdural hematoma is significantly increased in size since 01/12/2023, currently measuring up to 2.4 cm with worsened mass effect with 1.3 cm rightward midline shift. 2. Stable position of the right frontal ventricular catheter without evidence of hydrocephalus or trapped ventricle 3. No acute fracture or traumatic malalignment of the cervical spine. Critical Value/emergent results were called by telephone at the time of interpretation on 02/03/2023 at 7:55 pm to provider [facility] , who verbally acknowledged these results.  Electronically Signed   By: Valetta Mole M.D.   On: 02/03/2023 19:58    Labs:  Basic Metabolic Panel: Recent Labs  Lab 02/17/23 0537 02/20/23 0649  NA 139  --   K 3.5  --   CL 102  --   CO2 24  --   GLUCOSE 147*  --   BUN 24*  --   CREATININE 0.63 0.56  CALCIUM 8.9  --     CBC: Recent Labs  Lab 02/17/23 0537  WBC 7.8  HGB  8.3*  HCT 27.7*  MCV 87.4  PLT 515*    CBG: Recent Labs  Lab 02/21/23 1619 02/21/23 2101 02/22/23 0558 02/22/23 1205 02/22/23 1609  GLUCAP 126* 126* 123* 169* 172*   Family history.  Mother with pneumonia Sister with hyperlipidemia hypertension diabetes mellitus pancreatic cancer Brother with throat cancer.  Denies any breast cancer or colon cancer  Brief HPI:   Amanda Porter is a 77 y.o. right-handed female with history of COPD as well as lung cancer status post upper lobe lobectomy, former smoker chronic anemia anxiety with depression, dementia maintained on Namenda, CVA with left-sided deficits, diabetes mellitus hyperlipidemia hypertension, tardive dyskinesia, recurrent UTIs, right THA 09/07/2022 per Dr. Harlow Mares, right VP shunt replacement for NPH about 5 months ago per Dr. Elayne Guerin.  Per chart review lives with daughter and autistic grandchild.  Patient reportedly attends adult daycare.  Dating back to December patient was modified independent ambulating with a rollator with steady decline since January requiring assistance for transfers ambulating only short distances and family also reports increased confusion.  Family planning assistance as needed on discharge.  [facility] with increasing confusion and gait disorder.  Cranial CT scan showed mixed density right cerebral convexity subdural hematoma significantly increased in size since 01/12/2023 currently measuring up to 2.4 cm with worsening mass effect and a 1.3 cm rightward midline shift.  Stable position of right frontal ventricular catheter without evidence of hydrocephalus  or trapping ventricle.  Patient underwent right frontal burr hole for drainage of subdural hematoma 02/03/2023 per Dr. Cari Caraway and VP shunt removed.  Latest cranial CT scan 02/09/2023 showed decreased size of right subdural collection now 6 mm decreased right to left midline shift now 4 mm.  Hospital course patient was cleared to resume low-dose aspirin.  Lovenox initiated for DVT prophylaxis 02/05/2023.  Tolerating a regular consistency diet.  Patient with chronic anemia hemoglobin dating back to 12/26/2022 of 8.7-8.3.  Palliative care consulted to establish goals of care.  Therapy evaluations completed due to patient decreased functional mobility and altered mental status was admitted for a comprehensive rehab program.   Hospital Course: Kerria A Witty was admitted to rehab 02/13/2023 for inpatient therapies to consist of PT, ST and OT at least three hours five days a week. Past admission physiatrist, therapy team and rehab RN have worked together to provide customized collaborative inpatient rehab.  Pertaining to patient's nontraumatic brain injury with subdural hematoma status post right frontal burr hole 02/03/2023.  VP shunt removed replacement for NPH approxi-5 months ago followed by neurosurgery Dr. Cari Caraway as well as history of CVA left-sided residual weakness.  Latest cranial CT scan 02/17/2023 showed interval decrease in size of right-sided subdural collection now measuring no more than 4 mm.  No other acute intracranial abnormality.  Patient was cleared to resume low-dose aspirin therapy.  Lovenox had been initiated for 02/05/2023 for DVT prophylaxis no bleeding episodes.  Mood stabilization with Wellbutrin, BuSpar as well as Namenda with melatonin to help aid in sleep.  Chronic COPD without exacerbation as well as history of lung cancer with right upper lobe lobectomy in the past patient is a former smoker.  Supplemental oxygen only as needed.  Patient had been on continuous oxygen at home and placed on  continuous as needed with goals of 89 to 97% oxygen saturations.  Crestor ongoing for hyperlipidemia.  Blood pressure soft maintained on low-dose Norvasc's with losartan on hold patient will need outpatient follow-up.  Blood sugars hemoglobin A1c 6.8  Glucotrol 2.5 mg daily.  Patient with recurrent UTIs monitoring for any dysuria or hematuria with latest urine study 02/18/2023  positive nitrite with culture less than 10,000 and significant growth and placed on Keflex empirically.  History of right THA 09/07/2022 weightbearing as tolerated.  Protonix ongoing for GERD.  Patient with chronic anemia latest hemoglobin 8.3 maintained on iron supplement.   Blood pressures were monitored on TID basis and soft and monitored  Diabetes has been monitored with ac/hs CBG checks and SSI was use prn for tighter BS control.    Rehab course: During patient's stay in rehab weekly team conferences were held to monitor patient's progress, set goals and discuss barriers to discharge. At admission, patient required minimal guard 55 feet rolling walker minimal assist sit to stand  Physical exam.  Blood pressure 137/52 pulse 91 temperature 98.8 respirations 18 oxygen saturation is 91% room air Constitutional.  No acute distress HEENT Head.  Normocephalic and atraumatic Eyes.  Pupils round and reactive to light no discharge without nystagmus Neck.  Supple nontender no JVD without thyromegaly Cardiac regular rate and rhythm without any extra sounds or murmur heard Abdomen.  Soft nontender positive bowel sounds without rebound Skin.  Clean dry and intact some erythema and excoriation to left posterior scalp.  Hemi crani site clean dry and intact Musculoskeletal 4/5 throughout left upper and left lower extremity 5/5 throughout right upper right lower extremity Neurologic.  Alert to person place with some cues.  No dysarthria names 3/3 objects correctly  He/She  has had improvement in activity tolerance, balance, postural  control as well as ability to compensate for deficits. He/She has had improvement in functional use RUE/LUE  and RLE/LLE as well as improvement in awareness.  While seated edge of bed patient could don pants with minimal assist for threading right lower extremity.  She did need some assistance for putting on her shoes.  Required some redirection for putting on her shirt.  Patient was able to doff her shirt with supervision and set up.  Patient stood edge of bed rolling walker and standby assist.  Patient can stand from edge of bed rolling walker standby assist and perform stand pivot transfers to wheelchair with verbal cues.  Close monitoring of oxygen saturations with activity.  Ambulates 180 feet x 2 rolling walker standby assist for safety.  She did need some encouragement at times to participate.  Speech therapy follow-up patient was independently oriented to place and city and provided general information regarding reason for hospitalization.  Some disorientation to time but utilize the calendar with minimal verbal cues.  Patient also identified problems and possible consequences with overall minimal verbal cues.  Full family teaching completed plan discharge to home       Disposition: Discharge to home    Diet: Diabetic diet  Special Instructions: No driving smoking or alcohol  Continue chronic Oxygen  Medications at discharge 1.  Tylenol as needed 2.  Norvasc 2.5 mg p.o. daily 3.  Vitamin C 500 mg p.o. daily 4.  Aspirin 81 mg p.o. daily 5.  Wellbutrin XL 300 mg p.o. daily 6.  BuSpar 30 mg p.o. twice daily 7.  Ferrous sulfate 325 mg p.o. daily 8.  Folic acid 1 mg p.o. daily 9.  Glucotrol XL 2.5 mg p.o. daily 10.  Lactobacillus 1 g p.o. daily 11.  Melatonin 10 mg p.o. nightly 12.  Namenda 5 mg p.o. twice daily 13.  Multivitamin daily 14.  Crestor 20 mg p.o. daily 15.  Protonix 40 mg p.o. daily 16.  Albuterol inhaler 2 puffs every 4 hours as needed 17.Stiolto Respimat inhaled 2  puffs into the lungs every morning 18.  Fioricet 1 tablet every 8 hours as needed headache 19.  Keflex 500 mg every 12 hours x 1 day and stop 20.  Calcium/vitamin D 1 tablet daily 21.  Baclofen 5 mg daily as needed muscle spasms   30-35 minutes were spent completing discharge summary and discharge planning  Discharge Instructions     Ambulatory referral to Physical Medicine Rehab   Complete by: As directed    Moderate complexity follow-up 1 to 2 weeks SDH        Follow-up Information     Gertie Gowda, DO Follow up.   Specialty: Physical Medicine and Rehabilitation Why: Office to call for appointment Contact information: [address] [place] [address] [phone]         Meade Maw, MD Follow up.   Specialty: Neurosurgery Why: Call for appointment Contact information: [place] [address] [phone]                 Signed: Cathlyn Parsons 02/22/2023, 7:31 PM

## 2023-02-19 NOTE — Progress Notes (Addendum)
Occupational Therapy Session Note  Patient Details  Name: Amanda Porter MRN: [id number] Date of Birth: Apr 13, 1946  Today's Date: 02/19/2023 Session 1 OT Individual Time: 1300-1330 OT Individual Time Calculation (min): 30 min   Session 2 OT Individual Time: [id number] OT Individual Time Calculation (min): 59 min    Short Term Goals: Week 1:  OT Short Term Goal 1 (Week 1): Pt will complete 2/3 steps during toileting OT Short Term Goal 2 (Week 1): Pt will complete sit > stand with CGA using LRAD OT Short Term Goal 3 (Week 1): Pt will thread LB clothing with supervision using AE PRN  Skilled Therapeutic Interventions/Progress Updates:   Session 1 Pt greeted sitting in wc with daughter present. Pt reported some itching on head so OT applied hydrocortisone to spots of irritation. Pt then agreeable to therapy session. Pt's daughter left for the remainder of session. Pt brought down to therapy apartment in wc for time management. OT issued RW bag and practiced collected food items from counter top height. Cues for safety with RW positioning. Multiple rest breaks due to shortness of breath. Pt on 2L of O2 throughout session.  Educated on safety awareness in kitchen environment and discussed PLOF. Pt returned to room and wanted to returned to bed, but agreeable to stay up for last therapy session with encouragement. Pt left seated in wc with alarm belt on, call bell in reach, and needs met.   Session 2  Pt greeted still seated in wc and agreeable to go to the gym. Worked on standing balance/endurance standing on foam block while performing peg board task. CGA sit<>stand and CGA for balance standing on uneven surface. Pt only tolerated standing for 2 minutes before reaching max fatigue and requesting to sit despite encouragement to stay standing. Pt needed moderate multimuldal cues to integrate  L UE into peg board task. Pt unable to follow simple 2 color pattern, so we only worked on the fine motor  skills pf placing any small peg into peg hold. Focus on in-hand manipulation, translation, and rotation. OT had to incorporate modified CIMT by holding her R hand to force use of her L-pt tolerated well. OT issued soft tan theraputty and worked on gip and pinch strength with moderate cues. Pt returned to room and reported need to urinate. PT ambulated into bathroom w/ RW and close supervision/CGA when turning to sit on commode. Pt needed standing rest break before removing pants. Supervision for toileting tasks and increased time. PT ambulated back to bed and returned to supine with 2 standing rest breaks and close supervision/CGA w/ RW. Pt left semi-reclined in bed with bed alarm on, call bell in reach, and needs met.   Therapy Documentation Precautions:  Precautions Precautions: Fall Precaution Comments: monitor HR & O2, L sided weakness Restrictions Weight Bearing Restrictions: No  Vital Signs: Therapy Vitals Temp: 98.7 F (37.1 C) Pulse Rate: 85 Resp: 16 BP: 127/70 Patient Position (if appropriate): Sitting Oxygen Therapy SpO2: 100 % O2 Device: Nasal Cannula O2 Flow Rate (L/min): 1 L/min Pain:  Pt reports scalp discomfort. Rest and respositioned    Other Treatments:     Therapy/Group: Individual Therapy  Valma Cava 02/19/2023, 3:14 PM

## 2023-02-20 LAB — GLUCOSE, CAPILLARY
Glucose-Capillary: 120 mg/dL — ABNORMAL HIGH (ref 70–99)
Glucose-Capillary: 187 mg/dL — ABNORMAL HIGH (ref 70–99)
Glucose-Capillary: 92 mg/dL (ref 70–99)
Glucose-Capillary: 131 mg/dL — ABNORMAL HIGH (ref 70–99)

## 2023-02-20 LAB — URINE CULTURE
Culture: <10000 — AB
Special Requests: NORMAL

## 2023-02-20 LAB — CREATININE, SERUM
Creatinine, Ser: 0.56 mg/dL (ref 0.44–1.00)
GFR, Estimated: >60 mL/min (ref >60)

## 2023-02-20 NOTE — Progress Notes (Addendum)
Patient ID: Amanda Porter, female   DOB: 06-24-1946, 77 y.o.   MRN: [id number]  Covering for primary SW, Auria.   Sw spoke with pt's daughter, [name] to confirm preference of HH. Daughter prefers [facility]. Sw will reach out to agency and follow up. No additional questions or concerns.   [facility] referral sent to [facility]

## 2023-02-20 NOTE — Progress Notes (Signed)
PROGRESS NOTE   Subjective/Complaints:  No acute concerns, complaints.  Attempt made last night and this morning to call daughter for medical update, went to voicemail without identifier.  Patient doing well overall, medically stable.   ROS: +itching scalp - improved; + urinary incontinence  -ongoing denies fevers, chills, malaise, N/V, abdominal pain, constipation, diarrhea, SOB, cough, chest pain, new weakness or paraesthesias.    Objective:   No results found. No results for input(s): "WBC", "HGB", "HCT", "PLT" in the last 72 hours.  Recent Labs    02/20/23 0649  CREATININE 0.56     Intake/Output Summary (Last 24 hours) at 02/20/2023 1557 Last data filed at 02/20/2023 1500 Gross per 24 hour  Intake 677 ml  Output --  Net 677 ml         Physical Exam: Vital Signs Blood pressure (!) 150/55, pulse 89, temperature 98.5 F (36.9 C), temperature source Oral, resp. rate 16, height 5\' 2"  (1.575 m), weight 34.2 kg, SpO2 100 %. Physical Exam:   Constitutional: No apparent distress. Appropriate appearance for age. Lying in bed, resting HENT: No JVD. Neck Supple. Trachea midline.  Eyes: PERRLA. EOMI. + mild L exotropia Cardiovascular: RRR, no murmurs/rub/gallops. No Edema. Peripheral pulses 2+  Respiratory: CTAB, decreased breath sounds in bilateral lower lobes.+ Nasal cannula with 2 L. Abdomen: + bowel sounds, normoactive. No distention or tenderness.  GU: Not examined.   Skin: C/D/I. + erythema and excoriations on L posterior scalp - much improved  + Hemicrani site mostly well healed; open area from scratching on anterior incision line -slightly raised but improving, no further bleeding MSK:      No apparent deformity.      Strength: Slight left-sided weakness compared to right, otherwise antigravity and against resistance in all 4 extremities.   Neurologic exam:  Cognition: AAO to person; place with cues; not time  -stable Language: Fluent, No substitutions or neoglisms. No dysarthria. Names 3/3 objects correctly  Memory: +deficits, severe; poor carryover Insight: Poor insight into current condition.  Mood: Flat affect, appropriate mood.  Sensation: To light touch intact in BL UEs and LEs  Reflexes: 1+ in BL UE and LEs. Negative Hoffman's and babinski signs bilaterally.  CN:+ L facial droop Coordination: + ataxia on FTN L>R.  Does grip and mobilize on command with left upper extremity - unchanged Tone: MAS 2 left elbow flexors, otherwise minimal in left upper extremity, absent left lower extremity. No paion with full ROM L elbow.    Assessment/Plan: 1. Functional deficits which require 3+ hours per day of interdisciplinary therapy in a comprehensive inpatient rehab setting. Physiatrist is providing close team supervision and 24 hour management of active medical problems listed below. Physiatrist and rehab team continue to assess barriers to discharge/monitor patient progress toward functional and medical goals  Care Tool:  Bathing    Body parts bathed by patient: Right arm, Left arm, Chest, Abdomen, Left upper leg, Face, Front perineal area, Buttocks, Right upper leg, Right lower leg, Left lower leg   Body parts bathed by helper: Front perineal area, Buttocks, Right upper leg, Right lower leg, Left lower leg     Bathing assist Assist Level: Supervision/Verbal cueing  Upper Body Dressing/Undressing Upper body dressing   What is the patient wearing?: Pull over shirt    Upper body assist Assist Level: Supervision/Verbal cueing    Lower Body Dressing/Undressing Lower body dressing      What is the patient wearing?: Pants     Lower body assist Assist for lower body dressing: Supervision/Verbal cueing     Toileting Toileting    Toileting assist Assist for toileting: Supervision/Verbal cueing     Transfers Chair/bed transfer  Transfers assist     Chair/bed transfer assist  level: Supervision/Verbal cueing     Locomotion Ambulation   Ambulation assist      Assist level: Minimal Assistance - Patient > 75% Assistive device: Walker-rolling Max distance: 20 ft   Walk 10 feet activity   Assist     Assist level: Minimal Assistance - Patient > 75% Assistive device: Walker-rolling   Walk 50 feet activity   Assist    Assist level: Minimal Assistance - Patient > 75% Assistive device: Walker-rolling    Walk 150 feet activity   Assist Walk 150 feet activity did not occur: Safety/medical concerns         Walk 10 feet on uneven surface  activity   Assist     Assist level: Minimal Assistance - Patient > 75% Assistive device: Walker-rolling   Wheelchair     Assist Is the patient using a wheelchair?: Yes Type of Wheelchair: Manual    Wheelchair assist level: Total Assistance - Patient < 25% Max wheelchair distance: 247ft    Wheelchair 50 feet with 2 turns activity    Assist        Assist Level: Total Assistance - Patient < 25%   Wheelchair 150 feet activity     Assist      Assist Level: Total Assistance - Patient < 25%   Blood pressure (!) 150/55, pulse 89, temperature 98.5 F (36.9 C), temperature source Oral, resp. rate 16, height 5\' 2"  (1.575 m), weight 34.2 kg, SpO2 100 %.  Medical Problem List and Plan: 1. Functional deficits secondary to nontraumatic brain injury with SDH status post right frontal burr hole 02/03/2023  - Other pertinent Hx: VP shunt removed, replacement for NPH approximately 5 months ago followed by neurosurgery Dr. Meade Maw; Hx CVA with left-sided residual weakness.             -patient may shower             -ELOS/Goals: 10-14 days, SPV PT/OT/SLP - goal DC 3/15  -Placed 15/7 due to poor therapy tolerance  - 3/22: Daughter requested extension of goal discharge date, after discussion with all therapies feel that she is doing extremely well and has maximized the benefit of  inpatient rehab therapies.  Additional time would not benefit the patient.  Attempted to call the daughter x 2 to inform her of decision not to extend length of stay, unsuccessful in contacting.  Will reattempt tonight.  2.  Antithrombotics: -DVT/anticoagulation:  Pharmaceutical: Lovenox initiated 02/05/2023             -antiplatelet therapy: Aspirin 81 mg daily 3. Pain Management: Tylenol as needed  4. Mood/Behavior/Sleep/anxiety/depression: Wellbutrin 300 mg daily, BuSpar 30 mg twice daily, Namenda 5 mg twice daily, melatonin 10 mg nightly             -antipsychotic agents: N/A 5. Neuropsych/cognition: This patient is not capable of making decisions on her own behalf. 6. Skin/Wound Care: Routine skin checks   - 3/16-17: Ulis Rias  to scalp BID. Nursing order to keep open areas covered to prevent further itching, excoriations and potential infection   - 3/19: Reinforced to nursing need to cover excoriated areas, open area appears inflamed but not currently infected.  Patient was very poor cognitive carryover, unlikely to follow commands to stop itching surgical site - improved appearance   -Benadryl cream to posterior head rash twice daily.  Patient has allergy to triple antibiotic ointment.  7. Fluids/Electrolytes/Nutrition: Routine in and outs with follow-up chemistries -Admission labs stable, monitor.  8.  Chronic COPD without exacerbation as well as history of lung cancer status post right upper lobe lobectomy, former smoker.  Supplemental oxygen as needed.  Continue inhalers as directed - 3/16: Discussed with family, patient on continuous oxygen 2 to 3 L at home, significant respiratory history with no cognitive deficits.  Daughter is concerned that as needed oxygen will confuse her, and she will get used to removing it, which will make it more difficult to manage at home and is something she is witnessed already today.  This is a reasonable consideration, replaced oxygen orders with  continuous with goal sats 89 to 97%. - 3/17: Satting 90-100% on RA intermittently; had discussed with caught risks of high oxygen sat for chronic lung dz yesterday, may need to re-engage this week - 3/19: Discussed keeping her oxygen on her and adjusting rate with therapies, nursing.  9.  Hyperlipidemia.  Crestor 10.  Hypertension.  Currently on Norvasc 2.5 mg daily.  Blood pressure has been soft losartan currently on hold.  - well controlled; monitor -3/18 well controlled, monitor    02/20/2023   12:49 PM 02/20/2023    7:00 AM 02/20/2023    5:19 AM  Vitals with BMI  Weight  75 lbs 6 oz   BMI  0000000   Systolic [id number]  [id number]  Diastolic 55  62  Pulse 89  86     11.  Diabetes mellitus.  Hemoglobin A1c 6.8.  SSI/Glucotrol 2.5 mg daily Recent Labs    02/19/23 2206 02/20/23 0645 02/20/23 1128  GLUCAP 111* 120* 187*     - 3/17: Mildly high but overall controlled; monitor  -3/18 overall controlled, continue to monitor   - 3/20: Some intermittent elevated blood glucose into 200, however intermittent lows as well, last A1c 6.8, would not further adjust Glipizide  12.  Chronic anemia.  Continue iron supplement.  Follow-up CBC  -3/18 recheck ordered for tomorrow, does not appear it was completed today   - 319 -hemoglobin 8.3, stable, monitor 13.  Recurrent UTIs.  Monitor for any dysuria or hematuria  - 3/20: Daughter feels patient is increasingly confused, states this is a common symptom of UTI for her, will check urinalysis today. - 3/21: UA +, awaiting culture, start on Kelfex 500 mg BID for 5 days. Once finished, may reculture, as patient is asymptomatic aside form cognitive deficits not apporeciated by therapies and may be simply colonized. - 3/22: Awaiting urinary culture, remains afebrile, asymptomatic aside from generalized symptoms endorsed by daughter (urinary incontinence, increased confusion).  14.  Right THA 09/07/2022.  Weightbearing as tolerated 15.  Nose colonized with MRSA.   Bactroban as indicated 16.  GERD.  Protonix 17. Leukocytosis -resolved 3/19  18.  Left-sided hemiparesis, from prior stroke, with spasticity at the left elbow.  Does not cause pain, patient able to functionally attend to the left side, and feel that antispasticity medications would affect her cognition more than would benefit.  Can discuss bracing, but  will monitor for now.  - 3/21: Adding PRN baclofen 20 mg for spasticity; will not schedule d/t cognitive effects  LOS: 7 days A FACE TO FACE EVALUATION WAS PERFORMED  Gertie Gowda 02/20/2023, 3:57 PM

## 2023-02-20 NOTE — Progress Notes (Signed)
Speech Language Pathology Weekly Progress and Session Note  Patient Details  Name: Amanda Porter MRN: [id number] Date of Birth: November 28, 1946  Beginning of progress report period: February 14, 2023 End of progress report period: February 20, 2023  Today's Date: 02/20/2023 SLP Individual Time: 1015-1055 SLP Individual Time Calculation (min): 40 min  Short Term Goals: Week 1: SLP Short Term Goal 1 (Week 1): Patient will utilie external aids to recall orientation information with Mod verbal and visual cues. SLP Short Term Goal 1 - Progress (Week 1): Met SLP Short Term Goal 2 (Week 1): Patient will demonstrate sustained attention to functional tasks for 10 minutes with Mod verbal cues for redirection. SLP Short Term Goal 2 - Progress (Week 1): Met SLP Short Term Goal 3 (Week 1): Patient will follow commands during functional tasks with Mod verbal and tactile cues in 90% of opportunities SLP Short Term Goal 3 - Progress (Week 1): Met SLP Short Term Goal 4 (Week 1): Patient will initiate functional tasks with Mod verbal and tactile cues in 75% of opportunities. SLP Short Term Goal 4 - Progress (Week 1): Met    New Short Term Goals: Week 2: SLP Short Term Goal 1 (Week 2): STGs=LTGs due to ELOS  Weekly Progress Updates: Patient has made functional gains and has met 4 of 4 STGs this reporting period. Currently, patient requires overall Min-Mod verbal cues to complete functional and familiar tasks safely in regards to orientation with use of external aids, initiation, and sustained attention.  Patient and family education ongoing. Patient would benefit from continued skilled SLP intervention to maximize her cognitive functioning and overall functional independence prior to discharge.      Intensity: Minumum of 1-2 x/day, 30 to 90 minutes Frequency: 1 to 3 out of 7 days Duration/Length of Stay: 02/23/23 Treatment/Interventions: Cognitive remediation/compensation;Cueing hierarchy;Environmental  controls;Functional tasks;Internal/external aids;Patient/family education;Therapeutic Activities   Daily Session  Skilled Therapeutic Interventions: Pt seen for skilled SLP session to address cognitive goals.  Pt reported significant fatigue today, which likely impacted her results on structured tasks. Pt participated well with additional encouragement. Pt required min cues to reference orientation aids posted around room. She remains disoriented to date and place. She was oriented to situation and exhibited verbal awareness of current deficits that resulted from SDH. Pt participated in game of UNO given max cues to follow process of game. Pt expressed disinterest in game, though did report she enjoys playing board games with her twelve-year-old granddaughter. Pt required max cues to complete simple verbal sequencing tasks of ADLs (I.e. process of completing laundry). Recommend continue SLP PoC. Pt left sitting upright in bed with bed alarm activated and call bell in reach.    Pain Pain Assessment Pain Scale: 0-10 Pain Score: 8  Pain Type: Acute pain Pain Location: Head Pain Orientation: Right Pain Descriptors / Indicators: Aching Pain Frequency: Constant Pain Onset: Progressive Patients Stated Pain Goal: 3 Pain Intervention(s): Medication (See eMAR)  Therapy/Group: Individual Therapy  PAYNE, COURTNEY 02/20/2023, 7:40 AM

## 2023-02-20 NOTE — Progress Notes (Shared)
Occupational Therapy Discharge Summary  Patient Details  Name: Amanda Porter MRN: [id number] Date of Birth: 10-02-1946  Date of Discharge from OT service:{Time; dates multiple:304500300}  {CHL IP REHAB OT TIME CALCULATIONS:304400400}   Patient has met 11 of 11 long term goals due to improved activity tolerance, improved balance, postural control, ability to compensate for deficits, functional use of  LEFT upper and LEFT lower extremity, improved attention, and improved awareness.  Patient to discharge at overall Supervision level.  Patient's care partner is independent to provide the necessary physical and cognitive assistance at discharge.    Reasons goals not met: n/a  Recommendation:  Patient will benefit from ongoing skilled OT services in home health setting to continue to advance functional skills in the area of BADL and Reduce care partner burden.  Equipment: No equipment provided  Reasons for discharge: treatment goals met and discharge from hospital  Patient/family agrees with progress made and goals achieved: Yes  OT Discharge Precautions/Restrictions  Precautions Precautions: Fall Restrictions Weight Bearing Restrictions: No General   Pain Pain Assessment Pain Scale: 0-10 Pain Score: 0-No pain ADL ADL Eating: Supervision/safety Where Assessed-Eating: Bed level Grooming: Supervision/safety Where Assessed-Grooming: Edge of bed Upper Body Bathing: Supervision/safety Where Assessed-Upper Body Bathing: Edge of bed Lower Body Bathing: Supervision/safety Where Assessed-Lower Body Bathing: Edge of bed Upper Body Dressing: Supervision/safety Where Assessed-Upper Body Dressing: Edge of bed Lower Body Dressing: Supervision/safety Where Assessed-Lower Body Dressing: Edge of bed Toileting: Supervision/safety Where Assessed-Toileting: Bedside Commode Toilet Transfer: Minimal assistance Toilet Transfer Method: Stand pivot Science writer: Bedside  commode Tub/Shower Transfer: Metallurgist Method: Unable to assess Social research officer, government: Unable to assess Social research officer, government Method: Unable to assess Perception   Mild L inattention  Cognition Cognition Overall Cognitive Status: History of cognitive impairments - at baseline Arousal/Alertness: Awake/alert Orientation Level: Person;Place;Situation Person: Oriented Place: Disoriented ("[facility]") Situation: Disoriented Memory: Impaired Awareness: Impaired Problem Solving: Impaired Safety/Judgment: Impaired Brief Interview for Mental Status (BIMS) Repetition of Three Words (First Attempt): 3 Temporal Orientation: Year: Correct Temporal Orientation: Month: Missed by more than 1 month Temporal Orientation: Day: Correct Recall: "Sock": Yes, no cue required Recall: "Blue": Yes, after cueing ("a color") Recall: "Bed": No, could not recall BIMS Summary Score: 10 Sensation Coordination Gross Motor Movements are Fluid and Coordinated: No Fine Motor Movements are Fluid and Coordinated: No Finger Nose Finger Test: Continues to have L UE coordination deficits but improved since eval Motor  Motor Motor: Hemiplegia Motor - Discharge Observations: L side weakness continues but improved since eval Mobility  Bed Mobility Rolling Right: Supervision/verbal cueing Supine to Sit: Supervision/Verbal cueing Sitting - Scoot to Edge of Bed: Supervision/Verbal cueing Sit to Supine: Supervision/Verbal cueing Transfers Sit to Stand: Supervision/Verbal cueing Stand to Sit: Supervision/Verbal cueing  Trunk/Postural Assessment     Balance Static Sitting Balance Static Sitting - Balance Support: Feet supported Static Sitting - Level of Assistance: 6: Modified independent (Device/Increase time) Dynamic Sitting Balance Dynamic Sitting - Level of Assistance: 1: +1 Total assist;5: Stand by assistance Static Standing Balance Static Standing - Balance Support: During  functional activity Static Standing - Level of Assistance: 5: Stand by assistance Dynamic Standing Balance Dynamic Standing - Balance Support: During functional activity Dynamic Standing - Level of Assistance: 5: Stand by assistance Extremity/Trunk Assessment RUE Assessment RUE Assessment: Within Functional Limits LUE Assessment LUE Assessment: Exceptions to [facility] Active Range of Motion (AROM) Comments: 30 degrees shoulder FF, 2-/5, L inattention and coordination deficits impacting function, imprpoved since eval  Amanda Porter Doe 02/20/2023, 1:52 PM

## 2023-02-20 NOTE — Progress Notes (Signed)
Physical Therapy Session Note  Patient Details  Name: Amanda Porter MRN: [id number] Date of Birth: 05-11-1946  Today's Date: 02/20/2023 PT Individual Time: 0800-0845 PT Individual Time Calculation (min): 45 min   Short Term Goals: Week 1:  PT Short Term Goal 1 (Week 1): Pt will perform >170ft of gait w/ Min A using LRAD PT Short Term Goal 2 (Week 1): Pt will be CGA for transfers PT Short Term Goal 3 (Week 1): Pt will tolerate standing for >2 minutes  Skilled Therapeutic Interventions/Progress Updates:  Patient greeted supine in bed with RN present adminstering morning medications and agreeable to PT treatment session. Patient eventually transitioned from semi-reclined to sitting EOB with Supv and use of bed rail. While sitting EOB, patient washed her face and body with a washcloth and set-up assistance. Patient was then able to don deodorant and shirt with set-up assistance. Therapist threaded pants and donned socks and shoes for time management. Patient stood from EOB with use of RW and SBA- While standing patient pulled pants of hips with MinA from therapist for improved alignment of pants. Patient then gait trained x155' with RW and Supv- Patient demonstrated x1 moderate LOB requiring CGA and appropriate use of stepping strategy. Patient required extended seated rest break after gait trial secondary to reports of fatigue.   Patient performed x10 sit/stands with RW and Supv- VC for proper hand placement throughout with minimal carryover noted. Patient prefers to keep both hands on the walker during transitions.   Patient left sitting upright in wheelchair in room with posey belt on, call bell within reach and all needs met.    Therapy Documentation Precautions:  Precautions Precautions: Fall Precaution Comments: monitor HR & O2, L sided weakness Restrictions Weight Bearing Restrictions: No   Therapy/Group: Individual Therapy  Davonte Siebenaler 02/20/2023, 7:51 AM

## 2023-02-20 NOTE — Progress Notes (Signed)
Occupational Therapy Session Note  Patient Details  Name: Amanda Porter MRN: [id number] Date of Birth: [birth year]  Today's Date: 02/20/2023 OT Individual Time: 1303-1403 OT Individual Time Calculation (min): 60 min    Short Term Goals: Week 1:  OT Short Term Goal 1 (Week 1): Pt will complete 2/3 steps during toileting OT Short Term Goal 2 (Week 1): Pt will complete sit > stand with CGA using LRAD OT Short Term Goal 3 (Week 1): Pt will thread LB clothing with supervision using AE PRN  Skilled Therapeutic Interventions/Progress Updates:    Pt greeted semi-reclined in bed awake after eating lunch. She did not want to get up initially. OT utilized therapeitic use of self and was able to get pt to agree to get up to the wheelchair. Pt completed bed mobility with increased time and supervision. Pt sat EOB to don shoes. She needed increased time and multiple cues to recall what she was doing as she would take frequent breaks due to fatigue and forget what she was doing. Pt was able to get LE's into firgure 4 to tie shoes with cues to integrate L UE. Pt completed stand-pivot to wc with cues for safety and technique. OT provided pt with simple large beading task for bimanual fine motor task and to work on initiating use of L UE. Pt needed frequent cues to continue beading large beads as she would keep threading through over and over like she was tyling the beads on the string. Continued working on functional use of L UE with theraputty exercise. Pt left returned to bed w/ RW and close supervision. Pt left semi-reclined in bed with needs met.   Therapy Documentation Precautions:  Precautions Precautions: Fall Precaution Comments: monitor HR & O2, L sided weakness Restrictions Weight Bearing Restrictions: No Pain: Pain Assessment Pain Scale: 0-10 Pain Score: 0-No pain   Therapy/Group: Individual Therapy  Valma Cava 02/20/2023, 1:53 PM

## 2023-02-20 NOTE — Progress Notes (Signed)
Physical Therapy Discharge Summary  Patient Details  Name: Amanda Porter MRN: [id number] Date of Birth: 1946-07-03  Date of Discharge from PT service:February 22, 2023  Patient has met {[id number]} of 11 long term goals due to improved activity tolerance, improved balance, increased strength, ability to compensate for deficits, improved attention, improved awareness, and improved coordination.  Patient to discharge at an ambulatory level Supervision.   Patient's care partner is independent to provide the necessary cognitive assistance at discharge.  Reasons goals not met: ***  Recommendation:  Patient will benefit from ongoing skilled PT services in home health setting to continue to advance safe functional mobility, address ongoing impairments in dynamic stability, global strengthening, endurance/activity tolerance, safety awareness, L NMR, improved awareness/attention and minimize fall risk.  Equipment: No equipment provided- Patient owns RW  Reasons for discharge: treatment goals met and discharge from hospital  Patient/family agrees with progress made and goals achieved: Yes  PT Discharge Precautions/Restrictions Precautions Precautions: Fall Precaution Comments: monitor HR & O2, L sided weakness Restrictions Weight Bearing Restrictions: No Pain Interference Pain Interference Pain Effect on Sleep: 1. Rarely or not at all Pain Interference with Therapy Activities: 1. Rarely or not at all Pain Interference with Day-to-Day Activities: 1. Rarely or not at all Vision/Perception  Vision - History Ability to See in Adequate Light: 1 Impaired Vision - Assessment Additional Comments: Decreased attention to L side Perception Perception: Within Functional Limits Praxis Praxis: Intact  Cognition Overall Cognitive Status: History of cognitive impairments - at baseline Arousal/Alertness: Awake/alert Orientation Level: Oriented to person;Oriented to place Memory:  Impaired Awareness: Impaired Problem Solving: Impaired Safety/Judgment: Impaired Sensation Sensation Light Touch: Impaired by gross assessment Additional Comments: Reports numbness with diminished sensation in distal fingers Coordination Gross Motor Movements are Fluid and Coordinated: No Fine Motor Movements are Fluid and Coordinated: No Coordination and Movement Description: Continues to have L UE/LE coordination deficits but improved since eval Finger Nose Finger Test: Continues to have L UE coordination deficits but improved since eval Motor  Motor Motor: Hemiplegia Motor - Discharge Observations: L side weakness continues but improved since eval  Mobility Bed Mobility Bed Mobility: Rolling Right;Rolling Left;Supine to Sit;Sit to Supine Rolling Right: Independent with assistive device Rolling Left: Independent with assistive device Supine to Sit: Independent with assistive device Sitting - Scoot to Edge of Bed: Supervision/Verbal cueing Sit to Supine: Independent with assistive device Transfers Transfers: Sit to Stand;Stand Pivot Transfers;Stand to Sit Sit to Stand: Supervision/Verbal cueing Stand to Sit: Supervision/Verbal cueing Stand Pivot Transfers: Supervision/Verbal cueing Stand Pivot Transfer Details: Verbal cues for sequencing;Verbal cues for precautions/safety;Verbal cues for safe use of DME/AE;Verbal cues for technique Transfer (Assistive device): Rolling walker Locomotion  Gait Ambulation: Yes Gait Assistance: Supervision/Verbal cueing Gait Distance (Feet): 180 Feet Assistive device: Rolling walker Gait Assistance Details: Verbal cues for sequencing;Verbal cues for safe use of DME/AE Gait Gait: Yes Gait Pattern: Step-through pattern;Narrow base of support Gait velocity: decreased Stairs / Additional Locomotion Stairs: Yes Stairs Assistance: Supervision/Verbal cueing Stair Management Technique: Two rails Number of Stairs: 4 Height of Stairs: 6 Ramp:  Supervision/Verbal cueing Curb: Supervision/Verbal cueing Wheelchair Mobility Wheelchair Mobility: No  Trunk/Postural Assessment  Cervical Assessment Cervical Assessment: Within Functional Limits Thoracic Assessment Thoracic Assessment: Exceptions to [facility] (Rounded shoulders) Lumbar Assessment Lumbar Assessment: Exceptions to [facility] (Posterior pelvic tilt) Postural Control Postural Control: Deficits on evaluation Righting Reactions: Delayed, but improved since initial evaluation  Balance Balance Balance Assessed: Yes Static Sitting Balance Static Sitting - Balance Support: Feet supported Static Sitting -  Level of Assistance: 6: Modified independent (Device/Increase time) Dynamic Sitting Balance Dynamic Sitting - Balance Support: Feet supported;No upper extremity supported Dynamic Sitting - Level of Assistance: 5: Stand by assistance Static Standing Balance Static Standing - Balance Support: Bilateral upper extremity supported Static Standing - Level of Assistance: 5: Stand by assistance Dynamic Standing Balance Dynamic Standing - Balance Support: During functional activity;Bilateral upper extremity supported Dynamic Standing - Level of Assistance: 5: Stand by assistance Extremity Assessment  RUE Assessment RUE Assessment: Within Functional Limits LUE Assessment LUE Assessment: Exceptions to [facility] Active Range of Motion (AROM) Comments: 30 degrees shoulder FF, 2-/5, L inattention and coordination deficits impacting function, imprpoved since eval RLE Assessment RLE Assessment: Within Functional Limits General Strength Comments: Grossly 4/5 LLE Assessment LLE Assessment: Exceptions to [facility] General Strength Comments: Grossly 3+/5   Sydney  Rafoth 02/20/2023, 4:24 PM

## 2023-02-21 ENCOUNTER — Encounter (INDEPENDENT_AMBULATORY_CARE_PROVIDER_SITE_OTHER): Payer: Medicare PPO

## 2023-02-21 DIAGNOSIS — R339 Retention of urine, unspecified: Secondary | ICD-10-CM

## 2023-02-21 DIAGNOSIS — N3000 Acute cystitis without hematuria: Secondary | ICD-10-CM

## 2023-02-21 DIAGNOSIS — R519 Headache, unspecified: Secondary | ICD-10-CM

## 2023-02-21 LAB — URINALYSIS, ROUTINE W REFLEX MICROSCOPIC
Bilirubin Urine: NEGATIVE
Glucose, UA: NEGATIVE mg/dL
Hgb urine dipstick: NEGATIVE
Ketones, ur: NEGATIVE mg/dL
Leukocytes,Ua: NEGATIVE
Nitrite: NEGATIVE
Protein, ur: NEGATIVE mg/dL
Specific Gravity, Urine: 1.021 (ref 1.005–1.030)
pH: 6 (ref 5.0–8.0)

## 2023-02-21 LAB — GLUCOSE, CAPILLARY
Glucose-Capillary: 110 mg/dL — ABNORMAL HIGH (ref 70–99)
Glucose-Capillary: 209 mg/dL — ABNORMAL HIGH (ref 70–99)
Glucose-Capillary: 126 mg/dL — ABNORMAL HIGH (ref 70–99)
Glucose-Capillary: 126 mg/dL — ABNORMAL HIGH (ref 70–99)

## 2023-02-21 MED ORDER — BUTALBITAL-APAP-CAFFEINE 50-325-40 MG PO TABS
1.0000 | ORAL_TABLET | Freq: Four times a day (QID) | ORAL | Status: DC | PRN
  Administered 2023-02-21 – 2023-02-22 (×3): 1 via ORAL
  Filled 2023-02-21 (×3): qty 1

## 2023-02-21 NOTE — Progress Notes (Addendum)
Speech Language Pathology Daily Session Note  Patient Details  Name: Amanda Porter MRN: [id number] Date of Birth: July 07, 1946  Today's Date: 02/21/2023 SLP Individual Time: 1120-1200 SLP Individual Time Calculation (min): 40 min  Short Term Goals: Week 2: SLP Short Term Goal 1 (Week 2): STGs=LTGs due to ELOS  Skilled Therapeutic Interventions: Pt seen this date for skilled ST intervention targeting cognitive goals outlined in care plan. Pt received awake/alert and OOB in w/c. Agreeable to intervention in hospital room. Very forgetful; participatory with encouragement.   SLP facilitated today's session by providing anywhere from [place] to Max A for attention to task and task initiation (task dependent). Max A for recall of family members names (stated [name] is her sister when in the chart it states daughter) and Mod to Max A for therapeutic recall of fictional grocery list despite use of spaced retrieval, errorless learning, and written cue (recalled 4 out of 4 words on word list without delay, recalled only 3 out of 4 following 30 second to 1 minute delay, and no words following more than a 1 minute delay despite use of aforementioned skilled interventions - minimally stimulable). Total A to locate and utilize previously introduced [employer] - wrote activities completed during therapy sessions for pt and provided pt with education re: rationale of memory notebook and use of compensatory memory strategies (provided education on WRAP). Grossly oriented x 4 given Min-Mod A verbal and visual cues for use of external/written aids - on probe, unable to demonstrate orientation to date, location, nor situation without use of visual supports.  Pt verbally perseverative on loss of her memory and "when it would come back" x 4. Provided simplified education on ST POC and neuro recovery - reports some memory difficulty prior to admission. Redirected easily with Min A verbal cues. Attempted to engage pt in word  search (book in pt's room); however, required Max-Total A to participate and later states that she "has never liked word searches."   Pt left in room and OOB in w/c with all safety measures activated and call bell within reach. Continue per current ST POC.   Pain Reports 8 out of 10 headache pain by the end of today's session; RN notified and emotional support provided by this therapist.   Therapy/Group: Individual Therapy  Americus Perkey A Kimyatta Lecy 02/21/2023, 12:24 PM

## 2023-02-21 NOTE — Progress Notes (Signed)
PROGRESS NOTE   Subjective/Complaints:  No acute events overnight noted, no new complaints this morning. Later in the AM she reported HA to nursing, did not improve with tylenol.     ROS: +itching scalp - improved; + urinary incontinence  -ongoing denies fevers, chills, malaise, N/V, abdominal pain, constipation, diarrhea, SOB, cough, chest pain, new weakness or paraesthesias.   + HA  Objective:   No results found. No results for input(s): "WBC", "HGB", "HCT", "PLT" in the last 72 hours.  Recent Labs    02/20/23 0649  CREATININE 0.56     Intake/Output Summary (Last 24 hours) at 02/21/2023 1724 Last data filed at 02/21/2023 1300 Gross per 24 hour  Intake 594 ml  Output --  Net 594 ml         Physical Exam: Vital Signs Blood pressure 127/60, pulse 95, temperature 98.8 F (37.1 C), temperature source Oral, resp. rate 16, height 5\' 2"  (1.575 m), weight 36.5 kg, SpO2 100 %. Physical Exam:   Constitutional: No apparent distress. Appropriate appearance for age. Lying in bed, appears comfortable HENT: No JVD. Neck Supple. Trachea midline.  Eyes: PERRLA. EOMI. + mild L exotropia Cardiovascular: RRR, no murmurs/rub/gallops. No Edema. Peripheral pulses 2+  Respiratory: CTAB,+ Nasal cannula with 2 L., non-labored Abdomen: + bowel sounds, normoactive. No distention or tenderness.  GU: Not examined.   Skin: C/D/I. + erythema and excoriations on L posterior scalp - much improved  + Hemicrani site mostly well healed; open area from scratching on anterior incision line -slightly raised but improving, no further bleeding MSK:      No apparent deformity.      Strength: Slight left-sided weakness compared to right, otherwise antigravity and against resistance in all 4 extremities.   Neurologic exam:  Cognition: AAO to person; place with cues; not time -stable Language: Fluent, No substitutions or neoglisms. No dysarthria. Names  3/3 objects correctly  Memory: +deficits, severe; poor carryover Insight: Poor insight into current condition.  Mood: Flat affect, appropriate mood.  Sensation: To light touch intact in BL UEs and LEs  Reflexes: 1+ in BL UE and LEs. Negative Hoffman's and babinski signs bilaterally.  CN:+ L facial droop Coordination: + ataxia on FTN L>R.  Does grip and mobilize on command with left upper extremity - unchanged Tone: MAS 2 left elbow flexors, otherwise minimal in left upper extremity, absent left lower extremity. No paion with full ROM L elbow.    Assessment/Plan: 1. Functional deficits which require 3+ hours per day of interdisciplinary therapy in a comprehensive inpatient rehab setting. Physiatrist is providing close team supervision and 24 hour management of active medical problems listed below. Physiatrist and rehab team continue to assess barriers to discharge/monitor patient progress toward functional and medical goals  Care Tool:  Bathing    Body parts bathed by patient: Right arm, Left arm, Chest, Abdomen, Left upper leg, Face, Front perineal area, Buttocks, Right upper leg, Right lower leg, Left lower leg   Body parts bathed by helper: Front perineal area, Buttocks, Right upper leg, Right lower leg, Left lower leg     Bathing assist Assist Level: Supervision/Verbal cueing     Upper Body Dressing/Undressing Upper  body dressing   What is the patient wearing?: Pull over shirt    Upper body assist Assist Level: Supervision/Verbal cueing    Lower Body Dressing/Undressing Lower body dressing      What is the patient wearing?: Pants     Lower body assist Assist for lower body dressing: Supervision/Verbal cueing     Toileting Toileting    Toileting assist Assist for toileting: Supervision/Verbal cueing     Transfers Chair/bed transfer  Transfers assist     Chair/bed transfer assist level: Supervision/Verbal cueing     Locomotion Ambulation   Ambulation  assist      Assist level: Supervision/Verbal cueing Assistive device: Walker-rolling Max distance: 180'   Walk 10 feet activity   Assist     Assist level: Supervision/Verbal cueing Assistive device: Walker-rolling   Walk 50 feet activity   Assist    Assist level: Supervision/Verbal cueing Assistive device: Walker-rolling    Walk 150 feet activity   Assist Walk 150 feet activity did not occur: Safety/medical concerns  Assist level: Supervision/Verbal cueing Assistive device: Walker-rolling    Walk 10 feet on uneven surface  activity   Assist     Assist level: Supervision/Verbal cueing Assistive device: Walker-rolling   Wheelchair     Assist Is the patient using a wheelchair?: Yes Type of Wheelchair: Manual    Wheelchair assist level: Total Assistance - Patient < 25% Max wheelchair distance: 261ft    Wheelchair 50 feet with 2 turns activity    Assist        Assist Level: Total Assistance - Patient < 25%   Wheelchair 150 feet activity     Assist      Assist Level: Total Assistance - Patient < 25%   Blood pressure 127/60, pulse 95, temperature 98.8 F (37.1 C), temperature source Oral, resp. rate 16, height 5\' 2"  (1.575 m), weight 36.5 kg, SpO2 100 %.  Medical Problem List and Plan: 1. Functional deficits secondary to nontraumatic brain injury with SDH status post right frontal burr hole 02/03/2023  - Other pertinent Hx: VP shunt removed, replacement for NPH approximately 5 months ago followed by neurosurgery Dr. Meade Maw; Hx CVA with left-sided residual weakness.             -patient may shower             -ELOS/Goals: 10-14 days, SPV PT/OT/SLP - goal DC 3/15  -Placed 15/7 due to poor therapy tolerance  - 3/22: Daughter requested extension of goal discharge date, after discussion with all therapies feel that she is doing extremely well and has maximized the benefit of inpatient rehab therapies.  Additional time would not  benefit the patient.  Attempted to call the daughter x 2 to inform her of decision not to extend length of stay, unsuccessful in contacting.  Will reattempt tonight.  3/23 called daughter discussed planned DC date  2.  Antithrombotics: -DVT/anticoagulation:  Pharmaceutical: Lovenox initiated 02/05/2023             -antiplatelet therapy: Aspirin 81 mg daily 3. Pain Management: Tylenol as needed  4. Mood/Behavior/Sleep/anxiety/depression: Wellbutrin 300 mg daily, BuSpar 30 mg twice daily, Namenda 5 mg twice daily, melatonin 10 mg nightly             -antipsychotic agents: N/A 5. Neuropsych/cognition: This patient is not capable of making decisions on her own behalf. 6. Skin/Wound Care: Routine skin checks   - 3/16-17: Benadryll to scalp BID. Nursing order to keep open areas covered to  prevent further itching, excoriations and potential infection   - 3/19: Reinforced to nursing need to cover excoriated areas, open area appears inflamed but not currently infected.  Patient was very poor cognitive carryover, unlikely to follow commands to stop itching surgical site - improved appearance   -Benadryl cream to posterior head rash twice daily.  Patient has allergy to triple antibiotic ointment.  7. Fluids/Electrolytes/Nutrition: Routine in and outs with follow-up chemistries -Admission labs stable, monitor.  8.  Chronic COPD without exacerbation as well as history of lung cancer status post right upper lobe lobectomy, former smoker.  Supplemental oxygen as needed.  Continue inhalers as directed - 3/16: Discussed with family, patient on continuous oxygen 2 to 3 L at home, significant respiratory history with no cognitive deficits.  Daughter is concerned that as needed oxygen will confuse her, and she will get used to removing it, which will make it more difficult to manage at home and is something she is witnessed already today.  This is a reasonable consideration, replaced oxygen orders with continuous  with goal sats 89 to 97%. - 3/17: Satting 90-100% on RA intermittently; had discussed with caught risks of high oxygen sat for chronic lung dz yesterday, may need to re-engage this week - 3/19: Discussed keeping her oxygen on her and adjusting rate with therapies, nursing.  9.  Hyperlipidemia.  Crestor 10.  Hypertension.  Currently on Norvasc 2.5 mg daily.  Blood pressure has been soft losartan currently on hold.  - well controlled; monitor -3/23 well controlled, continue current regimen    02/21/2023    7:24 PM 02/21/2023    2:05 PM 02/21/2023    5:30 AM  Vitals with BMI  Systolic [id number]  Diastolic 59 60 56  Pulse 87 95 86     11.  Diabetes mellitus.  Hemoglobin A1c 6.8.  SSI/Glucotrol 2.5 mg daily Recent Labs    02/21/23 0558 02/21/23 1144 02/21/23 1619  GLUCAP 110* 209* 126*     - 3/17: Mildly high but overall controlled; monitor  -3/18 overall controlled, continue to monitor   - 3/20: Some intermittent elevated blood glucose into 200, however intermittent lows as well, last A1c 6.8, would not further adjust Glipizide 2/23 continue current regimen, stable  12.  Chronic anemia.  Continue iron supplement.  Follow-up CBC  -3/18 recheck ordered for tomorrow, does not appear it was completed today   - 319 -hemoglobin 8.3, stable, monitor 13.  Recurrent UTIs.  Monitor for any dysuria or hematuria  - 3/20: Daughter feels patient is increasingly confused, states this is a common symptom of UTI for her, will check urinalysis today. - 3/21: UA +, awaiting culture, start on Kelfex 500 mg BID for 5 days. Once finished, may reculture, as patient is asymptomatic aside form cognitive deficits not apporeciated by therapies and may be simply colonized. - 3/22: Awaiting urinary culture, remains afebrile, asymptomatic aside from generalized symptoms endorsed by daughter (urinary incontinence, increased confusion). -3/23 daughter still very concerned regarding UTI, reports when she has had  Keflex in the past the doctors have had to change it after the cultures came back.  Daughter thinks pt would not share symptoms with team if she had dysuria. Will repeat urinalysis today-appears improved, no WBCs no nitrates  14.  Right THA 09/07/2022.  Weightbearing as tolerated 15.  Nose colonized with MRSA.  Bactroban as indicated 16.  GERD.  Protonix 17. Leukocytosis -resolved 3/19  18.  Left-sided hemiparesis, from prior stroke, with spasticity at  the left elbow.  Does not cause pain, patient able to functionally attend to the left side, and feel that antispasticity medications would affect her cognition more than would benefit.  Can discuss bracing, but will monitor for now.  - 3/21: Adding PRN baclofen 20 mg for spasticity; will not schedule d/t cognitive effects 19. Headache  -2/23 later in the afternoon nursing reported she is complaining of headache, will start Fioricet as needed. LOS: 8 days A FACE TO FACE EVALUATION WAS PERFORMED  Jennye Boroughs 02/21/2023, 5:24 PM

## 2023-02-21 NOTE — Progress Notes (Signed)
Physical Therapy Session Note  Patient Details  Name: Amanda Porter MRN: [id number] Date of Birth: [birth year]  Today's Date: 02/21/2023 PT Individual Time: 0820-0905 PT Individual Time Calculation (min): 45 min   Short Term Goals: Week 1:  PT Short Term Goal 1 (Week 1): Pt will perform >153ft of gait w/ Min A using LRAD PT Short Term Goal 2 (Week 1): Pt will be CGA for transfers PT Short Term Goal 3 (Week 1): Pt will tolerate standing for >2 minutes  Skilled Therapeutic Interventions/Progress Updates:     Pt received sidelying in bed and agrees to therapy. Reports headache. PT provides rest breaks as needed to manage pain and alerts RN to headache. Pt performs sidelying to sitting with extra time required and cues for hand placement and sequencing. PT assists to don shoes while pt is seated aty EOB and taking morning meds. Pt performs sit to stand with RW and cues for initiation. Pt ambulates around bed to [facility] with RW and close supervision with cues for safe AD management. Pt sits in WC and brushes teeth, with PT providing cues for pursed lip breathing to optimize respiratory status. WC transport to gym for time management. Pt requests to eat breakfast. PT positions pt at high low table and pt tasked with standing while eating breakfast to work on standing balance, activity tolerance, and coordination. Pt requires frequent rest breaks but able to perform multiple bouts of standing with close supervision and cues for energy conservation and posture. WC transport back to room. Pt left seated iN WC with all needs within reach.    Therapy Documentation Precautions:  Precautions Precautions: Fall Precaution Comments: monitor HR & O2, L sided weakness Restrictions Weight Bearing Restrictions: No   Therapy/Group: Individual Therapy  Breck Coons, PT, DPT 02/21/2023, 3:47 PM

## 2023-02-21 NOTE — Progress Notes (Signed)
Occupational Therapy Session Note  Patient Details  Name: Amanda Porter MRN: [id number] Date of Birth: 19-Apr-1946  Today's Date: 02/21/2023 OT Individual Time: 1220-1305 OT Individual Time Calculation (min): 45 min    Short Term Goals: Week 1:  OT Short Term Goal 1 (Week 1): Pt will complete 2/3 steps during toileting OT Short Term Goal 2 (Week 1): Pt will complete sit > stand with CGA using LRAD OT Short Term Goal 3 (Week 1): Pt will thread LB clothing with supervision using AE PRN  Skilled Therapeutic Interventions/Progress Updates:    Patient seen this day for OT services, patient indicated that she rested well last night and she had no complaints.  The pt indicated that she has a headache  with pain  response at  8 on a 0-10 scale. The pt indicated that she hadn't informed nursing, I indicated that I would inform nursing. The pt was able to complete sit to stands 3x from w/c LOF using the RW for additional balance with the gait belt in place and vc's for the position of her feet to improve her base of support at [facility].  The pt went on to complete UB exercises using the 1lb dowel for shld flexion, horizontal abduction, shld rotation, and large circles 1 set of 10 with rest breaks as needed, the pt took 3 rest breaks.  The pt indicated that she was hungry secondary to not eating her lunch, nursing was informed and indicated that they would order her a [place] sandwich.  The pt went on to  come from sit to stand at w/c LOF for removing suction pegs from the table top using the RW for additional balance.  The pt was instructed to widen her base of support by repositioning her feet for improvements in her static and dynamic balance. After verbal prompts, the pt was able to verbalize her location, in relation to the city and the facility, she went on to describe the objective of inpatient rehab once I informed her the location in the hospital. The pt remained at w/c LOF with her chair alarm activated,   eating her soup and sandwich.at the bedside table.   All additional needs were addressed prior to exiting the room.   Therapy Documentation Precautions:  Precautions Precautions: Fall Precaution Comments: monitor HR & O2, L sided weakness Restrictions Weight Bearing Restrictions: No  Therapy/Group: Individual Therapy  Yvonne Kendall 02/21/2023, 4:06 PM

## 2023-02-22 LAB — GLUCOSE, CAPILLARY
Glucose-Capillary: 123 mg/dL — ABNORMAL HIGH (ref 70–99)
Glucose-Capillary: 169 mg/dL — ABNORMAL HIGH (ref 70–99)
Glucose-Capillary: 172 mg/dL — ABNORMAL HIGH (ref 70–99)
Glucose-Capillary: 173 mg/dL — ABNORMAL HIGH (ref 70–99)

## 2023-02-22 MED ORDER — AMLODIPINE BESYLATE 2.5 MG PO TABS: 2.5000 mg | ORAL_TABLET | Freq: Every day | ORAL | 3 refills | Status: DC

## 2023-02-22 MED ORDER — ALBUTEROL SULFATE HFA 108 (90 BASE) MCG/ACT IN AERS: INHALATION_SPRAY | RESPIRATORY_TRACT | 12 refills | Status: DC

## 2023-02-22 MED ORDER — FERROUS SULFATE 325 (65 FE) MG PO TABS: 325.0000 mg | ORAL_TABLET | Freq: Every day | ORAL | 2 refills | Status: DC

## 2023-02-22 MED ORDER — BUPROPION HCL ER (XL) 300 MG PO TB24: 300.0000 mg | ORAL_TABLET | Freq: Every day | ORAL | 0 refills | Status: DC

## 2023-02-22 MED ORDER — BUTALBITAL-APAP-CAFFEINE 50-325-40 MG PO TABS: 1.0000 | ORAL_TABLET | Freq: Three times a day (TID) | ORAL | Status: DC | PRN

## 2023-02-22 MED ORDER — GLIPIZIDE ER 2.5 MG PO TB24: 2.5000 mg | ORAL_TABLET | Freq: Every day | ORAL | 0 refills | Status: DC

## 2023-02-22 MED ORDER — ACETAMINOPHEN 325 MG PO TABS: 650.0000 mg | ORAL_TABLET | Freq: Four times a day (QID) | ORAL | Status: DC | PRN

## 2023-02-22 MED ORDER — FOLIC ACID 1 MG PO TABS: 1.0000 mg | ORAL_TABLET | Freq: Every day | ORAL | 0 refills | Status: DC

## 2023-02-22 MED ORDER — ACETAMINOPHEN 325 MG PO TABS
650.0000 mg | ORAL_TABLET | Freq: Four times a day (QID) | ORAL | Status: DC | PRN
  Administered 2023-02-23: 650 mg via ORAL
  Filled 2023-02-22: qty 2

## 2023-02-22 NOTE — Progress Notes (Signed)
Speech Language Pathology Discharge Summary  Patient Details  Name: Amanda Porter MRN: [id number] Date of Birth: Oct 07, 1946  Date of Discharge from SLP service:February 22, 2023  Today's Date: 02/22/2023 SLP Individual Time: 0750-0825 SLP Individual Time Calculation (min): 35 min and Today's Date: 02/22/2023 SLP Missed Time: 25 Minutes Missed Time Reason: Pain;Patient unwilling to participate   Skilled Therapeutic Interventions:  Pt seen for skilled SLP session to address functional cognitive goals including attention, awareness, and working-memory. Pt reported fatigue and headache today, which impacted her ability to participate in full session. RN present at end of session to provide medications. Pt required max cues to reference memory book and review recent events. She is looking forward to d/c date, though demonstrated limited awareness of therapy progress this admission or plans once she is d/c'd home. Pt required min-moderate assistance for cognitive portion of  functional ADLs including teeth brushing and denture placement prior to breakfast. She was unaware that bottom dentures were not yet placed and needed redirection for placement. Set up completed for breakfast. Pt continued to complain of headache and required increased re-direction for participation in therapeutic activities; therefore, session was discontinued at this time. Pt left sitting upright in bed with bed alarm activated and RN present.   Patient has met 3 of 4 long term goals.  Patient to discharge at overall Mod level.  Reasons goals not met: requires more assistance to maintain attention to cognitive tasks that set goal   Clinical Impression/Discharge Summary:  Patient has demonstrated functional gains toward communication and swallowing goals meeting 3 of 4 long-term goals this admission. Pt did not meet attention goal due to amount of assistance set at minimum level and pt continues to require moderate assistance to sustain  attention on cognitive-linguistic tasks. Pt is currently communicating functional needs and completing cognitive tasks at the moderate-assistance level with mod A verbal cues to for accurate completion.  Patient education is complete and patient to discharge at overall moderate level. Patient's daughter was not present for final session, though she is independent to provide the necessary physical and cognitive-communication assistance at discharge. Patient would benefit from continued SLP services in home health setting to maximize independence on functional cognitive tasks and decrease caregiver burden.    Care Partner:  Caregiver Able to Provide Assistance: Yes  Type of Caregiver Assistance: Physical;Cognitive  Recommendation:  24 hour supervision/assistance;Home Health SLP  Rationale for SLP Follow Up: Reduce caregiver burden;Maximize cognitive function and independence   Equipment: n/a   Reasons for discharge: Discharged from hospital   Patient/Family Agrees with Progress Made and Goals Achieved: Yes    Wyn Forster 02/22/2023, 8:36 AM

## 2023-02-22 NOTE — Plan of Care (Signed)
  Problem: RH Balance Goal: LTG: Patient will maintain dynamic sitting balance (OT) Description: LTG:  Patient will maintain dynamic sitting balance with assistance during activities of daily living (OT) Outcome: Completed/Met Goal: LTG Patient will maintain dynamic standing with ADLs (OT) Description: LTG:  Patient will maintain dynamic standing balance with assist during activities of daily living (OT)  Outcome: Completed/Met   Problem: Sit to Stand Goal: LTG:  Patient will perform sit to stand in prep for activites of daily living with assistance level (OT) Description: LTG:  Patient will perform sit to stand in prep for activites of daily living with assistance level (OT) Outcome: Completed/Met   Problem: RH Bathing Goal: LTG Patient will bathe all body parts with assist levels (OT) Description: LTG: Patient will bathe all body parts with assist levels (OT) Outcome: Completed/Met   Problem: RH Dressing Goal: LTG Patient will perform upper body dressing (OT) Description: LTG Patient will perform upper body dressing with assist, with/without cues (OT). Outcome: Completed/Met Goal: LTG Patient will perform lower body dressing w/assist (OT) Description: LTG: Patient will perform lower body dressing with assist, with/without cues in positioning using equipment (OT) Outcome: Completed/Met   Problem: RH Toileting Goal: LTG Patient will perform toileting task (3/3 steps) with assistance level (OT) Description: LTG: Patient will perform toileting task (3/3 steps) with assistance level (OT)  Outcome: Completed/Met   Problem: RH Functional Use of Upper Extremity Goal: LTG Patient will use RT/LT upper extremity as a (OT) Description: LTG: Patient will use right/left upper extremity as a stabilizer/gross assist/diminished/nondominant/dominant level with assist, with/without cues during functional activity (OT) Outcome: Completed/Met   Problem: RH Toilet Transfers Goal: LTG Patient will  perform toilet transfers w/assist (OT) Description: LTG: Patient will perform toilet transfers with assist, with/without cues using equipment (OT) Outcome: Completed/Met   Problem: RH Tub/Shower Transfers Goal: LTG Patient will perform tub/shower transfers w/assist (OT) Description: LTG: Patient will perform tub/shower transfers with assist, with/without cues using equipment (OT) Outcome: Completed/Met   Problem: RH Memory Goal: LTG Patient will demonstrate ability for day to day recall/carry over during activities of daily living with assistance level (OT) Description: LTG:  Patient will demonstrate ability for day to day recall/carry over during activities of daily living with assistance level (OT). Outcome: Completed/Met

## 2023-02-22 NOTE — Plan of Care (Signed)
  Problem: RH Attention Goal: LTG Patient will demonstrate this level of attention during functional activites (SLP) Description: LTG:  Patient will will demonstrate this level of attention during functional activites (SLP) Outcome: Not Met (add Reason) Note: Pt required moderate-level of assistance to sustain attention on therapeutic tasks.    Problem: RH Cognition - SLP Goal: RH LTG Patient will demonstrate orientation with cues Description:  LTG:  Patient will demonstrate orientation to person/place/time/situation with cues (SLP)   Outcome: Completed/Met   Problem: RH Comprehension Communication Goal: LTG Patient will comprehend basic/complex auditory (SLP) Description: LTG: Patient will comprehend basic/complex auditory information with cues (SLP). Outcome: Completed/Met   Problem: RH Memory Goal: LTG Patient will use memory compensatory aids to (SLP) Description: LTG:  Patient will use memory compensatory aids to recall biographical/new, daily complex information with cues (SLP) Outcome: Completed/Met

## 2023-02-22 NOTE — Progress Notes (Signed)
PROGRESS NOTE   Subjective/Complaints:  No new clinical complaints or concerns today.  Denies any headache currently.  Denies dysuria or any other bladder symptoms.  Daughter asks if labs can be checked tomorrow before she goes.   ROS: +itching scalp - improved; + urinary incontinence  -ongoing denies fevers, chills, malaise, N/V, abdominal pain, constipation, diarrhea, SOB, cough, chest pain, new weakness or paraesthesias.   + HA-improved  Objective:   No results found. No results for input(s): "WBC", "HGB", "HCT", "PLT" in the last 72 hours.  Recent Labs    02/20/23 0649  CREATININE 0.56     Intake/Output Summary (Last 24 hours) at 02/22/2023 1723 Last data filed at 02/22/2023 1300 Gross per 24 hour  Intake 777 ml  Output 250 ml  Net 527 ml         Physical Exam: Vital Signs Blood pressure (!) 119/53, pulse 83, temperature 98.2 F (36.8 C), temperature source Oral, resp. rate 16, height 5\' 2"  (1.575 m), weight 36 kg, SpO2 100 %. Physical Exam:   Constitutional: No apparent distress. Appropriate appearance for age. Lying in bed, appears comfortable HENT: No JVD. Neck Supple. Trachea midline.  MMM Eyes: PERRLA.  Conjugate gaze + mild L exotropia Cardiovascular: RRR, no murmurs/rub/gallops. No Edema. Peripheral pulses 2+  Respiratory: CTAB,+ Nasal cannula with 2 L., non-labored Abdomen: + bowel sounds, normoactive. No distention or tenderness.  GU: Not examined.   Skin: C/D/I. + erythema and excoriations on L posterior scalp - much improved  + Hemicrani site mostly well healed; open area from scratching on anterior incision line -slightly raised but improving, no further bleeding MSK:      No apparent deformity.      Strength: Slight left-sided weakness compared to right, otherwise antigravity and against resistance in all 4 extremities.   Neurologic exam:  Cognition: AAO to person; place with cues; not time  -stable Language: Fluent, No substitutions or neoglisms. No dysarthria. Names 3/3 objects correctly  Memory: +deficits, severe; poor carryover Insight: Poor insight into current condition.  Mood: Appropriate, pleasant Sensation: To light touch intact in BL UEs and LEs  Reflexes: 1+ in BL UE and LEs. Negative Hoffman's and babinski signs bilaterally.  CN:+ L facial droop Coordination: + ataxia on FTN L>R.  Does grip and mobilize on command with left upper extremity - unchanged Tone: MAS 2 left elbow flexors, otherwise minimal in left upper extremity, absent left lower extremity. No paion with full ROM L elbow.    Assessment/Plan: 1. Functional deficits which require 3+ hours per day of interdisciplinary therapy in a comprehensive inpatient rehab setting. Physiatrist is providing close team supervision and 24 hour management of active medical problems listed below. Physiatrist and rehab team continue to assess barriers to discharge/monitor patient progress toward functional and medical goals  Care Tool:  Bathing    Body parts bathed by patient: Right arm, Left arm, Chest, Abdomen, Left upper leg, Face, Front perineal area, Buttocks, Right upper leg, Right lower leg, Left lower leg   Body parts bathed by helper: Front perineal area, Buttocks, Right upper leg, Right lower leg, Left lower leg     Bathing assist Assist Level: Supervision/Verbal cueing  Upper Body Dressing/Undressing Upper body dressing   What is the patient wearing?: Pull over shirt    Upper body assist Assist Level: Supervision/Verbal cueing    Lower Body Dressing/Undressing Lower body dressing      What is the patient wearing?: Pants     Lower body assist Assist for lower body dressing: Supervision/Verbal cueing     Toileting Toileting    Toileting assist Assist for toileting: Supervision/Verbal cueing     Transfers Chair/bed transfer  Transfers assist     Chair/bed transfer assist level:  Supervision/Verbal cueing     Locomotion Ambulation   Ambulation assist      Assist level: Contact Guard/Touching assist Assistive device: Walker-rolling Max distance: 150   Walk 10 feet activity   Assist     Assist level: Supervision/Verbal cueing Assistive device: Walker-rolling   Walk 50 feet activity   Assist    Assist level: Supervision/Verbal cueing Assistive device: Walker-rolling    Walk 150 feet activity   Assist Walk 150 feet activity did not occur: Safety/medical concerns  Assist level: Contact Guard/Touching assist Assistive device: Walker-rolling    Walk 10 feet on uneven surface  activity   Assist     Assist level: Supervision/Verbal cueing Assistive device: Walker-rolling   Wheelchair     Assist Is the patient using a wheelchair?: Yes Type of Wheelchair: Manual    Wheelchair assist level: Minimal Assistance - Patient > 75% Max wheelchair distance: 150    Wheelchair 50 feet with 2 turns activity    Assist        Assist Level: Minimal Assistance - Patient > 75%   Wheelchair 150 feet activity     Assist      Assist Level: Total Assistance - Patient < 25%   Blood pressure (!) 119/53, pulse 83, temperature 98.2 F (36.8 C), temperature source Oral, resp. rate 16, height 5\' 2"  (1.575 m), weight 36 kg, SpO2 100 %.  Medical Problem List and Plan: 1. Functional deficits secondary to nontraumatic brain injury with SDH status post right frontal burr hole 02/03/2023  - Other pertinent Hx: VP shunt removed, replacement for NPH approximately 5 months ago followed by neurosurgery Dr. Meade Maw; Hx CVA with left-sided residual weakness.             -patient may shower             -ELOS/Goals: 10-14 days, SPV PT/OT/SLP   -Placed 15/7 due to poor therapy tolerance  - 3/22: Daughter requested extension of goal discharge date, after discussion with all therapies feel that she is doing extremely well and has maximized the  benefit of inpatient rehab therapies.  Additional time would not benefit the patient.  Attempted to call the daughter x 2 to inform her of decision not to extend length of stay, unsuccessful in contacting.  Will reattempt tonight.  3/23 called daughter discussed planned DC date  -Expected discharge 02/23/2023  2.  Antithrombotics: -DVT/anticoagulation:  Pharmaceutical: Lovenox initiated 02/05/2023             -antiplatelet therapy: Aspirin 81 mg daily 3. Pain Management: Tylenol as needed  4. Mood/Behavior/Sleep/anxiety/depression: Wellbutrin 300 mg daily, BuSpar 30 mg twice daily, Namenda 5 mg twice daily, melatonin 10 mg nightly             -antipsychotic agents: N/A 5. Neuropsych/cognition: This patient is not capable of making decisions on her own behalf. 6. Skin/Wound Care: Routine skin checks   - 3/16-17: Benadryll to scalp BID.  Nursing order to keep open areas covered to prevent further itching, excoriations and potential infection   - 3/19: Reinforced to nursing need to cover excoriated areas, open area appears inflamed but not currently infected.  Patient was very poor cognitive carryover, unlikely to follow commands to stop itching surgical site - improved appearance   -Benadryl cream to posterior head rash twice daily.  Patient has allergy to triple antibiotic ointment.  7. Fluids/Electrolytes/Nutrition: Routine in and outs with follow-up chemistries -Admission labs stable, monitor.  8.  Chronic COPD without exacerbation as well as history of lung cancer status post right upper lobe lobectomy, former smoker.  Supplemental oxygen as needed.  Continue inhalers as directed - 3/16: Discussed with family, patient on continuous oxygen 2 to 3 L at home, significant respiratory history with no cognitive deficits.  Daughter is concerned that as needed oxygen will confuse her, and she will get used to removing it, which will make it more difficult to manage at home and is something she is  witnessed already today.  This is a reasonable consideration, replaced oxygen orders with continuous with goal sats 89 to 97%. - 3/17: Satting 90-100% on RA intermittently; had discussed with caught risks of high oxygen sat for chronic lung dz yesterday, may need to re-engage this week - 3/19: Discussed keeping her oxygen on her and adjusting rate with therapies, nursing.  9.  Hyperlipidemia.  Crestor 10.  Hypertension.  Currently on Norvasc 2.5 mg daily.  Blood pressure has been soft losartan currently on hold.  - well controlled; monitor -3/24 a little soft but overall under control    02/22/2023    1:00 PM 02/22/2023    4:41 AM 02/21/2023    7:24 PM  Vitals with BMI  Weight  79 lbs 6 oz   BMI  0000000   Systolic [id number]  Diastolic 53 58 59  Pulse 83 81 87     11.  Diabetes mellitus.  Hemoglobin A1c 6.8.  SSI/Glucotrol 2.5 mg daily Recent Labs    02/22/23 0558 02/22/23 1205 02/22/23 1609  GLUCAP 123* 169* 172*     - 3/17: Mildly high but overall controlled; monitor  -3/18 overall controlled, continue to monitor   - 3/20: Some intermittent elevated blood glucose into 200, however intermittent lows as well, last A1c 6.8, would not further adjust Glipizide 2/24 controlled overall continue current regimen  12.  Chronic anemia.  Continue iron supplement.  Follow-up CBC  -3/18 recheck ordered for tomorrow, does not appear it was completed today   - 319 -hemoglobin 8.3, stable, monitor 13.  Recurrent UTIs.  Monitor for any dysuria or hematuria  - 3/20: Daughter feels patient is increasingly confused, states this is a common symptom of UTI for her, will check urinalysis today. - 3/21: UA +, awaiting culture, start on Kelfex 500 mg BID for 5 days. Once finished, may reculture, as patient is asymptomatic aside form cognitive deficits not apporeciated by therapies and may be simply colonized. - 3/22: Awaiting urinary culture, remains afebrile, asymptomatic aside from generalized  symptoms endorsed by daughter (urinary incontinence, increased confusion). -3/23 daughter still very concerned regarding UTI, reports when she has had Keflex in the past the doctors have had to change it after the cultures came back.  Daughter thinks pt would not share symptoms with team if she had dysuria. Will repeat urinalysis today-appears improved, no WBCs no nitrates -3/24 repeat UA with no WBC, negative nitrate, negative leukocyte, indicates improving UTI 14.  Right  THA 09/07/2022.  Weightbearing as tolerated 15.  Nose colonized with MRSA.  Bactroban as indicated 16.  GERD.  Protonix 17. Leukocytosis -resolved 3/19  -Recheck CBC tomorrow  18.  Left-sided hemiparesis, from prior stroke, with spasticity at the left elbow.  Does not cause pain, patient able to functionally attend to the left side, and feel that antispasticity medications would affect her cognition more than would benefit.  Can discuss bracing, but will monitor for now.  - 3/21: Adding PRN baclofen 20 mg for spasticity; will not schedule d/t cognitive effects 19. Headache  -2/23 later in the afternoon nursing reported she is complaining of headache, will start Fioricet as needed.  -2/24 she reports this is improved today.  LOS: 9 days A FACE TO FACE EVALUATION WAS PERFORMED  Jennye Boroughs 02/22/2023, 5:23 PM

## 2023-02-22 NOTE — Plan of Care (Signed)
  Problem: RH Balance Goal: LTG Patient will maintain dynamic sitting balance (PT) Description: LTG:  Patient will maintain dynamic sitting balance with assistance during mobility activities (PT) Outcome: Adequate for Discharge Goal: LTG Patient will maintain dynamic standing balance (PT) Description: LTG:  Patient will maintain dynamic standing balance with assistance during mobility activities (PT) Outcome: Adequate for Discharge   Problem: Sit to Stand Goal: LTG:  Patient will perform sit to stand with assistance level (PT) Description: LTG:  Patient will perform sit to stand with assistance level (PT) Outcome: Adequate for Discharge   Problem: RH Ambulation Goal: LTG Patient will ambulate in controlled environment (PT) Description: LTG: Patient will ambulate in a controlled environment, # of feet with assistance (PT). Outcome: Adequate for Discharge Goal: LTG Patient will ambulate in community environment (PT) Description: LTG: Patient will ambulate in community environment, # of feet with assistance (PT). Outcome: Adequate for Discharge   Problem: RH Stairs Goal: LTG Patient will ambulate up and down stairs w/assist (PT) Description: LTG: Patient will ambulate up and down # of stairs with assistance (PT) Outcome: Adequate for Discharge   Problem: RH Bed Mobility Goal: LTG Patient will perform bed mobility with assist (PT) Description: LTG: Patient will perform bed mobility with assistance, with/without cues (PT). Outcome: Completed/Met   Problem: RH Bed to Chair Transfers Goal: LTG Patient will perform bed/chair transfers w/assist (PT) Description: LTG: Patient will perform bed to chair transfers with assistance (PT). Outcome: Completed/Met   Problem: RH Car Transfers Goal: LTG Patient will perform car transfers with assist (PT) Description: LTG: Patient will perform car transfers with assistance (PT). Outcome: Completed/Met   Problem: RH Ambulation Goal: LTG Patient  will ambulate in home environment (PT) Description: LTG: Patient will ambulate in home environment, # of feet with assistance (PT). Outcome: Completed/Met

## 2023-02-22 NOTE — Progress Notes (Signed)
Patient has top dentures in place, attempted to ask patient to remove top dentures and put them in container with bottom dentures to comply with all signs in the room and so they would not be lost, patient refused. Bottom dentures are currently in pink container and top dentures in patient's mouth.

## 2023-02-22 NOTE — Progress Notes (Signed)
Physical Therapy Session Note  Patient Details  Name: Amanda Porter MRN: [id number] Date of Birth: 03/15/1946  Today's Date: 02/22/2023 PT Individual Time: 1115-1205 PT Individual Time Calculation (min): 50 min   Short Term Goals: Week 1:  PT Short Term Goal 1 (Week 1): Pt will perform >188ft of gait w/ Min A using LRAD PT Short Term Goal 2 (Week 1): Pt will be CGA for transfers PT Short Term Goal 3 (Week 1): Pt will tolerate standing for >2 minutes  Skilled Therapeutic Interventions/Progress Updates:  Pt was seen bedside in the am. Pt initially reluctant to participate but agreed with encouragement. Pt performed bed mobility with S. Pt performed all transfers sit to stand, stand pivot and car transfers with rolling walker and S with verbal cues. Pt ambulated between 50 to 75 feet with rolling walker and S. As ambulation distance increased and pt fatigue up to 150 feet, pt required increased verbal cues and c/g. Pt ascended/descended 4 stairs with B rails and S to c/g. Pt propelled w/c about 50 feet with min A and verbal cues. Pt returned to room following treatment. Pt left sitting up in bed with nurse tech at bedside.   Therapy Documentation Precautions:  Precautions Precautions: Fall Precaution Comments: monitor HR & O2, L sided weakness Restrictions Weight Bearing Restrictions: No General:   Pain: Pt c/o headache.   Therapy/Group: Individual Therapy  Dub Amis 02/22/2023, 4:28 PM

## 2023-02-23 LAB — CBC
HCT: 27.3 % — ABNORMAL LOW (ref 36.0–46.0)
Hemoglobin: 8.3 g/dL — ABNORMAL LOW (ref 12.0–15.0)
MCH: 27 pg (ref 26.0–34.0)
MCHC: 30.4 g/dL (ref 30.0–36.0)
MCV: 88.9 fL (ref 80.0–100.0)
Platelets: 479 10*3/uL — ABNORMAL HIGH (ref 150–400)
RBC: 3.07 MIL/uL — ABNORMAL LOW (ref 3.87–5.11)
RDW: 16.2 % — ABNORMAL HIGH (ref 11.5–15.5)
WBC: 7.3 10*3/uL (ref 4.0–10.5)
nRBC: 0 % (ref 0.0–0.2)

## 2023-02-23 LAB — GLUCOSE, CAPILLARY: Glucose-Capillary: 132 mg/dL — ABNORMAL HIGH (ref 70–99)

## 2023-02-23 LAB — BASIC METABOLIC PANEL
Anion gap: 9 (ref 5–15)
BUN: 12 mg/dL (ref 8–23)
CO2: 28 mmol/L (ref 22–32)
Calcium: 9.2 mg/dL (ref 8.9–10.3)
Chloride: 103 mmol/L (ref 98–111)
Creatinine, Ser: 0.66 mg/dL (ref 0.44–1.00)
GFR, Estimated: >60 mL/min (ref >60)
Glucose, Bld: 127 mg/dL — ABNORMAL HIGH (ref 70–99)
Potassium: 3.9 mmol/L (ref 3.5–5.1)
Sodium: 140 mmol/L (ref 135–145)

## 2023-02-23 MED ORDER — CEPHALEXIN 500 MG PO CAPS: 500.0000 mg | ORAL_CAPSULE | Freq: Two times a day (BID) | ORAL | 0 refills | Status: DC

## 2023-02-23 MED ORDER — MEMANTINE HCL 5 MG PO TABS: 5.0000 mg | ORAL_TABLET | Freq: Two times a day (BID) | ORAL | 3 refills | Status: DC

## 2023-02-23 MED ORDER — BUSPIRONE HCL 30 MG PO TABS: 30.0000 mg | ORAL_TABLET | Freq: Two times a day (BID) | ORAL | 0 refills | Status: DC

## 2023-02-23 MED ORDER — MELATONIN 10 MG PO TABS: 10.0000 mg | ORAL_TABLET | Freq: Every day | ORAL | 0 refills | Status: DC

## 2023-02-23 MED ORDER — FLORANEX PO PACK: 1.0000 g | PACK | Freq: Every day | ORAL | 0 refills | Status: DC

## 2023-02-23 MED ORDER — BACLOFEN 5 MG PO TABS: 5.0000 mg | ORAL_TABLET | Freq: Every day | ORAL | 0 refills | Status: DC | PRN

## 2023-02-23 MED ORDER — BUTALBITAL-APAP-CAFFEINE 50-325-40 MG PO TABS: 1.0000 | ORAL_TABLET | Freq: Three times a day (TID) | ORAL | 0 refills | Status: DC | PRN

## 2023-02-23 MED ORDER — ADULT MULTIVITAMIN W/MINERALS CH: 1.0000 | ORAL_TABLET | Freq: Every day | ORAL | Status: DC

## 2023-02-23 MED ORDER — PANTOPRAZOLE SODIUM 40 MG PO TBEC: 40.0000 mg | DELAYED_RELEASE_TABLET | Freq: Every day | ORAL | 2 refills | Status: DC

## 2023-02-23 MED ORDER — ROSUVASTATIN CALCIUM 20 MG PO TABS: 20.0000 mg | ORAL_TABLET | Freq: Every evening | ORAL | 3 refills | Status: DC

## 2023-02-23 NOTE — Progress Notes (Signed)
PROGRESS NOTE   Subjective/Complaints:  NO events overnight; nursing reports some mild increased confusion consistent with her Dx dementia, but overall redirectable. Today, patient endorses wanting more blankets due to being cold in bed but no other complaints.  Feels prepared for discharge.   Appears decreased urinary frequency and some continent episodes documented over the weekend. Repeat UA improved; culture with <10k colonies. Labs this AM stable, no leukocytosis, plt downtrending, HgB stable.    ROS: +itching scalp - resolved; + urinary incontinence  - improved. denies fevers, chills, malaise, N/V, abdominal pain, constipation, diarrhea, SOB, cough, chest pain, new weakness or paraesthesias.   + HA-improved  Objective:   No results found. Recent Labs    02/23/23 0840  WBC 7.3  HGB 8.3*  HCT 27.3*  PLT 479*    Recent Labs    02/23/23 0840  NA 140  K 3.9  CL 103  CO2 28  GLUCOSE 127*  BUN 12  CREATININE 0.66  CALCIUM 9.2     Intake/Output Summary (Last 24 hours) at 02/23/2023 2330 Last data filed at 02/23/2023 0331 Gross per 24 hour  Intake 240 ml  Output --  Net 240 ml         Physical Exam: Vital Signs Blood pressure (!) 118/50, pulse 77, temperature 98 F (36.7 C), resp. rate 16, height 5\' 2"  (1.575 m), weight (S) 41.8 kg, SpO2 100 %. Physical Exam:   Constitutional: No apparent distress. Appropriate appearance for age. Lying in bed, appears comfortable HENT: No JVD. Neck Supple. Trachea midline.  MMM. +dentures  Eyes: PERRLA.  Conjugate gaze + mild L exotropia Cardiovascular: RRR, no murmurs/rub/gallops. No Edema. Peripheral pulses 2+  Respiratory: CTAB,+ Nasal cannula with 2 L., non-labored Abdomen: + bowel sounds, normoactive. No distention or tenderness.  GU: Not examined.   Skin: C/D/I. + erythema and excoriations on L posterior scalp, L jaw - much improved  + VPs/burr hole site mostly  well healed; open area from scratching on anterior incision line - closing, no further bleeding  MSK:      No apparent deformity.      Strength: LUE 4-5-/5, RUE 5-/5, LLE 5-/5, RLE 5/5   Neurologic exam:  Cognition: AAO to person; place as [place], not time with cues Language: Fluent, No substitutions or neoglisms. No dysarthria.  Memory: +deficits, severe; poor carryover Insight: Poor insight into current condition.  Mood: Appropriate, pleasant Sensation: To light touch intact in BL UEs and LEs  Reflexes: 1+ in BL UE and LEs. Negative Hoffman's and babinski signs bilaterally.  CN:+ L facial droop Coordination: + ataxia on FTN L>R -  mild Tone: MAS 2 left elbow flexors, otherwise minimal in left upper extremity, absent left lower extremity - unchanged.    Assessment/Plan: 1. Functional deficits which require 3+ hours per day of interdisciplinary therapy in a comprehensive inpatient rehab setting. Physiatrist is providing close team supervision and 24 hour management of active medical problems listed below. Physiatrist and rehab team continue to assess barriers to discharge/monitor patient progress toward functional and medical goals  Care Tool:  Bathing    Body parts bathed by patient: Right arm, Left arm, Chest, Abdomen, Left upper leg,  Face, Front perineal area, Buttocks, Right upper leg, Right lower leg, Left lower leg   Body parts bathed by helper: Front perineal area, Buttocks, Right upper leg, Right lower leg, Left lower leg     Bathing assist Assist Level: Supervision/Verbal cueing     Upper Body Dressing/Undressing Upper body dressing   What is the patient wearing?: Pull over shirt    Upper body assist Assist Level: Supervision/Verbal cueing    Lower Body Dressing/Undressing Lower body dressing      What is the patient wearing?: Pants     Lower body assist Assist for lower body dressing: Supervision/Verbal cueing     Toileting Toileting    Toileting  assist Assist for toileting: Supervision/Verbal cueing     Transfers Chair/bed transfer  Transfers assist     Chair/bed transfer assist level: Supervision/Verbal cueing     Locomotion Ambulation   Ambulation assist      Assist level: Contact Guard/Touching assist Assistive device: Walker-rolling Max distance: 150   Walk 10 feet activity   Assist     Assist level: Supervision/Verbal cueing Assistive device: Walker-rolling   Walk 50 feet activity   Assist    Assist level: Supervision/Verbal cueing Assistive device: Walker-rolling    Walk 150 feet activity   Assist Walk 150 feet activity did not occur: Safety/medical concerns  Assist level: Contact Guard/Touching assist Assistive device: Walker-rolling    Walk 10 feet on uneven surface  activity   Assist     Assist level: Supervision/Verbal cueing Assistive device: Walker-rolling   Wheelchair     Assist Is the patient using a wheelchair?: Yes Type of Wheelchair: Manual    Wheelchair assist level: Minimal Assistance - Patient > 75% Max wheelchair distance: 150    Wheelchair 50 feet with 2 turns activity    Assist        Assist Level: Minimal Assistance - Patient > 75%   Wheelchair 150 feet activity     Assist      Assist Level: Total Assistance - Patient < 25%   Blood pressure (!) 118/50, pulse 77, temperature 98 F (36.7 C), resp. rate 16, height 5\' 2"  (1.575 m), weight (S) 41.8 kg, SpO2 100 %.  Medical Problem List and Plan: 1. Functional deficits secondary to nontraumatic brain injury with SDH status post right frontal burr hole 02/03/2023  - Other pertinent Hx: VP shunt removed, replacement for NPH approximately 5 months ago followed by neurosurgery Dr. Meade Maw; Hx CVA with left-sided residual weakness.             -patient may shower             -ELOS/Goals: 10-14 days, [facility] PT/OT/SLP   -Placed 15/7 due to poor therapy tolerance  - 3/19 CT head showed  interval decrease in size of right-sided subdural collection and stable ventriculomegaly.  - 3/22: Daughter requested extension of goal discharge date, after discussion with all therapies feel that she is doing extremely well and has maximized the benefit of inpatient rehab therapies.  Additional time would not benefit the patient.  Attempted to call the daughter x 2 to inform her of decision not to extend length of stay, unsuccessful in contacting.  Will reattempt tonight.  3/23 called daughter discussed planned DC date  -Expected discharge 02/23/2023 - stable for discharge  2.  Antithrombotics: -DVT/anticoagulation:  Pharmaceutical: Lovenox initiated 02/05/2023             -antiplatelet therapy: Aspirin 81 mg daily 3. Pain Management:  Tylenol as needed  4. Mood/Behavior/Sleep/anxiety/depression: Wellbutrin 300 mg daily, BuSpar 30 mg twice daily, Namenda 5 mg twice daily, melatonin 10 mg nightly             -antipsychotic agents: N/A 5. Neuropsych/cognition: This patient is not capable of making decisions on her own behalf.    6. Skin/Wound Care: Routine skin checks   - 3/16-17: Benadryll to scalp BID. Nursing order to keep open areas covered to prevent further itching, excoriations and potential infection   - 3/19: Reinforced to nursing need to cover excoriated areas, open area appears inflamed but not currently infected.  Patient was very poor cognitive carryover, unlikely to follow commands to stop itching surgical site - improved appearance   -Benadryl cream to posterior head rash twice daily.  Patient has allergy to triple antibiotic ointment.  7. Fluids/Electrolytes/Nutrition: Routine in and outs with follow-up chemistries -Admission labs stable, monitor.  8.  Chronic COPD without exacerbation as well as history of lung cancer status post right upper lobe lobectomy, former smoker.  Supplemental oxygen as needed.  Continue inhalers as directed - 3/16: Discussed with family, patient on  continuous oxygen 2 to 3 L at home, significant respiratory history with no cognitive deficits.  Daughter is concerned that as needed oxygen will confuse her, and she will get used to removing it, which will make it more difficult to manage at home and is something she is witnessed already today.  This is a reasonable consideration, replaced oxygen orders with continuous with goal sats 89 to 97%. - 3/17: Satting 90-100% on RA intermittently; had discussed with caught risks of high oxygen sat for chronic lung dz yesterday, may need to re-engage this week - 3/19: Discussed keeping her oxygen on her and adjusting rate with therapies, nursing.  9.  Hyperlipidemia.  Crestor 10.  Hypertension.  Currently on Norvasc 2.5 mg daily.  Blood pressure has been soft losartan currently on hold.  - well controlled; monitor -3/24-25 a little soft but overall under controlled - continue current regimen    02/23/2023    7:00 AM 02/23/2023    3:22 AM 02/22/2023    8:11 PM  Vitals with BMI  Weight  92 lbs 2 oz    BMI  [id number]   Systolic [id number]  Diastolic 50 55 49  Pulse 77 91 84     Significant value      11.  Diabetes mellitus.  Hemoglobin A1c 6.8.  SSI/Glucotrol 2.5 mg daily Recent Labs    02/22/23 1609 02/22/23 2059 02/23/23 0634  GLUCAP 172* 173* 132*     - 3/17: Mildly high but overall controlled; monitor  -3/18 overall controlled, continue to monitor   - 3/20: Some intermittent elevated blood glucose into 200, however intermittent lows as well, last A1c 6.8, would not further adjust Glipizide 2/24 controlled overall continue current regimen  12.  Chronic anemia.  Continue iron supplement.  Follow-up CBC   - -hemoglobin 8-9 stable, monitor  13.  Recurrent UTIs.  Monitor for any dysuria or hematuria  - 3/20: Daughter feels patient is increasingly confused, states this is a common symptom of UTI for her, will check urinalysis today. - 3/21: UA +, awaiting culture, start on Kelfex 500 mg BID  for 5 days. Once finished, may reculture, as patient is asymptomatic aside form cognitive deficits not apporeciated by therapies and may be simply colonized. - 3/22: Awaiting urinary culture, remains afebrile, asymptomatic aside from generalized symptoms endorsed by daughter (urinary incontinence,  increased confusion).  -3/23 daughter still very concerned regarding UTI, reports when she has had Keflex in the past the doctors have had to change it after the cultures came back.  Daughter thinks pt would not share symptoms with team if she had dysuria. Will repeat urinalysis today-appears improved, no WBCs no nitrates  -3/24 repeat UA with no WBC, negative nitrate, negative leukocyte, indicates improving UTI   - 3/25 - some urinary continence documented over the weekend. Final culture with <10k colonies.   14.  Right THA 09/07/2022.  Weightbearing as tolerated 15.  Nose colonized with MRSA.  Bactroban as indicated 16.  GERD.  Protonix 17. Leukocytosis -resolved 3/19  -Recheck CBC tomorrow - stable 7   18.  Left-sided hemiparesis, from prior stroke, with spasticity at the left elbow.  Does not cause pain, patient able to functionally attend to the left side, and feel that antispasticity medications would affect her cognition more than would benefit.  Can discuss bracing, but will monitor for now.  - 3/21: Adding PRN baclofen 20 mg for spasticity; will not schedule d/t cognitive effects  19. Headache  -2/23 later in the afternoon nursing reported she is complaining of headache, will start Fioricet as needed.  -2/24 she reports this is improved today.   - 3/25 - no c/o HA  LOS: 10 days A FACE TO [place] 02/23/2023, 11:30 PM

## 2023-02-23 NOTE — Progress Notes (Signed)
Inpatient Rehabilitation Care Coordinator Discharge Note   Patient Details  Name: Amanda Porter MRN: [id number] Date of Birth: 03/27/1946   Discharge location: D/c to home  Length of Stay: 9 days  Discharge activity level: Supervision  Home/community participation: Limited  Patient response [id number] Literacy - How often do you need to have someone help you when you read instructions, pamphlets, or other written material from your doctor or pharmacy?: Always  Patient response [id number] Isolation - How often do you feel lonely or isolated from those around you?: Never  Services provided included: MD, RD, PT, OT, SLP, RN, CM, TR, Pharmacy, Neuropsych, SW  Financial Services:  Charity fundraiser Utilized: [employer]  Choices offered to/list presented to: daughter  Follow-up services arranged:  [place], Patient/Family request agency HH/DME [facility]: [facility] for HHPT/OT/SLP      HH/DME Requested Agency: [place]  Patient response to transportation need: Is the patient able to respond to transportation needs?: Yes In the past 12 months, has lack of transportation kept you from medical appointments or from getting medications?: No In the past 12 months, has lack of transportation kept you from meetings, work, or from getting things needed for daily living?: No   Comments (or additional information):  Patient/Family verbalized understanding of follow-up arrangements:  Yes  Individual responsible for coordination of the follow-up plan: contact pt dtr [name] [phone]  Confirmed correct DME delivered: Rana Snare 02/23/2023    Rana Snare

## 2023-02-23 NOTE — Progress Notes (Signed)
Patient ID: Amanda Porter, female   DOB: [birth year], 77 y.o.   MRN: [id number]  SW waiting on follow-up from [place] about HHPT/OT/SLP referral.   SW sent referral to [facility] and waiting on follow-up.   Referral accepted by [facility] for PT/OT/SLP.  *SW met with pt and pt dtr in room to review above. She appeared to be displeased, and states her mother is familiar with the this agency and the staff. SW shared if she is able to get an update from someone and the agency is able to accept, SW will address. SW informed to ensure her mother has appropriate services in place, we found an agency that was willing to accept. She was informed letter (approving her mother to return to day program) from medical doctor is pending at this time. She inquired about a RW and was told her mother should use a standard RW vs rollator. SW informed no knowledge of RW. SW informed her concerns will be shared with appropriate staff, and this SW will follow-up once there is more information.    Loralee Pacas, MSW, [place] Office: [phone] Cell: [phone] Fax: [phone]

## 2023-02-23 NOTE — Progress Notes (Signed)
Inpatient Rehabilitation Discharge Medication Review by a Pharmacist  A complete drug regimen review was completed for this patient to identify any potential clinically significant medication issues.  High Risk Drug Classes Is patient taking? Indication by Medication  Antipsychotic No   Anticoagulant No   Antibiotic Yes Po Cephalexin - UTI  Opioid No   Antiplatelet No   Hypoglycemics/insulin Yes Glipizide - DM  Vasoactive Medication Yes Amlodipine - BP  Chemotherapy No   Other Yes Baclofen - prn spasms Fioricet - prn headaches Albuterol, Stiolto - prn SOB Bupropion, buspirone - anxiety Memantine- memory Pantoprazole - reflux Rosuvastatin - HLD     Type of Medication Issue Identified Description of Issue Recommendation(s)  Drug Interaction(s) (clinically significant)     Duplicate Therapy     Allergy     No Medication Administration End Date     Incorrect Dose     Additional Drug Therapy Needed     Significant med changes from prior encounter (inform family/care partners about these prior to discharge).    Other       Clinically significant medication issues were identified that warrant physician communication and completion of prescribed/recommended actions by midnight of the next day:  No  Pharmacist comments: None  Time spent performing this drug regimen review (minutes):  30 minutes  Thank you Anette Guarneri, PharmD

## 2023-02-23 NOTE — Progress Notes (Addendum)
Given PRN pain medication for pain after cleaning patient and assisting with ADLS. Endorsing a headache. Slight increase in confusion through the night. Slight irritability with mood. Endorses being "nauseous" in their head. Daily weight obtained prior to putting blankets and additional items on the bed after finishing ADLS/changing patient's linens.

## 2023-02-24 ENCOUNTER — Telehealth: Payer: Self-pay

## 2023-02-24 ENCOUNTER — Encounter: Payer: Self-pay | Admitting: Physical Medicine and Rehabilitation

## 2023-02-24 ENCOUNTER — Telehealth: Payer: Self-pay | Admitting: *Deleted

## 2023-02-24 ENCOUNTER — Encounter: Payer: Self-pay | Admitting: Neurosurgery

## 2023-02-24 ENCOUNTER — Ambulatory Visit (INDEPENDENT_AMBULATORY_CARE_PROVIDER_SITE_OTHER): Payer: Medicare PPO | Admitting: Neurosurgery

## 2023-02-24 VITALS — BP 124/68 | HR 99 | Temp 97.7°F | Ht 62.0 in | Wt 92.4 lb

## 2023-02-24 DIAGNOSIS — G912 (Idiopathic) normal pressure hydrocephalus: Secondary | ICD-10-CM

## 2023-02-24 DIAGNOSIS — S065XAA Traumatic subdural hemorrhage with loss of consciousness status unknown, initial encounter: Secondary | ICD-10-CM

## 2023-02-24 DIAGNOSIS — Z09 Encounter for follow-up examination after completed treatment for conditions other than malignant neoplasm: Secondary | ICD-10-CM

## 2023-02-24 DIAGNOSIS — S065XAD Traumatic subdural hemorrhage with loss of consciousness status unknown, subsequent encounter: Secondary | ICD-10-CM

## 2023-02-24 DIAGNOSIS — F03918 Unspecified dementia, unspecified severity, with other behavioral disturbance: Secondary | ICD-10-CM

## 2023-02-24 NOTE — Telephone Encounter (Signed)
SW returned phone call to pt dtr [name], who reported that she saw the letter in the medical record for her mother to return so she had no concerns. SW shared that SW was able to clarify with PT and pt will need RW so item will be ordered and shipped to the home. She also reports she called [employer] office and had not received updates, so she is amenable to working with [employer]. No other questions/concerns reported.   SW order RW with [employer] via parachute requesting for item to be delivered. SW updated [facility] about amenable to staying with agency.   Case closed to SW.    Loralee Pacas, MSW, [place] Office: [phone] Cell: [phone] Fax: [phone]

## 2023-02-24 NOTE — Telephone Encounter (Signed)
Transition Care Management Unsuccessful Follow-up Telephone Call  Date of discharge and from where:  02/23/2023 from hospital  Attempts:  1st Attempt  Reason for unsuccessful TCM follow-up call:  Left voice message

## 2023-02-24 NOTE — Telephone Encounter (Signed)
[name] patient's daughter calling about a note she requested on yesterday upon patient's discharge from in-patient rehab. Patient needs a note stating she is cleared to return to adult day care.

## 2023-02-24 NOTE — Progress Notes (Signed)
   REFERRING PHYSICIAN:  Pleas Hardeep Reetz, [place],  [place] [address]  DOS: 02/03/23 right burr hole for SDH and removal of VP shunt   HISTORY OF PRESENT ILLNESS: Amanda Porter is out 3 weeks status post right bur hole for removal subdural and removal of VP shunt. Overall, she is doing postoperatively.  She presents today with her daughter [name].  She states that her cognitive status has continued to decline.  Her mother continues to complain of headaches which were present for many years preoperatively.  Endorses a small area on her incision with scabbing due to it being itchy and her mother scratching it.  PHYSICAL EXAMINATION:  NEUROLOGICAL:  General: In no acute distress.   Awake, alert, oriented to person  Pupils equal round and reactive to light.  Facial tone is symmetric.  Tongue protrusion is midline.  There is no pronator drift.  Mild left dysmetria  Strength: MAEW  Incision c/d/I all 1 cm area of very superficial dehiscence.  There are no signs of infection.   Imaging:  CT head 02/17/23 IMPRESSION: 1. Interval decrease in size of right-sided subdural collection, now measuring no more than 4 mm in maximal thickness. No significant mass effect or midline shift. 2. No other acute intracranial abnormality. 3. Ventriculomegaly, stable from prior. 4. Underlying atrophy with chronic microvascular ischemic disease.     Electronically Signed   By: Jeannine Boga M.D.   On: 02/17/2023 20:27  Assessment / Plan: Lavaughn A Crites is doing fair after washout of subdural hematoma and removal of VP shunt.  Daughter would like to get hospice involved outpatient.  Hide of care was involved inpatient and outpatient services were supposed to be established.  I will reach out to our hospice provider to hopefully get further information.  she will return to clinic in approximately 4 weeks to see Dr. [name].  Patient is to have a head CT prior to this.  Advised to  contact the office if any questions or concerns arise.   Cooper Render PA-C Dept of Neurosurgery

## 2023-02-24 NOTE — Telephone Encounter (Signed)
Confirmed Amanda Porter received note.

## 2023-02-24 NOTE — Addendum Note (Signed)
Addended by: Majel Homer on: 02/24/2023 11:04 AM   Modules accepted: Orders

## 2023-02-24 NOTE — Telephone Encounter (Signed)
415 pm.  Palliative Care referral received from PCP today.  Also noted a hospice referral today from neurology note.  Phone call made to daughter [name] to clarify need.  Will proceed with hospice evaluation given ongoing decline family is seeing in patient.  Daughter aware should patient not be eligible for hospice, Palliative Care can see patient.

## 2023-02-26 NOTE — Telephone Encounter (Addendum)
Patient location: Home in [place], [place] Provider location: Clinic in [place], [place]  I spoke with Amanda Porter via telephone today. [name] was admitted to hospice care today. Plan is to not replace VP shunt for NPH. Amanda Porter has been having difficulty with Amanda Porter not voiding frequently and resisting prompts to do so. She has I&O cathed her the past few days with residuals ~326mL.  We discussed that her dementia is likely contributing to her hesitation to void on demand. Unfortunately I do not have a pharmacotherapy option to address this, and I do not recommend Flomax due to the risk for increasing falls. I encouraged continued prompts to void as much as feasible. We discussed return precautions including difficulty/inability to void, dysuria, gross hematuria, or I&O residuals >442mL.   We also discussed consideration of Foley catheter for urinary management while approaching end of life, however I'm concerned about traumatic Foley pulls given patient's dementia. We agreed to defer this for now.  I spent 20 minutes on the day of the encounter to include pre-visit record review, face-to-face time with the patient, and post-visit ordering of tests.

## 2023-03-04 ENCOUNTER — Emergency Department (HOSPITAL_COMMUNITY)

## 2023-03-04 ENCOUNTER — Encounter (HOSPITAL_COMMUNITY): Payer: Self-pay

## 2023-03-04 ENCOUNTER — Inpatient Hospital Stay (HOSPITAL_COMMUNITY)
Admission: EM | Admit: 2023-03-04 | Discharge: 2023-03-06 | DRG: 884 | Disposition: A | Discharge status: Hospice | Attending: Internal Medicine | Admitting: Internal Medicine

## 2023-03-04 ENCOUNTER — Other Ambulatory Visit: Payer: Self-pay

## 2023-03-04 DIAGNOSIS — E876 Hypokalemia: Secondary | ICD-10-CM | POA: Diagnosis present

## 2023-03-04 DIAGNOSIS — G9341 Metabolic encephalopathy: Secondary | ICD-10-CM | POA: Diagnosis not present

## 2023-03-04 DIAGNOSIS — Z79899 Other long term (current) drug therapy: Secondary | ICD-10-CM

## 2023-03-04 DIAGNOSIS — R4781 Slurred speech: Secondary | ICD-10-CM | POA: Diagnosis not present

## 2023-03-04 DIAGNOSIS — K219 Gastro-esophageal reflux disease without esophagitis: Secondary | ICD-10-CM | POA: Diagnosis present

## 2023-03-04 DIAGNOSIS — J9611 Chronic respiratory failure with hypoxia: Secondary | ICD-10-CM | POA: Diagnosis not present

## 2023-03-04 DIAGNOSIS — E785 Hyperlipidemia, unspecified: Secondary | ICD-10-CM | POA: Diagnosis present

## 2023-03-04 DIAGNOSIS — D509 Iron deficiency anemia, unspecified: Secondary | ICD-10-CM | POA: Diagnosis present

## 2023-03-04 DIAGNOSIS — Z982 Presence of cerebrospinal fluid drainage device: Secondary | ICD-10-CM | POA: Diagnosis not present

## 2023-03-04 DIAGNOSIS — Z808 Family history of malignant neoplasm of other organs or systems: Secondary | ICD-10-CM

## 2023-03-04 DIAGNOSIS — R627 Adult failure to thrive: Secondary | ICD-10-CM

## 2023-03-04 DIAGNOSIS — R2681 Unsteadiness on feet: Secondary | ICD-10-CM

## 2023-03-04 DIAGNOSIS — R410 Disorientation, unspecified: Secondary | ICD-10-CM | POA: Diagnosis not present

## 2023-03-04 DIAGNOSIS — Z800 Family history of malignant neoplasm of digestive organs: Secondary | ICD-10-CM | POA: Diagnosis not present

## 2023-03-04 DIAGNOSIS — Z66 Do not resuscitate: Secondary | ICD-10-CM | POA: Diagnosis present

## 2023-03-04 DIAGNOSIS — I69321 Dysphasia following cerebral infarction: Secondary | ICD-10-CM | POA: Diagnosis not present

## 2023-03-04 DIAGNOSIS — R4182 Altered mental status, unspecified: Principal | ICD-10-CM | POA: Diagnosis present

## 2023-03-04 DIAGNOSIS — E1165 Type 2 diabetes mellitus with hyperglycemia: Secondary | ICD-10-CM | POA: Diagnosis present

## 2023-03-04 DIAGNOSIS — Z8701 Personal history of pneumonia (recurrent): Secondary | ICD-10-CM | POA: Diagnosis not present

## 2023-03-04 DIAGNOSIS — Z87891 Personal history of nicotine dependence: Secondary | ICD-10-CM

## 2023-03-04 DIAGNOSIS — R531 Weakness: Secondary | ICD-10-CM | POA: Diagnosis not present

## 2023-03-04 DIAGNOSIS — F0390 Unspecified dementia without behavioral disturbance: Principal | ICD-10-CM | POA: Diagnosis present

## 2023-03-04 DIAGNOSIS — Z8349 Family history of other endocrine, nutritional and metabolic diseases: Secondary | ICD-10-CM

## 2023-03-04 DIAGNOSIS — Z96641 Presence of right artificial hip joint: Secondary | ICD-10-CM | POA: Diagnosis present

## 2023-03-04 DIAGNOSIS — K5900 Constipation, unspecified: Secondary | ICD-10-CM | POA: Diagnosis present

## 2023-03-04 DIAGNOSIS — J449 Chronic obstructive pulmonary disease, unspecified: Secondary | ICD-10-CM

## 2023-03-04 DIAGNOSIS — Z681 Body mass index (BMI) 19 or less, adult: Secondary | ICD-10-CM | POA: Diagnosis not present

## 2023-03-04 DIAGNOSIS — I69391 Dysphagia following cerebral infarction: Secondary | ICD-10-CM | POA: Diagnosis not present

## 2023-03-04 DIAGNOSIS — R1310 Dysphagia, unspecified: Secondary | ICD-10-CM | POA: Diagnosis present

## 2023-03-04 DIAGNOSIS — I10 Essential (primary) hypertension: Secondary | ICD-10-CM | POA: Diagnosis present

## 2023-03-04 DIAGNOSIS — Z515 Encounter for palliative care: Secondary | ICD-10-CM | POA: Diagnosis not present

## 2023-03-04 DIAGNOSIS — Z7982 Long term (current) use of aspirin: Secondary | ICD-10-CM

## 2023-03-04 DIAGNOSIS — E860 Dehydration: Secondary | ICD-10-CM | POA: Diagnosis not present

## 2023-03-04 DIAGNOSIS — Z87440 Personal history of urinary (tract) infections: Secondary | ICD-10-CM

## 2023-03-04 DIAGNOSIS — G912 (Idiopathic) normal pressure hydrocephalus: Secondary | ICD-10-CM

## 2023-03-04 DIAGNOSIS — Z9981 Dependence on supplemental oxygen: Secondary | ICD-10-CM | POA: Diagnosis not present

## 2023-03-04 DIAGNOSIS — Z8249 Family history of ischemic heart disease and other diseases of the circulatory system: Secondary | ICD-10-CM

## 2023-03-04 DIAGNOSIS — Z9181 History of falling: Secondary | ICD-10-CM

## 2023-03-04 DIAGNOSIS — I69354 Hemiplegia and hemiparesis following cerebral infarction affecting left non-dominant side: Secondary | ICD-10-CM | POA: Diagnosis not present

## 2023-03-04 DIAGNOSIS — Z833 Family history of diabetes mellitus: Secondary | ICD-10-CM

## 2023-03-04 DIAGNOSIS — Z7189 Other specified counseling: Secondary | ICD-10-CM | POA: Diagnosis not present

## 2023-03-04 DIAGNOSIS — Z85118 Personal history of other malignant neoplasm of bronchus and lung: Secondary | ICD-10-CM

## 2023-03-04 DIAGNOSIS — Z7984 Long term (current) use of oral hypoglycemic drugs: Secondary | ICD-10-CM

## 2023-03-04 DIAGNOSIS — Z888 Allergy status to other drugs, medicaments and biological substances status: Secondary | ICD-10-CM

## 2023-03-04 DIAGNOSIS — Z881 Allergy status to other antibiotic agents status: Secondary | ICD-10-CM

## 2023-03-04 LAB — COMPREHENSIVE METABOLIC PANEL
ALT: 15 U/L (ref 0–44)
AST: 21 U/L (ref 15–41)
Albumin: 3.4 g/dL — ABNORMAL LOW (ref 3.5–5.0)
Alkaline Phosphatase: 49 U/L (ref 38–126)
Anion gap: 8 (ref 5–15)
BUN: 27 mg/dL — ABNORMAL HIGH (ref 8–23)
CO2: 29 mmol/L (ref 22–32)
Calcium: 9.3 mg/dL (ref 8.9–10.3)
Chloride: 106 mmol/L (ref 98–111)
Creatinine, Ser: 0.63 mg/dL (ref 0.44–1.00)
GFR, Estimated: >60 mL/min (ref >60)
Glucose, Bld: 132 mg/dL — ABNORMAL HIGH (ref 70–99)
Potassium: 3.8 mmol/L (ref 3.5–5.1)
Sodium: 143 mmol/L (ref 135–145)
Total Bilirubin: 0.2 mg/dL — ABNORMAL LOW (ref 0.3–1.2)
Total Protein: 7.5 g/dL (ref 6.5–8.1)

## 2023-03-04 LAB — CBC WITH DIFFERENTIAL/PLATELET
Abs Immature Granulocytes: 0.01 10*3/uL (ref 0.00–0.07)
Basophils Absolute: 0 10*3/uL (ref 0.0–0.1)
Basophils Relative: 1 %
Eosinophils Absolute: 0.1 10*3/uL (ref 0.0–0.5)
Eosinophils Relative: 2 %
HCT: 31.7 % — ABNORMAL LOW (ref 36.0–46.0)
Hemoglobin: 9.3 g/dL — ABNORMAL LOW (ref 12.0–15.0)
Immature Granulocytes: 0 %
Lymphocytes Relative: 33 %
Lymphs Abs: 2.8 10*3/uL (ref 0.7–4.0)
MCH: 26.7 pg (ref 26.0–34.0)
MCHC: 29.3 g/dL — ABNORMAL LOW (ref 30.0–36.0)
MCV: 91.1 fL (ref 80.0–100.0)
Monocytes Absolute: 0.8 10*3/uL (ref 0.1–1.0)
Monocytes Relative: 9 %
Neutro Abs: 4.7 10*3/uL (ref 1.7–7.7)
Neutrophils Relative %: 55 %
Platelets: 314 10*3/uL (ref 150–400)
RBC: 3.48 MIL/uL — ABNORMAL LOW (ref 3.87–5.11)
RDW: 15.7 % — ABNORMAL HIGH (ref 11.5–15.5)
WBC: 8.4 10*3/uL (ref 4.0–10.5)
nRBC: 0 % (ref 0.0–0.2)

## 2023-03-04 LAB — URINALYSIS, W/ REFLEX TO CULTURE (INFECTION SUSPECTED)
Bilirubin Urine: NEGATIVE
Glucose, UA: NEGATIVE mg/dL
Hgb urine dipstick: NEGATIVE
Ketones, ur: NEGATIVE mg/dL
Leukocytes,Ua: NEGATIVE
Nitrite: NEGATIVE
Protein, ur: NEGATIVE mg/dL
Specific Gravity, Urine: 1.019 (ref 1.005–1.030)
pH: 6 (ref 5.0–8.0)

## 2023-03-04 LAB — AMMONIA: Ammonia: 32 umol/L (ref 9–35)

## 2023-03-04 LAB — CBG MONITORING, ED: Glucose-Capillary: 113 mg/dL — ABNORMAL HIGH (ref 70–99)

## 2023-03-04 MED ORDER — PANTOPRAZOLE SODIUM 40 MG PO TBEC
40.0000 mg | DELAYED_RELEASE_TABLET | Freq: Every day | ORAL | Status: DC
  Administered 2023-03-05 – 2023-03-06 (×2): 40 mg via ORAL
  Filled 2023-03-04 (×2): qty 1

## 2023-03-04 MED ORDER — ACETAMINOPHEN 325 MG PO TABS
650.0000 mg | ORAL_TABLET | Freq: Four times a day (QID) | ORAL | Status: DC | PRN
  Administered 2023-03-06: 650 mg via ORAL
  Filled 2023-03-04: qty 2

## 2023-03-04 MED ORDER — ROSUVASTATIN CALCIUM 20 MG PO TABS
20.0000 mg | ORAL_TABLET | Freq: Every evening | ORAL | Status: DC
  Administered 2023-03-05: 20 mg via ORAL
  Filled 2023-03-04: qty 1

## 2023-03-04 MED ORDER — MELATONIN 5 MG PO TABS
10.0000 mg | ORAL_TABLET | Freq: Every day | ORAL | Status: DC
  Administered 2023-03-05 (×2): 10 mg via ORAL
  Filled 2023-03-04 (×2): qty 2

## 2023-03-04 MED ORDER — AMLODIPINE BESYLATE 5 MG PO TABS
2.5000 mg | ORAL_TABLET | Freq: Every day | ORAL | Status: DC
  Administered 2023-03-05 – 2023-03-06 (×2): 2.5 mg via ORAL
  Filled 2023-03-04 (×2): qty 1

## 2023-03-04 MED ORDER — PROCHLORPERAZINE EDISYLATE 10 MG/2ML IJ SOLN: 5.0000 mg | Freq: Four times a day (QID) | INTRAMUSCULAR | Status: DC | PRN

## 2023-03-04 MED ORDER — INSULIN ASPART 100 UNIT/ML IJ SOLN
0.0000 [IU] | Freq: Every day | INTRAMUSCULAR | Status: DC
  Administered 2023-03-05: 2 [IU] via SUBCUTANEOUS
  Filled 2023-03-04: qty 0.05

## 2023-03-04 MED ORDER — MEMANTINE HCL 10 MG PO TABS
5.0000 mg | ORAL_TABLET | Freq: Two times a day (BID) | ORAL | Status: DC
  Administered 2023-03-05 – 2023-03-06 (×4): 5 mg via ORAL
  Filled 2023-03-04 (×4): qty 1

## 2023-03-04 MED ORDER — LORAZEPAM 0.5 MG PO TABS
0.5000 mg | ORAL_TABLET | ORAL | Status: AC | PRN
  Administered 2023-03-04: 0.5 mg via ORAL
  Filled 2023-03-04: qty 1

## 2023-03-04 MED ORDER — FLORANEX PO PACK
1.0000 g | PACK | Freq: Every day | ORAL | Status: DC
  Administered 2023-03-05 – 2023-03-06 (×2): 1 g via ORAL
  Filled 2023-03-04 (×3): qty 1

## 2023-03-04 MED ORDER — BUPROPION HCL ER (XL) 300 MG PO TB24
300.0000 mg | ORAL_TABLET | Freq: Every day | ORAL | Status: DC
  Administered 2023-03-05 – 2023-03-06 (×2): 300 mg via ORAL
  Filled 2023-03-04 (×2): qty 1

## 2023-03-04 MED ORDER — ASPIRIN 81 MG PO CHEW
81.0000 mg | CHEWABLE_TABLET | Freq: Every day | ORAL | Status: DC
  Administered 2023-03-05 – 2023-03-06 (×2): 81 mg via ORAL
  Filled 2023-03-04 (×2): qty 1

## 2023-03-04 MED ORDER — POLYETHYLENE GLYCOL 3350 17 G PO PACK: 17.0000 g | PACK | Freq: Every day | ORAL | Status: DC | PRN

## 2023-03-04 MED ORDER — LACTATED RINGERS IV BOLUS
500.0000 mL | Freq: Once | INTRAVENOUS | Status: AC
  Administered 2023-03-04: 500 mL via INTRAVENOUS

## 2023-03-04 MED ORDER — ACETAMINOPHEN 325 MG PO TABS
650.0000 mg | ORAL_TABLET | Freq: Once | ORAL | Status: AC
  Administered 2023-03-04: 650 mg via ORAL
  Filled 2023-03-04: qty 2

## 2023-03-04 MED ORDER — ENOXAPARIN SODIUM 30 MG/0.3ML IJ SOSY: 30.0000 mg | PREFILLED_SYRINGE | INTRAMUSCULAR | Status: DC

## 2023-03-04 MED ORDER — LACTATED RINGERS IV SOLN: INTRAVENOUS | Status: DC

## 2023-03-04 MED ORDER — IPRATROPIUM-ALBUTEROL 0.5-2.5 (3) MG/3ML IN SOLN
3.0000 mL | Freq: Once | RESPIRATORY_TRACT | Status: AC
  Administered 2023-03-04: 3 mL via RESPIRATORY_TRACT
  Filled 2023-03-04: qty 3

## 2023-03-04 MED ORDER — FOLIC ACID 1 MG PO TABS
1.0000 mg | ORAL_TABLET | Freq: Every day | ORAL | Status: DC
  Administered 2023-03-05 – 2023-03-06 (×2): 1 mg via ORAL
  Filled 2023-03-04 (×2): qty 1

## 2023-03-04 MED ORDER — METHYLPREDNISOLONE SODIUM SUCC 40 MG IJ SOLR
40.0000 mg | Freq: Once | INTRAMUSCULAR | Status: AC
  Administered 2023-03-04: 40 mg via INTRAVENOUS
  Filled 2023-03-04: qty 1

## 2023-03-04 MED ORDER — GLYCERIN (LAXATIVE) 2 G RE SUPP
1.0000 | Freq: Once | RECTAL | Status: AC
  Administered 2023-03-04: 1 via RECTAL
  Filled 2023-03-04: qty 1

## 2023-03-04 MED ORDER — MINERAL OIL RE ENEM
1.0000 | ENEMA | Freq: Once | RECTAL | Status: AC
  Administered 2023-03-04: 1 via RECTAL
  Filled 2023-03-04: qty 1

## 2023-03-04 MED ORDER — INSULIN ASPART 100 UNIT/ML IJ SOLN
0.0000 [IU] | Freq: Three times a day (TID) | INTRAMUSCULAR | Status: DC
  Administered 2023-03-05: 2 [IU] via SUBCUTANEOUS
  Administered 2023-03-06: 1 [IU] via SUBCUTANEOUS
  Administered 2023-03-06: 2 [IU] via SUBCUTANEOUS
  Filled 2023-03-04: qty 0.09

## 2023-03-04 MED ORDER — ADULT MULTIVITAMIN W/MINERALS CH
1.0000 | ORAL_TABLET | Freq: Every day | ORAL | Status: DC
  Administered 2023-03-05 – 2023-03-06 (×2): 1 via ORAL
  Filled 2023-03-04 (×2): qty 1

## 2023-03-04 MED ORDER — FERROUS SULFATE 325 (65 FE) MG PO TABS
325.0000 mg | ORAL_TABLET | Freq: Every day | ORAL | Status: DC
  Administered 2023-03-05 – 2023-03-06 (×2): 325 mg via ORAL
  Filled 2023-03-04 (×2): qty 1

## 2023-03-04 NOTE — ED Notes (Signed)
Pt to ct via stretcher

## 2023-03-04 NOTE — ED Notes (Signed)
Pt daughter, [name], called and given update on pt

## 2023-03-04 NOTE — Care Management (Addendum)
Transition of Care [facility]) - Emergency Department Mini Assessment   Patient Details  Name: Amanda Porter MRN: [id number] Date of Birth: 01-27-1946  Transition of Care [facility]) CM/SW Contact:    Amanda Kaufman, RN Phone Number: 03/04/2023, 2:57 PM   Clinical Narrative: Received TOC consult for SNF vs residential hospice. Per chart review patient has home hospice services with [facility]. This RNCM spoke with patient and her daughter Amanda Porter, who reports patient lives with her daughter. Patient's daughter also has an autistic child that she cares for. Amanda Porter reports patient had a recent decline to include inability to ambulate and has not been eating and has not had a BM in 4 days. Patient's daughter Amanda Porter reports she applied for Medicaid on yesterday.This RNCM advised Medicaid takes approx. 30-45 days for approval. Patient's daughter reports she has spoke with "[facility]" and did not qualify for private pay or Medicaid for long term care. Patient will need PT eval if she no longer qualifies for hospice services. This RNCM advised patient's daughter Amanda Porter that patient can not be placed for long term care SNF from the emergency room, patient's daughter verbalized understanding. Patient's daughter has difficulty while receiving calls while at the hospital prefers an email: [email or web address].  This RNCM left voicemail message for Amanda Porter with "[facility]", awaiting a call back.   - 3:18pm This RNCM spoke with Amanda Porter with [facility] who reports patient just came on with home hospice services 5 days ago. Amanda Porter has been in contact with patient's daughter since being in the hospital. [facility] will await medical work up.  TOC will continue to follow for discharge needs.      ED Mini Assessment: What brought you to the Emergency Department? : confusion, slurred speech  Barriers to Discharge: SNF Pending Medicaid  Barrier interventions: no long term insurance     Interventions which  prevented an admission or readmission: SNF Placement, Palliative Care    Patient Contact and Communications        ,          Patient states their goals for this hospitalization and ongoing recovery are:: long term placement CMS Medicare.gov Compare Post Acute Care list provided to:: Patient Represenative (must comment) Amanda Porter (daughter)) Choice offered to / list presented to : Adult Children  Admission diagnosis:  failure to thrive Patient Active Problem List   Diagnosis Date Noted   SDH (subdural hematoma) 02/13/2023   Altered mental status 02/05/2023   Subdural hematoma 02/03/2023   Compression of brain [id number]   Complication of ventricular intracranial shunt 02/03/2023   Malnutrition of moderate degree 12/18/2022   COPD (chronic obstructive pulmonary disease) 12/16/2022   Diabetes mellitus without complication [id number]   GERD (gastroesophageal reflux disease) 12/16/2022   Fall at home, initial encounter 12/16/2022   Angiodysplasia of stomach and duodenum 12/16/2022   Bleeding duodenal ulcer 12/16/2022   Status post right hip replacement 10/13/2022   Shunt malfunction    Closed displaced fracture of right femoral neck 09/06/2022   Multiple lung nodules 08/03/2022   Acute urinary retention 08/01/2022   Normocytic anemia 07/29/2022   Recurrent UTI 06/24/2022   Recurrent falls 06/24/2022   DNR (do not resuscitate) 06/24/2022   S/P VP shunt 04/22/2022   Pleural effusion on left 04/08/2022   Decreased urine output 04/04/2022   Vitamin D deficiency 04/04/2022   Other fatigue 04/04/2022   Hypokalemia 04/04/2022   Urinary retention 12/13/2021   Wrist fracture, closed, left, sequela 12/13/2021  Protein-calorie malnutrition, severe 11/07/2021   Tardive dyskinesia 02/01/2020   Cervical spondylolysis 05/24/2019   Chronic respiratory failure with hypoxia 02/25/2019   Postural dizziness 08/11/2018   Decreased hemoglobin 07/02/2018   Anemia 04/12/2018   Type 2  diabetes mellitus with hyperglycemia 02/18/2018   Pain of left lower extremity 11/26/2017   Memory disturbance 11/20/2017   Right carotid bruit 11/20/2017   Hyperlipidemia 09/14/2017   COPD mixed type 09/14/2017   Dementia without behavioral disturbance 09/14/2017   Normal pressure hydrocephalus 09/14/2017   Dysphagia 09/14/2017   Primary hypertension 09/14/2017   History of CVA (cerebrovascular accident) 09/14/2017   Anxiety and depression 09/14/2017   Tobacco use disorder 12/10/2016   PCP:  Amanda Koch, NP Pharmacy:   [id number] - [place], [place] [address] [place] [address] Phone: [phone] Fax: [phone]

## 2023-03-04 NOTE — ED Notes (Signed)
Care manager at bedside.

## 2023-03-04 NOTE — ED Notes (Signed)
Pt daughter states that her phone does not always work when the hospital calls, she had issues during her mothers last admission. Daughter asked to be emailed or texted if call does not work

## 2023-03-04 NOTE — Progress Notes (Signed)
[facility] [address] [facility])       This patient is a current hospice patient with [facility], admitted with a terminal diagnosis of obstructive hydrocephalus.     [facility] will continue to follow for any discharge planning needs and to coordinate continuation of hospice care.   Please don't hesitate to call with any Hospice related questions or concerns.    Thank you for the opportunity to participate in this patient's care. Jhonnie Garner, Therapist, sports, BSN [facility] Liaison [phone]

## 2023-03-04 NOTE — ED Notes (Signed)
Pt is incontinent, will need to straight cath for urine per daughter

## 2023-03-04 NOTE — ED Notes (Signed)
No results with enema

## 2023-03-04 NOTE — ED Provider Notes (Signed)
77 yo female w/ hx of NPH s/p drainage, discharged on home hospice, daughter feels she cannot manage patient well at home  Physical Exam  BP 136/74   Pulse 81   Temp 97.9 F (36.6 C) (Axillary)   Resp 19   Ht 5\' 2"  (1.575 m)   Wt 41.7 kg   SpO2 98%   BMI 16.83 kg/m   Physical Exam  Procedures  Procedures  ED Course / MDM    Medical Decision Making Amount and/or Complexity of Data Reviewed Labs: ordered. Radiology: ordered.  Risk OTC drugs. Prescription drug management.   There were no emergent findings on the patient's MRI or workup in the ED.  I discussed again the case with her daughter, who confirms the patient has had a rapid decline in cognitive status over the past week, worsening confusion.  At this point we will admit the patient back to the hospital for altered mental status.  It is quite possible these are symptoms of progressing normal pressure hydrocephalus, for which they no longer want aggressive treatments, and has been felt to be poor candidate for any further treatment by the neurosurgeons according to her daughter.  The patient's daughter confirms that the patient is DNR.  Her CODE STATUS has been updated here.       Wyvonnia Dusky, MD 03/04/23 [phone]

## 2023-03-04 NOTE — ED Notes (Signed)
Pt to MRI

## 2023-03-04 NOTE — H&P (Incomplete)
History and Physical  Amanda Porter [id number] DOB: [birth year] DOA: 03/04/2023  Referring physician: Dr. Langston Porter, Amanda  PCP: Pleas Koch, NP  Outpatient Specialists: Palliative care medicine Patient coming from: Home  Chief Complaint: AMS   HPI: Amanda Porter is a 77 y.o. female with medical history significant for dementia, hydrocephalus, recent admission for subdural hematoma with mass effect and rightward shift requiring emergent drain placement and VP shunt removal due to shunt malfunction, was discharged with hospice care, COPD, lung cancer status post upper left lobectomy, former tobacco user, chronic anxiety/depression, hypertension, hyperlipidemia, type 2 diabetes, who presented to [facility] ED due to worsening altered mental status.  After discharge from the hospital the patient had been doing well up until a week ago.  She did became progressively more confused.  History is obtained from the daughter, per EDP.  At the time of this visit no family members in the room.  The patient is alert and confused.  Frail-appearing, she has no complaints.  A noncontrast MRI brain was nonacute.  UA was negative for pyuria.  In the ED patient received IV Solu-Medrol 40 mg x 1, p.o. Ativan 0.5 mg for MRI sedation, 1 enema, 1 dose of DuoNebs x 1 and IV fluid bolus LR at 500 cc x 1.  TRH, hospitalist service, was asked to admit.  ED Course: Tmax 98.1.  BP 130/78, pulse 81, respiratory rate 18, saturation 100% on room air.  Lab studies remarkable for serum glucose 132, BUN 27, albumin 3.4.  Hemoglobin 9.3.  Review of Systems: Review of systems as noted in the HPI. All other systems reviewed and are negative.   Past Medical History:  Diagnosis Date   Acute respiratory failure requiring reintubation 11/05/2021   Anemia    Anxiety    Anxiety and depression    COPD (chronic obstructive pulmonary disease)    CVA (cerebral vascular accident)    CVA (cerebral vascular accident)    L sided deficits    Dementia    Depression    Diabetes mellitus    Type 2   Diabetes mellitus without complication    Dysphagia    Dysphasia    Falls    GERD (gastroesophageal reflux disease)    HCAP (healthcare-associated pneumonia) 12/16/2022   History of lung cancer    Hyperlipidemia    Hypertension    Influenza A 12/13/2021   Memory disturbance 11/20/2017   Normal pressure hydrocephalus 2018   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   Past Surgical History:  Procedure Laterality Date   BURR HOLE Right 02/03/2023   Procedure: BURR HOLE X 1, ventriculoperitoneal shunt removal;  Surgeon: Meade Maw, MD;  Location: [facility] ORS;  Service: Neurosurgery;  Laterality: Right;   ESOPHAGOGASTRODUODENOSCOPY (EGD) WITH PROPOFOL N/A 12/16/2022   Procedure: ESOPHAGOGASTRODUODENOSCOPY (EGD) WITH PROPOFOL;  Surgeon: Lucilla Lame, MD;  Location: [facility] ENDOSCOPY;  Service: Endoscopy;  Laterality: N/A;   HIP ARTHROPLASTY Right 09/07/2022   Procedure: ARTHROPLASTY BIPOLAR HIP (HEMIARTHROPLASTY);  Surgeon: Lovell Sheehan, MD;  Location: [facility] ORS;  Service: Orthopedics;  Laterality: Right;   IR FL GUIDED LOC OF NEEDLE/CATH TIP FOR SPINAL INJECTION RT  07/25/2022   LUNG REMOVAL, PARTIAL     left upper lobe  SHUNT REVISION VENTRICULAR-PERITONEAL N/A 09/19/2022   Procedure: VENTRICULOPERITONEAL SHUNT REVISION;  Surgeon: Meade Maw, MD;  Location: [facility] ORS;  Service: Neurosurgery;  Laterality: N/A;   VENTRICULOPERITONEAL SHUNT  12/09/2016   VENTRICULOPERITONEAL SHUNT  10/2018    Social History:  reports that she quit smoking about 5 years ago. Her smoking use included cigarettes. She has a 25.00 pack-year smoking history. She has been exposed to tobacco smoke. She has never used smokeless  tobacco. She reports that she does not currently use alcohol. She reports that she does not currently use drugs.   Allergies  Allergen Reactions   Neosporin [Bacitracin-Polymyxin B] Rash    Really bad rash   Lexapro [Escitalopram Oxalate] Other (See Comments)    Dizziness, nausea, fatigue   Neosporin  [Neomycin-Polymyxin-Gramicidin] Rash    (Neosporin)    Family History  Problem Relation Age of Onset   Pneumonia Mother    Cancer Father    Hyperlipidemia Sister    Hypertension Sister    Heart disease Sister    Diabetes Sister    Hypertension Sister    Diabetes Sister    Pancreatic cancer Sister    Throat cancer Brother    Cancer Brother    Cancer Brother    Breast cancer Neg Hx       Prior to Admission medications   Medication Sig Start Date End Date Taking? Authorizing Provider  acetaminophen (TYLENOL) 325 MG tablet Take 2 tablets (650 mg total) by mouth every 6 (six) hours as needed for mild pain. 02/22/23   Angiulli, Lavon Paganini, PA-C  albuterol (VENTOLIN HFA) 108 (90 Base) MCG/ACT inhaler INHALE 2 PUFFS BY MOUTH EVERY 4 HOURS AS NEEDED 02/22/23   Angiulli, Lavon Paganini, PA-C  Albuterol Sulfate 2.5 MG/0.5ML NEBU Inhale 2.5 mg into the lungs every 6 (six) hours as needed (shortness of breath).    [provider]  amLODipine (NORVASC) 2.5 MG tablet Take 1 tablet (2.5 mg total) by mouth daily. for blood pressure. 02/22/23   Angiulli, Lavon Paganini, PA-C  ascorbic acid (VITAMIN C) 500 MG tablet Take 1 tablet (500 mg total) by mouth daily. 12/19/22 03/19/23  Val Riles, MD  aspirin 81 MG chewable tablet Chew 1 tablet (81 mg total) by mouth daily. Hold for now and resume on 12/22/22 12/22/22   Val Riles, MD  Baclofen 5 MG TABS Take 1 tablet (5 mg total) by mouth daily as needed for muscle spasms. 02/23/23   Angiulli, Lavon Paganini, PA-C  buPROPion (WELLBUTRIN XL) 300 MG 24 hr tablet Take 1 tablet (300 mg total) by mouth daily with breakfast. for anxiety and depression. 02/22/23   Angiulli,  Lavon Paganini, PA-C  busPIRone (BUSPAR) 30 MG tablet Take 1 tablet (30 mg total) by mouth 2 (two) times daily. 02/23/23   Angiulli, Lavon Paganini, PA-C  butalbital-acetaminophen-caffeine (FIORICET) [phone] MG tablet Take 1 tablet by mouth every 8 (eight) hours as needed for headache. 02/23/23   Angiulli, Lavon Paganini, PA-C  Calcium Carb-Cholecalciferol (CALCIUM/VITAMIN D PO) Take 1 tablet by mouth daily.    [provider]  cephALEXin (KEFLEX) 500 MG capsule Take 1 capsule (500 mg total) by mouth every 12 (twelve) hours. 02/23/23   Angiulli, Lavon Paganini, PA-C  conjugated estrogens (PREMARIN) vaginal cream Apply one pea-sized amount around the opening of the urethra daily for 2 weeks, then 3 times weekly moving forward. 08/20/22   Vaillancourt, Aldona Bar, PA-C  ferrous sulfate 325 (65 FE) MG tablet Take 1 tablet (325 mg total) by mouth daily  with breakfast. 02/22/23 05/23/23  Angiulli, Lavon Paganini, PA-C  folic acid (FOLVITE) 1 MG tablet Take 1 tablet (1 mg total) by mouth daily. 02/22/23   Angiulli, Lavon Paganini, PA-C  glipiZIDE (GLUCOTROL XL) 2.5 MG 24 hr tablet Take 1 tablet (2.5 mg total) by mouth daily with breakfast. For diabetes. 02/22/23   Angiulli, Lavon Paganini, PA-C  lactobacillus (FLORANEX/LACTINEX) PACK Take 1 packet (1 g total) by mouth daily. 02/23/23   Angiulli, Lavon Paganini, PA-C  Melatonin 10 MG TABS Take 10 mg by mouth at bedtime. 02/23/23   Angiulli, Lavon Paganini, PA-C  memantine (NAMENDA) 5 MG tablet Take 1 tablet (5 mg total) by mouth 2 (two) times daily. For memory. 02/23/23   Angiulli, Lavon Paganini, PA-C  Multiple Vitamin (MULTIVITAMIN WITH MINERALS) TABS tablet Take 1 tablet by mouth daily. 02/23/23   Angiulli, Lavon Paganini, PA-C  pantoprazole (PROTONIX) 40 MG tablet Take 1 tablet (40 mg total) by mouth daily. 02/23/23 05/24/23  Angiulli, Lavon Paganini, PA-C  PROLIA 60 MG/ML SOSY injection Inject 60 mg into the skin every 6 (six) months. 01/27/23   [provider]  rosuvastatin (CRESTOR) 20 MG tablet Take 1 tablet (20 mg  total) by mouth every evening. For cholesterol. 02/23/23   Angiulli, Lavon Paganini, PA-C  Tiotropium Bromide-Olodaterol (STIOLTO RESPIMAT) 2.5-2.5 MCG/ACT AERS Inhale 2 each into the lungs in the morning.    [provider]    Physical Exam: BP 138/70   Pulse 81   Temp 97.6 F (36.4 C) (Oral)   Resp 18   Ht 5\' 2"  (1.575 m)   Wt 41.7 kg   SpO2 100%   BMI 16.83 kg/m   General: 77 y.o. year-old female well developed well nourished in no acute distress.  Alert and oriented x3. Cardiovascular: Regular rate and rhythm with no rubs or gallops.  No thyromegaly or JVD noted.  No lower extremity edema. 2/4 pulses in all 4 extremities. Respiratory: Clear to auscultation with no wheezes or rales. Good inspiratory effort. Abdomen: Soft nontender nondistended with normal bowel sounds x4 quadrants. Muskuloskeletal: No cyanosis, clubbing or edema noted bilaterally Neuro: CN II-XII intact, strength, sensation, reflexes Skin: No ulcerative lesions noted or rashes Psychiatry: Judgement and insight appear normal. Mood is appropriate for condition and setting          Labs on Admission:  Basic Metabolic Panel: Recent Labs  Lab 03/04/23 1224  NA 143  K 3.8  CL 106  CO2 29  GLUCOSE 132*  BUN 27*  CREATININE 0.63  CALCIUM 9.3   Liver Function Tests: Recent Labs  Lab 03/04/23 1224  AST 21  ALT 15  ALKPHOS 49  BILITOT 0.2*  PROT 7.5  ALBUMIN 3.4*   No results for input(s): "LIPASE", "AMYLASE" in the last 168 hours. Recent Labs  Lab 03/04/23 1256  AMMONIA 32   CBC: Recent Labs  Lab 03/04/23 1224  WBC 8.4  NEUTROABS 4.7  HGB 9.3*  HCT 31.7*  MCV 91.1  PLT 314   Cardiac Enzymes: No results for input(s): "CKTOTAL", "CKMB", "CKMBINDEX", "TROPONINI" in the last 168 hours.  BNP (last 3 results) Recent Labs    09/06/22 1020  BNP 49.1    ProBNP (last 3 results) Recent Labs    04/04/22 1025  PROBNP 13.0    CBG: Recent Labs  Lab 03/04/23 1240  GLUCAP 113*     Radiological Exams on Admission: DG Abdomen 1 View  Result Date: 03/04/2023 CLINICAL DATA:  Constipation EXAM: ABDOMEN - 1 VIEW  COMPARISON:  X-ray 02/03/2023 FINDINGS: Large amount of diffuse colonic stool identified. Significant gas and stool in the rectum. There is gas seen in nondilated loops of small and large bowel. No obstruction. No obvious free air on this supine radiograph. The diaphragm is clipped off the edge of the film. Right hip arthroplasty seen at the edge of the imaging field. Previous VP shunt catheter tubing is less appreciated on the current examination. IMPRESSION: Diffuse large amount of colonic stool.  Gas and stool in the rectum. Electronically Signed   By: Jill Side M.D.   On: 03/04/2023 16:00   CT HEAD WO CONTRAST  Result Date: 03/04/2023 CLINICAL DATA:  Altered mental status.  Failure to thrive. EXAM: CT HEAD WITHOUT CONTRAST TECHNIQUE: Contiguous axial images were obtained from the base of the skull through the vertex without intravenous contrast. RADIATION DOSE REDUCTION: This exam was performed according to the departmental dose-optimization program which includes automated exposure control, adjustment of the mA and/or kV according to patient size and/or use of iterative reconstruction technique. COMPARISON:  CT head 02/17/2023 FINDINGS: Brain: There is no acute intracranial hemorrhage, extra-axial fluid collection, or acute infarct. The previously seen right cerebral convexity subdural collection has essentially resolved with possible minimal residual overlying the anteroinferior frontal lobe (4-21). Background parenchymal volume loss with prominence of the ventricular system and extra-axial CSF spaces is unchanged. The ventricles are stable in size. Extensive hypodensity in the supratentorial white matter consistent with underlying chronic small-vessel ischemic change is stable. A remote infarct in the right thalamus is unchanged. A small focus of encephalomalacia in the  right frontal lobe underlying a burr hole is unchanged. The pituitary and suprasellar region are normal. There is no mass lesion. There is no mass effect or midline shift. Vascular: No hyperdense vessel or unexpected calcification. Skull: Right frontal burr holes are again noted. There is no acute or suspicious osseous abnormality. Sinuses/Orbits: The imaged paranasal sinuses are clear. The globes and orbits are unremarkable. Other: None. IMPRESSION: 1. The previously identified right cerebral convexity subdural collection has essentially resolved. 2. No new acute intracranial pathology. Electronically Signed   By: Valetta Mole M.D.   On: 03/04/2023 15:52   DG Chest Port 1 View  Result Date: 03/04/2023 CLINICAL DATA:  Altered mental status EXAM: PORTABLE CHEST 1 VIEW COMPARISON:  02/03/2023 FINDINGS: Stable cardiomediastinal contours. Aortic atherosclerosis. Surgical clips in the left hilar region. No focal airspace consolidation, pleural effusion, or pneumothorax. Previously seen shunt catheter is no longer evident. IMPRESSION: No active disease. Electronically Signed   By: Davina Poke D.O.   On: 03/04/2023 13:31    EKG: I independently viewed the EKG done and my findings are as followed: Sinus rhythm rate of 84.  Nonspecific ST-T changes.  QTc 443.  Assessment/Plan Present on Admission:  AMS (altered mental status)  Principal Problem:   AMS (altered mental status)  Acute metabolic encephalopathy in the setting of dementia, recent subdural with rightward shift requiring emergent drain placement and VP shunt removal due to shunt malfunction MRI brain with no acute findings UA negative for pyuria Suspect progression from dementia Reorient as needed Gentle IV fluid hydration LR 50 cc/h x 1 day Fall, aspiration, delirium precautions. Palliative care team consulted to assist with goals of care Patient discharged with hospice with recent admission DNR confirmed by daughter on the phone per  EDP  Failure to thrive in adult BMI 16 Albumin 3.4 Severe muscle mass loss Encourage oral protein calorie intake as tolerated Monitor electrolytes  PT OT assessment Fall precautions  Type 2 diabetes with hyperglycemia Hold off home oral hypoglycemics Last hemoglobin A1c 6.8 on 01/22/2023 Start insulin sliding scale.  Hypertension BP stable Resume home amlodipine Monitor vital signs  GERD Resume home PPI  Hyperlipidemia Resume home Crestor  Iron deficiency anemia Resume home iron supplement No overt bleeding reported      DVT prophylaxis: Subcu Lovenox daily  Code Status: Full code  Family Communication: None at bedside  Disposition Plan: Admitted to telemetry unit  Consults called: Palliative care team  Admission status: Inpatient status.   Status is: Inpatient The patient requires at least 2 midnights for further evaluation and treatment of present condition.   Kayleen Memos MD [facility] Pager [phone]  If 7PM-7AM, please contact night-coverage [email or web address] Password [facility]  03/04/2023, 11:25 PM

## 2023-03-04 NOTE — ED Provider Notes (Addendum)
[facility] AT [facility] Provider Note   CSN: [id number] Arrival date & time: 03/04/23  1146     History  Chief Complaint  Patient presents with   Failure To Thrive   Fatigue    Isabelle A Ferrebee is a 77 y.o. female.  HPI   77 y.o. right-handed female with history of COPD as well as lung cancer status post upper lobe lobectomy, former smoker chronic anemia anxiety with depression, dementia maintained on Namenda, CVA with left-sided deficits, diabetes mellitus hyperlipidemia, hypertension, tardive dyskinesia, recurrent UTIs, right THA 09/07/2022 per Dr. Harlow Mares, right VP shunt replacement for NPH about 5 months ago per Dr. Elayne Guerin, now status post shunt removal, on home hospice currently, presenting with change in mental status and persistent decline.  The patient has been having episodes of urinary incontinence and the patient's daughter wanted her to be evaluated for possible sepsis and UTI.  Additionally she has had increasing difficulties with gait and ambulation.  She is barely able to get out of bed today which is a an acute change since this Friday.  She has had increasing confusion, slurred speech.  She has been complaining of headaches.  She has been unsteady with her gait.  Per her daughter this has been ongoing since Friday.  At baseline, the patient is alert and oriented to self and place, now is disoriented to everything but self.  Has been previously treated for UTI with Keflex.  Her shunt was removed on 3/5 and she is no longer deemed a surgical candidate.  Daughter states that she is having difficulty with caring for her in the home at this point and is inquiring regarding residential hospice placement versus SNF placement.   Home Medications Prior to Admission medications   Medication Sig Start Date End Date Taking? Authorizing Provider  acetaminophen (TYLENOL) 325 MG tablet Take 2 tablets (650 mg total) by mouth every 6 (six) hours as  needed for mild pain. 02/22/23   Angiulli, Lavon Paganini, PA-C  albuterol (VENTOLIN HFA) 108 (90 Base) MCG/ACT inhaler INHALE 2 PUFFS BY MOUTH EVERY 4 HOURS AS NEEDED 02/22/23   Angiulli, Lavon Paganini, PA-C  Albuterol Sulfate 2.5 MG/0.5ML NEBU Inhale 2.5 mg into the lungs every 6 (six) hours as needed (shortness of breath).    [provider]  amLODipine (NORVASC) 2.5 MG tablet Take 1 tablet (2.5 mg total) by mouth daily. for blood pressure. 02/22/23   Angiulli, Lavon Paganini, PA-C  ascorbic acid (VITAMIN C) 500 MG tablet Take 1 tablet (500 mg total) by mouth daily. 12/19/22 03/19/23  Val Riles, MD  aspirin 81 MG chewable tablet Chew 1 tablet (81 mg total) by mouth daily. Hold for now and resume on 12/22/22 12/22/22   Val Riles, MD  Baclofen 5 MG TABS Take 1 tablet (5 mg total) by mouth daily as needed for muscle spasms. 02/23/23   Angiulli, Lavon Paganini, PA-C  buPROPion (WELLBUTRIN XL) 300 MG 24 hr tablet Take 1 tablet (300 mg total) by mouth daily with breakfast. for anxiety and depression. 02/22/23   Angiulli, Lavon Paganini, PA-C  busPIRone (BUSPAR) 30 MG tablet Take 1 tablet (30 mg total) by mouth 2 (two) times daily. 02/23/23   Angiulli, Lavon Paganini, PA-C  butalbital-acetaminophen-caffeine (FIORICET) [phone] MG tablet Take 1 tablet by mouth every 8 (eight) hours as needed for headache. 02/23/23   Angiulli, Lavon Paganini, PA-C  Calcium Carb-Cholecalciferol (CALCIUM/VITAMIN D PO) Take 1 tablet by mouth daily.    [provider]  cephALEXin (KEFLEX) 500 MG capsule Take 1 capsule (500 mg total) by mouth every 12 (twelve) hours. 02/23/23   Angiulli, Lavon Paganini, PA-C  conjugated estrogens (PREMARIN) vaginal cream Apply one pea-sized amount around the opening of the urethra daily for 2 weeks, then 3 times weekly moving forward. 08/20/22   Debroah Loop, PA-C  ferrous sulfate 325 (65 FE) MG tablet Take 1 tablet (325 mg total) by mouth daily with breakfast. 02/22/23 05/23/23  Angiulli, Lavon Paganini, PA-C  folic acid  (FOLVITE) 1 MG tablet Take 1 tablet (1 mg total) by mouth daily. 02/22/23   Angiulli, Lavon Paganini, PA-C  glipiZIDE (GLUCOTROL XL) 2.5 MG 24 hr tablet Take 1 tablet (2.5 mg total) by mouth daily with breakfast. For diabetes. 02/22/23   Angiulli, Lavon Paganini, PA-C  lactobacillus (FLORANEX/LACTINEX) PACK Take 1 packet (1 g total) by mouth daily. 02/23/23   Angiulli, Lavon Paganini, PA-C  Melatonin 10 MG TABS Take 10 mg by mouth at bedtime. 02/23/23   Angiulli, Lavon Paganini, PA-C  memantine (NAMENDA) 5 MG tablet Take 1 tablet (5 mg total) by mouth 2 (two) times daily. For memory. 02/23/23   Angiulli, Lavon Paganini, PA-C  Multiple Vitamin (MULTIVITAMIN WITH MINERALS) TABS tablet Take 1 tablet by mouth daily. 02/23/23   Angiulli, Lavon Paganini, PA-C  pantoprazole (PROTONIX) 40 MG tablet Take 1 tablet (40 mg total) by mouth daily. 02/23/23 05/24/23  Angiulli, Lavon Paganini, PA-C  PROLIA 60 MG/ML SOSY injection Inject 60 mg into the skin every 6 (six) months. 01/27/23   [provider]  rosuvastatin (CRESTOR) 20 MG tablet Take 1 tablet (20 mg total) by mouth every evening. For cholesterol. 02/23/23   Angiulli, Lavon Paganini, PA-C  Tiotropium Bromide-Olodaterol (STIOLTO RESPIMAT) 2.5-2.5 MCG/ACT AERS Inhale 2 each into the lungs in the morning.    [provider]      Allergies    Neosporin [bacitracin-polymyxin b], Lexapro [escitalopram oxalate], and Neosporin  [neomycin-polymyxin-gramicidin]    Review of Systems   Review of Systems  Unable to perform ROS: Mental status change    Physical Exam Updated Vital Signs BP (!) 149/67   Pulse 80   Temp 97.9 F (36.6 C) (Axillary)   Resp 17   Ht 5\' 2"  (1.575 m)   Wt 41.7 kg   SpO2 99%   BMI 16.83 kg/m  Physical Exam Vitals and nursing note reviewed.  Constitutional:      General: She is not in acute distress.    Appearance: She is well-developed.     Comments: GCS 14, AAOx1  HENT:     Head: Normocephalic and atraumatic.     Mouth/Throat:     Mouth: Mucous membranes are  dry.  Eyes:     Conjunctiva/sclera: Conjunctivae normal.  Cardiovascular:     Rate and Rhythm: Normal rate and regular rhythm.  Pulmonary:     Effort: Pulmonary effort is normal. No respiratory distress.     Breath sounds: Wheezing present.     Comments: Mild expiratory wheezes present all lung fields, no distress Abdominal:     Palpations: Abdomen is soft.     Tenderness: There is no abdominal tenderness.  Musculoskeletal:        General: No swelling.     Cervical back: Neck supple.  Skin:    General: Skin is warm and dry.     Capillary Refill: Capillary refill takes less than 2 seconds.  Neurological:     Mental Status: She is alert.  Comments: MENTAL STATUS EXAM:    Orientation: Alert and oriented to person, DISORIENTED TO PLACE AND TIME Memory: Difficult to assess Language: Speech is slurred and garbled  CRANIAL NERVES:    CN 2 (Optic): Visual fields intact to confrontation.  CN 3,4,6 (EOM): Pupils equal and reactive to light. Full extraocular eye movement without nystagmus.  CN 5 (Trigeminal): Facial sensation is normal, no weakness of masticatory muscles.  CN 7 (Facial): Slight LFD CN 8 (Auditory): Auditory acuity grossly normal.  CN 9,10 (Glossophar): The uvula is midline, the palate elevates symmetrically.  CN 11 (spinal access): Normal sternocleidomastoid and trapezius strength.  CN 12 (Hypoglossal): The tongue is midline. No atrophy or fasciculations.Marland Kitchen   MOTOR:  Muscle Strength: 5/5RUE, 4/5LUE, 5/5RLE, 4/5LLE.   COORDINATION:   Unable to assess.   SENSATION:   Unable to assess.  GAIT: Unable to assess      ED Results / Procedures / Treatments   Labs (all labs ordered are listed, but only abnormal results are displayed) Labs Reviewed  COMPREHENSIVE METABOLIC PANEL - Abnormal; Notable for the following components:      Result Value   Glucose, Bld 132 (*)    BUN 27 (*)    Albumin 3.4 (*)    Total Bilirubin 0.2 (*)    All other components within normal  limits  CBC WITH DIFFERENTIAL/PLATELET - Abnormal; Notable for the following components:   RBC 3.48 (*)    Hemoglobin 9.3 (*)    HCT 31.7 (*)    MCHC 29.3 (*)    RDW 15.7 (*)    All other components within normal limits  URINALYSIS, W/ REFLEX TO CULTURE (INFECTION SUSPECTED) - Abnormal; Notable for the following components:   Bacteria, UA RARE (*)    All other components within normal limits  CBG MONITORING, ED - Abnormal; Notable for the following components:   Glucose-Capillary 113 (*)    All other components within normal limits  AMMONIA    EKG EKG Interpretation  Date/Time:  Wednesday March 04 2023 12:36:23 EDT Ventricular Rate:  84 PR Interval:  137 QRS Duration: 106 QT Interval:  374 QTC Calculation: 443 R Axis:   58 Text Interpretation: Sinus rhythm Abnormal R-wave progression, early transition Minimal ST elevation, anterior leads No significant change since last tracing Confirmed by Regan Lemming ([id number]) on 03/04/2023 2:23:17 PM  Radiology DG Abdomen 1 View  Result Date: 03/04/2023 CLINICAL DATA:  Constipation EXAM: ABDOMEN - 1 VIEW COMPARISON:  X-ray 02/03/2023 FINDINGS: Large amount of diffuse colonic stool identified. Significant gas and stool in the rectum. There is gas seen in nondilated loops of small and large bowel. No obstruction. No obvious free air on this supine radiograph. The diaphragm is clipped off the edge of the film. Right hip arthroplasty seen at the edge of the imaging field. Previous VP shunt catheter tubing is less appreciated on the current examination. IMPRESSION: Diffuse large amount of colonic stool.  Gas and stool in the rectum. Electronically Signed   By: Jill Side M.D.   On: 03/04/2023 16:00   CT HEAD WO CONTRAST  Result Date: 03/04/2023 CLINICAL DATA:  Altered mental status.  Failure to thrive. EXAM: CT HEAD WITHOUT CONTRAST TECHNIQUE: Contiguous axial images were obtained from the base of the skull through the vertex without intravenous  contrast. RADIATION DOSE REDUCTION: This exam was performed according to the departmental dose-optimization program which includes automated exposure control, adjustment of the mA and/or kV according to patient size and/or use of iterative reconstruction  technique. COMPARISON:  CT head 02/17/2023 FINDINGS: Brain: There is no acute intracranial hemorrhage, extra-axial fluid collection, or acute infarct. The previously seen right cerebral convexity subdural collection has essentially resolved with possible minimal residual overlying the anteroinferior frontal lobe (4-21). Background parenchymal volume loss with prominence of the ventricular system and extra-axial CSF spaces is unchanged. The ventricles are stable in size. Extensive hypodensity in the supratentorial white matter consistent with underlying chronic small-vessel ischemic change is stable. A remote infarct in the right thalamus is unchanged. A small focus of encephalomalacia in the right frontal lobe underlying a burr hole is unchanged. The pituitary and suprasellar region are normal. There is no mass lesion. There is no mass effect or midline shift. Vascular: No hyperdense vessel or unexpected calcification. Skull: Right frontal burr holes are again noted. There is no acute or suspicious osseous abnormality. Sinuses/Orbits: The imaged paranasal sinuses are clear. The globes and orbits are unremarkable. Other: None. IMPRESSION: 1. The previously identified right cerebral convexity subdural collection has essentially resolved. 2. No new acute intracranial pathology. Electronically Signed   By: Valetta Mole M.D.   On: 03/04/2023 15:52   DG Chest Port 1 View  Result Date: 03/04/2023 CLINICAL DATA:  Altered mental status EXAM: PORTABLE CHEST 1 VIEW COMPARISON:  02/03/2023 FINDINGS: Stable cardiomediastinal contours. Aortic atherosclerosis. Surgical clips in the left hilar region. No focal airspace consolidation, pleural effusion, or pneumothorax. Previously  seen shunt catheter is no longer evident. IMPRESSION: No active disease. Electronically Signed   By: Davina Poke D.O.   On: 03/04/2023 13:31    Procedures Procedures    Medications Ordered in ED Medications  Glycerin (Adult) 2 g suppository 1 suppository (has no administration in time range)  mineral oil enema 1 enema (has no administration in time range)  acetaminophen (TYLENOL) tablet 650 mg (650 mg Oral Given 03/04/23 1401)  ipratropium-albuterol (DUONEB) 0.5-2.5 (3) MG/3ML nebulizer solution 3 mL (3 mLs Nebulization Given 03/04/23 1434)  methylPREDNISolone sodium succinate (SOLU-MEDROL) 40 mg/mL injection 40 mg (40 mg Intravenous Given 03/04/23 1433)  lactated ringers bolus 500 mL (0 mLs Intravenous Stopped 03/04/23 1600)  LORazepam (ATIVAN) tablet 0.5 mg (0.5 mg Oral Given 03/04/23 1713)    ED Course/ Medical Decision Making/ A&P                             Medical Decision Making Amount and/or Complexity of Data Reviewed Labs: ordered. Radiology: ordered.  Risk OTC drugs. Prescription drug management.    77 y.o. right-handed female with history of COPD as well as lung cancer status post upper lobe lobectomy, former smoker chronic anemia anxiety with depression, dementia maintained on Namenda, CVA with left-sided deficits, diabetes mellitus hyperlipidemia, hypertension, tardive dyskinesia, recurrent UTIs, right THA 09/07/2022 per Dr. Harlow Mares, right VP shunt replacement for NPH about 5 months ago per Dr. Elayne Guerin, now status post shunt removal, on home hospice currently, presenting with change in mental status and persistent decline.  The patient has been having episodes of urinary incontinence and the patient's daughter wanted her to be evaluated for possible sepsis and UTI.  Additionally she has had increasing difficulties with gait and ambulation.  She is barely able to get out of bed today which is a an acute change since this Friday.  She has had increasing confusion,  slurred speech.  She has been complaining of headaches.  She has been unsteady with her gait.  Per her daughter this has been  ongoing since Friday.  At baseline, the patient is alert and oriented to self and place, now is disoriented to everything but self.  Has been previously treated for UTI with Keflex.  Her shunt was removed on 3/5 and she is no longer deemed a surgical candidate.  Daughter states that she is having difficulty with caring for her in the home at this point and is inquiring regarding residential hospice placement versus SNF placement.   On arrival, the patient was afebrile, temperature 98.1, not tachycardic or tachypneic, BP 153/72, saturating 96% on baseline 2 L O2 via nasal cannula.  Physical exam significant for mild expiratory wheezing present in all lung fields, no distress, GCS 14, AAO x 1 which is an acute change, neurologic exam with a left facial droop that was subtle, left hemibody weakness.  Patient symptoms have been ongoing since this past Friday but have acutely worsened over the past few days.  She is now no longer ambulatory and is incontinent of urine.  Primary differential diagnosis includes return of the patient's NPH as she is now experiencing confusion, urinary incontinence and gait instability.  Additionally considered subacute CVA.  Sigurd infectious etiology such as UTI, other electrolyte abnormality or toxic metabolic derangement.  Laboratory evaluation undertaken which revealed a CBG of 113, ammonia normal at 32, CMP without significant electrolyte abnormality, normal renal and liver function, urinalysis without evidence of UTI, CBC without a leukocytosis, stable anemia to 9.3, improved from prior measurements.  I have low suspicion for meningitis.  Chest x-ray was unremarkable for disease.  CT imaging of the head revealed the following:  IMPRESSION:  1. The previously identified right cerebral convexity subdural  collection has essentially resolved.  2. No new  acute intracranial pathology.   Patient has not had a bowel movement in an unclear amount of time.  X-ray imaging of the abdomen revealed large stool burden. IMPRESSION:  Diffuse large amount of colonic stool.  Gas and stool in the rectum.   Will attempt an enema.   MRI of the brain was ordered to further evaluate for acute CVA.  The patient was administered a 500 cc bolus as she appeared dry on exam.  She had decreased oral intake of both liquids and solids presenting with a failure to thrive type picture but also presenting with symptoms concerning for return of normal pressures hydrocephalus.  I administered Solu-Medrol IV in addition to a single DuoNeb for treatment of the patient's wheezing in the setting of her known COPD.  No evidence for pneumonia, no evidence for UTI.  TOC consult placed to assist the patient's daughter with consideration for nursing facility placement versus inpatient hospice.  Palliative care consult was also placed.  Signout given to Dr. Langston Masker pending MRI imaging, plan for likely admission vs TOC/placement if no indication for admission is found. Signout given at 1530.  Final Clinical Impression(s) / ED Diagnoses Final diagnoses:  Dehydration  Failure to thrive in adult  Gait instability  Altered mental status, unspecified altered mental status type  NPH (normal pressure hydrocephalus)    Rx / DC Orders ED Discharge Orders     None         Regan Lemming, MD 03/04/23 Drema Halon    Regan Lemming, MD 03/04/23 1906

## 2023-03-04 NOTE — ED Triage Notes (Signed)
BIB EMS from home, pt is a hospice pt for failure to thrive. Pt was previously ambulating without assistance on Friday, as of today she is barely able to get out of bed. Pt has increasing confusion, slurred speech. LKN is 1100. At baseline, pt only has confusion with the year, now she is altered to everything. Has current UTI, treated with 2 rounds of keflex.  Hx of hydrocephalus and shunt was just removed 3/5.

## 2023-03-04 NOTE — ED Notes (Signed)
Pt c/o headache. Dr. Armandina Gemma notified

## 2023-03-05 DIAGNOSIS — Z515 Encounter for palliative care: Secondary | ICD-10-CM | POA: Diagnosis not present

## 2023-03-05 DIAGNOSIS — Z7189 Other specified counseling: Secondary | ICD-10-CM | POA: Diagnosis not present

## 2023-03-05 DIAGNOSIS — R627 Adult failure to thrive: Secondary | ICD-10-CM

## 2023-03-05 DIAGNOSIS — K5900 Constipation, unspecified: Secondary | ICD-10-CM

## 2023-03-05 DIAGNOSIS — R531 Weakness: Secondary | ICD-10-CM

## 2023-03-05 DIAGNOSIS — F0390 Unspecified dementia without behavioral disturbance: Secondary | ICD-10-CM

## 2023-03-05 LAB — GLUCOSE, CAPILLARY
Glucose-Capillary: 146 mg/dL — ABNORMAL HIGH (ref 70–99)
Glucose-Capillary: 100 mg/dL — ABNORMAL HIGH (ref 70–99)
Glucose-Capillary: 178 mg/dL — ABNORMAL HIGH (ref 70–99)
Glucose-Capillary: 100 mg/dL — ABNORMAL HIGH (ref 70–99)
Glucose-Capillary: 156 mg/dL — ABNORMAL HIGH (ref 70–99)

## 2023-03-05 LAB — COMPREHENSIVE METABOLIC PANEL
ALT: 15 U/L (ref 0–44)
AST: 17 U/L (ref 15–41)
Albumin: 3.2 g/dL — ABNORMAL LOW (ref 3.5–5.0)
Alkaline Phosphatase: 44 U/L (ref 38–126)
Anion gap: 8 (ref 5–15)
BUN: 24 mg/dL — ABNORMAL HIGH (ref 8–23)
CO2: 29 mmol/L (ref 22–32)
Calcium: 9.2 mg/dL (ref 8.9–10.3)
Chloride: 103 mmol/L (ref 98–111)
Creatinine, Ser: 0.49 mg/dL (ref 0.44–1.00)
GFR, Estimated: >60 mL/min (ref >60)
Glucose, Bld: 139 mg/dL — ABNORMAL HIGH (ref 70–99)
Potassium: 3.5 mmol/L (ref 3.5–5.1)
Sodium: 140 mmol/L (ref 135–145)
Total Bilirubin: 0.3 mg/dL (ref 0.3–1.2)
Total Protein: 6.8 g/dL (ref 6.5–8.1)

## 2023-03-05 LAB — CBC
HCT: 27.7 % — ABNORMAL LOW (ref 36.0–46.0)
Hemoglobin: 8.3 g/dL — ABNORMAL LOW (ref 12.0–15.0)
MCH: 26.9 pg (ref 26.0–34.0)
MCHC: 30 g/dL (ref 30.0–36.0)
MCV: 89.6 fL (ref 80.0–100.0)
Platelets: 295 10*3/uL (ref 150–400)
RBC: 3.09 MIL/uL — ABNORMAL LOW (ref 3.87–5.11)
RDW: 15.4 % (ref 11.5–15.5)
WBC: 7.1 10*3/uL (ref 4.0–10.5)
nRBC: 0 % (ref 0.0–0.2)

## 2023-03-05 LAB — CBG MONITORING, ED: Glucose-Capillary: 222 mg/dL — ABNORMAL HIGH (ref 70–99)

## 2023-03-05 LAB — PHOSPHORUS: Phosphorus: 4 mg/dL (ref 2.5–4.6)

## 2023-03-05 LAB — MAGNESIUM: Magnesium: 2.2 mg/dL (ref 1.7–2.4)

## 2023-03-05 MED ORDER — SENNOSIDES-DOCUSATE SODIUM 8.6-50 MG PO TABS
1.0000 | ORAL_TABLET | Freq: Two times a day (BID) | ORAL | Status: DC
  Administered 2023-03-05 – 2023-03-06 (×3): 1 via ORAL
  Filled 2023-03-05 (×3): qty 1

## 2023-03-05 MED ORDER — POLYETHYLENE GLYCOL 3350 17 G PO PACK
17.0000 g | PACK | Freq: Two times a day (BID) | ORAL | Status: DC
  Administered 2023-03-05 – 2023-03-06 (×3): 17 g via ORAL
  Filled 2023-03-05 (×4): qty 1

## 2023-03-05 MED ORDER — BISACODYL 5 MG PO TBEC: 5.0000 mg | DELAYED_RELEASE_TABLET | Freq: Every day | ORAL | Status: DC | PRN

## 2023-03-05 NOTE — Evaluation (Signed)
Physical Therapy Evaluation Patient Details Name: Amanda Porter MRN: [id number] DOB: 1946-11-09 Today's Date: 03/05/2023  History of Present Illness  77 y.o. female with medical history significant for dementia, hydrocephalus, recent admission for subdural hematoma with mass effect and rightward shift requiring emergent drain placement and VP shunt removal 02/03/23 due to shunt malfunction, was discharged with hospice care, COPD, lung cancer status post upper left lobectomy, former tobacco user, chronic anxiety/depression, hypertension, hyperlipidemia, type 2 diabetes, who presented to [facility] ED due to worsening altered mental status.  Clinical Impression  Pt admitted with above diagnosis. Mod assist for supine to sit and to pivot to recliner. Pt unable to come to full upright position with attempted sit to stand with RW. No family present. Pt unreliable historian so her prior level of function is not known.  She stated the year is "49", but was able to state she's in a hospital. 24/7 assistance recommended.  Pt currently with functional limitations due to the deficits listed below (see PT Problem List). Pt will benefit from acute skilled PT to increase their independence and safety with mobility to allow discharge.          Recommendations for follow up therapy are one component of a multi-disciplinary discharge planning process, led by the attending physician.  Recommendations may be updated based on patient status, additional functional criteria and insurance authorization.  Follow Up Recommendations Can patient physically be transported by private vehicle: No     Assistance Recommended at Discharge Frequent or constant Supervision/Assistance  Patient can return home with the following  A lot of help with walking and/or transfers;A lot of help with bathing/dressing/bathroom;Assistance with cooking/housework;Assist for transportation;Help with stairs or ramp for entrance;Assistance with  feeding;Direct supervision/assist for medications management;Direct supervision/assist for financial management    Equipment Recommendations None recommended by PT  Recommendations for Other Services       Functional Status Assessment Patient has had a recent decline in their functional status and demonstrates the ability to make significant improvements in function in a reasonable and predictable amount of time.     Precautions / Restrictions Precautions Precautions: Fall Restrictions Weight Bearing Restrictions: No      Mobility  Bed Mobility Overal bed mobility: Needs Assistance Bed Mobility: Supine to Sit     Supine to sit: Mod assist Sit to supine: Mod assist        Transfers Overall transfer level: Needs assistance Equipment used: Rolling walker (2 wheels), 1 person hand held assist Transfers: Sit to/from Stand, Bed to chair/wheelchair/BSC Sit to Stand: Mod assist Stand pivot transfers: Mod assist         General transfer comment: attempted sit to stand with RW, pt unable to come to full upright position with assist. Returned to EOB then performed SPT bed to recliner with mod assist.    Ambulation/Gait               General Gait Details: unable  Stairs            Wheelchair Mobility    Modified Rankin (Stroke Patients Only)       Balance Overall balance assessment: Needs assistance Sitting-balance support: Single extremity supported, Feet supported Sitting balance-Leahy Scale: Fair     Standing balance support: Bilateral upper extremity supported, During functional activity Standing balance-Leahy Scale: Poor                               Pertinent  Vitals/Pain Pain Assessment Faces Pain Scale: No hurt Breathing: normal Negative Vocalization: none Facial Expression: smiling or inexpressive Body Language: relaxed Consolability: no need to console PAINAD Score: 0 Facial Expression: Relaxed, neutral Body Movements:  Absence of movements Muscle Tension: Relaxed Compliance with ventilator (intubated pts.): N/A Vocalization (extubated pts.): N/A CPOT Total: 0    Home Living Family/patient expects to be discharged to:: Private residence Living Arrangements: Children Available Help at Discharge: Family;Available PRN/intermittently (daughter arranging 24/7 support) Type of Home: House Home Access: Stairs to enter Entrance Stairs-Rails: None Entrance Stairs-Number of Steps: 1   Home Layout: Able to live on main level with bedroom/bathroom Home Equipment: Rollator (4 wheels);Shower seat;Grab bars - tub/shower;Toilet riser;Wheelchair - manual Additional Comments: All prior history obtained from prior admission notes (02/14/23) due to no family member present to confirm details and pt is a poor historian/cog deficits. Lives with daughter (works full time) and grand child (autistic)    Prior Function Prior Level of Function : Needs assist             Mobility Comments: In December pt was modified independent ambulating with rollator but has had decline in status since January requiring assist for transfers and walking 10-20 feet (and 40-50 feet when going to adult day care). ADLs Comments: Pt reports assit required for ADL/IADL     Hand Dominance   Dominant Hand: Right    Extremity/Trunk Assessment   Upper Extremity Assessment Upper Extremity Assessment: Defer to OT evaluation    Lower Extremity Assessment Lower Extremity Assessment: Difficult to assess due to impaired cognition    Cervical / Trunk Assessment Cervical / Trunk Assessment: Kyphotic  Communication      Cognition Arousal/Alertness: Awake/alert Behavior During Therapy: Flat affect Overall Cognitive Status: No family/caregiver present to determine baseline cognitive functioning Area of Impairment: Memory, Following commands, Orientation, Attention, Awareness, Problem solving                 Orientation Level:  Disoriented to, Time, Situation     Following Commands: Follows one step commands inconsistently Safety/Judgement: Decreased awareness of safety, Decreased awareness of deficits   Problem Solving: Slow processing, Decreased initiation, Difficulty sequencing, Requires tactile cues, Requires verbal cues General Comments: cues to initiate mobility        General Comments      Exercises     Assessment/Plan    PT Assessment Patient needs continued PT services  PT Problem List Decreased strength;Decreased activity tolerance;Decreased balance;Decreased mobility;Decreased coordination;Decreased cognition;Decreased knowledge of use of DME;Decreased knowledge of precautions       PT Treatment Interventions DME instruction;Gait training;Stair training;Functional mobility training;Therapeutic activities;Therapeutic exercise;Balance training;Patient/family education    PT Goals (Current goals can be found in the Care Plan section)  Acute Rehab PT Goals PT Goal Formulation: Patient unable to participate in goal setting Time For Goal Achievement: 03/19/23 Potential to Achieve Goals: Good    Frequency Min 3X/week     Co-evaluation               AM-PAC PT "6 Clicks" Mobility  Outcome Measure Help needed turning from your back to your side while in a flat bed without using bedrails?: A Lot Help needed moving from lying on your back to sitting on the side of a flat bed without using bedrails?: A Lot Help needed moving to and from a bed to a chair (including a wheelchair)?: A Lot Help needed standing up from a chair using your arms (e.g., wheelchair or bedside chair)?: A  Lot Help needed to walk in hospital room?: Total Help needed climbing 3-5 steps with a railing? : Total 6 Click Score: 10    End of Session Equipment Utilized During Treatment: Gait belt Activity Tolerance: Patient tolerated treatment well Patient left: in chair;with call bell/phone within reach;with chair alarm  set Nurse Communication: Mobility status PT Visit Diagnosis: Other abnormalities of gait and mobility (R26.89);Muscle weakness (generalized) (M62.81);History of falling (Z91.81)    Time: [id number] PT Time Calculation (min) (ACUTE ONLY): 16 min   Charges:   PT Evaluation $PT Eval Moderate Complexity: 1 Mod         Philomena Doheny PT 03/05/2023  Acute Rehabilitation Services  Office [phone]

## 2023-03-05 NOTE — Progress Notes (Signed)
[facility] [employer]) [facility] Liaison Note   [name] is a hospitalized [facility] patient with a terminal hospice diagnosis of Obstructive hydrocephalus and was admitted on our services on 02/24/23.  On 03/04/23, the patient's Family elected to call EMS during a visit with hospice RN to have symptoms of altered mental status changes evaluated in the [facility] ED.  Patient is admitted with AMS which is a related admission per Dr. Karie Georges, an [facility] physician.    Visited patient in room with daughter at bedside. Patient had just seen PT/OT and was sitting up in NAD in the recliner watching television. Patient was pleasantly confused and denied complaints at this time. Daughter is waiting to see if the patient may qualify for skilled rehab to get back to baseline or whether patient may need LTC placement as family in unsure they can support patient in the home at this time. Supported daughter at bedside and encouraged to call for additional needs - family has [facility] contact information.   Patient is appropriate for inpatient level of care due to the need for skilled medical staff and assessment of symptoms being managed at this time.    V/S:  Temp 97.5, HR 78, RR 18, BP 133/61, sats 100% on 2L O2 via [place] I&O: 120/100 (+20) Abnormal lab work: RBC 3.09, Hemo 8.3, HCT 27.7. BUN 24, Glucose 139  Diagnostics:  CT head shows no new acute intracranial pathology, DG Chest Xray shows no active dx, MRI brain show no acute intracranial process/evidence of acute or subacute infarct.  IVs/PRNs: None adm this shift.   EPIC MD Problem list:  Principal Problem: AMS (altered mental status)  Acute metabolic encephalopathy versus progressive dementia, as cause of her progressive worsening mental status changes: MRI brain without acute findings.  UA not suggestive of UTI.  No mention of discharging on home hospice in inpatient rehab discharge notes.  Not sure if this was arranged sometime after.  This will need to  be verified with the [facility] team.  Agree with palliative care consultation to further clarify goals of care.  Currently only alert and oriented x 1.  Overall poor prognosis.  Delirium precautions.  Adult failure to thrive: Multifactorial due to advanced age, progressive dementia, multiple significant comorbidities.  Palliative care consultation to address goals of cares.  PT and OT evaluation. Constipation: Diffuse large amount of colonic stool, gas and stool in the rectum on KUB.  S/p glycerin suppository and mineral oil enema on admission.  Aggressive bowel regimen.  Unclear if she has had a BM thus far.  This may well be contributing to her confusion as well. Normocytic anemia/iron deficiency anemia: Current hemoglobin appears to be at her previous baseline.  Dropped from 9.3 on admission to 8.32 days likely dilutional.  Continue ferrous sulfate supplements. Essential hypertension: Continue amlodipine 2.5 Mg daily.  Controlled. Hyperlipidemia: Continue rosuvastatin 20 mg every afternoon. Advanced dementia: Continue home dose of Namenda. Type II DM with hyperglycemia: A1c 6.8 on 01/22/2023.  Holding home hypoglycemics.  Continue SSI.  Reasonable inpatient control. GERD Continue PPI. Recent subdural hematoma s/p emergent right frontal burr hole for evacuation and VP shunt removal 02/03/2023.  Body mass index is 17.26 kg/m. Palliative care medicine Consulted   D/C planning- Ongoing assessment - Rehab vs LTC placement disposition.   Please use [facility] for all transportation needs upon hospital discharge.  Goals of Care: Ongoing discussions  - Pt is a DNR and family wishes continued support of hospice at this  time while they decide whether to seek tx at skilled rehab to asst patient back up close to baseline. Awaiting PT/OT evaluation.  Communication with IDT- Made AOR aware of hospitalization. [facility] team updated on patient condition. [facility] Medication list placed on chart. Communication with PCG:  Supported patient's dtr at bedside - aware that [facility] HLT will visit patient daily during this hospitalization.   Please call with any hospice related questions/concerns,   Gar Ponto, RN [place] (in [place]) [phone]

## 2023-03-05 NOTE — TOC Progression Note (Signed)
Transition of Care [facility]) - Progression Note    Patient Details  Name: Amanda Porter MRN: [id number] Date of Birth: Apr 07, 1946  Transition of [facility]) CM/SW Contact  Steel Kerney, Juliann Pulse, RN Phone Number: 03/05/2023, 3:00 PM  Clinical Narrative:  spoke to dtr Ashley-she is in agreement for ST SNF;contacted [facility] rep Misty-she will follow along.continue to monitor for d/c needs.     Expected Discharge Plan: Skilled Nursing Facility Barriers to Discharge: SNF Pending Medicaid  Expected Discharge Plan and Services                                               Social Determinants of Health (SDOH) Interventions SDOH Screenings   Food Insecurity: No Food Insecurity (12/23/2022)  Housing: Low Risk  (12/23/2022)  Transportation Needs: No Transportation Needs (12/23/2022)  Utilities: Not At Risk (10/20/2022)  Depression (PHQ2-9): High Risk (01/22/2023)  Financial Resource Strain: Low Risk  (10/20/2022)  Stress: No Stress Concern Present (10/20/2022)  Tobacco Use: Medium Risk (03/04/2023)    Readmission Risk Interventions     No data to display

## 2023-03-05 NOTE — NC FL2 (Signed)
[place] LEVEL OF CARE FORM     IDENTIFICATION  Patient Name: Amanda Porter Birthdate: 1946/05/05 Sex: female Admission Date (Current Location): 03/04/2023  [facility] and [place] Number:  Herbalist and Address:  [facility],  [address] [place], [place]      Provider Number: [id number]  Attending Physician Name and Address:  Modena Jansky, MD  Relative Name and Phone Number:  Arriana Zamudio (dtr) [phone]    Current Level of Care: Hospital Recommended Level of Care: [place] Prior Approval Number:    Date Approved/Denied:   PASRR Number: [id number]  Discharge Plan: SNF    Current Diagnoses: Patient Active Problem List   Diagnosis Date Noted   AMS (altered mental status) 03/04/2023   SDH (subdural hematoma) 02/13/2023   Altered mental status 02/05/2023   Subdural hematoma 02/03/2023   Compression of brain [id number]   Complication of ventricular intracranial shunt 02/03/2023   Malnutrition of moderate degree 12/18/2022   COPD (chronic obstructive pulmonary disease) 12/16/2022   Diabetes mellitus without complication [id number]   GERD (gastroesophageal reflux disease) 12/16/2022   Fall at home, initial encounter 12/16/2022   Angiodysplasia of stomach and duodenum 12/16/2022   Bleeding duodenal ulcer 12/16/2022   Status post right hip replacement 10/13/2022   Shunt malfunction    Closed displaced fracture of right femoral neck 09/06/2022   Multiple lung nodules 08/03/2022   Acute urinary retention 08/01/2022   Normocytic anemia 07/29/2022   Recurrent UTI 06/24/2022   Recurrent falls 06/24/2022   DNR (do not resuscitate) 06/24/2022   S/P VP shunt 04/22/2022   Pleural effusion on left 04/08/2022   Decreased urine output 04/04/2022   Vitamin D deficiency 04/04/2022   Other fatigue 04/04/2022   Hypokalemia 04/04/2022   Urinary retention 12/13/2021   Wrist fracture, closed, left, sequela 12/13/2021    Protein-calorie malnutrition, severe 11/07/2021   Tardive dyskinesia 02/01/2020   Cervical spondylolysis 05/24/2019   Chronic respiratory failure with hypoxia 02/25/2019   Postural dizziness 08/11/2018   Decreased hemoglobin 07/02/2018   Anemia 04/12/2018   Type 2 diabetes mellitus with hyperglycemia 02/18/2018   Pain of left lower extremity 11/26/2017   Memory disturbance 11/20/2017   Right carotid bruit 11/20/2017   Hyperlipidemia 09/14/2017   COPD mixed type 09/14/2017   Dementia without behavioral disturbance 09/14/2017   Normal pressure hydrocephalus 09/14/2017   Dysphagia 09/14/2017   Primary hypertension 09/14/2017   History of CVA (cerebrovascular accident) 09/14/2017   Anxiety and depression 09/14/2017   Tobacco use disorder 12/10/2016    Orientation RESPIRATION BLADDER Height & Weight     Self  O2 (2LNC) Incontinent Weight: 42.8 kg Height:  5\' 2"  (157.5 cm)  BEHAVIORAL SYMPTOMS/MOOD NEUROLOGICAL BOWEL NUTRITION STATUS      Incontinent Diet (heart healthy, carb modified)  AMBULATORY STATUS COMMUNICATION OF NEEDS Skin   Supervision Verbally Normal (fluid-filled blisters to right face)                       Personal Care Assistance Level of Assistance  Bathing, Feeding, Dressing Bathing Assistance: Limited assistance Feeding assistance: Limited assistance Dressing Assistance: Limited assistance     Functional Limitations Info  Sight, Hearing, Speech Sight Info: Impaired (glasses) Hearing Info: Impaired (hearing aids) Speech Info: Adequate    SPECIAL CARE FACTORS FREQUENCY  PT (By licensed PT), OT (By licensed OT)     PT Frequency: 5x/week OT Frequency: 5x/week  Contractures Contractures Info: Present (Left and Right lower extremities)    Additional Factors Info  Code Status, Allergies, Insulin Sliding Scale Code Status Info: DNR Allergies Info: : Neosporin (Bacitracin-polymyxin B), Lexapro (Escitalopram Oxalate)   Insulin Sliding  Scale Info: See MAR       Current Medications (03/05/2023):  This is the current hospital active medication list Current Facility-Administered Medications  Medication Dose Route Frequency Provider Last Rate Last Admin   acetaminophen (TYLENOL) tablet 650 mg  650 mg Oral Q6H PRN Irene Pap N, DO       amLODipine (NORVASC) tablet 2.5 mg  2.5 mg Oral Daily Hall, Carole N, DO   2.5 mg at 03/05/23 1026   aspirin chewable tablet 81 mg  81 mg Oral Daily Irene Pap N, DO   81 mg at 03/05/23 1036   bisacodyl (DULCOLAX) EC tablet 5 mg  5 mg Oral Daily PRN Modena Jansky, MD       buPROPion (WELLBUTRIN XL) 24 hr tablet 300 mg  300 mg Oral Q breakfast Irene Pap N, DO   300 mg at 03/05/23 0845   ferrous sulfate tablet 325 mg  325 mg Oral Q breakfast Kayleen Memos, DO   325 mg at [id number]   folic acid (FOLVITE) tablet 1 mg  1 mg Oral Daily Irene Pap N, DO   1 mg at 03/05/23 1026   insulin aspart (novoLOG) injection 0-5 Units  0-5 Units Subcutaneous QHS Irene Pap N, DO   2 Units at 03/05/23 0030   insulin aspart (novoLOG) injection 0-9 Units  0-9 Units Subcutaneous TID WC Irene Pap N, DO   2 Units at 03/05/23 1235   lactobacillus (FLORANEX/LACTINEX) granules 1 g  1 g Oral Daily Irene Pap N, DO   1 g at 03/05/23 1125   melatonin tablet 10 mg  10 mg Oral QHS Irene Pap N, DO   10 mg at 03/05/23 0045   memantine (NAMENDA) tablet 5 mg  5 mg Oral BID Irene Pap N, DO   5 mg at 03/05/23 1029   multivitamin with minerals tablet 1 tablet  1 tablet Oral Daily Irene Pap N, DO   1 tablet at 03/05/23 1024   pantoprazole (PROTONIX) EC tablet 40 mg  40 mg Oral Daily Irene Pap N, DO   40 mg at 03/05/23 1025   polyethylene glycol (MIRALAX / GLYCOLAX) packet 17 g  17 g Oral BID Modena Jansky, MD   17 g at 03/05/23 1250   prochlorperazine (COMPAZINE) injection 5 mg  5 mg Intravenous Q6H PRN Irene Pap N, DO       rosuvastatin (CRESTOR) tablet 20 mg  20 mg Oral QPM Hall, Carole N, DO        senna-docusate (Senokot-S) tablet 1 tablet  1 tablet Oral BID Modena Jansky, MD   1 tablet at 03/05/23 1239     Discharge Medications: Please see discharge summary for a list of discharge medications.  Relevant Imaging Results:  Relevant Lab Results:   Additional Information SSN [id number]  Henrietta Dine, RN

## 2023-03-05 NOTE — Consult Note (Signed)
Consultation Note Date: 03/05/2023   Patient Name: Amanda Porter  DOB: Aug 23, 1946  MRN: [id number]  Age / Sex: 77 y.o., female  PCP: Pleas Koch, NP Referring Physician: Modena Jansky, MD  Reason for Consultation: Establishing goals of care  HPI/Patient Profile: 77 y.o. female      admitted on 03/04/2023   Clinical Assessment and Goals of Care: 77 year old lady with past medical history of COPD lung cancer status post lobectomy, former smoker, chronic anemia anxiety depression dementia history of stroke with left-sided weakness diabetes hypertension dyslipidemia tardive dyskinesia recurrent urinary tract infections.  Patient is status post right traumatic head injury in October 2023, status post right VP shunt replacement for NPH, was hospitalized in March for increased confusion and gait disorder where CT head showed a right subdural hematoma and patient underwent right frontal burr hole for drainage of subdural hematoma and VP shunt was removed.  Patient subsequently spent some time at [facility] inpatient rehab from 3-15 through 02-23-2023. Patient was recently assigned to home-based hospice services however for the last 4-5 days, the patient was noted to be more lethargic, more confused, not eating and was brought in by her daughter who works in EMS for additional evaluation.  Patient is admitted to hospital medicine service for acute metabolic encephalopathy in the setting of progressive dementia, adult failure to thrive, constipation, with imaging showing large amount of colonic stool gas.  Services are following, TOC following and patient has also been recommended for palliative consult for ongoing goals of care discussions.   HCPOA Daughter [name]  [phone].  Discussion [place] OF RECOMMENDATIONS   Patient seen and examined, I met with the patient and her daughter [name] who was present at  bedside. Palliative medicine is specialized medical care for people living with serious illness. It focuses on providing relief from the symptoms and stress of a serious illness. The goal is to improve quality of life for both the patient and the family. Goals of care: Broad aims of medical therapy in relation to the patient's values and preferences. Our aim is to provide medical care aimed at enabling patients to achieve the goals that matter most to them, given the circumstances of their particular medical situation and their constraints.   Goals wishes and values attempted to be explored.  Brief life review performed.  Patient's daughter [name] is her healthcare power of attorney, only child and patient lives with her.  Patient has had multifactorial progressive functional decline because of several comorbidities frail health deconditioning and recurrent hospitalizations.  We discussed about the option of continuing hospice support at home versus skilled nursing facility for rehabilitation attempt.  Patient recently spent some time at [facility] inpatient rehab, patient has been to various skilled nursing facilities for rehabilitation attempts over the course of the past couple of years as well.  More recently she has been at home and hospice services had been added recently. Patient's daughter wishes to discuss with TOC, she believes that she might to pursue a short-term skilled nursing facility  rehabilitation trial with palliative services prior to bringing the patient back home with hospice services. Patient had a bowel movement earlier today.  Continue current bowel regimen.  Thank you for the consult.  Code Status/Advance Care Planning: DNR   Symptom Management:     Palliative Prophylaxis:  Aspiration    Psycho-social/Spiritual:  Desire for further Chaplaincy support:yes Additional Recommendations: Education on Hospice  Prognosis:  Guarded   Discharge Planning: [place]  for rehab with Palliative care service follow-up      Primary Diagnoses: Present on Admission:  AMS (altered mental status)   I have reviewed the medical record, interviewed the patient and family, and examined the patient. The following aspects are pertinent.  Past Medical History:  Diagnosis Date   Acute respiratory failure requiring reintubation 11/05/2021   Anemia    Anxiety    Anxiety and depression    COPD (chronic obstructive pulmonary disease)    CVA (cerebral vascular accident)    CVA (cerebral vascular accident)    L sided deficits   Dementia    Depression    Diabetes mellitus    Type 2   Diabetes mellitus without complication    Dysphagia    Dysphasia    Falls    GERD (gastroesophageal reflux disease)    HCAP (healthcare-associated pneumonia) 12/16/2022   History of lung cancer    Hyperlipidemia    Hypertension    Influenza A 12/13/2021   Memory disturbance 11/20/2017   Normal pressure hydrocephalus 2018   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   Social History   Socioeconomic History   Marital status: Divorced    Spouse name: Not on file   Number of children: 1   Years of education: trade school   Highest education level: Not on file  Occupational History   Occupation: Retired  Tobacco Use   Smoking status: Former    Packs/day: 0.50    Years: 50.00    Additional pack years: 0.00    Total pack years: 25.00    Types: Cigarettes    Quit date: 08/01/2017    Years since quitting: 5.5    Passive exposure: Past   Smokeless tobacco: Never  Vaping Use   Vaping Use: Never used  Substance and Sexual Activity   Alcohol use: Not Currently   Drug use: Not Currently   Sexual activity: Not Currently  Other Topics Concern   Not on  file  Social History Narrative   ** Merged History Encounter **       Moved from [place]. Lives with daughter. Right handed    Social Determinants of Health   Financial Resource Strain: Low Risk  (10/20/2022)   Overall Financial Resource Strain (CARDIA)    Difficulty of Paying Living Expenses: Not very hard  Food Insecurity: No Food Insecurity (12/23/2022)   Hunger Vital Sign    Worried About Running Out of Food in the Last Year: Never true    Ran Out of Food in the Last Year: Never true  Transportation Needs: No Transportation Needs (12/23/2022)   PRAPARE - Hydrologist (Medical): No    Lack of Transportation (Non-Medical): No  Physical Activity: Not on file  Stress: No Stress  Concern Present (10/20/2022)   [place]    Feeling of Stress : Not at all  Social Connections: Not on file   Family History  Problem Relation Age of Onset   Pneumonia Mother    Cancer Father    Hyperlipidemia Sister    Hypertension Sister    Heart disease Sister    Diabetes Sister    Hypertension Sister    Diabetes Sister    Pancreatic cancer Sister    Throat cancer Brother    Cancer Brother    Cancer Brother    Breast cancer Neg Hx    Scheduled Meds:  amLODipine  2.5 mg Oral Daily   aspirin  81 mg Oral Daily   buPROPion  300 mg Oral Q breakfast   ferrous sulfate  325 mg Oral Q breakfast   folic acid  1 mg Oral Daily   insulin aspart  0-5 Units Subcutaneous QHS   insulin aspart  0-9 Units Subcutaneous TID WC   lactobacillus  1 g Oral Daily   melatonin  10 mg Oral QHS   memantine  5 mg Oral BID   multivitamin with minerals  1 tablet Oral Daily   pantoprazole  40 mg Oral Daily   polyethylene glycol  17 g Oral BID   rosuvastatin  20 mg Oral QPM   senna-docusate  1 tablet Oral BID   Continuous Infusions: PRN Meds:.acetaminophen, bisacodyl, prochlorperazine Medications Prior to Admission:  Prior  to Admission medications   Medication Sig Start Date End Date Taking? Authorizing Provider  acetaminophen (TYLENOL) 500 MG tablet Take 1,000 mg by mouth every 6 (six) hours as needed for moderate pain.   Yes [provider]  albuterol (VENTOLIN HFA) 108 (90 Base) MCG/ACT inhaler INHALE 2 PUFFS BY MOUTH EVERY 4 HOURS AS NEEDED Patient taking differently: Inhale 3-4 puffs into the lungs 2 (two) times daily as needed for wheezing or shortness of breath. 02/22/23  Yes Angiulli, Lavon Paganini, PA-C  Albuterol Sulfate 2.5 MG/0.5ML NEBU Inhale 2.5 mg into the lungs as needed (shortness of breath).   Yes [provider]  amLODipine (NORVASC) 2.5 MG tablet Take 1 tablet (2.5 mg total) by mouth daily. for blood pressure. 02/22/23  Yes Angiulli, Lavon Paganini, PA-C  ascorbic acid (VITAMIN C) 500 MG tablet Take 1 tablet (500 mg total) by mouth daily. 12/19/22 03/19/23 Yes Val Riles, MD  aspirin 81 MG chewable tablet Chew 1 tablet (81 mg total) by mouth daily. Hold for now and resume on 12/22/22 12/22/22  Yes Val Riles, MD  Baclofen 5 MG TABS Take 1 tablet (5 mg total) by mouth daily as needed for muscle spasms. Patient taking differently: Take 5 mg by mouth 2 (two) times daily as needed for muscle spasms. 02/23/23  Yes Angiulli, Lavon Paganini, PA-C  buPROPion (WELLBUTRIN XL) 300 MG 24 hr tablet Take 1 tablet (300 mg total) by mouth daily with breakfast. for anxiety and depression. 02/22/23  Yes Angiulli, Lavon Paganini, PA-C  busPIRone (BUSPAR) 30 MG tablet Take 1 tablet (30 mg total) by mouth 2 (two) times daily. 02/23/23  Yes Angiulli, Lavon Paganini, PA-C  butalbital-acetaminophen-caffeine (FIORICET) 50-325-40 MG tablet Take 1 tablet by mouth every 8 (eight) hours as needed for headache. Patient taking differently: Take 1 tablet by mouth as needed for headache. 02/23/23  Yes Angiulli, Lavon Paganini, PA-C  cephALEXin (KEFLEX) 500 MG capsule Take 1 capsule (500 mg total) by mouth every 12 (twelve) hours. 02/23/23  Yes Angiulli,  Lavon Paganini, PA-C  clonazePAM (KLONOPIN) 1 MG tablet Take 1 mg by mouth at bedtime.   Yes [provider]  conjugated estrogens (PREMARIN) vaginal cream Apply one pea-sized amount around the opening of the urethra daily for 2 weeks, then 3 times weekly moving forward. Patient taking differently: Place 1 Application vaginally 3 (three) times a week. Apply one pea-sized amount around the opening of the urethra daily 08/20/22  Yes Vaillancourt, Aldona Bar, PA-C  ferrous sulfate 325 (65 FE) MG tablet Take 1 tablet (325 mg total) by mouth daily with breakfast. Patient taking differently: Take 325 mg by mouth at bedtime. 02/22/23 05/23/23 Yes Angiulli, Lavon Paganini, PA-C  folic acid (FOLVITE) 1 MG tablet Take 1 tablet (1 mg total) by mouth daily. 02/22/23  Yes Angiulli, Lavon Paganini, PA-C  glipiZIDE (GLUCOTROL XL) 2.5 MG 24 hr tablet Take 1 tablet (2.5 mg total) by mouth daily with breakfast. For diabetes. 02/22/23  Yes Angiulli, Lavon Paganini, PA-C  ibuprofen (ADVIL) 200 MG tablet Take 400 mg by mouth as needed for moderate pain.   Yes [provider]  memantine (NAMENDA) 5 MG tablet Take 1 tablet (5 mg total) by mouth 2 (two) times daily. For memory. 02/23/23  Yes Angiulli, Lavon Paganini, PA-C  pantoprazole (PROTONIX) 40 MG tablet Take 1 tablet (40 mg total) by mouth daily. 02/23/23 05/24/23 Yes Angiulli, Lavon Paganini, PA-C  rosuvastatin (CRESTOR) 20 MG tablet Take 1 tablet (20 mg total) by mouth every evening. For cholesterol. 02/23/23  Yes Angiulli, Lavon Paganini, PA-C  Tiotropium Bromide-Olodaterol (STIOLTO RESPIMAT) 2.5-2.5 MCG/ACT AERS Inhale 2 each into the lungs in the morning.   Yes [provider]  lactobacillus (FLORANEX/LACTINEX) PACK Take 1 packet (1 g total) by mouth daily. Patient not taking: Reported on 03/05/2023 02/23/23   Angiulli, Lavon Paganini, PA-C  Melatonin 10 MG TABS Take 10 mg by mouth at bedtime. Patient not taking: Reported on 03/05/2023 02/23/23   Cathlyn Parsons, PA-C  Multiple Vitamin  (MULTIVITAMIN WITH MINERALS) TABS tablet Take 1 tablet by mouth daily. Patient not taking: Reported on 03/05/2023 02/23/23   Angiulli, Lavon Paganini, PA-C  PROLIA 60 MG/ML SOSY injection Inject 60 mg into the skin every 6 (six) months. Patient not taking: Reported on 03/05/2023 01/27/23   [provider]   Allergies  Allergen Reactions   Neosporin [Bacitracin-Polymyxin B] Rash    Really bad rash   Lexapro [Escitalopram Oxalate] Other (See Comments)    Dizziness, nausea, fatigue   Review of Systems +confused at times +weakness  Physical Exam Elderly appearing lady resting in chair, is able to feed herself breakfast Episodically confused but answers a few questions appropriately Appears frail and chronically ill Does not appear to be in any acute distress Regular work of breathing Abdomen is not distended and next line has some contractures  Vital Signs: BP 133/61 (BP Location: Right Arm)   Pulse 78   Temp (!) 97.5 F (36.4 C) (Oral)   Resp 18   Ht 5\' 2"  (1.575 m)   Wt 42.8 kg   SpO2 100%   BMI 17.26 kg/m  Pain Scale: 0-10   Pain Score: 3    SpO2: SpO2: 100 % O2 Device:SpO2: 100 % O2 Flow Rate: .O2 Flow Rate (L/min): 2 L/min  IO: Intake/output summary:  Intake/Output Summary (Last 24 hours) at 03/05/2023 1515 Last data filed at 03/05/2023 0930 Gross per 24 hour  Intake 620 ml  Output 100 ml  Net 520 ml  LBM: Last BM Date : 03/05/23 Baseline Weight: Weight: 41.7 kg Most recent weight: Weight: 42.8 kg     Palliative Assessment/Data:   PPS 50%  Time In:  1400 Time Out:  1500 Time Total:  60 min.  Greater than 50%  of this time was spent counseling and coordinating care related to the above assessment and plan.  Signed by: Loistine Chance, MD   Please contact Palliative Medicine Team phone at [phone] for questions and concerns.  For individual provider: See Shea Evans

## 2023-03-05 NOTE — Evaluation (Signed)
 Occupational Therapy Evaluation Patient Details Name: Amanda Porter MRN: [id number] DOB: [birth year] Today's Date: 03/05/2023   History of Present Illness 77 y.o. female with medical history significant for dementia, hydrocephalus, recent admission for subdural hematoma with mass effect and rightward shift requiring emergent drain placement and VP shunt removal 02/03/23 due to shunt malfunction, was discharged with hospice care, COPD, lung cancer status post upper left lobectomy, former tobacco user, chronic anxiety/depression, hypertension, hyperlipidemia, type 2 diabetes, who presented to [facility] ED due to worsening altered mental status.   Clinical Impression   PTA, pt lives with daughter, typically ambulatory with Rollator and able to manage ADLs w/ light assistance. Pt with recent CIR DC, initially doing well then with sharp decline requiring Max A for all ADLs/transfers. Today, pt requires Mod A for standing w/ RW and noted posterior lean. Pt also requires up to Total A for LB ADLs d/t deficits in cognition, balance and overall strength. Pt's daughter hopeful pt can progress to light transfers to/from a wheelchair in order to attend programs at adult day center. Family also interested in discussing hospice benefits vs trial of ST rehab - MD/CM aware.      Recommendations for follow up therapy are one component of a multi-disciplinary discharge planning process, led by the attending physician.  Recommendations may be updated based on patient status, additional functional criteria and insurance authorization.   Assistance Recommended at Discharge Frequent or constant Supervision/Assistance  Patient can return home with the following A lot of help with walking and/or transfers;A lot of help with bathing/dressing/bathroom    Functional Status Assessment  Patient has had a recent decline in their functional status and demonstrates the ability to make significant improvements in function in a reasonable  and predictable amount of time.  Equipment Recommendations  BSC/3in1;Hospital bed    Recommendations for Other Services       Precautions / Restrictions Precautions Precautions: Fall Precaution Comments: hx of L frozen shoulder and impaired L hand ROM Restrictions Weight Bearing Restrictions: No      Mobility Bed Mobility               General bed mobility comments: in recliner on entry    Transfers Overall transfer level: Needs assistance Equipment used: Rolling walker (2 wheels) Transfers: Sit to/from Stand Sit to Stand: Mod assist           General transfer comment: light Mod to heavy Mod for standing trials with RW. max cues for hand placement with variable follow through. with fatigue, progression posterior bias noted      Balance Overall balance assessment: Needs assistance Sitting-balance support: Single extremity supported, Feet supported Sitting balance-Leahy Scale: Fair     Standing balance support: Bilateral upper extremity supported, During functional activity Standing balance-Leahy Scale: Poor                             ADL either performed or assessed with clinical judgement   ADL Overall ADL's : Needs assistance/impaired Eating/Feeding: Set up;Sitting   Grooming: Sitting;Minimal assistance   Upper Body Bathing: Maximal assistance;Sitting   Lower Body Bathing: Total assistance;Sit to/from stand   Upper Body Dressing : Maximal assistance;Sitting   Lower Body Dressing: Total assistance;Sit to/from stand       Toileting- Water quality scientist and Hygiene: Total assistance;Sit to/from stand         General ADL Comments: Noted posterior bias in standing, impaired L UE use and impaired  cognition     Vision Baseline Vision/History: 1 Wears glasses Ability to See in Adequate Light: 1 Impaired Patient Visual Report: No change from baseline Vision Assessment?: No apparent visual deficits     Perception     Praxis       Pertinent Vitals/Pain Pain Assessment Pain Assessment: No/denies pain     Hand Dominance Right   Extremity/Trunk Assessment Upper Extremity Assessment Upper Extremity Assessment: Generalized weakness;LUE deficits/detail LUE Deficits / Details: H/o L frozen shoulder with decreased ROM baseline. unable to really flex digits or grasp with L hand   Lower Extremity Assessment Lower Extremity Assessment: Defer to PT evaluation   Cervical / Trunk Assessment Cervical / Trunk Assessment: Kyphotic   Communication Communication Communication: Receptive difficulties;Expressive difficulties   Cognition Arousal/Alertness: Awake/alert Behavior During Therapy: Flat affect Overall Cognitive Status: Impaired/Different from baseline Area of Impairment: Memory, Following commands, Orientation, Attention, Awareness, Problem solving, Safety/judgement                 Orientation Level: Disoriented to, Time, Situation Current Attention Level: Sustained Memory: Decreased recall of precautions, Decreased short-term memory Following Commands: Follows one step commands inconsistently Safety/Judgement: Decreased awareness of safety, Decreased awareness of deficits Awareness: Intellectual Problem Solving: Slow processing, Decreased initiation, Difficulty sequencing, Requires tactile cues, Requires verbal cues General Comments: self limiting at times, would do opposite of what was instructed at times, benefits from tactile cues     General Comments  Family present and able to provide background info    Exercises     Shoulder Instructions      Home Living Family/patient expects to be discharged to:: Private residence Living Arrangements: Children;Other relatives (granddaughter) Available Help at Discharge: Family;Available PRN/intermittently (daughter working on arranging 24/7 support) Type of Home: House Home Access: Stairs to enter Technical  of Steps: 1 Entrance  Stairs-Rails: None Home Layout: Able to live on main level with bedroom/bathroom     Bathroom Shower/Tub: Teacher, early years/pre: Handicapped height Bathroom Accessibility: Yes How Accessible: Accessible via walker Home Equipment: Rollator (4 wheels);Shower seat;Grab bars - tub/shower;Toilet riser;Wheelchair - Publishing copy (2 wheels)   Additional Comments: All prior history obtained from prior admission notes (02/14/23) due to no family member present to confirm details and pt is a poor historian/cog deficits. Lives with daughter (works full time) and grand child (autistic)  Lives With: Daughter;Other (Comment) (granddaughter)    Prior Functioning/Environment Prior Level of Function : Needs assist             Mobility Comments: Recent DC from CIR (last monday per daughter), was ambulatory with walker until sharp decline where she required Max  A for transfers and would lean back ADLs Comments: Recent DC from CIR where light assist needed for ADLs but then sharp decline requiring Max A        OT Problem List: Decreased strength;Decreased range of motion;Impaired balance (sitting and/or standing);Decreased activity tolerance;Decreased safety awareness      OT Treatment/Interventions: Self-care/ADL training;Therapeutic exercise;Energy conservation;DME and/or AE instruction;Therapeutic activities;Patient/family education;Balance training;Neuromuscular education    OT Goals(Current goals can be found in the care plan section) Acute Rehab OT Goals Patient Stated Goal: daughter would like to discuss further on benefits of rehab vs home, would like pt to be able to transfer to/from wheelchair to go to adult day care OT Goal Formulation: With patient/family Time For Goal Achievement: 03/19/23 Potential to Achieve Goals: Fair  OT Frequency: Min 2X/week    Co-evaluation  AM-PAC OT "6 Clicks" Daily Activity     Outcome Measure Help from another  person eating meals?: A Little Help from another person taking care of personal grooming?: A Little Help from another person toileting, which includes using toliet, bedpan, or urinal?: Total Help from another person bathing (including washing, rinsing, drying)?: A Lot Help from another person to put on and taking off regular upper body clothing?: A Lot Help from another person to put on and taking off regular lower body clothing?: Total 6 Click Score: 12   End of Session Equipment Utilized During Treatment: Rolling walker (2 wheels);Gait belt  Activity Tolerance: Patient tolerated treatment well;Patient limited by fatigue Patient left: in chair;with call bell/phone within reach;with chair alarm set;with family/visitor present  OT Visit Diagnosis: Other abnormalities of gait and mobility (R26.89);Muscle weakness (generalized) (M62.81)                Time: [id number] OT Time Calculation (min): 24 min Charges:  OT General Charges $OT Visit: 1 Visit OT Evaluation $OT Eval Moderate Complexity: 1 Mod OT Treatments $Self Care/Home Management : 8-22 mins  Malachy Chamber, OTR/L Acute Rehab Services Office: [phone]   Layla Maw 03/05/2023, 12:49 PM

## 2023-03-05 NOTE — Progress Notes (Addendum)
PROGRESS NOTE   Amanda Porter  [id number]    DOB: 04-15-1946    DOA: 03/04/2023  PCP: Pleas Koch, NP   I have briefly reviewed patients previous medical records in [facility].  Chief Complaint  Patient presents with   Failure To Thrive   Fatigue    Brief Narrative:  77 year old female, medical history significant for COPD, lung cancer s/p left upper lobe lobectomy, former smoker, chronic anemia, anxiety with depression, dementia, CVA with left hemiparesis, type II DM, HLD, HTN, tardive dyskinesia, recurrent UTIs, right THA 09/07/2022, right VP shunt replacement for NPH, admitted to [facility] 02/03/2023 with increased confusion and gait disorder, CT head showed right subdural hematoma significantly increased in size since 01/12/2023 with rightward midline shift.  She underwent right frontal burr hole for drainage of subdural hematoma on 02/03/2023 by Dr. Cari Caraway and VP shunt was removed.  She was discharged and admitted to rehab 02/13/2023 - 02/23/2023.  Per admitting MD, at home on hospice care, presented to the ED on 03/04/2023 with progressive confusion/altered mental status.  MRI brain without acute findings.  Admitted for acute metabolic encephalopathy versus suspected progression of dementia as cause of her worsening confusion and failure to thrive.  Palliative care medicine consulted.   Assessment & Plan:  Principal Problem:   AMS (altered mental status)   Acute metabolic encephalopathy versus progressive dementia, as cause of her progressive worsening mental status changes: MRI brain without acute findings.  UA not suggestive of UTI.  No mention of discharging on home hospice in inpatient rehab discharge notes.  Not sure if this was arranged sometime after.  This will need to be verified with the [facility] team.  Agree with palliative care consultation to further clarify goals of care.  Currently only alert and oriented x 1.  Overall poor prognosis.  Delirium precautions.  Adult failure  to thrive: Multifactorial due to advanced age, progressive dementia, multiple significant comorbidities.  Palliative care consultation to address goals of cares.  PT and OT evaluation.  Constipation: Diffuse large amount of colonic stool, gas and stool in the rectum on KUB.  S/p glycerin suppository and mineral oil enema on admission.  Aggressive bowel regimen.  Unclear if she has had a BM thus far.  This may well be contributing to her confusion as well.  Normocytic anemia/iron deficiency anemia: Current hemoglobin appears to be at her previous baseline.  Dropped from 9.3 on admission to 8.32 days likely dilutional.  Continue ferrous sulfate supplements.  Essential hypertension: Continue amlodipine 2.5 Mg daily.  Controlled.  Hyperlipidemia: Continue rosuvastatin 20 mg every afternoon.  Advanced dementia: Continue home dose of Namenda.  Type II DM with hyperglycemia: A1c 6.8 on 01/22/2023.  Holding home hypoglycemics.  Continue SSI.  Reasonable inpatient control.  GERD Continue PPI.  Recent subdural hematoma s/p emergent right frontal burr hole for evacuation and VP shunt removal 02/03/2023.  Adult failure to thrive: Multifactorial due to very advanced age, multiple severe comorbidities, frail physical health and deconditioning.  As per my discussion with patient's daughter, prior to her first hospitalization in early February, she was alert and oriented x 4.  However since February, she appears to have achieved "a new baseline" mental status with ongoing confusion.  For the first 4 days after return from CIR, she reportedly was doing better, able to sit up by herself, ambulate with the help of a walker, completed a course of Keflex for UTI.  However thereafter, she became progressively more confused, lethargic, had  fever up to 102 F, intermittently agitated, poor oral intake, weak and daughter was unable to manage her at home.  Hospice has been only involved for the last week.  Daughter  indicated that they were not sure if she was at end-of-life versus some acute medical condition going on, reportedly did not qualify for residential hospice, eventually brought to the hospital for medical evaluation.  Daughter has a 44-year-old autistic child.  She currently states that she is not able to manage the patient's current high needs.  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  However, she wants to discuss further with case management to see what her options are.  Body mass index is 17.26 kg/m.  DVT prophylaxis:   Added subcutaneous Lovenox   Code Status: DNR:  ACP Documents: Reviewed MOST form, states to attempt CPR/antibiotics and IV fluids if indicated/feeding tube and full scope of treatment but currently DNR which is probably most appropriate.  Will await palliative care team input.  Has a legal guardian and healthcare power of attorney.  As per admitting MD, patient had a DNR form in the room. Family Communication: None at bedside Disposition:  Status is: Inpatient Remains inpatient appropriate because: Await palliative care consultation and to address disposition issues.     Consultants:   Palliative care medicine  Procedures:     Antimicrobials:      Subjective:  Sleeping in bed.  Alert and oriented only to self.  Very poor and limited history.  Says that she is in "[facility]".  She does not know that she is in the hospital.  For most of the questions she only answers with "really no".  Claims that she drank some milk but none seen on her breakfast table which is untouched.  Objective:   Vitals:   03/05/23 0100 03/05/23 0115 03/05/23 0350 03/05/23 0900  BP: (!) 115/51 (!) 148/61 (!) 132/53 (!) 146/57  Pulse:   77 68  Resp:   18 18  Temp:   98 F (36.7 C)   TempSrc:   Oral   SpO2:   100%  100%  Weight:   42.8 kg   Height:   5\' 2"  (1.575 m)     General exam: Elderly female, moderately built, frail and chronically ill looking lying comfortably propped up in bed without distress. Respiratory system: Clear to auscultation. Respiratory effort normal. Cardiovascular system: S1 & S2 heard, RRR. No JVD, murmurs, rubs, gallops or clicks. No pedal edema.  Telemetry personally reviewed: Sinus rhythm. Gastrointestinal system: Abdomen is nondistended, soft and nontender. No organomegaly or masses felt. Normal bowel sounds heard. Central nervous system: Alert and oriented only to self. No focal neurological deficits. Extremities: Grade 5 x 5 power in right limbs, appears to have contractures and limited power in left limbs Skin: No rashes, lesions or ulcers Psychiatry: Judgement and insight impaired. Mood & affect flat and cannot be assessed.     Data Reviewed:   I have personally reviewed following labs and imaging studies   CBC: Recent Labs  Lab 03/04/23 1224 03/05/23 0500  WBC 8.4 7.1  NEUTROABS 4.7  --   HGB 9.3* 8.3*  HCT 31.7* 27.7*  MCV 91.1 89.6  PLT 314 [id number]    Basic Metabolic Panel: Recent Labs  Lab 03/04/23 1224 03/05/23 0500  NA 143 140  K 3.8 3.5  CL 106 103  CO2 29 29  GLUCOSE 132* 139*  BUN 27* 24*  CREATININE 0.63 0.49  CALCIUM 9.3 9.2  MG  --  2.2  PHOS  --  4.0    Liver Function Tests: Recent Labs  Lab 03/04/23 1224 03/05/23 0500  AST 21 17  ALT 15 15  ALKPHOS 49 44  BILITOT 0.2* 0.3  PROT 7.5 6.8  ALBUMIN 3.4* 3.2*    CBG: Recent Labs  Lab 03/05/23 0048 03/05/23 0359 03/05/23 0741  GLUCAP 222* 146* 100*    Microbiology Studies:  No results found for this or any previous visit (from the past 240 hour(s)).  Radiology Studies:  MR BRAIN WO CONTRAST  Result Date: 03/04/2023 CLINICAL DATA:  Failure to thrive, increasing confusion, slurred speech EXAM: MRI HEAD WITHOUT CONTRAST TECHNIQUE: Multiplanar, multiecho pulse sequences  of the brain and surrounding structures were obtained without intravenous contrast. COMPARISON:  MRI head 12/28/2022, correlation is also made with CT head 03/04/2023 FINDINGS: Brain: No restricted diffusion to suggest acute or subacute infarct. No acute hemorrhage, mass, mass effect, or midline shift. No extra-axial collection. Unchanged size and configuration of the ventricular system. A previously noted right frontal approach ventriculostomy catheter is no longer seen. Normal pituitary and craniocervical junction. Advanced cerebral volume loss for age. Confluent T2 hyperintense signal in the periventricular white matter, likely the sequela of severe chronic small vessel ischemic disease. Vascular: Normal arterial flow voids. Skull and upper cervical spine: Normal marrow signal. Sinuses/Orbits: Clear paranasal sinuses. No acute finding in the orbits. Other: Fluid in left mastoid air cells. IMPRESSION: No acute intracranial process. No evidence of acute or subacute infarct. Electronically Signed   By: Merilyn Baba M.D.   On: 03/04/2023 22:34   DG Abdomen 1 View  Result Date: 03/04/2023 CLINICAL DATA:  Constipation EXAM: ABDOMEN - 1 VIEW COMPARISON:  X-ray 02/03/2023 FINDINGS: Large amount of diffuse colonic stool identified. Significant gas and stool in the rectum. There is gas seen in nondilated loops of small and large bowel. No obstruction. No obvious free air on this supine radiograph. The diaphragm is clipped off the edge of the film. Right hip arthroplasty seen at the edge of the imaging field. Previous VP shunt catheter tubing is less appreciated on the current examination. IMPRESSION: Diffuse large amount of colonic stool.  Gas and stool in the rectum. Electronically Signed   By: Jill Side M.D.   On: 03/04/2023 16:00   CT HEAD WO CONTRAST  Result Date: 03/04/2023 CLINICAL DATA:  Altered mental status.  Failure to thrive. EXAM: CT HEAD WITHOUT CONTRAST TECHNIQUE: Contiguous axial images were  obtained from the base of the skull through the vertex without intravenous contrast. RADIATION DOSE REDUCTION: This exam was performed according to the departmental dose-optimization program which includes automated exposure control, adjustment of the mA and/or kV according to patient size and/or use of iterative reconstruction technique. COMPARISON:  CT head 02/17/2023 FINDINGS: Brain: There is no acute intracranial hemorrhage, extra-axial fluid collection, or acute infarct. The previously seen right cerebral convexity subdural collection has essentially resolved with possible minimal residual overlying the anteroinferior frontal lobe (4-21). Background parenchymal volume loss with prominence of the ventricular system and extra-axial CSF spaces is unchanged. The ventricles are stable in size. Extensive hypodensity in the supratentorial white matter consistent with underlying chronic small-vessel ischemic change is stable. A remote infarct in the right thalamus is unchanged. A small focus of encephalomalacia in the right frontal lobe underlying a burr hole is unchanged.  The pituitary and suprasellar region are normal. There is no mass lesion. There is no mass effect or midline shift. Vascular: No hyperdense vessel or unexpected calcification. Skull: Right frontal burr holes are again noted. There is no acute or suspicious osseous abnormality. Sinuses/Orbits: The imaged paranasal sinuses are clear. The globes and orbits are unremarkable. Other: None. IMPRESSION: 1. The previously identified right cerebral convexity subdural collection has essentially resolved. 2. No new acute intracranial pathology. Electronically Signed   By: Valetta Mole M.D.   On: 03/04/2023 15:52   DG Chest Port 1 View  Result Date: 03/04/2023 CLINICAL DATA:  Altered mental status EXAM: PORTABLE CHEST 1 VIEW COMPARISON:  02/03/2023 FINDINGS: Stable cardiomediastinal contours. Aortic atherosclerosis. Surgical clips in the left hilar region. No  focal airspace consolidation, pleural effusion, or pneumothorax. Previously seen shunt catheter is no longer evident. IMPRESSION: No active disease. Electronically Signed   By: Davina Poke D.O.   On: 03/04/2023 13:31    Scheduled Meds:    amLODipine  2.5 mg Oral Daily   aspirin  81 mg Oral Daily   buPROPion  300 mg Oral Q breakfast   ferrous sulfate  325 mg Oral Q breakfast   folic acid  1 mg Oral Daily   insulin aspart  0-5 Units Subcutaneous QHS   insulin aspart  0-9 Units Subcutaneous TID WC   lactobacillus  1 g Oral Daily   melatonin  10 mg Oral QHS   memantine  5 mg Oral BID   multivitamin with minerals  1 tablet Oral Daily   pantoprazole  40 mg Oral Daily   rosuvastatin  20 mg Oral QPM    Continuous Infusions:    lactated ringers 50 mL/hr at 03/05/23 0102     LOS: 1 day     Vernell Leep, MD,  FACP, [facility], [facility], [facility], [facility]   [facility] [place]     To contact the attending provider between 7A-7P or the covering provider during after hours 7P-7A, please log into the web site [email or web address] and access using universal [place] password for that web site. If you do not have the password, please call the hospital operator.  03/05/2023, 9:55 AM

## 2023-03-05 NOTE — TOC Progression Note (Signed)
Transition of Care [facility]) - Progression Note    Patient Details  Name: Amanda Porter MRN: [id number] Date of Birth: 1946-11-18  Transition of [facility]) CM/SW Contact  Henrietta Dine, RN Phone Number: 03/05/2023, 3:32 PM  Clinical Narrative:    [name] w/ pt's dtr [name] in room; she would like bed search limited to [facility]; her preferences are [facility], [place] and [place]; sent out for bed offers; ins auth needed.   Expected Discharge Plan: Skilled Nursing Facility Barriers to Discharge: SNF Pending Medicaid  Expected Discharge Plan and Services                                               Social Determinants of Health (SDOH) Interventions SDOH Screenings   Food Insecurity: No Food Insecurity (12/23/2022)  Housing: Low Risk  (12/23/2022)  Transportation Needs: No Transportation Needs (12/23/2022)  Utilities: Not At Risk (10/20/2022)  Depression (PHQ2-9): High Risk (01/22/2023)  Financial Resource Strain: Low Risk  (10/20/2022)  Stress: No Stress Concern Present (10/20/2022)  Tobacco Use: Medium Risk (03/04/2023)    Readmission Risk Interventions     No data to display

## 2023-03-06 ENCOUNTER — Inpatient Hospital Stay (HOSPITAL_COMMUNITY)

## 2023-03-06 DIAGNOSIS — R627 Adult failure to thrive: Secondary | ICD-10-CM | POA: Diagnosis not present

## 2023-03-06 DIAGNOSIS — F0390 Unspecified dementia without behavioral disturbance: Secondary | ICD-10-CM | POA: Diagnosis not present

## 2023-03-06 DIAGNOSIS — K5900 Constipation, unspecified: Secondary | ICD-10-CM | POA: Diagnosis not present

## 2023-03-06 LAB — BASIC METABOLIC PANEL
Anion gap: 8 (ref 5–15)
BUN: 14 mg/dL (ref 8–23)
CO2: 29 mmol/L (ref 22–32)
Calcium: 8.9 mg/dL (ref 8.9–10.3)
Chloride: 104 mmol/L (ref 98–111)
Creatinine, Ser: 0.52 mg/dL (ref 0.44–1.00)
GFR, Estimated: >60 mL/min (ref >60)
Glucose, Bld: 134 mg/dL — ABNORMAL HIGH (ref 70–99)
Potassium: 3.3 mmol/L — ABNORMAL LOW (ref 3.5–5.1)
Sodium: 141 mmol/L (ref 135–145)

## 2023-03-06 LAB — GLUCOSE, CAPILLARY
Glucose-Capillary: 133 mg/dL — ABNORMAL HIGH (ref 70–99)
Glucose-Capillary: 134 mg/dL — ABNORMAL HIGH (ref 70–99)

## 2023-03-06 MED ORDER — POTASSIUM CHLORIDE 20 MEQ PO PACK
40.0000 meq | PACK | Freq: Once | ORAL | Status: AC
  Administered 2023-03-06: 40 meq via ORAL
  Filled 2023-03-06: qty 2

## 2023-03-06 MED ORDER — SENNOSIDES-DOCUSATE SODIUM 8.6-50 MG PO TABS: 1.0000 | ORAL_TABLET | Freq: Two times a day (BID) | ORAL | 0 refills | Status: DC

## 2023-03-06 MED ORDER — ACETAMINOPHEN 325 MG PO TABS: 650.0000 mg | ORAL_TABLET | Freq: Four times a day (QID) | ORAL | Status: DC | PRN

## 2023-03-06 MED ORDER — ALBUTEROL SULFATE HFA 108 (90 BASE) MCG/ACT IN AERS
INHALATION_SPRAY | RESPIRATORY_TRACT | Status: DC
Start: 2023-03-06 — End: 2023-05-23

## 2023-03-06 MED ORDER — IBUPROFEN 200 MG PO TABS: 400.0000 mg | ORAL_TABLET | Freq: Three times a day (TID) | ORAL | Status: DC | PRN

## 2023-03-06 MED ORDER — POLYETHYLENE GLYCOL 3350 17 G PO PACK: 17.0000 g | PACK | Freq: Two times a day (BID) | ORAL | 0 refills | Status: DC

## 2023-03-06 MED ORDER — BISACODYL 5 MG PO TBEC: 5.0000 mg | DELAYED_RELEASE_TABLET | Freq: Every day | ORAL | 0 refills | Status: DC | PRN

## 2023-03-06 MED ORDER — ALBUTEROL SULFATE 2.5 MG/0.5ML IN NEBU: 2.5000 mg | INHALATION_SOLUTION | Freq: Four times a day (QID) | RESPIRATORY_TRACT | Status: DC | PRN

## 2023-03-06 MED ORDER — POTASSIUM CHLORIDE CRYS ER 20 MEQ PO TBCR: 40.0000 meq | EXTENDED_RELEASE_TABLET | Freq: Once | ORAL | Status: DC

## 2023-03-06 MED ORDER — MILK AND MOLASSES ENEMA
1.0000 | RECTAL | Status: AC
  Administered 2023-03-06: 240 mL via RECTAL
  Filled 2023-03-06: qty 240

## 2023-03-06 MED ORDER — SENNOSIDES-DOCUSATE SODIUM 8.6-50 MG PO TABS: 2.0000 | ORAL_TABLET | Freq: Two times a day (BID) | ORAL | Status: DC

## 2023-03-06 NOTE — Progress Notes (Signed)
Daily Progress Note   Patient Name: Amanda Porter       Date: 03/06/2023 DOB: 09-05-1946  Age: 77 y.o. MRN#: [id number] Attending Physician: Elease Etienne, MD Primary Care Physician: Doreene Nest, NP Admit Date: 03/04/2023  Reason for Consultation/Follow-up: Establishing goals of care  Subjective: Resting in bed, no distress.   Length of Stay: 2  Current Medications: Scheduled Meds:   amLODipine  2.5 mg Oral Daily   aspirin  81 mg Oral Daily   buPROPion  300 mg Oral Q breakfast   ferrous sulfate  325 mg Oral Q breakfast   folic acid  1 mg Oral Daily   insulin aspart  0-5 Units Subcutaneous QHS   insulin aspart  0-9 Units Subcutaneous TID WC   lactobacillus  1 g Oral Daily   melatonin  10 mg Oral QHS   memantine  5 mg Oral BID   multivitamin with minerals  1 tablet Oral Daily   pantoprazole  40 mg Oral Daily   polyethylene glycol  17 g Oral BID   rosuvastatin  20 mg Oral QPM   senna-docusate  1 tablet Oral BID    Continuous Infusions:   PRN Meds: acetaminophen, bisacodyl, prochlorperazine  Physical Exam         Elderly appearing lady resting in bed No distress currently Episodically confused but answers a few questions appropriately Appears frail and chronically ill Does not appear to be in any acute distress Regular work of breathing Abdomen is not distended and   has some contractures Vital Signs: BP (!) 114/56 (BP Location: Right Arm)   Pulse 78   Temp 98 F (36.7 C) (Oral)   Resp 17   Ht 5\' 2"  (1.575 m)   Wt 42.8 kg   SpO2 100%   BMI 17.26 kg/m  SpO2: SpO2: 100 % O2 Device: O2 Device: Nasal Cannula O2 Flow Rate: O2 Flow Rate (L/min): 2 L/min  Intake/output summary:  Intake/Output Summary (Last 24 hours) at 03/06/2023 1209 Last data filed at  03/06/2023 0948 Gross per 24 hour  Intake 235 ml  Output 625 ml  Net -390 ml   LBM: Last BM Date : 03/05/23 Baseline Weight: Weight: 41.7 kg Most recent weight: Weight: 42.8 kg       Palliative Assessment/Data:  Patient Active Problem List   Diagnosis Date Noted   AMS (altered mental status) 03/04/2023   SDH (subdural hematoma) 02/13/2023   Altered mental status 02/05/2023   Subdural hematoma 02/03/2023   Compression of brain 02/03/2023   Complication of ventricular intracranial shunt 02/03/2023   Malnutrition of moderate degree 12/18/2022   COPD (chronic obstructive pulmonary disease) 12/16/2022   Diabetes mellitus without complication 12/16/2022   GERD (gastroesophageal reflux disease) 12/16/2022   Fall at home, initial encounter 12/16/2022   Angiodysplasia of stomach and duodenum 12/16/2022   Bleeding duodenal ulcer 12/16/2022   Status post right hip replacement 10/13/2022   Shunt malfunction    Closed displaced fracture of right femoral neck 09/06/2022   Multiple lung nodules 08/03/2022   Acute urinary retention 08/01/2022   Normocytic anemia 07/29/2022   Recurrent UTI 06/24/2022   Recurrent falls 06/24/2022   DNR (do not resuscitate) 06/24/2022   S/P VP shunt 04/22/2022   Pleural effusion on left 04/08/2022   Decreased urine output 04/04/2022   Vitamin D deficiency 04/04/2022   Other fatigue 04/04/2022   Hypokalemia 04/04/2022   Urinary retention 12/13/2021   Wrist fracture, closed, left, sequela 12/13/2021   Protein-calorie malnutrition, severe 11/07/2021   Tardive dyskinesia 02/01/2020   Cervical spondylolysis 05/24/2019   Chronic respiratory failure with hypoxia 02/25/2019   Postural dizziness 08/11/2018   Decreased hemoglobin 07/02/2018   Anemia 04/12/2018   Type 2 diabetes mellitus with hyperglycemia 02/18/2018   Pain of left lower extremity 11/26/2017   Memory disturbance 11/20/2017   Right carotid bruit 11/20/2017   Hyperlipidemia 09/14/2017    COPD mixed type 09/14/2017   Dementia without behavioral disturbance 09/14/2017   Normal pressure hydrocephalus 09/14/2017   Dysphagia 09/14/2017   Primary hypertension 09/14/2017   History of CVA (cerebrovascular accident) 09/14/2017   Anxiety and depression 09/14/2017   Tobacco use disorder 12/10/2016    Palliative Care Assessment & Plan   Patient Profile:    Assessment:   77 year old lady with past medical history of COPD lung cancer status post lobectomy, former smoker, chronic anemia anxiety depression dementia history of stroke with left-sided weakness diabetes hypertension dyslipidemia tardive dyskinesia recurrent urinary tract infections.  Patient is status post right traumatic head injury in October 2023, status post right VP shunt replacement for NPH, was hospitalized in March for increased confusion and gait disorder where CT head showed a right subdural hematoma and patient underwent right frontal burr hole for drainage of subdural hematoma and VP shunt was removed.  Patient subsequently spent some time at [facility] inpatient rehab from 3-15 through 02-23-2023. Patient was recently assigned to home-based hospice services however for the last 4-5 days, the patient was noted to be more lethargic, more confused, not eating and was brought in by her daughter who works in EMS for additional evaluation.   Patient is admitted to hospital medicine service for acute metabolic encephalopathy in the setting of progressive dementia, adult failure to thrive, constipation, with imaging showing large amount of colonic stool gas.    Recommendations/Plan:  SNF rehab with palliative, as per GOC discussions with HCPOA daughter.  Old MOST form voided in [employer]yn[employer], DNR is current and is uploaded in Epic as well.  Continue current bowel regimen and current mode of care, encourage PO and encourage PT OT.    Code Status:    Code Status Orders  (From admission, onward)           Start     Ordered    03/04/23  2324  Do not attempt resuscitation (DNR)  Continuous       Question Answer Comment  If patient has no pulse and is not breathing Do Not Attempt Resuscitation   If patient has a pulse and/or is breathing: Medical Treatment Goals MEDICAL INTERVENTIONS DESIRED: Use advanced airway interventions, mechanical ventilation or cardioversion in appropriate circumstances; Use medication/IV fluids as indicated; Provide comfort medications; Transfer to Progressive/Stepdown/ICU as indicated.   Consent: Discussion documented in EHR or advanced directives reviewed      03/04/23 2323           Code Status History     Date Active Date Inactive Code Status Order ID Comments User Context   03/04/2023 2312 03/04/2023 2323 DNR [id number]  Terald Sleeperrifan, Matthew J, MD ED   02/13/2023 1511 02/23/2023 1708 DNR [id number]  Charlton AmorAngiulli, Daniel J, PA-C Inpatient   02/13/2023 1511 02/13/2023 1511 Full Code [id number]  Charlton AmorAngiulli, Daniel J, PA-C Inpatient   02/13/2023 1511 02/13/2023 1511 DNR [id number]  Charlton AmorAngiulli, Daniel J, PA-C Inpatient   02/03/2023 2127 02/13/2023 1507 DNR [id number]  Rust-Chester, Cecelia ByarsBritton L, NP ED   02/03/2023 2126 02/03/2023 2127 Full Code [id number]  Rust-Chester, Cecelia ByarsBritton L, NP ED   02/03/2023 2126 02/03/2023 2126 DNR [id number]  Rust-Chester, Cecelia ByarsBritton L, NP ED   02/03/2023 2102 02/03/2023 2125 Full Code [id number]  Rust-Chester, Cecelia ByarsBritton L, NP ED   12/16/2022 1525 12/18/2022 2305 DNR [id number]  Lorretta HarpNiu, Xilin, MD Inpatient   12/16/2022 1414 12/16/2022 1524 Full Code [id number]  Lorretta HarpNiu, Xilin, MD ED   09/19/2022 1303 09/23/2022 1759 Full Code [id number]  Venetia NightYarbrough, Chester, MD Inpatient   09/07/2022 1817 09/12/2022 1941 DNR [id number]  Noralee Stainhoi, Jennifer, DO Inpatient   09/07/2022 1419 09/07/2022 1817 Full Code [id number]  Lyndle HerrlichBowers, James R, MD Inpatient   09/06/2022 1548 09/07/2022 1419 DNR [id number]  Cox, Amy N, DO ED   09/06/2022 1252 09/06/2022 1548 Full Code [id number]  Cox, Amy N, DO ED   07/26/2022 0737 08/02/2022 1821 Full Code [id number]  Charise KillianWilliams,  Jamiese M, MD Inpatient   07/25/2022 [phone] 07/26/2022 0737 DNR [id number]  Hannah BeatMansy, Jan A, MD ED   07/25/2022 0610 07/25/2022 0632 Full Code [id number]  Mansy, Vernetta HoneyJan A, MD ED   04/16/2022 1511 04/23/2022 2209 Full Code [id number]  Tia AlertJones, David S, MD Inpatient   11/05/2021 0039 11/12/2021 1654 Full Code [id number]  Kirtland Bouchardorothy, Christopher R, MD ED       Prognosis:  < 6 months  Discharge Planning: Skilled Nursing Facility for rehab with Palliative care service follow-up  Care plan was discussed with  IDT  Thank you for allowing the Palliative Medicine Team to assist in the care of this patient.  Low MDM.      Greater than 50%  of this time was spent counseling and coordinating care related to the above assessment and plan.  Rosalin HawkingZeba Winfred Redel, MD  Please contact Palliative Medicine Team phone at [phone] for questions and concerns.

## 2023-03-06 NOTE — TOC Transition Note (Signed)
Transition of Care [facility]) - CM/SW Discharge Note   Patient Details  Name: Amanda Porter MRN: [id number] Date of Birth: 1946/10/20  Transition of Care [facility]) CM/SW Contact:  Adrian Prows, RN Phone Number: 03/06/2023, 3:25 PM   Clinical Narrative:    Notified pt ready for d/c; pt will d/c to [address] [place], [place] [address]; called [facility] for transport at 1523; spoke w/ operator # [id number]; no TOC needs.   Final next level of care: Home w Hospice Care Barriers to Discharge: No Barriers Identified   Patient Goals and CMS Choice CMS Medicare.gov Compare Post Acute Care list provided to:: Patient Represenative (must comment) Mahala Menghini (daughter)) Choice offered to / list presented to : Adult Children  Discharge Placement                         Discharge Plan and Services Additional resources added to the After Visit Summary for                                       Social Determinants of Health (SDOH) Interventions SDOH Screenings   Food Insecurity: No Food Insecurity (12/23/2022)  Housing: Low Risk  (12/23/2022)  Transportation Needs: No Transportation Needs (12/23/2022)  Utilities: Not At Risk (10/20/2022)  Depression (PHQ2-9): High Risk (01/22/2023)  Financial Resource Strain: Low Risk  (10/20/2022)  Stress: No Stress Concern Present (10/20/2022)  Tobacco Use: Medium Risk (03/04/2023)     Readmission Risk Interventions     No data to display

## 2023-03-06 NOTE — TOC Progression Note (Addendum)
Transition of Care [facility]) - Progression Note    Patient Details  Name: Amanda Porter MRN: [id number] Date of Birth: [birth year]  Transition of Care [facility]) CM/SW Contact  Adrian Prows, RN Phone Number: 03/06/2023, 10:20 AM  Clinical Narrative:    Notified pt's dtr Morrie Sheldon called for update on status of bed offers; notified her only offer is from [place] note saying bed could be offered if 30 day payment received in advance; she says they will wait for other bed offers; awaiting other bed offers.  -1030- Albin Felling, LCSW, for home hospice at bedside; she was given update pt awaiting bed offers.   Expected Discharge Plan: Skilled Nursing Facility Barriers to Discharge: SNF Pending Medicaid  Expected Discharge Plan and Services                                               Social Determinants of Health (SDOH) Interventions SDOH Screenings   Food Insecurity: No Food Insecurity (12/23/2022)  Housing: Low Risk  (12/23/2022)  Transportation Needs: No Transportation Needs (12/23/2022)  Utilities: Not At Risk (10/20/2022)  Depression (PHQ2-9): High Risk (01/22/2023)  Financial Resource Strain: Low Risk  (10/20/2022)  Stress: No Stress Concern Present (10/20/2022)  Tobacco Use: Medium Risk (03/04/2023)    Readmission Risk Interventions     No data to display

## 2023-03-06 NOTE — Progress Notes (Addendum)
PROGRESS NOTE   Amanda Porter  ZOX:[id number]    DOB: 1946/03/02    DOA: 03/04/2023  PCP: Doreene Nest, NP   I have briefly reviewed patients previous medical records in [facility].  Chief Complaint  Patient presents with   Failure To Thrive   Fatigue    Brief Narrative:  77 year old female, medical history significant for COPD, lung cancer s/p left upper lobe lobectomy, former smoker, chronic anemia, anxiety with depression, dementia, CVA with left hemiparesis, type II DM, HLD, HTN, tardive dyskinesia, recurrent UTIs, right THA 09/07/2022, right VP shunt replacement for NPH, admitted to [facility] 02/03/2023 with increased confusion and gait disorder, CT head showed right subdural hematoma significantly increased in size since 01/12/2023 with rightward midline shift.  She underwent right frontal burr hole for drainage of subdural hematoma on 02/03/2023 by Dr. Marcell Barlow and VP shunt was removed.  She was discharged and admitted to rehab 02/13/2023 - 02/23/2023.  Per admitting MD, at home on hospice care, presented to the ED on 03/04/2023 with progressive confusion/altered mental status.  MRI brain without acute findings.  Admitted for acute metabolic encephalopathy versus suspected progression of dementia as cause of her worsening confusion and failure to thrive.  Palliative care medicine consulted. Medically optimized for DC to SNF pending bed.   Assessment & Plan:  Principal Problem:   AMS (altered mental status)   Acute metabolic encephalopathy versus progressive dementia, as cause of her progressive worsening mental status changes: MRI brain without acute findings.  UA not suggestive of UTI.  No mention of discharging on home hospice in inpatient rehab discharge notes.  Not sure if this was arranged sometime after.  This will need to be verified with the [facility] team.  Agree with palliative care consultation to further clarify goals of care.  Currently only alert and oriented x 1.  Overall poor  prognosis.  Delirium precautions.  Hypokalemia: Replace and follow.  Recent magnesium 2.2.  Adult failure to thrive: Multifactorial due to advanced age, progressive dementia, multiple significant comorbidities.  Palliative care consultation to address goals of cares.  PT and OT evaluated and recommend SNF for short-term rehab which is what the daughter would like as well.  Constipation: Diffuse large amount of colonic stool, gas and stool in the rectum on KUB.  S/p glycerin suppository and mineral oil enema on admission.  Aggressive bowel regimen.  Unclear if she has had a BM thus far.  This may well be contributing to her confusion as well.  No BM has been clear early documented.  Check KUB and if large stool burden, plan for repeat enema.  Normocytic anemia/iron deficiency anemia: Current hemoglobin appears to be at her previous baseline.  Dropped from 9.3 on admission to 8.32 days likely dilutional.  Continue ferrous sulfate supplements.  Essential hypertension: Continue amlodipine 2.5 Mg daily.  Controlled.  Hyperlipidemia: Continue rosuvastatin 20 mg every afternoon.  Advanced dementia: Continue home dose of Namenda.  Type II DM with hyperglycemia: A1c 6.8 on 01/22/2023.  Holding home hypoglycemics.  Continue SSI.  Reasonable inpatient control.  GERD Continue PPI.  Recent subdural hematoma s/p emergent right frontal burr hole for evacuation and VP shunt removal 02/03/2023.  Adult failure to thrive: Multifactorial due to very advanced age, multiple severe comorbidities, frail physical health and deconditioning.  As per my discussion with patient's daughter, prior to her first hospitalization in early February, she was alert and oriented x 4.  However since February, she appears to have achieved "a  new baseline" mental status with ongoing confusion.  For the first 4 days after return from CIR, she reportedly was doing better, able to sit up by herself, ambulate with the help of a  walker, completed a course of Keflex for UTI.  However thereafter, she became progressively more confused, lethargic, had fever up to 102 F, intermittently agitated, poor oral intake, weak and daughter was unable to manage her at home.  Hospice has been only involved for the last week.  Daughter indicated that they were not sure if she was at end-of-life versus some acute medical condition going on, reportedly did not qualify for residential hospice, eventually brought to the hospital for medical evaluation.  Daughter has a 78-year-old autistic child.  She currently states that she is not able to manage the patient's current high needs.  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  However, she wants to discuss further with case management to see what her options are.  ?  Chronic respiratory failure with hypoxia: As per family report, patient is on 2 L/min home oxygen at home and they wish to continue despite oxygen saturation this morning 94% on room air.  Body mass index is 17.26 kg/m.  DVT prophylaxis:   Added subcutaneous Lovenox   Code Status: DNR:  ACP Documents: Reviewed MOST form, states to attempt CPR/antibiotics and IV fluids if indicated/feeding tube and full scope of treatment but currently DNR which is probably most appropriate.  Will await palliative care team input.  Has a legal guardian and healthcare power of attorney.  As per admitting MD, patient had a DNR form in the room. Family Communication: I discussed with daughter yesterday.  None at bedside today. Disposition:  Inpatient.  Patient is medically optimized for DC to SNF pending bed.  TOC has already been consulted.     Consultants:   Palliative care medicine  Procedures:     Antimicrobials:      Subjective:  Poor historian due to chronic AMS.   Alert and oriented only to self.  Denies complaints.  Objective:   Vitals:   03/05/23 1000 03/05/23 1205 03/05/23 2045 03/06/23 0645  BP:  133/61 (!) 141/71 (!) 114/56  Pulse:  78 77 78  Resp:  18 16 17   Temp: (!) 97.5 F (36.4 C) (!) 97.5 F (36.4 C) 98.4 F (36.9 C) 98 F (36.7 C)  TempSrc: Oral Oral Oral Oral  SpO2:  100% 100% 100%  Weight:      Height:        General exam: Elderly female, moderately built, frail and chronically ill looking lying comfortably propped up in bed without distress.  Oral mucosa moist. Respiratory system: Clear to quotation.  No increased work of breathing. Cardiovascular system: S1 & S2 heard, RRR. No JVD, murmurs, rubs, gallops or clicks. No pedal edema.  Telemetry discontinued 4/4. Gastrointestinal system: Abdomen is nondistended, soft and nontender. No organomegaly or masses felt. Normal bowel sounds heard. Central nervous system: Alert and oriented only to self. No focal neurological deficits.  No change. Extremities: Grade 5 x 5 power in right limbs, appears to have contractures and limited power in left limbs Skin: No rashes, lesions or ulcers Psychiatry: Judgement and insight impaired. Mood & affect flat mostly flat.     Data Reviewed:   I have personally reviewed following labs and imaging studies  CBC: Recent Labs  Lab 03/04/23 1224 03/05/23 0500  WBC 8.4 7.1  NEUTROABS 4.7  --   HGB 9.3* 8.3*  HCT 31.7* 27.7*  MCV 91.1 89.6  PLT 314 295    Basic Metabolic Panel: Recent Labs  Lab 03/04/23 1224 03/05/23 0500 03/06/23 0514  NA 143 140 141  K 3.8 3.5 3.3*  CL 106 103 104  CO2 29 29 29   GLUCOSE 132* 139* 134*  BUN 27* 24* 14  CREATININE 0.63 0.49 0.52  CALCIUM 9.3 9.2 8.9  MG  --  2.2  --   PHOS  --  4.0  --     Liver Function Tests: Recent Labs  Lab 03/04/23 1224 03/05/23 0500  AST 21 17  ALT 15 15  ALKPHOS 49 44  BILITOT 0.2* 0.3  PROT 7.5 6.8  ALBUMIN 3.4* 3.2*    CBG: Recent Labs  Lab  03/05/23 1644 03/05/23 2135 03/06/23 0759  GLUCAP 100* 156* 133*    Microbiology Studies:  No results found for this or any previous visit (from the past 240 hour(s)).  Radiology Studies:  MR BRAIN WO CONTRAST  Result Date: 03/04/2023 CLINICAL DATA:  Failure to thrive, increasing confusion, slurred speech EXAM: MRI HEAD WITHOUT CONTRAST TECHNIQUE: Multiplanar, multiecho pulse sequences of the brain and surrounding structures were obtained without intravenous contrast. COMPARISON:  MRI head 12/28/2022, correlation is also made with CT head 03/04/2023 FINDINGS: Brain: No restricted diffusion to suggest acute or subacute infarct. No acute hemorrhage, mass, mass effect, or midline shift. No extra-axial collection. Unchanged size and configuration of the ventricular system. A previously noted right frontal approach ventriculostomy catheter is no longer seen. Normal pituitary and craniocervical junction. Advanced cerebral volume loss for age. Confluent T2 hyperintense signal in the periventricular white matter, likely the sequela of severe chronic small vessel ischemic disease. Vascular: Normal arterial flow voids. Skull and upper cervical spine: Normal marrow signal. Sinuses/Orbits: Clear paranasal sinuses. No acute finding in the orbits. Other: Fluid in left mastoid air cells. IMPRESSION: No acute intracranial process. No evidence of acute or subacute infarct. Electronically Signed   By: Wiliam Ke M.D.   On: 03/04/2023 22:34   DG Abdomen 1 View  Result Date: 03/04/2023 CLINICAL DATA:  Constipation EXAM: ABDOMEN - 1 VIEW COMPARISON:  X-ray 02/03/2023 FINDINGS: Large amount of diffuse colonic stool identified. Significant gas and stool in the rectum. There is gas seen in nondilated loops of small and large bowel. No obstruction. No obvious free air on this supine radiograph. The diaphragm is clipped off the edge of the film. Right hip arthroplasty seen at the edge of the imaging field. Previous VP shunt  catheter tubing is less appreciated on the current examination. IMPRESSION: Diffuse large amount of colonic stool.  Gas and stool in the rectum. Electronically Signed   By: Karen Kays M.D.   On: 03/04/2023 16:00   CT HEAD WO CONTRAST  Result Date: 03/04/2023 CLINICAL DATA:  Altered mental status.  Failure to thrive. EXAM: CT HEAD WITHOUT CONTRAST TECHNIQUE: Contiguous axial images were obtained from the base of the skull through the vertex without intravenous contrast. RADIATION DOSE REDUCTION: This exam was performed according to the departmental dose-optimization program which includes automated exposure control, adjustment of the mA and/or kV according to patient size and/or use of iterative reconstruction technique. COMPARISON:  CT head 02/17/2023 FINDINGS: Brain: There is no acute intracranial hemorrhage, extra-axial fluid collection, or acute infarct. The previously seen right cerebral convexity subdural collection has essentially  resolved with possible minimal residual overlying the anteroinferior frontal lobe (4-21). Background parenchymal volume loss with prominence of the ventricular system and extra-axial CSF spaces is unchanged. The ventricles are stable in size. Extensive hypodensity in the supratentorial white matter consistent with underlying chronic small-vessel ischemic change is stable. A remote infarct in the right thalamus is unchanged. A small focus of encephalomalacia in the right frontal lobe underlying a burr hole is unchanged. The pituitary and suprasellar region are normal. There is no mass lesion. There is no mass effect or midline shift. Vascular: No hyperdense vessel or unexpected calcification. Skull: Right frontal burr holes are again noted. There is no acute or suspicious osseous abnormality. Sinuses/Orbits: The imaged paranasal sinuses are clear. The globes and orbits are unremarkable. Other: None. IMPRESSION: 1. The previously identified right cerebral convexity subdural  collection has essentially resolved. 2. No new acute intracranial pathology. Electronically Signed   By: Lesia Hausen M.D.   On: 03/04/2023 15:52   DG Chest Port 1 View  Result Date: 03/04/2023 CLINICAL DATA:  Altered mental status EXAM: PORTABLE CHEST 1 VIEW COMPARISON:  02/03/2023 FINDINGS: Stable cardiomediastinal contours. Aortic atherosclerosis. Surgical clips in the left hilar region. No focal airspace consolidation, pleural effusion, or pneumothorax. Previously seen shunt catheter is no longer evident. IMPRESSION: No active disease. Electronically Signed   By: Duanne Guess D.O.   On: 03/04/2023 13:31    Scheduled Meds:    amLODipine  2.5 mg Oral Daily   aspirin  81 mg Oral Daily   buPROPion  300 mg Oral Q breakfast   ferrous sulfate  325 mg Oral Q breakfast   folic acid  1 mg Oral Daily   insulin aspart  0-5 Units Subcutaneous QHS   insulin aspart  0-9 Units Subcutaneous TID WC   lactobacillus  1 g Oral Daily   melatonin  10 mg Oral QHS   memantine  5 mg Oral BID   multivitamin with minerals  1 tablet Oral Daily   pantoprazole  40 mg Oral Daily   polyethylene glycol  17 g Oral BID   rosuvastatin  20 mg Oral QPM   senna-docusate  1 tablet Oral BID    Continuous Infusions:       LOS: 2 days     Marcellus Scott, MD,  FACP, FHM, SFHM, [facility], [facility]   [facility] [place]     To contact the attending provider between 7A-7P or the covering provider during after hours 7P-7A, please log into the web site [email or web address] and access using universal [place] password for that web site. If you do not have the password, please call the hospital operator.  03/06/2023, 9:43 AM

## 2023-03-06 NOTE — Progress Notes (Addendum)
[facility] [employer], [employer] [facility]) Hospital Liaison Note  Ms. Jacquelynn Yusupov is a hospitalized [facility] patient with a terminal hospice diagnosis of Obstructive hydrocephalus and was admitted on our services on 02/24/23.  On 03/04/23, the patient's Family elected to call EMS during a visit with hospice RN to have symptoms of altered mental status changes evaluated in the [facility] ED.  Patient is admitted with AMS which is a related admission per Dr. Anne Fu, an [facility] physician.   Visited patient in room. No visitors present. Ms. Motherway reports that she is not having any pain and she does not have any complaints.  Called daughter Morrie Sheldon on the phone and she shares she has not had any acceptable bed offers at this time and she would like to bring patient home with the continued support of hospice services. Ashley requests 3 in 1 and OBT to be delivered to the home.  Shared this information with MD and with TOC. Dr Waymon Amato requests patient receive her enema, follow up with him and then patient can be discharged.   Please use [facility] for transport as [facility] contracts with them for our active hospice patients.  Please call with any hospice related questions.  Thank you, Haynes Bast, BSN, RN [facility] Liaison [phone]

## 2023-03-06 NOTE — TOC Progression Note (Signed)
Transition of Care [facility]) - Progression Note    Patient Details  Name: Amanda Porter MRN: [id number] Date of Birth: 1946/08/14  Transition [facility] Care [facility]) CM/SW Contact  Adrian Prows, RN Phone Number: 03/06/2023, 12:44 PM  Clinical Narrative:    Notified by Janan Ridge pt's dtr has decided to take pt home w/ hospice; she says she notified pt's dtr [name] that the hospital is ready for discharge and she is willing to accept her today; TOC was asked to use [facility] for transport as we contract with them for our active hospice patients; contacted pt's dtr and she confirms; she says pt will d/c to [address] [place], [place] [address]; Morrie Sheldon also requests pt's dentures go w/ pt; Adrianne, RN notified; Adrianne also says enema ordered for pt and MD would like to know result before pt is discharged; will call [facility] when pt ready for d/c; no TOC needs.  Expected Discharge Plan: Skilled Nursing Facility Barriers to Discharge: SNF Pending Medicaid  Expected Discharge Plan and Services                                               Social Determinants of Health (SDOH) Interventions SDOH Screenings   Food Insecurity: No Food Insecurity (12/23/2022)  Housing: Low Risk  (12/23/2022)  Transportation Needs: No Transportation Needs (12/23/2022)  Utilities: Not At Risk (10/20/2022)  Depression (PHQ2-9): High Risk (01/22/2023)  Financial Resource Strain: Low Risk  (10/20/2022)  Stress: No Stress Concern Present (10/20/2022)  Tobacco Use: Medium Risk (03/04/2023)    Readmission Risk Interventions     No data to display

## 2023-03-06 NOTE — Discharge Summary (Signed)
Physician Discharge Summary  Amanda Porter OMV:[id number] DOB: 15-Jun-1946  PCP: Doreene Nest, NP  Admitted from: Home Discharged to: Returning home with hospice.  Admit date: 03/04/2023 Discharge date: 03/06/2023  Recommendations for Outpatient Follow-up:    Follow-up Information     Doreene Nest, NP. Schedule an appointment as soon as possible for a visit.   Specialty: Internal Medicine Contact information: [address] [place] [address] [phone]         [facility] Follow up.   Why: Home hospice will continue to follow for assistance.                 Home Health: None    Equipment/Devices: Hospice will assist with any needed equipment.    Discharge Condition: Guarded and at risk for progressive decline and even death.   Code Status: DNR.  Reviewed ACP documents again at time of discharge.  Now has updated DNR form. Diet recommendation:  Discharge Diet Orders (From admission, onward)     Start     Ordered   03/06/23 0000  Diet - low sodium heart healthy        03/06/23 1438             Discharge Diagnoses:  Principal Problem:   AMS (altered mental status) Active Problems:   Failure to thrive in adult   Brief Summary: 77 year old female, medical history significant for COPD, lung cancer s/p left upper lobe lobectomy, former smoker, chronic anemia, anxiety with depression, dementia, CVA with left hemiparesis, type II DM, HLD, HTN, tardive dyskinesia, recurrent UTIs, right THA 09/07/2022, right VP shunt replacement for NPH, admitted to [facility] 02/03/2023 with increased confusion and gait disorder, CT head showed right subdural hematoma significantly increased in size since 01/12/2023 with rightward midline shift.  She underwent right frontal burr hole for drainage of subdural hematoma on 02/03/2023 by Dr. Marcell Barlow and VP shunt was removed.  She was discharged and admitted to rehab 02/13/2023 - 02/23/2023.  Per admitting MD, at home  on hospice care, presented to the ED on 03/04/2023 with progressive confusion/altered mental status.  MRI brain without acute findings.  Admitted for acute metabolic encephalopathy versus suspected progression of dementia as cause of her worsening confusion and failure to thrive.  Palliative care medicine consulted. Medically optimized for DC to SNF pending bed.  Unfortunately no SNF bed offers and daughter has decided to return patient home with home hospice services.     Assessment & Plan:   Acute metabolic encephalopathy versus progressive dementia, as cause of her progressive worsening mental status changes: MRI brain without acute findings.  UA not suggestive of UTI.  Recently enrolled in home hospice may be a week prior to this admission.  Palliative care consulted.  She does not have any pain or discomfort issues.  No agitation in the hospital either.  Although SNF for short-term rehab was desired based on therapies evaluation, unable to find any SNF bed offers and daughter has finally decided to take patient back home with home hospice.  Palliative care consultation appreciated. Currently only alert and oriented x 1.  Overall poor prognosis.  Delirium precautions.   Hypokalemia: Replaced this morning.  Recent magnesium 2.2.   Adult failure to thrive: Multifactorial due to advanced age, progressive dementia, multiple significant comorbidities.  Palliative care consultation to address goals of cares, and their input appreciated.     Constipation: Diffuse large amount of colonic stool, gas and stool in the rectum on  KUB.  S/p glycerin suppository and mineral oil enema on admission.  Aggressive bowel regimen.  This may well be contributing to her confusion as well.  No BM has been clear early documented.  Repeat KUB today showed significant stool burden.  Given a dose of milk and molasses enema patient had 2 large solid lumps of stools.  Continue aggressive bowel regimen at discharge.   Normocytic  anemia/iron deficiency anemia: Current hemoglobin appears to be at her previous baseline.  Dropped from 9.3 on admission to 8.32 days likely dilutional.  Continue ferrous sulfate supplements.   Essential hypertension: Continue amlodipine 2.5 Mg daily.  Controlled.   Hyperlipidemia: Continue rosuvastatin 20 mg every afternoon.   Advanced dementia: Continue home dose of Namenda.   Type II DM with hyperglycemia: A1c 6.8 on 01/22/2023.  Continue prior oral hypoglycemics.   GERD Continue PPI.   Recent subdural hematoma s/p emergent right frontal burr hole for evacuation and VP shunt removal 02/03/2023.  History of hydrocephalus.  This was her terminal admitting diagnosis for home hospice   Adult failure to thrive: Multifactorial due to very advanced age, multiple severe comorbidities, frail physical health and deconditioning.  As per my discussion with patient's daughter, prior to her first hospitalization in early February, she was alert and oriented x 4.  However since February, she appears to have achieved "a new baseline" mental status with ongoing confusion.  For the first 4 days after return from CIR, she reportedly was doing better, able to sit up by herself, ambulate with the help of a walker, completed a course of Keflex for UTI.  However thereafter, she became progressively more confused, lethargic, had fever up to 102 F, intermittently agitated, poor oral intake, weak and daughter was unable to manage her at home.  Hospice has been only involved for the last week.  Daughter indicated that they were not sure if she was at end-of-life versus some acute medical condition going on, reportedly did not qualify for residential hospice, eventually brought to the hospital for medical evaluation.  Daughter has a 78-year-old autistic child.  She currently states that she is not able to manage the patient's current high needs.  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  However, she discussed further with case management to see what her options are.  Kindly see disposition as noted above.   ?  Chronic respiratory failure with hypoxia: As per family report, patient is on 2 L/min home oxygen at home and they wish to continue despite oxygen saturation this morning 94% on room air.   Body mass index is 17.26 kg/m.     Consultants:   Palliative care medicine   Procedures:       Discharge Instructions  Discharge Instructions     Call MD for:   Complete by: As directed    Recurrent confusion or worsening mental status changes.   Call MD for:  difficulty breathing, headache or visual disturbances   Complete by: As directed    Call MD for:  extreme fatigue   Complete by: As directed    Call MD for:  persistant dizziness or light-headedness   Complete by: As directed    Call MD for:  persistant nausea and vomiting   Complete by: As directed    Call MD for:  severe uncontrolled pain   Complete by:  As directed    Call MD for:  temperature >100.4   Complete by: As directed    Diet - low sodium heart healthy   Complete by: As directed    Discharge instructions   Complete by: As directed    Acetaminophen dose from all sources not to exceed 4 g/day.   Increase activity slowly   Complete by: As directed    No wound care   Complete by: As directed         Medication List     STOP taking these medications    cephALEXin 500 MG capsule Commonly known as: KEFLEX   lactobacillus Pack   Melatonin 10 MG Tabs   multivitamin with minerals Tabs tablet   Prolia 60 MG/ML Sosy injection Generic drug: denosumab       TAKE these medications    acetaminophen 325 MG tablet Commonly known as: TYLENOL Take 2 tablets (650 mg total) by mouth every 6 (six) hours as needed for mild pain. What changed:  medication  strength how much to take reasons to take this   Albuterol Sulfate 2.5 MG/0.5ML Nebu Inhale 0.5 mLs (2.5 mg total) into the lungs every 6 (six) hours as needed (wheezing or shortness of breath.). What changed:  when to take this reasons to take this   albuterol 108 (90 Base) MCG/ACT inhaler Commonly known as: VENTOLIN HFA INHALE 2 PUFFS BY MOUTH EVERY 4 HOURS AS NEEDED for wheezing or dyspnea. What changed: additional instructions   amLODipine 2.5 MG tablet Commonly known as: NORVASC Take 1 tablet (2.5 mg total) by mouth daily. for blood pressure.   ascorbic acid 500 MG tablet Commonly known as: VITAMIN C Take 1 tablet (500 mg total) by mouth daily.   aspirin 81 MG chewable tablet Chew 1 tablet (81 mg total) by mouth daily. Hold for now and resume on 12/22/22   Baclofen 5 MG Tabs Take 1 tablet (5 mg total) by mouth daily as needed for muscle spasms. What changed: when to take this   bisacodyl 5 MG EC tablet Commonly known as: DULCOLAX Take 1 tablet (5 mg total) by mouth daily as needed for moderate constipation.   buPROPion 300 MG 24 hr tablet Commonly known as: WELLBUTRIN XL Take 1 tablet (300 mg total) by mouth daily with breakfast. for anxiety and depression.   busPIRone 30 MG tablet Commonly known as: BUSPAR Take 1 tablet (30 mg total) by mouth 2 (two) times daily.   butalbital-acetaminophen-caffeine 50-325-40 MG tablet Commonly known as: FIORICET Take 1 tablet by mouth every 8 (eight) hours as needed for headache. What changed: when to take this   clonazePAM 1 MG tablet Commonly known as: KLONOPIN Take 1 mg by mouth at bedtime.   ferrous sulfate 325 (65 FE) MG tablet Take 1 tablet (325 mg total) by mouth daily with breakfast. What changed: when to take this   folic acid 1 MG tablet Commonly known as: FOLVITE Take 1 tablet (1 mg total) by mouth daily.   glipiZIDE 2.5 MG 24 hr tablet Commonly known as: GLUCOTROL XL Take 1 tablet (2.5 mg total) by mouth  daily with breakfast. For diabetes.   ibuprofen 200 MG tablet Commonly known as: ADVIL Take 2 tablets (400 mg total) by mouth every 8 (eight) hours as needed for moderate pain. What changed: when to take this   memantine 5 MG tablet Commonly known as: NAMENDA Take 1 tablet (5 mg total) by mouth 2 (two) times daily. For memory.  pantoprazole 40 MG tablet Commonly known as: PROTONIX Take 1 tablet (40 mg total) by mouth daily.   polyethylene glycol 17 g packet Commonly known as: MIRALAX / GLYCOLAX Take 17 g by mouth 2 (two) times daily.   Premarin vaginal cream Generic drug: conjugated estrogens Apply one pea-sized amount around the opening of the urethra daily for 2 weeks, then 3 times weekly moving forward. What changed:  how much to take how to take this when to take this additional instructions   rosuvastatin 20 MG tablet Commonly known as: CRESTOR Take 1 tablet (20 mg total) by mouth every evening. For cholesterol.   senna-docusate 8.6-50 MG tablet Commonly known as: Senokot-S Take 1 tablet by mouth 2 (two) times daily.   Stiolto Respimat 2.5-2.5 MCG/ACT Aers Generic drug: Tiotropium Bromide-Olodaterol Inhale 2 each into the lungs in the morning.       Allergies  Allergen Reactions   Neosporin [Bacitracin-Polymyxin B] Rash    Really bad rash   Lexapro [Escitalopram Oxalate] Other (See Comments)    Dizziness, nausea, fatigue      Procedures/Studies: DG Abd Portable 1V  Result Date: 03/06/2023 CLINICAL DATA:  Constipation. EXAM: PORTABLE ABDOMEN - 1 VIEW COMPARISON:  03/04/2023 FINDINGS: Moderate stool seen throughout the colon, mildly decreased since previous study. No evidence of dilated bowel loops. No radiopaque calculi identified. IMPRESSION: Mildly decreased colonic stool burden since prior study. Electronically Signed   By: Danae Orleans M.D.   On: 03/06/2023 10:14   MR BRAIN WO CONTRAST  Result Date: 03/04/2023 CLINICAL DATA:  Failure to thrive,  increasing confusion, slurred speech EXAM: MRI HEAD WITHOUT CONTRAST TECHNIQUE: Multiplanar, multiecho pulse sequences of the brain and surrounding structures were obtained without intravenous contrast. COMPARISON:  MRI head 12/28/2022, correlation is also made with CT head 03/04/2023 FINDINGS: Brain: No restricted diffusion to suggest acute or subacute infarct. No acute hemorrhage, mass, mass effect, or midline shift. No extra-axial collection. Unchanged size and configuration of the ventricular system. A previously noted right frontal approach ventriculostomy catheter is no longer seen. Normal pituitary and craniocervical junction. Advanced cerebral volume loss for age. Confluent T2 hyperintense signal in the periventricular white matter, likely the sequela of severe chronic small vessel ischemic disease. Vascular: Normal arterial flow voids. Skull and upper cervical spine: Normal marrow signal. Sinuses/Orbits: Clear paranasal sinuses. No acute finding in the orbits. Other: Fluid in left mastoid air cells. IMPRESSION: No acute intracranial process. No evidence of acute or subacute infarct. Electronically Signed   By: Wiliam Ke M.D.   On: 03/04/2023 22:34   DG Abdomen 1 View  Result Date: 03/04/2023 CLINICAL DATA:  Constipation EXAM: ABDOMEN - 1 VIEW COMPARISON:  X-ray 02/03/2023 FINDINGS: Large amount of diffuse colonic stool identified. Significant gas and stool in the rectum. There is gas seen in nondilated loops of small and large bowel. No obstruction. No obvious free air on this supine radiograph. The diaphragm is clipped off the edge of the film. Right hip arthroplasty seen at the edge of the imaging field. Previous VP shunt catheter tubing is less appreciated on the current examination. IMPRESSION: Diffuse large amount of colonic stool.  Gas and stool in the rectum. Electronically Signed   By: Karen Kays M.D.   On: 03/04/2023 16:00   CT HEAD WO CONTRAST  Result Date: 03/04/2023 CLINICAL DATA:   Altered mental status.  Failure to thrive. EXAM: CT HEAD WITHOUT CONTRAST TECHNIQUE: Contiguous axial images were obtained from the base of the skull through the vertex  without intravenous contrast. RADIATION DOSE REDUCTION: This exam was performed according to the departmental dose-optimization program which includes automated exposure control, adjustment of the mA and/or kV according to patient size and/or use of iterative reconstruction technique. COMPARISON:  CT head 02/17/2023 FINDINGS: Brain: There is no acute intracranial hemorrhage, extra-axial fluid collection, or acute infarct. The previously seen right cerebral convexity subdural collection has essentially resolved with possible minimal residual overlying the anteroinferior frontal lobe (4-21). Background parenchymal volume loss with prominence of the ventricular system and extra-axial CSF spaces is unchanged. The ventricles are stable in size. Extensive hypodensity in the supratentorial white matter consistent with underlying chronic small-vessel ischemic change is stable. A remote infarct in the right thalamus is unchanged. A small focus of encephalomalacia in the right frontal lobe underlying a burr hole is unchanged. The pituitary and suprasellar region are normal. There is no mass lesion. There is no mass effect or midline shift. Vascular: No hyperdense vessel or unexpected calcification. Skull: Right frontal burr holes are again noted. There is no acute or suspicious osseous abnormality. Sinuses/Orbits: The imaged paranasal sinuses are clear. The globes and orbits are unremarkable. Other: None. IMPRESSION: 1. The previously identified right cerebral convexity subdural collection has essentially resolved. 2. No new acute intracranial pathology. Electronically Signed   By: Lesia Hausen M.D.   On: 03/04/2023 15:52   DG Chest Port 1 View  Result Date: 03/04/2023 CLINICAL DATA:  Altered mental status EXAM: PORTABLE CHEST 1 VIEW COMPARISON:  02/03/2023  FINDINGS: Stable cardiomediastinal contours. Aortic atherosclerosis. Surgical clips in the left hilar region. No focal airspace consolidation, pleural effusion, or pneumothorax. Previously seen shunt catheter is no longer evident. IMPRESSION: No active disease. Electronically Signed   By: Duanne Guess D.O.   On: 03/04/2023 13:31   CT HEAD WO CONTRAST ( )  Result Date: 02/17/2023 CLINICAL DATA:  Follow-up examination for subdural hematoma. EXAM: CT HEAD WITHOUT CONTRAST TECHNIQUE: Contiguous axial images were obtained from the base of the skull through the vertex without intravenous contrast. RADIATION DOSE REDUCTION: This exam was performed according to the departmental dose-optimization program which includes automated exposure control, adjustment of the mA and/or kV according to patient size and/or use of iterative reconstruction technique. COMPARISON:  Prior study from 02/09/2023. FINDINGS: Brain: Previously identified right-sided subdural collection has decreased in size from previous, now measuring no more than 4 mm in maximal thickness. No hyperdense small amount of residual hyperdense blood products, similar to prior. No significant mass effect or midline shift. No other new acute intracranial hemorrhage. No acute large vessel territory infarct. Age-related cerebral atrophy with advanced chronic microvascular ischemic disease. Associated chronic lacunar infarcts noted about the thalami. Ventriculomegaly, stable from prior. Vascular: No abnormal hyperdense vessel. Scattered vascular calcifications noted within the carotid siphons. Skull: Prior right frontal burr hole craniotomy. Calvarium otherwise intact. Scalp soft tissues demonstrate no acute finding. Sinuses/Orbits: Right gaze preference noted. Paranasal sinuses are clear. Trace left mastoid effusion noted, of doubtful significance. Other: None. IMPRESSION: 1. Interval decrease in size of right-sided subdural collection, now measuring no more  than 4 mm in maximal thickness. No significant mass effect or midline shift. 2. No other acute intracranial abnormality. 3. Ventriculomegaly, stable from prior. 4. Underlying atrophy with chronic microvascular ischemic disease. Electronically Signed   By: Rise Mu M.D.   On: 02/17/2023 20:27   CT HEAD WO CONTRAST ( )  Result Date: 02/09/2023 CLINICAL DATA:  Altered mental status, status post right subdural hematoma drainage EXAM: CT HEAD WITHOUT CONTRAST TECHNIQUE:  Contiguous axial images were obtained from the base of the skull through the vertex without intravenous contrast. RADIATION DOSE REDUCTION: This exam was performed according to the departmental dose-optimization program which includes automated exposure control, adjustment of the mA and/or kV according to patient size and/or use of iterative reconstruction technique. COMPARISON:  02/04/2023 FINDINGS: Brain: Interval removal of a previously noted right-sided drain. The right subdural collection now measures up to 6 mm (series 3, image 14), compared to a 2.0 cm width collection that was primarily air on prior exam. A small amount of air remains within the collection. Approximately 4 mm of right-to-left midline shift, previously 7 mm. No acute infarct, hemorrhage, or mass. Ventricular size and configuration is similar to a study from 01/07/2023. The previously noted ventriculostomy catheter is not present. No new extra-axial collection. Remote infarct in the right thalamus. Periventricular white matter changes, likely the sequela of chronic small vessel ischemic disease. Vascular: No hyperdense vessel. Skull: Right frontal and anterior parietal burr holes. Superficial skin staples. No acute fracture or suspicious osseous lesion. Sinuses/Orbits: Clear paranasal sinuses. No acute finding in the orbits. Other: The mastoids are well aerated. IMPRESSION: 1. Interval removal of the previously noted right-sided drain, with decreased size of the  right subdural collection, now 6 mm, and decreased right to left midline shift, now 4 mm. 2. Ventricular size and configuration is similar to a study from 01/07/2023. Electronically Signed   By: Wiliam Ke M.D.   On: 02/09/2023 21:47      Subjective: Poor historian.  Alert and oriented x 1.  Denies complaints.  Discharge Exam:  Vitals:   03/05/23 1205 03/05/23 2045 03/06/23 0645 03/06/23 1338  BP: 133/61 (!) 141/71 (!) 114/56 (!) 141/69  Pulse: 78 77 78 90  Resp: Temp: (!) 97.5 F (36.4 C) 98.4 F (36.9 C) 98 F (36.7 C) 99.4 F (37.4 C)  TempSrc: Oral Oral Oral Oral  SpO2: 100% 100% 100% 99%  Weight:      Height:        General exam: Elderly female, moderately built, frail and chronically ill looking lying comfortably propped up in bed without distress.  Oral mucosa moist. Respiratory system: Remains clear to auscultation.  No increased work of breathing.  Cass oxygen cannula around her chin.  Oxygen saturation 94% on room air. Cardiovascular system: S1 & S2 heard, RRR. No JVD, murmurs, rubs, gallops or clicks. No pedal edema.  Telemetry discontinued 4/4. Gastrointestinal system: Abdomen is nondistended, soft and nontender. No organomegaly or masses felt. Normal bowel sounds heard. Central nervous system: Alert and oriented only to self. No focal neurological deficits.  No change. Extremities: Grade 5 x 5 power in right limbs, appears to have contractures and limited power in left limbs Skin: No rashes, lesions or ulcers Psychiatry: Judgement and insight impaired. Mood & affect flat mostly flat.     The results of significant diagnostics from this hospitalization (including imaging, microbiology, ancillary and laboratory) are listed below for reference.     Microbiology: No results found for this or any previous visit (from the past 240 hour(s)).   Labs: CBC: Recent Labs  Lab 03/04/23 1224 03/05/23 0500  WBC 8.4 7.1  NEUTROABS 4.7  --   HGB 9.3* 8.3*   HCT 31.7* 27.7*  MCV 91.1 89.6  PLT 314 295    Basic Metabolic Panel: Recent Labs  Lab 03/04/23 1224 03/05/23 0500 03/06/23 0514  NA 143 140 141  K 3.8 3.5 3.3*  CL 106 103 104  CO2 29 29 29   GLUCOSE 132* 139* 134*  BUN 27* 24* 14  CREATININE 0.63 0.49 0.52  CALCIUM 9.3 9.2 8.9  MG  --  2.2  --   PHOS  --  4.0  --     Liver Function Tests: Recent Labs  Lab 03/04/23 1224 03/05/23 0500  AST 21 17  ALT 15 15  ALKPHOS 49 44  BILITOT 0.2* 0.3  PROT 7.5 6.8  ALBUMIN 3.4* 3.2*    CBG: Recent Labs  Lab 03/05/23 1205 03/05/23 1644 03/05/23 2135 03/06/23 0759 03/06/23 1203  GLUCAP 178* 100* 156* 133* 134*     Urinalysis    Component Value Date/Time   COLORURINE YELLOW 03/04/2023 1225   APPEARANCEUR CLEAR 03/04/2023 1225   APPEARANCEUR Clear 01/27/2023 1517   LABSPEC 1.019 03/04/2023 1225   PHURINE 6.0 03/04/2023 1225   GLUCOSEU NEGATIVE 03/04/2023 1225   HGBUR NEGATIVE 03/04/2023 1225   BILIRUBINUR NEGATIVE 03/04/2023 1225   BILIRUBINUR Negative 01/27/2023 1517   KETONESUR NEGATIVE 03/04/2023 1225   PROTEINUR NEGATIVE 03/04/2023 1225   UROBILINOGEN 0.2 06/06/2022 1543   NITRITE NEGATIVE 03/04/2023 1225   LEUKOCYTESUR NEGATIVE 03/04/2023 1225      Time coordinating discharge: 25 minutes  SIGNED:  Marcellus Scott, MD,  FACP, FHM, SFHM, [facility], [facility]   [facility]     To contact the attending provider between 7A-7P or the covering provider during after hours 7P-7A, please log into the web site [email or web address] and access using universal Clint password for that web site. If you do not have the password, please call the hospital operator.

## 2023-03-11 ENCOUNTER — Encounter: Payer: Medicare PPO | Admitting: Registered Nurse

## 2023-03-16 ENCOUNTER — Encounter: Payer: Medicare PPO | Admitting: Registered Nurse

## 2023-03-19 ENCOUNTER — Encounter: Payer: Medicare PPO | Admitting: Neurosurgery

## 2023-03-19 NOTE — Progress Notes (Deleted)
Subjective:    Patient ID: Amanda Porter, female    DOB: 1946/10/11, 77 y.o.   MRN: [id number]  HPI   Pain Inventory Average Pain {NUMBERS; 0-10:5044} Pain Right Now {NUMBERS; 0-10:5044} My pain is {PAIN DESCRIPTION:21022940}  LOCATION OF PAIN  ***  BOWEL Number of stools per week: *** Oral laxative use {YES/NO:21197} Type of laxative *** Enema or suppository use {YES/NO:21197} History of colostomy {YES/NO:21197} Incontinent {YES/NO:21197}  BLADDER {bladder options:24190} In and out cath, frequency *** Able to self cath {YES/NO:21197} Bladder incontinence {YES/NO:21197} Frequent urination {YES/NO:21197} Leakage with coughing {YES/NO:21197} Difficulty starting stream {YES/NO:21197} Incomplete bladder emptying {YES/NO:21197}   Mobility {MOBILITY RWE:31540086}  Function {FUNCTION:21022946}  Neuro/Psych {NEURO/PSYCH:21022948}  Prior Studies {CPRM PRIOR STUDIES:21022953}  Physicians involved in your care {CPRM PHYSICIANS INVOLVED IN YOUR CARE:21022954}   Family History  Problem Relation Age of Onset   Pneumonia Mother    Cancer Father    Hyperlipidemia Sister    Hypertension Sister    Heart disease Sister    Diabetes Sister    Hypertension Sister    Diabetes Sister    Pancreatic cancer Sister    Throat cancer Brother    Cancer Brother    Cancer Brother    Breast cancer Neg Hx    Social History   Socioeconomic History   Marital status: Divorced    Spouse name: Not on file   Number of children: 1   Years of education: trade school   Highest education level: Not on file  Occupational History   Occupation: Retired  Tobacco Use   Smoking status: Former    Packs/day: 0.50    Years: 50.00    Additional pack years: 0.00    Total pack years: 25.00    Types: Cigarettes    Quit date: 08/01/2017    Years since quitting: 5.6    Passive exposure: Past   Smokeless tobacco: Never  Vaping Use   Vaping Use: Never used  Substance and Sexual Activity    Alcohol use: Not Currently   Drug use: Not Currently   Sexual activity: Not Currently  Other Topics Concern   Not on file  Social History Narrative   ** Merged History Encounter **       Moved from [place]. Lives with daughter. Right handed    Social Determinants of Health   Financial Resource Strain: Low Risk  (10/20/2022)   Overall Financial Resource Strain (CARDIA)    Difficulty of Paying Living Expenses: Not very hard  Food Insecurity: No Food Insecurity (12/23/2022)   Hunger Vital Sign    Worried About Running Out of Food in the Last Year: Never true    Ran Out of Food in the Last Year: Never true  Transportation Needs: No Transportation Needs (12/23/2022)   PRAPARE - Administrator, Civil Service (Medical): No    Lack of Transportation (Non-Medical): No  Physical Activity: Not on file  Stress: No Stress Concern Present (10/20/2022)   [employer] of Occupational Health - Occupational Stress Questionnaire    Feeling of Stress : Not at all  Social Connections: Not on file   Past Surgical History:  Procedure Laterality Date   BURR HOLE Right 02/03/2023   Procedure: BURR HOLE X 1, ventriculoperitoneal shunt removal;  Surgeon: Venetia Night, MD;  Location: [facility];  Service: Neurosurgery;  Laterality: Right;   ESOPHAGOGASTRODUODENOSCOPY (EGD) WITH PROPOFOL N/A 12/16/2022   Procedure: ESOPHAGOGASTRODUODENOSCOPY (EGD) WITH PROPOFOL;  Surgeon: Midge Minium, MD;  Location: [facility];  Service: Endoscopy;  Laterality: N/A;   HIP ARTHROPLASTY Right 09/07/2022   Procedure: ARTHROPLASTY BIPOLAR HIP (HEMIARTHROPLASTY);  Surgeon: Lyndle Herrlich, MD;  Location: [facility];  Service: Orthopedics;  Laterality: Right;   IR FL GUIDED LOC OF NEEDLE/CATH TIP FOR SPINAL INJECTION RT  07/25/2022   LUNG REMOVAL, PARTIAL     left upper lobe   SHUNT REVISION VENTRICULAR-PERITONEAL N/A 09/19/2022   Procedure: VENTRICULOPERITONEAL SHUNT REVISION;  Surgeon: Venetia Night,  MD;  Location: [facility];  Service: Neurosurgery;  Laterality: N/A;   VENTRICULOPERITONEAL SHUNT  12/09/2016   VENTRICULOPERITONEAL SHUNT  10/2018   Past Medical History:  Diagnosis Date   Acute respiratory failure requiring reintubation 11/05/2021   Anemia    Anxiety    Anxiety and depression    COPD (chronic obstructive pulmonary disease)    CVA (cerebral vascular accident)    CVA (cerebral vascular accident)    L sided deficits   Dementia    Depression    Diabetes mellitus    Type 2   Diabetes mellitus without complication    Dysphagia    Dysphasia    Falls    GERD (gastroesophageal reflux disease)    HCAP (healthcare-associated pneumonia) 12/16/2022   History of lung cancer    Hyperlipidemia    Hypertension    Influenza A 12/13/2021   Memory disturbance 11/20/2017   Normal pressure hydrocephalus 2018   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   There were no vitals taken for this visit.  Opioid Risk Score:   Fall Risk Score:  `1  Depression screen [facility] 2/9     01/22/2023   12:21 PM 10/20/2022   11:18 AM 03/14/2021    7:45 AM 03/26/2018    1:35 PM 03/02/2018   11:24 AM  Depression screen PHQ 2/9  Decreased Interest 3 0 Down, Depressed, Hopeless 3 0 PHQ - 2 Score 6 0 Altered sleeping 3  0 3 3  Tired, decreased energy Change in appetite 0  0 3 3  Feeling bad or failure about yourself  2  0 1 1  Trouble concentrating 3  0 0 1  Moving slowly or fidgety/restless 1  0 3 1  Suicidal thoughts 2  0 0 0  PHQ-9 Score Difficult doing work/chores Extremely dIfficult  Somewhat difficult Extremely dIfficult Extremely dIfficult    Review of Systems     Objective:   Physical Exam        Assessment &  Plan:

## 2023-03-20 ENCOUNTER — Encounter: Payer: Medicare PPO | Admitting: Registered Nurse

## 2023-03-24 NOTE — Telephone Encounter (Signed)
Completed and placed in Kelli's inbox. 

## 2023-03-24 NOTE — Telephone Encounter (Signed)
Placed in your box for review and completion

## 2023-03-27 ENCOUNTER — Encounter: Payer: Self-pay | Admitting: Oncology

## 2023-04-07 ENCOUNTER — Encounter: Payer: Medicare PPO | Admitting: Neurosurgery

## 2023-05-07 ENCOUNTER — Encounter: Payer: Medicare PPO | Admitting: Neurosurgery

## 2023-05-12 ENCOUNTER — Ambulatory Visit: Payer: Medicare PPO | Admitting: Physician Assistant

## 2023-05-22 ENCOUNTER — Encounter: Payer: Self-pay | Admitting: Oncology

## 2023-05-29 ENCOUNTER — Ambulatory Visit (HOSPITAL_COMMUNITY): Payer: Medicare PPO

## 2023-06-01 DEATH — deceased

## 2023-07-23 ENCOUNTER — Encounter: Payer: Medicare PPO | Admitting: Pharmacist
